# Patient Record
Sex: Male | Born: 1945 | Race: White | Hispanic: No | Marital: Married | Smoking: Former smoker
Health system: Southern US, Community
[De-identification: ages and names within clinical notes are randomized; demographics above are authoritative.]

## PROBLEM LIST (undated history)

## (undated) DIAGNOSIS — N183 Chronic kidney disease, stage 3 (moderate): Secondary | ICD-10-CM

## (undated) DIAGNOSIS — M069 Rheumatoid arthritis, unspecified: Secondary | ICD-10-CM

## (undated) DIAGNOSIS — I2510 Atherosclerotic heart disease of native coronary artery without angina pectoris: Secondary | ICD-10-CM

## (undated) DIAGNOSIS — Z8719 Personal history of other diseases of the digestive system: Secondary | ICD-10-CM

## (undated) DIAGNOSIS — R7303 Prediabetes: Secondary | ICD-10-CM

## (undated) DIAGNOSIS — I10 Essential (primary) hypertension: Secondary | ICD-10-CM

## (undated) DIAGNOSIS — Z972 Presence of dental prosthetic device (complete) (partial): Secondary | ICD-10-CM

## (undated) DIAGNOSIS — I440 Atrioventricular block, first degree: Secondary | ICD-10-CM

## (undated) DIAGNOSIS — E785 Hyperlipidemia, unspecified: Secondary | ICD-10-CM

## (undated) DIAGNOSIS — Z955 Presence of coronary angioplasty implant and graft: Secondary | ICD-10-CM

## (undated) DIAGNOSIS — I252 Old myocardial infarction: Secondary | ICD-10-CM

## (undated) DIAGNOSIS — I219 Acute myocardial infarction, unspecified: Secondary | ICD-10-CM

## (undated) DIAGNOSIS — R001 Bradycardia, unspecified: Secondary | ICD-10-CM

## (undated) DIAGNOSIS — N184 Chronic kidney disease, stage 4 (severe): Secondary | ICD-10-CM

## (undated) DIAGNOSIS — M1990 Unspecified osteoarthritis, unspecified site: Secondary | ICD-10-CM

## (undated) DIAGNOSIS — Z87442 Personal history of urinary calculi: Secondary | ICD-10-CM

## (undated) DIAGNOSIS — R17 Unspecified jaundice: Secondary | ICD-10-CM

## (undated) DIAGNOSIS — L4050 Arthropathic psoriasis, unspecified: Secondary | ICD-10-CM

## (undated) DIAGNOSIS — Z8711 Personal history of peptic ulcer disease: Secondary | ICD-10-CM

## (undated) DIAGNOSIS — C61 Malignant neoplasm of prostate: Secondary | ICD-10-CM

## (undated) HISTORY — PX: EYE SURGERY: SHX253

## (undated) HISTORY — PX: CARDIOVASCULAR STRESS TEST: SHX262

## (undated) HISTORY — DX: Chronic kidney disease, stage 3 (moderate): N18.3

## (undated) HISTORY — DX: Atherosclerotic heart disease of native coronary artery without angina pectoris: I25.10

## (undated) HISTORY — DX: Essential (primary) hypertension: I10

## (undated) HISTORY — DX: Hyperlipidemia, unspecified: E78.5

## (undated) HISTORY — DX: Bradycardia, unspecified: R00.1

## (undated) HISTORY — PX: COLONOSCOPY: SHX174

## (undated) SURGICAL SUPPLY — 2 items
WATCHMAN FLX PRO PROCEDURE (KITS) ×1 IMPLANT
WATCHMAN TRUSTEER PROCEDURE (KITS) ×1 IMPLANT

---

## 1997-12-16 HISTORY — PX: CORONARY ANGIOPLASTY WITH STENT PLACEMENT: SHX49

## 1998-11-14 ENCOUNTER — Encounter: Payer: Self-pay | Admitting: Cardiology

## 1998-11-15 ENCOUNTER — Inpatient Hospital Stay (HOSPITAL_COMMUNITY): Admission: AD | Admit: 1998-11-15 | Discharge: 1998-11-17 | Payer: Self-pay | Admitting: Cardiology

## 2002-06-04 ENCOUNTER — Encounter: Payer: Self-pay | Admitting: Family Medicine

## 2002-06-04 ENCOUNTER — Ambulatory Visit (HOSPITAL_COMMUNITY): Admission: RE | Admit: 2002-06-04 | Discharge: 2002-06-04 | Payer: Self-pay | Admitting: Family Medicine

## 2002-06-17 LAB — HM DIABETES EYE EXAM

## 2004-12-11 ENCOUNTER — Ambulatory Visit (HOSPITAL_COMMUNITY): Admission: RE | Admit: 2004-12-11 | Discharge: 2004-12-11 | Payer: Self-pay | Admitting: Family Medicine

## 2005-01-04 ENCOUNTER — Ambulatory Visit: Payer: Self-pay | Admitting: Cardiology

## 2005-12-20 ENCOUNTER — Ambulatory Visit: Payer: Self-pay | Admitting: Cardiology

## 2006-02-28 LAB — HM DIABETES EYE EXAM

## 2006-12-22 ENCOUNTER — Ambulatory Visit: Payer: Self-pay | Admitting: Cardiology

## 2007-12-25 ENCOUNTER — Ambulatory Visit: Payer: Self-pay | Admitting: Cardiology

## 2007-12-25 ENCOUNTER — Ambulatory Visit (HOSPITAL_COMMUNITY): Admission: RE | Admit: 2007-12-25 | Discharge: 2007-12-25 | Payer: Self-pay | Admitting: Cardiology

## 2009-01-05 ENCOUNTER — Ambulatory Visit: Payer: Self-pay | Admitting: Cardiology

## 2009-01-13 ENCOUNTER — Ambulatory Visit (HOSPITAL_COMMUNITY): Admission: RE | Admit: 2009-01-13 | Discharge: 2009-01-13 | Payer: Self-pay | Admitting: Cardiology

## 2009-04-14 ENCOUNTER — Encounter (INDEPENDENT_AMBULATORY_CARE_PROVIDER_SITE_OTHER): Payer: Self-pay | Admitting: *Deleted

## 2009-07-19 ENCOUNTER — Telehealth (INDEPENDENT_AMBULATORY_CARE_PROVIDER_SITE_OTHER): Payer: Self-pay

## 2009-09-08 ENCOUNTER — Ambulatory Visit (HOSPITAL_COMMUNITY): Admission: RE | Admit: 2009-09-08 | Discharge: 2009-09-08 | Payer: Self-pay | Admitting: General Surgery

## 2009-11-06 ENCOUNTER — Ambulatory Visit (HOSPITAL_COMMUNITY): Admission: RE | Admit: 2009-11-06 | Discharge: 2009-11-06 | Payer: Self-pay | Admitting: Nephrology

## 2009-11-22 ENCOUNTER — Ambulatory Visit (HOSPITAL_COMMUNITY): Admission: RE | Admit: 2009-11-22 | Discharge: 2009-11-22 | Payer: Self-pay | Admitting: Orthopedic Surgery

## 2009-11-28 ENCOUNTER — Encounter (INDEPENDENT_AMBULATORY_CARE_PROVIDER_SITE_OTHER): Payer: Self-pay | Admitting: *Deleted

## 2009-11-28 LAB — CONVERTED CEMR LAB
ALT: 59 units/L
AST: 15 units/L
Albumin: 3.5 g/dL
Alkaline Phosphatase: 103 units/L
BUN: 18 mg/dL
CO2: 26 meq/L
Calcium: 8.8 mg/dL
Chloride: 100 meq/L
Creatinine, Ser: 1.61 mg/dL
Glucose, Bld: 123 mg/dL
Potassium: 4.9 meq/L
Sodium: 140 meq/L
Total Protein: 6.9 g/dL

## 2009-12-07 ENCOUNTER — Encounter: Payer: Self-pay | Admitting: Cardiology

## 2010-01-02 ENCOUNTER — Encounter (INDEPENDENT_AMBULATORY_CARE_PROVIDER_SITE_OTHER): Payer: Self-pay | Admitting: *Deleted

## 2010-01-08 ENCOUNTER — Encounter: Payer: Self-pay | Admitting: Cardiology

## 2010-01-08 DIAGNOSIS — E785 Hyperlipidemia, unspecified: Secondary | ICD-10-CM | POA: Insufficient documentation

## 2010-01-08 DIAGNOSIS — E782 Mixed hyperlipidemia: Secondary | ICD-10-CM | POA: Insufficient documentation

## 2010-01-08 DIAGNOSIS — I10 Essential (primary) hypertension: Secondary | ICD-10-CM | POA: Insufficient documentation

## 2010-01-09 ENCOUNTER — Encounter (INDEPENDENT_AMBULATORY_CARE_PROVIDER_SITE_OTHER): Payer: Self-pay | Admitting: *Deleted

## 2010-01-12 ENCOUNTER — Ambulatory Visit: Payer: Self-pay | Admitting: Cardiology

## 2010-01-12 DIAGNOSIS — D649 Anemia, unspecified: Secondary | ICD-10-CM | POA: Insufficient documentation

## 2010-01-12 DIAGNOSIS — I2510 Atherosclerotic heart disease of native coronary artery without angina pectoris: Secondary | ICD-10-CM | POA: Insufficient documentation

## 2010-01-12 DIAGNOSIS — I495 Sick sinus syndrome: Secondary | ICD-10-CM | POA: Insufficient documentation

## 2010-01-25 ENCOUNTER — Encounter (INDEPENDENT_AMBULATORY_CARE_PROVIDER_SITE_OTHER): Payer: Self-pay | Admitting: *Deleted

## 2010-02-02 ENCOUNTER — Ambulatory Visit (HOSPITAL_COMMUNITY): Admission: RE | Admit: 2010-02-02 | Discharge: 2010-02-02 | Payer: Self-pay | Admitting: Rheumatology

## 2010-06-28 ENCOUNTER — Telehealth (INDEPENDENT_AMBULATORY_CARE_PROVIDER_SITE_OTHER): Payer: Self-pay | Admitting: *Deleted

## 2010-10-02 DIAGNOSIS — I25810 Atherosclerosis of coronary artery bypass graft(s) without angina pectoris: Secondary | ICD-10-CM | POA: Insufficient documentation

## 2010-12-14 ENCOUNTER — Encounter (INDEPENDENT_AMBULATORY_CARE_PROVIDER_SITE_OTHER): Payer: Self-pay | Admitting: *Deleted

## 2010-12-14 LAB — CONVERTED CEMR LAB
ALT: 30 units/L
AST: 20 units/L
Albumin: 3.6 g/dL
Basophils Absolute: 0 10*3/uL
Basophils Relative: 0 %
Creatinine, Ser: 1.64 mg/dL
Eosinophils Absolute: 0.5 10*3/uL
Eosinophils Relative: 7 %
HCT: 41.3 %
Hemoglobin: 14 g/dL
Lymphocytes Relative: 26 %
Lymphs Abs: 1.9 10*3/uL
MCHC: 33.9 g/dL
MCV: 93 fL
Monocytes Absolute: 1 10*3/uL
Monocytes Relative: 14 %
Platelets: 201 10*3/uL
RBC: 4.44 M/uL
RDW: 13 %
WBC: 7.2 10*3/uL

## 2010-12-31 ENCOUNTER — Encounter (INDEPENDENT_AMBULATORY_CARE_PROVIDER_SITE_OTHER): Payer: Self-pay | Admitting: *Deleted

## 2011-01-04 ENCOUNTER — Ambulatory Visit
Admission: RE | Admit: 2011-01-04 | Discharge: 2011-01-04 | Payer: Self-pay | Source: Home / Self Care | Attending: Cardiology | Admitting: Cardiology

## 2011-01-04 DIAGNOSIS — M069 Rheumatoid arthritis, unspecified: Secondary | ICD-10-CM | POA: Insufficient documentation

## 2011-01-05 ENCOUNTER — Encounter: Payer: Self-pay | Admitting: Cardiology

## 2011-01-05 LAB — CONVERTED CEMR LAB
ALT: 26 units/L (ref 0–53)
AST: 22 units/L (ref 0–37)
Albumin: 3.8 g/dL (ref 3.5–5.2)
Alkaline Phosphatase: 80 units/L (ref 39–117)
Bilirubin, Direct: 0.1 mg/dL (ref 0.0–0.3)
Cholesterol: 161 mg/dL (ref 0–200)
HDL: 36 mg/dL — ABNORMAL LOW (ref >39)
Indirect Bilirubin: 0.5 mg/dL (ref 0.0–0.9)
LDL Cholesterol: 81 mg/dL (ref 0–99)
Total Bilirubin: 0.6 mg/dL (ref 0.3–1.2)
Total CHOL/HDL Ratio: 4.5
Total Protein: 7.3 g/dL (ref 6.0–8.3)
Triglycerides: 219 mg/dL — ABNORMAL HIGH (ref <150)
VLDL: 44 mg/dL — ABNORMAL HIGH (ref 0–40)

## 2011-01-06 ENCOUNTER — Encounter: Payer: Self-pay | Admitting: Nephrology

## 2011-01-08 ENCOUNTER — Encounter (INDEPENDENT_AMBULATORY_CARE_PROVIDER_SITE_OTHER): Payer: Self-pay | Admitting: *Deleted

## 2011-01-15 NOTE — Assessment & Plan Note (Signed)
Summary: 1 YR F/UPER CHECKOUT ON 01/05/09/TG   Visit Type:  Follow-up Primary Provider:  Dr. Orson Ape   History of Present Illness: Jacob Farmer returns to the office as scheduled for continued assessment and treatment of coronary artery disease, now 20 years following initial presentation with myocardial infarction and 10 years after 2 vessel PCI with drug-eluting stents to the circumflex and RCA.  He continues to do very well with a fairly sedentary lifestyle, but no exercise related symptoms, no dyspnea, no chest discomfort, no syncope and no edema.  Unfortunately, he has had multiple medical issues in recent months including progression of chronic renal disease for which he now sees Dr. Lowanda Foster, a flare of arthritic discomfort when nonsteroidal agents were discontinued and now anemia that has developed over the past 2 months.  He was treated with prednisone for a period of time, but that was discontinued along with his aspirin.  He is also being followed by Dr. Estanislado Pandy for his rheumatologic problems.  Hemoglobin was normal in December, but 10.6 on 12/21/09.  MCV is normal.  Stool samples for Hemoccult testing had been obtained, but I am unaware of exactly how much evaluation has been performed and who is directing the assessment of this problem.  Has had a recent nonproductive cough, that has improved.  He denies melena, hematemesis or other apparent avenues for blood loss.  Current Medications (verified): 1)  Metoprolol Tartrate 50 Mg Tabs (Metoprolol Tartrate) .... Take 1 Tablet By Mouth Two Times A Day 2)  Simvastatin 40 Mg Tabs (Simvastatin) .... Take 1 Tab Daily 3)  Oxycodone-Acetaminophen 5-500 Mg Caps (Oxycodone-Acetaminophen) .... Take As Directed 4)  Ra Fish Oil 1000 Mg Caps (Omega-3 Fatty Acids) .... Take 1 Tab Two Times A Day 5)  Sulfasalazine 500 Mg Tabs (Sulfasalazine) .... Take 2 Tabs Two Times A Day  Allergies (verified): No Known Drug Allergies  Past History:  PMH, FH,  and Social History reviewed and updated.  Review of Systems       The patient complains of prolonged cough.  The patient denies fever, weight loss, weight gain, vision loss, decreased hearing, hoarseness, chest pain, syncope, dyspnea on exertion, peripheral edema, headaches, hemoptysis, abdominal pain, melena, and hematochezia.         See also history of present illness.  Vital Signs:  Patient profile:   65 year old male Height:      70 inches Weight:      182 pounds BMI:     26.21 Pulse rate:   75 / minute BP sitting:   142 / 81  (right arm)  Vitals Entered By: Doretha Sou, CNA (January 12, 2010 1:22 PM)  Physical Exam  General:   General-Well developed; no acute distress:   Neck-No JVD; no carotid bruits: Lungs-No tachypnea, no rales; no rhonchi; no wheezes: Cardiovascular-normal PMI; normal S1 and S2: Abdomen-BS normal; soft and non-tender without masses or organomegaly:  Musculoskeletal-No deformities, no cyanosis or clubbing; full range of motion in all joints without apparent effusions. 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  Ongoing treatment will focus on optimization of control of cardiovascular risk factors.  Problem # 2:  HYPERLIPIDEMIA (B2193296.4) We are collecting recent laboratory results from his primary care physician, nephrologist and rheumatologist.  Once the results of the lipid profile are  in hand, adjustment of his therapy will be undertaken as needed.  Problem # 3:  HYPERTENSION (ICD-401.9) Blood pressure control has been good; current medications will be continued.  Problem # 4:  CHRONIC KIDNEY DISEASE STAGE III (ICD-585.1) Renal function is stable and is being followed by Dr. Lowanda Foster.  Problem # 5:   ANEMIA (ICD-285.9) Onset is too sudden to be the result of his fairly mild chronic renal disease.  It appears that appropriate testing is being done-I will attempt to locate this and will otherwise not  become involved in this noncardiac issue.  I plan to reassess this nice gentleman in 6 months to be certain that aspirin has been resumed and that all other issues are stable.  Patient Instructions: 1)  Your physician recommends that you schedule a follow-up appointment in: 6 months Prescriptions: METOPROLOL TARTRATE 50 MG TABS (METOPROLOL TARTRATE) Take 1 tablet by mouth two times a day  #180 x 3   Entered by:   Tye Savoy RN   Authorized by:   Yehuda Savannah, MD, [facility]   Signed by:   Tye Savoy RN on 01/12/2010   Method used:   Electronically to        [facility] [address] (retail)       [address]       [place], [place]  [address]       Ph: [phone]       Fax: [phone]   RxID:   [id number]

## 2011-01-15 NOTE — Progress Notes (Signed)
Summary: NEEDS TO TALK WITH NURSE  Phone Note Call from Patient Call back at Home Phone [phone]   Caller: PT WIFE Reason for Call: Talk to Nurse Summary of Call: PER PT WIFE PT IS NOT GOING TO SEE DR ROTHBART EVERY SIX MONTH ONLY EVERY YEAR HE HAS OTHER DOCTORS VISITS IN BETWEEN WITH [facility] AND HAS LABS DONE. THEY ARE NEEDING HIS RX SIMVASTATIN CALLED IN TO [facility] FOR 3 MONTHS NOT FOR ONE MONTH.  AGAIN SHE STATES THAT HE WILL NOT COME FOR 6 MON ONLY FOR 1 YEAR F/U. Initial call taken by: Nevada Crane,  June 28, 2010 11:20 AM  Follow-up for Phone Call        Pt sees numerous specialists and will only come to see the cardiologist on an annual basis Follow-up by: Tye Savoy RN,  June 28, 2010 5:01 PM    Prescriptions: SIMVASTATIN 40 MG TABS (SIMVASTATIN) TAKE 1 TAB DAILY  #90 x 3   Entered by:   Tye Savoy RN   Authorized by:   Yehuda Savannah, MD, [facility]   Signed by:   Tye Savoy RN on 06/28/2010   Method used:   Electronically to        [facility] [phone]* (retail)       [address]       [place], [place]  [address]       Ph: [phone] or [phone]       Fax: [phone]   RxID:   [id number]

## 2011-01-15 NOTE — Miscellaneous (Signed)
Summary: LABS CMP,11/28/2009  Clinical Lists Changes  Observations: Added new observation of CALCIUM: 8.8 mg/dL (11/28/2009 9:15) Added new observation of ALBUMIN: 3.5 g/dL (11/28/2009 9:15) Added new observation of PROTEIN, TOT: 6.9 g/dL (11/28/2009 9:15) Added new observation of SGPT (ALT): 59 units/L (11/28/2009 9:15) Added new observation of SGOT (AST): 15 units/L (11/28/2009 9:15) Added new observation of ALK PHOS: 103 units/L (11/28/2009 9:15) Added new observation of CREATININE: 1.61 mg/dL (11/28/2009 9:15) Added new observation of BUN: 18 mg/dL (11/28/2009 9:15) Added new observation of BG RANDOM: 123 mg/dL (11/28/2009 9:15) Added new observation of CO2 PLSM/SER: 26 meq/L (11/28/2009 9:15) Added new observation of CL SERUM: 100 meq/L (11/28/2009 9:15) Added new observation of K SERUM: 4.9 meq/L (11/28/2009 9:15) Added new observation of NA: 140 meq/L (11/28/2009 9:15)

## 2011-01-15 NOTE — Letter (Signed)
Summary: Generic Letter, Intro to Referring  [facility]  [address]   [place], [place] [address]   Phone: [phone]  Fax: [phone]      January 25, 2010             RE: [name]   Aug 30, 1946                 [place], [place]  [address]  Dear, Appt Scheduler  You referred this patient to our office for a consult. We have tried to reach Mr Leverington by phone and mail. He has failed to contact us to schedule an appointment.    Sincerely,  [facility] Ph: [phone]   Fax: [phone]

## 2011-01-15 NOTE — Progress Notes (Signed)
**Note De-Identified Reyansh Kushnir Obfuscation** Summary: Labs  Phone Note Call from Patient Call back at Home Phone [phone]   Caller: Jacob Farmer Reason for Call: Talk to Nurse Summary of Call: PT WIFE CALLING WANT TO KNOW ABOUT LABS WORK NEEDING DONE. Initial call taken by: Nevada Crane,  July 19, 2009 10:01 AM  Follow-up for Phone Call        Patient's wife, Jacob Farmer, states that her husband recieved a lab order in the mail 2 days ago with Jacob Farmer name on it as the ordering provider. Mrs. Opheim states that she spoke with someone in our office and that from now on Jacob Farmer would like to only be seen by Jacob Farmer for his cardiac needs.  She also stated that her husband had labs drawn in January and April of this year. I explained to her that Jacob Farmer ordered these additional labs before he left this office because of Jacob Farmer lab results. Mrs. Farmer said that Jacob Farmer has a follow up here in January with Jacob Farmer and that if he wants these labs at that time the patient will have them drawn then.  Follow-up by: Jeani Hawking Shakeria Robinette LPN,  August  4, 624THL 4:41 PM

## 2011-01-15 NOTE — Letter (Signed)
Summary: Appointment Reminder  [facility]  [address]   [place], [place] [address]   Phone: [phone]  Fax: [phone]       January 02, 2010   Jacob Farmer [address] [place], [place]  [address] 09-03-1946    Dear Mr. VANWIE,  We have been unable to reach you by phone to schedule a follow up   appointment that was recommended for you by Dr. Oneida Alar. It is very   important that we reach you to schedule an appointment. We hope that you  allow [place] to participate in your health care needs. Please contact us at  [phone] at your earliest convenience to schedule your appointment.  Sincerely,    [facility] R. Garfield Cornea, M.D.    Caro Hight, M.D. Vickey Huger, FNP-BC    Neil Crouch, PA-C Phone: [phone]    Fax: [phone]

## 2011-01-15 NOTE — Miscellaneous (Signed)
Summary: refill  Clinical Lists Changes  Medications: Added new medication of LISINOPRIL 20 MG TABS (LISINOPRIL) Take one tablet by mouth daily - Signed Rx of LISINOPRIL 20 MG TABS (LISINOPRIL) Take one tablet by mouth daily;  #30 x 11;  Signed;  Entered by: Doretha Sou, CNA;  Authorized by: Yehuda Savannah, MD, [facility];  Method used: Electronically to [facility] [address], [place], [place], [place]  [address], Ph: [phone], Fax: [phone]    Prescriptions: LISINOPRIL 20 MG TABS (LISINOPRIL) Take one tablet by mouth daily  #30 x 11   Entered by:   Doretha Sou, CNA   Authorized by:   Yehuda Savannah, MD, [facility]   Signed by:   Doretha Sou, CNA on 04/14/2009   Method used:   Electronically to        [facility]  [address] (retail)       [address]       [place], [place]  [address]       Ph: [phone]       Fax: [phone]   RxID:   [id number]

## 2011-01-15 NOTE — Miscellaneous (Signed)
Summary: metoprolol refill  Clinical Lists Changes  Medications: Changed medication from METOPROLOL TARTRATE 50 MG TABS (METOPROLOL TARTRATE) to METOPROLOL TARTRATE 50 MG TABS (METOPROLOL TARTRATE) Take 1 tablet by mouth two times a day

## 2011-01-15 NOTE — Letter (Signed)
Summary: progress notes  progress notes   Imported By: Nevada Crane 01/26/2010 08:33:59  _____________________________________________________________________  External Attachment:    Type:   Image     Comment:   External Document

## 2011-01-17 NOTE — Letter (Signed)
Summary: [facility], general practice at [place]. [address]  [address]   Phone: [phone]  Fax: [phone]      January 08, 2011 MRN: [id number]   Jacob Farmer [address] [place],   [address]   Dear [name],  Your test ordered by Rande Lawman has been reviewed by your physician (or physician assistant) and was found to be normal or stable. Your physician (or physician assistant) felt no changes were needed at this time.  ____ Echocardiogram  ____ Cardiac Stress Test  _x___ Lab Work  ____ Peripheral vascular study of arms, legs or neck  ____ CT scan or X-ray  ____ Lung or Breathing test  ____ Other:  No change in medical treatment at this time, per Dr. Lattie Haw.  Enclosed is a copy of your labwork for your records.  Thank you, Aleshia Cartelli [facility] RN    Jacqulyn Ducking, MD, [facility] Gamer.C.Renella Cunas, MD, F.A.C.C Cristopher Peru, MD, F.A.C.C Rozann Lesches, MD, F.A.C.C Jenkins Rouge, MD, [facility] Gamer.C.C

## 2011-01-17 NOTE — Miscellaneous (Signed)
Summary: LABS CBCD 12/14/2010  Clinical Lists Changes  Observations: Added new observation of ALBUMIN: 3.6 g/dL (12/14/2010 15:08) Added new observation of SGPT (ALT): 30 units/L (12/14/2010 15:08) Added new observation of SGOT (AST): 20 units/L (12/14/2010 15:08) Added new observation of CREATININE: 1.64 mg/dL (12/14/2010 15:08) Added new observation of ABSOLUTE BAS: 0.0 K/uL (12/14/2010 15:08) Added new observation of BASOPHIL %: 0 % (12/14/2010 15:08) Added new observation of EOS ABSLT: 0.5 K/uL (12/14/2010 15:08) Added new observation of % EOS AUTO: 7 % (12/14/2010 15:08) Added new observation of ABSOLUTE MON: 1.0 K/uL (12/14/2010 15:08) Added new observation of MONOCYTE %: 14 % (12/14/2010 15:08) Added new observation of ABS LYMPHOCY: 1.9 K/uL (12/14/2010 15:08) Added new observation of LYMPHS %: 26 % (12/14/2010 15:08) Added new observation of PLATELETK/UL: 201 K/uL (12/14/2010 15:08) Added new observation of RDW: 13.0 % (12/14/2010 15:08) Added new observation of MCHC RBC: 33.9 g/dL (12/14/2010 15:08) Added new observation of MCV: 93.0 fL (12/14/2010 15:08) Added new observation of HCT: 41.3 % (12/14/2010 15:08) Added new observation of HGB: 14.0 g/dL (12/14/2010 15:08) Added new observation of RBC M/UL: 4.44 M/uL (12/14/2010 15:08) Added new observation of WBC COUNT: 7.2 10*3/microliter (12/14/2010 15:08)

## 2011-01-17 NOTE — Assessment & Plan Note (Addendum)
Summary: F1Y   Visit Type:  Follow-up Primary Provider:  Dr. Orson Ape   History of Present Illness: JacobJacob Farmer is a 65 y/o CM we are following for treatment and assessment of CAD with known history of MI in 1991, PCI using DES to Cx and RCA.  He has since been diagnosed with Rheumatoid arthritis and is being followed by Dr. Estanislado Pandy for this and started on Humira injections every two weeks. She follows labs for this monthly.  He was also diagnosed with renal insufficiency in the setting of antinflammatory medcations he was taking for arthritis and is being followed by Dr. Lowanda Foster.  There was a question of anemia in this setting but it has resolved. He had a colonoscopy and EDG 6 months ago and it was found to be normal without evidence of ulcerations or bleeding.  He is without cardiac complaint today.  He continues to work and be active.  He has gain 20lbs since being seen last.  He denies any symptoms of DOE or Chest discomfort.     Current Medications (verified): 1)  Metoprolol Tartrate 50 Mg Tabs (Metoprolol Tartrate) .... Take 1 Tablet By Mouth Two Times A Day 2)  Simvastatin 40 Mg Tabs (Simvastatin) .... Take 1 Tab Daily 3)  Ra Fish Oil 1000 Mg Caps (Omega-3 Fatty Acids) .... Take 1 Tab Two Times A Day 4)  Sulfasalazine 500 Mg Tabs (Sulfasalazine) .... Take 2 Tabs Two Times A Day 5)  Humira 40 Mg/0.30ml Kit (Adalimumab) .Marland Kitchen.. 1 Injection Every 2 Weeks Dr.deviswar 6)  Aspir-Low 81 Mg Tbec (Aspirin) .... Take 1 Tab Daily 7)  Norvasc 2.5 Mg Tabs (Amlodipine Besylate) .... Take 1 Tablet By Mouth Once Daily  Allergies (verified): No Known Drug Allergies  Comments:  Nurse/Medical Assistant: patient brought med list patient [facility] [place]  Past History:  Past medical, surgical, family and social histories (including risk factors) reviewed, and no changes noted (except as noted below).  Past Medical History: Reviewed history from 01/12/2010 and no changes  required. ASCVD: Acute myocardial infarction treated with TPA in 1990; 1999-stents to circumflex and RCA; residual total      occlusion of the left anterior descending; normal ejection fraction; stress nuclear in 2001-distal anteroseptal         ischemia; normal LV function Sinus bradycardia CHRONIC KIDNEY DISEASE STAGE III: Creatinine of 2.18 in 1/09 and 1.76 in 12/2008 HYPERLIPIDEMIA (ICD-272.4) HYPERTENSION (ICD-401.9) Tobacco abuse-discontinued in 1990  Past Surgical History: Reviewed history from 01/12/2010 and no changes required. None  Family History: Reviewed history from 01/12/2010 and no changes required. Mother died at age 73 due to myocardial infarction Father has history of coronary artery disease  Social History: Reviewed history from 01/12/2010 and no changes required. Retired from work as an Clinical biochemist Tobacco Use -remote Alcohol Use - 2-3 ounces per day Regular Exercise - no Drug Use - no Married with 2 children  Review of Systems       Kneee joint soreness  All other systems have been reviewed and are negative unless stated above.   Vital Signs:  Patient profile:   65 year old male Weight:      211 pounds BMI:     30.38 O2 Sat:      98 % on Room air Pulse rate:   56 / minute BP sitting:   152 / 73  (left arm)  Vitals Entered By: Doretha Sou, CNA (January 04, 2011 11:26 AM)  O2 Flow:  Room air  Physical  Exam  General:  Well developed, well nourished, in no acute distress. Head:  normocephalic and atraumatic Eyes:  PERRLA/EOM intact; conjunctiva and lids normal. Chest Wall:  no deformities or breast masses noted Lungs:  Clear bilaterally to auscultation and percussion. Heart:  Non-displaced PMI, chest non-tender; regular rate and rhythm, S1, S2 without murmurs, rubs or gallops. Carotid upstroke normal, no bruit. Normal abdominal aortic size, no bruits. Femorals normal pulses, no bruits. Pedals normal pulses. No edema, no varicosities. Abdomen:   Bowel sounds positive; abdomen soft and non-tender without masses, organomegaly, or hernias noted. No hepatosplenomegaly. Msk:  joint tenderness in knees bilaterally Pulses:  pulses normal in all 4 extremities Extremities:  No clubbing or cyanosis. Neurologic:  Alert and oriented x 3. Psych:  Normal affect.   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  No stress tests have been performed in about 5 years. He states that Dr. Lattie Haw determined that the scar tissue would be a factor in all future stress tests.  He is asymptomatic at this point.  He was also seen and examined by Dr. Lattie Haw on this clinic visit. We will refill his metoprolol.  Problem # 2:  HYPERTENSION (ICD-401.9) He is elevated on this visit for person with CAD.  Goal < 123456 systolic.  Review of prior BP demonstrated moderately controlled BP of142./81.  Will begin Norvasc 2.5 mg daily.  He is to take his BP at home and record it. He will see [place] in a month to report the results to the nurses. His updated medication list for this problem includes:    Metoprolol Tartrate 50 Mg Tabs (Metoprolol tartrate) .Marland Kitchen... Take 1 tablet by mouth two times a day    Aspir-low 81 Mg Tbec (Aspirin) .Marland Kitchen... Take 1 tab daily    Norvasc 2.5 Mg Tabs (Amlodipine besylate) .Marland Kitchen... Take 1 tablet by mouth once daily  Problem # 3:  HYPERLIPIDEMIA (ICD-272.4) Fasting lipids will be completed. His updated medication list for this problem includes:    Simvastatin 40 Mg Tabs (Simvastatin) .Marland Kitchen... Take 1 tab daily  Orders: T-Lipid Profile [id number]) T-Hepatic Function [id number])  Patient Instructions: 1)  Your physician recommends that you schedule a follow-up appointment in: 12 months 2)  Your physician recommends that you return for lab work in: next week 3)   Your physician has recommended you make the following change in your medication: start taking Norvasc 2.5mg  by mouth once daily  4)  Your physician has requested that you regularly monitor and record your blood pressure readings at home.  Please use the same machine at the same time of day to check your readings and record them to bring to your follow-up visit. 5)  ***Please drop your blood pressure diary off in the front office in  about 1 month *** Prescriptions: NORVASC 2.5 MG TABS (AMLODIPINE BESYLATE) take 1 tablet by mouth once daily  #90 x 3   Entered by:   Jeani Hawking Via LPN   Authorized by:   Yehuda Savannah, MD, [facility]   Signed by:   Jeani Hawking Via LPN on 075-GRM   Method used:   Print then Give to Patient   RxID:   [id number] METOPROLOL TARTRATE 50 MG TABS (METOPROLOL TARTRATE) Take 1 tablet by mouth two times a day  #180 x 3   Entered  by:   Jeani Hawking Via LPN   Authorized by:   Yehuda Savannah, MD, [facility]   Signed by:   Jeani Hawking Via LPN on 075-GRM   Method used:   Electronically to        [facility] (retail)       [address]       [place], [place]  [address]       Ph: [phone]       Fax: [phone]   RxID:   [id number]

## 2011-02-04 ENCOUNTER — Encounter: Payer: Self-pay | Admitting: Cardiology

## 2011-02-12 NOTE — Letter (Signed)
Summary: BP LOG  BP LOG   Imported By: Nevada Crane 02/04/2011 13:49:55  _____________________________________________________________________  External Attachment:    Type:   Image     Comment:   External Document

## 2011-03-22 LAB — BASIC METABOLIC PANEL
BUN: 28 mg/dL — ABNORMAL HIGH (ref 6–23)
CO2: 24 mEq/L (ref 19–32)
Calcium: 9 mg/dL (ref 8.4–10.5)
Chloride: 109 mEq/L (ref 96–112)
Creatinine, Ser: 2.12 mg/dL — ABNORMAL HIGH (ref 0.4–1.5)
GFR calc non Af Amer: 32 mL/min — ABNORMAL LOW (ref >60)
Glucose, Bld: 91 mg/dL (ref 70–99)
Potassium: 5.9 mEq/L — ABNORMAL HIGH (ref 3.5–5.1)
Sodium: 139 mEq/L (ref 135–145)

## 2011-03-22 LAB — POCT I-STAT 4, (NA,K, GLUC, HGB,HCT)
Glucose, Bld: 99 mg/dL (ref 70–99)
HCT: 38 % — ABNORMAL LOW (ref 39.0–52.0)
Hemoglobin: 12.9 g/dL — ABNORMAL LOW (ref 13.0–17.0)
Potassium: 5.4 mEq/L — ABNORMAL HIGH (ref 3.5–5.1)
Sodium: 139 mEq/L (ref 135–145)

## 2011-03-22 LAB — CBC
HCT: 34.2 % — ABNORMAL LOW (ref 39.0–52.0)
Hemoglobin: 11.7 g/dL — ABNORMAL LOW (ref 13.0–17.0)
MCHC: 34.3 g/dL (ref 30.0–36.0)
MCV: 91.4 fL (ref 78.0–100.0)
Platelets: 181 10*3/uL (ref 150–400)
RBC: 3.74 MIL/uL — ABNORMAL LOW (ref 4.22–5.81)
RDW: 13.1 % (ref 11.5–15.5)
WBC: 9.7 10*3/uL (ref 4.0–10.5)

## 2011-04-30 NOTE — Letter (Signed)
December 25, 2007    Leonides Grills, M.D.  [address] [place], [place] [address]   RE:  Jacob Farmer, Jacob Farmer  MRN:  [id number]  /  DOB:  1946-10-26   Dear Rush Landmark:   Jacob Farmer returns to the office as scheduled for continued assessment  and treatment of coronary disease and cardiovascular risk factors.  Since the last visit, he has done well.  He has not required any urgent  medical care.  He remains active around his house including cutting down  trees without cardiopulmonary symptoms.  He has had a chronic cough for  the past month that is nonproductive.  He has not noted fever nor  chills.  He has had mild rhinitis and no pharyngitis.  He notes some  wheezing at night when he retires, but no dyspnea, orthopnea nor PND.   CURRENT MEDICATIONS:  1. Aspirin 81 mg daily.  2. Metoprolol 50 mg b.i.d.  3. Fish oil 1000 mg b.i.d.  4. Lisinopril 20 mg daily.  5. Voltaren 75 mg daily.  6. Simvastatin 20 mg daily.  In the past, he appeared to be taking      more simvastatin.   PHYSICAL EXAMINATION:  GENERAL:  Pleasant gentleman in no acute  distress.  VITAL SIGNS:  The weight is 197, 6 pounds less than last year.  Blood  pressure 135/80, heart rate 66 and regular, respirations 16.  NECK:  No jugular venous distention; normal carotid upstrokes without  bruits.  LUNGS:  Minor end-expiratory wheezing.  CARDIAC:  Distant first and second heart sounds; fourth heart sound  present.  Normal PMI.  ABDOMEN:  Soft and nontender; no bruits; no organomegaly.  EXTREMITIES:  Distal pulses intact; no edema.   LABORATORY DATA:  Lipid profile from last year was somewhat suboptimal  with a total cholesterol of 173, HDL 45 and LDL of 97.  Chemistry  profile was normal.   IMPRESSION:  Jacob Farmer is doing generally well.  His chronic  respiratory symptoms are probably related to a bronchitis or URI that he  suffered a few weeks ago.  We will proceed with a chest x-ray.  A  chemistry profile will be  obtained.  He will increase simvastatin to 40  mg daily and obtain a lipid profile in 1 month.  I will see this nice  gentleman again in 1 year and manage any issues raised by his laboratory  studies via telephone.    Sincerely,      Cristopher Estimable. Lattie Haw, MD, [facility]  Electronically Signed    RMR/MedQ  DD: 12/25/2007  DT: 12/25/2007  Job #: [id number]

## 2011-04-30 NOTE — Assessment & Plan Note (Signed)
[facility] OFFICE NOTE   Jacob Farmer, Jacob Farmer                       MRN:          [id number]  DATE:01/05/2009                            DOB:          Dec 24, 1945    CARDIOLOGIST:  Cristopher Estimable. Lattie Haw, MD, [facility]   PRIMARY CARE PHYSICIAN:  Leonides Grills, MD   REASON FOR VISIT:  One-year followup.   HISTORY OF PRESENT ILLNESS:  Jacob Farmer is a 65 year old male patient  with a history of coronary artery disease, status post anterior wall  myocardial infarction, treated with TPA in 1990, who subsequently  underwent percutaneous coronary intervention with stenting of the  circumflex and RCA in 1999.  He has a known totally occluded LAD.  He  returns today for annual followup.  Overall, he is doing well without  any chest pain or short of breath.  Denies any orthopnea, PND, or pedal  edema.  Denies syncope, near-syncope, or palpitations.  He does note  some calf pain from time to time.  He has taken some over-the-counter  potassium supplement on occasion.  This has helped somewhat with his  symptoms.  His symptoms usually occur when he has been on his feet for  prolonged periods of time.   CURRENT MEDICATIONS:  1. Aspirin 81 mg daily.  2. Lopressor 50 mg b.i.d.  3. Fish oil 1 g b.i.d.  4. Lisinopril 20 mg daily.  5. Voltaren 75 mg b.i.d.  6. Simvastatin 40 mg nightly.  7. Nitroglycerin p.r.n. chest pain.   PHYSICAL EXAMINATION:  GENERAL:  He is a well-nourished and well-  developed male in no distress.  VITAL SIGNS:  Blood pressure is 129/65, pulse 44, and weight 194 pounds.  HEENT:  Normal.  NECK:  Without JVD.  CARDIAC:  Normal S1 and S2.  Regular rate and rhythm.  LUNGS:  Clear to auscultation bilaterally.  ABDOMEN:  Soft and nontender.  EXTREMITIES:  Without edema.  NEUROLOGIC:  He is alert and oriented x3.  Cranial nerves II through XII  are grossly intact.  VASCULAR:  No carotid bruits  noted bilaterally.  Distal pulses are  difficult to palpate.  Femoral artery pulses are difficult to palpate  without bruits bilaterally.   Electrocardiogram demonstrates sinus bradycardia with a heart rate of  45, normal axis, and no acute changes.   ASSESSMENT AND PLAN:  1. Coronary artery disease, status post anterior wall myocardial      infarction in 1990, treated with tPA and subsequent percutaneous      coronary intervention with stenting of the circumflex and right      coronary artery in 1999, and overall preserved left ventricular      function.  The patient is doing well without any anginal symptoms.      He is on a good medical regimen.  We will refill his nitroglycerin      for him today.  We will pursue annual followup.  2. Lower extremity pain.  The patient describes some symptoms that are      questionable for claudication.  He certainly is at risk for  peripheral vascular disease, given his coronary artery disease.  We      will proceed with ankle-brachial indices and arterial Dopplers to      rule-out significant peripheral arterial disease.  3. Dyslipidemia.  He continues on simvastatin 40 mg nightly.  We will      set him up for repeat fasting lipids and CMET.  4. Sinus bradycardia.  He is tolerating this well and is asymptomatic.      No medication changes will be made today.   DISPOSITION:  The patient will follow up with Dr. Lattie Haw in 1 year or  sooner p.r.n.  Of note, the patient was also interviewed and examined by  Dr. Lattie Haw today.      Richardson Dopp, PA-C  Electronically Signed      Cristopher Estimable. Lattie Haw, MD, [facility]  Electronically Signed   SW/MedQ  DD: 01/05/2009  DT: 01/06/2009  Job #: [id number]   cc:   Leonides Grills, M.D.

## 2011-05-03 NOTE — Letter (Signed)
December 22, 2006    Leonides Grills, M.D.  [address],  [place] [address]   RE:  THAD, DERICK  MRN:  [id number]  /  DOB:  18-Sep-1946   Dear Rush Landmark,   Mr. Kishbaugh returns to the office for continued assessment and treatment  of coronary disease and cardiovascular risk factors.  He has not had any  clinical symptomatology since the late 90s.  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  His blood pressure and  hyperlipidemia have been well-controlled.   CURRENT MEDICATIONS:  1. Aspirin 81 mg daily.  2. Metoprolol 50 mg b.i.d.  3. Fish oil 1000 mg b.i.d.  4. Lisinopril 20 mg daily.  5. Simvastatin 80 mg daily.  6. Voltaren 75 mg b.i.d.   PHYSICAL EXAMINATION:  GENERAL:  Pleasant gentleman in no acute  distress.  VITAL SIGNS:  The weight is 203 - stable.  Blood pressure 135/75, heart  rate 55 and regular, respirations 16.  NECK:  No jugular venous distension; normal carotid upstrokes without  bruits.  LUNGS:  Clear.  CARDIAC:  Distant first and second heart sounds.  ABDOMEN:  Soft and nontender; no bruit; no organomegaly; no masses;  aortic pulsation not palpable.  EXTREMITIES:  No edema; distal pulses intact.   IMPRESSION:  Mr. Throop is doing generally well.  He was mildly  bradycardic with metoprolol, but he is asymptomatic.  We will probably  need to reduce dosage at some point.  He has not routinely taken  pneumococcal vaccine nor influenza vaccine.  This can be deferred for a  few years.  We will recheck a chemistry profile and a lipid profile.  If  results are good, I will plan to see this nice gentleman again in one  year.    Sincerely,      Cristopher Estimable. Lattie Haw, MD, [facility]  Electronically Signed    RMR/MedQ  DD: 12/22/2006  DT: 12/22/2006  Job #: [id number]

## 2011-06-27 ENCOUNTER — Other Ambulatory Visit: Payer: Self-pay | Admitting: Cardiology

## 2011-06-28 ENCOUNTER — Other Ambulatory Visit: Payer: Self-pay

## 2011-06-28 MED ORDER — SIMVASTATIN 40 MG PO TABS: 40.0000 mg | ORAL_TABLET | Freq: Every day | ORAL | Status: DC

## 2011-12-16 ENCOUNTER — Encounter: Payer: Self-pay | Admitting: Cardiology

## 2011-12-27 ENCOUNTER — Encounter: Payer: Self-pay | Admitting: Cardiology

## 2011-12-27 ENCOUNTER — Ambulatory Visit (INDEPENDENT_AMBULATORY_CARE_PROVIDER_SITE_OTHER): Payer: BC Managed Care – PPO | Admitting: Cardiology

## 2011-12-27 DIAGNOSIS — I2510 Atherosclerotic heart disease of native coronary artery without angina pectoris: Secondary | ICD-10-CM

## 2011-12-27 DIAGNOSIS — E1100 Type 2 diabetes mellitus with hyperosmolarity without nonketotic hyperglycemic-hyperosmolar coma (NKHHC): Secondary | ICD-10-CM | POA: Insufficient documentation

## 2011-12-27 DIAGNOSIS — N183 Chronic kidney disease, stage 3 unspecified: Secondary | ICD-10-CM

## 2011-12-27 DIAGNOSIS — N1831 Chronic kidney disease, stage 3a: Secondary | ICD-10-CM | POA: Insufficient documentation

## 2011-12-27 DIAGNOSIS — E119 Type 2 diabetes mellitus without complications: Secondary | ICD-10-CM

## 2011-12-27 DIAGNOSIS — E1122 Type 2 diabetes mellitus with diabetic chronic kidney disease: Secondary | ICD-10-CM | POA: Insufficient documentation

## 2011-12-27 DIAGNOSIS — D649 Anemia, unspecified: Secondary | ICD-10-CM

## 2011-12-27 DIAGNOSIS — E785 Hyperlipidemia, unspecified: Secondary | ICD-10-CM

## 2011-12-27 DIAGNOSIS — I10 Essential (primary) hypertension: Secondary | ICD-10-CM

## 2011-12-27 DIAGNOSIS — N184 Chronic kidney disease, stage 4 (severe): Secondary | ICD-10-CM | POA: Insufficient documentation

## 2011-12-27 MED ORDER — LISINOPRIL-HYDROCHLOROTHIAZIDE 20-12.5 MG PO TABS: 1.0000 | ORAL_TABLET | Freq: Every day | ORAL | Status: DC

## 2011-12-27 MED ORDER — SIMVASTATIN 40 MG PO TABS: 40.0000 mg | ORAL_TABLET | Freq: Every day | ORAL | Status: DC

## 2011-12-27 MED ORDER — METOPROLOL TARTRATE 50 MG PO TABS: 50.0000 mg | ORAL_TABLET | Freq: Two times a day (BID) | ORAL | Status: DC

## 2011-12-27 NOTE — Progress Notes (Signed)
Patient ID: Jacob Farmer, male   DOB: 05/24/1946, 66 y.o.   MRN: [id number] HPI: Scheduled return visit for this very nice gentleman with coronary artery disease and multiple cardiovascular risk factors.  Since his last visit, he has continued to do extremely well with no cardiopulmonary symptoms.  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  He does not pursue regular physical activity, but denies exertional dyspnea, chest discomfort, lightheadedness, syncope, pedal edema, palpitations, claudication, orthopnea or PND.  He checks blood pressure occasionally in a pharmacy and has noted systolics above [id number].  Serum glucose and hemoglobin A1c have apparently been mildly elevated prompting initiation of pharmacologic therapy.  CBGs at home are typically less than 120, and hemoglobin A1c is reported to be less than 6.  Prior to Admission medications   Medication Sig Start Date End Date Taking? Authorizing Provider  adalimumab (HUMIRA) 40 MG/0.8ML injection Inject 40 mg into the skin every 14 (fourteen) days.     Yes Historical Provider, MD  amLODipine (NORVASC) 2.5 MG tablet Take 2.5 mg by mouth daily.     Yes Historical Provider, MD  aspirin 81 MG tablet Take 81 mg by mouth daily.     Yes Historical Provider, MD  fish oil-omega-3 fatty acids 1000 MG capsule Take 1 capsule by mouth 2 (two) times daily.     Yes Historical Provider, MD  glimepiride (AMARYL) 1 MG tablet Take 1 mg by mouth daily before breakfast.   Yes Historical Provider, MD  metoprolol (LOPRESSOR) 50 MG tablet Take 50 mg by mouth 2 (two) times daily.     Yes Historical Provider, MD  simvastatin (ZOCOR) 40 MG tablet Take 1 tablet (40 mg total) by mouth at bedtime. 06/28/11 06/27/12 Yes Cristopher Estimable. Lattie Haw, MD   No Known Allergies    Past medical history, social history, and family history reviewed and updated.  ROS: See history of present illness.  PHYSICAL  EXAM: BP 154/80  Pulse 52  Resp 16  Ht 5\' 10"  (1.778 m)  Wt 93.441 kg (206 lb)  BMI 29.56 kg/m2  General-Well developed; no acute distress Body habitus-proportionate weight and height Neck-No JVD; no carotid bruits Lungs-clear lung fields; resonant to percussion Cardiovascular-normal PMI; normal S1 and S2 Abdomen-normal bowel sounds; soft and non-tender without masses or organomegaly Musculoskeletal-No deformities, no cyanosis or clubbing Neurologic-Normal cranial nerves; symmetric strength and tone Skin-Warm, no significant lesions Extremities-1-2+ distal pulses; no edema  EKG: normal sinus rhythm, leftward axis, first-degree AV block, otherwise normal.  No previous tracings for comparison.  ASSESSMENT AND PLAN:  Jacqulyn Ducking, MD 12/27/2011 11:04 AM

## 2011-12-27 NOTE — Assessment & Plan Note (Addendum)
The patient has been asymptomatic with respect to coronary disease for at least the past 10 years.  We will continue to encourage him to be as active as possible and to control cardiovascular risk factors optimally.

## 2011-12-27 NOTE — Assessment & Plan Note (Signed)
Anemia appears to have resolved spontaneously.  Most recent CBC was normal in 11/2010.

## 2011-12-27 NOTE — Assessment & Plan Note (Signed)
With diabetes and renal insufficiency, treatment with ACE inhibitor or ARB may be useful.  Lisinopril will be added for better control blood pressure and preservation of renal function.

## 2011-12-27 NOTE — Assessment & Plan Note (Signed)
Control of lipids is excellent; current therapy will be continued.

## 2011-12-27 NOTE — Assessment & Plan Note (Addendum)
Control of hypertension is slightly suboptimal.  Lisinopril/HCT 20/12.5 mg will be added to his medical regime with monitoring of renal function and electrolytes.

## 2011-12-27 NOTE — Patient Instructions (Signed)
Your physician recommends that you schedule a follow-up appointment in: 1 year  Your physician has recommended you make the following change in your medication:  1 - STOP Amlodipine 2 - START Lisinopril/HCT 20/12.5 mg daily  Your physician recommends that you return for lab work in: 1 month and you will receive a letter in the mail

## 2011-12-31 ENCOUNTER — Encounter: Payer: Self-pay | Admitting: *Deleted

## 2011-12-31 ENCOUNTER — Other Ambulatory Visit: Payer: Self-pay | Admitting: *Deleted

## 2012-01-01 ENCOUNTER — Telehealth: Payer: Self-pay | Admitting: Cardiology

## 2012-01-01 NOTE — Telephone Encounter (Signed)
WIFE STOPPED IN TO LET us KNOW THAT THE KIDNEY DOCTOR TOOK PT OFF LISINOPRIL.  ALSO NEED TO KNOW IF HE CAN SWITCH FROM SIMVASTATIN TO A GEN FROM [facility] DUE TO COST([facility] SELECTION IS LOVASTATIN ANS PRAVASTATIN.)  CELL PHONE IS [phone]

## 2012-01-02 ENCOUNTER — Other Ambulatory Visit: Payer: Self-pay | Admitting: *Deleted

## 2012-01-02 ENCOUNTER — Encounter: Payer: Self-pay | Admitting: *Deleted

## 2012-01-02 DIAGNOSIS — E782 Mixed hyperlipidemia: Secondary | ICD-10-CM

## 2012-01-02 DIAGNOSIS — I10 Essential (primary) hypertension: Secondary | ICD-10-CM

## 2012-01-02 MED ORDER — PRAVASTATIN SODIUM 80 MG PO TABS: 80.0000 mg | ORAL_TABLET | Freq: Every day | ORAL | Status: DC

## 2012-01-02 NOTE — Telephone Encounter (Signed)
Monitor home blood pressures; call for significant elevations Change simvastatin to pravastatin 80 mg per day. BMet and fasting lipid profile in 2 months.

## 2012-01-02 NOTE — Telephone Encounter (Signed)
Wife notified of Dr Izell [place] recommendations.

## 2012-01-03 ENCOUNTER — Telehealth: Payer: Self-pay | Admitting: Cardiology

## 2012-01-03 NOTE — Telephone Encounter (Signed)
Jacob Farmer asking about change in medication from [facility] 40mg , and now has been given Pravachol 80mg .  There is a significant price different.  This comes in 10, 20 and 40 mg for a 90 days fpr $10.  Can this be changed?

## 2012-01-05 NOTE — Telephone Encounter (Signed)
Please determine from insurer which statins can be provided at a cost similar to simvastatin.  If pravastatin costs $10 for a 3 month supply of 40mg  tabs then 80mg  per day should cost $20 for 3 months.  That should be doable.

## 2012-01-06 ENCOUNTER — Other Ambulatory Visit: Payer: Self-pay | Admitting: *Deleted

## 2012-01-06 ENCOUNTER — Telehealth: Payer: Self-pay | Admitting: *Deleted

## 2012-01-06 MED ORDER — PRAVASTATIN SODIUM 40 MG PO TABS: 80.0000 mg | ORAL_TABLET | Freq: Every day | ORAL | Status: DC

## 2012-01-06 NOTE — Telephone Encounter (Signed)
Advised wife that a script was sent for 40 mg tablets 2 tablets at bed time.

## 2012-01-10 LAB — HM DIABETES EYE EXAM

## 2012-01-21 ENCOUNTER — Encounter: Payer: Self-pay | Admitting: Cardiology

## 2012-02-05 ENCOUNTER — Other Ambulatory Visit: Payer: Self-pay | Admitting: *Deleted

## 2012-02-05 DIAGNOSIS — I10 Essential (primary) hypertension: Secondary | ICD-10-CM

## 2012-02-05 DIAGNOSIS — E782 Mixed hyperlipidemia: Secondary | ICD-10-CM

## 2012-02-15 ENCOUNTER — Other Ambulatory Visit: Payer: Self-pay | Admitting: Cardiology

## 2012-02-15 LAB — BASIC METABOLIC PANEL
BUN: 21 mg/dL (ref 6–23)
CO2: 26 mEq/L (ref 19–32)
Calcium: 9.4 mg/dL (ref 8.4–10.5)
Chloride: 104 mEq/L (ref 96–112)
Creat: 1.57 mg/dL — ABNORMAL HIGH (ref 0.50–1.35)
Glucose, Bld: 97 mg/dL (ref 70–99)
Potassium: 4.7 mEq/L (ref 3.5–5.3)
Sodium: 140 mEq/L (ref 135–145)

## 2012-02-17 ENCOUNTER — Encounter: Payer: Self-pay | Admitting: *Deleted

## 2012-02-21 ENCOUNTER — Other Ambulatory Visit: Payer: Self-pay | Admitting: *Deleted

## 2012-02-21 DIAGNOSIS — E782 Mixed hyperlipidemia: Secondary | ICD-10-CM

## 2012-02-21 DIAGNOSIS — I10 Essential (primary) hypertension: Secondary | ICD-10-CM

## 2012-03-02 ENCOUNTER — Encounter: Payer: Self-pay | Admitting: *Deleted

## 2012-06-27 ENCOUNTER — Other Ambulatory Visit: Payer: Self-pay | Admitting: Cardiology

## 2012-12-25 ENCOUNTER — Ambulatory Visit: Payer: BC Managed Care – PPO | Admitting: Cardiology

## 2013-01-13 ENCOUNTER — Ambulatory Visit: Payer: BC Managed Care – PPO | Admitting: Cardiology

## 2013-01-22 ENCOUNTER — Ambulatory Visit: Payer: BC Managed Care – PPO | Admitting: Cardiology

## 2013-04-02 ENCOUNTER — Other Ambulatory Visit: Payer: Self-pay | Admitting: Cardiology

## 2013-04-02 ENCOUNTER — Encounter: Payer: Self-pay | Admitting: Cardiology

## 2013-04-02 ENCOUNTER — Ambulatory Visit (INDEPENDENT_AMBULATORY_CARE_PROVIDER_SITE_OTHER): Payer: BC Managed Care – PPO | Admitting: Cardiology

## 2013-04-02 VITALS — BP 150/76 | HR 52 | Ht 70.0 in | Wt 212.0 lb

## 2013-04-02 DIAGNOSIS — I7090 Unspecified atherosclerosis: Secondary | ICD-10-CM

## 2013-04-02 DIAGNOSIS — N183 Chronic kidney disease, stage 3 unspecified: Secondary | ICD-10-CM

## 2013-04-02 DIAGNOSIS — E119 Type 2 diabetes mellitus without complications: Secondary | ICD-10-CM

## 2013-04-02 DIAGNOSIS — I2510 Atherosclerotic heart disease of native coronary artery without angina pectoris: Secondary | ICD-10-CM

## 2013-04-02 DIAGNOSIS — E785 Hyperlipidemia, unspecified: Secondary | ICD-10-CM

## 2013-04-02 DIAGNOSIS — I495 Sick sinus syndrome: Secondary | ICD-10-CM

## 2013-04-02 DIAGNOSIS — M069 Rheumatoid arthritis, unspecified: Secondary | ICD-10-CM

## 2013-04-02 DIAGNOSIS — I10 Essential (primary) hypertension: Secondary | ICD-10-CM

## 2013-04-02 MED ORDER — AMLODIPINE BESYLATE 5 MG PO TABS: 5.0000 mg | ORAL_TABLET | Freq: Every day | ORAL | Status: DC

## 2013-04-02 MED ORDER — LISINOPRIL 5 MG PO TABS: 5.0000 mg | ORAL_TABLET | Freq: Every day | ORAL | Status: DC

## 2013-04-02 NOTE — Assessment & Plan Note (Signed)
A urinalysis and measurement of microalbumin will be performed. Assuming increased albumin excretion is present, lisinopril will be added to patient's medical regime at low dose.

## 2013-04-02 NOTE — Assessment & Plan Note (Signed)
Blood pressure control is suboptimal, and renal disease would likely benefit from addition of an Ace inhibitor or ARB. Dose of amlodipine will be increased to 5 mg per day. Lisinopril will be added at a dose of 5 mg per day and renal function and electrolytes monitored. A 24-hour ambulatory blood pressure monitor will be provided once therapy has stabilized.

## 2013-04-02 NOTE — Progress Notes (Deleted)
Name: Jacob Farmer    DOB: [birth year]  Age: 67 y.o.  MR#: [id number]       PCP:  Leonides Grills, MD      Insurance: Payor: BLUE [employer]UE [employer]IELD  Plan: BCBS  PPO  Product Type: *No Product type*    CC:   No chief complaint on file. LIST   VS Filed Vitals:   04/02/13 1114  BP: 150/76  Pulse: 52  Height: 5\' 10"  (1.778 m)  Weight: 212 lb (96.163 kg)  SpO2: 99%    Weights Current Weight  04/02/13 212 lb (96.163 kg)  12/27/11 206 lb (93.441 kg)  01/04/11 211 lb (95.709 kg)    Blood Pressure  BP Readings from Last 3 Encounters:  04/02/13 150/76  12/27/11 154/80  01/04/11 152/73     Admit date:  (Not on file) Last encounter with RMR:  01/22/2013   Allergy Review of patient's allergies indicates no known allergies.  Current Outpatient Prescriptions  Medication Sig Dispense Refill  . adalimumab (HUMIRA) 40 MG/0.8ML injection Inject 40 mg into the skin every 14 (fourteen) days.        Marland Kitchen amLODipine (NORVASC) 2.5 MG tablet Take 2.5 mg by mouth daily.       Marland Kitchen aspirin 81 MG tablet Take 81 mg by mouth every other day.       . fish oil-omega-3 fatty acids 1000 MG capsule Take 1 capsule by mouth 2 (two) times daily.        Marland Kitchen glimepiride (AMARYL) 1 MG tablet Take 1 mg by mouth daily before breakfast.      . metoprolol (LOPRESSOR) 50 MG tablet TAKE ONE TABLET BY MOUTH TWICE DAILY  60 tablet  6  . pravastatin (PRAVACHOL) 40 MG tablet Take 2 tablets (80 mg total) by mouth daily.  180 tablet  3   No current facility-administered medications for this visit.    Discontinued Meds:   There are no discontinued medications.  Patient Active Problem List  Diagnosis  . HYPERLIPIDEMIA  . ANEMIA  . Hypertension  . SINUS BRADYCARDIA  . Arteriosclerotic cardiovascular disease (ASCVD)  . RHEUMATOID ARTHRITIS  . Chronic kidney disease, stage 3, mod decreased GFR  . Diabetes mellitus    LABS    Component Value Date/Time   NA 140 02/15/2012 0945   NA 140 11/28/2009   NA 139 09/08/2009  0730   K 4.7 02/15/2012 0945   K 4.9 11/28/2009   K 5.4* 09/08/2009 0730   CL 104 02/15/2012 0945   CL 100 11/28/2009   CL 109 09/06/2009 0912   CO2 26 02/15/2012 0945   CO2 26 11/28/2009   CO2 24 09/06/2009 0912   GLUCOSE 97 02/15/2012 0945   GLUCOSE 123 11/28/2009   GLUCOSE 99 09/08/2009 0730   BUN 21 02/15/2012 0945   BUN 18 11/28/2009   BUN 28* 09/06/2009 0912   CREATININE 1.57* 02/15/2012 0945   CREATININE 1.64 12/14/2010   CREATININE 1.61 11/28/2009   CREATININE 2.12* 09/06/2009 0912   CALCIUM 9.4 02/15/2012 0945   CALCIUM 8.8 11/28/2009   CALCIUM 9.0 09/06/2009 0912   GFRNONAA 32* 09/06/2009 0912   GFRAA  Value: 38        The eGFR has been calculated using the MDRD equation. This calculation has not been validated in all clinical situations. eGFR's persistently <60 mL/min signify possible Chronic Kidney Disease.* 09/06/2009 0912   CMP     Component Value Date/Time   NA 140 02/15/2012 0945   K  4.7 02/15/2012 0945   CL 104 02/15/2012 0945   CO2 26 02/15/2012 0945   GLUCOSE 97 02/15/2012 0945   BUN 21 02/15/2012 0945   CREATININE 1.57* 02/15/2012 0945   CREATININE 1.64 12/14/2010   CALCIUM 9.4 02/15/2012 0945   PROT 7.3 01/05/2011 2022   ALBUMIN 3.8 01/05/2011 2022   AST 22 01/05/2011 2022   ALT 26 01/05/2011 2022   ALKPHOS 80 01/05/2011 2022   BILITOT 0.6 01/05/2011 2022   GFRNONAA 32* 09/06/2009 0912   GFRAA  Value: 38        The eGFR has been calculated using the MDRD equation. This calculation has not been validated in all clinical situations. eGFR's persistently <60 mL/min signify possible Chronic Kidney Disease.* 09/06/2009 0912       Component Value Date/Time   WBC 7.2 12/14/2010   WBC 9.7 09/06/2009 0912   HGB 14.0 12/14/2010   HGB 12.9* 09/08/2009 0730   HGB 11.7* 09/06/2009 0912   HCT 41.3 12/14/2010   HCT 38.0* 09/08/2009 0730   HCT 34.2* 09/06/2009 0912   MCV 93.0 12/14/2010   MCV 91.4 09/06/2009 0912    Lipid Panel     Component Value Date/Time   CHOL 161 01/05/2011 2022   TRIG 219*  01/05/2011 2022   HDL 36* 01/05/2011 2022   CHOLHDL 4.5 Ratio 01/05/2011 2022   VLDL 44* 01/05/2011 2022   LDLCALC 81 01/05/2011 2022    ABG No results found for this basename: phart, pco2, pco2art, po2, po2art, hco3, tco2, acidbasedef, o2sat     No results found for this basename: TSH   BNP (last 3 results) No results found for this basename: PROBNP,  in the last 8760 hours Cardiac Panel (last 3 results) No results found for this basename: CKTOTAL, CKMB, TROPONINI, RELINDX,  in the last 72 hours  Iron/TIBC/Ferritin No results found for this basename: iron, tibc, ferritin     EKG Orders placed in visit on 12/27/11  . EKG 12-LEAD     Prior Assessment and Plan Problem List as of 04/02/2013     ICD-9-CM   HYPERLIPIDEMIA   Last Assessment & Plan   12/27/2011 Office Visit Written 12/27/2011 11:44 AM by Yehuda Savannah, MD     Control of lipids is excellent; current therapy will be continued.    ANEMIA   Last Assessment & Plan   12/27/2011 Office Visit Written 12/27/2011 11:20 AM by Yehuda Savannah, MD     Anemia appears to have resolved spontaneously.  Most recent CBC was normal in 11/2010.    Hypertension   Last Assessment & Plan   12/27/2011 Office Visit Edited 01/01/2012 10:50 PM by Yehuda Savannah, MD     Control of hypertension is slightly suboptimal.  Lisinopril/HCT 20/12.5 mg will be added to his medical regime with monitoring of renal function and electrolytes.    SINUS BRADYCARDIA   Arteriosclerotic cardiovascular disease (ASCVD)   Last Assessment & Plan   12/27/2011 Office Visit Edited 01/01/2012 10:50 PM by Yehuda Savannah, MD     The patient has been asymptomatic with respect to coronary disease for at least the past 10 years.  We will continue to encourage him to be as active as possible and to control cardiovascular risk factors optimally.    RHEUMATOID ARTHRITIS   Chronic kidney disease, stage 3, mod decreased GFR   Last Assessment & Plan   12/27/2011 Office  Visit Written 12/27/2011 11:43 AM by Yehuda Savannah, MD  With diabetes and renal insufficiency, treatment with ACE inhibitor or ARB may be useful.  Lisinopril will be added for better control blood pressure and preservation of renal function.    Diabetes mellitus       Imaging: No results found.

## 2013-04-02 NOTE — Assessment & Plan Note (Signed)
Patient has done extremely well with coronary disease, suffering no acute exacerbations since presenting with myocardial infarction 15 years ago. We will continue to optimally manage cardiovascular risk factors.

## 2013-04-02 NOTE — Assessment & Plan Note (Signed)
Patient reports generally good control of diabetes with hemoglobin A1c less than 7.

## 2013-04-02 NOTE — Patient Instructions (Addendum)
Your physician recommends that you return for lab work in: 3 weeks for a CBC, & CMP                                                                                                 2 months for a BMP  Your physician has recommended that you wear a 24 hour blood pressure monitor.  You will also need a urine specimen. The container for this can be picked up at [facility] lab across the street from our office.  Your physician has recommended you make the following change in your medication:    Increase your Amlodipine to 5mg  daily                                                                                                                                             Start: Lisinopril 5 mg daily  Your physician recommends that you schedule a follow-up appointment in:  1 year.

## 2013-04-02 NOTE — Progress Notes (Signed)
Patient ID: Jacob Farmer, male   DOB: Jan 26, 1946, 67 y.o.   MRN: [id number]  HPI: Schedule return visit for this nice gentleman with a remote history of coronary artery disease, rheumatoid arthritis and multiple additional cardiovascular risk factors.  Since his last visit he has worked full time without any significant difficulties. He has not been hospitalized or required any urgent medical care. Treatment with adalimumab has been very effective for management of arthritis.  Current Outpatient Prescriptions  Medication Sig Dispense Refill  . adalimumab (HUMIRA) 40 MG/0.8ML injection Inject 40 mg into the skin every 14 (fourteen) days.        Marland Kitchen amLODipine (NORVASC) 5 MG tablet Take 1 tablet (5 mg total) by mouth daily.  90 tablet  3  . aspirin 81 MG tablet Take 81 mg by mouth every other day.       . fish oil-omega-3 fatty acids 1000 MG capsule Take 1 capsule by mouth 2 (two) times daily.        Marland Kitchen glimepiride (AMARYL) 1 MG tablet Take 1 mg by mouth daily before breakfast.      . metoprolol (LOPRESSOR) 50 MG tablet TAKE ONE TABLET BY MOUTH TWICE DAILY  60 tablet  6  . pravastatin (PRAVACHOL) 40 MG tablet Take 2 tablets (80 mg total) by mouth daily.  180 tablet  3  . lisinopril (PRINIVIL,ZESTRIL) 5 MG tablet Take 1 tablet (5 mg total) by mouth daily.  90 tablet  3   No current facility-administered medications for this visit.   No Known Allergies   Past medical history, social history, and family history reviewed and updated.  ROS: Denies chest pain, dyspnea, orthopnea, PND, lightheadedness or syncope. All other systems reviewed and are negative.  PHYSICAL EXAM: BP 150/76  Pulse 52  Ht 5\' 10"  (1.778 m)  Wt 96.163 kg (212 lb)  BMI 30.42 kg/m2  SpO2 99%;  Body mass index is 30.42 kg/(m^2). General-Well developed; no acute distress Body habitus-mildly overweight Neck-No JVD; no carotid bruits Lungs-clear lung fields; resonant to percussion Cardiovascular-normal PMI; normal S1 and  S2 Abdomen-normal bowel sounds; soft and non-tender without masses or organomegaly Musculoskeletal-No deformities, no cyanosis or clubbing Neurologic-Normal cranial nerves; symmetric strength and tone Skin-Warm, no significant lesions Extremities-distal pulses intact; no edema  Jacqulyn Ducking, MD 04/02/2013  1:04 PM  ASSESSMENT AND PLAN

## 2013-04-02 NOTE — Assessment & Plan Note (Signed)
No recent lipid profile available and none reflecting current lipid-lowering therapy. Repeat serum levels will be obtained.

## 2013-04-03 ENCOUNTER — Encounter: Payer: Self-pay | Admitting: Cardiology

## 2013-04-03 LAB — URINALYSIS
Bilirubin Urine: NEGATIVE
Glucose, UA: NEGATIVE mg/dL
Hgb urine dipstick: NEGATIVE
Ketones, ur: NEGATIVE mg/dL
Leukocytes, UA: NEGATIVE
Nitrite: NEGATIVE
Protein, ur: NEGATIVE mg/dL
Specific Gravity, Urine: 1.018 (ref 1.005–1.030)
Urobilinogen, UA: 0.2 mg/dL (ref 0.0–1.0)
pH: 5.5 (ref 5.0–8.0)

## 2013-04-03 LAB — MICROALBUMIN / CREATININE URINE RATIO
Creatinine, Urine: 167.2 mg/dL
Microalb Creat Ratio: 4.6 mg/g (ref 0.0–30.0)
Microalb, Ur: 0.77 mg/dL (ref 0.00–1.89)

## 2013-04-06 ENCOUNTER — Encounter: Payer: Self-pay | Admitting: *Deleted

## 2013-04-19 ENCOUNTER — Other Ambulatory Visit: Payer: Self-pay | Admitting: Cardiology

## 2013-04-19 ENCOUNTER — Encounter: Payer: Self-pay | Admitting: Cardiology

## 2013-04-19 LAB — COMPREHENSIVE METABOLIC PANEL
ALT: 16 U/L (ref 0–53)
AST: 12 U/L (ref 0–37)
Albumin: 4.2 g/dL (ref 3.5–5.2)
Alkaline Phosphatase: 79 U/L (ref 39–117)
BUN: 20 mg/dL (ref 6–23)
CO2: 25 mEq/L (ref 19–32)
Calcium: 9.7 mg/dL (ref 8.4–10.5)
Chloride: 101 mEq/L (ref 96–112)
Creat: 1.71 mg/dL — ABNORMAL HIGH (ref 0.50–1.35)
Glucose, Bld: 85 mg/dL (ref 70–99)
Potassium: 5.2 mEq/L (ref 3.5–5.3)
Sodium: 136 mEq/L (ref 135–145)
Total Bilirubin: 0.7 mg/dL (ref 0.3–1.2)
Total Protein: 7.6 g/dL (ref 6.0–8.3)

## 2013-04-19 LAB — CBC WITH DIFFERENTIAL/PLATELET
Basophils Absolute: 0 10*3/uL (ref 0.0–0.1)
Basophils Relative: 1 % (ref 0–1)
Eosinophils Absolute: 0.3 10*3/uL (ref 0.0–0.7)
Eosinophils Relative: 4 % (ref 0–5)
HCT: 44.8 % (ref 39.0–52.0)
Hemoglobin: 15.3 g/dL (ref 13.0–17.0)
Lymphocytes Relative: 23 % (ref 12–46)
Lymphs Abs: 2 10*3/uL (ref 0.7–4.0)
MCH: 29.7 pg (ref 26.0–34.0)
MCHC: 34.2 g/dL (ref 30.0–36.0)
MCV: 87 fL (ref 78.0–100.0)
Monocytes Absolute: 1.1 10*3/uL — ABNORMAL HIGH (ref 0.1–1.0)
Monocytes Relative: 13 % — ABNORMAL HIGH (ref 3–12)
Neutro Abs: 5.3 10*3/uL (ref 1.7–7.7)
Neutrophils Relative %: 59 % (ref 43–77)
Platelets: 215 10*3/uL (ref 150–400)
RBC: 5.15 MIL/uL (ref 4.22–5.81)
RDW: 13.8 % (ref 11.5–15.5)
WBC: 8.8 10*3/uL (ref 4.0–10.5)

## 2013-04-20 ENCOUNTER — Encounter: Payer: Self-pay | Admitting: *Deleted

## 2013-04-20 ENCOUNTER — Other Ambulatory Visit: Payer: Self-pay | Admitting: *Deleted

## 2013-04-20 DIAGNOSIS — I10 Essential (primary) hypertension: Secondary | ICD-10-CM

## 2013-04-29 ENCOUNTER — Inpatient Hospital Stay (HOSPITAL_COMMUNITY): Admission: RE | Admit: 2013-04-29 | Payer: BC Managed Care – PPO | Source: Ambulatory Visit

## 2013-05-03 ENCOUNTER — Ambulatory Visit (HOSPITAL_COMMUNITY)
Admission: RE | Admit: 2013-05-03 | Discharge: 2013-05-03 | Disposition: A | Discharge status: Home / Self Care | Payer: Medicare Other | Source: Ambulatory Visit | Attending: Cardiology | Admitting: Cardiology

## 2013-05-03 DIAGNOSIS — I2510 Atherosclerotic heart disease of native coronary artery without angina pectoris: Secondary | ICD-10-CM

## 2013-05-03 DIAGNOSIS — I10 Essential (primary) hypertension: Secondary | ICD-10-CM | POA: Insufficient documentation

## 2013-05-03 NOTE — Progress Notes (Signed)
Ambulatory Blood Pressure Monitor in progress 24 hrs.

## 2013-05-06 ENCOUNTER — Other Ambulatory Visit: Payer: Self-pay | Admitting: *Deleted

## 2013-05-06 DIAGNOSIS — I10 Essential (primary) hypertension: Secondary | ICD-10-CM

## 2013-06-23 LAB — HM DIABETES EYE EXAM

## 2013-08-23 ENCOUNTER — Encounter: Payer: Self-pay | Admitting: *Deleted

## 2013-08-23 ENCOUNTER — Other Ambulatory Visit: Payer: Self-pay | Admitting: *Deleted

## 2013-08-23 DIAGNOSIS — N183 Chronic kidney disease, stage 3 unspecified: Secondary | ICD-10-CM

## 2013-08-23 DIAGNOSIS — I10 Essential (primary) hypertension: Secondary | ICD-10-CM

## 2014-04-15 ENCOUNTER — Ambulatory Visit (INDEPENDENT_AMBULATORY_CARE_PROVIDER_SITE_OTHER): Payer: Commercial Managed Care - HMO | Admitting: Cardiology

## 2014-04-15 ENCOUNTER — Encounter: Payer: Self-pay | Admitting: Cardiology

## 2014-04-15 ENCOUNTER — Encounter (INDEPENDENT_AMBULATORY_CARE_PROVIDER_SITE_OTHER): Payer: Self-pay

## 2014-04-15 VITALS — BP 137/62 | HR 54 | Ht 70.0 in | Wt 214.0 lb

## 2014-04-15 DIAGNOSIS — I2510 Atherosclerotic heart disease of native coronary artery without angina pectoris: Secondary | ICD-10-CM

## 2014-04-15 DIAGNOSIS — I7090 Unspecified atherosclerosis: Secondary | ICD-10-CM

## 2014-04-15 DIAGNOSIS — E785 Hyperlipidemia, unspecified: Secondary | ICD-10-CM

## 2014-04-15 DIAGNOSIS — I10 Essential (primary) hypertension: Secondary | ICD-10-CM

## 2014-04-15 NOTE — Patient Instructions (Signed)
Your physician recommends that you schedule a follow-up appointment in: 1 year with Dr Harl Bowie

## 2014-04-15 NOTE — Progress Notes (Signed)
Clinical Summary [name] is a 68 y.o.male former patient of Dr Jacob Farmer, this is our first visit together. He is seen for the following medical problems.   1. CAD  - multiple interventions as described below - normal LV function per prior clinic notes, there is no documented study in our sytem - denies any recent chest pain. No SOB or DOE. Stays very active, walks on regular basis up to 1 hr 3 times a week. - no orthopnea, no LE edema - compliant with meds:   2. HTN - checks at home once a week. Typically 140s/70s, though he doesn't trust his monitor. Checks prior to taking meds.   3. Hyperlipidemia - compliant with pravastatin - followed by Dr Jacob Farmer, no recent panel in our system  4. CKD - followed by nephrology  5. Prediabetes - reports previously mildly increased HgbA1c that has gone back to normal, on glimepride.  Past Medical History  Diagnosis Date  . Arteriosclerotic cardiovascular disease (ASCVD)     Acute myocardial infarction treated with TPA in 1990; 1999-stents to circumflex and RCA; residual total occlusion of the left anterior descending; normal ejection fraction; stress nuclear in 2001-distal anteroseptal ischemia; normal LV function  . Hypertension   . Hyperlipidemia   . Sinus bradycardia   . Chronic kidney disease, stage 3, mod decreased GFR     Creatinine-2.18 and 12/2007, 1.76 and 12/2008  . Tobacco abuse, in remission     Discontinued in 1990     No Known Allergies   Current Outpatient Prescriptions  Medication Sig Dispense Refill  . adalimumab (HUMIRA) 40 MG/0.8ML injection Inject 40 mg into the skin every 14 (fourteen) days.        Marland Kitchen amLODipine (NORVASC) 5 MG tablet Take 1 tablet (5 mg total) by mouth daily.  90 tablet  3  . aspirin 81 MG tablet Take 81 mg by mouth every other day.       . fish oil-omega-3 fatty acids 1000 MG capsule Take 1 capsule by mouth 2 (two) times daily.        Marland Kitchen glimepiride (AMARYL) 1 MG tablet Take 1 mg by mouth  daily before breakfast.      . lisinopril (PRINIVIL,ZESTRIL) 5 MG tablet Take 1 tablet (5 mg total) by mouth daily.  90 tablet  3  . metoprolol (LOPRESSOR) 50 MG tablet TAKE ONE TABLET BY MOUTH TWICE DAILY  60 tablet  6  . pravastatin (PRAVACHOL) 40 MG tablet Take 2 tablets (80 mg total) by mouth daily.  180 tablet  3   No current facility-administered medications for this visit.     No past surgical history on file.   No Known Allergies    Family History  Problem Relation Age of Onset  . Heart attack Mother   . Coronary artery disease Father      Social History Jacob Farmer reports that he quit smoking about 24 years ago. He does not have any smokeless tobacco history on file. Jacob Farmer reports that he drinks about 10.5 ounces of alcohol per week.   Review of Systems CONSTITUTIONAL: No weight loss, fever, chills, weakness or fatigue.  HEENT: Eyes: No visual loss, blurred vision, double vision or yellow sclerae.No hearing loss, sneezing, congestion, runny nose or sore throat.  SKIN: No rash or itching.  CARDIOVASCULAR: per HPI RESPIRATORY: No shortness of breath, cough or sputum.  GASTROINTESTINAL: No anorexia, nausea, vomiting or diarrhea. No abdominal pain or blood.  GENITOURINARY: No burning on  urination, no polyuria NEUROLOGICAL: No headache, dizziness, syncope, paralysis, ataxia, numbness or tingling in the extremities. No change in bowel or bladder control.  MUSCULOSKELETAL: No muscle, back pain, joint pain or stiffness.  LYMPHATICS: No enlarged nodes. No history of splenectomy.  PSYCHIATRIC: No history of depression or anxiety.  ENDOCRINOLOGIC: No reports of sweating, cold or heat intolerance. No polyuria or polydipsia.  Marland Kitchen   Physical Examination p 54 bp 137/62 Wt 214 lbs BMI 31 Gen: resting comfortably, no acute distress HEENT: no scleral icterus, pupils equal round and reactive, no palptable cervical adenopathy,  CV: RRR, no m/r/g, no JVD Resp: Clear to  auscultation bilaterally GI: abdomen is soft, non-tender, non-distended, normal bowel sounds, no hepatosplenomegaly MSK: extremities are warm, no edema.  Skin: warm, no rash Neuro:  no focal deficits Psych: appropriate affect      Assessment and Plan   1. CAD - no current symptoms, continue risk facto modification and secondary prevention  2. HTN - at goal,continue current meds  3. Hyperlipidemia - request most recent panel from pcp, continue current statin       Jacob Farmer, M.D., F.A.C.C.

## 2014-10-24 LAB — HM DIABETES EYE EXAM

## 2015-03-01 DIAGNOSIS — D649 Anemia, unspecified: Secondary | ICD-10-CM | POA: Diagnosis not present

## 2015-03-01 DIAGNOSIS — E559 Vitamin D deficiency, unspecified: Secondary | ICD-10-CM | POA: Diagnosis not present

## 2015-03-01 DIAGNOSIS — Z79899 Other long term (current) drug therapy: Secondary | ICD-10-CM | POA: Diagnosis not present

## 2015-03-01 DIAGNOSIS — I10 Essential (primary) hypertension: Secondary | ICD-10-CM | POA: Diagnosis not present

## 2015-03-01 DIAGNOSIS — R809 Proteinuria, unspecified: Secondary | ICD-10-CM | POA: Diagnosis not present

## 2015-03-01 DIAGNOSIS — N183 Chronic kidney disease, stage 3 (moderate): Secondary | ICD-10-CM | POA: Diagnosis not present

## 2015-03-07 ENCOUNTER — Ambulatory Visit (INDEPENDENT_AMBULATORY_CARE_PROVIDER_SITE_OTHER): Payer: Commercial Managed Care - HMO | Admitting: Urology

## 2015-03-07 DIAGNOSIS — N529 Male erectile dysfunction, unspecified: Secondary | ICD-10-CM

## 2015-03-07 DIAGNOSIS — I10 Essential (primary) hypertension: Secondary | ICD-10-CM | POA: Diagnosis not present

## 2015-03-07 DIAGNOSIS — N2581 Secondary hyperparathyroidism of renal origin: Secondary | ICD-10-CM | POA: Diagnosis not present

## 2015-03-07 DIAGNOSIS — E559 Vitamin D deficiency, unspecified: Secondary | ICD-10-CM | POA: Diagnosis not present

## 2015-03-07 DIAGNOSIS — N183 Chronic kidney disease, stage 3 (moderate): Secondary | ICD-10-CM | POA: Diagnosis not present

## 2015-03-07 DIAGNOSIS — R972 Elevated prostate specific antigen [PSA]: Secondary | ICD-10-CM

## 2015-03-23 DIAGNOSIS — Z79899 Other long term (current) drug therapy: Secondary | ICD-10-CM | POA: Diagnosis not present

## 2015-03-23 DIAGNOSIS — Z111 Encounter for screening for respiratory tuberculosis: Secondary | ICD-10-CM | POA: Diagnosis not present

## 2015-04-17 DIAGNOSIS — E119 Type 2 diabetes mellitus without complications: Secondary | ICD-10-CM | POA: Diagnosis not present

## 2015-04-17 DIAGNOSIS — Z6829 Body mass index (BMI) 29.0-29.9, adult: Secondary | ICD-10-CM | POA: Diagnosis not present

## 2015-04-17 DIAGNOSIS — I10 Essential (primary) hypertension: Secondary | ICD-10-CM | POA: Diagnosis not present

## 2015-04-17 DIAGNOSIS — E782 Mixed hyperlipidemia: Secondary | ICD-10-CM | POA: Diagnosis not present

## 2015-04-20 ENCOUNTER — Ambulatory Visit (INDEPENDENT_AMBULATORY_CARE_PROVIDER_SITE_OTHER): Payer: Commercial Managed Care - HMO | Admitting: Cardiology

## 2015-04-20 ENCOUNTER — Encounter: Payer: Self-pay | Admitting: Cardiology

## 2015-04-20 VITALS — BP 142/76 | HR 67 | Ht 70.0 in | Wt 207.0 lb

## 2015-04-20 DIAGNOSIS — I2510 Atherosclerotic heart disease of native coronary artery without angina pectoris: Secondary | ICD-10-CM | POA: Diagnosis not present

## 2015-04-20 DIAGNOSIS — I10 Essential (primary) hypertension: Secondary | ICD-10-CM | POA: Diagnosis not present

## 2015-04-20 DIAGNOSIS — E785 Hyperlipidemia, unspecified: Secondary | ICD-10-CM

## 2015-04-20 MED ORDER — ATORVASTATIN CALCIUM 80 MG PO TABS: 80.0000 mg | ORAL_TABLET | Freq: Every day | ORAL | Status: DC

## 2015-04-20 NOTE — Progress Notes (Signed)
Clinical Summary Jacob Farmer is a 69 y.o.male seen today for follow up of the following medical problems.   1. CAD - multiple interventions as described below - normal LV function per prior clinic notes, there is no documented study in our sytem  - denies any chest pain. No SOB or DOE - compliant with meds  2. HTN - compliant with meds  3. Hyperlipidemia - compliant with pravastatin - followed by Dr Orson Ape, no recent panel in our system  4. CKD - followed by nephrology  5. Prediabetes - reports previously mildly increased HgbA1c that has gone back to normal, on glimepride.  Past Medical History  Diagnosis Date  . Arteriosclerotic cardiovascular disease (ASCVD)     Acute myocardial infarction treated with TPA in 1990; 1999-stents to circumflex and RCA; residual total occlusion of the left anterior descending; normal ejection fraction; stress nuclear in 2001-distal anteroseptal ischemia; normal LV function  . Hypertension   . Hyperlipidemia   . Sinus bradycardia   . Chronic kidney disease, stage 3, mod decreased GFR     Creatinine-2.18 and 12/2007, 1.76 and 12/2008  . Tobacco abuse, in remission     Discontinued in 1990     No Known Allergies   Current Outpatient Prescriptions  Medication Sig Dispense Refill  . adalimumab (HUMIRA) 40 MG/0.8ML injection Inject 40 mg into the skin every 14 (fourteen) days.      Marland Kitchen amLODipine (NORVASC) 5 MG tablet Take 1 tablet (5 mg total) by mouth daily. 90 tablet 3  . aspirin 81 MG tablet Take 81 mg by mouth every other day.     . fish oil-omega-3 fatty acids 1000 MG capsule Take 1 capsule by mouth 2 (two) times daily.      Marland Kitchen glimepiride (AMARYL) 1 MG tablet Take 1 mg by mouth daily before breakfast.    . metoprolol (LOPRESSOR) 50 MG tablet TAKE ONE TABLET BY MOUTH TWICE DAILY 60 tablet 6  . pravastatin (PRAVACHOL) 40 MG tablet Take 40 mg by mouth 2 (two) times daily.     No current facility-administered medications for this  visit.     No past surgical history on file.   No Known Allergies    Family History  Problem Relation Age of Onset  . Heart attack Mother   . Coronary artery disease Father      Social History Mr. Erlich reports that he quit smoking about 25 years ago. He does not have any smokeless tobacco history on file. Mr. Sensat reports that he drinks about 10.5 oz of alcohol per week.   Review of Systems CONSTITUTIONAL: No weight loss, fever, chills, weakness or fatigue.  HEENT: Eyes: No visual loss, blurred vision, double vision or yellow sclerae.No hearing loss, sneezing, congestion, runny nose or sore throat.  SKIN: No rash or itching.  CARDIOVASCULAR: per HPI RESPIRATORY: No shortness of breath, cough or sputum.  GASTROINTESTINAL: No anorexia, nausea, vomiting or diarrhea. No abdominal pain or blood.  GENITOURINARY: No burning on urination, no polyuria NEUROLOGICAL: No headache, dizziness, syncope, paralysis, ataxia, numbness or tingling in the extremities. No change in bowel or bladder control.  MUSCULOSKELETAL: No muscle, back pain, joint pain or stiffness.  LYMPHATICS: No enlarged nodes. No history of splenectomy.  PSYCHIATRIC: No history of depression or anxiety.  ENDOCRINOLOGIC: No reports of sweating, cold or heat intolerance. No polyuria or polydipsia.  Marland Kitchen   Physical Examination p 67 bp 140/80 Wt 207 lbs BMI 30 Gen: resting comfortably, no acute distress  HEENT: no scleral icterus, pupils equal round and reactive, no palptable cervical adenopathy,  CV: RRR, no m/r/g, no JVD, no carotid bruits Resp: Clear to auscultation bilaterally GI: abdomen is soft, non-tender, non-distended, normal bowel sounds, no hepatosplenomegaly MSK: extremities are warm, no edema.  Skin: warm, no rash Neuro:  no focal deficits Psych: appropriate affect   Diagnostic Studies     Assessment and Plan  1. CAD - no current symptoms, continue risk facto modification and secondary  prevention  2. HTN - elevated, goal bp given his CKD is <130/80 - he has home bp cuff but unsure if accurate. He will bring in for nursinga appt for bp check and compare his home cuff to our values.   3. Hyperlipidemia - request most recent panel from pcp - in setting of CAD change to high dose statin with 80mg  daily.    F/u 1 year   Arnoldo Lenis, M.D

## 2015-04-20 NOTE — Patient Instructions (Addendum)
Your physician wants you to follow-up in: 1 year with Dr Bryna Colander will receive a reminder letter in the mail two months in advance. If you don't receive a letter, please call our office to schedule the follow-up appointment.      STOP Pravastatin  START Lipitor 80 mg daily   Please bring your home bp monitor here to check with [place] at nurse apt,schedule at your convenience     Thank you for choosing [facility] !         Addendum : pt returned with home blood pressure machine, his machine BP 180/83, manual by [place] 152/78

## 2015-05-03 DIAGNOSIS — M069 Rheumatoid arthritis, unspecified: Secondary | ICD-10-CM | POA: Diagnosis not present

## 2015-05-03 DIAGNOSIS — M25571 Pain in right ankle and joints of right foot: Secondary | ICD-10-CM | POA: Diagnosis not present

## 2015-05-03 DIAGNOSIS — Z79899 Other long term (current) drug therapy: Secondary | ICD-10-CM | POA: Diagnosis not present

## 2015-05-03 DIAGNOSIS — M79641 Pain in right hand: Secondary | ICD-10-CM | POA: Diagnosis not present

## 2015-05-03 DIAGNOSIS — M79671 Pain in right foot: Secondary | ICD-10-CM | POA: Diagnosis not present

## 2015-05-03 DIAGNOSIS — M79672 Pain in left foot: Secondary | ICD-10-CM | POA: Diagnosis not present

## 2015-05-03 DIAGNOSIS — M79642 Pain in left hand: Secondary | ICD-10-CM | POA: Diagnosis not present

## 2015-06-08 DIAGNOSIS — E782 Mixed hyperlipidemia: Secondary | ICD-10-CM | POA: Diagnosis not present

## 2015-06-08 DIAGNOSIS — E1129 Type 2 diabetes mellitus with other diabetic kidney complication: Secondary | ICD-10-CM | POA: Diagnosis not present

## 2015-06-08 DIAGNOSIS — E663 Overweight: Secondary | ICD-10-CM | POA: Diagnosis not present

## 2015-06-08 DIAGNOSIS — Z1389 Encounter for screening for other disorder: Secondary | ICD-10-CM | POA: Diagnosis not present

## 2015-06-08 DIAGNOSIS — I10 Essential (primary) hypertension: Secondary | ICD-10-CM | POA: Diagnosis not present

## 2015-06-08 DIAGNOSIS — Z6829 Body mass index (BMI) 29.0-29.9, adult: Secondary | ICD-10-CM | POA: Diagnosis not present

## 2015-06-08 DIAGNOSIS — Z0000 Encounter for general adult medical examination without abnormal findings: Secondary | ICD-10-CM | POA: Diagnosis not present

## 2015-06-21 DIAGNOSIS — E785 Hyperlipidemia, unspecified: Secondary | ICD-10-CM | POA: Diagnosis not present

## 2015-06-21 DIAGNOSIS — Z79899 Other long term (current) drug therapy: Secondary | ICD-10-CM | POA: Diagnosis not present

## 2015-08-22 DIAGNOSIS — I10 Essential (primary) hypertension: Secondary | ICD-10-CM | POA: Diagnosis not present

## 2015-08-22 DIAGNOSIS — R809 Proteinuria, unspecified: Secondary | ICD-10-CM | POA: Diagnosis not present

## 2015-08-22 DIAGNOSIS — N183 Chronic kidney disease, stage 3 (moderate): Secondary | ICD-10-CM | POA: Diagnosis not present

## 2015-08-22 DIAGNOSIS — E559 Vitamin D deficiency, unspecified: Secondary | ICD-10-CM | POA: Diagnosis not present

## 2015-08-22 DIAGNOSIS — Z79899 Other long term (current) drug therapy: Secondary | ICD-10-CM | POA: Diagnosis not present

## 2015-08-22 DIAGNOSIS — R972 Elevated prostate specific antigen [PSA]: Secondary | ICD-10-CM | POA: Diagnosis not present

## 2015-08-22 DIAGNOSIS — D649 Anemia, unspecified: Secondary | ICD-10-CM | POA: Diagnosis not present

## 2015-08-29 ENCOUNTER — Other Ambulatory Visit: Payer: Self-pay | Admitting: Urology

## 2015-08-29 ENCOUNTER — Ambulatory Visit (INDEPENDENT_AMBULATORY_CARE_PROVIDER_SITE_OTHER): Payer: Commercial Managed Care - HMO | Admitting: Urology

## 2015-08-29 DIAGNOSIS — R972 Elevated prostate specific antigen [PSA]: Secondary | ICD-10-CM | POA: Diagnosis not present

## 2015-09-06 DIAGNOSIS — Z841 Family history of disorders of kidney and ureter: Secondary | ICD-10-CM | POA: Diagnosis not present

## 2015-09-06 DIAGNOSIS — M06041 Rheumatoid arthritis without rheumatoid factor, right hand: Secondary | ICD-10-CM | POA: Diagnosis not present

## 2015-09-06 DIAGNOSIS — M19041 Primary osteoarthritis, right hand: Secondary | ICD-10-CM | POA: Diagnosis not present

## 2015-09-06 DIAGNOSIS — Z79899 Other long term (current) drug therapy: Secondary | ICD-10-CM | POA: Diagnosis not present

## 2015-09-08 ENCOUNTER — Other Ambulatory Visit: Payer: Self-pay | Admitting: Urology

## 2015-09-11 ENCOUNTER — Other Ambulatory Visit: Payer: Self-pay | Admitting: Urology

## 2015-09-11 DIAGNOSIS — R972 Elevated prostate specific antigen [PSA]: Secondary | ICD-10-CM

## 2015-09-12 ENCOUNTER — Ambulatory Visit (HOSPITAL_COMMUNITY)
Admission: RE | Admit: 2015-09-12 | Discharge: 2015-09-12 | Disposition: A | Discharge status: Home / Self Care | Payer: Commercial Managed Care - HMO | Source: Ambulatory Visit | Attending: Urology | Admitting: Urology

## 2015-09-12 DIAGNOSIS — R972 Elevated prostate specific antigen [PSA]: Secondary | ICD-10-CM

## 2015-09-12 DIAGNOSIS — C61 Malignant neoplasm of prostate: Secondary | ICD-10-CM | POA: Diagnosis not present

## 2015-09-12 MED ORDER — LIDOCAINE HCL (PF) 2 % IJ SOLN
INTRAMUSCULAR | Status: AC
  Filled 2015-09-12: qty 10

## 2015-09-12 MED ORDER — GENTAMICIN SULFATE 40 MG/ML IJ SOLN: 160.0000 mg | Freq: Once | INTRAMUSCULAR | Status: DC

## 2015-09-12 MED ORDER — LIDOCAINE HCL (PF) 2 % IJ SOLN: 10.0000 mL | Freq: Once | INTRAMUSCULAR | Status: DC

## 2015-09-12 MED ORDER — GENTAMICIN SULFATE 40 MG/ML IJ SOLN
INTRAMUSCULAR | Status: AC
  Filled 2015-09-12: qty 2

## 2015-09-12 NOTE — Sedation Documentation (Signed)
Gentamicin 160mg  given IM right upper outer gluteus

## 2015-09-12 NOTE — Discharge Instructions (Signed)
Transrectal Ultrasound-Guided Biopsy °A transrectal ultrasound-guided biopsy is a procedure to remove samples of tissue from your prostate using ultrasound images to guide the procedure. The procedure is usually done to evaluate the prostate gland of men who have an elevated prostate-specific antigen (PSA). PSA is a blood test to screen for prostate cancer. The biopsy samples are taken to check for prostate cancer.  °LET YOUR HEALTH CARE PROVIDER KNOW ABOUT: °· Any allergies you have. °· All medicines you are taking, including vitamins, herbs, eye drops, creams, and over-the-counter medicines. °· Previous problems you or members of your family have had with the use of anesthetics. °· Any blood disorders you have. °· Previous surgeries you have had. °· Medical conditions you have. °RISKS AND COMPLICATIONS °Generally, this is a safe procedure. However, as with any procedure, problems can occur. Possible problems include: °· Infection of your prostate. °· Bleeding from your rectum or blood in your urine. °· Difficulty urinating. °· Nerve damage (this is usually temporary). °· Damage to surrounding structures such as blood vessels, organs, and muscles, which would require other procedures. °BEFORE THE PROCEDURE °· Do not eat or drink anything after midnight on the night before the procedure or as directed by your health care provider. °· Take medicines only as directed by your health care provider. °· Your health care provider may have you stop taking certain medicines 5-7 days before the procedure. °· You will be given an enema before the procedure. During an enema, a liquid is injected into your rectum to clear out waste. °· You may have lab tests the day of your procedure.   °· Plan to have someone take you home after the procedure. °PROCEDURE  °· You will be given medicine to help you relax (sedative) before the procedure. An IV tube will be inserted into one of your veins and used to give fluids and  medicine. °· You will be given antibiotic medicine to reduce the risk of an infection. °· You will be placed on your side for the procedure. °· A probe with lubricated gel will be placed into your rectum, and images will be taken of your prostate and surrounding structures. °· Numbing medicine will be injected into the prostate before the biopsy samples are taken. °· A biopsy needle will then be inserted and guided to your prostate with the use of the ultrasound images. °· Samples of prostate tissue will be taken, and the needle will then be removed. °· The biopsy samples will be sent to a lab to be analyzed. Results are usually back in 2-3 days. °AFTER THE PROCEDURE °· You will be taken to a recovery area where you will be monitored. °· You may have some discomfort in the rectal area. You will be given pain medicines to control this. °· You may be allowed to go home the same day, or you may need to stay in the hospital overnight. °Document Released: 04/18/2014 Document Reviewed: 07/21/2013 °[employer]® Patient Information ©2015 [employer]. This information is not intended to replace advice given to you by your health care provider. Make sure you discuss any questions you have with your health care provider. ° °

## 2015-09-12 NOTE — Sedation Documentation (Signed)
Lidocaine 2% 76ml given by MD

## 2015-09-13 DIAGNOSIS — I10 Essential (primary) hypertension: Secondary | ICD-10-CM | POA: Diagnosis not present

## 2015-09-13 DIAGNOSIS — R809 Proteinuria, unspecified: Secondary | ICD-10-CM | POA: Diagnosis not present

## 2015-09-13 DIAGNOSIS — N2581 Secondary hyperparathyroidism of renal origin: Secondary | ICD-10-CM | POA: Diagnosis not present

## 2015-09-13 DIAGNOSIS — E559 Vitamin D deficiency, unspecified: Secondary | ICD-10-CM | POA: Diagnosis not present

## 2015-09-13 DIAGNOSIS — N183 Chronic kidney disease, stage 3 (moderate): Secondary | ICD-10-CM | POA: Diagnosis not present

## 2015-09-13 DIAGNOSIS — E875 Hyperkalemia: Secondary | ICD-10-CM | POA: Diagnosis not present

## 2015-09-22 DIAGNOSIS — Z79899 Other long term (current) drug therapy: Secondary | ICD-10-CM | POA: Diagnosis not present

## 2015-10-02 DIAGNOSIS — C61 Malignant neoplasm of prostate: Secondary | ICD-10-CM | POA: Diagnosis not present

## 2015-10-09 DIAGNOSIS — Z6829 Body mass index (BMI) 29.0-29.9, adult: Secondary | ICD-10-CM | POA: Diagnosis not present

## 2015-10-09 DIAGNOSIS — N183 Chronic kidney disease, stage 3 (moderate): Secondary | ICD-10-CM | POA: Diagnosis not present

## 2015-10-09 DIAGNOSIS — E663 Overweight: Secondary | ICD-10-CM | POA: Diagnosis not present

## 2015-10-09 DIAGNOSIS — I10 Essential (primary) hypertension: Secondary | ICD-10-CM | POA: Diagnosis not present

## 2015-10-09 DIAGNOSIS — Z1389 Encounter for screening for other disorder: Secondary | ICD-10-CM | POA: Diagnosis not present

## 2015-10-09 DIAGNOSIS — C61 Malignant neoplasm of prostate: Secondary | ICD-10-CM | POA: Diagnosis not present

## 2015-10-09 DIAGNOSIS — B351 Tinea unguium: Secondary | ICD-10-CM | POA: Diagnosis not present

## 2015-10-09 DIAGNOSIS — E782 Mixed hyperlipidemia: Secondary | ICD-10-CM | POA: Diagnosis not present

## 2015-10-09 DIAGNOSIS — E1129 Type 2 diabetes mellitus with other diabetic kidney complication: Secondary | ICD-10-CM | POA: Diagnosis not present

## 2015-10-17 DIAGNOSIS — E119 Type 2 diabetes mellitus without complications: Secondary | ICD-10-CM | POA: Diagnosis not present

## 2015-10-17 LAB — HM DIABETES EYE EXAM

## 2015-10-20 DIAGNOSIS — E875 Hyperkalemia: Secondary | ICD-10-CM | POA: Diagnosis not present

## 2015-10-25 ENCOUNTER — Other Ambulatory Visit: Payer: Self-pay | Admitting: Urology

## 2015-10-25 ENCOUNTER — Ambulatory Visit: Payer: Commercial Managed Care - HMO | Admitting: Radiation Oncology

## 2015-10-25 ENCOUNTER — Telehealth: Payer: Self-pay | Admitting: *Deleted

## 2015-10-25 ENCOUNTER — Other Ambulatory Visit: Payer: Self-pay | Admitting: Radiation Oncology

## 2015-10-25 DIAGNOSIS — M1990 Unspecified osteoarthritis, unspecified site: Secondary | ICD-10-CM | POA: Diagnosis not present

## 2015-10-25 DIAGNOSIS — Z79899 Other long term (current) drug therapy: Secondary | ICD-10-CM | POA: Diagnosis not present

## 2015-10-25 DIAGNOSIS — I10 Essential (primary) hypertension: Secondary | ICD-10-CM | POA: Diagnosis not present

## 2015-10-25 DIAGNOSIS — I252 Old myocardial infarction: Secondary | ICD-10-CM | POA: Diagnosis not present

## 2015-10-25 DIAGNOSIS — Z87891 Personal history of nicotine dependence: Secondary | ICD-10-CM | POA: Diagnosis not present

## 2015-10-25 DIAGNOSIS — E119 Type 2 diabetes mellitus without complications: Secondary | ICD-10-CM | POA: Diagnosis not present

## 2015-10-25 DIAGNOSIS — C61 Malignant neoplasm of prostate: Secondary | ICD-10-CM | POA: Diagnosis not present

## 2015-10-25 DIAGNOSIS — E7800 Pure hypercholesterolemia, unspecified: Secondary | ICD-10-CM | POA: Diagnosis not present

## 2015-10-25 DIAGNOSIS — Z8042 Family history of malignant neoplasm of prostate: Secondary | ICD-10-CM | POA: Diagnosis not present

## 2015-10-25 NOTE — Telephone Encounter (Signed)
CALLED PATIENT TO INFORM OF PRE-SEED APPT. AND IMPLANT, NO ANSWER WILL CALL LATER.

## 2015-10-25 NOTE — Telephone Encounter (Signed)
Called patient to inform of pre-seed planning CT - for 11-17-15 and his implant on 01-11-16, spoke with patient's wife and she is aware of these appts.

## 2015-11-16 ENCOUNTER — Telehealth: Payer: Self-pay | Admitting: *Deleted

## 2015-11-16 NOTE — Telephone Encounter (Signed)
CALLED PATIENT TO REMIND OF PRE-SEED APPT. FOR 11-17-15, NO ANSWER WILL CALL LATER

## 2015-11-16 NOTE — Telephone Encounter (Signed)
Called patient to remind of appt. For 11-17-15, spoke with patient and he is aware of this appt.

## 2015-11-17 ENCOUNTER — Ambulatory Visit
Admission: RE | Admit: 2015-11-17 | Discharge: 2015-11-17 | Disposition: A | Discharge status: Home / Self Care | Payer: Commercial Managed Care - HMO | Source: Ambulatory Visit | Attending: Radiation Oncology | Admitting: Radiation Oncology

## 2015-11-17 ENCOUNTER — Ambulatory Visit (HOSPITAL_COMMUNITY)
Admission: RE | Admit: 2015-11-17 | Discharge: 2015-11-17 | Disposition: A | Discharge status: Home / Self Care | Payer: Commercial Managed Care - HMO | Source: Ambulatory Visit | Attending: Radiation Oncology | Admitting: Radiation Oncology

## 2015-11-17 ENCOUNTER — Ambulatory Visit (HOSPITAL_COMMUNITY)
Admission: RE | Admit: 2015-11-17 | Discharge: 2015-11-17 | Disposition: A | Discharge status: Home / Self Care | Payer: Commercial Managed Care - HMO | Source: Ambulatory Visit | Attending: Urology | Admitting: Urology

## 2015-11-17 DIAGNOSIS — C61 Malignant neoplasm of prostate: Secondary | ICD-10-CM | POA: Insufficient documentation

## 2015-11-17 DIAGNOSIS — Z510 Encounter for antineoplastic radiation therapy: Secondary | ICD-10-CM | POA: Insufficient documentation

## 2015-11-17 NOTE — Progress Notes (Signed)
  Radiation Oncology         [phone] ________________________________  Name: TAHEIM FITT MRN: [id number]  Date: 11/17/2015  DOB: 12-28-1945  SIMULATION AND TREATMENT PLANNING NOTE PUBIC ARCH STUDY  [id number] Jerilynn Mages, MD  Elsie Lincoln, MD  DIAGNOSIS: Prostate Cancer     ICD-9-CM ICD-10-CM   1. Malignant neoplasm of prostate ([place]) [place]:  The patient presented today for evaluation for possible prostate seed implant. He was brought to the radiation planning suite and placed supine on the CT couch. A 3-dimensional image study set was obtained in upload to the planning computer. There, on each axial slice, I contoured the prostate gland. Then, using three-dimensional radiation planning tools I reconstructed the prostate in view of the structures from the transperineal needle pathway to assess for possible pubic arch interference. In doing so, I did not appreciate any pubic arch interference. Also, the patient's prostate volume was estimated based on the drawn structure. The volume was 24 cc.  Given the pubic arch appearance and prostate volume, patient remains a good candidate to proceed with prostate seed implant. Today, he freely provided informed written consent to proceed.    PLAN: The patient will undergo prostate seed implant.   ________________________________  Sheral Apley. Tammi Klippel, M.D.    This document serves as a record of services personally performed by Tyler Pita, MD. It was created on his behalf by Arlyce Harman, a trained medical scribe. The creation of this record is based on the scribe's personal observations and the provider's statements to them. This document has been checked and approved by the attending provider.

## 2015-12-26 DIAGNOSIS — Z79899 Other long term (current) drug therapy: Secondary | ICD-10-CM | POA: Diagnosis not present

## 2016-01-03 ENCOUNTER — Telehealth: Payer: Self-pay | Admitting: *Deleted

## 2016-01-03 ENCOUNTER — Encounter (HOSPITAL_BASED_OUTPATIENT_CLINIC_OR_DEPARTMENT_OTHER): Payer: Self-pay | Admitting: *Deleted

## 2016-01-03 NOTE — Telephone Encounter (Signed)
CALLED PATIENT TO REMIND OF LABS FOR IMPLANT ON 01-04-16, SPOKE WITH PATIENT AND HE IS AWARE OF THIS LAB.

## 2016-01-04 ENCOUNTER — Encounter (HOSPITAL_BASED_OUTPATIENT_CLINIC_OR_DEPARTMENT_OTHER): Payer: Self-pay | Admitting: *Deleted

## 2016-01-04 DIAGNOSIS — N183 Chronic kidney disease, stage 3 (moderate): Secondary | ICD-10-CM | POA: Diagnosis not present

## 2016-01-04 DIAGNOSIS — E1122 Type 2 diabetes mellitus with diabetic chronic kidney disease: Secondary | ICD-10-CM | POA: Diagnosis not present

## 2016-01-04 DIAGNOSIS — I2510 Atherosclerotic heart disease of native coronary artery without angina pectoris: Secondary | ICD-10-CM | POA: Diagnosis not present

## 2016-01-04 DIAGNOSIS — Z955 Presence of coronary angioplasty implant and graft: Secondary | ICD-10-CM | POA: Diagnosis not present

## 2016-01-04 DIAGNOSIS — Z87891 Personal history of nicotine dependence: Secondary | ICD-10-CM | POA: Diagnosis not present

## 2016-01-04 DIAGNOSIS — C61 Malignant neoplasm of prostate: Secondary | ICD-10-CM | POA: Diagnosis not present

## 2016-01-04 DIAGNOSIS — M1990 Unspecified osteoarthritis, unspecified site: Secondary | ICD-10-CM | POA: Diagnosis not present

## 2016-01-04 DIAGNOSIS — E785 Hyperlipidemia, unspecified: Secondary | ICD-10-CM | POA: Diagnosis not present

## 2016-01-04 DIAGNOSIS — M069 Rheumatoid arthritis, unspecified: Secondary | ICD-10-CM | POA: Diagnosis not present

## 2016-01-04 DIAGNOSIS — I252 Old myocardial infarction: Secondary | ICD-10-CM | POA: Diagnosis not present

## 2016-01-04 DIAGNOSIS — I129 Hypertensive chronic kidney disease with stage 1 through stage 4 chronic kidney disease, or unspecified chronic kidney disease: Secondary | ICD-10-CM | POA: Diagnosis not present

## 2016-01-04 LAB — COMPREHENSIVE METABOLIC PANEL
ALT: 25 U/L (ref 17–63)
AST: 20 U/L (ref 15–41)
Albumin: 3.5 g/dL (ref 3.5–5.0)
Alkaline Phosphatase: 100 U/L (ref 38–126)
Anion gap: 10 (ref 5–15)
BUN: 16 mg/dL (ref 6–20)
CO2: 26 mmol/L (ref 22–32)
Calcium: 9 mg/dL (ref 8.9–10.3)
Chloride: 102 mmol/L (ref 101–111)
Creatinine, Ser: 1.55 mg/dL — ABNORMAL HIGH (ref 0.61–1.24)
GFR calc Af Amer: 50 mL/min — ABNORMAL LOW (ref >60)
GFR calc non Af Amer: 43 mL/min — ABNORMAL LOW (ref >60)
Glucose, Bld: 261 mg/dL — ABNORMAL HIGH (ref 65–99)
Potassium: 4.6 mmol/L (ref 3.5–5.1)
Sodium: 138 mmol/L (ref 135–145)
Total Bilirubin: 1.1 mg/dL (ref 0.3–1.2)
Total Protein: 7.4 g/dL (ref 6.5–8.1)

## 2016-01-04 LAB — CBC
HCT: 41.8 % (ref 39.0–52.0)
Hemoglobin: 14.1 g/dL (ref 13.0–17.0)
MCH: 30.5 pg (ref 26.0–34.0)
MCHC: 33.7 g/dL (ref 30.0–36.0)
MCV: 90.5 fL (ref 78.0–100.0)
Platelets: 180 10*3/uL (ref 150–400)
RBC: 4.62 MIL/uL (ref 4.22–5.81)
RDW: 13 % (ref 11.5–15.5)
WBC: 7.3 10*3/uL (ref 4.0–10.5)

## 2016-01-04 LAB — PROTIME-INR
INR: 1.04 (ref 0.00–1.49)
Prothrombin Time: 13.8 seconds (ref 11.6–15.2)

## 2016-01-04 LAB — APTT: aPTT: 27 seconds (ref 24–37)

## 2016-01-04 NOTE — Progress Notes (Signed)
NPO AFTER MN.  ARRIVE AT 0600.  CURRENT LAB RESULTS, EKG, AND CXR IN CHART AND EPIC.  WILL TAKE METAPROLOL AND NORVASC AM DOS W/ SIPS OF WATER.  WILL DO FLEET ENEMA AM DOS.

## 2016-01-08 DIAGNOSIS — C61 Malignant neoplasm of prostate: Secondary | ICD-10-CM | POA: Diagnosis not present

## 2016-01-10 ENCOUNTER — Telehealth: Payer: Self-pay | Admitting: *Deleted

## 2016-01-10 NOTE — H&P (Signed)
Urology History and Physical Exam  CC: Prostate  HPI: 70 year old male presents for brachytherapy for prostate cancer.  PMH: Past Medical History  Diagnosis Date  . Arteriosclerotic cardiovascular disease (ASCVD) cardiologist-  dr Roderic [facility]    Acute myocardial infarction treated with TPA in 1990; 1999-stents to circumflex and RCA; residual total occlusion of the left anterior descending; normal ejection fraction; stress nuclear in 2001-distal anteroseptal ischemia; normal LV function  . Hypertension   . Hyperlipidemia   . Sinus bradycardia   . First degree heart block   . History of MI (myocardial infarction)     1990-  treated w/ TPA  . S/P drug eluting coronary stent placement     1999--  DES x2 to CFX and RCA  . History of gastric ulcer   . Prostate cancer [facility]) urologist-- dr Ethen Bannan/  oncologist- dr Tammi Klippel    Stage T1c , Gleason 3+4,  PSA 4.7,  vol 24cc--  scheduled for radiative seed implants  . Prediabetes   . Psoriatic arthritis ([place])   . RA (rheumatoid arthritis) ([place])     rheumologist-  dr Toni Amend  . OA (osteoarthritis)   . Chronic kidney disease, stage 3, mod decreased GFR nephrologist-  at [facility] in [place]    Creatinine-2.18 and 12/2007, 1.76 and 12/2008  . Wears dentures     upper and lower partial    PSH: Past Surgical History  Procedure Laterality Date  . Cardiovascular stress test  2001  per Dr Lattie Haw clinic note    distal anteroseptal ischemia,  normal LVF  . Coronary angioplasty with stent placement  1999    DES x2  to CFX and RCA/  residual total occlusion LAD,  normal LVEF  . Colonoscopy  last one 2006 (approx)    Allergies: No Known Allergies  Medications: No prescriptions prior to admission     Social History: Social History   Social History  . Marital Status: Married    Spouse Name: N/A  . Number of Children: 2  . Years of Education: N/A   Occupational History  . Retired Clinical biochemist    Social History Main  Topics  . Smoking status: Former Smoker -- 1.00 packs/day for 30 years    Types: Cigarettes    Quit date: 05/21/1989  . Smokeless tobacco: Never Used  . Alcohol Use: 21.0 oz/week    14 Shots of liquor, 21 Standard drinks or equivalent per week     Comment: 2-3 oz per day  . Drug Use: No  . Sexual Activity: Not on file   Other Topics Concern  . Not on file   Social History Narrative   No regular exercise    Family History: Family History  Problem Relation Age of Onset  . Heart attack Mother   . Coronary artery disease Father     Review of Systems: Genitourinary, constitutional, skin, eye, otolaryngeal, hematologic/lymphatic, cardiovascular, pulmonary, endocrine, musculoskeletal, gastrointestinal, neurological and psychiatric system(s) were reviewed and pertinent findings if present are noted and are otherwise negative.  Genitourinary: erectile dysfunction.                Physical Exam: @VITALS2 @ General: No acute distress.  Awake. Head:  Normocephalic.  Atraumatic. ENT:  EOMI.  Mucous membranes moist Neck:  Supple.  No lymphadenopathy. CV:  S1 present. S2 present. Regular rate. Pulmonary: Equal effort bilaterally.  Clear to auscultation bilaterally. Abdomen: Soft.  Non tender to palpation. Skin:  Normal turgor.  No visible rash. Extremity: No  gross deformity of bilateral upper extremities.  No gross deformity of                             lower extremities. Neurologic: Alert. Appropriate mood.   Studies:  No results for input(s): HGB, WBC, PLT in the last 72 hours.  No results for input(s): NA, K, CL, CO2, BUN, CREATININE, CALCIUM, GFRNONAA, GFRAA in the last 72 hours.  Invalid input(s): MAGNESIUM   No results for input(s): INR, APTT in the last 72 hours.  Invalid input(s): PT   Invalid input(s): ABG    Assessment:  Pca  Plan: I-125 brachytherapy

## 2016-01-10 NOTE — Telephone Encounter (Signed)
CALLED PATIENT TO REMIND OF IMPLANT FOR 01-11-16, SPOKE WITH PATIENT'S WIFE, [name] AND THEY AWARE OF THIS IMPLANT

## 2016-01-11 ENCOUNTER — Encounter (HOSPITAL_BASED_OUTPATIENT_CLINIC_OR_DEPARTMENT_OTHER): Payer: Self-pay | Admitting: *Deleted

## 2016-01-11 ENCOUNTER — Encounter (HOSPITAL_BASED_OUTPATIENT_CLINIC_OR_DEPARTMENT_OTHER)
Admission: RE | Disposition: A | Discharge status: Home / Self Care | Payer: Self-pay | Source: Ambulatory Visit | Attending: Urology

## 2016-01-11 ENCOUNTER — Ambulatory Visit (HOSPITAL_BASED_OUTPATIENT_CLINIC_OR_DEPARTMENT_OTHER): Payer: Commercial Managed Care - HMO | Admitting: Anesthesiology

## 2016-01-11 ENCOUNTER — Ambulatory Visit (HOSPITAL_COMMUNITY): Payer: Commercial Managed Care - HMO

## 2016-01-11 ENCOUNTER — Ambulatory Visit (HOSPITAL_BASED_OUTPATIENT_CLINIC_OR_DEPARTMENT_OTHER)
Admission: RE | Admit: 2016-01-11 | Discharge: 2016-01-11 | Disposition: A | Discharge status: Home / Self Care | Payer: Commercial Managed Care - HMO | Source: Ambulatory Visit | Attending: Urology | Admitting: Urology

## 2016-01-11 DIAGNOSIS — I129 Hypertensive chronic kidney disease with stage 1 through stage 4 chronic kidney disease, or unspecified chronic kidney disease: Secondary | ICD-10-CM | POA: Diagnosis not present

## 2016-01-11 DIAGNOSIS — M069 Rheumatoid arthritis, unspecified: Secondary | ICD-10-CM | POA: Insufficient documentation

## 2016-01-11 DIAGNOSIS — N189 Chronic kidney disease, unspecified: Secondary | ICD-10-CM | POA: Diagnosis not present

## 2016-01-11 DIAGNOSIS — C61 Malignant neoplasm of prostate: Secondary | ICD-10-CM | POA: Diagnosis not present

## 2016-01-11 DIAGNOSIS — I252 Old myocardial infarction: Secondary | ICD-10-CM | POA: Diagnosis not present

## 2016-01-11 DIAGNOSIS — N183 Chronic kidney disease, stage 3 (moderate): Secondary | ICD-10-CM | POA: Diagnosis not present

## 2016-01-11 DIAGNOSIS — Z955 Presence of coronary angioplasty implant and graft: Secondary | ICD-10-CM | POA: Insufficient documentation

## 2016-01-11 DIAGNOSIS — I2510 Atherosclerotic heart disease of native coronary artery without angina pectoris: Secondary | ICD-10-CM | POA: Insufficient documentation

## 2016-01-11 DIAGNOSIS — M1990 Unspecified osteoarthritis, unspecified site: Secondary | ICD-10-CM | POA: Insufficient documentation

## 2016-01-11 DIAGNOSIS — E785 Hyperlipidemia, unspecified: Secondary | ICD-10-CM | POA: Diagnosis not present

## 2016-01-11 DIAGNOSIS — E1122 Type 2 diabetes mellitus with diabetic chronic kidney disease: Secondary | ICD-10-CM | POA: Insufficient documentation

## 2016-01-11 DIAGNOSIS — E119 Type 2 diabetes mellitus without complications: Secondary | ICD-10-CM | POA: Diagnosis not present

## 2016-01-11 DIAGNOSIS — Z87891 Personal history of nicotine dependence: Secondary | ICD-10-CM | POA: Insufficient documentation

## 2016-01-11 HISTORY — DX: Atrioventricular block, first degree: I44.0

## 2016-01-11 HISTORY — DX: Prediabetes: R73.03

## 2016-01-11 HISTORY — DX: Presence of dental prosthetic device (complete) (partial): Z97.2

## 2016-01-11 HISTORY — DX: Old myocardial infarction: I25.2

## 2016-01-11 HISTORY — DX: Personal history of peptic ulcer disease: Z87.11

## 2016-01-11 HISTORY — PX: RADIOACTIVE SEED IMPLANT: SHX5150

## 2016-01-11 HISTORY — DX: Arthropathic psoriasis, unspecified: L40.50

## 2016-01-11 HISTORY — DX: Personal history of other diseases of the digestive system: Z87.19

## 2016-01-11 HISTORY — DX: Presence of coronary angioplasty implant and graft: Z95.5

## 2016-01-11 HISTORY — DX: Rheumatoid arthritis, unspecified: M06.9

## 2016-01-11 HISTORY — DX: Unspecified osteoarthritis, unspecified site: M19.90

## 2016-01-11 HISTORY — DX: Malignant neoplasm of prostate: C61

## 2016-01-11 LAB — GLUCOSE, CAPILLARY: Glucose-Capillary: 150 mg/dL — ABNORMAL HIGH (ref 65–99)

## 2016-01-11 SURGERY — INSERTION, RADIATION SOURCE, PROSTATE
Anesthesia: General

## 2016-01-11 MED ORDER — HYDROMORPHONE HCL 1 MG/ML IJ SOLN
0.2500 mg | INTRAMUSCULAR | Status: DC | PRN
  Filled 2016-01-11: qty 1

## 2016-01-11 MED ORDER — MIDAZOLAM HCL 5 MG/5ML IJ SOLN
INTRAMUSCULAR | Status: DC | PRN
  Administered 2016-01-11: 2 mg via INTRAVENOUS

## 2016-01-11 MED ORDER — CEPHALEXIN 500 MG PO CAPS: 500.0000 mg | ORAL_CAPSULE | Freq: Two times a day (BID) | ORAL | Status: DC

## 2016-01-11 MED ORDER — DEXAMETHASONE SODIUM PHOSPHATE 10 MG/ML IJ SOLN
INTRAMUSCULAR | Status: AC
  Filled 2016-01-11: qty 1

## 2016-01-11 MED ORDER — LIDOCAINE HCL (CARDIAC) 20 MG/ML IV SOLN
INTRAVENOUS | Status: AC
  Filled 2016-01-11: qty 15

## 2016-01-11 MED ORDER — FENTANYL CITRATE (PF) 100 MCG/2ML IJ SOLN
INTRAMUSCULAR | Status: DC | PRN
  Administered 2016-01-11 (×3): 50 ug via INTRAVENOUS

## 2016-01-11 MED ORDER — CIPROFLOXACIN IN D5W 400 MG/200ML IV SOLN
400.0000 mg | INTRAVENOUS | Status: AC
  Administered 2016-01-11: 400 mg via INTRAVENOUS
  Filled 2016-01-11: qty 200

## 2016-01-11 MED ORDER — DEXAMETHASONE SODIUM PHOSPHATE 4 MG/ML IJ SOLN
INTRAMUSCULAR | Status: DC | PRN
  Administered 2016-01-11: 10 mg via INTRAVENOUS

## 2016-01-11 MED ORDER — CIPROFLOXACIN IN D5W 400 MG/200ML IV SOLN
INTRAVENOUS | Status: AC
  Filled 2016-01-11: qty 200

## 2016-01-11 MED ORDER — ONDANSETRON HCL 4 MG/2ML IJ SOLN
INTRAMUSCULAR | Status: AC
  Filled 2016-01-11: qty 2

## 2016-01-11 MED ORDER — SUCCINYLCHOLINE CHLORIDE 20 MG/ML IJ SOLN
INTRAMUSCULAR | Status: DC | PRN
  Administered 2016-01-11: 100 mg via INTRAVENOUS

## 2016-01-11 MED ORDER — ONDANSETRON HCL 4 MG/2ML IJ SOLN
INTRAMUSCULAR | Status: DC | PRN
  Administered 2016-01-11: 4 mg via INTRAVENOUS

## 2016-01-11 MED ORDER — MIDAZOLAM HCL 2 MG/2ML IJ SOLN
INTRAMUSCULAR | Status: AC
  Filled 2016-01-11: qty 2

## 2016-01-11 MED ORDER — PROMETHAZINE HCL 25 MG/ML IJ SOLN
6.2500 mg | INTRAMUSCULAR | Status: DC | PRN
  Filled 2016-01-11: qty 1

## 2016-01-11 MED ORDER — LIDOCAINE HCL (CARDIAC) 20 MG/ML IV SOLN
INTRAVENOUS | Status: DC | PRN
  Administered 2016-01-11: 80 mg via INTRAVENOUS

## 2016-01-11 MED ORDER — FENTANYL CITRATE (PF) 250 MCG/5ML IJ SOLN
INTRAMUSCULAR | Status: AC
  Filled 2016-01-11: qty 5

## 2016-01-11 MED ORDER — LACTATED RINGERS IV SOLN
INTRAVENOUS | Status: DC
  Administered 2016-01-11 (×3): via INTRAVENOUS
  Filled 2016-01-11: qty 1000

## 2016-01-11 MED ORDER — ARTIFICIAL TEARS OP OINT
TOPICAL_OINTMENT | OPHTHALMIC | Status: AC
  Filled 2016-01-11: qty 3.5

## 2016-01-11 MED ORDER — HYDROCODONE-ACETAMINOPHEN 5-325 MG PO TABS: 1.0000 | ORAL_TABLET | ORAL | Status: DC | PRN

## 2016-01-11 MED ORDER — IOHEXOL 350 MG/ML SOLN
INTRAVENOUS | Status: DC | PRN
  Administered 2016-01-11: 3 mL

## 2016-01-11 MED ORDER — FLEET ENEMA 7-19 GM/118ML RE ENEM
1.0000 | ENEMA | Freq: Once | RECTAL | Status: DC
  Filled 2016-01-11: qty 1

## 2016-01-11 MED ORDER — PROPOFOL 10 MG/ML IV BOLUS
INTRAVENOUS | Status: DC | PRN
  Administered 2016-01-11: 50 mg via INTRAVENOUS
  Administered 2016-01-11: 120 mg via INTRAVENOUS
  Administered 2016-01-11: 50 mg via INTRAVENOUS

## 2016-01-11 MED ORDER — HYDROCODONE-ACETAMINOPHEN 5-325 MG PO TABS
1.0000 | ORAL_TABLET | ORAL | Status: DC | PRN
Start: 2016-01-11 — End: 2016-02-01

## 2016-01-11 SURGICAL SUPPLY — 28 items
BAG URINE DRAINAGE (UROLOGICAL SUPPLIES) ×2 IMPLANT
BLADE CLIPPER SURG (BLADE) ×2 IMPLANT
CATH FOLEY 2WAY SLVR  5CC 16FR (CATHETERS) ×1
CATH FOLEY 2WAY SLVR 5CC 16FR (CATHETERS) ×1 IMPLANT
CATH ROBINSON RED A/P 20FR (CATHETERS) ×2 IMPLANT
CLOTH BEACON ORANGE TIMEOUT ST (SAFETY) ×2 IMPLANT
COVER BACK TABLE 60X90IN (DRAPES) ×2 IMPLANT
COVER MAYO STAND STRL (DRAPES) ×2 IMPLANT
DRSG TEGADERM 4X4.75 (GAUZE/BANDAGES/DRESSINGS) ×2 IMPLANT
DRSG TEGADERM 8X12 (GAUZE/BANDAGES/DRESSINGS) ×2 IMPLANT
GLOVE BIO SURGEON STRL SZ 6.5 (GLOVE) ×2 IMPLANT
GLOVE BIO SURGEON STRL SZ8 (GLOVE) ×4 IMPLANT
GLOVE BIOGEL PI IND STRL 6.5 (GLOVE) ×1 IMPLANT
GLOVE BIOGEL PI INDICATOR 6.5 (GLOVE) ×1
GLOVE ECLIPSE 8.0 STRL XLNG CF (GLOVE) ×6 IMPLANT
GOWN STRL REUS W/ TWL LRG LVL3 (GOWN DISPOSABLE) ×1 IMPLANT
GOWN STRL REUS W/ TWL XL LVL3 (GOWN DISPOSABLE) ×1 IMPLANT
GOWN STRL REUS W/TWL LRG LVL3 (GOWN DISPOSABLE) ×2
GOWN STRL REUS W/TWL XL LVL3 (GOWN DISPOSABLE) ×2
HOLDER FOLEY CATH W/STRAP (MISCELLANEOUS) IMPLANT
KIT ROOM TURNOVER WOR (KITS) ×2 IMPLANT
PACK CYSTO (CUSTOM PROCEDURE TRAY) ×2 IMPLANT
SELECTSEED ×2 IMPLANT
SPONGE GAUZE 4X4 12PLY STER LF (GAUZE/BANDAGES/DRESSINGS) ×2 IMPLANT
SYRINGE 10CC LL (SYRINGE) ×4 IMPLANT
UNDERPAD 30X30 INCONTINENT (UNDERPADS AND DIAPERS) ×4 IMPLANT
WATER STERILE IRR 3000ML UROMA (IV SOLUTION) ×2 IMPLANT
WATER STERILE IRR 500ML POUR (IV SOLUTION) ×2 IMPLANT

## 2016-01-11 NOTE — Op Note (Signed)
Preoperative diagnosis: Clinical stage TI C adenocarcinoma the prostate   Postoperative diagnosis: Same   Procedure: I-125 prostate seed implantation with Nucletron robotic implanter   Surgeon: Lillette Boxer. Kinzley Savell M.D.  Radiation Oncologist:[place]  Anesthesia: Gen.   Indications: Patient  was diagnosed with clinical stage TI C prostate cancer. We had extensive discussion with him about treatment options versus. He elected to proceed with seed implantation. He underwent consultation my office as well as with Dr. Tammi Klippel. He appeared to understand the advantages disadvantages potential risks of this treatment option. Full informed consent has been obtained.   Technique and findings: Patient was brought the operating room where he had successful induction of general anesthesia. He was placed in dorso-lithotomy position and prepped and draped in usual manner. Appropriate surgical timeout was performed. Radiation oncology department placed a transrectal ultrasound probe anchoring stand. Foley catheter with contrast in the balloon was inserted without difficulty. Anchoring needles were placed within the prostate. Rectal tube was placed. Real-time contouring of the urethra prostate and rectum were performed and the dosing parameters were established. Targeted dose was 145 gray.  I was then called  to the operating suite suite for placement of the needles. A second timeout was performed. All needle passage was done with real-time transrectal ultrasound guidance with the sagittal plane. A total of 24 needles were placed. The implantation itself was done with the robotic implanter. 73active seeds were implanted. The Foley catheter was removed and flexible cystoscopy failed to show any seeds outside the prostate.  The patient was brought to recovery room in stable condition, having tolerated the procedure well.Marland Kitchen

## 2016-01-11 NOTE — Transfer of Care (Signed)
Immediate Anesthesia Transfer of Care Note  Patient: Jacob Farmer  Procedure(s) Performed: Procedure(s) with comments: RADIOACTIVE SEED IMPLANT/BRACHYTHERAPY IMPLANT (N/A) -  71  seeds implanted no seeds founds in bladder  Patient Location: PACU  Anesthesia Type:General  Level of Consciousness: awake, alert , oriented and patient cooperative  Airway & Oxygen Therapy: Patient Spontanous Breathing and Patient connected to nasal cannula oxygen  Post-op Assessment: Report given to RN and Post -op Vital signs reviewed and stable  Post vital signs: Reviewed and stable  Last Vitals:  Filed Vitals:   01/11/16 0605  BP: 159/66  Pulse: 51  Temp: 37.2 C  Resp: 16    Complications: No apparent anesthesia complications

## 2016-01-11 NOTE — Discharge Instructions (Signed)
Radioactive Seed Implant Home Care Instructions   Activity:    Rest for the remainder of the day.  Do not drive or operate equipment today.  You may resume normal  activities in a few days as instructed by your physician, without risk of harmful radiation exposure to those around you, provided you follow the time and distance precautions on the Radiation Oncology Instruction Sheet.   Meals: Drink plenty of lipuids and eat light foods, such as gelatin or soup this evening .  You may return to normal meal plan tomorrow.  Return To Work: You may return to work as instructed by Naval architect.  Special Instruction:   If any seeds are found, use tweezers to pick up seeds and place in a glass container of any kind and bring to your physician's office.  Call your physician if any of these symptoms occur:   Persistent or heavy bleeding  Urine stream diminishes or stops completely after catheter is removed  Fever equal to or greater than 101 degrees F  Cloudy urine with a strong foul odor  Severe pain  You may feel some burning pain and/or hesitancy when you urinate after the catheter is removed.  These symptoms may increase over the next few weeks, but should diminish within forur to six weeks.  Applying moist heat to the lower abdomen or a hot tub bath may help relieve the pain.  If the discomfort becomes severe, please call your physician for additional medications.  Follow-up (Date of Return Visit to Physician): as scheduled      Post Anesthesia Home Care Instructions  Activity: Get plenty of rest for the remainder of the day. A responsible adult should stay with you for 24 hours following the procedure.  For the next 24 hours, DO NOT: -Drive a car -Paediatric nurse -Drink alcoholic beverages -Take any medication unless instructed by your physician -Make any legal decisions or sign important papers.  Meals: Start with liquid foods such as gelatin or soup. Progress to  regular foods as tolerated. Avoid greasy, spicy, heavy foods. If nausea and/or vomiting occur, drink only clear liquids until the nausea and/or vomiting subsides. Call your physician if vomiting continues.  Special Instructions/Symptoms: Your throat may feel dry or sore from the anesthesia or the breathing tube placed in your throat during surgery. If this causes discomfort, gargle with warm salt water. The discomfort should disappear within 24 hours.  If you had a scopolamine patch placed behind your ear for the management of post- operative nausea and/or vomiting:  1. The medication in the patch is effective for 72 hours, after which it should be removed.  Wrap patch in a tissue and discard in the trash. Wash hands thoroughly with soap and water. 2. You may remove the patch earlier than 72 hours if you experience unpleasant side effects which may include dry mouth, dizziness or visual disturbances. 3. Avoid touching the patch. Wash your hands with soap and water after contact with the patch.

## 2016-01-11 NOTE — Anesthesia Procedure Notes (Signed)
Procedure Name: Intubation Date/Time: 01/11/2016 7:45 AM Performed by: Wanita Chamberlain Pre-anesthesia Checklist: Patient being monitored, Suction available, Emergency Drugs available, Patient identified and Timeout performed Patient Re-evaluated:Patient Re-evaluated prior to inductionOxygen Delivery Method: Circle system utilized Preoxygenation: Pre-oxygenation with 100% oxygen Intubation Type: IV induction Ventilation: Mask ventilation without difficulty Laryngoscope Size: Mac and 3 Grade View: Grade II Tube type: Oral Tube size: 8.0 mm Number of attempts: 1 Airway Equipment and Method: Stylet Placement Confirmation: ETT inserted through vocal cords under direct vision and positive ETCO2 Secured at: 23 cm Tube secured with: Tape Dental Injury: Teeth and Oropharynx as per pre-operative assessment

## 2016-01-11 NOTE — Progress Notes (Signed)
  Radiation Oncology         [phone] ________________________________  Name: Jacob Farmer MRN: [id number]  Date: 01/11/2016  DOB: [birth year]       Prostate Seed Implant  [id number] J., MD  No ref. provider found  DIAGNOSIS:  70 yo man with stage T1c adenocarcinoma of the prostate with a Gleason's score of 3+4 and a PSA of 4.3     ICD-9-CM ICD-10-CM   1. Prostate cancer ([facility]) 185 C61 DG Chest 2 View     DG Chest 2 View     Discharge patient    PROCEDURE: Insertion of radioactive I-125 seeds into the prostate gland.  RADIATION DOSE: 145 Gy, definitive therapy.  TECHNIQUE: DONTREL SPRAKER was brought to the operating room with the urologist. He was placed in the dorsolithotomy position. He was catheterized and a rectal tube was inserted. The perineum was shaved, prepped and draped. The ultrasound probe was then introduced into the rectum to see the prostate gland.  TREATMENT DEVICE: A needle grid was attached to the ultrasound probe stand and anchor needles were placed.  3D PLANNING: The prostate was imaged in 3D using a sagittal sweep of the prostate probe. These images were transferred to the planning computer. There, the prostate, urethra and rectum were defined on each axial reconstructed image. Then, the software created an optimized 3D plan and a few seed positions were adjusted. The quality of the plan was reviewed using [facility] information for the target and the following two organs at risk:  Urethra and Rectum.  Then the accepted plan was uploaded to the seed Selectron afterloading unit.  PROSTATE VOLUME STUDY:  Using transrectal ultrasound the volume of the prostate was verified to be 29.6 cc.  SPECIAL TREATMENT PROCEDURE/SUPERVISION AND HANDLING: The Nucletron FIRST system was used to place the needles under sagittal guidance. A total of 24 needles were used to deposit 73 seeds in the prostate gland. The individual seed activity was 0.335 mCi.  COMPLEX SIMULATION: At  the end of the procedure, an anterior radiograph of the pelvis was obtained to document seed positioning and count. Cystoscopy was performed to check the urethra and bladder.  MICRODOSIMETRY: At the end of the procedure, the patient was emitting 0.1 mR/hr at 1 meter. Accordingly, he was considered safe for hospital discharge.  PLAN: The patient will return to the radiation oncology clinic for post implant CT dosimetry in three weeks.   ________________________________  Sheral Apley Tammi Klippel, M.D.

## 2016-01-11 NOTE — Anesthesia Preprocedure Evaluation (Addendum)
Anesthesia Evaluation  Patient identified by MRN, date of birth, ID band Patient awake    Reviewed: Allergy & Precautions, NPO status , Patient's Chart, lab work & pertinent test results  Airway Mallampati: II  TM Distance: >3 FB Neck ROM: Full    Dental no notable dental hx. (+) Upper Dentures, Partial Lower, Dental Advisory Given   Pulmonary neg pulmonary ROS, former smoker,    Pulmonary exam normal breath sounds clear to auscultation       Cardiovascular hypertension, Pt. on medications + CAD and + Cardiac Stents  Normal cardiovascular exam Rhythm:Regular Rate:Normal     Neuro/Psych negative neurological ROS  negative psych ROS   GI/Hepatic negative GI ROS, (+)     substance abuse  alcohol use,   Endo/Other  diabetes  Renal/GU Renal InsufficiencyRenal disease  negative genitourinary   Musculoskeletal  (+) Arthritis , Rheumatoid disorders,    Abdominal   Peds negative pediatric ROS (+)  Hematology negative hematology ROS (+)   Anesthesia Other Findings   Reproductive/Obstetrics negative OB ROS                            Anesthesia Physical Anesthesia Plan  ASA: III  Anesthesia Plan: General   Post-op Pain Management:    Induction: Intravenous  Airway Management Planned: LMA and Oral ETT  Additional Equipment:   Intra-op Plan:   Post-operative Plan: Extubation in OR  Informed Consent: I have reviewed the patients History and Physical, chart, labs and discussed the procedure including the risks, benefits and alternatives for the proposed anesthesia with the patient or authorized representative who has indicated his/her understanding and acceptance.   Dental advisory given  Plan Discussed with: CRNA and Surgeon  Anesthesia Plan Comments:         Anesthesia Quick Evaluation

## 2016-01-11 NOTE — Anesthesia Postprocedure Evaluation (Signed)
Anesthesia Post Note  Patient: Jacob Farmer  Procedure(s) Performed: Procedure(s) (LRB): RADIOACTIVE SEED IMPLANT/BRACHYTHERAPY IMPLANT (N/A)  Patient location during evaluation: PACU Anesthesia Type: General Level of consciousness: awake and alert Pain management: pain level controlled Vital Signs Assessment: post-procedure vital signs reviewed and stable Respiratory status: spontaneous breathing, nonlabored ventilation, respiratory function stable and patient connected to nasal cannula oxygen Cardiovascular status: blood pressure returned to baseline and stable Postop Assessment: no signs of nausea or vomiting Anesthetic complications: no    Last Vitals:  Filed Vitals:   01/11/16 1010 01/11/16 1015  BP:  133/66  Pulse: 53 55  Temp:    Resp: 13 15    Last Pain: There were no vitals filed for this visit.               Sherilyn Windhorst S

## 2016-01-12 ENCOUNTER — Encounter (HOSPITAL_BASED_OUTPATIENT_CLINIC_OR_DEPARTMENT_OTHER): Payer: Self-pay | Admitting: Urology

## 2016-01-17 DIAGNOSIS — E119 Type 2 diabetes mellitus without complications: Secondary | ICD-10-CM | POA: Diagnosis not present

## 2016-01-17 DIAGNOSIS — Z6830 Body mass index (BMI) 30.0-30.9, adult: Secondary | ICD-10-CM | POA: Diagnosis not present

## 2016-01-17 DIAGNOSIS — I10 Essential (primary) hypertension: Secondary | ICD-10-CM | POA: Diagnosis not present

## 2016-01-17 DIAGNOSIS — Z1389 Encounter for screening for other disorder: Secondary | ICD-10-CM | POA: Diagnosis not present

## 2016-01-17 DIAGNOSIS — C61 Malignant neoplasm of prostate: Secondary | ICD-10-CM | POA: Diagnosis not present

## 2016-01-31 ENCOUNTER — Telehealth: Payer: Self-pay | Admitting: *Deleted

## 2016-01-31 NOTE — Telephone Encounter (Signed)
CALLED PATIENT TO REMIND OF POST OP APPTS. FOR 02-01-16, SPOKE WITH PATIENT AND HE IS AWARE OF THESE APPTS.

## 2016-02-01 ENCOUNTER — Ambulatory Visit
Admit: 2016-02-01 | Discharge: 2016-02-01 | Disposition: A | Discharge status: Home / Self Care | Payer: Commercial Managed Care - HMO | Attending: Radiation Oncology | Admitting: Radiation Oncology

## 2016-02-01 ENCOUNTER — Encounter: Payer: Self-pay | Admitting: Radiation Oncology

## 2016-02-01 VITALS — BP 141/65 | HR 50 | Temp 97.8°F | Ht 70.0 in | Wt 216.7 lb

## 2016-02-01 DIAGNOSIS — Z510 Encounter for antineoplastic radiation therapy: Secondary | ICD-10-CM | POA: Diagnosis not present

## 2016-02-01 DIAGNOSIS — Z7982 Long term (current) use of aspirin: Secondary | ICD-10-CM | POA: Diagnosis not present

## 2016-02-01 DIAGNOSIS — C61 Malignant neoplasm of prostate: Secondary | ICD-10-CM | POA: Diagnosis not present

## 2016-02-01 NOTE — Progress Notes (Signed)
  Radiation Oncology         [phone] ________________________________  Name: Jacob Farmer MRN: [id number]  Date: 02/01/2016  DOB: 12-01-1946  COMPLEX SIMULATION NOTE  NARRATIVE:  The patient was brought to the [place] suite today following prostate seed implantation approximately one month ago.  Identity was confirmed.  All relevant records and images related to the planned course of therapy were reviewed.  Then, the patient was set-up supine.  CT images were obtained.  The CT images were loaded into the planning software.  Then the prostate and rectum were contoured.  Treatment planning then occurred.  The implanted iodine 125 seeds were identified by the physics staff for projection of radiation distribution  I have requested : 3D Simulation  I have requested a DVH of the following structures: Prostate and rectum.    ________________________________  Sheral Apley Tammi Klippel, M.D.  This document serves as a record of services personally performed by Tyler Pita, MD. It was created on his behalf by Darcus Austin, a trained medical scribe. The creation of this record is based on the scribe's personal observations and the provider's statements to them. This document has been checked and approved by the attending provider.

## 2016-02-01 NOTE — Progress Notes (Signed)
Radiation Oncology         [phone] ________________________________  Name: Jacob Farmer MRN: [id number]  Date: 02/01/2016  DOB: 1946-02-17  Follow-Up Visit Note  CC: Glo Herring., MD  Franchot Gallo, MD  Diagnosis: 70 y.o. man with stage T1c adenocarcinoma of the prostate with a Gleason's score of 3+4 and a PSA of 4.3    ICD-9-CM ICD-10-CM   1. Malignant neoplasm of prostate ([facility]) 185 C61     Interval Since Last Radiation:  3  weeks   01/11/16: Insertion of radioactive I-125 seeds into the prostate gland. 145 Gy, definitive therapy  Narrative:  The patient returns today for routine follow-up.  He is complaining of increased urinary frequency and urinary hesitation symptoms. He filled out a questionnaire regarding urinary function today providing and overall IPSS score of 9 characterizing his symptoms as moderate.  His pre-implant score was 2. He denies any bowel symptoms.  The patient presents with his wife.  ALLERGIES:  has No Known Allergies.  Meds: Current Outpatient Prescriptions  Medication Sig Dispense Refill  . adalimumab (HUMIRA) 40 MG/0.8ML injection Inject 40 mg into the skin every 14 (fourteen) days.      Marland Kitchen amLODipine (NORVASC) 5 MG tablet Take 1 tablet (5 mg total) by mouth daily. (Patient taking differently: Take 5 mg by mouth every morning. ) 90 tablet 3  . aspirin 81 MG tablet Take 81 mg by mouth every other day.     Marland Kitchen atorvastatin (LIPITOR) 80 MG tablet Take 1 tablet (80 mg total) by mouth daily. (Patient taking differently: Take 80 mg by mouth every evening. ) 90 tablet 3  . fish oil-omega-3 fatty acids 1000 MG capsule Take 1 capsule by mouth 2 (two) times daily.      Marland Kitchen glimepiride (AMARYL) 1 MG tablet Take 1 mg by mouth daily before breakfast.    . metoprolol (LOPRESSOR) 50 MG tablet TAKE ONE TABLET BY MOUTH TWICE DAILY 60 tablet 6   No current facility-administered medications for this encounter.    Physical Findings: The patient is in no  acute distress. Patient is alert and oriented.  height is 5\' 10"  (1.778 m) and weight is 216 lb 11.2 oz (98.294 kg). His temperature is 97.8 F (36.6 C). His blood pressure is 141/65 and his pulse is 50.   No significant changes.  Lab Findings: Lab Results  Component Value Date   WBC 7.3 01/04/2016   HGB 14.1 01/04/2016   HCT 41.8 01/04/2016   MCV 90.5 01/04/2016   PLT 180 01/04/2016    Radiographic Findings:  Patient underwent CT imaging in our clinic for post implant dosimetry. The CT appears to demonstrate an adequate distribution of radioactive seeds throughout the prostate gland. There no seeds in her near the rectum. I suspect the final radiation plan and dosimetry will show appropriate coverage of the prostate gland.   Impression: The patient is recovering from the effects of radiation. His urinary symptoms should gradually improve over the next 4-6 months. We talked about this today. He is encouraged by his improvement already and is otherwise please with his outcome.   Plan: Today, I spent time talking to the patient about his prostate seed implant and resolving urinary symptoms. We also talked about long-term follow-up for prostate cancer following seed implant. He understands that ongoing PSA determinations and digital rectal exams will help perform surveillance to rule out disease recurrence. He understands what to expect with his PSA measures. Patient was also educated today about  some of the long-term effects from radiation including a small risk for rectal bleeding and possibly erectile dysfunction. We talked about some of the general management approaches to these potential complications. However, I did encourage the patient to contact our office or return at any point if he has questions or concerns related to his previous radiation and prostate cancer.  _____________________________________  Sheral Apley. Tammi Klippel, M.D.  This document serves as a record of services personally  performed by Tyler Pita, MD. It was created on his behalf by Darcus Austin, a trained medical scribe. The creation of this record is based on the scribe's personal observations and the provider's statements to them. This document has been checked and approved by the attending provider.

## 2016-02-01 NOTE — Progress Notes (Signed)
[name] here fore reassessment S/P XRT to the pelvis for prostate cancer.  Strains to void in AM with weak stream, Nocturia x 2. Uncomfortable when voiding. Less fatigue

## 2016-02-02 ENCOUNTER — Encounter: Payer: Self-pay | Admitting: Radiation Oncology

## 2016-02-02 DIAGNOSIS — Z510 Encounter for antineoplastic radiation therapy: Secondary | ICD-10-CM | POA: Diagnosis not present

## 2016-02-02 DIAGNOSIS — C61 Malignant neoplasm of prostate: Secondary | ICD-10-CM | POA: Diagnosis not present

## 2016-02-02 DIAGNOSIS — Z0000 Encounter for general adult medical examination without abnormal findings: Secondary | ICD-10-CM | POA: Diagnosis not present

## 2016-02-04 DIAGNOSIS — C61 Malignant neoplasm of prostate: Secondary | ICD-10-CM | POA: Diagnosis not present

## 2016-02-04 NOTE — Progress Notes (Signed)
  Radiation Oncology         [phone] ________________________________  Name: Jacob Farmer MRN: [id number]  Date: 02/02/2016  DOB: [birth year]  3D Planning Note   Prostate Brachytherapy Post-Implant Dosimetry  Diagnosis: 70 y.o. man with stage T1c adenocarcinoma of the prostate with a Gleason's score of 3+4 and a PSA of 4.3  Narrative: On a previous date, GEMINI BLANCHETT returned following prostate seed implantation for post implant planning. He underwent CT scan complex simulation to delineate the three-dimensional structures of the pelvis and demonstrate the radiation distribution.  Since that time, the seed localization, and complex isodose planning with dose volume histograms have now been completed.  Results:   Prostate Coverage - The dose of radiation delivered to the 90% or more of the prostate gland (D90) was 119.7% of the prescription dose. This exceeds our goal of greater than 90%. Rectal Sparing - The volume of rectal tissue receiving the prescription dose or higher was 0.0 cc. This falls under our thresholds tolerance of 1.0 cc.  Impression: The prostate seed implant appears to show adequate target coverage and appropriate rectal sparing.  Plan:  The patient will continue to follow with urology for ongoing PSA determinations. I would anticipate a high likelihood for local tumor control with minimal risk for rectal morbidity.  ________________________________  Sheral Apley Tammi Klippel, M.D.

## 2016-02-06 DIAGNOSIS — M0579 Rheumatoid arthritis with rheumatoid factor of multiple sites without organ or systems involvement: Secondary | ICD-10-CM | POA: Diagnosis not present

## 2016-02-06 DIAGNOSIS — M19241 Secondary osteoarthritis, right hand: Secondary | ICD-10-CM | POA: Diagnosis not present

## 2016-02-06 DIAGNOSIS — Z09 Encounter for follow-up examination after completed treatment for conditions other than malignant neoplasm: Secondary | ICD-10-CM | POA: Diagnosis not present

## 2016-02-28 DIAGNOSIS — E559 Vitamin D deficiency, unspecified: Secondary | ICD-10-CM | POA: Diagnosis not present

## 2016-02-28 DIAGNOSIS — I10 Essential (primary) hypertension: Secondary | ICD-10-CM | POA: Diagnosis not present

## 2016-02-28 DIAGNOSIS — D509 Iron deficiency anemia, unspecified: Secondary | ICD-10-CM | POA: Diagnosis not present

## 2016-02-28 DIAGNOSIS — Z79899 Other long term (current) drug therapy: Secondary | ICD-10-CM | POA: Diagnosis not present

## 2016-02-28 DIAGNOSIS — N183 Chronic kidney disease, stage 3 (moderate): Secondary | ICD-10-CM | POA: Diagnosis not present

## 2016-02-28 DIAGNOSIS — R809 Proteinuria, unspecified: Secondary | ICD-10-CM | POA: Diagnosis not present

## 2016-03-13 DIAGNOSIS — E875 Hyperkalemia: Secondary | ICD-10-CM | POA: Diagnosis not present

## 2016-03-13 DIAGNOSIS — I10 Essential (primary) hypertension: Secondary | ICD-10-CM | POA: Diagnosis not present

## 2016-03-13 DIAGNOSIS — Z79899 Other long term (current) drug therapy: Secondary | ICD-10-CM | POA: Diagnosis not present

## 2016-03-13 DIAGNOSIS — R809 Proteinuria, unspecified: Secondary | ICD-10-CM | POA: Diagnosis not present

## 2016-03-13 DIAGNOSIS — C61 Malignant neoplasm of prostate: Secondary | ICD-10-CM | POA: Diagnosis not present

## 2016-03-13 DIAGNOSIS — N183 Chronic kidney disease, stage 3 (moderate): Secondary | ICD-10-CM | POA: Diagnosis not present

## 2016-03-19 ENCOUNTER — Ambulatory Visit (INDEPENDENT_AMBULATORY_CARE_PROVIDER_SITE_OTHER): Payer: Self-pay | Admitting: Urology

## 2016-03-19 DIAGNOSIS — C61 Malignant neoplasm of prostate: Secondary | ICD-10-CM

## 2016-03-19 DIAGNOSIS — N320 Bladder-neck obstruction: Secondary | ICD-10-CM

## 2016-03-19 DIAGNOSIS — R300 Dysuria: Secondary | ICD-10-CM

## 2016-03-19 DIAGNOSIS — R972 Elevated prostate specific antigen [PSA]: Secondary | ICD-10-CM | POA: Diagnosis not present

## 2016-04-08 DIAGNOSIS — Z79899 Other long term (current) drug therapy: Secondary | ICD-10-CM | POA: Diagnosis not present

## 2016-04-08 DIAGNOSIS — N183 Chronic kidney disease, stage 3 (moderate): Secondary | ICD-10-CM | POA: Diagnosis not present

## 2016-04-30 ENCOUNTER — Ambulatory Visit: Payer: Commercial Managed Care - HMO | Admitting: Cardiology

## 2016-05-14 DIAGNOSIS — M1000 Idiopathic gout, unspecified site: Secondary | ICD-10-CM | POA: Diagnosis not present

## 2016-05-14 DIAGNOSIS — M129 Arthropathy, unspecified: Secondary | ICD-10-CM | POA: Diagnosis not present

## 2016-05-22 DIAGNOSIS — Z79899 Other long term (current) drug therapy: Secondary | ICD-10-CM | POA: Diagnosis not present

## 2016-06-11 DIAGNOSIS — Z6829 Body mass index (BMI) 29.0-29.9, adult: Secondary | ICD-10-CM | POA: Diagnosis not present

## 2016-06-11 DIAGNOSIS — I10 Essential (primary) hypertension: Secondary | ICD-10-CM | POA: Diagnosis not present

## 2016-06-11 DIAGNOSIS — Z1389 Encounter for screening for other disorder: Secondary | ICD-10-CM | POA: Diagnosis not present

## 2016-06-11 DIAGNOSIS — Z0001 Encounter for general adult medical examination with abnormal findings: Secondary | ICD-10-CM | POA: Diagnosis not present

## 2016-06-11 DIAGNOSIS — M0689 Other specified rheumatoid arthritis, multiple sites: Secondary | ICD-10-CM | POA: Diagnosis not present

## 2016-06-11 DIAGNOSIS — N39498 Other specified urinary incontinence: Secondary | ICD-10-CM | POA: Diagnosis not present

## 2016-06-11 DIAGNOSIS — E1129 Type 2 diabetes mellitus with other diabetic kidney complication: Secondary | ICD-10-CM | POA: Diagnosis not present

## 2016-06-11 DIAGNOSIS — E663 Overweight: Secondary | ICD-10-CM | POA: Diagnosis not present

## 2016-06-11 DIAGNOSIS — C61 Malignant neoplasm of prostate: Secondary | ICD-10-CM | POA: Diagnosis not present

## 2016-06-13 DIAGNOSIS — D6959 Other secondary thrombocytopenia: Secondary | ICD-10-CM | POA: Diagnosis not present

## 2016-06-14 ENCOUNTER — Ambulatory Visit (INDEPENDENT_AMBULATORY_CARE_PROVIDER_SITE_OTHER): Payer: Commercial Managed Care - HMO | Admitting: Cardiology

## 2016-06-14 ENCOUNTER — Encounter: Payer: Self-pay | Admitting: Cardiology

## 2016-06-14 VITALS — BP 136/70 | HR 60 | Ht 70.0 in | Wt 200.0 lb

## 2016-06-14 DIAGNOSIS — I2510 Atherosclerotic heart disease of native coronary artery without angina pectoris: Secondary | ICD-10-CM | POA: Diagnosis not present

## 2016-06-14 DIAGNOSIS — E785 Hyperlipidemia, unspecified: Secondary | ICD-10-CM

## 2016-06-14 DIAGNOSIS — I10 Essential (primary) hypertension: Secondary | ICD-10-CM | POA: Diagnosis not present

## 2016-06-14 NOTE — Progress Notes (Signed)
Clinical Summary Mr. Heinle is a 70 y.o.male seen today for follow up of the following medical problems.    1. CAD - multiple interventions as described below - normal LV function per prior clinic notes, there is no documented study in our sytem  - denies any recent chest pain. No SOB or DOE - compliant with meds  2. HTN - compliant with meds - does not check bp at home  3. Hyperlipidemia - compliant with atrovastatin  4. CKD - followed by nephrology  5. Prediabetes - reports previously mildly increased HgbA1c that has gone back to normal, on glimepride.  6. Prostate CA - had radioactive seeds placed, followed by urology.    SH: Daughter lives in [place]. Has stepson that is a Mudlogger. He is former marine.    Past Medical History  Diagnosis Date  . Arteriosclerotic cardiovascular disease (ASCVD) cardiologist-  dr Roderic Palau Abdo Denault    Acute myocardial infarction treated with TPA in 1990; 1999-stents to circumflex and RCA; residual total occlusion of the left anterior descending; normal ejection fraction; stress nuclear in 2001-distal anteroseptal ischemia; normal LV function  . Hypertension   . Hyperlipidemia   . Sinus bradycardia   . First degree heart block   . History of MI (myocardial infarction)     1990-  treated w/ TPA  . S/P drug eluting coronary stent placement     1999--  DES x2 to CFX and RCA  . History of gastric ulcer   . Prostate cancer [facility]) urologist-- dr dahlstedt/  oncologist- dr Tammi Klippel    Stage T1c , Gleason 3+4,  PSA 4.7,  vol 24cc--  scheduled for radiative seed implants  . Prediabetes   . Psoriatic arthritis ([place])   . RA (rheumatoid arthritis) ([place])     rheumologist-  dr Toni Amend  . OA (osteoarthritis)   . Chronic kidney disease, stage 3, mod decreased GFR nephrologist-  at [place] in [place]    Creatinine-2.18 and 12/2007, 1.76 and 12/2008  . Wears dentures     upper and lower partial     No Known  Allergies   Current Outpatient Prescriptions  Medication Sig Dispense Refill  . adalimumab (HUMIRA) 40 MG/0.8ML injection Inject 40 mg into the skin every 14 (fourteen) days.      Marland Kitchen amLODipine (NORVASC) 5 MG tablet Take 1 tablet (5 mg total) by mouth daily. (Patient taking differently: Take 5 mg by mouth every morning. ) 90 tablet 3  . aspirin 81 MG tablet Take 81 mg by mouth every other day.     Marland Kitchen atorvastatin (LIPITOR) 80 MG tablet Take 1 tablet (80 mg total) by mouth daily. (Patient taking differently: Take 80 mg by mouth every evening. ) 90 tablet 3  . fish oil-omega-3 fatty acids 1000 MG capsule Take 1 capsule by mouth 2 (two) times daily.      Marland Kitchen glimepiride (AMARYL) 1 MG tablet Take 1 mg by mouth daily before breakfast.    . metoprolol (LOPRESSOR) 50 MG tablet TAKE ONE TABLET BY MOUTH TWICE DAILY 60 tablet 6   No current facility-administered medications for this visit.     Past Surgical History  Procedure Laterality Date  . Cardiovascular stress test  2001  per Dr Lattie Haw clinic note    distal anteroseptal ischemia,  normal LVF  . Coronary angioplasty with stent placement  1999    DES x2  to CFX and RCA/  residual total occlusion LAD,  normal LVEF  .  Colonoscopy  last one 2006 (approx)  . Radioactive seed implant N/A 01/11/2016    Procedure: RADIOACTIVE SEED IMPLANT/BRACHYTHERAPY IMPLANT;  Surgeon: Franchot Gallo, MD;  Location: [facility];  Service: Urology;  Laterality: N/A;   71  seeds implanted no seeds founds in bladder     No Known Allergies    Family History  Problem Relation Age of Onset  . Heart attack Mother   . Coronary artery disease Father      Social History Mr. Deronde reports that he quit smoking about 27 years ago. His smoking use included Cigarettes. He has a 30 pack-year smoking history. He has never used smokeless tobacco. Mr. Labarca reports that he drinks about 21.0 oz of alcohol per week.   Review of  Systems CONSTITUTIONAL: No weight loss, fever, chills, weakness or fatigue.  HEENT: Eyes: No visual loss, blurred vision, double vision or yellow sclerae.No hearing loss, sneezing, congestion, runny nose or sore throat.  SKIN: No rash or itching.  CARDIOVASCULAR: per HPI RESPIRATORY: No shortness of breath, cough or sputum.  GASTROINTESTINAL: No anorexia, nausea, vomiting or diarrhea. No abdominal pain or blood.  GENITOURINARY: No burning on urination, no polyuria NEUROLOGICAL: No headache, dizziness, syncope, paralysis, ataxia, numbness or tingling in the extremities. No change in bowel or bladder control.  MUSCULOSKELETAL: No muscle, back pain, joint pain or stiffness.  LYMPHATICS: No enlarged nodes. No history of splenectomy.  PSYCHIATRIC: No history of depression or anxiety.  ENDOCRINOLOGIC: No reports of sweating, cold or heat intolerance. No polyuria or polydipsia.  Marland Kitchen   Physical Examination Filed Vitals:   06/14/16 0953  BP: 136/70  Pulse: 60   Filed Vitals:   06/14/16 0953  Height: 5\' 10"  (1.778 m)  Weight: 200 lb (90.719 kg)    Gen: resting comfortably, no acute distress HEENT: no scleral icterus, pupils equal round and reactive, no palptable cervical adenopathy,  CV: RRR, no m/r/g, no jvd Resp: Clear to auscultation bilaterally GI: abdomen is soft, non-tender, non-distended, normal bowel sounds, no hepatosplenomegaly MSK: extremities are warm, no edema.  Skin: warm, no rash Neuro:  no focal deficits Psych: appropriate affect     Assessment and Plan   1. CAD - no current symptoms - we will continue current meds  2. HTN - at goal, continue current meds  3. Hyperlipidemia - continue high dose statin in setting of known CAD   F/u 1 year. Request labs from pcp.      Arnoldo Lenis, M.D.

## 2016-06-14 NOTE — Patient Instructions (Signed)
Medication Instructions:  Your physician recommends that you continue on your current medications as directed. Please refer to the Current Medication list given to you today.   Labwork: NONE  Testing/Procedures: NONE  Follow-Up: Your physician wants you to follow-up in: 1 YEAR .  You will receive a reminder letter in the mail two months in advance. If you don't receive a letter, please call our office to schedule the follow-up appointment.    Any Other Special Instructions Will Be Listed Below (If Applicable).  WILL REQUEST LABS FROM PCP.    If you need a refill on your cardiac medications before your next appointment, please call your pharmacy.

## 2016-06-17 ENCOUNTER — Other Ambulatory Visit: Payer: Self-pay

## 2016-06-20 DIAGNOSIS — Z79899 Other long term (current) drug therapy: Secondary | ICD-10-CM | POA: Diagnosis not present

## 2016-06-20 DIAGNOSIS — D509 Iron deficiency anemia, unspecified: Secondary | ICD-10-CM | POA: Diagnosis not present

## 2016-06-20 DIAGNOSIS — I10 Essential (primary) hypertension: Secondary | ICD-10-CM | POA: Diagnosis not present

## 2016-06-20 DIAGNOSIS — R809 Proteinuria, unspecified: Secondary | ICD-10-CM | POA: Diagnosis not present

## 2016-06-20 DIAGNOSIS — N183 Chronic kidney disease, stage 3 (moderate): Secondary | ICD-10-CM | POA: Diagnosis not present

## 2016-06-20 DIAGNOSIS — E559 Vitamin D deficiency, unspecified: Secondary | ICD-10-CM | POA: Diagnosis not present

## 2016-06-25 ENCOUNTER — Other Ambulatory Visit (HOSPITAL_COMMUNITY)
Admission: RE | Admit: 2016-06-25 | Discharge: 2016-06-25 | Disposition: A | Discharge status: Home / Self Care | Payer: Commercial Managed Care - HMO | Source: Other Acute Inpatient Hospital | Attending: Urology | Admitting: Urology

## 2016-06-25 ENCOUNTER — Ambulatory Visit (INDEPENDENT_AMBULATORY_CARE_PROVIDER_SITE_OTHER): Payer: Commercial Managed Care - HMO | Admitting: Urology

## 2016-06-25 DIAGNOSIS — N320 Bladder-neck obstruction: Secondary | ICD-10-CM

## 2016-06-25 DIAGNOSIS — R300 Dysuria: Secondary | ICD-10-CM | POA: Diagnosis not present

## 2016-06-25 DIAGNOSIS — C61 Malignant neoplasm of prostate: Secondary | ICD-10-CM | POA: Diagnosis not present

## 2016-06-25 LAB — URINALYSIS, ROUTINE W REFLEX MICROSCOPIC
Bilirubin Urine: NEGATIVE
Glucose, UA: NEGATIVE mg/dL
Hgb urine dipstick: NEGATIVE
Ketones, ur: NEGATIVE mg/dL
Leukocytes, UA: NEGATIVE
Nitrite: NEGATIVE
Protein, ur: 30 mg/dL — AB
Specific Gravity, Urine: 1.025 (ref 1.005–1.030)
pH: 6 (ref 5.0–8.0)

## 2016-06-25 LAB — URINE MICROSCOPIC-ADD ON: WBC, UA: NONE SEEN WBC/hpf (ref 0–5)

## 2016-06-27 DIAGNOSIS — I10 Essential (primary) hypertension: Secondary | ICD-10-CM | POA: Diagnosis not present

## 2016-06-27 DIAGNOSIS — E875 Hyperkalemia: Secondary | ICD-10-CM | POA: Diagnosis not present

## 2016-06-27 DIAGNOSIS — N183 Chronic kidney disease, stage 3 (moderate): Secondary | ICD-10-CM | POA: Diagnosis not present

## 2016-06-27 DIAGNOSIS — R809 Proteinuria, unspecified: Secondary | ICD-10-CM | POA: Diagnosis not present

## 2016-07-10 ENCOUNTER — Encounter: Payer: Self-pay | Admitting: Cardiology

## 2016-07-16 DIAGNOSIS — D696 Thrombocytopenia, unspecified: Secondary | ICD-10-CM | POA: Diagnosis not present

## 2016-08-01 ENCOUNTER — Other Ambulatory Visit (HOSPITAL_COMMUNITY): Payer: Self-pay | Admitting: Internal Medicine

## 2016-08-01 ENCOUNTER — Ambulatory Visit (HOSPITAL_COMMUNITY)
Admission: RE | Admit: 2016-08-01 | Discharge: 2016-08-01 | Disposition: A | Discharge status: Home / Self Care | Payer: Commercial Managed Care - HMO | Source: Ambulatory Visit | Attending: Internal Medicine | Admitting: Internal Medicine

## 2016-08-01 DIAGNOSIS — Z6829 Body mass index (BMI) 29.0-29.9, adult: Secondary | ICD-10-CM | POA: Diagnosis not present

## 2016-08-01 DIAGNOSIS — Z1389 Encounter for screening for other disorder: Secondary | ICD-10-CM | POA: Diagnosis not present

## 2016-08-01 DIAGNOSIS — I700 Atherosclerosis of aorta: Secondary | ICD-10-CM | POA: Diagnosis not present

## 2016-08-01 DIAGNOSIS — I495 Sick sinus syndrome: Secondary | ICD-10-CM | POA: Diagnosis not present

## 2016-08-01 DIAGNOSIS — M549 Dorsalgia, unspecified: Secondary | ICD-10-CM | POA: Insufficient documentation

## 2016-08-01 DIAGNOSIS — M5136 Other intervertebral disc degeneration, lumbar region: Secondary | ICD-10-CM | POA: Insufficient documentation

## 2016-08-01 DIAGNOSIS — C61 Malignant neoplasm of prostate: Secondary | ICD-10-CM | POA: Diagnosis not present

## 2016-08-01 DIAGNOSIS — M5417 Radiculopathy, lumbosacral region: Secondary | ICD-10-CM | POA: Diagnosis not present

## 2016-08-01 DIAGNOSIS — I10 Essential (primary) hypertension: Secondary | ICD-10-CM | POA: Diagnosis not present

## 2016-08-14 DIAGNOSIS — M1A00X0 Idiopathic chronic gout, unspecified site, without tophus (tophi): Secondary | ICD-10-CM | POA: Diagnosis not present

## 2016-08-14 DIAGNOSIS — Z09 Encounter for follow-up examination after completed treatment for conditions other than malignant neoplasm: Secondary | ICD-10-CM | POA: Diagnosis not present

## 2016-08-14 DIAGNOSIS — M0579 Rheumatoid arthritis with rheumatoid factor of multiple sites without organ or systems involvement: Secondary | ICD-10-CM | POA: Diagnosis not present

## 2016-08-14 DIAGNOSIS — M545 Low back pain: Secondary | ICD-10-CM | POA: Diagnosis not present

## 2016-08-14 DIAGNOSIS — Z79899 Other long term (current) drug therapy: Secondary | ICD-10-CM | POA: Diagnosis not present

## 2016-08-21 ENCOUNTER — Ambulatory Visit (HOSPITAL_COMMUNITY): Payer: Commercial Managed Care - HMO | Attending: Rheumatology | Admitting: Physical Therapy

## 2016-08-21 DIAGNOSIS — M6281 Muscle weakness (generalized): Secondary | ICD-10-CM

## 2016-08-21 DIAGNOSIS — M545 Low back pain: Secondary | ICD-10-CM | POA: Diagnosis not present

## 2016-08-21 DIAGNOSIS — R293 Abnormal posture: Secondary | ICD-10-CM

## 2016-08-21 DIAGNOSIS — R29898 Other symptoms and signs involving the musculoskeletal system: Secondary | ICD-10-CM

## 2016-08-21 NOTE — Patient Instructions (Signed)
    SINGLE KNEE TO CHEST STRETCH - [place]  While Lying on your back, hold your knee and gently pull it up towards your chest.  Hold for 10 seconds and relax; repeat 5 times each side, twice a day.    Lumbar Rotations   Lying on your back with your knees bent, slowly drop your legs to one side and hold the stretch. Come back to the middle and switch sides. You should feel the stretch in your back on the opposite side that your legs are leaning.   Hold for 10 seconds and relax; repeat 5 times each side, twice a day.

## 2016-08-21 NOTE — Therapy (Signed)
[place], [place], [address] Phone: [phone]   Fax:  [phone]  Physical Therapy Evaluation  Patient Details  Name: Jacob Farmer MRN: [id number] Date of Birth: 1946/01/07 Referring Provider: Bo Merino   Encounter Date: 08/21/2016      PT End of Session - 08/21/16 1625    Visit Number 1   Number of Visits 12   Date for PT Re-Evaluation 09/11/16   Authorization Type [employer]    Authorization Time Period 08/21/16 to 10/02/16   Authorization - Visit Number 1   Authorization - Number of Visits 10   PT Start Time 1302   PT Stop Time 1340   PT Time Calculation (min) 38 min   Activity Tolerance Patient tolerated treatment well   Behavior During Therapy [facility] for tasks assessed/performed      Past Medical History:  Diagnosis Date  . Arteriosclerotic cardiovascular disease (ASCVD) cardiologist-  dr Roderic [place] branch   Acute myocardial infarction treated with TPA in 1990; 1999-stents to circumflex and RCA; residual total occlusion of the left anterior descending; normal ejection fraction; stress nuclear in 2001-distal anteroseptal ischemia; normal LV function  . Chronic kidney disease, stage 3, mod decreased GFR nephrologist-  at [facility] in [place]   Creatinine-2.18 and 12/2007, 1.76 and 12/2008  . First degree heart block   . History of gastric ulcer   . History of MI (myocardial infarction)    1990-  treated w/ TPA  . Hyperlipidemia   . Hypertension   . OA (osteoarthritis)   . Prediabetes   . Prostate cancer [facility]) urologist-- dr dahlstedt/  oncologist- dr Tammi Klippel   Stage T1c , Gleason 3+4,  PSA 4.7,  vol 24cc--  scheduled for radiative seed implants  . Psoriatic arthritis ([place])   . RA (rheumatoid arthritis) ([place])    rheumologist-  dr Toni Amend  . S/P drug eluting coronary stent placement    1999--  DES x2 to CFX and RCA  . Sinus bradycardia   . Wears dentures    upper and  lower partial    Past Surgical History:  Procedure Laterality Date  . CARDIOVASCULAR STRESS TEST  2001  per Dr Lattie Haw clinic note   distal anteroseptal ischemia,  normal LVF  . COLONOSCOPY  last one 2006 (approx)  . CORONARY ANGIOPLASTY WITH STENT PLACEMENT  1999   DES x2  to CFX and RCA/  residual total occlusion LAD,  normal LVEF  . RADIOACTIVE SEED IMPLANT N/A 01/11/2016   Procedure: RADIOACTIVE SEED IMPLANT/BRACHYTHERAPY IMPLANT;  Surgeon: Franchot Gallo, MD;  Location: [facility];  Service: Urology;  Laterality: N/A;   73  seeds implanted no seeds founds in bladder    There were no vitals filed for this visit.       Subjective Assessment - 08/21/16 1307    Subjective Patient had radioactive seeds implanted in his prostate in January and he is wondering if his back pain is stemming from this; he saw some MDs, who told him that he does have some DDD but not too bad. His MD wants to see if PT helps his pain and if not he will get an MRI. His pain starts in his back right above his L cheek, and can run down the back of his leg to his knee, but other times can go up from his knee to his back. It feels like a cramp that just won't release and happens at night,  usually not during the day. His pain is keeping him awake at night, he sleeps for 2-3 hours before waking up due to urinary urgency and pain. No falls or close calls recently. All of this started around 7-8 weeks ago and started really bad but has gotten better; he states that his prostate numbers have been good and was 0.44 last time he had it checked in August.    Pertinent History on beta-blockers, HTN, sinus bradycardia, DM, RA, CKD, history of prostate cancer with placement of radioactive seeds in January 2017, hx of MI with cardiac stent placement    How long can you sit comfortably? sitting on barstool can make it feel better    How long can you stand comfortably? unlimited, tends to relieve pain    How long  can you walk comfortably? unlimited    Diagnostic tests see images done in August 2017   Patient Stated Goals find out what is causing pain, get rid of pain    Currently in Pain? Yes   Pain Score 1    Pain Location Buttocks   Pain Orientation Left   Pain Descriptors / Indicators Cramping;Tingling   Pain Type Chronic pain   Pain Radiating Towards running down L LE    Pain Onset More than a month ago   Pain Frequency Intermittent   Aggravating Factors  not sure    Pain Relieving Factors standing up and walking    Effect of Pain on Daily Activities affects sleeping patterns             [facility] PT Assessment - 08/21/16 0001      Assessment   Medical Diagnosis LBP/DDD   Referring Provider Bo Merino    Onset Date/Surgical Date --  couple of months ago    Next MD Visit Dr. Estanislado Pandy in February      [place]   Has the patient fallen in the past 6 months No   Has the patient had a decrease in activity level because of a fear of falling?  No   Is the patient reluctant to leave their home because of a fear of falling?  No     Prior Function   Level of Independence Independent;Independent with basic ADLs;Independent with gait;Independent with transfers   Vocation Retired   Leisure wood working      Observation/Other Assessments   Observations FABER/SLR/Scour (-) B    Focus on Therapeutic Outcomes (FOTO)  2% limited      Posture/Postural Control   Posture/Postural Control Postural limitations   Postural Limitations Rounded Shoulders;Forward head     AROM   Overall AROM Comments moderate stiffness noted in B hips    Lumbar Flexion fingertips to floor  no changes with repeated movements    Lumbar Extension WFL, some compensation with hip extension   no changes with repeated movements    Lumbar - Right Side Bend [facility]    Lumbar - Left Side Bend WFL    Lumbar - Right Rotation moderate limitation    Lumbar - Left Rotation moderate limitation      Strength   Right  Hip Flexion 4/5   Right Hip Extension 3+/5   Right Hip ABduction 4+/5   Left Hip Flexion 4/5   Left Hip Extension 3+/5   Left Hip ABduction 4/5   Right Knee Flexion 4+/5   Right Knee Extension 4+/5   Left Knee Flexion 4+/5   Left Knee Extension 4+/5   Right Ankle Dorsiflexion 5/5  Left Ankle Dorsiflexion 5/5     Flexibility   Hamstrings moderate stiffness B    Piriformis moderate stiffness B      Palpation   Palpation comment mild stiffness with PAs, some muscle knotting noted distal L lumbar paraspinals, tenderness L piriformis muscle belly      Ambulation/Gait   Gait Comments proximal muscle weakness, lumbar stiffness      6 minute walk test results    Aerobic Endurance Distance [id number]   Endurance additional comments 3MWT                            PT Education - 08/21/16 1625    Education provided Yes   Education Details prognosis, POC, HEP    Person(s) Educated Patient   Methods Explanation;Demonstration;Handout   Comprehension Verbalized understanding;Returned demonstration;Need further instruction          PT Short Term Goals - 08/21/16 1629      PT SHORT TERM GOAL #1   Title Patient to be able to maintain correct posture at least 75% of the time in order to improve mechanics and reduce pain    Time 3   Period Weeks   Status New     PT SHORT TERM GOAL #2   Title Patient to demonstrate only mild limitation in bilateral hips and lumbar spine in order to assist in reducing pain and improving mechanics    Time 3   Period Weeks   Status New     PT SHORT TERM GOAL #3   Title Patient to be able to sleep 4-5 hours before waking up due to pain in order to improve general QOL    Time 3   Period Weeks   Status New     PT SHORT TERM GOAL #4   Title Patient to perform appropriate HEP correctly and consistently, to be updated PRN    Time 3   Period Weeks   Status New           PT Long Term Goals - 08/21/16 1631      PT LONG TERM  GOAL #1   Title Patient to demonstrate strength 5/5 in all tested groups in order to improve general mechanics and functional task performance    Time 6   Period Weeks   Status New     PT LONG TERM GOAL #2   Title Patient to report he has been able to sleep through the night without being woken due to pain in order to improve general QOL    Time 6   Period Weeks   Status New     PT LONG TERM GOAL #3   Title Patient to demonstrate correct functional lifting mechanics to assist in prevent re-occurrence or exacerbation of pain    Time 6   Period Weeks   Status New     PT LONG TERM GOAL #4   Title Patient to be pariticipatory in regular aerobic exercise program, at least 20 minutes in duration and 4 days per week, in order to maintain functional gains and improve general health status    Time 6   Period Weeks   Status New               Plan - 08/21/16 1626    Clinical Impression Statement Patient arrives today with recent onset of L sided low back pain that started approximately 2 months ago; he states that he has  noticed the pain primarily at night and that it is eased by standing up and walking, also by massaging the L side of his low back, however he is concerned that it could be related to his prostate radioactive seeds. Upon examination, patient reveals postural limitations, functional weakness, moderate lumbar and bilateral hip stiffness, tenderness in low back and L hip musculature, and fluctuating back pain. No change in pain with repeated motions testing. While DPT was unable to reproduce his specific pain during evaluation, based on the pain patterns and pain relieving factors patient describes, it is likely that patient's complaints of pain are likely musculoskeletal and can be addressed by skilled PT services. At this point recommend skilled PT services in order to address functional limitations and to assist in reaching optimal level of function.    Rehab Potential Good    PT Frequency 2x / week   PT Duration 6 weeks   PT Treatment/Interventions ADLs/Self Care Home Management;Cryotherapy;Moist Heat;Biofeedback;Gait training;Stair training;Functional mobility training;Therapeutic activities;Therapeutic exercise;Balance training;Neuromuscular re-education;Patient/family education;Manual techniques;Energy conservation;Taping   PT Next Visit Plan review HEP and goals; may add 2-3 flexibilty/mobility exercises if compliance is good. Focus on flexibility/mobility of lumbar spine and hips, postural training.    PT Home Exercise Plan 9/6: SKTC, lumbar rotations    Consulted and Agree with Plan of Care Patient      Patient will benefit from skilled therapeutic intervention in order to improve the following deficits and impairments:  Abnormal gait, Improper body mechanics, Pain, Decreased mobility, Postural dysfunction, Hypomobility, Decreased strength, Impaired flexibility  Visit Diagnosis: Midline low back pain, with sciatica presence unspecified - Plan: PT plan of care cert/re-cert  Abnormal posture - Plan: PT plan of care cert/re-cert  Other symptoms and signs involving the musculoskeletal system - Plan: PT plan of care cert/re-cert  Muscle weakness (generalized) - Plan: PT plan of care cert/re-cert      G-Codes - [id number]    Functional Assessment Tool Used Based on skilled clinical asessment of strength, posture, gait, ROM, flexibility, pain patterns    Functional Limitation Mobility: Walking and moving around   Mobility: Walking and Moving Around Current Status [id number]) At least 20 percent but less than 40 percent impaired, limited or restricted   Mobility: Walking and Moving Around Goal Status [phone]) At least 1 percent but less than 20 percent impaired, limited or restricted       Problem List Patient Active Problem List   Diagnosis Date Noted  . Malignant neoplasm of prostate ([place]) 11/17/2015  . Chronic kidney disease, stage 3, mod decreased GFR  12/27/2011  . Diabetes mellitus, type II ([place]) 12/27/2011  . Rheumatoid arthritis(714.0) 01/04/2011  . SINUS BRADYCARDIA 01/12/2010  . 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 HYPERLIPIDEMIA 01/08/2010  . Hypertension 01/08/2010    Deniece Ree PT, DPT [phone]  [place] [address] [place], [place], [address] Phone: [phone]   Fax:  [phone]  Name: Jacob Farmer MRN: [id number] Date of Birth: March 18, 1946

## 2016-08-27 ENCOUNTER — Ambulatory Visit (HOSPITAL_COMMUNITY): Payer: Commercial Managed Care - HMO | Admitting: Physical Therapy

## 2016-08-27 DIAGNOSIS — R29898 Other symptoms and signs involving the musculoskeletal system: Secondary | ICD-10-CM

## 2016-08-27 DIAGNOSIS — R293 Abnormal posture: Secondary | ICD-10-CM | POA: Diagnosis not present

## 2016-08-27 DIAGNOSIS — M6281 Muscle weakness (generalized): Secondary | ICD-10-CM | POA: Diagnosis not present

## 2016-08-27 DIAGNOSIS — M545 Low back pain: Secondary | ICD-10-CM

## 2016-08-27 NOTE — Therapy (Signed)
[place], [place], [address] Phone: [phone]   Fax:  [phone]  Physical Therapy Treatment  Patient Details  Name: Jacob Farmer MRN: [id number] Date of Birth: 02/26/1946 Referring Provider: Bo Merino   Encounter Date: 08/27/2016      PT End of Session - 08/27/16 0941    Visit Number 2   Number of Visits 12   Date for PT Re-Evaluation 09/11/16   Authorization Type [employer]    Authorization Time Period 08/21/16 to 10/02/16   Authorization - Visit Number 2   Authorization - Number of Visits 10   PT Start Time 0900   PT Stop Time 0940   PT Time Calculation (min) 40 min   Activity Tolerance Patient tolerated treatment well   Behavior During Therapy [facility] for tasks assessed/performed      Past Medical History:  Diagnosis Date  . Arteriosclerotic cardiovascular disease (ASCVD) cardiologist-  dr Roderic [place] branch   Acute myocardial infarction treated with TPA in 1990; 1999-stents to circumflex and RCA; residual total occlusion of the left anterior descending; normal ejection fraction; stress nuclear in 2001-distal anteroseptal ischemia; normal LV function  . Chronic kidney disease, stage 3, mod decreased GFR nephrologist-  at [facility] in [place]   Creatinine-2.18 and 12/2007, 1.76 and 12/2008  . First degree heart block   . History of gastric ulcer   . History of MI (myocardial infarction)    1990-  treated w/ TPA  . Hyperlipidemia   . Hypertension   . OA (osteoarthritis)   . Prediabetes   . Prostate cancer [facility]) urologist-- dr dahlstedt/  oncologist- dr Tammi Klippel   Stage T1c , Gleason 3+4,  PSA 4.7,  vol 24cc--  scheduled for radiative seed implants  . Psoriatic arthritis ([place])   . RA (rheumatoid arthritis) ([place])    rheumologist-  dr Toni Amend  . S/P drug eluting coronary stent placement    1999--  DES x2 to CFX and RCA  . Sinus bradycardia   . Wears dentures    upper and  lower partial    Past Surgical History:  Procedure Laterality Date  . CARDIOVASCULAR STRESS TEST  2001  per Dr Lattie Haw clinic note   distal anteroseptal ischemia,  normal LVF  . COLONOSCOPY  last one 2006 (approx)  . CORONARY ANGIOPLASTY WITH STENT PLACEMENT  1999   DES x2  to CFX and RCA/  residual total occlusion LAD,  normal LVEF  . RADIOACTIVE SEED IMPLANT N/A 01/11/2016   Procedure: RADIOACTIVE SEED IMPLANT/BRACHYTHERAPY IMPLANT;  Surgeon: Franchot Gallo, MD;  Location: [facility];  Service: Urology;  Laterality: N/A;   73  seeds implanted no seeds founds in bladder    There were no vitals filed for this visit.      Subjective Assessment - 08/27/16 0902    Subjective Patient arrives today stating he is feeling much better, he has been keeping up with his HEP daily and also is taking prednisone, he is very impressed with his pain relief right now.    Pertinent History on beta-blockers, HTN, sinus bradycardia, DM, RA, CKD, history of prostate cancer with placement of radioactive seeds in January 2017, hx of MI with cardiac stent placement    Currently in Pain? Yes   Pain Score 1    Pain Location Buttocks   Pain Orientation Left   Pain Descriptors / Indicators Cramping   Pain Type Chronic pain   Pain Radiating Towards  still running down L LE but it is better    Pain Onset More than a month ago   Pain Frequency Intermittent   Aggravating Factors  not sure    Pain Relieving Factors standing up and walking, exercises    Effect of Pain on Daily Activities none                          [facility] Adult PT Treatment/Exercise - 08/27/16 0001      Lumbar Exercises: Stretches   Active Hamstring Stretch 2 reps;30 seconds   Active Hamstring Stretch Limitations 12 inch box    Single Knee to Chest Stretch 5 reps;10 seconds   Lower Trunk Rotation 5 reps;10 seconds   Pelvic Tilt Limitations 12-6, tactile cues x10    Piriformis Stretch 2 reps;30 seconds    Piriformis Stretch Limitations seated      Lumbar Exercises: Standing   Scapular Retraction Strengthening;10 reps;Theraband   Theraband Level (Scapular Retraction) Level 2 (Red)   Shoulder Extension Strengthening;10 reps;Theraband   Theraband Level (Shoulder Extension) Level 2 (Red)     Lumbar Exercises: Supine   Ab Set 10 reps   AB Set Limitations 2 second holds    Bridge 10 reps   Other Supine Lumbar Exercises supine hip ABD 1x10 each, red TB                   Manual Therapy   Manual Therapy Soft tissue mobilization   Manual therapy comments performed separately from all other skilled services    Soft tissue mobilization L lumbar paraspinals, L buttocks in prone                 PT Education - 08/27/16 0941    Education provided Yes   Education Details reviewed initial eval and goals, encouraged regular physical activity    Person(s) Educated Patient   Methods Explanation   Comprehension Verbalized understanding          PT Short Term Goals - 08/21/16 1629      PT SHORT TERM GOAL #1   Title Patient to be able to maintain correct posture at least 75% of the time in order to improve mechanics and reduce pain    Time 3   Period Weeks   Status New     PT SHORT TERM GOAL #2   Title Patient to demonstrate only mild limitation in bilateral hips and lumbar spine in order to assist in reducing pain and improving mechanics    Time 3   Period Weeks   Status New     PT SHORT TERM GOAL #3   Title Patient to be able to sleep 4-5 hours before waking up due to pain in order to improve general QOL    Time 3   Period Weeks   Status New     PT SHORT TERM GOAL #4   Title Patient to perform appropriate HEP correctly and consistently, to be updated PRN    Time 3   Period Weeks   Status New           PT Long Term Goals - 08/21/16 1631      PT LONG TERM GOAL #1   Title Patient to demonstrate strength 5/5 in all tested groups in order to improve general mechanics  and functional task performance    Time 6   Period Weeks   Status New     PT LONG TERM  GOAL #2   Title Patient to report he has been able to sleep through the night without being woken due to pain in order to improve general QOL    Time 6   Period Weeks   Status New     PT LONG TERM GOAL #3   Title Patient to demonstrate correct functional lifting mechanics to assist in prevent re-occurrence or exacerbation of pain    Time 6   Period Weeks   Status New     PT LONG TERM GOAL #4   Title Patient to be pariticipatory in regular aerobic exercise program, at least 20 minutes in duration and 4 days per week, in order to maintain functional gains and improve general health status    Time 6   Period Weeks   Status New               Plan - 08/27/16 0942    Clinical Impression Statement Patient arrives today already being very satisfied with his pain relief; he states that he started prednisone and has been compliant with his HEP every day, and is already able to sleep through the night. Performed functional stretching as well as proximal strengthening and postural training techniques today, with good performance by patient with verbal and tactile cues provided as needed. Reviewed initial evaluation and goals after completing quick disclosure form at end of session. Cues provided for proper breathing strategy during exercise today.    Rehab Potential Good   PT Frequency 2x / week   PT Duration 6 weeks   PT Treatment/Interventions ADLs/Self Care Home Management;Cryotherapy;Moist Heat;Biofeedback;Gait training;Stair training;Functional mobility training;Therapeutic activities;Therapeutic exercise;Balance training;Neuromuscular re-education;Patient/family education;Manual techniques;Energy conservation;Taping   PT Next Visit Plan add 3D hip excursions, piriformis stretch to HEP; continue work on flexibiilty/mobility of lumbar spine/hips, posture and core work    PT Home Exercise Plan 9/6:  SKTC, lumbar rotations    Consulted and Agree with Plan of Care Patient      Patient will benefit from skilled therapeutic intervention in order to improve the following deficits and impairments:  Abnormal gait, Improper body mechanics, Pain, Decreased mobility, Postural dysfunction, Hypomobility, Decreased strength, Impaired flexibility  Visit Diagnosis: Midline low back pain, with sciatica presence unspecified  Abnormal posture  Other symptoms and signs involving the musculoskeletal system  Muscle weakness (generalized)     Problem List Patient Active Problem List   Diagnosis Date Noted  . Malignant neoplasm of prostate ([place]) 11/17/2015  . Chronic kidney disease, stage 3, mod decreased GFR 12/27/2011  . Diabetes mellitus, type II ([place]) 12/27/2011  . Rheumatoid arthritis(714.0) 01/04/2011  . SINUS BRADYCARDIA 01/12/2010  . 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 HYPERLIPIDEMIA 01/08/2010  . Hypertension 01/08/2010    Deniece Ree PT, DPT [phone]  [facility] [address] [place], [place], [address] Phone: [phone]   Fax:  [phone]  Name: Jacob Farmer MRN: [id number] Date of Birth: 02-Jun-1946

## 2016-08-29 ENCOUNTER — Ambulatory Visit (HOSPITAL_COMMUNITY): Payer: Commercial Managed Care - HMO

## 2016-08-29 DIAGNOSIS — R29898 Other symptoms and signs involving the musculoskeletal system: Secondary | ICD-10-CM | POA: Diagnosis not present

## 2016-08-29 DIAGNOSIS — M6281 Muscle weakness (generalized): Secondary | ICD-10-CM | POA: Diagnosis not present

## 2016-08-29 DIAGNOSIS — R293 Abnormal posture: Secondary | ICD-10-CM | POA: Diagnosis not present

## 2016-08-29 DIAGNOSIS — M545 Low back pain: Secondary | ICD-10-CM | POA: Diagnosis not present

## 2016-08-29 NOTE — Patient Instructions (Signed)
Hip Stretch    Put right ankle over left knee. Let right knee fall downward, but keep ankle in place. Feel the stretch in hip. May push down gently with hand to feel stretch. Hold 30 seconds while counting out loud. Repeat with other leg. Repeat 3 times. Do 1-2 sessions per day.  [email or web address].exer.[email or web address]   Copyright  VHI. All rights reserved.

## 2016-08-29 NOTE — Therapy (Signed)
[place], [place], [address] Phone: [phone]   Fax:  [phone]  Physical Therapy Treatment  Patient Details  Name: Jacob Farmer MRN: [id number] Date of Birth: [birth year] Referring Provider: Bo Merino   Encounter Date: 08/29/2016      PT End of Session - 08/29/16 1127    Visit Number 3   Number of Visits 12   Date for PT Re-Evaluation 09/11/16   Authorization Type [employer]    Authorization Time Period 08/21/16 to 10/02/16   Authorization - Visit Number 3   Authorization - Number of Visits 10   PT Start Time 3419   PT Stop Time 1204   PT Time Calculation (min) 43 min   Activity Tolerance Patient tolerated treatment well   Behavior During Therapy [facility] for tasks assessed/performed      Past Medical History:  Diagnosis Date  . Arteriosclerotic cardiovascular disease (ASCVD) cardiologist-  dr Roderic [place]au branch   Acute myocardial infarction treated with TPA in 1990; 1999-stents to circumflex and RCA; residual total occlusion of the left anterior descending; normal ejection fraction; stress nuclear in 2001-distal anteroseptal ischemia; normal LV function  . Chronic kidney disease, stage 3, mod decreased GFR nephrologist-  at [facility] in [place]   Creatinine-2.18 and 12/2007, 1.76 and 12/2008  . First degree heart block   . History of gastric ulcer   . History of MI (myocardial infarction)    1990-  treated w/ TPA  . Hyperlipidemia   . Hypertension   . OA (osteoarthritis)   . Prediabetes   . Prostate cancer [facility]) urologist-- dr dahlstedt/  oncologist- dr Tammi Klippel   Stage T1c , Gleason 3+4,  PSA 4.7,  vol 24cc--  scheduled for radiative seed implants  . Psoriatic arthritis ([place])   . RA (rheumatoid arthritis) ([place])    rheumologist-  dr Toni Amend  . S/P drug eluting coronary stent placement    1999--  DES x2 to CFX and RCA  . Sinus bradycardia   . Wears dentures    upper and  lower partial    Past Surgical History:  Procedure Laterality Date  . CARDIOVASCULAR STRESS TEST  2001  per Dr Lattie Haw clinic note   distal anteroseptal ischemia,  normal LVF  . COLONOSCOPY  last one 2006 (approx)  . CORONARY ANGIOPLASTY WITH STENT PLACEMENT  1999   DES x2  to CFX and RCA/  residual total occlusion LAD,  normal LVEF  . RADIOACTIVE SEED IMPLANT N/A 01/11/2016   Procedure: RADIOACTIVE SEED IMPLANT/BRACHYTHERAPY IMPLANT;  Surgeon: Franchot Gallo, MD;  Location: [facility];  Service: Urology;  Laterality: N/A;   73  seeds implanted no seeds founds in bladder    There were no vitals filed for this visit.      Subjective Assessment - 08/29/16 1126    Subjective Pt stated he is feeling good today, reports compliance with HEP and feels better following    Pertinent History on beta-blockers, HTN, sinus bradycardia, DM, RA, CKD, history of prostate cancer with placement of radioactive seeds in January 2017, hx of MI with cardiac stent placement    Patient Stated Goals find out what is causing pain, get rid of pain    Currently in Pain? No/denies                         [facility] Adult PT Treatment/Exercise - 08/29/16 0001  Lumbar Exercises: Stretches   Active Hamstring Stretch 3 reps;30 seconds   Active Hamstring Stretch Limitations supine with rope   Single Knee to Chest Stretch 5 reps;10 seconds   Lower Trunk Rotation 5 reps;10 seconds   Piriformis Stretch 2 reps;30 seconds   Piriformis Stretch Limitations seated      Lumbar Exercises: Standing   Functional Squats 10 reps   Functional Squats Limitations 3D hip excursion   Scapular Retraction Limitations resume next tx     Lumbar Exercises: Supine   Ab Set 10 reps   AB Set Limitations 2 second holds    Bridge 15 reps     Lumbar Exercises: Sidelying   Hip Abduction 10 reps     Manual Therapy   Manual Therapy Soft tissue mobilization   Manual therapy comments performed  separately from all other skilled services    Soft tissue mobilization L lumbar paraspinals, L buttocks in prone                   PT Short Term Goals - 08/21/16 1629      PT SHORT TERM GOAL #1   Title Patient to be able to maintain correct posture at least 75% of the time in order to improve mechanics and reduce pain    Time 3   Period Weeks   Status New     PT SHORT TERM GOAL #2   Title Patient to demonstrate only mild limitation in bilateral hips and lumbar spine in order to assist in reducing pain and improving mechanics    Time 3   Period Weeks   Status New     PT SHORT TERM GOAL #3   Title Patient to be able to sleep 4-5 hours before waking up due to pain in order to improve general QOL    Time 3   Period Weeks   Status New     PT SHORT TERM GOAL #4   Title Patient to perform appropriate HEP correctly and consistently, to be updated PRN    Time 3   Period Weeks   Status New           PT Long Term Goals - 08/21/16 1631      PT LONG TERM GOAL #1   Title Patient to demonstrate strength 5/5 in all tested groups in order to improve general mechanics and functional task performance    Time 6   Period Weeks   Status New     PT LONG TERM GOAL #2   Title Patient to report he has been able to sleep through the night without being woken due to pain in order to improve general QOL    Time 6   Period Weeks   Status New     PT LONG TERM GOAL #3   Title Patient to demonstrate correct functional lifting mechanics to assist in prevent re-occurrence or exacerbation of pain    Time 6   Period Weeks   Status New     PT LONG TERM GOAL #4   Title Patient to be pariticipatory in regular aerobic exercise program, at least 20 minutes in duration and 4 days per week, in order to maintain functional gains and improve general health status    Time 6   Period Weeks   Status New               Plan - 08/29/16 1254    Clinical Impression Statement Session focus  on improving lumbar  and hip mobiltiy and improving proximal musculature strengthening.  Added 3D hip excursion to improve hip mobility.  Pt able to demonstrate appropriate form and technqiue with minimal cueing required.  Ended session with manual soft tissue mobilization technqiues to reduce overall tightness lumbar paraspinals and piriformis musculature.  Pt given additional HEP of piriformis stretch with ability to demonstrate and verbalize appropriate form and technique.  No reports of pain at EOS.   Rehab Potential Good   PT Frequency 2x / week   PT Duration 6 weeks   PT Treatment/Interventions ADLs/Self Care Home Management;Cryotherapy;Moist Heat;Biofeedback;Gait training;Stair training;Functional mobility training;Therapeutic activities;Therapeutic exercise;Balance training;Neuromuscular re-education;Patient/family education;Manual techniques;Energy conservation;Taping   PT Next Visit Plan Continue work on flexibiilty/mobility of lumbar spine/hips, posture and core work    PT Home Exercise Plan 9/6: [place], lumbar rotations; 08/29/2016: piriformis stretch and 3D hip excursion      Patient will benefit from skilled therapeutic intervention in order to improve the following deficits and impairments:  Abnormal gait, Improper body mechanics, Pain, Decreased mobility, Postural dysfunction, Hypomobility, Decreased strength, Impaired flexibility  Visit Diagnosis: Midline low back pain, with sciatica presence unspecified  Abnormal posture  Other symptoms and signs involving the musculoskeletal system  Muscle weakness (generalized)     Problem List Patient Active Problem List   Diagnosis Date Noted  . Malignant neoplasm of prostate ([place]) 11/17/2015  . Chronic kidney disease, stage 3, mod decreased GFR 12/27/2011  . Diabetes mellitus, type II ([place]) 12/27/2011  . Rheumatoid arthritis(714.0) 01/04/2011  . SINUS BRADYCARDIA 01/12/2010  . Arteriosclerotic cardiovascular disease (ASCVD)  01/12/2010  . HYPERLIPIDEMIA 01/08/2010  . Hypertension 01/08/2010    Aldona Lento 08/29/2016, 1:02 PM  [facility] [address] [place], [place], [address] Phone: [phone]   Fax:  [phone]  Name: Jacob Farmer MRN: [id number] Date of Birth: [birth year]

## 2016-09-03 ENCOUNTER — Ambulatory Visit (HOSPITAL_COMMUNITY): Payer: Commercial Managed Care - HMO | Admitting: Physical Therapy

## 2016-09-03 DIAGNOSIS — R29898 Other symptoms and signs involving the musculoskeletal system: Secondary | ICD-10-CM

## 2016-09-03 DIAGNOSIS — M545 Low back pain: Secondary | ICD-10-CM

## 2016-09-03 DIAGNOSIS — M6281 Muscle weakness (generalized): Secondary | ICD-10-CM | POA: Diagnosis not present

## 2016-09-03 DIAGNOSIS — R293 Abnormal posture: Secondary | ICD-10-CM

## 2016-09-03 NOTE — Therapy (Signed)
[place], [place], [address] Phone: [phone]   Fax:  [phone]  Physical Therapy Treatment  Patient Details  Name: Jacob Farmer MRN: [id number] Date of Birth: [birth year] Referring Provider: Bo Merino   Encounter Date: 09/03/2016      PT End of Session - 09/03/16 1224    Visit Number 4   Number of Visits 12   Date for PT Re-Evaluation 09/11/16   Authorization Type [employer]    Authorization Time Period 08/21/16 to 10/02/16   Authorization - Visit Number 4   Authorization - Number of Visits 10   PT Start Time 0867   PT Stop Time 1111   PT Time Calculation (min) 39 min   Activity Tolerance Patient tolerated treatment well   Behavior During Therapy [facility] for tasks assessed/performed      Past Medical History:  Diagnosis Date  . Arteriosclerotic cardiovascular disease (ASCVD) cardiologist-  dr Roderic Palau branch   Acute myocardial infarction treated with TPA in 1990; 1999-stents to circumflex and RCA; residual total occlusion of the left anterior descending; normal ejection fraction; stress nuclear in 2001-distal anteroseptal ischemia; normal LV function  . Chronic kidney disease, stage 3, mod decreased GFR nephrologist-  at [facility] in [place]   Creatinine-2.18 and 12/2007, 1.76 and 12/2008  . First degree heart block   . History of gastric ulcer   . History of MI (myocardial infarction)    1990-  treated w/ TPA  . Hyperlipidemia   . Hypertension   . OA (osteoarthritis)   . Prediabetes   . Prostate cancer [facility]) urologist-- dr dahlstedt/  oncologist- dr Tammi Klippel   Stage T1c , Gleason 3+4,  PSA 4.7,  vol 24cc--  scheduled for radiative seed implants  . Psoriatic arthritis ([place])   . RA (rheumatoid arthritis) ([place])    rheumologist-  dr Toni Amend  . S/P drug eluting coronary stent placement    1999--  DES x2 to CFX and RCA  . Sinus bradycardia   . Wears dentures    upper and  lower partial    Past Surgical History:  Procedure Laterality Date  . CARDIOVASCULAR STRESS TEST  2001  per Dr Lattie Haw clinic note   distal anteroseptal ischemia,  normal LVF  . COLONOSCOPY  last one 2006 (approx)  . CORONARY ANGIOPLASTY WITH STENT PLACEMENT  1999   DES x2  to CFX and RCA/  residual total occlusion LAD,  normal LVEF  . RADIOACTIVE SEED IMPLANT N/A 01/11/2016   Procedure: RADIOACTIVE SEED IMPLANT/BRACHYTHERAPY IMPLANT;  Surgeon: Franchot Gallo, MD;  Location: [facility];  Service: Urology;  Laterality: N/A;   73  seeds implanted no seeds founds in bladder    There were no vitals filed for this visit.      Subjective Assessment - 09/03/16 1034    Subjective Patient reports that he had a rough night last night, he was up a lot with prostate issues and his back just aching; his back still hurts but seems to be getting better in general. He has good days and bad days.    Pertinent History on beta-blockers, HTN, sinus bradycardia, DM, RA, CKD, history of prostate cancer with placement of radioactive seeds in January 2017, hx of MI with cardiac stent placement    Currently in Pain? Yes   Pain Score 1    Pain Location Buttocks   Pain Orientation Left   Pain Descriptors / Indicators Cramping  Pain Type Chronic pain   Pain Radiating Towards none, mostly in the butt    Pain Onset More than a month ago   Pain Frequency Intermittent   Aggravating Factors  not sure    Pain Relieving Factors standing up and walking, exercises    Effect of Pain on Daily Activities gets in the way of sleeping well, otherwise none                          [facility] Adult PT Treatment/Exercise - 09/03/16 0001      Lumbar Exercises: Stretches   Active Hamstring Stretch 3 reps;30 seconds   Active Hamstring Stretch Limitations 12 inch box    Single Knee to Chest Stretch 5 reps;10 seconds   Lower Trunk Rotation 5 reps;10 seconds   Pelvic Tilt Limitations --    Piriformis Stretch 2 reps;30 seconds   Piriformis Stretch Limitations seated      Lumbar Exercises: Standing   Forward Lunge 10 reps   Scapular Retraction Strengthening;15 reps   Theraband Level (Scapular Retraction) Level 2 (Red)   Shoulder Extension Strengthening;15 reps   Theraband Level (Shoulder Extension) Level 2 (Red)   Other Standing Lumbar Exercises forward step up 1x10, 4 inch box      Lumbar Exercises: Supine   Ab Set 15 reps   AB Set Limitations 2 second holds    Bridge 15 reps   Other Supine Lumbar Exercises supine hip ABD 1x10 each, red TB      Lumbar Exercises: Prone   Straight Leg Raise 10 reps                PT Education - 09/03/16 1224    Education provided Yes   Education Details updated HEP    Person(s) Educated Patient   Methods Explanation   Comprehension Verbalized understanding          PT Short Term Goals - 08/21/16 1629      PT SHORT TERM GOAL #1   Title Patient to be able to maintain correct posture at least 75% of the time in order to improve mechanics and reduce pain    Time 3   Period Weeks   Status New     PT SHORT TERM GOAL #2   Title Patient to demonstrate only mild limitation in bilateral hips and lumbar spine in order to assist in reducing pain and improving mechanics    Time 3   Period Weeks   Status New     PT SHORT TERM GOAL #3   Title Patient to be able to sleep 4-5 hours before waking up due to pain in order to improve general QOL    Time 3   Period Weeks   Status New     PT SHORT TERM GOAL #4   Title Patient to perform appropriate HEP correctly and consistently, to be updated PRN    Time 3   Period Weeks   Status New           PT Long Term Goals - 08/21/16 1631      PT LONG TERM GOAL #1   Title Patient to demonstrate strength 5/5 in all tested groups in order to improve general mechanics and functional task performance    Time 6   Period Weeks   Status New     PT LONG TERM GOAL #2   Title Patient  to report he has been able to sleep through the  night without being woken due to pain in order to improve general QOL    Time 6   Period Weeks   Status New     PT LONG TERM GOAL #3   Title Patient to demonstrate correct functional lifting mechanics to assist in prevent re-occurrence or exacerbation of pain    Time 6   Period Weeks   Status New     PT LONG TERM GOAL #4   Title Patient to be pariticipatory in regular aerobic exercise program, at least 20 minutes in duration and 4 days per week, in order to maintain functional gains and improve general health status    Time 6   Period Weeks   Status New               Plan - 09/03/16 1225    Clinical Impression Statement Continued with functional exercises today, introducing prone hip extensions and increased focus on CKC exercises for posture and core/proximal stability this session, with patient continuing to need tactile cues for appropriate activation of scapular retractor muscles. Patient appears to be making good progress with skilled PT services; updated HEP accordingly today.    Rehab Potential Good   PT Frequency 2x / week   PT Duration 6 weeks   PT Treatment/Interventions ADLs/Self Care Home Management;Cryotherapy;Moist Heat;Biofeedback;Gait training;Stair training;Functional mobility training;Therapeutic activities;Therapeutic exercise;Balance training;Neuromuscular re-education;Patient/family education;Manual techniques;Energy conservation;Taping   PT Next Visit Plan Continue work on flexibiilty/mobility of lumbar spine/hips, posture and core work. Continue to work on [employer] strength.    PT Home Exercise Plan 9/6: [place], lumbar rotations; 08/29/2016: piriformis stretch and 3D hip excursion   Consulted and Agree with Plan of Care Patient      Patient will benefit from skilled therapeutic intervention in order to improve the following deficits and impairments:  Abnormal gait, Improper body mechanics, Pain, Decreased mobility,  Postural dysfunction, Hypomobility, Decreased strength, Impaired flexibility  Visit Diagnosis: Midline low back pain, with sciatica presence unspecified  Abnormal posture  Other symptoms and signs involving the musculoskeletal system  Muscle weakness (generalized)     Problem List Patient Active Problem List   Diagnosis Date Noted  . Malignant neoplasm of prostate ([place]) 11/17/2015  . Chronic kidney disease, stage 3, mod decreased GFR 12/27/2011  . Diabetes mellitus, type II ([place]) 12/27/2011  . Rheumatoid arthritis(714.0) 01/04/2011  . SINUS BRADYCARDIA 01/12/2010  . 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 HYPERLIPIDEMIA 01/08/2010  . Hypertension 01/08/2010    Deniece Ree PT, DPT [phone]  [facility] [address] [place], [place], [address] Phone: [phone]   Fax:  [phone]  Name: OM LIZOTTE MRN: [id number] Date of Birth: [birth year]

## 2016-09-03 NOTE — Patient Instructions (Signed)
   BRIDGING  While lying on your back, tighten your lower abdominals, squeeze your buttocks and then raise your buttocks off the floor/bed as creating a "Bridge" with your body. Hold and then lower yourself.  Repeat 15 times, twice a day.     ELASTIC BAND SHOULDER EXTENSION  While holding an elastic band in front of you with your elbows straight, pull the band down and back towards your side.  Repeat 15 times, twice a day.    SHOULDER ROLLS  Move your shoulders in a circular pattern as shown so that your are moving in an up, back and down direction. Perform small cicles if needed for comfort.  Go backwards only for your posture.  Repeat 15 times, twice a day.

## 2016-09-06 ENCOUNTER — Ambulatory Visit (HOSPITAL_COMMUNITY): Payer: Commercial Managed Care - HMO

## 2016-09-06 DIAGNOSIS — R293 Abnormal posture: Secondary | ICD-10-CM | POA: Diagnosis not present

## 2016-09-06 DIAGNOSIS — R29898 Other symptoms and signs involving the musculoskeletal system: Secondary | ICD-10-CM | POA: Diagnosis not present

## 2016-09-06 DIAGNOSIS — M545 Low back pain: Secondary | ICD-10-CM

## 2016-09-06 DIAGNOSIS — M6281 Muscle weakness (generalized): Secondary | ICD-10-CM | POA: Diagnosis not present

## 2016-09-06 NOTE — Therapy (Signed)
[place], [place], [address] Phone: [phone]   Fax:  [phone]  Physical Therapy Treatment  Patient Details  Name: Jacob Farmer MRN: [id number] Date of Birth: August 23, 1946 Referring Provider: Bo Merino  Encounter Date: 09/06/2016      PT End of Session - 09/06/16 1124    Visit Number 5   Number of Visits 12   Date for PT Re-Evaluation 09/11/16   Authorization Type [employer]    Authorization Time Period 08/21/16 to 10/02/16   Authorization - Visit Number 5   Authorization - Number of Visits 10   PT Start Time 1120   PT Stop Time 1159   PT Time Calculation (min) 39 min   Activity Tolerance Patient tolerated treatment well   Behavior During Therapy [facility] for tasks assessed/performed      Past Medical History:  Diagnosis Date  . Arteriosclerotic cardiovascular disease (ASCVD) cardiologist-  dr Roderic Palau branch   Acute myocardial infarction treated with TPA in 1990; 1999-stents to circumflex and RCA; residual total occlusion of the left anterior descending; normal ejection fraction; stress nuclear in 2001-distal anteroseptal ischemia; normal LV function  . Chronic kidney disease, stage 3, mod decreased GFR nephrologist-  at [facility] in [place]   Creatinine-2.18 and 12/2007, 1.76 and 12/2008  . First degree heart block   . History of gastric ulcer   . History of MI (myocardial infarction)    1990-  treated w/ TPA  . Hyperlipidemia   . Hypertension   . OA (osteoarthritis)   . Prediabetes   . Prostate cancer [facility]) urologist-- dr dahlstedt/  oncologist- dr Tammi Klippel   Stage T1c , Gleason 3+4,  PSA 4.7,  vol 24cc--  scheduled for radiative seed implants  . Psoriatic arthritis ([place])   . RA (rheumatoid arthritis) ([place])    rheumologist-  dr Toni Amend  . S/P drug eluting coronary stent placement    1999--  DES x2 to CFX and RCA  . Sinus bradycardia   . Wears dentures    upper and  lower partial    Past Surgical History:  Procedure Laterality Date  . CARDIOVASCULAR STRESS TEST  2001  per Dr Lattie Haw clinic note   distal anteroseptal ischemia,  normal LVF  . COLONOSCOPY  last one 2006 (approx)  . CORONARY ANGIOPLASTY WITH STENT PLACEMENT  1999   DES x2  to CFX and RCA/  residual total occlusion LAD,  normal LVEF  . RADIOACTIVE SEED IMPLANT N/A 01/11/2016   Procedure: RADIOACTIVE SEED IMPLANT/BRACHYTHERAPY IMPLANT;  Surgeon: Franchot Gallo, MD;  Location: [facility];  Service: Urology;  Laterality: N/A;   73  seeds implanted no seeds founds in bladder    There were no vitals filed for this visit.      Subjective Assessment - 09/06/16 1123    Subjective Pt reports he is feeling good this morning, continues to have difficulty at night with prostate issues and has to get out of bed frequently.   Pertinent History on beta-blockers, HTN, sinus bradycardia, DM, RA, CKD, history of prostate cancer with placement of radioactive seeds in January 2017, hx of MI with cardiac stent placement    Patient Stated Goals find out what is causing pain, get rid of pain    Currently in Pain? No/denies            [facility] PT Assessment - 09/06/16 0001      Assessment   Medical Diagnosis LBP/DDD  Referring Provider Bo Merino   Onset Date/Surgical Date --  couple of months ago   Next MD Visit Dr. Estanislado Pandy in February                      [facility] Adult PT Treatment/Exercise - 09/06/16 0001      Lumbar Exercises: Stretches   Active Hamstring Stretch 3 reps;30 seconds   Active Hamstring Stretch Limitations supine with rope   Single Knee to Chest Stretch 5 reps;10 seconds   Single Knee to Chest Stretch Limitations HEP   Lower Trunk Rotation 5 reps;10 seconds   Lower Trunk Rotation Limitations HEP     Lumbar Exercises: Standing   Forward Lunge 10 reps   Forward Lunge Limitations 6in step without HHA for core activaiton/strengthening      Lumbar Exercises: Supine   Bridge 15 reps   Bridge Limitations HEP     Lumbar Exercises: Sidelying   Hip Abduction 10 reps     Lumbar Exercises: Prone   Straight Leg Raise 15 reps                  PT Short Term Goals - 08/21/16 1629      PT SHORT TERM GOAL #1   Title Patient to be able to maintain correct posture at least 75% of the time in order to improve mechanics and reduce pain    Time 3   Period Weeks   Status New     PT SHORT TERM GOAL #2   Title Patient to demonstrate only mild limitation in bilateral hips and lumbar spine in order to assist in reducing pain and improving mechanics    Time 3   Period Weeks   Status New     PT SHORT TERM GOAL #3   Title Patient to be able to sleep 4-5 hours before waking up due to pain in order to improve general QOL    Time 3   Period Weeks   Status New     PT SHORT TERM GOAL #4   Title Patient to perform appropriate HEP correctly and consistently, to be updated PRN    Time 3   Period Weeks   Status New           PT Long Term Goals - 08/21/16 1631      PT LONG TERM GOAL #1   Title Patient to demonstrate strength 5/5 in all tested groups in order to improve general mechanics and functional task performance    Time 6   Period Weeks   Status New     PT LONG TERM GOAL #2   Title Patient to report he has been able to sleep through the night without being woken due to pain in order to improve general QOL    Time 6   Period Weeks   Status New     PT LONG TERM GOAL #3   Title Patient to demonstrate correct functional lifting mechanics to assist in prevent re-occurrence or exacerbation of pain    Time 6   Period Weeks   Status New     PT LONG TERM GOAL #4   Title Patient to be pariticipatory in regular aerobic exercise program, at least 20 minutes in duration and 4 days per week, in order to maintain functional gains and improve general health status    Time 6   Period Weeks   Status New  Plan - 09/06/16 1151    Clinical Impression Statement Continued session focus on improving lumbar/ hip mobility and proximal musculature strengtheing in [place] and CKC exercises.  Pt able to demonstrate appropriate for with min cueing with therex for proper from and technique especially with scapular retraction and cueing for equal stance with squats and appropraite weight bearing.  Pt demonstrates improved awaremess with proper posture this session.  Reviewed bed mobiltiy as pt stated main problem currently is pain at night following strain to use restroom.  Pt able to demonstrate with min cueing for initial form/technique.  Pt does report overall back pian minimal does continue to have prostate problems, reports apt with MD scheduled in October.   Rehab Potential Good   PT Frequency 2x / week   PT Duration 6 weeks   PT Treatment/Interventions ADLs/Self Care Home Management;Cryotherapy;Moist Heat;Biofeedback;Gait training;Stair training;Functional mobility training;Therapeutic activities;Therapeutic exercise;Balance training;Neuromuscular re-education;Patient/family education;Manual techniques;Energy conservation;Taping   PT Next Visit Plan Continue work on flexibiilty/mobility of lumbar spine/hips, posture and core work. Continue to work on [employer] strength.    PT Home Exercise Plan 9/6: SKTC, lumbar rotations; 08/29/2016: piriformis stretch and 3D hip excursion      Patient will benefit from skilled therapeutic intervention in order to improve the following deficits and impairments:  Abnormal gait, Improper body mechanics, Pain, Decreased mobility, Postural dysfunction, Hypomobility, Decreased strength, Impaired flexibility  Visit Diagnosis: Midline low back pain, with sciatica presence unspecified  Abnormal posture  Other symptoms and signs involving the musculoskeletal system  Muscle weakness (generalized)     Problem List Patient Active Problem List   Diagnosis Date Noted  . Malignant  neoplasm of prostate ([place]) 11/17/2015  . Chronic kidney disease, stage 3, mod decreased GFR 12/27/2011  . Diabetes mellitus, type II ([place]) 12/27/2011  . Rheumatoid arthritis(714.0) 01/04/2011  . SINUS BRADYCARDIA 01/12/2010  . 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 HYPERLIPIDEMIA 01/08/2010  . Hypertension 01/08/2010   Ihor Austin, [place]; [place]  Aldona Lento 09/06/2016, 12:53 PM  [place] [address] [place], [place], [address] Phone: [phone]   Fax:  [phone]  Name: Jacob Farmer MRN: [id number] Date of Birth: 1946/11/29

## 2016-09-10 ENCOUNTER — Ambulatory Visit (HOSPITAL_COMMUNITY): Payer: Commercial Managed Care - HMO | Admitting: Physical Therapy

## 2016-09-10 DIAGNOSIS — M545 Low back pain: Secondary | ICD-10-CM

## 2016-09-10 DIAGNOSIS — R29898 Other symptoms and signs involving the musculoskeletal system: Secondary | ICD-10-CM | POA: Diagnosis not present

## 2016-09-10 DIAGNOSIS — M6281 Muscle weakness (generalized): Secondary | ICD-10-CM | POA: Diagnosis not present

## 2016-09-10 DIAGNOSIS — R293 Abnormal posture: Secondary | ICD-10-CM

## 2016-09-10 NOTE — Therapy (Signed)
[place], [place], [address] Phone: [phone]   Fax:  [phone]  Physical Therapy Treatment (Re-Assessment)  Patient Details  Name: Jacob Farmer MRN: [id number] Date of Birth: 27-Jun-1946 Referring Provider: Bo Merino  Encounter Date: 09/10/2016      PT End of Session - 09/10/16 1209    Visit Number 6   Number of Visits 12   Date for PT Re-Evaluation 10/01/16   Authorization Type [employer] Medicare (G-codes done 6th session)   Authorization Time Period 08/21/16 to 10/02/16   Authorization - Visit Number 6   Authorization - Number of Visits 16   PT Start Time 7408   PT Stop Time 1200   PT Time Calculation (min) 43 min   Activity Tolerance Patient tolerated treatment well   Behavior During Therapy [facility] for tasks assessed/performed      Past Medical History:  Diagnosis Date  . Arteriosclerotic cardiovascular disease (ASCVD) cardiologist-  dr Roderic Palau branch   Acute myocardial infarction treated with TPA in 1990; 1999-stents to circumflex and RCA; residual total occlusion of the left anterior descending; normal ejection fraction; stress nuclear in 2001-distal anteroseptal ischemia; normal LV function  . Chronic kidney disease, stage 3, mod decreased GFR nephrologist-  at [facility] in [place]   Creatinine-2.18 and 12/2007, 1.76 and 12/2008  . First degree heart block   . History of gastric ulcer   . History of MI (myocardial infarction)    1990-  treated w/ TPA  . Hyperlipidemia   . Hypertension   . OA (osteoarthritis)   . Prediabetes   . Prostate cancer [facility]) urologist-- dr dahlstedt/  oncologist- dr Tammi Klippel   Stage T1c , Gleason 3+4,  PSA 4.7,  vol 24cc--  scheduled for radiative seed implants  . Psoriatic arthritis ([place])   . RA (rheumatoid arthritis) ([place])    rheumologist-  dr Toni Amend  . S/P drug eluting coronary stent placement    1999--  DES x2 to CFX and RCA  . Sinus  bradycardia   . Wears dentures    upper and lower partial    Past Surgical History:  Procedure Laterality Date  . CARDIOVASCULAR STRESS TEST  2001  per Dr Lattie Haw clinic note   distal anteroseptal ischemia,  normal LVF  . COLONOSCOPY  last one 2006 (approx)  . CORONARY ANGIOPLASTY WITH STENT PLACEMENT  1999   DES x2  to CFX and RCA/  residual total occlusion LAD,  normal LVEF  . RADIOACTIVE SEED IMPLANT N/A 01/11/2016   Procedure: RADIOACTIVE SEED IMPLANT/BRACHYTHERAPY IMPLANT;  Surgeon: Franchot Gallo, MD;  Location: [facility];  Service: Urology;  Laterality: N/A;   73  seeds implanted no seeds founds in bladder    There were no vitals filed for this visit.      Subjective Assessment - 09/10/16 1119    Subjective Patient arrives stating he is feeling like PT is helping his muscles but his pain is still there at night; he reports his pain is like clockwork and comes when he is trying to sleep. He is going to see his cancer/prostate MD in mid-October, and he is going to try an [facility] patch on the area of pain on his hip. His pain just just enough to disturb his sleep. Sometimes he does get numbness or tingling for a couple of seconds on the lateral side of his shin, and his L LE does feel a little weaker than the R.  Pertinent History on beta-blockers, HTN, sinus bradycardia, DM, RA, CKD, history of prostate cancer with placement of radioactive seeds in January 2017, hx of MI with cardiac stent placement    How long can you sit comfortably? 9/26- sitting on a barstool can still make it feel better, otherwise he is not fairly unlimited    How long can you stand comfortably? 9/26- continues to relieve pain, unlimited    How long can you walk comfortably? 9/26- unlimited, continues to relieve pain    Diagnostic tests see images done in August 2017   Patient Stated Goals find out what is causing pain, get rid of pain    Currently in Pain? Yes   Pain Score 1    Pain  Location Buttocks   Pain Orientation Left   Pain Descriptors / Indicators Cramping   Pain Type Chronic pain   Pain Radiating Towards none, mostly in the butt    Pain Onset More than a month ago   Pain Frequency Intermittent   Aggravating Factors  mostly comes at night when he is trying to sleep (9pm-9am) but is not constant, does not keep him from sleeping    Pain Relieving Factors standing and walking, exercise    Effect of Pain on Daily Activities mostly just annoying, he can do everything he needs to do             [facility] PT Assessment - 09/10/16 0001      AROM   Lumbar Flexion fingertips to floor    Lumbar Extension [facility]    Lumbar - Right Side Bend [facility]    Lumbar - Left Side Bend [facility]      Strength   Right Hip Flexion 4+/5   Right Hip Extension 4/5   Right Hip ABduction 4+/5   Left Hip Flexion 4+/5   Left Hip Extension 4/5   Left Hip ABduction 4+/5   Right Knee Flexion 4+/5   Right Knee Extension 5/5   Left Knee Flexion 4+/5   Left Knee Extension 4/5   Right Ankle Dorsiflexion 5/5   Left Ankle Dorsiflexion 4+/5     Palpation   Palpation comment noted L PSIS appears more anterior than R      6 minute walk test results    Aerobic Endurance Distance Walked 706   Endurance additional comments 3MWT                              PT Education - 09/10/16 1208    Education provided Yes   Education Details progress with skilled PT services/POC, relation of lumbar imaging to true impairments/not necessarily identifying true cause of pain, possible SI inflammation that could be contributing to pain, SI belt/mechanics    Person(s) Educated Patient   Methods Explanation;Demonstration   Comprehension Verbalized understanding          PT Short Term Goals - 09/10/16 1154      PT SHORT TERM GOAL #1   Title Patient to be able to maintain correct posture at least 75% of the time in order to improve mechanics and reduce pain    Time 3   Period Weeks    Status Partially Met     PT SHORT TERM GOAL #2   Title Patient to demonstrate only mild limitation in bilateral hips and lumbar spine in order to assist in reducing pain and improving mechanics    Time 3   Period Weeks  Status Achieved     PT SHORT TERM GOAL #3   Title Patient to be able to sleep 4-5 hours before waking up due to pain in order to improve general QOL    Baseline 9/26- prostate limits sleep more than pain, he is not sure specifically    Time 3   Period Weeks   Status On-going     PT SHORT TERM GOAL #4   Title Patient to perform appropriate HEP correctly and consistently, to be updated PRN    Time 3   Period Weeks   Status Achieved           PT Long Term Goals - 09/10/16 1156      PT LONG TERM GOAL #1   Title Patient to demonstrate strength 5/5 in all tested groups in order to improve general mechanics and functional task performance    Time 6   Period Weeks   Status Partially Met     PT LONG TERM GOAL #2   Title Patient to report he has been able to sleep through the night without being woken due to pain in order to improve general QOL    Time 6   Period Weeks   Status On-going     PT LONG TERM GOAL #3   Title Patient to demonstrate correct functional lifting mechanics to assist in prevent re-occurrence or exacerbation of pain    Time 6   Period Weeks   Status On-going     PT LONG TERM GOAL #4   Title Patient to be pariticipatory in regular aerobic exercise program, at least 20 minutes in duration and 4 days per week, in order to maintain functional gains and improve general health status    Baseline 9/26- doing 3 days a week of regular walking    Time 6   Period Weeks   Status On-going               Plan - 09/10/16 1210    Clinical Impression Statement Re-assessment performed today. Patient arrive stating that while he feels somewhat better, he is frustrated because he is still having pain in his L hip area at night; walking and standing  continue to reduce his pain thus far. Upon examination, patient shows improvements in walking speed/tolerance/mechanics and functional strength; noted that L PSIS appears possibly rotated forwards today, and patient also seems to be describing some symptoms of possible SIJ pain. Trialed mock SI belt (gait belt and towel with max pressure on area) with some relief reported. Patient agreeable to trial of continuation with skilled PT services, states he is going to try an icy hot patch over his lateral hip/SI joint where the pain is bothering him (patient's pain so far seems mechanical and he reports his cancer/prostate was happy with his status at most recent checkup). Educated patient regarding possible SI pathology/offered SI belt order to MD, also not to put heat directly over area of prostate as a precaution. At this point recommend extension of skilled PT services to attempt to further address impairments and reach optimal level of function, further investigate possible SIJ involvement.    Rehab Potential Good   PT Frequency 2x / week   PT Duration 3 weeks   PT Treatment/Interventions ADLs/Self Care Home Management;Cryotherapy;Moist Heat;Biofeedback;Gait training;Stair training;Functional mobility training;Therapeutic activities;Therapeutic exercise;Balance training;Neuromuscular re-education;Patient/family education;Manual techniques;Energy conservation;Taping   PT Next Visit Plan further investigate SI area, check for LLD; functional strength and posture. Update HEP.    PT Home Exercise  Plan 9/6: [place], lumbar rotations; 08/29/2016: piriformis stretch and 3D hip excursion   Consulted and Agree with Plan of Care Patient      Patient will benefit from skilled therapeutic intervention in order to improve the following deficits and impairments:  Abnormal gait, Improper body mechanics, Pain, Decreased mobility, Postural dysfunction, Hypomobility, Decreased strength, Impaired flexibility  Visit  Diagnosis: Midline low back pain, with sciatica presence unspecified  Abnormal posture  Other symptoms and signs involving the musculoskeletal system  Muscle weakness (generalized)       G-Codes - 2016-09-12 1216    Functional Assessment Tool Used Based on skilled clinical asessment of strength, posture, gait, ROM, flexibility, pain patterns    Functional Limitation Mobility: Walking and moving around   Mobility: Walking and Moving Around Current Status [phone]) At least 1 percent but less than 20 percent impaired, limited or restricted   Mobility: Walking and Moving Around Goal Status [phone]) 0 percent impaired, limited or restricted      Problem List Patient Active Problem List   Diagnosis Date Noted  . Malignant neoplasm of prostate ([place]) 11/17/2015  . Chronic kidney disease, stage 3, mod decreased GFR 12/27/2011  . Diabetes mellitus, type II ([place]) 12/27/2011  . Rheumatoid arthritis(714.0) 01/04/2011  . SINUS BRADYCARDIA 01/12/2010  . 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 HYPERLIPIDEMIA 01/08/2010  . Hypertension 01/08/2010    Deniece Ree PT, DPT [phone]  [place] [address] [place], [place], [address] Phone: [phone]   Fax:  [phone]  Name: Jacob Farmer MRN: [id number] Date of Birth: 02-Aug-1946

## 2016-09-12 ENCOUNTER — Ambulatory Visit (HOSPITAL_COMMUNITY): Payer: Commercial Managed Care - HMO | Admitting: Physical Therapy

## 2016-09-12 DIAGNOSIS — R293 Abnormal posture: Secondary | ICD-10-CM

## 2016-09-12 DIAGNOSIS — M6281 Muscle weakness (generalized): Secondary | ICD-10-CM | POA: Diagnosis not present

## 2016-09-12 DIAGNOSIS — M545 Low back pain: Secondary | ICD-10-CM

## 2016-09-12 DIAGNOSIS — R29898 Other symptoms and signs involving the musculoskeletal system: Secondary | ICD-10-CM | POA: Diagnosis not present

## 2016-09-12 NOTE — Therapy (Signed)
[place], [place], [address] Phone: [phone]   Fax:  [phone]  Physical Therapy Treatment  Patient Details  Name: Jacob Farmer MRN: [id number] Date of Birth: 08-26-1946 Referring Provider: Bo Merino  Encounter Date: 09/12/2016      PT End of Session - 09/12/16 1433    Visit Number 7   Number of Visits 12   Date for PT Re-Evaluation 10/01/16   Authorization Type [employer] (G-codes done 6th session)   Authorization Time Period 08/21/16 to 10/02/16   Authorization - Visit Number 7   Authorization - Number of Visits 16   PT Start Time 1350   PT Stop Time 1430   PT Time Calculation (min) 40 min   Activity Tolerance Patient tolerated treatment well   Behavior During Therapy [facility] for tasks assessed/performed      Past Medical History:  Diagnosis Date  . Arteriosclerotic cardiovascular disease (ASCVD) cardiologist-  dr Roderic Palau branch   Acute myocardial infarction treated with TPA in 1990; 1999-stents to circumflex and RCA; residual total occlusion of the left anterior descending; normal ejection fraction; stress nuclear in 2001-distal anteroseptal ischemia; normal LV function  . Chronic kidney disease, stage 3, mod decreased GFR nephrologist-  at [facility] in [place]   Creatinine-2.18 and 12/2007, 1.76 and 12/2008  . First degree heart block   . History of gastric ulcer   . History of MI (myocardial infarction)    1990-  treated w/ TPA  . Hyperlipidemia   . Hypertension   . OA (osteoarthritis)   . Prediabetes   . Prostate cancer [facility]) urologist-- dr dahlstedt/  oncologist- dr Tammi Klippel   Stage T1c , Gleason 3+4,  PSA 4.7,  vol 24cc--  scheduled for radiative seed implants  . Psoriatic arthritis ([place])   . RA (rheumatoid arthritis) ([place])    rheumologist-  dr Toni Amend  . S/P drug eluting coronary stent placement    1999--  DES x2 to CFX and RCA  . Sinus bradycardia   . Wears  dentures    upper and lower partial    Past Surgical History:  Procedure Laterality Date  . CARDIOVASCULAR STRESS TEST  2001  per Dr Lattie Haw clinic note   distal anteroseptal ischemia,  normal LVF  . COLONOSCOPY  last one 2006 (approx)  . CORONARY ANGIOPLASTY WITH STENT PLACEMENT  1999   DES x2  to CFX and RCA/  residual total occlusion LAD,  normal LVEF  . RADIOACTIVE SEED IMPLANT N/A 01/11/2016   Procedure: RADIOACTIVE SEED IMPLANT/BRACHYTHERAPY IMPLANT;  Surgeon: Franchot Gallo, MD;  Location: [facility];  Service: Urology;  Laterality: N/A;   73  seeds implanted no seeds founds in bladder    There were no vitals filed for this visit.      Subjective Assessment - 09/12/16 1400    Subjective Pt states he has appt with his arthritis MD tomorrow morning.  States he is still having more discomfort at night.  currently without pain.   Currently in Pain? No/denies            [facility] PT Assessment - 09/12/16 0001      Special Tests    Special Tests --  LL measurement 35 inches bilaterally                      [facility] Adult PT Treatment/Exercise - 09/12/16 0001      Lumbar Exercises: Stretches  Active Hamstring Stretch 3 reps;30 seconds   Active Hamstring Stretch Limitations supine with rope   Piriformis Stretch 2 reps;30 seconds   Piriformis Stretch Limitations seated      Lumbar Exercises: Supine   Bridge 10 reps   Bridge Limitations before MET     Lumbar Exercises: Sidelying   Clam 10 reps   Hip Abduction 15 reps     Lumbar Exercises: Prone   Straight Leg Raise 15 reps     Manual Therapy   Manual Therapy Muscle Energy Technique   Manual therapy comments completed at beginning of session separate from other skilled activity   Muscle Energy Technique to correct Lt anterior rotation in supine                   PT Short Term Goals - 09/10/16 1154      PT SHORT TERM GOAL #1   Title Patient to be able to maintain correct  posture at least 75% of the time in order to improve mechanics and reduce pain    Time 3   Period Weeks   Status Partially Met     PT SHORT TERM GOAL #2   Title Patient to demonstrate only mild limitation in bilateral hips and lumbar spine in order to assist in reducing pain and improving mechanics    Time 3   Period Weeks   Status Achieved     PT SHORT TERM GOAL #3   Title Patient to be able to sleep 4-5 hours before waking up due to pain in order to improve general QOL    Baseline 9/26- prostate limits sleep more than pain, he is not sure specifically    Time 3   Period Weeks   Status On-going     PT SHORT TERM GOAL #4   Title Patient to perform appropriate HEP correctly and consistently, to be updated PRN    Time 3   Period Weeks   Status Achieved           PT Long Term Goals - 09/10/16 1156      PT LONG TERM GOAL #1   Title Patient to demonstrate strength 5/5 in all tested groups in order to improve general mechanics and functional task performance    Time 6   Period Weeks   Status Partially Met     PT LONG TERM GOAL #2   Title Patient to report he has been able to sleep through the night without being woken due to pain in order to improve general QOL    Time 6   Period Weeks   Status On-going     PT LONG TERM GOAL #3   Title Patient to demonstrate correct functional lifting mechanics to assist in prevent re-occurrence or exacerbation of pain    Time 6   Period Weeks   Status On-going     PT LONG TERM GOAL #4   Title Patient to be pariticipatory in regular aerobic exercise program, at least 20 minutes in duration and 4 days per week, in order to maintain functional gains and improve general health status    Baseline 9/26- doing 3 days a week of regular walking    Time 6   Period Weeks   Status On-going               Plan - 09/12/16 1434    Clinical Impression Statement SI checked for dysfunction with noted anterior rotation of Lt SI.  Completed  muscle  energy technique prior to therex with good results.  Also checked LL length with findings of equality at 35 inches.  continued with strengthening therex for LE with noted weakness of Lt LE.  Added sidelying clams with holds to further strengthen glute and hip abductors.  Pt reported overall improvement at end of session without return of pain.  Pt will determine further need of appointments after visiting arthritis MD tomorrow.     Rehab Potential Good   PT Frequency 2x / week   PT Duration 3 weeks   PT Treatment/Interventions ADLs/Self Care Home Management;Cryotherapy;Moist Heat;Biofeedback;Gait training;Stair training;Functional mobility training;Therapeutic activities;Therapeutic exercise;Balance training;Neuromuscular re-education;Patient/family education;Manual techniques;Energy conservation;Taping   PT Next Visit Plan continue with SI checks and muscle energy techniques when needed.  Progress functional strength and posture. Update HEP as needed.    PT Home Exercise Plan 9/6: SKTC, lumbar rotations; 08/29/2016: piriformis stretch and 3D hip excursion   Consulted and Agree with Plan of Care Patient      Patient will benefit from skilled therapeutic intervention in order to improve the following deficits and impairments:  Abnormal gait, Improper body mechanics, Pain, Decreased mobility, Postural dysfunction, Hypomobility, Decreased strength, Impaired flexibility  Visit Diagnosis: Midline low back pain, with sciatica presence unspecified  Abnormal posture  Other symptoms and signs involving the musculoskeletal system  Muscle weakness (generalized)     Problem List Patient Active Problem List   Diagnosis Date Noted  . Malignant neoplasm of prostate ([place]) 11/17/2015  . Chronic kidney disease, stage 3, mod decreased GFR 12/27/2011  . Diabetes mellitus, type II ([place]) 12/27/2011  . Rheumatoid arthritis(714.0) 01/04/2011  . SINUS BRADYCARDIA 01/12/2010  . Arteriosclerotic  cardiovascular disease (ASCVD) 01/12/2010  . HYPERLIPIDEMIA 01/08/2010  . Hypertension 01/08/2010    Teena Irani, PTA/CLT [phone]  09/12/2016, 2:37 PM  [place] [address] [place], [place], [address] Phone: [phone]   Fax:  [phone]  Name: JORDANY RUSSETT MRN: [id number] Date of Birth: Aug 11, 1946

## 2016-09-13 DIAGNOSIS — M545 Low back pain: Secondary | ICD-10-CM | POA: Diagnosis not present

## 2016-09-13 DIAGNOSIS — M25562 Pain in left knee: Secondary | ICD-10-CM | POA: Diagnosis not present

## 2016-09-13 DIAGNOSIS — M19041 Primary osteoarthritis, right hand: Secondary | ICD-10-CM | POA: Diagnosis not present

## 2016-09-13 DIAGNOSIS — M0579 Rheumatoid arthritis with rheumatoid factor of multiple sites without organ or systems involvement: Secondary | ICD-10-CM | POA: Diagnosis not present

## 2016-09-13 DIAGNOSIS — Z09 Encounter for follow-up examination after completed treatment for conditions other than malignant neoplasm: Secondary | ICD-10-CM | POA: Diagnosis not present

## 2016-09-13 DIAGNOSIS — M533 Sacrococcygeal disorders, not elsewhere classified: Secondary | ICD-10-CM | POA: Diagnosis not present

## 2016-09-17 ENCOUNTER — Ambulatory Visit (HOSPITAL_COMMUNITY): Payer: Commercial Managed Care - HMO | Attending: Rheumatology

## 2016-09-17 ENCOUNTER — Telehealth (HOSPITAL_COMMUNITY): Payer: Self-pay

## 2016-09-17 NOTE — Telephone Encounter (Signed)
No show, tried to call but no answer or answering machine to leave message.    [address] [place]; [facility] [phone]

## 2016-09-18 ENCOUNTER — Telehealth (HOSPITAL_COMMUNITY): Payer: Self-pay

## 2016-09-18 DIAGNOSIS — R809 Proteinuria, unspecified: Secondary | ICD-10-CM | POA: Diagnosis not present

## 2016-09-18 DIAGNOSIS — N183 Chronic kidney disease, stage 3 (moderate): Secondary | ICD-10-CM | POA: Diagnosis not present

## 2016-09-18 DIAGNOSIS — D509 Iron deficiency anemia, unspecified: Secondary | ICD-10-CM | POA: Diagnosis not present

## 2016-09-18 DIAGNOSIS — E559 Vitamin D deficiency, unspecified: Secondary | ICD-10-CM | POA: Diagnosis not present

## 2016-09-18 DIAGNOSIS — I10 Essential (primary) hypertension: Secondary | ICD-10-CM | POA: Diagnosis not present

## 2016-09-18 DIAGNOSIS — Z79899 Other long term (current) drug therapy: Secondary | ICD-10-CM | POA: Diagnosis not present

## 2016-09-18 NOTE — Telephone Encounter (Signed)
09/18/16 wife called to say that he told his therapist last week that he wouldn't be coming back to therapy

## 2016-09-19 ENCOUNTER — Ambulatory Visit (HOSPITAL_COMMUNITY): Payer: Commercial Managed Care - HMO | Admitting: Physical Therapy

## 2016-09-19 DIAGNOSIS — E1129 Type 2 diabetes mellitus with other diabetic kidney complication: Secondary | ICD-10-CM | POA: Diagnosis not present

## 2016-09-19 DIAGNOSIS — E782 Mixed hyperlipidemia: Secondary | ICD-10-CM | POA: Diagnosis not present

## 2016-09-19 DIAGNOSIS — I10 Essential (primary) hypertension: Secondary | ICD-10-CM | POA: Diagnosis not present

## 2016-09-19 DIAGNOSIS — Z23 Encounter for immunization: Secondary | ICD-10-CM | POA: Diagnosis not present

## 2016-09-19 DIAGNOSIS — Z6828 Body mass index (BMI) 28.0-28.9, adult: Secondary | ICD-10-CM | POA: Diagnosis not present

## 2016-09-24 ENCOUNTER — Encounter (HOSPITAL_COMMUNITY): Payer: Commercial Managed Care - HMO

## 2016-09-25 DIAGNOSIS — N2581 Secondary hyperparathyroidism of renal origin: Secondary | ICD-10-CM | POA: Diagnosis not present

## 2016-09-25 DIAGNOSIS — N183 Chronic kidney disease, stage 3 (moderate): Secondary | ICD-10-CM | POA: Diagnosis not present

## 2016-09-25 DIAGNOSIS — C61 Malignant neoplasm of prostate: Secondary | ICD-10-CM | POA: Diagnosis not present

## 2016-09-25 DIAGNOSIS — Z79899 Other long term (current) drug therapy: Secondary | ICD-10-CM | POA: Diagnosis not present

## 2016-09-25 DIAGNOSIS — I10 Essential (primary) hypertension: Secondary | ICD-10-CM | POA: Diagnosis not present

## 2016-09-26 ENCOUNTER — Encounter (HOSPITAL_COMMUNITY): Payer: Commercial Managed Care - HMO | Admitting: Physical Therapy

## 2016-10-01 ENCOUNTER — Encounter (HOSPITAL_COMMUNITY): Payer: Commercial Managed Care - HMO | Admitting: Physical Therapy

## 2016-10-01 ENCOUNTER — Ambulatory Visit (INDEPENDENT_AMBULATORY_CARE_PROVIDER_SITE_OTHER): Payer: Commercial Managed Care - HMO | Admitting: Urology

## 2016-10-01 DIAGNOSIS — R300 Dysuria: Secondary | ICD-10-CM | POA: Diagnosis not present

## 2016-10-01 DIAGNOSIS — N320 Bladder-neck obstruction: Secondary | ICD-10-CM | POA: Diagnosis not present

## 2016-10-01 DIAGNOSIS — N529 Male erectile dysfunction, unspecified: Secondary | ICD-10-CM | POA: Diagnosis not present

## 2016-10-01 DIAGNOSIS — Z8546 Personal history of malignant neoplasm of prostate: Secondary | ICD-10-CM

## 2016-10-03 ENCOUNTER — Ambulatory Visit (INDEPENDENT_AMBULATORY_CARE_PROVIDER_SITE_OTHER): Payer: Self-pay | Admitting: Specialist

## 2016-10-03 ENCOUNTER — Encounter (HOSPITAL_COMMUNITY): Payer: Commercial Managed Care - HMO | Admitting: Physical Therapy

## 2016-10-03 ENCOUNTER — Ambulatory Visit (INDEPENDENT_AMBULATORY_CARE_PROVIDER_SITE_OTHER): Payer: Commercial Managed Care - HMO | Admitting: Specialist

## 2016-10-03 DIAGNOSIS — M545 Low back pain: Secondary | ICD-10-CM

## 2016-10-09 ENCOUNTER — Other Ambulatory Visit (INDEPENDENT_AMBULATORY_CARE_PROVIDER_SITE_OTHER): Payer: Self-pay | Admitting: Surgery

## 2016-10-09 DIAGNOSIS — M545 Low back pain, unspecified: Secondary | ICD-10-CM

## 2016-10-17 ENCOUNTER — Ambulatory Visit (HOSPITAL_COMMUNITY)
Admission: RE | Admit: 2016-10-17 | Discharge: 2016-10-17 | Disposition: A | Discharge status: Home / Self Care | Payer: Commercial Managed Care - HMO | Source: Ambulatory Visit | Attending: Surgery | Admitting: Surgery

## 2016-10-17 ENCOUNTER — Ambulatory Visit (HOSPITAL_COMMUNITY): Payer: Commercial Managed Care - HMO

## 2016-10-17 DIAGNOSIS — H5200 Hypermetropia, unspecified eye: Secondary | ICD-10-CM | POA: Diagnosis not present

## 2016-10-17 DIAGNOSIS — M545 Low back pain, unspecified: Secondary | ICD-10-CM

## 2016-10-17 DIAGNOSIS — E119 Type 2 diabetes mellitus without complications: Secondary | ICD-10-CM | POA: Diagnosis not present

## 2016-10-17 LAB — HM DIABETES EYE EXAM

## 2016-10-31 ENCOUNTER — Encounter (INDEPENDENT_AMBULATORY_CARE_PROVIDER_SITE_OTHER): Payer: Self-pay | Admitting: Specialist

## 2016-10-31 ENCOUNTER — Ambulatory Visit (INDEPENDENT_AMBULATORY_CARE_PROVIDER_SITE_OTHER): Payer: Commercial Managed Care - HMO | Admitting: Specialist

## 2016-10-31 VITALS — BP 137/79 | HR 68 | Ht 70.0 in | Wt 198.0 lb

## 2016-10-31 DIAGNOSIS — M48062 Spinal stenosis, lumbar region with neurogenic claudication: Secondary | ICD-10-CM | POA: Diagnosis not present

## 2016-10-31 DIAGNOSIS — M5416 Radiculopathy, lumbar region: Secondary | ICD-10-CM

## 2016-10-31 MED ORDER — PREDNISONE 5 MG PO TABS: 5.0000 mg | ORAL_TABLET | Freq: Every day | ORAL | 0 refills | Status: DC

## 2016-10-31 MED ORDER — HYDROCODONE-ACETAMINOPHEN 5-325 MG PO TABS: 1.0000 | ORAL_TABLET | Freq: Four times a day (QID) | ORAL | 0 refills | Status: DC | PRN

## 2016-10-31 MED ORDER — GABAPENTIN 100 MG PO CAPS
100.0000 mg | ORAL_CAPSULE | Freq: Every day | ORAL | 3 refills | Status: DC
Start: 2016-10-31 — End: 2016-11-22

## 2016-10-31 NOTE — Patient Instructions (Signed)
Avoid bending, stooping and avoid lifting weights greater than 10 lbs. Avoid prolong standing and walking. Avoid frequent bending and stooping  No lifting greater than 10 lbs. May use ice or moist heat for pain. Weight loss is of benefit. Handicap license is approved. Dr. Romona Curls secretary/Assistant will call to arrange for EMGs

## 2016-10-31 NOTE — Progress Notes (Signed)
Office Visit Note   Patient: Jacob Farmer           Date of Birth: 27-Nov-1946           MRN: [id number] Visit Date: 10/31/2016              Requested by: Redmond School, MD [address] [place], [place] [address] PCP: Glo Herring., MD   Assessment & Plan: Visit Diagnoses:  1. Subacute left lumbar radiculopathy   2. Spinal stenosis of lumbar region with neurogenic claudication     Plan: Avoid bending, stooping and avoid lifting weights greater than 10 lbs. Avoid prolong standing and walking. Avoid frequent bending and stooping  No lifting greater than 10 lbs. May use ice or moist heat for pain. Weight loss is of benefit. Handicap license is approved. Dr. Romona Curls secretary/Assistant will call to arrange for EMGs Follow-Up Instructions: Return in about 3 weeks (around 11/21/2016) for left leg EMG findings..   Orders:  Orders Placed This Encounter  Procedures  . Ambulatory referral to Physical Medicine Rehab   Meds ordered this encounter  Medications  . gabapentin (NEURONTIN) 100 MG capsule    Sig: Take 1 capsule (100 mg total) by mouth at bedtime.    Dispense:  30 capsule    Refill:  3  . predniSONE (DELTASONE) 5 MG tablet    Sig: Take 1 tablet (5 mg total) by mouth daily with breakfast. One tablet per day for 2 weeks then 1/2 tablet per day for 2 weeks.    Dispense:  21 tablet    Refill:  0  . HYDROcodone-acetaminophen (NORCO/VICODIN) 5-325 MG tablet    Sig: Take 1 tablet by mouth every 6 (six) hours as needed for moderate pain.    Dispense:  30 tablet    Refill:  0      Procedures: No procedures performed   Clinical Data: Findings:  MRI shows mild changes in the left L4-5 subarticular stenosis, severe left L5 foramenal stenosis. No definite left L3 or L4 nerve compressionl    Subjective: Chief Complaint  Patient presents with  . Lower Back - Pain    Patient coming in today to review MRI lumbar. Patient states pain is becoming more  constant. Pain radiating into left leg. Left leg feels weak. Trouble sleeping.  Up till 3 AM this AM due to left knee pain and pain along the front of the left shin. Worse with standing and walking. No bowel or bladder difficulty. No strength in the left leg and  Knee. Pain improves with sitting. Lying in bed gets up and sitting to decrease Pain. Been present since 06/2016    Review of Systems  HENT: Positive for ear discharge.   Eyes: Positive for visual disturbance.  Respiratory: Negative for apnea, cough, choking, chest tightness, shortness of breath, wheezing and stridor.   Cardiovascular: Negative.   Gastrointestinal: Negative.   Endocrine: Negative.   Genitourinary: Positive for dysuria and frequency. Negative for decreased urine volume, difficulty urinating, enuresis, flank pain, hematuria and urgency.  Musculoskeletal: Positive for arthralgias, back pain and gait problem.  Skin: Negative.   Neurological: Positive for syncope, weakness and numbness.  Hematological: Negative.   Psychiatric/Behavioral: Negative.      Objective: Vital Signs: BP 137/79   Pulse 68   Ht 5\' 10"  (1.778 m)   Wt 198 lb (89.8 kg)   BMI 28.41 kg/m   Physical Exam  Constitutional: He is oriented to person, place, and time. He appears well-developed  and well-nourished.  HENT:  Head: Normocephalic and atraumatic.  Eyes: EOM are normal. Pupils are equal, round, and reactive to light.  Neck: Normal range of motion. Neck supple.  Pulmonary/Chest: Effort normal and breath sounds normal.  Abdominal: Soft. Bowel sounds are normal.  Musculoskeletal: Normal range of motion. He exhibits edema.  Neurological: He is alert and oriented to person, place, and time. He displays normal reflexes. No cranial nerve deficit. Coordination normal.  Skin: Skin is warm and dry.  Psychiatric: He has a normal mood and affect. His behavior is normal. Judgment and thought content normal.    Back Exam   Muscle Strength    Right Quadriceps:  5/5  Left Quadriceps:  2/5  Right Hamstrings:  5/5  Left Hamstrings:  5/5   Reflexes  Patellar: Hyporeflexic Achilles: Hyporeflexic  Other  Toe Walk: normal Heel Walk: normal Gait: antalgic  Erythema: no back redness Scars: absent      Specialty Comments:  No specialty comments available.  Imaging: No results found.   PMFS History: Patient Active Problem List   Diagnosis Date Noted  . Malignant neoplasm of prostate ([place]) 11/17/2015  . Chronic kidney disease, stage 3, mod decreased GFR 12/27/2011  . Diabetes mellitus, type II ([place]) 12/27/2011  . Rheumatoid arthritis(714.0) 01/04/2011  . SINUS BRADYCARDIA 01/12/2010  . 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 HYPERLIPIDEMIA 01/08/2010  . Hypertension 01/08/2010   Past Medical History:  Diagnosis Date  . Arteriosclerotic cardiovascular disease (ASCVD) cardiologist-  dr Roderic Palau branch   Acute myocardial infarction treated with TPA in 1990; 1999-stents to circumflex and RCA; residual total occlusion of the left anterior descending; normal ejection fraction; stress nuclear in 2001-distal anteroseptal ischemia; normal LV function  . Chronic kidney disease, stage 3, mod decreased GFR nephrologist-  at [facility] in [place]   Creatinine-2.18 and 12/2007, 1.76 and 12/2008  . First degree heart block   . History of gastric ulcer   . History of MI (myocardial infarction)    1990-  treated w/ TPA  . Hyperlipidemia   . Hypertension   . OA (osteoarthritis)   . Prediabetes   . Prostate cancer [facility]) urologist-- dr dahlstedt/  oncologist- dr Tammi Klippel   Stage T1c , Gleason 3+4,  PSA 4.7,  vol 24cc--  scheduled for radiative seed implants  . Psoriatic arthritis ([place])   . RA (rheumatoid arthritis) ([place])    rheumologist-  dr Toni Amend  . S/P drug eluting coronary stent placement    1999--  DES x2 to CFX and RCA  . Sinus bradycardia   . Wears dentures    upper and lower partial     Family History  Problem Relation Age of Onset  . Heart attack Mother   . Coronary artery disease Father     Past Surgical History:  Procedure Laterality Date  . CARDIOVASCULAR STRESS TEST  2001  per Dr Lattie Haw clinic note   distal anteroseptal ischemia,  normal LVF  . COLONOSCOPY  last one 2006 (approx)  . CORONARY ANGIOPLASTY WITH STENT PLACEMENT  1999   DES x2  to CFX and RCA/  residual total occlusion LAD,  normal LVEF  . RADIOACTIVE SEED IMPLANT N/A 01/11/2016   Procedure: RADIOACTIVE SEED IMPLANT/BRACHYTHERAPY IMPLANT;  Surgeon: Franchot Gallo, MD;  Location: [facility];  Service: Urology;  Laterality: N/A;   73  seeds implanted no seeds founds in bladder   Social History   Occupational History  . Retired Clinical biochemist    Social History Main  Topics  . Smoking status: Former Smoker    Packs/day: 1.00    Years: 30.00    Types: Cigarettes    Quit date: 05/21/1989  . Smokeless tobacco: Never Used  . Alcohol use 21.0 oz/week    14 Shots of liquor, 21 Standard drinks or equivalent per week     Comment: 2-3 oz per day  . Drug use: No  . Sexual activity: Not on file

## 2016-11-04 ENCOUNTER — Telehealth (INDEPENDENT_AMBULATORY_CARE_PROVIDER_SITE_OTHER): Payer: Self-pay | Admitting: Orthopedic Surgery

## 2016-11-04 ENCOUNTER — Telehealth (INDEPENDENT_AMBULATORY_CARE_PROVIDER_SITE_OTHER): Payer: Self-pay | Admitting: Specialist

## 2016-11-04 NOTE — Telephone Encounter (Signed)
Jacob Farmer states that they are waiting on an appt for Nerve Conduction Studies with Dr. Ernestina Patches. Just calling to check on the status of getting this scheduled.

## 2016-11-04 NOTE — Telephone Encounter (Signed)
I went to his chart and looked at referrals and looks like it came in on 11/17 and I answred back and then Amy B tried to call but did not get answer, I hope I routed back to both you and her.

## 2016-11-04 NOTE — Telephone Encounter (Signed)
Scheduled pt for 11/22/16 @ 10:30

## 2016-11-05 NOTE — Telephone Encounter (Signed)
Pt has already been scheduled for 11/22/16

## 2016-11-20 ENCOUNTER — Other Ambulatory Visit: Payer: Self-pay | Admitting: Rheumatology

## 2016-11-21 NOTE — Telephone Encounter (Signed)
ok 

## 2016-11-21 NOTE — Telephone Encounter (Signed)
Last Visit: 09/13/16 Next Visit: 02/05/17 Labs: 09/26/16 C/W previous labs Patient reminded he is due for labs this month.  Okay to refill Arava?

## 2016-11-22 ENCOUNTER — Ambulatory Visit (INDEPENDENT_AMBULATORY_CARE_PROVIDER_SITE_OTHER): Payer: Commercial Managed Care - HMO | Admitting: Physical Medicine and Rehabilitation

## 2016-11-22 ENCOUNTER — Encounter (INDEPENDENT_AMBULATORY_CARE_PROVIDER_SITE_OTHER): Payer: Self-pay | Admitting: Physical Medicine and Rehabilitation

## 2016-11-22 DIAGNOSIS — M48062 Spinal stenosis, lumbar region with neurogenic claudication: Secondary | ICD-10-CM | POA: Diagnosis not present

## 2016-11-22 DIAGNOSIS — M62838 Other muscle spasm: Secondary | ICD-10-CM

## 2016-11-22 DIAGNOSIS — R202 Paresthesia of skin: Secondary | ICD-10-CM | POA: Diagnosis not present

## 2016-11-22 MED ORDER — GABAPENTIN 300 MG PO CAPS: 300.0000 mg | ORAL_CAPSULE | Freq: Every day | ORAL | 2 refills | Status: DC

## 2016-11-22 MED ORDER — BACLOFEN 10 MG PO TABS: 10.0000 mg | ORAL_TABLET | Freq: Three times a day (TID) | ORAL | 0 refills | Status: DC | PRN

## 2016-11-22 NOTE — Progress Notes (Signed)
Jacob Farmer - 70 y.o. male MRN [id number]  Date of birth: [birth year]  Office Visit Note: Visit Date: 11/22/2016 PCP: Glo Herring., MD Referred by: Redmond School, MD  Subjective: Chief Complaint  Patient presents with  . Left Leg - Pain, Weakness   HPI: Jacob Farmer is a 70 year old gentleman accompanied by his wife who provided some of the history. He reported great deal of lower back pain back in August of this year and then began to have a lot of weakness in September .  He reports mainly left leg weakness. Has fallen a couple of times. Says it feels like muscle in his legs are tightening up and walking makes it better. Has to get up several times a night to walk around to try and get relief. Denies numbness. He does have tingling and burning occassionally. He has been followed closely by Dr. Louanne Skye. Lumbar spine MRI as detailed below and is really nonfocal. Prior to the weakness and pain he did not have any other symptoms of these aware of such as headache or viral prodrome. He denies right-sided complaints of pain or weakness. He reports the weakness is mainly in the thigh.    Review of Systems  Constitutional: Negative for chills, fever, malaise/fatigue and weight loss.  HENT: Negative for hearing loss and sinus pain.   Eyes: Negative for blurred vision, double vision and photophobia.  Respiratory: Negative for cough and shortness of breath.   Cardiovascular: Negative for chest pain, palpitations and leg swelling.  Gastrointestinal: Negative for abdominal pain, nausea and vomiting.  Genitourinary: Negative for flank pain.  Musculoskeletal: Positive for back pain, falls and joint pain. Negative for myalgias.  Skin: Negative for itching and rash.  Neurological: Positive for sensory change. Negative for tremors, focal weakness and weakness.  Endo/Heme/Allergies: Negative.   Psychiatric/Behavioral: Negative for depression.  All other systems reviewed and are negative.  Otherwise per HPI.  Assessment & Plan: Visit Diagnoses:  1. Paresthesia of skin   2. Spinal stenosis of lumbar region with neurogenic claudication   3. Spasm of muscle     Plan: Findings:  ABNORMAL electrodiagnostic study as described more in detail below. This study is very hard to interpret because he does have what could be age-related 70-related atrophy of the intrinsic foot musculature and EDB muscles bilaterally. Those muscles are utilized to effectively test the fibular nerves. The needle EMG test was most consistent with a severe chronic L4 radiculopathy. Interestingly, there is nothing on the lumbar spine MRI that would go along with this. He could have a case of a neurogenic amyotrophy as he has been told he is borderline diabetic. He does not have any characteristics of an amyotrophic lateral sclerosis. I would suggest further evaluation by a neurologist.    Meds & Orders:  Meds ordered this encounter  Medications  . gabapentin (NEURONTIN) 300 MG capsule    Sig: Take 1 capsule (300 mg total) by mouth at bedtime.    Dispense:  30 capsule    Refill:  2  . baclofen (LIORESAL) 10 MG tablet    Sig: Take 1 tablet (10 mg total) by mouth every 8 (eight) hours as needed for muscle spasms (Pain).    Dispense:  60 tablet    Refill:  0    Orders Placed This Encounter  Procedures  . NCV with EMG (electromyography)    Follow-up: Return for scheduled follow up with Dr. Louanne Skye.   Procedures: No procedures performed  EMG & NCV Findings: Evaluation  of the left fibular motor nerve showed reduced amplitude (0.1 mV), decreased conduction velocity (B Fib-Ankle, 32 m/s), and decreased conduction velocity (Poplt-B Fib, 25 m/s).  The left tibial motor nerve showed prolonged distal onset latency (11.5 ms) and reduced amplitude (0.2 mV).  The left superficial fibular sensory nerve showed no response (14 cm).  The left sural sensory nerve showed no response (Calf).  All remaining nerves (as indicated in the  following tables) were within normal limits.    Needle evaluation of the left anterior tibialis muscle showed increased insertional activity, moderately increased spontaneous activity, and diminished recruitment.  The left Fibularis Longus and the left vastus medialis muscles showed increased insertional activity and diminished recruitment.  All remaining muscles (as indicated in the following table) showed no evidence of electrical instability.    Impression: The above electrodiagnostic study is ABNORMAL and reveals evidence of severe chronic L4 radiculopathy on the left.  However, this test is very hard to interpret because of bilateral intrinsic foot muscle atrophy. This could be age-related or related to a peripheral polyneuropathy. There are difficulties finding sensory nerve action potentials and he may indeed have a polyneuropathy. He is been told in the past that he had borderline diabetes. This weakness on the left with knee flexion and quadriceps weakness could be a result of a neurogenic amyotrophy. This condition is more common in older males with diabetes. There is no electrodiagnostic evidence or clinical evidence of ALS or motor neuron disease.   Recommendations: 1.  Follow-up with referring physician. 2.  Continue current management of symptoms. Suggest consultation with neurology.     Nerve Conduction Studies Anti Sensory Summary Table   Stim Site NR Peak (ms) Norm Peak (ms) P-T Amp (V) Norm P-T Amp Site1 Site2 Delta-P (ms) Dist (cm) Vel (m/s) Norm Vel (m/s)  Left Saphenous Anti Sensory (Ant Med Mall)  30.5C  14cm    3.8 <4.4 6.3 >2 14cm Ant Med Mall 3.8 0.0  >32  Left Sup Fibular Anti Sensory (Ant Lat Mall)  29.8C  14 cm *NR  <4.4  >5.0 14 cm Ant Lat Mall  14.0  >32  Left Sural Anti Sensory (Lat Mall)  30.1C  Calf *NR  <4.0  >5.0 Calf Lat Mall  14.0  >35   Motor Summary Table   Stim Site NR Onset (ms) Norm Onset (ms) O-P Amp (mV) Norm O-P Amp Site1 Site2 Delta-0 (ms)  Dist (cm) Vel (m/s) Norm Vel (m/s)  Left Fibular Motor (Ext Dig Brev)  28.6C  Ankle    3.9 <6.1 *0.1 >2.5 B Fib Ankle 9.9 31.5 *32 >38  B Fib    13.8  0.1  Poplt B Fib 4.0 10.0 *25 >40  Poplt    17.8  0.2         Left Tibial Motor (Abd Hall Brev)  29.3C  Ankle    *11.5 <6.1 *0.2 >3.0 Knee Ankle 10.5 37.0 35 >35  Knee    22.0  0.1          EMG   Side Muscle Nerve Root Ins Act Fibs Psw Amp Dur Poly Recrt Int Fraser Din Comment  Left AntTibialis Dp Br Peron L4-5 *Incr *2+ *2+ Nml Nml 0 *Reduced Nml   Left Fibularis Longus  Sup Br Peron L5-S1 *Incr Nml Nml Nml Nml 0 *Reduced Nml   Left MedGastroc Tibial S1-2 Nml Nml Nml Nml Nml 0 Nml Nml   Left VastusMed Femoral L2-4 *Incr Nml Nml Nml Nml 0 *Reduced Nml  Left BicepsFemS Sciatic L5-S1 Nml Nml Nml Nml Nml 0 Nml Nml     Nerve Conduction Studies Anti Sensory Left/Right Comparison   Stim Site L Lat (ms) R Lat (ms) L-R Lat (ms) L Amp (V) R Amp (V) L-R Amp (%) Site1 Site2 L Vel (m/s) R Vel (m/s) L-R Vel (m/s)  Saphenous Anti Sensory (Ant Med Mall)  30.5C  14cm 3.8   6.3   14cm Ant Med Mall     Sup Fibular Anti Sensory (Ant Lat Mall)  29.8C  14 cm       14 cm Ant Lat Mall     Sural Anti Sensory (Lat Mall)  30.1C  Calf       Calf Lat Mall      Motor Left/Right Comparison   Stim Site L Lat (ms) R Lat (ms) L-R Lat (ms) L Amp (mV) R Amp (mV) L-R Amp (%) Site1 Site2 L Vel (m/s) R Vel (m/s) L-R Vel (m/s)  Fibular Motor (Ext Dig Brev)  28.6C  Ankle 3.9   *0.1   B Fib Ankle *32    B Fib 13.8   0.1   Poplt B Fib *25    Poplt 17.8   0.2         Tibial Motor (Abd Hall Brev)  29.3C  Ankle *11.5   *0.2   Knee Ankle 35    Knee 22.0   0.1            Clinical History: L-spine MRI dated 10/17/2016  L3-4: Annular bulging, small RIGHT subarticular to extra foraminal disc protrusion. Mild facet arthropathy and ligamentum flavum redundancy without canal stenosis. Mild RIGHT neural foraminal narrowing.  L4-5: Small broad-based disc bulge, LEFT  subarticular annular fissure. Moderate RIGHT mild LEFT facet arthropathy and ligamentum flavum redundancy without canal stenosis. Minimal RIGHT, mild LEFT neural foraminal narrowing.  L5-S1: Moderate broad-based disc bulge, LEFT central annular fissure. Mild facet arthropathy. No canal stenosis. Moderate to severe RIGHT, severe LEFT neural foraminal narrowing.  IMPRESSION: Degenerative change of the lumbar spine.  No canal stenosis.  Neural foraminal narrowing L3-4 through L5-S1: Severe on the LEFT at L5-S1.  He reports that he quit smoking about 27 years ago. His smoking use included Cigarettes. He has a 30.00 pack-year smoking history. He has never used smokeless tobacco. No results for input(s): HGBA1C, LABURIC in the last 8760 hours.  Objective:  VS:  HT:    WT:   BMI:     BP:   HR: bpm  TEMP: ( )  RESP:  Physical Exam  Constitutional: He is oriented to person, place, and time. He appears well-developed and well-nourished. No distress.  HENT:  Head: Normocephalic and atraumatic.  Eyes: Conjunctivae are normal. Pupils are equal, round, and reactive to light.  Cardiovascular: Regular rhythm and intact distal pulses.   Pulmonary/Chest: Effort normal and breath sounds normal.  Musculoskeletal:  Lumbar spine range of motion was fairly normal. He did have some concordant back pain with extension rotation. No pain over the greater trochanters. He ambulated with some balance difficulties.  Neurological: He is alert and oriented to person, place, and time. No sensory deficit. He exhibits normal muscle tone.  The patient was neurologically intact distally although there were some atrophy of the intrinsic foot muscles. He has good intact sensation in all dermatomal patterns. He has no clonus bilaterally. There were no observed fasciculations. He did have atrophy of the left quadriceps musculature and had weakness with left knee extension but  not knee flexion or dorsiflexion or plantar  flexion.  Skin: Skin is warm.  Psychiatric: He has a normal mood and affect.    Ortho Exam Imaging: No results found.  Past Medical/Family/Surgical/Social History: Medications & Allergies reviewed per EMR Patient Active Problem List   Diagnosis Date Noted  . Malignant neoplasm of prostate ([place]) 11/17/2015  . Chronic kidney disease, stage 3, mod decreased GFR 12/27/2011  . Diabetes mellitus, type II ([place]) 12/27/2011  . Rheumatoid arthritis(714.0) 01/04/2011  . SINUS BRADYCARDIA 01/12/2010  . 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 HYPERLIPIDEMIA 01/08/2010  . Hypertension 01/08/2010   Past Medical History:  Diagnosis Date  . Arteriosclerotic cardiovascular disease (ASCVD) cardiologist-  dr Roderic Palau branch   Acute myocardial infarction treated with TPA in 1990; 1999-stents to circumflex and RCA; residual total occlusion of the left anterior descending; normal ejection fraction; stress nuclear in 2001-distal anteroseptal ischemia; normal LV function  . Chronic kidney disease, stage 3, mod decreased GFR nephrologist-  at [facility] in [place]   Creatinine-2.18 and 12/2007, 1.76 and 12/2008  . First degree heart block   . History of gastric ulcer   . History of MI (myocardial infarction)    1990-  treated w/ TPA  . Hyperlipidemia   . Hypertension   . OA (osteoarthritis)   . Prediabetes   . Prostate cancer [facility]) urologist-- dr dahlstedt/  oncologist- dr Tammi Klippel   Stage T1c , Gleason 3+4,  PSA 4.7,  vol 24cc--  scheduled for radiative seed implants  . Psoriatic arthritis ([place])   . RA (rheumatoid arthritis) ([place])    rheumologist-  dr Toni Amend  . S/P drug eluting coronary stent placement    1999--  DES x2 to CFX and RCA  . Sinus bradycardia   . Wears dentures    upper and lower partial   Family History  Problem Relation Age of Onset  . Heart attack Mother   . Coronary artery disease Father    Past Surgical History:  Procedure Laterality Date  .  CARDIOVASCULAR STRESS TEST  2001  per Dr Lattie Haw clinic note   distal anteroseptal ischemia,  normal LVF  . COLONOSCOPY  last one 2006 (approx)  . CORONARY ANGIOPLASTY WITH STENT PLACEMENT  1999   DES x2  to CFX and RCA/  residual total occlusion LAD,  normal LVEF  . RADIOACTIVE SEED IMPLANT N/A 01/11/2016   Procedure: RADIOACTIVE SEED IMPLANT/BRACHYTHERAPY IMPLANT;  Surgeon: Franchot Gallo, MD;  [facility]: [facility];  Service: Urology;  Laterality: N/A;   71  seeds implanted no seeds founds in bladder   Social History   Occupational History  . Retired Clinical biochemist    Social History Main Topics  . Smoking status: Former Smoker    Packs/day: 1.00    Years: 30.00    Types: Cigarettes    Quit date: 05/21/1989  . Smokeless tobacco: Never Used  . Alcohol use 21.0 oz/week    14 Shots of liquor, 21 Standard drinks or equivalent per week     Comment: 2-3 oz per day  . Drug use: No  . Sexual activity: Not on file

## 2016-11-25 ENCOUNTER — Other Ambulatory Visit: Payer: Self-pay | Admitting: Rheumatology

## 2016-11-25 DIAGNOSIS — Z79899 Other long term (current) drug therapy: Secondary | ICD-10-CM | POA: Diagnosis not present

## 2016-11-25 NOTE — Procedures (Signed)
EMG & NCV Findings: Evaluation of the left fibular motor nerve showed reduced amplitude (0.1 mV), decreased conduction velocity (B Fib-Ankle, 32 m/s), and decreased conduction velocity (Poplt-B Fib, 25 m/s).  The left tibial motor nerve showed prolonged distal onset latency (11.5 ms) and reduced amplitude (0.2 mV).  The left superficial fibular sensory nerve showed no response (14 cm).  The left sural sensory nerve showed no response (Calf).  All remaining nerves (as indicated in the following tables) were within normal limits.    Needle evaluation of the left anterior tibialis muscle showed increased insertional activity, moderately increased spontaneous activity, and diminished recruitment.  The left Fibularis Longus and the left vastus medialis muscles showed increased insertional activity and diminished recruitment.  All remaining muscles (as indicated in the following table) showed no evidence of electrical instability.    Impression: The above electrodiagnostic study is ABNORMAL and reveals evidence of severe chronic L4 radiculopathy on the left.  However, this test is very hard to interpret because of bilateral intrinsic foot muscle atrophy. This could be age-related or related to a peripheral polyneuropathy. There are difficulties finding sensory nerve action potentials and he may indeed have a polyneuropathy. He is been told in the past that he had borderline diabetes. This weakness on the left with knee flexion and quadriceps weakness could be a result of a neurogenic amyotrophy. This condition is more common in older males with diabetes. There is no electrodiagnostic evidence or clinical evidence of ALS or motor neuron disease.   Recommendations: 1.  Follow-up with referring physician. 2.  Continue current management of symptoms. Suggest consultation with neurology.     Nerve Conduction Studies Anti Sensory Summary Table   Stim Site NR Peak (ms) Norm Peak (ms) P-T Amp (V) Norm P-T Amp  Site1 Site2 Delta-P (ms) Dist (cm) Vel (m/s) Norm Vel (m/s)  Left Saphenous Anti Sensory (Ant Med Mall)  30.5C  14cm    3.8 <4.4 6.3 >2 14cm Ant Med Mall 3.8 0.0  >32  Left Sup Fibular Anti Sensory (Ant Lat Mall)  29.8C  14 cm *NR  <4.4  >5.0 14 cm Ant Lat Mall  14.0  >32  Left Sural Anti Sensory (Lat Mall)  30.1C  Calf *NR  <4.0  >5.0 Calf Lat Mall  14.0  >35   Motor Summary Table   Stim Site NR Onset (ms) Norm Onset (ms) O-P Amp (mV) Norm O-P Amp Site1 Site2 Delta-0 (ms) Dist (cm) Vel (m/s) Norm Vel (m/s)  Left Fibular Motor (Ext Dig Brev)  28.6C  Ankle    3.9 <6.1 *0.1 >2.5 B Fib Ankle 9.9 31.5 *32 >38  B Fib    13.8  0.1  Poplt B Fib 4.0 10.0 *25 >40  Poplt    17.8  0.2         Left Tibial Motor (Abd Hall Brev)  29.3C  Ankle    *11.5 <6.1 *0.2 >3.0 Knee Ankle 10.5 37.0 35 >35  Knee    22.0  0.1          EMG   Side Muscle Nerve Root Ins Act Fibs Psw Amp Dur Poly Recrt Int Fraser Din Comment  Left AntTibialis Dp Br Peron L4-5 *Incr *2+ *2+ Nml Nml 0 *Reduced Nml   Left Fibularis Longus  Sup Br Peron L5-S1 *Incr Nml Nml Nml Nml 0 *Reduced Nml   Left MedGastroc Tibial S1-2 Nml Nml Nml Nml Nml 0 Nml Nml   Left VastusMed Femoral L2-4 *Incr Nml Nml Nml  Nml 0 *Reduced Nml   Left BicepsFemS Sciatic L5-S1 Nml Nml Nml Nml Nml 0 Nml Nml     Nerve Conduction Studies Anti Sensory Left/Right Comparison   Stim Site L Lat (ms) R Lat (ms) L-R Lat (ms) L Amp (V) R Amp (V) L-R Amp (%) Site1 Site2 L Vel (m/s) R Vel (m/s) L-R Vel (m/s)  Saphenous Anti Sensory (Ant Med Mall)  30.5C  14cm 3.8   6.3   14cm Ant Med Mall     Sup Fibular Anti Sensory (Ant Lat Mall)  29.8C  14 cm       14 cm Ant Lat Mall     Sural Anti Sensory (Lat Mall)  30.1C  Calf       Calf Lat Mall      Motor Left/Right Comparison   Stim Site L Lat (ms) R Lat (ms) L-R Lat (ms) L Amp (mV) R Amp (mV) L-R Amp (%) Site1 Site2 L Vel (m/s) R Vel (m/s) L-R Vel (m/s)  Fibular Motor (Ext Dig Brev)  28.6C  Ankle 3.9   *0.1   B Fib  Ankle *32    B Fib 13.8   0.1   Poplt B Fib *25    Poplt 17.8   0.2         Tibial Motor (Abd Hall Brev)  29.3C  Ankle *11.5   *0.2   Knee Ankle 35    Knee 22.0   0.1

## 2016-11-26 LAB — COMPLETE METABOLIC PANEL WITH GFR
ALT: 25 U/L (ref 9–46)
AST: 18 U/L (ref 10–35)
Albumin: 3.6 g/dL (ref 3.6–5.1)
Alkaline Phosphatase: 92 U/L (ref 40–115)
BUN: 20 mg/dL (ref 7–25)
CO2: 27 mmol/L (ref 20–31)
Calcium: 9.3 mg/dL (ref 8.6–10.3)
Chloride: 104 mmol/L (ref 98–110)
Creat: 1.19 mg/dL — ABNORMAL HIGH (ref 0.70–1.18)
GFR, Est African American: 70 mL/min (ref >=60)
GFR, Est Non African American: 61 mL/min (ref >=60)
Glucose, Bld: 133 mg/dL — ABNORMAL HIGH (ref 65–99)
Potassium: 5 mmol/L (ref 3.5–5.3)
Sodium: 141 mmol/L (ref 135–146)
Total Bilirubin: 0.5 mg/dL (ref 0.2–1.2)
Total Protein: 6.4 g/dL (ref 6.1–8.1)

## 2016-11-26 LAB — CBC WITH DIFFERENTIAL/PLATELET
Basophils Absolute: 0 cells/uL (ref 0–200)
Basophils Relative: 0 %
Eosinophils Absolute: 176 cells/uL (ref 15–500)
Eosinophils Relative: 2 %
HCT: 41.7 % (ref 38.5–50.0)
Hemoglobin: 13.8 g/dL (ref 13.2–17.1)
Lymphocytes Relative: 10 %
Lymphs Abs: 880 cells/uL (ref 850–3900)
MCH: 29.5 pg (ref 27.0–33.0)
MCHC: 33.1 g/dL (ref 32.0–36.0)
MCV: 89.1 fL (ref 80.0–100.0)
MPV: 11.6 fL (ref 7.5–12.5)
Monocytes Absolute: 1232 cells/uL — ABNORMAL HIGH (ref 200–950)
Monocytes Relative: 14 %
Neutro Abs: 6512 cells/uL (ref 1500–7800)
Neutrophils Relative %: 74 %
Platelets: 163 10*3/uL (ref 140–400)
RBC: 4.68 MIL/uL (ref 4.20–5.80)
RDW: 14 % (ref 11.0–15.0)
WBC: 8.8 10*3/uL (ref 3.8–10.8)

## 2016-11-26 NOTE — Progress Notes (Signed)
Labs are stable. We'll continue to monitor

## 2016-12-05 ENCOUNTER — Ambulatory Visit (INDEPENDENT_AMBULATORY_CARE_PROVIDER_SITE_OTHER): Payer: Commercial Managed Care - HMO | Admitting: Specialist

## 2016-12-05 ENCOUNTER — Encounter (INDEPENDENT_AMBULATORY_CARE_PROVIDER_SITE_OTHER): Payer: Self-pay

## 2016-12-05 ENCOUNTER — Encounter (INDEPENDENT_AMBULATORY_CARE_PROVIDER_SITE_OTHER): Payer: Self-pay | Admitting: Specialist

## 2016-12-05 VITALS — BP 133/78 | HR 59 | Ht 70.0 in | Wt 198.0 lb

## 2016-12-05 DIAGNOSIS — M62838 Other muscle spasm: Secondary | ICD-10-CM

## 2016-12-05 DIAGNOSIS — M5416 Radiculopathy, lumbar region: Secondary | ICD-10-CM | POA: Diagnosis not present

## 2016-12-05 DIAGNOSIS — M48062 Spinal stenosis, lumbar region with neurogenic claudication: Secondary | ICD-10-CM | POA: Diagnosis not present

## 2016-12-05 DIAGNOSIS — R29898 Other symptoms and signs involving the musculoskeletal system: Secondary | ICD-10-CM | POA: Diagnosis not present

## 2016-12-05 MED ORDER — GABAPENTIN 100 MG PO CAPS: ORAL_CAPSULE | ORAL | 4 refills | Status: DC

## 2016-12-05 MED ORDER — BACLOFEN 10 MG PO TABS: 10.0000 mg | ORAL_TABLET | Freq: Three times a day (TID) | ORAL | 3 refills | Status: DC | PRN

## 2016-12-05 NOTE — Patient Instructions (Signed)
Avoid bending, stooping and avoid lifting weights greater than 10 lbs. Avoid prolong standing and walking. Avoid frequent bending and stooping  No lifting greater than 10 lbs. May use ice or moist heat for pain. Will  Schedule an appointment to be seen by a neurologist, Dr. Merlene Laughter in [place]. Handicap license is approved.

## 2016-12-05 NOTE — Progress Notes (Signed)
Office Visit Note   Patient: Jacob Farmer           Date of Birth: 09-Jun-1946           MRN: [id number] Visit Date: 12/05/2016              Requested by: Redmond School, MD [address] [place], [place] [address] PCP: Glo Herring., MD   Assessment & Plan: Visit Diagnoses:  1. Chronic left lumbar radiculopathy   2. Left leg weakness   3. Spinal stenosis of lumbar region with neurogenic claudication   4. Spasm of muscle     Plan: Avoid bending, stooping and avoid lifting weights greater than 10 lbs. Avoid prolong standing and walking. Avoid frequent bending and stooping  No lifting greater than 10 lbs. May use ice or moist heat for pain. Will  Schedule an appointment to be seen by a neurologist, Dr. Merlene Laughter in [place]. Handicap license is approved.   Follow-Up Instructions: Return in about 6 weeks (around 01/16/2017) for cancel the scheduled appt for 1/4 and schedule for in 6 weks..   Orders:  Orders Placed This Encounter  Procedures  . For home use only DME 4 wheeled rolling walker with seat  . Ambulatory referral to Neurology   Meds ordered this encounter  Medications  . gabapentin (NEURONTIN) 100 MG capsule    Sig: Take 2 tablets po at HS.    Dispense:  180 capsule    Refill:  4  . baclofen (LIORESAL) 10 MG tablet    Sig: Take 1 tablet (10 mg total) by mouth every 8 (eight) hours as needed for muscle spasms (Pain).    Dispense:  270 tablet    Refill:  3      Procedures: No procedures performed   Clinical Data: No additional findings.   Subjective: Chief Complaint  Patient presents with  . Lower Back - Pain, Follow-up    Patient is returning today to review EMG/NCS. Still having some back pain but radiating into left leg. Has fallen twice since last seen. Has fallen since last visit, 11/04/2016. He is holding onto the shopping cart, but does not want to use a walker. Has been to PT for strengthening at [facility], it did not help. MRI  done not showing significant stenosis or nerve compression Left L3 or L4. EMGs are showing severe left L4 radiculopathy. History of diabetes.     Review of Systems  HENT: Negative.   Eyes: Negative.   Respiratory: Negative.   Cardiovascular: Negative.   Gastrointestinal: Negative.   Endocrine: Negative.   Genitourinary: Negative.   Musculoskeletal: Positive for back pain and gait problem.  Allergic/Immunologic: Negative.   Neurological: Positive for weakness and numbness.  Hematological: Negative.   Psychiatric/Behavioral: Negative.      Objective: Vital Signs: BP 133/78   Pulse (!) 59   Ht 5\' 10"  (1.778 m)   Wt 198 lb (89.8 kg)   BMI 28.41 kg/m   Physical Exam  Constitutional: He appears well-developed and well-nourished.  Eyes: EOM are normal. Pupils are equal, round, and reactive to light.  Neck: Normal range of motion. Neck supple.  Pulmonary/Chest: Effort normal and breath sounds normal.  Abdominal: Soft. Bowel sounds are normal.  Musculoskeletal: Normal range of motion.  Skin: Skin is warm and dry.    Back Exam   Tenderness  The patient is experiencing tenderness in the lumbar.  Range of Motion  Extension: normal  Flexion: normal  Lateral Bend Right: normal  Lateral Bend Left: normal  Rotation Right: normal  Rotation Left: normal   Muscle Strength  Right Quadriceps:  5/5  Left Quadriceps:  3/5  Right Hamstrings:  5/5  Left Hamstrings:  5/5   Tests  Straight leg raise right: negative Straight leg raise left: negative  Reflexes  Patellar: abnormal Achilles: abnormal Biceps: abnormal Babinski's sign: normal   Other  Toe Walk: normal Heel Walk: normal Sensation: normal Gait: abnormal  Erythema: no back redness Scars: absent      Specialty Comments:  No specialty comments available.  Imaging: No results found.   PMFS History: Patient Active Problem List   Diagnosis Date Noted  . Malignant neoplasm of prostate ([place]) 11/17/2015    . Chronic kidney disease, stage 3, mod decreased GFR 12/27/2011  . Diabetes mellitus, type II ([place]) 12/27/2011  . Rheumatoid arthritis(714.0) 01/04/2011  . SINUS BRADYCARDIA 01/12/2010  . 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 HYPERLIPIDEMIA 01/08/2010  . Hypertension 01/08/2010   Past Medical History:  Diagnosis Date  . Arteriosclerotic cardiovascular disease (ASCVD) cardiologist-  dr Roderic Palau branch   Acute myocardial infarction treated with TPA in 1990; 1999-stents to circumflex and RCA; residual total occlusion of the left anterior descending; normal ejection fraction; stress nuclear in 2001-distal anteroseptal ischemia; normal LV function  . Chronic kidney disease, stage 3, mod decreased GFR nephrologist-  at [facility] in [place]   Creatinine-2.18 and 12/2007, 1.76 and 12/2008  . First degree heart block   . History of gastric ulcer   . History of MI (myocardial infarction)    1990-  treated w/ TPA  . Hyperlipidemia   . Hypertension   . OA (osteoarthritis)   . Prediabetes   . Prostate cancer [facility]) urologist-- dr dahlstedt/  oncologist- dr Tammi Klippel   Stage T1c , Gleason 3+4,  PSA 4.7,  vol 24cc--  scheduled for radiative seed implants  . Psoriatic arthritis ([place])   . RA (rheumatoid arthritis) ([place])    rheumologist-  dr Toni Amend  . S/P drug eluting coronary stent placement    1999--  DES x2 to CFX and RCA  . Sinus bradycardia   . Wears dentures    upper and lower partial    Family History  Problem Relation Age of Onset  . Heart attack Mother   . Coronary artery disease Father     Past Surgical History:  Procedure Laterality Date  . CARDIOVASCULAR STRESS TEST  2001  per Dr Lattie Haw clinic note   distal anteroseptal ischemia,  normal LVF  . COLONOSCOPY  last one 2006 (approx)  . CORONARY ANGIOPLASTY WITH STENT PLACEMENT  1999   DES x2  to CFX and RCA/  residual total occlusion LAD,  normal LVEF  . RADIOACTIVE SEED IMPLANT N/A 01/11/2016    Procedure: RADIOACTIVE SEED IMPLANT/BRACHYTHERAPY IMPLANT;  Surgeon: Franchot Gallo, MD;  Location: [facility];  Service: Urology;  Laterality: N/A;   73  seeds implanted no seeds founds in bladder   Social History   Occupational History  . Retired Clinical biochemist    Social History Main Topics  . Smoking status: Former Smoker    Packs/day: 1.00    Years: 30.00    Types: Cigarettes    Quit date: 05/21/1989  . Smokeless tobacco: Never Used  . Alcohol use 21.0 oz/week    14 Shots of liquor, 21 Standard drinks or equivalent per week     Comment: 2-3 oz per day  . Drug use: No  . Sexual activity: Not  on file

## 2016-12-11 ENCOUNTER — Telehealth (INDEPENDENT_AMBULATORY_CARE_PROVIDER_SITE_OTHER): Payer: Self-pay | Admitting: Specialist

## 2016-12-11 NOTE — Telephone Encounter (Signed)
Pt needs wheelchair and wife said Jacob Farmer needs authorization for a wheelchair.  She also said pt needs refill of gabapentin.  carolyns number is [phone]

## 2016-12-12 ENCOUNTER — Other Ambulatory Visit (INDEPENDENT_AMBULATORY_CARE_PROVIDER_SITE_OTHER): Payer: Self-pay | Admitting: Specialist

## 2016-12-12 DIAGNOSIS — M5416 Radiculopathy, lumbar region: Secondary | ICD-10-CM

## 2016-12-12 DIAGNOSIS — M48062 Spinal stenosis, lumbar region with neurogenic claudication: Secondary | ICD-10-CM

## 2016-12-12 NOTE — Telephone Encounter (Signed)
Please advise 

## 2016-12-12 NOTE — Telephone Encounter (Signed)
Rx for wheelchair printed and signed, Was given a prescription for gabaentin 12/21, request is too early for refill.

## 2016-12-13 NOTE — Telephone Encounter (Signed)
Patient aware it would probably easier to go to medical supply place

## 2016-12-19 ENCOUNTER — Ambulatory Visit (INDEPENDENT_AMBULATORY_CARE_PROVIDER_SITE_OTHER): Payer: Commercial Managed Care - HMO | Admitting: Specialist

## 2016-12-20 ENCOUNTER — Telehealth (INDEPENDENT_AMBULATORY_CARE_PROVIDER_SITE_OTHER): Payer: Self-pay | Admitting: Specialist

## 2016-12-20 DIAGNOSIS — R29898 Other symptoms and signs involving the musculoskeletal system: Secondary | ICD-10-CM | POA: Diagnosis not present

## 2016-12-20 DIAGNOSIS — M5416 Radiculopathy, lumbar region: Secondary | ICD-10-CM | POA: Diagnosis not present

## 2016-12-20 DIAGNOSIS — M62838 Other muscle spasm: Secondary | ICD-10-CM | POA: Diagnosis not present

## 2016-12-20 DIAGNOSIS — M48062 Spinal stenosis, lumbar region with neurogenic claudication: Secondary | ICD-10-CM | POA: Diagnosis not present

## 2016-12-20 NOTE — Telephone Encounter (Signed)
[facility] called for diagnosis code for wheelchair CB:[phone]

## 2016-12-23 NOTE — Telephone Encounter (Signed)
Faxed requested info to [facility] M54.16 R29.898 M48.062 [phone]

## 2017-01-09 DIAGNOSIS — M069 Rheumatoid arthritis, unspecified: Secondary | ICD-10-CM | POA: Diagnosis not present

## 2017-01-09 DIAGNOSIS — N183 Chronic kidney disease, stage 3 (moderate): Secondary | ICD-10-CM | POA: Diagnosis not present

## 2017-01-09 DIAGNOSIS — I10 Essential (primary) hypertension: Secondary | ICD-10-CM | POA: Diagnosis not present

## 2017-01-09 DIAGNOSIS — C61 Malignant neoplasm of prostate: Secondary | ICD-10-CM | POA: Diagnosis not present

## 2017-01-09 DIAGNOSIS — E663 Overweight: Secondary | ICD-10-CM | POA: Diagnosis not present

## 2017-01-09 DIAGNOSIS — M5432 Sciatica, left side: Secondary | ICD-10-CM | POA: Diagnosis not present

## 2017-01-09 DIAGNOSIS — E782 Mixed hyperlipidemia: Secondary | ICD-10-CM | POA: Diagnosis not present

## 2017-01-09 DIAGNOSIS — Z6828 Body mass index (BMI) 28.0-28.9, adult: Secondary | ICD-10-CM | POA: Diagnosis not present

## 2017-01-09 DIAGNOSIS — E119 Type 2 diabetes mellitus without complications: Secondary | ICD-10-CM | POA: Diagnosis not present

## 2017-01-14 DIAGNOSIS — G4701 Insomnia due to medical condition: Secondary | ICD-10-CM | POA: Diagnosis not present

## 2017-01-14 DIAGNOSIS — E1144 Type 2 diabetes mellitus with diabetic amyotrophy: Secondary | ICD-10-CM | POA: Diagnosis not present

## 2017-01-14 DIAGNOSIS — M130 Polyarthritis, unspecified: Secondary | ICD-10-CM | POA: Diagnosis not present

## 2017-01-14 DIAGNOSIS — R252 Cramp and spasm: Secondary | ICD-10-CM | POA: Diagnosis not present

## 2017-01-14 DIAGNOSIS — R269 Unspecified abnormalities of gait and mobility: Secondary | ICD-10-CM | POA: Diagnosis not present

## 2017-01-17 ENCOUNTER — Encounter (INDEPENDENT_AMBULATORY_CARE_PROVIDER_SITE_OTHER): Payer: Self-pay | Admitting: Specialist

## 2017-01-17 ENCOUNTER — Ambulatory Visit (INDEPENDENT_AMBULATORY_CARE_PROVIDER_SITE_OTHER): Payer: Self-pay | Admitting: Specialist

## 2017-01-17 ENCOUNTER — Other Ambulatory Visit: Payer: Self-pay | Admitting: Rheumatology

## 2017-01-17 VITALS — BP 124/64 | HR 51 | Ht 70.0 in | Wt 198.0 lb

## 2017-01-17 DIAGNOSIS — M5416 Radiculopathy, lumbar region: Secondary | ICD-10-CM | POA: Diagnosis not present

## 2017-01-17 MED ORDER — PREGABALIN 50 MG PO CAPS: 50.0000 mg | ORAL_CAPSULE | Freq: Two times a day (BID) | ORAL | 3 refills | Status: DC

## 2017-01-17 NOTE — Patient Instructions (Signed)
Avoid bending, stooping and avoid lifting weights greater than 10 lbs. Avoid prolong standing and walking. Avoid frequent bending and stooping  No lifting greater than 10 lbs. May use ice or moist heat for pain. Weight loss is of benefit. Handicap license is approved. Dr. Newton's secretary/Assistant will call to arrange for epidural steroid injection  

## 2017-01-17 NOTE — Progress Notes (Signed)
Office Visit Note   Patient: Jacob Farmer           Date of Birth: 1946-09-13           MRN: [id number] Visit Date: 01/17/2017              Requested by: Redmond School, MD [address] [place], [place] [address] PCP: Glo Herring., MD   Assessment & Plan: Visit Diagnoses:  1. Radiculopathy, lumbar region     Plan: Avoid bending, stooping and avoid lifting weights greater than 10 lbs. Avoid prolong standing and walking. Avoid frequent bending and stooping  No lifting greater than 10 lbs. May use ice or moist heat for pain. Weight loss is of benefit. Handicap license is approved. Dr. Romona Curls secretary/Assistant will call to arrange for epidural steroid injection   Follow-Up Instructions: Return in about 4 weeks (around 02/14/2017).   Orders:  Orders Placed This Encounter  Procedures  . Ambulatory referral to Physical Medicine Rehab   Meds ordered this encounter  Medications  . pregabalin (LYRICA) 50 MG capsule    Sig: Take 1 capsule (50 mg total) by mouth 2 (two) times daily.    Dispense:  60 capsule    Refill:  3      Procedures: No procedures performed   Clinical Data: No additional findings.   Subjective: Chief Complaint  Patient presents with  . Lower Back - Follow-up    Jacob Farmer is here to follow up on his low back pain and left leg weakness.  He states that it is slowly getting better, leg is getting stronger.  Two nights ago he had a bad night, last night he had a good night.    Review of Systems  Constitutional: Negative.   HENT: Negative.   Eyes: Negative.   Respiratory: Negative.   Cardiovascular: Negative.   Gastrointestinal: Negative.   Endocrine: Negative.   Genitourinary: Negative.   Musculoskeletal: Negative.   Skin: Negative.   Allergic/Immunologic: Negative.   Neurological: Negative.   Hematological: Negative.   Psychiatric/Behavioral: Negative.      Objective: Vital Signs: BP 124/64 (BP Location: Left Arm,  Patient Position: Sitting)   Pulse (!) 51   Ht 5\' 10"  (1.778 m)   Wt 198 lb (89.8 kg)   BMI 28.41 kg/m   Physical Exam  Constitutional: He is oriented to person, place, and time. He appears well-developed and well-nourished.  HENT:  Head: Normocephalic and atraumatic.  Eyes: EOM are normal. Pupils are equal, round, and reactive to light.  Neck: Normal range of motion. Neck supple.  Pulmonary/Chest: Effort normal and breath sounds normal.  Abdominal: Soft. Bowel sounds are normal.  Musculoskeletal: Normal range of motion.  Neurological: He is alert and oriented to person, place, and time. He displays abnormal reflex. A sensory deficit is present.  Skin: Skin is warm and dry.  Psychiatric: He has a normal mood and affect. His behavior is normal. Judgment and thought content normal.    Back Exam   Tenderness  The patient is experiencing tenderness in the lumbar.  Range of Motion  Extension: normal  Flexion: normal  Lateral Bend Right: normal  Lateral Bend Left: normal  Rotation Right: normal  Rotation Left: normal   Muscle Strength  Right Quadriceps:  5/5  Left Quadriceps:  4/5  Right Hamstrings:  5/5  Left Hamstrings:  5/5   Tests  Straight leg raise right: negative Straight leg raise left: negative  Reflexes  Patellar:  1/4 abnormal Achilles:  2/4 Babinski's sign: normal   Other  Erythema: no back redness Scars: absent      Specialty Comments:  No specialty comments available.  Imaging: No results found.   PMFS History: Patient Active Problem List   Diagnosis Date Noted  . Malignant neoplasm of prostate ([place]) 11/17/2015  . Chronic kidney disease, stage 3, mod decreased GFR 12/27/2011  . Diabetes mellitus, type II ([place]) 12/27/2011  . Rheumatoid arthritis(714.0) 01/04/2011  . SINUS BRADYCARDIA 01/12/2010  . 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 HYPERLIPIDEMIA 01/08/2010  . Hypertension 01/08/2010   Past Medical History:    Diagnosis Date  . Arteriosclerotic cardiovascular disease (ASCVD) cardiologist-  dr Roderic Palau branch   Acute myocardial infarction treated with TPA in 1990; 1999-stents to circumflex and RCA; residual total occlusion of the left anterior descending; normal ejection fraction; stress nuclear in 2001-distal anteroseptal ischemia; normal LV function  . Chronic kidney disease, stage 3, mod decreased GFR nephrologist-  at [facility] in [place]   Creatinine-2.18 and 12/2007, 1.76 and 12/2008  . First degree heart block   . History of gastric ulcer   . History of MI (myocardial infarction)    1990-  treated w/ TPA  . Hyperlipidemia   . Hypertension   . OA (osteoarthritis)   . Prediabetes   . Prostate cancer [facility]) urologist-- dr dahlstedt/  oncologist- dr Tammi Klippel   Stage T1c , Gleason 3+4,  PSA 4.7,  vol 24cc--  scheduled for radiative seed implants  . Psoriatic arthritis ([place])   . RA (rheumatoid arthritis) ([place])    rheumologist-  dr Toni Amend  . S/P drug eluting coronary stent placement    1999--  DES x2 to CFX and RCA  . Sinus bradycardia   . Wears dentures    upper and lower partial    Family History  Problem Relation Age of Onset  . Heart attack Mother   . Coronary artery disease Father     Past Surgical History:  Procedure Laterality Date  . CARDIOVASCULAR STRESS TEST  2001  per Dr Lattie Haw clinic note   distal anteroseptal ischemia,  normal LVF  . COLONOSCOPY  last one 2006 (approx)  . CORONARY ANGIOPLASTY WITH STENT PLACEMENT  1999   DES x2  to CFX and RCA/  residual total occlusion LAD,  normal LVEF  . RADIOACTIVE SEED IMPLANT N/A 01/11/2016   Procedure: RADIOACTIVE SEED IMPLANT/BRACHYTHERAPY IMPLANT;  Surgeon: Franchot Gallo, MD;  Location: [facility];  Service: Urology;  Laterality: N/A;   71  seeds implanted no seeds founds in bladder   Social History   Occupational History  . Retired Clinical biochemist    Social History Main Topics  . Smoking status:  Former Smoker    Packs/day: 1.00    Years: 30.00    Types: Cigarettes    Quit date: 05/21/1989  . Smokeless tobacco: Never Used  . Alcohol use 21.0 oz/week    14 Shots of liquor, 21 Standard drinks or equivalent per week     Comment: 2-3 oz per day  . Drug use: No  . Sexual activity: Not on file

## 2017-01-17 NOTE — Telephone Encounter (Signed)
Last Visit: 09/13/16 Next Visit: 02/05/17 Labs: 11/25/16 Stable  Okay to refill Allopurinol?

## 2017-01-27 ENCOUNTER — Other Ambulatory Visit (INDEPENDENT_AMBULATORY_CARE_PROVIDER_SITE_OTHER): Payer: Self-pay | Admitting: Physical Medicine and Rehabilitation

## 2017-01-27 ENCOUNTER — Other Ambulatory Visit: Payer: Self-pay | Admitting: Rheumatology

## 2017-01-27 DIAGNOSIS — Z79899 Other long term (current) drug therapy: Secondary | ICD-10-CM | POA: Diagnosis not present

## 2017-01-28 DIAGNOSIS — Z8546 Personal history of malignant neoplasm of prostate: Secondary | ICD-10-CM | POA: Insufficient documentation

## 2017-01-28 DIAGNOSIS — Z87448 Personal history of other diseases of urinary system: Secondary | ICD-10-CM | POA: Insufficient documentation

## 2017-01-28 DIAGNOSIS — Z79899 Other long term (current) drug therapy: Secondary | ICD-10-CM | POA: Insufficient documentation

## 2017-01-28 LAB — COMPLETE METABOLIC PANEL WITH GFR
ALT: 24 U/L (ref 9–46)
AST: 19 U/L (ref 10–35)
Albumin: 3.8 g/dL (ref 3.6–5.1)
Alkaline Phosphatase: 109 U/L (ref 40–115)
BUN: 17 mg/dL (ref 7–25)
CO2: 30 mmol/L (ref 20–31)
Calcium: 9.4 mg/dL (ref 8.6–10.3)
Chloride: 107 mmol/L (ref 98–110)
Creat: 1.27 mg/dL — ABNORMAL HIGH (ref 0.70–1.18)
GFR, Est African American: 65 mL/min (ref >=60)
GFR, Est Non African American: 56 mL/min — ABNORMAL LOW (ref >=60)
Glucose, Bld: 84 mg/dL (ref 65–99)
Potassium: 5 mmol/L (ref 3.5–5.3)
Sodium: 143 mmol/L (ref 135–146)
Total Bilirubin: 0.4 mg/dL (ref 0.2–1.2)
Total Protein: 6.6 g/dL (ref 6.1–8.1)

## 2017-01-28 LAB — CBC WITH DIFFERENTIAL/PLATELET
Basophils Absolute: 0 cells/uL (ref 0–200)
Basophils Relative: 0 %
Eosinophils Absolute: 320 cells/uL (ref 15–500)
Eosinophils Relative: 4 %
HCT: 44.4 % (ref 38.5–50.0)
Hemoglobin: 14.5 g/dL (ref 13.2–17.1)
Lymphocytes Relative: 21 %
Lymphs Abs: 1680 cells/uL (ref 850–3900)
MCH: 28.9 pg (ref 27.0–33.0)
MCHC: 32.7 g/dL (ref 32.0–36.0)
MCV: 88.4 fL (ref 80.0–100.0)
MPV: 11.5 fL (ref 7.5–12.5)
Monocytes Absolute: 1120 cells/uL — ABNORMAL HIGH (ref 200–950)
Monocytes Relative: 14 %
Neutro Abs: 4880 cells/uL (ref 1500–7800)
Neutrophils Relative %: 61 %
Platelets: 188 10*3/uL (ref 140–400)
RBC: 5.02 MIL/uL (ref 4.20–5.80)
RDW: 13.7 % (ref 11.0–15.0)
WBC: 8 10*3/uL (ref 3.8–10.8)

## 2017-01-28 NOTE — Telephone Encounter (Signed)
Patient's kidney function is slightly elevated compared to December 2017 labs.We can monitor on the next labs.  We will continue leflunomide for now

## 2017-01-28 NOTE — Progress Notes (Deleted)
Office Visit Note  Patient: Jacob Farmer             Date of Birth: October 18, 1946           MRN: [id number]             PCP: Glo Herring., MD Referring: Redmond School, MD Visit Date: 02/05/2017 Occupation: '@GUAROCC'$ @    Subjective:  No chief complaint on file.   History of Present Illness: Jacob Farmer is a 71 y.o. male ***   Activities of Daily Living:  Patient reports morning stiffness for *** {minute/hour:19697}.   Patient {ACTIONS;DENIES/REPORTS:21021675::"Denies"} nocturnal pain.  Difficulty dressing/grooming: {ACTIONS;DENIES/REPORTS:21021675::"Denies"} Difficulty climbing stairs: {ACTIONS;DENIES/REPORTS:21021675::"Denies"} Difficulty getting out of chair: {ACTIONS;DENIES/REPORTS:21021675::"Denies"} Difficulty using hands for taps, buttons, cutlery, and/or writing: {ACTIONS;DENIES/REPORTS:21021675::"Denies"}   No Rheumatology ROS completed.   PMFS History:  Patient Active Problem List   Diagnosis Date Noted  . High risk medication use 01/28/2017  . History of prostate cancer 01/28/2017  . History of renal insufficiency syndrome 01/28/2017  . Malignant neoplasm of prostate ([place]) 11/17/2015  . Chronic kidney disease, stage 3, mod decreased GFR 12/27/2011  . Diabetes mellitus, type II ([place]) 12/27/2011  . Rheumatoid arthritis ([place]) 01/04/2011  . SINUS BRADYCARDIA 01/12/2010  . 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 HYPERLIPIDEMIA 01/08/2010  . Hypertension 01/08/2010    Past Medical History:  Diagnosis Date  . Arteriosclerotic cardiovascular disease (ASCVD) cardiologist-  dr Roderic Palau branch   Acute myocardial infarction treated with TPA in 1990; 1999-stents to circumflex and RCA; residual total occlusion of the left anterior descending; normal ejection fraction; stress nuclear in 2001-distal anteroseptal ischemia; normal LV function  . Chronic kidney disease, stage 3, mod decreased GFR nephrologist-  at [facility] in [place]   Creatinine-2.18 and 12/2007, 1.76 and 12/2008  . First degree heart block   . History of gastric ulcer   . History of MI (myocardial infarction)    1990-  treated w/ TPA  . Hyperlipidemia   . Hypertension   . OA (osteoarthritis)   . Prediabetes   . Prostate cancer [facility]) urologist-- dr dahlstedt/  oncologist- dr Tammi Klippel   Stage T1c , Gleason 3+4,  PSA 4.7,  vol 24cc--  scheduled for radiative seed implants  . Psoriatic arthritis ([place])   . RA (rheumatoid arthritis) ([place])    rheumologist-  dr Toni Amend  . S/P drug eluting coronary stent placement    1999--  DES x2 to CFX and RCA  . Sinus bradycardia   . Wears dentures    upper and lower partial    Family History  Problem Relation Age of Onset  . Heart attack Mother   . Coronary artery disease Father    Past Surgical History:  Procedure Laterality Date  . CARDIOVASCULAR STRESS TEST  2001  per Dr Lattie Haw clinic note   distal anteroseptal ischemia,  normal LVF  . COLONOSCOPY  last one 2006 (approx)  . CORONARY ANGIOPLASTY WITH STENT PLACEMENT  1999   DES x2  to CFX and RCA/  residual total occlusion LAD,  normal LVEF  . RADIOACTIVE SEED IMPLANT N/A 01/11/2016   Procedure: RADIOACTIVE SEED IMPLANT/BRACHYTHERAPY IMPLANT;  Surgeon: Franchot Gallo, MD;  Location: [facility];  Service: Urology;  Laterality: N/A;   71  seeds implanted no seeds founds in bladder   Social History   Social History Narrative   No regular exercise     Objective: Vital Signs: There were no vitals taken for this visit.   Physical Exam  Musculoskeletal Exam: ***  CDAI Exam: No CDAI exam completed.    Investigation: Findings:  On November 30, 2009, CBC was normal.  Sed rate was 89.  Comprehensive metabolic panel showed glucose of 123.  Creatinine was 1.61 and ALT was 59.  Uric acid, rheumatoid factor, anti-CCP, ANA, ACE level, and HLAB-27 were all within normal limits.    02/02/2010 chest x-ray  showed stable emphysema without  evidence of air space disease or lymphadenopathy.  On January 20, 2010, CBC was normal.  Sed rate was 85.  ALT, AST, and creatinine were normal.  Hepatitis panel was negative.    Orders Only on 01/27/2017  Component Date Value Ref Range Status  . Sodium 01/27/2017 143  135 - 146 mmol/L Final  . Potassium 01/27/2017 5.0  3.5 - 5.3 mmol/L Final  . Chloride 01/27/2017 107  98 - 110 mmol/L Final  . CO2 01/27/2017 30  20 - 31 mmol/L Final  . Glucose, Bld 01/27/2017 84  65 - 99 mg/dL Final  . BUN 01/27/2017 17  7 - 25 mg/dL Final  . Creat 01/27/2017 1.27* 0.70 - 1.18 mg/dL Final   Comment:   For patients > or = 73 years of age: The upper reference limit for Creatinine is approximately 13% higher for people identified as African-American.     . Total Bilirubin 01/27/2017 0.4  0.2 - 1.2 mg/dL Final  . Alkaline Phosphatase 01/27/2017 109  40 - 115 U/L Final  . AST 01/27/2017 19  10 - 35 U/L Final  . ALT 01/27/2017 24  9 - 46 U/L Final  . Total Protein 01/27/2017 6.6  6.1 - 8.1 g/dL Final  . Albumin 01/27/2017 3.8  3.6 - 5.1 g/dL Final  . Calcium 01/27/2017 9.4  8.6 - 10.3 mg/dL Final  . GFR, Est African American 01/27/2017 65  >=60 mL/min Final  . GFR, Est Non African American 01/27/2017 56* >=60 mL/min Final  . WBC 01/27/2017 8.0  3.8 - 10.8 K/uL Final  . RBC 01/27/2017 5.02  4.20 - 5.80 MIL/uL Final  . Hemoglobin 01/27/2017 14.5  13.2 - 17.1 g/dL Final  . HCT 01/27/2017 44.4  38.5 - 50.0 % Final  . MCV 01/27/2017 88.4  80.0 - 100.0 fL Final  . MCH 01/27/2017 28.9  27.0 - 33.0 pg Final  . MCHC 01/27/2017 32.7  32.0 - 36.0 g/dL Final  . RDW 01/27/2017 13.7  11.0 - 15.0 % Final  . Platelets 01/27/2017 188  140 - 400 K/uL Final  . MPV 01/27/2017 11.5  7.5 - 12.5 fL Final  . Neutro Abs 01/27/2017 4880  1,500 - 7,800 cells/uL Final  . Lymphs Abs 01/27/2017 1680  850 - 3,900 cells/uL Final  . Monocytes Absolute 01/27/2017 1120* 200 - 950 cells/uL Final  . Eosinophils Absolute 01/27/2017  320  15 - 500 cells/uL Final  . Basophils Absolute 01/27/2017 0  0 - 200 cells/uL Final  . Neutrophils Relative % 01/27/2017 61  % Final  . Lymphocytes Relative 01/27/2017 21  % Final  . Monocytes Relative 01/27/2017 14  % Final  . Eosinophils Relative 01/27/2017 4  % Final  . Basophils Relative 01/27/2017 0  % Final  . Smear Review 01/27/2017 Criteria for review not met   Final     Imaging: No results found.  Speciality Comments: No specialty comments available.    Procedures:  No procedures performed Allergies: Patient has no known allergies.   Assessment / Plan:     Visit Diagnoses:  Rheumatoid arthritis of multiple sites with negative rheumatoid factor (HCC)  High risk medication use - d/c Humira due to prostate CA / Arava   History of prostate cancer  History of renal insufficiency syndrome    Orders: No orders of the defined types were placed in this encounter.  No orders of the defined types were placed in this encounter.   Face-to-face time spent with patient was *** minutes. 50% of time was spent in counseling and coordination of care.  Follow-Up Instructions: No Follow-up on file.   Rees Matura, RT  Note - This record has been created using [employer].  Chart creation errors have been sought, but may not always  have been located. Such creation errors do not reflect on  the standard of medical care.

## 2017-01-28 NOTE — Telephone Encounter (Signed)
Last Visit: 09/13/16 Next Visit: 02/05/17 Labs: 01/27/17 Creat 1.27 GFR 56  Okay to refill Arava?

## 2017-01-30 ENCOUNTER — Ambulatory Visit (INDEPENDENT_AMBULATORY_CARE_PROVIDER_SITE_OTHER): Payer: Self-pay

## 2017-01-30 ENCOUNTER — Ambulatory Visit (INDEPENDENT_AMBULATORY_CARE_PROVIDER_SITE_OTHER): Payer: Medicare HMO | Admitting: Physical Medicine and Rehabilitation

## 2017-01-30 DIAGNOSIS — M5416 Radiculopathy, lumbar region: Secondary | ICD-10-CM | POA: Diagnosis not present

## 2017-01-30 MED ORDER — LIDOCAINE HCL (PF) 1 % IJ SOLN: 0.3300 mL | Freq: Once | INTRAMUSCULAR | Status: DC

## 2017-01-30 MED ORDER — METHYLPREDNISOLONE ACETATE 80 MG/ML IJ SUSP
80.0000 mg | Freq: Once | INTRAMUSCULAR | Status: AC
  Administered 2017-01-30: 80 mg

## 2017-01-30 NOTE — Telephone Encounter (Signed)
See rx request. 

## 2017-01-30 NOTE — Patient Instructions (Signed)

## 2017-01-30 NOTE — Progress Notes (Addendum)
Jacob Farmer - 71 y.o. male MRN [id number]  Date of birth: Apr 06, 1946  Office Visit Note: Visit Date: 01/30/2017 PCP: Glo Herring., MD Referred by: Redmond School, MD  Subjective: Chief Complaint  Patient presents with  . Lower Back - Pain   HPI: Jacob Farmer is a 71 year old gentleman with complaints of chronic worsening severe left side lower back pain. Feels tightness down left leg to knee. Denies numbness and tingling. Worse at night. Can't get comfortable. Wife stays he is up and down all night. Dr. Louanne Skye has been following her and request diagnostic and hopefully therapeutic left L4 transforaminal epidural steroid injection.      ROS Otherwise per HPI.  Assessment & Plan: Visit Diagnoses:  1. Lumbar radiculopathy     Plan: Findings:  Left L4 transforaminal epidural steroid injection. Patient also asking about physical therapy which I think would be a good idea to regroup with that. [place] and [facility] [place] [facility] a good choice there. There is also a [facility] there as well.    Meds & Orders:  Meds ordered this encounter  Medications  . lidocaine (PF) (XYLOCAINE) 1 % injection 0.3 mL  . methylPREDNISolone acetate (DEPO-MEDROL) injection 80 mg    Orders Placed This Encounter  Procedures  . XR C-ARM NO REPORT  . Epidural Steroid injection    Follow-up: Return for Scheduled follow-up with Dr. Louanne Skye.   Procedures: No procedures performed  Lumbosacral Transforaminal Epidural Steroid Injection - Sub-Pedicular Approach with Fluoroscopic Guidance  Patient: Jacob Farmer      Date of Birth: 1946/08/25 MRN: [id number] PCP: Glo Herring., MD      Visit Date: 01/30/2017   Universal Protocol:    Date/Time: 02/16/182:49 PM  Consent Given By: the patient  Position: prone  Additional Comments: Vital signs were monitored before and after the procedure. Patient was prepped and draped in the usual sterile fashion. The  correct patient, procedure, and site was verified.   Injection Procedure Details:  Procedure Site One Meds Administered:  Meds ordered this encounter  Medications  . lidocaine (PF) (XYLOCAINE) 1 % injection 0.3 mL  . methylPREDNISolone acetate (DEPO-MEDROL) injection 80 mg    Laterality: Left  Location/Site:  L4-L5  Needle size: 22 G  Needle type: Spinal  Needle Placement: Transforaminal  Findings:  -Contrast Used: 1 mL iohexol 180 mg iodine/mL   -Comments: Excellent flow of contrast along the nerve and into the epidural space.  Procedure Details: After squaring off the end-plates to get a true AP view, the C-arm was positioned so that an oblique view of the foramen as noted above was visualized. The target area is just inferior to the "nose of the scotty dog" or sub pedicular. The soft tissues overlying this structure were infiltrated with 2-3 ml. of 1% Lidocaine without Epinephrine.  The spinal needle was inserted toward the target using a "trajectory" view along the fluoroscope beam.  Under AP and lateral visualization, the needle was advanced so it did not puncture dura and was located close the 6 O'Clock position of the pedical in AP tracterory. Biplanar projections were used to confirm position. Aspiration was confirmed to be negative for CSF and/or blood. A 1-2 ml. volume of Isovue-250 was injected and flow of contrast was noted at each level. Radiographs were obtained for documentation purposes.   After attaining the desired flow of contrast documented above, a 0.5 to 1.0 ml test dose of 0.25% Marcaine was injected into each  respective transforaminal space.  The patient was observed for 90 seconds post injection.  After no sensory deficits were reported, and normal lower extremity motor function was noted,   the above injectate was administered so that equal amounts of the injectate were placed at each foramen (level) into the transforaminal epidural space.   Additional  Comments:  The patient tolerated the procedure well No complications occurred Dressing: Band-Aid    Post-procedure details: Patient was observed during the procedure. Post-procedure instructions were reviewed.  Patient left the clinic in stable condition.       Clinical History: L-spine MRI dated 10/17/2016  L3-4: Annular bulging, small RIGHT subarticular to extra foraminal disc protrusion. Mild facet arthropathy and ligamentum flavum redundancy without canal stenosis. Mild RIGHT neural foraminal narrowing.  L4-5: Small broad-based disc bulge, LEFT subarticular annular fissure. Moderate RIGHT mild LEFT facet arthropathy and ligamentum flavum redundancy without canal stenosis. Minimal RIGHT, mild LEFT neural foraminal narrowing.  L5-S1: Moderate broad-based disc bulge, LEFT central annular fissure. Mild facet arthropathy. No canal stenosis. Moderate to severe RIGHT, severe LEFT neural foraminal narrowing.  IMPRESSION: Degenerative change of the lumbar spine.  No canal stenosis.  Neural foraminal narrowing L3-4 through L5-S1: Severe on the LEFT at L5-S1.  He reports that he quit smoking about 27 years ago. His smoking use included Cigarettes. He has a 30.00 pack-year smoking history. He has never used smokeless tobacco. No results for input(s): HGBA1C, LABURIC in the last 8760 hours.  Objective:  VS:  HT:    WT:   BMI:     BP:   HR: bpm  TEMP: ( )  RESP:  Physical Exam  Musculoskeletal:  Patient ambulates with somewhat of an antalgic gait to the left. He has concordant pain with extension rotation to the left with a negative slump test and good strength.    Ortho Exam Imaging: Xr C-arm No Report  Result Date: 01/30/2017 Please see Notes or Procedures tab for imaging impression.   Past Medical/Family/Surgical/Social History: Medications & Allergies reviewed per EMR Patient Active Problem List   Diagnosis Date Noted  . High risk medication use 01/28/2017    . History of prostate cancer 01/28/2017  . History of renal insufficiency syndrome 01/28/2017  . Malignant neoplasm of prostate ([place]) 11/17/2015  . Chronic kidney disease, stage 3, mod decreased GFR 12/27/2011  . Diabetes mellitus, type II ([place]) 12/27/2011  . Rheumatoid arthritis ([place]) 01/04/2011  . SINUS BRADYCARDIA 01/12/2010  . 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 HYPERLIPIDEMIA 01/08/2010  . Hypertension 01/08/2010   Past Medical History:  Diagnosis Date  . Arteriosclerotic cardiovascular disease (ASCVD) cardiologist-  dr Roderic Palau branch   Acute myocardial infarction treated with TPA in 1990; 1999-stents to circumflex and RCA; residual total occlusion of the left anterior descending; normal ejection fraction; stress nuclear in 2001-distal anteroseptal ischemia; normal LV function  . Chronic kidney disease, stage 3, mod decreased GFR nephrologist-  at [facility] in [place]   Creatinine-2.18 and 12/2007, 1.76 and 12/2008  . First degree heart block   . History of gastric ulcer   . History of MI (myocardial infarction)    1990-  treated w/ TPA  . Hyperlipidemia   . Hypertension   . OA (osteoarthritis)   . Prediabetes   . Prostate cancer [facility]) urologist-- dr dahlstedt/  oncologist- dr Tammi Klippel   Stage T1c , Gleason 3+4,  PSA 4.7,  vol 24cc--  scheduled for radiative seed implants  . Psoriatic arthritis ([place])   . RA (  rheumatoid arthritis) [facility])    rheumologist-  dr Toni Amend  . S/P drug eluting coronary stent placement    1999--  DES x2 to CFX and RCA  . Sinus bradycardia   . Wears dentures    upper and lower partial   Family History  Problem Relation Age of Onset  . Heart attack Mother   . Coronary artery disease Father    Past Surgical History:  Procedure Laterality Date  . CARDIOVASCULAR STRESS TEST  2001  per Dr Lattie Haw clinic note   distal anteroseptal ischemia,  normal LVF  . COLONOSCOPY  last one 2006 (approx)  . CORONARY ANGIOPLASTY  WITH STENT PLACEMENT  1999   DES x2  to CFX and RCA/  residual total occlusion LAD,  normal LVEF  . RADIOACTIVE SEED IMPLANT N/A 01/11/2016   Procedure: RADIOACTIVE SEED IMPLANT/BRACHYTHERAPY IMPLANT;  Surgeon: Franchot Gallo, MD;  Location: [facility];  Service: Urology;  Laterality: N/A;   71  seeds implanted no seeds founds in bladder   Social History   Occupational History  . Retired Clinical biochemist    Social History Main Topics  . Smoking status: Former Smoker    Packs/day: 1.00    Years: 30.00    Types: Cigarettes    Quit date: 05/21/1989  . Smokeless tobacco: Never Used  . Alcohol use 21.0 oz/week    14 Shots of liquor, 21 Standard drinks or equivalent per week     Comment: 2-3 oz per day  . Drug use: No  . Sexual activity: Not on file

## 2017-01-31 NOTE — Procedures (Signed)
Lumbosacral Transforaminal Epidural Steroid Injection - Sub-Pedicular Approach with Fluoroscopic Guidance  Patient: Jacob Farmer      Date of Birth: 11/27/1946 MRN: [id number] PCP: Glo Herring., MD      Visit Date: 01/30/2017   Universal Protocol:    Date/Time: 02/16/182:49 PM  Consent Given By: the patient  Position: prone  Additional Comments: Vital signs were monitored before and after the procedure. Patient was prepped and draped in the usual sterile fashion. The correct patient, procedure, and site was verified.   Injection Procedure Details:  Procedure Site One Meds Administered:  Meds ordered this encounter  Medications  . lidocaine (PF) (XYLOCAINE) 1 % injection 0.3 mL  . methylPREDNISolone acetate (DEPO-MEDROL) injection 80 mg    Laterality: Left  Location/Site:  L4-L5  Needle size: 22 G  Needle type: Spinal  Needle Placement: Transforaminal  Findings:  -Contrast Used: 1 mL iohexol 180 mg iodine/mL   -Comments: Excellent flow of contrast along the nerve and into the epidural space.  Procedure Details: After squaring off the end-plates to get a true AP view, the C-arm was positioned so that an oblique view of the foramen as noted above was visualized. The target area is just inferior to the "nose of the scotty dog" or sub pedicular. The soft tissues overlying this structure were infiltrated with 2-3 ml. of 1% Lidocaine without Epinephrine.  The spinal needle was inserted toward the target using a "trajectory" view along the fluoroscope beam.  Under AP and lateral visualization, the needle was advanced so it did not puncture dura and was located close the 6 O'Clock position of the pedical in AP tracterory. Biplanar projections were used to confirm position. Aspiration was confirmed to be negative for CSF and/or blood. A 1-2 ml. volume of Isovue-250 was injected and flow of contrast was noted at each level. Radiographs were obtained for documentation  purposes.   After attaining the desired flow of contrast documented above, a 0.5 to 1.0 ml test dose of 0.25% Marcaine was injected into each respective transforaminal space.  The patient was observed for 90 seconds post injection.  After no sensory deficits were reported, and normal lower extremity motor function was noted,   the above injectate was administered so that equal amounts of the injectate were placed at each foramen (level) into the transforaminal epidural space.   Additional Comments:  The patient tolerated the procedure well No complications occurred Dressing: Band-Aid    Post-procedure details: Patient was observed during the procedure. Post-procedure instructions were reviewed.  Patient left the clinic in stable condition.

## 2017-02-04 DIAGNOSIS — R809 Proteinuria, unspecified: Secondary | ICD-10-CM | POA: Diagnosis not present

## 2017-02-04 DIAGNOSIS — M19041 Primary osteoarthritis, right hand: Secondary | ICD-10-CM | POA: Insufficient documentation

## 2017-02-04 DIAGNOSIS — M5136 Other intervertebral disc degeneration, lumbar region: Secondary | ICD-10-CM | POA: Insufficient documentation

## 2017-02-04 DIAGNOSIS — D509 Iron deficiency anemia, unspecified: Secondary | ICD-10-CM | POA: Diagnosis not present

## 2017-02-04 DIAGNOSIS — Z79899 Other long term (current) drug therapy: Secondary | ICD-10-CM | POA: Diagnosis not present

## 2017-02-04 DIAGNOSIS — N183 Chronic kidney disease, stage 3 (moderate): Secondary | ICD-10-CM | POA: Diagnosis not present

## 2017-02-04 DIAGNOSIS — M519 Unspecified thoracic, thoracolumbar and lumbosacral intervertebral disc disorder: Secondary | ICD-10-CM | POA: Insufficient documentation

## 2017-02-04 DIAGNOSIS — I10 Essential (primary) hypertension: Secondary | ICD-10-CM | POA: Diagnosis not present

## 2017-02-04 DIAGNOSIS — M19042 Primary osteoarthritis, left hand: Secondary | ICD-10-CM | POA: Insufficient documentation

## 2017-02-04 DIAGNOSIS — M25562 Pain in left knee: Secondary | ICD-10-CM | POA: Insufficient documentation

## 2017-02-04 DIAGNOSIS — Z8739 Personal history of other diseases of the musculoskeletal system and connective tissue: Secondary | ICD-10-CM | POA: Insufficient documentation

## 2017-02-04 DIAGNOSIS — E559 Vitamin D deficiency, unspecified: Secondary | ICD-10-CM | POA: Diagnosis not present

## 2017-02-04 NOTE — Progress Notes (Signed)
Office Visit Note  Patient: Jacob Farmer             Date of Birth: [birth year]           MRN: [id number]             PCP: Glo Herring., MD Referring: Redmond School, MD Visit Date: 02/05/2017 Occupation: _0 @    Subjective:  Follow-up RA, Arava, low back pain, gout  History of Present Illness: Jacob Farmer is a 71 y.o. male   last seen 09/13/2016. Patient is doing really well with RA, gout, low back pain, OA of the hands. He describes his pain as a 2 on a scale of 0-10.  His ARAVA is working really well for his RA.  Patient's gout is really well controlled with proper diet, hydration, allopurinol 100 mg daily. He needs a refill on both are for and allopurinol.  For his low back pain we referred him to Dr. Louanne Skye and Dr. Ernestina Patches and had excellent results with their treatment. Patient's back is feeling much better. He is also lost few pounds since his last visit. I've encouraged him to consider ongoing activities and proper diet to continue his weight loss success. Patient is agreeable. He will exercise and be active as tolerated.    Activities of Daily Living:  Patient reports morning stiffness for 15 minutes.   Patient Denies nocturnal pain.  Difficulty dressing/grooming: Denies Difficulty climbing stairs: Denies Difficulty getting out of chair: Denies Difficulty using hands for taps, buttons, cutlery, and/or writing: Denies   Review of Systems  Constitutional: Negative for fatigue.  HENT: Negative for mouth sores and mouth dryness.   Eyes: Negative for dryness.  Respiratory: Negative for shortness of breath.   Gastrointestinal: Negative for constipation and diarrhea.  Musculoskeletal: Negative for myalgias and myalgias.  Skin: Negative for sensitivity to sunlight.  Neurological: Negative for memory loss.  Psychiatric/Behavioral: Negative for sleep disturbance.    PMFS History:  Patient Active Problem List   Diagnosis Date Noted  . Lumbar disc  disease 02/04/2017  . Primary osteoarthritis of both hands 02/04/2017  . History of gout 02/04/2017  . DDD (degenerative disc disease), lumbar 02/04/2017  . Pain in joint of left knee 02/04/2017  . High risk medication use 01/28/2017  . History of prostate cancer 01/28/2017  . History of renal insufficiency syndrome 01/28/2017  . Malignant neoplasm of prostate ([place]) 11/17/2015  . Chronic kidney disease, stage 3, mod decreased GFR 12/27/2011  . Diabetes mellitus, type II ([place]) 12/27/2011  . Rheumatoid arthritis ([place]) 01/04/2011  . SINUS BRADYCARDIA 01/12/2010  . 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 HYPERLIPIDEMIA 01/08/2010  . Hypertension 01/08/2010    Past Medical History:  Diagnosis Date  . Arteriosclerotic cardiovascular disease (ASCVD) cardiologist-  dr Roderic Palau branch   Acute myocardial infarction treated with TPA in 1990; 1999-stents to circumflex and RCA; residual total occlusion of the left anterior descending; normal ejection fraction; stress nuclear in 2001-distal anteroseptal ischemia; normal LV function  . Chronic kidney disease, stage 3, mod decreased GFR nephrologist-  at [facility] in [place]   Creatinine-2.18 and 12/2007, 1.76 and 12/2008  . First degree heart block   . History of gastric ulcer   . History of MI (myocardial infarction)    1990-  treated w/ TPA  . Hyperlipidemia   . Hypertension   . OA (osteoarthritis)   . Prediabetes   . Prostate cancer [facility]) urologist-- dr dahlstedt/  oncologist- dr Tammi Klippel   Stage T1c ,  Gleason 3+4,  PSA 4.7,  vol 24cc--  scheduled for radiative seed implants  . Psoriatic arthritis ([place])   . RA (rheumatoid arthritis) ([place])    rheumologist-  dr Toni Amend  . S/P drug eluting coronary stent placement    1999--  DES x2 to CFX and RCA  . Sinus bradycardia   . Wears dentures    upper and lower partial    Family History  Problem Relation Age of Onset  . Heart attack Mother   . Coronary artery  disease Father    Past Surgical History:  Procedure Laterality Date  . CARDIOVASCULAR STRESS TEST  2001  per Dr Lattie Haw clinic note   distal anteroseptal ischemia,  normal LVF  . COLONOSCOPY  last one 2006 (approx)  . CORONARY ANGIOPLASTY WITH STENT PLACEMENT  1999   DES x2  to CFX and RCA/  residual total occlusion LAD,  normal LVEF  . RADIOACTIVE SEED IMPLANT N/A 01/11/2016   Procedure: RADIOACTIVE SEED IMPLANT/BRACHYTHERAPY IMPLANT;  Surgeon: Franchot Gallo, MD;  Location: [facility];  Service: Urology;  Laterality: N/A;   71  seeds implanted no seeds founds in bladder   Social History   Social History Narrative   No regular exercise     Objective: Vital Signs: BP 122/75   Pulse 60   Resp 12   Ht _0  (1.778 m)   Wt 192 lb (87.1 kg)   BMI 27.55 kg/m    Physical Exam  Constitutional: He is oriented to person, place, and time. He appears well-developed and well-nourished.  HENT:  Head: Normocephalic and atraumatic.  Eyes: Conjunctivae and EOM are normal. Pupils are equal, round, and reactive to light.  Neck: Normal range of motion. Neck supple.  Cardiovascular: Normal rate, regular rhythm and normal heart sounds.  Exam reveals no gallop and no friction rub.   No murmur heard. Pulmonary/Chest: Effort normal and breath sounds normal. No respiratory distress. He has no wheezes. He has no rales. He exhibits no tenderness.  Abdominal: Soft. He exhibits no distension and no mass. There is no tenderness. There is no guarding.  Musculoskeletal: Normal range of motion.  Lymphadenopathy:    He has no cervical adenopathy.  Neurological: He is alert and oriented to person, place, and time. He exhibits normal muscle tone. Coordination normal.  Skin: Skin is warm and dry. Capillary refill takes less than 2 seconds. No rash noted.  Psychiatric: He has a normal mood and affect. His behavior is normal. Judgment and thought content normal.  Nursing note and vitals  reviewed.    Musculoskeletal Exam:  Full range of motion of all joints Grip strength is equal and strong bilaterally Fibromyalgia tender points are absent  CDAI Exam: CDAI Homunculus Exam:   Joint Counts:  CDAI Tender Joint count: 0 CDAI Swollen Joint count: 0  Global Assessments:  Patient Global Assessment: 2 Provider Global Assessment: 2  CDAI Calculated Score: 4    Investigation: Findings:  August 2017:  CBC was normal.  Comprehensive metabolic panel showed glucose of 147, creatinine 1.25, GFR 57.  Uric acid 5.   09/13/2016,X-ray of left knee joint, 2 views today, showed normal joint space and mild patellofemoral narrowing consistent with mild chondromalacia patella.   03/23/2015-TB Gold Neg.  Orders Only on 01/27/2017  Component Date Value Ref Range Status  . Sodium 01/27/2017 143  135 - 146 mmol/L Final  . Potassium 01/27/2017 5.0  3.5 - 5.3 mmol/L Final  . Chloride 01/27/2017 107  98 -  110 mmol/L Final  . CO2 01/27/2017 30  20 - 31 mmol/L Final  . Glucose, Bld 01/27/2017 84  65 - 99 mg/dL Final  . BUN 01/27/2017 17  7 - 25 mg/dL Final  . Creat 01/27/2017 1.27* 0.70 - 1.18 mg/dL Final   Comment:   For patients > or = 73 years of age: The upper reference limit for Creatinine is approximately 13% higher for people identified as African-American.     . Total Bilirubin 01/27/2017 0.4  0.2 - 1.2 mg/dL Final  . Alkaline Phosphatase 01/27/2017 109  40 - 115 U/L Final  . AST 01/27/2017 19  10 - 35 U/L Final  . ALT 01/27/2017 24  9 - 46 U/L Final  . Total Protein 01/27/2017 6.6  6.1 - 8.1 g/dL Final  . Albumin 01/27/2017 3.8  3.6 - 5.1 g/dL Final  . Calcium 01/27/2017 9.4  8.6 - 10.3 mg/dL Final  . GFR, Est African American 01/27/2017 65  >=60 mL/min Final  . GFR, Est Non African American 01/27/2017 56* >=60 mL/min Final  . WBC 01/27/2017 8.0  3.8 - 10.8 K/uL Final  . RBC 01/27/2017 5.02  4.20 - 5.80 MIL/uL Final  . Hemoglobin 01/27/2017 14.5  13.2 - 17.1 g/dL Final    . HCT 01/27/2017 44.4  38.5 - 50.0 % Final  . MCV 01/27/2017 88.4  80.0 - 100.0 fL Final  . MCH 01/27/2017 28.9  27.0 - 33.0 pg Final  . MCHC 01/27/2017 32.7  32.0 - 36.0 g/dL Final  . RDW 01/27/2017 13.7  11.0 - 15.0 % Final  . Platelets 01/27/2017 188  140 - 400 K/uL Final  . MPV 01/27/2017 11.5  7.5 - 12.5 fL Final  . Neutro Abs 01/27/2017 4880  1,500 - 7,800 cells/uL Final  . Lymphs Abs 01/27/2017 1680  850 - 3,900 cells/uL Final  . Monocytes Absolute 01/27/2017 1120* 200 - 950 cells/uL Final  . Eosinophils Absolute 01/27/2017 320  15 - 500 cells/uL Final  . Basophils Absolute 01/27/2017 0  0 - 200 cells/uL Final  . Neutrophils Relative % 01/27/2017 61  % Final  . Lymphocytes Relative 01/27/2017 21  % Final  . Monocytes Relative 01/27/2017 14  % Final  . Eosinophils Relative 01/27/2017 4  % Final  . Basophils Relative 01/27/2017 0  % Final  . Smear Review 01/27/2017 Criteria for review not met   Final  Orders Only on 11/25/2016  Component Date Value Ref Range Status  . Sodium 11/25/2016 141  135 - 146 mmol/L Final  . Potassium 11/25/2016 5.0  3.5 - 5.3 mmol/L Final  . Chloride 11/25/2016 104  98 - 110 mmol/L Final  . CO2 11/25/2016 27  20 - 31 mmol/L Final  . Glucose, Bld 11/25/2016 133* 65 - 99 mg/dL Final  . BUN 11/25/2016 20  7 - 25 mg/dL Final  . Creat 11/25/2016 1.19* 0.70 - 1.18 mg/dL Final   Comment:   For patients > or = 73 years of age: The upper reference limit for Creatinine is approximately 13% higher for people identified as African-American.     . Total Bilirubin 11/25/2016 0.5  0.2 - 1.2 mg/dL Final  . Alkaline Phosphatase 11/25/2016 92  40 - 115 U/L Final  . AST 11/25/2016 18  10 - 35 U/L Final  . ALT 11/25/2016 25  9 - 46 U/L Final  . Total Protein 11/25/2016 6.4  6.1 - 8.1 g/dL Final  . Albumin 11/25/2016 3.6  3.6 - 5.1  g/dL Final  . Calcium 11/25/2016 9.3  8.6 - 10.3 mg/dL Final  . GFR, Est African American 11/25/2016 70  >=60 mL/min Final  . GFR, Est  Non African American 11/25/2016 61  >=60 mL/min Final  . WBC 11/25/2016 8.8  3.8 - 10.8 K/uL Final  . RBC 11/25/2016 4.68  4.20 - 5.80 MIL/uL Final  . Hemoglobin 11/25/2016 13.8  13.2 - 17.1 g/dL Final  . HCT 11/25/2016 41.7  38.5 - 50.0 % Final  . MCV 11/25/2016 89.1  80.0 - 100.0 fL Final  . MCH 11/25/2016 29.5  27.0 - 33.0 pg Final  . MCHC 11/25/2016 33.1  32.0 - 36.0 g/dL Final  . RDW 11/25/2016 14.0  11.0 - 15.0 % Final  . Platelets 11/25/2016 163  140 - 400 K/uL Final  . MPV 11/25/2016 11.6  7.5 - 12.5 fL Final  . Neutro Abs 11/25/2016 6512  1,500 - 7,800 cells/uL Final  . Lymphs Abs 11/25/2016 880  850 - 3,900 cells/uL Final  . Monocytes Absolute 11/25/2016 1232* 200 - 950 cells/uL Final  . Eosinophils Absolute 11/25/2016 176  15 - 500 cells/uL Final  . Basophils Absolute 11/25/2016 0  0 - 200 cells/uL Final  . Neutrophils Relative % 11/25/2016 74  % Final  . Lymphocytes Relative 11/25/2016 10  % Final  . Monocytes Relative 11/25/2016 14  % Final  . Eosinophils Relative 11/25/2016 2  % Final  . Basophils Relative 11/25/2016 0  % Final  . Smear Review 11/25/2016 Criteria for review not met   Final      Imaging: Xr C-arm No Report  Result Date: 01/30/2017 Please see Notes or Procedures tab for imaging impression.   Speciality Comments: No specialty comments available.    Procedures:  No procedures performed Allergies: Patient has no known allergies.   Assessment / Plan:     Visit Diagnoses: Rheumatoid arthritis of multiple sites with negative rheumatoid factor ([place])  High risk medication use - Arava - Plan: CBC with Differential/Platelet, COMPLETE METABOLIC PANEL WITH GFR, Uric acid  Primary osteoarthritis of both hands  DDD (degenerative disc disease), lumbar  History of gout  Pain in joint of left knee   Plan: #1: Rheumatoid arthritis. No synovitis on examination. Today No joint pain swelling and stiffness. Doing well with a rate of a 10 mg every  morning. No complaint.  #2: High risk prescription. Arava 10 mg daily. Ninety-day supply was given and no refills and patient will call us when he needs a refill  #3: Gout. No flare. Doing well. Needs a refill on allopurinol 100 mg every morning. I gave him a 90 day supply with 1 refill.  #4: History of chronic kidney disease. Sees a nephrologist. His labs are stable. His current creatinine is slightly elevated and a GFR slightly low. It's very consistent with the past creatinine and GFR.  #5: Uric acid was 5.0 back on 08/14/2016 labs. We do not have any updated uric acid at this time. Agent has not had any flare but I would like to recheck his uric acid at the next labs that he will have drawn in about 3 months. He will go to [place] and get the labs done then. I will order CBC with differential, CMP with GFR, uric acid at the next lab  #6: Patient is having low back pain. He saw Dr. Louanne Skye,, Dr. Ernestina Patches. He had excellent relief after their intervention. Patient is very pleased with his improved back. Currently he is  getting injections in his back and the first injection proved to be very effective. He states "Dr. Louanne Skye said that my back is not too bad on MRI and does not recommend surgery.". He was grateful that the surgeon wanted to hold off on surgery and try conservative methods first.    Orders: Orders Placed This Encounter  Procedures  . CBC with Differential/Platelet  . COMPLETE METABOLIC PANEL WITH GFR  . Uric acid   Meds ordered this encounter  Medications  . allopurinol (ZYLOPRIM) 100 MG tablet    Sig: Take 1 tablet (100 mg total) by mouth daily.    Dispense:  90 tablet    Refill:  1  . leflunomide (ARAVA) 10 MG tablet    Sig: Take 1 tablet (10 mg total) by mouth daily.    Dispense:  90 tablet    Refill:  0    Face-to-face time spent with patient was 30 minutes. 50% of time was spent in counseling and coordination of care.  Follow-Up  Instructions: Return in about 5 months (around 07/05/2017).   Eliezer Lofts, PA-C  Note - This record has been created using [employer].  Chart creation errors have been sought, but may not always  have been located. Such creation errors do not reflect on  the standard of medical care.

## 2017-02-05 ENCOUNTER — Ambulatory Visit (INDEPENDENT_AMBULATORY_CARE_PROVIDER_SITE_OTHER): Payer: Medicare HMO | Admitting: Rheumatology

## 2017-02-05 ENCOUNTER — Encounter: Payer: Self-pay | Admitting: Rheumatology

## 2017-02-05 ENCOUNTER — Ambulatory Visit: Payer: Commercial Managed Care - HMO | Admitting: Rheumatology

## 2017-02-05 VITALS — BP 122/75 | HR 60 | Resp 12 | Ht 70.0 in | Wt 192.0 lb

## 2017-02-05 DIAGNOSIS — M19042 Primary osteoarthritis, left hand: Secondary | ICD-10-CM | POA: Diagnosis not present

## 2017-02-05 DIAGNOSIS — M5136 Other intervertebral disc degeneration, lumbar region: Secondary | ICD-10-CM | POA: Diagnosis not present

## 2017-02-05 DIAGNOSIS — Z8739 Personal history of other diseases of the musculoskeletal system and connective tissue: Secondary | ICD-10-CM

## 2017-02-05 DIAGNOSIS — M19041 Primary osteoarthritis, right hand: Secondary | ICD-10-CM | POA: Diagnosis not present

## 2017-02-05 DIAGNOSIS — Z79899 Other long term (current) drug therapy: Secondary | ICD-10-CM | POA: Diagnosis not present

## 2017-02-05 DIAGNOSIS — M25562 Pain in left knee: Secondary | ICD-10-CM | POA: Diagnosis not present

## 2017-02-05 DIAGNOSIS — M0609 Rheumatoid arthritis without rheumatoid factor, multiple sites: Secondary | ICD-10-CM

## 2017-02-05 MED ORDER — ALLOPURINOL 100 MG PO TABS: 100.0000 mg | ORAL_TABLET | Freq: Every day | ORAL | 1 refills | Status: DC

## 2017-02-05 MED ORDER — LEFLUNOMIDE 10 MG PO TABS: 10.0000 mg | ORAL_TABLET | Freq: Every day | ORAL | 0 refills | Status: DC

## 2017-02-05 MED ORDER — COLCHICINE 0.6 MG PO CAPS: 0.6000 mg | ORAL_CAPSULE | ORAL | 0 refills | Status: DC

## 2017-02-12 DIAGNOSIS — N183 Chronic kidney disease, stage 3 (moderate): Secondary | ICD-10-CM | POA: Diagnosis not present

## 2017-02-12 DIAGNOSIS — E1129 Type 2 diabetes mellitus with other diabetic kidney complication: Secondary | ICD-10-CM | POA: Diagnosis not present

## 2017-02-12 DIAGNOSIS — E875 Hyperkalemia: Secondary | ICD-10-CM | POA: Diagnosis not present

## 2017-02-12 DIAGNOSIS — R809 Proteinuria, unspecified: Secondary | ICD-10-CM | POA: Diagnosis not present

## 2017-02-12 DIAGNOSIS — I10 Essential (primary) hypertension: Secondary | ICD-10-CM | POA: Diagnosis not present

## 2017-02-12 DIAGNOSIS — N250 Renal osteodystrophy: Secondary | ICD-10-CM | POA: Diagnosis not present

## 2017-02-27 ENCOUNTER — Ambulatory Visit (INDEPENDENT_AMBULATORY_CARE_PROVIDER_SITE_OTHER): Payer: Medicare HMO | Admitting: Specialist

## 2017-02-27 ENCOUNTER — Encounter (INDEPENDENT_AMBULATORY_CARE_PROVIDER_SITE_OTHER): Payer: Self-pay | Admitting: Specialist

## 2017-02-27 VITALS — BP 132/74 | HR 61 | Ht 70.0 in | Wt 198.0 lb

## 2017-02-27 DIAGNOSIS — E119 Type 2 diabetes mellitus without complications: Secondary | ICD-10-CM | POA: Diagnosis not present

## 2017-02-27 DIAGNOSIS — Z6828 Body mass index (BMI) 28.0-28.9, adult: Secondary | ICD-10-CM | POA: Diagnosis not present

## 2017-02-27 DIAGNOSIS — R1011 Right upper quadrant pain: Secondary | ICD-10-CM

## 2017-02-27 DIAGNOSIS — Z1389 Encounter for screening for other disorder: Secondary | ICD-10-CM | POA: Diagnosis not present

## 2017-02-27 DIAGNOSIS — R109 Unspecified abdominal pain: Secondary | ICD-10-CM | POA: Diagnosis not present

## 2017-02-27 DIAGNOSIS — N342 Other urethritis: Secondary | ICD-10-CM | POA: Diagnosis not present

## 2017-02-27 DIAGNOSIS — M069 Rheumatoid arthritis, unspecified: Secondary | ICD-10-CM | POA: Diagnosis not present

## 2017-02-27 NOTE — Progress Notes (Signed)
Office Visit Note   Patient: Jacob Farmer           Date of Birth: 1946/08/06           MRN: [id number] Visit Date: 02/27/2017              Requested by: Redmond School, MD [address] [place], [place] [address] PCP: Glo Herring., MD   Assessment & Plan: Visit Diagnoses:  1. Right upper quadrant pain   2. Flank pain     Plan: With patient's current complaints of right upper quadrant pain and right flank pain I'm concerned that this may be from his gallbladder or urinary tract issue. I got patient appointment to see Dr. Nolon Rod nurse practitioner this afternoon. Advised patient that he may end up getting labs to check liver function and UA.  Also may need an ultrasound. States that he has an appointment with his urologist Dr. Diona Fanti April 11 and he may need earlier appointment depending upon what the outcome of his primary care visit is today.  Follow-up with Dr.nitka in 3 weeks for recheck.  Follow-Up Instructions: Return in about 3 weeks (around 03/20/2017).   Orders:  No orders of the defined types were placed in this encounter.  No orders of the defined types were placed in this encounter.     Procedures: No procedures performed   Clinical Data: No additional findings.   Subjective: Chief Complaint  Patient presents with  . Lower Back - Follow-up    [name] is here to follow up after Left L4 TF injection with Dr. Ernestina Patches on 01/30/2017.  He states that he got about 75 % relief with it and he would like to get one more if possible.   Patient states that after his injection he has had excellent relief of his left leg pain. Currently he is complaining of new pain over the last couple of weeks. Pain more in the right flank and also in the right upper quadrant. States that he has pain throughout the day but this is worse at times at night and has a hard time getting comfortable when in bed. Patient has been having ongoing problems with urinary retention.   He is scheduled to see his urologist in a few weeks. No hematuria. History of prostate cancer. Denies right lower extremity radicular pain. No complaints of fever chills.   Review of Systems  Constitutional: Negative.   Gastrointestinal: Positive for abdominal pain (Right upper quadrant). Negative for vomiting.  Genitourinary: Positive for decreased urine volume, dysuria and flank pain (Right).  Neurological: Negative.   Psychiatric/Behavioral: Negative.      Objective: Vital Signs: BP 132/74 (BP Location: Left Arm, Patient Position: Sitting)   Pulse 61   Ht 5\' 10"  (1.778 m)   Wt 198 lb (89.8 kg)   BMI 28.41 kg/m   Physical Exam  Constitutional: He is oriented to person, place, and time. No distress.  HENT:  Head: Normocephalic and atraumatic.  Eyes: Pupils are equal, round, and reactive to light.  Neck: Normal range of motion.  Pulmonary/Chest: No respiratory distress.  Abdominal: He exhibits no distension. There is tenderness (Right upper quadrant with deep palpation.). There is no rebound and no guarding.  Musculoskeletal:  No CVA tenderness. Sciatic notch nontender. Negative logroll bilateral hips. Negative straight leg raise. No focal motor deficits.  Neurological: He is alert and oriented to person, place, and time.  Skin: Skin is warm and dry.    Ortho Exam  Specialty  Comments:  No specialty comments available.  Imaging: No results found.   PMFS History: Patient Active Problem List   Diagnosis Date Noted  . Lumbar disc disease 02/04/2017  . Primary osteoarthritis of both hands 02/04/2017  . History of gout 02/04/2017  . DDD (degenerative disc disease), lumbar 02/04/2017  . Pain in joint of left knee 02/04/2017  . High risk medication use 01/28/2017  . History of prostate cancer 01/28/2017  . History of renal insufficiency syndrome 01/28/2017  . Malignant neoplasm of prostate ([place]) 11/17/2015  . Chronic kidney disease, stage 3, mod decreased GFR 12/27/2011   . Diabetes mellitus, type II ([place]) 12/27/2011  . Rheumatoid arthritis ([place]) 01/04/2011  . SINUS BRADYCARDIA 01/12/2010  . 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 HYPERLIPIDEMIA 01/08/2010  . Hypertension 01/08/2010   Past Medical History:  Diagnosis Date  . Arteriosclerotic cardiovascular disease (ASCVD) cardiologist-  dr Roderic Palau branch   Acute myocardial infarction treated with TPA in 1990; 1999-stents to circumflex and RCA; residual total occlusion of the left anterior descending; normal ejection fraction; stress nuclear in 2001-distal anteroseptal ischemia; normal LV function  . Chronic kidney disease, stage 3, mod decreased GFR nephrologist-  at [facility] in [place]   Creatinine-2.18 and 12/2007, 1.76 and 12/2008  . First degree heart block   . History of gastric ulcer   . History of MI (myocardial infarction)    1990-  treated w/ TPA  . Hyperlipidemia   . Hypertension   . OA (osteoarthritis)   . Prediabetes   . Prostate cancer [facility]) urologist-- dr dahlstedt/  oncologist- dr Tammi Klippel   Stage T1c , Gleason 3+4,  PSA 4.7,  vol 24cc--  scheduled for radiative seed implants  . Psoriatic arthritis ([place])   . RA (rheumatoid arthritis) ([place])    rheumologist-  dr Toni Amend  . S/P drug eluting coronary stent placement    1999--  DES x2 to CFX and RCA  . Sinus bradycardia   . Wears dentures    upper and lower partial    Family History  Problem Relation Age of Onset  . Heart attack Mother   . Coronary artery disease Father     Past Surgical History:  Procedure Laterality Date  . CARDIOVASCULAR STRESS TEST  2001  per Dr Lattie Haw clinic note   distal anteroseptal ischemia,  normal LVF  . COLONOSCOPY  last one 2006 (approx)  . CORONARY ANGIOPLASTY WITH STENT PLACEMENT  1999   DES x2  to CFX and RCA/  residual total occlusion LAD,  normal LVEF  . RADIOACTIVE SEED IMPLANT N/A 01/11/2016   Procedure: RADIOACTIVE SEED IMPLANT/BRACHYTHERAPY IMPLANT;  Surgeon:  Franchot Gallo, MD;  Location: [facility];  Service: Urology;  Laterality: N/A;   71  seeds implanted no seeds founds in bladder   Social History   Occupational History  . Retired Clinical biochemist    Social History Main Topics  . Smoking status: Former Smoker    Packs/day: 1.00    Years: 30.00    Types: Cigarettes    Quit date: 05/21/1989  . Smokeless tobacco: Never Used  . Alcohol use 21.0 oz/week    14 Shots of liquor, 21 Standard drinks or equivalent per week     Comment: 2-3 oz per day  . Drug use: No  . Sexual activity: Not on file

## 2017-02-27 NOTE — Patient Instructions (Signed)
You have appointment scheduled with Dr. Purcell Nails Fusco's nurse practitioner this afternoon at 3:30.

## 2017-03-17 DIAGNOSIS — C61 Malignant neoplasm of prostate: Secondary | ICD-10-CM | POA: Diagnosis not present

## 2017-03-18 ENCOUNTER — Ambulatory Visit (HOSPITAL_COMMUNITY)
Admission: RE | Admit: 2017-03-18 | Discharge: 2017-03-18 | Disposition: A | Discharge status: Home / Self Care | Payer: Medicare HMO | Source: Ambulatory Visit | Attending: Registered Nurse | Admitting: Registered Nurse

## 2017-03-18 ENCOUNTER — Other Ambulatory Visit (HOSPITAL_COMMUNITY): Payer: Self-pay | Admitting: Registered Nurse

## 2017-03-18 DIAGNOSIS — N200 Calculus of kidney: Secondary | ICD-10-CM | POA: Diagnosis not present

## 2017-03-18 DIAGNOSIS — R1031 Right lower quadrant pain: Secondary | ICD-10-CM | POA: Diagnosis not present

## 2017-03-18 DIAGNOSIS — R1011 Right upper quadrant pain: Secondary | ICD-10-CM | POA: Diagnosis not present

## 2017-03-18 DIAGNOSIS — R1901 Right upper quadrant abdominal swelling, mass and lump: Secondary | ICD-10-CM | POA: Diagnosis not present

## 2017-03-18 DIAGNOSIS — Z6829 Body mass index (BMI) 29.0-29.9, adult: Secondary | ICD-10-CM | POA: Diagnosis not present

## 2017-03-18 DIAGNOSIS — E663 Overweight: Secondary | ICD-10-CM | POA: Diagnosis not present

## 2017-03-26 ENCOUNTER — Ambulatory Visit (INDEPENDENT_AMBULATORY_CARE_PROVIDER_SITE_OTHER): Payer: Medicare HMO | Admitting: Urology

## 2017-03-26 DIAGNOSIS — C61 Malignant neoplasm of prostate: Secondary | ICD-10-CM

## 2017-03-26 DIAGNOSIS — N200 Calculus of kidney: Secondary | ICD-10-CM

## 2017-03-26 DIAGNOSIS — R351 Nocturia: Secondary | ICD-10-CM

## 2017-04-10 ENCOUNTER — Ambulatory Visit (INDEPENDENT_AMBULATORY_CARE_PROVIDER_SITE_OTHER): Payer: Medicare HMO

## 2017-04-10 ENCOUNTER — Ambulatory Visit (INDEPENDENT_AMBULATORY_CARE_PROVIDER_SITE_OTHER): Payer: Medicare HMO | Admitting: Specialist

## 2017-04-10 VITALS — BP 131/73 | HR 52 | Ht 70.0 in | Wt 194.0 lb

## 2017-04-10 DIAGNOSIS — M5412 Radiculopathy, cervical region: Secondary | ICD-10-CM | POA: Diagnosis not present

## 2017-04-10 DIAGNOSIS — M25511 Pain in right shoulder: Secondary | ICD-10-CM | POA: Diagnosis not present

## 2017-04-10 NOTE — Progress Notes (Signed)
Office Visit Note   Patient: Jacob Farmer           Date of Birth: [birth year]           MRN: [id number] Visit Date: 04/10/2017              Requested by: Redmond School, [address], [place] [address] PCP: Redmond School, MD   Assessment & Plan: Visit Diagnoses:  1. Acute pain of right shoulder   2. Radiculopathy, cervical region     Plan: With the cervical radicular pain the patient is describing I will schedule MRI cervical spine to rule out HNP/stenosis. Follow-up the office after completion to discuss results and further treatment options. Patient will continue muscle relaxer.  Follow-Up Instructions: Return in about 3 weeks (around 05/01/2017).   Orders:  Orders Placed This Encounter  Procedures  . XR Shoulder Right  . XR Cervical Spine 2 or 3 views  . MR Cervical Spine w/o contrast   No orders of the defined types were placed in this encounter.     Procedures: No procedures performed   Clinical Data: No additional findings.   Subjective: No chief complaint on file.   HPI Patient returns. States that L4-5 transforaminal injection has given some improvement.  Complaining today of neck and right shoulder pain. States the pain radiates to the right shoulder and some down his arm. No some tingling in the right arm as well. No symptoms on the left side. Shoulder pain also aggravated with overhead activity. Currently taking a muscle relaxer. Review of Systems  HENT: Negative.   Respiratory: Negative.   Cardiovascular: Negative.   Musculoskeletal: Positive for neck pain and neck stiffness.  Neurological: Positive for numbness.  Psychiatric/Behavioral: Negative.     Objective: Vital Signs: BP 131/73 (BP Location: Left Arm, Patient Position: Sitting, Cuff Size: Normal)   Pulse (!) 52   Ht 5\' 10"  (1.778 m)   Wt 194 lb (88 kg)   BMI 27.84 kg/m   Physical Exam  Constitutional: No distress.  HENT:  Head: Normocephalic and atraumatic.    Eyes: Pupils are equal, round, and reactive to light. EOM are normal.  Neck:  Place of some decreased service on range motion. Positive right brachial plexus trapezius tenderness.  Pulmonary/Chest: No respiratory distress.  Musculoskeletal:  Right shoulder good range of motion but with some discomfort. Positive impingement test.  Neurological: He is alert.  Skin: Skin is warm and dry.    Ortho Exam  Specialty Comments:  No specialty comments available.  Imaging: No results found.   PMFS History: Patient Active Problem List   Diagnosis Date Noted  . Herniated cervical disc 05/26/2017    Class: Acute  . Herniation of cervical intervertebral disc with radiculopathy 05/26/2017  . Cervical spinal stenosis 05/26/2017  . Lumbar disc disease 02/04/2017  . Primary osteoarthritis of both hands 02/04/2017  . History of gout 02/04/2017  . DDD (degenerative disc disease), lumbar 02/04/2017  . Pain in joint of left knee 02/04/2017  . High risk medication use 01/28/2017  . History of prostate cancer 01/28/2017  . History of renal insufficiency syndrome 01/28/2017  . Malignant neoplasm of prostate ([place]) 11/17/2015  . Chronic kidney disease, stage 3, mod decreased GFR 12/27/2011  . Diabetes mellitus, type II ([place]) 12/27/2011  . Rheumatoid arthritis ([place]) 01/04/2011  . SINUS BRADYCARDIA 01/12/2010  . 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 HYPERLIPIDEMIA 01/08/2010  . Hypertension 01/08/2010   Past Medical History:  Diagnosis  Date  . Arteriosclerotic cardiovascular disease (ASCVD) cardiologist-  dr Roderic [place] [facility]   Acute myocardial infarction treated with TPA in 1990; 1999-stents to circumflex and RCA; residual total occlusion of the left anterior descending; normal ejection fraction; stress nuclear in 2001-distal anteroseptal ischemia; normal LV function  . Chronic kidney disease, stage 3, mod decreased GFR nephrologist-  at [facility] in [place]    Creatinine-2.18 and 12/2007, 1.76 and 12/2008  . First degree heart block   . History of gastric ulcer   . History of kidney stones    05/23/17- small = no pain- watching  . History of MI (myocardial infarction)    1990-  treated w/ TPA  . Hyperlipidemia   . Hypertension   . Myocardial infarction ([place]) 1990  . OA (osteoarthritis)   . Pre-diabetes   . Prediabetes   . Prostate cancer [facility]) urologist-- dr dahlstedt/  oncologist- dr Tammi Klippel   Stage T1c , Gleason 3+4,  PSA 4.7,  vol 24cc--  scheduled for radiative seed implants  . Psoriatic arthritis ([place])   . RA (rheumatoid arthritis) ([place])    rheumologist-  dr Toni Amend  . S/P drug eluting coronary stent placement    1999--  DES x2 to CFX and RCA  . Sinus bradycardia   . Wears dentures    upper and lower partial    Family History  Problem Relation Age of Onset  . Heart attack Mother   . Coronary artery disease Father     Past Surgical History:  Procedure Laterality Date  . CARDIOVASCULAR STRESS TEST  2001  per Dr Lattie Haw clinic note   distal anteroseptal ischemia,  normal LVF  . COLONOSCOPY  last one 2006 (approx)  . CORONARY ANGIOPLASTY WITH STENT PLACEMENT  1999   DES x2  to CFX and RCA/  residual total occlusion LAD,  normal LVEF  . EYE SURGERY Bilateral    cataract  . POSTERIOR CERVICAL FUSION/FORAMINOTOMY N/A 05/26/2017   Procedure: RIGHT C4-5 FORAMINOTOMY WITH EXCISION OF HERNIATED [place];  Surgeon: Jessy Oto, MD;  Location: [facility];  Service: Orthopedics;  Laterality: N/A;  . RADIOACTIVE SEED IMPLANT N/A 01/11/2016   Procedure: RADIOACTIVE SEED IMPLANT/BRACHYTHERAPY IMPLANT;  Surgeon: Franchot Gallo, MD;  Location: [facility];  Service: Urology;  Laterality: N/A;   73  seeds implanted no seeds founds in bladder   Social History   Occupational History  . Retired Clinical biochemist    Social History Main Topics  . Smoking status: Former Smoker    Packs/day: 1.00    Years: 30.00    Types: Cigarettes     Quit date: 05/21/1989  . Smokeless tobacco: Never Used  . Alcohol use 21.0 oz/week    14 Shots of liquor, 21 Standard drinks or equivalent per week     Comment: 2-3 oz per day  . Drug use: No  . Sexual activity: Not on file

## 2017-04-15 ENCOUNTER — Ambulatory Visit: Payer: Medicare HMO | Admitting: General Surgery

## 2017-04-15 ENCOUNTER — Other Ambulatory Visit: Payer: Self-pay | Admitting: Radiology

## 2017-04-15 ENCOUNTER — Telehealth: Payer: Self-pay | Admitting: Rheumatology

## 2017-04-15 DIAGNOSIS — Z79899 Other long term (current) drug therapy: Secondary | ICD-10-CM

## 2017-04-15 NOTE — Telephone Encounter (Signed)
Please release labs for [facility]. Patient is there now to have labs drawn

## 2017-04-16 ENCOUNTER — Ambulatory Visit (HOSPITAL_COMMUNITY)
Admission: RE | Admit: 2017-04-16 | Discharge: 2017-04-16 | Disposition: A | Discharge status: Home / Self Care | Payer: Medicare HMO | Source: Ambulatory Visit | Attending: Surgery | Admitting: Surgery

## 2017-04-16 ENCOUNTER — Encounter (HOSPITAL_COMMUNITY): Payer: Self-pay

## 2017-04-16 DIAGNOSIS — M5412 Radiculopathy, cervical region: Secondary | ICD-10-CM

## 2017-04-16 LAB — COMPLETE METABOLIC PANEL WITH GFR
ALT: 35 U/L (ref 9–46)
AST: 20 U/L (ref 10–35)
Albumin: 3.6 g/dL (ref 3.6–5.1)
Alkaline Phosphatase: 95 U/L (ref 40–115)
BUN: 21 mg/dL (ref 7–25)
CO2: 28 mmol/L (ref 20–31)
Calcium: 8.9 mg/dL (ref 8.6–10.3)
Chloride: 105 mmol/L (ref 98–110)
Creat: 1.31 mg/dL — ABNORMAL HIGH (ref 0.70–1.18)
GFR, Est African American: 62 mL/min (ref >=60)
GFR, Est Non African American: 54 mL/min — ABNORMAL LOW (ref >=60)
Glucose, Bld: 90 mg/dL (ref 65–99)
Potassium: 4.7 mmol/L (ref 3.5–5.3)
Sodium: 141 mmol/L (ref 135–146)
Total Bilirubin: 0.5 mg/dL (ref 0.2–1.2)
Total Protein: 6.5 g/dL (ref 6.1–8.1)

## 2017-04-16 LAB — CBC WITH DIFFERENTIAL/PLATELET
Basophils Absolute: 0 cells/uL (ref 0–200)
Basophils Relative: 0 %
Eosinophils Absolute: 260 cells/uL (ref 15–500)
Eosinophils Relative: 4 %
HCT: 42.5 % (ref 38.5–50.0)
Hemoglobin: 14.1 g/dL (ref 13.2–17.1)
Lymphocytes Relative: 22 %
Lymphs Abs: 1430 cells/uL (ref 850–3900)
MCH: 29.2 pg (ref 27.0–33.0)
MCHC: 33.2 g/dL (ref 32.0–36.0)
MCV: 88 fL (ref 80.0–100.0)
MPV: 11.2 fL (ref 7.5–12.5)
Monocytes Absolute: 910 cells/uL (ref 200–950)
Monocytes Relative: 14 %
Neutro Abs: 3900 cells/uL (ref 1500–7800)
Neutrophils Relative %: 60 %
Platelets: 161 10*3/uL (ref 140–400)
RBC: 4.83 MIL/uL (ref 4.20–5.80)
RDW: 14.6 % (ref 11.0–15.0)
WBC: 6.5 10*3/uL (ref 3.8–10.8)

## 2017-04-16 LAB — URIC ACID: Uric Acid, Serum: 4.9 mg/dL (ref 4.0–8.0)

## 2017-04-17 ENCOUNTER — Encounter: Payer: Self-pay | Admitting: General Surgery

## 2017-04-17 ENCOUNTER — Ambulatory Visit (INDEPENDENT_AMBULATORY_CARE_PROVIDER_SITE_OTHER): Payer: Medicare HMO | Admitting: General Surgery

## 2017-04-17 ENCOUNTER — Telehealth (INDEPENDENT_AMBULATORY_CARE_PROVIDER_SITE_OTHER): Payer: Self-pay | Admitting: Specialist

## 2017-04-17 VITALS — BP 126/72 | HR 60 | Temp 97.5°F | Resp 18 | Ht 70.0 in | Wt 194.0 lb

## 2017-04-17 DIAGNOSIS — R198 Other specified symptoms and signs involving the digestive system and abdomen: Secondary | ICD-10-CM | POA: Diagnosis not present

## 2017-04-17 NOTE — Telephone Encounter (Signed)
Pt wife called about pt MRI, stated he could not go through with MRI because of claustrophobia. Requested a call back to discuss other options please.  [phone]

## 2017-04-17 NOTE — Telephone Encounter (Signed)
Can we get him rescheduled for open unit here in town? And also he wants to know if he needs to get his copay back from there is [facility] and bring it to this MRI ---Im not sure how this will work.

## 2017-04-17 NOTE — Progress Notes (Signed)
Jacob Farmer; [id number]; 1946/11/30   HPI Patient has been referred by Dr. Gerarda Fraction for evaluation and treatment of swelling along the right flank of the abdomen. Patient states he has had the swelling there for many years. He does point to some point tenderness with a knot that was present in the right flank. He actually has multiple other complaints including urologic issues. He denies any nausea, vomiting, diarrhea, or constipation. He has 0 pain at the present time. Past Medical History:  Diagnosis Date  . Arteriosclerotic cardiovascular disease (ASCVD) cardiologist-  dr Roderic [place] [facility]   Acute myocardial infarction treated with TPA in 1990; 1999-stents to circumflex and RCA; residual total occlusion of the left anterior descending; normal ejection fraction; stress nuclear in 2001-distal anteroseptal ischemia; normal LV function  . Chronic kidney disease, stage 3, mod decreased GFR nephrologist-  at [facility] in [place]   Creatinine-2.18 and 12/2007, 1.76 and 12/2008  . First degree heart block   . History of gastric ulcer   . History of MI (myocardial infarction)    1990-  treated w/ TPA  . Hyperlipidemia   . Hypertension   . OA (osteoarthritis)   . Prediabetes   . Prostate cancer [facility]) urologist-- dr dahlstedt/  oncologist- dr Tammi Klippel   Stage T1c , Gleason 3+4,  PSA 4.7,  vol 24cc--  scheduled for radiative seed implants  . Psoriatic arthritis ([place])   . RA (rheumatoid arthritis) ([place])    rheumologist-  dr Toni Amend  . S/P drug eluting coronary stent placement    1999--  DES x2 to CFX and RCA  . Sinus bradycardia   . Wears dentures    upper and lower partial    Past Surgical History:  Procedure Laterality Date  . CARDIOVASCULAR STRESS TEST  2001  per Dr Lattie Haw clinic note   distal anteroseptal ischemia,  normal LVF  . COLONOSCOPY  last one 2006 (approx)  . CORONARY ANGIOPLASTY WITH STENT PLACEMENT  1999   DES x2  to CFX and RCA/  residual total occlusion LAD,  normal  LVEF  . RADIOACTIVE SEED IMPLANT N/A 01/11/2016   Procedure: RADIOACTIVE SEED IMPLANT/BRACHYTHERAPY IMPLANT;  Surgeon: Franchot Gallo, MD;  Location: [facility];  Service: Urology;  Laterality: N/A;   77  seeds implanted no seeds founds in bladder    Family History  Problem Relation Age of Onset  . Heart attack Mother   . Coronary artery disease Father     Current Outpatient Prescriptions on File Prior to Visit  Medication Sig Dispense Refill  . allopurinol (ZYLOPRIM) 100 MG tablet Take 1 tablet (100 mg total) by mouth daily. 90 tablet 1  . amLODipine (NORVASC) 5 MG tablet Take 1 tablet (5 mg total) by mouth daily. (Patient taking differently: Take 5 mg by mouth every morning. ) 90 tablet 3  . aspirin 81 MG tablet Take 81 mg by mouth every other day.     Marland Kitchen atorvastatin (LIPITOR) 80 MG tablet Take 1 tablet (80 mg total) by mouth daily. (Patient taking differently: Take 80 mg by mouth every evening. ) 90 tablet 3  . baclofen (LIORESAL) 10 MG tablet Take 1 tablet (10 mg total) by mouth every 8 (eight) hours as needed for muscle spasms (Pain). 270 tablet 3  . glimepiride (AMARYL) 1 MG tablet Take 1 mg by mouth daily before breakfast.    . leflunomide (ARAVA) 10 MG tablet Take 1 tablet (10 mg total) by mouth daily. 90 tablet 0  . metoprolol (  LOPRESSOR) 50 MG tablet TAKE ONE TABLET BY MOUTH TWICE DAILY 60 tablet 6  . Omega-3 Fatty Acids (FISH OIL) 1000 MG CAPS Take 1,000 mg by mouth daily.    . pregabalin (LYRICA) 50 MG capsule Take 1 capsule (50 mg total) by mouth 2 (two) times daily. 60 capsule 3  . tamsulosin (FLOMAX) 0.4 MG CAPS capsule Take 0.4 mg by mouth.     Current Facility-Administered Medications on File Prior to Visit  Medication Dose Route Frequency Provider Last Rate Last Dose  . lidocaine (PF) (XYLOCAINE) 1 % injection 0.3 mL  0.3 mL Other Once Magnus Sinning, MD        No Known Allergies  History  Alcohol Use  . 21.0 oz/week  . 14 Shots of liquor, 21  Standard drinks or equivalent per week    Comment: 2-3 oz per day    History  Smoking Status  . Former Smoker  . Packs/day: 1.00  . Years: 30.00  . Types: Cigarettes  . Quit date: 05/21/1989  Smokeless Tobacco  . Never Used    Review of Systems  Constitutional: Positive for malaise/fatigue.  HENT: Negative.   Eyes: Negative.   Respiratory: Negative.   Cardiovascular: Negative.   Gastrointestinal: Negative.   Genitourinary: Positive for dysuria, flank pain, frequency and urgency.  Musculoskeletal: Positive for joint pain and neck pain.  Skin: Negative.   Neurological: Negative.   Endo/Heme/Allergies: Negative.   Psychiatric/Behavioral: Negative.     Objective   Vitals:   04/17/17 1358  BP: 126/72  Pulse: 60  Resp: 18  Temp: 97.5 F (36.4 C)    Physical Exam  Constitutional: He is oriented to person, place, and time and well-developed, well-nourished, and in no distress.  HENT:  Head: Normocephalic and atraumatic.  Neck: Normal range of motion. Neck supple.  Cardiovascular: Normal rate, regular rhythm and normal heart sounds.   No murmur heard. Pulmonary/Chest: Effort normal. He has no wheezes. He has no rales.  Abdominal: Soft. Bowel sounds are normal. He exhibits no distension. There is no tenderness.  Patient has laxity of the abdominal wall along the right flank. No discrete lipoma or hernia were found. The patient did point to an area along the right flank which ended up being the end of the 11th rib.  Neurological: He is alert and oriented to person, place, and time.  Skin: Skin is warm and dry.  Vitals reviewed.  Dr. Nolon Rod notes reviewed. Assessment  Abdominal wall laxity. No specific lipoma or hernia identified. Plan   My findings were explained to the patient, who understood and agreed. Will follow up with me as needed.

## 2017-04-21 ENCOUNTER — Telehealth (INDEPENDENT_AMBULATORY_CARE_PROVIDER_SITE_OTHER): Payer: Self-pay | Admitting: Specialist

## 2017-04-21 ENCOUNTER — Telehealth: Payer: Self-pay | Admitting: Radiology

## 2017-04-21 ENCOUNTER — Other Ambulatory Visit (INDEPENDENT_AMBULATORY_CARE_PROVIDER_SITE_OTHER): Payer: Self-pay | Admitting: Surgery

## 2017-04-21 DIAGNOSIS — M5412 Radiculopathy, cervical region: Secondary | ICD-10-CM

## 2017-04-21 NOTE — Telephone Encounter (Signed)
Order has been changed to [facility] imaging, s/w Manus Gunning at [facility] imaging and needed order to be changed back to their dept fro accession number to be theirs and will call pt to schedule appt

## 2017-04-21 NOTE — Telephone Encounter (Signed)
Patient's wife called advised patient will need something prescribed for him before he gets the MRI. She advised he can not do the MRI without medication. Patient uses the [facility] in [place]

## 2017-04-21 NOTE — Telephone Encounter (Signed)
-----   Message from Eliezer Lofts, [place] sent at 04/17/2017 10:25 AM EDT ----- I'm sending labs to Dr. Gerarda Fraction And tell patient #1: Uric acid is normal and intracerebral range of 4.9. Continue current treatment for gout  #2: CBC with differential is normal  #3: CMP with GFR is within normal limits except for elevated creatinine at 1.31 red this is consistent with past labs) and GFR slightly low. (Consistent with past labs).

## 2017-04-21 NOTE — Telephone Encounter (Signed)
I have called patient to advise labs are c/w previous

## 2017-04-21 NOTE — Telephone Encounter (Signed)
Patient's wife called advised patient will need something prescribed for him before he gets the MRI. She advised he can not do the MRI without medication. Patient uses the [facility] in [place]. The number to contact is [phone].

## 2017-04-21 NOTE — Telephone Encounter (Signed)
Task [id number] created. Task notifications sent.   For change in facility. Pending auth for change

## 2017-04-25 ENCOUNTER — Telehealth (INDEPENDENT_AMBULATORY_CARE_PROVIDER_SITE_OTHER): Payer: Self-pay | Admitting: Radiology

## 2017-04-25 ENCOUNTER — Other Ambulatory Visit (INDEPENDENT_AMBULATORY_CARE_PROVIDER_SITE_OTHER): Payer: Self-pay | Admitting: Specialist

## 2017-04-25 MED ORDER — ALPRAZOLAM 0.5 MG PO TABS: ORAL_TABLET | ORAL | 0 refills | Status: DC

## 2017-04-25 NOTE — Telephone Encounter (Signed)
Xanax 0.5 mg to take at the MRI and may repeat x 1 if necessary. jen

## 2017-04-25 NOTE — Telephone Encounter (Signed)
I called rx to [facility], Tried to call patient but there was no answer

## 2017-04-25 NOTE — Telephone Encounter (Signed)
Patient's wife is calling requesting medication for patient to take prior to MRI. She states that she has called for several days and not received a call back. They will be going out of town, leaving tomorrow and not coming back until the following weekend. She needs to have this picked up prior to that so he will be ready for MRI. They use the [facility] in [place]. She requests a call back as soon as possible.

## 2017-04-25 NOTE — Telephone Encounter (Signed)
I tried to call the patient or his wife back but no one answer and no machine picked up--rx was called to [facility].

## 2017-04-25 NOTE — Progress Notes (Signed)
I called rx to [facility], Tried to call patient but there was no answer

## 2017-05-05 ENCOUNTER — Ambulatory Visit
Admission: RE | Admit: 2017-05-05 | Discharge: 2017-05-05 | Disposition: A | Discharge status: Home / Self Care | Payer: Medicare HMO | Source: Ambulatory Visit | Attending: Surgery | Admitting: Surgery

## 2017-05-05 DIAGNOSIS — M5412 Radiculopathy, cervical region: Secondary | ICD-10-CM

## 2017-05-05 DIAGNOSIS — M47812 Spondylosis without myelopathy or radiculopathy, cervical region: Secondary | ICD-10-CM | POA: Diagnosis not present

## 2017-05-09 ENCOUNTER — Encounter (INDEPENDENT_AMBULATORY_CARE_PROVIDER_SITE_OTHER): Payer: Self-pay | Admitting: Specialist

## 2017-05-09 ENCOUNTER — Ambulatory Visit (INDEPENDENT_AMBULATORY_CARE_PROVIDER_SITE_OTHER): Payer: Medicare HMO | Admitting: Specialist

## 2017-05-09 VITALS — BP 123/63 | HR 57 | Ht 70.0 in | Wt 194.0 lb

## 2017-05-09 DIAGNOSIS — M5416 Radiculopathy, lumbar region: Secondary | ICD-10-CM

## 2017-05-09 DIAGNOSIS — M62838 Other muscle spasm: Secondary | ICD-10-CM

## 2017-05-09 DIAGNOSIS — M5412 Radiculopathy, cervical region: Secondary | ICD-10-CM | POA: Diagnosis not present

## 2017-05-09 DIAGNOSIS — M5020 Other cervical disc displacement, unspecified cervical region: Secondary | ICD-10-CM | POA: Diagnosis not present

## 2017-05-09 DIAGNOSIS — M5136 Other intervertebral disc degeneration, lumbar region: Secondary | ICD-10-CM | POA: Diagnosis not present

## 2017-05-09 MED ORDER — BACLOFEN 10 MG PO TABS: 10.0000 mg | ORAL_TABLET | Freq: Three times a day (TID) | ORAL | 3 refills | Status: DC | PRN

## 2017-05-09 NOTE — Patient Instructions (Signed)
Avoid overhead lifting and overhead use of the arms. Do not lift greater than 5 lbs. Adjust head rest in vehicle to prevent hyperextension if rear ended. Take extra precautions to avoid falling, including use of a cane if you feel weak. Scheduling secretary Kandice Hams will call you to arrange for surgery for your cervical spine. If you wish a second opinion please let us know and we can arrange for you. If you have worsening arm or leg numbness or weakness please call or go to an ER. We will contact your cardiologist and primary care physicians to seek clearance for your surgery. Surgery will be a right foraminotomy C4-5, with excision of a herniated nucleus pulposus .  Risks of surgery include risks of infection 1 in 100, bleeding less than 1% of a tranfusion,  and risks to the spinal cord 1:1,000,000and   Surgery is indicated due to upper extremity radiculopathy. In the future surgery at adjacent levels may be necessary but these levels do not appear to be related to your current symptoms or signs.

## 2017-05-09 NOTE — Addendum Note (Signed)
Addended by: Basil Dess on: 05/09/2017 12:52 PM   Modules accepted: Orders

## 2017-05-09 NOTE — Progress Notes (Addendum)
Office Visit Note   Patient: Jacob Farmer           Date of Birth: 11/26/1946           MRN: [id number] Visit Date: 05/09/2017              Requested by: Redmond School, [place], [place] [id number] PCP: Redmond School, MD   Assessment & Plan: Visit Diagnoses:  1. Degenerative disc disease, lumbar   2. Herniated disc, cervical   3. Right cervical radiculopathy   4. Spasm of muscle   5. Chronic left lumbar radiculopathy   71 year old male right handed with 4 month history of right neck and right shoulder pain with right shoulder weakness. He done well with conservative management of  Lumbar spinal stenosis with predominantly left leg symptoms. Returns now with primary right neck, right scapula and right shoulder pain to about the level of the right elbow. He demonstrates weakness in the right shoulder abduction 4/5 and right biceps 5-/5 with pain pattern consistent with a right cervical radiculopathy in C5 distribution. Being 4 months Into this condition he is not likely to improve with exercise or therapy. MRI shows a focal disc herniation right C4-5 with right C5 nerve compression. I recommend he undergo a right sided  foramenotomy and excision of disc herniation to relieve his symptoms and improve his function.  Plan: Avoid overhead lifting and overhead use of the arms. Do not lift greater than 5 lbs. Adjust head rest in vehicle to prevent hyperextension if rear ended. Take extra precautions to avoid falling, including use of a cane if you feel weak. Scheduling secretary Kandice Hams will call you to arrange for surgery for your cervical spine. If you wish a second opinion please let us know and we can arrange for you. If you have worsening arm or leg numbness or weakness please call or go to an ER. We will contact your cardiologist and primary care physicians to seek clearance for your surgery. Surgery will be a right foraminotomy C4-5, with excision of  a herniated nucleus pulposus .  Risks of surgery include risks of infection 1 in 100, bleeding less than 1% of a tranfusion,  and risks to the spinal cord 1:1,000,000and   Surgery is indicated due to upper extremity radiculopathy. In the future surgery at adjacent levels may be necessary but these levels do not appear to be related to your current symptoms or signs.  Follow-Up Instructions: Return in about 4 weeks (around 06/06/2017) for post surgery eval.   Orders:  No orders of the defined types were placed in this encounter.  Meds ordered this encounter  Medications  . baclofen (LIORESAL) 10 MG tablet    Sig: Take 1 tablet (10 mg total) by mouth every 8 (eight) hours as needed for muscle spasms (Pain).    Dispense:  270 tablet    Refill:  3      Procedures: No procedures performed   Clinical Data: Findings:  MRI of the cervical spine with Right C4-5 herniated disc with right C5 nerve compression. Multilevel spondylosis changes C2-3,C3-4, C5-6 and C6-7.  Previous cervical spine radiographs with spondylosis changes with degenerative disc space narrowing that is mild to moderate C2-3, C3-4, C5-6 and C6-7. The C4-5 disc space is fairly well maintained height with less spur formation. Minimal  Bilateral uncovertebral changes through out the cervical spine.    Subjective: Chief Complaint  Patient presents with  . Neck - Follow-up  MRI Review CSP    71 year old right handed male with 4 month history of neck pain and radiation into the right shoulder and right upper lateral arm to the level of the right elbow. He has worse pain at night with numbness and tingling. Weakness in raising the right arm. We have treated him conservatively for lumbar spondylosis and spinal stenosis with left leg pain and weakness. He responded to [facility] and exercises. Now with increasing right neck and shoulder pain. No bowel or bladder difficulty. Walking  Better, is performing well with an exercise bike.       Review of Systems  Constitutional: Negative.   HENT: Negative.   Eyes: Negative.   Respiratory: Negative.   Cardiovascular: Negative.   Gastrointestinal: Negative.   Endocrine: Negative.   Genitourinary: Negative.   Musculoskeletal: Negative.   Skin: Negative.   Allergic/Immunologic: Negative.   Neurological: Negative.   Hematological: Negative.   Psychiatric/Behavioral: Negative.      Objective: Vital Signs: BP 123/63 (BP Location: Left Arm, Patient Position: Sitting)   Pulse (!) 57   Ht 5\' 10"  (1.778 m)   Wt 194 lb (88 kg)   BMI 27.84 kg/m   Physical Exam  Constitutional: He is oriented to person, place, and time. He appears well-developed and well-nourished.  HENT:  Head: Normocephalic and atraumatic.  Eyes: EOM are normal. Pupils are equal, round, and reactive to light.  Neck: Normal range of motion. Neck supple.  Pulmonary/Chest: Effort normal and breath sounds normal.  Abdominal: Soft. Bowel sounds are normal.  Neurological: He is alert and oriented to person, place, and time.  Skin: Skin is warm and dry.  Psychiatric: He has a normal mood and affect. His behavior is normal. Judgment and thought content normal.    Back Exam   Tenderness  The patient is experiencing tenderness in the cervical.  Range of Motion  Extension: abnormal  Flexion: normal  Lateral Bend Right: normal  Lateral Bend Left: normal  Rotation Right: normal   Tests  Straight leg raise right: negative Straight leg raise left: negative  Reflexes  Biceps:  0/4 abnormal Babinski's sign: abnormal   Other  Toe Walk: normal Heel Walk: normal Gait: normal   Comments:  Right shoulder abduction 4/5, right biceps 5-/5, shoulder ROM and impingement testing is normal and pain free      Specialty Comments:  No specialty comments available.  Imaging: No results found.   PMFS History: Patient Active Problem List   Diagnosis Date Noted  . Lumbar disc disease 02/04/2017    . Primary osteoarthritis of both hands 02/04/2017  . History of gout 02/04/2017  . DDD (degenerative disc disease), lumbar 02/04/2017  . Pain in joint of left knee 02/04/2017  . High risk medication use 01/28/2017  . History of prostate cancer 01/28/2017  . History of renal insufficiency syndrome 01/28/2017  . Malignant neoplasm of prostate ([place]) 11/17/2015  . Chronic kidney disease, stage 3, mod decreased GFR 12/27/2011  . Diabetes mellitus, type II ([place]) 12/27/2011  . Rheumatoid arthritis ([place]) 01/04/2011  . SINUS BRADYCARDIA 01/12/2010  . 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 HYPERLIPIDEMIA 01/08/2010  . Hypertension 01/08/2010   Past Medical History:  Diagnosis Date  . Arteriosclerotic cardiovascular disease (ASCVD) cardiologist-  dr Roderic Palau branch   Acute myocardial infarction treated with TPA in 1990; 1999-stents to circumflex and RCA; residual total occlusion of the left anterior descending; normal ejection fraction; stress nuclear in 2001-distal anteroseptal ischemia; normal LV function  .  Chronic kidney disease, stage 3, mod decreased GFR nephrologist-  at [employer] in [place]   Creatinine-2.18 and 12/2007, 1.76 and 12/2008  . First degree heart block   . History of gastric ulcer   . History of MI (myocardial infarction)    1990-  treated w/ TPA  . Hyperlipidemia   . Hypertension   . OA (osteoarthritis)   . Prediabetes   . Prostate cancer [facility]) urologist-- dr dahlstedt/  oncologist- dr Tammi Klippel   Stage T1c , Gleason 3+4,  PSA 4.7,  vol 24cc--  scheduled for radiative seed implants  . Psoriatic arthritis ([place])   . RA (rheumatoid arthritis) ([place])    rheumologist-  dr Toni Amend  . S/P drug eluting coronary stent placement    1999--  DES x2 to CFX and RCA  . Sinus bradycardia   . Wears dentures    upper and lower partial    Family History  Problem Relation Age of Onset  . Heart attack Mother   . Coronary artery disease Father     Past  Surgical History:  Procedure Laterality Date  . CARDIOVASCULAR STRESS TEST  2001  per Dr Lattie Haw clinic note   distal anteroseptal ischemia,  normal LVF  . COLONOSCOPY  last one 2006 (approx)  . CORONARY ANGIOPLASTY WITH STENT PLACEMENT  1999   DES x2  to CFX and RCA/  residual total occlusion LAD,  normal LVEF  . RADIOACTIVE SEED IMPLANT N/A 01/11/2016   Procedure: RADIOACTIVE SEED IMPLANT/BRACHYTHERAPY IMPLANT;  Surgeon: Franchot Gallo, MD;  [facility]: [facility];  Service: Urology;  Laterality: N/A;   71  seeds implanted no seeds founds in bladder   Social History   Occupational History  . Retired Clinical biochemist    Social History Main Topics  . Smoking status: Former Smoker    Packs/day: 1.00    Years: 30.00    Types: Cigarettes    Quit date: 05/21/1989  . Smokeless tobacco: Never Used  . Alcohol use 21.0 oz/week    14 Shots of liquor, 21 Standard drinks or equivalent per week     Comment: 2-3 oz per day  . Drug use: No  . Sexual activity: Not on file

## 2017-05-15 ENCOUNTER — Ambulatory Visit (INDEPENDENT_AMBULATORY_CARE_PROVIDER_SITE_OTHER): Payer: Medicare HMO | Admitting: Cardiology

## 2017-05-15 ENCOUNTER — Encounter: Payer: Self-pay | Admitting: *Deleted

## 2017-05-15 VITALS — BP 127/73 | HR 58 | Ht 70.0 in | Wt 192.0 lb

## 2017-05-15 DIAGNOSIS — I10 Essential (primary) hypertension: Secondary | ICD-10-CM | POA: Diagnosis not present

## 2017-05-15 DIAGNOSIS — I2510 Atherosclerotic heart disease of native coronary artery without angina pectoris: Secondary | ICD-10-CM | POA: Diagnosis not present

## 2017-05-15 DIAGNOSIS — Z01810 Encounter for preprocedural cardiovascular examination: Secondary | ICD-10-CM | POA: Diagnosis not present

## 2017-05-15 DIAGNOSIS — E782 Mixed hyperlipidemia: Secondary | ICD-10-CM

## 2017-05-15 NOTE — Progress Notes (Signed)
Clinical Summary [name] is a 71 y.o.male seen today for follow up of the following medical problems.    1. CAD - multiple interventions as described below - normal LV function per prior clinic notes, there is no documented study in our sytem  - no recent chest pain. No SOB or DOE - he remains compliant with meds.  - uses exercise bike daily, can go over a mile without troubles.    2. HTN - he is compliant with meds   3. Hyperlipidemia - compliant with atrovastatin  4. CKD - followed by nephrology  5. Prostate CA - had radioactive seeds placed, followed by urology.    6. Preoperative evluation -he is being considered for possible neck surgery.      SH: Daughter lives in [place]. Has stepson that is a Mudlogger. He is former marine.    Past Medical History:  Diagnosis Date  . Arteriosclerotic cardiovascular disease (ASCVD) cardiologist-  dr Jacob Farmer   Acute myocardial infarction treated with TPA in 1990; 1999-stents to circumflex and RCA; residual total occlusion of the left anterior descending; normal ejection fraction; stress nuclear in 2001-distal anteroseptal ischemia; normal LV function  . Chronic kidney disease, stage 3, mod decreased GFR nephrologist-  at [facility] in [place]   Creatinine-2.18 and 12/2007, 1.76 and 12/2008  . First degree heart block   . History of gastric ulcer   . History of MI (myocardial infarction)    1990-  treated w/ TPA  . Hyperlipidemia   . Hypertension   . OA (osteoarthritis)   . Prediabetes   . Prostate cancer [facility]) urologist-- dr Jacob Farmer/  oncologist- dr Jacob Farmer   Stage T1c , Gleason 3+4,  PSA 4.7,  vol 24cc--  scheduled for radiative seed implants  . Psoriatic arthritis ([place])   . RA (rheumatoid arthritis) ([place])    rheumologist-  dr Jacob Farmer  . S/P drug eluting coronary stent placement    1999--  DES x2 to CFX and RCA  . Sinus bradycardia   . Wears dentures    upper and  lower partial     No Known Allergies   Current Outpatient Prescriptions  Medication Sig Dispense Refill  . allopurinol (ZYLOPRIM) 100 MG tablet Take 1 tablet (100 mg total) by mouth daily. 90 tablet 1  . ALPRAZolam (XANAX) 0.5 MG tablet Take one tablet at the MRI for anxiety, may repeat x 1. 2 tablet 0  . amLODipine (NORVASC) 5 MG tablet Take 1 tablet (5 mg total) by mouth daily. (Patient taking differently: Take 5 mg by mouth every morning. ) 90 tablet 3  . aspirin 81 MG tablet Take 81 mg by mouth every other day.     Marland Kitchen atorvastatin (LIPITOR) 80 MG tablet Take 1 tablet (80 mg total) by mouth daily. (Patient taking differently: Take 80 mg by mouth every evening. ) 90 tablet 3  . baclofen (LIORESAL) 10 MG tablet Take 1 tablet (10 mg total) by mouth every 8 (eight) hours as needed for muscle spasms (Pain). 270 tablet 3  . glimepiride (AMARYL) 1 MG tablet Take 1 mg by mouth daily before breakfast.    . leflunomide (ARAVA) 10 MG tablet Take 1 tablet (10 mg total) by mouth daily. 90 tablet 0  . metoprolol (LOPRESSOR) 50 MG tablet TAKE ONE TABLET BY MOUTH TWICE DAILY 60 tablet 6  . Omega-3 Fatty Acids (FISH OIL) 1000 MG CAPS Take 1,000 mg by mouth daily.    . tamsulosin (  FLOMAX) 0.4 MG CAPS capsule Take 0.4 mg by mouth.     Current Facility-Administered Medications  Medication Dose Route Frequency Provider Last Rate Last Dose  . lidocaine (PF) (XYLOCAINE) 1 % injection 0.3 mL  0.3 mL Other Once Jacob Sinning, MD         Past Surgical History:  Procedure Laterality Date  . CARDIOVASCULAR STRESS TEST  2001  per Dr Jacob Farmer clinic note   distal anteroseptal ischemia,  normal LVF  . COLONOSCOPY  last one 2006 (approx)  . CORONARY ANGIOPLASTY WITH STENT PLACEMENT  1999   DES x2  to CFX and RCA/  residual total occlusion LAD,  normal LVEF  . RADIOACTIVE SEED IMPLANT N/A 01/11/2016   Procedure: RADIOACTIVE SEED IMPLANT/BRACHYTHERAPY IMPLANT;  Surgeon: Jacob Gallo, MD;  Location[facility];  Service: Urology;  Laterality: N/A;   71  seeds implanted no seeds founds in bladder     No Known Allergies    Family History  Problem Relation Age of Onset  . Heart attack Mother   . Coronary artery disease Father      Social History [name] reports that he quit smoking about 28 years ago. His smoking use included Cigarettes. He has a 30.00 pack-year smoking history. He has never used smokeless tobacco. [name] reports that he drinks about 21.0 oz of alcohol per week .   Review of Systems CONSTITUTIONAL: No weight loss, fever, chills, weakness or fatigue.  HEENT: Eyes: No visual loss, blurred vision, double vision or yellow sclerae.No hearing loss, sneezing, congestion, runny nose or sore throat.  SKIN: No rash or itching.  CARDIOVASCULAR: per hpi RESPIRATORY: No shortness of breath, cough or sputum.  GASTROINTESTINAL: No anorexia, nausea, vomiting or diarrhea. No abdominal pain or blood.  GENITOURINARY: No burning on urination, no polyuria NEUROLOGICAL: No headache, dizziness, syncope, paralysis, ataxia, numbness or tingling in the extremities. No change in bowel or bladder control.  MUSCULOSKELETAL: +neck pain LYMPHATICS: No enlarged nodes. No history of splenectomy.  PSYCHIATRIC: No history of depression or anxiety.  ENDOCRINOLOGIC: No reports of sweating, cold or heat intolerance. No polyuria or polydipsia.  Marland Kitchen   Physical Examination Vitals:   05/15/17 1336  BP: 127/73  Pulse: (!) 58   Vitals:   05/15/17 1336  Weight: 192 lb (87.1 kg)  Height: 5\' 10"  (1.778 m)    Gen: resting comfortably, no acute distress HEENT: no scleral icterus, pupils equal round and reactive, no palptable cervical adenopathy,  CV: RRR, no m/r/g, no jvd Resp: Clear to auscultation bilaterally GI: abdomen is soft, non-tender, non-distended, normal bowel sounds, no hepatosplenomegaly MSK: extremities are warm, no edema.  Skin: warm, no rash Neuro:  no focal  deficits Psych: appropriate affect    Assessment and Plan  1. CAD - no recent symptoms - continue current meds  2. HTN - his bp is at goal, continue current meds  3. Hyperlipidemia - continue high dose statin in setting of known CAD - request labs from pcp  4. Preoperative evaluation  - being considered for neck surgery - tolerates greater than 4 METs regulary without limitation - recommend proceeding with surgery as planned from cardiac standpoint  F/u 1 year    Arnoldo Lenis, M.D.

## 2017-05-15 NOTE — Patient Instructions (Signed)

## 2017-05-22 DIAGNOSIS — C61 Malignant neoplasm of prostate: Secondary | ICD-10-CM | POA: Diagnosis not present

## 2017-05-22 DIAGNOSIS — Z0000 Encounter for general adult medical examination without abnormal findings: Secondary | ICD-10-CM | POA: Diagnosis not present

## 2017-05-22 DIAGNOSIS — Z6828 Body mass index (BMI) 28.0-28.9, adult: Secondary | ICD-10-CM | POA: Diagnosis not present

## 2017-05-22 DIAGNOSIS — M1991 Primary osteoarthritis, unspecified site: Secondary | ICD-10-CM | POA: Diagnosis not present

## 2017-05-22 DIAGNOSIS — I10 Essential (primary) hypertension: Secondary | ICD-10-CM | POA: Diagnosis not present

## 2017-05-22 DIAGNOSIS — M5412 Radiculopathy, cervical region: Secondary | ICD-10-CM | POA: Diagnosis not present

## 2017-05-22 DIAGNOSIS — K219 Gastro-esophageal reflux disease without esophagitis: Secondary | ICD-10-CM | POA: Diagnosis not present

## 2017-05-23 ENCOUNTER — Encounter (HOSPITAL_COMMUNITY): Payer: Self-pay | Admitting: *Deleted

## 2017-05-25 NOTE — Anesthesia Preprocedure Evaluation (Addendum)
Anesthesia Evaluation  Patient identified by MRN, date of birth, ID band Patient awake    Reviewed: Allergy & Precautions, NPO status , Patient's Chart, lab work & pertinent test results  Airway Mallampati: II  TM Distance: >3 FB Neck ROM: Full    Dental no notable dental hx. (+) Upper Dentures, Partial Lower, Dental Advisory Given   Pulmonary neg pulmonary ROS, former smoker,    Pulmonary exam normal breath sounds clear to auscultation       Cardiovascular hypertension, Pt. on medications + CAD, + Past MI and + Cardiac Stents  Normal cardiovascular exam+ dysrhythmias  Rhythm:Regular Rate:Normal     Neuro/Psych negative neurological ROS  negative psych ROS   GI/Hepatic negative GI ROS, (+)     substance abuse  alcohol use,   Endo/Other  diabetes  Renal/GU Renal InsufficiencyRenal disease  negative genitourinary   Musculoskeletal  (+) Arthritis , Rheumatoid disorders,    Abdominal   Peds negative pediatric ROS (+)  Hematology negative hematology ROS (+)   Anesthesia Other Findings   Reproductive/Obstetrics negative OB ROS                             Anesthesia Physical  Anesthesia Plan  ASA: III  Anesthesia Plan: General   Post-op Pain Management:    Induction: Intravenous  PONV Risk Score and Plan: 2 and Ondansetron, Dexamethasone and Treatment may vary due to age or medical condition  Airway Management Planned: Oral ETT  Additional Equipment:   Intra-op Plan:   Post-operative Plan: Extubation in OR  Informed Consent: I have reviewed the patients History and Physical, chart, labs and discussed the procedure including the risks, benefits and alternatives for the proposed anesthesia with the patient or authorized representative who has indicated his/her understanding and acceptance.   Dental advisory given  Plan Discussed with: CRNA and Surgeon  Anesthesia Plan  Comments:         Anesthesia Quick Evaluation

## 2017-05-26 ENCOUNTER — Ambulatory Visit (HOSPITAL_COMMUNITY): Payer: Medicare HMO | Admitting: Anesthesiology

## 2017-05-26 ENCOUNTER — Ambulatory Visit (HOSPITAL_COMMUNITY): Payer: Medicare HMO

## 2017-05-26 ENCOUNTER — Ambulatory Visit: Payer: Medicare HMO | Admitting: Cardiology

## 2017-05-26 ENCOUNTER — Encounter (HOSPITAL_COMMUNITY): Payer: Self-pay | Admitting: Anesthesiology

## 2017-05-26 ENCOUNTER — Observation Stay (HOSPITAL_COMMUNITY)
Admission: RE | Admit: 2017-05-26 | Discharge: 2017-05-27 | Disposition: A | Discharge status: Home / Self Care | Payer: Medicare HMO | Source: Ambulatory Visit | Attending: Specialist | Admitting: Specialist

## 2017-05-26 ENCOUNTER — Ambulatory Visit (HOSPITAL_COMMUNITY)
Admission: RE | Disposition: A | Discharge status: Home / Self Care | Payer: Self-pay | Source: Ambulatory Visit | Attending: Specialist

## 2017-05-26 DIAGNOSIS — I129 Hypertensive chronic kidney disease with stage 1 through stage 4 chronic kidney disease, or unspecified chronic kidney disease: Secondary | ICD-10-CM | POA: Insufficient documentation

## 2017-05-26 DIAGNOSIS — Z87891 Personal history of nicotine dependence: Secondary | ICD-10-CM | POA: Diagnosis not present

## 2017-05-26 DIAGNOSIS — Z8546 Personal history of malignant neoplasm of prostate: Secondary | ICD-10-CM | POA: Diagnosis not present

## 2017-05-26 DIAGNOSIS — Z1810 Retained metal fragments, unspecified: Secondary | ICD-10-CM | POA: Diagnosis not present

## 2017-05-26 DIAGNOSIS — Z7982 Long term (current) use of aspirin: Secondary | ICD-10-CM | POA: Diagnosis not present

## 2017-05-26 DIAGNOSIS — M50221 Other cervical disc displacement at C4-C5 level: Principal | ICD-10-CM | POA: Insufficient documentation

## 2017-05-26 DIAGNOSIS — I259 Chronic ischemic heart disease, unspecified: Secondary | ICD-10-CM | POA: Diagnosis not present

## 2017-05-26 DIAGNOSIS — R7303 Prediabetes: Secondary | ICD-10-CM | POA: Insufficient documentation

## 2017-05-26 DIAGNOSIS — E785 Hyperlipidemia, unspecified: Secondary | ICD-10-CM | POA: Diagnosis not present

## 2017-05-26 DIAGNOSIS — M5020 Other cervical disc displacement, unspecified cervical region: Secondary | ICD-10-CM

## 2017-05-26 DIAGNOSIS — I252 Old myocardial infarction: Secondary | ICD-10-CM | POA: Insufficient documentation

## 2017-05-26 DIAGNOSIS — M069 Rheumatoid arthritis, unspecified: Secondary | ICD-10-CM | POA: Insufficient documentation

## 2017-05-26 DIAGNOSIS — Z79899 Other long term (current) drug therapy: Secondary | ICD-10-CM | POA: Diagnosis not present

## 2017-05-26 DIAGNOSIS — Z955 Presence of coronary angioplasty implant and graft: Secondary | ICD-10-CM | POA: Insufficient documentation

## 2017-05-26 DIAGNOSIS — R918 Other nonspecific abnormal finding of lung field: Secondary | ICD-10-CM | POA: Diagnosis not present

## 2017-05-26 DIAGNOSIS — N183 Chronic kidney disease, stage 3 (moderate): Secondary | ICD-10-CM | POA: Diagnosis not present

## 2017-05-26 DIAGNOSIS — Z01818 Encounter for other preprocedural examination: Secondary | ICD-10-CM

## 2017-05-26 DIAGNOSIS — Z7984 Long term (current) use of oral hypoglycemic drugs: Secondary | ICD-10-CM | POA: Insufficient documentation

## 2017-05-26 DIAGNOSIS — M4802 Spinal stenosis, cervical region: Secondary | ICD-10-CM | POA: Diagnosis not present

## 2017-05-26 DIAGNOSIS — M79601 Pain in right arm: Secondary | ICD-10-CM | POA: Diagnosis not present

## 2017-05-26 DIAGNOSIS — E1122 Type 2 diabetes mellitus with diabetic chronic kidney disease: Secondary | ICD-10-CM | POA: Diagnosis not present

## 2017-05-26 DIAGNOSIS — I2510 Atherosclerotic heart disease of native coronary artery without angina pectoris: Secondary | ICD-10-CM | POA: Diagnosis not present

## 2017-05-26 DIAGNOSIS — Z419 Encounter for procedure for purposes other than remedying health state, unspecified: Secondary | ICD-10-CM

## 2017-05-26 DIAGNOSIS — E1101 Type 2 diabetes mellitus with hyperosmolarity with coma: Secondary | ICD-10-CM

## 2017-05-26 DIAGNOSIS — M5010 Cervical disc disorder with radiculopathy, unspecified cervical region: Secondary | ICD-10-CM | POA: Diagnosis present

## 2017-05-26 HISTORY — DX: Acute myocardial infarction, unspecified: I21.9

## 2017-05-26 HISTORY — DX: Prediabetes: R73.03

## 2017-05-26 HISTORY — PX: POSTERIOR CERVICAL FUSION/FORAMINOTOMY: SHX5038

## 2017-05-26 HISTORY — DX: Personal history of urinary calculi: Z87.442

## 2017-05-26 LAB — GLUCOSE, CAPILLARY
Glucose-Capillary: 147 mg/dL — ABNORMAL HIGH (ref 65–99)
Glucose-Capillary: 146 mg/dL — ABNORMAL HIGH (ref 65–99)
Glucose-Capillary: 182 mg/dL — ABNORMAL HIGH (ref 65–99)
Glucose-Capillary: 224 mg/dL — ABNORMAL HIGH (ref 65–99)
Glucose-Capillary: 167 mg/dL — ABNORMAL HIGH (ref 65–99)

## 2017-05-26 LAB — COMPREHENSIVE METABOLIC PANEL
ALT: 20 U/L (ref 17–63)
AST: 18 U/L (ref 15–41)
Albumin: 3.1 g/dL — ABNORMAL LOW (ref 3.5–5.0)
Alkaline Phosphatase: 83 U/L (ref 38–126)
Anion gap: 7 (ref 5–15)
BUN: 15 mg/dL (ref 6–20)
CO2: 23 mmol/L (ref 22–32)
Calcium: 8.6 mg/dL — ABNORMAL LOW (ref 8.9–10.3)
Chloride: 109 mmol/L (ref 101–111)
Creatinine, Ser: 1.19 mg/dL (ref 0.61–1.24)
GFR calc Af Amer: >60 mL/min (ref >60)
GFR calc non Af Amer: 59 mL/min — ABNORMAL LOW (ref >60)
Glucose, Bld: 138 mg/dL — ABNORMAL HIGH (ref 65–99)
Potassium: 4 mmol/L (ref 3.5–5.1)
Sodium: 139 mmol/L (ref 135–145)
Total Bilirubin: 0.9 mg/dL (ref 0.3–1.2)
Total Protein: 6 g/dL — ABNORMAL LOW (ref 6.5–8.1)

## 2017-05-26 LAB — CBC
HCT: 39.1 % (ref 39.0–52.0)
Hemoglobin: 12.9 g/dL — ABNORMAL LOW (ref 13.0–17.0)
MCH: 30.1 pg (ref 26.0–34.0)
MCHC: 33 g/dL (ref 30.0–36.0)
MCV: 91.1 fL (ref 78.0–100.0)
Platelets: 142 10*3/uL — ABNORMAL LOW (ref 150–400)
RBC: 4.29 MIL/uL (ref 4.22–5.81)
RDW: 14.6 % (ref 11.5–15.5)
WBC: 7.9 10*3/uL (ref 4.0–10.5)

## 2017-05-26 LAB — SURGICAL PCR SCREEN
MRSA, PCR: NEGATIVE
Staphylococcus aureus: NEGATIVE

## 2017-05-26 LAB — PROTIME-INR
INR: 1.04
Prothrombin Time: 13.6 seconds (ref 11.4–15.2)

## 2017-05-26 LAB — APTT: aPTT: 25 seconds (ref 24–36)

## 2017-05-26 SURGERY — POSTERIOR CERVICAL FUSION/FORAMINOTOMY LEVEL 1
Anesthesia: General

## 2017-05-26 MED ORDER — HYDROMORPHONE HCL 1 MG/ML IJ SOLN
INTRAMUSCULAR | Status: AC
  Filled 2017-05-26: qty 0.5

## 2017-05-26 MED ORDER — FENTANYL CITRATE (PF) 100 MCG/2ML IJ SOLN
INTRAMUSCULAR | Status: DC | PRN
  Administered 2017-05-26 (×2): 50 ug via INTRAVENOUS
  Administered 2017-05-26: 100 ug via INTRAVENOUS
  Administered 2017-05-26: 50 ug via INTRAVENOUS

## 2017-05-26 MED ORDER — ROCURONIUM BROMIDE 10 MG/ML (PF) SYRINGE
PREFILLED_SYRINGE | INTRAVENOUS | Status: AC
  Filled 2017-05-26: qty 5

## 2017-05-26 MED ORDER — BACLOFEN 10 MG PO TABS: 10.0000 mg | ORAL_TABLET | Freq: Three times a day (TID) | ORAL | Status: DC | PRN

## 2017-05-26 MED ORDER — ATORVASTATIN CALCIUM 20 MG PO TABS
80.0000 mg | ORAL_TABLET | Freq: Every evening | ORAL | Status: DC
  Administered 2017-05-26: 80 mg via ORAL
  Filled 2017-05-26: qty 4

## 2017-05-26 MED ORDER — LIDOCAINE HCL (CARDIAC) 20 MG/ML IV SOLN
INTRAVENOUS | Status: DC | PRN
Start: 2017-05-26 — End: 2017-05-26
  Administered 2017-05-26: 100 mg via INTRAVENOUS

## 2017-05-26 MED ORDER — INSULIN ASPART 100 UNIT/ML ~~LOC~~ SOLN: 0.0000 [IU] | Freq: Every day | SUBCUTANEOUS | Status: DC

## 2017-05-26 MED ORDER — PHENOL 1.4 % MT LIQD: 1.0000 | OROMUCOSAL | Status: DC | PRN

## 2017-05-26 MED ORDER — HYDROMORPHONE HCL 1 MG/ML IJ SOLN
0.2500 mg | INTRAMUSCULAR | Status: DC | PRN
  Administered 2017-05-26: 0.5 mg via INTRAVENOUS

## 2017-05-26 MED ORDER — BISACODYL 5 MG PO TBEC: 5.0000 mg | DELAYED_RELEASE_TABLET | Freq: Every day | ORAL | Status: DC | PRN

## 2017-05-26 MED ORDER — LIDOCAINE 2% (20 MG/ML) 5 ML SYRINGE
INTRAMUSCULAR | Status: AC
  Filled 2017-05-26: qty 5

## 2017-05-26 MED ORDER — PROPOFOL 10 MG/ML IV BOLUS
INTRAVENOUS | Status: AC
  Filled 2017-05-26: qty 20

## 2017-05-26 MED ORDER — HYDROCODONE-ACETAMINOPHEN 5-325 MG PO TABS
1.0000 | ORAL_TABLET | ORAL | Status: DC | PRN
  Administered 2017-05-26 (×2): 2 via ORAL
  Filled 2017-05-26 (×2): qty 2

## 2017-05-26 MED ORDER — MIDAZOLAM HCL 2 MG/2ML IJ SOLN
INTRAMUSCULAR | Status: AC
  Filled 2017-05-26: qty 2

## 2017-05-26 MED ORDER — POLYETHYLENE GLYCOL 3350 17 G PO PACK: 17.0000 g | PACK | Freq: Every day | ORAL | Status: DC | PRN

## 2017-05-26 MED ORDER — FENTANYL CITRATE (PF) 250 MCG/5ML IJ SOLN
INTRAMUSCULAR | Status: AC
  Filled 2017-05-26: qty 5

## 2017-05-26 MED ORDER — ONDANSETRON HCL 4 MG/2ML IJ SOLN: 4.0000 mg | Freq: Four times a day (QID) | INTRAMUSCULAR | Status: DC | PRN

## 2017-05-26 MED ORDER — BUPIVACAINE LIPOSOME 1.3 % IJ SUSP
20.0000 mL | Freq: Once | INTRAMUSCULAR | Status: AC
  Administered 2017-05-26: 5 mL
  Filled 2017-05-26: qty 20

## 2017-05-26 MED ORDER — MORPHINE SULFATE (PF) 4 MG/ML IV SOLN
1.0000 mg | INTRAVENOUS | Status: DC | PRN
Start: 2017-05-26 — End: 2017-05-27

## 2017-05-26 MED ORDER — MIDAZOLAM HCL 5 MG/5ML IJ SOLN
INTRAMUSCULAR | Status: DC | PRN
Start: 2017-05-26 — End: 2017-05-26
  Administered 2017-05-26: 1 mg via INTRAVENOUS

## 2017-05-26 MED ORDER — SODIUM CHLORIDE 0.9 % IV SOLN
INTRAVENOUS | Status: DC | PRN
  Administered 2017-05-26: 25 ug/min via INTRAVENOUS

## 2017-05-26 MED ORDER — ASPIRIN 81 MG PO CHEW: 81.0000 mg | CHEWABLE_TABLET | ORAL | Status: DC

## 2017-05-26 MED ORDER — PROPOFOL 10 MG/ML IV BOLUS
INTRAVENOUS | Status: DC | PRN
  Administered 2017-05-26: 30 mg via INTRAVENOUS
  Administered 2017-05-26: 140 mg via INTRAVENOUS

## 2017-05-26 MED ORDER — SUGAMMADEX SODIUM 200 MG/2ML IV SOLN
INTRAVENOUS | Status: AC
  Filled 2017-05-26: qty 2

## 2017-05-26 MED ORDER — METHOCARBAMOL 500 MG PO TABS
500.0000 mg | ORAL_TABLET | Freq: Four times a day (QID) | ORAL | Status: DC | PRN
  Administered 2017-05-26 (×2): 500 mg via ORAL
  Filled 2017-05-26 (×2): qty 1

## 2017-05-26 MED ORDER — INSULIN ASPART 100 UNIT/ML ~~LOC~~ SOLN
0.0000 [IU] | Freq: Three times a day (TID) | SUBCUTANEOUS | Status: DC
  Administered 2017-05-26: 3 [IU] via SUBCUTANEOUS
  Administered 2017-05-26: 5 [IU] via SUBCUTANEOUS
  Administered 2017-05-27: 2 [IU] via SUBCUTANEOUS

## 2017-05-26 MED ORDER — CEFAZOLIN SODIUM-DEXTROSE 2-4 GM/100ML-% IV SOLN
2.0000 g | Freq: Three times a day (TID) | INTRAVENOUS | Status: AC
  Administered 2017-05-26 (×2): 2 g via INTRAVENOUS
  Filled 2017-05-26 (×2): qty 100

## 2017-05-26 MED ORDER — SODIUM CHLORIDE 0.9% FLUSH
3.0000 mL | Freq: Two times a day (BID) | INTRAVENOUS | Status: DC
  Administered 2017-05-26 (×2): 3 mL via INTRAVENOUS

## 2017-05-26 MED ORDER — 0.9 % SODIUM CHLORIDE (POUR BTL) OPTIME
TOPICAL | Status: DC | PRN
  Administered 2017-05-26: 1000 mL

## 2017-05-26 MED ORDER — GABAPENTIN 100 MG PO CAPS
100.0000 mg | ORAL_CAPSULE | Freq: Every day | ORAL | Status: DC
  Administered 2017-05-26: 100 mg via ORAL
  Filled 2017-05-26: qty 1

## 2017-05-26 MED ORDER — SODIUM CHLORIDE 0.9 % IV SOLN: 250.0000 mL | INTRAVENOUS | Status: DC

## 2017-05-26 MED ORDER — ONDANSETRON HCL 4 MG/2ML IJ SOLN
INTRAMUSCULAR | Status: DC | PRN
  Administered 2017-05-26: 4 mg via INTRAVENOUS

## 2017-05-26 MED ORDER — ONDANSETRON HCL 4 MG PO TABS: 4.0000 mg | ORAL_TABLET | Freq: Four times a day (QID) | ORAL | Status: DC | PRN

## 2017-05-26 MED ORDER — AMLODIPINE BESYLATE 5 MG PO TABS
5.0000 mg | ORAL_TABLET | Freq: Every morning | ORAL | Status: DC
  Filled 2017-05-26: qty 1

## 2017-05-26 MED ORDER — CELECOXIB 200 MG PO CAPS
200.0000 mg | ORAL_CAPSULE | Freq: Two times a day (BID) | ORAL | Status: DC
  Administered 2017-05-26: 200 mg via ORAL
  Filled 2017-05-26: qty 1

## 2017-05-26 MED ORDER — DEXAMETHASONE SODIUM PHOSPHATE 10 MG/ML IJ SOLN
INTRAMUSCULAR | Status: DC | PRN
  Administered 2017-05-26: 4 mg via INTRAVENOUS

## 2017-05-26 MED ORDER — FLEET ENEMA 7-19 GM/118ML RE ENEM
1.0000 | ENEMA | Freq: Once | RECTAL | Status: DC | PRN
Start: 2017-05-26 — End: 2017-05-27

## 2017-05-26 MED ORDER — MUPIROCIN 2 % EX OINT
1.0000 "application " | TOPICAL_OINTMENT | Freq: Once | CUTANEOUS | Status: AC
  Administered 2017-05-26: 1 via TOPICAL

## 2017-05-26 MED ORDER — ACETAMINOPHEN 650 MG RE SUPP: 650.0000 mg | RECTAL | Status: DC | PRN

## 2017-05-26 MED ORDER — THROMBIN 20000 UNITS EX KIT
PACK | CUTANEOUS | Status: DC | PRN
  Administered 2017-05-26: 20000 [IU] via TOPICAL

## 2017-05-26 MED ORDER — ONDANSETRON HCL 4 MG/2ML IJ SOLN
INTRAMUSCULAR | Status: AC
  Filled 2017-05-26: qty 2

## 2017-05-26 MED ORDER — INSULIN ASPART 100 UNIT/ML ~~LOC~~ SOLN
3.0000 [IU] | Freq: Three times a day (TID) | SUBCUTANEOUS | Status: DC
  Administered 2017-05-26 – 2017-05-27 (×3): 3 [IU] via SUBCUTANEOUS

## 2017-05-26 MED ORDER — BUPIVACAINE-EPINEPHRINE (PF) 0.5% -1:200000 IJ SOLN
INTRAMUSCULAR | Status: AC
  Filled 2017-05-26: qty 30

## 2017-05-26 MED ORDER — DEXAMETHASONE SODIUM PHOSPHATE 10 MG/ML IJ SOLN
INTRAMUSCULAR | Status: AC
  Filled 2017-05-26: qty 1

## 2017-05-26 MED ORDER — PANTOPRAZOLE SODIUM 40 MG IV SOLR
40.0000 mg | Freq: Every day | INTRAVENOUS | Status: DC
  Administered 2017-05-26: 40 mg via INTRAVENOUS
  Filled 2017-05-26: qty 40

## 2017-05-26 MED ORDER — ALLOPURINOL 100 MG PO TABS
100.0000 mg | ORAL_TABLET | Freq: Every day | ORAL | Status: DC
  Filled 2017-05-26: qty 1

## 2017-05-26 MED ORDER — MUPIROCIN 2 % EX OINT
TOPICAL_OINTMENT | CUTANEOUS | Status: AC
  Administered 2017-05-26: 1 via TOPICAL
  Filled 2017-05-26: qty 22

## 2017-05-26 MED ORDER — MENTHOL 3 MG MT LOZG: 1.0000 | LOZENGE | OROMUCOSAL | Status: DC | PRN

## 2017-05-26 MED ORDER — SODIUM CHLORIDE 0.9 % IV SOLN: INTRAVENOUS | Status: DC

## 2017-05-26 MED ORDER — CEFAZOLIN SODIUM-DEXTROSE 2-4 GM/100ML-% IV SOLN
2.0000 g | INTRAVENOUS | Status: AC
  Administered 2017-05-26: 2 g via INTRAVENOUS

## 2017-05-26 MED ORDER — SUCCINYLCHOLINE CHLORIDE 200 MG/10ML IV SOSY
PREFILLED_SYRINGE | INTRAVENOUS | Status: AC
  Filled 2017-05-26: qty 10

## 2017-05-26 MED ORDER — TAMSULOSIN HCL 0.4 MG PO CAPS: 0.4000 mg | ORAL_CAPSULE | ORAL | Status: DC

## 2017-05-26 MED ORDER — LACTATED RINGERS IV SOLN
INTRAVENOUS | Status: DC | PRN
  Administered 2017-05-26: 07:00:00 via INTRAVENOUS

## 2017-05-26 MED ORDER — METOPROLOL TARTRATE 25 MG PO TABS
50.0000 mg | ORAL_TABLET | Freq: Two times a day (BID) | ORAL | Status: DC
  Administered 2017-05-26: 50 mg via ORAL
  Filled 2017-05-26: qty 2

## 2017-05-26 MED ORDER — ALUM & MAG HYDROXIDE-SIMETH 200-200-20 MG/5ML PO SUSP: 30.0000 mL | Freq: Four times a day (QID) | ORAL | Status: DC | PRN

## 2017-05-26 MED ORDER — CHLORHEXIDINE GLUCONATE 4 % EX LIQD: 60.0000 mL | Freq: Once | CUTANEOUS | Status: DC

## 2017-05-26 MED ORDER — THROMBIN 20000 UNITS EX SOLR
CUTANEOUS | Status: AC
  Filled 2017-05-26: qty 20000

## 2017-05-26 MED ORDER — SODIUM CHLORIDE 0.9% FLUSH: 3.0000 mL | INTRAVENOUS | Status: DC | PRN

## 2017-05-26 MED ORDER — ROCURONIUM BROMIDE 100 MG/10ML IV SOLN
INTRAVENOUS | Status: DC | PRN
  Administered 2017-05-26: 60 mg via INTRAVENOUS

## 2017-05-26 MED ORDER — MEPERIDINE HCL 25 MG/ML IJ SOLN: 6.2500 mg | INTRAMUSCULAR | Status: DC | PRN

## 2017-05-26 MED ORDER — PROMETHAZINE HCL 25 MG/ML IJ SOLN: 6.2500 mg | INTRAMUSCULAR | Status: DC | PRN

## 2017-05-26 MED ORDER — DOCUSATE SODIUM 100 MG PO CAPS
100.0000 mg | ORAL_CAPSULE | Freq: Two times a day (BID) | ORAL | Status: DC
  Administered 2017-05-26: 100 mg via ORAL
  Filled 2017-05-26: qty 1

## 2017-05-26 MED ORDER — ACETAMINOPHEN 325 MG PO TABS: 650.0000 mg | ORAL_TABLET | ORAL | Status: DC | PRN

## 2017-05-26 MED ORDER — METHOCARBAMOL 1000 MG/10ML IJ SOLN: 500.0000 mg | Freq: Four times a day (QID) | INTRAMUSCULAR | Status: DC | PRN

## 2017-05-26 MED ORDER — BUPIVACAINE HCL 0.5 % IJ SOLN
INTRAMUSCULAR | Status: DC | PRN
  Administered 2017-05-26: 3 mL
  Administered 2017-05-26: 5 mL

## 2017-05-26 SURGICAL SUPPLY — 61 items
ADH SKN CLS APL DERMABOND .7 (GAUZE/BANDAGES/DRESSINGS)
APL SKNCLS STERI-STRIP NONHPOA (GAUZE/BANDAGES/DRESSINGS) ×1
BENZOIN TINCTURE PRP APPL 2/3 (GAUZE/BANDAGES/DRESSINGS) ×2 IMPLANT
BIT DRILL NEURO 2X3.1 SFT TUCH (MISCELLANEOUS) IMPLANT
BLADE CLIPPER SURG (BLADE) ×2 IMPLANT
BUR RND FLUTED 2.5 (BURR) ×2 IMPLANT
COLLAR CERV LO CONTOUR FIRM DE (SOFTGOODS) ×2 IMPLANT
COVER SURGICAL LIGHT HANDLE (MISCELLANEOUS) ×2 IMPLANT
DERMABOND ADVANCED (GAUZE/BANDAGES/DRESSINGS)
DERMABOND ADVANCED .7 DNX12 (GAUZE/BANDAGES/DRESSINGS) IMPLANT
DRAPE C-ARM 42X72 X-RAY (DRAPES) IMPLANT
DRAPE HALF SHEET 40X57 (DRAPES) ×6 IMPLANT
DRAPE LAPAROTOMY T 102X78X121 (DRAPES) IMPLANT
DRAPE MICROSCOPE LEICA (MISCELLANEOUS) ×2 IMPLANT
DRAPE SURG 17X23 STRL (DRAPES) ×8 IMPLANT
DRILL NEURO 2X3.1 SOFT TOUCH (MISCELLANEOUS)
DRSG MEPILEX BORDER 4X4 (GAUZE/BANDAGES/DRESSINGS) IMPLANT
DRSG MEPILEX BORDER 4X8 (GAUZE/BANDAGES/DRESSINGS) IMPLANT
DRSG PAD ABDOMINAL 8X10 ST (GAUZE/BANDAGES/DRESSINGS) ×4 IMPLANT
DURAPREP 6ML APPLICATOR 50/CS (WOUND CARE) ×2 IMPLANT
ELECT BLADE 4.0 EZ CLEAN MEGAD (MISCELLANEOUS) ×2
ELECT CAUTERY BLADE 6.4 (BLADE) ×2 IMPLANT
ELECT REM PT RETURN 9FT ADLT (ELECTROSURGICAL) ×2
ELECTRODE BLDE 4.0 EZ CLN MEGD (MISCELLANEOUS) ×1 IMPLANT
ELECTRODE REM PT RTRN 9FT ADLT (ELECTROSURGICAL) ×1 IMPLANT
EVACUATOR 1/8 PVC DRAIN (DRAIN) IMPLANT
GAUZE SPONGE 4X4 12PLY STRL (GAUZE/BANDAGES/DRESSINGS) ×2 IMPLANT
GLOVE BIOGEL PI IND STRL 7.0 (GLOVE) ×1 IMPLANT
GLOVE BIOGEL PI IND STRL 8 (GLOVE) ×1 IMPLANT
GLOVE BIOGEL PI INDICATOR 7.0 (GLOVE) ×1
GLOVE BIOGEL PI INDICATOR 8 (GLOVE) ×1
GLOVE ECLIPSE 9.0 STRL (GLOVE) ×2 IMPLANT
GLOVE ORTHO TXT STRL SZ7.5 (GLOVE) ×2 IMPLANT
GLOVE SURG 8.5 LATEX PF (GLOVE) ×4 IMPLANT
GLOVE SURG SS PI 6.5 STRL IVOR (GLOVE) ×4 IMPLANT
GOWN STRL REUS W/ TWL LRG LVL3 (GOWN DISPOSABLE) ×1 IMPLANT
GOWN STRL REUS W/TWL 2XL LVL3 (GOWN DISPOSABLE) ×4 IMPLANT
GOWN STRL REUS W/TWL LRG LVL3 (GOWN DISPOSABLE) ×2
KIT BASIN OR (CUSTOM PROCEDURE TRAY) ×2 IMPLANT
KIT ROOM TURNOVER OR (KITS) ×2 IMPLANT
NEEDLE SPNL 18GX3.5 QUINCKE PK (NEEDLE) ×2 IMPLANT
NS IRRIG 1000ML POUR BTL (IV SOLUTION) ×2 IMPLANT
PACK ORTHO CERVICAL (CUSTOM PROCEDURE TRAY) ×2 IMPLANT
PAD ARMBOARD 7.5X6 YLW CONV (MISCELLANEOUS) ×4 IMPLANT
PATTIES SURGICAL .25X.25 (GAUZE/BANDAGES/DRESSINGS) ×2 IMPLANT
PATTIES SURGICAL .75X.75 (GAUZE/BANDAGES/DRESSINGS) IMPLANT
SPONGE SURGIFOAM ABS GEL 100 (HEMOSTASIS) ×2 IMPLANT
STRIP CLOSURE SKIN 1/2X4 (GAUZE/BANDAGES/DRESSINGS) ×2 IMPLANT
SUT ETHIBOND CT1 BRD #0 30IN (SUTURE) IMPLANT
SUT VIC AB 0 CT1 27 (SUTURE)
SUT VIC AB 0 CT1 27XBRD ANBCTR (SUTURE) IMPLANT
SUT VIC AB 2-0 CT1 27 (SUTURE) ×2
SUT VIC AB 2-0 CT1 TAPERPNT 27 (SUTURE) ×1 IMPLANT
SUT VIC AB 2-0 UR6 27 (SUTURE) ×2 IMPLANT
SUT VIC AB 3-0 X1 27 (SUTURE) ×2 IMPLANT
SUT VICRYL 0 UR6 27IN ABS (SUTURE) ×2 IMPLANT
TAPE CLOTH 4X10 WHT NS (GAUZE/BANDAGES/DRESSINGS) ×2 IMPLANT
TOWEL OR 17X24 6PK STRL BLUE (TOWEL DISPOSABLE) ×2 IMPLANT
TOWEL OR 17X26 10 PK STRL BLUE (TOWEL DISPOSABLE) ×2 IMPLANT
TRAY FOLEY W/METER SILVER 16FR (SET/KITS/TRAYS/PACK) IMPLANT
WATER STERILE IRR 1000ML POUR (IV SOLUTION) ×2 IMPLANT

## 2017-05-26 NOTE — Op Note (Signed)
05/26/2017  9:51 AM  PATIENT:  Jacob Farmer  71 y.o. male  MRN: [id number]  OPERATIVE REPORT  PRE-OPERATIVE DIAGNOSIS:  Right C4-5 disc herniation  POST-OPERATIVE DIAGNOSIS:  Right C4-5 disc herniation  PROCEDURE:  Procedure(s): RIGHT C4-5 FORAMINOTOMY WITH EXCISION OF HERNIATED DISC    SURGEON:  Jessy Oto, MD     ASSISTANT:  Benjiman Core, PA-C  (Present throughout the entire procedure and necessary for completion of procedure in a timely manner)     ANESTHESIA:  General,    COMPLICATIONS:  None.   PROCEDURE:The patient was met in the holding area, and the appropriate right C4-5 cervical level identified and marked with "x" and my initials. All questions were answered and informed consent signed.   The patient was then transported to OR. The patient was then placed under general anesthesia without difficulty.No foley catheter was placed. The patient transferred to the operating room table prone position [place]field horseshoe with Oralia Manis. All pressure points well-padded PAS stockings.Shoulders taped down and skin over the posterior inferior aspect of the neck place in traction to decrease skin folds. The patient received appropriate preoperative antibiotic 2 grams ancef. prophylaxis.Time-out procedure was called and correct.   Sterile prep with DuraPrep and draped in the usual manner the shoulders were taped downwards and skin traction over the skin of the neck. Following DuraPrep draped in the usual manner. After timeout protocol incision was made approximately C3-5 in the midline. This following infiltration of skin and subcutaneous layers with marcaine 0.5% with exparel 1.3% 1:1 total of 10 cc. Incision carried through skin and subcutaneous layers using 10 blade scalpel and electrocautery down to the level ligamentum nuchae. Incision made along the right lateral aspect of the spinous process of C4 Clamp then placed at the spinous process of  C4. Intraoperative Cross table lateral  radiograph identified the clamp at the C4-5 interspinous process level. Then a marking pen was used to mark the spinous process of C4. Electrocautery then used to carefully incise the cervical muscles off the right lateral aspect of the spinous process of C4 and C5. Dividing the  spinous muscles off of the inferior aspect lamina at C4 exposing the C4-5 posterior aspect of the interlaminar space. The magnification headlamp were used during this portion procedure. Boss McCollough retractor was inserted. High-speed bur was used to remove a small portion of bone from the inferior aspect of lamina of C4 and the medial 20% of the inferior articular process of C4. Further thinning the superior aspect of the lamina right C5. A 1 mm Kerrison was then used to remove him from superior aspect of the lamina C5 and the medial aspect of the intra-articular process of C4 the 20%, exposing the superior articular process of C5. A 1 mm Kerrison was removed bone off the medial aspect of the superior articular process of C5 resecting 20% of the medial aspect of the superior articular process of C5. Ligamentum flavum then easily lifted superiorly  electrocautery unit cauterizing epidural veins deep to the ligamentum flavum  then resecting the ligamentum flavum. The operating room microscope was draped sterilely and brought into the field. Under the operating room microscope the epidural vein layer overlying the posterior aspect of the thecal sac and the C5 nerve root was then carefully lifted using a micro-titanium nerve hook then cauterized using bipolar electrocautery a # 15 blade scalpel then used to incise this overlying the C5 nerve root releasing the vascular leash a backward angle 3-0 microcurette then  used to remove a small portion of bone off the superior and medial aspect of the pedicle further mobilizing the C5 nerve root bipolar electrocautery to control all bleeding within the axillary area of the C5 nerve. Bone wax was  applied to bleeding cancellus bone surfaces are excellent hemostasis obtained  The disc explored using a [place] 4 found to be protruded. 11 blade scalpel used to incise the disc mild disc material found to be herniated and found to be prominent resulting in right C5 foramenal narrowing. Following this then hemostasis was obtained using thrombin-soaked Gelfoam and micro-pledgettes. When complete hemostasis was obtained all gel foam was removed the nerve hook could be easily passed out the neuroforamen without the lateral aspect of the C5 pedicle demonstrate the C5 neuroforamen completely decompressed. Irrigation was carried out no active bleeding was present. The incision was closed by approximating the ligamentum nuchae with #1 ethibond sutures. The subcutaneous layers approximated with interrupted 0 Vicryl suture more superficial layers with interrupted 2-0 Vicryl sutures and the skin closed with interrupted 4-0 Vicryl sutures. Dermabond was applied then MedPlex bandage. Soft cervical collar placed.  All instrument and sponge counts were correct. Patient was then returned to supine position on her stretcher. Returned to recovery room in satisfactory condition.   Physician assistant's responsibilities: Benjiman Core, PA-C perform the duties of assistant physician and surgeon during this case present from the beginning of the case to the end of the case. He assisted with careful retraction of neural structures suctioning about her elements including cervical cord and C5 nerve root. Performed closure of the incision on the ligamentum nuchae to the skin and application of dressing. He assisted in positioning the patient had removal the patient from the OR table to the stretcher.   Jessy Oto 05/26/2017, 9:51 AM

## 2017-05-26 NOTE — Evaluation (Signed)
Occupational Therapy Evaluation/Discharge Patient Details Name: Jacob Farmer MRN: [id number] DOB: 01/22/1946 Today's Date: 05/26/2017    History of Present Illness Pt is a 71 y/o male s/p R C4-5 foraminotomy with excision of herniated disk. PMH includes MI s/p stent placment, first degree heart block, CKD, HTN, pre-diabetes, and prostate cancer.    Clinical Impression   PTA, pt was independent with assistive devices for ADL and functional mobility. Pt currently requires overall supervision for safety with VC's for adhering to cervical precautions. Educated pt and wife concerning safe shower and toilet transfers with 3-in-1, compensatory strategies for dressing/bathing/toileting tasks, 2 cup method for oral care at sink, brace wear schedule, and cervical precautions during ADL with handout reviewed (previously provided by PT). They verbalize and demonstrate understanding. Pt's wife demonstrates ability to provide necessary assistance. No further acute OT needs identified and no OT follow-up recommended. Recommend initial 24 hour assistance from wife. OT will sign off.     Follow Up Recommendations  No OT follow up;Supervision/Assistance - 24 hour    Equipment Recommendations  3 in 1 bedside commode    Recommendations for Other Services       Precautions / Restrictions Precautions Precautions: Cervical Precaution Comments: Reviewed cervical precautions with pt and family. Handout previously provided by PT. Required Braces or Orthoses: Cervical Brace Cervical Brace: Soft collar Restrictions Weight Bearing Restrictions: No      Mobility Bed Mobility Overal bed mobility: Needs Assistance Bed Mobility: Rolling;Sidelying to Sit;Sit to Sidelying Rolling: Supervision Sidelying to sit: Supervision     Sit to sidelying: Min guard;Supervision General bed mobility comments: Supervision for safety. Verbal cues for use of log roll technique.   Transfers Overall transfer level: Needs  assistance Equipment used: Rolling walker (2 wheeled) Transfers: Sit to/from Stand Sit to Stand: Supervision         General transfer comment: VC's for safety with RW. Supervision for safety.     Balance Overall balance assessment: Needs assistance Sitting-balance support: No upper extremity supported;Feet supported Sitting balance-Leahy Scale: Good     Standing balance support: Bilateral upper extremity supported;During functional activity Standing balance-Leahy Scale: Fair Standing balance comment: Able to statically stand for ADL tasks without UE support.                            ADL either performed or assessed with clinical judgement   ADL Overall ADL's : Needs assistance/impaired Eating/Feeding: Set up;Sitting   Grooming: Supervision/safety;Standing   Upper Body Bathing: Supervision/ safety;Sitting   Lower Body Bathing: Supervison/ safety;Sit to/from stand   Upper Body Dressing : Supervision/safety;Sitting   Lower Body Dressing: Supervision/safety;Sit to/from stand   Toilet Transfer: Supervision/safety;Ambulation;RW   Toileting- Clothing Manipulation and Hygiene: Supervision/safety;Sit to/from stand   Tub/ Shower Transfer: Supervision/safety;Ambulation;3 in 1;Rolling walker;Grab bars   Functional mobility during ADLs: Supervision/safety;Rolling walker General ADL Comments: Pt educated on compensatory strategies for dressing/bathing/toileting/grooming tasks to adhere to back precautions. Educated pt and wife concerning safe shower transfers with 3-in-1.     Vision Patient Visual Report: No change from baseline Vision Assessment?: No apparent visual deficits     Perception     Praxis Praxis Praxis tested?: Within functional limits    Pertinent Vitals/Pain Pain Assessment: 0-10 Pain Score: 2  Pain Location: neck  Pain Descriptors / Indicators: Aching;Sore;Operative site guarding Pain Intervention(s): Limited activity within patient's  tolerance;Monitored during session;Repositioned     Hand Dominance     Extremity/Trunk Assessment Upper Extremity  Assessment Upper Extremity Assessment: Overall WFL for tasks assessed   Lower Extremity Assessment Lower Extremity Assessment: Generalized weakness   Cervical / Trunk Assessment Cervical / Trunk Assessment: Other exceptions Cervical / Trunk Exceptions: s/p neck surgery    Communication Communication Communication: No difficulties   Cognition Arousal/Alertness: Awake/alert Behavior During Therapy: WFL for tasks assessed/performed Overall Cognitive Status: Within Functional Limits for tasks assessed                                     General Comments  Reviewed generalized walking program with pt and family to perform at home     Exercises     Shoulder Instructions      Home Living Family/patient expects to be discharged to:: Private residence Living Arrangements: Spouse/significant other Available Help at Discharge: Family;Available 24 hours/day Type of Home: House Home Access: Stairs to enter [employer] of Steps: 2 Entrance Stairs-Rails: None (wall next to stairs that he uses) Home Layout: One level     Bathroom Shower/Tub: Occupational psychologist: Standard     Home Equipment: Environmental consultant - 4 wheels;Shower seat - built in          Prior Functioning/Environment Level of Independence: Independent with assistive device(s)        Comments: used rollator at baseline         OT Problem List: Impaired balance (sitting and/or standing);Decreased activity tolerance;Decreased safety awareness;Decreased knowledge of use of DME or AE;Decreased knowledge of precautions;Pain      OT Treatment/Interventions:      OT Goals(Current goals can be found in the care plan section) Acute Rehab OT Goals Patient Stated Goal: to go home  OT Goal Formulation: With patient/family Time For Goal Achievement: 06/09/17 Potential to  Achieve Goals: Good  OT Frequency:     Barriers to D/C:            Co-evaluation              AM-PAC PT "6 Clicks" Daily Activity     Outcome Measure Help from another person eating meals?: None Help from another person taking care of personal grooming?: A Little Help from another person toileting, which includes using toliet, bedpan, or urinal?: A Little Help from another person bathing (including washing, rinsing, drying)?: A Little Help from another person to put on and taking off regular upper body clothing?: A Little Help from another person to put on and taking off regular lower body clothing?: A Little 6 Click Score: 19   End of Session Equipment Utilized During Treatment: Gait belt;Rolling walker Nurse Communication: Mobility status;Other (comment) (Pt requesting pain meds)  Activity Tolerance: Patient tolerated treatment well Patient left: in bed;with call bell/phone within reach;with family/visitor present  OT Visit Diagnosis: Other abnormalities of gait and mobility (R26.89);Pain Pain - Right/Left:  (back) Pain - part of body:  (back)                Time: 9485-4627 OT Time Calculation (min): 21 min Charges:  OT General Charges $OT Visit: 1 Procedure OT Evaluation $OT Eval Moderate Complexity: 1 Procedure G-Codes:     Norman Herrlich, MS OTR/L  Pager: [place] A Dasha Kawabata 05/26/2017, 4:11 PM

## 2017-05-26 NOTE — H&P (Signed)
Jacob Farmer is an 71 y.o. male.   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  Progressively worsening symptoms.  Failed conservative treatment.    Past Medical History:  Diagnosis Date  . Arteriosclerotic cardiovascular disease (ASCVD) cardiologist-  dr Roderic Palau branch   Acute myocardial infarction treated with TPA in 1990; 1999-stents to circumflex and RCA; residual total occlusion of the left anterior descending; normal ejection fraction; stress nuclear in 2001-distal anteroseptal ischemia; normal LV function  . Chronic kidney disease, stage 3, mod decreased GFR nephrologist-  at [facility] in [place]   Creatinine-2.18 and 12/2007, 1.76 and 12/2008  . First degree heart block   . History of gastric ulcer   . History of kidney stones    05/23/17- small = no pain- watching  . History of MI (myocardial infarction)    1990-  treated w/ TPA  . Hyperlipidemia   . Hypertension   . Myocardial infarction ([place]) 1990  . OA (osteoarthritis)   . Pre-diabetes   . Prediabetes   . Prostate cancer [facility]) urologist-- dr dahlstedt/  oncologist- dr Tammi Klippel   Stage T1c , Gleason 3+4,  PSA 4.7,  vol 24cc--  scheduled for radiative seed implants  . Psoriatic arthritis ([place])   . RA (rheumatoid arthritis) ([place])    rheumologist-  dr Toni Amend  . S/P drug eluting coronary stent placement    1999--  DES x2 to CFX and RCA  . Sinus bradycardia   . Wears dentures    upper and lower partial    Past Surgical History:  Procedure Laterality Date  . CARDIOVASCULAR STRESS TEST  2001  per Dr Lattie Haw clinic note   distal anteroseptal ischemia,  normal LVF  . COLONOSCOPY  last one 2006 (approx)  . CORONARY ANGIOPLASTY WITH STENT PLACEMENT  1999   DES x2  to CFX and RCA/  residual total occlusion LAD,  normal LVEF  . EYE SURGERY Bilateral    cataract  . RADIOACTIVE SEED IMPLANT N/A 01/11/2016   Procedure: RADIOACTIVE  SEED IMPLANT/BRACHYTHERAPY IMPLANT;  Surgeon: Franchot Gallo, MD;  Location: [facility];  Service: Urology;  Laterality: N/A;   15  seeds implanted no seeds founds in bladder    Family History  Problem Relation Age of Onset  . Heart attack Mother   . Coronary artery disease Father    Social History:  reports that he quit smoking about 28 years ago. His smoking use included Cigarettes. He has a 30.00 pack-year smoking history. He has never used smokeless tobacco. He reports that he drinks about 21.0 oz of alcohol per week . He reports that he does not use drugs.  Allergies:  Allergies  Allergen Reactions  . No Known Allergies     Facility-Administered Medications Prior to Admission  Medication Dose Route Frequency Provider Last Rate Last Dose  . lidocaine (PF) (XYLOCAINE) 1 % injection 0.3 mL  0.3 mL Other Once Magnus Sinning, MD       Medications Prior to Admission  Medication Sig Dispense Refill  . allopurinol (ZYLOPRIM) 100 MG tablet Take 1 tablet (100 mg total) by mouth daily. 90 tablet 1  . amLODipine (NORVASC) 5 MG tablet Take 1 tablet (5 mg total) by mouth daily. (Patient taking differently: Take 5 mg by mouth every morning. ) 90 tablet 3  . aspirin 81 MG tablet Take 81 mg by mouth every other day.     Marland Kitchen  atorvastatin (LIPITOR) 80 MG tablet Take 1 tablet (80 mg total) by mouth daily. (Patient taking differently: Take 80 mg by mouth every evening. ) 90 tablet 3  . B Complex-C (SUPER B COMPLEX PO) Take 1 tablet by mouth every other day.    . baclofen (LIORESAL) 10 MG tablet Take 1 tablet (10 mg total) by mouth every 8 (eight) hours as needed for muscle spasms (Pain). 270 tablet 3  . glimepiride (AMARYL) 1 MG tablet Take 1 mg by mouth daily before breakfast.    . leflunomide (ARAVA) 10 MG tablet Take 1 tablet (10 mg total) by mouth daily. 90 tablet 0  . metoprolol (LOPRESSOR) 50 MG tablet TAKE ONE TABLET BY MOUTH TWICE DAILY 60 tablet 6  . Omega-3 Fatty Acids  (FISH OIL) 1000 MG CAPS Take 1,000 mg by mouth daily.    . tamsulosin (FLOMAX) 0.4 MG CAPS capsule Take 0.4 mg by mouth once a week.       Results for orders placed or performed during the hospital encounter of 05/26/17 (from the past 48 hour(s))  Glucose, capillary     Status: Abnormal   Collection Time: 05/26/17  6:20 AM  Result Value Ref Range   Glucose-Capillary 147 (H) 65 - 99 mg/dL   Comment 1 Notify RN    No results found.  Review of Systems  Constitutional: Negative.   HENT: Negative.   Cardiovascular: Negative.   Gastrointestinal: Negative.   Genitourinary: Negative.   Musculoskeletal: Positive for neck pain.  Skin: Negative.   Neurological: Positive for tingling.  Psychiatric/Behavioral: Negative.     Blood pressure 136/72, pulse (!) 51, temperature 98.4 F (36.9 C), resp. rate 20, SpO2 99 %. Physical Exam  Constitutional: He is oriented to person, place, and time. No distress.  HENT:  Head: Normocephalic and atraumatic.  Eyes: EOM are normal. Pupils are equal, round, and reactive to light.  Neck:  Decreased ROM.  Right brachial plexus tenderness.   Respiratory: No respiratory distress.  GI: He exhibits no distension.  Musculoskeletal:  Back Exam   Tenderness  The patient is experiencing tenderness in the cervical.  Range of Motion  Extension: abnormal  Flexion: normal  Lateral Bend Right: normal  Lateral Bend Left: normal  Rotation Right: normal   Tests  Straight leg raise right: negative Straight leg raise left: negative  Reflexes  Biceps:  0/4 abnormal Babinski's sign: abnormal   Other  Toe Walk: normal Heel Walk: normal Gait: normal   Comments:  Right shoulder abduction 4/5, right biceps 5-/5, shoulder ROM and impingement testing is normal and pain free   Neurological: He is alert and oriented to person, place, and time.  Skin: Skin is warm and dry.  Psychiatric: He has a normal mood and affect.     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  Surgical procedure along with possible risks and complications discussed.  All questions answered.       Benjiman Core, PA-C 05/26/2017, 6:52 AM

## 2017-05-26 NOTE — Transfer of Care (Signed)
Immediate Anesthesia Transfer of Care Note  Patient: Jacob Farmer  Procedure(s) Performed: Procedure(s): RIGHT C4-5 FORAMINOTOMY WITH EXCISION OF HERNIATED DISC (N/A)  Patient Location: PACU  Anesthesia Type:General  Level of Consciousness: awake, alert  and oriented  Airway & Oxygen Therapy: Patient Spontanous Breathing and Patient connected to nasal cannula oxygen  Post-op Assessment: Report given to RN, Post -op Vital signs reviewed and stable, Patient moving all extremities and Patient moving all extremities X 4  Post vital signs: Reviewed and stable  Last Vitals:  Vitals:   05/26/17 0623  BP: 136/72  Pulse: (!) 51  Resp: 20  Temp: 36.9 C    Last Pain:  Vitals:   05/26/17 0703  PainSc: 5       Patients Stated Pain Goal: 3 (69/50/72 2575)  Complications: No apparent anesthesia complications

## 2017-05-26 NOTE — Anesthesia Procedure Notes (Signed)
Procedure Name: Intubation Date/Time: 05/26/2017 7:41 AM Performed by: Neldon Newport Pre-anesthesia Checklist: Patient identified, Emergency Drugs available, Suction available and Patient being monitored Patient Re-evaluated:Patient Re-evaluated prior to inductionOxygen Delivery Method: Circle System Utilized Preoxygenation: Pre-oxygenation with 100% oxygen Intubation Type: IV induction Ventilation: Mask ventilation without difficulty Laryngoscope Size: Mac and 4 Grade View: Grade I Tube type: Oral Tube size: 7.5 mm Number of attempts: 1 Airway Equipment and Method: Stylet and Oral airway Placement Confirmation: ETT inserted through vocal cords under direct vision,  positive ETCO2 and breath sounds checked- equal and bilateral Secured at: 22 (lip) cm Tube secured with: Tape Dental Injury: Teeth and Oropharynx as per pre-operative assessment

## 2017-05-26 NOTE — Anesthesia Postprocedure Evaluation (Signed)
Anesthesia Post Note  Patient: Jacob Farmer  Procedure(s) Performed: Procedure(s) (LRB): RIGHT C4-5 FORAMINOTOMY WITH EXCISION OF HERNIATED DISC (N/A)     Patient location during evaluation: PACU Anesthesia Type: General Level of consciousness: sedated and patient cooperative Pain management: pain level controlled Vital Signs Assessment: post-procedure vital signs reviewed and stable Respiratory status: spontaneous breathing Cardiovascular status: stable Anesthetic complications: no    Last Vitals:  Vitals:   05/26/17 1115 05/26/17 1143  BP:  134/69  Pulse: 69 70  Resp: 11 16  Temp: 36.6 C 36.7 C    Last Pain:  Vitals:   05/26/17 1445  PainSc: [place]

## 2017-05-26 NOTE — Interval H&P Note (Signed)
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 After consideration of risks, benefits and other options for treatment, the patient has consented to  Procedure(s): RIGHT C4-5 FORAMINOTOMY WITH EXCISION OF HERNIATED DISC (N/A) as a surgical intervention .  The patient's history has been reviewed, patient examined, no change in status, stable for surgery.  I have reviewed the patient's chart and labs.  Questions were answered to the patient's satisfaction.     Jessy Oto

## 2017-05-26 NOTE — Brief Op Note (Signed)
05/26/2017  9:47 AM  PATIENT:  Jacob Farmer  71 y.o. 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  COUNTS:  YES  TOURNIQUET:  * No tourniquets in log *  DICTATION: .Dragon Dictation  PLAN OF CARE: Admit for overnight observation  PATIENT DISPOSITION:  PACU - hemodynamically stable.   Delay start of Pharmacological VTE agent (>24hrs) due to surgical blood loss or risk of bleeding: yes

## 2017-05-26 NOTE — Evaluation (Signed)
Physical Therapy Evaluation Patient Details Name: Jacob Farmer MRN: [id number] DOB: 1946/06/26 Today's Date: 05/26/2017   History of Present Illness  Pt is a 71 y/o male s/p R C4-5 foraminotomy with excision of herniated disk. PMH includes MI s/p stent placment, first degree heart block, CKD, HTN, pre-diabetes, and prostate cancer.   Clinical Impression  Pt is s/p surgery above with deficits below. PTA, pt was using Rollator for ambulation. Upon evaluation, pt with decreased steadiness and strength and required use of RW for mobility. Pt requiring min guard for mobility this session. Pt's wife reporting pt has increased steadiness with use of RW and would like to have one for home to increase safety. Recommending RW at d/c, and no follow up PT recommended. Will continue to follow acutely to maximize functional mobility independence.      Follow Up Recommendations No PT follow up;Supervision/Assistance - 24 hour    Equipment Recommendations  Rolling walker with 5" wheels    Recommendations for Other Services       Precautions / Restrictions Precautions Precautions: Cervical Precaution Comments: Reviewed cervical precautions with pt and family.  Required Braces or Orthoses: Cervical Brace Cervical Brace: Soft collar Restrictions Weight Bearing Restrictions: No      Mobility  Bed Mobility Overal bed mobility: Needs Assistance Bed Mobility: Rolling;Sidelying to Sit;Sit to Sidelying Rolling: Supervision Sidelying to sit: Min guard     Sit to sidelying: Min guard General bed mobility comments: Min guard for safety. Verbal cues for use of log roll technique.   Transfers Overall transfer level: Needs assistance Equipment used: Rolling walker (2 wheeled) Transfers: Sit to/from Stand Sit to Stand: Min guard         General transfer comment: Verbal cues for safe hand placement. Min guard for safety.   Ambulation/Gait Ambulation/Gait assistance: Min guard Ambulation  Distance (Feet): 200 Feet Assistive device: Rolling walker (2 wheeled) Gait Pattern/deviations: Step-through pattern;Decreased stride length;Drifts right/left;Trunk flexed Gait velocity: Decreased Gait velocity interpretation: Below normal speed for age/gender General Gait Details: Slow, guarded gait. Mild unsteadiness; no LOB noted. Verbal cues for upright posture during gait.   Stairs            Wheelchair Mobility    Modified Rankin (Stroke Patients Only)       Balance Overall balance assessment: Needs assistance Sitting-balance support: No upper extremity supported;Feet supported Sitting balance-Leahy Scale: Good     Standing balance support: Bilateral upper extremity supported;During functional activity Standing balance-Leahy Scale: Poor Standing balance comment: Reliant on RW for stability                              Pertinent Vitals/Pain Pain Assessment: 0-10 Pain Score: 2  Pain Location: neck  Pain Descriptors / Indicators: Aching;Sore;Operative site guarding Pain Intervention(s): Limited activity within patient's tolerance;Monitored during session;Repositioned    Home Living Family/patient expects to be discharged to:: Private residence Living Arrangements: Spouse/significant other Available Help at Discharge: Family;Available 24 hours/day Type of Home: House Home Access: Stairs to enter Entrance Stairs-Rails: None (wall next to stairs that he uses ) Entrance Stairs-Number of Steps: 2 Home Layout: One level Home Equipment: Walker - 4 wheels;Shower seat - built in      Prior Function Level of Independence: Independent with assistive device(s)         Comments: used rollator at baseline      Hand Dominance        Extremity/Trunk Assessment  Upper Extremity Assessment Upper Extremity Assessment: Defer to OT evaluation    Lower Extremity Assessment Lower Extremity Assessment: Generalized weakness (Grossly 3+/5 throughout )     Cervical / Trunk Assessment Cervical / Trunk Assessment: Other exceptions Cervical / Trunk Exceptions: s/p neck surgery   Communication   Communication: No difficulties  Cognition Arousal/Alertness: Awake/alert Behavior During Therapy: WFL for tasks assessed/performed Overall Cognitive Status: Within Functional Limits for tasks assessed                                        General Comments General comments (skin integrity, edema, etc.): Reviewed generalized walking program with pt and family to perform at home     Exercises     Assessment/Plan    PT Assessment Patient needs continued PT services  PT Problem List Decreased strength;Decreased balance;Decreased mobility;Decreased knowledge of use of DME;Decreased knowledge of precautions       PT Treatment Interventions DME instruction;Stair training;Gait training;Functional mobility training;Therapeutic activities;Therapeutic exercise;Balance training;Neuromuscular re-education;Patient/family education    PT Goals (Current goals can be found in the Care Plan section)  Acute Rehab PT Goals Patient Stated Goal: to go home  PT Goal Formulation: With patient/family Time For Goal Achievement: 06/02/17 Potential to Achieve Goals: Good    Frequency Min 5X/week   Barriers to discharge        Co-evaluation               AM-PAC PT "6 Clicks" Daily Activity  Outcome Measure Difficulty turning over in bed (including adjusting bedclothes, sheets and blankets)?: A Little Difficulty moving from lying on back to sitting on the side of the bed? : Total Difficulty sitting down on and standing up from a chair with arms (e.g., wheelchair, bedside commode, etc,.)?: Total Help needed moving to and from a bed to chair (including a wheelchair)?: A Little Help needed walking in hospital room?: A Little Help needed climbing 3-5 steps with a railing? : A Little 6 Click Score: 14    End of Session Equipment Utilized  During Treatment: Gait belt;Cervical collar Activity Tolerance: Patient tolerated treatment well Patient left: in bed;with call bell/phone within reach;with family/visitor present Nurse Communication: Mobility status PT Visit Diagnosis: Unsteadiness on feet (R26.81);Other symptoms and signs involving the nervous system (R29.898)    Time: 7622-6333 PT Time Calculation (min) (ACUTE ONLY): 19 min   Charges:   PT Evaluation $PT Eval Low Complexity: 1 Procedure PT Treatments $Gait Training: 8-22 mins   PT G Codes:        Nicky Pugh, PT, DPT  Acute Rehabilitation Services  Pager: [phone]   Army Melia 05/26/2017, 3:11 PM

## 2017-05-27 DIAGNOSIS — N183 Chronic kidney disease, stage 3 (moderate): Secondary | ICD-10-CM | POA: Diagnosis not present

## 2017-05-27 DIAGNOSIS — M4802 Spinal stenosis, cervical region: Secondary | ICD-10-CM | POA: Diagnosis not present

## 2017-05-27 DIAGNOSIS — E785 Hyperlipidemia, unspecified: Secondary | ICD-10-CM | POA: Diagnosis not present

## 2017-05-27 DIAGNOSIS — I2510 Atherosclerotic heart disease of native coronary artery without angina pectoris: Secondary | ICD-10-CM | POA: Diagnosis not present

## 2017-05-27 DIAGNOSIS — M069 Rheumatoid arthritis, unspecified: Secondary | ICD-10-CM | POA: Diagnosis not present

## 2017-05-27 DIAGNOSIS — M50221 Other cervical disc displacement at C4-C5 level: Secondary | ICD-10-CM | POA: Diagnosis not present

## 2017-05-27 DIAGNOSIS — M79601 Pain in right arm: Secondary | ICD-10-CM | POA: Diagnosis not present

## 2017-05-27 DIAGNOSIS — R7303 Prediabetes: Secondary | ICD-10-CM | POA: Diagnosis not present

## 2017-05-27 DIAGNOSIS — I129 Hypertensive chronic kidney disease with stage 1 through stage 4 chronic kidney disease, or unspecified chronic kidney disease: Secondary | ICD-10-CM | POA: Diagnosis not present

## 2017-05-27 DIAGNOSIS — I252 Old myocardial infarction: Secondary | ICD-10-CM | POA: Diagnosis not present

## 2017-05-27 LAB — GLUCOSE, CAPILLARY
Glucose-Capillary: 137 mg/dL — ABNORMAL HIGH (ref 65–99)
Glucose-Capillary: 95 mg/dL (ref 65–99)

## 2017-05-27 LAB — HEMOGLOBIN A1C
Hgb A1c MFr Bld: 6.1 % — ABNORMAL HIGH (ref 4.8–5.6)
Mean Plasma Glucose: 128 mg/dL

## 2017-05-27 MED ORDER — PANTOPRAZOLE SODIUM 40 MG PO TBEC: 40.0000 mg | DELAYED_RELEASE_TABLET | Freq: Every day | ORAL | Status: DC

## 2017-05-27 MED ORDER — BACLOFEN 10 MG PO TABS: 10.0000 mg | ORAL_TABLET | Freq: Three times a day (TID) | ORAL | 1 refills | Status: DC | PRN

## 2017-05-27 MED ORDER — HYDROCODONE-ACETAMINOPHEN 5-325 MG PO TABS: 1.0000 | ORAL_TABLET | ORAL | 0 refills | Status: DC | PRN

## 2017-05-27 NOTE — Progress Notes (Signed)
Physical Therapy Treatment Patient Details Name: Jacob Farmer MRN: [id number] DOB: 10/24/1946 Today's Date: 05/27/2017    History of Present Illness Pt is a 71 y/o male s/p R C4-5 foraminotomy with excision of herniated disk. PMH includes MI s/p stent placment, first degree heart block, CKD, HTN, pre-diabetes, and prostate cancer.     PT Comments    Pt progressing well with mobility. Has met PT goals this session and is d/c from acute PT services. Pt reports readiness to return home today. Will have adequate assistance available upon d/c. Will sign off at this time. If needs change, please reconsult.    Follow Up Recommendations  No PT follow up;Supervision/Assistance - 24 hour     Equipment Recommendations  Rolling walker with 5" wheels;3in1 (PT)    Recommendations for Other Services       Precautions / Restrictions Precautions Precautions: Cervical Precaution Comments: Pt was cued for precautions during functional mobility.  Required Braces or Orthoses: Cervical Brace Cervical Brace: Soft collar;For comfort Restrictions Weight Bearing Restrictions: No    Mobility  Bed Mobility               General bed mobility comments: Pt received standing in room with wife dressing  Transfers Overall transfer level: Modified independent Equipment used: Rolling walker (2 wheeled) Transfers: Sit to/from Stand           General transfer comment: No unsteadiness noted, no assist required.   Ambulation/Gait Ambulation/Gait assistance: Modified independent (Device/Increase time) Ambulation Distance (Feet): 300 Feet Assistive device: Rolling walker (2 wheeled) Gait Pattern/deviations: Step-through pattern;Trunk flexed Gait velocity: Decreased Gait velocity interpretation: Below normal speed for age/gender General Gait Details: Slow but steady. No assist required.    Stairs Stairs: Yes   Stair Management: One rail Right;Step to pattern;Forwards Number of Stairs:  4 General stair comments: VC's for sequencing and general safety.   Wheelchair Mobility    Modified Rankin (Stroke Patients Only)       Balance Overall balance assessment: Needs assistance Sitting-balance support: No upper extremity supported;Feet supported Sitting balance-Leahy Scale: Good     Standing balance support: Bilateral upper extremity supported;During functional activity Standing balance-Leahy Scale: Fair Standing balance comment: Able to statically stand for ADL tasks without UE support.                             Cognition Arousal/Alertness: Awake/alert Behavior During Therapy: WFL for tasks assessed/performed Overall Cognitive Status: Within Functional Limits for tasks assessed                                        Exercises      General Comments        Pertinent Vitals/Pain Pain Assessment: 0-10 Pain Score: 0-No pain Pain Intervention(s): Monitored during session    Home Living                      Prior Function            PT Goals (current goals can now be found in the care plan section) Acute Rehab PT Goals Patient Stated Goal: to go home  PT Goal Formulation: With patient/family Time For Goal Achievement: 06/02/17 Potential to Achieve Goals: Good Progress towards PT goals: Progressing toward goals    Frequency    Min 5X/week  PT Plan Current plan remains appropriate    Co-evaluation              AM-PAC PT "6 Clicks" Daily Activity  Outcome Measure  Difficulty turning over in bed (including adjusting bedclothes, sheets and blankets)?: None Difficulty moving from lying on back to sitting on the side of the bed? : None Difficulty sitting down on and standing up from a chair with arms (e.g., wheelchair, bedside commode, etc,.)?: None Help needed moving to and from a bed to chair (including a wheelchair)?: None Help needed walking in hospital room?: None Help needed climbing 3-5  steps with a railing? : A Little 6 Click Score: 23    End of Session Equipment Utilized During Treatment: Gait belt;Cervical collar Activity Tolerance: Patient tolerated treatment well Patient left: in chair;with call bell/phone within reach;with family/visitor present Nurse Communication: Mobility status PT Visit Diagnosis: Unsteadiness on feet (R26.81);Other symptoms and signs involving the nervous system (S04.471)     Time: 5806-3868 PT Time Calculation (min) (ACUTE ONLY): 16 min  Charges:  $Gait Training: 8-22 mins                    G Codes:       Rolinda Roan, PT, DPT Acute Rehabilitation Services Pager: [phone]    Thelma Comp 05/27/2017, 8:28 AM

## 2017-05-27 NOTE — Progress Notes (Signed)
Patient alert and oriented, mae's well, voiding adequate amount of urine, swallowing without difficulty, no c/o pain at time of discharge. Patient discharged home with family. Script and discharged instructions given to patient. Patient and family stated understanding of instructions given. Patient has an appointment with Dr. Flavia Shipper

## 2017-05-27 NOTE — Care Management Obs Status (Signed)
[facility] NOTIFICATION   Patient Details  Name: Jacob Farmer MRN: [id number] Date of Birth: 1946/01/04   Medicare Observation Status Notification Given:  Yes    Ninfa Meeker, RN 05/27/2017, 8:52 AM

## 2017-05-27 NOTE — Care Management CC44 (Signed)
Condition Code 44 Documentation Completed  Patient Details  Name: Jacob Farmer MRN: [id number] Date of Birth: 1946-01-13   Condition Code 44 given:  Yes Patient signature on Condition Code 44 notice:  Yes Documentation of 2 MD's agreement:  Yes Code 44 added to claim:  Yes   Dr. Louanne Skye intended for patient to be listed as outpatient, inpatient was entered in error. CM explained reason for Code 44 to patient and his wife.     Ninfa Meeker, RN 05/27/2017, 8:52 AM

## 2017-05-27 NOTE — Progress Notes (Signed)
     Subjective: 1 Day Post-Op Procedure(s) (LRB): RIGHT C4-5 FORAMINOTOMY WITH EXCISION OF HERNIATED DISC (N/A) Awake, alert and oriented x 4. Pain in improved, no right arm pain. Ready to go home. Patient reports pain as mild.    Objective:   VITALS:  Temp:  [97.6 F (36.4 C)-98.1 F (36.7 C)] 97.8 F (36.6 C) (06/12 0429) Pulse Rate:  [61-99] 61 (06/12 0429) Resp:  [10-19] 18 (06/12 0429) BP: (117-139)/(65-76) 117/65 (06/12 0429) SpO2:  [91 %-100 %] 97 % (06/12 0429)  Neurologically intact ABD soft Neurovascular intact Sensation intact distally Intact pulses distally Dorsiflexion/Plantar flexion intact Incision: no drainage   LABS  Recent Labs  05/26/17 0644  HGB 12.9*  WBC 7.9  PLT 142*    Recent Labs  05/26/17 0644  NA 139  K 4.0  CL 109  CO2 23  BUN 15  CREATININE 1.19  GLUCOSE 138*    Recent Labs  05/26/17 0644  INR 1.04     Assessment/Plan: 1 Day Post-Op Procedure(s) (LRB): RIGHT C4-5 FORAMINOTOMY WITH EXCISION OF HERNIATED DISC (N/A)  Advance diet Up with therapy Discharge home with home health  Jessy Oto 05/27/2017, 7:32 AM Patient ID: Jacob Farmer, male   DOB: 24-Feb-1946, 71 y.o.   MRN: [id number]

## 2017-05-27 NOTE — Discharge Summary (Signed)
Physician Discharge Summary      Patient ID: Jacob Farmer MRN: [id number] DOB/AGE: January 12, 1946 71 y.o.  Admit date: 05/26/2017 Discharge date: 05/27/2017  Admission Diagnoses:  Principal Problem:   Herniated cervical disc Active Problems:   Herniation of cervical intervertebral disc with radiculopathy   Cervical spinal stenosis   Discharge Diagnoses:  Same  Past Medical History:  Diagnosis Date  . Arteriosclerotic cardiovascular disease (ASCVD) cardiologist-  dr Roderic [facility] [facility]   Acute myocardial infarction treated with TPA in 1990; 1999-stents to circumflex and RCA; residual total occlusion of the left anterior descending; normal ejection fraction; stress nuclear in 2001-distal anteroseptal ischemia; normal LV function  . Chronic kidney disease, stage 3, mod decreased GFR nephrologist-  at [facility] in [place]   Creatinine-2.18 and 12/2007, 1.76 and 12/2008  . First degree heart block   . History of gastric ulcer   . History of kidney stones    05/23/17- small = no pain- watching  . History of MI (myocardial infarction)    1990-  treated w/ TPA  . Hyperlipidemia   . Hypertension   . Myocardial infarction ([place]) 1990  . OA (osteoarthritis)   . Pre-diabetes   . Prediabetes   . Prostate cancer [facility]) urologist-- dr dahlstedt/  oncologist- dr Tammi Klippel   Stage T1c , Gleason 3+4,  PSA 4.7,  vol 24cc--  scheduled for radiative seed implants  . Psoriatic arthritis ([place])   . RA (rheumatoid arthritis) ([place])    rheumologist-  dr Toni Amend  . S/P drug eluting coronary stent placement    1999--  DES x2 to CFX and RCA  . Sinus bradycardia   . Wears dentures    upper and lower partial    Surgeries: Procedure(s): RIGHT C4-5 FORAMINOTOMY WITH EXCISION OF HERNIATED DISC on 05/26/2017   Consultants:   Discharged Condition: Improved  Hospital Course: Jacob Farmer is an 71 y.o. male who was admitted 05/26/2017 with a chief complaint of No chief complaint on file. , and found to  have a diagnosis of Herniated cervical disc.  He was brought to the operating room on 05/26/2017 and underwent the above named procedures.    He was given perioperative antibiotics:  Anti-infectives    Start     Dose/Rate Route Frequency Ordered Stop   05/26/17 1600  ceFAZolin (ANCEF) IVPB 2g/100 mL premix     2 g 200 mL/hr over 30 Minutes Intravenous Every 8 hours 05/26/17 1132 05/26/17 2358   05/26/17 0635  ceFAZolin (ANCEF) IVPB 2g/100 mL premix     2 g 200 mL/hr over 30 Minutes Intravenous On call to O.R. 05/26/17 6962 05/26/17 0738    He recovered in the PACU unevenfully and was transferred to Med-Surg floor 3 C bed #4. His pain levels post operatively Remained low 2 of 10 and he did not require a great deal of narcotic medications. His vital signs remained stable and he was able to void without  Difficulty. On morning of POD#1 he was alert and oriented x 4. His dressing dry and changed to a mepilex bandaid. All questions answered. Soft  Collar is as needed for discomfort. UE motor was improved to 5-/5  In right shoulder abduction, biceps remained 5/5. Sensation was normal and pain  Due to radiculopathy was absent. He was standing and walking with stand by independently. He was discharged home on POD#1. Due to preop  Listing of this patient as an in patient procedure a form CODE44 was required and completed with this  patient. He was awake,alert and oriented and stable. Satisfactory for discharge.   He was given sequential compression devices, early ambulation, and chemoprophylaxis of aspirin for DVT prophylaxis.  He benefited maximally from their hospital stay and there were no complications.    Recent vital signs:  Vitals:   05/27/17 0429 05/27/17 0807  BP: 117/65 128/68  Pulse: 61 (!) 58  Resp: 18 16  Temp: 97.8 F (36.6 C) 97.8 F (36.6 C)    Recent laboratory studies:  Results for orders placed or performed during the hospital encounter of 05/26/17  Surgical pcr screen    Result Value Ref Range   MRSA, PCR NEGATIVE NEGATIVE   Staphylococcus aureus NEGATIVE NEGATIVE  Glucose, capillary  Result Value Ref Range   Glucose-Capillary 147 (H) 65 - 99 mg/dL   Comment 1 Notify RN   CBC  Result Value Ref Range   WBC 7.9 4.0 - 10.5 K/uL   RBC 4.29 4.22 - 5.81 MIL/uL   Hemoglobin 12.9 (L) 13.0 - 17.0 g/dL   HCT 39.1 39.0 - 52.0 %   MCV 91.1 78.0 - 100.0 fL   MCH 30.1 26.0 - 34.0 pg   MCHC 33.0 30.0 - 36.0 g/dL   RDW 14.6 11.5 - 15.5 %   Platelets 142 (L) 150 - 400 K/uL  Comprehensive metabolic panel  Result Value Ref Range   Sodium 139 135 - 145 mmol/L   Potassium 4.0 3.5 - 5.1 mmol/L   Chloride 109 101 - 111 mmol/L   CO2 23 22 - 32 mmol/L   Glucose, Bld 138 (H) 65 - 99 mg/dL   BUN 15 6 - 20 mg/dL   Creatinine, Ser 1.19 0.61 - 1.24 mg/dL   Calcium 8.6 (L) 8.9 - 10.3 mg/dL   Total Protein 6.0 (L) 6.5 - 8.1 g/dL   Albumin 3.1 (L) 3.5 - 5.0 g/dL   AST 18 15 - 41 U/L   ALT 20 17 - 63 U/L   Alkaline Phosphatase 83 38 - 126 U/L   Total Bilirubin 0.9 0.3 - 1.2 mg/dL   GFR calc non Af Amer 59 (L) >60 mL/min   GFR calc Af Amer >60 >60 mL/min   Anion gap 7 5 - 15  Protime-INR  Result Value Ref Range   Prothrombin Time 13.6 11.4 - 15.2 seconds   INR 1.04   APTT  Result Value Ref Range   aPTT 25 24 - 36 seconds  Glucose, capillary  Result Value Ref Range   Glucose-Capillary 146 (H) 65 - 99 mg/dL   Comment 1 Notify RN    Comment 2 Document in Chart   Hemoglobin A1c  Result Value Ref Range   Hgb A1c MFr Bld 6.1 (H) 4.8 - 5.6 %   Mean Plasma Glucose 128 mg/dL  Glucose, capillary  Result Value Ref Range   Glucose-Capillary 182 (H) 65 - 99 mg/dL   Comment 1 Notify RN    Comment 2 Document in Chart   Glucose, capillary  Result Value Ref Range   Glucose-Capillary 224 (H) 65 - 99 mg/dL   Comment 1 Notify RN    Comment 2 Document in Chart   Glucose, capillary  Result Value Ref Range   Glucose-Capillary 167 (H) 65 - 99 mg/dL   Comment 1 Notify RN     Comment 2 Document in Chart   Glucose, capillary  Result Value Ref Range   Glucose-Capillary 137 (H) 65 - 99 mg/dL   Comment 1 Notify RN  Comment 2 Document in Chart   Glucose, capillary  Result Value Ref Range   Glucose-Capillary 95 65 - 99 mg/dL    Discharge Medications:   Allergies as of 05/27/2017      Reactions   No Known Allergies       Medication List    TAKE these medications   allopurinol 100 MG tablet Commonly known as:  ZYLOPRIM Take 1 tablet (100 mg total) by mouth daily.   amLODipine 5 MG tablet Commonly known as:  NORVASC Take 1 tablet (5 mg total) by mouth daily. What changed:  when to take this   aspirin 81 MG tablet Take 81 mg by mouth every other day.   atorvastatin 80 MG tablet Commonly known as:  LIPITOR Take 1 tablet (80 mg total) by mouth daily. What changed:  when to take this   baclofen 10 MG tablet Commonly known as:  LIORESAL Take 1 tablet (10 mg total) by mouth every 8 (eight) hours as needed for muscle spasms (Pain). What changed:  Another medication with the same name was added. Make sure you understand how and when to take each.   baclofen 10 MG tablet Commonly known as:  LIORESAL Take 1 tablet (10 mg total) by mouth every 8 (eight) hours as needed for muscle spasms (Pain). What changed:  You were already taking a medication with the same name, and this prescription was added. Make sure you understand how and when to take each.   Fish Oil 1000 MG Caps Take 1,000 mg by mouth daily.   glimepiride 1 MG tablet Commonly known as:  AMARYL Take 1 mg by mouth daily before breakfast.   HYDROcodone-acetaminophen 5-325 MG tablet Commonly known as:  NORCO/VICODIN Take 1-2 tablets by mouth every 4 (four) hours as needed (breakthrough pain).   leflunomide 10 MG tablet Commonly known as:  ARAVA Take 1 tablet (10 mg total) by mouth daily.   metoprolol tartrate 50 MG tablet Commonly known as:  LOPRESSOR TAKE ONE TABLET BY MOUTH TWICE  DAILY   SUPER B COMPLEX PO Take 1 tablet by mouth every other day.   tamsulosin 0.4 MG Caps capsule Commonly known as:  FLOMAX Take 0.4 mg by mouth once a week.            Durable Medical Equipment        Start     Ordered   05/27/17 0840  For home use only DME 3 n 1  Once     05/27/17 0841   05/27/17 0840  For home use only DME Walker rolling  Once    Question:  Patient needs a walker to treat with the following condition  Answer:  HNP (herniated nucleus pulposus), cervical   05/27/17 0841      Diagnostic Studies: Dg Chest 2 View  Result Date: 05/26/2017 CLINICAL DATA:  Preoperative exam for cervical spine surgery. EXAM: CHEST  2 VIEW COMPARISON:  11/17/2015 FINDINGS: Heart size is normal. Mediastinal shadows are normal. The lungs are clear. No bronchial thickening. No infiltrate, mass, effusion or collapse. Pulmonary vascularity is normal. No bony abnormality. IMPRESSION: Normal chest Electronically Signed   By: Nelson Chimes M.D.   On: 05/26/2017 07:02   Dg Cervical Spine 1 View  Result Date: 05/26/2017 CLINICAL DATA:  Right C4-5 foraminotomy EXAM: CERVICAL SPINE 1 VIEW COMPARISON:  None. FINDINGS: Single cross-table lateral x-ray of the cervical spine is provided. Posterior metallic instrument projecting over the C4 spinous process. IMPRESSION: Intraoperative localization as described  above. Electronically Signed   By: Kathreen Devoid   On: 05/26/2017 12:17   Mr Cervical Spine W/o Contrast  Result Date: 05/05/2017 CLINICAL DATA:  71 year old male with cervical neck pain radiating to the right arm with numbness for 3 months. No known injury. EXAM: MRI CERVICAL SPINE WITHOUT CONTRAST TECHNIQUE: Multiplanar, multisequence MR imaging of the cervical spine was performed. No intravenous contrast was administered. COMPARISON:  Cervical spine radiographs 04/10/2017. FINDINGS: Alignment: Stable with mild straightening and reversal of cervical lordosis. Vertebrae: No marrow edema or  evidence of acute osseous abnormality. Cord: Spinal cord signal is within normal limits at all visualized levels. Posterior Fossa, vertebral arteries, paraspinal tissues: Negative visualized posterior fossa. Preserved major vascular flow voids in the neck, the left vertebral artery is dominant. Negative neck soft tissues. Disc levels: C2-C3: Trace anterolisthesis. Moderate to severe right facet hypertrophy. Mild endplate spurring. No spinal stenosis. Mild right C3 foraminal stenosis. C3-C4: Disc space loss. Mostly anterior disc osteophyte complex. Mild to moderate facet hypertrophy. Left greater than right foraminal disc and/or endplate spurring. No spinal stenosis. Severe left and moderate to severe right C4 foraminal stenosis. C4-C5: Broad-based right foraminal disc extrusion (series 5, image 5 and series 7, image 12). Superimposed mild disc bulging and endplate spurring. Broad-based posterior component of disc. Borderline to mild spinal stenosis, no spinal cord mass effect. Mild facet hypertrophy. Severe right C5 foraminal stenosis. Mild left foraminal stenosis. C5-C6: Circumferential disc bulge and endplate spurring with mildly lobulated left paracentral broad-based posterior component (series 7, image 17). Right greater than left foraminal endplate spurring. Borderline to mild spinal stenosis, no spinal cord mass effect. Mild left and moderate right C6 foraminal stenosis. C6-C7: Anterior and left eccentric disc osteophyte complex. No spinal stenosis. Moderate to severe left and mild right C7 foraminal stenosis related to disc and/or endplate spurring. C7-T1: Mild to moderate facet hypertrophy greater on the left. Mild endplate spurring. Borderline to mild left C8 foraminal stenosis. No upper thoracic spinal stenosis. IMPRESSION: 1. Symptomatic level favored to be C4-C5 where right foraminal disc herniation contributes to severe right neural foraminal stenosis and mild spinal stenosis. Query right C4  radiculitis. 2. Mild spinal stenosis and moderate right neural foraminal stenosis at C5-C6 related to disc and endplate degeneration. 3. C6-C7 moderate to severe left foraminal stenosis related to disc and endplate degeneration. 4. Moderate to severe right side facet degeneration at C2-C3 with mild right foraminal stenosis. Electronically Signed   By: Genevie Ann M.D.   On: 05/05/2017 13:52    Disposition: 01-Home or Self Care  Discharge Instructions    Call MD / Call 911    Complete by:  As directed    If you experience chest pain or shortness of breath, CALL 911 and be transported to the hospital emergency room.  If you develope a fever above 101 F, pus (white drainage) or increased drainage or redness at the wound, or calf pain, call your surgeon's office.   Constipation Prevention    Complete by:  As directed    Drink plenty of fluids.  Prune juice may be helpful.  You may use a stool softener, such as Colace (over the counter) 100 mg twice a day.  Use MiraLax (over the counter) for constipation as needed.   Diet Carb Modified    Complete by:  As directed    Discharge instructions    Complete by:  As directed    No lifting greater than 10 lbs. Avoid bending, stooping and twisting. Walking  in house for first week then may start to get out slowly increasing activity using arms. Keep incision dry for 3 days, may use tegaderm or similar water impervious dressing. Avoid overhead use of arms and overhead lifting. Wear collar for comfort. Use ice as needed for comfort.   Driving restrictions    Complete by:  As directed    No driving for 3 weeks   Increase activity slowly as tolerated    Complete by:  As directed    Lifting restrictions    Complete by:  As directed    No lifting for 6 weeks      Follow-up Information    Jessy Oto, MD Follow up in 2 week(s).   Specialty:  Orthopedic Surgery Why:  For wound re-check Contact information: [place]  [address] [phone]            Signed: Jessy Oto 05/27/2017, 4:03 PM

## 2017-05-28 ENCOUNTER — Encounter (HOSPITAL_COMMUNITY): Payer: Self-pay | Admitting: Specialist

## 2017-05-28 MED FILL — Thrombin For Soln 20000 Unit: CUTANEOUS | Qty: 1 | Status: AC

## 2017-05-28 NOTE — Progress Notes (Signed)
   05/26/17 1502  PT G-Codes **NOT FOR INPATIENT CLASS**  Functional Assessment Tool Used AM-PAC 6 Clicks Basic Mobility;Clinical judgement  Functional Limitation Mobility: Walking and moving around  Mobility: Walking and Moving Around Current Status (Z9688) CK  Mobility: Walking and Moving Around Goal Status (A4847) CI   G-Code Insertion  Nicky Pugh, PT, DPT  Acute Rehabilitation Services  Pager: [phone]

## 2017-05-29 DIAGNOSIS — R269 Unspecified abnormalities of gait and mobility: Secondary | ICD-10-CM | POA: Diagnosis not present

## 2017-06-03 ENCOUNTER — Other Ambulatory Visit: Payer: Self-pay | Admitting: Rheumatology

## 2017-06-03 NOTE — Telephone Encounter (Signed)
Last Visit: 02/05/17 Next Visit: 07/07/17 Labs: 04/15/17 CBC WNL CMP- Creat 1.31 GFR 54  Okay to refill Arava?

## 2017-06-06 NOTE — Progress Notes (Addendum)
Late entry for missed G-code for OT evaluation Jun 03, 2017.   2017-06-03 1600  OT G-codes **NOT FOR INPATIENT CLASS**  Functional Assessment Tool Used Clinical judgement  Functional Limitation Self care  Self Care Current Status (O6002) CI  Self Care Goal Status (B8473) CI  Self Care Discharge Status (G8569) CI   Thank you! Norman Herrlich, MS OTR/L  Pager: [phone]

## 2017-06-09 ENCOUNTER — Ambulatory Visit (INDEPENDENT_AMBULATORY_CARE_PROVIDER_SITE_OTHER): Payer: Medicare HMO | Admitting: Specialist

## 2017-06-09 ENCOUNTER — Encounter (INDEPENDENT_AMBULATORY_CARE_PROVIDER_SITE_OTHER): Payer: Self-pay | Admitting: Specialist

## 2017-06-09 VITALS — BP 142/73 | HR 57 | Ht 70.0 in | Wt 194.0 lb

## 2017-06-09 DIAGNOSIS — M961 Postlaminectomy syndrome, not elsewhere classified: Secondary | ICD-10-CM

## 2017-06-09 DIAGNOSIS — M5412 Radiculopathy, cervical region: Secondary | ICD-10-CM

## 2017-06-09 NOTE — Patient Instructions (Signed)
Plan: Avoid overhead lifting and overhead use of the arms. Do not lift greater than 15 lbs. Adjust head rest in vehicle to prevent hyperextension if rear ended. Take extra precautions to avoid falling, including use of a cane if you feel weak. Use a theraband for strengthening of the right shoulder. See the shoulder manual for exercises.

## 2017-06-09 NOTE — Progress Notes (Signed)
Post-Op Visit Note   Patient: Jacob Farmer           Date of Birth: 1946-08-28           MRN: [id number] Visit Date: 06/09/2017 PCP: Redmond School, MD   Assessment & Plan: 2 weeks post Right C4-5 foramenotomy   Chief Complaint:  Chief Complaint  Patient presents with  . Neck - Routine Post Op    Had Right C4-5 Foraminotomy with excision of Herniated disc on 05/26/17  Clinical exam: Incision is healed, minimal erythrema. Motor with persistent right shoulder abduction weakness 4/5. Visit Diagnoses:  1. Cervical post-laminectomy syndrome   2. Cervical radiculopathy at C5     Plan: Avoid overhead lifting and overhead use of the arms. Do not lift greater than 15 lbs. Adjust head rest in vehicle to prevent hyperextension if rear ended. Take extra precautions to avoid falling, including use of a cane if you feel weak. Use a theraband for strengthening of the right shoulder. See the shoulder manual for exercises.    Follow-Up Instructions: No Follow-up on file.   Orders:  No orders of the defined types were placed in this encounter.  No orders of the defined types were placed in this encounter.   Imaging: No results found.  PMFS History: Patient Active Problem List   Diagnosis Date Noted  . Herniated cervical disc 05/26/2017    Priority: High    Class: Acute  . Herniation of cervical intervertebral disc with radiculopathy 05/26/2017  . Cervical spinal stenosis 05/26/2017  . Lumbar disc disease 02/04/2017  . Primary osteoarthritis of both hands 02/04/2017  . History of gout 02/04/2017  . DDD (degenerative disc disease), lumbar 02/04/2017  . Pain in joint of left knee 02/04/2017  . High risk medication use 01/28/2017  . History of prostate cancer 01/28/2017  . History of renal insufficiency syndrome 01/28/2017  . Malignant neoplasm of prostate ([place]) 11/17/2015  . Chronic kidney disease, stage 3, mod decreased GFR 12/27/2011  . Diabetes mellitus, type II ([place])  12/27/2011  . Rheumatoid arthritis ([place]) 01/04/2011  . SINUS BRADYCARDIA 01/12/2010  . 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 HYPERLIPIDEMIA 01/08/2010  . Hypertension 01/08/2010   Past Medical History:  Diagnosis Date  . Arteriosclerotic cardiovascular disease (ASCVD) cardiologist-  dr Roderic Palau branch   Acute myocardial infarction treated with TPA in 1990; 1999-stents to circumflex and RCA; residual total occlusion of the left anterior descending; normal ejection fraction; stress nuclear in 2001-distal anteroseptal ischemia; normal LV function  . Chronic kidney disease, stage 3, mod decreased GFR nephrologist-  at [facility] in [place]   Creatinine-2.18 and 12/2007, 1.76 and 12/2008  . First degree heart block   . History of gastric ulcer   . History of kidney stones    05/23/17- small = no pain- watching  . History of MI (myocardial infarction)    1990-  treated w/ TPA  . Hyperlipidemia   . Hypertension   . Myocardial infarction ([place]) 1990  . OA (osteoarthritis)   . Pre-diabetes   . Prediabetes   . Prostate cancer [facility]) urologist-- dr dahlstedt/  oncologist- dr Tammi Klippel   Stage T1c , Gleason 3+4,  PSA 4.7,  vol 24cc--  scheduled for radiative seed implants  . Psoriatic arthritis ([place])   . RA (rheumatoid arthritis) ([place])    rheumologist-  dr Toni Amend  . S/P drug eluting coronary stent placement    1999--  DES x2 to CFX and RCA  . Sinus bradycardia   .  Wears dentures    upper and lower partial    Family History  Problem Relation Age of Onset  . Heart attack Mother   . Coronary artery disease Father     Past Surgical History:  Procedure Laterality Date  . CARDIOVASCULAR STRESS TEST  2001  per Dr Lattie Haw clinic note   distal anteroseptal ischemia,  normal LVF  . COLONOSCOPY  last one 2006 (approx)  . CORONARY ANGIOPLASTY WITH STENT PLACEMENT  1999   DES x2  to CFX and RCA/  residual total occlusion LAD,  normal LVEF  . EYE SURGERY Bilateral     cataract  . POSTERIOR CERVICAL FUSION/FORAMINOTOMY N/A 05/26/2017   Procedure: RIGHT C4-5 FORAMINOTOMY WITH EXCISION OF HERNIATED [place];  Surgeon: Jessy Oto, MD;  Location: [place];  Service: Orthopedics;  Laterality: N/A;  . RADIOACTIVE SEED IMPLANT N/A 01/11/2016   Procedure: RADIOACTIVE SEED IMPLANT/BRACHYTHERAPY IMPLANT;  Surgeon: Franchot Gallo, MD;  Location: [facility];  Service: Urology;  Laterality: N/A;   71  seeds implanted no seeds founds in bladder   Social History   Occupational History  . Retired Clinical biochemist    Social History Main Topics  . Smoking status: Former Smoker    Packs/day: 1.00    Years: 30.00    Types: Cigarettes    Quit date: 05/21/1989  . Smokeless tobacco: Never Used  . Alcohol use 21.0 oz/week    14 Shots of liquor, 21 Standard drinks or equivalent per week     Comment: 2-3 oz per day  . Drug use: No  . Sexual activity: Not on file

## 2017-06-19 DIAGNOSIS — Z79899 Other long term (current) drug therapy: Secondary | ICD-10-CM | POA: Diagnosis not present

## 2017-06-19 DIAGNOSIS — E559 Vitamin D deficiency, unspecified: Secondary | ICD-10-CM | POA: Diagnosis not present

## 2017-06-19 DIAGNOSIS — D509 Iron deficiency anemia, unspecified: Secondary | ICD-10-CM | POA: Diagnosis not present

## 2017-06-19 DIAGNOSIS — I10 Essential (primary) hypertension: Secondary | ICD-10-CM | POA: Diagnosis not present

## 2017-06-19 DIAGNOSIS — R809 Proteinuria, unspecified: Secondary | ICD-10-CM | POA: Diagnosis not present

## 2017-06-19 DIAGNOSIS — N183 Chronic kidney disease, stage 3 (moderate): Secondary | ICD-10-CM | POA: Diagnosis not present

## 2017-06-24 DIAGNOSIS — C61 Malignant neoplasm of prostate: Secondary | ICD-10-CM | POA: Diagnosis not present

## 2017-06-24 NOTE — Progress Notes (Signed)
Office Visit Note  Patient: Jacob Farmer             Date of Birth: 09/09/1946           MRN: [id number]             PCP: Redmond School, MD Referring: Redmond School, MD Visit Date: 06/25/2017 Occupation: '@GUAROCC'$ @    Subjective:  Medication Management   History of Present Illness: Jacob Farmer is a 71 y.o. male  Was last seen in our office on 02/05/2017 for rheumatoid arthritis, gout, low back pain, OA of the hands and high risk prescription.  On that visit, patient reported that he was doing very well with his [place]. He also reported that allopurinol 100 mg daily,, proper diet and hydration is controlling his gout very well. His low back pain was treated by Dr. Louanne Skye new and patient is seen excellently results.  Today, patient reports that approximately 2 months ago since his Jacob Farmer is doing very well, he decided to use allopurinol 100 mg, half a tablet every day. Despite using this decreased dose of allopurinol, he has not had any flares of his gout. In addition, his RA is well controlled as well. He is on Arava 10 mg and doing well with his joints. No swelling stiffness or pain.  Patient reports that he saw his kidney doctor today and he was told that everything is going well.  Patient also states that for his neck pain, he had surgery through Dr. Otho Ket office and everything went very well. He also had back pain which Dr. Louanne Skye treated with an epidural at L4. That is also doing well. Note that patient has a history of prostate cancer. His urologist is Dr. Diona Fanti and his oncologist is Dr. Tammi Klippel.   Activities of Daily Living:  Patient reports morning stiffness for 15 minutes.   Patient Denies nocturnal pain.  Difficulty dressing/grooming: Denies Difficulty climbing stairs: Denies Difficulty getting out of chair: Denies Difficulty using hands for taps, buttons, cutlery, and/or writing: Denies   No Rheumatology ROS completed.   PMFS History:  Patient Active  Problem List   Diagnosis Date Noted  . Herniated cervical disc 05/26/2017    Class: Acute  . Herniation of cervical intervertebral disc with radiculopathy 05/26/2017  . Cervical spinal stenosis 05/26/2017  . Lumbar disc disease 02/04/2017  . Primary osteoarthritis of both hands 02/04/2017  . History of gout 02/04/2017  . DDD (degenerative disc disease), lumbar 02/04/2017  . Pain in joint of left knee 02/04/2017  . High risk medication use 01/28/2017  . History of prostate cancer 01/28/2017  . History of renal insufficiency syndrome 01/28/2017  . Malignant neoplasm of prostate ([place]) 11/17/2015  . Chronic kidney disease, stage 3, mod decreased GFR 12/27/2011  . Diabetes mellitus, type II ([place]) 12/27/2011  . Rheumatoid arthritis ([place]) 01/04/2011  . SINUS BRADYCARDIA 01/12/2010  . 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 HYPERLIPIDEMIA 01/08/2010  . Hypertension 01/08/2010    Past Medical History:  Diagnosis Date  . Arteriosclerotic cardiovascular disease (ASCVD) cardiologist-  dr Roderic Palau branch   Acute myocardial infarction treated with TPA in 1990; 1999-stents to circumflex and RCA; residual total occlusion of the left anterior descending; normal ejection fraction; stress nuclear in 2001-distal anteroseptal ischemia; normal LV function  . Chronic kidney disease, stage 3, mod decreased GFR nephrologist-  at [facility] in [place]   Creatinine-2.18 and 12/2007, 1.76 and 12/2008  . First degree heart block   . History of  gastric ulcer   . History of kidney stones    05/23/17- small = no pain- watching  . History of MI (myocardial infarction)    1990-  treated w/ TPA  . Hyperlipidemia   . Hypertension   . Myocardial infarction ([place]) 1990  . OA (osteoarthritis)   . Pre-diabetes   . Prediabetes   . Prostate cancer [facility]) urologist-- dr dahlstedt/  oncologist- dr Tammi Klippel   Stage T1c , Gleason 3+4,  PSA 4.7,  vol 24cc--  scheduled for radiative seed implants  .  Psoriatic arthritis ([place])   . RA (rheumatoid arthritis) ([place])    rheumologist-  dr Toni Amend  . S/P drug eluting coronary stent placement    1999--  DES x2 to CFX and RCA  . Sinus bradycardia   . Wears dentures    upper and lower partial    Family History  Problem Relation Age of Onset  . Heart attack Mother   . Coronary artery disease Father    Past Surgical History:  Procedure Laterality Date  . CARDIOVASCULAR STRESS TEST  2001  per Dr Lattie Haw clinic note   distal anteroseptal ischemia,  normal LVF  . COLONOSCOPY  last one 2006 (approx)  . CORONARY ANGIOPLASTY WITH STENT PLACEMENT  1999   DES x2  to CFX and RCA/  residual total occlusion LAD,  normal LVEF  . EYE SURGERY Bilateral    cataract  . POSTERIOR CERVICAL FUSION/FORAMINOTOMY N/A 05/26/2017   Procedure: RIGHT C4-5 FORAMINOTOMY WITH EXCISION OF HERNIATED [place];  Surgeon: Jessy Oto, MD;  Location: [place];  Service: Orthopedics;  Laterality: N/A;  . RADIOACTIVE SEED IMPLANT N/A 01/11/2016   Procedure: RADIOACTIVE SEED IMPLANT/BRACHYTHERAPY IMPLANT;  Surgeon: Franchot Gallo, MD;  Location: [facility];  Service: Urology;  Laterality: N/A;   71  seeds implanted no seeds founds in bladder   Social History   Social History Narrative   No regular exercise     Objective: Vital Signs: BP 120/72   Pulse 78   Resp 16   Ht _0  (1.778 m)   Wt 196 lb (88.9 kg)   BMI 28.12 kg/m    Physical Exam   Musculoskeletal Exam:  Full range of motion of all joints Grip strength is equal and strong bilaterally Fibroma also tender points are all absent  CDAI Exam: No CDAI exam completed.  No synovitis on examination  Investigation: No additional findings. Patient gets labs [facility] at [facility] [place] Admission on 05/26/2017, Discharged on 05/27/2017  Component Date Value Ref Range Status  . Glucose-Capillary 05/26/2017 147* 65 - 99 mg/dL Final  . Comment 1 05/26/2017 Notify RN   Final  . WBC  05/26/2017 7.9  4.0 - 10.5 K/uL Final  . RBC 05/26/2017 4.29  4.22 - 5.81 MIL/uL Final  . Hemoglobin 05/26/2017 12.9* 13.0 - 17.0 g/dL Final  . HCT 05/26/2017 39.1  39.0 - 52.0 % Final  . MCV 05/26/2017 91.1  78.0 - 100.0 fL Final  . MCH 05/26/2017 30.1  26.0 - 34.0 pg Final  . MCHC 05/26/2017 33.0  30.0 - 36.0 g/dL Final  . RDW 05/26/2017 14.6  11.5 - 15.5 % Final  . Platelets 05/26/2017 142* 150 - 400 K/uL Final  . Sodium 05/26/2017 139  135 - 145 mmol/L Final  . Potassium 05/26/2017 4.0  3.5 - 5.1 mmol/L Final  . Chloride 05/26/2017 109  101 - 111 mmol/L Final  . CO2 05/26/2017 23  22 - 32 mmol/L Final  .  Glucose, Bld 05/26/2017 138* 65 - 99 mg/dL Final  . BUN 05/26/2017 15  6 - 20 mg/dL Final  . Creatinine, Ser 05/26/2017 1.19  0.61 - 1.24 mg/dL Final  . Calcium 05/26/2017 8.6* 8.9 - 10.3 mg/dL Final  . Total Protein 05/26/2017 6.0* 6.5 - 8.1 g/dL Final  . Albumin 05/26/2017 3.1* 3.5 - 5.0 g/dL Final  . AST 05/26/2017 18  15 - 41 U/L Final  . ALT 05/26/2017 20  17 - 63 U/L Final  . Alkaline Phosphatase 05/26/2017 83  38 - 126 U/L Final  . Total Bilirubin 05/26/2017 0.9  0.3 - 1.2 mg/dL Final  . GFR calc non Af Amer 05/26/2017 59* >60 mL/min Final  . GFR calc Af Amer 05/26/2017 >60  >60 mL/min Final   Comment: (NOTE) The eGFR has been calculated using the CKD EPI equation. This calculation has not been validated in all clinical situations. eGFR's persistently <60 mL/min signify possible Chronic Kidney Disease.   . Anion gap 05/26/2017 7  5 - 15 Final  . Prothrombin Time 05/26/2017 13.6  11.4 - 15.2 seconds Final  . INR 05/26/2017 1.04   Final  . aPTT 05/26/2017 25  24 - 36 seconds Final  . MRSA, PCR 05/26/2017 NEGATIVE  NEGATIVE Final  . Staphylococcus aureus 05/26/2017 NEGATIVE  NEGATIVE Final   Comment:        The Xpert SA Assay (FDA approved for NASAL specimens in patients over 91 years of age), is one component of a comprehensive surveillance program.  Test  performance has been validated by [facility] for patients greater than or equal to 67 year old. It is not intended to diagnose infection nor to guide or monitor treatment.   . Glucose-Capillary 05/26/2017 146* 65 - 99 mg/dL Final  . Comment 1 05/26/2017 Notify RN   Final  . Comment 2 05/26/2017 Document in Chart   Final  . Hgb A1c MFr Bld 05/26/2017 6.1* 4.8 - 5.6 % Final   Comment: (NOTE)         Pre-diabetes: 5.7 - 6.4         Diabetes: >6.4         Glycemic control for adults with diabetes: <7.0   . Mean Plasma Glucose 05/26/2017 128  mg/dL Final   Comment: (NOTE) Performed At: [facility] [place], [place] [id number] Lindon Romp MD [id number]   . Glucose-Capillary 05/26/2017 182* 65 - 99 mg/dL Final  . Comment 1 05/26/2017 Notify RN   Final  . Comment 2 05/26/2017 Document in Chart   Final  . Glucose-Capillary 05/26/2017 224* 65 - 99 mg/dL Final  . Comment 1 05/26/2017 Notify RN   Final  . Comment 2 05/26/2017 Document in Chart   Final  . Glucose-Capillary 05/26/2017 167* 65 - 99 mg/dL Final  . Comment 1 05/26/2017 Notify RN   Final  . Comment 2 05/26/2017 Document in Chart   Final  . Glucose-Capillary 05/27/2017 137* 65 - 99 mg/dL Final  . Comment 1 05/27/2017 Notify RN   Final  . Comment 2 05/27/2017 Document in Chart   Final  . Glucose-Capillary 05/27/2017 95  65 - 99 mg/dL Final  Orders Only on 04/15/2017  Component Date Value Ref Range Status  . WBC 04/15/2017 6.5  3.8 - 10.8 K/uL Final  . RBC 04/15/2017 4.83  4.20 - 5.80 MIL/uL Final  . Hemoglobin 04/15/2017 14.1  13.2 - 17.1 g/dL Final  . HCT 04/15/2017 42.5  38.5 -  50.0 % Final  . MCV 04/15/2017 88.0  80.0 - 100.0 fL Final  . MCH 04/15/2017 29.2  27.0 - 33.0 pg Final  . MCHC 04/15/2017 33.2  32.0 - 36.0 g/dL Final  . RDW 04/15/2017 14.6  11.0 - 15.0 % Final  . Platelets 04/15/2017 161  140 - 400 K/uL Final  . MPV 04/15/2017 11.2  7.5 - 12.5 fL Final  . Neutro Abs 04/15/2017  3900  1,500 - 7,800 cells/uL Final  . Lymphs Abs 04/15/2017 1430  850 - 3,900 cells/uL Final  . Monocytes Absolute 04/15/2017 910  200 - 950 cells/uL Final  . Eosinophils Absolute 04/15/2017 260  15 - 500 cells/uL Final  . Basophils Absolute 04/15/2017 0  0 - 200 cells/uL Final  . Neutrophils Relative % 04/15/2017 60  % Final  . Lymphocytes Relative 04/15/2017 22  % Final  . Monocytes Relative 04/15/2017 14  % Final  . Eosinophils Relative 04/15/2017 4  % Final  . Basophils Relative 04/15/2017 0  % Final  . Smear Review 04/15/2017 Criteria for review not met   Final  . Sodium 04/15/2017 141  135 - 146 mmol/L Final  . Potassium 04/15/2017 4.7  3.5 - 5.3 mmol/L Final  . Chloride 04/15/2017 105  98 - 110 mmol/L Final  . CO2 04/15/2017 28  20 - 31 mmol/L Final  . Glucose, Bld 04/15/2017 90  65 - 99 mg/dL Final  . BUN 04/15/2017 21  7 - 25 mg/dL Final  . Creat 04/15/2017 1.31* 0.70 - 1.18 mg/dL Final   Comment:   For patients > or = 73 years of age: The upper reference limit for Creatinine is approximately 13% higher for people identified as African-American.     . Total Bilirubin 04/15/2017 0.5  0.2 - 1.2 mg/dL Final  . Alkaline Phosphatase 04/15/2017 95  40 - 115 U/L Final  . AST 04/15/2017 20  10 - 35 U/L Final  . ALT 04/15/2017 35  9 - 46 U/L Final  . Total Protein 04/15/2017 6.5  6.1 - 8.1 g/dL Final  . Albumin 04/15/2017 3.6  3.6 - 5.1 g/dL Final  . Calcium 04/15/2017 8.9  8.6 - 10.3 mg/dL Final  . GFR, Est African American 04/15/2017 62  >=60 mL/min Final  . GFR, Est Non African American 04/15/2017 54* >=60 mL/min Final  . Uric Acid, Serum 04/15/2017 4.9  4.0 - 8.0 mg/dL Final  Orders Only on 01/27/2017  Component Date Value Ref Range Status  . Sodium 01/27/2017 143  135 - 146 mmol/L Final  . Potassium 01/27/2017 5.0  3.5 - 5.3 mmol/L Final  . Chloride 01/27/2017 107  98 - 110 mmol/L Final  . CO2 01/27/2017 30  20 - 31 mmol/L Final  . Glucose, Bld 01/27/2017 84  65 - 99  mg/dL Final  . BUN 01/27/2017 17  7 - 25 mg/dL Final  . Creat 01/27/2017 1.27* 0.70 - 1.18 mg/dL Final   Comment:   For patients > or = 73 years of age: The upper reference limit for Creatinine is approximately 13% higher for people identified as African-American.     . Total Bilirubin 01/27/2017 0.4  0.2 - 1.2 mg/dL Final  . Alkaline Phosphatase 01/27/2017 109  40 - 115 U/L Final  . AST 01/27/2017 19  10 - 35 U/L Final  . ALT 01/27/2017 24  9 - 46 U/L Final  . Total Protein 01/27/2017 6.6  6.1 - 8.1 g/dL Final  . Albumin 01/27/2017 3.8  3.6 - 5.1 g/dL Final  . Calcium 01/27/2017 9.4  8.6 - 10.3 mg/dL Final  . GFR, Est African American 01/27/2017 65  >=60 mL/min Final  . GFR, Est Non African American 01/27/2017 56* >=60 mL/min Final  . WBC 01/27/2017 8.0  3.8 - 10.8 K/uL Final  . RBC 01/27/2017 5.02  4.20 - 5.80 MIL/uL Final  . Hemoglobin 01/27/2017 14.5  13.2 - 17.1 g/dL Final  . HCT 01/27/2017 44.4  38.5 - 50.0 % Final  . MCV 01/27/2017 88.4  80.0 - 100.0 fL Final  . MCH 01/27/2017 28.9  27.0 - 33.0 pg Final  . MCHC 01/27/2017 32.7  32.0 - 36.0 g/dL Final  . RDW 01/27/2017 13.7  11.0 - 15.0 % Final  . Platelets 01/27/2017 188  140 - 400 K/uL Final  . MPV 01/27/2017 11.5  7.5 - 12.5 fL Final  . Neutro Abs 01/27/2017 4880  1,500 - 7,800 cells/uL Final  . Lymphs Abs 01/27/2017 1680  850 - 3,900 cells/uL Final  . Monocytes Absolute 01/27/2017 1120* 200 - 950 cells/uL Final  . Eosinophils Absolute 01/27/2017 320  15 - 500 cells/uL Final  . Basophils Absolute 01/27/2017 0  0 - 200 cells/uL Final  . Neutrophils Relative % 01/27/2017 61  % Final  . Lymphocytes Relative 01/27/2017 21  % Final  . Monocytes Relative 01/27/2017 14  % Final  . Eosinophils Relative 01/27/2017 4  % Final  . Basophils Relative 01/27/2017 0  % Final  . Smear Review 01/27/2017 Criteria for review not met   Final     Imaging: No results found.  Speciality Comments: No specialty comments  available.    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 Doing well. No joint pain, swelling, stiffness.  #2: High risk prescription. Taking Arava 10 mg daily with good results Allopurinol 100 mg, half tablet daily with adequate response for his gout; patient is using half tablet for the last 2 months and has not had any flare of gout. I will recheck his uric acid levels to be sure that he is in the good range. If he is not in desirable range with his uric acid, I will asked the patient to return back to using 100 mg of allopurinol.  #3: Gout. All control. Using 50 mg of allopurinol daily for the last 2 months and has not had a flare. See above (#2) for full details.  #4: Order for CBC with differential, CMP with GFR, uric acid to be printed so he can take it to [facility]. Since her computers went down today, we will mail the order to his home so he can get the labs done in August.  #5: Patient reports increased skin sensitivity to the right lower abdomen and left upper abdomen. He has asked multiple doctors what they think might be going on and they were unable to make a diagnosis. I too am at a loss for this hypersensitivity to those areas. I advised him to see a dermatologist but they declined. They reported that their PCP did a CT scan of the abdomen which was normal except for small kidney stone on the right side.  #6: Return to clinic in 5 months  #7: Patient had surgery to his neck and his back done by  Dr. Louanne Skye. Both the surgeries have gone well and patient reports that his pain is significantly improved.  #8: OA of bilateral hands. DIP PIP prominence bilaterally   Orders: No orders of the defined types were placed in this encounter.  No orders of the  defined types were placed in this encounter.   Face-to-face time spent with patient was 30 minutes. 50% of time was spent in counseling and coordination of care.  Follow-Up Instructions: No Follow-up on file.   Eliezer Lofts, PA-C   I examined and evaluated the patient with Eliezer Lofts PA. The plan of care was discussed as noted above.  Bo Merino, MD  Note - This record has been created using Editor, commissioning.  Chart creation errors have been sought, but may not always  have been located. Such creation errors do not reflect on  the standard of medical care.

## 2017-06-25 ENCOUNTER — Ambulatory Visit (INDEPENDENT_AMBULATORY_CARE_PROVIDER_SITE_OTHER): Payer: Medicare HMO | Admitting: Rheumatology

## 2017-06-25 ENCOUNTER — Encounter: Payer: Self-pay | Admitting: Rheumatology

## 2017-06-25 VITALS — BP 120/72 | HR 78 | Resp 16 | Ht 70.0 in | Wt 196.0 lb

## 2017-06-25 DIAGNOSIS — Z8739 Personal history of other diseases of the musculoskeletal system and connective tissue: Secondary | ICD-10-CM

## 2017-06-25 DIAGNOSIS — M19041 Primary osteoarthritis, right hand: Secondary | ICD-10-CM | POA: Diagnosis not present

## 2017-06-25 DIAGNOSIS — M19042 Primary osteoarthritis, left hand: Secondary | ICD-10-CM

## 2017-06-25 DIAGNOSIS — N183 Chronic kidney disease, stage 3 (moderate): Secondary | ICD-10-CM | POA: Diagnosis not present

## 2017-06-25 DIAGNOSIS — M5136 Other intervertebral disc degeneration, lumbar region: Secondary | ICD-10-CM | POA: Diagnosis not present

## 2017-06-25 DIAGNOSIS — I10 Essential (primary) hypertension: Secondary | ICD-10-CM | POA: Diagnosis not present

## 2017-06-25 DIAGNOSIS — Z79899 Other long term (current) drug therapy: Secondary | ICD-10-CM

## 2017-06-25 DIAGNOSIS — M0609 Rheumatoid arthritis without rheumatoid factor, multiple sites: Secondary | ICD-10-CM | POA: Diagnosis not present

## 2017-06-25 DIAGNOSIS — N250 Renal osteodystrophy: Secondary | ICD-10-CM | POA: Diagnosis not present

## 2017-06-25 DIAGNOSIS — M519 Unspecified thoracic, thoracolumbar and lumbosacral intervertebral disc disorder: Secondary | ICD-10-CM | POA: Diagnosis not present

## 2017-06-25 DIAGNOSIS — M5010 Cervical disc disorder with radiculopathy, unspecified cervical region: Secondary | ICD-10-CM

## 2017-06-25 DIAGNOSIS — E875 Hyperkalemia: Secondary | ICD-10-CM | POA: Diagnosis not present

## 2017-06-25 DIAGNOSIS — R809 Proteinuria, unspecified: Secondary | ICD-10-CM | POA: Diagnosis not present

## 2017-06-25 MED ORDER — ALLOPURINOL 100 MG PO TABS: 100.0000 mg | ORAL_TABLET | Freq: Every day | ORAL | 1 refills | Status: DC

## 2017-06-25 MED ORDER — LEFLUNOMIDE 10 MG PO TABS: 10.0000 mg | ORAL_TABLET | Freq: Every day | ORAL | 0 refills | Status: DC

## 2017-06-26 NOTE — Addendum Note (Signed)
Addended byEliezer Lofts on: 06/26/2017 01:59 PM   Modules accepted: Orders

## 2017-07-01 ENCOUNTER — Ambulatory Visit (INDEPENDENT_AMBULATORY_CARE_PROVIDER_SITE_OTHER): Payer: Medicare HMO | Admitting: Urology

## 2017-07-01 DIAGNOSIS — R300 Dysuria: Secondary | ICD-10-CM | POA: Diagnosis not present

## 2017-07-01 DIAGNOSIS — N200 Calculus of kidney: Secondary | ICD-10-CM

## 2017-07-01 DIAGNOSIS — C61 Malignant neoplasm of prostate: Secondary | ICD-10-CM

## 2017-07-01 DIAGNOSIS — N320 Bladder-neck obstruction: Secondary | ICD-10-CM

## 2017-07-07 ENCOUNTER — Ambulatory Visit: Payer: Medicare HMO | Admitting: Rheumatology

## 2017-07-15 ENCOUNTER — Telehealth: Payer: Self-pay

## 2017-07-15 ENCOUNTER — Telehealth: Payer: Self-pay | Admitting: Rheumatology

## 2017-07-15 NOTE — Telephone Encounter (Signed)
Opened in error

## 2017-07-15 NOTE — Telephone Encounter (Signed)
Patinets wife called back to let us know [employer] now says they have both rx's. No need to resend. Disregard message.

## 2017-07-15 NOTE — Telephone Encounter (Signed)
Patient wife called stating that [facility] stated that they had not received a Rx for [facility] or Allopurinol, but is shows under medications that Rx's were order on 06/25/17.  Please advise. Thank You.

## 2017-07-16 DIAGNOSIS — K219 Gastro-esophageal reflux disease without esophagitis: Secondary | ICD-10-CM | POA: Diagnosis not present

## 2017-07-16 DIAGNOSIS — Z1389 Encounter for screening for other disorder: Secondary | ICD-10-CM | POA: Diagnosis not present

## 2017-07-16 DIAGNOSIS — B351 Tinea unguium: Secondary | ICD-10-CM | POA: Diagnosis not present

## 2017-07-16 DIAGNOSIS — E663 Overweight: Secondary | ICD-10-CM | POA: Diagnosis not present

## 2017-07-16 DIAGNOSIS — Z6828 Body mass index (BMI) 28.0-28.9, adult: Secondary | ICD-10-CM | POA: Diagnosis not present

## 2017-07-16 DIAGNOSIS — E119 Type 2 diabetes mellitus without complications: Secondary | ICD-10-CM | POA: Diagnosis not present

## 2017-07-16 DIAGNOSIS — M1991 Primary osteoarthritis, unspecified site: Secondary | ICD-10-CM | POA: Diagnosis not present

## 2017-07-16 DIAGNOSIS — Z0000 Encounter for general adult medical examination without abnormal findings: Secondary | ICD-10-CM | POA: Diagnosis not present

## 2017-07-21 ENCOUNTER — Encounter (INDEPENDENT_AMBULATORY_CARE_PROVIDER_SITE_OTHER): Payer: Self-pay | Admitting: Specialist

## 2017-08-07 ENCOUNTER — Encounter (INDEPENDENT_AMBULATORY_CARE_PROVIDER_SITE_OTHER): Payer: Self-pay | Admitting: Specialist

## 2017-08-07 ENCOUNTER — Ambulatory Visit (INDEPENDENT_AMBULATORY_CARE_PROVIDER_SITE_OTHER): Payer: Medicare HMO | Admitting: Specialist

## 2017-08-07 VITALS — BP 127/65 | HR 51

## 2017-08-07 DIAGNOSIS — M48062 Spinal stenosis, lumbar region with neurogenic claudication: Secondary | ICD-10-CM

## 2017-08-07 DIAGNOSIS — M961 Postlaminectomy syndrome, not elsewhere classified: Secondary | ICD-10-CM

## 2017-08-07 MED ORDER — BACLOFEN 10 MG PO TABS: 10.0000 mg | ORAL_TABLET | Freq: Three times a day (TID) | ORAL | 1 refills | Status: DC | PRN

## 2017-08-07 NOTE — Patient Instructions (Signed)
Adjust head rest in vehicle to prevent hyperextension if rear ended. Methacarbamol, renewed for muscle spasm. Avoid bending, stooping and avoid lifting weights greater than 10 lbs. Avoid prolong standing and walking. Avoid frequent bending and stooping   May use ice or moist heat for pain. Weight loss is of benefit. Handicap license is approved. Dr. Romona Curls secretary/Assistant will call to arrange for epidural steroid injection

## 2017-08-07 NOTE — Progress Notes (Addendum)
Post-Op Visit Note   Patient: Jacob Farmer           Date of Birth: [birth year]           MRN: [id number] Visit Date: 08/07/2017 PCP: Redmond School, MD   Assessment & Plan: 2 1/2 months post right C4-5 foraminotomy for stenosis and right C5 radiculopathy  Chief Complaint:  Chief Complaint  Patient presents with  . Neck - Follow-up   Visit Diagnoses:  1. Cervical post-laminectomy syndrome   2. Spinal stenosis of lumbar region with neurogenic claudication   Pain in the right shoulder and right arm is much improved, he notices improvement in the strength of the right shoulder post right C5 nerve decompression. Experiencing increasing back and left buttock pain as before that responded to a left L4 transforamenal ESI in 01/2017.  Clinically his cervical foramenotomy incision is well healed motor in the right deltoid and shoulder abductors is improving to 4+/5, right biceps is 5-/5. Lumbar exam with SLR negative, stooping with standing and walking. Motor in both legs is neurologically normal.    Plan: Avoid overhead lifting and overhead use of the arms. Do not lift greater than 5 lbs. Adjust head rest in vehicle to prevent hyperextension if rear ended. Methacarbamol, renewed for muscle spasm. Avoid bending, stooping and avoid lifting weights greater than 10 lbs. Avoid prolong standing and walking. Avoid frequent bending and stooping   May use ice or moist heat for pain. Weight loss is of benefit. Handicap license is approved. Dr. Romona Curls secretary/Assistant will call to arrange for epidural steroid injection Left L4-5.  Follow-Up Instructions: No Follow-up on file.   Orders:  No orders of the defined types were placed in this encounter.  No orders of the defined types were placed in this encounter.   Imaging: No results found.  PMFS History: Patient Active Problem List   Diagnosis Date Noted  . Herniated cervical disc 05/26/2017    Priority: High    Class: Acute    . Herniation of cervical intervertebral disc with radiculopathy 05/26/2017  . Cervical spinal stenosis 05/26/2017  . Lumbar disc disease 02/04/2017  . Primary osteoarthritis of both hands 02/04/2017  . History of gout 02/04/2017  . DDD (degenerative disc disease), lumbar 02/04/2017  . Pain in joint of left knee 02/04/2017  . High risk medication use 01/28/2017  . History of prostate cancer 01/28/2017  . History of renal insufficiency syndrome 01/28/2017  . Malignant neoplasm of prostate ([place]) 11/17/2015  . Chronic kidney disease, stage 3, mod decreased GFR 12/27/2011  . Diabetes mellitus, type II ([place]) 12/27/2011  . Rheumatoid arthritis ([place]) 01/04/2011  . SINUS BRADYCARDIA 01/12/2010  . 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 HYPERLIPIDEMIA 01/08/2010  . Hypertension 01/08/2010   Past Medical History:  Diagnosis Date  . Arteriosclerotic cardiovascular disease (ASCVD) cardiologist-  dr Roderic Palau branch   Acute myocardial infarction treated with TPA in 1990; 1999-stents to circumflex and RCA; residual total occlusion of the left anterior descending; normal ejection fraction; stress nuclear in 2001-distal anteroseptal ischemia; normal LV function  . Chronic kidney disease, stage 3, mod decreased GFR nephrologist-  at [facility] in [place]   Creatinine-2.18 and 12/2007, 1.76 and 12/2008  . First degree heart block   . History of gastric ulcer   . History of kidney stones    05/23/17- small = no pain- watching  . History of MI (myocardial infarction)    1990-  treated w/ TPA  . Hyperlipidemia   .  Hypertension   . Myocardial infarction ([place]) 1990  . OA (osteoarthritis)   . Pre-diabetes   . Prediabetes   . Prostate cancer [facility]) urologist-- dr dahlstedt/  oncologist- dr Tammi Klippel   Stage T1c , Gleason 3+4,  PSA 4.7,  vol 24cc--  scheduled for radiative seed implants  . Psoriatic arthritis ([place])   . RA (rheumatoid arthritis) ([place])    rheumologist-  dr Toni Amend   . S/P drug eluting coronary stent placement    1999--  DES x2 to CFX and RCA  . Sinus bradycardia   . Wears dentures    upper and lower partial    Family History  Problem Relation Age of Onset  . Heart attack Mother   . Coronary artery disease Father     Past Surgical History:  Procedure Laterality Date  . CARDIOVASCULAR STRESS TEST  2001  per Dr Lattie Haw clinic note   distal anteroseptal ischemia,  normal LVF  . COLONOSCOPY  last one 2006 (approx)  . CORONARY ANGIOPLASTY WITH STENT PLACEMENT  1999   DES x2  to CFX and RCA/  residual total occlusion LAD,  normal LVEF  . EYE SURGERY Bilateral    cataract  . POSTERIOR CERVICAL FUSION/FORAMINOTOMY N/A 05/26/2017   Procedure: RIGHT C4-5 FORAMINOTOMY WITH EXCISION OF HERNIATED [place];  Surgeon: Jessy Oto, MD;  Location: [place];  Service: Orthopedics;  Laterality: N/A;  . RADIOACTIVE SEED IMPLANT N/A 01/11/2016   Procedure: RADIOACTIVE SEED IMPLANT/BRACHYTHERAPY IMPLANT;  Surgeon: Franchot Gallo, MD;  Location: [facility];  Service: Urology;  Laterality: N/A;   71  seeds implanted no seeds founds in bladder   Social History   Occupational History  . Retired Clinical biochemist    Social History Main Topics  . Smoking status: Former Smoker    Packs/day: 1.00    Years: 30.00    Types: Cigarettes    Quit date: 05/21/1989  . Smokeless tobacco: Never Used  . Alcohol use 21.0 oz/week    14 Shots of liquor, 21 Standard drinks or equivalent per week     Comment: 2-3 oz per day  . Drug use: No  . Sexual activity: Not on file

## 2017-08-20 ENCOUNTER — Ambulatory Visit (INDEPENDENT_AMBULATORY_CARE_PROVIDER_SITE_OTHER): Payer: Medicare HMO | Admitting: Physical Medicine and Rehabilitation

## 2017-08-20 ENCOUNTER — Encounter (INDEPENDENT_AMBULATORY_CARE_PROVIDER_SITE_OTHER): Payer: Self-pay | Admitting: Physical Medicine and Rehabilitation

## 2017-08-20 ENCOUNTER — Ambulatory Visit (INDEPENDENT_AMBULATORY_CARE_PROVIDER_SITE_OTHER): Payer: Medicare HMO

## 2017-08-20 VITALS — BP 142/75 | HR 62

## 2017-08-20 DIAGNOSIS — M48062 Spinal stenosis, lumbar region with neurogenic claudication: Secondary | ICD-10-CM

## 2017-08-20 DIAGNOSIS — M5416 Radiculopathy, lumbar region: Secondary | ICD-10-CM | POA: Diagnosis not present

## 2017-08-20 MED ORDER — LIDOCAINE HCL (PF) 1 % IJ SOLN
2.0000 mL | Freq: Once | INTRAMUSCULAR | Status: AC
  Administered 2017-08-20: 2 mL

## 2017-08-20 MED ORDER — BETAMETHASONE SOD PHOS & ACET 6 (3-3) MG/ML IJ SUSP
12.0000 mg | Freq: Once | INTRAMUSCULAR | Status: AC
  Administered 2017-08-20: 12 mg

## 2017-08-20 NOTE — Patient Instructions (Signed)

## 2017-08-20 NOTE — Progress Notes (Deleted)
Increased low back pain for the past several weeks. States when he walks he feels tightness in both buttocks. Wakes with pain. Pain seems to be worse at night with laying down.

## 2017-08-25 NOTE — Procedures (Signed)
[name] is a 71 year old gentleman with prior lumbar surgery and followed by Dr. Louanne Skye. He is having bilateral symptoms into both buttocks as he walks and more of a neurogenic claudication type symptom. We are going to repeat bilateral L4 transforaminal injection. L4 transforaminal injection seemed to help in the past quite significantly. He'll follow up with Dr. Louanne Skye.  Lumbosacral Transforaminal Epidural Steroid Injection - Sub-Pedicular Approach with Fluoroscopic Guidance  Patient: Jacob Farmer      Date of Birth: 10-27-1946 MRN: [id number] PCP: Redmond School, MD      Visit Date: 08/20/2017   Universal Protocol:    Date/Time: 08/20/2017  Consent Given By: the patient  Position: PRONE  Additional Comments: Vital signs were monitored before and after the procedure. Patient was prepped and draped in the usual sterile fashion. The correct patient, procedure, and site was verified.   Injection Procedure Details:  Procedure Site One Meds Administered:  Meds ordered this encounter  Medications  . lidocaine (PF) (XYLOCAINE) 1 % injection 2 mL  . betamethasone acetate-betamethasone sodium phosphate (CELESTONE) injection 12 mg    Laterality: Bilateral  Location/Site:  L4-L5  Needle size: 22 G  Needle type: Spinal  Needle Placement: Transforaminal  Findings:  -Contrast Used: 1 mL iohexol 180 mg iodine/mL   -Comments: Excellent flow of contrast along the nerve and into the epidural space.  Procedure Details: After squaring off the end-plates to get a true AP view, the C-arm was positioned so that an oblique view of the foramen as noted above was visualized. The target area is just inferior to the "nose of the scotty dog" or sub pedicular. The soft tissues overlying this structure were infiltrated with 2-3 ml. of 1% Lidocaine without Epinephrine.  The spinal needle was inserted toward the target using a "trajectory" view along the fluoroscope beam.  Under AP and lateral  visualization, the needle was advanced so it did not puncture dura and was located close the 6 O'Clock position of the pedical in AP tracterory. Biplanar projections were used to confirm position. Aspiration was confirmed to be negative for CSF and/or blood. A 1-2 ml. volume of Isovue-250 was injected and flow of contrast was noted at each level. Radiographs were obtained for documentation purposes.   After attaining the desired flow of contrast documented above, a 0.5 to 1.0 ml test dose of 0.25% Marcaine was injected into each respective transforaminal space.  The patient was observed for 90 seconds post injection.  After no sensory deficits were reported, and normal lower extremity motor function was noted,   the above injectate was administered so that equal amounts of the injectate were placed at each foramen (level) into the transforaminal epidural space.   Additional Comments:  The patient tolerated the procedure well Dressing: Band-Aid    Post-procedure details: Patient was observed during the procedure. Post-procedure instructions were reviewed.  Patient left the clinic in stable condition.

## 2017-09-02 ENCOUNTER — Ambulatory Visit (INDEPENDENT_AMBULATORY_CARE_PROVIDER_SITE_OTHER): Payer: Medicare HMO | Admitting: Urology

## 2017-09-02 DIAGNOSIS — R351 Nocturia: Secondary | ICD-10-CM | POA: Diagnosis not present

## 2017-09-02 DIAGNOSIS — C61 Malignant neoplasm of prostate: Secondary | ICD-10-CM | POA: Diagnosis not present

## 2017-09-02 DIAGNOSIS — N401 Enlarged prostate with lower urinary tract symptoms: Secondary | ICD-10-CM | POA: Diagnosis not present

## 2017-09-02 DIAGNOSIS — R300 Dysuria: Secondary | ICD-10-CM

## 2017-09-04 ENCOUNTER — Other Ambulatory Visit: Payer: Self-pay | Admitting: Urology

## 2017-09-04 DIAGNOSIS — N200 Calculus of kidney: Secondary | ICD-10-CM

## 2017-09-08 IMAGING — MR MR LUMBAR SPINE W/O CM
4 of 5 series · 15 of 48 positions shown · non-contrast
Comparison: Lumbar spine radiographs August 01, 2016

CLINICAL DATA: Low back pain radiating to LEFT lower extremity for
4 months. No known injury. History of prostate cancer. Assess
stenosis/herniated nucleus pulposis.

EXAM:
MRI LUMBAR SPINE WITHOUT CONTRAST
TECHNIQUE: Multiplanar, multisequence MR imaging of the lumbar spine was
performed. No intravenous contrast was administered.

[Series 3: T2 · sagittal · 4.0mm · 0.75mm/px · 6 of 15 slices shown (1 of 2)]
[im 1/15]
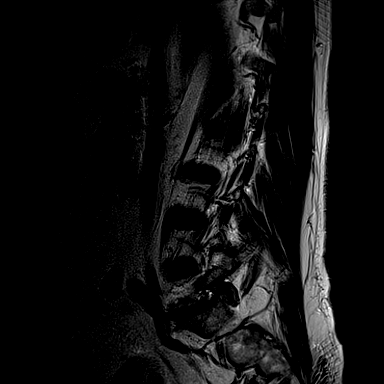
[im 3/15]
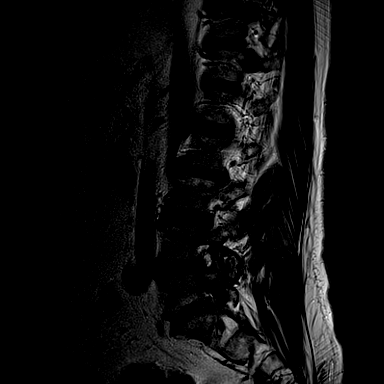
[im 6/15]
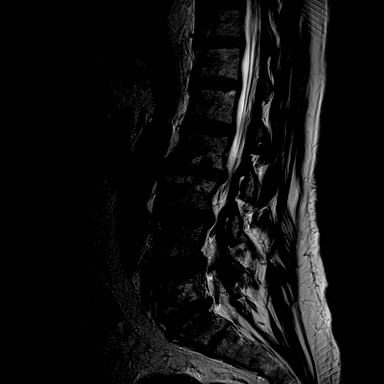
[im 9/15]
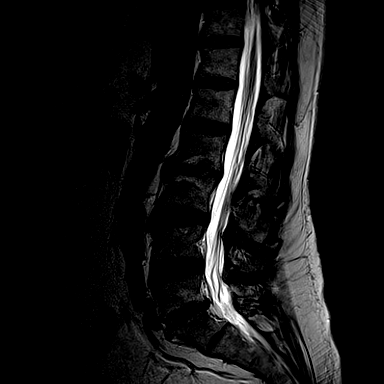
[im 12/15]
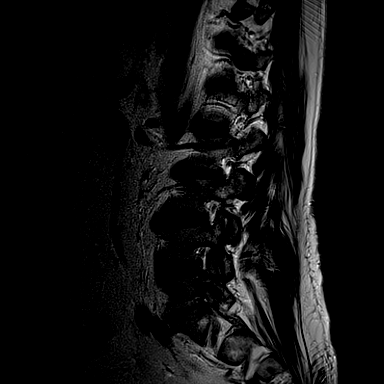
[im 15/15]
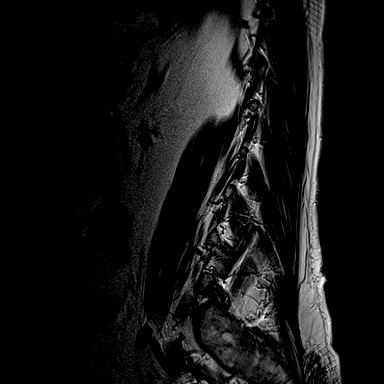

[Series 4: T1 · sagittal · 4.0mm · 0.41mm/px · 3 of 15 slices shown (1 of 2)]
[im 3/15]
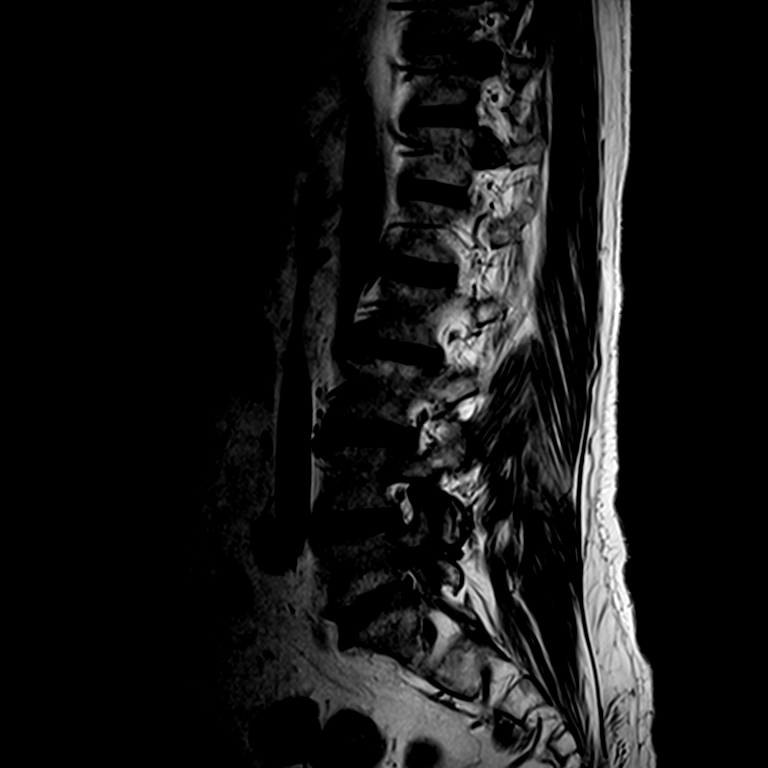
[im 9/15]
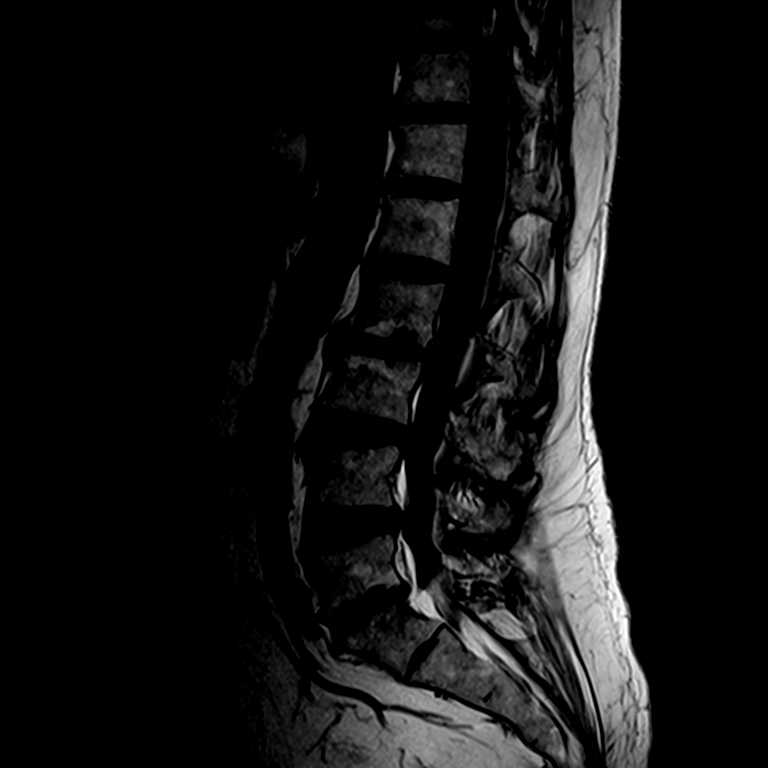
[im 15/15]
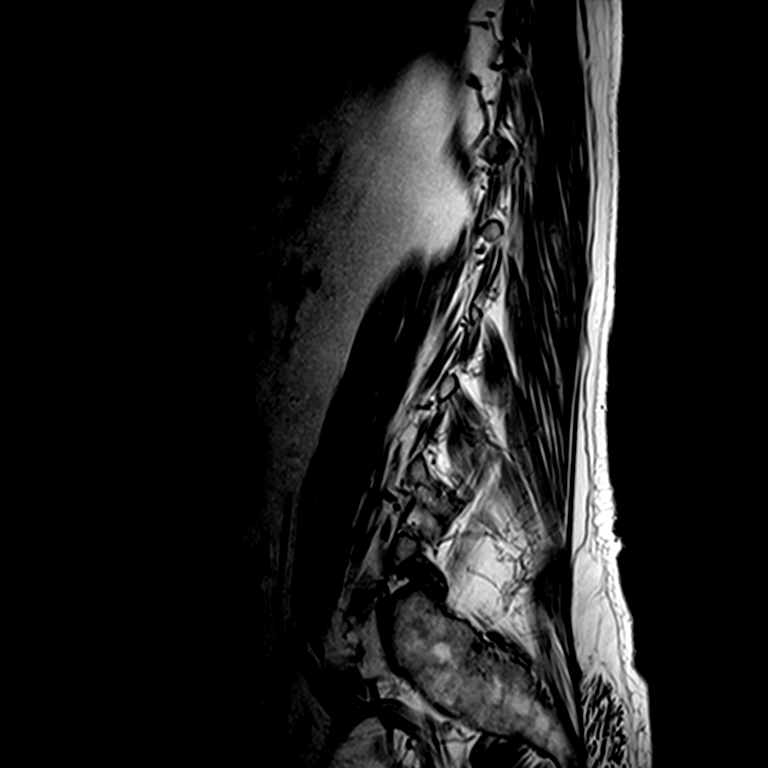

[Series 6: T2 · axial · 4.0mm · 0.21mm/px · z∈[-119,+13]mm · 3 of 38 slices shown (2 of 2)]
[im 6/38]
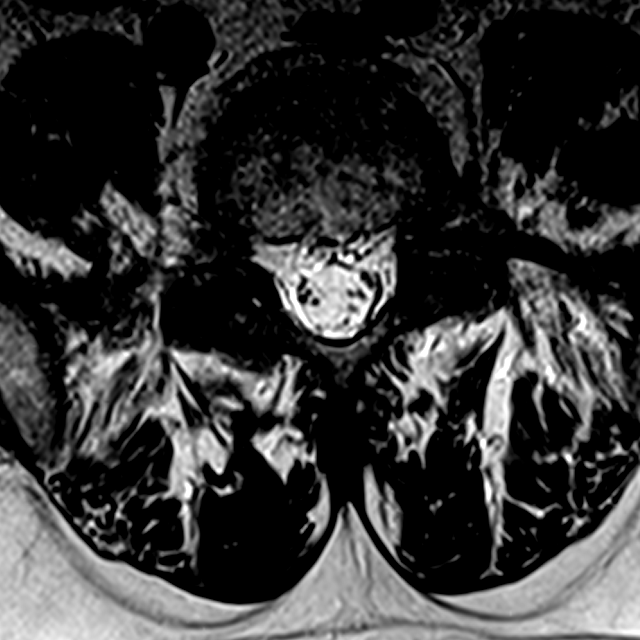
[im 19/38]
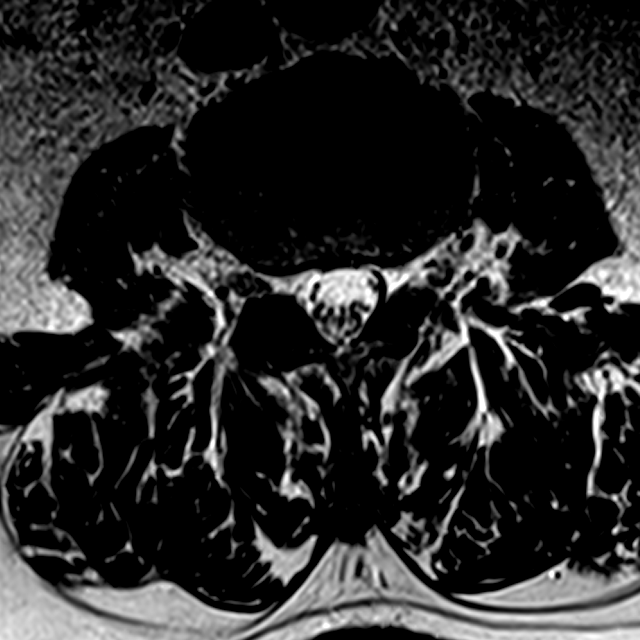
[im 32/38]
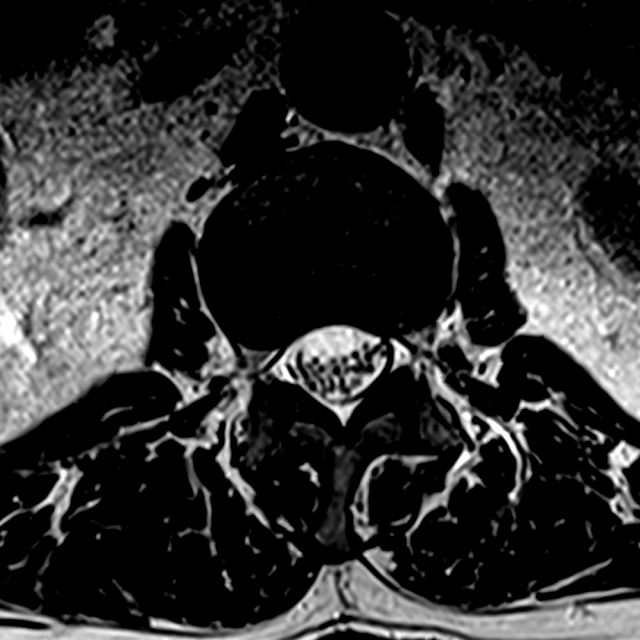

[Series 7: T1 · axial · 4.0mm · 0.21mm/px · z∈[-119,+13]mm · 3 of 38 slices shown (2 of 2)]
[im 6/38]
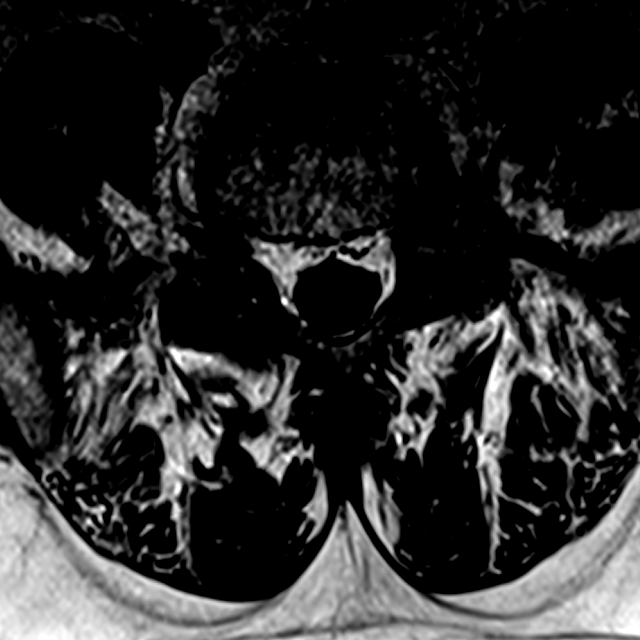
[im 19/38]
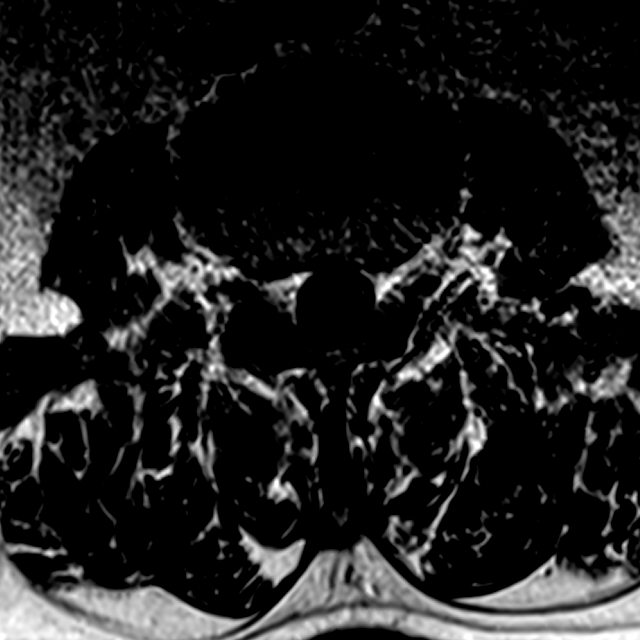
[im 32/38]
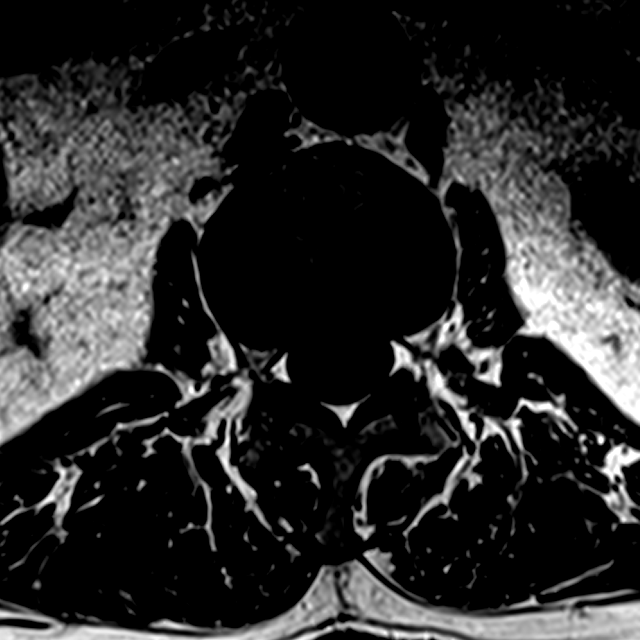

[15 of 48 positions shown; findings below may reference images not displayed]

FINDINGS: SEGMENTATION: For the purposes of this report, the last well-formed
intervertebral disc will be described as L5-S1.

ALIGNMENT: Maintenance of the lumbar lordosis. No malalignment.

VERTEBRAE:Vertebral bodies are intact. Moderate L2-3 and L5-S1 disc
height loss with decreased T2 signal within all disc compatible with
mild desiccation, superimposed focal edema L2-3 disc associated with
acute Schmorl's nodes. Moderate subacute on chronic discogenic
endplate changes L2-3, mild at L5-S1. No suspicious bone marrow
signal. Scattered old Schmorl's nodes.

CONUS MEDULLARIS: Conus medullaris terminates at T12-L1 and
demonstrates normal morphology and signal characteristics. Cauda
equina is normal.

PARASPINAL AND SOFT TISSUES: Included prevertebral and paraspinal
soft tissues are nonacute. Ectatic infrarenal aorta incompletely
characterized.

DISC LEVELS:

L1-2: No disc bulge, canal stenosis nor neural foraminal narrowing.

L2-3: Small broad-based disc bulge, mild facet arthropathy and
ligamentum flavum redundancy without canal stenosis. No neural
foraminal narrowing.

L3-4: Annular bulging, small RIGHT subarticular to extra foraminal
disc protrusion. Mild facet arthropathy and ligamentum flavum
redundancy without canal stenosis. Mild RIGHT neural foraminal
narrowing.

L4-5: Small broad-based disc bulge, LEFT subarticular annular
fissure. Moderate RIGHT mild LEFT facet arthropathy and ligamentum
flavum redundancy without canal stenosis. Minimal RIGHT, mild LEFT
neural foraminal narrowing.

L5-S1: Moderate broad-based disc bulge, LEFT central annular
fissure. Mild facet arthropathy. No canal stenosis. Moderate to
severe RIGHT, severe LEFT neural foraminal narrowing.
IMPRESSION: Degenerative change of the lumbar spine.  No canal stenosis.

Neural foraminal narrowing L3-4 through L5-S1: Severe on the LEFT at
L5-S1.

Multiple annular fissures.

## 2017-09-24 DIAGNOSIS — C61 Malignant neoplasm of prostate: Secondary | ICD-10-CM | POA: Diagnosis not present

## 2017-09-25 ENCOUNTER — Ambulatory Visit (INDEPENDENT_AMBULATORY_CARE_PROVIDER_SITE_OTHER): Payer: Medicare HMO | Admitting: Specialist

## 2017-09-25 ENCOUNTER — Encounter (INDEPENDENT_AMBULATORY_CARE_PROVIDER_SITE_OTHER): Payer: Self-pay | Admitting: Specialist

## 2017-09-25 VITALS — BP 124/59 | HR 61 | Ht 70.0 in | Wt 194.0 lb

## 2017-09-25 DIAGNOSIS — M5412 Radiculopathy, cervical region: Secondary | ICD-10-CM

## 2017-09-25 DIAGNOSIS — M48062 Spinal stenosis, lumbar region with neurogenic claudication: Secondary | ICD-10-CM | POA: Diagnosis not present

## 2017-09-25 NOTE — Progress Notes (Signed)
Office Visit Note   Patient: Jacob Farmer           Date of Birth: 09-12-1946           MRN: [id number] Visit Date: 09/25/2017              Requested by: Redmond School, [place], [place] [id number] PCP: Redmond School, MD   Assessment & Plan: Visit Diagnoses:  1. Cervical radiculopathy at C5   2. Spinal stenosis of lumbar region with neurogenic claudication     Plan: Avoid overhead lifting and overhead use of the arms. Do not lift greater than 5-10 lbs. Avoid bending, stooping and avoid lifting weights greater than 10 lbs. Avoid prolong standing and walking. Avoid frequent bending and stooping  No lifting greater than 10 lbs. May use ice or moist heat for pain. Weight loss is of benefit. Handicap license is approved. Call us if the lumbar pain and buttock tightness recur and we would reorder ESIs up to every 6 months, Dr. Romona Curls secretary/Assistant would then call to arrange for epidural steroid injection. Adjust head rest in vehicle to prevent hyperextension if rear ended. Take extra precautions to avoid falling, including use of a cane if you feel weak.  Follow-Up Instructions: Return in about 3 months (around 12/26/2017).   Orders:  No orders of the defined types were placed in this encounter.  No orders of the defined types were placed in this encounter.     Procedures: No procedures performed   Clinical Data: No additional findings.   Subjective: Chief Complaint  Patient presents with  . Neck - Follow-up    4 months post op    71 year old male post 2 sets of ESIs bilateral L4-5 TF for lumbar spinal stenosis.His pain is improved, prior to the injections he had significant buttock claudication prior To the ESIs by Dr. Ernestina Patches. No  Bowel or bladder difficulties. No leg numbness or weakness. Still symptoms with prolong standing and walking.     Review of Systems  Constitutional: Negative.   HENT: Negative.   Eyes: Negative.     Respiratory: Negative.   Cardiovascular: Negative.   Gastrointestinal: Negative.   Endocrine: Negative.   Genitourinary: Negative.   Musculoskeletal: Negative.   Skin: Negative.   Allergic/Immunologic: Negative.   Neurological: Negative.   Hematological: Negative.   Psychiatric/Behavioral: Negative.      Objective: Vital Signs: BP (!) 124/59 (BP Location: Left Arm, Patient Position: Sitting)   Pulse 61   Ht 5\' 10"  (1.778 m)   Wt 194 lb (88 kg)   BMI 27.84 kg/m   Physical Exam  Constitutional: He is oriented to person, place, and time. He appears well-developed and well-nourished.  HENT:  Head: Normocephalic and atraumatic.  Eyes: Pupils are equal, round, and reactive to light. EOM are normal.  Neck: Normal range of motion. Neck supple.  Pulmonary/Chest: Effort normal and breath sounds normal.  Abdominal: Soft. Bowel sounds are normal.  Neurological: He is alert and oriented to person, place, and time.  Skin: Skin is warm and dry.  Psychiatric: He has a normal mood and affect. His behavior is normal. Judgment and thought content normal.    Back Exam   Tenderness  The patient is experiencing tenderness in the cervical and lumbar.  Range of Motion  Extension: abnormal  Flexion: normal  Lateral Bend Right: abnormal  Lateral Bend Left: abnormal  Rotation Right: abnormal  Rotation Left: abnormal   Muscle Strength  Right Quadriceps:  5/5  Left Quadriceps:  5/5  Right Hamstrings:  5/5  Left Hamstrings:  5/5   Reflexes  Patellar: normal Achilles: normal Biceps: normal Babinski's sign: normal   Other  Toe Walk: normal Heel Walk: normal Sensation: normal Gait: normal  Erythema: no back redness Scars: absent  Comments:  Cervical ROM is improved, weakness right shoulder abduction 2/5      Specialty Comments:  No specialty comments available.  Imaging: No results found.   PMFS History: Patient Active Problem List   Diagnosis Date Noted  .  Herniated cervical disc 05/26/2017    Priority: High    Class: Acute  . Herniation of cervical intervertebral disc with radiculopathy 05/26/2017  . Cervical spinal stenosis 05/26/2017  . Lumbar disc disease 02/04/2017  . Primary osteoarthritis of both hands 02/04/2017  . History of gout 02/04/2017  . DDD (degenerative disc disease), lumbar 02/04/2017  . Pain in joint of left knee 02/04/2017  . High risk medication use 01/28/2017  . History of prostate cancer 01/28/2017  . History of renal insufficiency syndrome 01/28/2017  . Malignant neoplasm of prostate ([place]) 11/17/2015  . Chronic kidney disease, stage 3, mod decreased GFR ([facility]) 12/27/2011  . Diabetes mellitus, type II ([place]) 12/27/2011  . Rheumatoid arthritis ([place]) 01/04/2011  . SINUS BRADYCARDIA 01/12/2010  . 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 HYPERLIPIDEMIA 01/08/2010  . Hypertension 01/08/2010   Past Medical History:  Diagnosis Date  . Arteriosclerotic cardiovascular disease (ASCVD) cardiologist-  dr Roderic Palau branch   Acute myocardial infarction treated with TPA in 1990; 1999-stents to circumflex and RCA; residual total occlusion of the left anterior descending; normal ejection fraction; stress nuclear in 2001-distal anteroseptal ischemia; normal LV function  . Chronic kidney disease, stage 3, mod decreased GFR ([place]) nephrologist-  at [facility] in [place]   Creatinine-2.18 and 12/2007, 1.76 and 12/2008  . First degree heart block   . History of gastric ulcer   . History of kidney stones    05/23/17- small = no pain- watching  . History of MI (myocardial infarction)    1990-  treated w/ TPA  . Hyperlipidemia   . Hypertension   . Myocardial infarction ([place]) 1990  . OA (osteoarthritis)   . Pre-diabetes   . Prediabetes   . Prostate cancer [facility]) urologist-- dr dahlstedt/  oncologist- dr Tammi Klippel   Stage T1c , Gleason 3+4,  PSA 4.7,  vol 24cc--  scheduled for radiative seed implants  . Psoriatic  arthritis ([place])   . RA (rheumatoid arthritis) ([place])    rheumologist-  dr Toni Amend  . S/P drug eluting coronary stent placement    1999--  DES x2 to CFX and RCA  . Sinus bradycardia   . Wears dentures    upper and lower partial    Family History  Problem Relation Age of Onset  . Heart attack Mother   . Coronary artery disease Father     Past Surgical History:  Procedure Laterality Date  . CARDIOVASCULAR STRESS TEST  2001  per Dr Lattie Haw clinic note   distal anteroseptal ischemia,  normal LVF  . COLONOSCOPY  last one 2006 (approx)  . CORONARY ANGIOPLASTY WITH STENT PLACEMENT  1999   DES x2  to CFX and RCA/  residual total occlusion LAD,  normal LVEF  . EYE SURGERY Bilateral    cataract  . POSTERIOR CERVICAL FUSION/FORAMINOTOMY N/A 05/26/2017   Procedure: RIGHT C4-5 FORAMINOTOMY WITH EXCISION OF HERNIATED [place];  Surgeon: Jessy Oto, MD;  Location: [place];  Service: Orthopedics;  Laterality: N/A;  . RADIOACTIVE SEED IMPLANT N/A 01/11/2016   Procedure: RADIOACTIVE SEED IMPLANT/BRACHYTHERAPY IMPLANT;  Surgeon: Franchot Gallo, MD;  Location: [facility];  Service: Urology;  Laterality: N/A;   71  seeds implanted no seeds founds in bladder   Social History   Occupational History  . Retired Clinical biochemist    Social History Main Topics  . Smoking status: Former Smoker    Packs/day: 1.00    Years: 30.00    Types: Cigarettes    Quit date: 05/21/1989  . Smokeless tobacco: Never Used  . Alcohol use 21.0 oz/week    14 Shots of liquor, 21 Standard drinks or equivalent per week     Comment: 2-3 oz per day  . Drug use: No  . Sexual activity: Not on file

## 2017-09-25 NOTE — Patient Instructions (Signed)
Avoid overhead lifting and overhead use of the arms. Do not lift greater than 5-10 lbs. Avoid bending, stooping and avoid lifting weights greater than 10 lbs. Avoid prolong standing and walking. Avoid frequent bending and stooping  No lifting greater than 10 lbs. May use ice or moist heat for pain. Weight loss is of benefit. Handicap license is approved. Call us if the lumbar pain and buttock tightness recur and we would reorder ESIs up to every 6 months, Dr. Romona Curls secretary/Assistant would then call to arrange for epidural steroid injection. Adjust head rest in vehicle to prevent hyperextension if rear ended. Take extra precautions to avoid falling, including use of a cane if you feel weak.

## 2017-09-26 ENCOUNTER — Ambulatory Visit (HOSPITAL_COMMUNITY)
Admission: RE | Admit: 2017-09-26 | Discharge: 2017-09-26 | Disposition: A | Discharge status: Home / Self Care | Payer: Medicare HMO | Source: Ambulatory Visit | Attending: Urology | Admitting: Urology

## 2017-09-26 DIAGNOSIS — N200 Calculus of kidney: Secondary | ICD-10-CM | POA: Insufficient documentation

## 2017-09-26 DIAGNOSIS — N183 Chronic kidney disease, stage 3 (moderate): Secondary | ICD-10-CM | POA: Diagnosis not present

## 2017-09-30 ENCOUNTER — Ambulatory Visit (INDEPENDENT_AMBULATORY_CARE_PROVIDER_SITE_OTHER): Payer: Medicare HMO | Admitting: Urology

## 2017-09-30 DIAGNOSIS — C61 Malignant neoplasm of prostate: Secondary | ICD-10-CM

## 2017-09-30 DIAGNOSIS — R351 Nocturia: Secondary | ICD-10-CM | POA: Diagnosis not present

## 2017-09-30 DIAGNOSIS — N401 Enlarged prostate with lower urinary tract symptoms: Secondary | ICD-10-CM | POA: Diagnosis not present

## 2017-09-30 DIAGNOSIS — R300 Dysuria: Secondary | ICD-10-CM | POA: Diagnosis not present

## 2017-10-14 ENCOUNTER — Other Ambulatory Visit (INDEPENDENT_AMBULATORY_CARE_PROVIDER_SITE_OTHER): Payer: Self-pay | Admitting: Specialist

## 2017-10-14 ENCOUNTER — Telehealth (INDEPENDENT_AMBULATORY_CARE_PROVIDER_SITE_OTHER): Payer: Self-pay | Admitting: Specialist

## 2017-10-14 MED ORDER — GABAPENTIN 300 MG PO CAPS: ORAL_CAPSULE | ORAL | 0 refills | Status: DC

## 2017-10-14 NOTE — Telephone Encounter (Signed)
Patient needs 90 day supply of Gabapentin, [facility] in [place], [place]. Needs this Rx instead of Lyrica due to expense.

## 2017-10-14 NOTE — Telephone Encounter (Signed)
Patient needs 90 day supply of Gabapentin, [facility] in [place], [place]. Needs this Rx instead of Lyrica due to expense.  Please call patient back once Rx is sent in [phone], if patient doesn't answer please keep trying, no answering machine available.

## 2017-10-16 ENCOUNTER — Other Ambulatory Visit (INDEPENDENT_AMBULATORY_CARE_PROVIDER_SITE_OTHER): Payer: Self-pay | Admitting: Specialist

## 2017-10-16 NOTE — Telephone Encounter (Signed)
Rx prescribed 10/14/2017

## 2017-10-30 ENCOUNTER — Other Ambulatory Visit: Payer: Self-pay | Admitting: Rheumatology

## 2017-10-31 ENCOUNTER — Other Ambulatory Visit: Payer: Self-pay | Admitting: *Deleted

## 2017-10-31 DIAGNOSIS — Z79899 Other long term (current) drug therapy: Secondary | ICD-10-CM

## 2017-10-31 DIAGNOSIS — M2550 Pain in unspecified joint: Secondary | ICD-10-CM

## 2017-10-31 LAB — COMPLETE METABOLIC PANEL WITH GFR
AG Ratio: 1.2 (calc) (ref 1.0–2.5)
ALT: 14 U/L (ref 9–46)
AST: 14 U/L (ref 10–35)
Albumin: 3.5 g/dL — ABNORMAL LOW (ref 3.6–5.1)
Alkaline phosphatase (APISO): 124 U/L — ABNORMAL HIGH (ref 40–115)
BUN/Creatinine Ratio: 11 (calc) (ref 6–22)
BUN: 16 mg/dL (ref 7–25)
CO2: 30 mmol/L (ref 20–32)
Calcium: 8.9 mg/dL (ref 8.6–10.3)
Chloride: 105 mmol/L (ref 98–110)
Creat: 1.43 mg/dL — ABNORMAL HIGH (ref 0.70–1.18)
GFR, Est African American: 56 mL/min/{1.73_m2} — ABNORMAL LOW (ref >=60)
GFR, Est Non African American: 48 mL/min/{1.73_m2} — ABNORMAL LOW (ref >=60)
Globulin: 2.9 g/dL (calc) (ref 1.9–3.7)
Glucose, Bld: 182 mg/dL — ABNORMAL HIGH (ref 65–99)
Potassium: 5.2 mmol/L (ref 3.5–5.3)
Sodium: 141 mmol/L (ref 135–146)
Total Bilirubin: 0.6 mg/dL (ref 0.2–1.2)
Total Protein: 6.4 g/dL (ref 6.1–8.1)

## 2017-10-31 LAB — CBC WITH DIFFERENTIAL/PLATELET
Basophils Absolute: 52 cells/uL (ref 0–200)
Basophils Relative: 0.7 %
Eosinophils Absolute: 511 cells/uL — ABNORMAL HIGH (ref 15–500)
Eosinophils Relative: 6.9 %
HCT: 39.3 % (ref 38.5–50.0)
Hemoglobin: 12.9 g/dL — ABNORMAL LOW (ref 13.2–17.1)
Lymphs Abs: 1399 cells/uL (ref 850–3900)
MCH: 27.6 pg (ref 27.0–33.0)
MCHC: 32.8 g/dL (ref 32.0–36.0)
MCV: 84 fL (ref 80.0–100.0)
MPV: 11.3 fL (ref 7.5–12.5)
Monocytes Relative: 11.1 %
Neutro Abs: 4618 cells/uL (ref 1500–7800)
Neutrophils Relative %: 62.4 %
Platelets: 224 10*3/uL (ref 140–400)
RBC: 4.68 10*6/uL (ref 4.20–5.80)
RDW: 13 % (ref 11.0–15.0)
Total Lymphocyte: 18.9 %
WBC mixed population: 821 cells/uL (ref 200–950)
WBC: 7.4 10*3/uL (ref 3.8–10.8)

## 2017-10-31 LAB — URIC ACID: Uric Acid, Serum: 6.2 mg/dL (ref 4.0–8.0)

## 2017-10-31 NOTE — Telephone Encounter (Signed)
Last Visit: 06/25/17 Next Visit: 11/27/17 Labs: 05/26/17 stable  Patient advised due to update labs. Patient will update labs today.   Okay to refill 30 day supply per D.r Estanislado Pandy

## 2017-11-18 DIAGNOSIS — I10 Essential (primary) hypertension: Secondary | ICD-10-CM | POA: Diagnosis not present

## 2017-11-18 DIAGNOSIS — N183 Chronic kidney disease, stage 3 (moderate): Secondary | ICD-10-CM | POA: Diagnosis not present

## 2017-11-18 DIAGNOSIS — E559 Vitamin D deficiency, unspecified: Secondary | ICD-10-CM | POA: Diagnosis not present

## 2017-11-18 DIAGNOSIS — D509 Iron deficiency anemia, unspecified: Secondary | ICD-10-CM | POA: Diagnosis not present

## 2017-11-18 DIAGNOSIS — R809 Proteinuria, unspecified: Secondary | ICD-10-CM | POA: Diagnosis not present

## 2017-11-18 DIAGNOSIS — Z79899 Other long term (current) drug therapy: Secondary | ICD-10-CM | POA: Diagnosis not present

## 2017-11-26 ENCOUNTER — Ambulatory Visit: Payer: Medicare HMO | Admitting: Rheumatology

## 2017-11-26 DIAGNOSIS — R809 Proteinuria, unspecified: Secondary | ICD-10-CM | POA: Diagnosis not present

## 2017-11-26 DIAGNOSIS — I10 Essential (primary) hypertension: Secondary | ICD-10-CM | POA: Diagnosis not present

## 2017-11-26 DIAGNOSIS — N183 Chronic kidney disease, stage 3 (moderate): Secondary | ICD-10-CM | POA: Diagnosis not present

## 2017-11-26 DIAGNOSIS — E1129 Type 2 diabetes mellitus with other diabetic kidney complication: Secondary | ICD-10-CM | POA: Diagnosis not present

## 2017-11-27 ENCOUNTER — Ambulatory Visit: Payer: Medicare HMO | Admitting: Rheumatology

## 2017-11-27 ENCOUNTER — Encounter: Payer: Self-pay | Admitting: Rheumatology

## 2017-11-27 VITALS — BP 133/70 | HR 63 | Resp 19 | Ht 70.0 in | Wt 195.0 lb

## 2017-11-27 DIAGNOSIS — Z79899 Other long term (current) drug therapy: Secondary | ICD-10-CM | POA: Diagnosis not present

## 2017-11-27 DIAGNOSIS — M5136 Other intervertebral disc degeneration, lumbar region: Secondary | ICD-10-CM

## 2017-11-27 DIAGNOSIS — Z8679 Personal history of other diseases of the circulatory system: Secondary | ICD-10-CM

## 2017-11-27 DIAGNOSIS — Z8546 Personal history of malignant neoplasm of prostate: Secondary | ICD-10-CM | POA: Diagnosis not present

## 2017-11-27 DIAGNOSIS — M1A09X0 Idiopathic chronic gout, multiple sites, without tophus (tophi): Secondary | ICD-10-CM | POA: Diagnosis not present

## 2017-11-27 DIAGNOSIS — Z8719 Personal history of other diseases of the digestive system: Secondary | ICD-10-CM | POA: Diagnosis not present

## 2017-11-27 DIAGNOSIS — M19041 Primary osteoarthritis, right hand: Secondary | ICD-10-CM | POA: Diagnosis not present

## 2017-11-27 DIAGNOSIS — Z87448 Personal history of other diseases of urinary system: Secondary | ICD-10-CM | POA: Diagnosis not present

## 2017-11-27 DIAGNOSIS — M19042 Primary osteoarthritis, left hand: Secondary | ICD-10-CM

## 2017-11-27 DIAGNOSIS — Z8639 Personal history of other endocrine, nutritional and metabolic disease: Secondary | ICD-10-CM | POA: Diagnosis not present

## 2017-11-27 DIAGNOSIS — M0600 Rheumatoid arthritis without rheumatoid factor, unspecified site: Secondary | ICD-10-CM | POA: Diagnosis not present

## 2017-11-27 MED ORDER — LEFLUNOMIDE 10 MG PO TABS: 10.0000 mg | ORAL_TABLET | Freq: Every day | ORAL | 0 refills | Status: DC

## 2017-11-27 MED ORDER — ALLOPURINOL 100 MG PO TABS: 100.0000 mg | ORAL_TABLET | Freq: Every day | ORAL | 1 refills | Status: DC

## 2017-11-27 NOTE — Patient Instructions (Signed)
Standing Labs We placed an order today for your standing lab work.    Please come back and get your standing labs in March and every 3 months  We have open lab Monday through Friday from 8:30-11:30 AM and 1:30-4 PM at the office of Dr. Atiyah Bauer.   The office is located at [address], [address], [place], [place] [address] No appointment is necessary.   Labs are drawn by [facility].  You may receive a bill from [facility] for your lab work. If you have any questions regarding directions or hours of operation,  please call [phone].    

## 2017-11-27 NOTE — Progress Notes (Signed)
Office Visit Note  Patient: Jacob Farmer             Date of Birth: [birth year]           MRN: [id number]             PCP: Redmond School, MD Referring: Redmond School, MD Visit Date: 11/27/2017 Occupation: @GUAROCC @    Subjective:  Medication management   History of Present Illness: Jacob Farmer is a 71 y.o. male with history of rheumatoid arthritis and gout. He states he has not had any gout flare since the last visit. His rheumatoid arthritis seems to be quite well-controlled on leflunomide 10 mg by mouth daily. He reduced his allopurinol to 50 mg every day and has been tolerating it well. He does have some lower back pain. He had last epidural injections in September 2018.  Activities of Daily Living:  Patient reports morning stiffness for 0 minute.   Patient Denies nocturnal pain.  Difficulty dressing/grooming: Denies Difficulty climbing stairs: Reports due to left lower extremity weakness Difficulty getting out of chair: Denies Difficulty using hands for taps, buttons, cutlery, and/or writing: Denies   Review of Systems  Constitutional: Negative for fatigue, night sweats and weakness ( ).  HENT: Negative for mouth sores, mouth dryness and nose dryness.   Eyes: Negative for redness and dryness.  Respiratory: Negative for shortness of breath and difficulty breathing.   Cardiovascular: Negative for chest pain, palpitations, hypertension, irregular heartbeat and swelling in legs/feet.  Gastrointestinal: Negative for constipation and diarrhea.  Endocrine: Negative for increased urination.  Musculoskeletal: Positive for arthralgias and joint pain. Negative for joint swelling, myalgias, muscle weakness, morning stiffness, muscle tenderness and myalgias.  Skin: Negative for color change, rash, hair loss, nodules/bumps, skin tightness, ulcers and sensitivity to sunlight.  Allergic/Immunologic: Negative for susceptible to infections.  Neurological: Negative for dizziness,  fainting, memory loss and night sweats.  Hematological: Negative for swollen glands.  Psychiatric/Behavioral: Negative for depressed mood and sleep disturbance. The patient is not nervous/anxious.     PMFS History:  Patient Active Problem List   Diagnosis Date Noted  . Herniated cervical disc 05/26/2017    Class: Acute  . Herniation of cervical intervertebral disc with radiculopathy 05/26/2017  . Cervical spinal stenosis 05/26/2017  . Lumbar disc disease 02/04/2017  . Primary osteoarthritis of both hands 02/04/2017  . History of gout 02/04/2017  . DDD (degenerative disc disease), lumbar 02/04/2017  . Pain in joint of left knee 02/04/2017  . High risk medication use 01/28/2017  . History of prostate cancer 01/28/2017  . History of renal insufficiency syndrome 01/28/2017  . Malignant neoplasm of prostate ([place]) 11/17/2015  . Chronic kidney disease, stage 3, mod decreased GFR (HCC) 12/27/2011  . Diabetes mellitus, type II ([place]) 12/27/2011  . Rheumatoid arthritis ([place]) 01/04/2011  . SINUS BRADYCARDIA 01/12/2010  . 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 HYPERLIPIDEMIA 01/08/2010  . Hypertension 01/08/2010    Past Medical History:  Diagnosis Date  . Arteriosclerotic cardiovascular disease (ASCVD) cardiologist-  dr Roderic Palau branch   Acute myocardial infarction treated with TPA in 1990; 1999-stents to circumflex and RCA; residual total occlusion of the left anterior descending; normal ejection fraction; stress nuclear in 2001-distal anteroseptal ischemia; normal LV function  . Chronic kidney disease, stage 3, mod decreased GFR ([place]) nephrologist-  at [facility] in [place]   Creatinine-2.18 and 12/2007, 1.76 and 12/2008  . First degree heart block   . History of gastric ulcer   .  History of kidney stones    05/23/17- small = no pain- watching  . History of MI (myocardial infarction)    1990-  treated w/ TPA  . Hyperlipidemia   . Hypertension   . Myocardial  infarction ([place]) 1990  . OA (osteoarthritis)   . Pre-diabetes   . Prediabetes   . Prostate cancer [facility]) urologist-- dr dahlstedt/  oncologist- dr Tammi Klippel   Stage T1c , Gleason 3+4,  PSA 4.7,  vol 24cc--  scheduled for radiative seed implants  . Psoriatic arthritis ([place])   . RA (rheumatoid arthritis) ([place])    rheumologist-  dr Toni Amend  . S/P drug eluting coronary stent placement    1999--  DES x2 to CFX and RCA  . Sinus bradycardia   . Wears dentures    upper and lower partial    Family History  Problem Relation Age of Onset  . Heart attack Mother   . Coronary artery disease Father    Past Surgical History:  Procedure Laterality Date  . CARDIOVASCULAR STRESS TEST  2001  per Dr Lattie Haw clinic note   distal anteroseptal ischemia,  normal LVF  . COLONOSCOPY  last one 2006 (approx)  . CORONARY ANGIOPLASTY WITH STENT PLACEMENT  1999   DES x2  to CFX and RCA/  residual total occlusion LAD,  normal LVEF  . EYE SURGERY Bilateral    cataract  . POSTERIOR CERVICAL FUSION/FORAMINOTOMY N/A 05/26/2017   Procedure: RIGHT C4-5 FORAMINOTOMY WITH EXCISION OF HERNIATED [place];  Surgeon: Jessy Oto, MD;  Location: [facility];  Service: Orthopedics;  Laterality: N/A;  . RADIOACTIVE SEED IMPLANT N/A 01/11/2016   Procedure: RADIOACTIVE SEED IMPLANT/BRACHYTHERAPY IMPLANT;  Surgeon: Franchot Gallo, MD;  Location: [facility];  Service: Urology;  Laterality: N/A;   71  seeds implanted no seeds founds in bladder   Social History   Social History Narrative   No regular exercise     Objective: Vital Signs: BP 133/70 (BP Location: Left Arm, Patient Position: Sitting, Cuff Size: Normal)   Pulse 63   Resp 19   Ht 5\' 10"  (1.778 m)   Wt 195 lb (88.5 kg)   BMI 27.98 kg/m    Physical Exam  Constitutional: He is oriented to person, place, and time. He appears well-developed and well-nourished.  HENT:  Head: Normocephalic and atraumatic.  Eyes: Conjunctivae and EOM are normal. Pupils  are equal, round, and reactive to light.  Neck: Normal range of motion. Neck supple.  Cardiovascular: Normal rate, regular rhythm and normal heart sounds.  Pulmonary/Chest: Effort normal and breath sounds normal.  Abdominal: Soft. Bowel sounds are normal.  Neurological: He is alert and oriented to person, place, and time.  Skin: Skin is warm and dry. Capillary refill takes less than 2 seconds.  Psychiatric: He has a normal mood and affect. His behavior is normal.  Nursing note and vitals reviewed.    Musculoskeletal Exam: C-spine and thoracic good range of motion. He has limited range of motion of his lumbar spine with some discomfort. Shoulder joints elbow joints wrist joints are good range of motion. He has some synovial thickening over her MCP joints and PIP joints with no synovitis. Hip joints knee joints ankles MTPs PIPs DIPs are good range of motion with no synovitis.  CDAI Exam: CDAI Homunculus Exam:   Joint Counts:  CDAI Tender Joint count: 0 CDAI Swollen Joint count: 0  Global Assessments:  Patient Global Assessment: 1 Provider Global Assessment: 1  CDAI Calculated Score: 2  Investigation: No additional findings. Uric acid: 10/31/2017 6.2 CBC Latest Ref Rng & Units 10/31/2017 05/26/2017 04/15/2017  WBC 3.8 - 10.8 Thousand/uL 7.4 7.9 6.5  Hemoglobin 13.2 - 17.1 g/dL 12.9(L) 12.9(L) 14.1  Hematocrit 38.5 - 50.0 % 39.3 39.1 42.5  Platelets 140 - 400 Thousand/uL 224 142(L) 161   CMP Latest Ref Rng & Units 10/31/2017 05/26/2017 04/15/2017  Glucose 65 - 99 mg/dL 182(H) 138(H) 90  BUN 7 - 25 mg/dL 16 15 21   Creatinine 0.70 - 1.18 mg/dL 1.43(H) 1.19 1.31(H)  Sodium 135 - 146 mmol/L 141 139 141  Potassium 3.5 - 5.3 mmol/L 5.2 4.0 4.7  Chloride 98 - 110 mmol/L 105 109 105  CO2 20 - 32 mmol/L 30 23 28   Calcium 8.6 - 10.3 mg/dL 8.9 8.6(L) 8.9  Total Protein 6.1 - 8.1 g/dL 6.4 6.0(L) 6.5  Total Bilirubin 0.2 - 1.2 mg/dL 0.6 0.9 0.5  Alkaline Phos 38 - 126 U/L - 83 95  AST 10 -  35 U/L 14 18 20   ALT 9 - 46 U/L 14 20 35    Imaging: No results found.  Speciality Comments: No specialty comments available.    Procedures:  No procedures performed Allergies: No known allergies   Assessment / Plan:     Visit Diagnoses: Seronegative rheumatoid arthritis [facility]): Patient had no synovitis on examination. His been tolerating her medications well.  High risk medication use - Arava 10 mg by mouth daily. His labs have been stable. He states his labs done at his nephrologist yesterday showed GFR of 56. I do not have those results. We will request those results.  Primary osteoarthritis of both hands: Joint protection and muscle strengthening discussed.  Idiopathic chronic gout of multiple sites without tophus - allopurinol 50 mg by mouth dailyUric acid: 10/31/2017 6.2. He has not had any gout flare and he is tolerating allopurinol well.  DDD (degenerative disc disease), lumbar: Chronic pain he's been getting epidural injections by Dr. Ernestina Patches.  History of renal insufficiency syndrome: He is been followed by nephrologist.  Other medical problems are listed as follows:  History of gastroesophageal reflux (GERD)  History of coronary artery disease  History of hypertension  History of hypercholesterolemia  History of prostate cancer    Orders: No orders of the defined types were placed in this encounter.  Meds ordered this encounter  Medications  . allopurinol (ZYLOPRIM) 100 MG tablet    Sig: Take 1 tablet (100 mg total) by mouth daily.    Dispense:  90 tablet    Refill:  1  . leflunomide (ARAVA) 10 MG tablet    Sig: Take 1 tablet (10 mg total) by mouth daily.    Dispense:  90 tablet    Refill:  0      Follow-Up Instructions: Return in about 5 months (around 04/27/2018) for Rheumatoid arthritis, Osteoarthritis, Gout.   Bo Merino, MD  Note - This record has been created using Editor, commissioning.  Chart creation errors have been sought, but may not  always  have been located. Such creation errors do not reflect on  the standard of medical care.

## 2017-12-12 DIAGNOSIS — N400 Enlarged prostate without lower urinary tract symptoms: Secondary | ICD-10-CM | POA: Diagnosis not present

## 2017-12-12 DIAGNOSIS — B351 Tinea unguium: Secondary | ICD-10-CM | POA: Diagnosis not present

## 2017-12-12 DIAGNOSIS — K219 Gastro-esophageal reflux disease without esophagitis: Secondary | ICD-10-CM | POA: Diagnosis not present

## 2017-12-12 DIAGNOSIS — I10 Essential (primary) hypertension: Secondary | ICD-10-CM | POA: Diagnosis not present

## 2017-12-12 DIAGNOSIS — M069 Rheumatoid arthritis, unspecified: Secondary | ICD-10-CM | POA: Diagnosis not present

## 2017-12-12 DIAGNOSIS — E1140 Type 2 diabetes mellitus with diabetic neuropathy, unspecified: Secondary | ICD-10-CM | POA: Diagnosis not present

## 2017-12-12 DIAGNOSIS — Z1389 Encounter for screening for other disorder: Secondary | ICD-10-CM | POA: Diagnosis not present

## 2017-12-12 DIAGNOSIS — Z6828 Body mass index (BMI) 28.0-28.9, adult: Secondary | ICD-10-CM | POA: Diagnosis not present

## 2017-12-19 ENCOUNTER — Ambulatory Visit: Payer: Medicare HMO | Admitting: Rheumatology

## 2017-12-30 ENCOUNTER — Ambulatory Visit (INDEPENDENT_AMBULATORY_CARE_PROVIDER_SITE_OTHER): Payer: Medicare HMO | Admitting: Specialist

## 2017-12-30 ENCOUNTER — Encounter (INDEPENDENT_AMBULATORY_CARE_PROVIDER_SITE_OTHER): Payer: Self-pay | Admitting: Specialist

## 2017-12-30 VITALS — BP 122/63 | HR 55 | Ht 70.0 in | Wt 194.0 lb

## 2017-12-30 DIAGNOSIS — M5412 Radiculopathy, cervical region: Secondary | ICD-10-CM | POA: Diagnosis not present

## 2017-12-30 MED ORDER — GABAPENTIN 300 MG PO CAPS: ORAL_CAPSULE | ORAL | 3 refills | Status: DC

## 2017-12-30 NOTE — Progress Notes (Signed)
Office Visit Note   Patient: Jacob Farmer           Date of Birth: Apr 26, 1946           MRN: [id number] Visit Date: 12/30/2017              Requested by: Redmond School, [address] [place], [place] [address] PCP: Redmond School, MD   Assessment & Plan: Visit Diagnoses:  1. Right cervical radiculopathy     Plan: Avoid overhead lifting and overhead use of the arms. Do not lift greater than 5 lbs. Adjust head rest in vehicle to prevent hyperextension if rear ended. Take extra precautions to avoid falling. Continue with right biceps and right shoulder exercises.  Follow-Up Instructions: Return in about 4 weeks (around 01/27/2018).   Orders:  No orders of the defined types were placed in this encounter.  No orders of the defined types were placed in this encounter.     Procedures: No procedures performed   Clinical Data: No additional findings.   Subjective: Chief Complaint  Patient presents with  . Neck - Follow-up    72 year old male post right cervical foramenotomy.05/26/2018.    Review of Systems  Constitutional: Negative.   HENT: Negative.   Eyes: Negative.   Respiratory: Negative.   Cardiovascular: Negative.   Gastrointestinal: Negative.   Endocrine: Negative.   Genitourinary: Negative.   Musculoskeletal: Negative.   Skin: Negative.   Allergic/Immunologic: Negative.   Neurological: Negative.   Hematological: Negative.   Psychiatric/Behavioral: Negative.      Objective: Vital Signs: BP 122/63 (BP Location: Left Arm, Patient Position: Sitting)   Pulse (!) 55   Ht 5\' 10"  (1.778 m)   Wt 194 lb (88 kg)   BMI 27.84 kg/m   Physical Exam  Constitutional: He is oriented to person, place, and time. He appears well-developed and well-nourished.  HENT:  Head: Normocephalic and atraumatic.  Eyes: EOM are normal. Pupils are equal, round, and reactive to light.  Neck: Normal range of motion. Neck supple.  Pulmonary/Chest: Effort normal  and breath sounds normal.  Abdominal: Soft. Bowel sounds are normal.  Musculoskeletal: Normal range of motion.  Neurological: He is alert and oriented to person, place, and time.  Skin: Skin is warm and dry.  Psychiatric: He has a normal mood and affect. His behavior is normal. Judgment and thought content normal.    Back Exam   Tenderness  The patient is experiencing tenderness in the cervical.  Muscle Strength  Right Quadriceps:  5/5  Left Quadriceps:  5/5  Right Hamstrings:  5/5  Left Hamstrings:  5/5   Reflexes  Patellar: normal Achilles: normal Biceps:  Hyporeflexic abnormal  Comments:  Right biceps strength is better as is right shoulder abduction improving 4/5/      Specialty Comments:  No specialty comments available.  Imaging: No results found.   PMFS History: Patient Active Problem List   Diagnosis Date Noted  . Herniated cervical disc 05/26/2017    Priority: High    Class: Acute  . Herniation of cervical intervertebral disc with radiculopathy 05/26/2017  . Cervical spinal stenosis 05/26/2017  . Lumbar disc disease 02/04/2017  . Primary osteoarthritis of both hands 02/04/2017  . History of gout 02/04/2017  . DDD (degenerative disc disease), lumbar 02/04/2017  . Pain in joint of left knee 02/04/2017  . High risk medication use 01/28/2017  . History of prostate cancer 01/28/2017  . History of renal insufficiency syndrome 01/28/2017  .  Malignant neoplasm of prostate ([place]) 11/17/2015  . Chronic kidney disease, stage 3, mod decreased GFR (HCC) 12/27/2011  . Diabetes mellitus, type II ([place]) 12/27/2011  . Rheumatoid arthritis ([place]) 01/04/2011  . SINUS BRADYCARDIA 01/12/2010  . 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 HYPERLIPIDEMIA 01/08/2010  . Hypertension 01/08/2010   Past Medical History:  Diagnosis Date  . Arteriosclerotic cardiovascular disease (ASCVD) cardiologist-  dr Roderic Palau branch   Acute myocardial infarction treated  with TPA in 1990; 1999-stents to circumflex and RCA; residual total occlusion of the left anterior descending; normal ejection fraction; stress nuclear in 2001-distal anteroseptal ischemia; normal LV function  . Chronic kidney disease, stage 3, mod decreased GFR ([place]) nephrologist-  at [facility] in [place]   Creatinine-2.18 and 12/2007, 1.76 and 12/2008  . First degree heart block   . History of gastric ulcer   . History of kidney stones    05/23/17- small = no pain- watching  . History of MI (myocardial infarction)    1990-  treated w/ TPA  . Hyperlipidemia   . Hypertension   . Myocardial infarction ([place]) 1990  . OA (osteoarthritis)   . Pre-diabetes   . Prediabetes   . Prostate cancer [facility]) urologist-- dr dahlstedt/  oncologist- dr Tammi Klippel   Stage T1c , Gleason 3+4,  PSA 4.7,  vol 24cc--  scheduled for radiative seed implants  . Psoriatic arthritis ([place])   . RA (rheumatoid arthritis) ([place])    rheumologist-  dr Toni Amend  . S/P drug eluting coronary stent placement    1999--  DES x2 to CFX and RCA  . Sinus bradycardia   . Wears dentures    upper and lower partial    Family History  Problem Relation Age of Onset  . Heart attack Mother   . Coronary artery disease Father     Past Surgical History:  Procedure Laterality Date  . CARDIOVASCULAR STRESS TEST  2001  per Dr Lattie Haw clinic note   distal anteroseptal ischemia,  normal LVF  . COLONOSCOPY  last one 2006 (approx)  . CORONARY ANGIOPLASTY WITH STENT PLACEMENT  1999   DES x2  to CFX and RCA/  residual total occlusion LAD,  normal LVEF  . EYE SURGERY Bilateral    cataract  . POSTERIOR CERVICAL FUSION/FORAMINOTOMY N/A 05/26/2017   Procedure: RIGHT C4-5 FORAMINOTOMY WITH EXCISION OF HERNIATED [place];  Surgeon: Jessy Oto, MD;  Location: [facility];  Service: Orthopedics;  Laterality: N/A;  . RADIOACTIVE SEED IMPLANT N/A 01/11/2016   Procedure: RADIOACTIVE SEED IMPLANT/BRACHYTHERAPY IMPLANT;  Surgeon: Franchot Gallo, MD;   Location: [facility];  Service: Urology;  Laterality: N/A;   71  seeds implanted no seeds founds in bladder   Social History   Occupational History  . Occupation: Retired Clinical biochemist  Tobacco Use  . Smoking status: Former Smoker    Packs/day: 1.00    Years: 30.00    Pack years: 30.00    Types: Cigarettes    Last attempt to quit: 05/21/1989    Years since quitting: 28.6  . Smokeless tobacco: Never Used  Substance and Sexual Activity  . Alcohol use: Yes    Alcohol/week: 21.0 oz    Types: 14 Shots of liquor, 21 Standard drinks or equivalent per week    Comment: 2-3 oz per day  . Drug use: No  . Sexual activity: Not on file

## 2017-12-30 NOTE — Addendum Note (Signed)
Addended by: Basil Dess on: 12/30/2017 11:19 AM   Modules accepted: Orders

## 2017-12-30 NOTE — Patient Instructions (Signed)
Avoid overhead lifting and overhead use of the arms. Do not lift greater than 5 lbs. Adjust head rest in vehicle to prevent hyperextension if rear ended. Take extra precautions to avoid falling. Continue with right biceps and right shoulder exercises.

## 2017-12-30 NOTE — Addendum Note (Signed)
Addended by: Minda Ditto, Alyse Low N on: 12/30/2017 11:30 AM   Modules accepted: Orders

## 2018-01-01 ENCOUNTER — Ambulatory Visit (INDEPENDENT_AMBULATORY_CARE_PROVIDER_SITE_OTHER): Payer: Medicare HMO | Admitting: Specialist

## 2018-01-20 DIAGNOSIS — C61 Malignant neoplasm of prostate: Secondary | ICD-10-CM | POA: Diagnosis not present

## 2018-01-27 ENCOUNTER — Ambulatory Visit: Payer: Medicare HMO | Admitting: Urology

## 2018-01-27 DIAGNOSIS — N401 Enlarged prostate with lower urinary tract symptoms: Secondary | ICD-10-CM

## 2018-01-27 DIAGNOSIS — R351 Nocturia: Secondary | ICD-10-CM

## 2018-01-27 DIAGNOSIS — R300 Dysuria: Secondary | ICD-10-CM | POA: Diagnosis not present

## 2018-01-27 DIAGNOSIS — C61 Malignant neoplasm of prostate: Secondary | ICD-10-CM

## 2018-01-28 DIAGNOSIS — H26491 Other secondary cataract, right eye: Secondary | ICD-10-CM | POA: Diagnosis not present

## 2018-01-28 DIAGNOSIS — E1140 Type 2 diabetes mellitus with diabetic neuropathy, unspecified: Secondary | ICD-10-CM | POA: Diagnosis not present

## 2018-01-28 DIAGNOSIS — Z9842 Cataract extraction status, left eye: Secondary | ICD-10-CM | POA: Diagnosis not present

## 2018-01-28 DIAGNOSIS — Z961 Presence of intraocular lens: Secondary | ICD-10-CM | POA: Diagnosis not present

## 2018-01-28 DIAGNOSIS — E119 Type 2 diabetes mellitus without complications: Secondary | ICD-10-CM | POA: Diagnosis not present

## 2018-01-28 LAB — HM DIABETES EYE EXAM

## 2018-02-09 DIAGNOSIS — E1165 Type 2 diabetes mellitus with hyperglycemia: Secondary | ICD-10-CM | POA: Diagnosis not present

## 2018-02-13 ENCOUNTER — Telehealth: Payer: Self-pay | Admitting: Rheumatology

## 2018-02-13 DIAGNOSIS — Z79899 Other long term (current) drug therapy: Secondary | ICD-10-CM

## 2018-02-13 NOTE — Telephone Encounter (Signed)
Orders have been released  

## 2018-02-13 NOTE — Telephone Encounter (Signed)
Patient's wife called stating that Jacob Farmer will be getting bloodwork done at [facility] in [place] next week (02/16/18) and would like the orders sent.

## 2018-02-18 DIAGNOSIS — Z79899 Other long term (current) drug therapy: Secondary | ICD-10-CM | POA: Diagnosis not present

## 2018-02-18 LAB — COMPLETE METABOLIC PANEL WITH GFR
AG Ratio: 1.3 (calc) (ref 1.0–2.5)
ALT: 17 U/L (ref 9–46)
AST: 16 U/L (ref 10–35)
Albumin: 3.9 g/dL (ref 3.6–5.1)
Alkaline phosphatase (APISO): 116 U/L — ABNORMAL HIGH (ref 40–115)
BUN/Creatinine Ratio: 13 (calc) (ref 6–22)
BUN: 18 mg/dL (ref 7–25)
CO2: 30 mmol/L (ref 20–32)
Calcium: 9.4 mg/dL (ref 8.6–10.3)
Chloride: 105 mmol/L (ref 98–110)
Creat: 1.34 mg/dL — ABNORMAL HIGH (ref 0.70–1.18)
GFR, Est African American: 60 mL/min/{1.73_m2} (ref >=60)
GFR, Est Non African American: 52 mL/min/{1.73_m2} — ABNORMAL LOW (ref >=60)
Globulin: 3 g/dL (calc) (ref 1.9–3.7)
Glucose, Bld: 91 mg/dL (ref 65–139)
Potassium: 4.9 mmol/L (ref 3.5–5.3)
Sodium: 142 mmol/L (ref 135–146)
Total Bilirubin: 0.6 mg/dL (ref 0.2–1.2)
Total Protein: 6.9 g/dL (ref 6.1–8.1)

## 2018-02-18 LAB — CBC WITH DIFFERENTIAL/PLATELET
Basophils Absolute: 42 cells/uL (ref 0–200)
Basophils Relative: 0.6 %
Eosinophils Absolute: 546 cells/uL — ABNORMAL HIGH (ref 15–500)
Eosinophils Relative: 7.8 %
HCT: 43.3 % (ref 38.5–50.0)
Hemoglobin: 14.2 g/dL (ref 13.2–17.1)
Lymphs Abs: 1386 cells/uL (ref 850–3900)
MCH: 27.5 pg (ref 27.0–33.0)
MCHC: 32.8 g/dL (ref 32.0–36.0)
MCV: 83.9 fL (ref 80.0–100.0)
MPV: 11.7 fL (ref 7.5–12.5)
Monocytes Relative: 12.8 %
Neutro Abs: 4130 cells/uL (ref 1500–7800)
Neutrophils Relative %: 59 %
Platelets: 196 10*3/uL (ref 140–400)
RBC: 5.16 10*6/uL (ref 4.20–5.80)
RDW: 13.9 % (ref 11.0–15.0)
Total Lymphocyte: 19.8 %
WBC mixed population: 896 cells/uL (ref 200–950)
WBC: 7 10*3/uL (ref 3.8–10.8)

## 2018-04-16 NOTE — Progress Notes (Signed)
Office Visit Note  Patient: Jacob Farmer             Date of Birth: [birth year]           MRN: [id number]             PCP: Redmond School, MD Referring: Redmond School, MD Visit Date: 04/29/2018 Occupation: @GUAROCC @    Subjective:  Medication monitoring    History of Present Illness: JERET GOYER is a 72 y.o. male with history of seronegative rheumatoid arthritis, osteoarthritis, and gout.  He has had 1 gout flare since his last visit about 2 months.  He had to take 1 tablet of Colchicine and it resolved in 2 days.  He continues to take Allopurinol 100 mg daily.  He denies any recent rheumatoid arthritis flares.  He denies any joint pain or joint swelling at this time.  He denies any joint stiffness. She continues to take Arava 10 mg daily.  He has been exercising on a stationary bike 5 times a week.   Activities of Daily Living:  Patient reports morning stiffness for 0 minutes.   Patient Denies nocturnal pain.  Difficulty dressing/grooming: Denies Difficulty climbing stairs: Denies Difficulty getting out of chair: Denies Difficulty using hands for taps, buttons, cutlery, and/or writing: Denies   Review of Systems  Constitutional: Negative for fatigue and night sweats.  HENT: Positive for mouth dryness. Negative for mouth sores, trouble swallowing, trouble swallowing and nose dryness.   Eyes: Negative for redness, visual disturbance and dryness.  Respiratory: Negative for cough, hemoptysis, shortness of breath and difficulty breathing.   Cardiovascular: Negative for chest pain, palpitations, hypertension, irregular heartbeat and swelling in legs/feet.  Gastrointestinal: Negative for blood in stool, constipation and diarrhea.  Endocrine: Negative for increased urination.  Genitourinary: Negative for painful urination.  Musculoskeletal: Negative for arthralgias, joint pain, joint swelling, myalgias, muscle weakness, morning stiffness, muscle tenderness and myalgias.  Skin:  Negative for color change, rash, hair loss, nodules/bumps, skin tightness, ulcers and sensitivity to sunlight.  Allergic/Immunologic: Negative for susceptible to infections.  Neurological: Negative for dizziness, fainting, memory loss, night sweats and weakness.  Hematological: Negative for swollen glands.  Psychiatric/Behavioral: Negative for depressed mood and sleep disturbance. The patient is not nervous/anxious.     PMFS History:  Patient Active Problem List   Diagnosis Date Noted  . Herniated cervical disc 05/26/2017    Class: Acute  . Herniation of cervical intervertebral disc with radiculopathy 05/26/2017  . Cervical spinal stenosis 05/26/2017  . Lumbar disc disease 02/04/2017  . Primary osteoarthritis of both hands 02/04/2017  . History of gout 02/04/2017  . DDD (degenerative disc disease), lumbar 02/04/2017  . Pain in joint of left knee 02/04/2017  . High risk medication use 01/28/2017  . History of prostate cancer 01/28/2017  . History of renal insufficiency syndrome 01/28/2017  . Malignant neoplasm of prostate ([place]) 11/17/2015  . Chronic kidney disease, stage 3, mod decreased GFR (HCC) 12/27/2011  . Diabetes mellitus, type II ([place]) 12/27/2011  . Rheumatoid arthritis ([place]) 01/04/2011  . SINUS BRADYCARDIA 01/12/2010  . 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 HYPERLIPIDEMIA 01/08/2010  . Hypertension 01/08/2010    Past Medical History:  Diagnosis Date  . Arteriosclerotic cardiovascular disease (ASCVD) cardiologist-  dr Roderic [place] [facility]   Acute myocardial infarction treated with TPA in 1990; 1999-stents to circumflex and RCA; residual total occlusion of the left anterior descending; normal ejection fraction; stress nuclear in 2001-distal anteroseptal ischemia; normal LV function  . Chronic  kidney disease, stage 3, mod decreased GFR ([place]) nephrologist-  at [facility] in [place]   Creatinine-2.18 and 12/2007, 1.76 and 12/2008  . First degree heart  block   . History of gastric ulcer   . History of kidney stones    05/23/17- small = no pain- watching  . History of MI (myocardial infarction)    1990-  treated w/ TPA  . Hyperlipidemia   . Hypertension   . Myocardial infarction ([place]) 1990  . OA (osteoarthritis)   . Pre-diabetes   . Prediabetes   . Prostate cancer [facility]) urologist-- dr dahlstedt/  oncologist- dr Tammi Klippel   Stage T1c , Gleason 3+4,  PSA 4.7,  vol 24cc--  scheduled for radiative seed implants  . Psoriatic arthritis ([place])   . RA (rheumatoid arthritis) ([place])    rheumologist-  dr Toni Amend  . S/P drug eluting coronary stent placement    1999--  DES x2 to CFX and RCA  . Sinus bradycardia   . Wears dentures    upper and lower partial    Family History  Problem Relation Age of Onset  . Heart attack Mother   . Coronary artery disease Father    Past Surgical History:  Procedure Laterality Date  . CARDIOVASCULAR STRESS TEST  2001  per Dr Lattie Haw clinic note   distal anteroseptal ischemia,  normal LVF  . COLONOSCOPY  last one 2006 (approx)  . CORONARY ANGIOPLASTY WITH STENT PLACEMENT  1999   DES x2  to CFX and RCA/  residual total occlusion LAD,  normal LVEF  . EYE SURGERY Bilateral    cataract  . POSTERIOR CERVICAL FUSION/FORAMINOTOMY N/A 05/26/2017   Procedure: RIGHT C4-5 FORAMINOTOMY WITH EXCISION OF HERNIATED [place];  Surgeon: Jessy Oto, MD;  Location: [place];  Service: Orthopedics;  Laterality: N/A;  . RADIOACTIVE SEED IMPLANT N/A 01/11/2016   Procedure: RADIOACTIVE SEED IMPLANT/BRACHYTHERAPY IMPLANT;  Surgeon: Franchot Gallo, MD;  Location: [facility];  Service: Urology;  Laterality: N/A;   71  seeds implanted no seeds founds in bladder   Social History   Social History Narrative   No regular exercise     Objective: Vital Signs: BP (!) 145/72 (BP Location: Left Arm, Patient Position: Sitting, Cuff Size: Large)   Pulse (!) 51   Resp 14   Ht 5\' 10"  (1.778 m)   Wt 198 lb (89.8 kg)   BMI  28.41 kg/m    Physical Exam  Constitutional: He is oriented to person, place, and time. He appears well-developed and well-nourished.  HENT:  Head: Normocephalic and atraumatic.  Eyes: Pupils are equal, round, and reactive to light. Conjunctivae and EOM are normal.  Neck: Normal range of motion. Neck supple.  Cardiovascular: Normal rate, regular rhythm and normal heart sounds.  Pulmonary/Chest: Effort normal and breath sounds normal.  Abdominal: Soft. Bowel sounds are normal.  Lymphadenopathy:    He has no cervical adenopathy.  Neurological: He is alert and oriented to person, place, and time.  Skin: Skin is warm and dry. Capillary refill takes less than 2 seconds.  Psychiatric: He has a normal mood and affect. His behavior is normal.  Nursing note and vitals reviewed.    Musculoskeletal Exam: C-spine, thoracic spine, lumbar spine good ROM.  Shoulder joints, elbow joints, wrist joints, MCPs, PIPs, and DIPs good ROM with no synovitis. PIP and DIP synovial thickening and CMC joint synovial thickening consistent with osteoarthritis. Hip joints, knee joints, ankle joints, MTPs, PIPs, and DIPs good ROM with  no synovitis.  No warmth or effusion of knee joints.     CDAI Exam: CDAI Homunculus Exam:   Joint Counts:  CDAI Tender Joint count: 0 CDAI Swollen Joint count: 0  Global Assessments:  Patient Global Assessment: 1 Provider Global Assessment: 1  CDAI Calculated Score: 2    Investigation: No additional findings.uric acid: 10/31/2017 6.2 CBC Latest Ref Rng & Units 02/18/2018 10/31/2017 05/26/2017  WBC 3.8 - 10.8 Thousand/uL 7.0 7.4 7.9  Hemoglobin 13.2 - 17.1 g/dL 14.2 12.9(L) 12.9(L)  Hematocrit 38.5 - 50.0 % 43.3 39.3 39.1  Platelets 140 - 400 Thousand/uL 196 224 142(L)   CMP Latest Ref Rng & Units 02/18/2018 10/31/2017 05/26/2017  Glucose 65 - 139 mg/dL 91 182(H) 138(H)  BUN 7 - 25 mg/dL 18 16 15   Creatinine 0.70 - 1.18 mg/dL 1.34(H) 1.43(H) 1.19  Sodium 135 - 146 mmol/L 142  141 139  Potassium 3.5 - 5.3 mmol/L 4.9 5.2 4.0  Chloride 98 - 110 mmol/L 105 105 109  CO2 20 - 32 mmol/L 30 30 23   Calcium 8.6 - 10.3 mg/dL 9.4 8.9 8.6(L)  Total Protein 6.1 - 8.1 g/dL 6.9 6.4 6.0(L)  Total Bilirubin 0.2 - 1.2 mg/dL 0.6 0.6 0.9  Alkaline Phos 38 - 126 U/L - - 83  AST 10 - 35 U/L 16 14 18   ALT 9 - 46 U/L 17 14 20     Imaging: Xr Foot 2 Views Left  Result Date: 04/29/2018 First MTP, all PIP and DIP narrowing was noted.  Juxta articular osteopenia is noted.  No erosive changes were noted.  No intertarsal joint space narrowing was noted.  Possible subtalar joint space narrowing was noted.  A small calcaneal spur was noted. Impression: These findings are consistent with osteoarthritis and rheumatoid arthritis overlap.  Xr Foot 2 Views Right  Result Date: 04/29/2018 First MTP, all PIP and DIP narrowing was noted.  Juxta articular osteopenia is noted.  No erosive changes were noted.  No intertarsal joint space narrowing was noted.  Possible subtalar joint space narrowing was noted.  A small calcaneal spur was noted. Impression: These findings are consistent with osteoarthritis and rheumatoid arthritis overlap.  Xr Hand 2 View Left  Result Date: 04/29/2018 Severe CMC narrowing was noted.  First MCP and second MCP narrowing was noted.    PIP and DIP narrowing was noted.  Juxta-articular osteopenia was noted.  No erosive changes were noted.  No intercarpal joint space narrowing was noted.  No radiocarpal joint space narrowing was noted. Impression: These findings are consistent with rheumatoid arthritis and osteoarthritis overlap.  Xr Hand 2 View Right  Result Date: 04/29/2018 Severe CMC narrowing was noted.  First MCP and second MCP narrowing was noted.    PIP and DIP narrowing was noted.  Juxta-articular osteopenia was noted.  No erosive changes were noted.  No intercarpal joint space narrowing was noted.  No radiocarpal joint space narrowing was noted. Impression: These findings  are consistent with rheumatoid arthritis and osteoarthritis overlap.   Speciality Comments: No specialty comments available.    Procedures:  No procedures performed Allergies: No known allergies   Assessment / Plan:     Visit Diagnoses: Seronegative rheumatoid arthritis ([place]) - He has no active synovitis on exam.  He has not had any joint pain or joint swelling.  He has no morning stiffness. He has not had any recent rheumatoid arthritis flares.  He is clinically doing well on Arava 10 mg by mouth daily.  A refill of  [name] was sent to the pharmacy.  X-rays of his hands and feet were obtained today to assess for further damage or erosions.  Plan: XR Hand 2 View Left, XR Hand 2 View Right, XR Foot 2 Views Left, XR Foot 2 Views Right.  The x-rays of the bilateral hands and feet did not show any erosive changes.  The findings are consistent with rheumatoid arthritis and osteoarthritis overlap.  High risk medication use - Arava 10 mg by mouth daily.  CBC and CMP were drawn today to monitor for drug toxicity.  He will return in August and every 3 months for lab work.- Plan: CBC with Differential/Platelet, COMPLETE METABOLIC PANEL WITH GFR  Primary osteoarthritis of both hands: He has PIP, DIP, and CMC joint synovial thickening consistent with osteoarthritis of bilateral hands.  Joint protection muscle strengthening were discussed.  Idiopathic chronic gout of multiple sites without tophus -he developed some pain in his right ankle about 2 months ago and took 1 dose of colchicine and his symptoms resolved within 2 days.  He continues to take allopurinol 100 mg by mouth daily.  A refill of allopurinol was sent to the pharmacy.  We will contact him when we have samples of Medicare.  His most recent uric acid: 10/31/2017 6.2.  We will recheck uric acid level today.- Plan: Uric acid  DDD (degenerative disc disease), lumbar: No midline spinal tenderness.  No discomfort at this time.   Other medical  conditions are listed as follows:   History of renal insufficiency syndrome - He is been followed by nephrologist.  History of prostate cancer  History of gastroesophageal reflux (GERD)  History of hypercholesterolemia  History of coronary artery disease  History of hypertension    Orders: Orders Placed This Encounter  Procedures  . XR Hand 2 View Left  . XR Hand 2 View Right  . XR Foot 2 Views Left  . XR Foot 2 Views Right  . CBC with Differential/Platelet  . COMPLETE METABOLIC PANEL WITH GFR  . Uric acid   Meds ordered this encounter  Medications  . leflunomide (ARAVA) 10 MG tablet    Sig: Take 1 tablet (10 mg total) by mouth daily.    Dispense:  90 tablet    Refill:  0  . allopurinol (ZYLOPRIM) 100 MG tablet    Sig: Take 1 tablet (100 mg total) by mouth daily.    Dispense:  90 tablet    Refill:  1    Face-to-face time spent with patient was 30 minutes.> 50% of time was spent in counseling and coordination of care.  Follow-Up Instructions: Return in about 5 months (around 09/29/2018) for Rheumatoid arthritis, Osteoarthritis, Gout.   Bo Merino, MD  Note - This record has been created using Editor, commissioning.  Chart creation errors have been sought, but may not always  have been located. Such creation errors do not reflect on  the standard of medical care.

## 2018-04-29 ENCOUNTER — Ambulatory Visit (INDEPENDENT_AMBULATORY_CARE_PROVIDER_SITE_OTHER): Payer: Self-pay

## 2018-04-29 ENCOUNTER — Ambulatory Visit: Payer: Medicare HMO | Admitting: Rheumatology

## 2018-04-29 ENCOUNTER — Encounter: Payer: Self-pay | Admitting: Rheumatology

## 2018-04-29 ENCOUNTER — Ambulatory Visit (INDEPENDENT_AMBULATORY_CARE_PROVIDER_SITE_OTHER): Payer: Medicare HMO

## 2018-04-29 VITALS — BP 145/72 | HR 51 | Resp 14 | Ht 70.0 in | Wt 198.0 lb

## 2018-04-29 DIAGNOSIS — M19042 Primary osteoarthritis, left hand: Secondary | ICD-10-CM

## 2018-04-29 DIAGNOSIS — M0600 Rheumatoid arthritis without rheumatoid factor, unspecified site: Secondary | ICD-10-CM | POA: Diagnosis not present

## 2018-04-29 DIAGNOSIS — M19041 Primary osteoarthritis, right hand: Secondary | ICD-10-CM | POA: Diagnosis not present

## 2018-04-29 DIAGNOSIS — Z79899 Other long term (current) drug therapy: Secondary | ICD-10-CM

## 2018-04-29 DIAGNOSIS — Z8546 Personal history of malignant neoplasm of prostate: Secondary | ICD-10-CM | POA: Diagnosis not present

## 2018-04-29 DIAGNOSIS — Z8679 Personal history of other diseases of the circulatory system: Secondary | ICD-10-CM | POA: Diagnosis not present

## 2018-04-29 DIAGNOSIS — Z8639 Personal history of other endocrine, nutritional and metabolic disease: Secondary | ICD-10-CM

## 2018-04-29 DIAGNOSIS — Z8719 Personal history of other diseases of the digestive system: Secondary | ICD-10-CM

## 2018-04-29 DIAGNOSIS — M1A09X0 Idiopathic chronic gout, multiple sites, without tophus (tophi): Secondary | ICD-10-CM

## 2018-04-29 DIAGNOSIS — M5136 Other intervertebral disc degeneration, lumbar region: Secondary | ICD-10-CM | POA: Diagnosis not present

## 2018-04-29 DIAGNOSIS — Z87448 Personal history of other diseases of urinary system: Secondary | ICD-10-CM | POA: Diagnosis not present

## 2018-04-29 LAB — COMPLETE METABOLIC PANEL WITH GFR
AG Ratio: 1.3 (calc) (ref 1.0–2.5)
ALT: 20 U/L (ref 9–46)
AST: 16 U/L (ref 10–35)
Albumin: 3.9 g/dL (ref 3.6–5.1)
Alkaline phosphatase (APISO): 118 U/L — ABNORMAL HIGH (ref 40–115)
BUN/Creatinine Ratio: 11 (calc) (ref 6–22)
BUN: 14 mg/dL (ref 7–25)
CO2: 31 mmol/L (ref 20–32)
Calcium: 9.5 mg/dL (ref 8.6–10.3)
Chloride: 105 mmol/L (ref 98–110)
Creat: 1.32 mg/dL — ABNORMAL HIGH (ref 0.70–1.18)
GFR, Est African American: 62 mL/min/{1.73_m2} (ref >=60)
GFR, Est Non African American: 53 mL/min/{1.73_m2} — ABNORMAL LOW (ref >=60)
Globulin: 3.1 g/dL (calc) (ref 1.9–3.7)
Glucose, Bld: 103 mg/dL — ABNORMAL HIGH (ref 65–99)
Potassium: 4.9 mmol/L (ref 3.5–5.3)
Sodium: 142 mmol/L (ref 135–146)
Total Bilirubin: 0.6 mg/dL (ref 0.2–1.2)
Total Protein: 7 g/dL (ref 6.1–8.1)

## 2018-04-29 LAB — CBC WITH DIFFERENTIAL/PLATELET
Basophils Absolute: 38 cells/uL (ref 0–200)
Basophils Relative: 0.5 %
Eosinophils Absolute: 698 cells/uL — ABNORMAL HIGH (ref 15–500)
Eosinophils Relative: 9.3 %
HCT: 42.4 % (ref 38.5–50.0)
Hemoglobin: 14.3 g/dL (ref 13.2–17.1)
Lymphs Abs: 1658 cells/uL (ref 850–3900)
MCH: 28.7 pg (ref 27.0–33.0)
MCHC: 33.7 g/dL (ref 32.0–36.0)
MCV: 85.1 fL (ref 80.0–100.0)
MPV: 12 fL (ref 7.5–12.5)
Monocytes Relative: 13.1 %
Neutro Abs: 4125 cells/uL (ref 1500–7800)
Neutrophils Relative %: 55 %
Platelets: 181 10*3/uL (ref 140–400)
RBC: 4.98 10*6/uL (ref 4.20–5.80)
RDW: 13.6 % (ref 11.0–15.0)
Total Lymphocyte: 22.1 %
WBC mixed population: 983 cells/uL — ABNORMAL HIGH (ref 200–950)
WBC: 7.5 10*3/uL (ref 3.8–10.8)

## 2018-04-29 LAB — URIC ACID: Uric Acid, Serum: 6 mg/dL (ref 4.0–8.0)

## 2018-04-29 MED ORDER — LEFLUNOMIDE 10 MG PO TABS: 10.0000 mg | ORAL_TABLET | Freq: Every day | ORAL | 0 refills | Status: DC

## 2018-04-29 MED ORDER — ALLOPURINOL 100 MG PO TABS: 100.0000 mg | ORAL_TABLET | Freq: Every day | ORAL | 1 refills | Status: DC

## 2018-04-30 NOTE — Progress Notes (Signed)
Labs are stable.  We will continue to monitor.  Uric acid is 6 and within desirable range.

## 2018-05-01 ENCOUNTER — Telehealth: Payer: Self-pay | Admitting: Rheumatology

## 2018-05-01 NOTE — Telephone Encounter (Signed)
Patient's wife called stating she was returning your call regarding Jacob Farmer's labwork.

## 2018-05-04 NOTE — Telephone Encounter (Signed)
Advised patient's wife of lab results.

## 2018-05-05 ENCOUNTER — Telehealth: Payer: Self-pay | Admitting: Rheumatology

## 2018-05-05 DIAGNOSIS — Z79899 Other long term (current) drug therapy: Secondary | ICD-10-CM | POA: Diagnosis not present

## 2018-05-05 DIAGNOSIS — R809 Proteinuria, unspecified: Secondary | ICD-10-CM | POA: Diagnosis not present

## 2018-05-05 DIAGNOSIS — E559 Vitamin D deficiency, unspecified: Secondary | ICD-10-CM | POA: Diagnosis not present

## 2018-05-05 NOTE — Telephone Encounter (Signed)
Patient's wife left a voicemail stating she was returning Andrea's call regarding "getting free samples for his gout."

## 2018-05-06 NOTE — Telephone Encounter (Signed)
Attempted to call patient and no answer nor voicemail available.

## 2018-05-06 NOTE — Telephone Encounter (Signed)
Office note from 04/29/2018 states "he developed some pain in his right ankle about 2 months ago and took 1 dose of colchicine and his symptoms resolved within 2 days.  He continues to take allopurinol 100 mg by mouth daily.  A refill of allopurinol was sent to the pharmacy.  We will contact him when we have samples of Medicare." Uric acid on 04/29/2018 6.0.   Okay to provide samples of mitigare?

## 2018-05-06 NOTE — Telephone Encounter (Signed)
O to give samples of Mitigare. If he is having frequent flares then I would increase Allopurinol to 200 mg po qd. Please, ask Pt.

## 2018-05-12 DIAGNOSIS — E1129 Type 2 diabetes mellitus with other diabetic kidney complication: Secondary | ICD-10-CM | POA: Diagnosis not present

## 2018-05-12 DIAGNOSIS — N183 Chronic kidney disease, stage 3 (moderate): Secondary | ICD-10-CM | POA: Diagnosis not present

## 2018-05-12 DIAGNOSIS — R809 Proteinuria, unspecified: Secondary | ICD-10-CM | POA: Diagnosis not present

## 2018-05-12 DIAGNOSIS — I10 Essential (primary) hypertension: Secondary | ICD-10-CM | POA: Diagnosis not present

## 2018-05-22 ENCOUNTER — Telehealth: Payer: Self-pay | Admitting: Rheumatology

## 2018-05-22 NOTE — Telephone Encounter (Signed)
Patient's wife asked if we had any mitigare samples. Advised we do not but will call when we get some in.

## 2018-05-22 NOTE — Telephone Encounter (Signed)
Patient's wife called stating she was returning a call to the office.

## 2018-06-15 DIAGNOSIS — D485 Neoplasm of uncertain behavior of skin: Secondary | ICD-10-CM | POA: Diagnosis not present

## 2018-06-15 DIAGNOSIS — R201 Hypoesthesia of skin: Secondary | ICD-10-CM | POA: Diagnosis not present

## 2018-06-15 DIAGNOSIS — E119 Type 2 diabetes mellitus without complications: Secondary | ICD-10-CM | POA: Diagnosis not present

## 2018-06-15 DIAGNOSIS — L309 Dermatitis, unspecified: Secondary | ICD-10-CM | POA: Diagnosis not present

## 2018-06-15 DIAGNOSIS — I10 Essential (primary) hypertension: Secondary | ICD-10-CM | POA: Diagnosis not present

## 2018-06-15 DIAGNOSIS — N400 Enlarged prostate without lower urinary tract symptoms: Secondary | ICD-10-CM | POA: Diagnosis not present

## 2018-06-15 DIAGNOSIS — C61 Malignant neoplasm of prostate: Secondary | ICD-10-CM | POA: Diagnosis not present

## 2018-06-15 DIAGNOSIS — Z6829 Body mass index (BMI) 29.0-29.9, adult: Secondary | ICD-10-CM | POA: Diagnosis not present

## 2018-06-15 DIAGNOSIS — E1129 Type 2 diabetes mellitus with other diabetic kidney complication: Secondary | ICD-10-CM | POA: Diagnosis not present

## 2018-06-15 DIAGNOSIS — Z1389 Encounter for screening for other disorder: Secondary | ICD-10-CM | POA: Diagnosis not present

## 2018-06-24 ENCOUNTER — Ambulatory Visit (INDEPENDENT_AMBULATORY_CARE_PROVIDER_SITE_OTHER): Payer: Medicare HMO | Admitting: Cardiology

## 2018-06-24 ENCOUNTER — Encounter: Payer: Self-pay | Admitting: Cardiology

## 2018-06-24 VITALS — BP 148/48 | HR 58 | Ht 70.0 in | Wt 198.0 lb

## 2018-06-24 DIAGNOSIS — I2510 Atherosclerotic heart disease of native coronary artery without angina pectoris: Secondary | ICD-10-CM | POA: Diagnosis not present

## 2018-06-24 DIAGNOSIS — I10 Essential (primary) hypertension: Secondary | ICD-10-CM | POA: Diagnosis not present

## 2018-06-24 DIAGNOSIS — E782 Mixed hyperlipidemia: Secondary | ICD-10-CM

## 2018-06-24 MED ORDER — AMLODIPINE BESYLATE 10 MG PO TABS: 10.0000 mg | ORAL_TABLET | Freq: Every day | ORAL | 3 refills | Status: DC

## 2018-06-24 NOTE — Progress Notes (Signed)
Clinical Summary Jacob Farmer is a 72 y.o.male seen today for follow up of the following medical problems.    1. CAD - multiple interventions as described below - normal LV function per prior clinic notes, there is no documented study in our sytem   - no recent symptoms - compliant with meds    2. HTN - have deferred consideration of ACE to nephrology, has not been on due to CKD  - compliant with meds  3. Hyperlipidemia - remains compliant with statin   4. CKD III - last GFR 53 - followed by renal  5. Prostate CA - had radioactive seeds placed, followed by urology.         SH: Daughter lives in [place]. Has stepson that is a [employer]ud[employer]. He is former marine.     Past Medical History:  Diagnosis Date  . Arteriosclerotic cardiovascular disease (ASCVD) cardiologist-  dr Roderic Palau [place] A[place]   Acute myocardial infarction treated with TPA in 1990; 1999-stents to circumflex and RCA; residual total occlusion of the left anterior descending; normal ejection fraction; stress nuclear in 2001-distal anteroseptal ischemia; normal LV function  . Chronic kidney disease, stage 3, mod decreased GFR ([place]) nephrologist-  at [facility] in [place]   Creatinine-2.18 and 12/2007, 1.76 and 12/2008  . First degree heart block   . History of gastric ulcer   . History of kidney stones    05/23/17- small = no pain- watching  . History of MI (myocardial infarction)    1990-  treated w/ TPA  . Hyperlipidemia   . Hypertension   . Myocardial infarction ([place]) 1990  . OA (osteoarthritis)   . Pre-diabetes   . Prediabetes   . Prostate cancer [facility]) urologist-- dr dahlstedt/  oncologist- dr Tammi Klippel   Stage T1c , Gleason 3+4,  PSA 4.7,  vol 24cc--  scheduled for radiative seed implants  . Psoriatic arthritis ([place])   . RA (rheumatoid arthritis) ([place])    rheumologist-  dr Toni Amend  . S/P drug eluting coronary stent placement    1999--  DES x2 to CFX and RCA  .  Sinus bradycardia   . Wears dentures    upper and lower partial     Allergies  Allergen Reactions  . No Known Allergies      Current Outpatient Medications  Medication Sig Dispense Refill  . allopurinol (ZYLOPRIM) 100 MG tablet Take 1 tablet (100 mg total) by mouth daily. 90 tablet 1  . amLODipine (NORVASC) 5 MG tablet Take 1 tablet (5 mg total) by mouth daily. (Patient taking differently: Take 5 mg by mouth every morning. ) 90 tablet 3  . aspirin 81 MG tablet Take 81 mg by mouth every other day.     Marland Kitchen atorvastatin (LIPITOR) 80 MG tablet Take 1 tablet (80 mg total) by mouth daily. (Patient taking differently: Take 80 mg by mouth every evening. ) 90 tablet 3  . gabapentin (NEURONTIN) 300 MG capsule Take one tablet po qhs for 4 days then one tablet po BID for 4 days then One tablet po TID. If side effects, dizziness, fatigue, sedation or forgetfulness then decrease by one dose. (Patient taking differently: Take 300 mg by mouth at bedtime. Take one tablet po qhs for 4 days then one tablet po BID for 4 days then One tablet po TID. If side effects, dizziness, fatigue, sedation or forgetfulness then decrease by one dose.) 270 capsule 3  . glimepiride (AMARYL) 1 MG tablet Take 1 mg  by mouth daily before breakfast.    . leflunomide (ARAVA) 10 MG tablet Take 1 tablet (10 mg total) by mouth daily. 90 tablet 0  . metoprolol (LOPRESSOR) 50 MG tablet TAKE ONE TABLET BY MOUTH TWICE DAILY 60 tablet 6  . Omega-3 Fatty Acids (FISH OIL) 1000 MG CAPS Take 1,000 mg by mouth daily.     No current facility-administered medications for this visit.      Past Surgical History:  Procedure Laterality Date  . CARDIOVASCULAR STRESS TEST  2001  per Dr Lattie Haw clinic note   distal anteroseptal ischemia,  normal LVF  . COLONOSCOPY  last one 2006 (approx)  . CORONARY ANGIOPLASTY WITH STENT PLACEMENT  1999   DES x2  to CFX and RCA/  residual total occlusion LAD,  normal LVEF  . EYE SURGERY Bilateral    cataract   . POSTERIOR CERVICAL FUSION/FORAMINOTOMY N/A 05/26/2017   Procedure: RIGHT C4-5 FORAMINOTOMY WITH EXCISION OF HERNIATED [place];  Surgeon: Jessy Oto, MD;  Location: [facility];  Service: Orthopedics;  Laterality: N/A;  . RADIOACTIVE SEED IMPLANT N/A 01/11/2016   Procedure: RADIOACTIVE SEED IMPLANT/BRACHYTHERAPY IMPLANT;  Surgeon: Franchot Gallo, MD;  Location: [facility];  Service: Urology;  Laterality: N/A;   71  seeds implanted no seeds founds in bladder     Allergies  Allergen Reactions  . No Known Allergies       Family History  Problem Relation Age of Onset  . Heart attack Mother   . Coronary artery disease Father      Social History Jacob Farmer reports that he quit smoking about 29 years ago. His smoking use included cigarettes. He has a 30.00 pack-year smoking history. He has never used smokeless tobacco. Jacob Farmer reports that he drinks about 16.8 oz of alcohol per week.   Review of Systems CONSTITUTIONAL: No weight loss, fever, chills, weakness or fatigue.  HEENT: Eyes: No visual loss, blurred vision, double vision or yellow sclerae.No hearing loss, sneezing, congestion, runny nose or sore throat.  SKIN: No rash or itching.  CARDIOVASCULAR: per hpi RESPIRATORY: No shortness of breath, cough or sputum.  GASTROINTESTINAL: No anorexia, nausea, vomiting or diarrhea. No abdominal pain or blood.  GENITOURINARY: No burning on urination, no polyuria NEUROLOGICAL: No headache, dizziness, syncope, paralysis, ataxia, numbness or tingling in the extremities. No change in bowel or bladder control.  MUSCULOSKELETAL: No muscle, back pain, joint pain or stiffness.  LYMPHATICS: No enlarged nodes. No history of splenectomy.  PSYCHIATRIC: No history of depression or anxiety.  ENDOCRINOLOGIC: No reports of sweating, cold or heat intolerance. No polyuria or polydipsia.  Marland Kitchen   Physical Examination Vitals:   06/24/18 0809  BP: (!) 148/48  Pulse: (!) 58  SpO2: 96%    Filed Weights   06/24/18 0809  Weight: 198 lb (89.8 kg)    Gen: resting comfortably, no acute distress HEENT: no scleral icterus, pupils equal round and reactive, no palptable cervical adenopathy,  CV: RRR, no m/r/g, no jvd Resp: Clear to auscultation bilaterally GI: abdomen is soft, non-tender, non-distended, normal bowel sounds, no hepatosplenomegaly MSK: extremities are warm, no edema.  Skin: warm, no rash Neuro:  no focal deficits Psych: appropriate affect   Diagnostic Studies     Assessment and Plan   1. CAD - no symptoms, continue current meds - EKG today show SR, RBBB, LPFB. No acute ischemic changes  2. HTN - manual bp 160/75, above goal - increase norvasc to 10mg  daily - we have deferred consideration  for ACE-I to nephrology  3. Hyperlipidemia - continue statin, request pcp labs.     F/u 1 year      Arnoldo Lenis, M.D.

## 2018-06-24 NOTE — Patient Instructions (Signed)
Medication Instructions:  INCREASE NORVASC TO 10 MG DAILY   Labwork: I WILL REQUEST LABS FROM PCP  Testing/Procedures: NONE  Follow-Up: Your physician wants you to follow-up in: 1 YEAR  You will receive a reminder letter in the mail two months in advance. If you don't receive a letter, please call our office to schedule the follow-up appointment.   Any Other Special Instructions Will Be Listed Below (If Applicable).     If you need a refill on your cardiac medications before your next appointment, please call your pharmacy.

## 2018-07-17 DIAGNOSIS — Z6829 Body mass index (BMI) 29.0-29.9, adult: Secondary | ICD-10-CM | POA: Diagnosis not present

## 2018-07-17 DIAGNOSIS — E663 Overweight: Secondary | ICD-10-CM | POA: Diagnosis not present

## 2018-07-17 DIAGNOSIS — I10 Essential (primary) hypertension: Secondary | ICD-10-CM | POA: Diagnosis not present

## 2018-07-17 DIAGNOSIS — N400 Enlarged prostate without lower urinary tract symptoms: Secondary | ICD-10-CM | POA: Diagnosis not present

## 2018-07-17 DIAGNOSIS — L309 Dermatitis, unspecified: Secondary | ICD-10-CM | POA: Diagnosis not present

## 2018-07-17 DIAGNOSIS — E1129 Type 2 diabetes mellitus with other diabetic kidney complication: Secondary | ICD-10-CM | POA: Diagnosis not present

## 2018-07-17 DIAGNOSIS — E782 Mixed hyperlipidemia: Secondary | ICD-10-CM | POA: Diagnosis not present

## 2018-07-17 DIAGNOSIS — Z1389 Encounter for screening for other disorder: Secondary | ICD-10-CM | POA: Diagnosis not present

## 2018-07-17 DIAGNOSIS — Z0001 Encounter for general adult medical examination with abnormal findings: Secondary | ICD-10-CM | POA: Diagnosis not present

## 2018-07-20 ENCOUNTER — Telehealth: Payer: Self-pay | Admitting: Rheumatology

## 2018-07-20 DIAGNOSIS — Z79899 Other long term (current) drug therapy: Secondary | ICD-10-CM

## 2018-07-20 NOTE — Telephone Encounter (Signed)
Patient called requesting his labwork orders be sent to [facility] on [address] in [place].  Patient states he is going tomorrow 07/20/18.

## 2018-07-20 NOTE — Telephone Encounter (Signed)
Advised patient that lab orders have been released. Patient verbalized understanding.

## 2018-07-21 DIAGNOSIS — C61 Malignant neoplasm of prostate: Secondary | ICD-10-CM | POA: Diagnosis not present

## 2018-07-21 DIAGNOSIS — Z79899 Other long term (current) drug therapy: Secondary | ICD-10-CM | POA: Diagnosis not present

## 2018-07-21 LAB — CBC WITH DIFFERENTIAL/PLATELET
Basophils Absolute: 42 cells/uL (ref 0–200)
Basophils Relative: 0.5 %
Eosinophils Absolute: 361 cells/uL (ref 15–500)
Eosinophils Relative: 4.3 %
HCT: 43.4 % (ref 38.5–50.0)
Hemoglobin: 14.7 g/dL (ref 13.2–17.1)
Lymphs Abs: 1218 cells/uL (ref 850–3900)
MCH: 29.3 pg (ref 27.0–33.0)
MCHC: 33.9 g/dL (ref 32.0–36.0)
MCV: 86.5 fL (ref 80.0–100.0)
MPV: 11.8 fL (ref 7.5–12.5)
Monocytes Relative: 12.2 %
Neutro Abs: 5754 cells/uL (ref 1500–7800)
Neutrophils Relative %: 68.5 %
Platelets: 174 10*3/uL (ref 140–400)
RBC: 5.02 10*6/uL (ref 4.20–5.80)
RDW: 13.5 % (ref 11.0–15.0)
Total Lymphocyte: 14.5 %
WBC mixed population: 1025 cells/uL — ABNORMAL HIGH (ref 200–950)
WBC: 8.4 10*3/uL (ref 3.8–10.8)

## 2018-07-21 LAB — COMPLETE METABOLIC PANEL WITH GFR
AG Ratio: 1.6 (calc) (ref 1.0–2.5)
ALT: 27 U/L (ref 9–46)
AST: 18 U/L (ref 10–35)
Albumin: 4.2 g/dL (ref 3.6–5.1)
Alkaline phosphatase (APISO): 109 U/L (ref 40–115)
BUN/Creatinine Ratio: 15 (calc) (ref 6–22)
BUN: 19 mg/dL (ref 7–25)
CO2: 28 mmol/L (ref 20–32)
Calcium: 9.7 mg/dL (ref 8.6–10.3)
Chloride: 104 mmol/L (ref 98–110)
Creat: 1.23 mg/dL — ABNORMAL HIGH (ref 0.70–1.18)
GFR, Est African American: 67 mL/min/{1.73_m2} (ref >=60)
GFR, Est Non African American: 58 mL/min/{1.73_m2} — ABNORMAL LOW (ref >=60)
Globulin: 2.7 g/dL (calc) (ref 1.9–3.7)
Glucose, Bld: 103 mg/dL (ref 65–139)
Potassium: 5 mmol/L (ref 3.5–5.3)
Sodium: 141 mmol/L (ref 135–146)
Total Bilirubin: 0.6 mg/dL (ref 0.2–1.2)
Total Protein: 6.9 g/dL (ref 6.1–8.1)

## 2018-07-22 NOTE — Telephone Encounter (Signed)
Labs are stable.

## 2018-07-26 DIAGNOSIS — Z1211 Encounter for screening for malignant neoplasm of colon: Secondary | ICD-10-CM | POA: Diagnosis not present

## 2018-07-28 ENCOUNTER — Ambulatory Visit: Payer: Medicare HMO | Admitting: Urology

## 2018-07-28 DIAGNOSIS — N529 Male erectile dysfunction, unspecified: Secondary | ICD-10-CM

## 2018-07-28 DIAGNOSIS — N401 Enlarged prostate with lower urinary tract symptoms: Secondary | ICD-10-CM | POA: Diagnosis not present

## 2018-07-28 DIAGNOSIS — C61 Malignant neoplasm of prostate: Secondary | ICD-10-CM | POA: Diagnosis not present

## 2018-07-28 DIAGNOSIS — R300 Dysuria: Secondary | ICD-10-CM

## 2018-08-05 DIAGNOSIS — I872 Venous insufficiency (chronic) (peripheral): Secondary | ICD-10-CM | POA: Diagnosis not present

## 2018-08-05 DIAGNOSIS — L308 Other specified dermatitis: Secondary | ICD-10-CM | POA: Diagnosis not present

## 2018-08-19 ENCOUNTER — Telehealth (INDEPENDENT_AMBULATORY_CARE_PROVIDER_SITE_OTHER): Payer: Self-pay | Admitting: Specialist

## 2018-08-19 NOTE — Telephone Encounter (Signed)
Patient wife called requesting an order for an injection with Dr. Ernestina Patches be put in, she states her husbands back is really hurting him---last ov was 12/2017

## 2018-08-19 NOTE — Telephone Encounter (Signed)
Patient wife called requesting an order for an injection with Dr. Ernestina Patches be put in, she states her husbands back is really hurting him. CB # [phone]

## 2018-08-26 ENCOUNTER — Telehealth (INDEPENDENT_AMBULATORY_CARE_PROVIDER_SITE_OTHER): Payer: Self-pay | Admitting: Specialist

## 2018-08-26 ENCOUNTER — Other Ambulatory Visit (INDEPENDENT_AMBULATORY_CARE_PROVIDER_SITE_OTHER): Payer: Self-pay | Admitting: Specialist

## 2018-08-26 DIAGNOSIS — M48062 Spinal stenosis, lumbar region with neurogenic claudication: Secondary | ICD-10-CM
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
## 2018-09-08 ENCOUNTER — Ambulatory Visit (INDEPENDENT_AMBULATORY_CARE_PROVIDER_SITE_OTHER): Payer: Self-pay

## 2018-09-08 ENCOUNTER — Ambulatory Visit (INDEPENDENT_AMBULATORY_CARE_PROVIDER_SITE_OTHER): Payer: Medicare HMO | Admitting: Physical Medicine and Rehabilitation

## 2018-09-08 ENCOUNTER — Encounter (INDEPENDENT_AMBULATORY_CARE_PROVIDER_SITE_OTHER): Payer: Self-pay | Admitting: Physical Medicine and Rehabilitation

## 2018-09-08 VITALS — BP 134/78 | HR 58 | Temp 98.2°F

## 2018-09-08 DIAGNOSIS — M5416 Radiculopathy, lumbar region: Secondary | ICD-10-CM

## 2018-09-08 DIAGNOSIS — D696 Thrombocytopenia, unspecified: Secondary | ICD-10-CM | POA: Diagnosis not present

## 2018-09-08 MED ORDER — BETAMETHASONE SOD PHOS & ACET 6 (3-3) MG/ML IJ SUSP
12.0000 mg | Freq: Once | INTRAMUSCULAR | Status: AC
  Administered 2018-09-08: 12 mg

## 2018-09-08 NOTE — Patient Instructions (Signed)

## 2018-09-08 NOTE — Progress Notes (Signed)
 .  Numeric Pain Rating Scale and Functional Assessment Average Pain 6   In the last MONTH (on 0-10 scale) has pain interfered with the following?  1. General activity like being  able to carry out your everyday physical activities such as walking, climbing stairs, carrying groceries, or moving a chair?  Rating(3)   +Driver, -BT, -Dye Allergies.  

## 2018-09-15 NOTE — Procedures (Signed)
Lumbosacral Transforaminal Epidural Steroid Injection - Sub-Pedicular Approach with Fluoroscopic Guidance  Patient: Jacob Farmer      Date of Birth: Oct 25, 1946 MRN: [id number] PCP: Redmond School, MD      Visit Date: 09/08/2018   Universal Protocol:    Date/Time: 09/08/2018  Consent Given By: the patient  Position: PRONE  Additional Comments: Vital signs were monitored before and after the procedure. Patient was prepped and draped in the usual sterile fashion. The correct patient, procedure, and site was verified.   Injection Procedure Details:  Procedure Site One Meds Administered:  Meds ordered this encounter  Medications  . betamethasone acetate-betamethasone sodium phosphate (CELESTONE) injection 12 mg    Laterality: Left  Location/Site:  L4-L5  Needle size: 22 G  Needle type: Spinal  Needle Placement: Transforaminal  Findings:    -Comments: Excellent flow of contrast along the nerve and into the epidural space.  Procedure Details: After squaring off the end-plates to get a true AP view, the C-arm was positioned so that an oblique view of the foramen as noted above was visualized. The target area is just inferior to the "nose of the scotty dog" or sub pedicular. The soft tissues overlying this structure were infiltrated with 2-3 ml. of 1% Lidocaine without Epinephrine.  The spinal needle was inserted toward the target using a "trajectory" view along the fluoroscope beam.  Under AP and lateral visualization, the needle was advanced so it did not puncture dura and was located close the 6 O'Clock position of the pedical in AP tracterory. Biplanar projections were used to confirm position. Aspiration was confirmed to be negative for CSF and/or blood. A 1-2 ml. volume of Isovue-250 was injected and flow of contrast was noted at each level. Radiographs were obtained for documentation purposes.   After attaining the desired flow of contrast documented above, a 0.5 to  1.0 ml test dose of 0.25% Marcaine was injected into each respective transforaminal space.  The patient was observed for 90 seconds post injection.  After no sensory deficits were reported, and normal lower extremity motor function was noted,   the above injectate was administered so that equal amounts of the injectate were placed at each foramen (level) into the transforaminal epidural space.   Additional Comments:  The patient tolerated the procedure well Dressing: Band-Aid    Post-procedure details: Patient was observed during the procedure. Post-procedure instructions were reviewed.  Patient left the clinic in stable condition.

## 2018-09-15 NOTE — Progress Notes (Signed)
Office Visit Note  Patient: Jacob Farmer             Date of Birth: August 29, 1946           MRN: [id number]             PCP: Redmond School, MD Referring: Redmond School, MD Visit Date: 09/29/2018 Occupation: @GUAROCC @  Subjective:  Medication monitoring   History of Present Illness: Jacob Farmer is a 72 y.o. male with history of seronegative rheumatoid arthritis, gout, osteoarthritis.  Patient is on Arava 10 mg by mouth daily.  He denies any recent rheumatoid arthritis flares.  He denies any joint pain or joint swelling at this time.  He denies any morning stiffness.  He reports that he has been taking allopurinol 100 mg half tablet daily.  He denies any recent gout flares.  He states he has colchicine 0.6 mg tablets at home which she takes as needed if he experiences signs of a flare.  He states he has not had to take any colchicine since his last visit.  He needs refills of allopurinol and Arava today.  He reports that he has had his yearly influenza vaccine.   Activities of Daily Living:  Patient reports morning stiffness for 0  minutes.   Patient Denies nocturnal pain.  Difficulty dressing/grooming: Denies Difficulty climbing stairs: Denies Difficulty getting out of chair: Denies Difficulty using hands for taps, buttons, cutlery, and/or writing: Denies  Review of Systems  Constitutional: Positive for fatigue. Negative for night sweats.  HENT: Negative for mouth sores, trouble swallowing, trouble swallowing, mouth dryness and nose dryness.   Eyes: Negative for redness, visual disturbance and dryness.  Respiratory: Negative for cough, hemoptysis, shortness of breath and difficulty breathing.   Cardiovascular: Negative for chest pain, palpitations, hypertension, irregular heartbeat and swelling in legs/feet.  Gastrointestinal: Negative for blood in stool, constipation and diarrhea.  Endocrine: Negative for increased urination.  Genitourinary: Positive for painful urination.    Musculoskeletal: Negative for arthralgias, joint pain, joint swelling, myalgias, muscle weakness, morning stiffness, muscle tenderness and myalgias.  Skin: Negative for color change, rash, hair loss, nodules/bumps, skin tightness, ulcers and sensitivity to sunlight.  Allergic/Immunologic: Negative for susceptible to infections.  Neurological: Negative for dizziness, fainting, memory loss, night sweats and weakness.  Hematological: Negative for swollen glands.  Psychiatric/Behavioral: Negative for depressed mood and sleep disturbance. The patient is not nervous/anxious.     PMFS History:  Patient Active Problem List   Diagnosis Date Noted  . Herniated cervical disc 05/26/2017    Class: Acute  . Herniation of cervical intervertebral disc with radiculopathy 05/26/2017  . Cervical spinal stenosis 05/26/2017  . Lumbar disc disease 02/04/2017  . Primary osteoarthritis of both hands 02/04/2017  . History of gout 02/04/2017  . DDD (degenerative disc disease), lumbar 02/04/2017  . Pain in joint of left knee 02/04/2017  . High risk medication use 01/28/2017  . History of prostate cancer 01/28/2017  . History of renal insufficiency syndrome 01/28/2017  . Malignant neoplasm of prostate ([place]) 11/17/2015  . Chronic kidney disease, stage 3, mod decreased GFR (HCC) 12/27/2011  . Diabetes mellitus, type II ([place]) 12/27/2011  . Rheumatoid arthritis ([place]) 01/04/2011  . SINUS BRADYCARDIA 01/12/2010  . 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 HYPERLIPIDEMIA 01/08/2010  . Hypertension 01/08/2010    Past Medical History:  Diagnosis Date  . Arteriosclerotic cardiovascular disease (ASCVD) cardiologist-  dr Roderic [facility]   Acute myocardial infarction treated with TPA in 1990; 1999-stents to  circumflex and RCA; residual total occlusion of the left anterior descending; normal ejection fraction; stress nuclear in 2001-distal anteroseptal ischemia; normal LV function  . Chronic kidney  disease, stage 3, mod decreased GFR ([place]) nephrologist-  at [facility] in [place]   Creatinine-2.18 and 12/2007, 1.76 and 12/2008  . First degree heart block   . History of gastric ulcer   . History of kidney stones    05/23/17- small = no pain- watching  . History of MI (myocardial infarction)    1990-  treated w/ TPA  . Hyperlipidemia   . Hypertension   . Myocardial infarction ([place]) 1990  . OA (osteoarthritis)   . Pre-diabetes   . Prediabetes   . Prostate cancer [facility]) urologist-- dr dahlstedt/  oncologist- dr Tammi Klippel   Stage T1c , Gleason 3+4,  PSA 4.7,  vol 24cc--  scheduled for radiative seed implants  . Psoriatic arthritis ([place])   . RA (rheumatoid arthritis) ([place])    rheumologist-  dr Toni Amend  . S/P drug eluting coronary stent placement    1999--  DES x2 to CFX and RCA  . Sinus bradycardia   . Wears dentures    upper and lower partial    Family History  Problem Relation Age of Onset  . Heart attack Mother   . Coronary artery disease Father    Past Surgical History:  Procedure Laterality Date  . CARDIOVASCULAR STRESS TEST  2001  per Dr Lattie Haw clinic note   distal anteroseptal ischemia,  normal LVF  . COLONOSCOPY  last one 2006 (approx)  . CORONARY ANGIOPLASTY WITH STENT PLACEMENT  1999   DES x2  to CFX and RCA/  residual total occlusion LAD,  normal LVEF  . EYE SURGERY Bilateral    cataract  . POSTERIOR CERVICAL FUSION/FORAMINOTOMY N/A 05/26/2017   Procedure: RIGHT C4-5 FORAMINOTOMY WITH EXCISION OF HERNIATED [place];  Surgeon: Jessy Oto, MD;  Location: [facility];  Service: Orthopedics;  Laterality: N/A;  . RADIOACTIVE SEED IMPLANT N/A 01/11/2016   Procedure: RADIOACTIVE SEED IMPLANT/BRACHYTHERAPY IMPLANT;  Surgeon: Franchot Gallo, MD;  Location: [facility];  Service: Urology;  Laterality: N/A;   71  seeds implanted no seeds founds in bladder   Social History   Social History Narrative   No regular exercise    Objective: Vital Signs: BP (!)  142/78 (BP Location: Left Arm, Patient Position: Sitting, Cuff Size: Normal)   Pulse (!) 56   Resp 15   Ht 5\' 10"  (1.778 m)   Wt 197 lb 3.2 oz (89.4 kg)   BMI 28.30 kg/m    Physical Exam  Constitutional: He is oriented to person, place, and time. He appears well-developed and well-nourished.  HENT:  Head: Normocephalic and atraumatic.  Eyes: Pupils are equal, round, and reactive to light. Conjunctivae and EOM are normal.  Neck: Normal range of motion. Neck supple.  Cardiovascular: Normal rate, regular rhythm and normal heart sounds.  Pulmonary/Chest: Effort normal and breath sounds normal.  Abdominal: Soft. Bowel sounds are normal.  Lymphadenopathy:    He has no cervical adenopathy.  Neurological: He is alert and oriented to person, place, and time.  Skin: Skin is warm and dry. Capillary refill takes less than 2 seconds.  Psychiatric: He has a normal mood and affect. His behavior is normal.  Nursing note and vitals reviewed.    Musculoskeletal Exam: C-spine good range of motion with no discomfort.  Slightly limited range of motion of lumbar spine.  No midline spinal tenderness.  No SI joint tenderness.  Shoulder joints, elbow joints, wrist joints, MCPs, PIPs, DIPs good range of motion no synovitis.  He has PIP and DIP synovial thickening consistent with osteoarthritis of bilateral hands.  He has complete fist formation bilaterally.  Hip joints, knee joints, ankle joints good range of motion with no discomfort or synovitis.  No warmth or effusion of bilateral knee joints.  No tenderness or swelling of ankle joints.  No tenderness of trochanter bursa bilaterally.  CDAI Exam: CDAI Score: 0.2  Patient Global Assessment: 1 (mm); Provider Global Assessment: 1 (mm) Swollen: 0 ; Tender: 0  Joint Exam   Not documented   There is currently no information documented on the homunculus. Go to the Rheumatology activity and complete the homunculus joint exam.  Investigation: No additional  findings.  Imaging: Xr C-arm No Report  Result Date: 09/08/2018 Please see Notes tab for imaging impression.   Recent Labs: Lab Results  Component Value Date   WBC 8.4 07/21/2018   HGB 14.7 07/21/2018   PLT 174 07/21/2018   NA 141 07/21/2018   K 5.0 07/21/2018   CL 104 07/21/2018   CO2 28 07/21/2018   GLUCOSE 103 07/21/2018   BUN 19 07/21/2018   CREATININE 1.23 (H) 07/21/2018   BILITOT 0.6 07/21/2018   ALKPHOS 83 05/26/2017   AST 18 07/21/2018   ALT 27 07/21/2018   PROT 6.9 07/21/2018   ALBUMIN 3.1 (L) 05/26/2017   CALCIUM 9.7 07/21/2018   GFRAA 67 07/21/2018    Speciality Comments: No specialty comments available.  Procedures:  No procedures performed Allergies: No known allergies   Assessment / Plan:     Visit Diagnoses: Seronegative rheumatoid arthritis ([place]): He has no synovitis on exam.  He is clinically doing well on Arava 10 mg by mouth daily.  He has not had any rheumatoid arthritis flares recently.  He has no joint pain, joint swelling, morning stiffness.  He has had his yearly influenza vaccine.  A refill of [place] was sent to the pharmacy today.  He was advised to notify us if he develops increased joint pain or joint swelling.  He will follow-up in the office in 5 months  High risk medication use -  Arava 10 mg by mouth daily.  CBC and CMP were drawn on 07/21/2018.  He will have lab work performed in [place] in November and every 3 months to monitor for drug toxicity.  Standing orders are in place.  Primary osteoarthritis of both hands: He has PIP and DIP synovial thickening consistent with osteoarthritis of bilateral hands.  He has complete fist formation bilaterally.  He has no synovitis on exam.  Joint protection and muscle strengthening were discussed.  Idiopathic chronic gout of multiple sites without tophus -he has not had any recent gout flares.  He has been taking allopurinol 100 mg half tablet by mouth daily.  He has not flared since lowering his dose  from 100 mg to 50 mg daily.  A refill of allopurinol sent to the pharmacy today.  He has colchicine 0.6 mg tablets at home if he develops signs or symptoms of a flare.  Uric acid will be checked with his next lab work in November.   - Plan: Uric acid  DDD (degenerative disc disease), lumbar: Chronic pain.  He sees a spine specialist.  He has had a limited range of motion of his lumbar spine.  No midline spinal tenderness.  Other medical conditions are listed as follows:  History of  renal insufficiency syndrome  History of prostate cancer  History of gastroesophageal reflux (GERD)  History of hypercholesterolemia  History of coronary artery disease  History of hypertension   Orders: Orders Placed This Encounter  Procedures  . Uric acid   Meds ordered this encounter  Medications  . allopurinol (ZYLOPRIM) 100 MG tablet    Sig: Take 0.5 tablets (50 mg total) by mouth daily.    Dispense:  45 tablet    Refill:  0  . leflunomide (ARAVA) 10 MG tablet    Sig: Take 1 tablet (10 mg total) by mouth daily.    Dispense:  90 tablet    Refill:  0     Follow-Up Instructions: Return in about 5 months (around 02/28/2019) for Rheumatoid arthritis, Osteoarthritis, Gout.   Ofilia Neas, PA-C  Note - This record has been created using Dragon software.  Chart creation errors have been sought, but may not always  have been located. Such creation errors do not reflect on  the standard of medical care.

## 2018-09-15 NOTE — Progress Notes (Signed)
Jacob Farmer - 72 y.o. male MRN [id number]  Date of birth: 1946/09/21  Office Visit Note: Visit Date: 09/08/2018 PCP: Redmond School, MD Referred by: Redmond School, MD  Subjective: Chief Complaint  Patient presents with  . Lower Back - Pain   HPI: Jacob Farmer is a 72 year old gentleman with left hip and leg pain chronic and worsening with history of disc related issues and annular tear and foraminal narrowing of the lumbar spine.  Prior injections at L4 gave him good relief a few years ago.  Recently saw Dr. Louanne Skye for reevaluation and knee pain.  Dr. Louanne Skye request diagnostic and hopefully therapeutic L4 transforaminal epidural steroid injection.  Depending on patient's relief would look at L5 level.  He will return to see Dr. Louanne Skye for follow-up.   ROS Otherwise per HPI.  Assessment & Plan: Visit Diagnoses:  1. Lumbar radiculopathy     Plan: No additional findings.   Meds & Orders:  Meds ordered this encounter  Medications  . betamethasone acetate-betamethasone sodium phosphate (CELESTONE) injection 12 mg    Orders Placed This Encounter  Procedures  . XR C-ARM NO REPORT  . Epidural Steroid injection    Follow-up: Return if symptoms worsen or fail to improve.   Procedures: No procedures performed  Lumbosacral Transforaminal Epidural Steroid Injection - Sub-Pedicular Approach with Fluoroscopic Guidance  Patient: Jacob Farmer      Date of Birth: 1946/11/28 MRN: [id number] PCP: Redmond School, MD      Visit Date: 09/08/2018   Universal Protocol:    Date/Time: 09/08/2018  Consent Given By: the patient  Position: PRONE  Additional Comments: Vital signs were monitored before and after the procedure. Patient was prepped and draped in the usual sterile fashion. The correct patient, procedure, and site was verified.   Injection Procedure Details:  Procedure Site One Meds Administered:  Meds ordered this encounter  Medications  . betamethasone  acetate-betamethasone sodium phosphate (CELESTONE) injection 12 mg    Laterality: Left  Location/Site:  L4-L5  Needle size: 22 G  Needle type: Spinal  Needle Placement: Transforaminal  Findings:    -Comments: Excellent flow of contrast along the nerve and into the epidural space.  Procedure Details: After squaring off the end-plates to get a true AP view, the C-arm was positioned so that an oblique view of the foramen as noted above was visualized. The target area is just inferior to the "nose of the scotty dog" or sub pedicular. The soft tissues overlying this structure were infiltrated with 2-3 ml. of 1% Lidocaine without Epinephrine.  The spinal needle was inserted toward the target using a "trajectory" view along the fluoroscope beam.  Under AP and lateral visualization, the needle was advanced so it did not puncture dura and was located close the 6 O'Clock position of the pedical in AP tracterory. Biplanar projections were used to confirm position. Aspiration was confirmed to be negative for CSF and/or blood. A 1-2 ml. volume of Isovue-250 was injected and flow of contrast was noted at each level. Radiographs were obtained for documentation purposes.   After attaining the desired flow of contrast documented above, a 0.5 to 1.0 ml test dose of 0.25% Marcaine was injected into each respective transforaminal space.  The patient was observed for 90 seconds post injection.  After no sensory deficits were reported, and normal lower extremity motor function was noted,   the above injectate was administered so that equal amounts of the injectate were placed at each foramen (level) into  the transforaminal epidural space.   Additional Comments:  The patient tolerated the procedure well Dressing: Band-Aid    Post-procedure details: Patient was observed during the procedure. Post-procedure instructions were reviewed.  Patient left the clinic in stable condition.    Clinical  History: L-spine MRI dated 10/17/2016  L3-4: Annular bulging, small RIGHT subarticular to extra foraminal disc protrusion. Mild facet arthropathy and ligamentum flavum redundancy without canal stenosis. Mild RIGHT neural foraminal narrowing.  L4-5: Small broad-based disc bulge, LEFT subarticular annular fissure. Moderate RIGHT mild LEFT facet arthropathy and ligamentum flavum redundancy without canal stenosis. Minimal RIGHT, mild LEFT neural foraminal narrowing.  L5-S1: Moderate broad-based disc bulge, LEFT central annular fissure. Mild facet arthropathy. No canal stenosis. Moderate to severe RIGHT, severe LEFT neural foraminal narrowing.  IMPRESSION: Degenerative change of the lumbar spine.  No canal stenosis.  Neural foraminal narrowing L3-4 through L5-S1: Severe on the LEFT at L5-S1.     Objective:  VS:  HT:    WT:   BMI:     BP:134/78  HR:(!) 58bpm  TEMP:98.2 F (36.8 C)(Oral)  RESP:  Physical Exam  Ortho Exam Imaging: No results found.

## 2018-09-22 ENCOUNTER — Ambulatory Visit: Payer: Medicare HMO | Admitting: Urology

## 2018-09-22 DIAGNOSIS — N5201 Erectile dysfunction due to arterial insufficiency: Secondary | ICD-10-CM

## 2018-09-29 ENCOUNTER — Ambulatory Visit: Payer: Medicare HMO | Admitting: Physician Assistant

## 2018-09-29 ENCOUNTER — Encounter: Payer: Self-pay | Admitting: Physician Assistant

## 2018-09-29 ENCOUNTER — Ambulatory Visit: Payer: Medicare HMO | Admitting: Rheumatology

## 2018-09-29 VITALS — BP 142/78 | HR 56 | Resp 15 | Ht 70.0 in | Wt 197.2 lb

## 2018-09-29 DIAGNOSIS — Z87448 Personal history of other diseases of urinary system: Secondary | ICD-10-CM | POA: Diagnosis not present

## 2018-09-29 DIAGNOSIS — M5136 Other intervertebral disc degeneration, lumbar region: Secondary | ICD-10-CM

## 2018-09-29 DIAGNOSIS — Z8719 Personal history of other diseases of the digestive system: Secondary | ICD-10-CM | POA: Diagnosis not present

## 2018-09-29 DIAGNOSIS — M19041 Primary osteoarthritis, right hand: Secondary | ICD-10-CM | POA: Diagnosis not present

## 2018-09-29 DIAGNOSIS — M0600 Rheumatoid arthritis without rheumatoid factor, unspecified site: Secondary | ICD-10-CM | POA: Diagnosis not present

## 2018-09-29 DIAGNOSIS — M1A09X0 Idiopathic chronic gout, multiple sites, without tophus (tophi): Secondary | ICD-10-CM

## 2018-09-29 DIAGNOSIS — Z79899 Other long term (current) drug therapy: Secondary | ICD-10-CM

## 2018-09-29 DIAGNOSIS — Z8679 Personal history of other diseases of the circulatory system: Secondary | ICD-10-CM

## 2018-09-29 DIAGNOSIS — M51369 Other intervertebral disc degeneration, lumbar region without mention of lumbar back pain or lower extremity pain: Secondary | ICD-10-CM

## 2018-09-29 DIAGNOSIS — Z8639 Personal history of other endocrine, nutritional and metabolic disease: Secondary | ICD-10-CM | POA: Diagnosis not present

## 2018-09-29 DIAGNOSIS — M19042 Primary osteoarthritis, left hand: Secondary | ICD-10-CM

## 2018-09-29 DIAGNOSIS — Z8546 Personal history of malignant neoplasm of prostate: Secondary | ICD-10-CM | POA: Diagnosis not present

## 2018-09-29 MED ORDER — ALLOPURINOL 100 MG PO TABS: 50.0000 mg | ORAL_TABLET | Freq: Every day | ORAL | 0 refills | Status: DC

## 2018-09-29 MED ORDER — LEFLUNOMIDE 10 MG PO TABS: 10.0000 mg | ORAL_TABLET | Freq: Every day | ORAL | 0 refills | Status: DC

## 2018-09-29 NOTE — Patient Instructions (Addendum)
Standing Labs We placed an order today for your standing lab work.    Please come back and get your standing labs in November and every 3 months   Uric acid, CBC, and CMP   We have open lab Monday through Friday from 8:30-11:30 AM and 1:30-4:00 PM  at the office of Dr. Bo Merino.   You may experience shorter wait times on Monday and Friday afternoons. The office is located at [address] [place], [place], [place] [address] No appointment is necessary.   Labs are drawn by [facility].  You may receive a bill from [facility] for your lab work. If you have any questions regarding directions or hours of operation,  please call [phone].   Just as a reminder please drink plenty of water prior to coming for your lab work. Thanks!

## 2018-10-22 DIAGNOSIS — L308 Other specified dermatitis: Secondary | ICD-10-CM | POA: Diagnosis not present

## 2018-10-26 DIAGNOSIS — L258 Unspecified contact dermatitis due to other agents: Secondary | ICD-10-CM | POA: Diagnosis not present

## 2018-10-27 ENCOUNTER — Telehealth: Payer: Self-pay | Admitting: Rheumatology

## 2018-10-27 DIAGNOSIS — M1A09X0 Idiopathic chronic gout, multiple sites, without tophus (tophi): Secondary | ICD-10-CM | POA: Diagnosis not present

## 2018-10-27 DIAGNOSIS — Z79899 Other long term (current) drug therapy: Secondary | ICD-10-CM | POA: Diagnosis not present

## 2018-10-27 LAB — CBC WITH DIFFERENTIAL/PLATELET
Basophils Absolute: 40 cells/uL (ref 0–200)
Basophils Relative: 0.6 %
Eosinophils Absolute: 469 cells/uL (ref 15–500)
Eosinophils Relative: 7.1 %
HCT: 41.5 % (ref 38.5–50.0)
Hemoglobin: 13.8 g/dL (ref 13.2–17.1)
Lymphs Abs: 1148 cells/uL (ref 850–3900)
MCH: 29.1 pg (ref 27.0–33.0)
MCHC: 33.3 g/dL (ref 32.0–36.0)
MCV: 87.4 fL (ref 80.0–100.0)
MPV: 12 fL (ref 7.5–12.5)
Monocytes Relative: 12.3 %
Neutro Abs: 4132 cells/uL (ref 1500–7800)
Neutrophils Relative %: 62.6 %
Platelets: 205 10*3/uL (ref 140–400)
RBC: 4.75 10*6/uL (ref 4.20–5.80)
RDW: 12.9 % (ref 11.0–15.0)
Total Lymphocyte: 17.4 %
WBC mixed population: 812 cells/uL (ref 200–950)
WBC: 6.6 10*3/uL (ref 3.8–10.8)

## 2018-10-27 LAB — COMPLETE METABOLIC PANEL WITH GFR
AG Ratio: 1.4 (calc) (ref 1.0–2.5)
ALT: 21 U/L (ref 9–46)
AST: 16 U/L (ref 10–35)
Albumin: 3.8 g/dL (ref 3.6–5.1)
Alkaline phosphatase (APISO): 124 U/L — ABNORMAL HIGH (ref 40–115)
BUN: 13 mg/dL (ref 7–25)
CO2: 29 mmol/L (ref 20–32)
Calcium: 9.6 mg/dL (ref 8.6–10.3)
Chloride: 104 mmol/L (ref 98–110)
Creat: 1.16 mg/dL (ref 0.70–1.18)
GFR, Est African American: 71 mL/min/{1.73_m2} (ref >=60)
GFR, Est Non African American: 62 mL/min/{1.73_m2} (ref >=60)
Globulin: 2.8 g/dL (calc) (ref 1.9–3.7)
Glucose, Bld: 186 mg/dL — ABNORMAL HIGH (ref 65–139)
Potassium: 4.8 mmol/L (ref 3.5–5.3)
Sodium: 140 mmol/L (ref 135–146)
Total Bilirubin: 0.5 mg/dL (ref 0.2–1.2)
Total Protein: 6.6 g/dL (ref 6.1–8.1)

## 2018-10-27 LAB — URIC ACID: Uric Acid, Serum: 5.5 mg/dL (ref 4.0–8.0)

## 2018-10-27 NOTE — Telephone Encounter (Signed)
Lab orders released.  

## 2018-10-27 NOTE — Telephone Encounter (Signed)
Patient called requesting his labwork orders be sent to [facility] in [place].  Patient states he will be going tomorrow morning.

## 2018-10-28 NOTE — Telephone Encounter (Signed)
Glucose is elevated.  Rest of the labs are stable.  We will continue to monitor.

## 2018-11-04 DIAGNOSIS — E875 Hyperkalemia: Secondary | ICD-10-CM | POA: Diagnosis not present

## 2018-11-04 DIAGNOSIS — N183 Chronic kidney disease, stage 3 (moderate): Secondary | ICD-10-CM | POA: Diagnosis not present

## 2018-11-04 DIAGNOSIS — E559 Vitamin D deficiency, unspecified: Secondary | ICD-10-CM | POA: Diagnosis not present

## 2018-11-04 DIAGNOSIS — D649 Anemia, unspecified: Secondary | ICD-10-CM | POA: Diagnosis not present

## 2018-11-04 DIAGNOSIS — Z79899 Other long term (current) drug therapy: Secondary | ICD-10-CM | POA: Diagnosis not present

## 2018-11-04 DIAGNOSIS — I10 Essential (primary) hypertension: Secondary | ICD-10-CM | POA: Diagnosis not present

## 2018-11-04 DIAGNOSIS — R809 Proteinuria, unspecified: Secondary | ICD-10-CM | POA: Diagnosis not present

## 2018-11-05 DIAGNOSIS — N183 Chronic kidney disease, stage 3 (moderate): Secondary | ICD-10-CM | POA: Diagnosis not present

## 2018-11-05 DIAGNOSIS — N2581 Secondary hyperparathyroidism of renal origin: Secondary | ICD-10-CM | POA: Diagnosis not present

## 2018-11-05 DIAGNOSIS — R809 Proteinuria, unspecified: Secondary | ICD-10-CM | POA: Diagnosis not present

## 2018-11-05 DIAGNOSIS — E559 Vitamin D deficiency, unspecified: Secondary | ICD-10-CM | POA: Diagnosis not present

## 2018-12-21 DIAGNOSIS — K219 Gastro-esophageal reflux disease without esophagitis: Secondary | ICD-10-CM | POA: Diagnosis not present

## 2018-12-21 DIAGNOSIS — E1142 Type 2 diabetes mellitus with diabetic polyneuropathy: Secondary | ICD-10-CM | POA: Diagnosis not present

## 2018-12-21 DIAGNOSIS — Z1389 Encounter for screening for other disorder: Secondary | ICD-10-CM | POA: Diagnosis not present

## 2018-12-21 DIAGNOSIS — M1991 Primary osteoarthritis, unspecified site: Secondary | ICD-10-CM | POA: Diagnosis not present

## 2018-12-21 DIAGNOSIS — E663 Overweight: Secondary | ICD-10-CM | POA: Diagnosis not present

## 2018-12-21 DIAGNOSIS — B351 Tinea unguium: Secondary | ICD-10-CM | POA: Diagnosis not present

## 2018-12-21 DIAGNOSIS — I10 Essential (primary) hypertension: Secondary | ICD-10-CM | POA: Diagnosis not present

## 2018-12-21 DIAGNOSIS — Z6829 Body mass index (BMI) 29.0-29.9, adult: Secondary | ICD-10-CM | POA: Diagnosis not present

## 2018-12-30 ENCOUNTER — Encounter (INDEPENDENT_AMBULATORY_CARE_PROVIDER_SITE_OTHER): Payer: Self-pay | Admitting: Specialist

## 2018-12-30 ENCOUNTER — Ambulatory Visit (INDEPENDENT_AMBULATORY_CARE_PROVIDER_SITE_OTHER): Payer: Medicare HMO | Admitting: Specialist

## 2018-12-30 VITALS — BP 123/65 | HR 56 | Ht 70.0 in | Wt 198.0 lb

## 2018-12-30 DIAGNOSIS — M47812 Spondylosis without myelopathy or radiculopathy, cervical region: Secondary | ICD-10-CM | POA: Diagnosis not present

## 2018-12-30 DIAGNOSIS — M48062 Spinal stenosis, lumbar region with neurogenic claudication: Secondary | ICD-10-CM | POA: Diagnosis not present

## 2018-12-30 MED ORDER — GABAPENTIN 300 MG PO CAPS: 300.0000 mg | ORAL_CAPSULE | Freq: Every day | ORAL | 4 refills | Status: DC

## 2018-12-30 NOTE — Patient Instructions (Addendum)
Plan: Avoid overhead lifting and overhead use of the arms. Do not lift greater than 5-10 lbs. Avoid bending, stooping and avoid lifting weights greater than 10 lbs. Avoid prolong standing and walking. Avoid frequent bending and stooping  No lifting greater than 10 lbs. May use ice or moist heat for pain. Weight loss is of benefit. Handicap license is approved.  Adjust head rest in vehicle to prevent hyperextension if rear ended. Take extra precautions to avoid falling, including use of a cane if you feel weak.

## 2018-12-30 NOTE — Progress Notes (Signed)
Office Visit Note   Patient: Jacob Farmer           Date of Birth: 01/15/1946           MRN: [id number] Visit Date: 12/30/2018              Requested by: Redmond [address], [place] [place], [place] [address] PCP: Redmond School, MD   Assessment & Plan: Visit Diagnoses:  1. Spondylosis without myelopathy or radiculopathy, cervical region   2. Spinal stenosis of lumbar region with neurogenic claudication     Plan: Avoid bending, stooping and avoid lifting weights greater than 10 lbs. Avoid prolong standing and walking. Avoid frequent bending and stooping  No lifting greater than 10 lbs. May use ice or moist heat for pain. Weight loss is of benefit. Handicap license is approved. Follow-Up Instructions: Return if symptoms worsen or fail to improve.   Orders:  No orders of the defined types were placed in this encounter.  No orders of the defined types were placed in this encounter.     Procedures: No procedures performed   Clinical Data: No additional findings.   Subjective: Chief Complaint  Patient presents with  . Neck - Follow-up  . Lower Back - Follow-up    73 year old male right handed with history of lumbar spondylosis and spinal stenosis. He is post cervical decommpression for foramenal stenosis with excellent relief of pain. He had a ESI for the lumbar spine with good relief.    Review of Systems  Constitutional: Negative.   HENT: Negative.   Eyes: Negative.   Respiratory: Negative.   Cardiovascular: Negative.   Gastrointestinal: Negative.   Endocrine: Negative.   Genitourinary: Negative.   Musculoskeletal: Negative.   Skin: Negative.   Allergic/Immunologic: Negative.   Neurological: Negative.   Hematological: Negative.   Psychiatric/Behavioral: Negative.      Objective: Vital Signs: BP 123/65 (BP Location: Left Arm, Patient Position: Sitting)   Pulse (!) 56   Ht 5\' 10"  (1.778 m)   Wt 198 lb (89.8 kg)   BMI 28.41 kg/m    Physical Exam Constitutional:      Appearance: He is well-developed.  HENT:     Head: Normocephalic and atraumatic.  Eyes:     Pupils: Pupils are equal, round, and reactive to light.  Neck:     Musculoskeletal: Normal range of motion and neck supple.  Pulmonary:     Effort: Pulmonary effort is normal.     Breath sounds: Normal breath sounds.  Abdominal:     General: Bowel sounds are normal.     Palpations: Abdomen is soft.  Musculoskeletal: Normal range of motion.  Skin:    General: Skin is warm and dry.  Neurological:     Mental Status: He is alert and oriented to person, place, and time.  Psychiatric:        Behavior: Behavior normal.        Thought Content: Thought content normal.        Judgment: Judgment normal.     Back Exam   Tenderness  The patient is experiencing tenderness in the cervical and lumbar.  Range of Motion  Extension: normal  Flexion: normal  Lateral bend right: normal  Lateral bend left: normal  Rotation right: normal  Rotation left: normal   Muscle Strength  Right Quadriceps:  5/5  Left Quadriceps:  5/5  Right Hamstrings:  5/5  Left Hamstrings:  5/5   Tests  Straight leg raise  right: negative Straight leg raise left: negative  Reflexes  Patellar: 0/4      Specialty Comments:  No specialty comments available.  Imaging: No results found.   PMFS History: Patient Active Problem List   Diagnosis Date Noted  . Herniated cervical disc 05/26/2017    Priority: High    Class: Acute  . Herniation of cervical intervertebral disc with radiculopathy 05/26/2017  . Cervical spinal stenosis 05/26/2017  . Lumbar disc disease 02/04/2017  . Primary osteoarthritis of both hands 02/04/2017  . History of gout 02/04/2017  . DDD (degenerative disc disease), lumbar 02/04/2017  . Pain in joint of left knee 02/04/2017  . High risk medication use 01/28/2017  . History of prostate cancer 01/28/2017  . History of renal insufficiency syndrome  01/28/2017  . Malignant neoplasm of prostate ([place]) 11/17/2015  . Chronic kidney disease, stage 3, mod decreased GFR (HCC) 12/27/2011  . Diabetes mellitus, type II ([place]) 12/27/2011  . Rheumatoid arthritis ([place]) 01/04/2011  . SINUS BRADYCARDIA 01/12/2010  . 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 HYPERLIPIDEMIA 01/08/2010  . Hypertension 01/08/2010   Past Medical History:  Diagnosis Date  . Arteriosclerotic cardiovascular disease (ASCVD) cardiologist-  dr Roderic Palau branch   Acute myocardial infarction treated with TPA in 1990; 1999-stents to circumflex and RCA; residual total occlusion of the left anterior descending; normal ejection fraction; stress nuclear in 2001-distal anteroseptal ischemia; normal LV function  . Chronic kidney disease, stage 3, mod decreased GFR ([place]) nephrologist-  at [facility] in [place]   Creatinine-2.18 and 12/2007, 1.76 and 12/2008  . First degree heart block   . History of gastric ulcer   . History of kidney stones    05/23/17- small = no pain- watching  . History of MI (myocardial infarction)    1990-  treated w/ TPA  . Hyperlipidemia   . Hypertension   . Myocardial infarction ([place]) 1990  . OA (osteoarthritis)   . Pre-diabetes   . Prediabetes   . Prostate cancer [facility]) urologist-- dr dahlstedt/  oncologist- dr Tammi Klippel   Stage T1c , Gleason 3+4,  PSA 4.7,  vol 24cc--  scheduled for radiative seed implants  . Psoriatic arthritis ([place])   . RA (rheumatoid arthritis) ([place])    rheumologist-  dr Toni Amend  . S/P drug eluting coronary stent placement    1999--  DES x2 to CFX and RCA  . Sinus bradycardia   . Wears dentures    upper and lower partial    Family History  Problem Relation Age of Onset  . Heart attack Mother   . Coronary artery disease Father     Past Surgical History:  Procedure Laterality Date  . CARDIOVASCULAR STRESS TEST  2001  per Dr Lattie Haw clinic note   distal anteroseptal ischemia,  normal LVF  . COLONOSCOPY   last one 2006 (approx)  . CORONARY ANGIOPLASTY WITH STENT PLACEMENT  1999   DES x2  to CFX and RCA/  residual total occlusion LAD,  normal LVEF  . EYE SURGERY Bilateral    cataract  . POSTERIOR CERVICAL FUSION/FORAMINOTOMY N/A 05/26/2017   Procedure: RIGHT C4-5 FORAMINOTOMY WITH EXCISION OF HERNIATED [place];  Surgeon: Jessy Oto, MD;  Location: [facility];  Service: Orthopedics;  Laterality: N/A;  . RADIOACTIVE SEED IMPLANT N/A 01/11/2016   Procedure: RADIOACTIVE SEED IMPLANT/BRACHYTHERAPY IMPLANT;  Surgeon: Franchot Gallo, MD;  Location: [facility];  Service: Urology;  Laterality: N/A;   71  seeds implanted no seeds founds in bladder  Social History   Occupational History  . Occupation: Retired Clinical biochemist  Tobacco Use  . Smoking status: Former Smoker    Packs/day: 1.00    Years: 30.00    Pack years: 30.00    Types: Cigarettes    Last attempt to quit: 05/21/1989    Years since quitting: 29.6  . Smokeless tobacco: Never Used  Substance and Sexual Activity  . Alcohol use: Yes    Alcohol/week: 28.0 standard drinks    Types: 14 Shots of liquor, 14 Standard drinks or equivalent per week    Comment: 2-3 oz per day  . Drug use: No  . Sexual activity: Not on file

## 2018-12-31 ENCOUNTER — Other Ambulatory Visit: Payer: Self-pay | Admitting: Physician Assistant

## 2018-12-31 ENCOUNTER — Other Ambulatory Visit (INDEPENDENT_AMBULATORY_CARE_PROVIDER_SITE_OTHER): Payer: Self-pay | Admitting: Specialist

## 2019-01-01 NOTE — Telephone Encounter (Signed)
Last Visit: 09/29/18 Next Visit: 03/03/19 Labs: 10/27/18 Glucose is elevated. Rest of the labs are stable.

## 2019-01-01 NOTE — Telephone Encounter (Signed)
Baclofen refill request 

## 2019-01-01 NOTE — Telephone Encounter (Signed)
Ok to refill 

## 2019-01-20 ENCOUNTER — Telehealth: Payer: Self-pay | Admitting: Rheumatology

## 2019-01-20 ENCOUNTER — Other Ambulatory Visit: Payer: Self-pay | Admitting: *Deleted

## 2019-01-20 DIAGNOSIS — Z79899 Other long term (current) drug therapy: Secondary | ICD-10-CM

## 2019-01-20 NOTE — Telephone Encounter (Signed)
Lab orders released.  

## 2019-01-20 NOTE — Telephone Encounter (Signed)
Patient called requesting labwork orders be sent to [facility] in [place].  Patient states he will be going tomorrow 01/21/19.

## 2019-01-26 DIAGNOSIS — Z79899 Other long term (current) drug therapy: Secondary | ICD-10-CM | POA: Diagnosis not present

## 2019-01-27 LAB — COMPLETE METABOLIC PANEL WITH GFR
AG Ratio: 1.3 (calc) (ref 1.0–2.5)
ALT: 23 U/L (ref 9–46)
AST: 19 U/L (ref 10–35)
Albumin: 3.9 g/dL (ref 3.6–5.1)
Alkaline phosphatase (APISO): 129 U/L (ref 35–144)
BUN/Creatinine Ratio: 11 (calc) (ref 6–22)
BUN: 14 mg/dL (ref 7–25)
CO2: 27 mmol/L (ref 20–32)
Calcium: 9.5 mg/dL (ref 8.6–10.3)
Chloride: 104 mmol/L (ref 98–110)
Creat: 1.32 mg/dL — ABNORMAL HIGH (ref 0.70–1.18)
GFR, Est African American: 61 mL/min/{1.73_m2} (ref >=60)
GFR, Est Non African American: 53 mL/min/{1.73_m2} — ABNORMAL LOW (ref >=60)
Globulin: 3 g/dL (calc) (ref 1.9–3.7)
Glucose, Bld: 76 mg/dL (ref 65–139)
Potassium: 4.8 mmol/L (ref 3.5–5.3)
Sodium: 141 mmol/L (ref 135–146)
Total Bilirubin: 0.5 mg/dL (ref 0.2–1.2)
Total Protein: 6.9 g/dL (ref 6.1–8.1)

## 2019-01-27 LAB — CBC WITH DIFFERENTIAL/PLATELET
Absolute Monocytes: 812 cells/uL (ref 200–950)
Basophils Absolute: 33 cells/uL (ref 0–200)
Basophils Relative: 0.5 %
Eosinophils Absolute: 422 cells/uL (ref 15–500)
Eosinophils Relative: 6.4 %
HCT: 45.2 % (ref 38.5–50.0)
Hemoglobin: 15 g/dL (ref 13.2–17.1)
Lymphs Abs: 1327 cells/uL (ref 850–3900)
MCH: 28.3 pg (ref 27.0–33.0)
MCHC: 33.2 g/dL (ref 32.0–36.0)
MCV: 85.3 fL (ref 80.0–100.0)
MPV: 11.4 fL (ref 7.5–12.5)
Monocytes Relative: 12.3 %
Neutro Abs: 4006 cells/uL (ref 1500–7800)
Neutrophils Relative %: 60.7 %
Platelets: 218 10*3/uL (ref 140–400)
RBC: 5.3 10*6/uL (ref 4.20–5.80)
RDW: 13 % (ref 11.0–15.0)
Total Lymphocyte: 20.1 %
WBC: 6.6 10*3/uL (ref 3.8–10.8)

## 2019-01-27 NOTE — Progress Notes (Signed)
Creatinine is elevated.  Please encourage increased fluid intake.  Repeat BMP in 1 month.

## 2019-01-28 ENCOUNTER — Telehealth: Payer: Self-pay | Admitting: *Deleted

## 2019-01-28 DIAGNOSIS — Z79899 Other long term (current) drug therapy: Secondary | ICD-10-CM

## 2019-01-28 NOTE — Telephone Encounter (Signed)
-----   Message from Bo Merino, MD sent at 01/27/2019 12:26 PM EST ----- Creatinine is elevated.  Please encourage increased fluid intake.  Repeat BMP in 1 month.

## 2019-02-17 NOTE — Progress Notes (Signed)
Office Visit Note  Patient: Jacob Farmer             Date of Birth: [birth year]           MRN: [id number]             PCP: Redmond School, MD Referring: Redmond School, MD Visit Date: 03/03/2019 Occupation: @GUAROCC @  Subjective:  Medication management.   History of Present Illness: Jacob Farmer is a 73 y.o. male with history of seronegative rheumatoid arthritis, osteoarthritis, gout, and DDD.  He is taking Arava 10 mg po daily. He is taking Allopurinol 100 mg 1/2 tablet po daily for management of gout.  He states he discontinued gabapentin because this causes fluid retention.  He is doing better once he stopped the gabapentin.  He also experienced a rash while he was taking gabapentin.  He denies any joint swelling currently.  Not had a gout flare in a long time.  Activities of Daily Living:  Patient reports morning stiffness for 0 minute.   Patient Denies nocturnal pain.  Difficulty dressing/grooming: Denies Difficulty climbing stairs: Denies Difficulty getting out of chair: Denies Difficulty using hands for taps, buttons, cutlery, and/or writing: Denies  Review of Systems  Constitutional: Negative for fatigue and night sweats.  HENT: Positive for mouth dryness. Negative for mouth sores and nose dryness.   Eyes: Negative for redness, visual disturbance and dryness.  Respiratory: Negative for cough, hemoptysis, shortness of breath and difficulty breathing.   Cardiovascular: Negative for chest pain, palpitations, hypertension, irregular heartbeat and swelling in legs/feet.  Gastrointestinal: Negative for blood in stool, constipation and diarrhea.  Endocrine: Negative for increased urination.  Genitourinary: Negative for painful urination.  Musculoskeletal: Negative for arthralgias, joint pain, joint swelling, myalgias, muscle weakness, morning stiffness, muscle tenderness and myalgias.  Skin: Negative for color change, rash, hair loss, nodules/bumps, skin tightness, ulcers  and sensitivity to sunlight.  Allergic/Immunologic: Negative for susceptible to infections.  Neurological: Negative for dizziness, fainting, memory loss, night sweats and weakness.  Hematological: Negative for swollen glands.  Psychiatric/Behavioral: Negative for depressed mood and sleep disturbance. The patient is not nervous/anxious.     PMFS History:  Patient Active Problem List   Diagnosis Date Noted  . Herniated cervical disc 05/26/2017    Class: Acute  . Herniation of cervical intervertebral disc with radiculopathy 05/26/2017  . Cervical spinal stenosis 05/26/2017  . Lumbar disc disease 02/04/2017  . Primary osteoarthritis of both hands 02/04/2017  . History of gout 02/04/2017  . DDD (degenerative disc disease), lumbar 02/04/2017  . Pain in joint of left knee 02/04/2017  . High risk medication use 01/28/2017  . History of prostate cancer 01/28/2017  . History of renal insufficiency syndrome 01/28/2017  . Malignant neoplasm of prostate ([place]) 11/17/2015  . Chronic kidney disease, stage 3, mod decreased GFR (HCC) 12/27/2011  . Diabetes mellitus, type II ([place]) 12/27/2011  . Rheumatoid arthritis ([place]) 01/04/2011  . SINUS BRADYCARDIA 01/12/2010  . 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 HYPERLIPIDEMIA 01/08/2010  . Hypertension 01/08/2010    Past Medical History:  Diagnosis Date  . Arteriosclerotic cardiovascular disease (ASCVD) cardiologist-  dr Roderic Palau branch   Acute myocardial infarction treated with TPA in 1990; 1999-stents to circumflex and RCA; residual total occlusion of the left anterior descending; normal ejection fraction; stress nuclear in 2001-distal anteroseptal ischemia; normal LV function  . Chronic kidney disease, stage 3, mod decreased GFR ([place]) nephrologist-  at [facility] in [place]   Creatinine-2.18 and 12/2007, 1.76  and 12/2008  . First degree heart block   . History of gastric ulcer   . History of kidney stones    05/23/17- small  = no pain- watching  . History of MI (myocardial infarction)    1990-  treated w/ TPA  . Hyperlipidemia   . Hypertension   . Myocardial infarction ([place]) 1990  . OA (osteoarthritis)   . Pre-diabetes   . Prediabetes   . Prostate cancer [facility]) urologist-- dr dahlstedt/  oncologist- dr Tammi Klippel   Stage T1c , Gleason 3+4,  PSA 4.7,  vol 24cc--  scheduled for radiative seed implants  . Psoriatic arthritis ([place])   . RA (rheumatoid arthritis) ([place])    rheumologist-  dr Toni Amend  . S/P drug eluting coronary stent placement    1999--  DES x2 to CFX and RCA  . Sinus bradycardia   . Wears dentures    upper and lower partial    Family History  Problem Relation Age of Onset  . Heart attack Mother   . Coronary artery disease Father    Past Surgical History:  Procedure Laterality Date  . CARDIOVASCULAR STRESS TEST  2001  per Dr Lattie Haw clinic note   distal anteroseptal ischemia,  normal LVF  . COLONOSCOPY  last one 2006 (approx)  . CORONARY ANGIOPLASTY WITH STENT PLACEMENT  1999   DES x2  to CFX and RCA/  residual total occlusion LAD,  normal LVEF  . EYE SURGERY Bilateral    cataract  . POSTERIOR CERVICAL FUSION/FORAMINOTOMY N/A 05/26/2017   Procedure: RIGHT C4-5 FORAMINOTOMY WITH EXCISION OF HERNIATED [place];  Surgeon: Jessy Oto, MD;  Location: [facility];  Service: Orthopedics;  Laterality: N/A;  . RADIOACTIVE SEED IMPLANT N/A 01/11/2016   Procedure: RADIOACTIVE SEED IMPLANT/BRACHYTHERAPY IMPLANT;  Surgeon: Franchot Gallo, MD;  Location[facility];  Service: Urology;  Laterality: N/A;   71  seeds implanted no seeds founds in bladder   Social History   Social History Narrative   No regular exercise   Immunization History  Administered Date(s) Administered  . Influenza, High Dose Seasonal PF 09/12/2017     Objective: Vital Signs: BP 123/69 (BP Location: Left Arm, Patient Position: Sitting, Cuff Size: Small)   Pulse (!) 59   Resp 12   Ht 5\' 10"  (1.778 m)   Wt 199  lb 6.4 oz (90.4 kg)   BMI 28.61 kg/m    Physical Exam Vitals signs and nursing note reviewed.  Constitutional:      Appearance: He is well-developed.  HENT:     Head: Normocephalic and atraumatic.  Eyes:     Conjunctiva/sclera: Conjunctivae normal.     Pupils: Pupils are equal, round, and reactive to light.  Neck:     Musculoskeletal: Normal range of motion and neck supple.  Cardiovascular:     Rate and Rhythm: Normal rate and regular rhythm.     Heart sounds: Normal heart sounds.  Pulmonary:     Effort: Pulmonary effort is normal.     Breath sounds: Normal breath sounds.  Abdominal:     General: Bowel sounds are normal.     Palpations: Abdomen is soft.  Lymphadenopathy:     Cervical: No cervical adenopathy.  Skin:    General: Skin is warm and dry.     Capillary Refill: Capillary refill takes less than 2 seconds.  Neurological:     Mental Status: He is alert and oriented to person, place, and time.  Psychiatric:  Behavior: Behavior normal.      Musculoskeletal Exam: C-spine good range of motion.  He has limited range of motion of the lumbar spine.  Shoulder joints, elbow joints, wrist joints were in good range of motion.  He has DIP PIP and CMC thickening bilaterally but no synovitis.  Hip joints knee joints ankles MTPs with good range of motion.  CDAI Exam: CDAI Score: Not documented Patient Global Assessment: Not documented; Provider Global Assessment: Not documented Swollen: Not documented; Tender: Not documented Joint Exam   Not documented   There is currently no information documented on the homunculus. Go to the Rheumatology activity and complete the homunculus joint exam.  Investigation: No additional findings.  Imaging: No results found.  Recent Labs: Lab Results  Component Value Date   WBC 6.6 01/26/2019   HGB 15.0 01/26/2019   PLT 218 01/26/2019   NA 138 02/23/2019   K 4.8 02/23/2019   CL 103 02/23/2019   CO2 28 02/23/2019   GLUCOSE 95  02/23/2019   BUN 17 02/23/2019   CREATININE 1.34 (H) 02/23/2019   BILITOT 0.5 01/26/2019   ALKPHOS 83 05/26/2017   AST 19 01/26/2019   ALT 23 01/26/2019   PROT 6.9 01/26/2019   ALBUMIN 3.1 (L) 05/26/2017   CALCIUM 9.4 02/23/2019   GFRAA 60 02/23/2019    Speciality Comments: No specialty comments available.  Procedures:  No procedures performed Allergies: No known allergies   Assessment / Plan:     Visit Diagnoses: Seronegative rheumatoid arthritis ([place]) -patient had no synovitis on examination.  He has been doing quite well on low-dose Arava.  A prescription refill for a was given.  Plan: leflunomide (ARAVA) 10 MG tablet  High risk medication use - Arava 10 mg by mouth daily.  Labs have been stable except for elevated creatinine which is been stable.  We will continue to monitor it.  Idiopathic chronic gout of multiple sites without tophus -patient has not had a gout flare in a long time.  Plan: Uric acid with next labs.  Primary osteoarthritis of both hands-he has some osteoarthritis in his hands which causes stiffness.  DDD (degenerative disc disease), lumbar-lower back pain is tolerable currently.  History of renal insufficiency syndrome-his GFR is a stable.  Other medical problems listed as follows:  History of prostate cancer  History of gastroesophageal reflux (GERD)  History of hypercholesterolemia  History of coronary artery disease  History of hypertension   Orders: Orders Placed This Encounter  Procedures  . Uric acid   Meds ordered this encounter  Medications  . leflunomide (ARAVA) 10 MG tablet    Sig: Take 1 tablet (10 mg total) by mouth daily.    Dispense:  90 tablet    Refill:  0     Follow-Up Instructions: Return in about 5 months (around 08/03/2019) for Rheumatoid arthritis, Osteoarthritis.   Bo Merino, MD  Note - This record has been created using Editor, commissioning.  Chart creation errors have been sought, but may not always  have  been located. Such creation errors do not reflect on  the standard of medical care.

## 2019-02-19 ENCOUNTER — Telehealth: Payer: Self-pay | Admitting: Rheumatology

## 2019-02-19 DIAGNOSIS — Z79899 Other long term (current) drug therapy: Secondary | ICD-10-CM

## 2019-02-19 NOTE — Telephone Encounter (Signed)
Lab orders released.  

## 2019-02-19 NOTE — Telephone Encounter (Signed)
Patient called requesting labwork orders to be sent to [facility] in [place].  Patient states he will be going on Monday 02/22/19.

## 2019-02-23 DIAGNOSIS — Z79899 Other long term (current) drug therapy: Secondary | ICD-10-CM | POA: Diagnosis not present

## 2019-02-23 LAB — BASIC METABOLIC PANEL WITH GFR
BUN/Creatinine Ratio: 13 (calc) (ref 6–22)
BUN: 17 mg/dL (ref 7–25)
CO2: 28 mmol/L (ref 20–32)
Calcium: 9.4 mg/dL (ref 8.6–10.3)
Chloride: 103 mmol/L (ref 98–110)
Creat: 1.34 mg/dL — ABNORMAL HIGH (ref 0.70–1.18)
GFR, Est African American: 60 mL/min/{1.73_m2} (ref >=60)
GFR, Est Non African American: 52 mL/min/{1.73_m2} — ABNORMAL LOW (ref >=60)
Glucose, Bld: 95 mg/dL (ref 65–139)
Potassium: 4.8 mmol/L (ref 3.5–5.3)
Sodium: 138 mmol/L (ref 135–146)

## 2019-02-24 NOTE — Telephone Encounter (Signed)
Labs are stable.

## 2019-03-03 ENCOUNTER — Encounter: Payer: Self-pay | Admitting: Rheumatology

## 2019-03-03 ENCOUNTER — Ambulatory Visit: Payer: Medicare HMO | Admitting: Rheumatology

## 2019-03-03 ENCOUNTER — Other Ambulatory Visit: Payer: Self-pay

## 2019-03-03 VITALS — BP 123/69 | HR 59 | Resp 12 | Ht 70.0 in | Wt 199.4 lb

## 2019-03-03 DIAGNOSIS — Z8639 Personal history of other endocrine, nutritional and metabolic disease: Secondary | ICD-10-CM | POA: Diagnosis not present

## 2019-03-03 DIAGNOSIS — M5136 Other intervertebral disc degeneration, lumbar region: Secondary | ICD-10-CM | POA: Diagnosis not present

## 2019-03-03 DIAGNOSIS — Z87448 Personal history of other diseases of urinary system: Secondary | ICD-10-CM

## 2019-03-03 DIAGNOSIS — M1A09X0 Idiopathic chronic gout, multiple sites, without tophus (tophi): Secondary | ICD-10-CM | POA: Diagnosis not present

## 2019-03-03 DIAGNOSIS — M19041 Primary osteoarthritis, right hand: Secondary | ICD-10-CM | POA: Diagnosis not present

## 2019-03-03 DIAGNOSIS — Z8719 Personal history of other diseases of the digestive system: Secondary | ICD-10-CM

## 2019-03-03 DIAGNOSIS — M19042 Primary osteoarthritis, left hand: Secondary | ICD-10-CM

## 2019-03-03 DIAGNOSIS — Z8679 Personal history of other diseases of the circulatory system: Secondary | ICD-10-CM

## 2019-03-03 DIAGNOSIS — M0600 Rheumatoid arthritis without rheumatoid factor, unspecified site: Secondary | ICD-10-CM

## 2019-03-03 DIAGNOSIS — Z79899 Other long term (current) drug therapy: Secondary | ICD-10-CM

## 2019-03-03 DIAGNOSIS — Z8546 Personal history of malignant neoplasm of prostate: Secondary | ICD-10-CM | POA: Diagnosis not present

## 2019-03-03 MED ORDER — LEFLUNOMIDE 10 MG PO TABS: 10.0000 mg | ORAL_TABLET | Freq: Every day | ORAL | 0 refills | Status: DC

## 2019-03-03 NOTE — Progress Notes (Signed)
Medication Samples have been provided to the patient.  Drug name: Mitigare     Strength: 0.6mg       Qty: 3 LOT: Jace.Ards Exp.Date: 03/2020  Dosing instructions: Take 1 capsule by mouth daily as needed.   The patient has been instructed regarding the correct time, dose, and frequency of taking this medication, including desired effects and most common side effects.   Achol Azpeitia C Deberah Adolf 1:31 PM 03/03/2019

## 2019-03-03 NOTE — Patient Instructions (Signed)
Standing Labs We placed an order today for your standing lab work.   Please come back and get your standing labs in June and every 3 months.  Uric acid with your next lab.  We have open lab Monday through Friday from 8:30-11:30 AM and 1:30-4:00 PM  at the office of Dr. Bo Merino.   You may experience shorter wait times on Monday and Friday afternoons. The office is located at [address] [place], [place], [place] [address] No appointment is necessary.   Labs are drawn by [facility].  You may receive a bill from [facility] for your lab work.  If you wish to have your labs drawn at another location, please call the office 24 hours in advance to send orders.  If you have any questions regarding directions or hours of operation,  please call [phone].   Just as a reminder please drink plenty of water prior to coming for your lab work. Thanks!

## 2019-03-08 ENCOUNTER — Telehealth: Payer: Self-pay | Admitting: Rheumatology

## 2019-03-08 MED ORDER — ALLOPURINOL 100 MG PO TABS: 50.0000 mg | ORAL_TABLET | Freq: Every day | ORAL | 0 refills | Status: DC

## 2019-03-08 NOTE — Telephone Encounter (Signed)
Patients wife stating refill for Allopurinol for 90 day supply requested at last appt was not received by [facility] . Please check to make sure Leflunomide went thru also, per wife.

## 2019-03-08 NOTE — Telephone Encounter (Signed)
Last Visit: 03/03/2019 Next Visit: 08/04/2019 Labs: 01/26/2019  Okay to refill per Dr. Estanislado Pandy.

## 2019-03-23 DIAGNOSIS — Z8546 Personal history of malignant neoplasm of prostate: Secondary | ICD-10-CM | POA: Diagnosis not present

## 2019-04-14 ENCOUNTER — Telehealth: Payer: Self-pay | Admitting: Cardiology

## 2019-04-14 MED ORDER — AMLODIPINE BESYLATE 5 MG PO TABS: 5.0000 mg | ORAL_TABLET | Freq: Every day | ORAL | 3 refills | Status: DC

## 2019-04-14 NOTE — Telephone Encounter (Signed)
Spoke with pt who states that shortly after increasing Norvascto 10 mg in May 2019 he started to itch. Pt reports that he at first thought if was poison ivy. But that has since cleared up. Pt denies any new meds, foods or clothing detergent. He reports that he did see a dermatologist and no cause of the generalized  itching was found. Please advise.

## 2019-04-14 NOTE — Telephone Encounter (Signed)
Can lower dose back to 5mg  daily of norvasc and upate [place] in 2 weeks   J Frimy Uffelman MD

## 2019-04-14 NOTE — Telephone Encounter (Signed)
Pt thinks that the amLODipine (NORVASC) 10 MG tablet [[id number]] ENDED  Is causing him to itch since Dr. Harl Bowie increased the dose last year.

## 2019-04-14 NOTE — Telephone Encounter (Signed)
Returned call to pt. No answer. No voicemail. Will try later.

## 2019-04-14 NOTE — Telephone Encounter (Signed)
Pt notified and voiced understanding 

## 2019-04-26 DIAGNOSIS — N183 Chronic kidney disease, stage 3 (moderate): Secondary | ICD-10-CM | POA: Diagnosis not present

## 2019-04-26 DIAGNOSIS — Z79899 Other long term (current) drug therapy: Secondary | ICD-10-CM | POA: Diagnosis not present

## 2019-04-26 DIAGNOSIS — E559 Vitamin D deficiency, unspecified: Secondary | ICD-10-CM | POA: Diagnosis not present

## 2019-04-26 DIAGNOSIS — R809 Proteinuria, unspecified: Secondary | ICD-10-CM | POA: Diagnosis not present

## 2019-04-26 DIAGNOSIS — D509 Iron deficiency anemia, unspecified: Secondary | ICD-10-CM | POA: Diagnosis not present

## 2019-04-26 DIAGNOSIS — I10 Essential (primary) hypertension: Secondary | ICD-10-CM | POA: Diagnosis not present

## 2019-04-26 NOTE — Progress Notes (Signed)
Office Visit Note  Patient: Jacob Farmer             Date of Birth: Feb 26, 1946           MRN: [id number]             PCP: Redmond School, MD Referring: Redmond School, MD Visit Date: 04/27/2019 Occupation: @GUAROCC @  Subjective:  Rash  History of Present Illness: Jacob Farmer is a 73 y.o. male with history of seronegative rheumatoid arthritis, gout, and DDD.  He is taking Arava 10 mg po daily for management of RA.  He takes allopurinol 50 mg po daily for management of gout.  He denies any recent flares.  He denies any joint pain or joint swelling at this time.  He denies any morning stiffness.   He has noticed a rash develops on bilateral lower extremities that started 1 year ago.  He states the rash does no itch but has generalized itching at night that keeps him awake.  He has been applying a topical anti-itch cream PRN at night.  He has seen a dermatologist, and according to the patient the rash is due to history of smoking cigarettes.      Activities of Daily Living:  Patient reports morning stiffness for 0 minutes.   Patient Denies nocturnal pain.  Difficulty dressing/grooming: Denies Difficulty climbing stairs: Denies Difficulty getting out of chair: Denies Difficulty using hands for taps, buttons, cutlery, and/or writing: Denies  Review of Systems  Constitutional: Positive for fatigue. Negative for night sweats.  HENT: Negative for mouth sores, mouth dryness and nose dryness.   Eyes: Negative for redness and dryness.  Respiratory: Negative for cough, shortness of breath and difficulty breathing.   Cardiovascular: Negative for chest pain, palpitations, hypertension, irregular heartbeat and swelling in legs/feet.  Gastrointestinal: Negative for blood in stool, constipation and diarrhea.  Endocrine: Negative for increased urination.  Genitourinary: Negative for painful urination.  Musculoskeletal: Negative for arthralgias, joint pain, joint swelling, myalgias, muscle  weakness, morning stiffness, muscle tenderness and myalgias.  Skin: Positive for rash. Negative for color change, hair loss, nodules/bumps, skin tightness, ulcers and sensitivity to sunlight.  Allergic/Immunologic: Negative for susceptible to infections.  Neurological: Negative for dizziness, fainting, memory loss, night sweats and weakness.  Hematological: Negative for swollen glands.  Psychiatric/Behavioral: Positive for sleep disturbance. Negative for depressed mood. The patient is not nervous/anxious.     PMFS History:  Patient Active Problem List   Diagnosis Date Noted   Herniated cervical disc 05/26/2017    Class: Acute   Herniation of cervical intervertebral disc with radiculopathy 05/26/2017   Cervical spinal stenosis 05/26/2017   Lumbar disc disease 02/04/2017   Primary osteoarthritis of both hands 02/04/2017   History of gout 02/04/2017   DDD (degenerative disc disease), lumbar 02/04/2017   Pain in joint of left knee 02/04/2017   High risk medication use 01/28/2017   History of prostate cancer 01/28/2017   History of renal insufficiency syndrome 01/28/2017   Malignant neoplasm of prostate ([place]) 11/17/2015   Chronic kidney disease, stage 3, mod decreased GFR ([place]) 12/27/2011   Diabetes mellitus, type II ([place]) 12/27/2011   Rheumatoid arthritis ([place]) 01/04/2011   SINUS BRADYCARDIA 01/12/2010   Arteriosclerotic cardiovascular disease (ASCVD) 01/12/2010   HYPERLIPIDEMIA 01/08/2010   Hypertension 01/08/2010    Past Medical History:  Diagnosis Date   Arteriosclerotic cardiovascular disease (ASCVD) cardiologist-  dr Roderic [facility]   Acute myocardial infarction treated with TPA in 1990; 1999-stents to circumflex and RCA;  residual total occlusion of the left anterior descending; normal ejection fraction; stress nuclear in 2001-distal anteroseptal ischemia; normal LV function   Chronic kidney disease, stage 3, mod decreased GFR ([place]) nephrologist-  at [facility] in [place]   Creatinine-2.18 and 12/2007, 1.76 and 12/2008   First degree heart block    History of gastric ulcer    History of kidney stones    05/23/17- small = no pain- watching   History of MI (myocardial infarction)    1990-  treated w/ TPA   Hyperlipidemia    Hypertension    Myocardial infarction ([place]) 1990   OA (osteoarthritis)    Pre-diabetes    Prediabetes    Prostate cancer [facility]) urologist-- dr dahlstedt/  oncologist- dr Tammi Klippel   Stage T1c , Gleason 3+4,  PSA 4.7,  vol 24cc--  scheduled for radiative seed implants   Psoriatic arthritis ([place])    RA (rheumatoid arthritis) ([place])    rheumologist-  dr Toni Amend   S/P drug eluting coronary stent placement    1999--  DES x2 to CFX and RCA   Sinus bradycardia    Wears dentures    upper and lower partial    Family History  Problem Relation Age of Onset   Heart attack Mother    Coronary artery disease Father    Past Surgical History:  Procedure Laterality Date   CARDIOVASCULAR STRESS TEST  2001  per Dr Lattie Haw clinic note   distal anteroseptal ischemia,  normal LVF   COLONOSCOPY  last one 2006 (approx)   CORONARY ANGIOPLASTY WITH STENT PLACEMENT  1999   DES x2  to CFX and RCA/  residual total occlusion LAD,  normal LVEF   EYE SURGERY Bilateral    cataract   POSTERIOR CERVICAL FUSION/FORAMINOTOMY N/A 05/26/2017   Procedure: RIGHT C4-5 FORAMINOTOMY WITH EXCISION OF HERNIATED [place];  Surgeon: Jessy Oto, MD;  Location: [place];  Service: Orthopedics;  Laterality: N/A;   RADIOACTIVE SEED IMPLANT N/A 01/11/2016   Procedure: RADIOACTIVE SEED IMPLANT/BRACHYTHERAPY IMPLANT;  Surgeon: Franchot Gallo, MD;  Location[facility];  Service: Urology;  Laterality: N/A;   71  seeds implanted no seeds founds in bladder   Social History   Social History Narrative   No regular exercise   Immunization History  Administered Date(s) Administered   Influenza, High Dose Seasonal PF  09/12/2017     Objective: Vital Signs: BP (!) 155/72 (BP Location: Left Arm, Patient Position: Sitting, Cuff Size: Normal)    Pulse (!) 52    Resp 14    Ht 5\' 10"  (1.778 m)    Wt 196 lb 12.8 oz (89.3 kg)    BMI 28.24 kg/m    Physical Exam Vitals signs and nursing note reviewed.  Constitutional:      Appearance: He is well-developed.  HENT:     Head: Normocephalic and atraumatic.  Eyes:     Conjunctiva/sclera: Conjunctivae normal.     Pupils: Pupils are equal, round, and reactive to light.  Neck:     Musculoskeletal: Normal range of motion and neck supple.  Cardiovascular:     Rate and Rhythm: Normal rate and regular rhythm.     Heart sounds: Normal heart sounds.  Pulmonary:     Effort: Pulmonary effort is normal.     Breath sounds: Normal breath sounds.  Abdominal:     General: Bowel sounds are normal.     Palpations: Abdomen is soft.  Skin:    General: Skin is  warm and dry.     Capillary Refill: Capillary refill takes less than 2 seconds.     Comments: Macular lesions on bilateral lower extremities.   Neurological:     Mental Status: He is alert and oriented to person, place, and time.  Psychiatric:        Behavior: Behavior normal.      Musculoskeletal Exam: C-spine, thoracic spine, and lumbar spine good ROM.  No midline spinal tenderness.  No SI joint tenderness.  Shoulder joints, elbow joints, wrist joints, MCPs, PIPs, and DIPs good ROM with no synovitis.  DIP synovial thickening.  Hip joints, knee joints, ankle joints, MTPs, PIPs ,and DIPs good ROM with no synovitis.  No warmth or effusion of knee joints.  No tenderness or swelling of ankle joints.  No tenderness over trochanteric bursa bilaterally.    CDAI Exam: CDAI Score: Not documented Patient Global Assessment: Not documented; Provider Global Assessment: Not documented Swollen: Not documented; Tender: Not documented Joint Exam   Not documented   There is currently no information documented on the homunculus.  Go to the Rheumatology activity and complete the homunculus joint exam.  Investigation: No additional findings.  Imaging: No results found.  Recent Labs: Lab Results  Component Value Date   WBC 6.6 01/26/2019   HGB 15.0 01/26/2019   PLT 218 01/26/2019   NA 138 02/23/2019   K 4.8 02/23/2019   CL 103 02/23/2019   CO2 28 02/23/2019   GLUCOSE 95 02/23/2019   BUN 17 02/23/2019   CREATININE 1.34 (H) 02/23/2019   BILITOT 0.5 01/26/2019   ALKPHOS 83 05/26/2017   AST 19 01/26/2019   ALT 23 01/26/2019   PROT 6.9 01/26/2019   ALBUMIN 3.1 (L) 05/26/2017   CALCIUM 9.4 02/23/2019   GFRAA 60 02/23/2019    Speciality Comments: No specialty comments available.  Procedures:  No procedures performed Allergies: No known allergies   Assessment / Plan:     Visit Diagnoses: Seronegative rheumatoid arthritis ([place]): He has not had any recent rheumatoid arthritis flares.  He has no joint pain or joint swelling at this time.  He has no morning stiffness.  He is clinically doing well on Arava 10 mg po daily.  He has had a macular rash on bilateral lower extremities for 1 year.  He experiences generalized skin itching at night that keeps him awake at night and is interfering with his daily life.  He is concerned that the rash is a reaction to [place].  He was advised to hold [place] for 1 week to see if the rash and itching resolve.  If the rashes persists and he has increased joint pain, he was advised to resume taking Arava 10 mg po daily.  He was advised to notify us if he develops increased joint pain or joint swelling.  He will follow up on 08/04/19.    High risk medication use - Arava 10 mg 1 tablet by mouth daily.  CBC and CMP were drawn on 01/26/19.  Creatinine was 1.34 and GFR was 52 on 02/23/19.  We will continue to monitor closely.  He is due to update lab work in June and every 3 months.    Idiopathic chronic gout of multiple sites without tophus - He has not had any recent gout flares.  He takes  allopurinol 50 mg by mouth daily for management of gout.   He was advised to avoid shellfish, red meat, and beer.  Uric acid: 10/27/2018 5.5.  Future order for  uric acid is in place.   Primary osteoarthritis of both hands: He has DIP synovial thickening.  He has no synovitis or tenderness.  He has complete fist formation bilaterally.  He has no joint pain or joint stiffness at this time.  Joint protection and muscle strengthening were discussed.    DDD (degenerative disc disease), lumbar: He has no lower back pain or stiffness at this time.   Rash and other nonspecific skin eruption: Macular rash noted on bilateral lower extremities.  Excoriations noted.  He noticed this rash start 1 year ago.  He states the rash does not itch.  He has been evaluated by his dermatologist, Dr. [place] Crane, and according to the patient the rash is due to his history of smoking cigarettes.  He has been applying a topical agent, but he has not noticed any improvement.  He is concerned the rash is a drug reaction to [place] or allopurinol.  He was advised to hold [place] for 1 week.  If the rash does not resolve he will resume Arava and then try holding allopurinol for 1 week.  He was advised to notify us if the rash persists.  We will refer him to an allergist for further evaluation if the rash persists.   Other medical conditions are listed as follows:   History of renal insufficiency syndrome  History of gastroesophageal reflux (GERD)  History of coronary artery disease  History of hypertension  History of hypercholesterolemia  History of prostate cancer   Orders: Orders Placed This Encounter  Procedures   CBC with Differential/Platelet   COMPLETE METABOLIC PANEL WITH GFR   Uric acid   No orders of the defined types were placed in this encounter.   Face-to-face time spent with patient was 30 minutes. Greater than 50% of time was spent in counseling and coordination of care.  Follow-Up Instructions: Return for  Rheumatoid arthritis, Gout.   Hazel Sams PA-C  I examined and evaluated the patient with Hazel Sams PA.  Patient complains of generalized pruritus and he had some rash on his lower extremities.  He has seen dermatologist and has some topical agent.  He is concerned if any of the medications are causing pruritus.  His arthritis is very well controlled.  Have advised him to discontinue Arava for 1 week and see if he has improvement in his rash.  If the rash does not improve then he can stop allopurinol for 1 week and see the response.  If he has persistent symptoms will refer him to an allergist.  The plan of care was discussed as noted above.  Bo Merino, MD  Note - This record has been created using Editor, commissioning.  Chart creation errors have been sought, but may not always  have been located. Such creation errors do not reflect on  the standard of medical care.

## 2019-04-27 ENCOUNTER — Other Ambulatory Visit: Payer: Self-pay

## 2019-04-27 ENCOUNTER — Ambulatory Visit: Payer: Medicare HMO | Admitting: Rheumatology

## 2019-04-27 ENCOUNTER — Encounter: Payer: Self-pay | Admitting: Rheumatology

## 2019-04-27 VITALS — BP 155/72 | HR 52 | Resp 14 | Ht 70.0 in | Wt 196.8 lb

## 2019-04-27 DIAGNOSIS — Z8719 Personal history of other diseases of the digestive system: Secondary | ICD-10-CM | POA: Diagnosis not present

## 2019-04-27 DIAGNOSIS — M5136 Other intervertebral disc degeneration, lumbar region: Secondary | ICD-10-CM

## 2019-04-27 DIAGNOSIS — R21 Rash and other nonspecific skin eruption: Secondary | ICD-10-CM | POA: Diagnosis not present

## 2019-04-27 DIAGNOSIS — Z8679 Personal history of other diseases of the circulatory system: Secondary | ICD-10-CM

## 2019-04-27 DIAGNOSIS — M19041 Primary osteoarthritis, right hand: Secondary | ICD-10-CM | POA: Diagnosis not present

## 2019-04-27 DIAGNOSIS — Z79899 Other long term (current) drug therapy: Secondary | ICD-10-CM | POA: Diagnosis not present

## 2019-04-27 DIAGNOSIS — Z8639 Personal history of other endocrine, nutritional and metabolic disease: Secondary | ICD-10-CM

## 2019-04-27 DIAGNOSIS — Z8546 Personal history of malignant neoplasm of prostate: Secondary | ICD-10-CM

## 2019-04-27 DIAGNOSIS — M0600 Rheumatoid arthritis without rheumatoid factor, unspecified site: Secondary | ICD-10-CM

## 2019-04-27 DIAGNOSIS — M19042 Primary osteoarthritis, left hand: Secondary | ICD-10-CM

## 2019-04-27 DIAGNOSIS — Z87448 Personal history of other diseases of urinary system: Secondary | ICD-10-CM

## 2019-04-27 DIAGNOSIS — M1A09X0 Idiopathic chronic gout, multiple sites, without tophus (tophi): Secondary | ICD-10-CM | POA: Diagnosis not present

## 2019-04-27 NOTE — Patient Instructions (Signed)
Standing Labs We placed an order today for your standing lab work.    Please come back and get your standing labs in June and every 3 months   We have open lab Monday through Friday from 8:30-11:30 AM and 1:30-4:00 PM  at the office of Dr. Shaili Deveshwar.   You may experience shorter wait times on Monday and Friday afternoons. The office is located at [address], [address], [place], [place] [address] No appointment is necessary.   Labs are drawn by [facility].  You may receive a bill from [facility] for your lab work.  If you wish to have your labs drawn at another location, please call the office 24 hours in advance to send orders.  If you have any questions regarding directions or hours of operation,  please call [phone].   Just as a reminder please drink plenty of water prior to coming for your lab work. Thanks!   

## 2019-04-28 ENCOUNTER — Telehealth: Payer: Self-pay | Admitting: Cardiology

## 2019-04-28 NOTE — Telephone Encounter (Signed)
Spoke with pt who reports that after reducing Norvasc to 5 mg his itching only stopped for a day or two. Pt reports that he was seen by Rheumatology on yesterday and his BP was 155/74. Pt discussed itching with Rheumatology and they decided to hold [place] for 1 week to see it the rash and itching get better. Pt would like to know if he should resume 10 mg of Norvasc or continue current dose at this time. Please advise.

## 2019-04-28 NOTE — Telephone Encounter (Signed)
Pt notified and voiced understanding 

## 2019-04-28 NOTE — Telephone Encounter (Signed)
I would not change more than 1 medication at a time as it can make things confusing when trying to sort out drug side effects. That bp won't harm him in the short term. Have him call us in 1 week after the trial is done to see if the arava has caused the rash   J Caria Transue MD

## 2019-04-28 NOTE — Telephone Encounter (Signed)
Pt's wife is calling back for 2 wk check in w/ nurse

## 2019-05-03 ENCOUNTER — Other Ambulatory Visit: Payer: Self-pay | Admitting: Rheumatology

## 2019-05-03 NOTE — Telephone Encounter (Signed)
Last Visit: 04/27/19 Next visit: 05/25/19 Labs: 01/26/19 Creatinine is elevated.   Okay to refill per Dr. Estanislado Pandy

## 2019-05-06 NOTE — Telephone Encounter (Signed)
Ok thanks, have him keep [place] posted. It can be a long tricky process trying to sort out medication side effects. We will see what his pcp is able to determine   Zandra Abts MD

## 2019-05-06 NOTE — Telephone Encounter (Signed)
Please give pt a call concerning BP and medications f/u

## 2019-05-06 NOTE — Telephone Encounter (Signed)
Pt wanted to let Dr. Harl Bowie know that he has stopped allopurinol for a week per his pcp- to see if the rash he has clears up, as it has not as of yet. He is still taking his norvasc 5 mg daily. His blood pressure today is 138/78 HR 58. He wanted to inform Dr. Harl Bowie.

## 2019-05-06 NOTE — Telephone Encounter (Signed)
Called pt. No answer, unable to leave voicemail.  

## 2019-05-11 DIAGNOSIS — E1121 Type 2 diabetes mellitus with diabetic nephropathy: Secondary | ICD-10-CM | POA: Diagnosis not present

## 2019-05-11 DIAGNOSIS — N183 Chronic kidney disease, stage 3 (moderate): Secondary | ICD-10-CM | POA: Diagnosis not present

## 2019-05-11 DIAGNOSIS — I10 Essential (primary) hypertension: Secondary | ICD-10-CM | POA: Diagnosis not present

## 2019-05-11 DIAGNOSIS — M0515 Rheumatoid lung disease with rheumatoid arthritis of hip: Secondary | ICD-10-CM | POA: Diagnosis not present

## 2019-05-12 NOTE — Progress Notes (Signed)
Office Visit Note  Patient: Jacob Farmer             Date of Birth: April 05, 1946           MRN: [id number]             PCP: Redmond School, MD Referring: Redmond School, MD Visit Date: 05/25/2019 Occupation: @GUAROCC @  Subjective:  Generalized itching    History of Present Illness: Jacob Farmer is a 73 y.o. male with history of seronegative rheumatoid arthritis, gout, and osteoarthritis.  He continues to have generalized itching on his skin.  He denies any visible rash at this time.  He states he itches keeps him up at night.  He states he tried stopping Arava and Allopurinol, which did not resolve his itching.  He tried taking some leftover prednisone, which improved his itching.  He has followed up with Dr. Nevada Crane.  He has had allergy testing, which revealed he was allergic to latex.  He states he is not exposed to latex.   He denies any recent rheumatoid arthritis flares or gout flares.  He did not have any increased joint pain or joint swelling while off of Arava and allopurinol.  He denies any joint pain or joint swelling at this time.   Activities of Daily Living:  Patient reports morning stiffness for 0 minute.   Patient Denies nocturnal pain.  Difficulty dressing/grooming: Denies Difficulty climbing stairs: Denies Difficulty getting out of chair: Denies Difficulty using hands for taps, buttons, cutlery, and/or writing: Denies  Review of Systems  Constitutional: Negative for fatigue and night sweats.  HENT: Positive for mouth dryness. Negative for mouth sores and nose dryness.   Eyes: Negative for pain, redness and dryness.  Respiratory: Negative for cough, hemoptysis, shortness of breath, wheezing and difficulty breathing.   Cardiovascular: Negative for chest pain, palpitations, hypertension, irregular heartbeat and swelling in legs/feet.  Gastrointestinal: Negative for abdominal pain, blood in stool, constipation and diarrhea.  Endocrine: Negative for excessive thirst and  increased urination.  Genitourinary: Negative for painful urination.  Musculoskeletal: Negative for arthralgias, joint pain, joint swelling, myalgias, muscle weakness, morning stiffness, muscle tenderness and myalgias.  Skin: Negative for color change, rash, hair loss, nodules/bumps, redness, skin tightness, ulcers and sensitivity to sunlight.  Allergic/Immunologic: Negative for susceptible to infections.  Neurological: Negative for dizziness, fainting, headaches, memory loss, night sweats and weakness.  Hematological: Negative for bruising/bleeding tendency and swollen glands.  Psychiatric/Behavioral: Positive for sleep disturbance. Negative for depressed mood. The patient is not nervous/anxious.     PMFS History:  Patient Active Problem List   Diagnosis Date Noted  . Herniated cervical disc 05/26/2017    Class: Acute  . Herniation of cervical intervertebral disc with radiculopathy 05/26/2017  . Cervical spinal stenosis 05/26/2017  . Lumbar disc disease 02/04/2017  . Primary osteoarthritis of both hands 02/04/2017  . History of gout 02/04/2017  . DDD (degenerative disc disease), lumbar 02/04/2017  . Pain in joint of left knee 02/04/2017  . High risk medication use 01/28/2017  . History of prostate cancer 01/28/2017  . History of renal insufficiency syndrome 01/28/2017  . Malignant neoplasm of prostate ([place]) 11/17/2015  . Chronic kidney disease, stage 3, mod decreased GFR (HCC) 12/27/2011  . Diabetes mellitus, type II ([place]) 12/27/2011  . Rheumatoid arthritis ([place]) 01/04/2011  . SINUS BRADYCARDIA 01/12/2010  . 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 HYPERLIPIDEMIA 01/08/2010  . Hypertension 01/08/2010    Past Medical History:  Diagnosis Date  . Arteriosclerotic  cardiovascular disease (ASCVD) cardiologist-  dr Roderic [facility]   Acute myocardial infarction treated with TPA in 1990; 1999-stents to circumflex and RCA; residual total occlusion of the left anterior  descending; normal ejection fraction; stress nuclear in 2001-distal anteroseptal ischemia; normal LV function  . Chronic kidney disease, stage 3, mod decreased GFR ([place]) nephrologist-  at [facility] in [place]   Creatinine-2.18 and 12/2007, 1.76 and 12/2008  . First degree heart block   . History of gastric ulcer   . History of kidney stones    05/23/17- small = no pain- watching  . History of MI (myocardial infarction)    1990-  treated w/ TPA  . Hyperlipidemia   . Hypertension   . Myocardial infarction ([place]) 1990  . OA (osteoarthritis)   . Pre-diabetes   . Prediabetes   . Prostate cancer [facility]) urologist-- dr dahlstedt/  oncologist- dr Tammi Klippel   Stage T1c , Gleason 3+4,  PSA 4.7,  vol 24cc--  scheduled for radiative seed implants  . Psoriatic arthritis ([place])   . RA (rheumatoid arthritis) ([place])    rheumologist-  dr Toni Amend  . S/P drug eluting coronary stent placement    1999--  DES x2 to CFX and RCA  . Sinus bradycardia   . Wears dentures    upper and lower partial    Family History  Problem Relation Age of Onset  . Heart attack Mother   . Coronary artery disease Father    Past Surgical History:  Procedure Laterality Date  . CARDIOVASCULAR STRESS TEST  2001  per Dr Lattie Haw clinic note   distal anteroseptal ischemia,  normal LVF  . COLONOSCOPY  last one 2006 (approx)  . CORONARY ANGIOPLASTY WITH STENT PLACEMENT  1999   DES x2  to CFX and RCA/  residual total occlusion LAD,  normal LVEF  . EYE SURGERY Bilateral    cataract  . POSTERIOR CERVICAL FUSION/FORAMINOTOMY N/A 05/26/2017   Procedure: RIGHT C4-5 FORAMINOTOMY WITH EXCISION OF HERNIATED [place];  Surgeon: Jessy Oto, MD;  Location: [facility];  Service: Orthopedics;  Laterality: N/A;  . RADIOACTIVE SEED IMPLANT N/A 01/11/2016   Procedure: RADIOACTIVE SEED IMPLANT/BRACHYTHERAPY IMPLANT;  Surgeon: Franchot Gallo, MD;  Location[facility];  Service: Urology;  Laterality: N/A;   71  seeds implanted no  seeds founds in bladder   Social History   Social History Narrative   No regular exercise   Immunization History  Administered Date(s) Administered  . Influenza, High Dose Seasonal PF 09/12/2017     Objective: Vital Signs: BP 138/79 (BP Location: Left Arm, Patient Position: Sitting, Cuff Size: Normal)   Pulse 67   Resp 12   Ht 5\' 10"  (1.778 m)   Wt 196 lb (88.9 kg)   BMI 28.12 kg/m    Physical Exam Vitals signs and nursing note reviewed.  Constitutional:      Appearance: He is well-developed.  HENT:     Head: Normocephalic and atraumatic.  Eyes:     Conjunctiva/sclera: Conjunctivae normal.     Pupils: Pupils are equal, round, and reactive to light.  Neck:     Musculoskeletal: Normal range of motion and neck supple.  Cardiovascular:     Rate and Rhythm: Normal rate and regular rhythm.     Heart sounds: Normal heart sounds.  Pulmonary:     Effort: Pulmonary effort is normal.     Breath sounds: Normal breath sounds.  Abdominal:     General: Bowel sounds are normal.  Palpations: Abdomen is soft.  Lymphadenopathy:     Cervical: No cervical adenopathy.  Skin:    General: Skin is warm and dry.     Capillary Refill: Capillary refill takes less than 2 seconds.     Comments: Macular lesions on bilateral lower extremities   Neurological:     Mental Status: He is alert and oriented to person, place, and time.  Psychiatric:        Behavior: Behavior normal.      Musculoskeletal Exam: C-spine, thoracic spine, lumbar spine good range of motion.  No midline spinal tenderness.  No SI joint tenderness.  Shoulder joints, elbow joints, signs, MCPs, PIPs, DIPs good range of motion no synovitis.  He has DIP synovial thickening.  Hip joints, knee joints and ankle joints, MTPs, PIPs, DIPs good range of motion no synovitis.  No warmth or effusion of knee joints. No tenderness or swelling of ankle joints.    CDAI Exam: CDAI Score: Not documented Patient Global Assessment: Not  documented; Provider Global Assessment: Not documented Swollen: Not documented; Tender: Not documented Joint Exam   Not documented   There is currently no information documented on the homunculus. Go to the Rheumatology activity and complete the homunculus joint exam.  Investigation: No additional findings.  Imaging: No results found.  Recent Labs: Lab Results  Component Value Date   WBC 11.4 (H) 05/18/2019   HGB 13.7 05/18/2019   PLT 243 05/18/2019   NA 139 05/18/2019   K 4.6 05/18/2019   CL 105 05/18/2019   CO2 28 05/18/2019   GLUCOSE 63 (L) 05/18/2019   BUN 31 (H) 05/18/2019   CREATININE 1.99 (H) 05/18/2019   BILITOT 0.5 05/18/2019   ALKPHOS 83 05/26/2017   AST 22 05/18/2019   ALT 33 05/18/2019   PROT 6.7 05/18/2019   ALBUMIN 3.1 (L) 05/26/2017   CALCIUM 9.2 05/18/2019   GFRAA 37 (L) 05/18/2019    Speciality Comments: No specialty comments available.  Procedures:  No procedures performed Allergies: No known allergies   Assessment / Plan:     Visit Diagnoses: Seronegative rheumatoid arthritis ([place]): He has no synovitis on exam.  He has not had any recent rheumatoid arthritis flares.  He is clinically doing well on Arava 10 mg by mouth daily.  He held his dose of Arava for 2 weeks to see if it was contributing to the generalized itching he has been experiencing.  He did not notice any improvement in his symptoms and resumed Arava 10 mg by mouth daily.  He has not experienced any signs or symptoms of a flare while off of [place].  He has no joint pain or joint swelling at this time.  He has no morning stiffness.  He will continue taking Arava as prescribed.  He does not need any refills at this time.  He was advised to notify us if he develops increased joint pain or joint swelling.  He will follow-up in the office in August.    High risk medication use - He is on Arava 10 mg daily.  Most recent CBC within normal limits except for elevated WBC and CMP within normal limits  except for elevated creatinine and decreased GFR on 05/18/2019.  Will monitor every 3 months and standing orders are in place.    Idiopathic chronic gout of multiple sites without tophus -He has not had any recent gout flares. He held his dose of Allopurinol for 2 weeks to see if it was contributing to the generalized  itching he has been experiencing.  His itching does not seem to be related to a drug reaction since he had no improvement while off of allopurinol, so he resumed taking allopurinol 50 mg by mouth daily. Uric acid: 6.1 on 05/18/19.  He was advised to notify us if he develops increased joint pain or joint swelling.   Primary osteoarthritis of both hands: He has PIP and DIP synovial thickening consistent with osteoarthritis.  He has complete fist formation bilaterally.  No synovitis on exam.  Joint protection and muscle strengthening were discussed.   DDD (degenerative disc disease), lumbar: No midline spinal tenderness.  He has no discomfort at this time.   Rash and other nonspecific skin eruption - Macular rash noted on bilateral lower extremities.  Excoriations noted.  He noticed this rash start 1 year ago.  He continues to have generalized itching.  The itching keeps him up at night.  He was evaluated by Dr. Nevada Crane and had allergy testing performed.  According to the patient he is allergic to latex but has not been exposed to latex recently.  He tried holding Arava and allopurinol for 2 weeks to see if they were contributing to his itching.  He had no improvement while off of these medications and resume taking them as prescribed.  We discussed trying an oatmeal bath as well as introducing Zyrtec over-the-counter and Pepcid over-the-counter for symptomatic relief.  He was advised to follow up with Dr. Nevada Crane if his symptoms persist or worsening.   Other medical conditions are listed as follows:   History of hypercholesterolemia  History of coronary artery disease  History of gastroesophageal  reflux (GERD)  History of hypertension  History of renal insufficiency syndrome  History of prostate cancer   Orders: No orders of the defined types were placed in this encounter.  No orders of the defined types were placed in this encounter.     Follow-Up Instructions: Return for Rheumatoid arthritis, Gout.   Ofilia Neas, PA-C  Note - This record has been created using Dragon software.  Chart creation errors have been sought, but may not always  have been located. Such creation errors do not reflect on  the standard of medical care.

## 2019-05-18 ENCOUNTER — Ambulatory Visit (INDEPENDENT_AMBULATORY_CARE_PROVIDER_SITE_OTHER): Payer: Medicare HMO | Admitting: Urology

## 2019-05-18 ENCOUNTER — Other Ambulatory Visit: Payer: Self-pay | Admitting: *Deleted

## 2019-05-18 DIAGNOSIS — C61 Malignant neoplasm of prostate: Secondary | ICD-10-CM

## 2019-05-18 DIAGNOSIS — Z79899 Other long term (current) drug therapy: Secondary | ICD-10-CM

## 2019-05-18 DIAGNOSIS — M1A09X0 Idiopathic chronic gout, multiple sites, without tophus (tophi): Secondary | ICD-10-CM

## 2019-05-18 DIAGNOSIS — N5201 Erectile dysfunction due to arterial insufficiency: Secondary | ICD-10-CM

## 2019-05-19 LAB — CBC WITH DIFFERENTIAL/PLATELET
Absolute Monocytes: 1254 cells/uL — ABNORMAL HIGH (ref 200–950)
Basophils Absolute: 46 cells/uL (ref 0–200)
Basophils Relative: 0.4 %
Eosinophils Absolute: 661 cells/uL — ABNORMAL HIGH (ref 15–500)
Eosinophils Relative: 5.8 %
HCT: 41.6 % (ref 38.5–50.0)
Hemoglobin: 13.7 g/dL (ref 13.2–17.1)
Lymphs Abs: 1493 cells/uL (ref 850–3900)
MCH: 28.2 pg (ref 27.0–33.0)
MCHC: 32.9 g/dL (ref 32.0–36.0)
MCV: 85.6 fL (ref 80.0–100.0)
MPV: 11.3 fL (ref 7.5–12.5)
Monocytes Relative: 11 %
Neutro Abs: 7946 cells/uL — ABNORMAL HIGH (ref 1500–7800)
Neutrophils Relative %: 69.7 %
Platelets: 243 10*3/uL (ref 140–400)
RBC: 4.86 10*6/uL (ref 4.20–5.80)
RDW: 13.2 % (ref 11.0–15.0)
Total Lymphocyte: 13.1 %
WBC: 11.4 10*3/uL — ABNORMAL HIGH (ref 3.8–10.8)

## 2019-05-19 LAB — COMPLETE METABOLIC PANEL WITH GFR
AG Ratio: 1.5 (calc) (ref 1.0–2.5)
ALT: 33 U/L (ref 9–46)
AST: 22 U/L (ref 10–35)
Albumin: 4 g/dL (ref 3.6–5.1)
Alkaline phosphatase (APISO): 97 U/L (ref 35–144)
BUN/Creatinine Ratio: 16 (calc) (ref 6–22)
BUN: 31 mg/dL — ABNORMAL HIGH (ref 7–25)
CO2: 28 mmol/L (ref 20–32)
Calcium: 9.2 mg/dL (ref 8.6–10.3)
Chloride: 105 mmol/L (ref 98–110)
Creat: 1.99 mg/dL — ABNORMAL HIGH (ref 0.70–1.18)
GFR, Est African American: 37 mL/min/{1.73_m2} — ABNORMAL LOW (ref >=60)
GFR, Est Non African American: 32 mL/min/{1.73_m2} — ABNORMAL LOW (ref >=60)
Globulin: 2.7 g/dL (calc) (ref 1.9–3.7)
Glucose, Bld: 63 mg/dL — ABNORMAL LOW (ref 65–139)
Potassium: 4.6 mmol/L (ref 3.5–5.3)
Sodium: 139 mmol/L (ref 135–146)
Total Bilirubin: 0.5 mg/dL (ref 0.2–1.2)
Total Protein: 6.7 g/dL (ref 6.1–8.1)

## 2019-05-19 LAB — URIC ACID: Uric Acid, Serum: 6.1 mg/dL (ref 4.0–8.0)

## 2019-05-19 NOTE — Progress Notes (Signed)
GFR dropped. Please ask him to avoid all NSAIDS. He is only on low dose Allopurinol. Fax results to his PCP. Refer him to nephrology.

## 2019-05-25 ENCOUNTER — Encounter: Payer: Self-pay | Admitting: Physician Assistant

## 2019-05-25 ENCOUNTER — Ambulatory Visit (INDEPENDENT_AMBULATORY_CARE_PROVIDER_SITE_OTHER): Payer: Medicare HMO | Admitting: Physician Assistant

## 2019-05-25 ENCOUNTER — Other Ambulatory Visit: Payer: Self-pay

## 2019-05-25 VITALS — BP 138/79 | HR 67 | Resp 12 | Ht 70.0 in | Wt 196.0 lb

## 2019-05-25 DIAGNOSIS — Z87448 Personal history of other diseases of urinary system: Secondary | ICD-10-CM

## 2019-05-25 DIAGNOSIS — M0600 Rheumatoid arthritis without rheumatoid factor, unspecified site: Secondary | ICD-10-CM

## 2019-05-25 DIAGNOSIS — Z8639 Personal history of other endocrine, nutritional and metabolic disease: Secondary | ICD-10-CM

## 2019-05-25 DIAGNOSIS — Z79899 Other long term (current) drug therapy: Secondary | ICD-10-CM | POA: Diagnosis not present

## 2019-05-25 DIAGNOSIS — R21 Rash and other nonspecific skin eruption: Secondary | ICD-10-CM | POA: Diagnosis not present

## 2019-05-25 DIAGNOSIS — M5136 Other intervertebral disc degeneration, lumbar region: Secondary | ICD-10-CM | POA: Diagnosis not present

## 2019-05-25 DIAGNOSIS — M19042 Primary osteoarthritis, left hand: Secondary | ICD-10-CM

## 2019-05-25 DIAGNOSIS — M1A09X0 Idiopathic chronic gout, multiple sites, without tophus (tophi): Secondary | ICD-10-CM | POA: Diagnosis not present

## 2019-05-25 DIAGNOSIS — Z8679 Personal history of other diseases of the circulatory system: Secondary | ICD-10-CM

## 2019-05-25 DIAGNOSIS — Z8719 Personal history of other diseases of the digestive system: Secondary | ICD-10-CM

## 2019-05-25 DIAGNOSIS — M19041 Primary osteoarthritis, right hand: Secondary | ICD-10-CM | POA: Diagnosis not present

## 2019-05-25 DIAGNOSIS — Z8546 Personal history of malignant neoplasm of prostate: Secondary | ICD-10-CM

## 2019-05-25 NOTE — Patient Instructions (Addendum)
1. You can try soaking in an oatmeal bath 2. Try taking Zyrtec daily (over the counter) 3. Pepcid daily (over the counter)  Both medications block histamine, which should help with the itching.

## 2019-06-04 ENCOUNTER — Telehealth: Payer: Self-pay | Admitting: Specialist

## 2019-06-04 NOTE — Telephone Encounter (Signed)
Pt called in requesting to be set up with dr.newton for an epidural injection  [phone]

## 2019-06-07 NOTE — Telephone Encounter (Signed)
Pt called in requesting to be set up with dr.newton for an epidural injection ----Please advsie

## 2019-06-16 ENCOUNTER — Telehealth: Payer: Self-pay | Admitting: Physical Medicine and Rehabilitation

## 2019-06-16 DIAGNOSIS — N400 Enlarged prostate without lower urinary tract symptoms: Secondary | ICD-10-CM | POA: Diagnosis not present

## 2019-06-16 DIAGNOSIS — I10 Essential (primary) hypertension: Secondary | ICD-10-CM | POA: Diagnosis not present

## 2019-06-16 DIAGNOSIS — Z0001 Encounter for general adult medical examination with abnormal findings: Secondary | ICD-10-CM | POA: Diagnosis not present

## 2019-06-16 DIAGNOSIS — E119 Type 2 diabetes mellitus without complications: Secondary | ICD-10-CM | POA: Diagnosis not present

## 2019-06-16 DIAGNOSIS — R5383 Other fatigue: Secondary | ICD-10-CM | POA: Diagnosis not present

## 2019-06-16 DIAGNOSIS — E1129 Type 2 diabetes mellitus with other diabetic kidney complication: Secondary | ICD-10-CM | POA: Diagnosis not present

## 2019-06-16 DIAGNOSIS — R358 Other polyuria: Secondary | ICD-10-CM | POA: Diagnosis not present

## 2019-06-16 DIAGNOSIS — M069 Rheumatoid arthritis, unspecified: Secondary | ICD-10-CM | POA: Diagnosis not present

## 2019-06-16 DIAGNOSIS — I2510 Atherosclerotic heart disease of native coronary artery without angina pectoris: Secondary | ICD-10-CM | POA: Diagnosis not present

## 2019-06-16 DIAGNOSIS — Z1389 Encounter for screening for other disorder: Secondary | ICD-10-CM | POA: Diagnosis not present

## 2019-06-16 DIAGNOSIS — Z6827 Body mass index (BMI) 27.0-27.9, adult: Secondary | ICD-10-CM | POA: Diagnosis not present

## 2019-06-16 NOTE — Telephone Encounter (Signed)
ok 

## 2019-06-16 NOTE — Telephone Encounter (Signed)
Auth# [id number] Status Auto-Approved Decision Approved Effective Date 06/16/2019 Expiration Date 09/14/2019

## 2019-06-16 NOTE — Telephone Encounter (Signed)
Patient states pain is on both sides now. He had bilateral L4 TF in 2018. Scheduled for 7/15. Needs auth for bilateral [phone].

## 2019-06-17 DIAGNOSIS — E559 Vitamin D deficiency, unspecified: Secondary | ICD-10-CM | POA: Diagnosis not present

## 2019-06-17 DIAGNOSIS — Z0001 Encounter for general adult medical examination with abnormal findings: Secondary | ICD-10-CM | POA: Diagnosis not present

## 2019-06-17 DIAGNOSIS — R5383 Other fatigue: Secondary | ICD-10-CM | POA: Diagnosis not present

## 2019-06-17 DIAGNOSIS — Z1389 Encounter for screening for other disorder: Secondary | ICD-10-CM | POA: Diagnosis not present

## 2019-06-17 DIAGNOSIS — R634 Abnormal weight loss: Secondary | ICD-10-CM | POA: Diagnosis not present

## 2019-06-17 DIAGNOSIS — Z125 Encounter for screening for malignant neoplasm of prostate: Secondary | ICD-10-CM | POA: Diagnosis not present

## 2019-06-17 DIAGNOSIS — Z79899 Other long term (current) drug therapy: Secondary | ICD-10-CM | POA: Diagnosis not present

## 2019-06-23 DIAGNOSIS — E119 Type 2 diabetes mellitus without complications: Secondary | ICD-10-CM | POA: Diagnosis not present

## 2019-06-23 LAB — HM DIABETES EYE EXAM

## 2019-06-28 ENCOUNTER — Other Ambulatory Visit: Payer: Self-pay | Admitting: Internal Medicine

## 2019-06-28 ENCOUNTER — Other Ambulatory Visit (HOSPITAL_COMMUNITY): Payer: Self-pay | Admitting: Internal Medicine

## 2019-06-28 DIAGNOSIS — R6881 Early satiety: Secondary | ICD-10-CM

## 2019-06-28 DIAGNOSIS — R634 Abnormal weight loss: Secondary | ICD-10-CM

## 2019-06-29 ENCOUNTER — Telehealth: Payer: Self-pay | Admitting: Cardiology

## 2019-06-29 DIAGNOSIS — R634 Abnormal weight loss: Secondary | ICD-10-CM | POA: Diagnosis not present

## 2019-06-29 NOTE — Telephone Encounter (Signed)
Virtual Visit Pre-Appointment Phone Call  "(Name), I am calling you today to discuss your upcoming appointment. We are currently trying to limit exposure to the virus that causes COVID-19 by seeing patients at home rather than in the office."  1. "What is the BEST phone number to call the day of the visit?" - [phone] 2.   3. Do you have or have access to (through a family member/friend) a smartphone with video capability that we can use for your visit?" a. If yes - list this number in appt notes as cell (if different from BEST phone #) and list the appointment type as a VIDEO visit in appointment notes b. If no - list the appointment type as a PHONE visit in appointment notes  4. Confirm consent - "In the setting of the current Covid19 crisis, you are scheduled for a (phone or video) visit with your provider on (date) at (time).  Just as we do with many in-office visits, in order for you to participate in this visit, we must obtain consent.  If you'd like, I can send this to your mychart (if signed up) or email for you to review.  Otherwise, I can obtain your verbal consent now.  All virtual visits are billed to your insurance company just like a normal visit would be.  By agreeing to a virtual visit, we'd like you to understand that the technology does not allow for your provider to perform an examination, and thus may limit your provider's ability to fully assess your condition. If your provider identifies any concerns that need to be evaluated in person, we will make arrangements to do so.  Finally, though the technology is pretty good, we cannot assure that it will always work on either your or our end, and in the setting of a video visit, we may have to convert it to a phone-only visit.  In either situation, we cannot ensure that we have a secure connection.  Are you willing to proceed?" STAFF: Did the patient verbally acknowledge consent to telehealth visit? Document YES/NO here:  YES 5.   6. Advise patient to be prepared - "Two hours prior to your appointment, go ahead and check your blood pressure, pulse, oxygen saturation, and your weight (if you have the equipment to check those) and write them all down. When your visit starts, your provider will ask you for this information. If you have an Apple Watch or Kardia device, please plan to have heart rate information ready on the day of your appointment. Please have a pen and paper handy nearby the day of the visit as well."  7. Give patient instructions for MyChart download to smartphone OR [email or web address] as below if video visit (depending on what platform provider is using)  8. Inform patient they will receive a phone call 15 minutes prior to their appointment time (may be from unknown caller ID) so they should be prepared to answer    TELEPHONE CALL NOTE  Jacob Farmer has been deemed a candidate for a follow-up tele-health visit to limit community exposure during the Covid-19 pandemic. I spoke with the patient via phone to ensure availability of phone/video source, confirm preferred email & phone number, and discuss instructions and expectations.  I reminded Jacob Farmer to be prepared with any vital sign and/or heart rhythm information that could potentially be obtained via home monitoring, at the time of his visit. I reminded Jacob Farmer to expect a phone call prior to  his visit.  Chanda Busing 06/29/2019 11:43 AM   INSTRUCTIONS FOR DOWNLOADING THE MYCHART APP TO SMARTPHONE  - The patient must first make sure to have activated MyChart and know their login information - If Apple, go to [employer] and type in MyChart in the search bar and download the app. If Android, ask patient to go to [employer] and type in [place] in the search bar and download the app. The app is free but as with any other app downloads, their phone may require them to verify saved payment information or Apple/Android password.   - The patient will need to then log into the app with their MyChart username and password, and select [place] as their healthcare provider to link the account. When it is time for your visit, go to the MyChart app, find appointments, and click Begin Video Visit. Be sure to Select Allow for your device to access the Microphone and Camera for your visit. You will then be connected, and your provider will be with you shortly.  **If they have any issues connecting, or need assistance please contact MyChart service desk [phone])**  **If using a computer, in order to ensure the best quality for their visit they will need to use either of the following Internet Browsers: [employer], or Google Chrome**  IF USING DOXIMITY or DOXY.ME - The patient will receive a link just prior to their visit by text.     FULL LENGTH CONSENT FOR TELE-HEALTH VISIT   I hereby voluntarily request, consent and authorize [facility] [facility] and its employed or contracted physicians, physician assistants, nurse practitioners or other licensed health care professionals (the Practitioner), to provide me with telemedicine health care services (the Services") as deemed necessary by the treating Practitioner. I acknowledge and consent to receive the Services by the Practitioner via telemedicine. I understand that the telemedicine visit will involve communicating with the Practitioner through live audiovisual communication technology and the disclosure of certain medical information by electronic transmission. I acknowledge that I have been given the opportunity to request an in-person assessment or other available alternative prior to the telemedicine visit and am voluntarily participating in the telemedicine visit.  I understand that I have the right to withhold or withdraw my consent to the use of telemedicine in the course of my care at any time, without affecting my right to future care or treatment, and that  the Practitioner or I may terminate the telemedicine visit at any time. I understand that I have the right to inspect all information obtained and/or recorded in the course of the telemedicine visit and may receive copies of available information for a reasonable fee.  I understand that some of the potential risks of receiving the Services via telemedicine include:   Delay or interruption in medical evaluation due to technological equipment failure or disruption;  Information transmitted may not be sufficient (e.g. poor resolution of images) to allow for appropriate medical decision making by the Practitioner; and/or   In rare instances, security protocols could fail, causing a breach of personal health information.  Furthermore, I acknowledge that it is my responsibility to provide information about my medical history, conditions and care that is complete and accurate to the best of my ability. I acknowledge that Practitioner's advice, recommendations, and/or decision may be based on factors not within their control, such as incomplete or inaccurate data provided by me or distortions of diagnostic images or specimens that may result from electronic transmissions. I  understand that the practice of medicine is not an exact science and that Practitioner makes no warranties or guarantees regarding treatment outcomes. I acknowledge that I will receive a copy of this consent concurrently upon execution via email to the email address I last provided but may also request a printed copy by calling the office of [facility].    I understand that my insurance will be billed for this visit.   I have read or had this consent read to me.  I understand the contents of this consent, which adequately explains the benefits and risks of the Services being provided via telemedicine.   I have been provided ample opportunity to ask questions regarding this consent and the Services and have had my questions answered to  my satisfaction.  I give my informed consent for the services to be provided through the use of telemedicine in my medical care  By participating in this telemedicine visit I agree to the above.

## 2019-06-30 ENCOUNTER — Other Ambulatory Visit: Payer: Self-pay

## 2019-06-30 ENCOUNTER — Ambulatory Visit (INDEPENDENT_AMBULATORY_CARE_PROVIDER_SITE_OTHER): Payer: Medicare HMO | Admitting: Physical Medicine and Rehabilitation

## 2019-06-30 ENCOUNTER — Encounter: Payer: Self-pay | Admitting: Physical Medicine and Rehabilitation

## 2019-06-30 ENCOUNTER — Ambulatory Visit: Payer: Self-pay

## 2019-06-30 VITALS — BP 136/80 | HR 64

## 2019-06-30 DIAGNOSIS — M48062 Spinal stenosis, lumbar region with neurogenic claudication: Secondary | ICD-10-CM | POA: Diagnosis not present

## 2019-06-30 DIAGNOSIS — M5416 Radiculopathy, lumbar region: Secondary | ICD-10-CM

## 2019-06-30 MED ORDER — BETAMETHASONE SOD PHOS & ACET 6 (3-3) MG/ML IJ SUSP
12.0000 mg | Freq: Once | INTRAMUSCULAR | Status: AC
  Administered 2019-06-30: 12 mg

## 2019-06-30 NOTE — Progress Notes (Signed)
.  Numeric Pain Rating Scale and Functional Assessment Average Pain 6   In the last MONTH (on 0-10 scale) has pain interfered with the following?  1. General activity like being  able to carry out your everyday physical activities such as walking, climbing stairs, carrying groceries, or moving a chair?  Rating(5)   +Driver, -BT, -Dye Allergies.  

## 2019-07-01 NOTE — Procedures (Signed)
Lumbosacral Transforaminal Epidural Steroid Injection - Sub-Pedicular Approach with Fluoroscopic Guidance  Patient: Jacob Farmer      Date of Birth: 01/30/1946 MRN: [id number] PCP: Redmond School, MD      Visit Date: 06/30/2019   Universal Protocol:    Date/Time: 06/30/2019  Consent Given By: the patient  Position: PRONE  Additional Comments: Vital signs were monitored before and after the procedure. Patient was prepped and draped in the usual sterile fashion. The correct patient, procedure, and site was verified.   Injection Procedure Details:  Procedure Site One Meds Administered:  Meds ordered this encounter  Medications  . betamethasone acetate-betamethasone sodium phosphate (CELESTONE) injection 12 mg    Laterality: Bilateral  Location/Site:  L4-L5  Needle size: 22 G  Needle type: Spinal  Needle Placement: Transforaminal  Findings:    -Comments: Excellent flow of contrast along the nerve and into the epidural space.  Procedure Details: After squaring off the end-plates to get a true AP view, the C-arm was positioned so that an oblique view of the foramen as noted above was visualized. The target area is just inferior to the "nose of the scotty dog" or sub pedicular. The soft tissues overlying this structure were infiltrated with 2-3 ml. of 1% Lidocaine without Epinephrine.  The spinal needle was inserted toward the target using a "trajectory" view along the fluoroscope beam.  Under AP and lateral visualization, the needle was advanced so it did not puncture dura and was located close the 6 O'Clock position of the pedical in AP tracterory. Biplanar projections were used to confirm position. Aspiration was confirmed to be negative for CSF and/or blood. A 1-2 ml. volume of Isovue-250 was injected and flow of contrast was noted at each level. Radiographs were obtained for documentation purposes.   After attaining the desired flow of contrast documented above, a 0.5  to 1.0 ml test dose of 0.25% Marcaine was injected into each respective transforaminal space.  The patient was observed for 90 seconds post injection.  After no sensory deficits were reported, and normal lower extremity motor function was noted,   the above injectate was administered so that equal amounts of the injectate were placed at each foramen (level) into the transforaminal epidural space.   Additional Comments:  The patient tolerated the procedure well Dressing: 2 x 2 sterile gauze and Band-Aid    Post-procedure details: Patient was observed during the procedure. Post-procedure instructions were reviewed.  Patient left the clinic in stable condition.

## 2019-07-01 NOTE — Progress Notes (Signed)
ENZIO BUCHLER - 73 y.o. male MRN [id number]  Date of birth: Jun 30, 1946  Office Visit Note: Visit Date: 06/30/2019 PCP: Redmond School, MD Referred by: Redmond School, MD  Subjective: Chief Complaint  Patient presents with  . Lower Back - Pain   HPI: Jacob Farmer is a 73 y.o. male who comes in today For planned repeat bilateral neural L4 transforaminal epidural steroid injection as requested by Dr. Basil Dess.  Patient has MRI evidence of disc protrusion and degenerative change at L4-5 with more significant foraminal narrowing at L5 bilaterally.  He reports the last injection which was an L4 transforaminal injection in September gave him quite a bit of relief.  He has multiple medical issues as well with rheumatoid arthritis cardiovascular disease and kidney disease and diabetes.  He has not had any trauma or red flag complaints and will go ahead and repeat the injection today if he is not getting symptomatic relief from that would consider L5 transforaminal injections.  Briefly he did ask about the fact that the last injection seem to help with the chronic state of itching that he has.  I told him that basically the medication was a cortisone or a steroid and that sometimes when this is injected you do absorb some of this intravascularly although it is a small amount and that may have just been a stronger dose of steroid medication that can help with certain conditions. He is seeing a dermatologist.  ROS Otherwise per HPI.  Assessment & Plan: Visit Diagnoses:  1. Lumbar radiculopathy   2. Spinal stenosis of lumbar region with neurogenic claudication     Plan: No additional findings.   Meds & Orders:  Meds ordered this encounter  Medications  . betamethasone acetate-betamethasone sodium phosphate (CELESTONE) injection 12 mg    Orders Placed This Encounter  Procedures  . XR C-ARM NO REPORT  . Epidural Steroid injection    Follow-up: No follow-ups on file.   Procedures: No  procedures performed  Lumbosacral Transforaminal Epidural Steroid Injection - Sub-Pedicular Approach with Fluoroscopic Guidance  Patient: Jacob Farmer      Date of Birth: Apr 10, 1946 MRN: [id number] PCP: Redmond School, MD      Visit Date: 06/30/2019   Universal Protocol:    Date/Time: 06/30/2019  Consent Given By: the patient  Position: PRONE  Additional Comments: Vital signs were monitored before and after the procedure. Patient was prepped and draped in the usual sterile fashion. The correct patient, procedure, and site was verified.   Injection Procedure Details:  Procedure Site One Meds Administered:  Meds ordered this encounter  Medications  . betamethasone acetate-betamethasone sodium phosphate (CELESTONE) injection 12 mg    Laterality: Bilateral  Location/Site:  L4-L5  Needle size: 22 G  Needle type: Spinal  Needle Placement: Transforaminal  Findings:    -Comments: Excellent flow of contrast along the nerve and into the epidural space.  Procedure Details: After squaring off the end-plates to get a true AP view, the C-arm was positioned so that an oblique view of the foramen as noted above was visualized. The target area is just inferior to the "nose of the scotty dog" or sub pedicular. The soft tissues overlying this structure were infiltrated with 2-3 ml. of 1% Lidocaine without Epinephrine.  The spinal needle was inserted toward the target using a "trajectory" view along the fluoroscope beam.  Under AP and lateral visualization, the needle was advanced so it did not puncture dura and was located close the 6  O'Clock position of the pedical in AP tracterory. Biplanar projections were used to confirm position. Aspiration was confirmed to be negative for CSF and/or blood. A 1-2 ml. volume of Isovue-250 was injected and flow of contrast was noted at each level. Radiographs were obtained for documentation purposes.   After attaining the desired flow of contrast  documented above, a 0.5 to 1.0 ml test dose of 0.25% Marcaine was injected into each respective transforaminal space.  The patient was observed for 90 seconds post injection.  After no sensory deficits were reported, and normal lower extremity motor function was noted,   the above injectate was administered so that equal amounts of the injectate were placed at each foramen (level) into the transforaminal epidural space.   Additional Comments:  The patient tolerated the procedure well Dressing: 2 x 2 sterile gauze and Band-Aid    Post-procedure details: Patient was observed during the procedure. Post-procedure instructions were reviewed.  Patient left the clinic in stable condition.    Clinical History: L-spine MRI dated 10/17/2016  L3-4: Annular bulging, small RIGHT subarticular to extra foraminal disc protrusion. Mild facet arthropathy and ligamentum flavum redundancy without canal stenosis. Mild RIGHT neural foraminal narrowing.  L4-5: Small broad-based disc bulge, LEFT subarticular annular fissure. Moderate RIGHT mild LEFT facet arthropathy and ligamentum flavum redundancy without canal stenosis. Minimal RIGHT, mild LEFT neural foraminal narrowing.  L5-S1: Moderate broad-based disc bulge, LEFT central annular fissure. Mild facet arthropathy. No canal stenosis. Moderate to severe RIGHT, severe LEFT neural foraminal narrowing.  IMPRESSION: Degenerative change of the lumbar spine.  No canal stenosis.  Neural foraminal narrowing L3-4 through L5-S1: Severe on the LEFT at L5-S1.   He reports that he quit smoking about 30 years ago. His smoking use included cigarettes. He has a 30.00 pack-year smoking history. He has never used smokeless tobacco.  Recent Labs    10/27/18 1345 05/18/19 1359  LABURIC 5.5 6.1    Objective:  VS:  HT:    WT:   BMI:     BP:136/80  HR:64bpm  TEMP: ( )  RESP:  Physical Exam  Ortho Exam Imaging: Xr C-arm No Report  Result Date:  06/30/2019 Please see Notes tab for imaging impression.   Past Medical/Family/Surgical/Social History: Medications & Allergies reviewed per EMR, new medications updated. Patient Active Problem List   Diagnosis Date Noted  . Herniated cervical disc 05/26/2017    Class: Acute  . Herniation of cervical intervertebral disc with radiculopathy 05/26/2017  . Cervical spinal stenosis 05/26/2017  . Lumbar disc disease 02/04/2017  . Primary osteoarthritis of both hands 02/04/2017  . History of gout 02/04/2017  . DDD (degenerative disc disease), lumbar 02/04/2017  . Pain in joint of left knee 02/04/2017  . High risk medication use 01/28/2017  . History of prostate cancer 01/28/2017  . History of renal insufficiency syndrome 01/28/2017  . Malignant neoplasm of prostate ([place]) 11/17/2015  . Chronic kidney disease, stage 3, mod decreased GFR (HCC) 12/27/2011  . Diabetes mellitus, type II ([place]) 12/27/2011  . Rheumatoid arthritis ([place]) 01/04/2011  . SINUS BRADYCARDIA 01/12/2010  . 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 HYPERLIPIDEMIA 01/08/2010  . Hypertension 01/08/2010   Past Medical History:  Diagnosis Date  . Arteriosclerotic cardiovascular disease (ASCVD) cardiologist-  dr Roderic [facility]   Acute myocardial infarction treated with TPA in 1990; 1999-stents to circumflex and RCA; residual total occlusion of the left anterior descending; normal ejection fraction; stress nuclear in 2001-distal anteroseptal ischemia; normal LV function  . Chronic  kidney disease, stage 3, mod decreased GFR ([place]) nephrologist-  at [facility] in [place]   Creatinine-2.18 and 12/2007, 1.76 and 12/2008  . First degree heart block   . History of gastric ulcer   . History of kidney stones    05/23/17- small = no pain- watching  . History of MI (myocardial infarction)    1990-  treated w/ TPA  . Hyperlipidemia   . Hypertension   . Myocardial infarction ([place]) 1990  . OA (osteoarthritis)   .  Pre-diabetes   . Prediabetes   . Prostate cancer [facility]) urologist-- dr dahlstedt/  oncologist- dr Tammi Klippel   Stage T1c , Gleason 3+4,  PSA 4.7,  vol 24cc--  scheduled for radiative seed implants  . Psoriatic arthritis ([place])   . RA (rheumatoid arthritis) ([place])    rheumologist-  dr Toni Amend  . S/P drug eluting coronary stent placement    1999--  DES x2 to CFX and RCA  . Sinus bradycardia   . Wears dentures    upper and lower partial   Family History  Problem Relation Age of Onset  . Heart attack Mother   . Coronary artery disease Father    Past Surgical History:  Procedure Laterality Date  . CARDIOVASCULAR STRESS TEST  2001  per Dr Lattie Haw clinic note   distal anteroseptal ischemia,  normal LVF  . COLONOSCOPY  last one 2006 (approx)  . CORONARY ANGIOPLASTY WITH STENT PLACEMENT  1999   DES x2  to CFX and RCA/  residual total occlusion LAD,  normal LVEF  . EYE SURGERY Bilateral    cataract  . POSTERIOR CERVICAL FUSION/FORAMINOTOMY N/A 05/26/2017   Procedure: RIGHT C4-5 FORAMINOTOMY WITH EXCISION OF HERNIATED [place];  Surgeon: Jessy Oto, MD;  Location: [facility];  Service: Orthopedics;  Laterality: N/A;  . RADIOACTIVE SEED IMPLANT N/A 01/11/2016   Procedure: RADIOACTIVE SEED IMPLANT/BRACHYTHERAPY IMPLANT;  Surgeon: Franchot Gallo, MD;  Location: [facility];  Service: Urology;  Laterality: N/A;   71  seeds implanted no seeds founds in bladder   Social History   Occupational History  . Occupation: Retired Clinical biochemist  Tobacco Use  . Smoking status: Former Smoker    Packs/day: 1.00    Years: 30.00    Pack years: 30.00    Types: Cigarettes    Quit date: 05/21/1989    Years since quitting: 30.1  . Smokeless tobacco: Never Used  Substance and Sexual Activity  . Alcohol use: Yes    Alcohol/week: 28.0 standard drinks    Types: 14 Shots of liquor, 14 Standard drinks or equivalent per week    Comment: 2-3 oz per day  . Drug use: No  . Sexual activity: Not on file

## 2019-07-15 ENCOUNTER — Ambulatory Visit (HOSPITAL_COMMUNITY)
Admission: RE | Admit: 2019-07-15 | Discharge: 2019-07-15 | Disposition: A | Discharge status: Home / Self Care | Payer: Medicare HMO | Source: Ambulatory Visit | Attending: Internal Medicine | Admitting: Internal Medicine

## 2019-07-15 ENCOUNTER — Other Ambulatory Visit: Payer: Self-pay

## 2019-07-15 DIAGNOSIS — R634 Abnormal weight loss: Secondary | ICD-10-CM

## 2019-07-15 DIAGNOSIS — N200 Calculus of kidney: Secondary | ICD-10-CM | POA: Diagnosis not present

## 2019-07-15 DIAGNOSIS — R6881 Early satiety: Secondary | ICD-10-CM | POA: Insufficient documentation

## 2019-07-15 DIAGNOSIS — N3289 Other specified disorders of bladder: Secondary | ICD-10-CM | POA: Diagnosis not present

## 2019-07-15 MED ORDER — IOHEXOL 300 MG/ML  SOLN
75.0000 mL | Freq: Once | INTRAMUSCULAR | Status: AC | PRN
  Administered 2019-07-15: 75 mL via INTRAVENOUS

## 2019-07-21 ENCOUNTER — Encounter: Payer: Self-pay | Admitting: Cardiology

## 2019-07-21 ENCOUNTER — Telehealth (INDEPENDENT_AMBULATORY_CARE_PROVIDER_SITE_OTHER): Payer: Medicare HMO | Admitting: Cardiology

## 2019-07-21 VITALS — BP 124/71 | HR 67 | Ht 70.0 in | Wt 185.0 lb

## 2019-07-21 DIAGNOSIS — I10 Essential (primary) hypertension: Secondary | ICD-10-CM

## 2019-07-21 DIAGNOSIS — I2510 Atherosclerotic heart disease of native coronary artery without angina pectoris: Secondary | ICD-10-CM | POA: Diagnosis not present

## 2019-07-21 DIAGNOSIS — E782 Mixed hyperlipidemia: Secondary | ICD-10-CM

## 2019-07-21 NOTE — Patient Instructions (Signed)

## 2019-07-21 NOTE — Progress Notes (Signed)
Office Visit Note  Patient: Jacob Farmer             Date of Birth: 1946-08-20           MRN: [id number]             PCP: Maryruth Hancock, MD Referring: Redmond School, MD Visit Date: 08/04/2019 Occupation: @GUAROCC @  Subjective:  Medication monitoring   History of Present Illness: Jacob Farmer is a 73 y.o. male with history of seronegative rheumatoid arthritis and gout. He continues to take Arava 10 mg po daily.  He has not had any recent rheumatoid arthritis flares.  He denies any joint pain or joint swelling at this time.  He denies any morning stiffness. He is taking allopurinol 50 mg po daily for management of gout.  He denies any recent gout flares.  He continues to follow up with his nephrologist regularly.    Activities of Daily Living:  Patient reports morning stiffness for 0 none.   Patient Denies nocturnal pain.  Difficulty dressing/grooming: Denies Difficulty climbing stairs: Denies Difficulty getting out of chair: Denies Difficulty using hands for taps, buttons, cutlery, and/or writing: Denies  Review of Systems  Constitutional: Negative for fatigue and night sweats.  HENT: Negative for mouth sores, mouth dryness and nose dryness.   Eyes: Negative for redness, visual disturbance and dryness.  Respiratory: Negative for cough, hemoptysis, shortness of breath and difficulty breathing.   Cardiovascular: Negative for chest pain, palpitations, hypertension, irregular heartbeat and swelling in legs/feet.  Gastrointestinal: Negative for blood in stool, constipation and diarrhea.  Endocrine: Negative for increased urination.  Genitourinary: Negative for painful urination.  Musculoskeletal: Negative for arthralgias, joint pain, joint swelling, myalgias, muscle weakness, morning stiffness, muscle tenderness and myalgias.  Skin: Positive for rash. Negative for color change, hair loss, nodules/bumps, skin tightness, ulcers and sensitivity to sunlight.  Allergic/Immunologic:  Negative for susceptible to infections.  Neurological: Negative for dizziness, fainting, memory loss, night sweats and weakness.  Hematological: Negative for bruising/bleeding tendency and swollen glands.  Psychiatric/Behavioral: Positive for sleep disturbance. Negative for depressed mood. The patient is not nervous/anxious.     PMFS History:  Patient Active Problem List   Diagnosis Date Noted   Abnormal weight loss 07/31/2019   Abnormal findings on diagnostic imaging of other abdominal regions, including retroperitoneum 07/29/2019   Fatigue 07/29/2019   Herniated cervical disc 05/26/2017    Class: Acute   Herniation of cervical intervertebral disc with radiculopathy 05/26/2017   Cervical spinal stenosis 05/26/2017   Lumbar disc disease 02/04/2017   Primary osteoarthritis of both hands 02/04/2017   History of gout 02/04/2017   DDD (degenerative disc disease), lumbar 02/04/2017   Pain in joint of left knee 02/04/2017   High risk medication use 01/28/2017   History of prostate cancer 01/28/2017   History of renal insufficiency syndrome 01/28/2017   Malignant neoplasm of prostate ([place]) 11/17/2015   Chronic kidney disease, stage 3, mod decreased GFR ([place]) 12/27/2011   Diabetes mellitus, type II ([place]) 12/27/2011   Rheumatoid arthritis ([place]) 01/04/2011   SINUS BRADYCARDIA 01/12/2010   Arteriosclerotic cardiovascular disease (ASCVD) 01/12/2010   HYPERLIPIDEMIA 01/08/2010   Hypertension 01/08/2010    Past Medical History:  Diagnosis Date   Arteriosclerotic cardiovascular disease (ASCVD) cardiologist-  dr Roderic [facility] [facility]   Acute myocardial infarction treated with TPA in 1990; 1999-stents to circumflex and RCA; residual total occlusion of the left anterior descending; normal ejection fraction; stress nuclear in 2001-distal anteroseptal ischemia; normal LV function  Chronic kidney disease, stage 3, mod decreased GFR (HCC) nephrologist-  at [facility] in  [place]   Creatinine-2.18 and 12/2007, 1.76 and 12/2008   First degree heart block    History of gastric ulcer    History of kidney stones    05/23/17- small = no pain- watching   History of MI (myocardial infarction)    1990-  treated w/ TPA   Hyperlipidemia    Hypertension    Myocardial infarction ([place]) 1990   OA (osteoarthritis)    Pre-diabetes    Prediabetes    Prostate cancer [facility]) urologist-- dr dahlstedt/  oncologist- dr Tammi Klippel   Stage T1c , Gleason 3+4,  PSA 4.7,  vol 24cc--  scheduled for radiative seed implants   Psoriatic arthritis ([place])    RA (rheumatoid arthritis) ([place])    rheumologist-  dr Toni Amend   S/P drug eluting coronary stent placement    1999--  DES x2 to CFX and RCA   Sinus bradycardia    Wears dentures    upper and lower partial    Family History  Problem Relation Age of Onset   Heart attack Mother    Coronary artery disease Father    Ulcerative colitis Father    Ulcers Father    Breast cancer Sister    COPD Brother    Pancreatic cancer Brother    Healthy Sister    Diabetes Brother    Cirrhosis Son    Seizures Son    Past Surgical History:  Procedure Laterality Date   CARDIOVASCULAR STRESS TEST  2001  per Dr Lattie Haw clinic note   distal anteroseptal ischemia,  normal LVF   COLONOSCOPY  last one 2006 (approx)   CORONARY ANGIOPLASTY WITH STENT PLACEMENT  1999   DES x2  to CFX and RCA/  residual total occlusion LAD,  normal LVEF   EYE SURGERY Bilateral    cataract   POSTERIOR CERVICAL FUSION/FORAMINOTOMY N/A 05/26/2017   Procedure: RIGHT C4-5 FORAMINOTOMY WITH EXCISION OF HERNIATED [place];  Surgeon: Jessy Oto, MD;  Location: [facility];  Service: Orthopedics;  Laterality: N/A;   RADIOACTIVE SEED IMPLANT N/A 01/11/2016   Procedure: RADIOACTIVE SEED IMPLANT/BRACHYTHERAPY IMPLANT;  Surgeon: Franchot Gallo, MD;  Location: [facility];  Service: Urology;  Laterality: N/A;   71  seeds implanted no seeds  founds in bladder   Social History   Social History Narrative   No regular exercise   Immunization History  Administered Date(s) Administered   Influenza, High Dose Seasonal PF 09/12/2017     Objective: Vital Signs: BP (!) 141/89 (BP Location: Left Arm, Patient Position: Sitting, Cuff Size: Normal)    Pulse 100    Resp 18    Ht 5\' 10"  (1.778 m)    Wt 187 lb 14.4 oz (85.2 kg)    BMI 26.96 kg/m    Physical Exam Vitals signs and nursing note reviewed.  Constitutional:      Appearance: He is well-developed.  HENT:     Head: Normocephalic and atraumatic.  Eyes:     Conjunctiva/sclera: Conjunctivae normal.     Pupils: Pupils are equal, round, and reactive to light.  Neck:     Musculoskeletal: Normal range of motion and neck supple.  Cardiovascular:     Rate and Rhythm: Normal rate and regular rhythm.     Heart sounds: Normal heart sounds.  Pulmonary:     Effort: Pulmonary effort is normal.     Breath sounds: Normal breath sounds.  Abdominal:  General: Bowel sounds are normal.     Palpations: Abdomen is soft.  Skin:    General: Skin is warm and dry.     Capillary Refill: Capillary refill takes less than 2 seconds.  Neurological:     Mental Status: He is alert and oriented to person, place, and time.  Psychiatric:        Behavior: Behavior normal.      Musculoskeletal Exam: C-spine, thoracic spine, and lumbar spine good ROM.  No midline spinal tenderness.  No SI joint tenderness.  Shoulder joints, elbow joints, wrist joints, MCPs, PIPs, and DIPs good ROM with no synovitis.  PIP and DIP synovial thickening.  CMC joint synovial thickening.  Hip joints, knee joints, ankle joints good ROM with no synovitis. No warmth or effusion of knee joints.  No tenderness or swelling of ankle joints.   CDAI Exam: CDAI Score: 0.4  Patient Global: 2 mm; Provider Global: 2 mm Swollen: 0 ; Tender: 0  Joint Exam   No joint exam has been documented for this visit   There is currently no  information documented on the homunculus. Go to the Rheumatology activity and complete the homunculus joint exam.  Investigation: No additional findings.  Imaging: Ct Abdomen Pelvis W Contrast  Result Date: 07/16/2019 CLINICAL DATA:  Weight loss, history of prostate cancer EXAM: CT ABDOMEN AND PELVIS WITH CONTRAST TECHNIQUE: Multidetector CT imaging of the abdomen and pelvis was performed using the standard protocol following bolus administration of intravenous contrast. CONTRAST:  37mL OMNIPAQUE IOHEXOL 300 MG/ML  SOLN COMPARISON:  CT 03/18/2017 FINDINGS: Lower chest: The visualized lung bases are clear. Hepatobiliary: No focal liver abnormality is seen. No gallstones, gallbladder wall thickening, or biliary dilatation. Pancreas: Unremarkable. No pancreatic ductal dilatation or surrounding inflammatory changes. Spleen: Normal in size without focal abnormality. Adrenals/Urinary Tract: Normal adrenal glands. Kidneys enhance symmetrically. 3 mm non-obstructing stone within the mid right kidney. No hydronephrosis. Ureters are nondilated. Mild diffuse urinary bladder wall thickening. Stomach/Bowel: Stomach is within normal limits. Appendix appears normal. There is mild asymmetric wall thickening of the descending portion of the duodenum (series 2, image 30). No surrounding inflammatory changes. No evidence of colonic wall thickening, distention, or inflammatory changes. Vascular/Lymphatic: Extensive calcified and noncalcified atherosclerotic plaques throughout the visualized descending thoracic aorta and throughout the abdominal aorta. No abdominal aortic aneurysm. No abdominal or pelvic lymphadenopathy. Reproductive: Multiple brachytherapy seeds within the prostate bed. Other: No abdominal wall hernia or abnormality. No abdominopelvic ascites. Musculoskeletal: No suspicious bone lesions. Advanced degenerative disc disease at L2-3 and L5-S1, similar to prior. IMPRESSION: 1. No acute abdominopelvic findings. 2.  Mild asymmetric wall thickening of the descending portion of the duodenum, which may reflect changes related to peristalsis versus enteritis. Correlation with endoscopy, if not recently performed, is recommended to exclude the presence of an underlying mass. 3. Advanced aortoiliac atherosclerosis with a heavy noncalcified plaque burden resulting in approximately 50% luminal narrowing of the abdominal aorta just proximal to the bifurcation. 4. Mild diffuse urinary bladder wall thickening, similar appearance to prior CT. Electronically Signed   By: Davina Poke M.D.   On: 07/16/2019 08:20    Recent Labs: Lab Results  Component Value Date   WBC 11.4 (H) 05/18/2019   HGB 13.7 05/18/2019   PLT 243 05/18/2019   NA 139 05/18/2019   K 4.6 05/18/2019   CL 105 05/18/2019   CO2 28 05/18/2019   GLUCOSE 63 (L) 05/18/2019   BUN 31 (H) 05/18/2019   CREATININE 1.99 (H)  05/18/2019   BILITOT 0.5 05/18/2019   ALKPHOS 83 05/26/2017   AST 22 05/18/2019   ALT 33 05/18/2019   PROT 6.7 05/18/2019   ALBUMIN 3.1 (L) 05/26/2017   CALCIUM 9.2 05/18/2019   GFRAA 37 (L) 05/18/2019    Speciality Comments: No specialty comments available.  Procedures:  No procedures performed Allergies: No known allergies   Assessment / Plan:     Visit Diagnoses: Seronegative rheumatoid arthritis ([place]) -He has no synovitis on exam.  He has not had any recent rheumatoid arthritis flares.  He is clinically doing well on Arava 10 mg 1 tablet by mouth daily.  He has no joint pain or joint swelling at this time.  He has no morning stiffness.  He will continue taking Arava 10 mg by mouth daily.  He does not need a refill at this time.  He was advised to notify us if he develops increased joint pain or joint swelling.  He will follow-up in the office in 5 months.  High risk medication use - Arava 10 mg daily.  CBC and CMP were drawn on 06/29/2019.  Creatinine was 1.58.  He continues to follow up closely with his nephrologist.    Idiopathic chronic gout of multiple sites without tophus - He has not had any recent gout flares.  He continues to take allopurinol on 100 mg half tablet by mouth daily.  He has not missed any doses of allopurinol recently.  Uric acid: 05/18/2019 6.1.  He was advised to notify us if he develops signs or symptoms of a gout flare.  He will continue on the current treatment regimen.  Primary osteoarthritis of both hands - He has PIP and DIP synovial thickening consistent with osteoarthritis of bilateral hands.  He has bilateral CMC joint synovial thickening.  He has complete fist formation bilaterally.  He has no tenderness or synovitis.  Joint protection and muscle strengthening were discussed.  DDD (degenerative disc disease), lumbar -He has no lower back pain at this time.   Other medical conditions are listed as follows:   Rash and other nonspecific skin eruption - Macular rash noted on bilateral lower extremities.  Excoriations noted.  He noticed this rash start 1 year ago.   History of prostate cancer   History of gastroesophageal reflux (GERD)   History of coronary artery disease   History of renal insufficiency syndrome - He is followed by a nephrologist.  Patient states that the repeat creatinine was better. He will continue to follow-up with the nephrologist.  He states the drop in creatinine was due to dehydration.  History of hypertension   History of hypercholesterolemia   Orders: No orders of the defined types were placed in this encounter.  No orders of the defined types were placed in this encounter.     Follow-Up Instructions: Return in about 5 months (around 01/04/2020) for Rheumatoid arthritis, Gout.   Ofilia Neas, PA-C   I examined and evaluated the patient with Hazel Sams PA.  Patient had no synovitis on my examination today.  His rheumatoid arthritis is very well controlled on Arava.  He has not had any gout flares.  The plan of care was discussed as noted  above.  Bo Merino, MD Note - This record has been created using Editor, commissioning.  Chart creation errors have been sought, but may not always  have been located. Such creation errors do not reflect on  the standard of medical care.

## 2019-07-21 NOTE — Progress Notes (Signed)
Virtual Visit via Telephone Note   This visit type was conducted due to national recommendations for restrictions regarding the COVID-19 Pandemic (e.g. social distancing) in an effort to limit this patient's exposure and mitigate transmission in our community.  Due to his co-morbid illnesses, this patient is at least at moderate risk for complications without adequate follow up.  This format is felt to be most appropriate for this patient at this time.  The patient did not have access to video technology/had technical difficulties with video requiring transitioning to audio format only (telephone).  All issues noted in this document were discussed and addressed.  No physical exam could be performed with this format.  Please refer to the patient's chart for his  consent to telehealth for [facility].   Date:  07/21/2019   ID:  Jacob Farmer, DOB 1946-09-04, MRN [id number]  Patient Location: Home Provider Location: Home  PCP:  Redmond School, MD  Cardiologist:  Dr Carlyle Dolly MD Electrophysiologist:  None   Evaluation Performed:  Follow-Up Visit  Chief Complaint:  Follow up visit  History of Present Illness:    Jacob Farmer is a 73 y.o. male seen today for follow up of the following medical problems.   1. CAD - multiple interventions as described below - normal LV function per prior clinic notes, there is no documented study in our sytem   - no recent chest pain. No SOB/DOE - compliant with meds    2. HTN - have deferred consideration of ACE to nephrology, has not been on due to CKD  - compliant with meds  - itching after we increased norvasc to 10mg , back down to 5mg  daily.   - bp at home 124/71 by home check.   3. Hyperlipidemia - compliant with statin, labs followed by pcp   4. CKD III - followed by renal Dr Hinda Lenis  5. Prostate CA - had radioactive seeds placed, followed by urology.   6. Rash - unclear etiology. pcp had him hold his  allopurinol and also arava. Around the time of the rash we had increased his norvasc to 10mg  daily, lowered back to prior dose of 5mg  daily - followed by dermatology    The patient does not have symptoms concerning for COVID-19 infection (fever, chills, cough, or new shortness of breath).    Past Medical History:  Diagnosis Date   Arteriosclerotic cardiovascular disease (ASCVD) cardiologist-  dr Roderic Palau [place]   Acute myocardial infarction treated with TPA in 1990; 1999-stents to circumflex and RCA; residual total occlusion of the left anterior descending; normal ejection fraction; stress nuclear in 2001-distal anteroseptal ischemia; normal LV function   Chronic kidney disease, stage 3, mod decreased GFR ([place]) nephrologist-  at [facility] in [place]   Creatinine-2.18 and 12/2007, 1.76 and 12/2008   First degree heart block    History of gastric ulcer    History of kidney stones    05/23/17- small = no pain- watching   History of MI (myocardial infarction)    1990-  treated w/ TPA   Hyperlipidemia    Hypertension    Myocardial infarction ([place]) 1990   OA (osteoarthritis)    Pre-diabetes    Prediabetes    Prostate cancer [facility]) urologist-- dr dahlstedt/  oncologist- dr Tammi Klippel   Stage T1c , Gleason 3+4,  PSA 4.7,  vol 24cc--  scheduled for radiative seed implants   Psoriatic arthritis ([place])    RA (rheumatoid arthritis) ([place])  rheumologist-  dr Toni Amend   S/P drug eluting coronary stent placement    1999--  DES x2 to CFX and RCA   Sinus bradycardia    Wears dentures    upper and lower partial   Past Surgical History:  Procedure Laterality Date   CARDIOVASCULAR STRESS TEST  2001  per Dr Lattie Haw clinic note   distal anteroseptal ischemia,  normal LVF   COLONOSCOPY  last one 2006 (approx)   [place]   DES x2  to CFX and RCA/  residual total occlusion LAD,  normal LVEF   EYE SURGERY Bilateral    cataract    POSTERIOR CERVICAL FUSION/FORAMINOTOMY N/A 05/26/2017   Procedure: RIGHT C4-5 FORAMINOTOMY WITH EXCISION OF HERNIATED [place];  Surgeon: Jessy Oto, MD;  Location: [facility];  Service: Orthopedics;  Laterality: N/A;   RADIOACTIVE SEED IMPLANT N/A 01/11/2016   Procedure: RADIOACTIVE SEED IMPLANT/BRACHYTHERAPY IMPLANT;  Surgeon: Franchot Gallo, MD;  Location: [facility];  Service: Urology;  Laterality: N/A;   71  seeds implanted no seeds founds in bladder     Current Meds  Medication Sig   allopurinol (ZYLOPRIM) 100 MG tablet TAKE 1/2 TABLET (50 MG TOTAL) BY MOUTH DAILY.   amLODipine (NORVASC) 5 MG tablet Take 1 tablet (5 mg total) by mouth daily.   aspirin 81 MG tablet Take 81 mg by mouth every other day.    atorvastatin (LIPITOR) 80 MG tablet Take 1 tablet (80 mg total) by mouth daily. (Patient taking differently: Take 80 mg by mouth every evening. )   folic acid (FOLVITE) 262 MCG tablet Take 400 mcg by mouth daily.   glimepiride (AMARYL) 1 MG tablet Take 1 mg by mouth daily before breakfast.   leflunomide (ARAVA) 10 MG tablet Take 1 tablet (10 mg total) by mouth daily.   metoprolol (LOPRESSOR) 50 MG tablet TAKE ONE TABLET BY MOUTH TWICE DAILY     Allergies:   No known allergies   Social History   Tobacco Use   Smoking status: Former Smoker    Packs/day: 1.00    Years: 30.00    Pack years: 30.00    Types: Cigarettes    Quit date: 05/21/1989    Years since quitting: 30.1   Smokeless tobacco: Never Used  Substance Use Topics   Alcohol use: Yes    Alcohol/week: 28.0 standard drinks    Types: 14 Shots of liquor, 14 Standard drinks or equivalent per week    Comment: 2-3 oz per day   Drug use: No     Family Hx: The patient's family history includes Coronary artery disease in his father; Heart attack in his mother.  ROS:   Please see the history of present illness.     All other systems reviewed and are negative.   Prior CV studies:   The following  studies were reviewed today:    Labs/Other Tests and Data Reviewed:    EKG:  No ECG reviewed.  Recent Labs: 05/18/2019: ALT 33; BUN 31; Creat 1.99; Hemoglobin 13.7; Platelets 243; Potassium 4.6; Sodium 139   Recent Lipid Panel Lab Results  Component Value Date/Time   CHOL 161 01/05/2011 08:22 PM   TRIG 219 (H) 01/05/2011 08:22 PM   HDL 36 (L) 01/05/2011 08:22 PM   CHOLHDL 4.5 Ratio 01/05/2011 08:22 PM   LDLCALC 81 01/05/2011 08:22 PM    Wt Readings from Last 3 Encounters:  07/21/19 185 lb (83.9 kg)  05/25/19 196 lb (88.9  kg)  04/27/19 196 lb 12.8 oz (89.3 kg)     Objective:    Vital Signs:  BP 124/71    Pulse 67    Ht 5\' 10"  (1.778 m)    Wt 185 lb (83.9 kg)    BMI 26.54 kg/m    Today's Vitals   07/21/19 0801  BP: 124/71  Pulse: 67  Weight: 185 lb (83.9 kg)  Height: 5\' 10"  (1.778 m)   Body mass index is 26.54 kg/m.   ASSESSMENT & PLAN:    1. CAD - no recnet symptoms, continue current meds  2. HTN - at goal, continue current meds  3. Hyperlipidemia - request pcp labs, continue statin    COVID-19 Education: The signs and symptoms of COVID-19 were discussed with the patient and how to seek care for testing (follow up with PCP or arrange E-visit).  The importance of social distancing was discussed today.  Time:   Today, I have spent 12 minutes with the patient with telehealth technology discussing the above problems.     Medication Adjustments/Labs and Tests Ordered: Current medicines are reviewed at length with the patient today.  Concerns regarding medicines are outlined above.   Tests Ordered: No orders of the defined types were placed in this encounter.   Medication Changes: No orders of the defined types were placed in this encounter.   Follow Up:  In Person in 6 month(s)  Signed, Carlyle Dolly, MD  07/21/2019 8:29 AM    [place]

## 2019-07-26 ENCOUNTER — Telehealth: Payer: Self-pay | Admitting: Rheumatology

## 2019-07-26 NOTE — Telephone Encounter (Signed)
Patient called requesting labwork orders be sent to [facility] in [place].  Patient will be going tomorrow 07/27/19.

## 2019-07-26 NOTE — Telephone Encounter (Signed)
Advised patient's wife that patient is not quite due for labs yet. Advised her that patient will be due in September.

## 2019-07-27 ENCOUNTER — Encounter: Payer: Self-pay | Admitting: Family Medicine

## 2019-07-27 ENCOUNTER — Other Ambulatory Visit: Payer: Self-pay

## 2019-07-27 ENCOUNTER — Ambulatory Visit (INDEPENDENT_AMBULATORY_CARE_PROVIDER_SITE_OTHER): Payer: Medicare HMO | Admitting: Family Medicine

## 2019-07-27 VITALS — BP 145/78 | HR 62 | Temp 97.5°F | Ht 70.0 in | Wt 187.2 lb

## 2019-07-27 DIAGNOSIS — M069 Rheumatoid arthritis, unspecified: Secondary | ICD-10-CM | POA: Diagnosis not present

## 2019-07-27 DIAGNOSIS — C61 Malignant neoplasm of prostate: Secondary | ICD-10-CM | POA: Diagnosis not present

## 2019-07-27 DIAGNOSIS — I10 Essential (primary) hypertension: Secondary | ICD-10-CM | POA: Diagnosis not present

## 2019-07-27 DIAGNOSIS — E1100 Type 2 diabetes mellitus with hyperosmolarity without nonketotic hyperglycemic-hyperosmolar coma (NKHHC): Secondary | ICD-10-CM

## 2019-07-27 DIAGNOSIS — N183 Chronic kidney disease, stage 3 unspecified: Secondary | ICD-10-CM

## 2019-07-27 DIAGNOSIS — R5383 Other fatigue: Secondary | ICD-10-CM

## 2019-07-27 DIAGNOSIS — M5020 Other cervical disc displacement, unspecified cervical region: Secondary | ICD-10-CM | POA: Diagnosis not present

## 2019-07-27 DIAGNOSIS — M519 Unspecified thoracic, thoracolumbar and lumbosacral intervertebral disc disorder: Secondary | ICD-10-CM

## 2019-07-27 DIAGNOSIS — Z8546 Personal history of malignant neoplasm of prostate: Secondary | ICD-10-CM | POA: Diagnosis not present

## 2019-07-27 DIAGNOSIS — M4802 Spinal stenosis, cervical region: Secondary | ICD-10-CM

## 2019-07-27 DIAGNOSIS — R634 Abnormal weight loss: Secondary | ICD-10-CM

## 2019-07-27 DIAGNOSIS — R935 Abnormal findings on diagnostic imaging of other abdominal regions, including retroperitoneum: Secondary | ICD-10-CM

## 2019-07-27 DIAGNOSIS — Z87448 Personal history of other diseases of urinary system: Secondary | ICD-10-CM

## 2019-07-27 NOTE — Patient Instructions (Addendum)
Call nephrology for appt asap to address abnormal kidney functions noted on prior labwork Take copy of labwork to appt Keep gastro appt on Thursday Take blood pressure at home Keep appointment with dermatology for ongoing rash causing itching

## 2019-07-27 NOTE — Progress Notes (Signed)
New Patient Office Visit  Subjective:  Patient ID: Jacob Farmer, male    DOB: [birth year]  Age: 73 y.o. MRN: [id number]  CC:  Chief Complaint  Patient presents with  . New Patient (Initial Visit)  . Diabetes    HPI Jacob Farmer presents for Pt with weight loss for one month-11lbs in 3 weeks, decreased appetite, decrease energy. Pt states she has felt better since epidural given last week for lumbar disc disease. Pt has appt to discuss CT scan completed 2 weeks ago-pt scheduled to see the gastroenterologist about results.  Abnormal findings  On CT abdomin.  Tob use 25 years. No recent acute gout symptoms. Burning sensation with urination-chronic-radiation possible cause. Nocturia. Pt with improved flow-side effects with medication. 06/16/19-labwork-wellness, blood test follow up requested for follow up   Nephrology-follow up in Sept-cr 1.99 with GFR 32-significant change from previous  Past Medical History:  Diagnosis Date  . Arteriosclerotic cardiovascular disease (ASCVD) cardiologist-  dr Roderic [facility]   Acute myocardial infarction treated with TPA in 1990; 1999-stents to circumflex and RCA; residual total occlusion of the left anterior descending; normal ejection fraction; stress nuclear in 2001-distal anteroseptal ischemia; normal LV function  . Chronic kidney disease, stage 3, mod decreased GFR ([place]) nephrologist-  at [facility] in [place]   Creatinine-2.18 and 12/2007, 1.76 and 12/2008  . First degree heart block   . History of gastric ulcer   . History of kidney stones    05/23/17- small = no pain- watching  . History of MI (myocardial infarction)    1990-  treated w/ TPA  . Hyperlipidemia   . Hypertension   . Myocardial infarction ([place]) 1990  . OA (osteoarthritis)   . Pre-diabetes   . Prediabetes   . Prostate cancer [facility]) urologist-- dr dahlstedt/  oncologist- dr Tammi Klippel   Stage T1c , Gleason 3+4,  PSA 4.7,  vol 24cc--  scheduled for radiative seed implants  .  Psoriatic arthritis ([place])   . RA (rheumatoid arthritis) ([place])    rheumologist-  dr Toni Amend  . S/P drug eluting coronary stent placement    1999--  DES x2 to CFX and RCA  . Sinus bradycardia   . Wears dentures    upper and lower partial    Past Surgical History:  Procedure Laterality Date  . CARDIOVASCULAR STRESS TEST  2001  per Dr Lattie Haw clinic note   distal anteroseptal ischemia,  normal LVF  . COLONOSCOPY  last one 2006 (approx)  . CORONARY ANGIOPLASTY WITH STENT PLACEMENT  1999   DES x2  to CFX and RCA/  residual total occlusion LAD,  normal LVEF  . EYE SURGERY Bilateral    cataract  . POSTERIOR CERVICAL FUSION/FORAMINOTOMY N/A 05/26/2017   Procedure: RIGHT C4-5 FORAMINOTOMY WITH EXCISION OF HERNIATED [place];  Surgeon: Jessy Oto, MD;  Location: [facility];  Service: Orthopedics;  Laterality: N/A;  . RADIOACTIVE SEED IMPLANT N/A 01/11/2016   Procedure: RADIOACTIVE SEED IMPLANT/BRACHYTHERAPY IMPLANT;  Surgeon: Franchot Gallo, MD;  Location: [facility];  Service: Urology;  Laterality: N/A;   65  seeds implanted no seeds founds in bladder    Family History  Problem Relation Age of Onset  . Heart attack Mother   . Coronary artery disease Father     Social History   Socioeconomic History  . Marital status: Married    Spouse name: Not on file  . Number of children: 2  . Years of education: Not on file  .  Highest education level: Not on file  Occupational History  . Occupation: Retired Arboriculturist  . Financial resource strain: Not on file  . Food insecurity    Worry: Not on file    Inability: Not on file  . Transportation needs    Medical: Not on file    Non-medical: Not on file  Tobacco Use  . Smoking status: Former Smoker    Packs/day: 1.00    Years: 30.00    Pack years: 30.00    Types: Cigarettes    Quit date: 05/21/1989    Years since quitting: 30.2  . Smokeless tobacco: Never Used  Substance and Sexual Activity  . Alcohol use:  Yes    Alcohol/week: 20.0 standard drinks    Types: 10 Shots of liquor, 10 Standard drinks or equivalent per week    Comment: 2-3 oz per day  . Drug use: No  . Sexual activity: Not on file  Lifestyle  . Physical activity    Days per week: Not on file    Minutes per session: Not on file  . Stress: Not on file  Relationships  . Social Herbalist on phone: Not on file    Gets together: Not on file    Attends religious service: Not on file    Active member of club or organization: Not on file    Attends meetings of clubs or organizations: Not on file    Relationship status: Not on file  . Intimate partner violence    Fear of current or ex partner: Not on file    Emotionally abused: Not on file    Physically abused: Not on file    Forced sexual activity: Not on file  Other Topics Concern  . Not on file  Social History Narrative   No regular exercise    ROS Review of Systems  Constitutional: Positive for fatigue and unexpected weight change.  HENT: Positive for hearing loss. Negative for ear pain.   Eyes: Negative for visual disturbance.  Respiratory: Negative for cough and shortness of breath.   Cardiovascular: Negative for chest pain and palpitations.  Gastrointestinal: Negative for abdominal distention and abdominal pain.  Endocrine: Negative for polyuria.  Genitourinary: Positive for difficulty urinating. Negative for dysuria.  Musculoskeletal: Positive for back pain and neck pain.  Neurological: Negative for dizziness and headaches.  Psychiatric/Behavioral: The patient is not nervous/anxious.   stents placed 1990 Cataract surgery-bilat-reading glasses only Hearing aid Last colonoscopy 10 years ago-stool testing at home last year-normal Bleeding gastric ulcer DM-amaryl 1mg -no insulin-A1c-6.0% No recent gout attack Renal function abnormal recently-no recent labwork Objective:   Today's Vitals: BP (!) 155/86 (BP Location: Left Arm, Patient Position: Sitting,  Cuff Size: Normal)   Pulse 62   Temp (!) 97.5 F (36.4 C) (Oral)   Ht 5\' 10"  (1.778 m)   Wt 187 lb 3.2 oz (84.9 kg)   SpO2 96%   BMI 26.86 kg/m   Physical Exam Constitutional:      General: He is not in acute distress.    Appearance: He is ill-appearing.  HENT:     Head: Normocephalic and atraumatic.  Neck:     Musculoskeletal: Normal range of motion and neck supple.  Cardiovascular:     Rate and Rhythm: Normal rate and regular rhythm.     Pulses: Normal pulses.     Heart sounds: Normal heart sounds.  Pulmonary:     Effort: Pulmonary effort is normal.  Breath sounds: Normal breath sounds.  Abdominal:     General: Bowel sounds are normal.     Palpations: Abdomen is soft.  Neurological:     Mental Status: He is alert.  Psychiatric:        Mood and Affect: Mood normal.     Assessment & Plan:    Outpatient Encounter Medications as of 07/27/2019  Medication Sig  . allopurinol (ZYLOPRIM) 100 MG tablet TAKE 1/2 TABLET (50 MG TOTAL) BY MOUTH DAILY.  Marland Kitchen amLODipine (NORVASC) 5 MG tablet Take 1 tablet (5 mg total) by mouth daily.  Marland Kitchen aspirin 81 MG tablet Take 81 mg by mouth every other day.   Marland Kitchen atorvastatin (LIPITOR) 80 MG tablet Take 1 tablet (80 mg total) by mouth daily. (Patient taking differently: Take 80 mg by mouth every evening. )  . folic acid (FOLVITE) 320 MCG tablet Take 400 mcg by mouth daily.  Marland Kitchen glimepiride (AMARYL) 1 MG tablet Take 1 mg by mouth daily before breakfast.  . leflunomide (ARAVA) 10 MG tablet Take 1 tablet (10 mg total) by mouth daily.  . metoprolol (LOPRESSOR) 50 MG tablet TAKE ONE TABLET BY MOUTH TWICE DAILY  . [DISCONTINUED] Omega-3 Fatty Acids (FISH OIL) 1000 MG CAPS Take 1,000 mg by mouth daily.   No facility-administered encounter medications on file as of 07/27/2019.   1. Herniated cervical disc Improved with surgery 2. Lumbar disc disease Epidural helping back pain  3. History of prostate cancer 3 months ago seen for follow up-no blood in  urine, continue burning with urination thought to be related to radiation   4. Chronic kidney disease, stage 3, mod decreased GFR (HCC) Change in kidney function-pt to contact for appt to evaluate prior to Sept regular [place] appt-have blood work drawn  5. Essential hypertension Slightly elevated-metoprolol and amlodipine daily Blood pressure monitor Concern for CR elevation and GFR decline-change nephro appt  Cardiology appt in 6 months  6. Type 2 diabetes mellitus with hyperosmolarity without coma, without long-term current use of insulin (HCC) A1c stable, amaryl daily  7. Malignant neoplasm of prostate [facility]) Urology following  8. Rheumatoid arthritis, involving unspecified site, unspecified rheumatoid factor presence ([place]) arava-rheumatology following-stable  9. History of renal insufficiency syndrome Pt to contact nephrology for follow up sooner than Sept-Cr 1.9 , GFR 32  10. Cervical spinal stenosis Stable post operative  11. Abnormal findings on diagnostic imaging of other abdominal regions, including retroperitoneum D/w pt-will see gastro to discuss results-concern for weight loss , fatigue and abnormal findings  12. Fatigue, unspecified type Cbc 11.4  13. Abnormal weight loss Abnormal CT, abnormal cbc Follow-up: 1 month  Bluford Sedler Hannah Beat, MD

## 2019-07-29 ENCOUNTER — Ambulatory Visit (INDEPENDENT_AMBULATORY_CARE_PROVIDER_SITE_OTHER): Payer: Medicare HMO | Admitting: Nurse Practitioner

## 2019-07-29 ENCOUNTER — Other Ambulatory Visit: Payer: Self-pay

## 2019-07-29 ENCOUNTER — Encounter (INDEPENDENT_AMBULATORY_CARE_PROVIDER_SITE_OTHER): Payer: Self-pay | Admitting: Nurse Practitioner

## 2019-07-29 VITALS — BP 164/80 | HR 58 | Temp 98.3°F | Resp 18 | Ht 69.0 in | Wt 187.9 lb

## 2019-07-29 DIAGNOSIS — D649 Anemia, unspecified: Secondary | ICD-10-CM | POA: Diagnosis not present

## 2019-07-29 DIAGNOSIS — Z79899 Other long term (current) drug therapy: Secondary | ICD-10-CM | POA: Diagnosis not present

## 2019-07-29 DIAGNOSIS — B353 Tinea pedis: Secondary | ICD-10-CM | POA: Diagnosis not present

## 2019-07-29 DIAGNOSIS — Z6827 Body mass index (BMI) 27.0-27.9, adult: Secondary | ICD-10-CM | POA: Diagnosis not present

## 2019-07-29 DIAGNOSIS — R5383 Other fatigue: Secondary | ICD-10-CM | POA: Insufficient documentation

## 2019-07-29 DIAGNOSIS — I10 Essential (primary) hypertension: Secondary | ICD-10-CM | POA: Diagnosis not present

## 2019-07-29 DIAGNOSIS — R935 Abnormal findings on diagnostic imaging of other abdominal regions, including retroperitoneum: Secondary | ICD-10-CM

## 2019-07-29 DIAGNOSIS — N400 Enlarged prostate without lower urinary tract symptoms: Secondary | ICD-10-CM | POA: Diagnosis not present

## 2019-07-29 DIAGNOSIS — N183 Chronic kidney disease, stage 3 (moderate): Secondary | ICD-10-CM | POA: Diagnosis not present

## 2019-07-29 DIAGNOSIS — M1991 Primary osteoarthritis, unspecified site: Secondary | ICD-10-CM | POA: Diagnosis not present

## 2019-07-29 DIAGNOSIS — E559 Vitamin D deficiency, unspecified: Secondary | ICD-10-CM | POA: Diagnosis not present

## 2019-07-29 DIAGNOSIS — R809 Proteinuria, unspecified: Secondary | ICD-10-CM | POA: Diagnosis not present

## 2019-07-29 NOTE — Progress Notes (Signed)
Subjective:    Patient ID: Jacob Farmer, male    DOB: 1946/10/31, 73 y.o.   MRN: [id number]  HPI  Patient, prefers to be called "Jacob Farmer" with a significant past medical history of CAD, MI 1990, coronary stent 1999  x 2, HTN, CKD stage III, DM II, prostate cancer with radioactive seed implantation 2017, psoriatic arthritis and a bleeding gastric ulcer 10+ years ago. He presents today for further evaluation after having an abdominal/pelvic CT 07/15/2019 which showed a 55m non-obstructing right kidney stone, mild diffuse urinary bladder wall thickening, mild asymmetric wall thickening of the descending portion of the duodenum. The CT was done due to complaints of fatigue, decreased appetite, early satiety with associated 11 lb weight loss over a 3 week period of time. No nausea, vomiting or abdominal pain. No fever, sweats or chills. He is passing normal formed brown stools daily. No rectal bleeding or melena. He reports having 3 to 4 colonoscopies in his lifetime, never had colon polyps. His last colonoscopy was in 2006. He reported completing a Cologuard Test 1 year ago which was negative. No family history of colon cancer. His father had ulcerative colitis and his brother had pancreatic cancer. He takes ASA 829mevery other day as he bruised easily on daily use. No other NSAIDS. No alcohol use. Quit smoking 30 years ago.  He received a steroid epidural injection for back pain a few week ago and since then his appetite has improved, he has gained 3 or 4 lbs and he no longer feels fatigued.   Labs 06/29/2019: WBC 9.8. Hg 13.5. HCT 40.7. PLT 354. Na 142. K 4.9. BUN 19. Cr. 1.58. AST 26. ALT 38. Alk phos 128. Albumin 3.3.TSH 3.41. HgA1C 6.2%.  Past Medical History:  Diagnosis Date  . Arteriosclerotic cardiovascular disease (ASCVD) cardiologist-  dr joRoderic Palauranch   Acute myocardial infarction treated with TPA in 1990; 1999-stents to circumflex and RCA; residual total occlusion of the left anterior  descending; normal ejection fraction; stress nuclear in 2001-distal anteroseptal ischemia; normal LV function  . Chronic kidney disease, stage 3, mod decreased GFR [place]  [facility] [place] [place] Creatinine-2.18 and 12/2007, 1.76 and 12/2008  . First degree heart block   . History of gastric ulcer   . History of kidney stones    05/23/17- small = no pain- watching  . History of MI (myocardial infarction)    1990-  treated w/ TPA  . Hyperlipidemia   . Hypertension   . Myocardial [place]  . OA (osteoarthritis)   . Pre-diabetes   . Prediabetes   . [facility] dr d[place]t/  oncologist- dr maTammi Klippel Stage T1c , Gleason 3+4,  PSA 4.7,  vol 24cc--  scheduled for radiative seed implants  . Psoriatic arthritis ([place]  . RA (rheumatoid arthritis) ([place]   rheumologist-  dr deToni Amend. S/P drug eluting coronary stent placement    1999--  DES x2 to CFX and RCA  . Sinus bradycardia   . Wears dentures    upper and lower partial   Past Surgical History:  Procedure Laterality Date  . CARDIOVASCULAR STRESS TEST  2001  per Dr RoLattie Hawlinic note   distal anteroseptal ischemia,  normal LVF  . COLONOSCOPY  last one 2006 (approx)  . CORONARY ANGIOPLASTY WITH STENT PLACEMENT  1999   DES x2  to CFX and RCA/  residual total occlusion LAD,  normal LVEF  . EYE  SURGERY Bilateral    cataract  . POSTERIOR CERVICAL FUSION/FORAMINOTOMY N/A 05/26/2017   Procedure: RIGHT C4-5 FORAMINOTOMY WITH EXCISION OF HERNIATED [place];  Surgeon: Jessy Oto, MD;  Location: [facility];  Service: Orthopedics;  Laterality: N/A;  . RADIOACTIVE SEED IMPLANT N/A 01/11/2016   Procedure: RADIOACTIVE SEED IMPLANT/BRACHYTHERAPY IMPLANT;  Surgeon: Franchot Gallo, MD;  [facility];  Service: Urology;  Laterality: N/A;   73  seeds implanted no seeds founds in bladder   Current Outpatient Medications on File Prior to Visit  Medication Sig Dispense Refill   . allopurinol (ZYLOPRIM) 100 MG tablet TAKE 1/2 TABLET (50 MG TOTAL) BY MOUTH DAILY. 45 tablet 0  . amLODipine (NORVASC) 5 MG tablet Take 1 tablet (5 mg total) by mouth daily. 90 tablet 3  . aspirin 81 MG tablet Take 81 mg by mouth every other day.     Marland Kitchen atorvastatin (LIPITOR) 80 MG tablet Take 1 tablet (80 mg total) by mouth daily. (Patient taking differently: Take 80 mg by mouth every evening. ) 90 tablet 3  . folic acid (FOLVITE) 244 MCG tablet Take 400 mcg by mouth daily.    Marland Kitchen glimepiride (AMARYL) 1 MG tablet Take 1 mg by mouth daily before breakfast.    . leflunomide (ARAVA) 10 MG tablet Take 1 tablet (10 mg total) by mouth daily. 90 tablet 0  . metoprolol (LOPRESSOR) 50 MG tablet TAKE ONE TABLET BY MOUTH TWICE DAILY 60 tablet 6  . triamcinolone cream (KENALOG) 0.1 % APPLY TO AFFECTED AREA TWICE DAILY AS NEEDED DO NOT APPLY TO FACE GROIN OR UNDERARMS     No current facility-administered medications on file prior to visit.    Allergies  Allergen Reactions  . No Known Allergies    Social History   Socioeconomic History  . Marital status: Married    Spouse name: Not on file  . Number of children: 2  . Years of education: Not on file  . Highest education level: Not on file  Occupational History  . Occupation: Retired Arboriculturist  . Financial resource strain: Not on file  . Food insecurity    Worry: Not on file    Inability: Not on file  . Transportation needs    Medical: Not on file    Non-medical: Not on file  Tobacco Use  . Smoking status: Former Smoker    Packs/day: 1.00    Years: 30.00    Pack years: 30.00    Types: Cigarettes    Quit date: 05/21/1989    Years since quitting: 30.2  . Smokeless tobacco: Never Used  Substance and Sexual Activity  . Alcohol use: Yes    Alcohol/week: 20.0 standard drinks    Types: 10 Shots of liquor, 10 Standard drinks or equivalent per week    Comment: 2-3 oz per day  . Drug use: No  . Sexual activity: Not on file   Lifestyle  . Physical activity    Days per week: Not on file    Minutes per session: Not on file  . Stress: Not on file  Relationships  . Social Herbalist on phone: Not on file    Gets together: Not on file    Attends religious service: Not on file    Active member of club or organization: Not on file    Attends meetings of clubs or organizations: Not on file    Relationship status: Not on file  . Intimate partner violence  Fear of current or ex partner: Not on file    Emotionally abused: Not on file    Physically abused: Not on file    Forced sexual activity: Not on file  Other Topics Concern  . Not on file  Social History Narrative   No regular exercise   Family History  Problem Relation Age of Onset  . Heart attack Mother   . Coronary artery disease Father   . Ulcerative colitis Father   . Ulcers Father   . Breast cancer Sister   . COPD Brother   . Pancreatic cancer Brother   . Healthy Sister   . Diabetes Brother   . Cirrhosis Son   . Seizures Son    Review of Systems See HPI all other systems reviewed and are negative      Objective:   Physical Exam Blood pressure (!) 164/80, pulse (!) 58, temperature 98.3 F (36.8 C), temperature source Oral, resp. rate 18, height _0  (1.753 m), weight 187 lb 14.4 oz (85.2 kg).  General: well developed male in NAD Eyes: sclera nonicteric, conjunctiva pink Mouth: upper and lower dentures Neck: supple, dime size SQ cystic lesion left cervical area below the left auricle, no lymphadenopathy or thyromegaly Heart: RRR, distant S1,S2, no murmur Lungs: clear throughout  Abdomen: soft, nontender, nondistended, + BS x 4 quads, no HSM Extremities: no edema Rectal: deferred Neuro: alert and oriented x 4, no focal deficits    Assessment & Plan:   86. 73 year old male with unexplained onset of fatigue, decreased appetite and weight loss found to have asymmetric duodenal wall thickening per CT Imaging. His appetite and  energy level has improved after he received a steroid epidural injection for back pain a few weeks ago. -EGD benefits and risks discussed including risk of sedation complications, bleeding and perforation -further follow up to be determined after EGD completed   2. Remote hx of UGI bleed secondary to a gastric ulcer   3. Stable CAD, 2 stents on ASA, followed by cardiologist, Dr. Harl Bowie  4. Urinary bladder wall thickening per CT imaging -recommend urology follow up/cystoscopy   5. CKD  6. DM II  7. Hx of Prostate cancer, radioactive seed implantation

## 2019-07-29 NOTE — Patient Instructions (Signed)
1. Schedule an upper endoscopy   2. Further follow up to be determined after upper endoscopy completed  3. We discussed your colon cancer screening, you reported a negative Cologuard 1 year ago. Please discuss further colon cancer screening recommendations with Dr. Laural Golden

## 2019-07-31 DIAGNOSIS — R634 Abnormal weight loss: Secondary | ICD-10-CM | POA: Insufficient documentation

## 2019-08-02 ENCOUNTER — Encounter (INDEPENDENT_AMBULATORY_CARE_PROVIDER_SITE_OTHER): Payer: Self-pay | Admitting: *Deleted

## 2019-08-04 ENCOUNTER — Encounter: Payer: Self-pay | Admitting: Rheumatology

## 2019-08-04 ENCOUNTER — Ambulatory Visit (INDEPENDENT_AMBULATORY_CARE_PROVIDER_SITE_OTHER): Payer: Medicare HMO | Admitting: Rheumatology

## 2019-08-04 ENCOUNTER — Other Ambulatory Visit: Payer: Self-pay

## 2019-08-04 VITALS — BP 141/89 | HR 100 | Resp 18 | Ht 70.0 in | Wt 187.9 lb

## 2019-08-04 DIAGNOSIS — M0600 Rheumatoid arthritis without rheumatoid factor, unspecified site: Secondary | ICD-10-CM | POA: Diagnosis not present

## 2019-08-04 DIAGNOSIS — Z8639 Personal history of other endocrine, nutritional and metabolic disease: Secondary | ICD-10-CM

## 2019-08-04 DIAGNOSIS — M1A09X0 Idiopathic chronic gout, multiple sites, without tophus (tophi): Secondary | ICD-10-CM

## 2019-08-04 DIAGNOSIS — R21 Rash and other nonspecific skin eruption: Secondary | ICD-10-CM | POA: Diagnosis not present

## 2019-08-04 DIAGNOSIS — Z8679 Personal history of other diseases of the circulatory system: Secondary | ICD-10-CM

## 2019-08-04 DIAGNOSIS — M19041 Primary osteoarthritis, right hand: Secondary | ICD-10-CM | POA: Diagnosis not present

## 2019-08-04 DIAGNOSIS — Z8546 Personal history of malignant neoplasm of prostate: Secondary | ICD-10-CM

## 2019-08-04 DIAGNOSIS — Z79899 Other long term (current) drug therapy: Secondary | ICD-10-CM

## 2019-08-04 DIAGNOSIS — Z8719 Personal history of other diseases of the digestive system: Secondary | ICD-10-CM | POA: Diagnosis not present

## 2019-08-04 DIAGNOSIS — M5136 Other intervertebral disc degeneration, lumbar region: Secondary | ICD-10-CM

## 2019-08-04 DIAGNOSIS — M19042 Primary osteoarthritis, left hand: Secondary | ICD-10-CM

## 2019-08-04 DIAGNOSIS — Z87448 Personal history of other diseases of urinary system: Secondary | ICD-10-CM

## 2019-08-16 DIAGNOSIS — E782 Mixed hyperlipidemia: Secondary | ICD-10-CM | POA: Diagnosis not present

## 2019-08-16 DIAGNOSIS — C61 Malignant neoplasm of prostate: Secondary | ICD-10-CM | POA: Diagnosis not present

## 2019-08-16 DIAGNOSIS — I10 Essential (primary) hypertension: Secondary | ICD-10-CM | POA: Diagnosis not present

## 2019-08-16 DIAGNOSIS — E1129 Type 2 diabetes mellitus with other diabetic kidney complication: Secondary | ICD-10-CM | POA: Diagnosis not present

## 2019-08-17 NOTE — Patient Instructions (Signed)
63    Your procedure is scheduled on: 08/27/2019  Report to [facility] at   8:45  AM.  Call this number if you have problems the morning of surgery: [phone]   Remember:   Do not drink or eat food:After Midnight.      Take these medicines the morning of surgery with A SIP OF WATER: Amlodipine and metoprolol   Do not wear jewelry, make-up or nail polish.  Do not wear lotions, powders, or perfumes. You may wear deodorant.                Do not bring valuables to the hospital.  Contacts, dentures or bridgework may not be worn into surgery.  Leave suitcase in the car. After surgery it may be brought to your room.  For patients admitted to the hospital, checkout time is 11:00 AM the day of discharge.   Patients discharged the day of surgery will not be allowed to drive home.                                                                                                                                    Endoscopy Care After Please read the instructions outlined below and refer to this sheet in the next few weeks. These discharge instructions provide you with general information on caring for yourself after you leave the hospital. Your doctor may also give you specific instructions. While your treatment has been planned according to the most current medical practices available, unavoidable complications occasionally occur. If you have any problems or questions after discharge, please call your doctor. HOME CARE INSTRUCTIONS Activity  You may resume your regular activity but move at a slower pace for the next 24 hours.   Take frequent rest periods for the next 24 hours.   Walking will help expel (get rid of) the air and reduce the bloated feeling in your abdomen.   No driving for 24 hours (because of the anesthesia (medicine) used during the test).   You may shower.   Do not sign any important legal documents or operate any machinery for 24 hours (because of the anesthesia used during  the test).  Nutrition  Drink plenty of fluids.   You may resume your normal diet.   Begin with a light meal and progress to your normal diet.   Avoid alcoholic beverages for 24 hours or as instructed by your caregiver.  Medications You may resume your normal medications unless your caregiver tells you otherwise. What you can expect today  You may experience abdominal discomfort such as a feeling of fullness or "gas" pains.   You may experience a sore throat for 2 to 3 days. This is normal. Gargling with salt water may help this.  Follow-up Your doctor will discuss the results of your test with you. SEEK IMMEDIATE MEDICAL CARE IF:  You have excessive nausea (feeling sick to your stomach) and/or vomiting.  You have severe abdominal pain and distention (swelling).   You have trouble swallowing.   You have a temperature over 100 F (37.8 C).   You have rectal bleeding or vomiting of blood.  Document Released: 07/16/2004 Document Revised: 11/21/2011 Document Reviewed: 01/27/2008

## 2019-08-24 ENCOUNTER — Encounter (HOSPITAL_COMMUNITY)
Admission: RE | Admit: 2019-08-24 | Discharge: 2019-08-24 | Disposition: A | Discharge status: Home / Self Care | Payer: Medicare HMO | Source: Ambulatory Visit | Attending: Internal Medicine | Admitting: Internal Medicine

## 2019-08-24 ENCOUNTER — Other Ambulatory Visit (HOSPITAL_COMMUNITY)
Admission: RE | Admit: 2019-08-24 | Discharge: 2019-08-24 | Disposition: A | Discharge status: Home / Self Care | Payer: Medicare HMO | Source: Ambulatory Visit | Attending: Internal Medicine | Admitting: Internal Medicine

## 2019-08-24 ENCOUNTER — Other Ambulatory Visit: Payer: Self-pay

## 2019-08-24 ENCOUNTER — Encounter (HOSPITAL_COMMUNITY): Payer: Self-pay

## 2019-08-24 DIAGNOSIS — Z7982 Long term (current) use of aspirin: Secondary | ICD-10-CM | POA: Diagnosis not present

## 2019-08-24 DIAGNOSIS — I252 Old myocardial infarction: Secondary | ICD-10-CM | POA: Diagnosis not present

## 2019-08-24 DIAGNOSIS — Z7984 Long term (current) use of oral hypoglycemic drugs: Secondary | ICD-10-CM | POA: Diagnosis not present

## 2019-08-24 DIAGNOSIS — Z8711 Personal history of peptic ulcer disease: Secondary | ICD-10-CM | POA: Diagnosis not present

## 2019-08-24 DIAGNOSIS — R634 Abnormal weight loss: Secondary | ICD-10-CM | POA: Insufficient documentation

## 2019-08-24 DIAGNOSIS — N183 Chronic kidney disease, stage 3 (moderate): Secondary | ICD-10-CM | POA: Diagnosis not present

## 2019-08-24 DIAGNOSIS — K2970 Gastritis, unspecified, without bleeding: Secondary | ICD-10-CM | POA: Insufficient documentation

## 2019-08-24 DIAGNOSIS — Z01812 Encounter for preprocedural laboratory examination: Secondary | ICD-10-CM | POA: Insufficient documentation

## 2019-08-24 DIAGNOSIS — I2510 Atherosclerotic heart disease of native coronary artery without angina pectoris: Secondary | ICD-10-CM | POA: Diagnosis not present

## 2019-08-24 DIAGNOSIS — Z87891 Personal history of nicotine dependence: Secondary | ICD-10-CM | POA: Diagnosis not present

## 2019-08-24 DIAGNOSIS — Z955 Presence of coronary angioplasty implant and graft: Secondary | ICD-10-CM | POA: Diagnosis not present

## 2019-08-24 DIAGNOSIS — B9681 Helicobacter pylori [H. pylori] as the cause of diseases classified elsewhere: Secondary | ICD-10-CM | POA: Diagnosis not present

## 2019-08-24 DIAGNOSIS — L4050 Arthropathic psoriasis, unspecified: Secondary | ICD-10-CM | POA: Diagnosis not present

## 2019-08-24 DIAGNOSIS — R5383 Other fatigue: Secondary | ICD-10-CM | POA: Insufficient documentation

## 2019-08-24 DIAGNOSIS — Z20828 Contact with and (suspected) exposure to other viral communicable diseases: Secondary | ICD-10-CM | POA: Insufficient documentation

## 2019-08-24 DIAGNOSIS — R933 Abnormal findings on diagnostic imaging of other parts of digestive tract: Secondary | ICD-10-CM | POA: Diagnosis not present

## 2019-08-24 DIAGNOSIS — I129 Hypertensive chronic kidney disease with stage 1 through stage 4 chronic kidney disease, or unspecified chronic kidney disease: Secondary | ICD-10-CM | POA: Diagnosis not present

## 2019-08-24 DIAGNOSIS — M069 Rheumatoid arthritis, unspecified: Secondary | ICD-10-CM | POA: Diagnosis not present

## 2019-08-24 DIAGNOSIS — Z8546 Personal history of malignant neoplasm of prostate: Secondary | ICD-10-CM | POA: Diagnosis not present

## 2019-08-24 DIAGNOSIS — E785 Hyperlipidemia, unspecified: Secondary | ICD-10-CM | POA: Diagnosis not present

## 2019-08-24 DIAGNOSIS — Z79899 Other long term (current) drug therapy: Secondary | ICD-10-CM | POA: Diagnosis not present

## 2019-08-24 LAB — BASIC METABOLIC PANEL
Anion gap: 6 (ref 5–15)
BUN: 20 mg/dL (ref 8–23)
CO2: 25 mmol/L (ref 22–32)
Calcium: 9.3 mg/dL (ref 8.9–10.3)
Chloride: 109 mmol/L (ref 98–111)
Creatinine, Ser: 1.67 mg/dL — ABNORMAL HIGH (ref 0.61–1.24)
GFR calc Af Amer: 46 mL/min — ABNORMAL LOW (ref >60)
GFR calc non Af Amer: 39 mL/min — ABNORMAL LOW (ref >60)
Glucose, Bld: 165 mg/dL — ABNORMAL HIGH (ref 70–99)
Potassium: 5.1 mmol/L (ref 3.5–5.1)
Sodium: 140 mmol/L (ref 135–145)

## 2019-08-25 LAB — SARS CORONAVIRUS 2 (TAT 6-24 HRS): SARS Coronavirus 2: NEGATIVE

## 2019-08-27 ENCOUNTER — Encounter (HOSPITAL_COMMUNITY): Payer: Self-pay | Admitting: *Deleted

## 2019-08-27 ENCOUNTER — Ambulatory Visit (HOSPITAL_COMMUNITY): Payer: Medicare HMO | Admitting: Anesthesiology

## 2019-08-27 ENCOUNTER — Ambulatory Visit (HOSPITAL_COMMUNITY)
Admission: RE | Admit: 2019-08-27 | Discharge: 2019-08-27 | Disposition: A | Discharge status: Home / Self Care | Payer: Medicare HMO | Attending: Internal Medicine | Admitting: Internal Medicine

## 2019-08-27 ENCOUNTER — Encounter (HOSPITAL_COMMUNITY)
Admission: RE | Disposition: A | Discharge status: Home / Self Care | Payer: Self-pay | Source: Home / Self Care | Attending: Internal Medicine

## 2019-08-27 ENCOUNTER — Other Ambulatory Visit: Payer: Self-pay

## 2019-08-27 DIAGNOSIS — K259 Gastric ulcer, unspecified as acute or chronic, without hemorrhage or perforation: Secondary | ICD-10-CM | POA: Diagnosis not present

## 2019-08-27 DIAGNOSIS — B9681 Helicobacter pylori [H. pylori] as the cause of diseases classified elsewhere: Secondary | ICD-10-CM | POA: Insufficient documentation

## 2019-08-27 DIAGNOSIS — Z7982 Long term (current) use of aspirin: Secondary | ICD-10-CM | POA: Insufficient documentation

## 2019-08-27 DIAGNOSIS — R935 Abnormal findings on diagnostic imaging of other abdominal regions, including retroperitoneum: Secondary | ICD-10-CM

## 2019-08-27 DIAGNOSIS — Z20828 Contact with and (suspected) exposure to other viral communicable diseases: Secondary | ICD-10-CM | POA: Insufficient documentation

## 2019-08-27 DIAGNOSIS — I129 Hypertensive chronic kidney disease with stage 1 through stage 4 chronic kidney disease, or unspecified chronic kidney disease: Secondary | ICD-10-CM | POA: Insufficient documentation

## 2019-08-27 DIAGNOSIS — E785 Hyperlipidemia, unspecified: Secondary | ICD-10-CM | POA: Insufficient documentation

## 2019-08-27 DIAGNOSIS — Z8711 Personal history of peptic ulcer disease: Secondary | ICD-10-CM | POA: Insufficient documentation

## 2019-08-27 DIAGNOSIS — K2970 Gastritis, unspecified, without bleeding: Secondary | ICD-10-CM | POA: Insufficient documentation

## 2019-08-27 DIAGNOSIS — Z955 Presence of coronary angioplasty implant and graft: Secondary | ICD-10-CM | POA: Insufficient documentation

## 2019-08-27 DIAGNOSIS — R933 Abnormal findings on diagnostic imaging of other parts of digestive tract: Secondary | ICD-10-CM | POA: Insufficient documentation

## 2019-08-27 DIAGNOSIS — Z7984 Long term (current) use of oral hypoglycemic drugs: Secondary | ICD-10-CM | POA: Insufficient documentation

## 2019-08-27 DIAGNOSIS — Z8546 Personal history of malignant neoplasm of prostate: Secondary | ICD-10-CM | POA: Insufficient documentation

## 2019-08-27 DIAGNOSIS — K228 Other specified diseases of esophagus: Secondary | ICD-10-CM | POA: Diagnosis not present

## 2019-08-27 DIAGNOSIS — I2510 Atherosclerotic heart disease of native coronary artery without angina pectoris: Secondary | ICD-10-CM | POA: Diagnosis not present

## 2019-08-27 DIAGNOSIS — N183 Chronic kidney disease, stage 3 (moderate): Secondary | ICD-10-CM | POA: Diagnosis not present

## 2019-08-27 DIAGNOSIS — M069 Rheumatoid arthritis, unspecified: Secondary | ICD-10-CM | POA: Insufficient documentation

## 2019-08-27 DIAGNOSIS — I252 Old myocardial infarction: Secondary | ICD-10-CM | POA: Insufficient documentation

## 2019-08-27 DIAGNOSIS — L4050 Arthropathic psoriasis, unspecified: Secondary | ICD-10-CM | POA: Insufficient documentation

## 2019-08-27 DIAGNOSIS — Z87891 Personal history of nicotine dependence: Secondary | ICD-10-CM | POA: Insufficient documentation

## 2019-08-27 DIAGNOSIS — Z79899 Other long term (current) drug therapy: Secondary | ICD-10-CM | POA: Insufficient documentation

## 2019-08-27 DIAGNOSIS — K2950 Unspecified chronic gastritis without bleeding: Secondary | ICD-10-CM | POA: Diagnosis not present

## 2019-08-27 HISTORY — PX: BIOPSY: SHX5522

## 2019-08-27 HISTORY — PX: ESOPHAGOGASTRODUODENOSCOPY (EGD) WITH PROPOFOL: SHX5813

## 2019-08-27 LAB — GLUCOSE, CAPILLARY
Glucose-Capillary: 95 mg/dL (ref 70–99)
Glucose-Capillary: 98 mg/dL (ref 70–99)

## 2019-08-27 SURGERY — ESOPHAGOGASTRODUODENOSCOPY (EGD) WITH PROPOFOL
Anesthesia: General

## 2019-08-27 MED ORDER — LACTATED RINGERS IV SOLN
INTRAVENOUS | Status: DC
  Administered 2019-08-27: 09:00:00 via INTRAVENOUS

## 2019-08-27 MED ORDER — HYDROCODONE-ACETAMINOPHEN 7.5-325 MG PO TABS: 1.0000 | ORAL_TABLET | Freq: Once | ORAL | Status: DC | PRN

## 2019-08-27 MED ORDER — MIDAZOLAM HCL 2 MG/2ML IJ SOLN: 0.5000 mg | Freq: Once | INTRAMUSCULAR | Status: DC | PRN

## 2019-08-27 MED ORDER — HYDROMORPHONE HCL 1 MG/ML IJ SOLN: 0.2500 mg | INTRAMUSCULAR | Status: DC | PRN

## 2019-08-27 MED ORDER — PROPOFOL 500 MG/50ML IV EMUL
INTRAVENOUS | Status: DC | PRN
  Administered 2019-08-27: 150 ug/kg/min via INTRAVENOUS

## 2019-08-27 MED ORDER — CHLORHEXIDINE GLUCONATE CLOTH 2 % EX PADS: 6.0000 | MEDICATED_PAD | Freq: Once | CUTANEOUS | Status: DC

## 2019-08-27 MED ORDER — PROMETHAZINE HCL 25 MG/ML IJ SOLN: 6.2500 mg | INTRAMUSCULAR | Status: DC | PRN

## 2019-08-27 MED ORDER — KETAMINE HCL 10 MG/ML IJ SOLN
INTRAMUSCULAR | Status: DC | PRN
  Administered 2019-08-27: 10 mg via INTRAVENOUS

## 2019-08-27 MED ORDER — PROPOFOL 10 MG/ML IV BOLUS
INTRAVENOUS | Status: DC | PRN
  Administered 2019-08-27: 20 mg via INTRAVENOUS

## 2019-08-27 MED ORDER — PANTOPRAZOLE SODIUM 40 MG PO TBEC: 40.0000 mg | DELAYED_RELEASE_TABLET | Freq: Two times a day (BID) | ORAL | 2 refills | Status: DC

## 2019-08-27 MED ORDER — KETAMINE HCL 50 MG/5ML IJ SOSY
PREFILLED_SYRINGE | INTRAMUSCULAR | Status: AC
  Filled 2019-08-27: qty 5

## 2019-08-27 NOTE — Transfer of Care (Signed)
Immediate Anesthesia Transfer of Care Note  Patient: Jacob Farmer  Procedure(s) Performed: ESOPHAGOGASTRODUODENOSCOPY (EGD) WITH PROPOFOL (N/A ) BIOPSY  Patient Location: PACU  Anesthesia Type:General  Level of Consciousness: awake, oriented and patient cooperative  Airway & Oxygen Therapy: Patient Spontanous Breathing  Post-op Assessment: Report given to RN and Post -op Vital signs reviewed and stable  Post vital signs: Reviewed and stable  Last Vitals:  Vitals Value Taken Time  BP    Temp    Pulse 54 08/27/19 1000  Resp 20 08/27/19 1000  SpO2 96 % 08/27/19 1000  Vitals shown include unvalidated device data.  Last Pain:  Vitals:   08/27/19 0939  TempSrc:   PainSc: 0-No pain      Patients Stated Pain Goal: 5 (76/19/50 9326)  Complications: No apparent anesthesia complications

## 2019-08-27 NOTE — Op Note (Signed)
[facility] Patient Name: Jacob Farmer Procedure Date: 08/27/2019 9:27 AM MRN: [id number] Date of Birth: [birth year] Attending MD: Hildred Laser , MD CSN: [id number] Age: 73 Admit Type: Outpatient Procedure:                Upper GI endoscopy Indications:              Abnormal CT of the GI tract Providers:                Hildred Laser, MD, Otis Peak B. Sharon Seller, RN, Raphael Gibney, Technician Referring MD:             Rex Kras. Corum, MD Medicines:                Propofol per Anesthesia Complications:            No immediate complications. Estimated Blood Loss:     Estimated blood loss was minimal. Procedure:                Pre-Anesthesia Assessment:                           - Prior to the procedure, a History and Physical                            was performed, and patient medications and                            allergies were reviewed. The patient's tolerance of                            previous anesthesia was also reviewed. The risks                            and benefits of the procedure and the sedation                            options and risks were discussed with the patient.                            All questions were answered, and informed consent                            was obtained. Prior Anticoagulants: The patient has                            taken no previous anticoagulant or antiplatelet                            agents except for aspirin. ASA Grade Assessment:                            III - A patient with severe systemic disease. After  reviewing the risks and benefits, the patient was                            deemed in satisfactory condition to undergo the                            procedure.                           After obtaining informed consent, the endoscope was                            passed under direct vision. Throughout the                            procedure, the patient's blood  pressure, pulse, and                            oxygen saturations were monitored continuously. The                            [id number]-H190 ([id number]) scope was introduced through the                            mouth, and advanced to the second part of duodenum.                            The upper GI endoscopy was accomplished without                            difficulty. The patient tolerated the procedure                            well. Scope In: 9:42:51 AM Scope Out: 9:52:15 AM Total Procedure Duration: 0 hours 9 minutes 24 seconds  Findings:      The examined esophagus was normal.      The Z-line was irregular and was found 40 cm from the incisors.      Patchy mild inflammation characterized by congestion (edema), erosions       and erythema was found in the gastric antrum.      One non-bleeding superficial gastric ulcer was found in the prepyloric       region of the stomach. The lesion was 4 mm in largest dimension.       Biopsies were taken with a cold forceps for histology.      The exam of the stomach was otherwise normal.      The duodenal bulb and second portion of the duodenum were normal. Impression:               - Normal esophagus.                           - Z-line irregular, 40 cm from the incisors.                           - Gastritis.                           -  Non-bleeding gastric ulcer. Biopsied.                           - Normal duodenal bulb and second portion of the                            duodenum. Moderate Sedation:      Per Anesthesia Care Recommendation:           - Resume previous diet today.                           - Continue present medications.                           - Resume aspirin at prior dose tomorrow.                           - Await pathology results.                           - Use Protonix (pantoprazole) 40 mg PO BID. Procedure Code(s):        --- Professional ---                           [phone], Esophagogastroduodenoscopy,  flexible,                            transoral; with biopsy, single or multiple Diagnosis Code(s):        --- Professional ---                           K22.8, Other specified diseases of esophagus                           K29.70, Gastritis, unspecified, without bleeding                           K25.9, Gastric ulcer, unspecified as acute or                            chronic, without hemorrhage or perforation                           R93.3, Abnormal findings on diagnostic imaging of                            other parts of digestive tract CPT copyright 2019 American Medical Association. All rights reserved. The codes documented in this report are preliminary and upon coder review may  be revised to meet current compliance requirements. Hildred Laser, MD Hildred Laser, MD 08/27/2019 10:01:05 AM This report has been signed electronically. Number of Addenda: 0

## 2019-08-27 NOTE — Discharge Instructions (Signed)
Resume aspirin on 08/28/2019.  Always take with food or snack. Resume other medications as before. Pantoprazole 40 mg by mouth 30 minutes before breakfast and evening meal daily. Resume usual diet. No driving for 24 hours. Physician will call with biopsy results.

## 2019-08-27 NOTE — Anesthesia Postprocedure Evaluation (Signed)
Anesthesia Post Note  Patient: Jacob Farmer  Procedure(s) Performed: ESOPHAGOGASTRODUODENOSCOPY (EGD) WITH PROPOFOL (N/A ) BIOPSY  Patient location during evaluation: PACU Anesthesia Type: General Level of consciousness: awake, oriented and patient cooperative Pain management: pain level controlled Vital Signs Assessment: post-procedure vital signs reviewed and stable Respiratory status: spontaneous breathing Cardiovascular status: stable Postop Assessment: no apparent nausea or vomiting Anesthetic complications: no     Last Vitals:  Vitals:   08/27/19 0840 08/27/19 1000  BP: (!) 164/80   Pulse: 85 69  Resp: 20 20  Temp: 36.7 C (P) 36.7 C  SpO2: 98%     Last Pain:  Vitals:   08/27/19 0939  TempSrc:   PainSc: 0-No pain                 Austina Constantin A

## 2019-08-27 NOTE — H&P (Signed)
Jacob Farmer is an 73 y.o. male.   Chief Complaint: Patient is here for esophagogastroduodenoscopy. HPI: Patient is 73 year old Caucasian male with multiple medical problems who developed early satiety anorexia and 11 pound weight loss over 2 months ago.  He did not experience nausea vomiting abdominal pain melena or rectal bleeding.  He underwent abdominal pelvic CT which revealed duodenal wall thickening but no other abnormalities to explain his symptoms.  Patient states he had steroid shot to his back and by the next day his symptoms resolved.  He has gained few pounds back.  He does have remote history of gastric ulcer.  He does take low-dose aspirin.  Past Medical History:  Diagnosis Date  . Arteriosclerotic cardiovascular disease (ASCVD) cardiologist-  dr Roderic [facility] [facility]   Acute myocardial infarction treated with TPA in 1990; 1999-stents to circumflex and RCA; residual total occlusion of the left anterior descending; normal ejection fraction; stress nuclear in 2001-distal anteroseptal ischemia; normal LV function  . Chronic kidney disease, stage 3, mod decreased GFR ([place]) nephrologist-  at [facility] in [place]   Creatinine-2.18 and 12/2007, 1.76 and 12/2008  . First degree heart block   . History of gastric ulcer   . History of kidney stones    05/23/17- small = no pain- watching  . History of MI (myocardial infarction)    1990-  treated w/ TPA  . Hyperlipidemia   . Hypertension   . Myocardial infarction ([place]) 1990  . OA (osteoarthritis)   . Pre-diabetes   . Prediabetes   . Prostate cancer [facility]) urologist-- dr dahlstedt/  oncologist- dr Tammi Klippel   Stage T1c , Gleason 3+4,  PSA 4.7,  vol 24cc--  scheduled for radiative seed implants  . Psoriatic arthritis ([place])   . RA (rheumatoid arthritis) ([place])    rheumologist-  dr Toni Amend  . S/P drug eluting coronary stent placement    1999--  DES x2 to CFX and RCA  . Sinus bradycardia   . Wears dentures    upper and lower partial     Past Surgical History:  Procedure Laterality Date  . CARDIOVASCULAR STRESS TEST  2001  per Dr Lattie Haw clinic note   distal anteroseptal ischemia,  normal LVF  . COLONOSCOPY  last one 2006 (approx)  . CORONARY ANGIOPLASTY WITH STENT PLACEMENT  1999   DES x2  to CFX and RCA/  residual total occlusion LAD,  normal LVEF  . EYE SURGERY Bilateral    cataract  . POSTERIOR CERVICAL FUSION/FORAMINOTOMY N/A 05/26/2017   Procedure: RIGHT C4-5 FORAMINOTOMY WITH EXCISION OF HERNIATED Loiza;  Surgeon: Jessy Oto, MD;  Location: [place];  Service: Orthopedics;  Laterality: N/A;  . RADIOACTIVE SEED IMPLANT N/A 01/11/2016   Procedure: RADIOACTIVE SEED IMPLANT/BRACHYTHERAPY IMPLANT;  Surgeon: Franchot Gallo, MD;  Location: [facility];  Service: Urology;  Laterality: N/A;   23  seeds implanted no seeds founds in bladder    Family History  Problem Relation Age of Onset  . Heart attack Mother   . Coronary artery disease Father   . Ulcerative colitis Father   . Ulcers Father   . Breast cancer Sister   . COPD Brother   . Pancreatic cancer Brother   . Healthy Sister   . Diabetes Brother   . Cirrhosis Son   . Seizures Son    Social History:  reports that he quit smoking about 30 years ago. His smoking use included cigarettes. He has a 30.00 pack-year smoking history. He has never  used smokeless tobacco. He reports current alcohol use of about 20.0 standard drinks of alcohol per week. He reports that he does not use drugs.  Allergies: No Known Allergies  Medications Prior to Admission  Medication Sig Dispense Refill  . allopurinol (ZYLOPRIM) 100 MG tablet TAKE 1/2 TABLET (50 MG TOTAL) BY MOUTH DAILY. (Patient taking differently: Take 50 mg by mouth daily. ) 45 tablet 0  . amLODipine (NORVASC) 5 MG tablet Take 1 tablet (5 mg total) by mouth daily. 90 tablet 3  . amLODipine (NORVASC) 5 MG tablet Take 5 mg by mouth daily.    Marland Kitchen aspirin 81 MG tablet Take 81 mg by mouth 3 (three)  times a week.     Marland Kitchen atorvastatin (LIPITOR) 80 MG tablet Take 1 tablet (80 mg total) by mouth daily. (Patient taking differently: Take 80 mg by mouth every evening. ) 90 tablet 3  . folic acid (FOLVITE) 562 MCG tablet Take 400 mcg by mouth daily.    Marland Kitchen glimepiride (AMARYL) 1 MG tablet Take 1 mg by mouth daily before breakfast.    . leflunomide (ARAVA) 10 MG tablet Take 1 tablet (10 mg total) by mouth daily. 90 tablet 0  . metoprolol (LOPRESSOR) 50 MG tablet TAKE ONE TABLET BY MOUTH TWICE DAILY (Patient taking differently: Take 50 mg by mouth 2 (two) times daily. ) 60 tablet 6  . triamcinolone cream (KENALOG) 0.1 % Apply 1 application topically 3 (three) times daily as needed (itching).     . nitroGLYCERIN (NITROSTAT) 0.4 MG SL tablet Place 0.4 mg under the tongue every 5 (five) minutes as needed for chest pain.       Results for orders placed or performed during the hospital encounter of 08/27/19 (from the past 48 hour(s))  Glucose, capillary     Status: None   Collection Time: 08/27/19  8:55 AM  Result Value Ref Range   Glucose-Capillary 95 70 - 99 mg/dL   No results found.  ROS  Blood pressure (!) 164/80, pulse 85, temperature 98 F (36.7 C), temperature source Oral, resp. rate 20, SpO2 98 %. Physical Exam  Constitutional: He appears well-developed and well-nourished.  HENT:  Mouth/Throat: Oropharynx is clear and moist.  He has few remaining teeth and lower jaw.  He has partial lower and complete upper dental plate that he is not wearing at the present time  Eyes: Conjunctivae are normal. No scleral icterus.  Cardiovascular: Normal rate, regular rhythm and normal heart sounds.  No murmur heard. Respiratory: Effort normal and breath sounds normal.  GI: Soft. He exhibits no distension and no mass. There is no abdominal tenderness.  Musculoskeletal:        General: No edema.  Neurological: He is alert.  Skin: Skin is warm and dry.     Assessment/Plan Duodenal wall thickening on  CT. History of anorexia early satiety and weight loss which has reversed. Diagnostic EGD.  Hildred Laser, MD 08/27/2019, 9:35 AM

## 2019-08-27 NOTE — Anesthesia Preprocedure Evaluation (Signed)
Anesthesia Evaluation  Patient identified by MRN, date of birth, ID band Patient awake  General Assessment Comment:Reports sister delirious after anesthesia   Reviewed: Allergy & Precautions, NPO status , Patient's Chart, lab work & pertinent test results, reviewed documented beta blocker date and time   History of Anesthesia Complications (+) Family history of anesthesia reaction and history of anesthetic complications  Airway Mallampati: II  TM Distance: >3 FB Neck ROM: Full    Dental no notable dental hx. (+) Edentulous Upper, Missing, Poor Dentition   Pulmonary neg pulmonary ROS, former smoker,    Pulmonary exam normal breath sounds clear to auscultation       Cardiovascular Exercise Tolerance: Good hypertension, Pt. on medications and Pt. on home beta blockers + Past MI and + Cardiac Stents  Normal cardiovascular exam+ dysrhythmias I Rhythm:Regular Rate:Normal  H/o SB, appear tachy brady when interviewed  On B blocker -reports MI 1990 , stents 1999 Denies interventions since  Uses stationary bike -[employer] -states no limitations   Neuro/Psych negative neurological ROS  negative psych ROS   GI/Hepatic negative GI ROS, Neg liver ROS,   Endo/Other  diabetes, Type 2, Oral Hypoglycemic Agents  Renal/GU Renal InsufficiencyRenal disease  negative genitourinary   Musculoskeletal  (+) Arthritis , Rheumatoid disorders,    Abdominal   Peds negative pediatric ROS (+)  Hematology negative hematology ROS (+)   Anesthesia Other Findings H/o prostate Ca treated with Radiation ~2017  Reproductive/Obstetrics negative OB ROS                             Anesthesia Physical Anesthesia Plan  ASA: III  Anesthesia Plan: General   Post-op Pain Management:    Induction: Intravenous  PONV Risk Score and Plan: 2 and Propofol infusion, TIVA and Treatment may vary due to age or medical  condition  Airway Management Planned: Nasal Cannula and Simple Face Mask  Additional Equipment:   Intra-op Plan:   Post-operative Plan:   Informed Consent: I have reviewed the patients History and Physical, chart, labs and discussed the procedure including the risks, benefits and alternatives for the proposed anesthesia with the patient or authorized representative who has indicated his/her understanding and acceptance.     Dental advisory given  Plan Discussed with: CRNA  Anesthesia Plan Comments: (Plan Full PPE use  Plan GA with GETA as needed d/w pt -WTP with same after Q&A)        Anesthesia Quick Evaluation

## 2019-08-27 NOTE — Anesthesia Procedure Notes (Signed)
Procedure Name: General with mask airway Date/Time: 08/27/2019 9:38 AM Performed by: Andree Elk, Amy A, CRNA Pre-anesthesia Checklist: Timeout performed, Patient being monitored, Suction available, Emergency Drugs available and Patient identified Patient Re-evaluated:Patient Re-evaluated prior to induction Oxygen Delivery Method: Non-rebreather mask

## 2019-08-30 ENCOUNTER — Telehealth: Payer: Self-pay | Admitting: Rheumatology

## 2019-08-30 ENCOUNTER — Other Ambulatory Visit: Payer: Self-pay | Admitting: *Deleted

## 2019-08-30 ENCOUNTER — Ambulatory Visit: Payer: Medicare HMO | Admitting: Family Medicine

## 2019-08-30 DIAGNOSIS — Z79899 Other long term (current) drug therapy: Secondary | ICD-10-CM

## 2019-08-30 DIAGNOSIS — M1A09X0 Idiopathic chronic gout, multiple sites, without tophus (tophi): Secondary | ICD-10-CM

## 2019-08-30 NOTE — Telephone Encounter (Signed)
Lab orders released.  

## 2019-08-30 NOTE — Telephone Encounter (Signed)
Patient called requesting labwork orders be sent to [facility] on [address] in [place].  Patient states he will be going this afternoon.

## 2019-08-31 ENCOUNTER — Encounter (HOSPITAL_COMMUNITY): Payer: Self-pay | Admitting: Internal Medicine

## 2019-09-01 ENCOUNTER — Ambulatory Visit (INDEPENDENT_AMBULATORY_CARE_PROVIDER_SITE_OTHER): Payer: Medicare HMO | Admitting: Family Medicine

## 2019-09-01 ENCOUNTER — Telehealth: Payer: Self-pay

## 2019-09-01 ENCOUNTER — Other Ambulatory Visit: Payer: Self-pay

## 2019-09-01 ENCOUNTER — Encounter: Payer: Self-pay | Admitting: Family Medicine

## 2019-09-01 ENCOUNTER — Telehealth: Payer: Self-pay | Admitting: Family Medicine

## 2019-09-01 VITALS — BP 170/76 | HR 57 | Temp 98.2°F | Ht 70.0 in | Wt 192.6 lb

## 2019-09-01 DIAGNOSIS — I10 Essential (primary) hypertension: Secondary | ICD-10-CM | POA: Diagnosis not present

## 2019-09-01 DIAGNOSIS — N183 Chronic kidney disease, stage 3 unspecified: Secondary | ICD-10-CM

## 2019-09-01 DIAGNOSIS — R634 Abnormal weight loss: Secondary | ICD-10-CM | POA: Diagnosis not present

## 2019-09-01 DIAGNOSIS — E1100 Type 2 diabetes mellitus with hyperosmolarity without nonketotic hyperglycemic-hyperosmolar coma (NKHHC): Secondary | ICD-10-CM | POA: Diagnosis not present

## 2019-09-01 DIAGNOSIS — L299 Pruritus, unspecified: Secondary | ICD-10-CM | POA: Diagnosis not present

## 2019-09-01 LAB — POCT GLYCOSYLATED HEMOGLOBIN (HGB A1C): Hemoglobin A1C: 5.5 % (ref 4.0–5.6)

## 2019-09-01 MED ORDER — AMLODIPINE BESYLATE 10 MG PO TABS: 10.0000 mg | ORAL_TABLET | Freq: Every day | ORAL | 1 refills | Status: DC

## 2019-09-01 MED ORDER — HYDROXYZINE HCL 10 MG PO TABS: ORAL_TABLET | ORAL | 1 refills | Status: DC

## 2019-09-01 NOTE — Progress Notes (Signed)
Established Patient Office Visit  Subjective:  Patient ID: Jacob Farmer, male    DOB: 1946/11/26  Age: 73 y.o. MRN: [id number]  HW:TUUEKC loss and DM HPI [name] presents for recent weight loss-improved with start of  protonix after EGD 9/11.  Pt notes appetite better. Pt with abnormal CT scan with findings/ recommendation for evaluation : Results reviewed: Mild asymmetric wall thickening of the descending portion of the duodenum, which may reflect changes related to peristalsis versus enteritis. Correlation with endoscopy, if not recently performed, is recommended to exclude the presence of an underlying mass. 3. Advanced aortoiliac atherosclerosis with a heavy noncalcified plaque burden resulting in approximately 50% luminal narrowing of the abdominal aorta just proximal to the bifurcation. 4. Mild diffuse urinary bladder wall thickening, similar appearance to prior CT.  Pt with seronegative rheumatoid arthritis and gout- takes Arava 10 mg po daily.  No recent rheumatoid arthritis flares.  No joint pain or joint swelling at this time. Allopurinol 50 mg po daily for management of gout   Chronic kidney disease-Sept f/u nephro  EGD-ulcer disease-protonix daily-poor appetite has improved  Prostate CA-urology-regular f/u  DM-weight loss noted -lost appetite over the last few months-11 lbs-improved after protonix started  Itching-no current lesions Past Medical History:  Diagnosis Date  . Arteriosclerotic cardiovascular disease (ASCVD) cardiologist-  dr Roderic Palau branch   Acute myocardial infarction treated with TPA in 1990; 1999-stents to circumflex and RCA; residual total occlusion of the left anterior descending; normal ejection fraction; stress nuclear in 2001-distal anteroseptal ischemia; normal LV function  . Chronic kidney disease, stage 3, mod decreased GFR ([place]) nephrologist-  at [facility] in [place]   Creatinine-2.18 and 12/2007, 1.76 and 12/2008  . First  degree heart block   . History of gastric ulcer   . History of kidney stones    05/23/17- small = no pain- watching  . History of MI (myocardial infarction)    1990-  treated w/ TPA  . Hyperlipidemia   . Hypertension   . Myocardial infarction ([place]) 1990  . OA (osteoarthritis)   . Pre-diabetes   . Prediabetes   . Prostate cancer [facility]) urologist-- dr dahlstedt/  oncologist- dr Tammi Klippel   Stage T1c , Gleason 3+4,  PSA 4.7,  vol 24cc--  scheduled for radiative seed implants  . Psoriatic arthritis ([place])   . RA (rheumatoid arthritis) ([place])    rheumologist-  dr Toni Amend  . S/P drug eluting coronary stent placement    1999--  DES x2 to CFX and RCA  . Sinus bradycardia   . Wears dentures    upper and lower partial    Past Surgical History:  Procedure Laterality Date  . BIOPSY  08/27/2019   Procedure: BIOPSY;  Surgeon: Rogene Houston, MD;  [facility];  Service: Endoscopy;;  gastric  . CARDIOVASCULAR STRESS TEST  2001  per Dr Lattie Haw clinic note   distal anteroseptal ischemia,  normal LVF  . COLONOSCOPY  last one 2006 (approx)  . CORONARY ANGIOPLASTY WITH STENT PLACEMENT  1999   DES x2  to CFX and RCA/  residual total occlusion LAD,  normal LVEF  . ESOPHAGOGASTRODUODENOSCOPY (EGD) WITH PROPOFOL N/A 08/27/2019   Procedure: ESOPHAGOGASTRODUODENOSCOPY (EGD) WITH PROPOFOL;  Surgeon: Rogene Houston, MD;  [facility];  Service: Endoscopy;  Laterality: N/A;  10:10  . EYE SURGERY Bilateral    cataract  . POSTERIOR CERVICAL FUSION/FORAMINOTOMY N/A 05/26/2017   Procedure: RIGHT C4-5 FORAMINOTOMY WITH EXCISION OF HERNIATED  [facility];  Surgeon: Jessy Oto, MD;  Location: [employer];  Service: Orthopedics;  Laterality: N/A;  . RADIOACTIVE SEED IMPLANT N/A 01/11/2016   Procedure: RADIOACTIVE SEED IMPLANT/BRACHYTHERAPY IMPLANT;  Surgeon: Franchot Gallo, MD;  Location: [facility];  Service: Urology;  Laterality: N/A;   47  seeds implanted no seeds founds in bladder     Family History  Problem Relation Age of Onset  . Heart attack Mother   . Coronary artery disease Father   . Ulcerative colitis Father   . Ulcers Father   . Breast cancer Sister   . COPD Brother   . Pancreatic cancer Brother   . Healthy Sister   . Diabetes Brother   . Cirrhosis Son   . Seizures Son     Social History   Socioeconomic History  . Marital status: Married    Spouse name: Not on file  . Number of children: 2  . Years of education: Not on file  . Highest education level: Not on file  Occupational History  . Occupation: Retired Arboriculturist  . Financial resource strain: Not on file  . Food insecurity    Worry: Not on file    Inability: Not on file  . Transportation needs    Medical: Not on file    Non-medical: Not on file  Tobacco Use  . Smoking status: Former Smoker    Packs/day: 1.00    Years: 30.00    Pack years: 30.00    Types: Cigarettes    Quit date: 05/21/1989    Years since quitting: 30.3  . Smokeless tobacco: Never Used  Substance and Sexual Activity  . Alcohol use: Yes    Alcohol/week: 20.0 standard drinks    Types: 10 Shots of liquor, 10 Standard drinks or equivalent per week    Comment: 2-3 oz per day  . Drug use: No  . Sexual activity: Not on file  Lifestyle  . Physical activity    Days per week: Not on file    Minutes per session: Not on file  . Stress: Not on file  Relationships  . Social Herbalist on phone: Not on file    Gets together: Not on file    Attends religious service: Not on file    Active member of club or organization: Not on file    Attends meetings of clubs or organizations: Not on file    Relationship status: Not on file  . Intimate partner violence    Fear of current or ex partner: Not on file    Emotionally abused: Not on file    Physically abused: Not on file    Forced sexual activity: Not on file  Other Topics Concern  . Not on file  Social History Narrative   No regular  exercise    No Known Allergies  ROS Review of Systems  Constitutional: Positive for unexpected weight change. Negative for fever.       Improving on PPI  HENT: Negative for sore throat and trouble swallowing.   Eyes: Negative for visual disturbance.  Respiratory: Negative.   Cardiovascular: Negative.   Gastrointestinal: Negative for constipation and diarrhea.       Ulcer on EGD-protonix BID for 90 days Previous ulcer      Objective:    Physical Exam  Constitutional: He appears well-developed and well-nourished. No distress.  HENT:  Head: Atraumatic.  Eyes: Conjunctivae are normal.  Cardiovascular: Normal rate and regular rhythm.  Pulmonary/Chest: Effort normal and breath sounds normal.    BP (!) 170/76 (BP Location: Left Arm, Patient Position: Sitting, Cuff Size: Normal)   Pulse (!) 57   Temp 98.2 F (36.8 C) (Oral)   Ht 5\' 10"  (1.778 m)   Wt 192 lb 9.6 oz (87.4 kg)   SpO2 98%   BMI 27.64 kg/m  Wt Readings from Last 3 Encounters:  09/01/19 192 lb 9.6 oz (87.4 kg)  08/24/19 187 lb (84.8 kg)  08/04/19 187 lb 14.4 oz (85.2 kg)     Health Maintenance Due  Topic Date Due  . Hepatitis C Screening  December 03, 1946  . FOOT EXAM  08/13/1954  . TETANUS/TDAP  08/14/1963  . COLONOSCOPY  08/13/1994  . PNA vac Low Risk Adult (1 of 2 - PCV13) 08/13/2009  . URINE MICROALBUMIN  04/02/2014  . HEMOGLOBIN A1C  11/25/2017  . OPHTHALMOLOGY EXAM  01/28/2019  . INFLUENZA VACCINE  07/17/2019     No results found for: TSH Lab Results  Component Value Date   WBC 11.4 (H) 05/18/2019   HGB 13.7 05/18/2019   HCT 41.6 05/18/2019   MCV 85.6 05/18/2019   PLT 243 05/18/2019   Lab Results  Component Value Date   NA 140 08/24/2019   K 5.1 08/24/2019   CO2 25 08/24/2019   GLUCOSE 165 (H) 08/24/2019   BUN 20 08/24/2019   CREATININE 1.67 (H) 08/24/2019   BILITOT 0.5 05/18/2019   ALKPHOS 83 05/26/2017   AST 22 05/18/2019   ALT 33 05/18/2019   PROT 6.7 05/18/2019   ALBUMIN 3.1 (L)  05/26/2017   CALCIUM 9.3 08/24/2019   ANIONGAP 6 08/24/2019   Lab Results  Component Value Date   CHOL 161 01/05/2011   Lab Results  Component Value Date   HDL 36 (L) 01/05/2011   Lab Results  Component Value Date   LDLCALC 81 01/05/2011   Lab Results  Component Value Date   TRIG 219 (H) 01/05/2011   Lab Results  Component Value Date   CHOLHDL 4.5 Ratio 01/05/2011   Lab Results  Component Value Date   HGBA1C 6.1 (H) 05/26/2017      Assessment & Plan:  1. Type 2 diabetes mellitus with hyperosmolarity without coma, without long-term current use of insulin (HCC) 5.5%-previously 6.2%-pt with no appetite-improved since starting protonix 5 lb weight gain since starting medication 2. Abnormal weight loss GI feel ulcer may have caused weight loss due to lack of appetite Seen on EGD.  No colonoscopy completed. CT abnormal with thickening descending portion of the duodenum.   Protonix started. Pt feeling better. Biopsy completed-pt does not know the result. Pt repeating blood work today for CBC, CMP 3. Essential hypertension Increase amlodipine to 10mg  daily Concern for elevation of blood pressure Diet modifcation 4. Chronic kidney disease, stage 3, mod decreased GFR (HCC) Keep follow up with nephrology 5. Itching Atarax-rx Pt to keep follow up at derm in [facility] for second opinion F/u -3 weeks-recheck blood pressure Kayler Buckholtz Hannah Beat, MD

## 2019-09-01 NOTE — Telephone Encounter (Signed)
Patient was seen in office today and is requesting Amlodipine to be sent to [facility] mail order and the Hydroxyzine can stay at [facility].

## 2019-09-01 NOTE — Telephone Encounter (Signed)
Jacob Farmer, CMA  

## 2019-09-01 NOTE — Patient Instructions (Addendum)
Increase amlodipine to 10mg  daily-follow up in 3 weeks for a recheck  Check blood pressure at home-first thing in the morning  Start Atarax at night for itching  Take blood pressure at home -first thing in the morning  Keep appointment with kidney doctor   Complete blood work

## 2019-09-01 NOTE — Telephone Encounter (Signed)
Done today.

## 2019-09-02 DIAGNOSIS — Z79899 Other long term (current) drug therapy: Secondary | ICD-10-CM | POA: Diagnosis not present

## 2019-09-02 DIAGNOSIS — M1A09X0 Idiopathic chronic gout, multiple sites, without tophus (tophi): Secondary | ICD-10-CM | POA: Diagnosis not present

## 2019-09-03 LAB — COMPLETE METABOLIC PANEL WITH GFR
AG Ratio: 1.6 (calc) (ref 1.0–2.5)
ALT: 16 U/L (ref 9–46)
AST: 15 U/L (ref 10–35)
Albumin: 3.8 g/dL (ref 3.6–5.1)
Alkaline phosphatase (APISO): 114 U/L (ref 35–144)
BUN/Creatinine Ratio: 11 (calc) (ref 6–22)
BUN: 20 mg/dL (ref 7–25)
CO2: 28 mmol/L (ref 20–32)
Calcium: 9 mg/dL (ref 8.6–10.3)
Chloride: 106 mmol/L (ref 98–110)
Creat: 1.88 mg/dL — ABNORMAL HIGH (ref 0.70–1.18)
GFR, Est African American: 40 mL/min/{1.73_m2} — ABNORMAL LOW (ref >=60)
GFR, Est Non African American: 34 mL/min/{1.73_m2} — ABNORMAL LOW (ref >=60)
Globulin: 2.4 g/dL (calc) (ref 1.9–3.7)
Glucose, Bld: 152 mg/dL — ABNORMAL HIGH (ref 65–99)
Potassium: 4.5 mmol/L (ref 3.5–5.3)
Sodium: 141 mmol/L (ref 135–146)
Total Bilirubin: 0.5 mg/dL (ref 0.2–1.2)
Total Protein: 6.2 g/dL (ref 6.1–8.1)

## 2019-09-03 LAB — CBC WITH DIFFERENTIAL/PLATELET
Absolute Monocytes: 689 cells/uL (ref 200–950)
Basophils Absolute: 57 cells/uL (ref 0–200)
Basophils Relative: 0.8 %
Eosinophils Absolute: 1328 cells/uL — ABNORMAL HIGH (ref 15–500)
Eosinophils Relative: 18.7 %
HCT: 38.4 % — ABNORMAL LOW (ref 38.5–50.0)
Hemoglobin: 12.6 g/dL — ABNORMAL LOW (ref 13.2–17.1)
Lymphs Abs: 1022 cells/uL (ref 850–3900)
MCH: 28.8 pg (ref 27.0–33.0)
MCHC: 32.8 g/dL (ref 32.0–36.0)
MCV: 87.7 fL (ref 80.0–100.0)
MPV: 12.4 fL (ref 7.5–12.5)
Monocytes Relative: 9.7 %
Neutro Abs: 4004 cells/uL (ref 1500–7800)
Neutrophils Relative %: 56.4 %
Platelets: 177 10*3/uL (ref 140–400)
RBC: 4.38 10*6/uL (ref 4.20–5.80)
RDW: 14.8 % (ref 11.0–15.0)
Total Lymphocyte: 14.4 %
WBC: 7.1 10*3/uL (ref 3.8–10.8)

## 2019-09-03 LAB — URIC ACID: Uric Acid, Serum: 6 mg/dL (ref 4.0–8.0)

## 2019-09-03 NOTE — Progress Notes (Signed)
Creatinine elevated-1.88 and GFR is low-34.  Please forward to nephrologist. Glucose is elevated-152.  Hgb and Hct are low.  We will continue to monitor.

## 2019-09-03 NOTE — Progress Notes (Signed)
Uric acid is 6.0.  Please advise patient to continue taking allopurinol as prescribed.

## 2019-09-04 ENCOUNTER — Other Ambulatory Visit (INDEPENDENT_AMBULATORY_CARE_PROVIDER_SITE_OTHER): Payer: Self-pay | Admitting: Internal Medicine

## 2019-09-04 MED ORDER — PYLERA 140-125-125 MG PO CAPS: 3.0000 | ORAL_CAPSULE | Freq: Three times a day (TID) | ORAL | 0 refills | Status: DC

## 2019-09-08 DIAGNOSIS — R809 Proteinuria, unspecified: Secondary | ICD-10-CM | POA: Diagnosis not present

## 2019-09-08 DIAGNOSIS — D649 Anemia, unspecified: Secondary | ICD-10-CM | POA: Diagnosis not present

## 2019-09-08 DIAGNOSIS — E559 Vitamin D deficiency, unspecified: Secondary | ICD-10-CM | POA: Diagnosis not present

## 2019-09-08 DIAGNOSIS — Z79899 Other long term (current) drug therapy: Secondary | ICD-10-CM | POA: Diagnosis not present

## 2019-09-08 DIAGNOSIS — N183 Chronic kidney disease, stage 3 (moderate): Secondary | ICD-10-CM | POA: Diagnosis not present

## 2019-09-15 DIAGNOSIS — C61 Malignant neoplasm of prostate: Secondary | ICD-10-CM | POA: Diagnosis not present

## 2019-09-15 DIAGNOSIS — I2510 Atherosclerotic heart disease of native coronary artery without angina pectoris: Secondary | ICD-10-CM | POA: Diagnosis not present

## 2019-09-15 DIAGNOSIS — I10 Essential (primary) hypertension: Secondary | ICD-10-CM | POA: Diagnosis not present

## 2019-09-16 DIAGNOSIS — N1831 Chronic kidney disease, stage 3a: Secondary | ICD-10-CM | POA: Diagnosis not present

## 2019-09-16 DIAGNOSIS — N189 Chronic kidney disease, unspecified: Secondary | ICD-10-CM | POA: Diagnosis not present

## 2019-09-16 DIAGNOSIS — R808 Other proteinuria: Secondary | ICD-10-CM | POA: Diagnosis not present

## 2019-09-16 DIAGNOSIS — D631 Anemia in chronic kidney disease: Secondary | ICD-10-CM | POA: Diagnosis not present

## 2019-09-16 DIAGNOSIS — E211 Secondary hyperparathyroidism, not elsewhere classified: Secondary | ICD-10-CM | POA: Diagnosis not present

## 2019-09-22 DIAGNOSIS — B354 Tinea corporis: Secondary | ICD-10-CM | POA: Diagnosis not present

## 2019-09-22 DIAGNOSIS — L089 Local infection of the skin and subcutaneous tissue, unspecified: Secondary | ICD-10-CM | POA: Diagnosis not present

## 2019-09-22 DIAGNOSIS — L302 Cutaneous autosensitization: Secondary | ICD-10-CM | POA: Diagnosis not present

## 2019-09-22 DIAGNOSIS — L080 Pyoderma: Secondary | ICD-10-CM | POA: Diagnosis not present

## 2019-09-28 ENCOUNTER — Encounter: Payer: Self-pay | Admitting: Family Medicine

## 2019-09-28 ENCOUNTER — Other Ambulatory Visit: Payer: Self-pay

## 2019-09-28 ENCOUNTER — Ambulatory Visit (INDEPENDENT_AMBULATORY_CARE_PROVIDER_SITE_OTHER): Payer: Medicare HMO | Admitting: Family Medicine

## 2019-09-28 VITALS — BP 142/72 | HR 60 | Temp 97.9°F | Ht 70.0 in | Wt 192.0 lb

## 2019-09-28 DIAGNOSIS — I10 Essential (primary) hypertension: Secondary | ICD-10-CM

## 2019-09-28 DIAGNOSIS — N1831 Chronic kidney disease, stage 3a: Secondary | ICD-10-CM | POA: Diagnosis not present

## 2019-09-28 DIAGNOSIS — R634 Abnormal weight loss: Secondary | ICD-10-CM

## 2019-09-28 DIAGNOSIS — E1100 Type 2 diabetes mellitus with hyperosmolarity without nonketotic hyperglycemic-hyperosmolar coma (NKHHC): Secondary | ICD-10-CM | POA: Diagnosis not present

## 2019-09-28 NOTE — Patient Instructions (Signed)

## 2019-09-28 NOTE — Progress Notes (Signed)
Established Patient Office Visit  Subjective:  Patient ID: Jacob Farmer, male    DOB: 1946/07/20  Age: 73 y.o. MRN: [id number]  CC: DM/Nephro-Cr 1.88/GFR 34/H/H-12.6/38.4/HTN-taking amlodipine 10mg  and metoprolol 50mg  BID  HPI [name] presents for  CAD -Acute myocardial infarction treated with TPA in 1990; 1999-stents to circumflex and RCA; residual total occlusion of the left anterior descending; normal ejection fraction; stress nuclear in 2001-distal anteroseptal ischemia; normal LV function -Dr Harl Bowie . Chronic kidney disease, stage 3a, anemia of chronic renal failure, hyperparathyroidism- 12.6/38.4-Dr. Befekadu/Bhutani     taking folic acid daily . First degree heart block  . History of kidney stones  05/23/17- small = no pain- watching  . Hyperlipidemia -atorvastatin daily . Hypertension -pt now taking amlodipine 10mg , and metoprolol 50mg  BID-previously took 5mg  amlodipine Pt taking bp at home with systolic 244/01 . OA (osteoarthritis) -Dr. Bo Merino following, spondylosis cervical and lumbar spinal stenosis-[facility]-Dr. Louanne Skye . Diabetes -A1c 5.5%(09/01/19) glimepiride . Prostate cancer [facility]) urologist-- dr dahlstedt/ oncologist- dr Tammi Klippel  Stage T1c , Gleason 3+4, PSA 4.7, vol 24cc-- radiative seed implants  . Psoriatic arthritis ([place]) - Dr. Bo Merino following . RA (rheumatoid arthritis) (HCC) -Dr. Bo Merino following . S/P drug eluting coronary stent placement  1999-- DES x2 to CFX and RCA  . Sinus bradycardia  Prepyloric ulcer with gastritis, +H pylori- noted on EGD, no h/o colonoscopy, stool cards neg 2019, GERD-protonix started by GI after treatment for H pylori Tinea diagnosis of source of itch-biopsy from the back-pt started on Terbinafine 250mg  for 3 weeks-pt no longer taking atarax as less itching noted with treatment.  -Seen by Derm, f/u in 3 weeks Past Medical History:  Diagnosis Date  . Arteriosclerotic cardiovascular disease (ASCVD)  cardiologist-  dr Roderic Palau branch   Acute myocardial infarction treated with TPA in 1990; 1999-stents to circumflex and RCA; residual total occlusion of the left anterior descending; normal ejection fraction; stress nuclear in 2001-distal anteroseptal ischemia; normal LV function  . Chronic kidney disease, stage 3, mod decreased GFR ([place]) nephrologist-  at [facility] in [place]   Creatinine-2.18 and 12/2007, 1.76 and 12/2008  . First degree heart block   . History of gastric ulcer   . History of kidney stones    05/23/17- small = no pain- watching  . History of MI (myocardial infarction)    1990-  treated w/ TPA  . Hyperlipidemia   . Hypertension   . Myocardial infarction ([place]) 1990  . OA (osteoarthritis)   . Pre-diabetes   . Prediabetes   . Prostate cancer [facility]) urologist-- dr dahlstedt/  oncologist- dr Tammi Klippel   Stage T1c , Gleason 3+4,  PSA 4.7,  vol 24cc--  scheduled for radiative seed implants  . Psoriatic arthritis ([place])   . RA (rheumatoid arthritis) ([place])    rheumologist-  dr Toni Amend  . S/P drug eluting coronary stent placement    1999--  DES x2 to CFX and RCA  . Sinus bradycardia   . Wears dentures    upper and lower partial    Past Surgical History:  Procedure Laterality Date  . BIOPSY  08/27/2019   Procedure: BIOPSY;  Surgeon: Rogene Houston, MD;  [facility];  Service: Endoscopy;;  gastric  . CARDIOVASCULAR STRESS TEST  2001  per Dr Lattie Haw clinic note   distal anteroseptal ischemia,  normal LVF  . COLONOSCOPY  last one 2006 (approx)  . [place]   DES x2  to CFX and RCA/  residual total occlusion LAD,  normal LVEF  . ESOPHAGOGASTRODUODENOSCOPY (EGD) WITH PROPOFOL N/A 08/27/2019   Procedure: ESOPHAGOGASTRODUODENOSCOPY (EGD) WITH PROPOFOL;  Surgeon: Rogene Houston, MD;  [facility];  Service: Endoscopy;  Laterality: N/A;  10:10  . EYE SURGERY Bilateral    cataract  . POSTERIOR CERVICAL  FUSION/FORAMINOTOMY N/A 05/26/2017   Procedure: RIGHT C4-5 FORAMINOTOMY WITH EXCISION OF HERNIATED [place];  Surgeon: Jessy Oto, MD;  Location: [place];  Service: Orthopedics;  Laterality: N/A;  . RADIOACTIVE SEED IMPLANT N/A 01/11/2016   Procedure: RADIOACTIVE SEED IMPLANT/BRACHYTHERAPY IMPLANT;  Surgeon: Franchot Gallo, MD;  Location: [facility];  Service: Urology;  Laterality: N/A;   39  seeds implanted no seeds founds in bladder    Family History  Problem Relation Age of Onset  . Heart attack Mother   . Coronary artery disease Father   . Ulcerative colitis Father   . Ulcers Father   . Breast cancer Sister   . COPD Brother   . Pancreatic cancer Brother   . Healthy Sister   . Diabetes Brother   . Cirrhosis Son   . Seizures Son     Social History   Socioeconomic History  . Marital status: Married    Spouse name: Not on file  . Number of children: 2  . Years of education: Not on file  . Highest education level: Not on file  Occupational History  . Occupation: Retired Arboriculturist  . Financial resource strain: Not on file  . Food insecurity    Worry: Not on file    Inability: Not on file  . Transportation needs    Medical: Not on file    Non-medical: Not on file  Tobacco Use  . Smoking status: Former Smoker    Packs/day: 1.00    Years: 30.00    Pack years: 30.00    Types: Cigarettes    Quit date: 05/21/1989    Years since quitting: 30.3  . Smokeless tobacco: Never Used  Substance and Sexual Activity  . Alcohol use: Yes    Alcohol/week: 20.0 standard drinks    Types: 10 Shots of liquor, 10 Standard drinks or equivalent per week    Comment: 2-3 oz per day  . Drug use: No  . Sexual activity: Not on file  Lifestyle  . Physical activity    Days per week: Not on file    Minutes per session: Not on file  . Stress: Not on file  Relationships  . Social Herbalist on phone: Not on file    Gets together: Not on file    Attends  religious service: Not on file    Active member of club or organization: Not on file    Attends meetings of clubs or organizations: Not on file    Relationship status: Not on file  . Intimate partner violence    Fear of current or ex partner: Not on file    Emotionally abused: Not on file    Physically abused: Not on file    Forced sexual activity: Not on file  Other Topics Concern  . Not on file  Social History Narrative   No regular exercise    No Known Allergies  ROS Review of Systems  Constitutional:       Weight loss-improving with treatment of gerd/ulcers and pain management  HENT: Negative.   Respiratory: Negative.   Cardiovascular:  CAD-MI/stents Primary degree heart block  Gastrointestinal:       Ulcer disease GERD  Endocrine:       Hyperparathryoidism  Genitourinary:       Chronic kidney disease  Skin: Positive for rash.  Allergic/Immunologic: Negative.   Neurological: Negative.   Hematological:       Anemia  Psychiatric/Behavioral: Negative.       Objective:    Physical Exam  Constitutional: He is oriented to person, place, and time. He appears well-developed and well-nourished.  HENT:  Head: Normocephalic and atraumatic.  Eyes: Conjunctivae are normal.  Cardiovascular: Normal rate and regular rhythm.  Pulmonary/Chest: Effort normal and breath sounds normal.  Neurological: He is oriented to person, place, and time.  Psychiatric: He has a normal mood and affect.   Vitals:   09/28/19 1014 09/28/19 1222  BP: (!) 165/78 (!) 142/72  Pulse: 60   Temp: 97.9 F (36.6 C)   Height: 5\' 10"  (1.778 m)   Weight: 192 lb (87.1 kg)   SpO2: 98%   TempSrc: Oral   BMI (Calculated): 27.55   recheck completed 10:30 manual left arm Body mass index is 27.55 kg/m.  Wt Readings from Last 3 Encounters:  09/01/19 192 lb 9.6 oz (87.4 kg)  08/24/19 187 lb (84.8 kg)  08/04/19 187 lb 14.4 oz (85.2 kg)    Health Maintenance Due  Topic Date Due  . Hepatitis  C Screening  1946-06-18  . TETANUS/TDAP  08/14/1963  . COLONOSCOPY  08/13/1994  . PNA vac Low Risk Adult (1 of 2 - PCV13) 08/13/2009  . URINE MICROALBUMIN  04/02/2014  . OPHTHALMOLOGY EXAM  01/28/2019  . INFLUENZA VACCINE  07/17/2019    Lab Results  Component Value Date   WBC 7.1 09/02/2019   HGB 12.6 (L) 09/02/2019   HCT 38.4 (L) 09/02/2019   MCV 87.7 09/02/2019   PLT 177 09/02/2019   Lab Results  Component Value Date   NA 141 09/02/2019   K 4.5 09/02/2019   CO2 28 09/02/2019   GLUCOSE 152 (H) 09/02/2019   BUN 20 09/02/2019   CREATININE 1.88 (H) 09/02/2019   BILITOT 0.5 09/02/2019   ALKPHOS 83 05/26/2017   AST 15 09/02/2019   ALT 16 09/02/2019   PROT 6.2 09/02/2019   ALBUMIN 3.1 (L) 05/26/2017   CALCIUM 9.0 09/02/2019   ANIONGAP 6 08/24/2019   Lab Results  Component Value Date   CHOL 161 01/05/2011   Lab Results  Component Value Date   HDL 36 (L) 01/05/2011   Lab Results  Component Value Date   LDLCALC 81 01/05/2011   Lab Results  Component Value Date   TRIG 219 (H) 01/05/2011   Lab Results  Component Value Date   CHOLHDL 4.5 Ratio 01/05/2011   Lab Results  Component Value Date   HGBA1C 5.5 09/01/2019      Assessment & Plan:  1. Type 2 diabetes mellitus with hyperosmolarity without coma, without long-term current use of  Continue amaryl, recheck A1c 5.5%-f/u 6 months 2. Stage 3a chronic kidney disease No NSAIDs 3. Essential hypertension Amlodipine/metoprolol-pt to continue taking bp at home  4. Abnormal weight loss Improving weight with GERD and ulcer treatment Follow-up: 6 months  LISA Hannah Beat, MD

## 2019-09-29 ENCOUNTER — Telehealth: Payer: Self-pay

## 2019-09-29 DIAGNOSIS — Z1211 Encounter for screening for malignant neoplasm of colon: Secondary | ICD-10-CM

## 2019-09-29 NOTE — Telephone Encounter (Signed)
LeighAnn Kymorah Korf, CMA  

## 2019-10-28 ENCOUNTER — Other Ambulatory Visit: Payer: Self-pay | Admitting: Family Medicine

## 2019-10-28 ENCOUNTER — Telehealth: Payer: Self-pay | Admitting: Family Medicine

## 2019-10-28 DIAGNOSIS — L299 Pruritus, unspecified: Secondary | ICD-10-CM

## 2019-10-28 DIAGNOSIS — I10 Essential (primary) hypertension: Secondary | ICD-10-CM

## 2019-10-28 DIAGNOSIS — M519 Unspecified thoracic, thoracolumbar and lumbosacral intervertebral disc disorder: Secondary | ICD-10-CM

## 2019-10-28 DIAGNOSIS — M19041 Primary osteoarthritis, right hand: Secondary | ICD-10-CM

## 2019-10-28 DIAGNOSIS — M19042 Primary osteoarthritis, left hand: Secondary | ICD-10-CM

## 2019-10-28 DIAGNOSIS — I2510 Atherosclerotic heart disease of native coronary artery without angina pectoris: Secondary | ICD-10-CM

## 2019-10-28 DIAGNOSIS — M5136 Other intervertebral disc degeneration, lumbar region: Secondary | ICD-10-CM

## 2019-10-28 DIAGNOSIS — R634 Abnormal weight loss: Secondary | ICD-10-CM

## 2019-10-28 DIAGNOSIS — M25562 Pain in left knee: Secondary | ICD-10-CM

## 2019-10-28 DIAGNOSIS — N1831 Chronic kidney disease, stage 3a: Secondary | ICD-10-CM

## 2019-10-28 DIAGNOSIS — Z8546 Personal history of malignant neoplasm of prostate: Secondary | ICD-10-CM

## 2019-10-28 NOTE — Telephone Encounter (Signed)
Routing to [employer] Dr Holly Bodily

## 2019-10-28 NOTE — Telephone Encounter (Signed)
Routing to Dr. Holly Bodily & Rosaria Ferries

## 2019-10-28 NOTE — Telephone Encounter (Signed)
Patient came into the office today because he received a bill from his insurance company because he went to [facility] [employer] and they received a bill because they did not have a referral. Patient states he has been going here for years.   Patient also sees [facility], [place], [facility] [place], [facility], [place].   Patient is requesting referrals placed for these offices so they do not continue to get bills from insurance.

## 2019-10-28 NOTE — Telephone Encounter (Signed)
Patient has an appointment with [place] [facility] on 11/03/19 and would like that one placed before then.

## 2019-10-28 NOTE — Telephone Encounter (Signed)
Referrals made to all specialists requested

## 2019-11-02 ENCOUNTER — Encounter: Payer: Self-pay | Admitting: Family Medicine

## 2019-11-03 DIAGNOSIS — L739 Follicular disorder, unspecified: Secondary | ICD-10-CM | POA: Diagnosis not present

## 2019-11-03 DIAGNOSIS — B354 Tinea corporis: Secondary | ICD-10-CM | POA: Diagnosis not present

## 2019-11-03 DIAGNOSIS — B359 Dermatophytosis, unspecified: Secondary | ICD-10-CM | POA: Diagnosis not present

## 2019-11-09 ENCOUNTER — Telehealth: Payer: Self-pay | Admitting: Rheumatology

## 2019-11-09 NOTE — Telephone Encounter (Signed)
Patient left a voicemail requesting a return call to let him know when he is due for labwork.

## 2019-11-09 NOTE — Telephone Encounter (Signed)
Advised patient he is due for labs 12/02/2019 and patient states he will go to [facility] in [place] to have labs drawn. Advised patient to call prior to going so orders can released, patient verbalized understanding.

## 2019-11-26 ENCOUNTER — Telehealth: Payer: Self-pay | Admitting: Rheumatology

## 2019-11-26 NOTE — Telephone Encounter (Signed)
Opened in error

## 2019-11-30 ENCOUNTER — Telehealth: Payer: Self-pay | Admitting: Rheumatology

## 2019-11-30 ENCOUNTER — Telehealth: Payer: Self-pay

## 2019-11-30 DIAGNOSIS — Z79899 Other long term (current) drug therapy: Secondary | ICD-10-CM

## 2019-11-30 DIAGNOSIS — M1A09X0 Idiopathic chronic gout, multiple sites, without tophus (tophi): Secondary | ICD-10-CM

## 2019-11-30 NOTE — Telephone Encounter (Signed)
Patient stated that he has had a cologuard within the last 2 years and his insurance will not pay until after 3 years

## 2019-11-30 NOTE — Telephone Encounter (Signed)
Lab orders released for [facility].  

## 2019-11-30 NOTE — Telephone Encounter (Signed)
Patient called requesting his labwork orders be sent to [facility] in [place].  Patient will be going on Thursday, 12/02/19 in the morning.

## 2019-12-02 DIAGNOSIS — Z79899 Other long term (current) drug therapy: Secondary | ICD-10-CM | POA: Diagnosis not present

## 2019-12-02 DIAGNOSIS — M1A09X0 Idiopathic chronic gout, multiple sites, without tophus (tophi): Secondary | ICD-10-CM | POA: Diagnosis not present

## 2019-12-03 LAB — CBC WITH DIFFERENTIAL/PLATELET
Absolute Monocytes: 903 cells/uL (ref 200–950)
Basophils Absolute: 37 cells/uL (ref 0–200)
Basophils Relative: 0.5 %
Eosinophils Absolute: 710 cells/uL — ABNORMAL HIGH (ref 15–500)
Eosinophils Relative: 9.6 %
HCT: 40.9 % (ref 38.5–50.0)
Hemoglobin: 13.6 g/dL (ref 13.2–17.1)
Lymphs Abs: 992 cells/uL (ref 850–3900)
MCH: 28.4 pg (ref 27.0–33.0)
MCHC: 33.3 g/dL (ref 32.0–36.0)
MCV: 85.4 fL (ref 80.0–100.0)
MPV: 11.4 fL (ref 7.5–12.5)
Monocytes Relative: 12.2 %
Neutro Abs: 4758 cells/uL (ref 1500–7800)
Neutrophils Relative %: 64.3 %
Platelets: 176 10*3/uL (ref 140–400)
RBC: 4.79 10*6/uL (ref 4.20–5.80)
RDW: 12.8 % (ref 11.0–15.0)
Total Lymphocyte: 13.4 %
WBC: 7.4 10*3/uL (ref 3.8–10.8)

## 2019-12-03 LAB — COMPLETE METABOLIC PANEL WITH GFR
AG Ratio: 1.3 (calc) (ref 1.0–2.5)
ALT: 13 U/L (ref 9–46)
AST: 14 U/L (ref 10–35)
Albumin: 3.7 g/dL (ref 3.6–5.1)
Alkaline phosphatase (APISO): 116 U/L (ref 35–144)
BUN/Creatinine Ratio: 12 (calc) (ref 6–22)
BUN: 27 mg/dL — ABNORMAL HIGH (ref 7–25)
CO2: 27 mmol/L (ref 20–32)
Calcium: 9 mg/dL (ref 8.6–10.3)
Chloride: 104 mmol/L (ref 98–110)
Creat: 2.32 mg/dL — ABNORMAL HIGH (ref 0.70–1.18)
GFR, Est African American: 31 mL/min/{1.73_m2} — ABNORMAL LOW (ref >=60)
GFR, Est Non African American: 26 mL/min/{1.73_m2} — ABNORMAL LOW (ref >=60)
Globulin: 2.8 g/dL (calc) (ref 1.9–3.7)
Glucose, Bld: 107 mg/dL (ref 65–139)
Potassium: 4.3 mmol/L (ref 3.5–5.3)
Sodium: 139 mmol/L (ref 135–146)
Total Bilirubin: 0.6 mg/dL (ref 0.2–1.2)
Total Protein: 6.5 g/dL (ref 6.1–8.1)

## 2019-12-03 LAB — URIC ACID: Uric Acid, Serum: 5.9 mg/dL (ref 4.0–8.0)

## 2019-12-03 NOTE — Telephone Encounter (Signed)
Uric acid is in desired range.  Elevation in creatinine noted.  All other labs are stable.  Please forward labs to his nephrologist and notify patient.

## 2019-12-14 ENCOUNTER — Other Ambulatory Visit (INDEPENDENT_AMBULATORY_CARE_PROVIDER_SITE_OTHER): Payer: Self-pay | Admitting: *Deleted

## 2019-12-14 ENCOUNTER — Telehealth: Payer: Self-pay | Admitting: Family Medicine

## 2019-12-14 ENCOUNTER — Encounter (INDEPENDENT_AMBULATORY_CARE_PROVIDER_SITE_OTHER): Payer: Self-pay | Admitting: Internal Medicine

## 2019-12-14 ENCOUNTER — Other Ambulatory Visit: Payer: Self-pay

## 2019-12-14 ENCOUNTER — Ambulatory Visit (INDEPENDENT_AMBULATORY_CARE_PROVIDER_SITE_OTHER): Payer: Medicare HMO | Admitting: Internal Medicine

## 2019-12-14 DIAGNOSIS — R634 Abnormal weight loss: Secondary | ICD-10-CM

## 2019-12-14 DIAGNOSIS — Z8719 Personal history of other diseases of the digestive system: Secondary | ICD-10-CM

## 2019-12-14 DIAGNOSIS — Z8711 Personal history of peptic ulcer disease: Secondary | ICD-10-CM

## 2019-12-14 MED ORDER — PANTOPRAZOLE SODIUM 40 MG PO TBEC: 40.0000 mg | DELAYED_RELEASE_TABLET | Freq: Every day | ORAL | 1 refills | Status: DC

## 2019-12-14 NOTE — Progress Notes (Signed)
Virtual Visit via Telephone Note  Patient had scheduled face-to-face visit today.  Visit was changed to virtual/telephone visit because of ongoing Covid-19 pandemic and we both agreed. I connected with Jacob Farmer on 12/14/19 at  1:30 PM EST by telephone and verified that I am speaking with the correct person using two identifiers.  Location: Patient: home Provider: office   I discussed the limitations, risks, security and privacy concerns of performing an evaluation and management service by telephone and the availability of in person appointments. I also discussed with the patient that there may be a patient responsible charge related to this service. The patient expressed understanding and agreed to proceed.   History of Present Illness:  Patient is 73 year old male who was seen in the office on 07/29/2019 for early satiety anorexia 11 pound weight loss occurring appeared of 2 months.  He did not have nausea vomiting or abdominal pain.  CT revealed duodenal wall thickening but no pancreatic mass or other abnormalities.  He underwent esophagogastroduodenoscopy on 08/27/2019.  EGD revealed gastritis and 4 mm superficial antral ulcer which appeared to be healing.  Biopsy from the sulfas revealed benign etiology and H. pylori stains were positive.  Patient was maintained on pantoprazole 40 mg twice daily and he was given Pylera for 10 days.  Patient states he is feeling much better.  His appetite is back.  He states his weight has jumped to 200 pounds.  Which means he has gained 11 pounds since his last visit.  He was able to finish Pylera without any side effects.  He has good appetite.  He denies early satiety nausea or vomiting or abdominal pain.  Bowels move daily.  He denies melena or rectal bleeding.  He is not having any side effects with pantoprazole. He does complain of itching for which she is planning to see dermatologist tomorrow.  He states he has had itching for over a year and a  half.    Observations/Objective:  Patient reported his weight to be 200 pounds. He weighed 187 pounds on 07/29/2019 visit.  Assessment and Plan:  #1.  Gastric ulcer.  Gastric ulcer was diagnosed on EGD of 08/27/2019.  He was also found to have H. pylori infection for which she took Pylera.  Since he has a remote history of peptic ulcer disease should document eradication of infection otherwise also would be back.  Given there was a small superficial ulcer and biopsy was negative I do not feel that he necessarily needs to have a follow-up exam unless symptoms relapse on PPI dose reduction.  No contraindication to use of low-dose PPI but he should refrain from taking other OTC NSAIDs.  #2.  Weight loss.  Weight loss has reversed since therapy for peptic ulcer disease.  #3.  Elevated serum creatinine of 2.32 on 12/02/2019.  He has history of chronic kidney disease but his creatinine has always been below 2.  There is remote possibility of kidney injury due to PPI.  Follow Up Instructions:  Decrease pantoprazole to 40 mg by mouth 30 minutes before breakfast daily. Patient will go to the lab for stool H. pylori antigen and metabolic 7. Patient advised to call office if symptoms recur on PPI dose reduction.  Office visit on as-needed basis. I discussed the assessment and treatment plan with the patient. The patient was provided an opportunity to ask questions and all were answered. The patient agreed with the plan and demonstrated an understanding of the instructions.   The patient  was advised to call back or seek an in-person evaluation if the symptoms worsen or if the condition fails to improve as anticipated.  I provided 11 minutes of non-face-to-face time during this encounter.   Hildred Laser, MD

## 2019-12-14 NOTE — Patient Instructions (Signed)
Patient have metabolic 7 and stool H. pylori antigen in 2 to 3 weeks.

## 2019-12-14 NOTE — Telephone Encounter (Signed)
NOTED

## 2019-12-14 NOTE — Telephone Encounter (Signed)
[employer] is calling in regards to the patient cologuard order and states it is missing the provider signature and the insurance ID number. States the order is needing to be replaced.

## 2019-12-15 DIAGNOSIS — L299 Pruritus, unspecified: Secondary | ICD-10-CM | POA: Diagnosis not present

## 2019-12-15 DIAGNOSIS — L309 Dermatitis, unspecified: Secondary | ICD-10-CM | POA: Diagnosis not present

## 2019-12-28 DIAGNOSIS — R634 Abnormal weight loss: Secondary | ICD-10-CM | POA: Diagnosis not present

## 2019-12-28 DIAGNOSIS — Z8719 Personal history of other diseases of the digestive system: Secondary | ICD-10-CM | POA: Diagnosis not present

## 2019-12-29 LAB — HELICOBACTER PYLORI  SPECIAL ANTIGEN
MICRO NUMBER:: 10035502
SPECIMEN QUALITY: ADEQUATE

## 2019-12-29 LAB — BASIC METABOLIC PANEL
BUN/Creatinine Ratio: 11 (calc) (ref 6–22)
BUN: 28 mg/dL — ABNORMAL HIGH (ref 7–25)
CO2: 29 mmol/L (ref 20–32)
Calcium: 8.8 mg/dL (ref 8.6–10.3)
Chloride: 102 mmol/L (ref 98–110)
Creat: 2.56 mg/dL — ABNORMAL HIGH (ref 0.70–1.18)
Glucose, Bld: 196 mg/dL — ABNORMAL HIGH (ref 65–139)
Potassium: 4.5 mmol/L (ref 3.5–5.3)
Sodium: 139 mmol/L (ref 135–146)

## 2019-12-30 NOTE — Progress Notes (Signed)
Virtual Visit via Telephone Note  I connected with [name] on 01/03/20 at  9:45 AM EST by telephone and verified that I am speaking with the correct person using two identifiers.  Location: Patient: Home  Provider: Clinic  This service was conducted via virtual visit.  Both audio and visual tools were used.  The patient was located at home. I was located in my office.  Consent was obtained prior to the virtual visit and is aware of possible charges through their insurance for this visit.  The patient is an established patient.  Dr. Estanislado Pandy, MD conducted the virtual visit and Hazel Sams, PA-C acted as scribe during the service.  Office staff helped with scheduling follow up visits after the service was conducted.     I discussed the limitations, risks, security and privacy concerns of performing an evaluation and management service by telephone and the availability of in person appointments. I also discussed with the patient that there may be a patient responsible charge related to this service. The patient expressed understanding and agreed to proceed.  CC: Medication monitoring  History of Present Illness: Patient is a 74 year old male with a past medical history of rheumatoid arthritis and gout.  He is taking Arava 10 mg 1 tablet by mouth daily for the management of rheumatoid arthritis. He denies any recent RA flares.  He denies any joint pain or inflammation at this time.  He denies any gout flares. He takes allopurinol 100 mg 1/2 tablet by mouth daily for management of gout. He is followed by his nephrologist, Dr. Theador Hawthorne.   Review of Systems  Constitutional: Negative for fever and malaise/fatigue.  HENT: Negative for congestion.   Eyes: Negative for photophobia, pain, discharge and redness.  Respiratory: Negative for cough, shortness of breath and wheezing.   Cardiovascular: Negative for chest pain, palpitations and leg swelling.  Gastrointestinal: Negative for blood in stool,  constipation and diarrhea.  Genitourinary: Negative for dysuria and frequency.  Musculoskeletal: Negative for back pain, joint pain, myalgias and neck pain.  Skin: Positive for itching. Negative for rash.  Neurological: Negative for dizziness, weakness and headaches.  Endo/Heme/Allergies: Does not bruise/bleed easily.  Psychiatric/Behavioral: Negative for depression and memory loss. The patient is not nervous/anxious and does not have insomnia.       Observations/Objective: Physical Exam  Constitutional: He is oriented to person, place, and time.  Neurological: He is alert and oriented to person, place, and time.  Psychiatric: Mood, memory, affect and judgment normal.   Patient reports morning stiffness for 0 NONE.   Patient denies nocturnal pain.  Difficulty dressing/grooming: Denies Difficulty climbing stairs: Denies Difficulty getting out of chair: Denies Difficulty using hands for taps, buttons, cutlery, and/or writing: Denies   Assessment and Plan: Visit Diagnoses: Seronegative rheumatoid arthritis ([place]) -He has not had any recent rheumatoid arthritis flares.  He is not having any joint pain or inflammation at this time.  He is clinically doing well on Arava 10 mg 1 tablet daily.  He has not been experiencing any morning stiffness or nocturnal pain.  He will continue taking arava as prescribed.  A refill will be sent to the pharmacy.  He was advised to notify us if develops increased joint pain or joint swelling.  He will follow up in 3-4 months.   High risk medication use - Arava 10 mg 1 tablet by mouth daily.  CBC and CMP were drawn on 12/02/19.  He had a recent BMP on 12/28/19.  He was advised to follow up with his nephrologist to discuss recent lab work.  GFR continues to trend down.  Patient was advised to call Dr. Theador Hawthorne today for the appointment.  Idiopathic chronic gout of multiple sites without tophus - He has not had any recent gout flares.  He continues to take  allopurinol on 100 mg half tablet by mouth daily.  Uric acid was 5.9 on 12/02/19.  He was advised to notify us if he develops signs or symptoms of a gout flare.  Primary osteoarthritis of both hands - He is not having any discomfort or inflammation in his hands at this time.  DDD (degenerative disc disease), lumbar -He has no lower back pain at this time. No symptoms of radiculopathy.   Other medical conditions are listed as follows:   Rash and other nonspecific skin eruption - Macular rash noted on bilateral lower extremities. He noticed this rash start 1 year ago.   History of prostate cancer   History of gastroesophageal reflux (GERD)   History of coronary artery disease   History of renal insufficiency syndrome - He was advised to schedule an appointment with Dr. Hurley Cisco.   History of hypertension   History of hypercholesterolemia   Follow Up Instructions: He will follow up in 3-4 months.    I discussed the assessment and treatment plan with the patient. The patient was provided an opportunity to ask questions and all were answered. The patient agreed with the plan and demonstrated an understanding of the instructions.   The patient was advised to call back or seek an in-person evaluation if the symptoms worsen or if the condition fails to improve as anticipated.  I provided 25 minutes of non-face-to-face time during this encounter.   Bo Merino, MD    Scribed by-  Hazel Sams, PA-C

## 2020-01-03 ENCOUNTER — Telehealth (INDEPENDENT_AMBULATORY_CARE_PROVIDER_SITE_OTHER): Payer: Medicare HMO | Admitting: Rheumatology

## 2020-01-03 ENCOUNTER — Encounter: Payer: Self-pay | Admitting: Rheumatology

## 2020-01-03 ENCOUNTER — Other Ambulatory Visit (HOSPITAL_COMMUNITY): Payer: Self-pay | Admitting: Nephrology

## 2020-01-03 ENCOUNTER — Other Ambulatory Visit: Payer: Self-pay | Admitting: Nephrology

## 2020-01-03 ENCOUNTER — Other Ambulatory Visit: Payer: Self-pay

## 2020-01-03 ENCOUNTER — Other Ambulatory Visit: Payer: Self-pay | Admitting: *Deleted

## 2020-01-03 DIAGNOSIS — M0600 Rheumatoid arthritis without rheumatoid factor, unspecified site: Secondary | ICD-10-CM

## 2020-01-03 DIAGNOSIS — Z8679 Personal history of other diseases of the circulatory system: Secondary | ICD-10-CM | POA: Diagnosis not present

## 2020-01-03 DIAGNOSIS — N179 Acute kidney failure, unspecified: Secondary | ICD-10-CM

## 2020-01-03 DIAGNOSIS — M1A09X0 Idiopathic chronic gout, multiple sites, without tophus (tophi): Secondary | ICD-10-CM | POA: Diagnosis not present

## 2020-01-03 DIAGNOSIS — Z8639 Personal history of other endocrine, nutritional and metabolic disease: Secondary | ICD-10-CM

## 2020-01-03 DIAGNOSIS — M19041 Primary osteoarthritis, right hand: Secondary | ICD-10-CM | POA: Diagnosis not present

## 2020-01-03 DIAGNOSIS — E211 Secondary hyperparathyroidism, not elsewhere classified: Secondary | ICD-10-CM | POA: Diagnosis not present

## 2020-01-03 DIAGNOSIS — N184 Chronic kidney disease, stage 4 (severe): Secondary | ICD-10-CM | POA: Diagnosis not present

## 2020-01-03 DIAGNOSIS — Z8719 Personal history of other diseases of the digestive system: Secondary | ICD-10-CM

## 2020-01-03 DIAGNOSIS — R809 Proteinuria, unspecified: Secondary | ICD-10-CM | POA: Diagnosis not present

## 2020-01-03 DIAGNOSIS — R21 Rash and other nonspecific skin eruption: Secondary | ICD-10-CM

## 2020-01-03 DIAGNOSIS — Z79899 Other long term (current) drug therapy: Secondary | ICD-10-CM

## 2020-01-03 DIAGNOSIS — D631 Anemia in chronic kidney disease: Secondary | ICD-10-CM | POA: Diagnosis not present

## 2020-01-03 DIAGNOSIS — N170 Acute kidney failure with tubular necrosis: Secondary | ICD-10-CM | POA: Diagnosis not present

## 2020-01-03 DIAGNOSIS — N189 Chronic kidney disease, unspecified: Secondary | ICD-10-CM | POA: Diagnosis not present

## 2020-01-03 DIAGNOSIS — M5136 Other intervertebral disc degeneration, lumbar region: Secondary | ICD-10-CM

## 2020-01-03 DIAGNOSIS — Z8546 Personal history of malignant neoplasm of prostate: Secondary | ICD-10-CM

## 2020-01-03 DIAGNOSIS — M19042 Primary osteoarthritis, left hand: Secondary | ICD-10-CM

## 2020-01-03 DIAGNOSIS — Z87448 Personal history of other diseases of urinary system: Secondary | ICD-10-CM

## 2020-01-03 MED ORDER — LEFLUNOMIDE 10 MG PO TABS: 10.0000 mg | ORAL_TABLET | Freq: Every day | ORAL | 0 refills | Status: DC

## 2020-01-03 MED ORDER — ALLOPURINOL 100 MG PO TABS: ORAL_TABLET | ORAL | 0 refills | Status: DC

## 2020-01-05 ENCOUNTER — Ambulatory Visit: Payer: Medicare HMO | Admitting: Rheumatology

## 2020-01-05 DIAGNOSIS — R809 Proteinuria, unspecified: Secondary | ICD-10-CM | POA: Diagnosis not present

## 2020-01-05 DIAGNOSIS — N184 Chronic kidney disease, stage 4 (severe): Secondary | ICD-10-CM | POA: Diagnosis not present

## 2020-01-05 DIAGNOSIS — D631 Anemia in chronic kidney disease: Secondary | ICD-10-CM | POA: Diagnosis not present

## 2020-01-05 DIAGNOSIS — N189 Chronic kidney disease, unspecified: Secondary | ICD-10-CM | POA: Diagnosis not present

## 2020-01-05 DIAGNOSIS — Z79899 Other long term (current) drug therapy: Secondary | ICD-10-CM | POA: Diagnosis not present

## 2020-01-05 DIAGNOSIS — N170 Acute kidney failure with tubular necrosis: Secondary | ICD-10-CM | POA: Diagnosis not present

## 2020-01-05 DIAGNOSIS — E211 Secondary hyperparathyroidism, not elsewhere classified: Secondary | ICD-10-CM | POA: Diagnosis not present

## 2020-01-07 ENCOUNTER — Ambulatory Visit (HOSPITAL_COMMUNITY)
Admission: RE | Admit: 2020-01-07 | Discharge: 2020-01-07 | Disposition: A | Discharge status: Home / Self Care | Payer: Medicare HMO | Source: Ambulatory Visit | Attending: Nephrology | Admitting: Nephrology

## 2020-01-07 ENCOUNTER — Other Ambulatory Visit: Payer: Self-pay

## 2020-01-07 DIAGNOSIS — N179 Acute kidney failure, unspecified: Secondary | ICD-10-CM | POA: Diagnosis not present

## 2020-01-11 ENCOUNTER — Other Ambulatory Visit: Payer: Self-pay | Admitting: Family Medicine

## 2020-01-17 ENCOUNTER — Telehealth: Payer: Self-pay | Admitting: Cardiology

## 2020-01-17 ENCOUNTER — Encounter: Payer: Self-pay | Admitting: Cardiology

## 2020-01-17 ENCOUNTER — Other Ambulatory Visit: Payer: Self-pay

## 2020-01-17 ENCOUNTER — Ambulatory Visit: Payer: Medicare HMO | Admitting: Cardiology

## 2020-01-17 VITALS — BP 177/81 | HR 73 | Temp 97.3°F | Ht 70.0 in | Wt 195.0 lb

## 2020-01-17 DIAGNOSIS — I2510 Atherosclerotic heart disease of native coronary artery without angina pectoris: Secondary | ICD-10-CM | POA: Diagnosis not present

## 2020-01-17 DIAGNOSIS — E782 Mixed hyperlipidemia: Secondary | ICD-10-CM

## 2020-01-17 DIAGNOSIS — I10 Essential (primary) hypertension: Secondary | ICD-10-CM

## 2020-01-17 NOTE — Telephone Encounter (Signed)
Dr.Branch is going to put patient on call list for [place] [facility]

## 2020-01-17 NOTE — Patient Instructions (Signed)

## 2020-01-17 NOTE — Progress Notes (Signed)
Clinical Summary Jacob Farmer is a 74 y.o.male seen today for follow up of the following medical problems.   1. CAD - multiple interventions as described below - normal LV function per prior clinic notes, there is no documented study in our sytem  - no recent chest pain - no SOB/DOe - compliant with meds   2. HTN  - started on hydralazine by nephorlogy - followed by Dr Theador Hawthorne, has follow this week - home bp's typically around 140s/70s.   3. Hyperlipidemia - compliant with statin, labs followed by pcp  - 06/2019 TC 129 TG 140 HDL 31 LDL 76   4. CKDIII - followed by renal Dr Theador Hawthorne  5. Prostate CA - had radioactive seeds placed, followed by urology.   6. Rash - unclear etiology. pcp had him hold his allopurinol and also arava. Around the time of the rash we had increased his norvasc to 10mg  daily, lowered back to prior dose of 5mg  daily - followed by dermatology  7. Rheumatoid arthritis - followed by rheum    Past Medical History:  Diagnosis Date  . Arteriosclerotic cardiovascular disease (ASCVD) cardiologist-  dr Roderic Palau Nikky Duba   Acute myocardial infarction treated with TPA in 1990; 1999-stents to circumflex and RCA; residual total occlusion of the left anterior descending; normal ejection fraction; stress nuclear in 2001-distal anteroseptal ischemia; normal LV function  . Chronic kidney disease, stage 3, mod decreased GFR nephrologist-  at [facility] in [place]   Creatinine-2.18 and 12/2007, 1.76 and 12/2008  . First degree heart block   . History of gastric ulcer   . History of kidney stones    05/23/17- small = no pain- watching  . History of MI (myocardial infarction)    1990-  treated w/ TPA  . Hyperlipidemia   . Hypertension   . Myocardial infarction ([place]) 1990  . OA (osteoarthritis)   . Pre-diabetes   . Prediabetes   . Prostate cancer [facility]) urologist-- dr dahlstedt/  oncologist- dr Tammi Klippel   Stage T1c , Gleason 3+4,  PSA 4.7,   vol 24cc--  scheduled for radiative seed implants  . Psoriatic arthritis ([place])   . RA (rheumatoid arthritis) ([place])    rheumologist-  dr Toni Amend  . S/P drug eluting coronary stent placement    1999--  DES x2 to CFX and RCA  . Sinus bradycardia   . Wears dentures    upper and lower partial     No Known Allergies   Current Outpatient Medications  Medication Sig Dispense Refill  . allopurinol (ZYLOPRIM) 100 MG tablet TAKE 1/2 TABLET (50 MG TOTAL) BY MOUTH DAILY. 45 tablet 0  . amLODipine (NORVASC) 10 MG tablet TAKE 1 TABLET EVERY DAY 90 tablet 1  . aspirin 81 MG tablet Take 1 tablet (81 mg total) by mouth 3 (three) times a week. 30 tablet   . atorvastatin (LIPITOR) 80 MG tablet Take 1 tablet (80 mg total) by mouth daily. (Patient taking differently: Take 80 mg by mouth every evening. ) 90 tablet 3  . folic acid (FOLVITE) 062 MCG tablet Take 400 mcg by mouth daily.    Marland Kitchen glimepiride (AMARYL) 1 MG tablet Take 1 mg by mouth daily before breakfast.    . hydrALAZINE (APRESOLINE) 25 MG tablet Take by mouth.    . leflunomide (ARAVA) 10 MG tablet Take 1 tablet (10 mg total) by mouth daily. 90 tablet 0  . metoprolol (LOPRESSOR) 50 MG tablet TAKE ONE TABLET BY MOUTH TWICE DAILY (Patient  taking differently: Take 50 mg by mouth 2 (two) times daily. ) 60 tablet 6  . nitroGLYCERIN (NITROSTAT) 0.4 MG SL tablet Place 0.4 mg under the tongue every 5 (five) minutes as needed for chest pain.     . pantoprazole (PROTONIX) 40 MG tablet Take 1 tablet (40 mg total) by mouth daily with breakfast. 90 tablet 1  . tacrolimus (PROTOPIC) 0.1 % ointment     . triamcinolone cream (KENALOG) 0.1 % Apply 1 application topically 3 (three) times daily as needed (itching).      No current facility-administered medications for this visit.     Past Surgical History:  Procedure Laterality Date  . BIOPSY  08/27/2019   Procedure: BIOPSY;  Surgeon: Rogene Houston, MD;  [facility];  Service: Endoscopy;;   gastric  . CARDIOVASCULAR STRESS TEST  2001  per Dr Lattie Haw clinic note   distal anteroseptal ischemia,  normal LVF  . COLONOSCOPY  last one 2006 (approx)  . CORONARY ANGIOPLASTY WITH STENT PLACEMENT  1999   DES x2  to CFX and RCA/  residual total occlusion LAD,  normal LVEF  . ESOPHAGOGASTRODUODENOSCOPY (EGD) WITH PROPOFOL N/A 08/27/2019   Procedure: ESOPHAGOGASTRODUODENOSCOPY (EGD) WITH PROPOFOL;  Surgeon: Rogene Houston, MD;  [facility];  Service: Endoscopy;  Laterality: N/A;  10:10  . EYE SURGERY Bilateral    cataract  . POSTERIOR CERVICAL FUSION/FORAMINOTOMY N/A 05/26/2017   Procedure: RIGHT C4-5 FORAMINOTOMY WITH EXCISION OF HERNIATED [place];  Surgeon: Jessy Oto, MD;  Location: [place];  Service: Orthopedics;  Laterality: N/A;  . RADIOACTIVE SEED IMPLANT N/A 01/11/2016   Procedure: RADIOACTIVE SEED IMPLANT/BRACHYTHERAPY IMPLANT;  Surgeon: Franchot Gallo, MD;  Location: [facility];  Service: Urology;  Laterality: N/A;   71  seeds implanted no seeds founds in bladder     No Known Allergies    Family History  Problem Relation Age of Onset  . Heart attack Mother   . Coronary artery disease Father   . Ulcerative colitis Father   . Ulcers Father   . Breast cancer Sister   . COPD Brother   . Pancreatic cancer Brother   . Healthy Sister   . Diabetes Brother   . Cirrhosis Son   . Seizures Son      Social History Jacob Farmer reports that he quit smoking about 30 years ago. His smoking use included cigarettes. He has a 30.00 pack-year smoking history. He has never used smokeless tobacco. Jacob Farmer reports current alcohol use of about 20.0 standard drinks of alcohol per week.   Review of Systems CONSTITUTIONAL: No weight loss, fever, chills, weakness or fatigue.  HEENT: Eyes: No visual loss, blurred vision, double vision or yellow sclerae.No hearing loss, sneezing, congestion, runny nose or sore throat.  SKIN: No rash or itching.   CARDIOVASCULAR: pe rhpi RESPIRATORY: No shortness of breath, cough or sputum.  GASTROINTESTINAL: No anorexia, nausea, vomiting or diarrhea. No abdominal pain or blood.  GENITOURINARY: No burning on urination, no polyuria NEUROLOGICAL: No headache, dizziness, syncope, paralysis, ataxia, numbness or tingling in the extremities. No change in bowel or bladder control.  MUSCULOSKELETAL: No muscle, back pain, joint pain or stiffness.  LYMPHATICS: No enlarged nodes. No history of splenectomy.  PSYCHIATRIC: No history of depression or anxiety.  ENDOCRINOLOGIC: No reports of sweating, cold or heat intolerance. No polyuria or polydipsia.  Marland Kitchen   Physical Examination Vitals:   01/17/20 0807  BP: (!) 177/81  Pulse: 73  Temp: Marland Kitchen)  97.3 F (36.3 C)  SpO2: 99%   There were no vitals filed for this visit.  Gen: resting comfortably, no acute distress HEENT: no scleral icterus, pupils equal round and reactive, no palptable cervical adenopathy,  CV: RRR, no m/r/g, no jvd Resp: Clear to auscultation bilaterally GI: abdomen is soft, non-tender, non-distended, normal bowel sounds, no hepatosplenomegaly MSK: extremities are warm, no edema.  Skin: warm, no rash Neuro:  no focal deficits Psych: appropriate affect   Diagnostic Studies     Assessment and Plan   1. CAD - no recent symptoms, he will continue current meds  2. HTN - home bp's tend to run better than clinic numbers - recently started on hydralazine by nephrology with f/u later this week, defer further changes to nephrology so as not to complicate management.   3. Hyperlipidemia - essentially at goal, conitnue statin   F/u 6 months     Arnoldo Lenis, M.D.

## 2020-01-17 NOTE — Telephone Encounter (Signed)
Pt left voice mail requesting someone please give him a call @ [phone]

## 2020-01-20 DIAGNOSIS — N179 Acute kidney failure, unspecified: Secondary | ICD-10-CM | POA: Diagnosis not present

## 2020-01-21 DIAGNOSIS — R809 Proteinuria, unspecified: Secondary | ICD-10-CM | POA: Diagnosis not present

## 2020-01-21 DIAGNOSIS — N170 Acute kidney failure with tubular necrosis: Secondary | ICD-10-CM | POA: Diagnosis not present

## 2020-01-21 DIAGNOSIS — E211 Secondary hyperparathyroidism, not elsewhere classified: Secondary | ICD-10-CM | POA: Diagnosis not present

## 2020-01-21 DIAGNOSIS — N184 Chronic kidney disease, stage 4 (severe): Secondary | ICD-10-CM | POA: Diagnosis not present

## 2020-01-21 DIAGNOSIS — E559 Vitamin D deficiency, unspecified: Secondary | ICD-10-CM | POA: Diagnosis not present

## 2020-02-02 DIAGNOSIS — N184 Chronic kidney disease, stage 4 (severe): Secondary | ICD-10-CM | POA: Diagnosis not present

## 2020-02-02 DIAGNOSIS — N170 Acute kidney failure with tubular necrosis: Secondary | ICD-10-CM | POA: Diagnosis not present

## 2020-02-02 DIAGNOSIS — E559 Vitamin D deficiency, unspecified: Secondary | ICD-10-CM | POA: Diagnosis not present

## 2020-02-02 DIAGNOSIS — R809 Proteinuria, unspecified: Secondary | ICD-10-CM | POA: Diagnosis not present

## 2020-02-02 DIAGNOSIS — E211 Secondary hyperparathyroidism, not elsewhere classified: Secondary | ICD-10-CM | POA: Diagnosis not present

## 2020-02-04 DIAGNOSIS — R809 Proteinuria, unspecified: Secondary | ICD-10-CM | POA: Diagnosis not present

## 2020-02-04 DIAGNOSIS — E211 Secondary hyperparathyroidism, not elsewhere classified: Secondary | ICD-10-CM | POA: Diagnosis not present

## 2020-02-04 DIAGNOSIS — N189 Chronic kidney disease, unspecified: Secondary | ICD-10-CM | POA: Diagnosis not present

## 2020-02-04 DIAGNOSIS — N170 Acute kidney failure with tubular necrosis: Secondary | ICD-10-CM | POA: Diagnosis not present

## 2020-02-04 DIAGNOSIS — E559 Vitamin D deficiency, unspecified: Secondary | ICD-10-CM | POA: Diagnosis not present

## 2020-02-04 DIAGNOSIS — Z79899 Other long term (current) drug therapy: Secondary | ICD-10-CM | POA: Diagnosis not present

## 2020-02-04 DIAGNOSIS — N184 Chronic kidney disease, stage 4 (severe): Secondary | ICD-10-CM | POA: Diagnosis not present

## 2020-02-04 DIAGNOSIS — D631 Anemia in chronic kidney disease: Secondary | ICD-10-CM | POA: Diagnosis not present

## 2020-02-28 ENCOUNTER — Telehealth: Payer: Self-pay | Admitting: Rheumatology

## 2020-02-28 DIAGNOSIS — Z79899 Other long term (current) drug therapy: Secondary | ICD-10-CM

## 2020-02-28 NOTE — Telephone Encounter (Signed)
Lab Orders released.  

## 2020-02-28 NOTE — Telephone Encounter (Signed)
[name] called requesting labwork orders for Jacob Farmer be sent to [facility] in [place].  [name] states Jacob Farmer will be going on Wednesday, 03/02/19.

## 2020-03-01 DIAGNOSIS — Z79899 Other long term (current) drug therapy: Secondary | ICD-10-CM | POA: Diagnosis not present

## 2020-03-02 LAB — COMPLETE METABOLIC PANEL WITH GFR
AG Ratio: 1.4 (calc) (ref 1.0–2.5)
ALT: 18 U/L (ref 9–46)
AST: 20 U/L (ref 10–35)
Albumin: 3.8 g/dL (ref 3.6–5.1)
Alkaline phosphatase (APISO): 122 U/L (ref 35–144)
BUN/Creatinine Ratio: 9 (calc) (ref 6–22)
BUN: 19 mg/dL (ref 7–25)
CO2: 26 mmol/L (ref 20–32)
Calcium: 8.9 mg/dL (ref 8.6–10.3)
Chloride: 105 mmol/L (ref 98–110)
Creat: 2.09 mg/dL — ABNORMAL HIGH (ref 0.70–1.18)
GFR, Est African American: 35 mL/min/{1.73_m2} — ABNORMAL LOW (ref >=60)
GFR, Est Non African American: 30 mL/min/{1.73_m2} — ABNORMAL LOW (ref >=60)
Globulin: 2.8 g/dL (calc) (ref 1.9–3.7)
Glucose, Bld: 68 mg/dL (ref 65–99)
Potassium: 4.8 mmol/L (ref 3.5–5.3)
Sodium: 140 mmol/L (ref 135–146)
Total Bilirubin: 0.4 mg/dL (ref 0.2–1.2)
Total Protein: 6.6 g/dL (ref 6.1–8.1)

## 2020-03-02 LAB — CBC WITH DIFFERENTIAL/PLATELET
Absolute Monocytes: 943 cells/uL (ref 200–950)
Basophils Absolute: 58 cells/uL (ref 0–200)
Basophils Relative: 0.8 %
Eosinophils Absolute: 497 cells/uL (ref 15–500)
Eosinophils Relative: 6.9 %
HCT: 40.8 % (ref 38.5–50.0)
Hemoglobin: 13 g/dL — ABNORMAL LOW (ref 13.2–17.1)
Lymphs Abs: 1202 cells/uL (ref 850–3900)
MCH: 27.5 pg (ref 27.0–33.0)
MCHC: 31.9 g/dL — ABNORMAL LOW (ref 32.0–36.0)
MCV: 86.3 fL (ref 80.0–100.0)
MPV: 11.2 fL (ref 7.5–12.5)
Monocytes Relative: 13.1 %
Neutro Abs: 4500 cells/uL (ref 1500–7800)
Neutrophils Relative %: 62.5 %
Platelets: 249 10*3/uL (ref 140–400)
RBC: 4.73 10*6/uL (ref 4.20–5.80)
RDW: 13 % (ref 11.0–15.0)
Total Lymphocyte: 16.7 %
WBC: 7.2 10*3/uL (ref 3.8–10.8)

## 2020-03-02 NOTE — Telephone Encounter (Signed)
CBC stable.  GFR is low and is stable

## 2020-03-06 DIAGNOSIS — N184 Chronic kidney disease, stage 4 (severe): Secondary | ICD-10-CM | POA: Diagnosis not present

## 2020-03-06 DIAGNOSIS — Z79899 Other long term (current) drug therapy: Secondary | ICD-10-CM | POA: Diagnosis not present

## 2020-03-06 DIAGNOSIS — R809 Proteinuria, unspecified: Secondary | ICD-10-CM | POA: Diagnosis not present

## 2020-03-06 DIAGNOSIS — D631 Anemia in chronic kidney disease: Secondary | ICD-10-CM | POA: Diagnosis not present

## 2020-03-06 DIAGNOSIS — E559 Vitamin D deficiency, unspecified: Secondary | ICD-10-CM | POA: Diagnosis not present

## 2020-03-06 DIAGNOSIS — N170 Acute kidney failure with tubular necrosis: Secondary | ICD-10-CM | POA: Diagnosis not present

## 2020-03-06 DIAGNOSIS — N189 Chronic kidney disease, unspecified: Secondary | ICD-10-CM | POA: Diagnosis not present

## 2020-03-06 DIAGNOSIS — E211 Secondary hyperparathyroidism, not elsewhere classified: Secondary | ICD-10-CM | POA: Diagnosis not present

## 2020-03-09 ENCOUNTER — Telehealth: Payer: Self-pay | Admitting: *Deleted

## 2020-03-09 DIAGNOSIS — N170 Acute kidney failure with tubular necrosis: Secondary | ICD-10-CM | POA: Diagnosis not present

## 2020-03-09 DIAGNOSIS — I129 Hypertensive chronic kidney disease with stage 1 through stage 4 chronic kidney disease, or unspecified chronic kidney disease: Secondary | ICD-10-CM | POA: Diagnosis not present

## 2020-03-09 DIAGNOSIS — E559 Vitamin D deficiency, unspecified: Secondary | ICD-10-CM | POA: Diagnosis not present

## 2020-03-09 DIAGNOSIS — R809 Proteinuria, unspecified: Secondary | ICD-10-CM | POA: Diagnosis not present

## 2020-03-09 DIAGNOSIS — N189 Chronic kidney disease, unspecified: Secondary | ICD-10-CM | POA: Diagnosis not present

## 2020-03-09 DIAGNOSIS — E211 Secondary hyperparathyroidism, not elsewhere classified: Secondary | ICD-10-CM | POA: Diagnosis not present

## 2020-03-09 DIAGNOSIS — D631 Anemia in chronic kidney disease: Secondary | ICD-10-CM | POA: Diagnosis not present

## 2020-03-09 DIAGNOSIS — N184 Chronic kidney disease, stage 4 (severe): Secondary | ICD-10-CM | POA: Diagnosis not present

## 2020-03-09 NOTE — Telephone Encounter (Signed)
Called to inform patient about Rx Dupixent financial assistance program and give patient other options that dr. Denna Haggard suggested.

## 2020-03-16 NOTE — Telephone Encounter (Signed)
This encounter was created in error - please disregard.

## 2020-03-28 ENCOUNTER — Ambulatory Visit (INDEPENDENT_AMBULATORY_CARE_PROVIDER_SITE_OTHER): Payer: Medicare HMO | Admitting: Family Medicine

## 2020-03-28 ENCOUNTER — Encounter: Payer: Self-pay | Admitting: Family Medicine

## 2020-03-28 ENCOUNTER — Other Ambulatory Visit: Payer: Self-pay

## 2020-03-28 VITALS — BP 142/74 | HR 59 | Temp 97.9°F | Ht 70.0 in | Wt 186.6 lb

## 2020-03-28 DIAGNOSIS — L299 Pruritus, unspecified: Secondary | ICD-10-CM | POA: Diagnosis not present

## 2020-03-28 DIAGNOSIS — E1100 Type 2 diabetes mellitus with hyperosmolarity without nonketotic hyperglycemic-hyperosmolar coma (NKHHC): Secondary | ICD-10-CM | POA: Diagnosis not present

## 2020-03-28 DIAGNOSIS — I10 Essential (primary) hypertension: Secondary | ICD-10-CM | POA: Diagnosis not present

## 2020-03-28 DIAGNOSIS — E7849 Other hyperlipidemia: Secondary | ICD-10-CM | POA: Diagnosis not present

## 2020-03-28 LAB — POCT GLYCOSYLATED HEMOGLOBIN (HGB A1C): Hemoglobin A1C: 5.6 % (ref 4.0–5.6)

## 2020-03-28 MED ORDER — HYDROXYZINE HCL 10 MG PO TABS: 10.0000 mg | ORAL_TABLET | Freq: Three times a day (TID) | ORAL | 0 refills | Status: DC | PRN

## 2020-03-28 MED ORDER — ATORVASTATIN CALCIUM 80 MG PO TABS: 80.0000 mg | ORAL_TABLET | Freq: Every evening | ORAL | 1 refills | Status: DC

## 2020-03-28 MED ORDER — GLIMEPIRIDE 1 MG PO TABS: 1.0000 mg | ORAL_TABLET | Freq: Every day | ORAL | 1 refills | Status: DC

## 2020-03-28 MED ORDER — AMLODIPINE BESYLATE 10 MG PO TABS: 10.0000 mg | ORAL_TABLET | Freq: Every day | ORAL | 1 refills | Status: DC

## 2020-03-28 MED ORDER — METOPROLOL TARTRATE 50 MG PO TABS: 50.0000 mg | ORAL_TABLET | Freq: Two times a day (BID) | ORAL | 1 refills | Status: DC

## 2020-03-28 NOTE — Progress Notes (Signed)
Established Patient Office Visit  Subjective:  Patient ID: Jacob Farmer, male    DOB: October 04, 1946  Age: 74 y.o. MRN: [id number]  CC:  Chief Complaint  Patient presents with  . Follow-up    6 mo f/u on diabetes    HPI Jacob Farmer presents for DM-A1c 5.6%-amaryl today, Bp 121/71 at home today Hyperlipidemia-Lipitor 80mg  daily HTN-Norvasc/Lopressor/Hydralazine, nephrology following RA-[place]-labwork - dr Toni Amend Gout flare-allopurinol daily CAD-asa, nitro-no recent use GERD-protonix  Past Medical History:  Diagnosis Date  . Arteriosclerotic cardiovascular disease (ASCVD) cardiologist-  dr Roderic Palau branch   Acute myocardial infarction treated with TPA in 1990; 1999-stents to circumflex and RCA; residual total occlusion of the left anterior descending; normal ejection fraction; stress nuclear in 2001-distal anteroseptal ischemia; normal LV function  . Chronic kidney disease, stage 3, mod decreased GFR nephrologist-  at [facility] in [place]   Creatinine-2.18 and 12/2007, 1.76 and 12/2008  . First degree heart block   . History of gastric ulcer   . History of kidney stones    05/23/17- small = no pain- watching  . History of MI (myocardial infarction)    1990-  treated w/ TPA  . Hyperlipidemia   . Hypertension   . Myocardial infarction ([place]) 1990  . OA (osteoarthritis)   . Pre-diabetes   . Prediabetes   . Prostate cancer [facility]) urologist-- dr dahlstedt/  oncologist- dr Tammi Klippel   Stage T1c , Gleason 3+4,  PSA 4.7,  vol 24cc--  scheduled for radiative seed implants  . Psoriatic arthritis ([place])   . RA (rheumatoid arthritis) ([place])    rheumologist-  dr Toni Amend  . S/P drug eluting coronary stent placement    1999--  DES x2 to CFX and RCA  . Sinus bradycardia   . Wears dentures    upper and lower partial    Past Surgical History:  Procedure Laterality Date  . BIOPSY  08/27/2019   Procedure: BIOPSY;  Surgeon: Rogene Houston, MD;  [facility];  Service:  Endoscopy;;  gastric  . CARDIOVASCULAR STRESS TEST  2001  per Dr Lattie Haw clinic note   distal anteroseptal ischemia,  normal LVF  . COLONOSCOPY  last one 2006 (approx)  . CORONARY ANGIOPLASTY WITH STENT PLACEMENT  1999   DES x2  to CFX and RCA/  residual total occlusion LAD,  normal LVEF  . ESOPHAGOGASTRODUODENOSCOPY (EGD) WITH PROPOFOL N/A 08/27/2019   Procedure: ESOPHAGOGASTRODUODENOSCOPY (EGD) WITH PROPOFOL;  Surgeon: Rogene Houston, MD;  [facility];  Service: Endoscopy;  Laterality: N/A;  10:10  . EYE SURGERY Bilateral    cataract  . POSTERIOR CERVICAL FUSION/FORAMINOTOMY N/A 05/26/2017   Procedure: RIGHT C4-5 FORAMINOTOMY WITH EXCISION OF HERNIATED [place];  Surgeon: Jessy Oto, MD;  Location: [facility];  Service: Orthopedics;  Laterality: N/A;  . RADIOACTIVE SEED IMPLANT N/A 01/11/2016   Procedure: RADIOACTIVE SEED IMPLANT/BRACHYTHERAPY IMPLANT;  Surgeon: Franchot Gallo, MD;  Location: [facility];  Service: Urology;  Laterality: N/A;   51  seeds implanted no seeds founds in bladder    Family History  Problem Relation Age of Onset  . Heart attack Mother   . Coronary artery disease Father   . Ulcerative colitis Father   . Ulcers Father   . Breast cancer Sister   . COPD Brother   . Pancreatic cancer Brother   . Healthy Sister   . Diabetes Brother   . Cirrhosis Son   . Seizures Son     Social  History   Socioeconomic History  . Marital status: Married    Spouse name: Not on file  . Number of children: 2  . Years of education: Not on file  . Highest education level: Not on file  Occupational History  . Occupation: Retired Clinical biochemist  Tobacco Use  . Smoking status: Former Smoker    Packs/day: 1.00    Years: 30.00    Pack years: 30.00    Types: Cigarettes    Quit date: 05/21/1989    Years since quitting: 30.8  . Smokeless tobacco: Never Used  Substance and Sexual Activity  . Alcohol use: Yes    Alcohol/week: 20.0 standard drinks     Types: 10 Shots of liquor, 10 Standard drinks or equivalent per week    Comment: 2-3 oz per day  . Drug use: No  . Sexual activity: Not on file  Other Topics Concern  . Not on file  Social History Narrative   No regular exercise   Social Determinants of Health   Financial Resource Strain:   . Difficulty of Paying Living Expenses:   Food Insecurity:   . Worried About Charity fundraiser in the Last Year:   . Arboriculturist in the Last Year:   Transportation Needs:   . Film/video editor (Medical):   Marland Kitchen Lack of Transportation (Non-Medical):   Physical Activity:   . Days of Exercise per Week:   . Minutes of Exercise per Session:   Stress:   . Feeling of Stress :   Social Connections:   . Frequency of Communication with Friends and Family:   . Frequency of Social Gatherings with Friends and Family:   . Attends Religious Services:   . Active Member of Clubs or Organizations:   . Attends Archivist Meetings:   Marland Kitchen Marital Status:   Intimate Partner Violence:   . Fear of Current or Ex-Partner:   . Emotionally Abused:   Marland Kitchen Physically Abused:   . Sexually Abused:     Outpatient Medications Prior to Visit  Medication Sig Dispense Refill  . allopurinol (ZYLOPRIM) 100 MG tablet TAKE 1/2 TABLET (50 MG TOTAL) BY MOUTH DAILY. 45 tablet 0  . amLODipine (NORVASC) 10 MG tablet TAKE 1 TABLET EVERY DAY 90 tablet 1  . aspirin 81 MG tablet Take 1 tablet (81 mg total) by mouth 3 (three) times a week. 30 tablet   . atorvastatin (LIPITOR) 80 MG tablet Take 1 tablet (80 mg total) by mouth daily. (Patient taking differently: Take 80 mg by mouth every evening. ) 90 tablet 3  . folic acid (FOLVITE) 433 MCG tablet Take 400 mcg by mouth daily.    Marland Kitchen glimepiride (AMARYL) 1 MG tablet Take 1 mg by mouth daily before breakfast.    . hydrALAZINE (APRESOLINE) 25 MG tablet Take by mouth.    . leflunomide (ARAVA) 10 MG tablet Take 1 tablet (10 mg total) by mouth daily. 90 tablet 0  . metoprolol  (LOPRESSOR) 50 MG tablet TAKE ONE TABLET BY MOUTH TWICE DAILY (Patient taking differently: Take 50 mg by mouth 2 (two) times daily. ) 60 tablet 6  . nitroGLYCERIN (NITROSTAT) 0.4 MG SL tablet Place 0.4 mg under the tongue every 5 (five) minutes as needed for chest pain.     . pantoprazole (PROTONIX) 40 MG tablet Take 1 tablet (40 mg total) by mouth daily with breakfast. 90 tablet 1  . tacrolimus (PROTOPIC) 0.1 % ointment     . triamcinolone cream (  KENALOG) 0.1 % Apply 1 application topically 3 (three) times daily as needed (itching).      No facility-administered medications prior to visit.    No Known Allergies  ROS Review of Systems  Constitutional: Negative.   HENT: Negative.   Eyes:       Glasses-reading  Respiratory: Negative.   Cardiovascular: Negative.   Gastrointestinal: Negative.        GERD  Endocrine:       Endo  Neurological: Negative.        No peripheral neuropathy       Objective:    Physical Exam  Constitutional: He is oriented to person, place, and time. He appears well-developed and well-nourished.  Eyes: Conjunctivae are normal.  Cardiovascular: Regular rhythm.  Pulmonary/Chest: Effort normal and breath sounds normal.  Neurological: He is alert and oriented to person, place, and time.  Skin: Skin is warm.  Psychiatric: He has a normal mood and affect. His behavior is normal.    BP (!) 142/74 (BP Location: Right Arm, Patient Position: Sitting, Cuff Size: Normal)   Pulse (!) 59   Temp 97.9 F (36.6 C) (Temporal)   Ht 5\' 10"  (1.778 m)   Wt 186 lb 9.6 oz (84.6 kg)   SpO2 97%   BMI 26.77 kg/m  Wt Readings from Last 3 Encounters:  03/28/20 186 lb 9.6 oz (84.6 kg)  01/17/20 195 lb (88.5 kg)  09/28/19 192 lb (87.1 kg)     Health Maintenance Due  Topic Date Due  . Hepatitis C Screening  Never done  . TETANUS/TDAP  Never done  . COLONOSCOPY  Never done  . PNA vac Low Risk Adult (1 of 2 - PCV13) Never done    Lab Results  Component Value Date    WBC 7.2 03/01/2020   HGB 13.0 (L) 03/01/2020   HCT 40.8 03/01/2020   MCV 86.3 03/01/2020   PLT 249 03/01/2020   Lab Results  Component Value Date   NA 140 03/01/2020   K 4.8 03/01/2020   CO2 26 03/01/2020   GLUCOSE 68 03/01/2020   BUN 19 03/01/2020   CREATININE 2.09 (H) 03/01/2020   BILITOT 0.4 03/01/2020   ALKPHOS 83 05/26/2017   AST 20 03/01/2020   ALT 18 03/01/2020   PROT 6.6 03/01/2020   ALBUMIN 3.1 (L) 05/26/2017   CALCIUM 8.9 03/01/2020   ANIONGAP 6 08/24/2019   Lab Results  Component Value Date   CHOL 161 01/05/2011   Lab Results  Component Value Date   HDL 36 (L) 01/05/2011   Lab Results  Component Value Date   LDLCALC 81 01/05/2011   Lab Results  Component Value Date   TRIG 219 (H) 01/05/2011   Lab Results  Component Value Date   CHOLHDL 4.5 Ratio 01/05/2011   Lab Results  Component Value Date   HGBA1C 5.6 03/28/2020      Assessment & Plan:   Problem List Items Addressed This Visit      Endocrine   Type 2 diabetes mellitus with hyperosmolarity without coma, without long-term current use of insulin (HCC) - Primary   Relevant Orders   POCT glycosylated hemoglobin (Hb A1C) (Completed)     1. Type 2 diabetes mellitus with hyperosmolarity without coma, without long-term current use of insulin (HCC) - POCT glycosylated hemoglobin (Hb A1C)-5.6% amaryl-pt would like to continue on med daily 2. Essential hypertension Lopressor/norvasc-renal function reviewed-normal bp at home  3. Other hyperlipidemia lipitor-rx, needs lipid panel -last 7/20  4. Itching-hydroxyzine-rx  Follow-up: keep follow up appt    Lenix Benoist Hannah Beat, MD

## 2020-03-28 NOTE — Patient Instructions (Addendum)
  Have lipid panel drawn with other labs-FASTING    If you have lab work done today you will be contacted with your lab results within the next 2 weeks.  If you have not heard from [place] then please contact us. The fastest way to get your results is to register for My Chart.   IF you received an x-ray today, you will receive an invoice from [facility]. Please contact [facility] at [phone] with questions or concerns regarding your invoice.   IF you received labwork today, you will receive an invoice from [facility]. Please contact [facility] at [phone] with questions or concerns regarding your invoice.   Our billing staff will not be able to assist you with questions regarding bills from these companies.  You will be contacted with the lab results as soon as they are available. The fastest way to get your results is to activate your My Chart account. Instructions are located on the last page of this paperwork. If you have not heard from [place] regarding the results in 2 weeks, please contact this office.

## 2020-04-04 NOTE — Progress Notes (Signed)
Office Visit Note  Patient: Jacob Farmer             Date of Birth: [birth year]           MRN: [id number]             PCP: Doree Albee, MD Referring: Maryruth Hancock, MD Visit Date: 04/14/2020 Occupation: @GUAROCC @  Subjective:  Discuss medications   History of Present Illness: Jacob Farmer is a 74 y.o. male with history of seronegative rheumatoid arthritis, gout, and osteoarthritis.  He is taking arava 10 mg 1 tablet by mouth daily.  He denies missing any doses of Arava recently.  He states that his insurance is no longer covering [place] like it used to and it is costing him over $100 for a 90-day supply.  According to the patient his insurance prefers hydroxychloroquine but he is apprehensive of potential side effects as well as efficacy.  He denies any recent rheumatoid arthritis flares.  He is not having any joint pain or joint swelling at this time.  He denies any morning stiffness.  He states that about 2 weeks ago he developed a gout flare in his right knee joint.  He states that the flare lasted about 4 days.  He states that he took colchicine 0.6 mg 1 tablet daily for 4 days which resolved his symptoms.  He states that he did not miss any doses of allopurinol.  He continues take allopurinol 100 mg half tablet by mouth daily.  He denies drinking any alcohol or eating shellfish or red meat prior to the flare.  He denies any joint pain or swelling in the right knee joint at this time.  He states that he needs a another sample of colchicine.   Activities of Daily Living:  Patient reports morning stiffness for 0 none.   Patient Reports nocturnal pain.  Difficulty dressing/grooming: Denies Difficulty climbing stairs: Denies Difficulty getting out of chair: Denies Difficulty using hands for taps, buttons, cutlery, and/or writing: Denies  Review of Systems  Constitutional: Negative for fatigue and night sweats.  HENT: Negative for mouth sores, mouth dryness and nose dryness.     Eyes: Negative for redness and dryness.  Respiratory: Negative for shortness of breath and difficulty breathing.   Cardiovascular: Positive for swelling in legs/feet. Negative for chest pain, palpitations, hypertension and irregular heartbeat.  Gastrointestinal: Negative for constipation and diarrhea.  Genitourinary: Negative for difficulty urinating and painful urination.  Musculoskeletal: Negative for arthralgias, joint pain, joint swelling, myalgias, muscle weakness, morning stiffness, muscle tenderness and myalgias.  Skin: Negative for color change, rash, hair loss, nodules/bumps, skin tightness, ulcers and sensitivity to sunlight.  Allergic/Immunologic: Negative for susceptible to infections.  Neurological: Negative for dizziness, fainting, numbness, memory loss, night sweats and weakness.  Hematological: Negative for bruising/bleeding tendency and swollen glands.  Psychiatric/Behavioral: Positive for sleep disturbance. Negative for depressed mood. The patient is not nervous/anxious.     PMFS History:  Patient Active Problem List   Diagnosis Date Noted  . History of gastric ulcer   . Itching 09/01/2019  . Abnormal weight loss 07/31/2019  . Abnormal findings on diagnostic imaging of other abdominal regions, including retroperitoneum 07/29/2019  . Fatigue 07/29/2019  . Herniated cervical disc 05/26/2017    Class: Acute  . Herniation of cervical intervertebral disc with radiculopathy 05/26/2017  . Cervical spinal stenosis 05/26/2017  . Lumbar disc disease 02/04/2017  . Primary osteoarthritis of both hands 02/04/2017  . History of gout 02/04/2017  .  DDD (degenerative disc disease), lumbar 02/04/2017  . Pain in joint of left knee 02/04/2017  . High risk medication use 01/28/2017  . History of prostate cancer 01/28/2017  . History of renal insufficiency syndrome 01/28/2017  . Malignant neoplasm of prostate ([place]) 11/17/2015  . Stage 3a chronic kidney disease 12/27/2011  . Type 2  diabetes mellitus with hyperosmolarity without coma, without long-term current use of insulin ([place]) 12/27/2011  . Rheumatoid arthritis ([place]) 01/04/2011  . SINUS BRADYCARDIA 01/12/2010  . 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 Hyperlipidemia 01/08/2010  . Essential hypertension 01/08/2010    Past Medical History:  Diagnosis Date  . Arteriosclerotic cardiovascular disease (ASCVD) cardiologist-  dr Roderic [facility]   Acute myocardial infarction treated with TPA in 1990; 1999-stents to circumflex and RCA; residual total occlusion of the left anterior descending; normal ejection fraction; stress nuclear in 2001-distal anteroseptal ischemia; normal LV function  . Chronic kidney disease, stage 3, mod decreased GFR nephrologist-  at [facility] in [place]   Creatinine-2.18 and 12/2007, 1.76 and 12/2008  . First degree heart block   . History of gastric ulcer   . History of kidney stones    05/23/17- small = no pain- watching  . History of MI (myocardial infarction)    1990-  treated w/ TPA  . Hyperlipidemia   . Hypertension   . Myocardial infarction ([place]) 1990  . OA (osteoarthritis)   . Pre-diabetes   . Prediabetes   . Prostate cancer [facility]) urologist-- dr dahlstedt/  oncologist- dr Tammi Klippel   Stage T1c , Gleason 3+4,  PSA 4.7,  vol 24cc--  scheduled for radiative seed implants  . Psoriatic arthritis ([place])   . RA (rheumatoid arthritis) ([place])    rheumologist-  dr Toni Amend  . S/P drug eluting coronary stent placement    1999--  DES x2 to CFX and RCA  . Sinus bradycardia   . Wears dentures    upper and lower partial    Family History  Problem Relation Age of Onset  . Heart attack Mother   . Coronary artery disease Father   . Ulcerative colitis Father   . Ulcers Father   . Breast cancer Sister   . COPD Brother   . Pancreatic cancer Brother   . Healthy Sister   . Diabetes Brother   . Cirrhosis Son   . Seizures Son    Past Surgical History:  Procedure  Laterality Date  . BIOPSY  08/27/2019   Procedure: BIOPSY;  Surgeon: Rogene Houston, MD;  [facility];  Service: Endoscopy;;  gastric  . CARDIOVASCULAR STRESS TEST  2001  per Dr Lattie Haw clinic note   distal anteroseptal ischemia,  normal LVF  . COLONOSCOPY  last one 2006 (approx)  . CORONARY ANGIOPLASTY WITH STENT PLACEMENT  1999   DES x2  to CFX and RCA/  residual total occlusion LAD,  normal LVEF  . ESOPHAGOGASTRODUODENOSCOPY (EGD) WITH PROPOFOL N/A 08/27/2019   Procedure: ESOPHAGOGASTRODUODENOSCOPY (EGD) WITH PROPOFOL;  Surgeon: Rogene Houston, MD;  [facility];  Service: Endoscopy;  Laterality: N/A;  10:10  . EYE SURGERY Bilateral    cataract  . POSTERIOR CERVICAL FUSION/FORAMINOTOMY N/A 05/26/2017   Procedure: RIGHT C4-5 FORAMINOTOMY WITH EXCISION OF HERNIATED [place];  Surgeon: Jessy Oto, MD;  Location: [facility];  Service: Orthopedics;  Laterality: N/A;  . 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   Immunization History  Administered Date(s) Administered  . Influenza, High Dose Seasonal PF 09/12/2017, 08/28/2019  . PFIZER SARS-COV-2 Vaccination 01/22/2020, 02/12/2020  . Zoster Recombinat (Shingrix) 07/29/2019     Objective: Vital Signs: BP (!) 164/85 (BP Location: Left Arm, Patient Position: Sitting, Cuff Size: Normal)   Pulse 60   Ht 5\' 10"  (1.778 m)   Wt 187 lb (84.8 kg)   BMI 26.83 kg/m    Physical Exam Vitals and nursing note reviewed.  Constitutional:      Appearance: He is well-developed.  HENT:     Head: Normocephalic and atraumatic.  Eyes:     Conjunctiva/sclera: Conjunctivae normal.     Pupils: Pupils are equal, round, and reactive  to light.  Pulmonary:     Effort: Pulmonary effort is normal.  Abdominal:     General: Bowel sounds are normal.     Palpations: Abdomen is soft.  Musculoskeletal:     Cervical back: Normal range of motion and neck supple.  Skin:    General: Skin is warm and dry.     Capillary Refill: Capillary refill takes less than 2 seconds.  Neurological:     Mental Status: He is alert and oriented to person, place, and time.  Psychiatric:        Behavior: Behavior normal.      Musculoskeletal Exam: C-spine limited range of motion with lateral rotation.  Thoracic and lumbar spine have good range of motion.  No midline spinal tenderness.  No SI joint tenderness.  Shoulder joints, elbow joints, wrist joints, MCPs, PIPs, DIPs good range of motion with no synovitis.  He has PIP and DIP thickening consistent with osteoarthritis of both hands.  He has complete fist formation bilaterally.  Hip joints have good range of motion with no discomfort.  Knee joints have good range of motion with no warmth or effusion.  Ankle joints have good range of motion with no tenderness or inflammation.  No tenderness of MTP joints.  CDAI Exam: CDAI Score: 0.2  Patient Global: 1 mm; Provider Global: 1 mm Swollen: 0 ; Tender: 0  Joint Exam 04/14/2020   No joint exam has been documented for this visit   There is currently no information documented on the homunculus. Go to the Rheumatology activity and complete the homunculus joint exam.  Investigation: No additional findings.  Imaging: No results found.  Recent Labs: Lab Results  Component Value Date   WBC 7.2 03/01/2020   HGB 13.0 (L) 03/01/2020   PLT 249 03/01/2020   NA 140 03/01/2020   K 4.8 03/01/2020   CL 105 03/01/2020   CO2 26 03/01/2020   GLUCOSE 68 03/01/2020   BUN 19 03/01/2020   CREATININE 2.09 (H) 03/01/2020   BILITOT 0.4 03/01/2020   ALKPHOS 83 05/26/2017   AST 20 03/01/2020   ALT 18 03/01/2020   PROT 6.6 03/01/2020   ALBUMIN 3.1 (L)  05/26/2017   CALCIUM 8.9 03/01/2020   GFRAA 35 (L) 03/01/2020    Speciality Comments: No specialty comments available.  Procedures:  No procedures performed Allergies: Patient has no known allergies.   Assessment / Plan:     Visit Diagnoses: Seronegative rheumatoid arthritis ([place]) -He has no synovitis on exam.  Has not had any recent rheumatoid arthritis flares.  He has no joint  pain or inflammation at this time.  No morning stiffness.  He has not had any difficulties with ADLs recently.  He is clinically been doing well on Arava 10 mg 1 tablet by mouth daily.  He has not missed any doses of Arava recently.  According to the patient his insurance is no longer covering [place] like it used to and it is costing over $100 for 90-day supply.  He was given a printed prescription refill for Arava 10 mg 1 tablet by mouth daily 90-day supply to take with him to the pharmacy.  He was also given a coupon for good Rx for discounted pricing.  His arthritis has been very well controlled on Arava so we do not want to make any changes at this time.  He was advised to notify us if he develops increased joint pain or joint swelling.  To follow-up in the office in 5 months.  Plan: leflunomide (ARAVA) 10 MG tablet  High risk medication use - Arava 10 mg 1 tablet by mouth daily.  CBC and CMP were drawn on 03/01/2020.  He is due to update lab work in June and every 3 months.  According to the patient he has an upcoming appointment with his nephrologist in 2 to 3 weeks and will be having updated lab work at that time.  Idiopathic chronic gout of multiple sites without tophus -He had a flare of gout in his right knee joint 2 weeks ago.  At that time he was experiencing pain and swelling which resolved after taking colchicine 0.6 mg 1 tablet by mouth daily for 4 days.  He denies any triggers for his flare.  He has not had any red meat, shellfish, or alcohol recently.  He continues to take allopurinol 100 mg half tablet by  mouth daily.  He has not missed any doses of allopurinol recently.  He was provided 2 samples for colchicine to take as needed.  His uric acid level was 5.9 on 12/02/2019.  We discussed that ideally his uric acid level should be below 6.  We discussed rechecking his uric acid level today but he would like to have it drawn with his upcoming lab work at his nephrologist office.  He was given an order to have uric acid drawn at that time.  He will continue taking allopurinol as prescribed.  A refill of allopurinol was sent to the pharmacy today.  He was advised to notify us if he continues to have recurrent gout flares.  Plan: Uric acid  Primary osteoarthritis of both hands: He has PIP and DIP thickening consistent with osteoarthritis of both hands.  He has no tenderness or synovitis on exam.  He has complete fist formation bilaterally.  Joint protection and muscle strengthening were discussed.  DDD (degenerative disc disease), lumbar: He is not experiencing any lower back pain at this time.  He has been experiencing some stiffness and discomfort in the thoracic spine and states that he recently purchased a new mattress.  He was advised to notify us if the discomfort persists or worsens.  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 Uric acid   Meds ordered this encounter  Medications  .  leflunomide (ARAVA) 10 MG tablet    Sig: Take 1 tablet (10 mg total) by mouth daily.    Dispense:  90 tablet    Refill:  0  . allopurinol (ZYLOPRIM) 100 MG tablet    Sig: TAKE 1/2 TABLET (50 MG TOTAL) BY MOUTH DAILY.    Dispense:  45 tablet    Refill:  0     Face-to-face time spent with patient was 30 minutes. Greater than 50% of time was spent in counseling and coordination of care.  Follow-Up Instructions: Return in about 5 months (around 09/14/2020) for Rheumatoid arthritis, Osteoarthritis, Gout.   Ofilia Neas, PA-C  Note - This record has been created using Dragon software.  Chart creation errors have been sought, but may not always  have been located. Such creation errors do not reflect on  the standard of medical care.

## 2020-04-12 ENCOUNTER — Other Ambulatory Visit: Payer: Self-pay

## 2020-04-12 ENCOUNTER — Encounter (INDEPENDENT_AMBULATORY_CARE_PROVIDER_SITE_OTHER): Payer: Self-pay | Admitting: Internal Medicine

## 2020-04-12 ENCOUNTER — Ambulatory Visit (INDEPENDENT_AMBULATORY_CARE_PROVIDER_SITE_OTHER): Payer: Medicare HMO | Admitting: Internal Medicine

## 2020-04-12 VITALS — BP 140/85 | HR 78 | Temp 97.3°F | Resp 18 | Ht 70.0 in | Wt 187.0 lb

## 2020-04-12 DIAGNOSIS — E1122 Type 2 diabetes mellitus with diabetic chronic kidney disease: Secondary | ICD-10-CM | POA: Diagnosis not present

## 2020-04-12 DIAGNOSIS — E782 Mixed hyperlipidemia: Secondary | ICD-10-CM

## 2020-04-12 DIAGNOSIS — E1100 Type 2 diabetes mellitus with hyperosmolarity without nonketotic hyperglycemic-hyperosmolar coma (NKHHC): Secondary | ICD-10-CM

## 2020-04-12 DIAGNOSIS — N1831 Chronic kidney disease, stage 3a: Secondary | ICD-10-CM | POA: Diagnosis not present

## 2020-04-12 DIAGNOSIS — M069 Rheumatoid arthritis, unspecified: Secondary | ICD-10-CM | POA: Diagnosis not present

## 2020-04-12 DIAGNOSIS — I10 Essential (primary) hypertension: Secondary | ICD-10-CM | POA: Diagnosis not present

## 2020-04-12 NOTE — Progress Notes (Signed)
Metrics: Intervention Frequency ACO  Documented Smoking Status Yearly  Screened one or more times in 24 months  Cessation Counseling or  Active cessation medication Past 24 months  Past 24 months   Guideline developer: UpToDate (See UpToDate for funding source) Date Released: 2014       Wellness Office Visit  Subjective:  Patient ID: Jacob Farmer, male    DOB: 11-18-1946  Age: 74 y.o. MRN: [id number]  CC: This 74 year old man comes to our practice to establish care. HPI  He is diabetic with chronic kidney disease.  Thankfully, his hemoglobin A1c a couple of months ago was normal and he has now been taken off all medications. He sees a nephrologist for his chronic kidney disease, Dr. Theador Hawthorne. He also has a history of prostate cancer and was treated with seeds. He has a history of hyperlipidemia and continues on statin therapy. He has a history of coronary artery disease and is on medications for this and follows with cardiology. He also has rheumatoid arthritis and follows with rheumatology. Past Medical History:  Diagnosis Date  . Arteriosclerotic cardiovascular disease (ASCVD) cardiologist-  dr Roderic [facility]au [facility]   Acute myocardial infarction treated with TPA in 1990; 1999-stents to circumflex and RCA; residual total occlusion of the left anterior descending; normal ejection fraction; stress nuclear in 2001-distal anteroseptal ischemia; normal LV function  . Chronic kidney disease, stage 3, mod decreased GFR nephrologist-  at [facility] in [place]   Creatinine-2.18 and 12/2007, 1.76 and 12/2008  . First degree heart block   . History of gastric ulcer   . History of kidney stones    05/23/17- small = no pain- watching  . History of MI (myocardial infarction)    1990-  treated w/ TPA  . Hyperlipidemia   . Hypertension   . Myocardial infarction ([place]) 1990  . OA (osteoarthritis)   . Pre-diabetes   . Prediabetes   . Prostate cancer [facility]) urologist-- dr dahlstedt/  oncologist- dr  Tammi Klippel   Stage T1c , Gleason 3+4,  PSA 4.7,  vol 24cc--  scheduled for radiative seed implants  . Psoriatic arthritis ([place])   . RA (rheumatoid arthritis) ([place])    rheumologist-  dr Toni Amend  . S/P drug eluting coronary stent placement    1999--  DES x2 to CFX and RCA  . Sinus bradycardia   . Wears dentures    upper and lower partial      Family History  Problem Relation Age of Onset  . Heart attack Mother   . Coronary artery disease Father   . Ulcerative colitis Father   . Ulcers Father   . Breast cancer Sister   . COPD Brother   . Pancreatic cancer Brother   . Healthy Sister   . Diabetes Brother   . Cirrhosis Son   . 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   Social History   Tobacco Use  . Smoking status: Former Smoker    Packs/day: 1.00    Years: 30.00    Pack years: 30.00    Types: Cigarettes    Quit date: 05/21/1989    Years since quitting: 30.9  . Smokeless tobacco: Never Used  Substance Use Topics  . Alcohol use: Yes    Alcohol/week: 20.0 standard drinks    Types: 10 Shots of liquor, 10 Standard drinks or equivalent per week    Comment: 2-3 oz per day  Current Meds  Medication Sig  . allopurinol (ZYLOPRIM) 100 MG tablet TAKE 1/2 TABLET (50 MG TOTAL) BY MOUTH DAILY.  Marland Kitchen amLODipine (NORVASC) 10 MG tablet Take 1 tablet (10 mg total) by mouth daily.  Marland Kitchen aspirin 81 MG tablet Take 1 tablet (81 mg total) by mouth 3 (three) times a week.  Marland Kitchen atorvastatin (LIPITOR) 80 MG tablet Take 1 tablet (80 mg total) by mouth every evening.  . folic acid (FOLVITE) 989 MCG tablet Take 400 mcg by mouth daily.  . hydrALAZINE (APRESOLINE) 25 MG tablet Take by mouth.  . leflunomide (ARAVA) 10 MG tablet Take 1 tablet (10 mg total) by mouth daily.  . metoprolol tartrate (LOPRESSOR) 50 MG tablet Take 1 tablet (50 mg total) by mouth 2 (two) times daily.  . nitroGLYCERIN (NITROSTAT) 0.4 MG SL tablet Place 0.4 mg under  the tongue every 5 (five) minutes as needed for chest pain.   . pantoprazole (PROTONIX) 40 MG tablet Take 1 tablet (40 mg total) by mouth daily with breakfast.  . tacrolimus (PROTOPIC) 0.1 % ointment   . triamcinolone cream (KENALOG) 0.1 % Apply 1 application topically 3 (three) times daily as needed (itching).        Objective:   Today's Vitals: BP 140/85 (BP Location: Right Arm, Patient Position: Sitting, Cuff Size: Normal)   Pulse 78   Temp (!) 97.3 F (36.3 C) (Temporal)   Resp 18   Ht 5\' 10"  (1.778 m)   Wt 187 lb (84.8 kg)   SpO2 98%   BMI 26.83 kg/m  Vitals with BMI 04/12/2020 03/28/2020 03/28/2020  Height 5\' 10"  - 5\' 10"   Weight 187 lbs - 186 lbs 10 oz  BMI 21.19 - 41.74  Systolic 081 448 185  Diastolic 85 74 74  Pulse 78 - 59     Physical Exam   He looks systemically well.  Blood pressure reasonable for his age.  He is alert and orientated without any obvious focal neurological signs.    Assessment   1. Type 2 diabetes mellitus with hyperosmolarity without coma, without long-term current use of insulin ([place])   2. Essential hypertension   3. Mixed hyperlipidemia   4. Rheumatoid arthritis, involving unspecified site, unspecified whether rheumatoid factor present ([place])   5. Stage 3a chronic kidney disease       Tests ordered No orders of the defined types were placed in this encounter.    Plan: 1. He will continue with all medications for his chronic conditions above. 2. I will see him in 3 months time and we will repeat blood work then. 3. Today I spent 30 minutes with this patient reviewing his medical records and discussing his conditions with him in the philosophy of the practice also.   No orders of the defined types were placed in this encounter.   Doree Albee, MD

## 2020-04-13 ENCOUNTER — Telehealth (INDEPENDENT_AMBULATORY_CARE_PROVIDER_SITE_OTHER): Payer: Self-pay | Admitting: Internal Medicine

## 2020-04-14 ENCOUNTER — Ambulatory Visit (INDEPENDENT_AMBULATORY_CARE_PROVIDER_SITE_OTHER): Payer: Medicare HMO | Admitting: Physician Assistant

## 2020-04-14 ENCOUNTER — Encounter: Payer: Self-pay | Admitting: Physician Assistant

## 2020-04-14 ENCOUNTER — Other Ambulatory Visit: Payer: Self-pay

## 2020-04-14 VITALS — BP 164/85 | HR 60 | Ht 70.0 in | Wt 187.0 lb

## 2020-04-14 DIAGNOSIS — Z8679 Personal history of other diseases of the circulatory system: Secondary | ICD-10-CM | POA: Diagnosis not present

## 2020-04-14 DIAGNOSIS — Z87448 Personal history of other diseases of urinary system: Secondary | ICD-10-CM

## 2020-04-14 DIAGNOSIS — M5136 Other intervertebral disc degeneration, lumbar region: Secondary | ICD-10-CM

## 2020-04-14 DIAGNOSIS — M1A09X0 Idiopathic chronic gout, multiple sites, without tophus (tophi): Secondary | ICD-10-CM | POA: Diagnosis not present

## 2020-04-14 DIAGNOSIS — M19041 Primary osteoarthritis, right hand: Secondary | ICD-10-CM

## 2020-04-14 DIAGNOSIS — Z79899 Other long term (current) drug therapy: Secondary | ICD-10-CM

## 2020-04-14 DIAGNOSIS — Z8546 Personal history of malignant neoplasm of prostate: Secondary | ICD-10-CM | POA: Diagnosis not present

## 2020-04-14 DIAGNOSIS — M0600 Rheumatoid arthritis without rheumatoid factor, unspecified site: Secondary | ICD-10-CM | POA: Diagnosis not present

## 2020-04-14 DIAGNOSIS — R21 Rash and other nonspecific skin eruption: Secondary | ICD-10-CM | POA: Diagnosis not present

## 2020-04-14 DIAGNOSIS — Z8719 Personal history of other diseases of the digestive system: Secondary | ICD-10-CM

## 2020-04-14 DIAGNOSIS — Z8639 Personal history of other endocrine, nutritional and metabolic disease: Secondary | ICD-10-CM

## 2020-04-14 DIAGNOSIS — M19042 Primary osteoarthritis, left hand: Secondary | ICD-10-CM

## 2020-04-14 MED ORDER — ALLOPURINOL 100 MG PO TABS: ORAL_TABLET | ORAL | 0 refills | Status: DC

## 2020-04-14 MED ORDER — LEFLUNOMIDE 10 MG PO TABS: 10.0000 mg | ORAL_TABLET | Freq: Every day | ORAL | 0 refills | Status: DC

## 2020-04-14 NOTE — Patient Instructions (Signed)
Standing Labs We placed an order today for your standing lab work.    Please come back and get your standing labs in June and every 3 months   We have open lab daily Monday through Thursday from 8:30-12:30 PM and 1:30-4:30 PM and Friday from 8:30-12:30 PM and 1:30-4:00 PM at the office of Dr. Bo Merino.   You may experience shorter wait times on Monday and Friday afternoons. The office is located at [address], [place], [place], [place] [address] No appointment is necessary.   Labs are drawn by [facility].  You may receive a bill from [facility] for your lab work.  If you wish to have your labs drawn at another location, please call the office 24 hours in advance to send orders.  If you have any questions regarding directions or hours of operation,  please call [phone].   Just as a reminder please drink plenty of water prior to coming for your lab work. Thanks!

## 2020-04-14 NOTE — Progress Notes (Signed)
Medication Samples have been provided to the patient.  Drug name: mitigare Strength: 0.6mg       Qty: 2 LOT: [id number] Exp.Date: 07/2020  Dosing instructions: Take 1 capsule by mouth as needed.   The patient has been instructed regarding the correct time, dose, and frequency of taking this medication, including desired effects and most common side effects.   Williemae Area Ewald Beg 11:16 AM 04/14/2020

## 2020-04-18 NOTE — Telephone Encounter (Signed)
Done

## 2020-04-21 ENCOUNTER — Encounter: Payer: Self-pay | Admitting: Family Medicine

## 2020-04-21 ENCOUNTER — Other Ambulatory Visit: Payer: Self-pay | Admitting: Family Medicine

## 2020-04-21 LAB — COLOGUARD

## 2020-04-23 LAB — COLOGUARD

## 2020-05-02 ENCOUNTER — Telehealth: Payer: Self-pay

## 2020-05-02 NOTE — Telephone Encounter (Signed)
Office received a fax from [employer] that the patient had sent back empty stool sample containers, Per conversation with pt. He declines to do this testing at this time. Has a new PCP as well.

## 2020-05-08 ENCOUNTER — Other Ambulatory Visit: Payer: Self-pay

## 2020-05-08 ENCOUNTER — Encounter (INDEPENDENT_AMBULATORY_CARE_PROVIDER_SITE_OTHER): Payer: Self-pay | Admitting: Internal Medicine

## 2020-05-08 ENCOUNTER — Ambulatory Visit (INDEPENDENT_AMBULATORY_CARE_PROVIDER_SITE_OTHER): Payer: Medicare HMO | Admitting: Internal Medicine

## 2020-05-08 VITALS — BP 160/80 | HR 80 | Temp 97.9°F | Resp 18 | Ht 70.0 in | Wt 185.0 lb

## 2020-05-08 DIAGNOSIS — R079 Chest pain, unspecified: Secondary | ICD-10-CM | POA: Diagnosis not present

## 2020-05-08 MED ORDER — TRAMADOL HCL 50 MG PO TABS: 50.0000 mg | ORAL_TABLET | Freq: Three times a day (TID) | ORAL | 0 refills | Status: DC | PRN

## 2020-05-08 NOTE — Progress Notes (Signed)
Metrics: Intervention Frequency ACO  Documented Smoking Status Yearly  Screened one or more times in 24 months  Cessation Counseling or  Active cessation medication Past 24 months  Past 24 months   Guideline developer: UpToDate (See UpToDate for funding source) Date Released: 2014       Wellness Office Visit  Subjective:  Patient ID: Jacob Farmer, male    DOB: 19-Mar-1946  Age: 74 y.o. MRN: [id number]  CC: This is an acute visit with chief complaint of back and chest pain. HPI  He has had the symptoms for the last 6 weeks.  It seemed to come on acutely but now is constant.  He describes it as a deep pain inside his chest/lungs.  He denies any cough or increasing dyspnea.  There is no hemoptysis.  He cannot seem to get comfortable.  The pain is more of a sharp pain but he is not more descriptive than this. He has not noticed any kind of rash on his skin on the chest. Past Medical History:  Diagnosis Date  . Arteriosclerotic cardiovascular disease (ASCVD) cardiologist-  dr Roderic [facility]au branch   Acute myocardial infarction treated with TPA in 1990; 1999-stents to circumflex and RCA; residual total occlusion of the left anterior descending; normal ejection fraction; stress nuclear in 2001-distal anteroseptal ischemia; normal LV function  . Chronic kidney disease, stage 3, mod decreased GFR nephrologist-  at [facility] in [place]   Creatinine-2.18 and 12/2007, 1.76 and 12/2008  . First degree heart block   . History of gastric ulcer   . History of kidney stones    05/23/17- small = no pain- watching  . History of MI (myocardial infarction)    1990-  treated w/ TPA  . Hyperlipidemia   . Hypertension   . Myocardial infarction ([place]) 1990  . OA (osteoarthritis)   . Pre-diabetes   . Prediabetes   . Prostate cancer [facility]) urologist-- dr dahlstedt/  oncologist- dr Tammi Klippel   Stage T1c , Gleason 3+4,  PSA 4.7,  vol 24cc--  scheduled for radiative seed implants  . Psoriatic arthritis ([place])   .  RA (rheumatoid arthritis) ([place])    rheumologist-  dr Toni Amend  . S/P drug eluting coronary stent placement    1999--  DES x2 to CFX and RCA  . Sinus bradycardia   . Wears dentures    upper and lower partial      Family History  Problem Relation Age of Onset  . Heart attack Mother   . Coronary artery disease Father   . Ulcerative colitis Father   . Ulcers Father   . Breast cancer Sister   . COPD Brother   . Pancreatic cancer Brother   . Healthy Sister   . Diabetes Brother   . Cirrhosis Son   . 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   Social History   Tobacco Use  . Smoking status: Former Smoker    Packs/day: 1.00    Years: 30.00    Pack years: 30.00    Types: Cigarettes    Quit date: 05/21/1989    Years since quitting: 30.9  . Smokeless tobacco: Never Used  Substance Use Topics  . Alcohol use: Yes    Alcohol/week: 20.0 standard drinks    Types: 10 Shots of liquor, 10 Standard drinks or equivalent per week    Comment: 2-3 oz per day    Current Meds  Medication  Sig  . allopurinol (ZYLOPRIM) 100 MG tablet TAKE 1/2 TABLET (50 MG TOTAL) BY MOUTH DAILY.  Marland Kitchen amLODipine (NORVASC) 10 MG tablet Take 1 tablet (10 mg total) by mouth daily.  Marland Kitchen aspirin 81 MG tablet Take 1 tablet (81 mg total) by mouth 3 (three) times a week.  Marland Kitchen atorvastatin (LIPITOR) 80 MG tablet Take 1 tablet (80 mg total) by mouth every evening.  Marland Kitchen augmented betamethasone dipropionate (DIPROLENE-AF) 0.05 % cream   . cholecalciferol (VITAMIN D3) 25 MCG (1000 UNIT) tablet Take 1,000 Units by mouth daily.  . folic acid (FOLVITE) 630 MCG tablet Take 400 mcg by mouth daily.  . hydrALAZINE (APRESOLINE) 25 MG tablet Take by mouth.  . leflunomide (ARAVA) 10 MG tablet Take 1 tablet (10 mg total) by mouth daily.  . metoprolol tartrate (LOPRESSOR) 50 MG tablet Take 1 tablet (50 mg total) by mouth 2 (two) times daily.  . nitroGLYCERIN (NITROSTAT) 0.4 MG  SL tablet Place 0.4 mg under the tongue every 5 (five) minutes as needed for chest pain.   . pantoprazole (PROTONIX) 40 MG tablet Take 1 tablet (40 mg total) by mouth daily with breakfast.  . tacrolimus (PROTOPIC) 0.1 % ointment   . triamcinolone cream (KENALOG) 0.1 % Apply 1 application topically 3 (three) times daily as needed (itching).        Depression screen [facility] 2/9 03/28/2020 09/28/2019 09/01/2019 07/27/2019  Decreased Interest 0 0 0 0  Down, Depressed, Hopeless 0 0 0 -  PHQ - 2 Score 0 0 0 0     Objective:   Today's Vitals: BP (!) 160/80 (BP Location: Right Arm, Patient Position: Sitting, Cuff Size: Normal)   Pulse 80   Temp 97.9 F (36.6 C) (Temporal)   Resp 18   Ht 5\' 10"  (1.778 m)   Wt 185 lb (83.9 kg)   SpO2 97%   BMI 26.54 kg/m  Vitals with BMI 05/08/2020 04/14/2020 04/12/2020  Height 5\' 10"  5\' 10"  5\' 10"   Weight 185 lbs 187 lbs 187 lbs  BMI 26.54 16.01 09.32  Systolic 355 732 202  Diastolic 80 85 85  Pulse 80 60 78     Physical Exam  Looks systemically well and does not appear to be in any distress.  There is some sort of rash on the back of his chest but I am not sure that this is a shingles rash.  Lung fields are clear.  There is no pleural or pericardial rub.  There is no neck or supraclavicular lymphadenopathy.     Assessment   1. Chest pain, unspecified type       Tests ordered Orders Placed This Encounter  Procedures  . CT Chest Wo Contrast     Plan: 1. The objective findings are minimal based on his symptoms.  However I am concerned about his history and I would rather get a CT scan of the chest to make sure there is nothing sinister going on inside his lungs.  I will try to organize this as soon as possible. 2. In the meantime, I am going to send a prescription for tramadol.  Apparently cannot tolerate gabapentin or Lyrica which of the recommendations I made earlier and we will simply try tramadol now. 3. Follow-up in 1 month.   No  orders of the defined types were placed in this encounter.   Doree Albee, MD

## 2020-05-09 ENCOUNTER — Telehealth: Payer: Self-pay

## 2020-05-09 ENCOUNTER — Ambulatory Visit (INDEPENDENT_AMBULATORY_CARE_PROVIDER_SITE_OTHER): Payer: Medicare HMO | Admitting: Internal Medicine

## 2020-05-09 NOTE — Telephone Encounter (Signed)
Patient's information from [place] faxed over to [place] [employer].

## 2020-05-09 NOTE — Telephone Encounter (Signed)
Patient walked in to give [place] his information from Social Security to be sent to [place] [employer] to see if patient can get assistance with the cost of the [place].

## 2020-05-12 DIAGNOSIS — R809 Proteinuria, unspecified: Secondary | ICD-10-CM | POA: Diagnosis not present

## 2020-05-12 DIAGNOSIS — E559 Vitamin D deficiency, unspecified: Secondary | ICD-10-CM | POA: Diagnosis not present

## 2020-05-12 DIAGNOSIS — D631 Anemia in chronic kidney disease: Secondary | ICD-10-CM | POA: Diagnosis not present

## 2020-05-12 DIAGNOSIS — N170 Acute kidney failure with tubular necrosis: Secondary | ICD-10-CM | POA: Diagnosis not present

## 2020-05-12 DIAGNOSIS — E7849 Other hyperlipidemia: Secondary | ICD-10-CM | POA: Diagnosis not present

## 2020-05-12 DIAGNOSIS — N189 Chronic kidney disease, unspecified: Secondary | ICD-10-CM | POA: Diagnosis not present

## 2020-05-12 DIAGNOSIS — I129 Hypertensive chronic kidney disease with stage 1 through stage 4 chronic kidney disease, or unspecified chronic kidney disease: Secondary | ICD-10-CM | POA: Diagnosis not present

## 2020-05-12 DIAGNOSIS — N184 Chronic kidney disease, stage 4 (severe): Secondary | ICD-10-CM | POA: Diagnosis not present

## 2020-05-12 DIAGNOSIS — E211 Secondary hyperparathyroidism, not elsewhere classified: Secondary | ICD-10-CM | POA: Diagnosis not present

## 2020-05-13 LAB — LIPID PANEL
Cholesterol: 141 mg/dL (ref <200)
HDL: 35 mg/dL — ABNORMAL LOW (ref >=40)
LDL Cholesterol (Calc): 83 mg/dL (calc)
Non-HDL Cholesterol (Calc): 106 mg/dL (calc) (ref <130)
Total CHOL/HDL Ratio: 4 (calc) (ref <5.0)
Triglycerides: 136 mg/dL (ref <150)

## 2020-05-16 ENCOUNTER — Telehealth: Payer: Self-pay | Admitting: Rheumatology

## 2020-05-16 MED ORDER — ALLOPURINOL 100 MG PO TABS: ORAL_TABLET | ORAL | 0 refills | Status: DC

## 2020-05-16 NOTE — Telephone Encounter (Signed)
Last Visit: 04/14/2020 Next Visit: 09/14/2020 Labs: 03/01/2020 CBC stable. GFR is low and is stable  Current Dose per office note 04/14/2020: allopurinol 100 mg half tablet by mouth daily  Okay to refill per Dr. Estanislado Pandy

## 2020-05-16 NOTE — Telephone Encounter (Signed)
Lauren from [facility] called requesting prescription refill for the patient.  Lauren states a fax was sent to Dr. Estanislado Pandy on 05/08/20 for a refill request, but didn't receive a response.  Fax [phone]  Phone [phone]

## 2020-05-19 DIAGNOSIS — D631 Anemia in chronic kidney disease: Secondary | ICD-10-CM | POA: Diagnosis not present

## 2020-05-19 DIAGNOSIS — E211 Secondary hyperparathyroidism, not elsewhere classified: Secondary | ICD-10-CM | POA: Diagnosis not present

## 2020-05-19 DIAGNOSIS — N184 Chronic kidney disease, stage 4 (severe): Secondary | ICD-10-CM | POA: Diagnosis not present

## 2020-05-19 DIAGNOSIS — N189 Chronic kidney disease, unspecified: Secondary | ICD-10-CM | POA: Diagnosis not present

## 2020-05-19 DIAGNOSIS — R809 Proteinuria, unspecified: Secondary | ICD-10-CM | POA: Diagnosis not present

## 2020-05-19 DIAGNOSIS — I129 Hypertensive chronic kidney disease with stage 1 through stage 4 chronic kidney disease, or unspecified chronic kidney disease: Secondary | ICD-10-CM | POA: Diagnosis not present

## 2020-05-25 ENCOUNTER — Telehealth (INDEPENDENT_AMBULATORY_CARE_PROVIDER_SITE_OTHER): Payer: Self-pay

## 2020-05-29 ENCOUNTER — Telehealth (INDEPENDENT_AMBULATORY_CARE_PROVIDER_SITE_OTHER): Payer: Self-pay

## 2020-05-29 ENCOUNTER — Other Ambulatory Visit: Payer: Self-pay | Admitting: *Deleted

## 2020-05-29 ENCOUNTER — Telehealth: Payer: Self-pay | Admitting: Rheumatology

## 2020-05-29 DIAGNOSIS — Z79899 Other long term (current) drug therapy: Secondary | ICD-10-CM

## 2020-05-29 DIAGNOSIS — M1A09X0 Idiopathic chronic gout, multiple sites, without tophus (tophi): Secondary | ICD-10-CM

## 2020-05-29 NOTE — Telephone Encounter (Signed)
Lab Orders released.  

## 2020-05-29 NOTE — Telephone Encounter (Signed)
[employer] Auth for CT Scan : [id number]

## 2020-05-29 NOTE — Telephone Encounter (Signed)
Patient called requesting labwork orders be sent to [facility] in [place].  Patient will be going "sometime this week."

## 2020-06-01 ENCOUNTER — Ambulatory Visit (HOSPITAL_COMMUNITY)
Admission: RE | Admit: 2020-06-01 | Discharge: 2020-06-01 | Disposition: A | Discharge status: Home / Self Care | Payer: Medicare HMO | Source: Ambulatory Visit | Attending: Internal Medicine | Admitting: Internal Medicine

## 2020-06-01 ENCOUNTER — Other Ambulatory Visit: Payer: Self-pay

## 2020-06-01 DIAGNOSIS — R079 Chest pain, unspecified: Secondary | ICD-10-CM | POA: Insufficient documentation

## 2020-06-01 DIAGNOSIS — M1A09X0 Idiopathic chronic gout, multiple sites, without tophus (tophi): Secondary | ICD-10-CM | POA: Diagnosis not present

## 2020-06-01 DIAGNOSIS — Z79899 Other long term (current) drug therapy: Secondary | ICD-10-CM | POA: Diagnosis not present

## 2020-06-02 LAB — CBC WITH DIFFERENTIAL/PLATELET
Absolute Monocytes: 876 cells/uL (ref 200–950)
Basophils Absolute: 38 cells/uL (ref 0–200)
Basophils Relative: 0.6 %
Eosinophils Absolute: 416 cells/uL (ref 15–500)
Eosinophils Relative: 6.6 %
HCT: 39 % (ref 38.5–50.0)
Hemoglobin: 12.6 g/dL — ABNORMAL LOW (ref 13.2–17.1)
Lymphs Abs: 914 cells/uL (ref 850–3900)
MCH: 27.3 pg (ref 27.0–33.0)
MCHC: 32.3 g/dL (ref 32.0–36.0)
MCV: 84.6 fL (ref 80.0–100.0)
MPV: 12.4 fL (ref 7.5–12.5)
Monocytes Relative: 13.9 %
Neutro Abs: 4057 cells/uL (ref 1500–7800)
Neutrophils Relative %: 64.4 %
Platelets: 181 10*3/uL (ref 140–400)
RBC: 4.61 10*6/uL (ref 4.20–5.80)
RDW: 13.6 % (ref 11.0–15.0)
Total Lymphocyte: 14.5 %
WBC: 6.3 10*3/uL (ref 3.8–10.8)

## 2020-06-02 LAB — COMPLETE METABOLIC PANEL WITH GFR
AG Ratio: 1.2 (calc) (ref 1.0–2.5)
ALT: 18 U/L (ref 9–46)
AST: 16 U/L (ref 10–35)
Albumin: 3.6 g/dL (ref 3.6–5.1)
Alkaline phosphatase (APISO): 124 U/L (ref 35–144)
BUN/Creatinine Ratio: 12 (calc) (ref 6–22)
BUN: 25 mg/dL (ref 7–25)
CO2: 30 mmol/L (ref 20–32)
Calcium: 8.9 mg/dL (ref 8.6–10.3)
Chloride: 102 mmol/L (ref 98–110)
Creat: 2.06 mg/dL — ABNORMAL HIGH (ref 0.70–1.18)
GFR, Est African American: 35 mL/min/{1.73_m2} — ABNORMAL LOW (ref >=60)
GFR, Est Non African American: 31 mL/min/{1.73_m2} — ABNORMAL LOW (ref >=60)
Globulin: 2.9 g/dL (calc) (ref 1.9–3.7)
Glucose, Bld: 130 mg/dL (ref 65–139)
Potassium: 4.1 mmol/L (ref 3.5–5.3)
Sodium: 138 mmol/L (ref 135–146)
Total Bilirubin: 0.4 mg/dL (ref 0.2–1.2)
Total Protein: 6.5 g/dL (ref 6.1–8.1)

## 2020-06-02 LAB — URIC ACID: Uric Acid, Serum: 5.7 mg/dL (ref 4.0–8.0)

## 2020-06-02 NOTE — Progress Notes (Signed)
Uric acid is normal.

## 2020-06-02 NOTE — Progress Notes (Signed)
CBC and CMP are stable.

## 2020-06-06 ENCOUNTER — Encounter (INDEPENDENT_AMBULATORY_CARE_PROVIDER_SITE_OTHER): Payer: Self-pay | Admitting: Internal Medicine

## 2020-06-06 ENCOUNTER — Other Ambulatory Visit: Payer: Self-pay

## 2020-06-06 ENCOUNTER — Ambulatory Visit (INDEPENDENT_AMBULATORY_CARE_PROVIDER_SITE_OTHER): Payer: Medicare HMO | Admitting: Internal Medicine

## 2020-06-06 VITALS — BP 132/80 | HR 85 | Temp 97.9°F | Resp 18 | Ht 70.0 in | Wt 184.6 lb

## 2020-06-06 DIAGNOSIS — R079 Chest pain, unspecified: Secondary | ICD-10-CM

## 2020-06-06 DIAGNOSIS — I10 Essential (primary) hypertension: Secondary | ICD-10-CM | POA: Diagnosis not present

## 2020-06-06 DIAGNOSIS — E559 Vitamin D deficiency, unspecified: Secondary | ICD-10-CM

## 2020-06-06 NOTE — Progress Notes (Signed)
Metrics: Intervention Frequency ACO  Documented Smoking Status Yearly  Screened one or more times in 24 months  Cessation Counseling or  Active cessation medication Past 24 months  Past 24 months   Guideline developer: UpToDate (See UpToDate for funding source) Date Released: 2014       Wellness Office Visit  Subjective:  Patient ID: Jacob Farmer, male    DOB: [birth year]  Age: 74 y.o. MRN: [id number]  CC: This man comes in for follow-up of chest pain. HPI CT scan was done which does not show any major findings.  He has calcification of coronary arteries which is not surprising based on his history of coronary artery disease but his lung fields appear to be unremarkable with some scar tissue in the left upper lobe. He continues to have the sharp stabbing pain which is admittedly better than it was when it started about 2 months ago.  He has never had a rash typical of shingles.  Tramadol does seem to be helping him. He also has seronegative rheumatoid arthritis and he has an appointment with a rheumatologist not till the end of September.  He also has chronic kidney disease. He also has a history of vitamin D deficiency.  He is not sure of the dose of vitamin the supplementation that he is actually taking.  Past Medical History:  Diagnosis Date  . Arteriosclerotic cardiovascular disease (ASCVD) cardiologist-  dr Roderic Palau branch   Acute myocardial infarction treated with TPA in 1990; 1999-stents to circumflex and RCA; residual total occlusion of the left anterior descending; normal ejection fraction; stress nuclear in 2001-distal anteroseptal ischemia; normal LV function  . Chronic kidney disease, stage 3, mod decreased GFR nephrologist-  at [facility] in [place]   Creatinine-2.18 and 12/2007, 1.76 and 12/2008  . First degree heart block   . History of gastric ulcer   . History of kidney stones    05/23/17- small = no pain- watching  . History of MI (myocardial infarction)    1990-   treated w/ TPA  . Hyperlipidemia   . Hypertension   . Myocardial infarction ([place]) 1990  . OA (osteoarthritis)   . Pre-diabetes   . Prediabetes   . Prostate cancer [facility]) urologist-- dr dahlstedt/  oncologist- dr Tammi Klippel   Stage T1c , Gleason 3+4,  PSA 4.7,  vol 24cc--  scheduled for radiative seed implants  . Psoriatic arthritis ([place])   . RA (rheumatoid arthritis) ([place])    rheumologist-  dr Toni Amend  . S/P drug eluting coronary stent placement    1999--  DES x2 to CFX and RCA  . Sinus bradycardia   . Wears dentures    upper and lower partial   Past Surgical History:  Procedure Laterality Date  . BIOPSY  08/27/2019   Procedure: BIOPSY;  Surgeon: Rogene Houston, MD;  [facility];  Service: Endoscopy;;  gastric  . CARDIOVASCULAR STRESS TEST  2001  per Dr Lattie Haw clinic note   distal anteroseptal ischemia,  normal LVF  . COLONOSCOPY  last one 2006 (approx)  . CORONARY ANGIOPLASTY WITH STENT PLACEMENT  1999   DES x2  to CFX and RCA/  residual total occlusion LAD,  normal LVEF  . ESOPHAGOGASTRODUODENOSCOPY (EGD) WITH PROPOFOL N/A 08/27/2019   Procedure: ESOPHAGOGASTRODUODENOSCOPY (EGD) WITH PROPOFOL;  Surgeon: Rogene Houston, MD;  [facility];  Service: Endoscopy;  Laterality: N/A;  10:10  . EYE SURGERY Bilateral    cataract  . POSTERIOR CERVICAL FUSION/FORAMINOTOMY N/A  05/26/2017   Procedure: RIGHT C4-5 FORAMINOTOMY WITH EXCISION OF HERNIATED [place];  Surgeon: Jessy Oto, MD;  Location: [facility];  Service: Orthopedics;  Laterality: N/A;  . RADIOACTIVE SEED IMPLANT N/A 01/11/2016   Procedure: RADIOACTIVE SEED IMPLANT/BRACHYTHERAPY IMPLANT;  Surgeon: Franchot Gallo, MD;  Location: [facility];  Service: Urology;  Laterality: N/A;   77  seeds implanted no seeds founds in bladder     Family History  Problem Relation Age of Onset  . Heart attack Mother   . Coronary artery disease Father   . Ulcerative colitis Father   . Ulcers Father   .  Breast cancer Sister   . COPD Brother   . Pancreatic cancer Brother   . Healthy Sister   . Diabetes Brother   . Cirrhosis Son   . 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   Social History   Tobacco Use  . Smoking status: Former Smoker    Packs/day: 1.00    Years: 30.00    Pack years: 30.00    Types: Cigarettes    Quit date: 05/21/1989    Years since quitting: 31.0  . Smokeless tobacco: Never Used  Substance Use Topics  . Alcohol use: Yes    Alcohol/week: 20.0 standard drinks    Types: 10 Shots of liquor, 10 Standard drinks or equivalent per week    Comment: 2-3 oz per day    Current Meds  Medication Sig  . allopurinol (ZYLOPRIM) 100 MG tablet TAKE 1/2 TABLET (50 MG TOTAL) BY MOUTH DAILY.  Marland Kitchen amLODipine (NORVASC) 10 MG tablet Take 1 tablet (10 mg total) by mouth daily.  Marland Kitchen aspirin 81 MG tablet Take 1 tablet (81 mg total) by mouth 3 (three) times a week.  Marland Kitchen atorvastatin (LIPITOR) 80 MG tablet Take 1 tablet (80 mg total) by mouth every evening.  Marland Kitchen augmented betamethasone dipropionate (DIPROLENE-AF) 0.05 % cream   . cholecalciferol (VITAMIN D3) 25 MCG (1000 UNIT) tablet Take 1,000 Units by mouth daily.  . folic acid (FOLVITE) 294 MCG tablet Take 400 mcg by mouth daily.  . hydrALAZINE (APRESOLINE) 25 MG tablet Take by mouth.  . leflunomide (ARAVA) 10 MG tablet Take 1 tablet (10 mg total) by mouth daily.  . metoprolol tartrate (LOPRESSOR) 50 MG tablet Take 1 tablet (50 mg total) by mouth 2 (two) times daily.  . nitroGLYCERIN (NITROSTAT) 0.4 MG SL tablet Place 0.4 mg under the tongue every 5 (five) minutes as needed for chest pain.   . pantoprazole (PROTONIX) 40 MG tablet Take 1 tablet (40 mg total) by mouth daily with breakfast.  . tacrolimus (PROTOPIC) 0.1 % ointment   . traMADol (ULTRAM) 50 MG tablet Take 1 tablet (50 mg total) by mouth every 8 (eight) hours as needed.  . triamcinolone cream (KENALOG) 0.1 % Apply 1  application topically 3 (three) times daily as needed (itching).        Depression screen [facility] 2/9 03/28/2020 09/28/2019 09/01/2019 07/27/2019  Decreased Interest 0 0 0 0  Down, Depressed, Hopeless 0 0 0 -  PHQ - 2 Score 0 0 0 0     Objective:   Today's Vitals: BP 132/80 (BP Location: Right Arm, Patient Position: Sitting, Cuff Size: Normal)   Pulse 85   Temp 97.9 F (36.6 C) (Temporal)   Resp 18   Ht 5\' 10"  (1.778 m)   Wt 184 lb 9.6 oz (83.7 kg)  SpO2 98%   BMI 26.49 kg/m  Vitals with BMI 06/06/2020 05/08/2020 04/14/2020  Height 5\' 10"  5\' 10"  5\' 10"   Weight 184 lbs 10 oz 185 lbs 187 lbs  BMI 26.49 67.01 10.03  Systolic 496 116 435  Diastolic 80 80 85  Pulse 85 80 60     Physical Exam  He looks systemically well and does not appear to be in any pain.  Blood pressure has improved since the last visit.  No other new physical findings.     Assessment   1. Vitamin D deficiency disease   2. Chest pain, unspecified type   3. Essential hypertension       Tests ordered Orders Placed This Encounter  Procedures  . COMPLETE METABOLIC PANEL WITH GFR  . VITAMIN D 25 Hydroxy (Vit-D Deficiency, Fractures)     Plan: 1. I wonder what his vitamin D levels are and if we optimize vitamin D levels, it is possible he may feel better. 2. He is due to see cardiology regarding follow-up and I have asked him to make sure a mentions his chest pain symptoms. 3. He will also seek a rheumatology appointment earlier than the end of September for further evaluation. 4. Further recommendations will depend on blood results and I will see him in early August when he was previously scheduled to see me.   No orders of the defined types were placed in this encounter.   Doree Albee, MD

## 2020-06-07 LAB — COMPLETE METABOLIC PANEL WITH GFR
AG Ratio: 1.4 (calc) (ref 1.0–2.5)
ALT: 16 U/L (ref 9–46)
AST: 17 U/L (ref 10–35)
Albumin: 3.8 g/dL (ref 3.6–5.1)
Alkaline phosphatase (APISO): 118 U/L (ref 35–144)
BUN/Creatinine Ratio: 10 (calc) (ref 6–22)
BUN: 26 mg/dL — ABNORMAL HIGH (ref 7–25)
CO2: 24 mmol/L (ref 20–32)
Calcium: 9.1 mg/dL (ref 8.6–10.3)
Chloride: 103 mmol/L (ref 98–110)
Creat: 2.66 mg/dL — ABNORMAL HIGH (ref 0.70–1.18)
GFR, Est African American: 26 mL/min/{1.73_m2} — ABNORMAL LOW (ref >=60)
GFR, Est Non African American: 22 mL/min/{1.73_m2} — ABNORMAL LOW (ref >=60)
Globulin: 2.8 g/dL (calc) (ref 1.9–3.7)
Glucose, Bld: 111 mg/dL — ABNORMAL HIGH (ref 65–99)
Potassium: 4.3 mmol/L (ref 3.5–5.3)
Sodium: 140 mmol/L (ref 135–146)
Total Bilirubin: 0.5 mg/dL (ref 0.2–1.2)
Total Protein: 6.6 g/dL (ref 6.1–8.1)

## 2020-06-07 LAB — VITAMIN D 25 HYDROXY (VIT D DEFICIENCY, FRACTURES): Vit D, 25-Hydroxy: 43 ng/mL (ref 30–100)

## 2020-06-07 NOTE — Progress Notes (Signed)
Please call this patient.  Tell him that the vitamin D levels are still not optimal.  He should be taking vitamin D3 10,000 units daily.  Verify the dose that he is taking and then advise him accordingly.His kidney function is slightly worse so he should drink more water every day.  I think this will make him feel better also.Follow-up as scheduled.

## 2020-06-07 NOTE — Progress Notes (Signed)
Patient called.  Pt lab results was given to the wife, she will get the Vitamin D3 to start taking.

## 2020-06-08 NOTE — Telephone Encounter (Signed)
Pt wife given report and results on 06/07/2020.

## 2020-06-23 DIAGNOSIS — H524 Presbyopia: Secondary | ICD-10-CM | POA: Diagnosis not present

## 2020-06-23 DIAGNOSIS — Z0100 Encounter for examination of eyes and vision without abnormal findings: Secondary | ICD-10-CM | POA: Diagnosis not present

## 2020-06-28 ENCOUNTER — Other Ambulatory Visit (INDEPENDENT_AMBULATORY_CARE_PROVIDER_SITE_OTHER): Payer: Self-pay

## 2020-06-28 MED ORDER — TRAMADOL HCL 50 MG PO TABS: 50.0000 mg | ORAL_TABLET | Freq: Three times a day (TID) | ORAL | 0 refills | Status: DC | PRN

## 2020-06-29 ENCOUNTER — Telehealth (INDEPENDENT_AMBULATORY_CARE_PROVIDER_SITE_OTHER): Payer: Self-pay

## 2020-06-29 ENCOUNTER — Other Ambulatory Visit (INDEPENDENT_AMBULATORY_CARE_PROVIDER_SITE_OTHER): Payer: Self-pay | Admitting: Internal Medicine

## 2020-06-29 MED ORDER — TRAMADOL HCL 50 MG PO TABS: 50.0000 mg | ORAL_TABLET | Freq: Three times a day (TID) | ORAL | 2 refills | Status: DC | PRN

## 2020-06-29 NOTE — Telephone Encounter (Signed)
What medication

## 2020-06-29 NOTE — Telephone Encounter (Signed)
Tramadol to [facility]

## 2020-07-03 ENCOUNTER — Telehealth: Payer: Self-pay | Admitting: Dermatology

## 2020-07-03 ENCOUNTER — Telehealth: Payer: Self-pay

## 2020-07-03 NOTE — Telephone Encounter (Signed)
Fax received from [place] patient assistance program stating that the patient has been approved for the [place] [employer] program through 12/15/2020.

## 2020-07-03 NOTE — Telephone Encounter (Signed)
Patient mad visit for Friday for dupixent training 7/23

## 2020-07-03 NOTE — Telephone Encounter (Signed)
Patient received notification from pharmacy that dupixent was shipping out to him. He was told to call and schedule nurse visit for first injection. When does this patient need to schedule?

## 2020-07-07 ENCOUNTER — Ambulatory Visit (INDEPENDENT_AMBULATORY_CARE_PROVIDER_SITE_OTHER): Payer: Medicare HMO | Admitting: *Deleted

## 2020-07-07 ENCOUNTER — Other Ambulatory Visit: Payer: Self-pay

## 2020-07-07 DIAGNOSIS — L209 Atopic dermatitis, unspecified: Secondary | ICD-10-CM

## 2020-07-07 MED ORDER — DUPILUMAB 300 MG/2ML ~~LOC~~ SOSY
600.0000 mg | PREFILLED_SYRINGE | Freq: Once | SUBCUTANEOUS | Status: AC
  Administered 2020-07-07: 600 mg via SUBCUTANEOUS

## 2020-07-07 NOTE — Progress Notes (Signed)
Here for [place] training. Gave patient his initial dose.   Right abdomen-300 mg  Left abdomen-300 mg  Patient tolerated well. No problems or concerns.   Lot #-[id number] Exp- 2022-01-15 [id number]  Patient will follow up with Dr Denna Haggard in 3-4 months to check progress.

## 2020-07-17 ENCOUNTER — Other Ambulatory Visit: Payer: Self-pay | Admitting: Family Medicine

## 2020-07-18 ENCOUNTER — Ambulatory Visit: Payer: Medicare HMO | Admitting: Cardiology

## 2020-07-18 ENCOUNTER — Encounter: Payer: Self-pay | Admitting: Cardiology

## 2020-07-18 VITALS — BP 160/72 | HR 48 | Ht 70.0 in | Wt 188.0 lb

## 2020-07-18 DIAGNOSIS — R001 Bradycardia, unspecified: Secondary | ICD-10-CM

## 2020-07-18 DIAGNOSIS — I2510 Atherosclerotic heart disease of native coronary artery without angina pectoris: Secondary | ICD-10-CM | POA: Diagnosis not present

## 2020-07-18 DIAGNOSIS — I10 Essential (primary) hypertension: Secondary | ICD-10-CM | POA: Diagnosis not present

## 2020-07-18 DIAGNOSIS — E782 Mixed hyperlipidemia: Secondary | ICD-10-CM | POA: Diagnosis not present

## 2020-07-18 MED ORDER — TORSEMIDE 20 MG PO TABS
20.0000 mg | ORAL_TABLET | ORAL | 1 refills | Status: DC | PRN
Start: 2020-07-18 — End: 2020-07-19

## 2020-07-18 MED ORDER — ROSUVASTATIN CALCIUM 40 MG PO TABS: 40.0000 mg | ORAL_TABLET | Freq: Every day | ORAL | 1 refills | Status: DC

## 2020-07-18 MED ORDER — METOPROLOL TARTRATE 25 MG PO TABS
25.0000 mg | ORAL_TABLET | Freq: Two times a day (BID) | ORAL | 1 refills | Status: DC
Start: 2020-07-18 — End: 2020-09-01

## 2020-07-18 NOTE — Patient Instructions (Signed)
Your physician recommends that you schedule a follow-up appointment in: 2 [place] has recommended you make the following change in your medication:   STOP ATORVASTATIN   DECREASE METOPROLOL 25 MG TWICE DAILY   START CRESTOR 40 MG DAILY   Your physician recommends that you return for lab work in: [place] BMP/MG/TSH  Your physician has requested that you have an echocardiogram. Echocardiography is a painless test that uses sound waves to create images of your heart. It provides your doctor with information about the size and shape of your heart and how well your hearts chambers and valves are working. This procedure takes approximately one hour. There are no restrictions for this procedure.  Thank you for choosing [facility]!!

## 2020-07-18 NOTE — Progress Notes (Signed)
Clinical Summary Mr. Grainger is a 74 y.o.male seen today for follow up of the following medical problems.  1. CAD - multiple interventions as described below - normal LV function per prior clinic notes, there is no documented study in our sytem    - describes aching pain bewteen shoulder blades, right axilla. Tends to occur at night. Better with position.No exertional chest pains.  - compliatn with meds    2. HTN  - started on hydralazine by nephorlogy - followed by Dr Theador Hawthorne, has follow this week  - home bp's usually 120s-140s/70s   3. Hyperlipidemia  - 06/2019 TC 129 TG 140 HDL 31 LDL 76 04/2020 TC 141 HDL 35 TG 136 LDL 83 - compliant with statin   4. CKDIII - followed by renalDr Theador Hawthorne  5. Prostate CA - had radioactive seeds placed, followed by urology.    6. Rheumatoid arthritis - followed by rheum   7. Sinus bradycardia  rare dizzienss     Past Medical History:  Diagnosis Date  . Arteriosclerotic cardiovascular disease (ASCVD) cardiologist-  dr Roderic Palau Jasira Robinson   Acute myocardial infarction treated with TPA in 1990; 1999-stents to circumflex and RCA; residual total occlusion of the left anterior descending; normal ejection fraction; stress nuclear in 2001-distal anteroseptal ischemia; normal LV function  . Chronic kidney disease, stage 3, mod decreased GFR nephrologist-  at [facility] in [place]   Creatinine-2.18 and 12/2007, 1.76 and 12/2008  . First degree heart block   . History of gastric ulcer   . History of kidney stones    05/23/17- small = no pain- watching  . History of MI (myocardial infarction)    1990-  treated w/ TPA  . Hyperlipidemia   . Hypertension   . Myocardial infarction ([place]) 1990  . OA (osteoarthritis)   . Pre-diabetes   . Prediabetes   . Prostate cancer [facility]) urologist-- dr dahlstedt/  oncologist- dr Tammi Klippel   Stage T1c , Gleason 3+4,  PSA 4.7,  vol 24cc--  scheduled for radiative seed implants  .  Psoriatic arthritis ([place])   . RA (rheumatoid arthritis) ([place])    rheumologist-  dr Toni Amend  . S/P drug eluting coronary stent placement    1999--  DES x2 to CFX and RCA  . Sinus bradycardia   . Wears dentures    upper and lower partial     No Known Allergies   Current Outpatient Medications  Medication Sig Dispense Refill  . allopurinol (ZYLOPRIM) 100 MG tablet TAKE 1/2 TABLET (50 MG TOTAL) BY MOUTH DAILY. 45 tablet 0  . amLODipine (NORVASC) 10 MG tablet Take 1 tablet (10 mg total) by mouth daily. 90 tablet 1  . aspirin 81 MG tablet Take 1 tablet (81 mg total) by mouth 3 (three) times a week. 30 tablet   . atorvastatin (LIPITOR) 80 MG tablet Take 1 tablet (80 mg total) by mouth every evening. 90 tablet 1  . augmented betamethasone dipropionate (DIPROLENE-AF) 0.05 % cream     . cholecalciferol (VITAMIN D3) 25 MCG (1000 UNIT) tablet Take 1,000 Units by mouth daily.    . folic acid (FOLVITE) 740 MCG tablet Take 400 mcg by mouth daily.    . hydrALAZINE (APRESOLINE) 25 MG tablet Take by mouth.    . leflunomide (ARAVA) 10 MG tablet Take 1 tablet (10 mg total) by mouth daily. 90 tablet 0  . metoprolol tartrate (LOPRESSOR) 50 MG tablet Take 1 tablet (50 mg total) by mouth 2 (two) times daily.  90 tablet 1  . nitroGLYCERIN (NITROSTAT) 0.4 MG SL tablet Place 0.4 mg under the tongue every 5 (five) minutes as needed for chest pain.     . pantoprazole (PROTONIX) 40 MG tablet Take 1 tablet (40 mg total) by mouth daily with breakfast. 90 tablet 1  . tacrolimus (PROTOPIC) 0.1 % ointment     . traMADol (ULTRAM) 50 MG tablet Take 1 tablet (50 mg total) by mouth every 8 (eight) hours as needed. 30 tablet 2  . triamcinolone cream (KENALOG) 0.1 % Apply 1 application topically 3 (three) times daily as needed (itching).      No current facility-administered medications for this visit.     Past Surgical History:  Procedure Laterality Date  . BIOPSY  08/27/2019   Procedure: BIOPSY;  Surgeon: Rogene Houston, MD;  [facility];  Service: Endoscopy;;  gastric  . CARDIOVASCULAR STRESS TEST  2001  per Dr Lattie Haw clinic note   distal anteroseptal ischemia,  normal LVF  . COLONOSCOPY  last one 2006 (approx)  . CORONARY ANGIOPLASTY WITH STENT PLACEMENT  1999   DES x2  to CFX and RCA/  residual total occlusion LAD,  normal LVEF  . ESOPHAGOGASTRODUODENOSCOPY (EGD) WITH PROPOFOL N/A 08/27/2019   Procedure: ESOPHAGOGASTRODUODENOSCOPY (EGD) WITH PROPOFOL;  Surgeon: Rogene Houston, MD;  [facility];  Service: Endoscopy;  Laterality: N/A;  10:10  . EYE SURGERY Bilateral    cataract  . POSTERIOR CERVICAL FUSION/FORAMINOTOMY N/A 05/26/2017   Procedure: RIGHT C4-5 FORAMINOTOMY WITH EXCISION OF HERNIATED [place];  Surgeon: Jessy Oto, MD;  Location: [facility];  Service: Orthopedics;  Laterality: N/A;  . RADIOACTIVE SEED IMPLANT N/A 01/11/2016   Procedure: RADIOACTIVE SEED IMPLANT/BRACHYTHERAPY IMPLANT;  Surgeon: Franchot Gallo, MD;  [facility];  Service: Urology;  Laterality: N/A;   73  seeds implanted no seeds founds in bladder     No Known Allergies    Family History  Problem Relation Age of Onset  . Heart attack Mother   . Coronary artery disease Father   . Ulcerative colitis Father   . Ulcers Father   . Breast cancer Sister   . COPD Brother   . Pancreatic cancer Brother   . Healthy Sister   . Diabetes Brother   . Cirrhosis Son   . Seizures Son      Social History Mr. Donley reports that he quit smoking about 31 years ago. His smoking use included cigarettes. He has a 30.00 pack-year smoking history. He has never used smokeless tobacco. Mr. Hudgins reports current alcohol use of about 20.0 standard drinks of alcohol per week.   Review of Systems CONSTITUTIONAL: No weight loss, fever, chills, weakness or fatigue.  HEENT: Eyes: No visual loss, blurred vision, double vision or yellow sclerae.No hearing loss, sneezing, congestion,  runny nose or sore throat.  SKIN: No rash or itching.  CARDIOVASCULAR: per hpi RESPIRATORY: per hpi GASTROINTESTINAL: No anorexia, nausea, vomiting or diarrhea. No abdominal pain or blood.  GENITOURINARY: No burning on urination, no polyuria NEUROLOGICAL: No headache, dizziness, syncope, paralysis, ataxia, numbness or tingling in the extremities. No change in bowel or bladder control.  MUSCULOSKELETAL: No muscle, back pain, joint pain or stiffness.  LYMPHATICS: No enlarged nodes. No history of splenectomy.  PSYCHIATRIC: No history of depression or anxiety.  ENDOCRINOLOGIC: No reports of sweating, cold or heat intolerance. No polyuria or polydipsia.  Marland Kitchen   Physical Examination Today's Vitals   07/18/20 1049  BP: Marland Kitchen)  160/72  Pulse: (!) 48  SpO2: 98%  Weight: 188 lb (85.3 kg)  Height: 5\' 10"  (1.778 m)   Body mass index is 26.98 kg/m.  Gen: resting comfortably, no acute distress HEENT: no scleral icterus, pupils equal round and reactive, no palptable cervical adenopathy,  CV: RRR< no m/r/g, no jvd Resp: Clear to auscultation bilaterally GI: abdomen is soft, non-tender, non-distended, normal bowel sounds, no hepatosplenomegaly MSK: extremities are warm, no edema.  Skin: warm, no rash Neuro:  no focal deficits Psych: appropriate affect    Assessment and Plan    1. CAD - no symptoms, contine current meds  2. HTN - home bp's tend to run better than clinic numbers and have been at goal, - continue current meds  3. Hyperlipidemia - LDL not at goal <70, we will change to crestor 40mg  daily  4. Bradycardia - lower metoprolol to 25mg  bid     Arnoldo Lenis, M.D.

## 2020-07-19 ENCOUNTER — Ambulatory Visit: Payer: Medicare HMO | Admitting: Cardiology

## 2020-07-19 ENCOUNTER — Other Ambulatory Visit: Payer: Self-pay | Admitting: *Deleted

## 2020-07-19 MED ORDER — TORSEMIDE 20 MG PO TABS: 20.0000 mg | ORAL_TABLET | Freq: Every day | ORAL | 1 refills | Status: DC | PRN

## 2020-07-20 ENCOUNTER — Other Ambulatory Visit: Payer: Self-pay

## 2020-07-20 ENCOUNTER — Ambulatory Visit (HOSPITAL_COMMUNITY)
Admission: RE | Admit: 2020-07-20 | Discharge: 2020-07-20 | Disposition: A | Discharge status: Home / Self Care | Payer: Medicare HMO | Source: Ambulatory Visit | Attending: Cardiology | Admitting: Cardiology

## 2020-07-20 DIAGNOSIS — R809 Proteinuria, unspecified: Secondary | ICD-10-CM | POA: Diagnosis not present

## 2020-07-20 DIAGNOSIS — Z79899 Other long term (current) drug therapy: Secondary | ICD-10-CM | POA: Diagnosis not present

## 2020-07-20 DIAGNOSIS — I2510 Atherosclerotic heart disease of native coronary artery without angina pectoris: Secondary | ICD-10-CM | POA: Diagnosis not present

## 2020-07-20 DIAGNOSIS — I129 Hypertensive chronic kidney disease with stage 1 through stage 4 chronic kidney disease, or unspecified chronic kidney disease: Secondary | ICD-10-CM | POA: Diagnosis not present

## 2020-07-20 DIAGNOSIS — N184 Chronic kidney disease, stage 4 (severe): Secondary | ICD-10-CM | POA: Diagnosis not present

## 2020-07-20 DIAGNOSIS — E211 Secondary hyperparathyroidism, not elsewhere classified: Secondary | ICD-10-CM | POA: Diagnosis not present

## 2020-07-20 DIAGNOSIS — N189 Chronic kidney disease, unspecified: Secondary | ICD-10-CM | POA: Diagnosis not present

## 2020-07-20 DIAGNOSIS — D631 Anemia in chronic kidney disease: Secondary | ICD-10-CM | POA: Diagnosis not present

## 2020-07-20 LAB — ECHOCARDIOGRAM COMPLETE
AR max vel: 3.27 cm2
AV Area VTI: 3.23 cm2
AV Area mean vel: 2.98 cm2
AV Mean grad: 3.2 mmHg
AV Peak grad: 6 mmHg
Ao pk vel: 1.22 m/s
Area-P 1/2: 4.49 cm2
S' Lateral: 3.35 cm

## 2020-07-20 NOTE — Progress Notes (Signed)
*  PRELIMINARY RESULTS* Echocardiogram 2D Echocardiogram has been performed.  Samuel Germany 07/20/2020, 2:26 PM

## 2020-07-24 ENCOUNTER — Other Ambulatory Visit: Payer: Self-pay

## 2020-07-24 ENCOUNTER — Encounter (INDEPENDENT_AMBULATORY_CARE_PROVIDER_SITE_OTHER): Payer: Self-pay | Admitting: Internal Medicine

## 2020-07-24 ENCOUNTER — Ambulatory Visit (INDEPENDENT_AMBULATORY_CARE_PROVIDER_SITE_OTHER): Payer: Medicare HMO | Admitting: Internal Medicine

## 2020-07-24 VITALS — BP 160/80 | HR 83 | Temp 97.7°F | Ht 70.0 in | Wt 185.2 lb

## 2020-07-24 DIAGNOSIS — I10 Essential (primary) hypertension: Secondary | ICD-10-CM

## 2020-07-24 DIAGNOSIS — E1100 Type 2 diabetes mellitus with hyperosmolarity without nonketotic hyperglycemic-hyperosmolar coma (NKHHC): Secondary | ICD-10-CM

## 2020-07-24 DIAGNOSIS — N1831 Chronic kidney disease, stage 3a: Secondary | ICD-10-CM

## 2020-07-24 DIAGNOSIS — E782 Mixed hyperlipidemia: Secondary | ICD-10-CM | POA: Diagnosis not present

## 2020-07-24 NOTE — Progress Notes (Signed)
Metrics: Intervention Frequency ACO  Documented Smoking Status Yearly  Screened one or more times in 24 months  Cessation Counseling or  Active cessation medication Past 24 months  Past 24 months   Guideline developer: UpToDate (See UpToDate for funding source) Date Released: 2014       Wellness Office Visit  Subjective:  Patient ID: Jacob Farmer, male    DOB: 09/13/1946  Age: 74 y.o. MRN: [id number]  CC: This man comes in for follow-up of hypertension, diabetes, hyperlipidemia and chronic kidney disease. HPI  He is due to follow-up with nephrology in the next few days. As far as his diabetes is concerned, he is on no medication and is diet controlled.  His last hemoglobin A1c was less than 5.7%. He continues with amlodipine, hydralazine, metoprolol for his hypertension and coronary artery protection. Past Medical History:  Diagnosis Date  . Arteriosclerotic cardiovascular disease (ASCVD) cardiologist-  dr Roderic [facility] [facility]   Acute myocardial infarction treated with TPA in 1990; 1999-stents to circumflex and RCA; residual total occlusion of the left anterior descending; normal ejection fraction; stress nuclear in 2001-distal anteroseptal ischemia; normal LV function  . Chronic kidney disease, stage 3, mod decreased GFR nephrologist-  at [facility] in [place]   Creatinine-2.18 and 12/2007, 1.76 and 12/2008  . First degree heart block   . History of gastric ulcer   . History of kidney stones    05/23/17- small = no pain- watching  . History of MI (myocardial infarction)    1990-  treated w/ TPA  . Hyperlipidemia   . Hypertension   . Myocardial infarction ([place]) 1990  . OA (osteoarthritis)   . Pre-diabetes   . Prediabetes   . Prostate cancer [facility]) urologist-- dr dahlstedt/  oncologist- dr Tammi Klippel   Stage T1c , Gleason 3+4,  PSA 4.7,  vol 24cc--  scheduled for radiative seed implants  . Psoriatic arthritis ([place])   . RA (rheumatoid arthritis) ([place])    rheumologist-  dr Toni Amend   . S/P drug eluting coronary stent placement    1999--  DES x2 to CFX and RCA  . Sinus bradycardia   . Wears dentures    upper and lower partial   Past Surgical History:  Procedure Laterality Date  . BIOPSY  08/27/2019   Procedure: BIOPSY;  Surgeon: Rogene Houston, MD;  [facility];  Service: Endoscopy;;  gastric  . CARDIOVASCULAR STRESS TEST  2001  per Dr Lattie Haw clinic note   distal anteroseptal ischemia,  normal LVF  . COLONOSCOPY  last one 2006 (approx)  . CORONARY ANGIOPLASTY WITH STENT PLACEMENT  1999   DES x2  to CFX and RCA/  residual total occlusion LAD,  normal LVEF  . ESOPHAGOGASTRODUODENOSCOPY (EGD) WITH PROPOFOL N/A 08/27/2019   Procedure: ESOPHAGOGASTRODUODENOSCOPY (EGD) WITH PROPOFOL;  Surgeon: Rogene Houston, MD;  [facility];  Service: Endoscopy;  Laterality: N/A;  10:10  . EYE SURGERY Bilateral    cataract  . POSTERIOR CERVICAL FUSION/FORAMINOTOMY N/A 05/26/2017   Procedure: RIGHT C4-5 FORAMINOTOMY WITH EXCISION OF HERNIATED [place];  Surgeon: Jessy Oto, MD;  Location: [facility];  Service: Orthopedics;  Laterality: N/A;  . RADIOACTIVE SEED IMPLANT N/A 01/11/2016   Procedure: RADIOACTIVE SEED IMPLANT/BRACHYTHERAPY IMPLANT;  Surgeon: Franchot Gallo, MD;  Location: [facility];  Service: Urology;  Laterality: N/A;   31  seeds implanted no seeds founds in bladder     Family History  Problem Relation Age of Onset  . Heart  attack Mother   . Coronary artery disease Father   . Ulcerative colitis Father   . Ulcers Father   . Breast cancer Sister   . COPD Brother   . Pancreatic cancer Brother   . Healthy Sister   . Diabetes Brother   . Cirrhosis Son   . 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   Social History   Tobacco Use  . Smoking status: Former Smoker    Packs/day: 1.00    Years: 30.00    Pack years: 30.00    Types: Cigarettes    Quit date:  05/21/1989    Years since quitting: 31.1  . Smokeless tobacco: Never Used  Substance Use Topics  . Alcohol use: Yes    Alcohol/week: 20.0 standard drinks    Types: 10 Shots of liquor, 10 Standard drinks or equivalent per week    Comment: 2-3 oz per day    Current Meds  Medication Sig  . allopurinol (ZYLOPRIM) 100 MG tablet TAKE 1/2 TABLET (50 MG TOTAL) BY MOUTH DAILY.  Marland Kitchen amLODipine (NORVASC) 10 MG tablet Take 1 tablet (10 mg total) by mouth daily.  Marland Kitchen aspirin 81 MG tablet Take 1 tablet (81 mg total) by mouth 3 (three) times a week.  Marland Kitchen augmented betamethasone dipropionate (DIPROLENE-AF) 0.05 % cream   . cholecalciferol (VITAMIN D3) 25 MCG (1000 UNIT) tablet Take 1,000 Units by mouth daily.  . folic acid (FOLVITE) 366 MCG tablet Take 400 mcg by mouth daily.  . hydrALAZINE (APRESOLINE) 25 MG tablet Take by mouth as needed.   . leflunomide (ARAVA) 10 MG tablet Take 1 tablet (10 mg total) by mouth daily.  . metoprolol tartrate (LOPRESSOR) 25 MG tablet Take 1 tablet (25 mg total) by mouth 2 (two) times daily.  . nitroGLYCERIN (NITROSTAT) 0.4 MG SL tablet Place 0.4 mg under the tongue every 5 (five) minutes as needed for chest pain.   . pantoprazole (PROTONIX) 40 MG tablet Take 1 tablet (40 mg total) by mouth daily with breakfast.  . rosuvastatin (CRESTOR) 40 MG tablet Take 1 tablet (40 mg total) by mouth daily.  . tacrolimus (PROTOPIC) 0.1 % ointment   . torsemide (DEMADEX) 20 MG tablet Take 1 tablet (20 mg total) by mouth daily as needed.  . traMADol (ULTRAM) 50 MG tablet Take 1 tablet (50 mg total) by mouth every 8 (eight) hours as needed.  . triamcinolone cream (KENALOG) 0.1 % Apply 1 application topically 3 (three) times daily as needed (itching).       Depression screen [facility] 2/9 03/28/2020 09/28/2019 09/01/2019 07/27/2019  Decreased Interest 0 0 0 0  Down, Depressed, Hopeless 0 0 0 -  PHQ - 2 Score 0 0 0 0     Objective:   Today's Vitals: BP (!) 160/80 (BP Location: Left Arm, Patient  Position: Sitting, Cuff Size: Normal)   Pulse 83   Temp 97.7 F (36.5 C) (Temporal)   Ht 5\' 10"  (1.778 m)   Wt 185 lb 3.2 oz (84 kg)   SpO2 97%   BMI 26.57 kg/m  Vitals with BMI 07/24/2020 07/18/2020 06/06/2020  Height 5\' 10"  5\' 10"  5\' 10"   Weight 185 lbs 3 oz 188 lbs 184 lbs 10 oz  BMI 26.57 44.03 47.42  Systolic 595 638 756  Diastolic 80 72 80  Pulse 83 48 85     Physical Exam   He looks systemically well.  Blood pressure  elevated.  He has lost about 3 pounds in weight.   Assessment   1. Essential hypertension   2. Type 2 diabetes mellitus with hyperosmolarity without coma, without long-term current use of insulin ([place])   3. Mixed hyperlipidemia   4. Stage 3a chronic kidney disease       Tests ordered Orders Placed This Encounter  Procedures  . Hemoglobin A1c     Plan: 1. He will continue with current antihypertensive therapy. 2. I will check a hemoglobin A1c for his diabetes and see if we need to make any adjustments, I doubt that this will be the case. 3. Today, I discussed a plant-based diet. 4. He will follow-up with Judson Roch in about a month's time for an annual Medicare wellness visit.   No orders of the defined types were placed in this encounter.   Doree Albee, MD

## 2020-07-25 ENCOUNTER — Telehealth (INDEPENDENT_AMBULATORY_CARE_PROVIDER_SITE_OTHER): Payer: Self-pay

## 2020-07-25 LAB — HEMOGLOBIN A1C
Hgb A1c MFr Bld: 6.3 % of total Hgb — ABNORMAL HIGH (ref <5.7)
Mean Plasma Glucose: 134 (calc)
eAG (mmol/L): 7.4 (calc)

## 2020-07-25 NOTE — Progress Notes (Signed)
Please call this patient back.  Tell him that his diabetes is a little worse but still does not need any medications.  I would recommend that he watch his diet carefully.  Avoid sugars, sodas, white flour.  Reduce meat intake.  Fish is okay.  Eat more vegetables and beans.Follow-up as scheduled.

## 2020-07-25 NOTE — Progress Notes (Signed)
Pt has has cereal in AM , bacon, egg. Then sandwich for lunch . Eating italian bread; which is with bread. So repeated the things to STOP eating & stay away from the following :NO sugars, white flour, preprocess  meats, sodas, cookies,cakes snacks.

## 2020-07-25 NOTE — Telephone Encounter (Signed)
-----   Message from Doree Albee, MD sent at 07/25/2020  9:42 AM EDT ----- Please call this patient back.  Tell him that his diabetes is a little worse but still does not need any medications.  I would recommend that he watch his diet carefully.  Avoid sugars, sodas, white flour.  Reduce meat intake.  Fish is okay.  Eat more vegetables and beans.Follow-up as scheduled.

## 2020-07-25 NOTE — Progress Notes (Signed)
No answer;no way to leave message will try later today. IF not able to reach I will send letter of labs and instruction to patient via mail.

## 2020-07-26 NOTE — Telephone Encounter (Signed)
Pt was given lab results  on 07/25/20.

## 2020-07-28 DIAGNOSIS — N184 Chronic kidney disease, stage 4 (severe): Secondary | ICD-10-CM | POA: Diagnosis not present

## 2020-07-28 DIAGNOSIS — E211 Secondary hyperparathyroidism, not elsewhere classified: Secondary | ICD-10-CM | POA: Diagnosis not present

## 2020-07-28 DIAGNOSIS — N189 Chronic kidney disease, unspecified: Secondary | ICD-10-CM | POA: Diagnosis not present

## 2020-07-28 DIAGNOSIS — R809 Proteinuria, unspecified: Secondary | ICD-10-CM | POA: Diagnosis not present

## 2020-07-28 DIAGNOSIS — D631 Anemia in chronic kidney disease: Secondary | ICD-10-CM | POA: Diagnosis not present

## 2020-07-28 DIAGNOSIS — I129 Hypertensive chronic kidney disease with stage 1 through stage 4 chronic kidney disease, or unspecified chronic kidney disease: Secondary | ICD-10-CM | POA: Diagnosis not present

## 2020-07-28 DIAGNOSIS — I5032 Chronic diastolic (congestive) heart failure: Secondary | ICD-10-CM | POA: Diagnosis not present

## 2020-08-01 ENCOUNTER — Telehealth: Payer: Self-pay | Admitting: *Deleted

## 2020-08-01 NOTE — Telephone Encounter (Signed)
Pt voiced understanding - routed to pcp  

## 2020-08-01 NOTE — Telephone Encounter (Signed)
-----   Message from Arnoldo Lenis, MD sent at 07/31/2020  7:54 PM EDT ----- Echo overall looks fine, just some mild age related stiffness  Zandra Abts MD

## 2020-08-01 NOTE — Progress Notes (Signed)
Office Visit Note  Patient: Jacob Farmer             Date of Birth: 06-Aug-1946           MRN: [id number]             PCP: Doree Albee, MD Referring: Doree Albee, MD Visit Date: 08/14/2020 Occupation: @GUAROCC @  Subjective:  Other (right side pain )   History of Present Illness: Jacob Farmer is a 74 y.o. male with history of rheumatoid arthritis, osteoarthritis and degenerative disc disease.  He states for the last 6 months he has been having some pain in the thoracic region and his rib cage.  He states is more prominent on the right than the left side.  He was evaluated by his PCP and had CT scan of his chest which was negative.  He states he also has nocturnal pain on the right side of his rib cage.  His rheumatoid arthritis is well controlled.  He has been on leflunomide 10 mg p.o. daily.  Activities of Daily Living:  Patient reports morning stiffness for 0 minutes.   Patient Reports nocturnal pain.  Difficulty dressing/grooming: Denies Difficulty climbing stairs: Denies Difficulty getting out of chair: Denies Difficulty using hands for taps, buttons, cutlery, and/or writing: Denies  Review of Systems  Constitutional: Negative for fatigue.  HENT: Negative for mouth sores, mouth dryness and nose dryness.   Eyes: Negative for itching and dryness.  Respiratory: Negative for shortness of breath and difficulty breathing.   Cardiovascular: Positive for swelling in legs/feet. Negative for chest pain and palpitations.  Gastrointestinal: Positive for constipation. Negative for blood in stool and diarrhea.  Endocrine: Positive for increased urination.  Genitourinary: Positive for urgency. Negative for difficulty urinating and hematuria.  Musculoskeletal: Negative for arthralgias, joint pain, joint swelling, myalgias, morning stiffness, muscle tenderness and myalgias.  Skin: Negative for color change, rash and redness.  Allergic/Immunologic: Negative for susceptible to  infections.  Neurological: Negative for dizziness, numbness, headaches, memory loss and weakness.  Hematological: Negative for bruising/bleeding tendency.  Psychiatric/Behavioral: Negative for confusion.    PMFS History:  Patient Active Problem List   Diagnosis Date Noted  . History of gastric ulcer   . Itching 09/01/2019  . Abnormal weight loss 07/31/2019  . Abnormal findings on diagnostic imaging of other abdominal regions, including retroperitoneum 07/29/2019  . Fatigue 07/29/2019  . Herniated cervical disc 05/26/2017    Class: Acute  . Herniation of cervical intervertebral disc with radiculopathy 05/26/2017  . Cervical spinal stenosis 05/26/2017  . Lumbar disc disease 02/04/2017  . Primary osteoarthritis of both hands 02/04/2017  . History of gout 02/04/2017  . DDD (degenerative disc disease), lumbar 02/04/2017  . Pain in joint of left knee 02/04/2017  . High risk medication use 01/28/2017  . History of prostate cancer 01/28/2017  . History of renal insufficiency syndrome 01/28/2017  . Malignant neoplasm of prostate ([place]) 11/17/2015  . Stage 3a chronic kidney disease 12/27/2011  . Type 2 diabetes mellitus with hyperosmolarity without coma, without long-term current use of insulin ([place]) 12/27/2011  . Rheumatoid arthritis ([place]) 01/04/2011  . SINUS BRADYCARDIA 01/12/2010  . 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 Hyperlipidemia 01/08/2010  . Essential hypertension 01/08/2010    Past Medical History:  Diagnosis Date  . Arteriosclerotic cardiovascular disease (ASCVD) cardiologist-  dr Roderic Palau branch   Acute myocardial infarction treated with TPA in 1990; 1999-stents to circumflex and RCA; residual total occlusion of the left anterior descending;  normal ejection fraction; stress nuclear in 2001-distal anteroseptal ischemia; normal LV function  . Chronic kidney disease, stage 3, mod decreased GFR nephrologist-  at [facility] in [place]    Creatinine-2.18 and 12/2007, 1.76 and 12/2008  . First degree heart block   . History of gastric ulcer   . History of kidney stones    05/23/17- small = no pain- watching  . History of MI (myocardial infarction)    1990-  treated w/ TPA  . Hyperlipidemia   . Hypertension   . Myocardial infarction ([place]) 1990  . OA (osteoarthritis)   . Pre-diabetes   . Prediabetes   . Prostate cancer [facility]) urologist-- dr dahlstedt/  oncologist- dr Tammi Klippel   Stage T1c , Gleason 3+4,  PSA 4.7,  vol 24cc--  scheduled for radiative seed implants  . Psoriatic arthritis ([place])   . RA (rheumatoid arthritis) ([place])    rheumologist-  dr Toni Amend  . S/P drug eluting coronary stent placement    1999--  DES x2 to CFX and RCA  . Sinus bradycardia   . Wears dentures    upper and lower partial    Family History  Problem Relation Age of Onset  . Heart attack Mother   . Coronary artery disease Father   . Ulcerative colitis Father   . Ulcers Father   . Breast cancer Sister   . COPD Brother   . Pancreatic cancer Brother   . Healthy Sister   . Diabetes Brother   . Cirrhosis Son   . Seizures Son    Past Surgical History:  Procedure Laterality Date  . BIOPSY  08/27/2019   Procedure: BIOPSY;  Surgeon: Rogene Houston, MD;  [facility];  Service: Endoscopy;;  gastric  . CARDIOVASCULAR STRESS TEST  2001  per Dr Lattie Haw clinic note   distal anteroseptal ischemia,  normal LVF  . COLONOSCOPY  last one 2006 (approx)  . CORONARY ANGIOPLASTY WITH STENT PLACEMENT  1999   DES x2  to CFX and RCA/  residual total occlusion LAD,  normal LVEF  . ESOPHAGOGASTRODUODENOSCOPY (EGD) WITH PROPOFOL N/A 08/27/2019   Procedure: ESOPHAGOGASTRODUODENOSCOPY (EGD) WITH PROPOFOL;  Surgeon: Rogene Houston, MD;  [facility];  Service: Endoscopy;  Laterality: N/A;  10:10  . EYE SURGERY Bilateral    cataract  . POSTERIOR CERVICAL FUSION/FORAMINOTOMY N/A 05/26/2017   Procedure: RIGHT C4-5 FORAMINOTOMY WITH EXCISION OF  HERNIATED [place];  Surgeon: Jessy Oto, MD;  Location: [facility];  Service: Orthopedics;  Laterality: N/A;  . RADIOACTIVE SEED IMPLANT N/A 01/11/2016   Procedure: RADIOACTIVE SEED IMPLANT/BRACHYTHERAPY IMPLANT;  Surgeon: Franchot Gallo, MD;  Location: [facility];  Service: Urology;  Laterality: N/A;   71  seeds implanted no seeds founds in bladder   Social History   Social History Narrative   Married for 24 years.Retired Clinical biochemist.   Immunization History  Administered Date(s) Administered  . Influenza, High Dose Seasonal PF 09/12/2017, 08/28/2019  . PFIZER SARS-COV-2 Vaccination 01/22/2020, 02/12/2020  . Zoster Recombinat (Shingrix) 07/29/2019     Objective: Vital Signs: BP (!) 162/83 (BP Location: Left Arm, Patient Position: Sitting, Cuff Size: Normal)   Pulse (!) 54   Resp 14   Ht 5\' 10"  (1.778 m)   Wt 179 lb (81.2 kg)   BMI 25.68 kg/m    Physical Exam Vitals and nursing note reviewed.  Constitutional:      Appearance: He is well-developed.  HENT:     Head: Normocephalic and atraumatic.  Eyes:     Conjunctiva/sclera: Conjunctivae normal.     Pupils: Pupils are equal, round, and reactive to light.  Cardiovascular:     Rate and Rhythm: Normal rate and regular rhythm.     Heart sounds: Normal heart sounds.  Pulmonary:     Effort: Pulmonary effort is normal.     Breath sounds: Normal breath sounds.  Abdominal:     General: Bowel sounds are normal.     Palpations: Abdomen is soft.  Musculoskeletal:     Cervical back: Normal range of motion and neck supple.  Skin:    General: Skin is warm and dry.     Capillary Refill: Capillary refill takes less than 2 seconds.  Neurological:     Mental Status: He is alert and oriented to person, place, and time.  Psychiatric:        Behavior: Behavior normal.      Musculoskeletal Exam: He had good range of motion of cervical spine.  He had no tenderness over thoracic spine.  He has some discomfort on palpation  of his right rib cage.  He had discomfort range of motion of the lumbar spine.  Shoulder joints, elbow joints, wrist joints with good range of motion.  He has PIP and DIP thickening but no synovitis was noted.  Hip joints, knee joints, ankles with good range of motion.  He had no tenderness on palpation over MTPs.  CDAI Exam: CDAI Score: 0.2  Patient Global: 1 mm; Provider Global: 1 mm Swollen: 0 ; Tender: 0  Joint Exam 08/14/2020   No joint exam has been documented for this visit   There is currently no information documented on the homunculus. Go to the Rheumatology activity and complete the homunculus joint exam.  Investigation: No additional findings.  Imaging: ECHOCARDIOGRAM COMPLETE  Result Date: 07/20/2020    ECHOCARDIOGRAM REPORT   Patient Name:   TOREZ BEAUREGARD Date of Exam: 07/20/2020 Medical Rec #:  [id number]      Height:       70.0 in Accession #:    [id number]     Weight:       188.0 lb Date of Birth:  [birth year]      BSA:          2.033 m Patient Age:    40 years       BP:           177/79 mmHg Patient Gender: M              HR:           58 bpm. Exam Location:  [facility] Procedure: 2D Echo, Cardiac Doppler and Color Doppler Indications:    CAD Native Vessel 414.01 / I25.10  History:        Patient has no prior history of Echocardiogram examinations.                 Previous Myocardial Infarction; Risk Factors:Hypertension,                 Diabetes and Dyslipidemia. History of prostate cancer, Stage 3a                 chronic kidney disease, S/P drug eluting coronary stent                 placement (From Hx).  Sonographer:    Alvino Chapel RCS Referring Phys: [id number] [place]  1. Left ventricular ejection fraction, by estimation, is  55 to 60%. The left ventricle has normal function. The left ventricle has no regional wall motion abnormalities. There is severe left ventricular hypertrophy. Left ventricular diastolic parameters  are consistent with Grade II diastolic  dysfunction (pseudonormalization). Elevated left atrial pressure.  2. Right ventricular systolic function is normal. The right ventricular size is normal. There is mildly elevated pulmonary artery systolic pressure.  3. The mitral valve is normal in structure. Mild mitral valve regurgitation. No evidence of mitral stenosis.  4. The aortic valve is tricuspid. Aortic valve regurgitation is mild. No aortic stenosis is present.  5. The inferior vena cava is normal in size with greater than 50% respiratory variability, suggesting right atrial pressure of 3 mmHg. FINDINGS  Left Ventricle: Left ventricular ejection fraction, by estimation, is 55 to 60%. The left ventricle has normal function. The left ventricle has no regional wall motion abnormalities. The left ventricular internal cavity size was normal in size. There is  severe left ventricular hypertrophy. Left ventricular diastolic parameters are consistent with Grade II diastolic dysfunction (pseudonormalization). Elevated left atrial pressure. Right Ventricle: The right ventricular size is normal. No increase in right ventricular wall thickness. Right ventricular systolic function is normal. There is mildly elevated pulmonary artery systolic pressure. The tricuspid regurgitant velocity is 2.81  m/s, and with an assumed right atrial pressure of 8 mmHg, the estimated right ventricular systolic pressure is 61.6 mmHg. Left Atrium: Left atrial size was normal in size. Right Atrium: Right atrial size was normal in size. Pericardium: There is no evidence of pericardial effusion. Mitral Valve: The mitral valve is normal in structure. Mild mitral valve regurgitation. No evidence of mitral valve stenosis. Tricuspid Valve: The tricuspid valve is normal in structure. Tricuspid valve regurgitation is mild . No evidence of tricuspid stenosis. Aortic Valve: The aortic valve is tricuspid. Aortic valve regurgitation is mild. No aortic stenosis is present. Aortic valve mean gradient  measures 3.2 mmHg. Aortic valve peak gradient measures 6.0 mmHg. Aortic valve area, by VTI measures 3.23 cm. Pulmonic Valve: The pulmonic valve was not well visualized. Pulmonic valve regurgitation is not visualized. No evidence of pulmonic stenosis. Aorta: The aortic root is normal in size and structure. Pulmonary Artery: Indeterminant PASP, inadequate TR jet. Venous: The inferior vena cava is normal in size with greater than 50% respiratory variability, suggesting right atrial pressure of 3 mmHg. IAS/Shunts: No atrial level shunt detected by color flow Doppler.  LEFT VENTRICLE PLAX 2D LVIDd:         4.96 cm  Diastology LVIDs:         3.35 cm  LV e' lateral:   7.51 cm/s LV PW:         1.44 cm  LV E/e' lateral: 14.2 LV IVS:        1.60 cm  LV e' medial:    5.55 cm/s LVOT diam:     2.20 cm  LV E/e' medial:  19.3 LV SV:         100 LV SV Index:   49 LVOT Area:     3.80 cm  RIGHT VENTRICLE RV S prime:     11.20 cm/s TAPSE (M-mode): 2.3 cm LEFT ATRIUM             Index       RIGHT ATRIUM           Index LA diam:        3.80 cm 1.87 cm/m  RA Area:     21.80 cm LA  Vol [facility]):   52.7 ml 25.92 ml/m RA Volume:   60.00 ml  29.51 ml/m LA Vol (A4C):   57.2 ml 28.13 ml/m LA Biplane Vol: 60.2 ml 29.61 ml/m  AORTIC VALVE AV Area (Vmax):    3.27 cm AV Area (Vmean):   2.98 cm AV Area (VTI):     3.23 cm AV Vmax:           122.09 cm/s AV Vmean:          85.116 cm/s AV VTI:            0.309 m AV Peak Grad:      6.0 mmHg AV Mean Grad:      3.2 mmHg LVOT Vmax:         105.00 cm/s LVOT Vmean:        66.800 cm/s LVOT VTI:          0.262 m LVOT/AV VTI ratio: 0.85  AORTA Ao Root diam: 3.80 cm MITRAL VALVE                TRICUSPID VALVE MV Area (PHT): 4.49 cm     TR Peak grad:   31.6 mmHg MV Decel Time: 169 msec     TR Vmax:        281.00 cm/s MV E velocity: 107.00 cm/s MV A velocity: 58.70 cm/s   SHUNTS MV E/A ratio:  1.82         Systemic VTI:  0.26 m                             Systemic Diam: 2.20 cm Carlyle Dolly MD  Electronically signed by Carlyle Dolly MD Signature Date/Time: 07/20/2020/3:42:54 PM    Final     Recent Labs: Lab Results  Component Value Date   WBC 6.3 06/01/2020   HGB 12.6 (L) 06/01/2020   PLT 181 06/01/2020   NA 139 08/08/2020   K 4.0 08/08/2020   CL 101 08/08/2020   CO2 30 08/08/2020   GLUCOSE 84 08/08/2020   BUN 32 (H) 08/08/2020   CREATININE 2.94 (H) 08/08/2020   BILITOT 0.5 06/06/2020   ALKPHOS 83 05/26/2017   AST 17 06/06/2020   ALT 16 06/06/2020   PROT 6.6 06/06/2020   ALBUMIN 3.1 (L) 05/26/2017   CALCIUM 9.2 08/08/2020   GFRAA 23 (L) 08/08/2020   July 20, 2020 labs from Dr. Toya Smothers office brought by patient showed CMP GFR 27, creatinine 2.29, folate 17.2, phosphorus 3.4, CBC hemoglobin 12.4, platelets 160, protein creatinine ratio 2.529   Speciality Comments: No specialty comments available.  Procedures:  No procedures performed Allergies: Patient has no known allergies.   Assessment / Plan:     Visit Diagnoses: Seronegative rheumatoid arthritis ([place]) -he had no synovitis on examination.  He states his rheumatoid arthritis is very well controlled on Arava.  He has been tolerating the medication well.  We will obtain x-rays to monitor her disease process.  Plan: XR Hand 2 View Right, XR Hand 2 View Left, XR Foot 2 Views Right, XR Foot 2 Views Left, x-rays were consistent with rheumatoid arthritis and osteoarthritis overlap.  No radiographic progression was noted when compared to the x-rays of 2019.  Patient was notified.  Leflunomide (ARAVA) 10 MG tablet  High risk medication use - Arava 10 mg 1 tablet by mouth daily.  Labs done recently at Dr. Toya Smothers office were reviewed which were stable.  Idiopathic chronic gout  of multiple sites without tophus - allopurinol 100 mg half tablet by mouth daily, colchicine. uric acid: 06/01/2020 5.7.  We will check uric acid level with his next labs.  Chronic bilateral thoracic back pain-he has been experiencing some thoracic  pain.  He states she CT scan of the chest was normal.  He had some tenderness over right rib cage.  I have given him several stretching exercises to do which were demonstrated in the office.  I have also advised him to schedule an appointment with Dr. Louanne Skye.  Primary osteoarthritis of both hands-joint protection muscle strengthening was discussed.  DDD (degenerative disc disease), lumbar-followed by Dr. Louanne Skye.  History of gastroesophageal reflux (GERD)  History of renal insufficiency syndrome - Followed by Dr. Theador Hawthorne.  His GFR is a stable.  History of coronary artery disease  History of prostate cancer  History of hypertension-systolic blood pressure was elevated today.  Have advised him to monitor his blood pressure closely.  History of hypercholesterolemia  Osteoporosis screening-he has not had a bone density in a long time.  He is over 69.  We will schedule a DEXA.  He had both COVID-19 vaccinations.  He is eligible to get a booster.  A note was given.  Use of mask, social distancing and hand hygiene was emphasized.  Orders: Orders Placed This Encounter  Procedures  . XR Hand 2 View Right  . XR Hand 2 View Left  . XR Foot 2 Views Right  . XR Foot 2 Views Left   Meds ordered this encounter  Medications  . allopurinol (ZYLOPRIM) 100 MG tablet    Sig: TAKE 1/2 TABLET (50 MG TOTAL) BY MOUTH DAILY.    Dispense:  45 tablet    Refill:  0  . DISCONTD: leflunomide (ARAVA) 10 MG tablet    Sig: Take 1 tablet (10 mg total) by mouth daily.    Dispense:  90 tablet    Refill:  0  . leflunomide (ARAVA) 10 MG tablet    Sig: Take 1 tablet (10 mg total) by mouth daily.    Dispense:  90 tablet    Refill:  0      Follow-Up Instructions: Return in about 5 months (around 01/14/2021) for Rheumatoid arthritis, Osteoarthritis.   Bo Merino, MD  Note - This record has been created using Editor, commissioning.  Chart creation errors have been sought, but may not always  have been  located. Such creation errors do not reflect on  the standard of medical care.

## 2020-08-08 ENCOUNTER — Other Ambulatory Visit: Payer: Self-pay | Admitting: Cardiology

## 2020-08-08 DIAGNOSIS — I10 Essential (primary) hypertension: Secondary | ICD-10-CM | POA: Diagnosis not present

## 2020-08-09 LAB — BASIC METABOLIC PANEL WITH GFR
BUN/Creatinine Ratio: 11 (calc) (ref 6–22)
BUN: 32 mg/dL — ABNORMAL HIGH (ref 7–25)
CO2: 30 mmol/L (ref 20–32)
Calcium: 9.2 mg/dL (ref 8.6–10.3)
Chloride: 101 mmol/L (ref 98–110)
Creat: 2.94 mg/dL — ABNORMAL HIGH (ref 0.70–1.18)
GFR, Est African American: 23 mL/min/{1.73_m2} — ABNORMAL LOW (ref >=60)
GFR, Est Non African American: 20 mL/min/{1.73_m2} — ABNORMAL LOW (ref >=60)
Glucose, Bld: 84 mg/dL (ref 65–139)
Potassium: 4 mmol/L (ref 3.5–5.3)
Sodium: 139 mmol/L (ref 135–146)

## 2020-08-09 LAB — TSH: TSH: 2.52 mIU/L (ref 0.40–4.50)

## 2020-08-09 LAB — MAGNESIUM: Magnesium: 2.1 mg/dL (ref 1.5–2.5)

## 2020-08-11 NOTE — Telephone Encounter (Signed)
ERROR

## 2020-08-14 ENCOUNTER — Ambulatory Visit: Payer: Self-pay

## 2020-08-14 ENCOUNTER — Telehealth: Payer: Self-pay | Admitting: *Deleted

## 2020-08-14 ENCOUNTER — Other Ambulatory Visit: Payer: Self-pay

## 2020-08-14 ENCOUNTER — Ambulatory Visit: Payer: Medicare HMO | Admitting: Rheumatology

## 2020-08-14 ENCOUNTER — Encounter: Payer: Self-pay | Admitting: *Deleted

## 2020-08-14 ENCOUNTER — Encounter: Payer: Self-pay | Admitting: Rheumatology

## 2020-08-14 VITALS — BP 162/83 | HR 54 | Resp 14 | Ht 70.0 in | Wt 179.0 lb

## 2020-08-14 DIAGNOSIS — M51369 Other intervertebral disc degeneration, lumbar region without mention of lumbar back pain or lower extremity pain: Secondary | ICD-10-CM

## 2020-08-14 DIAGNOSIS — Z8719 Personal history of other diseases of the digestive system: Secondary | ICD-10-CM

## 2020-08-14 DIAGNOSIS — Z13820 Encounter for screening for osteoporosis: Secondary | ICD-10-CM

## 2020-08-14 DIAGNOSIS — M0600 Rheumatoid arthritis without rheumatoid factor, unspecified site: Secondary | ICD-10-CM | POA: Diagnosis not present

## 2020-08-14 DIAGNOSIS — Z8679 Personal history of other diseases of the circulatory system: Secondary | ICD-10-CM | POA: Diagnosis not present

## 2020-08-14 DIAGNOSIS — Z87448 Personal history of other diseases of urinary system: Secondary | ICD-10-CM | POA: Diagnosis not present

## 2020-08-14 DIAGNOSIS — M1A09X0 Idiopathic chronic gout, multiple sites, without tophus (tophi): Secondary | ICD-10-CM

## 2020-08-14 DIAGNOSIS — Z79899 Other long term (current) drug therapy: Secondary | ICD-10-CM | POA: Diagnosis not present

## 2020-08-14 DIAGNOSIS — Z8639 Personal history of other endocrine, nutritional and metabolic disease: Secondary | ICD-10-CM

## 2020-08-14 DIAGNOSIS — M19041 Primary osteoarthritis, right hand: Secondary | ICD-10-CM | POA: Diagnosis not present

## 2020-08-14 DIAGNOSIS — R21 Rash and other nonspecific skin eruption: Secondary | ICD-10-CM

## 2020-08-14 DIAGNOSIS — M5136 Other intervertebral disc degeneration, lumbar region: Secondary | ICD-10-CM

## 2020-08-14 DIAGNOSIS — M19042 Primary osteoarthritis, left hand: Secondary | ICD-10-CM

## 2020-08-14 DIAGNOSIS — M546 Pain in thoracic spine: Secondary | ICD-10-CM | POA: Diagnosis not present

## 2020-08-14 DIAGNOSIS — Z8546 Personal history of malignant neoplasm of prostate: Secondary | ICD-10-CM

## 2020-08-14 DIAGNOSIS — G8929 Other chronic pain: Secondary | ICD-10-CM

## 2020-08-14 MED ORDER — LEFLUNOMIDE 10 MG PO TABS: 10.0000 mg | ORAL_TABLET | Freq: Every day | ORAL | 0 refills | Status: DC

## 2020-08-14 MED ORDER — ALLOPURINOL 100 MG PO TABS: ORAL_TABLET | ORAL | 0 refills | Status: DC

## 2020-08-14 NOTE — Telephone Encounter (Signed)
Per Dr. Estanislado Pandy x-rays showed OA and RA. No change since last x-rays in 2019.   Attempted to contact patient and unable to leave a message. NO answer and no voicemail.

## 2020-08-14 NOTE — Telephone Encounter (Signed)
Patient's wife advised Per Dr. Estanislado Pandy x-rays showed OA and RA. No change since last x-rays in 2019.

## 2020-08-14 NOTE — Patient Instructions (Signed)
Standing Labs We placed an order today for your standing lab work.   Please have your standing labs drawn in November and every 3 months please get uric acid with your next blood work.  If possible, please have your labs drawn 2 weeks prior to your appointment so that the provider can discuss your results at your appointment.  We have open lab daily Monday through Thursday from 8:30-12:30 PM and 1:30-4:30 PM and Friday from 8:30-12:30 PM and 1:30-4:00 PM at the office of Dr. Bo Merino, [facility].   Please be advised, patients with office appointments requiring lab work will take precedents over walk-in lab work.  If possible, please come for your lab work on Monday and Friday afternoons, as you may experience shorter wait times. The office is located at [address] [place], [place], [place] [address] No appointment is necessary.   Labs are drawn by [facility]. Please bring your co-pay at the time of your lab draw.  You may receive a bill from [facility] for your lab work.  If you wish to have your labs drawn at another location, please call the office 24 hours in advance to send orders.  If you have any questions regarding directions or hours of operation,  please call [phone].   As a reminder, please drink plenty of water prior to coming for your lab work. Thanks!   COVID-19 vaccine recommendations:   COVID-19 vaccine is recommended for everyone (unless you are allergic to a vaccine component), even if you are on a medication that suppresses your immune system.   If you are on Methotrexate, Cellcept (mycophenolate), Rinvoq, Morrie Sheldon, and Olumiant- hold the medication for 1 week after each vaccine. Hold Methotrexate for 2 weeks after the single dose COVID-19 vaccine.   If you are on Orencia subcutaneous injection - hold medication one week prior to and one week after the first COVID-19 vaccine dose (only).   If you are on Orencia IV infusions- time vaccination  administration so that the first COVID-19 vaccination will occur four weeks after the infusion and postpone the subsequent infusion by one week.   If you are on Cyclophosphamide or Rituxan infusions please contact your doctor prior to receiving the COVID-19 vaccine.   Do not take Tylenol or any anti-inflammatory medications (NSAIDs) 24 hours prior to the COVID-19 vaccination.   There is no direct evidence about the efficacy of the COVID-19 vaccine in individuals who are on medications that suppress the immune system.   Even if you are fully vaccinated, and you are on any medications that suppress your immune system, please continue to wear a mask, maintain at least six feet social distance and practice hand hygiene.   If you develop a COVID-19 infection, please contact your PCP or our office to determine if you need antibody infusion.  The booster vaccine is now available for immunocompromised patients. It is advised that if you had Pfizer vaccine you should get [employer] booster.  If you had a Moderna vaccine then you should get a Moderna booster. Johnson and Wynetta Emery does not have a booster vaccine at this time.  Please see the following web sites for updated information.   [email or web address]://[email or web address]/[email or web address]/[email or web address]-19-Vaccination-Patient-Resources.[email or web address]  [email or web address]://[email or web address]/About-Us/Newsroom/Press-Releases/ID/1159

## 2020-08-16 ENCOUNTER — Ambulatory Visit (INDEPENDENT_AMBULATORY_CARE_PROVIDER_SITE_OTHER): Payer: Medicare HMO | Admitting: Nurse Practitioner

## 2020-08-16 ENCOUNTER — Other Ambulatory Visit: Payer: Self-pay

## 2020-08-16 ENCOUNTER — Telehealth: Payer: Self-pay

## 2020-08-16 ENCOUNTER — Encounter (INDEPENDENT_AMBULATORY_CARE_PROVIDER_SITE_OTHER): Payer: Self-pay | Admitting: Nurse Practitioner

## 2020-08-16 VITALS — BP 131/72 | HR 66 | Temp 97.3°F | Resp 18 | Ht 70.0 in | Wt 178.2 lb

## 2020-08-16 DIAGNOSIS — Z0000 Encounter for general adult medical examination without abnormal findings: Secondary | ICD-10-CM

## 2020-08-16 NOTE — Telephone Encounter (Signed)
-----   Message from Arnoldo Lenis, MD sent at 08/16/2020 11:26 AM EDT ----- Some worsening in renal function, can we clarify how often and what dose of the torsemide he is taking   Zandra Abts MD

## 2020-08-16 NOTE — Telephone Encounter (Signed)
Wife confirmed that patient takes Torsemide 20 mg every other day.They have apt with nephrologist on Friday.

## 2020-08-16 NOTE — Progress Notes (Signed)
Subjective:   Jacob Farmer is a 74 y.o. male who presents for Medicare Annual/Subsequent preventive examination.        Objective:    Today's Vitals   08/16/20 0956 08/16/20 1019  BP: 131/72   Pulse: 66   Resp: 18   Temp: (!) 97.3 F (36.3 C)   TempSrc: Temporal   SpO2: 98%   Weight: 178 lb 3.2 oz (80.8 kg)   Height: 5\' 10"  (1.778 m)   PainSc:  1    Body mass index is 25.57 kg/m.  Advanced Directives 08/27/2019 08/24/2019 05/26/2017 09/06/2016 08/29/2016 08/21/2016 01/11/2016  Does Patient Have a Medical Advance Directive? Yes Yes Yes Yes Yes Yes Yes  Type of Advance Directive Living will Living will [place];Living will Living will Living will Living will Living will  Does patient want to make changes to medical advance directive? - No - Patient declined - No - Patient declined No - Patient declined No - Patient declined -  Copy of [place] in Chart? - - Yes No - copy requested No - copy requested No - copy requested -    Current Medications (verified) Outpatient Encounter Medications as of 08/16/2020  Medication Sig  . allopurinol (ZYLOPRIM) 100 MG tablet TAKE 1/2 TABLET (50 MG TOTAL) BY MOUTH DAILY.  Marland Kitchen amLODipine (NORVASC) 10 MG tablet Take 1 tablet (10 mg total) by mouth daily.  Marland Kitchen aspirin 81 MG tablet Take 1 tablet (81 mg total) by mouth 3 (three) times a week.  Marland Kitchen augmented betamethasone dipropionate (DIPROLENE-AF) 0.05 % cream   . cholecalciferol (VITAMIN D3) 25 MCG (1000 UNIT) tablet Take 1,000 Units by mouth daily.  . Dupilumab (DUPIXENT Placer) Inject into the skin every 14 (fourteen) days.  . folic acid (FOLVITE) 828 MCG tablet Take 400 mcg by mouth daily.  . hydrALAZINE (APRESOLINE) 10 MG tablet Take 10 mg by mouth as needed.  . hydrALAZINE (APRESOLINE) 25 MG tablet Take 50 mg by mouth 3 (three) times daily.   Marland Kitchen leflunomide (ARAVA) 10 MG tablet Take 1 tablet (10 mg total) by mouth daily.  . metoprolol tartrate (LOPRESSOR) 25 MG  tablet Take 1 tablet (25 mg total) by mouth 2 (two) times daily.  . nitroGLYCERIN (NITROSTAT) 0.4 MG SL tablet Place 0.4 mg under the tongue every 5 (five) minutes as needed for chest pain.   . pantoprazole (PROTONIX) 40 MG tablet Take 1 tablet (40 mg total) by mouth daily with breakfast.  . rosuvastatin (CRESTOR) 40 MG tablet Take 1 tablet (40 mg total) by mouth daily.  . tacrolimus (PROTOPIC) 0.1 % ointment   . torsemide (DEMADEX) 20 MG tablet Take 1 tablet (20 mg total) by mouth daily as needed.  . traMADol (ULTRAM) 50 MG tablet Take 1 tablet (50 mg total) by mouth every 8 (eight) hours as needed.  . triamcinolone cream (KENALOG) 0.1 % Apply 1 application topically 3 (three) times daily as needed (itching).    No facility-administered encounter medications on file as of 08/16/2020.    Allergies (verified) Patient has no known allergies.   History: Past Medical History:  Diagnosis Date  . Arteriosclerotic cardiovascular disease (ASCVD) cardiologist-  dr Roderic Palau branch   Acute myocardial infarction treated with TPA in 1990; 1999-stents to circumflex and RCA; residual total occlusion of the left anterior descending; normal ejection fraction; stress nuclear in 2001-distal anteroseptal ischemia; normal LV function  . Chronic kidney disease, stage 3, mod decreased GFR nephrologist-  at [facility] in  [place]   Creatinine-2.18 and 12/2007, 1.76 and 12/2008  . First degree heart block   . History of gastric ulcer   . History of kidney stones    05/23/17- small = no pain- watching  . History of MI (myocardial infarction)    1990-  treated w/ TPA  . Hyperlipidemia   . Hypertension   . Myocardial infarction ([place]) 1990  . OA (osteoarthritis)   . Pre-diabetes   . Prediabetes   . Prostate cancer [facility]) urologist-- dr dahlstedt/  oncologist- dr Tammi Klippel   Stage T1c , Gleason 3+4,  PSA 4.7,  vol 24cc--  scheduled for radiative seed implants  . Psoriatic arthritis ([place])   . RA (rheumatoid  arthritis) ([place])    rheumologist-  dr Toni Amend  . S/P drug eluting coronary stent placement    1999--  DES x2 to CFX and RCA  . Sinus bradycardia   . Wears dentures    upper and lower partial   Past Surgical History:  Procedure Laterality Date  . BIOPSY  08/27/2019   Procedure: BIOPSY;  Surgeon: Rogene Houston, MD;  [facility];  Service: Endoscopy;;  gastric  . CARDIOVASCULAR STRESS TEST  2001  per Dr Lattie Haw clinic note   distal anteroseptal ischemia,  normal LVF  . COLONOSCOPY  last one 2006 (approx)  . CORONARY ANGIOPLASTY WITH STENT PLACEMENT  1999   DES x2  to CFX and RCA/  residual total occlusion LAD,  normal LVEF  . ESOPHAGOGASTRODUODENOSCOPY (EGD) WITH PROPOFOL N/A 08/27/2019   Procedure: ESOPHAGOGASTRODUODENOSCOPY (EGD) WITH PROPOFOL;  Surgeon: Rogene Houston, MD;  [facility];  Service: Endoscopy;  Laterality: N/A;  10:10  . EYE SURGERY Bilateral    cataract  . POSTERIOR CERVICAL FUSION/FORAMINOTOMY N/A 05/26/2017   Procedure: RIGHT C4-5 FORAMINOTOMY WITH EXCISION OF HERNIATED [place];  Surgeon: Jessy Oto, MD;  Location: [place];  Service: Orthopedics;  Laterality: N/A;  . RADIOACTIVE SEED IMPLANT N/A 01/11/2016   Procedure: RADIOACTIVE SEED IMPLANT/BRACHYTHERAPY IMPLANT;  Surgeon: Franchot Gallo, MD;  Location: [facility];  Service: Urology;  Laterality: N/A;   24  seeds implanted no seeds founds in bladder   Family History  Problem Relation Age of Onset  . Heart attack Mother   . Coronary artery disease Father   . Ulcerative colitis Father   . Ulcers Father   . Breast cancer Sister   . COPD Brother   . Pancreatic cancer Brother   . Healthy Sister   . Diabetes Brother   . Cirrhosis Son   . Seizures Son    Social History   Socioeconomic History  . Marital status: Married    Spouse name: Not on file  . Number of children: 2  . Years of education: Not on file  . Highest education level: Not on file  Occupational  History  . Occupation: Retired Clinical biochemist  Tobacco Use  . Smoking status: Former Smoker    Packs/day: 1.00    Years: 30.00    Pack years: 30.00    Types: Cigarettes    Quit date: 05/21/1989    Years since quitting: 31.2  . Smokeless tobacco: Never Used  Vaping Use  . Vaping Use: Never used  Substance and Sexual Activity  . Alcohol use: Yes    Alcohol/week: 20.0 standard drinks    Types: 10 Shots of liquor, 10 Standard drinks or equivalent per week    Comment: 2-3 oz per day  . Drug use: No  .  Sexual activity: Not on file  Other Topics Concern  . Not on file  Social History Narrative   Married for 24 years.Retired Clinical biochemist.   Social Determinants of Health   Financial Resource Strain:   . Difficulty of Paying Living Expenses: Not on file  Food Insecurity:   . Worried About Charity fundraiser in the Last Year: Not on file  . Ran Out of Food in the Last Year: Not on file  Transportation Needs:   . Lack of Transportation (Medical): Not on file  . Lack of Transportation (Non-Medical): Not on file  Physical Activity:   . Days of Exercise per Week: Not on file  . Minutes of Exercise per Session: Not on file  Stress:   . Feeling of Stress : Not on file  Social Connections:   . Frequency of Communication with Friends and Family: Not on file  . Frequency of Social Gatherings with Friends and Family: Not on file  . Attends Religious Services: Not on file  . Active Member of Clubs or Organizations: Not on file  . Attends Archivist Meetings: Not on file  . Marital Status: Not on file    Tobacco Counseling Counseling given: Not Answered   Clinical Intake:  Pre-visit preparation completed: Yes  Pain : 0-10 Pain Score: 1  Pain Type: Chronic pain Pain Location: Chest Pain Orientation: Right Pain Radiating Towards: From back to right lateral chest Pain Descriptors / Indicators: Burning Pain Onset: More than a month ago Pain Frequency: Occasional     BMI  - recorded: 25.57 Nutritional Status: BMI 25 -29 Overweight Nutritional Risks: None Diabetes: Yes CBG done?: No Did pt. bring in CBG monitor from home?: No  How often do you need to have someone help you when you read instructions, pamphlets, or other written materials from your doctor or pharmacy?: 2 - Rarely What is the last grade level you completed in school?: 12th grade  Diabetic? Yes  Interpreter Needed?: No  Information entered by :: Jeralyn Ruths, NP-C   Activities of Daily Living In your present state of health, do you have any difficulty performing the following activities: 08/24/2019  Hearing? N  Vision? N  Difficulty concentrating or making decisions? N  Walking or climbing stairs? N  Dressing or bathing? N  Doing errands, shopping? N  Some recent data might be hidden    Patient Care Team: Doree Albee, MD as PCP - General (Internal Medicine) Harl Bowie Alphonse Guild, MD as PCP - Cardiology (Cardiology) Harl Bowie Alphonse Guild, MD as Consulting Physician (Cardiology) Lavonna Monarch, MD as Consulting Physician (Dermatology)  Indicate any recent Medical Services you may have received from other than [facility] providers in the past year (date may be approximate).     Assessment:   This is a routine wellness examination for Kaleem.  Hearing/Vision screen No exam data present  Dietary issues and exercise activities discussed:    Goals   None    Depression Screen PHQ 2/9 Scores 08/16/2020 08/16/2020 03/28/2020 09/28/2019 09/01/2019 07/27/2019  PHQ - 2 Score 0 0 0 0 0 0  Exception Documentation - Medical reason - - - -    Fall Risk Fall Risk  08/16/2020 08/16/2020 03/28/2020 09/28/2019 09/01/2019  Falls in the past year? 0 0 0 0 0  Number falls in past yr: 0 0 0 0 0  Injury with Fall? 0 0 0 0 0  Risk for fall due to : - No Fall Risks - - -  Follow up Falls evaluation completed;Education provided;Falls prevention discussed Falls evaluation completed Falls evaluation completed - -     Any stairs in or around the home? No  If so, are there any without handrails? Yes  Home free of loose throw rugs in walkways, pet beds, electrical cords, etc? Yes  Adequate lighting in your home to reduce risk of falls? Yes   ASSISTIVE DEVICES UTILIZED TO PREVENT FALLS:  Life alert? No  Use of a cane, walker or w/c? Yes  - has cane and walker to use as needed Grab bars in the bathroom? Yes  Shower chair or bench in shower? Yes  Elevated toilet seat or a handicapped toilet? Yes   TIMED UP AND GO:  Was the test performed? Yes .  Length of time to ambulate 10 feet: 10 sec.   Gait steady and fast without use of assistive device  Cognitive Function:     6CIT Screen 08/16/2020  What Year? 0 points  What month? 0 points  What time? 0 points  Count back from 20 0 points  Months in reverse 0 points  Repeat phrase 2 points  Total Score 2    Immunizations Immunization History  Administered Date(s) Administered  . Influenza, High Dose Seasonal PF 09/12/2017, 08/28/2019, 08/28/2019  . PFIZER SARS-COV-2 Vaccination 01/22/2020, 02/12/2020, 08/15/2020  . Zoster Recombinat (Shingrix) 07/29/2019    TDAP status: Due, Education has been provided regarding the importance of this vaccine. Advised may receive this vaccine at local pharmacy or Health Dept. Aware to provide a copy of the vaccination record if obtained from local pharmacy or Health Dept. Verbalized acceptance and understanding. Flu Vaccine status: Up to date Pneumococcal vaccine status: Up to date Covid-19 vaccine status: Completed vaccines  Qualifies for Shingles Vaccine? No   Zostavax completed No   Shingrix Completed?: Yes  Screening Tests Health Maintenance  Topic Date Due  . Hepatitis C Screening  Never done  . TETANUS/TDAP  Never done  . PNA vac Low Risk Adult (1 of 2 - PCV13) Never done  . OPHTHALMOLOGY EXAM  06/22/2020  . INFLUENZA VACCINE  07/16/2020  . FOOT EXAM  08/31/2020  . HEMOGLOBIN A1C  01/24/2021   . COVID-19 Vaccine  Completed    Health Maintenance  Health Maintenance Due  Topic Date Due  . Hepatitis C Screening  Never done  . TETANUS/TDAP  Never done  . PNA vac Low Risk Adult (1 of 2 - PCV13) Never done  . OPHTHALMOLOGY EXAM  06/22/2020  . INFLUENZA VACCINE  07/16/2020    Colorectal cancer screening: No longer required.   Lung Cancer Screening: (Low Dose CT Chest recommended if Age 33-80 years, 30 pack-year currently smoking OR have quit w/in 15years.) does not qualify.   Lung Cancer Screening Referral: N/A  Additional Screening:  Hepatitis C Screening: does qualify; patient would prefer to wait until next appointmen  Vision Screening: Recommended annual ophthalmology exams for early detection of glaucoma and other disorders of the eye. Is the patient up to date with their annual eye exam?  Yes  Who is the provider or what is the name of the office in which the patient attends annual eye exams? Dr. Eulas Post If pt is not established with a provider, would they like to be referred to a provider to establish care? No .   Dental Screening: Recommended annual dental exams for proper oral hygiene  Community Resource Referral / Chronic Care Management: CRR required this visit?  No   CCM required  this visit?  No      Plan:   He is not sure when his last tetanus vaccine was administered.  Encouraged him to discuss with his pharmacist next time he goes to the pharmacy.  Tells me he will consider this.  As far as other health maintenance screenings, he is up-to-date with all recommended screenings.  I have personally reviewed and noted the following in the patient's chart:   . Medical and social history . Use of alcohol, tobacco or illicit drugs  . Current medications and supplements . Functional ability and status . Nutritional status . Physical activity . Advanced directives . List of other physicians . Hospitalizations, surgeries, and ER visits in previous 12  months . Vitals . Screenings to include cognitive, depression, and falls . Referrals and appointments  In addition, I have reviewed and discussed with patient certain preventive protocols, quality metrics, and best practice recommendations. A written personalized care plan for preventive services as well as general preventive health recommendations were provided to patient.    He will follow-up in 3 months for office visit with Dr. Anastasio Champion.  Ailene Ards, NP   08/16/2020

## 2020-08-16 NOTE — Patient Instructions (Addendum)
Ask pharmacist about receiving the tetanus shot.  Ask about Hepatitis C screening when you see Dr. Anastasio Champion in 3 months.   Jacob Farmer , Thank you for taking time to come for your Medicare Wellness Visit. I appreciate your ongoing commitment to your health goals. Please review the following plan we discussed and let me know if I can assist you in the future.   These are the goals we discussed: Goals    . Prevent falls       This is a list of the screening recommended for you and due dates:  Health Maintenance  Topic Date Due  .  Hepatitis C: One time screening is recommended by Center for Disease Control  (CDC) for  adults born from [birth year] through 1967.   Never done  . Eye exam for diabetics  06/22/2020  . Tetanus Vaccine  08/16/2021*  . Pneumonia vaccines (2 of 2 - PCV13) 08/16/2021*  . Complete foot exam   08/31/2020  . Hemoglobin A1C  01/24/2021  . Flu Shot  Completed  . COVID-19 Vaccine  Completed  *Topic was postponed. The date shown is not the original due date.

## 2020-08-17 ENCOUNTER — Ambulatory Visit: Payer: Medicare HMO | Admitting: Surgery

## 2020-08-17 ENCOUNTER — Encounter: Payer: Self-pay | Admitting: Surgery

## 2020-08-17 ENCOUNTER — Ambulatory Visit: Payer: Self-pay

## 2020-08-17 VITALS — BP 139/72 | HR 56 | Temp 98.6°F

## 2020-08-17 DIAGNOSIS — M549 Dorsalgia, unspecified: Secondary | ICD-10-CM | POA: Diagnosis not present

## 2020-08-17 DIAGNOSIS — M47812 Spondylosis without myelopathy or radiculopathy, cervical region: Secondary | ICD-10-CM | POA: Diagnosis not present

## 2020-08-17 NOTE — Progress Notes (Signed)
Office Visit Note   Patient: Jacob Farmer           Date of Birth: [birth year]           MRN: [id number] Visit Date: 08/17/2020              Requested by: Doree Albee, MD [address] [place],  [place] [address] PCP: Doree Albee, MD   Assessment & Plan: Visit Diagnoses:  1. Spondylosis without myelopathy or radiculopathy, cervical region   2. Mid back pain     Plan: with Patient's ongoing symptoms I  will get MRI cervical spine to rule out HNP/stenosis.  He will follow-up with Dr. Louanne Skye in 3 weeks for recheck to discuss results and further treatment options.  Patient recently had CT scan chest and he did not have any suspicious lesions there could be contributing to his current symptoms.  Follow-Up Instructions: Return in about 3 weeks (around 09/07/2020) for with dr Louanne Skye to review cervical mri .   Orders:  Orders Placed This Encounter  Procedures  . XR Cervical Spine 2 or 3 views  . XR Thoracic Spine 2 View   No orders of the defined types were placed in this encounter.     Procedures: No procedures performed   Clinical Data: No additional findings.   Subjective: Chief Complaint  Patient presents with  . Middle Back - Pain    HPI 74 year old male comes in today with complaints of neck pain, scapular pain and right axillary pain.  States that this is been going on about 6 months.  No upper extremity numbness and tingling.  No pain radiating further down his arms.  Patient is status post right C4-5 foraminotomy with excision of herniated disc by Dr. Louanne Skye May 26, 2017.  Patient has discussed this current problem with his primary care physician who ordered a CT chest at study from June 01, 2020 showed:   Narrative & Impression  CLINICAL DATA:  Nonspecific chest pain in the shoulder blades for the past several weeks  EXAM: CT CHEST WITHOUT CONTRAST  TECHNIQUE: Multidetector CT imaging of the chest was performed following the standard protocol  without IV contrast.  COMPARISON:  Prior CT scan of the abdomen and pelvis 07/15/2019  FINDINGS: Cardiovascular: Limited evaluation in the absence of intravenous contrast. 2 vessel aortic arch. The right brachiocephalic and left common carotid artery share a common origin. Atherosclerotic calcifications are present along the thoracic aorta. The aorta is normal in caliber. No evidence of aneurysm. The main pulmonary artery is normal in caliber. The heart is normal in size. Atherosclerotic calcifications are noted along the coronary arteries. No pericardial effusion.  Mediastinum/Nodes: Unremarkable CT appearance of the thyroid gland. No suspicious mediastinal or hilar adenopathy. No soft tissue mediastinal mass. The thoracic esophagus is unremarkable.  Lungs/Pleura: The lungs are clear. No focal airspace consolidation, pulmonary edema, pleural effusion or pneumothorax. Faint linear scarring present along the anterior aspect of the left upper lobe. No suspicious pulmonary mass or nodule.  Upper Abdomen: No acute abnormality within the visualized upper abdomen.  Musculoskeletal: No acute fracture or aggressive appearing lytic or blastic osseous lesion.  IMPRESSION: 1. No acute cardiopulmonary process. 2. Coronary artery calcifications. 3. Mild linear scarring along the anterior aspect of the left upper lobe.  Aortic Atherosclerosis (ICD10-I70.0).   Electronically Signed   By: Jacqulynn Cadet M.D.   On: 06/02/2020 10:12    Patient has also been seen by his cardiologist.  Review of Systems No current cardiac pulmonary GI GU issues  Objective: Vital Signs: BP 139/72   Pulse (!) 56   Temp 98.6 F (37 C)   Physical Exam HENT:     Head: Normocephalic and atraumatic.  Eyes:     Extraocular Movements: Extraocular movements intact.     Pupils: Pupils are equal, round, and reactive to light.  Pulmonary:     Effort: No respiratory distress.    Musculoskeletal:     Comments: Patient has bilateral brachial plexus tenderness.  Bilateral medial scapular border tenderness.  Bilateral shoulders good range of motion.  Negative impingement test.  Negative drop arm.  No focal motor deficits.  Neurological:     General: No focal deficit present.     Mental Status: He is oriented to person, place, and time.     Ortho Exam  Specialty Comments:  No specialty comments available.  Imaging: No results found.   PMFS History: Patient Active Problem List   Diagnosis Date Noted  . History of gastric ulcer   . Itching 09/01/2019  . Abnormal weight loss 07/31/2019  . Abnormal findings on diagnostic imaging of other abdominal regions, including retroperitoneum 07/29/2019  . Fatigue 07/29/2019  . Herniated cervical disc 05/26/2017    Class: Acute  . Herniation of cervical intervertebral disc with radiculopathy 05/26/2017  . Cervical spinal stenosis 05/26/2017  . Lumbar disc disease 02/04/2017  . Primary osteoarthritis of both hands 02/04/2017  . History of gout 02/04/2017  . DDD (degenerative disc disease), lumbar 02/04/2017  . Pain in joint of left knee 02/04/2017  . High risk medication use 01/28/2017  . History of prostate cancer 01/28/2017  . History of renal insufficiency syndrome 01/28/2017  . Malignant neoplasm of prostate ([place]) 11/17/2015  . Stage 3a chronic kidney disease 12/27/2011  . Type 2 diabetes mellitus with hyperosmolarity without coma, without long-term current use of insulin ([place]) 12/27/2011  . Rheumatoid arthritis ([place]) 01/04/2011  . SINUS BRADYCARDIA 01/12/2010  . 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 Hyperlipidemia 01/08/2010  . Essential hypertension 01/08/2010   Past Medical History:  Diagnosis Date  . Arteriosclerotic cardiovascular disease (ASCVD) cardiologist-  dr Roderic Palau branch   Acute myocardial infarction treated with TPA in 1990; 1999-stents to circumflex and RCA; residual  total occlusion of the left anterior descending; normal ejection fraction; stress nuclear in 2001-distal anteroseptal ischemia; normal LV function  . Chronic kidney disease, stage 3, mod decreased GFR nephrologist-  at [facility] in [address]   Creatinine-2.18 and 12/2007, 1.76 and 12/2008  . First degree heart block   . History of gastric ulcer   . History of kidney stones    05/23/17- small = no pain- watching  . History of MI (myocardial infarction)    1990-  treated w/ TPA  . Hyperlipidemia   . Hypertension   . Myocardial infarction ([place]) 1990  . OA (osteoarthritis)   . Pre-diabetes   . Prediabetes   . Prostate cancer [facility]) urologist-- dr dahlstedt/  oncologist- dr Tammi Klippel   Stage T1c , Gleason 3+4,  PSA 4.7,  vol 24cc--  scheduled for radiative seed implants  . Psoriatic arthritis ([place])   . RA (rheumatoid arthritis) ([place])    rheumologist-  dr Toni Amend  . S/P drug eluting coronary stent placement    1999--  DES x2 to CFX and RCA  . Sinus bradycardia   . Wears dentures    upper and lower partial    Family History  Problem Relation Age  of Onset  . Heart attack Mother   . Coronary artery disease Father   . Ulcerative colitis Father   . Ulcers Father   . Breast cancer Sister   . COPD Brother   . Pancreatic cancer Brother   . Healthy Sister   . Diabetes Brother   . Cirrhosis Son   . Seizures Son     Past Surgical History:  Procedure Laterality Date  . BIOPSY  08/27/2019   Procedure: BIOPSY;  Surgeon: Rogene Houston, MD;  [facility];  Service: Endoscopy;;  gastric  . CARDIOVASCULAR STRESS TEST  2001  per Dr Lattie Haw clinic note   distal anteroseptal ischemia,  normal LVF  . COLONOSCOPY  last one 2006 (approx)  . CORONARY ANGIOPLASTY WITH STENT PLACEMENT  1999   DES x2  to CFX and RCA/  residual total occlusion LAD,  normal LVEF  . ESOPHAGOGASTRODUODENOSCOPY (EGD) WITH PROPOFOL N/A 08/27/2019   Procedure: ESOPHAGOGASTRODUODENOSCOPY (EGD) WITH PROPOFOL;   Surgeon: Rogene Houston, MD;  [facility];  Service: Endoscopy;  Laterality: N/A;  10:10  . EYE SURGERY Bilateral    cataract  . POSTERIOR CERVICAL FUSION/FORAMINOTOMY N/A 05/26/2017   Procedure: RIGHT C4-5 FORAMINOTOMY WITH EXCISION OF HERNIATED [place];  Surgeon: Jessy Oto, MD;  Location: [place];  Service: Orthopedics;  Laterality: N/A;  . RADIOACTIVE SEED IMPLANT N/A 01/11/2016   Procedure: RADIOACTIVE SEED IMPLANT/BRACHYTHERAPY IMPLANT;  Surgeon: Franchot Gallo, MD;  Location: [facility];  Service: Urology;  Laterality: N/A;   73  seeds implanted no seeds founds in bladder   Social History   Occupational History  . Occupation: Retired Clinical biochemist  Tobacco Use  . Smoking status: Former Smoker    Packs/day: 1.00    Years: 30.00    Pack years: 30.00    Types: Cigarettes    Quit date: 05/21/1989    Years since quitting: 31.2  . Smokeless tobacco: Never Used  Vaping Use  . Vaping Use: Never used  Substance and Sexual Activity  . Alcohol use: Yes    Alcohol/week: 20.0 standard drinks    Types: 10 Shots of liquor, 10 Standard drinks or equivalent per week    Comment: 2-3 oz per day  . Drug use: No  . Sexual activity: Not on file

## 2020-08-17 NOTE — Telephone Encounter (Signed)
Ok I will defer any changes to nephrology  Jacob Abts MD

## 2020-08-18 ENCOUNTER — Telehealth: Payer: Self-pay

## 2020-08-18 DIAGNOSIS — N184 Chronic kidney disease, stage 4 (severe): Secondary | ICD-10-CM | POA: Diagnosis not present

## 2020-08-18 DIAGNOSIS — D631 Anemia in chronic kidney disease: Secondary | ICD-10-CM | POA: Diagnosis not present

## 2020-08-18 DIAGNOSIS — E211 Secondary hyperparathyroidism, not elsewhere classified: Secondary | ICD-10-CM | POA: Diagnosis not present

## 2020-08-18 DIAGNOSIS — I5032 Chronic diastolic (congestive) heart failure: Secondary | ICD-10-CM | POA: Diagnosis not present

## 2020-08-18 DIAGNOSIS — R809 Proteinuria, unspecified: Secondary | ICD-10-CM | POA: Diagnosis not present

## 2020-08-18 DIAGNOSIS — N189 Chronic kidney disease, unspecified: Secondary | ICD-10-CM | POA: Diagnosis not present

## 2020-08-18 NOTE — Telephone Encounter (Signed)
Patient does not have a prescription of Xanax from this office I could call in Robaxin a muscle relaxant or prednisone for the spine symptoms.

## 2020-08-18 NOTE — Telephone Encounter (Signed)
Can you see if Dr. Sharol Given can advise on message below concerning Rx refill for Xanax?  Thank you

## 2020-08-18 NOTE — Telephone Encounter (Signed)
Patient wife called regarding xanax prescription to be refilled by Jeneen Rinks. Call back:[phone]

## 2020-08-18 NOTE — Telephone Encounter (Signed)
Can you please see below and advise?  

## 2020-08-18 NOTE — Telephone Encounter (Signed)
Recording said unable to complete call at this time after ringing several times and to try call again alter. Will hold.

## 2020-08-22 NOTE — Telephone Encounter (Signed)
Was not able to reach this pt.

## 2020-08-22 NOTE — Telephone Encounter (Signed)
Noted.   Can you advise on message below?

## 2020-08-23 ENCOUNTER — Other Ambulatory Visit: Payer: Self-pay | Admitting: Surgery

## 2020-08-23 MED ORDER — ALPRAZOLAM 0.25 MG PO TABS: ORAL_TABLET | ORAL | 0 refills | Status: DC

## 2020-08-23 NOTE — Telephone Encounter (Signed)
Tried calling patient to advise him of message below, but no answer and no VM set up to leave a message.

## 2020-08-23 NOTE — Telephone Encounter (Signed)
Sent 2 tablets into [facility].  Take as instructed

## 2020-08-25 ENCOUNTER — Ambulatory Visit (INDEPENDENT_AMBULATORY_CARE_PROVIDER_SITE_OTHER): Payer: Medicare HMO

## 2020-08-25 ENCOUNTER — Other Ambulatory Visit: Payer: Self-pay

## 2020-08-25 ENCOUNTER — Emergency Department (HOSPITAL_COMMUNITY)
Admission: EM | Admit: 2020-08-25 | Discharge: 2020-08-25 | Disposition: A | Discharge status: Home / Self Care | Payer: Medicare HMO | Attending: Emergency Medicine | Admitting: Emergency Medicine

## 2020-08-25 ENCOUNTER — Emergency Department (HOSPITAL_COMMUNITY): Payer: Medicare HMO

## 2020-08-25 ENCOUNTER — Encounter (HOSPITAL_COMMUNITY): Payer: Self-pay

## 2020-08-25 ENCOUNTER — Telehealth: Payer: Self-pay

## 2020-08-25 DIAGNOSIS — Z79899 Other long term (current) drug therapy: Secondary | ICD-10-CM | POA: Diagnosis not present

## 2020-08-25 DIAGNOSIS — I5032 Chronic diastolic (congestive) heart failure: Secondary | ICD-10-CM | POA: Diagnosis not present

## 2020-08-25 DIAGNOSIS — Z7982 Long term (current) use of aspirin: Secondary | ICD-10-CM | POA: Insufficient documentation

## 2020-08-25 DIAGNOSIS — Z8546 Personal history of malignant neoplasm of prostate: Secondary | ICD-10-CM | POA: Insufficient documentation

## 2020-08-25 DIAGNOSIS — R0602 Shortness of breath: Secondary | ICD-10-CM | POA: Diagnosis not present

## 2020-08-25 DIAGNOSIS — E119 Type 2 diabetes mellitus without complications: Secondary | ICD-10-CM | POA: Insufficient documentation

## 2020-08-25 DIAGNOSIS — Z955 Presence of coronary angioplasty implant and graft: Secondary | ICD-10-CM | POA: Insufficient documentation

## 2020-08-25 DIAGNOSIS — I495 Sick sinus syndrome: Secondary | ICD-10-CM

## 2020-08-25 DIAGNOSIS — D631 Anemia in chronic kidney disease: Secondary | ICD-10-CM | POA: Diagnosis not present

## 2020-08-25 DIAGNOSIS — Z20822 Contact with and (suspected) exposure to covid-19: Secondary | ICD-10-CM | POA: Insufficient documentation

## 2020-08-25 DIAGNOSIS — R001 Bradycardia, unspecified: Secondary | ICD-10-CM

## 2020-08-25 DIAGNOSIS — E211 Secondary hyperparathyroidism, not elsewhere classified: Secondary | ICD-10-CM | POA: Diagnosis not present

## 2020-08-25 DIAGNOSIS — Z87891 Personal history of nicotine dependence: Secondary | ICD-10-CM | POA: Insufficient documentation

## 2020-08-25 DIAGNOSIS — I129 Hypertensive chronic kidney disease with stage 1 through stage 4 chronic kidney disease, or unspecified chronic kidney disease: Secondary | ICD-10-CM | POA: Insufficient documentation

## 2020-08-25 DIAGNOSIS — N184 Chronic kidney disease, stage 4 (severe): Secondary | ICD-10-CM

## 2020-08-25 DIAGNOSIS — R531 Weakness: Secondary | ICD-10-CM | POA: Diagnosis present

## 2020-08-25 DIAGNOSIS — N1831 Chronic kidney disease, stage 3a: Secondary | ICD-10-CM | POA: Insufficient documentation

## 2020-08-25 DIAGNOSIS — I10 Essential (primary) hypertension: Secondary | ICD-10-CM | POA: Diagnosis not present

## 2020-08-25 DIAGNOSIS — Z7984 Long term (current) use of oral hypoglycemic drugs: Secondary | ICD-10-CM | POA: Insufficient documentation

## 2020-08-25 DIAGNOSIS — I9589 Other hypotension: Secondary | ICD-10-CM | POA: Diagnosis not present

## 2020-08-25 DIAGNOSIS — N189 Chronic kidney disease, unspecified: Secondary | ICD-10-CM | POA: Diagnosis not present

## 2020-08-25 HISTORY — DX: Atherosclerotic heart disease of native coronary artery without angina pectoris: I25.10

## 2020-08-25 HISTORY — DX: Chronic kidney disease, stage 4 (severe): N18.4

## 2020-08-25 LAB — MAGNESIUM: Magnesium: 2.4 mg/dL (ref 1.7–2.4)

## 2020-08-25 LAB — CBC WITH DIFFERENTIAL/PLATELET
Abs Immature Granulocytes: 0.03 10*3/uL (ref 0.00–0.07)
Basophils Absolute: 0.1 10*3/uL (ref 0.0–0.1)
Basophils Relative: 1 %
Eosinophils Absolute: 0.4 10*3/uL (ref 0.0–0.5)
Eosinophils Relative: 5 %
HCT: 43.7 % (ref 39.0–52.0)
Hemoglobin: 14.1 g/dL (ref 13.0–17.0)
Immature Granulocytes: 0 %
Lymphocytes Relative: 15 %
Lymphs Abs: 1.2 10*3/uL (ref 0.7–4.0)
MCH: 28.4 pg (ref 26.0–34.0)
MCHC: 32.3 g/dL (ref 30.0–36.0)
MCV: 88.1 fL (ref 80.0–100.0)
Monocytes Absolute: 1 10*3/uL (ref 0.1–1.0)
Monocytes Relative: 13 %
Neutro Abs: 5.5 10*3/uL (ref 1.7–7.7)
Neutrophils Relative %: 66 %
Platelets: 227 10*3/uL (ref 150–400)
RBC: 4.96 MIL/uL (ref 4.22–5.81)
RDW: 13.9 % (ref 11.5–15.5)
WBC: 8.2 10*3/uL (ref 4.0–10.5)
nRBC: 0 % (ref 0.0–0.2)

## 2020-08-25 LAB — COMPREHENSIVE METABOLIC PANEL
ALT: 35 U/L (ref 0–44)
AST: 32 U/L (ref 15–41)
Albumin: 3.6 g/dL (ref 3.5–5.0)
Alkaline Phosphatase: 107 U/L (ref 38–126)
Anion gap: 11 (ref 5–15)
BUN: 35 mg/dL — ABNORMAL HIGH (ref 8–23)
CO2: 27 mmol/L (ref 22–32)
Calcium: 9.3 mg/dL (ref 8.9–10.3)
Chloride: 100 mmol/L (ref 98–111)
Creatinine, Ser: 3.05 mg/dL — ABNORMAL HIGH (ref 0.61–1.24)
GFR calc Af Amer: 22 mL/min — ABNORMAL LOW (ref >60)
GFR calc non Af Amer: 19 mL/min — ABNORMAL LOW (ref >60)
Glucose, Bld: 114 mg/dL — ABNORMAL HIGH (ref 70–99)
Potassium: 3.3 mmol/L — ABNORMAL LOW (ref 3.5–5.1)
Sodium: 138 mmol/L (ref 135–145)
Total Bilirubin: 0.6 mg/dL (ref 0.3–1.2)
Total Protein: 8 g/dL (ref 6.5–8.1)

## 2020-08-25 LAB — TROPONIN I (HIGH SENSITIVITY)
Troponin I (High Sensitivity): 20 ng/L — ABNORMAL HIGH (ref <18)
Troponin I (High Sensitivity): 19 ng/L — ABNORMAL HIGH (ref <18)

## 2020-08-25 LAB — SARS CORONAVIRUS 2 BY RT PCR (HOSPITAL ORDER, PERFORMED IN ~~LOC~~ HOSPITAL LAB): SARS Coronavirus 2: NEGATIVE

## 2020-08-25 MED ORDER — POTASSIUM CHLORIDE CRYS ER 20 MEQ PO TBCR
40.0000 meq | EXTENDED_RELEASE_TABLET | Freq: Once | ORAL | Status: AC
  Administered 2020-08-25: 40 meq via ORAL
  Filled 2020-08-25: qty 2

## 2020-08-25 NOTE — Telephone Encounter (Signed)
7 day Zio event monitor for bradycardia to be placed on patient, ED room 1, [id number], per Dr.McDowell   Follow up Friday, Sept 17 at 8:30 am with Ayesha Rumpf

## 2020-08-25 NOTE — ED Provider Notes (Signed)
[facility] Provider Note   CSN: [id number] Arrival date & time: 08/25/20  1159     History No chief complaint on file.   Jacob Farmer is a 74 y.o. male.  Patient sent to the emergency department for bradycardia by his nephrologist  The history is provided by the patient and medical records. No language interpreter was used.  Weakness Severity:  Mild Onset quality:  Gradual Timing:  Constant Progression:  Unchanged Chronicity:  New Context: not alcohol use   Relieved by:  Nothing Worsened by:  Nothing Ineffective treatments:  None tried Associated symptoms: no abdominal pain, no chest pain, no cough, no diarrhea, no frequency, no headaches and no seizures        Past Medical History:  Diagnosis Date  . Arteriosclerotic cardiovascular disease (ASCVD) cardiologist-  dr Roderic Palau branch   Acute myocardial infarction treated with TPA in 1990; 1999-stents to circumflex and RCA; residual total occlusion of the left anterior descending; normal ejection fraction; stress nuclear in 2001-distal anteroseptal ischemia; normal LV function  . Chronic kidney disease, stage 3, mod decreased GFR nephrologist-  at [facility] in [place]   Creatinine-2.18 and 12/2007, 1.76 and 12/2008  . First degree heart block   . History of gastric ulcer   . History of kidney stones    05/23/17- small = no pain- watching  . History of MI (myocardial infarction)    1990-  treated w/ TPA  . Hyperlipidemia   . Hypertension   . Myocardial infarction ([place]) 1990  . OA (osteoarthritis)   . Pre-diabetes   . Prediabetes   . Prostate cancer [facility]) urologist-- dr dahlstedt/  oncologist- dr Tammi Klippel   Stage T1c , Gleason 3+4,  PSA 4.7,  vol 24cc--  scheduled for radiative seed implants  . Psoriatic arthritis ([place])   . RA (rheumatoid arthritis) ([place])    rheumologist-  dr Toni Amend  . S/P drug eluting coronary stent placement    1999--  DES x2 to CFX and RCA  . Sinus bradycardia   .  Wears dentures    upper and lower partial    Patient Active Problem List   Diagnosis Date Noted  . History of gastric ulcer   . Itching 09/01/2019  . Abnormal weight loss 07/31/2019  . Abnormal findings on diagnostic imaging of other abdominal regions, including retroperitoneum 07/29/2019  . Fatigue 07/29/2019  . Herniated cervical disc 05/26/2017    Class: Acute  . Herniation of cervical intervertebral disc with radiculopathy 05/26/2017  . Cervical spinal stenosis 05/26/2017  . Lumbar disc disease 02/04/2017  . Primary osteoarthritis of both hands 02/04/2017  . History of gout 02/04/2017  . DDD (degenerative disc disease), lumbar 02/04/2017  . Pain in joint of left knee 02/04/2017  . High risk medication use 01/28/2017  . History of prostate cancer 01/28/2017  . History of renal insufficiency syndrome 01/28/2017  . Malignant neoplasm of prostate ([place]) 11/17/2015  . Stage 3a chronic kidney disease 12/27/2011  . Type 2 diabetes mellitus with hyperosmolarity without coma, without long-term current use of insulin ([place]) 12/27/2011  . Rheumatoid arthritis ([place]) 01/04/2011  . SINUS BRADYCARDIA 01/12/2010  . 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 Hyperlipidemia 01/08/2010  . Essential hypertension 01/08/2010    Past Surgical History:  Procedure Laterality Date  . BIOPSY  08/27/2019   Procedure: BIOPSY;  Surgeon: Rogene Houston, MD;  [facility];  Service: Endoscopy;;  gastric  . CARDIOVASCULAR STRESS TEST  2001  per  Dr Lattie Haw clinic note   distal anteroseptal ischemia,  normal LVF  . COLONOSCOPY  last one 2006 (approx)  . CORONARY ANGIOPLASTY WITH STENT PLACEMENT  1999   DES x2  to CFX and RCA/  residual total occlusion LAD,  normal LVEF  . ESOPHAGOGASTRODUODENOSCOPY (EGD) WITH PROPOFOL N/A 08/27/2019   Procedure: ESOPHAGOGASTRODUODENOSCOPY (EGD) WITH PROPOFOL;  Surgeon: Rogene Houston, MD;  Location[facility];  Service: Endoscopy;   Laterality: N/A;  10:10  . EYE SURGERY Bilateral    cataract  . POSTERIOR CERVICAL FUSION/FORAMINOTOMY N/A 05/26/2017   Procedure: RIGHT C4-5 FORAMINOTOMY WITH EXCISION OF HERNIATED [place];  Surgeon: Jessy Oto, MD;  Location: [facility];  Service: Orthopedics;  Laterality: N/A;  . RADIOACTIVE SEED IMPLANT N/A 01/11/2016   Procedure: RADIOACTIVE SEED IMPLANT/BRACHYTHERAPY IMPLANT;  Surgeon: Franchot Gallo, MD;  Location: [facility];  Service: Urology;  Laterality: N/A;   16  seeds implanted no seeds founds in bladder       Family History  Problem Relation Age of Onset  . Heart attack Mother   . Coronary artery disease Father   . Ulcerative colitis Father   . Ulcers Father   . Breast cancer Sister   . COPD Brother   . Pancreatic cancer Brother   . Healthy Sister   . Diabetes Brother   . Cirrhosis Son   . Seizures Son     Social History   Tobacco Use  . Smoking status: Former Smoker    Packs/day: 1.00    Years: 30.00    Pack years: 30.00    Types: Cigarettes    Quit date: 05/21/1989    Years since quitting: 31.2  . Smokeless tobacco: Never Used  Vaping Use  . Vaping Use: Never used  Substance Use Topics  . Alcohol use: Yes    Alcohol/week: 20.0 standard drinks    Types: 10 Shots of liquor, 10 Standard drinks or equivalent per week    Comment: 2-3 oz per day  . Drug use: No    Home Medications Prior to Admission medications   Medication Sig Start Date End Date Taking? Authorizing Provider  aspirin 81 MG tablet Take 1 tablet (81 mg total) by mouth 3 (three) times a week. 08/28/19  Yes Rehman, Mechele Dawley, MD  nitroGLYCERIN (NITROSTAT) 0.4 MG SL tablet Place 0.4 mg under the tongue every 5 (five) minutes as needed for chest pain.  08/03/19  Yes [provider]  allopurinol (ZYLOPRIM) 100 MG tablet TAKE 1/2 TABLET (50 MG TOTAL) BY MOUTH DAILY. 08/14/20   Bo Merino, MD  ALPRAZolam Duanne Moron) 0.25 MG tablet Take 1 tablet 1 hour before MRI scan and  another immediately before if needed 08/23/20   Lanae Crumbly, PA-C  amLODipine (NORVASC) 10 MG tablet Take 1 tablet (10 mg total) by mouth daily. Patient taking differently: Take 5 mg by mouth daily.  03/28/20   Maryruth Hancock, MD  augmented betamethasone dipropionate (DIPROLENE-AF) 0.05 % cream  11/04/19   [provider]  cholecalciferol (VITAMIN D3) 25 MCG (1000 UNIT) tablet Take 1,000 Units by mouth daily.    [provider]  Dupilumab (DUPIXENT [place]) Inject into the skin every 14 (fourteen) days.    [provider]  folic acid (FOLVITE) 269 MCG tablet Take 400 mcg by mouth daily.    [provider]  glimepiride (AMARYL) 1 MG tablet Take 1 mg by mouth daily. 04/24/20   [provider]  hydrALAZINE (APRESOLINE) 10 MG  tablet Take 10 mg by mouth as needed.    [provider]  hydrALAZINE (APRESOLINE) 25 MG tablet Take 50 mg by mouth 3 (three) times daily.  01/03/20 01/02/21  [provider]  leflunomide (ARAVA) 10 MG tablet Take 1 tablet (10 mg total) by mouth daily. 08/14/20   Bo Merino, MD  losartan (COZAAR) 25 MG tablet Take 1 tablet by mouth daily. 08/18/20   [provider]  metoprolol tartrate (LOPRESSOR) 25 MG tablet Take 1 tablet (25 mg total) by mouth 2 (two) times daily. 07/18/20 10/16/20  Arnoldo Lenis, MD  pantoprazole (PROTONIX) 40 MG tablet Take 1 tablet (40 mg total) by mouth daily with breakfast. 12/14/19   Rehman, Mechele Dawley, MD  rosuvastatin (CRESTOR) 40 MG tablet Take 1 tablet (40 mg total) by mouth daily. 07/18/20 10/16/20  Arnoldo Lenis, MD  tacrolimus (PROTOPIC) 0.1 % ointment  12/16/19   [provider]  torsemide (DEMADEX) 20 MG tablet Take 1 tablet (20 mg total) by mouth daily as needed. 07/19/20 10/17/20  Arnoldo Lenis, MD  traMADol (ULTRAM) 50 MG tablet Take 1 tablet (50 mg total) by mouth every 8 (eight) hours as needed. 06/29/20   Doree Albee, MD  triamcinolone cream (KENALOG) 0.1 %  Apply 1 application topically 3 (three) times daily as needed (itching).  06/22/19   [provider]    Allergies    Patient has no known allergies.  Review of Systems   Review of Systems  Constitutional: Negative for appetite change and fatigue.  HENT: Negative for congestion, ear discharge and sinus pressure.   Eyes: Negative for discharge.  Respiratory: Negative for cough.   Cardiovascular: Negative for chest pain.  Gastrointestinal: Negative for abdominal pain and diarrhea.  Genitourinary: Negative for frequency and hematuria.  Musculoskeletal: Negative for back pain.  Skin: Negative for rash.  Neurological: Positive for weakness. Negative for seizures and headaches.  Psychiatric/Behavioral: Negative for hallucinations.    Physical Exam Updated Vital Signs BP (!) 172/151   Pulse (!) 36   Temp 98.4 F (36.9 C) (Oral)   Resp 15   Ht 5\' 10"  (1.778 m)   Wt 80.7 kg   SpO2 99%   BMI 25.54 kg/m   Physical Exam Vitals reviewed.  Constitutional:      Appearance: He is well-developed.  HENT:     Head: Normocephalic.     Nose: Nose normal.  Eyes:     General: No scleral icterus.    Conjunctiva/sclera: Conjunctivae normal.  Neck:     Thyroid: No thyromegaly.  Cardiovascular:     Rate and Rhythm: Rhythm irregular.     Heart sounds: No murmur heard.  No friction rub. No gallop.      Comments: Bradycardia Pulmonary:     Breath sounds: No stridor. No wheezing or rales.  Chest:     Chest wall: No tenderness.  Abdominal:     General: There is no distension.     Tenderness: There is no abdominal tenderness. There is no rebound.  Musculoskeletal:        General: Normal range of motion.     Cervical back: Neck supple.  Lymphadenopathy:     Cervical: No cervical adenopathy.  Skin:    Findings: No erythema or rash.  Neurological:     Mental Status: He is alert and oriented to person, place, and time.     Motor: No abnormal muscle tone.     Coordination:  Coordination normal.  Psychiatric:  Behavior: Behavior normal.     ED Results / Procedures / Treatments   Labs (all labs ordered are listed, but only abnormal results are displayed) Labs Reviewed  COMPREHENSIVE METABOLIC PANEL - Abnormal; Notable for the following components:      Result Value   Potassium 3.3 (*)    Glucose, Bld 114 (*)    BUN 35 (*)    Creatinine, Ser 3.05 (*)    GFR calc non Af Amer 19 (*)    GFR calc Af Amer 22 (*)    All other components within normal limits  TROPONIN I (HIGH SENSITIVITY) - Abnormal; Notable for the following components:   Troponin I (High Sensitivity) 20 (*)    All other components within normal limits  SARS CORONAVIRUS 2 BY RT PCR (HOSPITAL ORDER, [facility])  CBC WITH DIFFERENTIAL/PLATELET  MAGNESIUM  TROPONIN I (HIGH SENSITIVITY)    EKG None  Radiology DG Chest Port 1 View  Result Date: 08/25/2020 CLINICAL DATA:  Shortness of breath EXAM: PORTABLE CHEST 1 VIEW COMPARISON:  Chest radiograph May 26, 2017; chest CT June 01, 2020 FINDINGS: There is no edema or airspace opacity. Heart size and pulmonary vascularity are normal. No adenopathy. No bone lesions. IMPRESSION: Lungs clear. Cardiac silhouette within normal limits. No adenopathy. Electronically Signed   By: Lowella Grip III M.D.   On: 08/25/2020 13:47    Procedures Procedures (including critical care time)  Medications Ordered in ED Medications  potassium chloride SA (KLOR-CON) CR tablet 40 mEq (has no administration in time range)    ED Course  I have reviewed the triage vital signs and the nursing notes.  Pertinent labs & imaging results that were available during my care of the patient were reviewed by me and considered in my medical decision making (see chart for details). CRITICAL CARE Performed by: Milton Ferguson Total critical care time: 40 minutes Critical care time was exclusive of separately billable procedures and treating  other patients. Critical care was necessary to treat or prevent imminent or life-threatening deterioration. Critical care was time spent personally by me on the following activities: development of treatment plan with patient and/or surrogate as well as nursing, discussions with consultants, evaluation of patient's response to treatment, examination of patient, obtaining history from patient or surrogate, ordering and performing treatments and interventions, ordering and review of laboratory studies, ordering and review of radiographic studies, pulse oximetry and re-evaluation of patient's condition.    MDM Rules/Calculators/A&P                         Patient with bradycardia.  he was seen by cardiology in the emergency department.  Labs unremarkable except for mild elevated troponin.  He will be sent home and stop taking his metoprolol.  A monitor was placed on him to be worn at home and he will follow up with cardiology       This patient presents to the ED for concern of bradycardia, this involves an extensive number of treatment options, and is a complaint that carries with it a high risk of complications and morbidity.  The differential diagnosis includes heart block MI   Lab Tests:   I Ordered, reviewed, and interpreted labs, which included CBC and chemistries which showed mild low potassium and renal insufficiency that is stable  Medicines ordered:     Imaging Studies ordered:   I ordered imaging studies which included chest x-ray  I independently visualized  and interpreted imaging which showed unremarkable  Additional history obtained:   Additional history obtained from nephrology  Previous records obtained and reviewed.  Consultations Obtained:   I consulted cardiology and discussed lab and imaging findings  Reevaluation:  After the interventions stated above, I reevaluated the patient and found improved  Critical Interventions:  .   Final Clinical  Impression(s) / ED Diagnoses Final diagnoses:  Bradycardia    Rx / DC Orders ED Discharge Orders    None       Milton Ferguson, MD 08/26/20 1524

## 2020-08-25 NOTE — Consult Note (Signed)
Cardiology Consultation:   Patient ID: Jacob Farmer; [id number]; [birth year]   Admit date: 08/25/2020 Date of Consult: 08/25/2020  Primary Care Provider: Doree Albee, MD Primary Cardiologist: Carlyle Dolly, MD Primary Electrophysiologist: None   Patient Profile:   Jacob Farmer is a 74 y.o. male with a history of CAD as discussed below, CKD stage IV, bradycardia, hypertension, hyperlipidemia, rheumatoid arthritis, and prediabetes who is being seen today for the evaluation of bradycardia at the request of Dr. Roderic Palau.  History of Present Illness:   [name] was referred to the [facility] ER today by his nephrologist Dr. Theador Hawthorne after presenting for routine evaluation and being found to be bradycardic with mild relative hypotension.  Heart rate recorded at office visit was 36.  Situation was discussed with his primary cardiologist Dr. Harl Bowie.  I reviewed the chart.  He was last seen by Dr. Harl Bowie in early August at which point heart rate was recorded at 48.  ECG from that day showed sinus bradycardia at 45 bpm with prolonged PR interval, also apparent dropped sinus beats with no A-V dissociation.  Lopressor was decreased from 50 mg twice daily to 25 mg twice daily at that time.  In talking with the patient in the ER, he does not describe any sudden dizziness or syncope, states that he has been checking his blood pressure and heart rate at home with various findings.  He is not consistently bradycardic, blood pressure has also not been low consistently.  While being observed in the ER his heart rate is in the 50s to 60s on telemetry and his blood pressure has been consistently elevated with systolics in the 782N to 562Z.  He denies any palpitations or chest pain recently.  I personally reviewed his tracings from today which shows sinus rhythm with prolonged PR interval, right bundle branch block, occasional PACs and PVCs, also intermittent dropped sinus beats which tend to result  in his most bradycardic episodes, none of which are sustained.  He does not have A-V dissociation.  Past Medical History:  Diagnosis Date  . CAD (coronary artery disease)    Acute myocardial infarction treated with TPA in 1990; 1999-stents to circumflex and RCA; residual total occlusion of the left anterior descending; normal ejection fraction; stress nuclear in 2001-distal anteroseptal ischemia; normal LV function  . CKD (chronic kidney disease) stage 4, GFR 15-29 ml/min (HCC)   . First degree heart block   . History of gastric ulcer   . History of kidney stones   . History of MI (myocardial infarction)    1990-  treated w/ TPA  . Hyperlipidemia   . Hypertension   . OA (osteoarthritis)   . Prediabetes   . Prostate cancer ([place])    Stage T1c , Gleason 3+4,  PSA 4.7,  vol 24cc--  scheduled for radiative seed implants  . Psoriatic arthritis ([place])   . RA (rheumatoid arthritis) (HCC)    Dr. Toni Amend  . Sinus bradycardia   . Wears dentures     Past Surgical History:  Procedure Laterality Date  . BIOPSY  08/27/2019   Procedure: BIOPSY;  Surgeon: Rogene Houston, MD;  [facility];  Service: Endoscopy;;  gastric  . CARDIOVASCULAR STRESS TEST  2001  per Dr Lattie Haw clinic note   distal anteroseptal ischemia,  normal LVF  . COLONOSCOPY  last one 2006 (approx)  . CORONARY ANGIOPLASTY WITH STENT PLACEMENT  1999   DES x2  to CFX and RCA/  residual  total occlusion LAD,  normal LVEF  . ESOPHAGOGASTRODUODENOSCOPY (EGD) WITH PROPOFOL N/A 08/27/2019   Procedure: ESOPHAGOGASTRODUODENOSCOPY (EGD) WITH PROPOFOL;  Surgeon: Rogene Houston, MD;  [facility];  Service: Endoscopy;  Laterality: N/A;  10:10  . EYE SURGERY Bilateral    cataract  . POSTERIOR CERVICAL FUSION/FORAMINOTOMY N/A 05/26/2017   Procedure: RIGHT C4-5 FORAMINOTOMY WITH EXCISION OF HERNIATED [place];  Surgeon: Jessy Oto, MD;  Location: [place];  Service: Orthopedics;  Laterality: N/A;  . RADIOACTIVE SEED  IMPLANT N/A 01/11/2016   Procedure: RADIOACTIVE SEED IMPLANT/BRACHYTHERAPY IMPLANT;  Surgeon: Franchot Gallo, MD;  Location: [facility];  Service: Urology;  Laterality: N/A;   73  seeds implanted no seeds founds in bladder     Outpatient Medications: No current facility-administered medications on file prior to encounter.   Current Outpatient Medications on File Prior to Encounter  Medication Sig Dispense Refill  . aspirin 81 MG tablet Take 1 tablet (81 mg total) by mouth 3 (three) times a week. 30 tablet   . nitroGLYCERIN (NITROSTAT) 0.4 MG SL tablet Place 0.4 mg under the tongue every 5 (five) minutes as needed for chest pain.     Marland Kitchen allopurinol (ZYLOPRIM) 100 MG tablet TAKE 1/2 TABLET (50 MG TOTAL) BY MOUTH DAILY. 45 tablet 0  . ALPRAZolam (XANAX) 0.25 MG tablet Take 1 tablet 1 hour before MRI scan and another immediately before if needed 2 tablet 0  . amLODipine (NORVASC) 10 MG tablet Take 1 tablet (10 mg total) by mouth daily. (Patient taking differently: Take 5 mg by mouth daily. ) 90 tablet 1  . augmented betamethasone dipropionate (DIPROLENE-AF) 0.05 % cream     . cholecalciferol (VITAMIN D3) 25 MCG (1000 UNIT) tablet Take 1,000 Units by mouth daily.    . Dupilumab (DUPIXENT [place]) Inject into the skin every 14 (fourteen) days.    . folic acid (FOLVITE) 324 MCG tablet Take 400 mcg by mouth daily.    Marland Kitchen glimepiride (AMARYL) 1 MG tablet Take 1 mg by mouth daily.    . hydrALAZINE (APRESOLINE) 10 MG tablet Take 10 mg by mouth as needed.    . hydrALAZINE (APRESOLINE) 25 MG tablet Take 50 mg by mouth 3 (three) times daily.     Marland Kitchen leflunomide (ARAVA) 10 MG tablet Take 1 tablet (10 mg total) by mouth daily. 90 tablet 0  . losartan (COZAAR) 25 MG tablet Take 1 tablet by mouth daily.    . metoprolol tartrate (LOPRESSOR) 25 MG tablet Take 1 tablet (25 mg total) by mouth 2 (two) times daily. 180 tablet 1  . pantoprazole (PROTONIX) 40 MG tablet Take 1 tablet (40 mg total) by mouth  daily with breakfast. 90 tablet 1  . rosuvastatin (CRESTOR) 40 MG tablet Take 1 tablet (40 mg total) by mouth daily. 90 tablet 1  . tacrolimus (PROTOPIC) 0.1 % ointment     . torsemide (DEMADEX) 20 MG tablet Take 1 tablet (20 mg total) by mouth daily as needed. 90 tablet 1  . traMADol (ULTRAM) 50 MG tablet Take 1 tablet (50 mg total) by mouth every 8 (eight) hours as needed. 30 tablet 2  . triamcinolone cream (KENALOG) 0.1 % Apply 1 application topically 3 (three) times daily as needed (itching).       Allergies:   No Known Allergies  Social History:   Social History   Tobacco Use  . Smoking status: Former Smoker    Packs/day: 1.00    Years: 30.00  Pack years: 30.00    Types: Cigarettes    Quit date: 05/21/1989    Years since quitting: 31.2  . Smokeless tobacco: Never Used  Substance Use Topics  . Alcohol use: Yes    Alcohol/week: 20.0 standard drinks    Types: 10 Shots of liquor, 10 Standard drinks or equivalent per week    Comment: 2-3 oz per day    Family History:   The patient's family history includes Breast cancer in his sister; COPD in his brother; Cirrhosis in his son; Coronary artery disease in his father; Diabetes in his brother; Healthy in his sister; Heart attack in his mother; Pancreatic cancer in his brother; Seizures in his son; Ulcerative colitis in his father; Ulcers in his father.  ROS:  No syncope or reproducible exertional chest pain.  Physical Exam/Data:   Vitals:   08/25/20 1500 08/25/20 1515 08/25/20 1530 08/25/20 1545  BP: (!) 186/87  (!) 198/88   Pulse: (!) 32 (!) 48 (!) 55 (!) 55  Resp:      Temp:      TempSrc:      SpO2: 100% 99% 100% 100%  Weight:      Height:       No intake or output data in the 24 hours ending 08/25/20 1614 Filed Weights   08/25/20 1256  Weight: 80.7 kg   Body mass index is 25.54 kg/m.   Gen: Patient appears comfortable at rest. HEENT: Conjunctiva and lids normal, wearing a mask. Neck: Supple, no elevated JVP or  carotid bruits, no thyromegaly. Lungs: Clear to auscultation, nonlabored breathing at rest. Cardiac: Regular rate and rhythm with occasional ectopy, no S3, 2/6 systolic murmur, no pericardial rub. Abdomen: Soft, nontender, bowel sounds present. Extremities: No pitting edema, distal pulses 2+. Skin: Warm and dry. Musculoskeletal: No kyphosis. Neuropsychiatric: Alert and oriented x3, affect grossly appropriate.  Telemetry:  I personally reviewed telemetry which shows sinus rhythm with occasional dropped sinus beats.  Relevant CV Studies:  Echocardiogram 07/20/2020: 1. Left ventricular ejection fraction, by estimation, is 55 to 60%. The  left ventricle has normal function. The left ventricle has no regional  wall motion abnormalities. There is severe left ventricular hypertrophy.  Left ventricular diastolic parameters  are consistent with Grade II diastolic dysfunction (pseudonormalization).  Elevated left atrial pressure.  2. Right ventricular systolic function is normal. The right ventricular  size is normal. There is mildly elevated pulmonary artery systolic  pressure.  3. The mitral valve is normal in structure. Mild mitral valve  regurgitation. No evidence of mitral stenosis.  4. The aortic valve is tricuspid. Aortic valve regurgitation is mild. No  aortic stenosis is present.  5. The inferior vena cava is normal in size with greater than 50%  respiratory variability, suggesting right atrial pressure of 3 mmHg.   Laboratory Data:  Chemistry Recent Labs  Lab 08/25/20 1318  NA 138  K 3.3*  CL 100  CO2 27  GLUCOSE 114*  BUN 35*  CREATININE 3.05*  CALCIUM 9.3  GFRNONAA 19*  GFRAA 22*  ANIONGAP 11    Recent Labs  Lab 08/25/20 1318  PROT 8.0  ALBUMIN 3.6  AST 32  ALT 35  ALKPHOS 107  BILITOT 0.6   Hematology Recent Labs  Lab 08/25/20 1318  WBC 8.2  RBC 4.96  HGB 14.1  HCT 43.7  MCV 88.1  MCH 28.4  MCHC 32.3  RDW 13.9  PLT 227   Cardiac  Enzymes Recent Labs  Lab 08/25/20 1318 08/25/20  [place]    Radiology/Studies:  DG Chest Port 1 View  Result Date: 08/25/2020 CLINICAL DATA:  Shortness of breath EXAM: PORTABLE CHEST 1 VIEW COMPARISON:  Chest radiograph May 26, 2017; chest CT June 01, 2020 FINDINGS: There is no edema or airspace opacity. Heart size and pulmonary vascularity are normal. No adenopathy. No bone lesions. IMPRESSION: Lungs clear. Cardiac silhouette within normal limits. No adenopathy. Electronically Signed   By: Lowella Grip III M.D.   On: 08/25/2020 13:47    Assessment and Plan:   1.  Bradycardia, not substantially symptomatic per discussion with patient today.  No associated chest pain or clear evidence of ACS by cardiac enzymes.  He has evidence of conduction system disease, prolonged PR interval at baseline and intermittent dropped sinus beats, possibly SA node dysfunction.  No A-V dissociation however.  He has been on Lopressor 25 mg twice daily most recently, already cut back 1 month ago.  No recent syncope and otherwise hemodynamically stable, in fact hypertensive under observation in the ER.  2.  CKD stage IV, creatinine 3.05 with potassium 3.3.  He follows with Dr. Theador Hawthorne.  3.  Essential hypertension, also on Norvasc and hydralazine at home.  4.  CAD status post previous PCI as reviewed above.  No reported angina symptoms.  High-sensitivity troponin I levels are not consistent with ACS.  I discussed the situation with the patient and his sister present in the ER, also Dr. Roderic Palau.  He would like to go home, I have recommended placement of a Zio patch at discharge, completely discontinue Lopressor at this time.  Continue Norvasc and hydralazine (neither should affect heart rate), check blood pressure at home as before in case further adjustments are needed.  We will arrange a close outpatient follow-up visit for next week for clinical reevaluation.  I have recommended that he return  for reevaluation in the ER if he becomes symptomatic in terms of sudden dizziness or syncope in the interim.  Signed, Rozann Lesches, MD  08/25/2020 4:14 PM

## 2020-08-25 NOTE — Discharge Instructions (Addendum)
Follow up with your heart md next week.  Return if problems,  stop taking your metoprolol

## 2020-08-25 NOTE — ED Triage Notes (Signed)
Pt states he was sent by his kidney doctor and cardiologist, unsure why. States his heart rate will drop into the 30's

## 2020-08-31 DIAGNOSIS — N184 Chronic kidney disease, stage 4 (severe): Secondary | ICD-10-CM | POA: Diagnosis not present

## 2020-08-31 DIAGNOSIS — N189 Chronic kidney disease, unspecified: Secondary | ICD-10-CM | POA: Diagnosis not present

## 2020-08-31 DIAGNOSIS — D631 Anemia in chronic kidney disease: Secondary | ICD-10-CM | POA: Diagnosis not present

## 2020-08-31 DIAGNOSIS — E211 Secondary hyperparathyroidism, not elsewhere classified: Secondary | ICD-10-CM | POA: Diagnosis not present

## 2020-08-31 DIAGNOSIS — R809 Proteinuria, unspecified: Secondary | ICD-10-CM | POA: Diagnosis not present

## 2020-08-31 DIAGNOSIS — I5032 Chronic diastolic (congestive) heart failure: Secondary | ICD-10-CM | POA: Diagnosis not present

## 2020-08-31 NOTE — Progress Notes (Signed)
Cardiology Office Note  Date: 09/01/2020   ID: Jacob Farmer, DOB Oct 09, 1946, MRN [id number]  PCP:  Doree Albee, MD  Cardiologist:  Carlyle Dolly, MD Electrophysiologist:  None   Chief Complaint: Coronary artery disease  History of Present Illness: Jacob Farmer is a 74 y.o. male with a history of coronary artery disease, HTN, HLD, prostate CA, CKD stage III, rheumatoid arthritis, sinus bradycardia.  Last encounter with Dr. Harl Bowie 07/18/2020.  Had no symptoms related to CAD and was continuing current medications.  Home blood pressures tended to run better than clinic numbers and have been at goal.  He was continued on current medications.  His LDL was not at goal.  He was changed to Crestor 40 mg daily.  Secondary to bradycardia his metoprolol was lowered to 25 mg p.o. twice daily.  An echocardiogram was ordered along with BMP, magnesium and TSH in 2 weeks.  Patient had recently been to his nephrologist office Dr. Theador Hawthorne and found to be bradycardic with mild relative hypotension.  His heart rate was 36.  He had last seen Dr. Harl Bowie in early August at which point his heart rate was recorded at 28.  EKG at that time showed sinus bradycardia with a rate of 45.  Patient did not describe any syncope.  His EKG showed prolonged PR interval, RBBB, occasional PACs and PVCs, intermittent drop sinus beats.  He did not have A-V dissociation.   He is here for follow-up today.  He denies any lightheadedness, dizziness, presyncopal or syncopal episode, or other orthostatic symptoms.  He has a log of blood pressures ranging from 419Q to 222L systolic.  States he has been taking the blood pressures at all times during the day.  Sometimes taking the blood pressures before he reduces his antihypertensive medications.  Heart rate today is 44.  His beta-blocker was discontinued due to slow heart rate.  He is on 14-day ZIO monitor which ends today.  Denies any CVA or TIA-like symptoms, palpitations or  arrhythmias, PND, orthopnea, claudication, DVT or PE-like symptoms, lower extremity edema.  He takes torsemide as needed.  He still has stage IV renal disease and sees Dr. Theador Hawthorne.  States his appetite has been poor and has been losing weight recently.  Past Medical History:  Diagnosis Date  . CAD (coronary artery disease)    Acute myocardial infarction treated with TPA in 1990; 1999-stents to circumflex and RCA; residual total occlusion of the left anterior descending; normal ejection fraction; stress nuclear in 2001-distal anteroseptal ischemia; normal LV function  . CKD (chronic kidney disease) stage 4, GFR 15-29 ml/min (HCC)   . First degree heart block   . History of gastric ulcer   . History of kidney stones   . History of MI (myocardial infarction)    1990-  treated w/ TPA  . Hyperlipidemia   . Hypertension   . OA (osteoarthritis)   . Prediabetes   . Prostate cancer ([place])    Stage T1c , Gleason 3+4,  PSA 4.7,  vol 24cc--  scheduled for radiative seed implants  . Psoriatic arthritis ([place])   . RA (rheumatoid arthritis) (HCC)    Dr. Toni Amend  . Sinus bradycardia   . Wears dentures     Past Surgical History:  Procedure Laterality Date  . BIOPSY  08/27/2019   Procedure: BIOPSY;  Surgeon: Rogene Houston, MD;  [facility];  Service: Endoscopy;;  gastric  . CARDIOVASCULAR STRESS TEST  2001  per Dr  Kelford clinic note   distal anteroseptal ischemia,  normal LVF  . COLONOSCOPY  last one 2006 (approx)  . CORONARY ANGIOPLASTY WITH STENT PLACEMENT  1999   DES x2  to CFX and RCA/  residual total occlusion LAD,  normal LVEF  . ESOPHAGOGASTRODUODENOSCOPY (EGD) WITH PROPOFOL N/A 08/27/2019   Procedure: ESOPHAGOGASTRODUODENOSCOPY (EGD) WITH PROPOFOL;  Surgeon: Rogene Houston, MD;  [facility];  Service: Endoscopy;  Laterality: N/A;  10:10  . EYE SURGERY Bilateral    cataract  . POSTERIOR CERVICAL FUSION/FORAMINOTOMY N/A 05/26/2017   Procedure: RIGHT C4-5  FORAMINOTOMY WITH EXCISION OF HERNIATED [place];  Surgeon: Jessy Oto, MD;  Location: [place];  Service: Orthopedics;  Laterality: N/A;  . RADIOACTIVE SEED IMPLANT N/A 01/11/2016   Procedure: RADIOACTIVE SEED IMPLANT/BRACHYTHERAPY IMPLANT;  Surgeon: Franchot Gallo, MD;  Location: [facility];  Service: Urology;  Laterality: N/A;   73  seeds implanted no seeds founds in bladder    Current Outpatient Medications  Medication Sig Dispense Refill  . allopurinol (ZYLOPRIM) 100 MG tablet TAKE 1/2 TABLET (50 MG TOTAL) BY MOUTH DAILY. 45 tablet 0  . amLODipine (NORVASC) 5 MG tablet Take 5 mg by mouth daily.    Marland Kitchen aspirin 81 MG tablet Take 1 tablet (81 mg total) by mouth 3 (three) times a week. 30 tablet   . cholecalciferol (VITAMIN D3) 25 MCG (1000 UNIT) tablet Take 1,000 Units by mouth daily.    . Dupilumab (DUPIXENT [place]) Inject into the skin every 14 (fourteen) days.    . folic acid (FOLVITE) 270 MCG tablet Take 400 mcg by mouth daily.    . hydrALAZINE (APRESOLINE) 25 MG tablet Take 25 mg by mouth 3 (three) times daily.     Marland Kitchen leflunomide (ARAVA) 10 MG tablet Take 1 tablet (10 mg total) by mouth daily. 90 tablet 0  . nitroGLYCERIN (NITROSTAT) 0.4 MG SL tablet Place 0.4 mg under the tongue every 5 (five) minutes as needed for chest pain.     . rosuvastatin (CRESTOR) 40 MG tablet Take 1 tablet (40 mg total) by mouth daily. 90 tablet 1  . torsemide (DEMADEX) 20 MG tablet Take 1 tablet (20 mg total) by mouth daily as needed. 90 tablet 1  . triamcinolone cream (KENALOG) 0.1 % Apply 1 application topically 3 (three) times daily as needed (itching).     . triamcinolone cream (KENALOG) 0.1 % Apply 1 application topically as needed.     No current facility-administered medications for this visit.   Allergies:  Patient has no known allergies.   Social History: The patient  reports that he quit smoking about 31 years ago. His smoking use included cigarettes. He has a 30.00 pack-year smoking  history. He has never used smokeless tobacco. He reports current alcohol use of about 20.0 standard drinks of alcohol per week. He reports that he does not use drugs.   Family History: The patient's family history includes Breast cancer in his sister; COPD in his brother; Cirrhosis in his son; Coronary artery disease in his father; Diabetes in his brother; Healthy in his sister; Heart attack in his mother; Pancreatic cancer in his brother; Seizures in his son; Ulcerative colitis in his father; Ulcers in his father.   ROS:  Please see the history of present illness. Otherwise, complete review of systems is positive for .  All other systems are reviewed and negative.   Physical Exam: VS:  BP (!) 160/78   Pulse (!) 44   Ht  5\' 10"  (1.778 m)   Wt 173 lb 9.6 oz (78.7 kg)   SpO2 97%   BMI 24.91 kg/m , BMI Body mass index is 24.91 kg/m.  Wt Readings from Last 3 Encounters:  09/01/20 173 lb 9.6 oz (78.7 kg)  08/25/20 178 lb (80.7 kg)  08/16/20 178 lb 3.2 oz (80.8 kg)    General: Patient appears comfortable at rest. Neck: Supple, no elevated JVP or carotid bruits, no thyromegaly. Lungs: Clear to auscultation, nonlabored breathing at rest. Cardiac: Regular rate and rhythm, no S3 or significant systolic murmur, no pericardial rub. Extremities: No pitting edema, distal pulses 2+. Skin: Warm and dry. Musculoskeletal: No kyphosis. Neuropsychiatric: Alert and oriented x3, affect grossly appropriate.  ECG:  Recent EKG on 08/25/2020 at [facility] emergency room showed sinus rhythm at 63, supraventricular bigeminy, sinus pause, prolonged PR, RBBB and left posterior fascicular block  Recent Labwork: 08/08/2020: TSH 2.52 08/25/2020: ALT 35; AST 32; BUN 35; Creatinine, Ser 3.05; Hemoglobin 14.1; Magnesium 2.4; Platelets 227; Potassium 3.3; Sodium 138     Component Value Date/Time   CHOL 141 05/12/2020 0804   TRIG 136 05/12/2020 0804   HDL 35 (L) 05/12/2020 0804   CHOLHDL 4.0 05/12/2020 0804   VLDL 44  (H) 01/05/2011 2022   [place] 83 05/12/2020 0804    Other Studies Reviewed Today:   Echocardiogram 07/20/2020 1. Left ventricular ejection fraction, by estimation, is 55 to 60%. The left ventricle has normal function. The left ventricle has no regional wall motion abnormalities. There is severe left ventricular hypertrophy. Left ventricular diastolic parameters are consistent with Grade II diastolic dysfunction (pseudonormalization). Elevated left atrial pressure. 2. Right ventricular systolic function is normal. The right ventricular size is normal. There is mildly elevated pulmonary artery systolic pressure. 3. The mitral valve is normal in structure. Mild mitral valve regurgitation. No evidence of mitral stenosis. 4. The aortic valve is tricuspid. Aortic valve regurgitation is mild. No aortic stenosis is present. 5. The inferior vena cava is normal in size with greater than 50% respiratory variability, suggesting right atrial pressure of 3 mmHg  . Assessment and Plan:  1. SINUS BRADYCARDIA   2. CAD in native artery   3. Essential hypertension   4. Mixed hyperlipidemia    1. SINUS BRADYCARDIA Recent history of nonsymptomatic bradycardia with his nephrologist sending him to [facility] emergency room for bradycardia during office visit.  He has a ZIO monitor which ends today to check for bradycardia arrhythmias.  He is on no AV nodal blockers.  2. CAD in native artery History of MI in the past.  Denies any anginal or exertional symptoms.  Continue aspirin 81 mg.  Continue nitroglycerin sublingual as needed  3. Essential hypertension Is with him a log of blood pressures ranging from 270J to 500 systolic.  He has been taking his blood pressures randomly.  States sometimes he takes the blood pressure measurements prior to taking his antihypertensive medications.  States his nephrologist recently reduced his amlodipine to 5 mg.  His beta-blocker was stopped due to bradycardia.  Continue  amlodipine 5 mg daily.  Continue hydralazine 25 mg p.o. 3 times daily.  Continue torsemide 20 mg p.o. as needed  4. Mixed hyperlipidemia Continue rosuvastatin 40 mg p.o. daily. 04/2020 TC 141 HDL 35 TG 136 LDL 83   Medication Adjustments/Labs and Tests Ordered: Current medicines are reviewed at length with the patient today.  Concerns regarding medicines are outlined above.   Disposition: Follow-up with Dr. Harl Bowie or APP 1  month  Signed, Levell July, NP 09/01/2020 9:27 AM    [facility] [place], [place], [place] [address] Phone: [phone]; Fax: [phone]

## 2020-09-01 ENCOUNTER — Other Ambulatory Visit: Payer: Self-pay

## 2020-09-01 ENCOUNTER — Encounter: Payer: Self-pay | Admitting: Family Medicine

## 2020-09-01 ENCOUNTER — Ambulatory Visit: Payer: Medicare HMO | Admitting: Family Medicine

## 2020-09-01 VITALS — BP 160/78 | HR 44 | Ht 70.0 in | Wt 173.6 lb

## 2020-09-01 DIAGNOSIS — I10 Essential (primary) hypertension: Secondary | ICD-10-CM | POA: Diagnosis not present

## 2020-09-01 DIAGNOSIS — I2510 Atherosclerotic heart disease of native coronary artery without angina pectoris: Secondary | ICD-10-CM | POA: Diagnosis not present

## 2020-09-01 DIAGNOSIS — I495 Sick sinus syndrome: Secondary | ICD-10-CM

## 2020-09-01 DIAGNOSIS — E782 Mixed hyperlipidemia: Secondary | ICD-10-CM | POA: Diagnosis not present

## 2020-09-01 MED ORDER — AMLODIPINE BESYLATE 5 MG PO TABS: 5.0000 mg | ORAL_TABLET | Freq: Every day | ORAL | 3 refills | Status: DC

## 2020-09-01 MED ORDER — ROSUVASTATIN CALCIUM 40 MG PO TABS: 40.0000 mg | ORAL_TABLET | Freq: Every day | ORAL | 3 refills | Status: DC

## 2020-09-01 NOTE — Patient Instructions (Signed)
Medication Instructions:  Continue all current medications.  Labwork: none  Testing/Procedures: none  Follow-Up: 4 weeks   Any Other Special Instructions Will Be Listed Below (If Applicable).  If you need a refill on your cardiac medications before your next appointment, please call your pharmacy.

## 2020-09-02 ENCOUNTER — Other Ambulatory Visit: Payer: Self-pay | Admitting: Rheumatology

## 2020-09-04 NOTE — Telephone Encounter (Signed)
Last Visit: 08/14/2020 Next Visit: 01/15/2021 Labs: 08/25/2020 Potassium 3.3, Glucose 114, BUN 35, Creat. 3.05, GFR 19  Current Dose per office note on 08/14/2020: allopurinol 100 mg half tablet by mouth daily Dx: Idiopathic chronic gout of multiple sites without tophus   Okay to refill Allopurinol?

## 2020-09-07 ENCOUNTER — Telehealth: Payer: Self-pay

## 2020-09-07 NOTE — Telephone Encounter (Signed)
Patient called stating he will come to the office tomorrow 09/08/20 to pick up his sample.  Patient is requesting 2 if possible.

## 2020-09-07 NOTE — Telephone Encounter (Signed)
Advised patient I would reserve two mitigare samples for him to pick up tomorrow.

## 2020-09-07 NOTE — Telephone Encounter (Signed)
Last Visit: 08/14/2020 Next Visit: 01/15/2021 Labs: 08/25/2020 CBC WNL, CMP: potassium 3.3, glucose 114, BUN 35, creat 3.05, GFR 19   Medication Samples have been provided to the patient.  Drug name: mitigare      Strength:  0.6mg      Qty: 2 LOT: [id number]  Exp.Date: 01/2021  Dosing instructions: Take 1 capsule by mouth daily as needed.   Jacob Farmer 1:59 PM 09/07/2020   Advised patient to take strictly as needed. Patient verbalized understanding.

## 2020-09-07 NOTE — Telephone Encounter (Signed)
Attempted to contact patient and no answer/voicemail available.   We have received mitigare samples and I was calling the patient to inform so he could come by the office and pick one up.

## 2020-09-08 ENCOUNTER — Other Ambulatory Visit: Payer: Self-pay

## 2020-09-08 DIAGNOSIS — R001 Bradycardia, unspecified: Secondary | ICD-10-CM | POA: Diagnosis not present

## 2020-09-10 ENCOUNTER — Ambulatory Visit
Admission: RE | Admit: 2020-09-10 | Discharge: 2020-09-10 | Disposition: A | Discharge status: Home / Self Care | Payer: Medicare HMO | Source: Ambulatory Visit | Attending: Surgery | Admitting: Surgery

## 2020-09-10 DIAGNOSIS — M4802 Spinal stenosis, cervical region: Secondary | ICD-10-CM | POA: Diagnosis not present

## 2020-09-10 DIAGNOSIS — M47812 Spondylosis without myelopathy or radiculopathy, cervical region: Secondary | ICD-10-CM

## 2020-09-14 ENCOUNTER — Ambulatory Visit: Payer: Medicare HMO | Admitting: Physician Assistant

## 2020-09-20 ENCOUNTER — Ambulatory Visit: Payer: Medicare HMO | Admitting: Specialist

## 2020-09-26 ENCOUNTER — Ambulatory Visit: Payer: Medicare HMO | Admitting: Dermatology

## 2020-09-27 ENCOUNTER — Encounter: Payer: Self-pay | Admitting: Cardiology

## 2020-09-27 ENCOUNTER — Ambulatory Visit: Payer: Medicare HMO | Admitting: Cardiology

## 2020-09-27 VITALS — BP 138/92 | HR 82 | Ht 69.0 in | Wt 171.0 lb

## 2020-09-27 DIAGNOSIS — I951 Orthostatic hypotension: Secondary | ICD-10-CM

## 2020-09-27 DIAGNOSIS — I495 Sick sinus syndrome: Secondary | ICD-10-CM | POA: Diagnosis not present

## 2020-09-27 MED ORDER — HYDRALAZINE HCL 25 MG PO TABS: 25.0000 mg | ORAL_TABLET | Freq: Two times a day (BID) | ORAL | 1 refills | Status: DC

## 2020-09-27 NOTE — Patient Instructions (Addendum)
Your physician recommends that you schedule a follow-up appointment in: [place], NP  Your physician has recommended you make the following change in your medication:   CHANGE HYDRALAZINE 25 MG TWICE DAILY   Thank you for choosing [facility]!!

## 2020-09-27 NOTE — Progress Notes (Signed)
Clinical Summary Jacob Farmer is a 74 y.o.male seen today for follow up of the following medical problems.This is a focused visit on recent history of bradycardia, for more complete history please refer to prior clinic notes.     1. Bradycardia - ER visit9/2021 with bradycardia. HRs mid 30s in nephrology clinic, sent to ER - has had prior issues with bradycardia, we had been weaning his home beta blocker. At ER visit beta blocker was stopped - no significant symptoms at that time, was discharged from ER with outpatient monitor.  - zio patch with frequent supraventricular ectopy, runs of atach. Rare episodes of sinus brady to 30s in AM hours, unclear if sleeping. Of note he reports no significant dizziness episodes while wearing monitor.  - he denies any palpitations.   - has had some recent dizziness episodes.  - episode at daughter in laws of presyncope 1 week ago.  - episode 2 weeks ago of near syncope while in the kitchen.  - poor apptetite with recent weight loss awaiting pcp workup, drinks water regularly.   - drinks glasses of water x 4, juice x 1 bottle, 2 cups of coffee, 1 beer a day, 1 mixed drink.  - has not taken torsemide recent, has not needed.   SH: has had covid vaccine x3  Past Medical History:  Diagnosis Date  . CAD (coronary artery disease)    Acute myocardial infarction treated with TPA in 1990; 1999-stents to circumflex and RCA; residual total occlusion of the left anterior descending; normal ejection fraction; stress nuclear in 2001-distal anteroseptal ischemia; normal LV function  . CKD (chronic kidney disease) stage 4, GFR 15-29 ml/min (HCC)   . First degree heart block   . History of gastric ulcer   . History of kidney stones   . History of MI (myocardial infarction)    1990-  treated w/ TPA  . Hyperlipidemia   . Hypertension   . OA (osteoarthritis)   . Prediabetes   . Prostate cancer ([place])    Stage T1c , Gleason 3+4,  PSA 4.7,  vol 24cc--   scheduled for radiative seed implants  . Psoriatic arthritis ([place])   . RA (rheumatoid arthritis) (HCC)    Dr. Toni Amend  . Sinus bradycardia   . Wears dentures      No Known Allergies   Current Outpatient Medications  Medication Sig Dispense Refill  . allopurinol (ZYLOPRIM) 100 MG tablet TAKE 1/2 TABLET EVERY DAY 45 tablet 0  . amLODipine (NORVASC) 5 MG tablet Take 1 tablet (5 mg total) by mouth daily. 90 tablet 3  . aspirin 81 MG tablet Take 1 tablet (81 mg total) by mouth 3 (three) times a week. 30 tablet   . cholecalciferol (VITAMIN D3) 25 MCG (1000 UNIT) tablet Take 1,000 Units by mouth daily.    . Dupilumab (DUPIXENT [place]) Inject into the skin every 14 (fourteen) days.    . folic acid (FOLVITE) 003 MCG tablet Take 400 mcg by mouth daily.    . hydrALAZINE (APRESOLINE) 25 MG tablet Take 25 mg by mouth 3 (three) times daily.     Marland Kitchen leflunomide (ARAVA) 10 MG tablet Take 1 tablet (10 mg total) by mouth daily. 90 tablet 0  . nitroGLYCERIN (NITROSTAT) 0.4 MG SL tablet Place 0.4 mg under the tongue every 5 (five) minutes as needed for chest pain.     . rosuvastatin (CRESTOR) 40 MG tablet Take 1 tablet (40 mg total) by mouth daily. 90 tablet 3  .  torsemide (DEMADEX) 20 MG tablet Take 1 tablet (20 mg total) by mouth daily as needed. 90 tablet 1  . triamcinolone cream (KENALOG) 0.1 % Apply 1 application topically 3 (three) times daily as needed (itching).     . triamcinolone cream (KENALOG) 0.1 % Apply 1 application topically as needed.     No current facility-administered medications for this visit.     Past Surgical History:  Procedure Laterality Date  . BIOPSY  08/27/2019   Procedure: BIOPSY;  Surgeon: Rogene Houston, MD;  [facility];  Service: Endoscopy;;  gastric  . CARDIOVASCULAR STRESS TEST  2001  per Dr Lattie Haw clinic note   distal anteroseptal ischemia,  normal LVF  . COLONOSCOPY  last one 2006 (approx)  . CORONARY ANGIOPLASTY WITH STENT PLACEMENT  1999   DES x2   to CFX and RCA/  residual total occlusion LAD,  normal LVEF  . ESOPHAGOGASTRODUODENOSCOPY (EGD) WITH PROPOFOL N/A 08/27/2019   Procedure: ESOPHAGOGASTRODUODENOSCOPY (EGD) WITH PROPOFOL;  Surgeon: Rogene Houston, MD;  [facility];  Service: Endoscopy;  Laterality: N/A;  10:10  . EYE SURGERY Bilateral    cataract  . POSTERIOR CERVICAL FUSION/FORAMINOTOMY N/A 05/26/2017   Procedure: RIGHT C4-5 FORAMINOTOMY WITH EXCISION OF HERNIATED [place];  Surgeon: Jessy Oto, MD;  Location: [place];  Service: Orthopedics;  Laterality: N/A;  . RADIOACTIVE SEED IMPLANT N/A 01/11/2016   Procedure: RADIOACTIVE SEED IMPLANT/BRACHYTHERAPY IMPLANT;  Surgeon: Franchot Gallo, MD;  Location: [facility];  Service: Urology;  Laterality: N/A;   73  seeds implanted no seeds founds in bladder     No Known Allergies    Family History  Problem Relation Age of Onset  . Heart attack Mother   . Coronary artery disease Father   . Ulcerative colitis Father   . Ulcers Father   . Breast cancer Sister   . COPD Brother   . Pancreatic cancer Brother   . Healthy Sister   . Diabetes Brother   . Cirrhosis Son   . Seizures Son      Social History Jacob Farmer reports that he quit smoking about 31 years ago. His smoking use included cigarettes. He has a 30.00 pack-year smoking history. He has never used smokeless tobacco. Jacob Farmer reports current alcohol use of about 20.0 standard drinks of alcohol per week.   Review of Systems CONSTITUTIONAL: No weight loss, fever, chills, weakness or fatigue.  HEENT: Eyes: No visual loss, blurred vision, double vision or yellow sclerae.No hearing loss, sneezing, congestion, runny nose or sore throat.  SKIN: No rash or itching.  CARDIOVASCULAR: per hpi RESPIRATORY: No shortness of breath, cough or sputum.  GASTROINTESTINAL: No anorexia, nausea, vomiting or diarrhea. No abdominal pain or blood.  GENITOURINARY: No burning on urination, no  polyuria NEUROLOGICAL: No headache, dizziness, syncope, paralysis, ataxia, numbness or tingling in the extremities. No change in bowel or bladder control.  MUSCULOSKELETAL: No muscle, back pain, joint pain or stiffness.  LYMPHATICS: No enlarged nodes. No history of splenectomy.  PSYCHIATRIC: No history of depression or anxiety.  ENDOCRINOLOGIC: No reports of sweating, cold or heat intolerance. No polyuria or polydipsia.  Marland Kitchen   Physical Examination Today's Vitals   09/27/20 1008  BP: (!) 138/92  Pulse: 82  SpO2: 98%  Weight: 171 lb (77.6 kg)  Height: 5\' 9"  (1.753 m)   Body mass index is 25.25 kg/m.  Gen: resting comfortably, no acute distress HEENT: no scleral icterus, pupils equal round and reactive, no  palptable cervical adenopathy,  CV: RRR, no m/r/g, no jvd Resp: Clear to auscultation bilaterally GI: abdomen is soft, non-tender, non-distended, normal bowel sounds, no hepatosplenomegaly MSK: extremities are warm, no edema.  Skin: warm, no rash Neuro:  no focal deficits Psych: appropriate affect   Diagnostic Studies     Assessment and Plan   1. Tachycardia/bradycardia - off lopressor. Recent monitor with episodes of SVT, rare episodes of sinus brady to 30s. He wore monitor 6 days, reports he did not have a significant episode of dizziness or presyncope with monitor but has had 2 significant episodes of presyncope over the last month - I think symptoms may be brady related and just did not capture on relatively short duration of monitor - he has clear conduction disease by EKG and episodes of sinus brady on the data we do have, will refer to EP to see if warrants pacemaker at this time vs perhaps more extended monitor.   2. Orthostatic hypotension - SBP drops 36 points with standing, DBP 26 points.    Arnoldo Lenis, M.D.

## 2020-10-02 ENCOUNTER — Other Ambulatory Visit: Payer: Self-pay

## 2020-10-02 ENCOUNTER — Encounter: Payer: Self-pay | Admitting: Specialist

## 2020-10-02 ENCOUNTER — Ambulatory Visit: Payer: Medicare HMO | Admitting: Specialist

## 2020-10-02 VITALS — BP 186/93 | HR 98 | Ht 69.0 in | Wt 171.0 lb

## 2020-10-02 DIAGNOSIS — M48062 Spinal stenosis, lumbar region with neurogenic claudication: Secondary | ICD-10-CM | POA: Diagnosis not present

## 2020-10-02 DIAGNOSIS — M549 Dorsalgia, unspecified: Secondary | ICD-10-CM

## 2020-10-02 DIAGNOSIS — M47812 Spondylosis without myelopathy or radiculopathy, cervical region: Secondary | ICD-10-CM

## 2020-10-02 DIAGNOSIS — M542 Cervicalgia: Secondary | ICD-10-CM

## 2020-10-02 NOTE — Progress Notes (Signed)
Office Visit Note   Patient: Jacob Farmer           Date of Birth: [birth year]           MRN: [id number] Visit Date: 10/02/2020              Requested by: Jacob Albee, MD [address] [place],  [name] [address] PCP: Jacob Albee, MD   Assessment & Plan: Visit Diagnoses:  1. Spondylosis without myelopathy or radiculopathy, cervical region   2. Spinal stenosis of lumbar region with neurogenic claudication   3. Mid back pain   4. Cervicalgia     Plan:  No lifting greater than 10 lbs. May use ice or moist heat for pain. Avoid overhead lifting and overhead use of the arms. Do not lift greater than 5 lbs. Adjust head rest in vehicle to prevent hyperextension if rear ended. Take extra precautions to avoid falling.  Handicap license is approved. Dr. Romona Farmer secretary/Assistant will call to arrange for epidural steroid injection   Follow-Up Instructions: No follow-ups on file.   Orders:  No orders of the defined types were placed in this encounter.  No orders of the defined types were placed in this encounter.     Procedures: No procedures performed   Clinical Data: No additional findings.   Subjective: Chief Complaint  Patient presents with  . Neck - Follow-up  . Chest - Follow-up    X-ray F/U    HPI  Review of Systems   Objective: Vital Signs: BP (!) 186/93   Pulse 98   Ht 5\' 9"  (1.753 m)   Wt 171 lb (77.6 kg)   BMI 25.25 kg/m   Physical Exam  Ortho Exam  Specialty Comments:  No specialty comments available.  Imaging: No results found.   PMFS History: Patient Active Problem List   Diagnosis Date Noted  . Herniated cervical disc 05/26/2017    Priority: High    Class: Acute  . History of gastric ulcer   . Itching 09/01/2019  . Abnormal weight loss 07/31/2019  . Abnormal findings on diagnostic imaging of other abdominal regions, including retroperitoneum 07/29/2019  . Fatigue 07/29/2019  . Herniation of cervical  intervertebral disc with radiculopathy 05/26/2017  . Cervical spinal stenosis 05/26/2017  . Lumbar disc disease 02/04/2017  . Primary osteoarthritis of both hands 02/04/2017  . History of gout 02/04/2017  . DDD (degenerative disc disease), lumbar 02/04/2017  . Pain in joint of left knee 02/04/2017  . High risk medication use 01/28/2017  . History of prostate cancer 01/28/2017  . History of renal insufficiency syndrome 01/28/2017  . Malignant neoplasm of prostate ([place]) 11/17/2015  . Stage 3a chronic kidney disease ([place]) 12/27/2011  . Type 2 diabetes mellitus with hyperosmolarity without coma, without long-term current use of insulin ([place]) 12/27/2011  . Rheumatoid arthritis ([place]) 01/04/2011  . SINUS BRADYCARDIA 01/12/2010  . 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 Hyperlipidemia 01/08/2010  . Essential hypertension 01/08/2010   Past Medical History:  Diagnosis Date  . CAD (coronary artery disease)    Acute myocardial infarction treated with TPA in 1990; 1999-stents to circumflex and RCA; residual total occlusion of the left anterior descending; normal ejection fraction; stress nuclear in 2001-distal anteroseptal ischemia; normal LV function  . CKD (chronic kidney disease) stage 4, GFR 15-29 ml/min (HCC)   . First degree heart block   . History of gastric ulcer   . History of kidney stones   . History of  MI (myocardial infarction)    1990-  treated w/ TPA  . Hyperlipidemia   . Hypertension   . OA (osteoarthritis)   . Prediabetes   . Prostate cancer ([place])    Stage T1c , Gleason 3+4,  PSA 4.7,  vol 24cc--  scheduled for radiative seed implants  . Psoriatic arthritis ([place])   . RA (rheumatoid arthritis) (HCC)    Dr. Toni Amend  . Sinus bradycardia   . Wears dentures     Family History  Problem Relation Age of Onset  . Heart attack Mother   . Coronary artery disease Father   . Ulcerative colitis Father   . Ulcers Father   . Breast cancer Sister   . COPD  Brother   . Pancreatic cancer Brother   . Healthy Sister   . Diabetes Brother   . Cirrhosis Son   . Seizures Son     Past Surgical History:  Procedure Laterality Date  . BIOPSY  08/27/2019   Procedure: BIOPSY;  Surgeon: Rogene Houston, MD;  [facility];  Service: Endoscopy;;  gastric  . CARDIOVASCULAR STRESS TEST  2001  per Dr Lattie Haw clinic note   distal anteroseptal ischemia,  normal LVF  . COLONOSCOPY  last one 2006 (approx)  . CORONARY ANGIOPLASTY WITH STENT PLACEMENT  1999   DES x2  to CFX and RCA/  residual total occlusion LAD,  normal LVEF  . ESOPHAGOGASTRODUODENOSCOPY (EGD) WITH PROPOFOL N/A 08/27/2019   Procedure: ESOPHAGOGASTRODUODENOSCOPY (EGD) WITH PROPOFOL;  Surgeon: Rogene Houston, MD;  [facility];  Service: Endoscopy;  Laterality: N/A;  10:10  . EYE SURGERY Bilateral    cataract  . POSTERIOR CERVICAL FUSION/FORAMINOTOMY N/A 05/26/2017   Procedure: RIGHT C4-5 FORAMINOTOMY WITH EXCISION OF HERNIATED [place];  Surgeon: Jessy Oto, MD;  Location: [place];  Service: Orthopedics;  Laterality: N/A;  . RADIOACTIVE SEED IMPLANT N/A 01/11/2016   Procedure: RADIOACTIVE SEED IMPLANT/BRACHYTHERAPY IMPLANT;  Surgeon: Franchot Gallo, MD;  Location: [facility];  Service: Urology;  Laterality: N/A;   73  seeds implanted no seeds founds in bladder   Social History   Occupational History  . Occupation: Retired Clinical biochemist  Tobacco Use  . Smoking status: Former Smoker    Packs/day: 1.00    Years: 30.00    Pack years: 30.00    Types: Cigarettes    Quit date: 05/21/1989    Years since quitting: 31.3  . Smokeless tobacco: Never Used  Vaping Use  . Vaping Use: Never used  Substance and Sexual Activity  . Alcohol use: Yes    Alcohol/week: 20.0 standard drinks    Types: 10 Shots of liquor, 10 Standard drinks or equivalent per week    Comment: 2-3 oz per day  . Drug use: No  . Sexual activity: Not on file

## 2020-10-02 NOTE — Patient Instructions (Signed)
Plan: No lifting greater than 10 lbs. May use ice or moist heat for pain. Avoid overhead lifting and overhead use of the arms. Do not lift greater than 5 lbs. Adjust head rest in vehicle to prevent hyperextension if rear ended. Take extra precautions to avoid falling.  Handicap license is approved. Dr. Romona Curls secretary/Assistant will call to arrange for epidural steroid injection

## 2020-10-09 ENCOUNTER — Telehealth: Payer: Self-pay | Admitting: *Deleted

## 2020-10-09 NOTE — Telephone Encounter (Signed)
-----   Message from Satira Sark, MD sent at 09/27/2020  9:02 AM EDT ----- Patient of Dr. Harl Bowie seen recently by Mr. Leonides Sake NP.  Will forward results to Dr. Harl Bowie for review.

## 2020-10-09 NOTE — Telephone Encounter (Signed)
Pt aware at 10/13 office appt

## 2020-10-12 DIAGNOSIS — E211 Secondary hyperparathyroidism, not elsewhere classified: Secondary | ICD-10-CM | POA: Diagnosis not present

## 2020-10-12 DIAGNOSIS — D631 Anemia in chronic kidney disease: Secondary | ICD-10-CM | POA: Diagnosis not present

## 2020-10-12 DIAGNOSIS — N184 Chronic kidney disease, stage 4 (severe): Secondary | ICD-10-CM | POA: Diagnosis not present

## 2020-10-12 DIAGNOSIS — I9589 Other hypotension: Secondary | ICD-10-CM | POA: Diagnosis not present

## 2020-10-12 DIAGNOSIS — N189 Chronic kidney disease, unspecified: Secondary | ICD-10-CM | POA: Diagnosis not present

## 2020-10-12 DIAGNOSIS — I5032 Chronic diastolic (congestive) heart failure: Secondary | ICD-10-CM | POA: Diagnosis not present

## 2020-10-12 DIAGNOSIS — R001 Bradycardia, unspecified: Secondary | ICD-10-CM | POA: Diagnosis not present

## 2020-10-16 ENCOUNTER — Ambulatory Visit: Payer: Medicare HMO | Admitting: Dermatology

## 2020-10-16 ENCOUNTER — Other Ambulatory Visit: Payer: Self-pay

## 2020-10-16 DIAGNOSIS — L209 Atopic dermatitis, unspecified: Secondary | ICD-10-CM

## 2020-10-16 DIAGNOSIS — I872 Venous insufficiency (chronic) (peripheral): Secondary | ICD-10-CM

## 2020-10-19 DIAGNOSIS — R809 Proteinuria, unspecified: Secondary | ICD-10-CM | POA: Diagnosis not present

## 2020-10-19 DIAGNOSIS — I129 Hypertensive chronic kidney disease with stage 1 through stage 4 chronic kidney disease, or unspecified chronic kidney disease: Secondary | ICD-10-CM | POA: Diagnosis not present

## 2020-10-19 DIAGNOSIS — I5032 Chronic diastolic (congestive) heart failure: Secondary | ICD-10-CM | POA: Diagnosis not present

## 2020-10-19 DIAGNOSIS — N184 Chronic kidney disease, stage 4 (severe): Secondary | ICD-10-CM | POA: Diagnosis not present

## 2020-10-19 DIAGNOSIS — N189 Chronic kidney disease, unspecified: Secondary | ICD-10-CM | POA: Diagnosis not present

## 2020-10-19 DIAGNOSIS — D631 Anemia in chronic kidney disease: Secondary | ICD-10-CM | POA: Diagnosis not present

## 2020-10-22 ENCOUNTER — Encounter: Payer: Self-pay | Admitting: Dermatology

## 2020-10-22 NOTE — Progress Notes (Signed)
   Follow-Up Visit   Subjective  Jacob Farmer is a 74 y.o. male who presents for the following: Follow-up (atopic dermatitis. on dupixent trying to get patient assistant.).  Eczema Location:  Duration:  Quality: Much improved  associated Signs/Symptoms: Modifying Factors: Dupixent Severity:  Timing: Context:   Objective  Well appearing patient in no apparent distress; mood and affect are within normal limits.  All skin waist up examined.  Plus legs   Assessment & Plan    Venous stasis dermatitis of right lower extremity (2) Right Lower Leg - Anterior  Will continue using his triamcinolone cream as needed.   Venous stasis dermatitis of left lower extremity Left Lower Leg - Anterior  Will continue using triamcinolone cream as needed.   Atopic dermatitis, unspecified type (4) Left Forearm - Posterior; Right Forearm - Posterior; Right Abdomen (side) - Upper; Mid Back  Patient is currently using Dupixent. He is better. No problems or concerns.       I, Lavonna Monarch, MD, have reviewed all documentation for this visit.  The documentation on 10/22/20 for the exam, diagnosis, procedures, and orders are all accurate and complete.

## 2020-10-23 ENCOUNTER — Encounter: Payer: Self-pay | Admitting: Physical Medicine and Rehabilitation

## 2020-10-23 ENCOUNTER — Ambulatory Visit: Payer: Medicare HMO | Admitting: Physical Medicine and Rehabilitation

## 2020-10-23 ENCOUNTER — Ambulatory Visit: Payer: Self-pay

## 2020-10-23 ENCOUNTER — Other Ambulatory Visit: Payer: Self-pay

## 2020-10-23 VITALS — BP 150/91 | HR 85

## 2020-10-23 DIAGNOSIS — M5412 Radiculopathy, cervical region: Secondary | ICD-10-CM | POA: Diagnosis not present

## 2020-10-23 DIAGNOSIS — M961 Postlaminectomy syndrome, not elsewhere classified: Secondary | ICD-10-CM

## 2020-10-23 MED ORDER — BETAMETHASONE SOD PHOS & ACET 6 (3-3) MG/ML IJ SUSP
12.0000 mg | Freq: Once | INTRAMUSCULAR | Status: AC
  Administered 2020-10-23: 12 mg

## 2020-10-23 NOTE — Procedures (Signed)
Cervical Epidural Steroid Injection - Interlaminar Approach with Fluoroscopic Guidance  Patient: Jacob Farmer      Date of Birth: Dec 21, 1945 MRN: [id number] PCP: Doree Albee, MD      Visit Date: 10/23/2020   Universal Protocol:    Date/Time: 11/08/211:09 PM  Consent Given By: the patient  Position: PRONE  Additional Comments: Vital signs were monitored before and after the procedure. Patient was prepped and draped in the usual sterile fashion. The correct patient, procedure, and site was verified.   Injection Procedure Details:   Procedure diagnoses:  1. Cervical radiculopathy      Meds Administered:  Meds ordered this encounter  Medications  . betamethasone acetate-betamethasone sodium phosphate (CELESTONE) injection 12 mg     Laterality: Right  Location/Site: C7-T1  Needle: 3.5 in., 20 ga. Tuohy  Needle Placement: Paramedian epidural space  Findings:  -Comments: Excellent flow of contrast into the epidural space.  Procedure Details: Using a paramedian approach from the side mentioned above, the region overlying the inferior lamina was localized under fluoroscopic visualization and the soft tissues overlying this structure were infiltrated with 4 ml. of 1% Lidocaine without Epinephrine. A # 20 gauge, Tuohy needle was inserted into the epidural space using a paramedian approach.  The epidural space was localized using loss of resistance along with contralateral oblique bi-planar fluoroscopic views.  After negative aspirate for air, blood, and CSF, a 2 ml. volume of Isovue-250 was injected into the epidural space and the flow of contrast was observed. Radiographs were obtained for documentation purposes.   The injectate was administered into the level noted above.  Additional Comments:  The patient tolerated the procedure well Dressing: 2 x 2 sterile gauze and Band-Aid    Post-procedure details: Patient was observed during the procedure. Post-procedure  instructions were reviewed.  Patient left the clinic in stable condition.

## 2020-10-23 NOTE — Progress Notes (Signed)
Jacob Farmer - 74 y.o. male MRN [id number]  Date of birth: 1946-10-05  Office Visit Note: Visit Date: 10/23/2020 PCP: Doree Albee, MD Referred by: Doree Albee, MD  Subjective: Chief Complaint  Patient presents with  . Right Shoulder - Pain  . Left Shoulder - Pain   HPI:  Jacob Farmer is a 74 y.o. male who comes in today at the request of Dr. Basil Dess for planned Right C7-T1 Cervical epidural steroid injection with fluoroscopic guidance.  The patient has failed conservative care including home exercise, medications, time and activity modification.  This injection will be diagnostic and hopefully therapeutic.  Please see requesting physician notes for further details and justification.  MRI reviewed with images and spine model.  MRI reviewed in the note below.  Patient with prior history of posterior laminotomy and foraminotomy.  Patient is having right radicular pain into the shoulder blade mostly at night.   ROS Otherwise per HPI.  Assessment & Plan: Visit Diagnoses:  1. Cervical radiculopathy   2. Post laminectomy syndrome     Plan: No additional findings.   Meds & Orders:  Meds ordered this encounter  Medications  . betamethasone acetate-betamethasone sodium phosphate (CELESTONE) injection 12 mg    Orders Placed This Encounter  Procedures  . XR C-ARM NO REPORT  . Epidural Steroid injection    Follow-up: Return for visit to requesting physician as needed.   Procedures: No procedures performed  Cervical Epidural Steroid Injection - Interlaminar Approach with Fluoroscopic Guidance  Patient: Jacob Farmer      Date of Birth: 1946/12/04 MRN: [id number] PCP: Doree Albee, MD      Visit Date: 10/23/2020   Universal Protocol:    Date/Time: 11/08/211:09 PM  Consent Given By: the patient  Position: PRONE  Additional Comments: Vital signs were monitored before and after the procedure. Patient was prepped and draped in the usual sterile  fashion. The correct patient, procedure, and site was verified.   Injection Procedure Details:   Procedure diagnoses:  1. Cervical radiculopathy      Meds Administered:  Meds ordered this encounter  Medications  . betamethasone acetate-betamethasone sodium phosphate (CELESTONE) injection 12 mg     Laterality: Right  Location/Site: C7-T1  Needle: 3.5 in., 20 ga. Tuohy  Needle Placement: Paramedian epidural space  Findings:  -Comments: Excellent flow of contrast into the epidural space.  Procedure Details: Using a paramedian approach from the side mentioned above, the region overlying the inferior lamina was localized under fluoroscopic visualization and the soft tissues overlying this structure were infiltrated with 4 ml. of 1% Lidocaine without Epinephrine. A # 20 gauge, Tuohy needle was inserted into the epidural space using a paramedian approach.  The epidural space was localized using loss of resistance along with contralateral oblique bi-planar fluoroscopic views.  After negative aspirate for air, blood, and CSF, a 2 ml. volume of Isovue-250 was injected into the epidural space and the flow of contrast was observed. Radiographs were obtained for documentation purposes.   The injectate was administered into the level noted above.  Additional Comments:  The patient tolerated the procedure well Dressing: 2 x 2 sterile gauze and Band-Aid    Post-procedure details: Patient was observed during the procedure. Post-procedure instructions were reviewed.  Patient left the clinic in stable condition.     Clinical History: MRI CERVICAL SPINE WITHOUT CONTRAST  TECHNIQUE: Multiplanar, multisequence MR imaging of the cervical spine was performed. No intravenous contrast was administered.  COMPARISON:  Prior study from 05/05/2017.  FINDINGS: Alignment: Straightening with reversal of the normal cervical lordosis. 2 mm anterolisthesis of C2 on C3.  Vertebrae:  Vertebral body height maintained without acute or chronic fracture. Bone marrow signal intensity within normal limits. Few scattered subcentimeter benign hemangiomata noted. No worrisome osseous lesions. No abnormal marrow edema.  Cord: Normal signal and morphology.  Posterior Fossa, vertebral arteries, paraspinal tissues: Visualized brain and posterior fossa within normal limits. Craniocervical junction normal. Postoperative scarring noted within the posterior paraspinous soft tissues. Normal flow voids seen within the vertebral arteries bilaterally.  Disc levels:  C2-C3: Trace anterolisthesis. Right eccentric disc bulge with right-sided uncovertebral and facet hypertrophy. Flattening of the ventral thecal sac without significant spinal stenosis. Moderate right C3 foraminal narrowing, progressed from previous. Left neural foramen remains patent.  C3-C4: Diffuse disc osteophyte complex with intervertebral disc space narrowing. Disc osteophyte eccentric to the right. Superimposed mild left greater than right facet hypertrophy. Flattening of the ventral thecal sac without significant spinal stenosis. Severe left with moderate right C4 foraminal narrowing.  C4-C5: Postoperative changes from prior right laminectomy and foraminotomy. Persistent right foraminal disc osteophyte complex (series 7, image 12). Additional broad posterior component flattens the ventral thecal sac with secondary mild cord flattening. Superimposed mild facet and ligament flavum hypertrophy. Mild spinal stenosis. Severe right with mild left C5 foraminal narrowing.  C5-C6: Circumferential disc bulge with uncovertebral spurring. Broad posterior disc osteophyte flattens and effaces the ventral thecal sac resultant mild spinal stenosis. Moderate right with mild left C6 foraminal narrowing.  C6-C7: Circumferential disc bulge with left greater than right uncovertebral spurring. Flattening of the ventral  thecal sac without significant spinal stenosis. Moderate left with mild right C7 foraminal narrowing.  C7-T1: Mild to moderate left greater than right facet hypertrophy. Minimal annular disc bulge. No spinal stenosis. Foramina remain patent.  Visualized upper thoracic spine demonstrates mild noncompressive disc bulging at T1-2 and T2-3.  IMPRESSION: 1. Postoperative changes from prior right laminectomy and foraminotomy at C4-5. Residual and/or recurrent right foraminal disc osteophyte complex at this level with persistent severe right C5 foraminal stenosis. 2. Right eccentric disc osteophyte complex at C3-4 with resultant severe left and moderate right C4 foraminal stenosis. 3. Advanced right-sided uncovertebral and facet degeneration at C2-3 with resultant moderate right C3 foraminal stenosis. 4. 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   Electronically Signed   By: Jeannine Boga M.D.   On: 09/11/2020 05:32     Objective:  VS:  HT:    WT:   BMI:     BP:(!) 150/91  HR:85bpm  TEMP: ( )  RESP:  Physical Exam Vitals and nursing note reviewed.  Constitutional:      General: He is not in acute distress.    Appearance: Normal appearance. He is not ill-appearing.  HENT:     Head: Normocephalic and atraumatic.     Right Ear: External ear normal.     Left Ear: External ear normal.  Eyes:     Extraocular Movements: Extraocular movements intact.  Cardiovascular:     Rate and Rhythm: Normal rate.     Pulses: Normal pulses.  Abdominal:     General: There is no distension.     Palpations: Abdomen is soft.  Musculoskeletal:        General: No signs of injury.     Cervical back: Neck supple. Tenderness present. No rigidity.     Right lower  leg: No edema.     Left lower leg: No edema.     Comments: Patient has good strength in the upper extremities with 5 out of 5 strength in wrist extension  long finger flexion APB.  No intrinsic hand muscle atrophy.  Negative Hoffmann's test.  Lymphadenopathy:     Cervical: No cervical adenopathy.  Skin:    Findings: No erythema or rash.  Neurological:     General: No focal deficit present.     Mental Status: He is alert and oriented to person, place, and time.     Sensory: No sensory deficit.     Motor: No weakness or abnormal muscle tone.     Coordination: Coordination normal.  Psychiatric:        Mood and Affect: Mood normal.        Behavior: Behavior normal.      Imaging: XR C-ARM NO REPORT  Result Date: 10/23/2020 Please see Notes tab for imaging impression.

## 2020-10-23 NOTE — Progress Notes (Signed)
Pt state between both shoulder pain that travels on his right side. Pt state the pain comes at night and keeps him up. Pt state he takes pain meds to help ease his pain.  Numeric Pain Rating Scale and Functional Assessment Average Pain 2   In the last MONTH (on 0-10 scale) has pain interfered with the following?  1. General activity like being  able to carry out your everyday physical activities such as walking, climbing stairs, carrying groceries, or moving a chair?  Rating(4)   +Driver, -BT, -Dye Allergies.

## 2020-10-26 ENCOUNTER — Ambulatory Visit: Payer: Medicare HMO | Admitting: Specialist

## 2020-10-30 ENCOUNTER — Telehealth: Payer: Self-pay | Admitting: Rheumatology

## 2020-10-30 ENCOUNTER — Other Ambulatory Visit: Payer: Self-pay | Admitting: *Deleted

## 2020-10-30 DIAGNOSIS — Z79899 Other long term (current) drug therapy: Secondary | ICD-10-CM

## 2020-10-30 DIAGNOSIS — M1A09X0 Idiopathic chronic gout, multiple sites, without tophus (tophi): Secondary | ICD-10-CM

## 2020-10-30 NOTE — Telephone Encounter (Signed)
Lab Orders released.  

## 2020-10-30 NOTE — Telephone Encounter (Signed)
Patient's spouse calling to request lab orders to be sent to [facility] in [place] for lab draw.

## 2020-11-01 DIAGNOSIS — I5032 Chronic diastolic (congestive) heart failure: Secondary | ICD-10-CM | POA: Diagnosis not present

## 2020-11-01 DIAGNOSIS — I129 Hypertensive chronic kidney disease with stage 1 through stage 4 chronic kidney disease, or unspecified chronic kidney disease: Secondary | ICD-10-CM | POA: Diagnosis not present

## 2020-11-01 DIAGNOSIS — Z79899 Other long term (current) drug therapy: Secondary | ICD-10-CM | POA: Diagnosis not present

## 2020-11-01 DIAGNOSIS — R809 Proteinuria, unspecified: Secondary | ICD-10-CM | POA: Diagnosis not present

## 2020-11-01 DIAGNOSIS — M1A09X0 Idiopathic chronic gout, multiple sites, without tophus (tophi): Secondary | ICD-10-CM | POA: Diagnosis not present

## 2020-11-01 DIAGNOSIS — N184 Chronic kidney disease, stage 4 (severe): Secondary | ICD-10-CM | POA: Diagnosis not present

## 2020-11-01 DIAGNOSIS — D631 Anemia in chronic kidney disease: Secondary | ICD-10-CM | POA: Diagnosis not present

## 2020-11-01 DIAGNOSIS — N189 Chronic kidney disease, unspecified: Secondary | ICD-10-CM | POA: Diagnosis not present

## 2020-11-02 ENCOUNTER — Encounter (HOSPITAL_COMMUNITY): Payer: Self-pay | Admitting: *Deleted

## 2020-11-02 ENCOUNTER — Emergency Department (HOSPITAL_COMMUNITY): Payer: Medicare HMO

## 2020-11-02 ENCOUNTER — Ambulatory Visit: Payer: Medicare HMO | Admitting: Specialist

## 2020-11-02 ENCOUNTER — Emergency Department (HOSPITAL_COMMUNITY)
Admission: EM | Admit: 2020-11-02 | Discharge: 2020-11-02 | Disposition: A | Discharge status: Home / Self Care | Payer: Medicare HMO | Attending: Emergency Medicine | Admitting: Emergency Medicine

## 2020-11-02 ENCOUNTER — Other Ambulatory Visit: Payer: Self-pay

## 2020-11-02 ENCOUNTER — Telehealth: Payer: Self-pay | Admitting: *Deleted

## 2020-11-02 DIAGNOSIS — R7989 Other specified abnormal findings of blood chemistry: Secondary | ICD-10-CM

## 2020-11-02 DIAGNOSIS — I129 Hypertensive chronic kidney disease with stage 1 through stage 4 chronic kidney disease, or unspecified chronic kidney disease: Secondary | ICD-10-CM | POA: Insufficient documentation

## 2020-11-02 DIAGNOSIS — R748 Abnormal levels of other serum enzymes: Secondary | ICD-10-CM | POA: Diagnosis not present

## 2020-11-02 DIAGNOSIS — Z9861 Coronary angioplasty status: Secondary | ICD-10-CM | POA: Diagnosis not present

## 2020-11-02 DIAGNOSIS — E1122 Type 2 diabetes mellitus with diabetic chronic kidney disease: Secondary | ICD-10-CM | POA: Diagnosis not present

## 2020-11-02 DIAGNOSIS — N270 Small kidney, unilateral: Secondary | ICD-10-CM | POA: Diagnosis not present

## 2020-11-02 DIAGNOSIS — Z7982 Long term (current) use of aspirin: Secondary | ICD-10-CM | POA: Insufficient documentation

## 2020-11-02 DIAGNOSIS — Z87891 Personal history of nicotine dependence: Secondary | ICD-10-CM | POA: Insufficient documentation

## 2020-11-02 DIAGNOSIS — N200 Calculus of kidney: Secondary | ICD-10-CM | POA: Diagnosis not present

## 2020-11-02 DIAGNOSIS — Z7984 Long term (current) use of oral hypoglycemic drugs: Secondary | ICD-10-CM | POA: Diagnosis not present

## 2020-11-02 DIAGNOSIS — N184 Chronic kidney disease, stage 4 (severe): Secondary | ICD-10-CM | POA: Insufficient documentation

## 2020-11-02 DIAGNOSIS — Z79899 Other long term (current) drug therapy: Secondary | ICD-10-CM | POA: Insufficient documentation

## 2020-11-02 DIAGNOSIS — R945 Abnormal results of liver function studies: Secondary | ICD-10-CM | POA: Diagnosis not present

## 2020-11-02 DIAGNOSIS — I700 Atherosclerosis of aorta: Secondary | ICD-10-CM | POA: Diagnosis not present

## 2020-11-02 DIAGNOSIS — I2510 Atherosclerotic heart disease of native coronary artery without angina pectoris: Secondary | ICD-10-CM | POA: Diagnosis not present

## 2020-11-02 HISTORY — DX: Unspecified jaundice: R17

## 2020-11-02 LAB — COMPREHENSIVE METABOLIC PANEL
ALT: 801 U/L — ABNORMAL HIGH (ref 0–44)
AST: 528 U/L — ABNORMAL HIGH (ref 15–41)
Albumin: 2.6 g/dL — ABNORMAL LOW (ref 3.5–5.0)
Alkaline Phosphatase: 353 U/L — ABNORMAL HIGH (ref 38–126)
Anion gap: 8 (ref 5–15)
BUN: 28 mg/dL — ABNORMAL HIGH (ref 8–23)
CO2: 25 mmol/L (ref 22–32)
Calcium: 8.4 mg/dL — ABNORMAL LOW (ref 8.9–10.3)
Chloride: 102 mmol/L (ref 98–111)
Creatinine, Ser: 2.17 mg/dL — ABNORMAL HIGH (ref 0.61–1.24)
GFR, Estimated: 31 mL/min — ABNORMAL LOW (ref >60)
Glucose, Bld: 137 mg/dL — ABNORMAL HIGH (ref 70–99)
Potassium: 4 mmol/L (ref 3.5–5.1)
Sodium: 135 mmol/L (ref 135–145)
Total Bilirubin: 0.7 mg/dL (ref 0.3–1.2)
Total Protein: 6.4 g/dL — ABNORMAL LOW (ref 6.5–8.1)

## 2020-11-02 LAB — CBC WITH DIFFERENTIAL/PLATELET
Abs Immature Granulocytes: 0.05 10*3/uL (ref 0.00–0.07)
Absolute Monocytes: 864 cells/uL (ref 200–950)
Basophils Absolute: 38 cells/uL (ref 0–200)
Basophils Absolute: 0 10*3/uL (ref 0.0–0.1)
Basophils Relative: 0.6 %
Basophils Relative: 1 %
Eosinophils Absolute: 294 cells/uL (ref 15–500)
Eosinophils Absolute: 0.3 10*3/uL (ref 0.0–0.5)
Eosinophils Relative: 4.6 %
Eosinophils Relative: 4 %
HCT: 35 % — ABNORMAL LOW (ref 38.5–50.0)
HCT: 33.9 % — ABNORMAL LOW (ref 39.0–52.0)
Hemoglobin: 11.1 g/dL — ABNORMAL LOW (ref 13.2–17.1)
Hemoglobin: 10.5 g/dL — ABNORMAL LOW (ref 13.0–17.0)
Immature Granulocytes: 1 %
Lymphocytes Relative: 8 %
Lymphs Abs: 698 cells/uL — ABNORMAL LOW (ref 850–3900)
Lymphs Abs: 0.5 10*3/uL — ABNORMAL LOW (ref 0.7–4.0)
MCH: 27.1 pg (ref 27.0–33.0)
MCH: 27.8 pg (ref 26.0–34.0)
MCHC: 31.7 g/dL — ABNORMAL LOW (ref 32.0–36.0)
MCHC: 31 g/dL (ref 30.0–36.0)
MCV: 85.6 fL (ref 80.0–100.0)
MCV: 89.7 fL (ref 80.0–100.0)
MPV: 11.4 fL (ref 7.5–12.5)
Monocytes Absolute: 0.7 10*3/uL (ref 0.1–1.0)
Monocytes Relative: 13.5 %
Monocytes Relative: 12 %
Neutro Abs: 4506 cells/uL (ref 1500–7800)
Neutro Abs: 4.8 10*3/uL (ref 1.7–7.7)
Neutrophils Relative %: 70.4 %
Neutrophils Relative %: 74 %
Platelets: 227 10*3/uL (ref 140–400)
Platelets: 208 10*3/uL (ref 150–400)
RBC: 4.09 10*6/uL — ABNORMAL LOW (ref 4.20–5.80)
RBC: 3.78 MIL/uL — ABNORMAL LOW (ref 4.22–5.81)
RDW: 13.3 % (ref 11.0–15.0)
RDW: 14.6 % (ref 11.5–15.5)
Total Lymphocyte: 10.9 %
WBC: 6.4 10*3/uL (ref 3.8–10.8)
WBC: 6.4 10*3/uL (ref 4.0–10.5)
nRBC: 0 % (ref 0.0–0.2)

## 2020-11-02 LAB — URINALYSIS, ROUTINE W REFLEX MICROSCOPIC
Bacteria, UA: NONE SEEN
Bilirubin Urine: NEGATIVE
Glucose, UA: NEGATIVE mg/dL
Hgb urine dipstick: NEGATIVE
Ketones, ur: NEGATIVE mg/dL
Leukocytes,Ua: NEGATIVE
Nitrite: NEGATIVE
Protein, ur: 100 mg/dL — AB
Specific Gravity, Urine: 1.017 (ref 1.005–1.030)
pH: 6 (ref 5.0–8.0)

## 2020-11-02 LAB — COMPLETE METABOLIC PANEL WITH GFR
AG Ratio: 1.1 (calc) (ref 1.0–2.5)
ALT: 1509 U/L — ABNORMAL HIGH (ref 9–46)
AST: 2058 U/L — ABNORMAL HIGH (ref 10–35)
Albumin: 3.1 g/dL — ABNORMAL LOW (ref 3.6–5.1)
Alkaline phosphatase (APISO): 430 U/L — ABNORMAL HIGH (ref 35–144)
BUN/Creatinine Ratio: 13 (calc) (ref 6–22)
BUN: 27 mg/dL — ABNORMAL HIGH (ref 7–25)
CO2: 25 mmol/L (ref 20–32)
Calcium: 8.9 mg/dL (ref 8.6–10.3)
Chloride: 103 mmol/L (ref 98–110)
Creat: 2.04 mg/dL — ABNORMAL HIGH (ref 0.70–1.18)
GFR, Est African American: 36 mL/min/{1.73_m2} — ABNORMAL LOW (ref >=60)
GFR, Est Non African American: 31 mL/min/{1.73_m2} — ABNORMAL LOW (ref >=60)
Globulin: 2.7 g/dL (calc) (ref 1.9–3.7)
Glucose, Bld: 56 mg/dL — ABNORMAL LOW (ref 65–99)
Potassium: 4.6 mmol/L (ref 3.5–5.3)
Sodium: 139 mmol/L (ref 135–146)
Total Bilirubin: 0.7 mg/dL (ref 0.2–1.2)
Total Protein: 5.8 g/dL — ABNORMAL LOW (ref 6.1–8.1)

## 2020-11-02 LAB — HEPATITIS PANEL, ACUTE
HCV Ab: NONREACTIVE
Hep A IgM: NONREACTIVE
Hep B C IgM: NONREACTIVE
Hepatitis B Surface Ag: NONREACTIVE

## 2020-11-02 LAB — URIC ACID: Uric Acid, Serum: 4.6 mg/dL (ref 4.0–8.0)

## 2020-11-02 NOTE — ED Notes (Signed)
Pt educated on the need for a urine sample and is in agreement at this time.

## 2020-11-02 NOTE — ED Notes (Signed)
Entered room and introduced self to patient. At the time of assuming care, bed is locked in the lowest position, side rails x1 at patient request, call bell within reach. This RN notes no signs of acute distress, respirations are even and unlabored with equal chest rise and fall. All questions and concerns voiced addressed at this time. Educated on call light and hourly rounding and is in agreement at this time.

## 2020-11-02 NOTE — ED Notes (Signed)
Pt provided with discharge education and all questions and concerns voiced addressed at this time.

## 2020-11-02 NOTE — Discharge Instructions (Addendum)
Stop taking your allopurinol and your Crestor and follow-up with Dr. Melony Overly next week.   Return to the lab department here at any [facility] on Monday to get your liver studies drawn

## 2020-11-02 NOTE — Progress Notes (Signed)
LFTs are very high.  I tried calling patient but there was no response.  There was no answer machine available.  Please try to call patient again.  We will refer him to gastroenterology.

## 2020-11-02 NOTE — ED Provider Notes (Signed)
[facility] EMERGENCY DEPARTMENT Provider Note   CSN: [id number] Arrival date & time: 11/02/20  1126     History Chief Complaint  Patient presents with  . Abnormal Lab    Jacob Farmer is a 74 y.o. male.  Patient sent here for elevated liver enzymes.  Patient was seen by rheumatologist who got liver enzymes that were elevated and then sent him to the emergency department patient has no complaints  The history is provided by the patient and medical records. No language interpreter was used.  Weakness Severity:  Mild Onset quality:  Unable to specify Timing:  Intermittent Progression:  Resolved Chronicity:  New Context: not alcohol use   Relieved by:  Nothing Worsened by:  Nothing Ineffective treatments:  None tried Associated symptoms: no abdominal pain, no chest pain, no cough, no diarrhea, no frequency, no headaches and no seizures        Past Medical History:  Diagnosis Date  . CAD (coronary artery disease)    Acute myocardial infarction treated with TPA in 1990; 1999-stents to circumflex and RCA; residual total occlusion of the left anterior descending; normal ejection fraction; stress nuclear in 2001-distal anteroseptal ischemia; normal LV function  . CKD (chronic kidney disease) stage 4, GFR 15-29 ml/min (HCC)   . First degree heart block   . History of gastric ulcer   . History of kidney stones   . History of MI (myocardial infarction)    1990-  treated w/ TPA  . Hyperlipidemia   . Hypertension   . Jaundice   . OA (osteoarthritis)   . Prediabetes   . Prostate cancer ([place])    Stage T1c , Gleason 3+4,  PSA 4.7,  vol 24cc--  scheduled for radiative seed implants  . Psoriatic arthritis ([place])   . RA (rheumatoid arthritis) (HCC)    Dr. Toni Amend  . Sinus bradycardia   . Wears dentures     Patient Active Problem List   Diagnosis Date Noted  . History of gastric ulcer   . Itching 09/01/2019  . Abnormal weight loss 07/31/2019  . Abnormal findings on  diagnostic imaging of other abdominal regions, including retroperitoneum 07/29/2019  . Fatigue 07/29/2019  . Herniated cervical disc 05/26/2017    Class: Acute  . Herniation of cervical intervertebral disc with radiculopathy 05/26/2017  . Cervical spinal stenosis 05/26/2017  . Lumbar disc disease 02/04/2017  . Primary osteoarthritis of both hands 02/04/2017  . History of gout 02/04/2017  . DDD (degenerative disc disease), lumbar 02/04/2017  . Pain in joint of left knee 02/04/2017  . High risk medication use 01/28/2017  . History of prostate cancer 01/28/2017  . History of renal insufficiency syndrome 01/28/2017  . Malignant neoplasm of prostate ([place]) 11/17/2015  . Stage 3a chronic kidney disease ([place]) 12/27/2011  . Type 2 diabetes mellitus with hyperosmolarity without coma, without long-term current use of insulin ([place]) 12/27/2011  . Rheumatoid arthritis ([place]) 01/04/2011  . SINUS BRADYCARDIA 01/12/2010  . 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 Hyperlipidemia 01/08/2010  . Essential hypertension 01/08/2010    Past Surgical History:  Procedure Laterality Date  . BIOPSY  08/27/2019   Procedure: BIOPSY;  Surgeon: Rogene Houston, MD;  Location: [facility];  Service: Endoscopy;;  gastric  . CARDIOVASCULAR STRESS TEST  2001  per Dr Lattie Haw clinic note   distal anteroseptal ischemia,  normal LVF  . COLONOSCOPY  last one 2006 (approx)  . [place]th[place]   DES x2  to CFX and RCA/  residual total occlusion LAD,  normal LVEF  . ESOPHAGOGASTRODUODENOSCOPY (EGD) WITH PROPOFOL N/A 08/27/2019   Procedure: ESOPHAGOGASTRODUODENOSCOPY (EGD) WITH PROPOFOL;  Surgeon: Rogene Houston, MD;  Location: [facility];  Service: Endoscopy;  Laterality: N/A;  10:10  . EYE SURGERY Bilateral    cataract  . POSTERIOR CERVICAL FUSION/FORAMINOTOMY N/A 05/26/2017   Procedure: RIGHT C4-5 FORAMINOTOMY WITH EXCISION OF HERNIATED [place];  Surgeon: Jessy Oto, MD;  Location: [place];  Service: Orthopedics;  Laterality: N/A;  . RADIOACTIVE SEED IMPLANT N/A 01/11/2016   Procedure: RADIOACTIVE SEED IMPLANT/BRACHYTHERAPY IMPLANT;  Surgeon: Franchot Gallo, MD;  Location: [facility];  Service: Urology;  Laterality: N/A;   50  seeds implanted no seeds founds in bladder       Family History  Problem Relation Age of Onset  . Heart attack Mother   . Coronary artery disease Father   . Ulcerative colitis Father   . Ulcers Father   . Breast cancer Sister   . COPD Brother   . Pancreatic cancer Brother   . Healthy Sister   . Diabetes Brother   . Cirrhosis Son   . Seizures Son     Social History   Tobacco Use  . Smoking status: Former Smoker    Packs/day: 1.00    Years: 30.00    Pack years: 30.00    Types: Cigarettes    Quit date: 05/21/1989    Years since quitting: 31.4  . Smokeless tobacco: Never Used  Vaping Use  . Vaping Use: Never used  Substance Use Topics  . Alcohol use: Yes    Alcohol/week: 20.0 standard drinks    Types: 10 Shots of liquor, 10 Standard drinks or equivalent per week    Comment: 2-3 oz per day  . Drug use: No    Home Medications Prior to Admission medications   Medication Sig Start Date End Date Taking? Authorizing Provider  allopurinol (ZYLOPRIM) 100 MG tablet TAKE 1/2 TABLET EVERY DAY 09/04/20  Yes Ofilia Neas, PA-C  amLODipine (NORVASC) 10 MG tablet Take 5 mg by mouth daily.    Yes [provider]  aspirin 81 MG tablet Take 1 tablet (81 mg total) by mouth 3 (three) times a week. 08/28/19  Yes Rehman, Mechele Dawley, MD  cholecalciferol (VITAMIN D3) 25 MCG (1000 UNIT) tablet Take 1,000 Units by mouth daily.   Yes [provider]  Dupilumab (DUPIXENT [place]) Inject into the skin every 14 (fourteen) days.   Yes [provider]  folic acid (FOLVITE) 751 MCG tablet Take 400 mcg by mouth daily.   Yes [provider]  glimepiride (AMARYL) 1 MG tablet Take 1 mg by mouth  daily with breakfast.   Yes [provider]  hydrALAZINE (APRESOLINE) 25 MG tablet Take 1 tablet (25 mg total) by mouth in the morning and at bedtime. 09/27/20 09/27/21 Yes Branch, Alphonse Guild, MD  leflunomide (ARAVA) 10 MG tablet Take 1 tablet (10 mg total) by mouth daily. 08/14/20  Yes Deveshwar, Abel Presto, MD  nitroGLYCERIN (NITROSTAT) 0.4 MG SL tablet Place 0.4 mg under the tongue every 5 (five) minutes as needed for chest pain.  08/03/19  Yes [provider]  rosuvastatin (CRESTOR) 40 MG tablet Take 1 tablet (40 mg total) by mouth daily. 09/01/20 11/30/20 Yes Verta Ellen., NP  torsemide (DEMADEX) 20 MG tablet Take 1 tablet (20 mg total) by mouth daily as needed. 07/19/20 10/17/20  Arnoldo Lenis, MD  triamcinolone cream (KENALOG) 0.1 % Apply 1 application topically 3 (three) times daily as needed (itching).  Patient not taking: Reported on 11/02/2020 06/22/19   [provider]    Allergies    Patient has no known allergies.  Review of Systems   Review of Systems  Constitutional: Negative for appetite change and fatigue.  HENT: Negative for congestion, ear discharge and sinus pressure.   Eyes: Negative for discharge.  Respiratory: Negative for cough.   Cardiovascular: Negative for chest pain.  Gastrointestinal: Negative for abdominal pain and diarrhea.  Genitourinary: Negative for frequency and hematuria.  Musculoskeletal: Negative for back pain.  Skin: Negative for rash.  Neurological: Positive for weakness. Negative for seizures and headaches.  Psychiatric/Behavioral: Negative for hallucinations.    Physical Exam Updated Vital Signs BP (!) 155/82 (BP Location: Right Arm)   Pulse 79   Temp 98.5 F (36.9 C) (Oral)   Resp 20   Ht 5\' 10"  (1.778 m)   Wt 79.4 kg   SpO2 97%   BMI 25.11 kg/m   Physical Exam Vitals reviewed.  Constitutional:      Appearance: He is well-developed.  HENT:     Head: Normocephalic.     Nose: Nose normal.  Eyes:      General: No scleral icterus.    Conjunctiva/sclera: Conjunctivae normal.  Neck:     Thyroid: No thyromegaly.  Cardiovascular:     Rate and Rhythm: Normal rate and regular rhythm.     Heart sounds: No murmur heard.  No friction rub. No gallop.   Pulmonary:     Breath sounds: No stridor. No wheezing or rales.  Chest:     Chest wall: No tenderness.  Abdominal:     General: There is no distension.     Tenderness: There is no abdominal tenderness. There is no rebound.  Musculoskeletal:        General: Normal range of motion.     Cervical back: Neck supple.  Lymphadenopathy:     Cervical: No cervical adenopathy.  Skin:    Findings: No erythema or rash.  Neurological:     Mental Status: He is alert and oriented to person, place, and time.     Motor: No abnormal muscle tone.     Coordination: Coordination normal.  Psychiatric:        Behavior: Behavior normal.     ED Results / Procedures / Treatments   Labs (all labs ordered are listed, but only abnormal results are displayed) Labs Reviewed  COMPREHENSIVE METABOLIC PANEL - Abnormal; Notable for the following components:      Result Value   Glucose, Bld 137 (*)    BUN 28 (*)    Creatinine, Ser 2.17 (*)    Calcium 8.4 (*)    Total Protein 6.4 (*)    Albumin 2.6 (*)    AST 528 (*)    ALT 801 (*)    Alkaline Phosphatase 353 (*)    GFR, Estimated 31 (*)    All other components within normal limits  CBC WITH DIFFERENTIAL/PLATELET - Abnormal; Notable for the following components:   RBC 3.78 (*)    Hemoglobin 10.5 (*)    HCT 33.9 (*)    Lymphs Abs 0.5 (*)    All other components within normal limits  URINALYSIS, ROUTINE W REFLEX MICROSCOPIC - Abnormal; Notable for the following components:   Protein, ur 100 (*)    All other components within normal limits  HEPATITIS PANEL, ACUTE  EKG None  Radiology No results found.  Procedures Procedures (including critical care time)  Medications Ordered in ED Medications -  No data to display  ED Course  I have reviewed the triage vital signs and the nursing notes.  Pertinent labs & imaging results that were available during my care of the patient were reviewed by me and considered in my medical decision making (see chart for details).    MDM Rules/Calculators/A&P                          Patient with elevated liver enzymes that have improved.  I spoke with Dr. Gala Romney and we will stop his Crestor and his allopurinol and he will follow-up with Dr. Laural Golden Final Clinical Impression(s) / ED Diagnoses Final diagnoses:  Elevated liver enzymes    Rx / DC Orders ED Discharge Orders    None       Milton Ferguson, MD 11/06/20 [phone]

## 2020-11-02 NOTE — ED Notes (Signed)
Ultrasound at bedside at this time.

## 2020-11-02 NOTE — Telephone Encounter (Signed)
-----   Message from Bo Merino, MD sent at 11/02/2020  9:06 AM EST ----- LFTs are very high.  I tried calling patient but there was no response.  There was no answer machine available.  Please try to call patient again.  We will refer him to gastroenterology.

## 2020-11-02 NOTE — ED Triage Notes (Signed)
Pt instructed to come to ED for abnormal liver results.  Pt with hx of jaundice as child.  Pt admits to one drink a day.

## 2020-11-02 NOTE — ED Notes (Signed)
Pt up and ambulatory to restroom with a stand by assistance from staff.

## 2020-11-03 NOTE — Progress Notes (Signed)
I called to check on patient.  He states he went to the emergency room yesterday and the repeat LFTs were better but not normal.  He was advised to discontinue Crestor and allopurinol.  He states he started Crestor a few months back.  He has been on leflunomide and allopurinol for a long time.  I advised him to take allopurinol every other day.  He is trying to schedule an appointment with his previous gastroenterologist.  He is hoping to see him next week.

## 2020-11-06 ENCOUNTER — Other Ambulatory Visit (HOSPITAL_COMMUNITY)
Admission: RE | Admit: 2020-11-06 | Discharge: 2020-11-06 | Disposition: A | Discharge status: Home / Self Care | Payer: Medicare HMO | Source: Other Acute Inpatient Hospital | Attending: Physician Assistant | Admitting: Physician Assistant

## 2020-11-06 ENCOUNTER — Other Ambulatory Visit: Payer: Self-pay

## 2020-11-06 ENCOUNTER — Telehealth (HOSPITAL_COMMUNITY): Payer: Self-pay | Admitting: Physician Assistant

## 2020-11-06 DIAGNOSIS — R748 Abnormal levels of other serum enzymes: Secondary | ICD-10-CM | POA: Insufficient documentation

## 2020-11-06 LAB — HEPATIC FUNCTION PANEL
ALT: 217 U/L — ABNORMAL HIGH (ref 0–44)
AST: 57 U/L — ABNORMAL HIGH (ref 15–41)
Albumin: 2.9 g/dL — ABNORMAL LOW (ref 3.5–5.0)
Alkaline Phosphatase: 254 U/L — ABNORMAL HIGH (ref 38–126)
Bilirubin, Direct: 0.1 mg/dL (ref 0.0–0.2)
Indirect Bilirubin: 0.4 mg/dL (ref 0.3–0.9)
Total Bilirubin: 0.5 mg/dL (ref 0.3–1.2)
Total Protein: 6.8 g/dL (ref 6.5–8.1)

## 2020-11-06 NOTE — Progress Notes (Signed)
Cardiology Office Note  Date: 11/07/2020   ID: Jacob Farmer, DOB 24-Dec-1945, MRN [id number]  PCP:  Doree Albee, MD  Cardiologist:  Carlyle Dolly, MD Electrophysiologist:  None   Chief Complaint: Follow-up sinus bradycardia, orthostatic hypotension  History of Present Illness: Jacob Farmer is a 74 y.o. male with a history of sinus bradycardia, orthostatic hypotension.  Last encounter with Dr. Harl Bowie 09/27/2020 for sinus bradycardia and orthostatic hypotension.  Been experiencing some heart rates in his mid 57s in nephrology clinic.  He was sent to the emergency room.  Had prior issues of bradycardia.  His beta-blocker was in the process of being weaned.  While in the ER during the September visit his beta-blocker was stopped.  He was discharged from the ER with outpatient monitor with frequent supraventricular ectopy and runs of atrial tachycardia.  Rare episodes of sinus bradycardia.  He reported no significant dizziness during the time of wearing monitor he denied any palpitations.  Had experienced 2 recent presyncopal/near syncopal episodes a few weeks prior to visit.  He noted decreasing appetite and weight loss.  He was pending PCP work-up.  He was referred to EP to see if his symptoms warranted a pacemaker.  Recent visit to the [facility] emergency room for elevated liver enzymes.  He was seen by his rheumatologist who ordered the liver function tests and sent him to the emergency department.  He denied any complaints.  His AST was 528, ALT was 801, alkaline phosphatase was 353.  Creatinine 2.17, GFR 31, hemoglobin 10.5, hematocrit 33.9.  Case was discussed with Dr. Gala Romney who recommended stopping Crestor and allopurinol.  He was to follow-up with Dr. Laural Golden.   He presents today with no particular complaints.  He denies any orthostatic hypotension or bradycardic episodes.  Denies any dizziness, lightheadedness, presyncopal or syncopal episodes.  He states he believes to  receiving the Covid booster vaccine may have been the cause of some of his symptoms at the time he was experiencing them.  He denies any further episodes.  His Crestor and allopurinol was stopped due to significantly elevated LFTs.  Had repeat LFTs yesterday showing improvement of AST to 57 from 528.  ALT was 217 from previous 801.  He has an upcoming follow-up with Dr. Laural Golden.  He states rheumatologist believes this was due to taking Crestor.  He denies any anginal or exertional symptoms, palpitations or arrhythmias, CVA or TIA-like symptoms, PND, orthopnea, claudication-like symptoms, DVT or PE-like symptoms, or lower extremity edema.   Past Medical History:  Diagnosis Date  . CAD (coronary artery disease)    Acute myocardial infarction treated with TPA in 1990; 1999-stents to circumflex and RCA; residual total occlusion of the left anterior descending; normal ejection fraction; stress nuclear in 2001-distal anteroseptal ischemia; normal LV function  . CKD (chronic kidney disease) stage 4, GFR 15-29 ml/min (HCC)   . First degree heart block   . History of gastric ulcer   . History of kidney stones   . History of MI (myocardial infarction)    1990-  treated w/ TPA  . Hyperlipidemia   . Hypertension   . Jaundice   . OA (osteoarthritis)   . Prediabetes   . Prostate cancer ([place])    Stage T1c , Gleason 3+4,  PSA 4.7,  vol 24cc--  scheduled for radiative seed implants  . Psoriatic arthritis ([place])   . RA (rheumatoid arthritis) (HCC)    Dr. Toni Amend  . Sinus bradycardia   .  Wears dentures     Past Surgical History:  Procedure Laterality Date  . BIOPSY  08/27/2019   Procedure: BIOPSY;  Surgeon: Rogene Houston, MD;  [facility];  Service: Endoscopy;;  gastric  . CARDIOVASCULAR STRESS TEST  2001  per Dr Lattie Haw clinic note   distal anteroseptal ischemia,  normal LVF  . COLONOSCOPY  last one 2006 (approx)  . CORONARY ANGIOPLASTY WITH STENT PLACEMENT  1999   DES x2  to CFX and  RCA/  residual total occlusion LAD,  normal LVEF  . ESOPHAGOGASTRODUODENOSCOPY (EGD) WITH PROPOFOL N/A 08/27/2019   Procedure: ESOPHAGOGASTRODUODENOSCOPY (EGD) WITH PROPOFOL;  Surgeon: Rogene Houston, MD;  [facility];  Service: Endoscopy;  Laterality: N/A;  10:10  . EYE SURGERY Bilateral    cataract  . POSTERIOR CERVICAL FUSION/FORAMINOTOMY N/A 05/26/2017   Procedure: RIGHT C4-5 FORAMINOTOMY WITH EXCISION OF HERNIATED [place];  Surgeon: Jessy Oto, MD;  Location: [facility];  Service: Orthopedics;  Laterality: N/A;  . RADIOACTIVE SEED IMPLANT N/A 01/11/2016   Procedure: RADIOACTIVE SEED IMPLANT/BRACHYTHERAPY IMPLANT;  Surgeon: Franchot Gallo, MD;  Location: [facility];  Service: Urology;  Laterality: N/A;   73  seeds implanted no seeds founds in bladder    Current Outpatient Medications  Medication Sig Dispense Refill  . allopurinol (ZYLOPRIM) 100 MG tablet Take 50 mg by mouth every other day.    Marland Kitchen amLODipine (NORVASC) 10 MG tablet Take 5 mg by mouth daily.     Marland Kitchen aspirin 81 MG tablet Take 1 tablet (81 mg total) by mouth 3 (three) times a week. 30 tablet   . cholecalciferol (VITAMIN D3) 25 MCG (1000 UNIT) tablet Take 1,000 Units by mouth daily.    . Dupilumab (DUPIXENT [place]) Inject into the skin every 14 (fourteen) days.    . folic acid (FOLVITE) 149 MCG tablet Take 400 mcg by mouth daily.    Marland Kitchen glimepiride (AMARYL) 1 MG tablet Take 1 mg by mouth daily with breakfast.    . hydrALAZINE (APRESOLINE) 25 MG tablet Take 1 tablet (25 mg total) by mouth in the morning and at bedtime. 180 tablet 1  . leflunomide (ARAVA) 10 MG tablet Take 1 tablet (10 mg total) by mouth daily. 90 tablet 0  . losartan (COZAAR) 25 MG tablet Take 12.5 mg by mouth daily.    . nitroGLYCERIN (NITROSTAT) 0.4 MG SL tablet Place 0.4 mg under the tongue every 5 (five) minutes as needed for chest pain.     Marland Kitchen triamcinolone (KENALOG) 0.1 % Apply 1 application topically. Three times a week.     No current  facility-administered medications for this visit.   Allergies:  Patient has no known allergies.   Social History: The patient  reports that he quit smoking about 31 years ago. His smoking use included cigarettes. He has a 30.00 pack-year smoking history. He has never used smokeless tobacco. He reports current alcohol use of about 20.0 standard drinks of alcohol per week. He reports that he does not use drugs.   Family History: The patient's family history includes Breast cancer in his sister; COPD in his brother; Cirrhosis in his son; Coronary artery disease in his father; Diabetes in his brother; Healthy in his sister; Heart attack in his mother; Pancreatic cancer in his brother; Seizures in his son; Ulcerative colitis in his father; Ulcers in his father.   ROS:  Please see the history of present illness. Otherwise, complete review of systems is positive for none.  All other systems are reviewed and negative.   Physical Exam: VS:  BP 140/88   Pulse 82   Ht 5\' 10"  (1.778 m)   Wt 170 lb (77.1 kg)   SpO2 98%   BMI 24.39 kg/m , BMI Body mass index is 24.39 kg/m.  Wt Readings from Last 3 Encounters:  11/07/20 170 lb (77.1 kg)  11/02/20 175 lb (79.4 kg)  10/02/20 171 lb (77.6 kg)    General: Patient appears comfortable at rest. Neck: Supple, no elevated JVP or carotid bruits, no thyromegaly. Lungs: Clear to auscultation, nonlabored breathing at rest. Cardiac: Regular rate and rhythm, no S3 or significant systolic murmur, no pericardial rub. Extremities: No pitting edema, distal pulses 2+. Skin: Warm and dry. Musculoskeletal: No kyphosis. Neuropsychiatric: Alert and oriented x3, affect grossly appropriate.  ECG:   EKG 08/25/2020 [facility] emergency department sinus rhythm rate of 63, supraventricular bigeminy, sinus pause, prolonged PR greater than 0.22, RBBB, LPF B  Recent Labwork: 08/08/2020: TSH 2.52 08/25/2020: Magnesium 2.4 11/02/2020: BUN 28; Creatinine, Ser 2.17; Hemoglobin  10.5; Platelets 208; Potassium 4.0; Sodium 135 11/06/2020: ALT 217; AST 57     Component Value Date/Time   CHOL 141 05/12/2020 0804   TRIG 136 05/12/2020 0804   HDL 35 (L) 05/12/2020 0804   CHOLHDL 4.0 05/12/2020 0804   VLDL 44 (H) 01/05/2011 2022   [place] 83 05/12/2020 0804    Other Studies Reviewed Today:  7-day Zio patch 09/08/2020 Study Highlights   7 day zio patch  Min HR 32, Max HR 136, Avg HR 69  Frequent supraventricular ectopy in the form of isolated PACs (14%), couplets (2.6%), triplets (1.8%).Short runs of atrial tachycardia  Rare ventricular ectopy in the form of isolated PVCs and couplets  Episode of severe sinus bradycardia to 32 at 7AM, unclear if sleeping.  No symptoms reported    Echocardiogram 07/20/2020 1. Left ventricular ejection fraction, by estimation, is 55 to 60%. The left ventricle has normal function. The left ventricle has no regional wall motion abnormalities. There is severe left ventricular hypertrophy. Left ventricular diastolic parameters are consistent with Grade II diastolic dysfunction (pseudonormalization). Elevated left atrial pressure. 2. Right ventricular systolic function is normal. The right ventricular size is normal. There is mildly elevated pulmonary artery systolic pressure. 3. The mitral valve is normal in structure. Mild mitral valve regurgitation. No evidence of mitral stenosis. 4. The aortic valve is tricuspid. Aortic valve regurgitation is mild. No aortic stenosis is present. 5. The inferior vena cava is normal in size with greater than 50% respiratory variability, suggesting right atrial pressure of 3 mmHg.  Assessment and Plan:  1. Tachycardia-bradycardia syndrome ([place])   2. Orthostatic hypotension    1. Tachycardia-bradycardia syndrome ([place]) Recent cardiac monitorDemonstrated a minimum heart rate of 32, maximal heart rate 136, average heart rate 69.  Frequent supraventricular ectopy in the form of PACs of 14%,  couplets 2.6%, triplet 1.8%.  Short runs of atrial tachycardia rare ventricular ectopy and form of isolated PVCs and couplets.  Had one episode of severe sinus bradycardia at 32.    2. Orthostatic hypotension No more episodes of orthostatic hypotension.  Blood pressure ranging in the 983J to 825K systolic per patient.  Heart rate today is 82.  Beta-blocker was discontinued previously.  Patient states he believes some of the dizziness was attributable to his Covid booster vaccine.   3.  Elevated liver function tests Patient had previously been switched to Crestor.  Subsequently had elevated LFTs which have  been coming down since discontinuation of Crestor and allopurinol . His Crestor and allopurinol was stopped due to significantly elevated LFTs.  Had repeat LFTs yesterday showing improvement of AST to 57 from 528.  ALT was 217 from previous 801.  He has an upcoming follow-up with Dr. Laural Golden.  He states rheumatologist believes this was due to taking Crestor.   Medication Adjustments/Labs and Tests Ordered: Current medicines are reviewed at length with the patient today.  Concerns regarding medicines are outlined above.   Disposition: Follow-up with Dr. Harl Bowie 6 months in [place] office  Signed, Levell July, NP 11/07/2020 11:49 AM    [facility] [place], [place], [place] [address] Phone: [phone]; Fax: [phone]

## 2020-11-06 NOTE — Telephone Encounter (Signed)
Pt request lft order

## 2020-11-07 ENCOUNTER — Other Ambulatory Visit: Payer: Self-pay

## 2020-11-07 ENCOUNTER — Ambulatory Visit: Payer: Medicare HMO | Admitting: Family Medicine

## 2020-11-07 ENCOUNTER — Encounter: Payer: Self-pay | Admitting: Family Medicine

## 2020-11-07 ENCOUNTER — Encounter (INDEPENDENT_AMBULATORY_CARE_PROVIDER_SITE_OTHER): Payer: Self-pay | Admitting: *Deleted

## 2020-11-07 VITALS — BP 140/88 | HR 82 | Ht 70.0 in | Wt 170.0 lb

## 2020-11-07 DIAGNOSIS — I495 Sick sinus syndrome: Secondary | ICD-10-CM

## 2020-11-07 DIAGNOSIS — I951 Orthostatic hypotension: Secondary | ICD-10-CM

## 2020-11-07 MED ORDER — LOSARTAN POTASSIUM 25 MG PO TABS
12.5000 mg | ORAL_TABLET | Freq: Every day | ORAL | 3 refills | Status: DC
Start: 2020-11-07 — End: 2021-11-19

## 2020-11-07 NOTE — Patient Instructions (Addendum)
Medication Instructions:  Continue all current medications.   Labwork: none  Testing/Procedures: none  Follow-Up: 6 months   Any Other Special Instructions Will Be Listed Below (If Applicable).   If you need a refill on your cardiac medications before your next appointment, please call your pharmacy.  

## 2020-11-08 ENCOUNTER — Ambulatory Visit (INDEPENDENT_AMBULATORY_CARE_PROVIDER_SITE_OTHER): Payer: Medicare HMO | Admitting: Gastroenterology

## 2020-11-08 ENCOUNTER — Encounter (INDEPENDENT_AMBULATORY_CARE_PROVIDER_SITE_OTHER): Payer: Self-pay | Admitting: Gastroenterology

## 2020-11-08 VITALS — BP 139/75 | HR 79 | Temp 97.9°F | Ht 70.0 in | Wt 171.0 lb

## 2020-11-08 DIAGNOSIS — R7989 Other specified abnormal findings of blood chemistry: Secondary | ICD-10-CM

## 2020-11-08 NOTE — Patient Instructions (Signed)
Perform blood workup  

## 2020-11-08 NOTE — Progress Notes (Signed)
[name], M.D. Gastroenterology & Hepatology [facility] [address] [place], [place] [address]  Primary Care Physician: Wilson Singer, MD [address] [place] [address]  I will communicate my assessment and recommendations to the referring MD via EMR. "Note: Occasional unusual wording and randomly placed punctuation marks may result from the use of speech recognition technology to transcribe this document"  Problems: 1. Elevated liver function tests.  History of Present Illness: Jacob Farmer is a 74 y.o. male with MH MI and CAD s/p stents x2, prostate cancer s/p radiation therapy, RA, history of kidney stones, hyperlipidemia, hypertension, prediabetes, who presents for evaluation of elevated liver function tests.  Patient reported he underwent blood testing as part of the routine testing by his rheumatologist on 11/01/2020 and he was told to go to the ER to be seen emergently.  The patient states that he was feeling fine and denied having any symptomatology or any complaints, although he noted that his urine was darker and he was feeling a little bit more tired a few days before this test was performed.  Was found to have elevated aminotransferases with AST 2058 and ALT 1509, ALK PHOS was 430 , TB 0.7, normal electrolytes, Cr 2.04.  The patient went to the ER the next day and had repeat blood testing that showed improvement of his aminotransferases with AST of 528, ALT 801, at risk of at least 353, total bilirubin 0.7, had negative acute otitis panel.  A liver ultrasound was performed which was negative for any acute hepatobiliary alterations.  Notably, the patient had repeat blood testing on 11/06/2020 which showed improvement of his liver enzymes with albumin 2.9, AST 57, ALT 217, there bilirubin 0.4 and alkaline phosphatase 254.  Regarding his previous medications, the patient reports that he  took Tylenol 1 tablet every  couple of weeks for some abdominal pain but not more often than that.  He denies taking any herbal supplements or over-the-counter medications in the last few months.  He reports that he was started on Crestor 2 months ago but was advised to stop his medication by his cardiologist as they considered this could be reason why he had elevation of his aminotransferases.  Patient reports that he received his COVID vaccine booster couple weeks before all these events happened.  The patient denies having any nausea, vomiting, fever, chills, hematochezia, melena, hematemesis, abdominal distention, abdominal pain, diarrhea, jaundice, pruritus or weight loss.  He denies any sick contacts or recent travel.  Past Medical History: Past Medical History:  Diagnosis Date  . CAD (coronary artery disease)    Acute myocardial infarction treated with TPA in 1990; 1999-stents to circumflex and RCA; residual total occlusion of the left anterior descending; normal ejection fraction; stress nuclear in 2001-distal anteroseptal ischemia; normal LV function  . CKD (chronic kidney disease) stage 4, GFR 15-29 ml/min (HCC)   . First degree heart block   . History of gastric ulcer   . History of kidney stones   . History of MI (myocardial infarction)    1990-  treated w/ TPA  . Hyperlipidemia   . Hypertension   . Jaundice   . OA (osteoarthritis)   . Prediabetes   . Prostate cancer (HCC)    Stage T1c , Gleason 3+4,  PSA 4.7,  vol 24cc--  scheduled for radiative seed implants  . Psoriatic arthritis (HCC)   . RA (rheumatoid arthritis) (HCC)    Dr. Beatrix Fetters  .  Sinus bradycardia   . Wears dentures     Past Surgical History: Past Surgical History:  Procedure Laterality Date  . BIOPSY  08/27/2019   Procedure: BIOPSY;  Surgeon: Rogene Houston, MD;  [facility];  Service: Endoscopy;;  gastric  . CARDIOVASCULAR STRESS TEST  2001  per Dr Lattie Haw clinic note   distal anteroseptal ischemia,  normal LVF  .  COLONOSCOPY  last one 2006 (approx)  . CORONARY ANGIOPLASTY WITH STENT PLACEMENT  1999   DES x2  to CFX and RCA/  residual total occlusion LAD,  normal LVEF  . ESOPHAGOGASTRODUODENOSCOPY (EGD) WITH PROPOFOL N/A 08/27/2019   Procedure: ESOPHAGOGASTRODUODENOSCOPY (EGD) WITH PROPOFOL;  Surgeon: Rogene Houston, MD;  [facility];  Service: Endoscopy;  Laterality: N/A;  10:10  . EYE SURGERY Bilateral    cataract  . POSTERIOR CERVICAL FUSION/FORAMINOTOMY N/A 05/26/2017   Procedure: RIGHT C4-5 FORAMINOTOMY WITH EXCISION OF HERNIATED [place];  Surgeon: Jessy Oto, MD;  Location: [facility];  Service: Orthopedics;  Laterality: N/A;  . RADIOACTIVE SEED IMPLANT N/A 01/11/2016   Procedure: RADIOACTIVE SEED IMPLANT/BRACHYTHERAPY IMPLANT;  Surgeon: Franchot Gallo, MD;  Location: [facility];  Service: Urology;  Laterality: N/A;   32  seeds implanted no seeds founds in bladder    Family History: Family History  Problem Relation Age of Onset  . Heart attack Mother   . Coronary artery disease Father   . Ulcerative colitis Father   . Ulcers Father   . Breast cancer Sister   . COPD Brother   . Pancreatic cancer Brother   . Healthy Sister   . Diabetes Brother   . Cirrhosis Son   . Seizures Son     Social History: Social History   Tobacco Use  Smoking Status Former Smoker  . Packs/day: 1.00  . Years: 30.00  . Pack years: 30.00  . Types: Cigarettes  . Quit date: 05/21/1989  . Years since quitting: 31.4  Smokeless Tobacco Never Used   Social History   Substance and Sexual Activity  Alcohol Use Yes  . Alcohol/week: 20.0 standard drinks  . Types: 10 Shots of liquor, 10 Standard drinks or equivalent per week   Comment: 1 beer per day   Social History   Substance and Sexual Activity  Drug Use No    Allergies: No Known Allergies  Medications: Current Outpatient Medications  Medication Sig Dispense Refill  . allopurinol (ZYLOPRIM) 100 MG tablet Take 50 mg by  mouth every other day.    Marland Kitchen amLODipine (NORVASC) 10 MG tablet Take 5 mg by mouth daily.     Marland Kitchen aspirin 81 MG tablet Take 1 tablet (81 mg total) by mouth 3 (three) times a week. 30 tablet   . cholecalciferol (VITAMIN D3) 25 MCG (1000 UNIT) tablet Take 1,000 Units by mouth daily.    . Dupilumab (DUPIXENT [place]) Inject into the skin every 14 (fourteen) days.    . folic acid (FOLVITE) 388 MCG tablet Take 400 mcg by mouth daily.    Marland Kitchen glimepiride (AMARYL) 1 MG tablet Take 1 mg by mouth daily with breakfast.    . hydrALAZINE (APRESOLINE) 25 MG tablet Take 1 tablet (25 mg total) by mouth in the morning and at bedtime. 180 tablet 1  . leflunomide (ARAVA) 10 MG tablet Take 1 tablet (10 mg total) by mouth daily. 90 tablet 0  . losartan (COZAAR) 25 MG tablet Take 0.5 tablets (12.5 mg total) by mouth daily. 45 tablet 3  .  nitroGLYCERIN (NITROSTAT) 0.4 MG SL tablet Place 0.4 mg under the tongue every 5 (five) minutes as needed for chest pain.     Marland Kitchen triamcinolone (KENALOG) 0.1 % Apply 1 application topically. Three times a week.     No current facility-administered medications for this visit.    Review of Systems: GENERAL: negative for malaise, night sweats HEENT: No changes in hearing or vision, no nose bleeds or other nasal problems. NECK: Negative for lumps, goiter, pain and significant neck swelling RESPIRATORY: Negative for cough, wheezing CARDIOVASCULAR: Negative for chest pain, leg swelling, palpitations, orthopnea GI: SEE HPI MUSCULOSKELETAL: Negative for joint pain or swelling, back pain, and muscle pain. SKIN: Negative for lesions, rash PSYCH: Negative for sleep disturbance, mood disorder and recent psychosocial stressors. HEMATOLOGY Negative for prolonged bleeding, bruising easily, and swollen nodes. ENDOCRINE: Negative for cold or heat intolerance, polyuria, polydipsia and goiter. NEURO: negative for tremor, gait imbalance, syncope and seizures. The remainder of the review of systems is  noncontributory.   Physical Exam: BP 139/75 (BP Location: Right Arm, Patient Position: Sitting, Cuff Size: Large)   Pulse 79   Temp 97.9 F (36.6 C) (Oral)   Ht $R'5\' 10"'hN$  (1.778 m)   Wt 171 lb (77.6 kg)   BMI 24.54 kg/m  GENERAL: The patient is AO x3, in no acute distress. HEENT: Head is normocephalic and atraumatic. EOMI are intact. Mouth is well hydrated and without lesions. NECK: Supple. No masses LUNGS: Clear to auscultation. No presence of rhonchi/wheezing/rales. Adequate chest expansion HEART: RRR, normal s1 and s2. ABDOMEN: Soft, nontender, no guarding, no peritoneal signs, and nondistended. BS +. No masses. EXTREMITIES: Without any cyanosis, clubbing, rash, lesions or edema. NEUROLOGIC: AOx3, no focal motor deficit. SKIN: no jaundice, no rashes  Imaging/Labs: as above  I personally reviewed and interpreted the available labs, imaging and endoscopic files.  Impression and Plan: Jacob Farmer is a 74 y.o. male with MH MI and CAD s/p stents x2, prostate cancer s/p radiation therapy, RA, history of kidney stones, hyperlipidemia, hypertension, prediabetes, who presents for evaluation of elevated liver function tests.  The patient presented severe elevation of his liver enzymes, presenting hepatocellular pattern predominantly.  However, he has been asymptomatic and fortunately his liver enzymes have improved during the last few days.  I do not consider that his alterations were related to the intake of statin as these medications usually cause a transient mild increase in his aminotransferases but never reached the thousands.  Other medications the patient is currently taking are not associated with an increased risk of significant elevation of liver enzymes.  He has not presented any hypotensive events or syncopal events, I think shock liver is less likely.  It is possible that he presented an acute episode of viral infection that led to an increase in his aminotransferases but this has  improved (this could include CMV or EBV).  Among differential, autoimmune diseases remains as an option but it is uncommon for it to resolve on its own.  Nevertheless, we will obtain serologies to rule out metabolic and autoimmune etiologies, will also check for possible rhabdomyolysis as a cause of the elevated liver enzymes.  Patient understood and agreed.  - Check CMP, iron panel, ANA, AMA, ASMA, IgG in one week  All questions were answered.      Harvel Quale, MD Gastroenterology and Hepatology [facility]

## 2020-11-13 ENCOUNTER — Ambulatory Visit (INDEPENDENT_AMBULATORY_CARE_PROVIDER_SITE_OTHER): Payer: Medicare HMO | Admitting: Internal Medicine

## 2020-11-13 ENCOUNTER — Encounter (INDEPENDENT_AMBULATORY_CARE_PROVIDER_SITE_OTHER): Payer: Self-pay | Admitting: Internal Medicine

## 2020-11-13 ENCOUNTER — Other Ambulatory Visit: Payer: Self-pay | Admitting: Family Medicine

## 2020-11-13 ENCOUNTER — Other Ambulatory Visit: Payer: Self-pay

## 2020-11-13 VITALS — BP 144/70 | HR 68 | Temp 97.3°F | Resp 18 | Ht 70.0 in | Wt 170.8 lb

## 2020-11-13 DIAGNOSIS — D649 Anemia, unspecified: Secondary | ICD-10-CM | POA: Diagnosis not present

## 2020-11-13 DIAGNOSIS — R945 Abnormal results of liver function studies: Secondary | ICD-10-CM

## 2020-11-13 DIAGNOSIS — I2510 Atherosclerotic heart disease of native coronary artery without angina pectoris: Secondary | ICD-10-CM | POA: Diagnosis not present

## 2020-11-13 DIAGNOSIS — I10 Essential (primary) hypertension: Secondary | ICD-10-CM | POA: Diagnosis not present

## 2020-11-13 DIAGNOSIS — E1100 Type 2 diabetes mellitus with hyperosmolarity without nonketotic hyperglycemic-hyperosmolar coma (NKHHC): Secondary | ICD-10-CM

## 2020-11-13 DIAGNOSIS — R7989 Other specified abnormal findings of blood chemistry: Secondary | ICD-10-CM | POA: Diagnosis not present

## 2020-11-13 DIAGNOSIS — D6481 Anemia due to antineoplastic chemotherapy: Secondary | ICD-10-CM | POA: Diagnosis not present

## 2020-11-13 NOTE — Progress Notes (Signed)
Metrics: Intervention Frequency ACO  Documented Smoking Status Yearly  Screened one or more times in 24 months  Cessation Counseling or  Active cessation medication Past 24 months  Past 24 months   Guideline developer: UpToDate (See UpToDate for funding source) Date Released: 2014       Wellness Office Visit  Subjective:  Patient ID: Jacob Farmer, male    DOB: 11/23/1946  Age: 74 y.o. MRN: [id number]  CC: This man comes in for follow-up of hypertension, diabetes and more recently significantly abnormal liver enzymes. HPI  The cause of the elevation in liver enzymes is not entirely clear.  Thankfully, the liver enzymes are significantly improving.  It is possible he had some sort of viral illness that is now improving.  He tells me that a lot of his symptoms including the elevation in liver enzymes started after COVID-19 booster dose.  I told him that I am not aware that this is a side effect of the COVID-19 vaccine.  It is unlikely that a statin is causing this because the elevation was so high. He feels well.  He has begun to take Amaryl again because he noticed that his blood glucose levels were elevated.  However in the face of chronic kidney disease, I have told him that I would not recommend Amaryl.  His last hemoglobin A1c was 6.3% which is not too bad of a control for his age. Past Medical History:  Diagnosis Date  . CAD (coronary artery disease)    Acute myocardial infarction treated with TPA in 1990; 1999-stents to circumflex and RCA; residual total occlusion of the left anterior descending; normal ejection fraction; stress nuclear in 2001-distal anteroseptal ischemia; normal LV function  . CKD (chronic kidney disease) stage 4, GFR 15-29 ml/min (HCC)   . First degree heart block   . History of gastric ulcer   . History of kidney stones   . History of MI (myocardial infarction)    1990-  treated w/ TPA  . Hyperlipidemia   . Hypertension   . Jaundice   . OA (osteoarthritis)     . Prediabetes   . Prostate cancer ([employer])    Stage T1c , Gleason 3+4,  PSA 4.7,  vol 24cc--  scheduled for radiative seed implants  . Psoriatic arthritis ([place])   . RA (rheumatoid arthritis) (HCC)    Dr. Toni Amend  . Sinus bradycardia   . Wears dentures    Past Surgical History:  Procedure Laterality Date  . BIOPSY  08/27/2019   Procedure: BIOPSY;  Surgeon: Rogene Houston, MD;  [facility];  Service: Endoscopy;;  gastric  . CARDIOVASCULAR STRESS TEST  2001  per Dr Lattie Haw clinic note   distal anteroseptal ischemia,  normal LVF  . COLONOSCOPY  last one 2006 (approx)  . CORONARY ANGIOPLASTY WITH STENT PLACEMENT  1999   DES x2  to CFX and RCA/  residual total occlusion LAD,  normal LVEF  . ESOPHAGOGASTRODUODENOSCOPY (EGD) WITH PROPOFOL N/A 08/27/2019   Procedure: ESOPHAGOGASTRODUODENOSCOPY (EGD) WITH PROPOFOL;  Surgeon: Rogene Houston, MD;  [facility];  Service: Endoscopy;  Laterality: N/A;  10:10  . EYE SURGERY Bilateral    cataract  . POSTERIOR CERVICAL FUSION/FORAMINOTOMY N/A 05/26/2017   Procedure: RIGHT C4-5 FORAMINOTOMY WITH EXCISION OF HERNIATED [place];  Surgeon: Jessy Oto, MD;  Location: [place];  Service: Orthopedics;  Laterality: N/A;  . RADIOACTIVE SEED IMPLANT N/A 01/11/2016   Procedure: RADIOACTIVE SEED IMPLANT/BRACHYTHERAPY IMPLANT;  Surgeon: Franchot Gallo,  MD;  Location: [place];  Service: Urology;  Laterality: N/A;   7  seeds implanted no seeds founds in bladder     Family History  Problem Relation Age of Onset  . Heart attack Mother   . Coronary artery disease Father   . Ulcerative colitis Father   . Ulcers Father   . Breast cancer Sister   . COPD Brother   . Pancreatic cancer Brother   . Healthy Sister   . Diabetes Brother   . Cirrhosis Son   . 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   Social History   Tobacco Use  . Smoking status:  Former Smoker    Packs/day: 1.00    Years: 30.00    Pack years: 30.00    Types: Cigarettes    Quit date: 05/21/1989    Years since quitting: 31.5  . Smokeless tobacco: Never Used  Substance Use Topics  . Alcohol use: Yes    Alcohol/week: 20.0 standard drinks    Types: 10 Shots of liquor, 10 Standard drinks or equivalent per week    Comment: 1 beer per day    Current Meds  Medication Sig  . allopurinol (ZYLOPRIM) 100 MG tablet Take 50 mg by mouth every other day.  Marland Kitchen amLODipine (NORVASC) 10 MG tablet Take 5 mg by mouth daily.   Marland Kitchen aspirin 81 MG tablet Take 1 tablet (81 mg total) by mouth 3 (three) times a week.  . cholecalciferol (VITAMIN D3) 25 MCG (1000 UNIT) tablet Take 1,000 Units by mouth daily.  . Dupilumab (DUPIXENT [place]) Inject into the skin every 14 (fourteen) days.  . folic acid (FOLVITE) 751 MCG tablet Take 400 mcg by mouth daily.  Marland Kitchen glimepiride (AMARYL) 1 MG tablet Take 1 mg by mouth daily with breakfast.  . hydrALAZINE (APRESOLINE) 25 MG tablet Take 1 tablet (25 mg total) by mouth in the morning and at bedtime.  Marland Kitchen leflunomide (ARAVA) 10 MG tablet Take 1 tablet (10 mg total) by mouth daily.  Marland Kitchen losartan (COZAAR) 25 MG tablet Take 0.5 tablets (12.5 mg total) by mouth daily.  . nitroGLYCERIN (NITROSTAT) 0.4 MG SL tablet Place 0.4 mg under the tongue every 5 (five) minutes as needed for chest pain.   Marland Kitchen triamcinolone (KENALOG) 0.1 % Apply 1 application topically. Three times a week.      Depression screen [facility] 2/9 08/16/2020 08/16/2020 03/28/2020 09/28/2019 09/01/2019  Decreased Interest 0 0 0 0 0  Down, Depressed, Hopeless 0 0 0 0 0  PHQ - 2 Score 0 0 0 0 0  Some recent data might be hidden     Objective:   Today's Vitals: BP (!) 144/70 (BP Location: Left Arm, Patient Position: Sitting, Cuff Size: Normal)   Pulse 68   Temp (!) 97.3 F (36.3 C) (Temporal)   Resp 18   Ht 5\' 10"  (1.778 m)   Wt 170 lb 12.8 oz (77.5 kg)   SpO2 98%   BMI 24.51 kg/m  Vitals with BMI 11/13/2020  11/08/2020 11/07/2020  Height 5\' 10"  5\' 10"  5\' 10"   Weight 170 lbs 13 oz 171 lbs 170 lbs  BMI 24.51 70.01 74.94  Systolic 496 759 163  Diastolic 70 75 88  Pulse 68 79 82     Physical Exam  He looks systemically well.  Weight is stable.  Blood pressure acceptable.     Assessment   1. Essential hypertension  2. Type 2 diabetes mellitus with hyperosmolarity without coma, without long-term current use of insulin ([place])   3. Abnormal liver function tests       Tests ordered No orders of the defined types were placed in this encounter.    Plan: 1. He will continue with antihypertensive therapy as before. 2. I recommended he discontinue Amaryl and we discussed intermittent fasting combined more with the plant-based diet, ensuring that he is well-hydrated at least 80 to 90 ounces of water per day. 3. He had blood work done to recheck his liver enzymes by his gastroenterologist. 4. Follow-up with Judson Roch in about 3 to 4 months time.   No orders of the defined types were placed in this encounter.   Doree Albee, MD

## 2020-11-13 NOTE — Progress Notes (Signed)
Patient was to see Dr. Laural Golden, his gastroenterologist.  Please advise patient to have the notes forwarded to [place] from his gastroenterologist.

## 2020-11-15 ENCOUNTER — Ambulatory Visit (INDEPENDENT_AMBULATORY_CARE_PROVIDER_SITE_OTHER): Payer: Medicare HMO | Admitting: Internal Medicine

## 2020-11-15 LAB — COMPREHENSIVE METABOLIC PANEL
AG Ratio: 1.1 (calc) (ref 1.0–2.5)
ALT: 54 U/L — ABNORMAL HIGH (ref 9–46)
AST: 27 U/L (ref 10–35)
Albumin: 3.5 g/dL — ABNORMAL LOW (ref 3.6–5.1)
Alkaline phosphatase (APISO): 195 U/L — ABNORMAL HIGH (ref 35–144)
BUN/Creatinine Ratio: 13 (calc) (ref 6–22)
BUN: 28 mg/dL — ABNORMAL HIGH (ref 7–25)
CO2: 26 mmol/L (ref 20–32)
Calcium: 9.1 mg/dL (ref 8.6–10.3)
Chloride: 104 mmol/L (ref 98–110)
Creat: 2.15 mg/dL — ABNORMAL HIGH (ref 0.70–1.18)
Globulin: 3.1 g/dL (calc) (ref 1.9–3.7)
Glucose, Bld: 81 mg/dL (ref 65–139)
Potassium: 4.9 mmol/L (ref 3.5–5.3)
Sodium: 140 mmol/L (ref 135–146)
Total Bilirubin: 0.3 mg/dL (ref 0.2–1.2)
Total Protein: 6.6 g/dL (ref 6.1–8.1)

## 2020-11-15 LAB — IRON, TOTAL/TOTAL IRON BINDING CAP
%SAT: 13 % (calc) — ABNORMAL LOW (ref 20–48)
Iron: 43 ug/dL — ABNORMAL LOW (ref 50–180)
TIBC: 329 mcg/dL (calc) (ref 250–425)

## 2020-11-15 LAB — PROTIME-INR
INR: 1
Prothrombin Time: 10.3 s (ref 9.0–11.5)

## 2020-11-15 LAB — ANTI-NUCLEAR AB-TITER (ANA TITER)
ANA TITER: 1:80 {titer} — ABNORMAL HIGH
ANA Titer 1: 1:40 {titer} — ABNORMAL HIGH

## 2020-11-15 LAB — ANA: Anti Nuclear Antibody (ANA): POSITIVE — AB

## 2020-11-15 LAB — FERRITIN: Ferritin: 386 ng/mL — ABNORMAL HIGH (ref 24–380)

## 2020-11-15 LAB — CBC
HCT: 34.7 % — ABNORMAL LOW (ref 38.5–50.0)
Hemoglobin: 11.3 g/dL — ABNORMAL LOW (ref 13.2–17.1)
MCH: 27.6 pg (ref 27.0–33.0)
MCHC: 32.6 g/dL (ref 32.0–36.0)
MCV: 84.6 fL (ref 80.0–100.0)
MPV: 10.7 fL (ref 7.5–12.5)
Platelets: 268 10*3/uL (ref 140–400)
RBC: 4.1 10*6/uL — ABNORMAL LOW (ref 4.20–5.80)
RDW: 13.1 % (ref 11.0–15.0)
WBC: 5.8 10*3/uL (ref 3.8–10.8)

## 2020-11-15 LAB — IGG: IgG (Immunoglobin G), Serum: 949 mg/dL (ref 600–1540)

## 2020-11-15 LAB — ANTI-SMOOTH MUSCLE ANTIBODY, IGG: Actin (Smooth Muscle) Antibody (IGG): 21 U — ABNORMAL HIGH (ref <20)

## 2020-11-15 LAB — CK: Total CK: 43 U/L — ABNORMAL LOW (ref 44–196)

## 2020-11-21 ENCOUNTER — Telehealth (INDEPENDENT_AMBULATORY_CARE_PROVIDER_SITE_OTHER): Payer: Self-pay

## 2020-11-21 NOTE — Telephone Encounter (Signed)
Received a fax from [facility] in [place] requesting the following medication:  Contour Next Test Strips Quantity 100, Directions: Test BID They need a diagnosis code  I do not see these on his medication list? And I see he normally uses [facility].

## 2020-11-22 ENCOUNTER — Other Ambulatory Visit (INDEPENDENT_AMBULATORY_CARE_PROVIDER_SITE_OTHER): Payer: Self-pay | Admitting: Internal Medicine

## 2020-11-22 ENCOUNTER — Other Ambulatory Visit (INDEPENDENT_AMBULATORY_CARE_PROVIDER_SITE_OTHER): Payer: Self-pay | Admitting: Gastroenterology

## 2020-11-22 MED ORDER — BLOOD GLUCOSE TEST VI STRP: 100.0000 [IU] | ORAL_STRIP | Freq: Two times a day (BID) | 3 refills | Status: DC

## 2020-11-22 NOTE — Telephone Encounter (Signed)
Called patient on only number provided and could not get the number to work.  Faxed request to [facility] at [phone] and received a confirmation that it went through.

## 2020-11-22 NOTE — Progress Notes (Signed)
egd colonoscopy

## 2020-11-22 NOTE — Telephone Encounter (Signed)
I have filled out a written prescription and put it on your desk.  Please fax it to [facility].  Thanks!

## 2020-11-23 ENCOUNTER — Other Ambulatory Visit (INDEPENDENT_AMBULATORY_CARE_PROVIDER_SITE_OTHER): Payer: Self-pay

## 2020-11-23 ENCOUNTER — Other Ambulatory Visit (INDEPENDENT_AMBULATORY_CARE_PROVIDER_SITE_OTHER): Payer: Self-pay | Admitting: Internal Medicine

## 2020-11-23 ENCOUNTER — Telehealth (INDEPENDENT_AMBULATORY_CARE_PROVIDER_SITE_OTHER): Payer: Self-pay

## 2020-11-23 ENCOUNTER — Encounter (INDEPENDENT_AMBULATORY_CARE_PROVIDER_SITE_OTHER): Payer: Self-pay

## 2020-11-23 ENCOUNTER — Telehealth (INDEPENDENT_AMBULATORY_CARE_PROVIDER_SITE_OTHER): Payer: Self-pay | Admitting: Gastroenterology

## 2020-11-23 DIAGNOSIS — Z1211 Encounter for screening for malignant neoplasm of colon: Secondary | ICD-10-CM

## 2020-11-23 MED ORDER — ACCU-CHEK GUIDE VI STRP: ORAL_STRIP | 12 refills | Status: DC

## 2020-11-23 MED ORDER — NA SULFATE-K SULFATE-MG SULF 17.5-3.13-1.6 GM/177ML PO SOLN: 354.0000 mL | Freq: Once | ORAL | 0 refills | Status: AC

## 2020-11-23 MED ORDER — ACCU-CHEK GUIDE W/DEVICE KIT: 1.0000 [IU] | PACK | Freq: Two times a day (BID) | 3 refills | Status: DC

## 2020-11-23 MED ORDER — ACCU-CHEK SOFT TOUCH LANCETS MISC: 12 refills | Status: DC

## 2020-11-23 NOTE — Telephone Encounter (Signed)
Please let the patient know this is a medication prescribed by his cardiologist, he should reach their office to have it refilled.  Thanks

## 2020-11-23 NOTE — Telephone Encounter (Signed)
Received a fax from [facility] that the patients insurance does not cover Contour Next test strips.   Message states that they cover this brand and patient will need a new prescription for the following:  Accu-Chek meter Accu-Chek strips and lancets  Please send to [facility] in [place].

## 2020-11-23 NOTE — Telephone Encounter (Signed)
Patient is aware of all. 

## 2020-11-23 NOTE — Telephone Encounter (Signed)
Patients wife called stated patient spoke with Dr Jenetta Downer last night - he wanted patient to go back on rosuvastatin 40 mg - would like a 90 day supply sent to [facility] - please advise - ph# [phone]

## 2020-11-23 NOTE — Telephone Encounter (Signed)
Jacob Farmer, CMA  

## 2020-11-28 ENCOUNTER — Encounter (INDEPENDENT_AMBULATORY_CARE_PROVIDER_SITE_OTHER): Payer: Self-pay

## 2020-11-28 IMAGING — US US RENAL
1 series · 14 of 25 positions shown · non-contrast
Comparison: None.

CLINICAL DATA: 73-year-old male with a history of acute renal
failure

EXAM:
RENAL / URINARY TRACT ULTRASOUND COMPLETE

[Series 1: us renal · 14 of 38 slices shown]
[im 1/38]
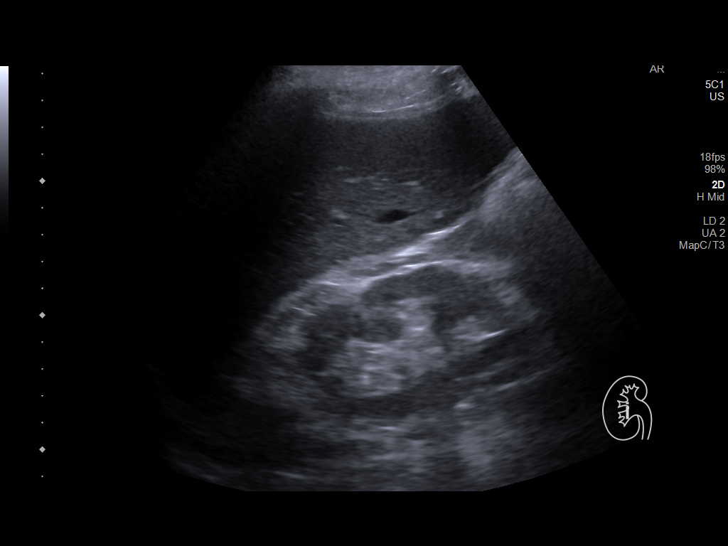
[im 4/38]
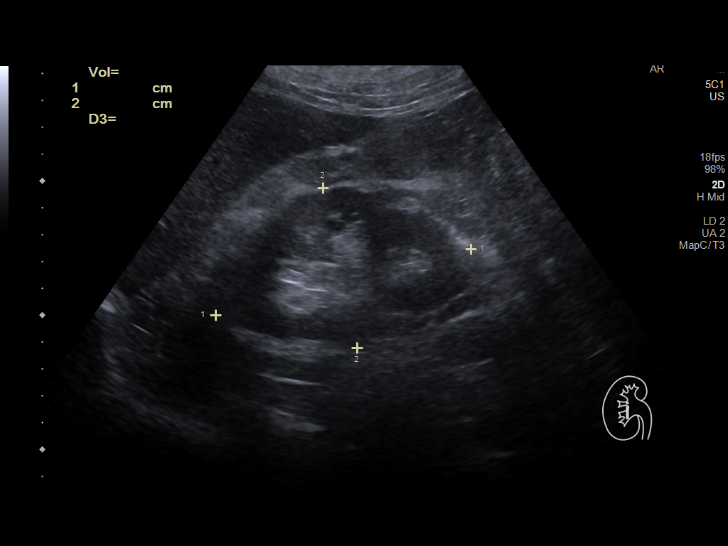
[im 7/38]
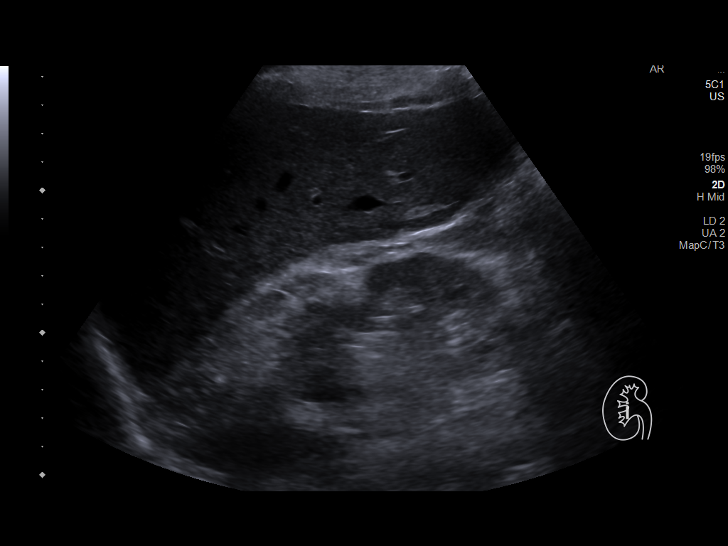
[im 10/38]
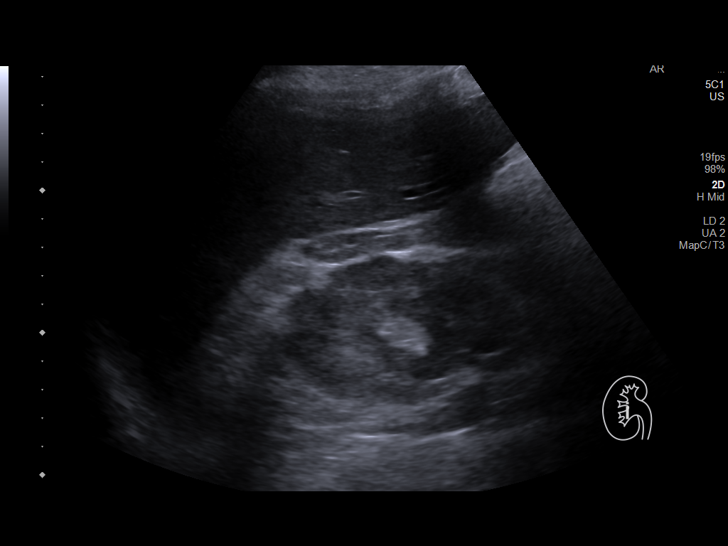
[im 13/38]
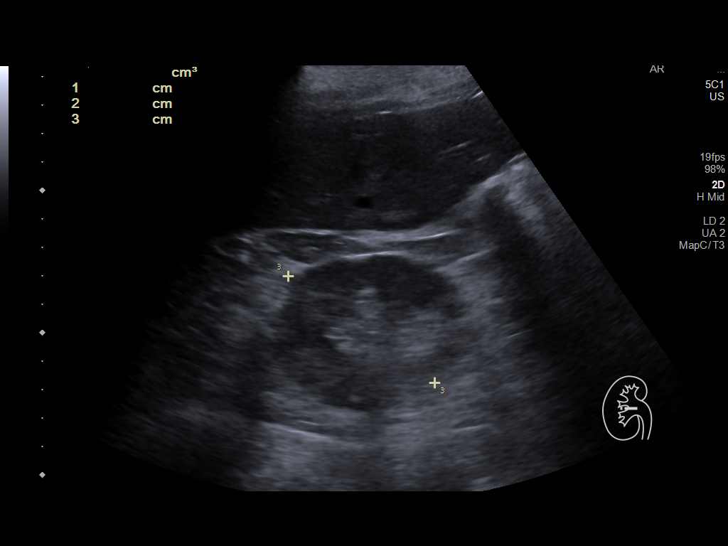
[im 14/38]
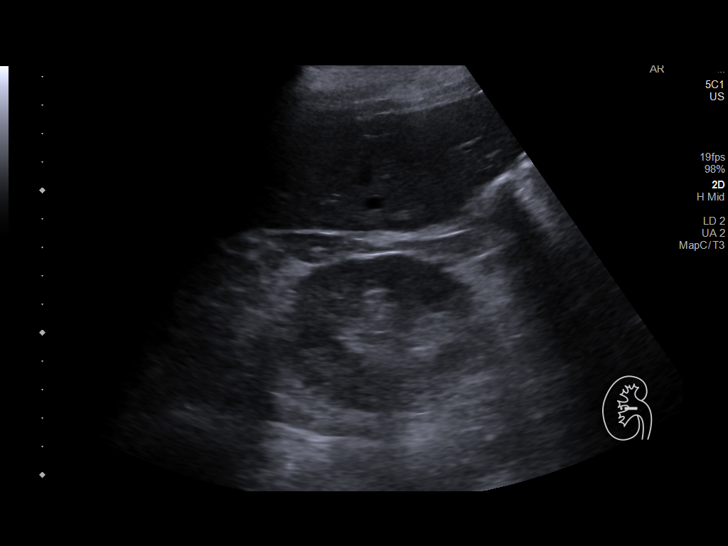
[im 17/38]
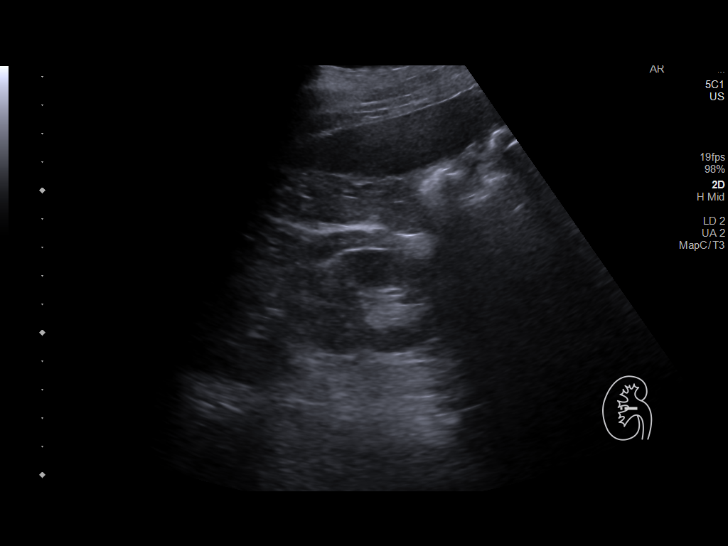
[im 21/38]
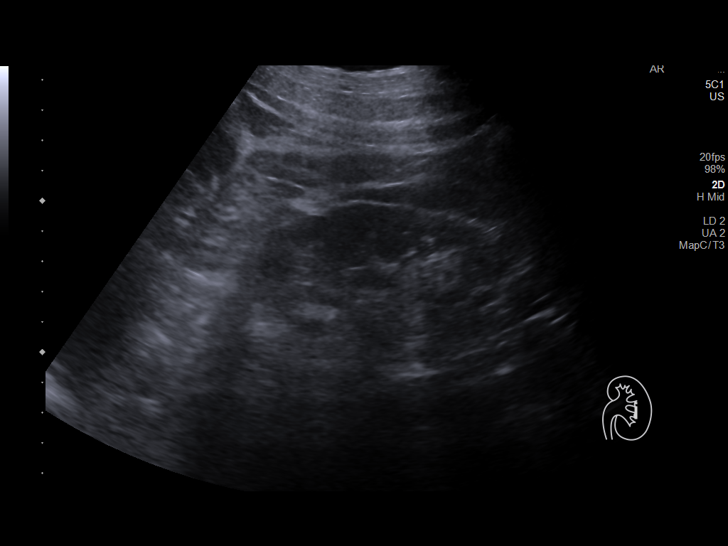
[im 24/38]
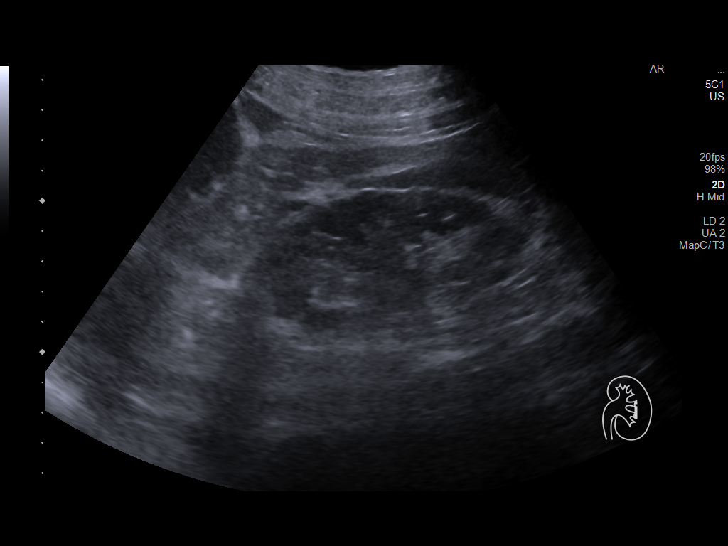
[im 25/38]
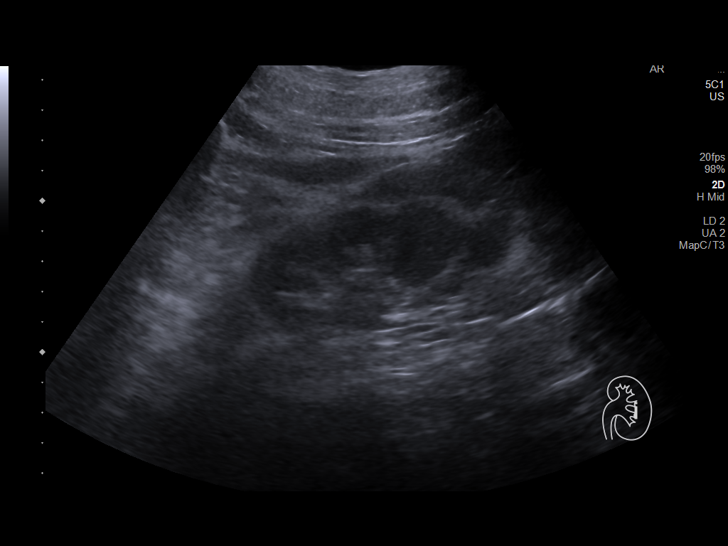
[im 28/38]
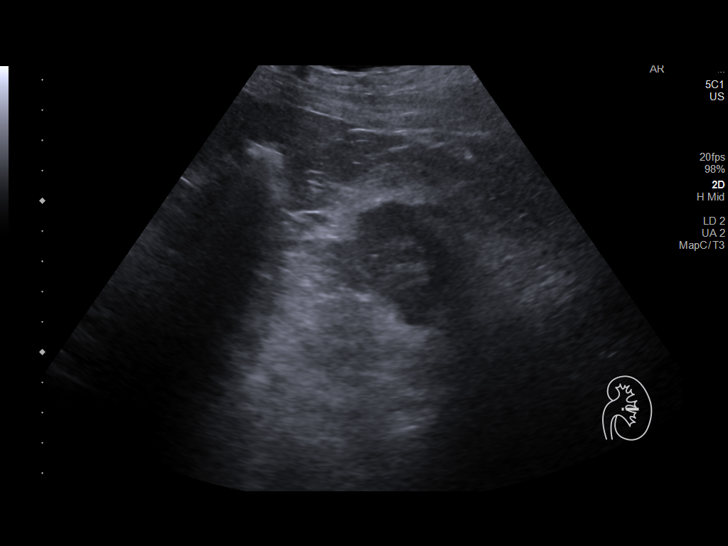
[im 31/38]
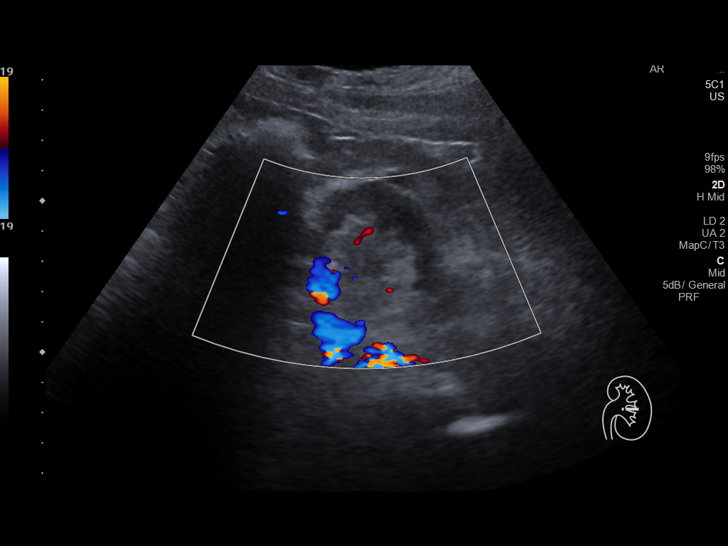
[im 34/38]
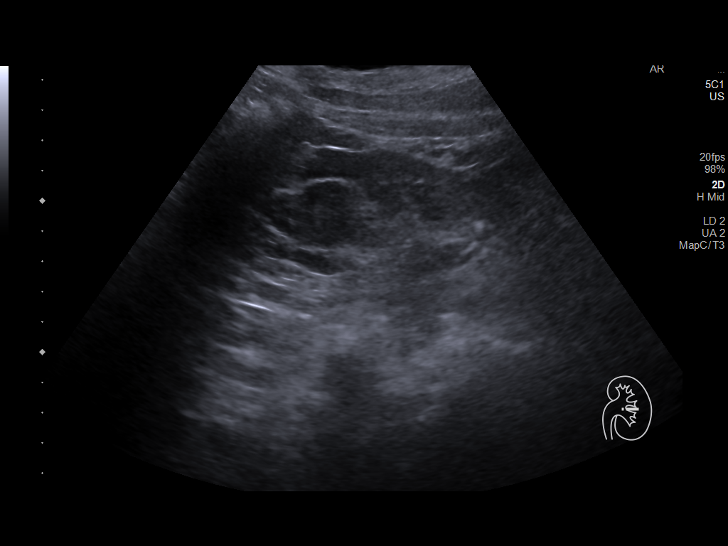
[im 38/38]
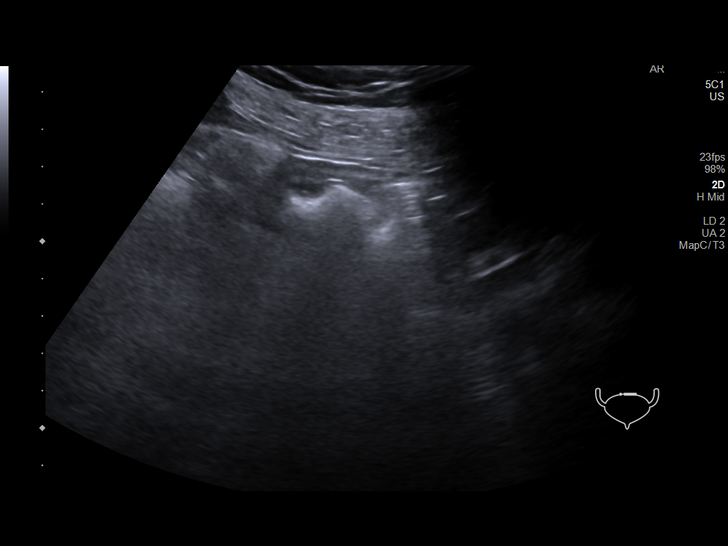

[14 of 25 positions shown; findings below may reference images not displayed]

FINDINGS: Right Kidney:

Length: 9.8 cm x 6.1 cm x 6.4 cm, 200 cc. No significant renal
cortical thinning. No hydronephrosis. No relatively increased
echogenicity. Flow confirmed in the hilum

Left Kidney:

Length: 8.6 cm x 5.1 cm x 4.3 cm, 100 cc. No significant renal
cortical thinning. No hydronephrosis. No significant increased
echogenicity flow confirmed

Bladder:

Appears normal for degree of bladder distention.
IMPRESSION: Negative for hydronephrosis.

## 2020-11-28 MED ORDER — NA SULFATE-K SULFATE-MG SULF 17.5-3.13-1.6 GM/177ML PO SOLN: 354.0000 mL | Freq: Once | ORAL | 0 refills | Status: AC

## 2020-12-07 ENCOUNTER — Ambulatory Visit: Payer: Medicare HMO | Admitting: Specialist

## 2020-12-07 ENCOUNTER — Encounter: Payer: Self-pay | Admitting: Specialist

## 2020-12-07 ENCOUNTER — Other Ambulatory Visit: Payer: Self-pay

## 2020-12-07 VITALS — BP 133/68 | HR 90 | Ht 70.0 in | Wt 175.0 lb

## 2020-12-07 DIAGNOSIS — M48062 Spinal stenosis, lumbar region with neurogenic claudication: Secondary | ICD-10-CM

## 2020-12-07 DIAGNOSIS — M549 Dorsalgia, unspecified: Secondary | ICD-10-CM

## 2020-12-07 DIAGNOSIS — M542 Cervicalgia: Secondary | ICD-10-CM

## 2020-12-07 DIAGNOSIS — M47812 Spondylosis without myelopathy or radiculopathy, cervical region: Secondary | ICD-10-CM | POA: Diagnosis not present

## 2020-12-07 NOTE — Patient Instructions (Signed)
  Plan: Avoid overhead lifting and overhead use of the arms. Do not lift greater than 5 lbs. Adjust head rest in vehicle to prevent hyperextension if rear ended. Take extra precautions to avoid falling. Avoid bending, stooping and avoid lifting weights greater than 10 lbs. Avoid prolong standing and walking. Avoid frequent bending and stooping  No lifting greater than 10 lbs. May use ice or moist heat for pain. Weight loss is of benefit. Handicap license is approved. Call us if pain recurrs and we will have Dr. Romona Curls secretary/Assistant will call to arrange for further evaluation for RFA or facet blocks.

## 2020-12-07 NOTE — Progress Notes (Signed)
Office Visit Note   Patient: Jacob Farmer           Date of Birth: 02/01/1946           MRN: [id number] Visit Date: 12/07/2020              Requested by: Doree Albee, MD [address] [place],  [place] [address] PCP: Doree Albee, MD   Assessment & Plan: Visit Diagnoses:  1. Spondylosis without myelopathy or radiculopathy, cervical region   2. Spinal stenosis of lumbar region with neurogenic claudication   3. Mid back pain   4. Cervicalgia     Plan: Avoid overhead lifting and overhead use of the arms. Do not lift greater than 5 lbs. Adjust head rest in vehicle to prevent hyperextension if rear ended. Take extra precautions to avoid falling. Avoid bending, stooping and avoid lifting weights greater than 10 lbs. Avoid prolong standing and walking. Avoid frequent bending and stooping  No lifting greater than 10 lbs. May use ice or moist heat for pain. Weight loss is of benefit. Handicap license is approved. Dr. Romona Curls secretary/Assistant will call to arrange for further evaluation for RFA or facet blocks.   Follow-Up Instructions: No follow-ups on file.   Orders:  No orders of the defined types were placed in this encounter.  No orders of the defined types were placed in this encounter.     Procedures: No procedures performed   Clinical Data: No additional findings.   Subjective: Chief Complaint  Patient presents with  . Neck - Pain    74 year old right handed male with spondylosis had injection of right C7-T1 facet with good relief. He is still able to get along  Well. Walking well. Had some LFT elevation due to NSAIDs and had loss of blood and Iron. He is to undergo a colonoscopy. He was diagnosed with an ulcer.   Review of Systems  Constitutional: Negative.   HENT: Negative.   Eyes: Negative.   Respiratory: Negative.   Gastrointestinal: Positive for anal bleeding and blood in stool.  Endocrine: Negative.   Genitourinary: Negative.    Musculoskeletal: Positive for neck pain and neck stiffness.  Skin: Negative.   Allergic/Immunologic: Negative.   Neurological: Negative.   Hematological: Negative.      Objective: Vital Signs: BP 133/68   Pulse 90   Ht 5\' 10"  (1.778 m)   Wt 175 lb (79.4 kg)   BMI 25.11 kg/m   Physical Exam Constitutional:      Appearance: He is well-developed and well-nourished.  HENT:     Head: Normocephalic and atraumatic.  Eyes:     Extraocular Movements: EOM normal.     Pupils: Pupils are equal, round, and reactive to light.  Pulmonary:     Effort: Pulmonary effort is normal.     Breath sounds: Normal breath sounds.  Abdominal:     General: Bowel sounds are normal.     Palpations: Abdomen is soft.  Musculoskeletal:     Cervical back: Normal range of motion and neck supple.  Skin:    General: Skin is warm and dry.  Neurological:     Mental Status: He is alert and oriented to person, place, and time.  Psychiatric:        Mood and Affect: Mood and affect normal.        Behavior: Behavior normal.        Thought Content: Thought content normal.  Judgment: Judgment normal.     Back Exam   Tenderness  The patient is experiencing tenderness in the cervical.  Range of Motion  Extension: abnormal  Flexion: normal  Lateral bend right: abnormal  Lateral bend left: abnormal  Rotation right: abnormal  Rotation left: abnormal   Muscle Strength  Right Quadriceps:  5/5  Left Quadriceps:  5/5  Right Hamstrings:  5/5  Left Hamstrings:  5/5   Reflexes  Patellar: 2/4 Achilles: 2/4      Specialty Comments:  No specialty comments available.  Imaging: No results found.   PMFS History: Patient Active Problem List   Diagnosis Date Noted  . Herniated cervical disc 05/26/2017    Priority: High    Class: Acute  . Elevated LFTs 11/08/2020  . History of gastric ulcer   . Itching 09/01/2019  . Abnormal weight loss 07/31/2019  . Abnormal findings on diagnostic  imaging of other abdominal regions, including retroperitoneum 07/29/2019  . Fatigue 07/29/2019  . Herniation of cervical intervertebral disc with radiculopathy 05/26/2017  . Cervical spinal stenosis 05/26/2017  . Lumbar disc disease 02/04/2017  . Primary osteoarthritis of both hands 02/04/2017  . History of gout 02/04/2017  . DDD (degenerative disc disease), lumbar 02/04/2017  . Pain in joint of left knee 02/04/2017  . High risk medication use 01/28/2017  . History of prostate cancer 01/28/2017  . History of renal insufficiency syndrome 01/28/2017  . Malignant neoplasm of prostate ([place]) 11/17/2015  . Stage 3a chronic kidney disease ([place]) 12/27/2011  . Type 2 diabetes mellitus with hyperosmolarity without coma, without long-term current use of insulin ([place]) 12/27/2011  . Rheumatoid arthritis ([place]) 01/04/2011  . SINUS BRADYCARDIA 01/12/2010  . 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 Hyperlipidemia 01/08/2010  . Essential hypertension 01/08/2010   Past Medical History:  Diagnosis Date  . CAD (coronary artery disease)    Acute myocardial infarction treated with TPA in 1990; 1999-stents to circumflex and RCA; residual total occlusion of the left anterior descending; normal ejection fraction; stress nuclear in 2001-distal anteroseptal ischemia; normal LV function  . CKD (chronic kidney disease) stage 4, GFR 15-29 ml/min (HCC)   . First degree heart block   . History of gastric ulcer   . History of kidney stones   . History of MI (myocardial infarction)    1990-  treated w/ TPA  . Hyperlipidemia   . Hypertension   . Jaundice   . OA (osteoarthritis)   . Prediabetes   . Prostate cancer ([place])    Stage T1c , Gleason 3+4,  PSA 4.7,  vol 24cc--  scheduled for radiative seed implants  . Psoriatic arthritis ([place])   . RA (rheumatoid arthritis) (HCC)    Dr. Toni Amend  . Sinus bradycardia   . Wears dentures     Family History  Problem Relation Age of Onset  . Heart  attack Mother   . Coronary artery disease Father   . Ulcerative colitis Father   . Ulcers Father   . Breast cancer Sister   . COPD Brother   . Pancreatic cancer Brother   . Healthy Sister   . Diabetes Brother   . Cirrhosis Son   . Seizures Son     Past Surgical History:  Procedure Laterality Date  . BIOPSY  08/27/2019   Procedure: BIOPSY;  Surgeon: Rogene Houston, MD;  [facility];  Service: Endoscopy;;  gastric  . CARDIOVASCULAR STRESS TEST  2001  per Dr Lattie Haw clinic note  distal anteroseptal ischemia,  normal LVF  . COLONOSCOPY  last one 2006 (approx)  . CORONARY ANGIOPLASTY WITH STENT PLACEMENT  1999   DES x2  to CFX and RCA/  residual total occlusion LAD,  normal LVEF  . ESOPHAGOGASTRODUODENOSCOPY (EGD) WITH PROPOFOL N/A 08/27/2019   Procedure: ESOPHAGOGASTRODUODENOSCOPY (EGD) WITH PROPOFOL;  Surgeon: Rogene Houston, MD;  [facility];  Service: Endoscopy;  Laterality: N/A;  10:10  . EYE SURGERY Bilateral    cataract  . POSTERIOR CERVICAL FUSION/FORAMINOTOMY N/A 05/26/2017   Procedure: RIGHT C4-5 FORAMINOTOMY WITH EXCISION OF HERNIATED [place];  Surgeon: Jessy Oto, MD;  Location: [place];  Service: Orthopedics;  Laterality: N/A;  . RADIOACTIVE SEED IMPLANT N/A 01/11/2016   Procedure: RADIOACTIVE SEED IMPLANT/BRACHYTHERAPY IMPLANT;  Surgeon: Franchot Gallo, MD;  Location: [facility];  Service: Urology;  Laterality: N/A;   73  seeds implanted no seeds founds in bladder   Social History   Occupational History  . Occupation: Retired Clinical biochemist  Tobacco Use  . Smoking status: Former Smoker    Packs/day: 1.00    Years: 30.00    Pack years: 30.00    Types: Cigarettes    Quit date: 05/21/1989    Years since quitting: 31.5  . Smokeless tobacco: Never Used  Vaping Use  . Vaping Use: Never used  Substance and Sexual Activity  . Alcohol use: Yes    Alcohol/week: 20.0 standard drinks    Types: 10 Shots of liquor, 10 Standard drinks  or equivalent per week    Comment: 1 beer per day  . Drug use: No  . Sexual activity: Not on file

## 2020-12-12 ENCOUNTER — Other Ambulatory Visit: Payer: Self-pay | Admitting: *Deleted

## 2020-12-12 MED ORDER — DUPIXENT 300 MG/2ML ~~LOC~~ SOAJ: 1.0000 "application " | SUBCUTANEOUS | 2 refills | Status: DC

## 2020-12-13 ENCOUNTER — Telehealth: Payer: Self-pay | Admitting: *Deleted

## 2020-12-13 NOTE — Telephone Encounter (Signed)
Phone call to patient to check on his dupixent. He is still getting his medication and Dupixent my way said they should hear something on 12/21/20 about his re application for the new year. Patient will call with any problems or concerns.

## 2020-12-20 ENCOUNTER — Ambulatory Visit (INDEPENDENT_AMBULATORY_CARE_PROVIDER_SITE_OTHER): Payer: Medicare HMO | Admitting: Gastroenterology

## 2020-12-21 DIAGNOSIS — R809 Proteinuria, unspecified: Secondary | ICD-10-CM | POA: Diagnosis not present

## 2020-12-21 DIAGNOSIS — I5032 Chronic diastolic (congestive) heart failure: Secondary | ICD-10-CM | POA: Diagnosis not present

## 2020-12-21 DIAGNOSIS — E211 Secondary hyperparathyroidism, not elsewhere classified: Secondary | ICD-10-CM | POA: Diagnosis not present

## 2020-12-21 DIAGNOSIS — D631 Anemia in chronic kidney disease: Secondary | ICD-10-CM | POA: Diagnosis not present

## 2020-12-21 DIAGNOSIS — N184 Chronic kidney disease, stage 4 (severe): Secondary | ICD-10-CM | POA: Diagnosis not present

## 2020-12-21 DIAGNOSIS — N189 Chronic kidney disease, unspecified: Secondary | ICD-10-CM | POA: Diagnosis not present

## 2020-12-25 ENCOUNTER — Telehealth: Payer: Self-pay | Admitting: *Deleted

## 2020-12-25 NOTE — Telephone Encounter (Signed)
Prior Authorization done via cover my meds for patients dupixent. Waiting on determination.

## 2020-12-27 ENCOUNTER — Telehealth: Payer: Self-pay

## 2020-12-27 DIAGNOSIS — I129 Hypertensive chronic kidney disease with stage 1 through stage 4 chronic kidney disease, or unspecified chronic kidney disease: Secondary | ICD-10-CM | POA: Diagnosis not present

## 2020-12-27 DIAGNOSIS — I5032 Chronic diastolic (congestive) heart failure: Secondary | ICD-10-CM | POA: Diagnosis not present

## 2020-12-27 DIAGNOSIS — N184 Chronic kidney disease, stage 4 (severe): Secondary | ICD-10-CM | POA: Diagnosis not present

## 2020-12-27 DIAGNOSIS — E211 Secondary hyperparathyroidism, not elsewhere classified: Secondary | ICD-10-CM | POA: Diagnosis not present

## 2020-12-27 DIAGNOSIS — D631 Anemia in chronic kidney disease: Secondary | ICD-10-CM | POA: Diagnosis not present

## 2020-12-27 DIAGNOSIS — N189 Chronic kidney disease, unspecified: Secondary | ICD-10-CM | POA: Diagnosis not present

## 2020-12-27 NOTE — Telephone Encounter (Signed)
Fax received from [facility] stating the patient's Dupixent is approved until 12/15/2021.

## 2020-12-27 NOTE — Telephone Encounter (Signed)
Fax received from [facility] stating that the patient will receive Dupixent through the patient assistance program at no cost through 12/15/2021.

## 2021-01-02 NOTE — Patient Instructions (Signed)
Jacob Farmer  01/02/2021     @PREFPERIOPPHARMACY @   Your procedure is scheduled on  01/05/2021.  Report to [facility] at  [phone]  A.M.  Call this number if you have problems the morning of surgery:  [phone]   Remember:  Follow the diet and prep instructions given to you by the office.                       Take these medicines the morning of surgery with A SIP OF WATER  Allopurinol, amlodipine, arava.    Do not wear jewelry, make-up or nail polish.  Do not wear lotions, powders, or perfumes, or deodorant. Please brush your teeth.  Do not shave 48 hours prior to surgery.  Men may shave face and neck.  Do not bring valuables to the hospital.  [facility] is not responsible for any belongings or valuables.  Contacts, dentures or bridgework may not be worn into surgery.  Leave your suitcase in the car.  After surgery it may be brought to your room.  For patients admitted to the hospital, discharge time will be determined by your treatment team.  Patients discharged the day of surgery will not be allowed to drive home.   Name and phone number of your driver:   Family   Special instructions:   DO NOT smoke the morning of your procedure.  Please read over the following fact sheets that you were given. Anesthesia Post-op Instructions and Care and Recovery After Surgery       Upper Endoscopy, Adult, Care After This sheet gives you information about how to care for yourself after your procedure. Your health care provider may also give you more specific instructions. If you have problems or questions, contact your health care provider. What can I expect after the procedure? After the procedure, it is common to have:  A sore throat.  Mild stomach pain or discomfort.  Bloating.  Nausea. Follow these instructions at home:  Follow instructions from your health care provider about what to eat or drink after your procedure.  Return to your normal activities as  told by your health care provider. Ask your health care provider what activities are safe for you.  Take over-the-counter and prescription medicines only as told by your health care provider.  If you were given a sedative during the procedure, it can affect you for several hours. Do not drive or operate machinery until your health care provider says that it is safe.  Keep all follow-up visits as told by your health care provider. This is important.   Contact a health care provider if you have:  A sore throat that lasts longer than one day.  Trouble swallowing. Get help right away if:  You vomit blood or your vomit looks like coffee grounds.  You have: ? A fever. ? Bloody, black, or tarry stools. ? A severe sore throat or you cannot swallow. ? Difficulty breathing. ? Severe pain in your chest or abdomen. Summary  After the procedure, it is common to have a sore throat, mild stomach discomfort, bloating, and nausea.  If you were given a sedative during the procedure, it can affect you for several hours. Do not drive or operate machinery until your health care provider says that it is safe.  Follow instructions from your health care provider about what to eat or drink after your procedure.  Return to your normal activities  as told by your health care provider. This information is not intended to replace advice given to you by your health care provider. Make sure you discuss any questions you have with your health care provider. Document Revised: 11/30/2019 Document Reviewed: 05/04/2018 Elsevier Patient Education  2021 [place].  Colonoscopy, Adult, Care After This sheet gives you information about how to care for yourself after your procedure. Your health care provider may also give you more specific instructions. If you have problems or questions, contact your health care provider. What can I expect after the procedure? After the procedure, it is common to have:  A small  amount of blood in your stool for 24 hours after the procedure.  Some gas.  Mild cramping or bloating of your abdomen. Follow these instructions at home: Eating and drinking  Drink enough fluid to keep your urine pale yellow.  Follow instructions from your health care provider about eating or drinking restrictions.  Resume your normal diet as instructed by your health care provider. Avoid heavy or fried foods that are hard to digest.   Activity  Rest as told by your health care provider.  Avoid sitting for a long time without moving. Get up to take short walks every 1-2 hours. This is important to improve blood flow and breathing. Ask for help if you feel weak or unsteady.  Return to your normal activities as told by your health care provider. Ask your health care provider what activities are safe for you. Managing cramping and bloating  Try walking around when you have cramps or feel bloated.  Apply heat to your abdomen as told by your health care provider. Use the heat source that your health care provider recommends, such as a moist heat pack or a heating pad. ? Place a towel between your skin and the heat source. ? Leave the heat on for 20-30 minutes. ? Remove the heat if your skin turns bright red. This is especially important if you are unable to feel pain, heat, or cold. You may have a greater risk of getting burned.   General instructions  If you were given a sedative during the procedure, it can affect you for several hours. Do not drive or operate machinery until your health care provider says that it is safe.  For the first 24 hours after the procedure: ? Do not sign important documents. ? Do not drink alcohol. ? Do your regular daily activities at a slower pace than normal. ? Eat soft foods that are easy to digest.  Take over-the-counter and prescription medicines only as told by your health care provider.  Keep all follow-up visits as told by your health care  provider. This is important. Contact a health care provider if:  You have blood in your stool 2-3 days after the procedure. Get help right away if you have:  More than a small spotting of blood in your stool.  Large blood clots in your stool.  Swelling of your abdomen.  Nausea or vomiting.  A fever.  Increasing pain in your abdomen that is not relieved with medicine. Summary  After the procedure, it is common to have a small amount of blood in your stool. You may also have mild cramping and bloating of your abdomen.  If you were given a sedative during the procedure, it can affect you for several hours. Do not drive or operate machinery until your health care provider says that it is safe.  Get help right away if  you have a lot of blood in your stool, nausea or vomiting, a fever, or increased pain in your abdomen. This information is not intended to replace advice given to you by your health care provider. Make sure you discuss any questions you have with your health care provider. Document Revised: 11/26/2019 Document Reviewed: 06/28/2019 Elsevier Patient Education  2021 [place] After This sheet gives you information about how to care for yourself after your procedure. Your health care provider may also give you more specific instructions. If you have problems or questions, contact your health care provider. What can I expect after the procedure? After the procedure, it is common to have:  Tiredness.  Forgetfulness about what happened after the procedure.  Impaired judgment for important decisions.  Nausea or vomiting.  Some difficulty with balance. Follow these instructions at home: For the time period you were told by your health care provider:  Rest as needed.  Do not participate in activities where you could fall or become injured.  Do not drive or use machinery.  Do not drink alcohol.  Do not take sleeping pills or  medicines that cause drowsiness.  Do not make important decisions or sign legal documents.  Do not take care of children on your own.      Eating and drinking  Follow the diet that is recommended by your health care provider.  Drink enough fluid to keep your urine pale yellow.  If you vomit: ? Drink water, juice, or soup when you can drink without vomiting. ? Make sure you have little or no nausea before eating solid foods. General instructions  Have a responsible adult stay with you for the time you are told. It is important to have someone help care for you until you are awake and alert.  Take over-the-counter and prescription medicines only as told by your health care provider.  If you have sleep apnea, surgery and certain medicines can increase your risk for breathing problems. Follow instructions from your health care provider about wearing your sleep device: ? Anytime you are sleeping, including during daytime naps. ? While taking prescription pain medicines, sleeping medicines, or medicines that make you drowsy.  Avoid smoking.  Keep all follow-up visits as told by your health care provider. This is important. Contact a health care provider if:  You keep feeling nauseous or you keep vomiting.  You feel light-headed.  You are still sleepy or having trouble with balance after 24 hours.  You develop a rash.  You have a fever.  You have redness or swelling around the IV site. Get help right away if:  You have trouble breathing.  You have new-onset confusion at home. Summary  For several hours after your procedure, you may feel tired. You may also be forgetful and have poor judgment.  Have a responsible adult stay with you for the time you are told. It is important to have someone help care for you until you are awake and alert.  Rest as told. Do not drive or operate machinery. Do not drink alcohol or take sleeping pills.  Get help right away if you have  trouble breathing, or if you suddenly become confused. This information is not intended to replace advice given to you by your health care provider. Make sure you discuss any questions you have with your health care provider. Document Revised: 08/17/2020 Document Reviewed: 11/04/2019 Elsevier Patient Education  2021 [employer].

## 2021-01-02 NOTE — Progress Notes (Signed)
Office Visit Note  Patient: Jacob Farmer             Date of Birth: Jul 04, 1946           MRN: [id number]             PCP: Doree Albee, MD Referring: Doree Albee, MD Visit Date: 01/15/2021 Occupation: @GUAROCC @  Subjective:  Medication management.   History of Present Illness: Jacob Farmer is a 75 y.o. male with history of rheumatoid arthritis, osteoarthritis, degenerative disc disease and gout.  He denies having any flares of rheumatoid arthritis or gout.  He has been taking his medications on a regular basis.  He was evaluated by his gastroenterologist for elevated LFTs.  Who felt that it was due to viral illness.  His LFTs normalized quickly.  The last LFTs we have from November 01, 2025 shows ALT of 54.  He has also been followed by nephrologist and the proteinuria has improved.  His GFR declined some at the last visit with the nephrologist.  Activities of Daily Living:  Patient reports morning stiffness for 0  minutes.   Patient Denies nocturnal pain.  Difficulty dressing/grooming: Denies Difficulty climbing stairs: Denies Difficulty getting out of chair: Denies Difficulty using hands for taps, buttons, cutlery, and/or writing: Denies  Review of Systems  Constitutional: Negative for fatigue.  HENT: Negative for mouth sores, mouth dryness and nose dryness.   Eyes: Negative for pain, itching and dryness.  Respiratory: Negative for shortness of breath and difficulty breathing.   Cardiovascular: Negative for chest pain and palpitations.  Gastrointestinal: Negative for blood in stool, constipation and diarrhea.  Endocrine: Negative for increased urination.  Genitourinary: Negative for difficulty urinating.  Musculoskeletal: Negative for arthralgias, joint pain, joint swelling, myalgias, morning stiffness, muscle tenderness and myalgias.  Skin: Negative for color change, rash and redness.  Allergic/Immunologic: Negative for susceptible to infections.  Neurological:  Negative for dizziness, numbness, headaches, memory loss and weakness.  Hematological: Negative for bruising/bleeding tendency.  Psychiatric/Behavioral: Negative for confusion.    PMFS History:  Patient Active Problem List   Diagnosis Date Noted  . Elevated LFTs 11/08/2020  . History of gastric ulcer   . Itching 09/01/2019  . Abnormal weight loss 07/31/2019  . Abnormal findings on diagnostic imaging of other abdominal regions, including retroperitoneum 07/29/2019  . Fatigue 07/29/2019  . Herniated cervical disc 05/26/2017    Class: Acute  . Herniation of cervical intervertebral disc with radiculopathy 05/26/2017  . Cervical spinal stenosis 05/26/2017  . Lumbar disc disease 02/04/2017  . Primary osteoarthritis of both hands 02/04/2017  . History of gout 02/04/2017  . DDD (degenerative disc disease), lumbar 02/04/2017  . Pain in joint of left knee 02/04/2017  . High risk medication use 01/28/2017  . History of prostate cancer 01/28/2017  . History of renal insufficiency syndrome 01/28/2017  . Malignant neoplasm of prostate ([place]) 11/17/2015  . Stage 3a chronic kidney disease ([place]) 12/27/2011  . Type 2 diabetes mellitus with hyperosmolarity without coma, without long-term current use of insulin ([place]) 12/27/2011  . Rheumatoid arthritis ([place]) 01/04/2011  . SINUS BRADYCARDIA 01/12/2010  . 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 Hyperlipidemia 01/08/2010  . Essential hypertension 01/08/2010    Past Medical History:  Diagnosis Date  . CAD (coronary artery disease)    Acute myocardial infarction treated with TPA in 1990; 1999-stents to circumflex and RCA; residual total occlusion of the left anterior descending; normal ejection fraction; stress nuclear in 2001-distal anteroseptal ischemia;  normal LV function  . CKD (chronic kidney disease) stage 4, GFR 15-29 ml/min (HCC)   . First degree heart block   . History of gastric ulcer   . History of kidney stones   .  History of MI (myocardial infarction)    1990-  treated w/ TPA  . Hyperlipidemia   . Hypertension   . Jaundice   . OA (osteoarthritis)   . Prediabetes   . Prostate cancer ([place])    Stage T1c , Gleason 3+4,  PSA 4.7,  vol 24cc--  scheduled for radiative seed implants  . Psoriatic arthritis ([place])   . RA (rheumatoid arthritis) (HCC)    Dr. Toni Amend  . Sinus bradycardia   . Wears dentures     Family History  Problem Relation Age of Onset  . Heart attack Mother   . Coronary artery disease Father   . Ulcerative colitis Father   . Ulcers Father   . Breast cancer Sister   . COPD Brother   . Pancreatic cancer Brother   . Healthy Sister   . Diabetes Brother   . Cirrhosis Son   . Seizures Son    Past Surgical History:  Procedure Laterality Date  . BIOPSY  08/27/2019   Procedure: BIOPSY;  Surgeon: Rogene Houston, MD;  [facility];  Service: Endoscopy;;  gastric  . CARDIOVASCULAR STRESS TEST  2001  per Dr Lattie Haw clinic note   distal anteroseptal ischemia,  normal LVF  . COLONOSCOPY  last one 2006 (approx)  . CORONARY ANGIOPLASTY WITH STENT PLACEMENT  1999   DES x2  to CFX and RCA/  residual total occlusion LAD,  normal LVEF  . ESOPHAGOGASTRODUODENOSCOPY (EGD) WITH PROPOFOL N/A 08/27/2019   Procedure: ESOPHAGOGASTRODUODENOSCOPY (EGD) WITH PROPOFOL;  Surgeon: Rogene Houston, MD;  [facility];  Service: Endoscopy;  Laterality: N/A;  10:10  . EYE SURGERY Bilateral    cataract  . POSTERIOR CERVICAL FUSION/FORAMINOTOMY N/A 05/26/2017   Procedure: RIGHT C4-5 FORAMINOTOMY WITH EXCISION OF HERNIATED [place];  Surgeon: Jessy Oto, MD;  Location: [facility];  Service: Orthopedics;  Laterality: N/A;  . RADIOACTIVE SEED IMPLANT N/A 01/11/2016   Procedure: RADIOACTIVE SEED IMPLANT/BRACHYTHERAPY IMPLANT;  Surgeon: Franchot Gallo, MD;  Location: [facility];  Service: Urology;  Laterality: N/A;   73  seeds implanted no seeds founds in bladder   Social  History   Social History Narrative   Married for 24 years.Retired Clinical biochemist.   Immunization History  Administered Date(s) Administered  . Influenza, High Dose Seasonal PF 09/12/2017, 08/28/2019, 08/28/2019  . Influenza-Unspecified 08/08/2020  . PFIZER(Purple Top)SARS-COV-2 Vaccination 01/22/2020, 02/12/2020, 08/15/2020  . Pneumococcal Polysaccharide-23 08/16/2009  . Zoster Recombinat (Shingrix) 07/29/2019     Objective: Vital Signs: BP (!) 168/89 (BP Location: Left Arm, Patient Position: Sitting, Cuff Size: Normal)   Pulse 71   Resp 17   Ht 5\' 10"  (1.778 m)   Wt 174 lb 9.6 oz (79.2 kg)   BMI 25.05 kg/m    Physical Exam Vitals and nursing note reviewed.  Constitutional:      Appearance: He is well-developed and well-nourished.  HENT:     Head: Normocephalic and atraumatic.  Eyes:     Extraocular Movements: EOM normal.     Conjunctiva/sclera: Conjunctivae normal.     Pupils: Pupils are equal, round, and reactive to light.  Cardiovascular:     Rate and Rhythm: Normal rate and regular rhythm.     Heart sounds: Normal heart sounds.  Pulmonary:     Effort: Pulmonary effort is normal.     Breath sounds: Normal breath sounds.  Abdominal:     General: Bowel sounds are normal.     Palpations: Abdomen is soft.  Musculoskeletal:     Cervical back: Normal range of motion and neck supple.  Skin:    General: Skin is warm and dry.     Capillary Refill: Capillary refill takes less than 2 seconds.  Neurological:     Mental Status: He is alert and oriented to person, place, and time.  Psychiatric:        Mood and Affect: Mood and affect normal.        Behavior: Behavior normal.      Musculoskeletal Exam: C-spine was in good range of motion.  Shoulder joints, elbow joints, wrist joints, MCPs PIPs and DIPs with good range of motion with no synovitis.  Hip joints, knee joints, ankles, MTPs and PIPs with good range of motion with no synovitis.  CDAI Exam: CDAI Score: 0  Patient  Global: 0 mm; Provider Global: 0 mm Swollen: 0 ; Tender: 0  Joint Exam 01/15/2021   No joint exam has been documented for this visit   There is currently no information documented on the homunculus. Go to the Rheumatology activity and complete the homunculus joint exam.  Investigation: No additional findings.  Imaging: No results found.  Recent Labs: Lab Results  Component Value Date   WBC 5.8 11/13/2020   HGB 11.3 (L) 11/13/2020   PLT 268 11/13/2020   NA 140 11/13/2020   K 4.9 11/13/2020   CL 104 11/13/2020   CO2 26 11/13/2020   GLUCOSE 81 11/13/2020   BUN 28 (H) 11/13/2020   CREATININE 2.15 (H) 11/13/2020   BILITOT 0.3 11/13/2020   ALKPHOS 254 (H) 11/06/2020   AST 27 11/13/2020   ALT 54 (H) 11/13/2020   PROT 6.6 11/13/2020   ALBUMIN 2.9 (L) 11/06/2020   CALCIUM 9.1 11/13/2020   GFRAA 36 (L) 11/01/2020   December 21, 2020 CBC WBC 5.3, hemoglobin 12.1, platelets 190, BMP with GFR showed creatinine 2.42 GFR 25 protein creatinine ratio elevated at 1.275  Speciality Comments: No specialty comments available.  Procedures:  No procedures performed Allergies: Patient has no known allergies.   Assessment / Plan:     Visit Diagnoses: Seronegative rheumatoid arthritis ([place]) -he has no synovitis on my examination today.  He has been tolerating the medication well.  He is on Arava 10 mg p.o. daily.  A prescription refill was given today plan: leflunomide (ARAVA) 10 MG tablet  High risk medication use - Arava 10 mg 1 tablet by mouth daily.  He had elevated LFTs in November which normalized by itself.  He was evaluated by his GI who felt that the elevation of LFTs were related to a viral illness.  He had recent labs with his nephrologist.  Which included CBC with differential and BMP with GFR.  I will check LFTs today.  His next labs will be in April and then every 3 months.- Plan: AST, ALT  Idiopathic chronic gout of multiple sites without tophus-he denies having any gout  flares.  Primary osteoarthritis of both hands-his DIP and PIP thickening.  Joint protection was discussed.  DDD (degenerative disc disease), lumbar - followed by Dr. Louanne Skye.Marland Kitchen  He states that the epidural injection helped his lower back pain.  History of gastroesophageal reflux (GERD)  History of coronary artery disease  History of renal insufficiency syndrome -  Followed by Dr. Theador Hawthorne.  I reviewed his labs done at the nephrologist office.  His creatinine has increased and proteinuria has improved.  History of prostate cancer  History of hypercholesterolemia  History of hypertension-his blood pressure is elevated today.  Have advised him to monitor blood pressure closely and follow-up with his PCP.  Osteoporosis screening -he is over age 60.  We will schedule DEXA scan.  Plan: DG BONE DENSITY (DXA)  Educate about COVID-19 virus infection-patient is fully vaccinated and had the third dose of the vaccine.  I have advised him to get a booster now.  Use of mask, social distancing and hand hygiene was emphasized.  Orders: Orders Placed This Encounter  Procedures  . DG BONE DENSITY (DXA)  . AST  . ALT   Meds ordered this encounter  Medications  . allopurinol (ZYLOPRIM) 100 MG tablet    Sig: Take 0.5 tablets (50 mg total) by mouth every other day.    Dispense:  23 tablet    Refill:  0  . leflunomide (ARAVA) 10 MG tablet    Sig: Take 1 tablet (10 mg total) by mouth daily.    Dispense:  90 tablet    Refill:  0     Follow-Up Instructions: Return in about 5 months (around 06/14/2021) for Rheumatoid arthritis.   Bo Merino, MD  Note - This record has been created using Editor, commissioning.  Chart creation errors have been sought, but may not always  have been located. Such creation errors do not reflect on  the standard of medical care.

## 2021-01-03 ENCOUNTER — Encounter (HOSPITAL_COMMUNITY): Payer: Self-pay

## 2021-01-03 ENCOUNTER — Other Ambulatory Visit: Payer: Self-pay

## 2021-01-03 ENCOUNTER — Other Ambulatory Visit (HOSPITAL_COMMUNITY)
Admission: RE | Admit: 2021-01-03 | Discharge: 2021-01-03 | Disposition: A | Discharge status: Home / Self Care | Payer: Medicare HMO | Source: Ambulatory Visit | Attending: Gastroenterology | Admitting: Gastroenterology

## 2021-01-03 ENCOUNTER — Encounter (HOSPITAL_COMMUNITY)
Admission: RE | Admit: 2021-01-03 | Discharge: 2021-01-03 | Disposition: A | Discharge status: Home / Self Care | Payer: Medicare HMO | Source: Ambulatory Visit | Attending: Gastroenterology | Admitting: Gastroenterology

## 2021-01-03 DIAGNOSIS — Z20822 Contact with and (suspected) exposure to covid-19: Secondary | ICD-10-CM | POA: Insufficient documentation

## 2021-01-03 DIAGNOSIS — Z01812 Encounter for preprocedural laboratory examination: Secondary | ICD-10-CM | POA: Diagnosis not present

## 2021-01-03 LAB — SARS CORONAVIRUS 2 (TAT 6-24 HRS): SARS Coronavirus 2: NEGATIVE

## 2021-01-04 ENCOUNTER — Encounter (HOSPITAL_COMMUNITY): Payer: Self-pay | Admitting: Anesthesiology

## 2021-01-05 ENCOUNTER — Encounter (HOSPITAL_COMMUNITY): Admission: RE | Payer: Self-pay | Source: Home / Self Care

## 2021-01-05 ENCOUNTER — Ambulatory Visit (HOSPITAL_COMMUNITY): Admission: RE | Admit: 2021-01-05 | Payer: Medicare HMO | Source: Home / Self Care | Admitting: Gastroenterology

## 2021-01-05 SURGERY — COLONOSCOPY WITH PROPOFOL
Anesthesia: Monitor Anesthesia Care

## 2021-01-11 ENCOUNTER — Other Ambulatory Visit (INDEPENDENT_AMBULATORY_CARE_PROVIDER_SITE_OTHER): Payer: Self-pay | Admitting: Internal Medicine

## 2021-01-11 ENCOUNTER — Telehealth (INDEPENDENT_AMBULATORY_CARE_PROVIDER_SITE_OTHER): Payer: Self-pay

## 2021-01-11 ENCOUNTER — Other Ambulatory Visit: Payer: Self-pay | Admitting: Physician Assistant

## 2021-01-11 MED ORDER — ACCU-CHEK GUIDE VI STRP: ORAL_STRIP | 12 refills | Status: DC

## 2021-01-11 NOTE — Telephone Encounter (Signed)
Last Visit: 08/14/2020 Next Visit: 01/15/2021 Labs: 11/02/2020, RBC 3.78, Hemoglobin 10.5, HCT 33.9, Lymphs Abs 0.5, Protein 100, Glucose 137, BUN 28, Creatinine .2.17, Calcium 8.4, Total Protein 6.4, Albumin 2.6, AST 528, ALT 801, Alkaline Phosphatase 353, GFR 31  Current Dose per office note 08/14/2020, allopurinol 100 mg half tablet by mouth daily,  DX: Seronegative rheumatoid arthritis   Okay to refill Allopurinol?

## 2021-01-11 NOTE — Telephone Encounter (Signed)
Received a fax from [facility] requesting a 90 day refill for the following:  glucose blood (ACCU-CHEK GUIDE) test strip  Last filled 11/23/2020, # 100 with 12 refills I do not see [place] on the pharmacy list anymore  Last OV 11/13/2020

## 2021-01-15 ENCOUNTER — Encounter: Payer: Self-pay | Admitting: Rheumatology

## 2021-01-15 ENCOUNTER — Ambulatory Visit: Payer: Medicare HMO | Admitting: Rheumatology

## 2021-01-15 ENCOUNTER — Other Ambulatory Visit: Payer: Self-pay

## 2021-01-15 VITALS — BP 168/89 | HR 71 | Resp 17 | Ht 70.0 in | Wt 174.6 lb

## 2021-01-15 DIAGNOSIS — Z7189 Other specified counseling: Secondary | ICD-10-CM

## 2021-01-15 DIAGNOSIS — G8929 Other chronic pain: Secondary | ICD-10-CM

## 2021-01-15 DIAGNOSIS — Z8679 Personal history of other diseases of the circulatory system: Secondary | ICD-10-CM | POA: Diagnosis not present

## 2021-01-15 DIAGNOSIS — M5136 Other intervertebral disc degeneration, lumbar region: Secondary | ICD-10-CM | POA: Diagnosis not present

## 2021-01-15 DIAGNOSIS — Z8546 Personal history of malignant neoplasm of prostate: Secondary | ICD-10-CM | POA: Diagnosis not present

## 2021-01-15 DIAGNOSIS — M0600 Rheumatoid arthritis without rheumatoid factor, unspecified site: Secondary | ICD-10-CM

## 2021-01-15 DIAGNOSIS — Z8719 Personal history of other diseases of the digestive system: Secondary | ICD-10-CM | POA: Diagnosis not present

## 2021-01-15 DIAGNOSIS — Z8639 Personal history of other endocrine, nutritional and metabolic disease: Secondary | ICD-10-CM

## 2021-01-15 DIAGNOSIS — M1A09X0 Idiopathic chronic gout, multiple sites, without tophus (tophi): Secondary | ICD-10-CM

## 2021-01-15 DIAGNOSIS — M19042 Primary osteoarthritis, left hand: Secondary | ICD-10-CM

## 2021-01-15 DIAGNOSIS — Z87448 Personal history of other diseases of urinary system: Secondary | ICD-10-CM | POA: Diagnosis not present

## 2021-01-15 DIAGNOSIS — Z13820 Encounter for screening for osteoporosis: Secondary | ICD-10-CM

## 2021-01-15 DIAGNOSIS — M19041 Primary osteoarthritis, right hand: Secondary | ICD-10-CM | POA: Diagnosis not present

## 2021-01-15 DIAGNOSIS — Z79899 Other long term (current) drug therapy: Secondary | ICD-10-CM

## 2021-01-15 MED ORDER — LEFLUNOMIDE 10 MG PO TABS: 10.0000 mg | ORAL_TABLET | Freq: Every day | ORAL | 0 refills | Status: DC

## 2021-01-15 MED ORDER — ALLOPURINOL 100 MG PO TABS: 50.0000 mg | ORAL_TABLET | ORAL | 0 refills | Status: DC

## 2021-01-15 NOTE — Patient Instructions (Addendum)
Standing Labs We placed an order today for your standing lab work.   Please have your standing labs drawn in April and every 3 months (CBC with differential and CMP with GFR)  If possible, please have your labs drawn 2 weeks prior to your appointment so that the provider can discuss your results at your appointment.  We have open lab daily Monday through Thursday from 8:30-12:30 PM and 1:30-4:30 PM and Friday from 8:30-12:30 PM and 1:30-4:00 PM at the office of Dr. Bo Merino, [facility].   Please be advised, all patients with office appointments requiring lab work will take precedents over walk-in lab work.  If possible, please come for your lab work on Monday and Friday afternoons, as you may experience shorter wait times. The office is located at [address], [place], [place], [place] [address] No appointment is necessary.   Labs are drawn by [facility]. Please bring your co-pay at the time of your lab draw.  You may receive a bill from [facility] for your lab work.  If you wish to have your labs drawn at another location, please call the office 24 hours in advance to send orders.  If you have any questions regarding directions or hours of operation,  please call [phone].   As a reminder, please drink plenty of water prior to coming for your lab work. Thanks!   COVID-19 vaccine recommendations:   COVID-19 vaccine is recommended for everyone (unless you are allergic to a vaccine component), even if you are on a medication that suppresses your immune system.   Do not take Tylenol or any anti-inflammatory medications (NSAIDs) 24 hours prior to the COVID-19 vaccination.   There is no direct evidence about the efficacy of the COVID-19 vaccine in individuals who are on medications that suppress the immune system.   The current recommendations are for the immunocompromised person to have first, second and third dose of Covid vaccine 1 month apart and then a booster  (  fourth dose) 5 to 6 months later.  Even if you are fully vaccinated, and you are on any medications that suppress your immune system, please continue to wear a mask, maintain at least six feet social distance and practice hand hygiene.   If you develop a COVID-19 infection, please contact your PCP or our office to determine if you need monoclonal antibody infusion.  The booster vaccine is now available for immunocompromised patients.   Please see the following web sites for updated information.   [email or web address]

## 2021-01-16 LAB — ALT: ALT: 26 U/L (ref 9–46)

## 2021-01-16 LAB — AST: AST: 24 U/L (ref 10–35)

## 2021-01-25 ENCOUNTER — Ambulatory Visit (INDEPENDENT_AMBULATORY_CARE_PROVIDER_SITE_OTHER): Payer: Medicare HMO | Admitting: Gastroenterology

## 2021-02-08 ENCOUNTER — Ambulatory Visit (HOSPITAL_COMMUNITY)
Admission: RE | Admit: 2021-02-08 | Discharge: 2021-02-08 | Disposition: A | Discharge status: Home / Self Care | Payer: Medicare HMO | Source: Ambulatory Visit | Attending: Rheumatology | Admitting: Rheumatology

## 2021-02-08 ENCOUNTER — Other Ambulatory Visit: Payer: Self-pay

## 2021-02-08 DIAGNOSIS — Z8546 Personal history of malignant neoplasm of prostate: Secondary | ICD-10-CM | POA: Insufficient documentation

## 2021-02-08 DIAGNOSIS — C61 Malignant neoplasm of prostate: Secondary | ICD-10-CM | POA: Diagnosis not present

## 2021-02-08 DIAGNOSIS — M069 Rheumatoid arthritis, unspecified: Secondary | ICD-10-CM | POA: Diagnosis not present

## 2021-02-08 DIAGNOSIS — E58 Dietary calcium deficiency: Secondary | ICD-10-CM | POA: Insufficient documentation

## 2021-02-08 DIAGNOSIS — Z13820 Encounter for screening for osteoporosis: Secondary | ICD-10-CM | POA: Diagnosis not present

## 2021-02-08 NOTE — Progress Notes (Signed)
DXA scan is normal.  Please notify patient.

## 2021-02-12 ENCOUNTER — Other Ambulatory Visit: Payer: Self-pay

## 2021-02-12 DIAGNOSIS — L209 Atopic dermatitis, unspecified: Secondary | ICD-10-CM

## 2021-02-12 MED ORDER — DUPIXENT 300 MG/2ML ~~LOC~~ SOAJ: 1.0000 "application " | SUBCUTANEOUS | 8 refills | Status: DC

## 2021-02-26 DIAGNOSIS — E211 Secondary hyperparathyroidism, not elsewhere classified: Secondary | ICD-10-CM | POA: Diagnosis not present

## 2021-02-26 DIAGNOSIS — N184 Chronic kidney disease, stage 4 (severe): Secondary | ICD-10-CM | POA: Diagnosis not present

## 2021-02-26 DIAGNOSIS — I5032 Chronic diastolic (congestive) heart failure: Secondary | ICD-10-CM | POA: Diagnosis not present

## 2021-02-26 DIAGNOSIS — D631 Anemia in chronic kidney disease: Secondary | ICD-10-CM | POA: Diagnosis not present

## 2021-02-26 DIAGNOSIS — I129 Hypertensive chronic kidney disease with stage 1 through stage 4 chronic kidney disease, or unspecified chronic kidney disease: Secondary | ICD-10-CM | POA: Diagnosis not present

## 2021-02-26 DIAGNOSIS — N189 Chronic kidney disease, unspecified: Secondary | ICD-10-CM | POA: Diagnosis not present

## 2021-03-02 DIAGNOSIS — N189 Chronic kidney disease, unspecified: Secondary | ICD-10-CM | POA: Diagnosis not present

## 2021-03-02 DIAGNOSIS — I129 Hypertensive chronic kidney disease with stage 1 through stage 4 chronic kidney disease, or unspecified chronic kidney disease: Secondary | ICD-10-CM | POA: Diagnosis not present

## 2021-03-02 DIAGNOSIS — E211 Secondary hyperparathyroidism, not elsewhere classified: Secondary | ICD-10-CM | POA: Diagnosis not present

## 2021-03-02 DIAGNOSIS — R809 Proteinuria, unspecified: Secondary | ICD-10-CM | POA: Diagnosis not present

## 2021-03-02 DIAGNOSIS — D631 Anemia in chronic kidney disease: Secondary | ICD-10-CM | POA: Diagnosis not present

## 2021-03-02 DIAGNOSIS — I5032 Chronic diastolic (congestive) heart failure: Secondary | ICD-10-CM | POA: Diagnosis not present

## 2021-03-02 DIAGNOSIS — N184 Chronic kidney disease, stage 4 (severe): Secondary | ICD-10-CM | POA: Diagnosis not present

## 2021-03-02 DIAGNOSIS — R898 Other abnormal findings in specimens from other organs, systems and tissues: Secondary | ICD-10-CM | POA: Diagnosis not present

## 2021-03-08 ENCOUNTER — Ambulatory Visit: Payer: Medicare HMO | Admitting: Specialist

## 2021-03-14 ENCOUNTER — Ambulatory Visit (INDEPENDENT_AMBULATORY_CARE_PROVIDER_SITE_OTHER): Payer: Medicare HMO | Admitting: Nurse Practitioner

## 2021-03-14 ENCOUNTER — Other Ambulatory Visit: Payer: Self-pay

## 2021-03-14 ENCOUNTER — Encounter (INDEPENDENT_AMBULATORY_CARE_PROVIDER_SITE_OTHER): Payer: Self-pay | Admitting: Nurse Practitioner

## 2021-03-14 VITALS — BP 138/76 | HR 77 | Temp 97.7°F | Ht 70.0 in | Wt 179.0 lb

## 2021-03-14 DIAGNOSIS — N1832 Chronic kidney disease, stage 3b: Secondary | ICD-10-CM | POA: Diagnosis not present

## 2021-03-14 DIAGNOSIS — I10 Essential (primary) hypertension: Secondary | ICD-10-CM

## 2021-03-14 DIAGNOSIS — E119 Type 2 diabetes mellitus without complications: Secondary | ICD-10-CM

## 2021-03-14 DIAGNOSIS — E1100 Type 2 diabetes mellitus with hyperosmolarity without nonketotic hyperglycemic-hyperosmolar coma (NKHHC): Secondary | ICD-10-CM | POA: Diagnosis not present

## 2021-03-14 DIAGNOSIS — E1122 Type 2 diabetes mellitus with diabetic chronic kidney disease: Secondary | ICD-10-CM | POA: Diagnosis not present

## 2021-03-14 DIAGNOSIS — E782 Mixed hyperlipidemia: Secondary | ICD-10-CM

## 2021-03-14 MED ORDER — HYDRALAZINE HCL 25 MG PO TABS: 50.0000 mg | ORAL_TABLET | Freq: Two times a day (BID) | ORAL | 1 refills | Status: DC

## 2021-03-14 NOTE — Patient Instructions (Signed)
Call Dr. Theador Hawthorne before your next lab appointment and ask if he will check your vitamin D level when he does blood work.

## 2021-03-14 NOTE — Progress Notes (Signed)
Subjective:  Patient ID: Jacob Farmer, male    DOB: 1946/10/30  Age: 75 y.o. MRN: [id number]  CC:  Chief Complaint  Patient presents with  . Follow-up    Doing okay, no concerns  . Hypertension  . Hyperlipidemia  . Diabetes  . Chronic Kidney Disease      HPI  This patient arrives today for the above.  Hypertension: He continues on amlodipine, hydralazine, and losartan.  Tolerating medications well.  He tells me before he was taking hydralazine 25 mg twice a day as opposed to 50 mg twice a day.  He has made the adjustment to his medications and was not taking 50 mg twice a day.  Hyperlipidemia: He continues on rosuvastatin as well as low-dose aspirin.  Last lipid panel was collected in May 2021 and showed LDL 83.  Diabetes: He has a history of type 2 diabetes.  He is no longer on any medications to control this and last A1c was 6.3.  Chronic kidney disease: He does see nephrology and is scheduled to see his nephrologist again in approximately 1 month.  At his last appointment blood work was checked and the nephrologist recommended the patient consider stopping his vitamin D3 supplement.  The patient would like to discuss possibly stopping it today.  Past Medical History:  Diagnosis Date  . CAD (coronary artery disease)    Acute myocardial infarction treated with TPA in 1990; 1999-stents to circumflex and RCA; residual total occlusion of the left anterior descending; normal ejection fraction; stress nuclear in 2001-distal anteroseptal ischemia; normal LV function  . CKD (chronic kidney disease) stage 4, GFR 15-29 ml/min (HCC)   . First degree heart block   . History of gastric ulcer   . History of kidney stones   . History of MI (myocardial infarction)    1990-  treated w/ TPA  . Hyperlipidemia   . Hypertension   . Jaundice   . OA (osteoarthritis)   . Prediabetes   . Prostate cancer ([place])    Stage T1c , Gleason 3+4,  PSA 4.7,  vol 24cc--  scheduled for radiative seed  implants  . Psoriatic arthritis ([place])   . RA (rheumatoid arthritis) (HCC)    Dr. Toni Amend  . Sinus bradycardia   . Wears dentures       Family History  Problem Relation Age of Onset  . Heart attack Mother   . Coronary artery disease Father   . Ulcerative colitis Father   . Ulcers Father   . Breast cancer Sister   . COPD Brother   . Pancreatic cancer Brother   . Healthy Sister   . Diabetes Brother   . Cirrhosis Son   . 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   Social History   Tobacco Use  . Smoking status: Former Smoker    Packs/day: 1.00    Years: 30.00    Pack years: 30.00    Types: Cigarettes    Quit date: 05/21/1989    Years since quitting: 31.8  . Smokeless tobacco: Never Used  Substance Use Topics  . Alcohol use: Yes    Alcohol/week: 20.0 standard drinks    Types: 10 Shots of liquor, 10 Standard drinks or equivalent per week    Comment: 1 beer per day     Current Meds  Medication Sig  . Alcohol Swabs (B-D SINGLE USE SWABS REGULAR) PADS   .  allopurinol (ZYLOPRIM) 100 MG tablet Take 0.5 tablets (50 mg total) by mouth every other day.  Marland Kitchen amLODipine (NORVASC) 5 MG tablet Take 5 mg by mouth daily.  Marland Kitchen aspirin 81 MG tablet Take 1 tablet (81 mg total) by mouth 3 (three) times a week.  . Blood Glucose Monitoring Suppl (ACCU-CHEK GUIDE) w/Device KIT 1 Units by Does not apply route 2 (two) times daily.  . Dupilumab (DUPIXENT) 300 MG/2ML SOPN Inject 1 application into the skin every 14 (fourteen) days.  . folic acid (FOLVITE) 322 MCG tablet Take 400 mcg by mouth daily.  Marland Kitchen glucose blood (ACCU-CHEK GUIDE) test strip Use as instructed  . Glucose Blood (BLOOD GLUCOSE TEST STRIPS) STRP Inject 100 Units into the skin 2 (two) times daily. Please provide the brand name Contour test strips Diagnosis: E 11.9.  . Lancets (ACCU-CHEK SOFT TOUCH) lancets Use as instructed  . leflunomide (ARAVA) 10 MG tablet Take 1  tablet (10 mg total) by mouth daily.  Marland Kitchen losartan (COZAAR) 25 MG tablet Take 0.5 tablets (12.5 mg total) by mouth daily.  . nitroGLYCERIN (NITROSTAT) 0.4 MG SL tablet Place 0.4 mg under the tongue every 5 (five) minutes as needed for chest pain.   . rosuvastatin (CRESTOR) 40 MG tablet Take 40 mg by mouth daily.  Marland Kitchen triamcinolone (KENALOG) 0.1 % Apply 1 application topically daily as needed (rash).  . [DISCONTINUED] Cholecalciferol (DIALYVITE VITAMIN D 5000) 125 MCG (5000 UT) capsule Take 5,000 Units by mouth every other day.  . [DISCONTINUED] hydrALAZINE (APRESOLINE) 25 MG tablet Take 1 tablet (25 mg total) by mouth in the morning and at bedtime.    ROS:  Review of Systems  Constitutional: Negative for fever, malaise/fatigue and weight loss.  Respiratory: Negative for shortness of breath.   Cardiovascular: Negative for chest pain.  Neurological: Negative for dizziness and headaches.     Objective:   Today's Vitals: BP 138/76   Pulse 77   Temp 97.7 F (36.5 C) (Temporal)   Ht $R'5\' 10"'gS$  (1.778 m)   Wt 179 lb (81.2 kg)   SpO2 97%   BMI 25.68 kg/m  Vitals with BMI 03/14/2021 01/15/2021 12/07/2020  Height $Remov'5\' 10"'bsijWc$  $RemoveB'5\' 10"'WKZnmkMx$  $RemoveBef'5\' 10"'RuNCjqVLkV$   Weight 179 lbs 174 lbs 10 oz 175 lbs  BMI 25.68 02.54 27.06  Systolic 237 628 315  Diastolic 76 89 68  Pulse 77 71 90     Physical Exam Vitals reviewed.  Constitutional:      Appearance: Normal appearance.  HENT:     Head: Normocephalic and atraumatic.  Cardiovascular:     Rate and Rhythm: Normal rate and regular rhythm.  Pulmonary:     Effort: Pulmonary effort is normal.     Breath sounds: Normal breath sounds.  Musculoskeletal:     Cervical back: Neck supple.  Skin:    General: Skin is warm and dry.  Neurological:     Mental Status: He is alert and oriented to person, place, and time.  Psychiatric:        Mood and Affect: Mood normal.        Behavior: Behavior normal.        Thought Content: Thought content normal.        Judgment: Judgment  normal.          Assessment and Plan   1. Essential hypertension   2. Mixed hyperlipidemia   3. Stage 3b chronic kidney disease ([place])   4. Type 2 diabetes mellitus with hyperosmolarity without coma, without long-term current  use of insulin ([place])      Plan: 1.  Blood pressure is much improved compared to last time it was checked in the office.  For now he will continue on his current medications as prescribed. 2.  He will continue on his current medications as prescribed for his hyperlipidemia.  We will plan on checking lipid panel at next office visit. 3.  He will follow up with his nephrologist as scheduled.  Per shared decision making the patient will stop his vitamin D3 supplement and will have his nephrologist recheck his vitamin D level when he sees him about 1 month.  Consideration to restart vitamin D3 supplement will be made based upon those results. 4.  Seems to be currently well controlled lifestyle would not make any recommendations to make adjustments to medications at this time.  Will consider checking A1c at next office visit.   Tests ordered No orders of the defined types were placed in this encounter.     Meds ordered this encounter  Medications  . hydrALAZINE (APRESOLINE) 25 MG tablet    Sig: Take 2 tablets (50 mg total) by mouth in the morning and at bedtime.    Dispense:  180 tablet    Refill:  1    Order Specific Question:   Supervising Provider    Answer:   Doree Albee [[id number]]    Patient to follow-up in 3 months or sooner as needed.  Ailene Ards, NP

## 2021-03-20 ENCOUNTER — Telehealth: Payer: Self-pay

## 2021-03-20 ENCOUNTER — Other Ambulatory Visit: Payer: Self-pay | Admitting: *Deleted

## 2021-03-20 DIAGNOSIS — Z79899 Other long term (current) drug therapy: Secondary | ICD-10-CM

## 2021-03-20 NOTE — Telephone Encounter (Signed)
Patient's wife [name] called requesting nicolaus andel be sent to [facility] in [place] on [address].  He will be going tomorrow morning 03/21/21.

## 2021-03-20 NOTE — Telephone Encounter (Signed)
released

## 2021-03-21 DIAGNOSIS — Z79899 Other long term (current) drug therapy: Secondary | ICD-10-CM | POA: Diagnosis not present

## 2021-03-22 LAB — COMPLETE METABOLIC PANEL WITH GFR
AG Ratio: 1.5 (calc) (ref 1.0–2.5)
ALT: 26 U/L (ref 9–46)
AST: 23 U/L (ref 10–35)
Albumin: 4 g/dL (ref 3.6–5.1)
Alkaline phosphatase (APISO): 95 U/L (ref 35–144)
BUN/Creatinine Ratio: 10 (calc) (ref 6–22)
BUN: 25 mg/dL (ref 7–25)
CO2: 25 mmol/L (ref 20–32)
Calcium: 9 mg/dL (ref 8.6–10.3)
Chloride: 106 mmol/L (ref 98–110)
Creat: 2.39 mg/dL — ABNORMAL HIGH (ref 0.70–1.18)
GFR, Est African American: 29 mL/min/{1.73_m2} — ABNORMAL LOW (ref >=60)
GFR, Est Non African American: 25 mL/min/{1.73_m2} — ABNORMAL LOW (ref >=60)
Globulin: 2.7 g/dL (calc) (ref 1.9–3.7)
Glucose, Bld: 80 mg/dL (ref 65–139)
Potassium: 4.4 mmol/L (ref 3.5–5.3)
Sodium: 140 mmol/L (ref 135–146)
Total Bilirubin: 0.4 mg/dL (ref 0.2–1.2)
Total Protein: 6.7 g/dL (ref 6.1–8.1)

## 2021-03-22 LAB — CBC WITH DIFFERENTIAL/PLATELET
Absolute Monocytes: 821 cells/uL (ref 200–950)
Basophils Absolute: 29 cells/uL (ref 0–200)
Basophils Relative: 0.4 %
Eosinophils Absolute: 281 cells/uL (ref 15–500)
Eosinophils Relative: 3.9 %
HCT: 40.1 % (ref 38.5–50.0)
Hemoglobin: 12.9 g/dL — ABNORMAL LOW (ref 13.2–17.1)
Lymphs Abs: 1202 cells/uL (ref 850–3900)
MCH: 27.8 pg (ref 27.0–33.0)
MCHC: 32.2 g/dL (ref 32.0–36.0)
MCV: 86.4 fL (ref 80.0–100.0)
MPV: 11.5 fL (ref 7.5–12.5)
Monocytes Relative: 11.4 %
Neutro Abs: 4867 cells/uL (ref 1500–7800)
Neutrophils Relative %: 67.6 %
Platelets: 173 10*3/uL (ref 140–400)
RBC: 4.64 10*6/uL (ref 4.20–5.80)
RDW: 13.7 % (ref 11.0–15.0)
Total Lymphocyte: 16.7 %
WBC: 7.2 10*3/uL (ref 3.8–10.8)

## 2021-03-23 ENCOUNTER — Other Ambulatory Visit: Payer: Self-pay

## 2021-03-23 DIAGNOSIS — M0600 Rheumatoid arthritis without rheumatoid factor, unspecified site: Secondary | ICD-10-CM

## 2021-03-23 MED ORDER — LEFLUNOMIDE 10 MG PO TABS: 10.0000 mg | ORAL_TABLET | Freq: Every day | ORAL | 0 refills | Status: DC

## 2021-03-23 MED ORDER — ALLOPURINOL 100 MG PO TABS: 50.0000 mg | ORAL_TABLET | ORAL | 0 refills | Status: DC

## 2021-03-23 NOTE — Telephone Encounter (Signed)
  Next Visit: 06/11/2021  Last Visit: 01/15/2021  Last Fill: 01/15/2021   DX: Seronegative rheumatoid arthritis   Current Dose per office note 01/15/2021,  allopurinol (ZYLOPRIM) 100 MG tablet     Sig: Take 0.5 tablets (50 mg total) by mouth every other day.    Dispense:  23 tablet    Refill:  0  . leflunomide (ARAVA) 10 MG tablet    Sig: Take 1 tablet (10 mg total) by mouth daily.    Dispense:  90 tablet    Refill:  0     Labs: 03/21/2021, Hemoglobin 12.9, Creat 2.39, GRF 25,   Okay to refill Leflunomide and Allopurinol?

## 2021-03-23 NOTE — Telephone Encounter (Signed)
Patient's wife [name] called requesting prescription refills of Leflunomide and Allopurinol 90 day supply to [facility].  [name] also requested a return call to discuss the results of Jacob Farmer's labwork specifically his Creatinine.

## 2021-03-27 ENCOUNTER — Other Ambulatory Visit: Payer: Self-pay

## 2021-03-27 MED ORDER — TRIAMCINOLONE ACETONIDE 0.1 % EX CREA: 1.0000 "application " | TOPICAL_CREAM | Freq: Every day | CUTANEOUS | 3 refills | Status: DC | PRN

## 2021-04-03 ENCOUNTER — Other Ambulatory Visit: Payer: Self-pay | Admitting: Family Medicine

## 2021-04-03 ENCOUNTER — Telehealth: Payer: Self-pay | Admitting: *Deleted

## 2021-04-03 MED ORDER — TRIAMCINOLONE ACETONIDE 0.1 % EX CREA: 1.0000 "application " | TOPICAL_CREAM | Freq: Every day | CUTANEOUS | 3 refills | Status: DC | PRN

## 2021-04-03 NOTE — Telephone Encounter (Signed)
Refill request from [facility] for Triamcinolone cream- refill sent to pharmacy.

## 2021-04-30 DIAGNOSIS — N184 Chronic kidney disease, stage 4 (severe): Secondary | ICD-10-CM | POA: Diagnosis not present

## 2021-04-30 DIAGNOSIS — Z79899 Other long term (current) drug therapy: Secondary | ICD-10-CM | POA: Diagnosis not present

## 2021-04-30 DIAGNOSIS — R809 Proteinuria, unspecified: Secondary | ICD-10-CM | POA: Diagnosis not present

## 2021-04-30 DIAGNOSIS — E559 Vitamin D deficiency, unspecified: Secondary | ICD-10-CM | POA: Diagnosis not present

## 2021-04-30 DIAGNOSIS — I5032 Chronic diastolic (congestive) heart failure: Secondary | ICD-10-CM | POA: Diagnosis not present

## 2021-04-30 DIAGNOSIS — N189 Chronic kidney disease, unspecified: Secondary | ICD-10-CM | POA: Diagnosis not present

## 2021-04-30 DIAGNOSIS — D631 Anemia in chronic kidney disease: Secondary | ICD-10-CM | POA: Diagnosis not present

## 2021-04-30 DIAGNOSIS — I129 Hypertensive chronic kidney disease with stage 1 through stage 4 chronic kidney disease, or unspecified chronic kidney disease: Secondary | ICD-10-CM | POA: Diagnosis not present

## 2021-05-04 DIAGNOSIS — I129 Hypertensive chronic kidney disease with stage 1 through stage 4 chronic kidney disease, or unspecified chronic kidney disease: Secondary | ICD-10-CM | POA: Diagnosis not present

## 2021-05-04 DIAGNOSIS — E211 Secondary hyperparathyroidism, not elsewhere classified: Secondary | ICD-10-CM | POA: Diagnosis not present

## 2021-05-04 DIAGNOSIS — N184 Chronic kidney disease, stage 4 (severe): Secondary | ICD-10-CM | POA: Diagnosis not present

## 2021-05-04 DIAGNOSIS — N189 Chronic kidney disease, unspecified: Secondary | ICD-10-CM | POA: Diagnosis not present

## 2021-05-04 DIAGNOSIS — R809 Proteinuria, unspecified: Secondary | ICD-10-CM | POA: Diagnosis not present

## 2021-05-04 DIAGNOSIS — I5032 Chronic diastolic (congestive) heart failure: Secondary | ICD-10-CM | POA: Diagnosis not present

## 2021-05-04 DIAGNOSIS — D631 Anemia in chronic kidney disease: Secondary | ICD-10-CM | POA: Diagnosis not present

## 2021-05-17 ENCOUNTER — Telehealth: Payer: Self-pay

## 2021-05-17 NOTE — Telephone Encounter (Signed)
Patient's wife [name] called stating Mcarthur Rossetti will be faxing forms to be completed for Jonah's prescription of Leflunomide.  The reference number [id number]  [name] requested a return call to let her know the fax was received.

## 2021-05-17 NOTE — Telephone Encounter (Signed)
I called patient, PA form received

## 2021-05-21 NOTE — Telephone Encounter (Signed)
Patient advised the tier exception has been denied. Patient states he has an appointment to discuss treatment options at the end of this month. Patient wants a medication that he has a zero dollar copay.

## 2021-05-28 NOTE — Progress Notes (Addendum)
Office Visit Note  Patient: Jacob Farmer             Date of Birth: 09-18-1946           MRN: [id number]             PCP: Doree Albee, MD Referring: Doree Albee, MD Visit Date: 06/11/2021 Occupation: @GUAROCC @  Subjective:  Medication management.   History of Present Illness: Jacob Farmer is a 75 y.o. male history of seronegative rheumatoid arthritis and gouty arthropathy.  He states he has been doing well on leflunomide 10 mg p.o. daily.  He had a mild flare of gout recently which responded to [place].  He has been doing well.  He states he is currently in no discomfort.  He denies any history of joint swelling.  He does not have any lower back pain.  Activities of Daily Living:  Patient reports morning stiffness for 0 minutes.   Patient Reports nocturnal pain.  Difficulty dressing/grooming: Denies Difficulty climbing stairs: Denies Difficulty getting out of chair: Denies Difficulty using hands for taps, buttons, cutlery, and/or writing: Denies  Review of Systems  Constitutional:  Negative for fatigue.  HENT:  Negative for mouth sores, mouth dryness and nose dryness.   Eyes:  Negative for pain, itching and dryness.  Respiratory:  Negative for shortness of breath and difficulty breathing.   Cardiovascular:  Negative for chest pain and palpitations.  Gastrointestinal:  Negative for blood in stool, constipation and diarrhea.  Endocrine: Negative for increased urination.  Genitourinary:  Positive for urgency. Negative for difficulty urinating.  Musculoskeletal:  Negative for joint pain, joint pain, joint swelling, myalgias, morning stiffness, muscle tenderness and myalgias.  Skin:  Negative for color change, rash and redness.  Allergic/Immunologic: Negative for susceptible to infections.  Neurological:  Negative for dizziness, numbness, headaches, memory loss and weakness.  Hematological:  Negative for bruising/bleeding tendency.  Psychiatric/Behavioral:   Negative for confusion.    PMFS History:  Patient Active Problem List   Diagnosis Date Noted   Elevated LFTs 11/08/2020   History of gastric ulcer    Itching 09/01/2019   Abnormal weight loss 07/31/2019   Abnormal findings on diagnostic imaging of other abdominal regions, including retroperitoneum 07/29/2019   Fatigue 07/29/2019   Herniated cervical disc 05/26/2017    Class: Acute   Herniation of cervical intervertebral disc with radiculopathy 05/26/2017   Cervical spinal stenosis 05/26/2017   Lumbar disc disease 02/04/2017   Primary osteoarthritis of both hands 02/04/2017   History of gout 02/04/2017   DDD (degenerative disc disease), lumbar 02/04/2017   Pain in joint of left knee 02/04/2017   High risk medication use 01/28/2017   History of prostate cancer 01/28/2017   History of renal insufficiency syndrome 01/28/2017   Malignant neoplasm of prostate ([place]) 11/17/2015   Stage 3a chronic kidney disease ([place]) 12/27/2011   Type 2 diabetes mellitus with hyperosmolarity without coma, without long-term current use of insulin ([place]) 12/27/2011   Rheumatoid arthritis ([place]) 01/04/2011   SINUS BRADYCARDIA 01/12/2010   Arteriosclerotic cardiovascular disease (ASCVD) 01/12/2010   Hyperlipidemia 01/08/2010   Essential hypertension 01/08/2010    Past Medical History:  Diagnosis Date   CAD (coronary artery disease)    Acute myocardial infarction treated with TPA in 1990; 1999-stents to circumflex and RCA; residual total occlusion of the left anterior descending; normal ejection fraction; stress nuclear in 2001-distal anteroseptal ischemia; normal LV function   CKD (chronic kidney disease) stage 4, GFR 15-29 ml/min ([place])  First degree heart block    History of gastric ulcer    History of kidney stones    History of MI (myocardial infarction)    1990-  treated w/ TPA   Hyperlipidemia    Hypertension    Jaundice    OA (osteoarthritis)    Prediabetes    Prostate cancer (HCC)    Stage  T1c , Gleason 3+4,  PSA 4.7,  vol 24cc--  scheduled for radiative seed implants   Psoriatic arthritis ([place])    RA (rheumatoid arthritis) ([place])    Dr. Toni Amend   Sinus bradycardia    Wears dentures     Family History  Problem Relation Age of Onset   Heart attack Mother    Coronary artery disease Father    Ulcerative colitis Father    Ulcers Father    Breast cancer Sister    COPD Brother    Pancreatic cancer Brother    Healthy Sister    Diabetes Brother    Cirrhosis Son    Seizures Son    Past Surgical History:  Procedure Laterality Date   BIOPSY  08/27/2019   Procedure: BIOPSY;  Surgeon: Rogene Houston, MD;  [facility];  Service: Endoscopy;;  gastric   CARDIOVASCULAR STRESS TEST  2001  per Dr Lattie Haw clinic note   distal anteroseptal ischemia,  normal LVF   COLONOSCOPY  last one 2006 (approx)   CORONARY ANGIOPLASTY WITH STENT PLACEMENT  1999   DES x2  to CFX and RCA/  residual total occlusion LAD,  normal LVEF   ESOPHAGOGASTRODUODENOSCOPY (EGD) WITH PROPOFOL N/A 08/27/2019   Procedure: ESOPHAGOGASTRODUODENOSCOPY (EGD) WITH PROPOFOL;  Surgeon: Rogene Houston, MD;  Location[facility];  Service: Endoscopy;  Laterality: N/A;  10:10   EYE SURGERY Bilateral    cataract   POSTERIOR CERVICAL FUSION/FORAMINOTOMY N/A 05/26/2017   Procedure: RIGHT C4-5 FORAMINOTOMY WITH EXCISION OF HERNIATED [place];  Surgeon: Jessy Oto, MD;  Location: [facility];  Service: Orthopedics;  Laterality: N/A;   RADIOACTIVE SEED IMPLANT N/A 01/11/2016   Procedure: RADIOACTIVE SEED IMPLANT/BRACHYTHERAPY IMPLANT;  Surgeon: Franchot Gallo, MD;  Location[facility];  Service: Urology;  Laterality: N/A;   73  seeds implanted no seeds founds in bladder   Social History   Social History Narrative   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   Immunization History  Administered Date(s) Administered   Influenza, High Dose Seasonal PF 09/12/2017, 08/28/2019, 08/28/2019    Influenza-Unspecified 08/08/2020   PFIZER(Purple Top)SARS-COV-2 Vaccination 01/22/2020, 02/12/2020, 08/15/2020, 01/19/2021   Pneumococcal Polysaccharide-23 08/16/2009   Zoster Recombinat (Shingrix) 07/29/2019     Objective: Vital Signs: BP (!) 160/80 (BP Location: Left Arm, Patient Position: Sitting, Cuff Size: Normal)   Pulse 69   Resp 17   Ht 5\' 10"  (1.778 m)   Wt 177 lb 9.6 oz (80.6 kg)   BMI 25.48 kg/m    Physical Exam Vitals and nursing note reviewed.  Constitutional:      Appearance: He is well-developed.  HENT:     Head: Normocephalic and atraumatic.  Eyes:     Conjunctiva/sclera: Conjunctivae normal.     Pupils: Pupils are equal, round, and reactive to light.  Cardiovascular:     Rate and Rhythm: Normal rate and regular rhythm.     Heart sounds: Normal heart sounds.  Pulmonary:     Effort: Pulmonary effort is normal.     Breath sounds: Normal breath sounds.  Abdominal:     General: Bowel  sounds are normal.     Palpations: Abdomen is soft.  Musculoskeletal:     Cervical back: Normal range of motion and neck supple.  Skin:    General: Skin is warm and dry.     Capillary Refill: Capillary refill takes less than 2 seconds.  Neurological:     Mental Status: He is alert and oriented to person, place, and time.  Psychiatric:        Behavior: Behavior normal.     Musculoskeletal Exam: C-spine was in good range of motion.  Shoulder joints and elbow joints in good range of motion.  he had bilateral PIP and DIP thickening with no synovitis.  Hip joints and knee joints in good range of motion.  He had no tenderness over ankles or MTPs.  CDAI Exam: CDAI Score: 0.4  Patient Global: 2 mm; Provider Global: 2 mm Swollen: 0 ; Tender: 0  Joint Exam 06/11/2021   No joint exam has been documented for this visit   There is currently no information documented on the homunculus. Go to the Rheumatology activity and complete the homunculus joint exam.  Investigation: No  additional findings.  Imaging: No results found.  Recent Labs: Lab Results  Component Value Date   WBC 7.2 03/21/2021   HGB 12.9 (L) 03/21/2021   PLT 173 03/21/2021   NA 140 03/21/2021   K 4.4 03/21/2021   CL 106 03/21/2021   CO2 25 03/21/2021   GLUCOSE 80 03/21/2021   BUN 25 03/21/2021   CREATININE 2.39 (H) 03/21/2021   BILITOT 0.4 03/21/2021   ALKPHOS 254 (H) 11/06/2020   AST 23 03/21/2021   ALT 26 03/21/2021   PROT 6.7 03/21/2021   ALBUMIN 2.9 (L) 11/06/2020   CALCIUM 9.0 03/21/2021   GFRAA 29 (L) 03/21/2021   May 02, 2021 labs from [facility], protein creatinine ratio 0.870 CBC WBC 4.8, hemoglobin 12.1, platelets 183, BMP creatinine 2.61, GFR 23, PTH 89 November 01, 2020 uric acid 4.6   Speciality Comments: No specialty comments available.  Procedures:  No procedures performed Allergies: Patient has no known allergies.   Assessment / Plan:     Visit Diagnoses: Seronegative rheumatoid arthritis (HCC)-patient had no synovitis on examination.  He has been tolerating leflunomide well.  High risk medication use - Arava 10 mg 1 tablet by mouth daily. -I reviewed his labs from his nephrologist office.  CBC and BMP are stable.  He has been followed by nephrology closely.  Plan: ALT, AST.  Updated information regarding immunization was provided and instructions were placed in the AVS.  He has been advised to stop leflunomide in case he develops an infection and resume medication once the infection resolves.  Idiopathic chronic gout of multiple sites without tophus - allopurinol 50mg  by mouth every other day.  Patient states that he had mild flare of gout which responded to [place].  Dietary modifications were discussed.- Plan: Uric acid  Primary osteoarthritis of both hands-he has osteoarthritis in hands.  Joint protection was discussed.  DDD (degenerative disc disease), lumbar - followed by Dr. Louanne Skye  History of hypertension-his blood pressure is  elevated.  Other medical problems are listed as follows:  History of coronary artery disease-increased risk of heart disease with rheumatoid arthritis was also discussed.  Dietary modifications and need for regular exercise was emphasized.  Instructions were placed in the AVS.  History of hypercholesterolemia  History of renal insufficiency syndrome - Followed by Dr. Theador Hawthorne.  History of gastroesophageal reflux (GERD)  History  of prostate cancer  Osteoporosis screening-DEXA scan from February 08, 2021 was reviewed which was normal.  Orders: Orders Placed This Encounter  Procedures   Uric acid   ALT   AST    Meds ordered this encounter  Medications   allopurinol (ZYLOPRIM) 100 MG tablet    Sig: Take 0.5 tablets (50 mg total) by mouth every other day.    Dispense:  45 tablet    Refill:  0     Follow-Up Instructions: Return in about 5 months (around 11/11/2021) for Rheumatoid arthritis.   Bo Merino, MD  Note - This record has been created using Editor, commissioning.  Chart creation errors have been sought, but may not always  have been located. Such creation errors do not reflect on  the standard of medical care.

## 2021-05-30 ENCOUNTER — Other Ambulatory Visit: Payer: Self-pay

## 2021-05-30 ENCOUNTER — Telehealth (INDEPENDENT_AMBULATORY_CARE_PROVIDER_SITE_OTHER): Payer: Medicare HMO | Admitting: Cardiology

## 2021-05-30 ENCOUNTER — Encounter: Payer: Self-pay | Admitting: Cardiology

## 2021-05-30 VITALS — BP 137/71 | HR 72 | Ht 70.0 in | Wt 182.0 lb

## 2021-05-30 DIAGNOSIS — I495 Sick sinus syndrome: Secondary | ICD-10-CM

## 2021-05-30 DIAGNOSIS — I10 Essential (primary) hypertension: Secondary | ICD-10-CM | POA: Diagnosis not present

## 2021-05-30 DIAGNOSIS — I951 Orthostatic hypotension: Secondary | ICD-10-CM | POA: Diagnosis not present

## 2021-05-30 DIAGNOSIS — I2510 Atherosclerotic heart disease of native coronary artery without angina pectoris: Secondary | ICD-10-CM

## 2021-05-30 DIAGNOSIS — E782 Mixed hyperlipidemia: Secondary | ICD-10-CM | POA: Diagnosis not present

## 2021-05-30 MED ORDER — AMLODIPINE BESYLATE 5 MG PO TABS: 1.0000 | ORAL_TABLET | Freq: Every day | ORAL | 3 refills | Status: DC

## 2021-05-30 MED ORDER — ROSUVASTATIN CALCIUM 40 MG PO TABS: 40.0000 mg | ORAL_TABLET | Freq: Every day | ORAL | 3 refills | Status: DC

## 2021-05-30 NOTE — Progress Notes (Signed)
Virtual Visit via Telephone Note   This visit type was conducted due to national recommendations for restrictions regarding the COVID-19 Pandemic (e.g. social distancing) in an effort to limit this patient's exposure and mitigate transmission in our community.  Due to his co-morbid illnesses, this patient is at least at moderate risk for complications without adequate follow up.  This format is felt to be most appropriate for this patient at this time.  The patient did not have access to video technology/had technical difficulties with video requiring transitioning to audio format only (telephone).  All issues noted in this document were discussed and addressed.  No physical exam could be performed with this format.  Please refer to the patient's chart for his  consent to telehealth for [facility].    Date:  05/30/2021   ID:  Jacob Farmer, DOB [birth year], MRN [id number] The patient was identified using 2 identifiers.  Patient Location: Home Provider Location: Office/Clinic   PCP:  Doree Albee, MD   [facility] Providers Cardiologist:  Carlyle Dolly, MD {   Evaluation Performed:  Follow-Up Visit  Chief Complaint:  Follow up  History of Present Illness:    Jacob Farmer is a 75 y.o. male seen today for follow up of the following medical problems.   1. Tachy/Bradycardia - ER visit9/2021 with bradycardia. HRs mid 30s in nephrology clinic, sent to ER - has had prior issues with bradycardia, we had been weaning his home beta blocker. At ER visit beta blocker was stopped - no significant symptoms at that time, was discharged from ER with outpatient monitor.  - zio patch with frequent supraventricular ectopy, runs of atach. Rare episodes of sinus brady to 30s in AM hours, unclear if sleeping. Of note he reports no significant dizziness episodes while wearing monitor.     08/2020 event monitor: Min HR 32, Max HR 136, Avg 69. Frequent PACs, short runs of atach. Episode  of severe sinus brady at 32 at 7am, unclear if sleeping  - no recent symptoms   2. Orthostatic hypotension -at prior clinic visit SBP drops 36 points with standing, DBP 26 points.   - no recent symptoms - working to stay well hydrated  3. CAD  - multiple interventions as described below    - no recent chest pains. No SOB or DOE       4. HTN  - home bp's usually 130s/70s     5 Hyperlipidemia   - 06/2019 TC 129 TG 140 HDL 31 LDL 76 04/2020 TC 141 HDL 35 TG 136 LDL 83 - was off statin due to LFT elevation workup. He is back on statin, LFTs have normalized.      4. CKD III - followed by renal Dr Theador Hawthorne   5. Prostate CA - had radioactive seeds placed, followed by urology.      6. Rheumatoid arthritis - followed by rheum      The patient does not have symptoms concerning for COVID-19 infection (fever, chills, cough, or new shortness of breath).    Past Medical History:  Diagnosis Date   CAD (coronary artery disease)    Acute myocardial infarction treated with TPA in 1990; 1999-stents to circumflex and RCA; residual total occlusion of the left anterior descending; normal ejection fraction; stress nuclear in 2001-distal anteroseptal ischemia; normal LV function   CKD (chronic kidney disease) stage 4, GFR 15-29 ml/min (HCC)    First degree heart block    History of gastric ulcer  History of kidney stones    History of MI (myocardial infarction)    1990-  treated w/ TPA   Hyperlipidemia    Hypertension    Jaundice    OA (osteoarthritis)    Prediabetes    Prostate cancer (HCC)    Stage T1c , Gleason 3+4,  PSA 4.7,  vol 24cc--  scheduled for radiative seed implants   Psoriatic arthritis ([place])    RA (rheumatoid arthritis) ([place])    Dr. Toni Amend   Sinus bradycardia    Wears dentures    Past Surgical History:  Procedure Laterality Date   BIOPSY  08/27/2019   Procedure: BIOPSY;  Surgeon: Rogene Houston, MD;  [facility];  Service: Endoscopy;;   gastric   CARDIOVASCULAR STRESS TEST  2001  per Dr Lattie Haw clinic note   distal anteroseptal ischemia,  normal LVF   COLONOSCOPY  last one 2006 (approx)   [place]   DES x2  to CFX and RCA/  residual total occlusion LAD,  normal LVEF   ESOPHAGOGASTRODUODENOSCOPY (EGD) WITH PROPOFOL N/A 08/27/2019   Procedure: ESOPHAGOGASTRODUODENOSCOPY (EGD) WITH PROPOFOL;  Surgeon: Rogene Houston, MD;  Location: [facility];  Service: Endoscopy;  Laterality: N/A;  10:10   EYE SURGERY Bilateral    cataract   POSTERIOR CERVICAL FUSION/FORAMINOTOMY N/A 05/26/2017   Procedure: RIGHT C4-5 FORAMINOTOMY WITH EXCISION OF HERNIATED [place];  Surgeon: Jessy Oto, MD;  Location: [facility];  Service: Orthopedics;  Laterality: N/A;   RADIOACTIVE SEED IMPLANT N/A 01/11/2016   Procedure: RADIOACTIVE SEED IMPLANT/BRACHYTHERAPY IMPLANT;  Surgeon: Franchot Gallo, MD;  Location: [facility];  Service: Urology;  Laterality: N/A;   96  seeds implanted no seeds founds in bladder     No outpatient medications have been marked as taking for the 05/30/21 encounter (Appointment) with Arnoldo Lenis, MD.     Allergies:   Patient has no known allergies.   Social History   Tobacco Use   Smoking status: Former    Packs/day: 1.00    Years: 30.00    Pack years: 30.00    Types: Cigarettes    Quit date: 05/21/1989    Years since quitting: 32.0   Smokeless tobacco: Never  Vaping Use   Vaping Use: Never used  Substance Use Topics   Alcohol use: Yes    Alcohol/week: 20.0 standard drinks    Types: 10 Shots of liquor, 10 Standard drinks or equivalent per week    Comment: 1 beer per day   Drug use: No     Family Hx: The patient's family history includes Breast cancer in his sister; COPD in his brother; Cirrhosis in his son; Coronary artery disease in his father; Diabetes in his brother; Healthy in his sister; Heart attack in his mother; Pancreatic cancer in his brother;  Seizures in his son; Ulcerative colitis in his father; Ulcers in his father.  ROS:   Please see the history of present illness.     All other systems reviewed and are negative.   Prior CV studies:   The following studies were reviewed today:  08/2020 event monitor 7 day zio patch Min HR 32, Max HR 136, Avg HR 69 Frequent supraventricular ectopy in the form of isolated PACs (14%), couplets (2.6%), triplets (1.8%).Short runs of atrial tachycardia Rare ventricular ectopy in the form of isolated PVCs and couplets Episode of severe sinus bradycardia to 32 at 7AM, unclear if sleeping. No symptoms reported  Labs/Other Tests and Data Reviewed:    EKG:  No ECG reviewed.  Recent Labs: 08/08/2020: TSH 2.52 08/25/2020: Magnesium 2.4 03/21/2021: ALT 26; BUN 25; Creat 2.39; Hemoglobin 12.9; Platelets 173; Potassium 4.4; Sodium 140   Recent Lipid Panel Lab Results  Component Value Date/Time   CHOL 141 05/12/2020 08:04 AM   TRIG 136 05/12/2020 08:04 AM   HDL 35 (L) 05/12/2020 08:04 AM   CHOLHDL 4.0 05/12/2020 08:04 AM   LDLCALC 83 05/12/2020 08:04 AM    Wt Readings from Last 3 Encounters:  03/14/21 179 lb (81.2 kg)  01/15/21 174 lb 9.6 oz (79.2 kg)  12/07/20 175 lb (79.4 kg)     Risk Assessment/Calculations:      Objective:    Vital Signs:   Today's Vitals   05/30/21 1113  BP: 137/71  Pulse: 72  Weight: 182 lb (82.6 kg)  Height: 5\' 10"  (1.778 m)   Body mass index is 26.11 kg/m. Normal affect. Normal speech pattern and tone. Comfortable, no apparent distress. No audible signs of sob or wheezing.   ASSESSMENT & PLAN:   1. Tachycardia/bradycardia - no recent symptoms, he is off all av nodal agents - contineu to monitor     2. Orthostatic hypotension - no recent symptoms, continue aggressive hydration   3. CAD - no symptoms, contine current meds   4. HTN - reasonable control at home, continue current meds   5. Hyperlipidemia - temporarily off statin due to prior  LFT elevation, it appears from workup alternative etiology was found and his statin was restarted        COVID-19 Education: The signs and symptoms of COVID-19 were discussed with the patient and how to seek care for testing (follow up with PCP or arrange E-visit).  The importance of social distancing was discussed today.  Time:   Today, I have spent 20 minutes with the patient with telehealth technology discussing the above problems.     Medication Adjustments/Labs and Tests Ordered: Current medicines are reviewed at length with the patient today.  Concerns regarding medicines are outlined above.   Tests Ordered: No orders of the defined types were placed in this encounter.   Medication Changes: No orders of the defined types were placed in this encounter.   Follow Up:  In Person  11months  Signed, Carlyle Dolly, MD  05/30/2021 8:19 AM    [place]

## 2021-05-30 NOTE — Patient Instructions (Signed)
Medication Instructions:    Your physician recommends that you continue on your current medications as directed. Please refer to the Current Medication list given to you today. *If you need a refill on your cardiac medications before your next appointment, please call your pharmacy*   Lab Work:  NONE ORDERED  TODAY   If you have labs (blood work) drawn today and your tests are completely normal, you will receive your results only by: MyChart Message (if you have MyChart) OR A paper copy in the mail If you have any lab test that is abnormal or we need to change your treatment, we will call you to review the results.   Testing/Procedures: .NONE ORDERED  TODAY     Follow-Up: At [facility], you and your health needs are our priority.  As part of our continuing mission to provide you with exceptional heart care, we have created designated Provider Care Teams.  These Care Teams include your primary Cardiologist (physician) and Advanced Practice Providers (APPs -  Physician Assistants and Nurse Practitioners) who all work together to provide you with the care you need, when you need it.  We recommend signing up for the patient portal called "MyChart".  Sign up information is provided on this After Visit Summary.  MyChart is used to connect with patients for Virtual Visits (Telemedicine).  Patients are able to view lab/test results, encounter notes, upcoming appointments, etc.  Non-urgent messages can be sent to your provider as well.   To learn more about what you can do with MyChart, go to [email or web address].    Your next appointment:   6 month(s)  The format for your next appointment:   In Person  Provider:   Jonathan Branch, MD   Other Instructions   

## 2021-05-31 ENCOUNTER — Telehealth: Payer: Self-pay

## 2021-05-31 NOTE — Telephone Encounter (Signed)
Spoke with [name] and advised Jacob Farmer is not due for labs at this time. Patient's last labs were 03/21/2021.

## 2021-05-31 NOTE — Telephone Encounter (Signed)
Jacob Farmer requested a return call to let her know if [place] is due for labwork.

## 2021-06-11 ENCOUNTER — Ambulatory Visit (INDEPENDENT_AMBULATORY_CARE_PROVIDER_SITE_OTHER): Payer: Medicare HMO | Admitting: Rheumatology

## 2021-06-11 ENCOUNTER — Encounter: Payer: Self-pay | Admitting: Rheumatology

## 2021-06-11 ENCOUNTER — Other Ambulatory Visit: Payer: Self-pay

## 2021-06-11 VITALS — BP 160/80 | HR 69 | Resp 17 | Ht 70.0 in | Wt 177.6 lb

## 2021-06-11 DIAGNOSIS — M0600 Rheumatoid arthritis without rheumatoid factor, unspecified site: Secondary | ICD-10-CM

## 2021-06-11 DIAGNOSIS — Z8719 Personal history of other diseases of the digestive system: Secondary | ICD-10-CM | POA: Diagnosis not present

## 2021-06-11 DIAGNOSIS — M19041 Primary osteoarthritis, right hand: Secondary | ICD-10-CM | POA: Diagnosis not present

## 2021-06-11 DIAGNOSIS — Z87448 Personal history of other diseases of urinary system: Secondary | ICD-10-CM | POA: Diagnosis not present

## 2021-06-11 DIAGNOSIS — Z8639 Personal history of other endocrine, nutritional and metabolic disease: Secondary | ICD-10-CM | POA: Diagnosis not present

## 2021-06-11 DIAGNOSIS — Z8679 Personal history of other diseases of the circulatory system: Secondary | ICD-10-CM

## 2021-06-11 DIAGNOSIS — Z8546 Personal history of malignant neoplasm of prostate: Secondary | ICD-10-CM

## 2021-06-11 DIAGNOSIS — Z79899 Other long term (current) drug therapy: Secondary | ICD-10-CM

## 2021-06-11 DIAGNOSIS — M1A09X0 Idiopathic chronic gout, multiple sites, without tophus (tophi): Secondary | ICD-10-CM | POA: Diagnosis not present

## 2021-06-11 DIAGNOSIS — M5136 Other intervertebral disc degeneration, lumbar region: Secondary | ICD-10-CM | POA: Diagnosis not present

## 2021-06-11 DIAGNOSIS — Z13820 Encounter for screening for osteoporosis: Secondary | ICD-10-CM

## 2021-06-11 DIAGNOSIS — M19042 Primary osteoarthritis, left hand: Secondary | ICD-10-CM

## 2021-06-11 MED ORDER — ALLOPURINOL 100 MG PO TABS
50.0000 mg | ORAL_TABLET | ORAL | 0 refills | Status: DC
Start: 2021-06-11 — End: 2021-09-06

## 2021-06-11 NOTE — Progress Notes (Signed)
Medication Samples have been provided to the patient.  Drug name: mitigare       Strength: 0.6mg         Qty: 2 bottles  LOT: [id number]  Exp.Date: 10/15/2022   Dosing instructions: Take 1 capsule by mouth as needed.

## 2021-06-11 NOTE — Patient Instructions (Addendum)
Standing Labs We placed an order today for your standing lab work.   Please have your standing labs (CBC with differential and CMP with GFR) drawn in August and every 3 months  If possible, please have your labs drawn 2 weeks prior to your appointment so that the provider can discuss your results at your appointment.  Please note that you may see your imaging and lab results in [place] before we have reviewed them. We may be awaiting multiple results to interpret others before contacting you. Please allow our office up to 72 hours to thoroughly review all of the results before contacting the office for clarification of your results.  We have open lab daily: Monday through Thursday from 1:30-4:30 PM and Friday from 1:30-4:00 PM at the office of Dr. Bo Merino, [facility].   Please be advised, all patients with office appointments requiring lab work will take precedent over walk-in lab work.  If possible, please come for your lab work on Monday and Friday afternoons, as you may experience shorter wait times. The office is located at [address], [place], [place], [place] [address] No appointment is necessary.   Labs are drawn by [facility]. Please bring your co-pay at the time of your lab draw.  You may receive a bill from [facility] for your lab work.  If you wish to have your labs drawn at another location, please call the office 24 hours in advance to send orders.  If you have any questions regarding directions or hours of operation,  please call [phone].   As a reminder, please drink plenty of water prior to coming for your lab work. Thanks!   If you test POSITIVE for COVID19 and have MILD to MODERATE symptoms: First, call your PCP if you would like to receive COVID19 treatment AND Hold your medications during the infection and for at least 1 week after your symptoms have resolved: Injectable medication (Benlysta, Cimzia, Cosentyx, Enbrel, Humira, Orencia, Remicade,  Simponi, Stelara, Taltz, Tremfya) Methotrexate Leflunomide (Arava) Mycophenolate (Cellcept) Jacob Farmer, Olumiant, or Rinvoq If you take Actemra or Kevzara, you DO NOT need to hold these for COVID19 infection.  If you test POSITIVE for COVID19 and have NO symptoms: First, call your PCP if you would like to receive COVID19 treatment AND Hold your medications for at least 10 days after the day that you tested positive Injectable medication (Benlysta, Cimzia, Cosentyx, Enbrel, Humira, Orencia, Remicade, Simponi, Stelara, Taltz, Tremfya) Methotrexate Leflunomide (Arava) Mycophenolate (Cellcept) Jacob Farmer, Olumiant, or Rinvoq If you take Actemra or Kevzara, you DO NOT need to hold these for COVID19 infection.  If you have signs or symptoms of an infection or start antibiotics: First, call your PCP for workup of your infection. Hold your medication through the infection, until you complete your antibiotics, and until symptoms resolve if you take the following: Injectable medication (Actemra, Benlysta, Cimzia, Cosentyx, Enbrel, Humira, Kevzara, Orencia, Remicade, Simponi, Stelara, Taltz, Tremfya) Methotrexate Leflunomide (Arava) Mycophenolate (Cellcept) Jacob Farmer, Olumiant, or Rinvoq  Heart Disease Prevention   Your inflammatory disease increases your risk of heart disease which includes heart attack, stroke, atrial fibrillation (irregular heartbeats), high blood pressure, heart failure and atherosclerosis (plaque in the arteries).  It is important to reduce your risk by:   Keep blood pressure, cholesterol, and blood sugar at healthy levels   Smoking Cessation   Maintain a healthy weight  BMI 20-25   Eat a healthy diet  Plenty of fresh fruit, vegetables, and whole grains  Limit saturated fats, foods  high in sodium, and added sugars  DASH and Mediterranean diet   Increase physical activity  Recommend moderate physically activity for 150 minutes per week/ 30 minutes a day for five days a week  These can be broken up into three separate ten-minute sessions during the day.   Reduce Stress  Meditation, slow breathing exercises, yoga, coloring books  Dental visits twice a year

## 2021-06-12 LAB — ALT: ALT: 16 U/L (ref 9–46)

## 2021-06-12 LAB — URIC ACID: Uric Acid, Serum: 5.6 mg/dL (ref 4.0–8.0)

## 2021-06-12 LAB — AST: AST: 18 U/L (ref 10–35)

## 2021-06-12 NOTE — Progress Notes (Signed)
AST and ALT are normal.  Uric acid is in desirable range.

## 2021-06-14 ENCOUNTER — Other Ambulatory Visit: Payer: Self-pay

## 2021-06-14 ENCOUNTER — Encounter (INDEPENDENT_AMBULATORY_CARE_PROVIDER_SITE_OTHER): Payer: Self-pay | Admitting: Nurse Practitioner

## 2021-06-14 ENCOUNTER — Ambulatory Visit (INDEPENDENT_AMBULATORY_CARE_PROVIDER_SITE_OTHER): Payer: Medicare HMO | Admitting: Nurse Practitioner

## 2021-06-14 VITALS — BP 144/74 | HR 75 | Temp 97.5°F | Ht 70.0 in | Wt 179.0 lb

## 2021-06-14 DIAGNOSIS — N1831 Chronic kidney disease, stage 3a: Secondary | ICD-10-CM | POA: Diagnosis not present

## 2021-06-14 DIAGNOSIS — E782 Mixed hyperlipidemia: Secondary | ICD-10-CM

## 2021-06-14 DIAGNOSIS — I10 Essential (primary) hypertension: Secondary | ICD-10-CM

## 2021-06-14 DIAGNOSIS — E119 Type 2 diabetes mellitus without complications: Secondary | ICD-10-CM | POA: Diagnosis not present

## 2021-06-14 DIAGNOSIS — E559 Vitamin D deficiency, unspecified: Secondary | ICD-10-CM | POA: Diagnosis not present

## 2021-06-14 NOTE — Progress Notes (Signed)
Subjective:  Patient ID: Jacob Farmer, male    DOB: [birth year]  Age: 75 y.o. MRN: [id number]  CC:  Chief Complaint  Patient presents with   Follow-up    Doing good, no concerns   Hypertension   Diabetes   Chronic Kidney Disease   Hyperlipidemia   Other    Vitamin D deficiency      HPI  This patient arrives today for the above.  Hypertension: He continues on his antihypertensives and is tolerating these well.  He denies any recent chest pain.  Diabetes: Last A1c was 6.3 he will be due for this to be checked again today.  He is on ARB and statin.  He is not on any other medications for treatment of his diabetes currently.  Chronic kidney disease: He follows with nephrology on a regular basis.  Hyperlipidemia: Continues on rosuvastatin and aspirin.  Last LDL was 83 this was collected about a year ago.  Vitamin D deficiency: He is recently stopped his vitamin D3 supplement to see how his serum level may change.  He is due to have this checked today, to determine if he needs to restart his vitamin D3 supplement.  Past Medical History:  Diagnosis Date   CAD (coronary artery disease)    Acute myocardial infarction treated with TPA in 1990; 1999-stents to circumflex and RCA; residual total occlusion of the left anterior descending; normal ejection fraction; stress nuclear in 2001-distal anteroseptal ischemia; normal LV function   CKD (chronic kidney disease) stage 4, GFR 15-29 ml/min (HCC)    First degree heart block    History of gastric ulcer    History of kidney stones    History of MI (myocardial infarction)    1990-  treated w/ TPA   Hyperlipidemia    Hypertension    Jaundice    OA (osteoarthritis)    Prediabetes    Prostate cancer (HCC)    Stage T1c , Gleason 3+4,  PSA 4.7,  vol 24cc--  scheduled for radiative seed implants   Psoriatic arthritis ([place])    RA (rheumatoid arthritis) ([place])    Dr. Toni Amend   Sinus bradycardia    Wears dentures       Family  History  Problem Relation Age of Onset   Heart attack Mother    Coronary artery disease Father    Ulcerative colitis Father    Ulcers Father    Breast cancer Sister    COPD Brother    Pancreatic cancer Brother    Healthy Sister    Diabetes Brother    Cirrhosis Son    Seizures Son     Social History   Social History Narrative   Married for 24 years.Retired Clinical biochemist.   Social History   Tobacco Use   Smoking status: Former    Packs/day: 1.00    Years: 30.00    Pack years: 30.00    Types: Cigarettes    Quit date: 05/21/1989    Years since quitting: 32.0   Smokeless tobacco: Never  Substance Use Topics   Alcohol use: Yes    Alcohol/week: 20.0 standard drinks    Types: 10 Shots of liquor, 10 Standard drinks or equivalent per week    Comment: 1 beer per day     Current Meds  Medication Sig   Alcohol Swabs (B-D SINGLE USE SWABS REGULAR) PADS    allopurinol (ZYLOPRIM) 100 MG tablet Take 0.5 tablets (50 mg total) by mouth every other day.  amLODipine (NORVASC) 5 MG tablet Take 1 tablet (5 mg total) by mouth daily.   aspirin 81 MG tablet Take 1 tablet (81 mg total) by mouth 3 (three) times a week.   Blood Glucose Monitoring Suppl (ACCU-CHEK GUIDE) w/Device KIT 1 Units by Does not apply route 2 (two) times daily.   Colchicine 0.6 MG CAPS Take 0.6 mg by mouth as needed.   Dupilumab (DUPIXENT) 300 MG/2ML SOPN Inject 1 application into the skin every 14 (fourteen) days.   folic acid (FOLVITE) 101 MCG tablet Take 400 mcg by mouth daily.   glucose blood (ACCU-CHEK GUIDE) test strip Use as instructed   Glucose Blood (BLOOD GLUCOSE TEST STRIPS) STRP Inject 100 Units into the skin 2 (two) times daily. Please provide the brand name Contour test strips Diagnosis: E 11.9.   hydrALAZINE (APRESOLINE) 25 MG tablet Take 50 mg by mouth 2 (two) times daily.   Lancets (ACCU-CHEK SOFT TOUCH) lancets Use as instructed   leflunomide (ARAVA) 10 MG tablet Take 1 tablet (10 mg total) by mouth  daily.   losartan (COZAAR) 25 MG tablet Take 0.5 tablets (12.5 mg total) by mouth daily.   nitroGLYCERIN (NITROSTAT) 0.4 MG SL tablet Place 0.4 mg under the tongue every 5 (five) minutes as needed for chest pain.    rosuvastatin (CRESTOR) 40 MG tablet Take 1 tablet (40 mg total) by mouth daily.   triamcinolone cream (KENALOG) 0.1 % Apply 1 application topically daily as needed (rash).    ROS:  Review of Systems  Constitutional:  Negative for malaise/fatigue.  Respiratory:  Negative for shortness of breath.   Cardiovascular:  Negative for chest pain.  Gastrointestinal:  Negative for abdominal pain.  Neurological:  Negative for dizziness, loss of consciousness and headaches.    Objective:   Today's Vitals: BP (!) 144/74   Pulse 75   Temp (!) 97.5 F (36.4 C) (Temporal)   Ht $R'5\' 10"'tK$  (1.778 m)   Wt 179 lb (81.2 kg)   SpO2 97%   BMI 25.68 kg/m  Vitals with BMI 06/14/2021 06/11/2021 05/30/2021  Height $Remov'5\' 10"'iXRziY$  $RemoveB'5\' 10"'mZfFnoDi$  $RemoveBef'5\' 10"'wlxtBhDJtW$   Weight 179 lbs 177 lbs 10 oz 182 lbs  BMI 25.68 75.10 25.85  Systolic 277 824 235  Diastolic 74 80 71  Pulse 75 69 72     Physical Exam Vitals reviewed.  Constitutional:      Appearance: Normal appearance.  HENT:     Head: Normocephalic and atraumatic.  Cardiovascular:     Rate and Rhythm: Normal rate and regular rhythm.  Pulmonary:     Effort: Pulmonary effort is normal.     Breath sounds: Normal breath sounds.  Musculoskeletal:     Cervical back: Neck supple.  Skin:    General: Skin is warm and dry.  Neurological:     Mental Status: He is alert and oriented to person, place, and time.  Psychiatric:        Mood and Affect: Mood normal.        Behavior: Behavior normal.        Thought Content: Thought content normal.        Judgment: Judgment normal.         Assessment and Plan   1. Vitamin D deficiency disease   2. Type 2 diabetes mellitus without complication, without long-term current use of insulin ([place])   3. Mixed hyperlipidemia   4.  Essential hypertension   5. Stage 3a chronic kidney disease ([place])      Plan:  1.  We will check serum vitamin D level today. 2.  We will check A1c today.  He will continue on his ARB and statin. 3.  We will check lipid panel today. 4.  He will continue on his antihypertensives as prescribed. 5.  He will follow-up with nephrology as scheduled.   Tests ordered Orders Placed This Encounter  Procedures   Vitamin D, 25-hydroxy   CMP with eGFR([facility])   Hemoglobin A1c   Lipid Panel      No orders of the defined types were placed in this encounter.   Patient to follow-up in 3 months or sooner as needed for annual wellness visit as well as a follow-up.  Ailene Ards, NP

## 2021-06-15 LAB — COMPLETE METABOLIC PANEL WITH GFR
AG Ratio: 1.5 (calc) (ref 1.0–2.5)
ALT: 13 U/L (ref 9–46)
AST: 16 U/L (ref 10–35)
Albumin: 3.7 g/dL (ref 3.6–5.1)
Alkaline phosphatase (APISO): 82 U/L (ref 35–144)
BUN/Creatinine Ratio: 11 (calc) (ref 6–22)
BUN: 28 mg/dL — ABNORMAL HIGH (ref 7–25)
CO2: 25 mmol/L (ref 20–32)
Calcium: 8.8 mg/dL (ref 8.6–10.3)
Chloride: 106 mmol/L (ref 98–110)
Creat: 2.58 mg/dL — ABNORMAL HIGH (ref 0.70–1.18)
GFR, Est African American: 27 mL/min/{1.73_m2} — ABNORMAL LOW (ref >=60)
GFR, Est Non African American: 23 mL/min/{1.73_m2} — ABNORMAL LOW (ref >=60)
Globulin: 2.5 g/dL (calc) (ref 1.9–3.7)
Glucose, Bld: 111 mg/dL (ref 65–139)
Potassium: 4.5 mmol/L (ref 3.5–5.3)
Sodium: 139 mmol/L (ref 135–146)
Total Bilirubin: 0.5 mg/dL (ref 0.2–1.2)
Total Protein: 6.2 g/dL (ref 6.1–8.1)

## 2021-06-15 LAB — LIPID PANEL
Cholesterol: 113 mg/dL (ref <200)
HDL: 49 mg/dL (ref >=40)
LDL Cholesterol (Calc): 48 mg/dL (calc)
Non-HDL Cholesterol (Calc): 64 mg/dL (calc) (ref <130)
Total CHOL/HDL Ratio: 2.3 (calc) (ref <5.0)
Triglycerides: 82 mg/dL (ref <150)

## 2021-06-15 LAB — HEMOGLOBIN A1C
Hgb A1c MFr Bld: 5.4 % of total Hgb (ref <5.7)
Mean Plasma Glucose: 108 mg/dL
eAG (mmol/L): 6 mmol/L

## 2021-06-15 LAB — VITAMIN D 25 HYDROXY (VIT D DEFICIENCY, FRACTURES): Vit D, 25-Hydroxy: 64 ng/mL (ref 30–100)

## 2021-06-21 ENCOUNTER — Other Ambulatory Visit: Payer: Self-pay | Admitting: Physician Assistant

## 2021-06-25 DIAGNOSIS — Z0100 Encounter for examination of eyes and vision without abnormal findings: Secondary | ICD-10-CM | POA: Diagnosis not present

## 2021-06-25 DIAGNOSIS — E109 Type 1 diabetes mellitus without complications: Secondary | ICD-10-CM | POA: Diagnosis not present

## 2021-06-25 LAB — HM DIABETES EYE EXAM

## 2021-07-19 ENCOUNTER — Encounter (INDEPENDENT_AMBULATORY_CARE_PROVIDER_SITE_OTHER): Payer: Self-pay

## 2021-07-30 ENCOUNTER — Telehealth: Payer: Self-pay

## 2021-07-30 DIAGNOSIS — Z79899 Other long term (current) drug therapy: Secondary | ICD-10-CM

## 2021-07-30 NOTE — Telephone Encounter (Signed)
Lab orders released.  

## 2021-07-30 NOTE — Telephone Encounter (Signed)
Patient called requesting his labwork orders be sent to [facility] in [place].  Patient will be going some day this week.

## 2021-08-02 DIAGNOSIS — Z79899 Other long term (current) drug therapy: Secondary | ICD-10-CM | POA: Diagnosis not present

## 2021-08-02 DIAGNOSIS — N184 Chronic kidney disease, stage 4 (severe): Secondary | ICD-10-CM | POA: Diagnosis not present

## 2021-08-02 DIAGNOSIS — D631 Anemia in chronic kidney disease: Secondary | ICD-10-CM | POA: Diagnosis not present

## 2021-08-02 DIAGNOSIS — I129 Hypertensive chronic kidney disease with stage 1 through stage 4 chronic kidney disease, or unspecified chronic kidney disease: Secondary | ICD-10-CM | POA: Diagnosis not present

## 2021-08-02 DIAGNOSIS — I5032 Chronic diastolic (congestive) heart failure: Secondary | ICD-10-CM | POA: Diagnosis not present

## 2021-08-02 DIAGNOSIS — R809 Proteinuria, unspecified: Secondary | ICD-10-CM | POA: Diagnosis not present

## 2021-08-02 DIAGNOSIS — N189 Chronic kidney disease, unspecified: Secondary | ICD-10-CM | POA: Diagnosis not present

## 2021-08-02 DIAGNOSIS — E211 Secondary hyperparathyroidism, not elsewhere classified: Secondary | ICD-10-CM | POA: Diagnosis not present

## 2021-08-02 LAB — COMPLETE METABOLIC PANEL WITH GFR
AG Ratio: 1.5 (calc) (ref 1.0–2.5)
ALT: 36 U/L (ref 9–46)
AST: 35 U/L (ref 10–35)
Albumin: 3.9 g/dL (ref 3.6–5.1)
Alkaline phosphatase (APISO): 90 U/L (ref 35–144)
BUN/Creatinine Ratio: 12 (calc) (ref 6–22)
BUN: 35 mg/dL — ABNORMAL HIGH (ref 7–25)
CO2: 24 mmol/L (ref 20–32)
Calcium: 9 mg/dL (ref 8.6–10.3)
Chloride: 105 mmol/L (ref 98–110)
Creat: 2.88 mg/dL — ABNORMAL HIGH (ref 0.70–1.28)
Globulin: 2.6 g/dL (calc) (ref 1.9–3.7)
Glucose, Bld: 93 mg/dL (ref 65–139)
Potassium: 5.1 mmol/L (ref 3.5–5.3)
Sodium: 139 mmol/L (ref 135–146)
Total Bilirubin: 0.4 mg/dL (ref 0.2–1.2)
Total Protein: 6.5 g/dL (ref 6.1–8.1)
eGFR: 22 mL/min/{1.73_m2} — ABNORMAL LOW (ref >=60)

## 2021-08-02 LAB — CBC WITH DIFFERENTIAL/PLATELET
Absolute Monocytes: 666 cells/uL (ref 200–950)
Basophils Absolute: 42 cells/uL (ref 0–200)
Basophils Relative: 0.7 %
Eosinophils Absolute: 288 cells/uL (ref 15–500)
Eosinophils Relative: 4.8 %
HCT: 40.8 % (ref 38.5–50.0)
Hemoglobin: 12.7 g/dL — ABNORMAL LOW (ref 13.2–17.1)
Lymphs Abs: 894 cells/uL (ref 850–3900)
MCH: 27.5 pg (ref 27.0–33.0)
MCHC: 31.1 g/dL — ABNORMAL LOW (ref 32.0–36.0)
MCV: 88.5 fL (ref 80.0–100.0)
MPV: 11.8 fL (ref 7.5–12.5)
Monocytes Relative: 11.1 %
Neutro Abs: 4110 cells/uL (ref 1500–7800)
Neutrophils Relative %: 68.5 %
Platelets: 179 10*3/uL (ref 140–400)
RBC: 4.61 10*6/uL (ref 4.20–5.80)
RDW: 13.1 % (ref 11.0–15.0)
Total Lymphocyte: 14.9 %
WBC: 6 10*3/uL (ref 3.8–10.8)

## 2021-08-03 NOTE — Progress Notes (Signed)
Hemoglobin is low and stable.  Creatinine is elevated with GFR 22.  Please forward labs to his nephrologist.

## 2021-08-08 DIAGNOSIS — N189 Chronic kidney disease, unspecified: Secondary | ICD-10-CM | POA: Diagnosis not present

## 2021-08-08 DIAGNOSIS — D631 Anemia in chronic kidney disease: Secondary | ICD-10-CM | POA: Diagnosis not present

## 2021-08-08 DIAGNOSIS — I5032 Chronic diastolic (congestive) heart failure: Secondary | ICD-10-CM | POA: Diagnosis not present

## 2021-08-08 DIAGNOSIS — R809 Proteinuria, unspecified: Secondary | ICD-10-CM | POA: Diagnosis not present

## 2021-08-08 DIAGNOSIS — I129 Hypertensive chronic kidney disease with stage 1 through stage 4 chronic kidney disease, or unspecified chronic kidney disease: Secondary | ICD-10-CM | POA: Diagnosis not present

## 2021-08-08 DIAGNOSIS — N184 Chronic kidney disease, stage 4 (severe): Secondary | ICD-10-CM | POA: Diagnosis not present

## 2021-08-27 ENCOUNTER — Ambulatory Visit (INDEPENDENT_AMBULATORY_CARE_PROVIDER_SITE_OTHER): Payer: Medicare HMO | Admitting: Nurse Practitioner

## 2021-08-29 ENCOUNTER — Encounter (INDEPENDENT_AMBULATORY_CARE_PROVIDER_SITE_OTHER): Payer: Medicare HMO | Admitting: Nurse Practitioner

## 2021-09-04 ENCOUNTER — Other Ambulatory Visit (INDEPENDENT_AMBULATORY_CARE_PROVIDER_SITE_OTHER): Payer: Self-pay | Admitting: Nurse Practitioner

## 2021-09-05 ENCOUNTER — Other Ambulatory Visit: Payer: Self-pay | Admitting: Family Medicine

## 2021-09-06 ENCOUNTER — Other Ambulatory Visit: Payer: Self-pay | Admitting: Physician Assistant

## 2021-09-06 NOTE — Telephone Encounter (Signed)
Next Visit: 11/13/2021  Last Visit: 06/11/2021  Last Fill: 06/11/2021  DX: Idiopathic chronic gout of multiple sites without tophus  Current Dose per office note 06/11/2021: allopurinol 50mg  by mouth every other day.  Labs: 08/02/2021 Hemoglobin is low and stable.  Creatinine is elevated with GFR 22 Uric Acid 5.6   Okay to refill Allopurinol?

## 2021-09-12 ENCOUNTER — Other Ambulatory Visit: Payer: Self-pay

## 2021-09-12 ENCOUNTER — Encounter: Payer: Self-pay | Admitting: Internal Medicine

## 2021-09-12 ENCOUNTER — Ambulatory Visit (INDEPENDENT_AMBULATORY_CARE_PROVIDER_SITE_OTHER): Payer: Medicare HMO | Admitting: Internal Medicine

## 2021-09-12 ENCOUNTER — Encounter (INDEPENDENT_AMBULATORY_CARE_PROVIDER_SITE_OTHER): Payer: Self-pay

## 2021-09-12 VITALS — BP 131/61 | HR 75 | Temp 97.8°F | Resp 18 | Ht 70.0 in | Wt 183.1 lb

## 2021-09-12 DIAGNOSIS — Z7689 Persons encountering health services in other specified circumstances: Secondary | ICD-10-CM

## 2021-09-12 DIAGNOSIS — I2510 Atherosclerotic heart disease of native coronary artery without angina pectoris: Secondary | ICD-10-CM | POA: Diagnosis not present

## 2021-09-12 DIAGNOSIS — Z8546 Personal history of malignant neoplasm of prostate: Secondary | ICD-10-CM | POA: Diagnosis not present

## 2021-09-12 DIAGNOSIS — Z8739 Personal history of other diseases of the musculoskeletal system and connective tissue: Secondary | ICD-10-CM | POA: Diagnosis not present

## 2021-09-12 DIAGNOSIS — I10 Essential (primary) hypertension: Secondary | ICD-10-CM | POA: Diagnosis not present

## 2021-09-12 DIAGNOSIS — L239 Allergic contact dermatitis, unspecified cause: Secondary | ICD-10-CM | POA: Diagnosis not present

## 2021-09-12 DIAGNOSIS — N184 Chronic kidney disease, stage 4 (severe): Secondary | ICD-10-CM | POA: Diagnosis not present

## 2021-09-12 DIAGNOSIS — E782 Mixed hyperlipidemia: Secondary | ICD-10-CM

## 2021-09-12 DIAGNOSIS — M0600 Rheumatoid arthritis without rheumatoid factor, unspecified site: Secondary | ICD-10-CM | POA: Diagnosis not present

## 2021-09-12 NOTE — Progress Notes (Signed)
New Patient Office Visit  Subjective:  Patient ID: Jacob Farmer, male    DOB: 12-Nov-1946  Age: 75 y.o. MRN: [id number]  CC:  Chief Complaint  Patient presents with   New Patient (Initial Visit)    New patient was seeing dr Anastasio Champion    HPI Jacob Farmer is a 75 year old male with PMH of CAD status post stent placement, HTN, HLD, RA, CKD stage IV, prostate ca. s/p radiotherapy, gout, cervical spinal stenosis and GERD who presents for establishing care.  CAD s/p stent placement and HTN: BP is well-controlled. Takes medications regularly. Patient denies headache, dizziness, chest pain, dyspnea or palpitations.  He follows up with cardiology.  He is on aspirin and statin.  RA: He is on [place] for it. He goes to rheumatology clinic.  He also takes allopurinol for history of gout, denies any recent flareups.  CKD D stage IV: He is on losartan.  He follows up with nephrology for it.  Denies any dysuria or hematuria currently.  He has a history of allergic dermatitis, for which he takes [place] and sees Dr. Denna Haggard.  He is up to date with COVID-vaccine.    Past Medical History:  Diagnosis Date   CAD (coronary artery disease)    Acute myocardial infarction treated with TPA in 1990; 1999-stents to circumflex and RCA; residual total occlusion of the left anterior descending; normal ejection fraction; stress nuclear in 2001-distal anteroseptal ischemia; normal LV function   CKD (chronic kidney disease) stage 4, GFR 15-29 ml/min (HCC)    First degree heart block    History of gastric ulcer    History of kidney stones    History of MI (myocardial infarction)    1990-  treated w/ TPA   Hyperlipidemia    Hypertension    Jaundice    OA (osteoarthritis)    Prediabetes    Prostate cancer (HCC)    Stage T1c , Gleason 3+4,  PSA 4.7,  vol 24cc--  scheduled for radiative seed implants   Psoriatic arthritis ([place])    RA (rheumatoid arthritis) ([place])    Dr. Toni Amend   Sinus bradycardia     Wears dentures     Past Surgical History:  Procedure Laterality Date   BIOPSY  08/27/2019   Procedure: BIOPSY;  Surgeon: Rogene Houston, MD;  [facility];  Service: Endoscopy;;  gastric   CARDIOVASCULAR STRESS TEST  2001  per Dr Lattie Haw clinic note   distal anteroseptal ischemia,  normal LVF   COLONOSCOPY  last one 2006 (approx)   CORONARY ANGIOPLASTY WITH STENT PLACEMENT  1999   DES x2  to CFX and RCA/  residual total occlusion LAD,  normal LVEF   ESOPHAGOGASTRODUODENOSCOPY (EGD) WITH PROPOFOL N/A 08/27/2019   Procedure: ESOPHAGOGASTRODUODENOSCOPY (EGD) WITH PROPOFOL;  Surgeon: Rogene Houston, MD;  Location: [facility];  Service: Endoscopy;  Laterality: N/A;  10:10   EYE SURGERY Bilateral    cataract   POSTERIOR CERVICAL FUSION/FORAMINOTOMY N/A 05/26/2017   Procedure: RIGHT C4-5 FORAMINOTOMY WITH EXCISION OF HERNIATED [place];  Surgeon: Jessy Oto, MD;  Location: [place];  Service: Orthopedics;  Laterality: N/A;   RADIOACTIVE SEED IMPLANT N/A 01/11/2016   Procedure: RADIOACTIVE SEED IMPLANT/BRACHYTHERAPY IMPLANT;  Surgeon: Franchot Gallo, MD;  [facility]: [facility];  Service: Urology;  Laterality: N/A;   25  seeds implanted no seeds founds in bladder    Family History  Problem Relation Age of Onset   Heart attack Mother  Coronary artery disease Father    Ulcerative colitis Father    Ulcers Father    Breast cancer Sister    COPD Brother    Pancreatic cancer Brother    Healthy Sister    Diabetes Brother    Cirrhosis Son    Seizures Son     Social History   Socioeconomic History   Marital status: Married    Spouse name: Not on file   Number of children: 2   Years of education: Not on file   Highest education level: Not on file  Occupational History   Occupation: Retired Clinical biochemist  Tobacco Use   Smoking status: Former    Packs/day: 1.00    Years: 30.00    Pack years: 30.00    Types: Cigarettes    Quit date: 05/21/1989    Years  since quitting: 32.3   Smokeless tobacco: Never  Vaping Use   Vaping Use: Never used  Substance and Sexual Activity   Alcohol use: Yes    Alcohol/week: 20.0 standard drinks    Types: 10 Shots of liquor, 10 Standard drinks or equivalent per week    Comment: 1 beer per day   Drug use: No   Sexual activity: Not on file  Other Topics Concern   Not on file  Social History Narrative   Married for 24 years.Retired Clinical biochemist.   Social Determinants of Health   Financial Resource Strain: Not on file  Food Insecurity: Not on file  Transportation Needs: Not on file  Physical Activity: Not on file  Stress: Not on file  Social Connections: Not on file  Intimate Partner Violence: Not on file    ROS Review of Systems  Constitutional:  Negative for chills and fever.  HENT:  Negative for congestion and sore throat.   Eyes:  Negative for pain and discharge.  Respiratory:  Negative for cough and shortness of breath.   Cardiovascular:  Negative for chest pain and palpitations.  Gastrointestinal:  Negative for constipation, diarrhea, nausea and vomiting.  Endocrine: Negative for polydipsia and polyuria.  Genitourinary:  Negative for dysuria and hematuria.  Musculoskeletal:  Positive for arthralgias and back pain. Negative for neck pain and neck stiffness.  Skin:  Positive for rash.  Neurological:  Negative for dizziness, weakness, numbness and headaches.  Psychiatric/Behavioral:  Negative for agitation and behavioral problems.    Objective:   Today's Vitals: BP 131/61 (BP Location: Left Arm, Patient Position: Sitting, Cuff Size: Normal)   Pulse 75   Temp 97.8 F (36.6 C) (Oral)   Resp 18   Ht 5' 10"  (1.778 m)   Wt 183 lb 1.9 oz (83.1 kg)   SpO2 96%   BMI 26.27 kg/m   Physical Exam Vitals reviewed.  Constitutional:      General: He is not in acute distress.    Appearance: He is not diaphoretic.  HENT:     Head: Normocephalic and atraumatic.     Nose: Nose normal.      Mouth/Throat:     Mouth: Mucous membranes are moist.  Eyes:     General: No scleral icterus.    Extraocular Movements: Extraocular movements intact.  Cardiovascular:     Rate and Rhythm: Normal rate and regular rhythm.     Pulses: Normal pulses.     Heart sounds: Normal heart sounds. No murmur heard. Pulmonary:     Breath sounds: Normal breath sounds. No wheezing or rales.  Abdominal:     Palpations: Abdomen is soft.  Tenderness: There is no abdominal tenderness.  Musculoskeletal:     Cervical back: Neck supple. No tenderness.     Right lower leg: No edema.     Left lower leg: No edema.  Skin:    General: Skin is warm.     Findings: Rash (Diffuse eczematous and dry, scaly patches over LE) present.  Neurological:     General: No focal deficit present.     Mental Status: He is alert and oriented to person, place, and time.     Sensory: No sensory deficit.     Motor: No weakness.  Psychiatric:        Mood and Affect: Mood normal.        Behavior: Behavior normal.    Assessment & Plan:   Problem List Items Addressed This Visit       Encounter to establish care - Primary Care established Previous chart reviewed History and medications reviewed with the patient   Cardiovascular and Mediastinum   Essential hypertension    BP Readings from Last 1 Encounters:  09/12/21 131/61  Well-controlled with Amlodipine, Losartan and hydralazine Counseled for compliance with the medications Advised DASH diet and moderate exercise/walking as tolerated      Coronary artery disease involving native coronary artery of native heart without angina pectoris    S/p stent placement in 1999 On aspirin and statin Followed by Cardiology        Musculoskeletal and Integument   Rheumatoid arthritis ([place])    On Arava Followed by Rheumatology      Allergic dermatitis    On Dupixent and as needed Kenalog cream Followed by Dr. Denna Haggard        Genitourinary   Chronic kidney disease,  stage 4 (severe) ([place])    On losartan Avoid nephrotoxic agents including NSAIDs Followed by nephrology        Other   Hyperlipidemia    On Crestor Check lipid profile      History of prostate cancer    S/p radiotherapy Check PSA      Relevant Orders   PSA   History of gout    On allopurinol No recent flares          Outpatient Encounter Medications as of 09/12/2021  Medication Sig   Alcohol Swabs (DROPSAFE ALCOHOL PREP) 70 % PADS USE AS NEEDED TO CLEAN SKIN PRIOR TO FINGERPRICK AND INJECTION   allopurinol (ZYLOPRIM) 100 MG tablet TAKE 1/2 TABLET EVERY OTHER DAY   amLODipine (NORVASC) 5 MG tablet Take 1 tablet (5 mg total) by mouth daily.   aspirin 81 MG tablet Take 1 tablet (81 mg total) by mouth 3 (three) times a week.   Blood Glucose Monitoring Suppl (ACCU-CHEK GUIDE) w/Device KIT 1 Units by Does not apply route 2 (two) times daily.   Cholecalciferol (VITAMIN D) 125 MCG (5000 UT) CAPS Take 5,000 Units by mouth daily.   Dupilumab (DUPIXENT) 300 MG/2ML SOPN Inject 1 application into the skin every 14 (fourteen) days.   folic acid (FOLVITE) 161 MCG tablet Take 400 mcg by mouth daily.   glucose blood (ACCU-CHEK GUIDE) test strip Use as instructed   Glucose Blood (BLOOD GLUCOSE TEST STRIPS) STRP Inject 100 Units into the skin 2 (two) times daily. Please provide the brand name Contour test strips Diagnosis: E 11.9.   hydrALAZINE (APRESOLINE) 25 MG tablet Take 50 mg by mouth 2 (two) times daily.   Lancets (ACCU-CHEK SOFT TOUCH) lancets Use as instructed   leflunomide (ARAVA) 10 MG  tablet Take 1 tablet (10 mg total) by mouth daily.   losartan (COZAAR) 25 MG tablet Take 0.5 tablets (12.5 mg total) by mouth daily.   nitroGLYCERIN (NITROSTAT) 0.4 MG SL tablet Place 0.4 mg under the tongue every 5 (five) minutes as needed for chest pain.    rosuvastatin (CRESTOR) 40 MG tablet TAKE 1 TABLET EVERY DAY . STOP ATORVASTATIN   triamcinolone cream (KENALOG) 0.1 % Apply 1 application  topically daily as needed (rash).   [DISCONTINUED] Colchicine 0.6 MG CAPS Take 0.6 mg by mouth as needed. (Patient not taking: Reported on 09/12/2021)   No facility-administered encounter medications on file as of 09/12/2021.    Follow-up: Return in about 4 months (around 01/12/2022) for Annual physical.   Lindell Spar, MD

## 2021-09-12 NOTE — Assessment & Plan Note (Addendum)
On allopurinol.  No recent flares.  ?

## 2021-09-12 NOTE — Assessment & Plan Note (Signed)
S/p radiotherapy Check PSA 

## 2021-09-12 NOTE — Assessment & Plan Note (Signed)
S/p stent placement in 1999 On aspirin and statin Followed by Cardiology 

## 2021-09-12 NOTE — Assessment & Plan Note (Signed)
On losartan °Avoid nephrotoxic agents including NSAIDs °Followed by nephrology °

## 2021-09-12 NOTE — Patient Instructions (Addendum)
Please continue to take medications as prescribed.  Please continue to follow low salt diet and ambulate as tolerated.  Please get blood test done at next blood draw for Nephrologist.

## 2021-09-12 NOTE — Assessment & Plan Note (Signed)
BP Readings from Last 1 Encounters:  09/12/21 131/61   Well-controlled with Amlodipine, Losartan and hydralazine Counseled for compliance with the medications Advised DASH diet and moderate exercise/walking as tolerated

## 2021-09-12 NOTE — Assessment & Plan Note (Signed)
On Arava Followed by Rheumatology 

## 2021-09-12 NOTE — Assessment & Plan Note (Signed)
On Crestor Check lipid profile 

## 2021-09-12 NOTE — Assessment & Plan Note (Signed)
On Dupixent and as needed Kenalog cream Followed by Dr. Denna Haggard

## 2021-09-14 ENCOUNTER — Other Ambulatory Visit: Payer: Self-pay

## 2021-09-14 ENCOUNTER — Ambulatory Visit (INDEPENDENT_AMBULATORY_CARE_PROVIDER_SITE_OTHER): Payer: Medicare HMO | Admitting: *Deleted

## 2021-09-14 DIAGNOSIS — Z0000 Encounter for general adult medical examination without abnormal findings: Secondary | ICD-10-CM

## 2021-09-14 NOTE — Patient Instructions (Signed)
Jacob Farmer , Thank you for taking time to come for your Medicare Wellness Visit. I appreciate your ongoing commitment to your health goals. Please review the following plan we discussed and let me know if I can assist you in the future.   Screening recommendations/referrals:  Recommended yearly ophthalmology/optometry visit for glaucoma screening and checkup Recommended yearly dental visit for hygiene and checkup  Vaccinations: Influenza vaccine: completed Pneumococcal vaccine: completed Tdap vaccine: due now  Shingles vaccine: due now     Advanced directives: information provided  Conditions/risks identified: hypertension  Next appointment: 1 year   Preventive Care 77 Years and Older, Male Preventive care refers to lifestyle choices and visits with your health care provider that can promote health and wellness. What does preventive care include? A yearly physical exam. This is also called an annual well check. Dental exams once or twice a year. Routine eye exams. Ask your health care provider how often you should have your eyes checked. Personal lifestyle choices, including: Daily care of your teeth and gums. Regular physical activity. Eating a healthy diet. Avoiding tobacco and drug use. Limiting alcohol use. Practicing safe sex. Taking low doses of aspirin every day. Taking vitamin and mineral supplements as recommended by your health care provider. What happens during an annual well check? The services and screenings done by your health care provider during your annual well check will depend on your age, overall health, lifestyle risk factors, and family history of disease. Counseling  Your health care provider may ask you questions about your: Alcohol use. Tobacco use. Drug use. Emotional well-being. Home and relationship well-being. Sexual activity. Eating habits. History of falls. Memory and ability to understand (cognition). Work and work  Statistician. Screening  You may have the following tests or measurements: Height, weight, and BMI. Blood pressure. Lipid and cholesterol levels. These may be checked every 5 years, or more frequently if you are over 77 years old. Skin check. Lung cancer screening. You may have this screening every year starting at age 77 if you have a 30-pack-year history of smoking and currently smoke or have quit within the past 15 years. Fecal occult blood test (FOBT) of the stool. You may have this test every year starting at age 77. Flexible sigmoidoscopy or colonoscopy. You may have a sigmoidoscopy every 5 years or a colonoscopy every 10 years starting at age 77. Prostate cancer screening. Recommendations will vary depending on your family history and other risks. Hepatitis C blood test. Hepatitis B blood test. Sexually transmitted disease (STD) testing. Diabetes screening. This is done by checking your blood sugar (glucose) after you have not eaten for a while (fasting). You may have this done every 1-3 years. Abdominal aortic aneurysm (AAA) screening. You may need this if you are a current or former smoker. Osteoporosis. You may be screened starting at age 77 if you are at high risk. Talk with your health care provider about your test results, treatment options, and if necessary, the need for more tests. Vaccines  Your health care provider may recommend certain vaccines, such as: Influenza vaccine. This is recommended every year. Tetanus, diphtheria, and acellular pertussis (Tdap, Td) vaccine. You may need a Td booster every 10 years. Zoster vaccine. You may need this after age 6. Pneumococcal 13-valent conjugate (PCV13) vaccine. One dose is recommended after age 77. Pneumococcal polysaccharide (PPSV23) vaccine. One dose is recommended after age 77. Talk to your health care provider about which screenings and vaccines you need and how often you need  them. This information is not intended to replace  advice given to you by your health care provider. Make sure you discuss any questions you have with your health care provider. Document Released: 12/29/2015 Document Revised: 08/21/2016 Document Reviewed: 10/03/2015 Elsevier Interactive Patient Education  2017 [place] Prevention in the Home Falls can cause injuries. They can happen to people of all ages. There are many things you can do to make your home safe and to help prevent falls. What can I do on the outside of my home? Regularly fix the edges of walkways and driveways and fix any cracks. Remove anything that might make you trip as you walk through a door, such as a raised step or threshold. Trim any bushes or trees on the path to your home. Use bright outdoor lighting. Clear any walking paths of anything that might make someone trip, such as rocks or tools. Regularly check to see if handrails are loose or broken. Make sure that both sides of any steps have handrails. Any raised decks and porches should have guardrails on the edges. Have any leaves, snow, or ice cleared regularly. Use sand or salt on walking paths during winter. Clean up any spills in your garage right away. This includes oil or grease spills. What can I do in the bathroom? Use night lights. Install grab bars by the toilet and in the tub and shower. Do not use towel bars as grab bars. Use non-skid mats or decals in the tub or shower. If you need to sit down in the shower, use a plastic, non-slip stool. Keep the floor dry. Clean up any water that spills on the floor as soon as it happens. Remove soap buildup in the tub or shower regularly. Attach bath mats securely with double-sided non-slip rug tape. Do not have throw rugs and other things on the floor that can make you trip. What can I do in the bedroom? Use night lights. Make sure that you have a light by your bed that is easy to reach. Do not use any sheets or blankets that are too big for your bed.  They should not hang down onto the floor. Have a firm chair that has side arms. You can use this for support while you get dressed. Do not have throw rugs and other things on the floor that can make you trip. What can I do in the kitchen? Clean up any spills right away. Avoid walking on wet floors. Keep items that you use a lot in easy-to-reach places. If you need to reach something above you, use a strong step stool that has a grab bar. Keep electrical cords out of the way. Do not use floor polish or wax that makes floors slippery. If you must use wax, use non-skid floor wax. Do not have throw rugs and other things on the floor that can make you trip. What can I do with my stairs? Do not leave any items on the stairs. Make sure that there are handrails on both sides of the stairs and use them. Fix handrails that are broken or loose. Make sure that handrails are as long as the stairways. Check any carpeting to make sure that it is firmly attached to the stairs. Fix any carpet that is loose or worn. Avoid having throw rugs at the top or bottom of the stairs. If you do have throw rugs, attach them to the floor with carpet tape. Make sure that you have a light switch at  the top of the stairs and the bottom of the stairs. If you do not have them, ask someone to add them for you. What else can I do to help prevent falls? Wear shoes that: Do not have high heels. Have rubber bottoms. Are comfortable and fit you well. Are closed at the toe. Do not wear sandals. If you use a stepladder: Make sure that it is fully opened. Do not climb a closed stepladder. Make sure that both sides of the stepladder are locked into place. Ask someone to hold it for you, if possible. Clearly mark and make sure that you can see: Any grab bars or handrails. First and last steps. Where the edge of each step is. Use tools that help you move around (mobility aids) if they are needed. These  include: Canes. Walkers. Scooters. Crutches. Turn on the lights when you go into a dark area. Replace any light bulbs as soon as they burn out. Set up your furniture so you have a clear path. Avoid moving your furniture around. If any of your floors are uneven, fix them. If there are any pets around you, be aware of where they are. Review your medicines with your doctor. Some medicines can make you feel dizzy. This can increase your chance of falling. Ask your doctor what other things that you can do to help prevent falls. This information is not intended to replace advice given to you by your health care provider. Make sure you discuss any questions you have with your health care provider. Document Released: 09/28/2009 Document Revised: 05/09/2016 Document Reviewed: 01/06/2015 Elsevier Interactive Patient Education  2017 [employer].

## 2021-09-14 NOTE — Progress Notes (Signed)
Subjective:   Jacob Farmer is a 75 y.o. male who presents for Medicare Annual/Subsequent preventive examination.  I connected with  [name] on 09/14/21 by an audio enabled telemedicine application and verified that I am speaking with the correct person using two identifiers.   I discussed the limitations, risks, security and privacy concerns of performing an evaluation and management service by telephone and the availability of in person appointments. I also discussed with the patient that there may be a patient responsible charge related to this service. The patient expressed understanding and verbally consented to this telephonic visit.   Review of Systems           Objective:    There were no vitals filed for this visit. There is no height or weight on file to calculate BMI.  Advanced Directives 01/03/2021 08/25/2020 08/27/2019 08/24/2019 05/26/2017 09/06/2016 08/29/2016  Does Patient Have a Medical Advance Directive? Yes No Yes Yes Yes Yes Yes  Type of Estate agent of [place];Living will - Living will Living will Healthcare Power of [place];Living will Living will Living will  Does patient want to make changes to medical advance directive? - - - No - Patient declined - No - Patient declined No - Patient declined  Copy of Healthcare Power of Attorney in Chart? No - copy requested - - - Yes No - copy requested No - copy requested    Current Medications (verified) Outpatient Encounter Medications as of 09/14/2021  Medication Sig   Alcohol Swabs (DROPSAFE ALCOHOL PREP) 70 % PADS USE AS NEEDED TO CLEAN SKIN PRIOR TO FINGERPRICK AND INJECTION   allopurinol (ZYLOPRIM) 100 MG tablet TAKE 1/2 TABLET EVERY OTHER DAY   amLODipine (NORVASC) 5 MG tablet Take 1 tablet (5 mg total) by mouth daily.   aspirin 81 MG tablet Take 1 tablet (81 mg total) by mouth 3 (three) times a week.   Blood Glucose Monitoring Suppl (ACCU-CHEK GUIDE) w/Device KIT 1 Units by Does not apply  route 2 (two) times daily.   Cholecalciferol (VITAMIN D) 125 MCG (5000 UT) CAPS Take 5,000 Units by mouth daily.   Dupilumab (DUPIXENT) 300 MG/2ML SOPN Inject 1 application into the skin every 14 (fourteen) days.   folic acid (FOLVITE) 400 MCG tablet Take 400 mcg by mouth daily.   glucose blood (ACCU-CHEK GUIDE) test strip Use as instructed   Glucose Blood (BLOOD GLUCOSE TEST STRIPS) STRP Inject 100 Units into the skin 2 (two) times daily. Please provide the brand name Contour test strips Diagnosis: E 11.9.   hydrALAZINE (APRESOLINE) 25 MG tablet Take 50 mg by mouth 2 (two) times daily.   Lancets (ACCU-CHEK SOFT TOUCH) lancets Use as instructed   leflunomide (ARAVA) 10 MG tablet Take 1 tablet (10 mg total) by mouth daily.   losartan (COZAAR) 25 MG tablet Take 0.5 tablets (12.5 mg total) by mouth daily.   nitroGLYCERIN (NITROSTAT) 0.4 MG SL tablet Place 0.4 mg under the tongue every 5 (five) minutes as needed for chest pain.    rosuvastatin (CRESTOR) 40 MG tablet TAKE 1 TABLET EVERY DAY . STOP ATORVASTATIN   triamcinolone cream (KENALOG) 0.1 % Apply 1 application topically daily as needed (rash).   No facility-administered encounter medications on file as of 09/14/2021.    Allergies (verified) Patient has no known allergies.   History: Past Medical History:  Diagnosis Date   CAD (coronary artery disease)    Acute myocardial infarction treated with TPA in 1990; 1999-stents to circumflex and  RCA; residual total occlusion of the left anterior descending; normal ejection fraction; stress nuclear in 2001-distal anteroseptal ischemia; normal LV function   CKD (chronic kidney disease) stage 4, GFR 15-29 ml/min (HCC)    First degree heart block    History of gastric ulcer    History of kidney stones    History of MI (myocardial infarction)    1990-  treated w/ TPA   Hyperlipidemia    Hypertension    Jaundice    OA (osteoarthritis)    Prediabetes    Prostate cancer (HCC)    Stage T1c ,  Gleason 3+4,  PSA 4.7,  vol 24cc--  scheduled for radiative seed implants   Psoriatic arthritis ([place])    RA (rheumatoid arthritis) ([place])    Dr. Toni Amend   Sinus bradycardia    Wears dentures    Past Surgical History:  Procedure Laterality Date   BIOPSY  08/27/2019   Procedure: BIOPSY;  Surgeon: Rogene Houston, MD;  [facility];  Service: Endoscopy;;  gastric   CARDIOVASCULAR STRESS TEST  2001  per Dr Lattie Haw clinic note   distal anteroseptal ischemia,  normal LVF   COLONOSCOPY  last one 2006 (approx)   CORONARY ANGIOPLASTY WITH STENT PLACEMENT  1999   DES x2  to CFX and RCA/  residual total occlusion LAD,  normal LVEF   ESOPHAGOGASTRODUODENOSCOPY (EGD) WITH PROPOFOL N/A 08/27/2019   Procedure: ESOPHAGOGASTRODUODENOSCOPY (EGD) WITH PROPOFOL;  Surgeon: Rogene Houston, MD;  [facility];  Service: Endoscopy;  Laterality: N/A;  10:10   EYE SURGERY Bilateral    cataract   POSTERIOR CERVICAL FUSION/FORAMINOTOMY N/A 05/26/2017   Procedure: RIGHT C4-5 FORAMINOTOMY WITH EXCISION OF HERNIATED [place];  Surgeon: Jessy Oto, MD;  Location: [facility];  Service: Orthopedics;  Laterality: N/A;   RADIOACTIVE SEED IMPLANT N/A 01/11/2016   Procedure: RADIOACTIVE SEED IMPLANT/BRACHYTHERAPY IMPLANT;  Surgeon: Franchot Gallo, MD;  Location: [facility];  Service: Urology;  Laterality: N/A;   31  seeds implanted no seeds founds in bladder   Family History  Problem Relation Age of Onset   Heart attack Mother    Coronary artery disease Father    Ulcerative colitis Father    Ulcers Father    Breast cancer Sister    COPD Brother    Pancreatic cancer Brother    Healthy Sister    Diabetes Brother    Cirrhosis Son    Seizures Son    Social History   Socioeconomic History   Marital status: Married    Spouse name: Not on file   Number of children: 2   Years of education: Not on file   Highest education level: Not on file  Occupational History   Occupation:  Retired Clinical biochemist  Tobacco Use   Smoking status: Former    Packs/day: 1.00    Years: 30.00    Pack years: 30.00    Types: Cigarettes    Quit date: 05/21/1989    Years since quitting: 32.3   Smokeless tobacco: Never  Vaping Use   Vaping Use: Never used  Substance and Sexual Activity   Alcohol use: Yes    Alcohol/week: 20.0 standard drinks    Types: 10 Shots of liquor, 10 Standard drinks or equivalent per week    Comment: 1 beer per day   Drug use: No   Sexual activity: Not on file  Other Topics Concern   Not on file  Social History Narrative   Married for  24 years.Retired Clinical biochemist.   Social Determinants of Health   Financial Resource Strain: Not on file  Food Insecurity: Not on file  Transportation Needs: Not on file  Physical Activity: Not on file  Stress: Not on file  Social Connections: Not on file    Tobacco Counseling Counseling given: Not Answered   Clinical Intake:                 Diabetic?Nutrition Risk Assessment:  Has the patient had any N/V/D within the last 2 months?  No  Does the patient have any non-healing wounds?  No  Has the patient had any unintentional weight loss or weight gain?  No   Diabetes:  Is the patient diabetic?  Yes  If diabetic, was a CBG obtained today?  No  Did the patient bring in their glucometer from home?  No  How often do you monitor your CBG's? Doesn't check.   Financial Strains and Diabetes Management:  Are you having any financial strains with the device, your supplies or your medication? No .  Does the patient want to be seen by Chronic Care Management for management of their diabetes?  No  Would the patient like to be referred to a Nutritionist or for Diabetic Management?  No   Diabetic Exams:  Diabetic Eye Exam: Overdue for diabetic eye exam. Pt has been advised about the importance in completing this exam. A referral has been placed today. Message sent to referral coordinator for scheduling purposes.  Advised pt to expect a call from office referred to regarding appt.  Diabetic Foot Exam: Pt has been advised about the importance in completing this exam. Pt is scheduled for diabetic foot exam on next appointment.           Activities of Daily Living In your present state of health, do you have any difficulty performing the following activities: 01/03/2021  Hearing? Y  Vision? N  Difficulty concentrating or making decisions? N  Walking or climbing stairs? N  Dressing or bathing? N  Doing errands, shopping? N  Some recent data might be hidden    Patient Care Team: Lindell Spar, MD as PCP - General (Internal Medicine) Harl Bowie Alphonse Guild, MD as PCP - Cardiology (Cardiology) Harl Bowie Alphonse Guild, MD as Consulting Physician (Cardiology) Lavonna Monarch, MD as Consulting Physician (Dermatology)  Indicate any recent Medical Services you may have received from other than [facility] providers in the past year (date may be approximate).     Assessment:   This is a routine wellness examination for Jar[place].  Hearing/Vision screen No results found.  Dietary issues and exercise activities discussed:     Goals Addressed   None   Depression Screen PHQ 2/9 Scores 09/12/2021 06/14/2021 03/14/2021 08/16/2020 08/16/2020 03/28/2020 09/28/2019  PHQ - 2 Score 0 0 0 0 0 0 0  PHQ- 9 Score - 0 0 - - - -  Exception Documentation - - - - Medical reason - -    Fall Risk Fall Risk  09/12/2021 06/14/2021 03/14/2021 08/16/2020 08/16/2020  Falls in the past year? 0 0 0 0 0  Number falls in past yr: 0 - - 0 0  Injury with Fall? 0 - - 0 0  Risk for fall due to : No Fall Risks - - - No Fall Risks  Follow up Falls evaluation completed - - Falls evaluation completed;Education provided;Falls prevention discussed Falls evaluation completed    FALL RISK PREVENTION PERTAINING TO THE HOME:  Any stairs in or  around the home? No  If so, are there any without handrails? No  Home free of loose throw rugs in walkways, pet  beds, electrical cords, etc? Yes  Adequate lighting in your home to reduce risk of falls? Yes   ASSISTIVE DEVICES UTILIZED TO PREVENT FALLS:  Life alert? No  Use of a cane, walker or w/c? No  Grab bars in the bathroom? Yes  Shower chair or bench in shower? Yes  Elevated toilet seat or a handicapped toilet? Yes   TIMED UP AND GO:  Was the test performed? No .  Length of time to ambulate 10 feet: NA sec.     Cognitive Function:     6CIT Screen 08/16/2020  What Year? 0 points  What month? 0 points  What time? 0 points  Count back from 20 0 points  Months in reverse 0 points  Repeat phrase 2 points  Total Score 2    Immunizations Immunization History  Administered Date(s) Administered   Influenza, High Dose Seasonal PF 09/12/2017, 08/28/2019, 08/28/2019   Influenza-Unspecified 08/08/2020, 08/24/2021   PFIZER(Purple Top)SARS-COV-2 Vaccination 01/22/2020, 02/12/2020, 08/15/2020, 01/19/2021   Pneumococcal Polysaccharide-23 08/16/2009   Zoster Recombinat (Shingrix) 07/29/2019    TDAP status: Due, Education has been provided regarding the importance of this vaccine. Advised may receive this vaccine at local pharmacy or Health Dept. Aware to provide a copy of the vaccination record if obtained from local pharmacy or Health Dept. Verbalized acceptance and understanding.  Flu Vaccine status: Up to date  Pneumococcal vaccine status: Up to date  Covid-19 vaccine status: Completed vaccines  Qualifies for Shingles Vaccine? Yes   Zostavax completed Yes   Shingrix Completed?: No.    Education has been provided regarding the importance of this vaccine. Patient has been advised to call insurance company to determine out of pocket expense if they have not yet received this vaccine. Advised may also receive vaccine at local pharmacy or Health Dept. Verbalized acceptance and understanding.  Screening Tests Health Maintenance  Topic Date Due   TETANUS/TDAP  Never done   Zoster  Vaccines- Shingrix (2 of 2) 09/23/2019   OPHTHALMOLOGY EXAM  06/22/2020   FOOT EXAM  08/31/2020   COVID-19 Vaccine (5 - Booster for Pfizer series) 05/19/2021   HEMOGLOBIN A1C  12/14/2021   INFLUENZA VACCINE  Completed   Hepatitis C Screening  Completed   HPV VACCINES  Aged Out    Health Maintenance  Health Maintenance Due  Topic Date Due   TETANUS/TDAP  Never done   Zoster Vaccines- Shingrix (2 of 2) 09/23/2019   OPHTHALMOLOGY EXAM  06/22/2020   FOOT EXAM  08/31/2020   COVID-19 Vaccine (5 - Booster for Pfizer series) 05/19/2021    Colorectal cancer screening: No longer required.   Lung Cancer Screening: (Low Dose CT Chest recommended if Age 52-80 years, 30 pack-year currently smoking OR have quit w/in 15years.) does not qualify.   Lung Cancer Screening Referral: NA  Additional Screening:  Hepatitis C Screening: does qualify; Completed 10-23-20  Vision Screening: Recommended annual ophthalmology exams for early detection of glaucoma and other disorders of the eye. Is the patient up to date with their annual eye exam?  No  Who is the provider or what is the name of the office in which the patient attends annual eye exams? Dr Jorja Loa  If pt is not established with a provider, would they like to be referred to a provider to establish care? No .   Dental Screening: Recommended annual dental  exams for proper oral hygiene  Community Resource Referral / Chronic Care Management: CRR required this visit?  No   CCM required this visit?  No      Plan:     I have personally reviewed and noted the following in the patient's chart:   Medical and social history Use of alcohol, tobacco or illicit drugs  Current medications and supplements including opioid prescriptions. Patient is not currently taking opioid prescriptions. Functional ability and status Nutritional status Physical activity Advanced directives List of other physicians Hospitalizations, surgeries, and ER visits in  previous 12 months Vitals Screenings to include cognitive, depression, and falls Referrals and appointments  In addition, I have reviewed and discussed with patient certain preventive protocols, quality metrics, and best practice recommendations. A written personalized care plan for preventive services as well as general preventive health recommendations were provided to patient.     Shelda Altes, CMA   09/14/2021   Nurse Notes: This was a telehealth visit. The patient was at home. The provider was in the office and was Ihor Dow, MD.

## 2021-09-27 ENCOUNTER — Encounter: Payer: Self-pay | Admitting: *Deleted

## 2021-10-05 ENCOUNTER — Other Ambulatory Visit: Payer: Self-pay | Admitting: Internal Medicine

## 2021-10-05 DIAGNOSIS — N189 Chronic kidney disease, unspecified: Secondary | ICD-10-CM | POA: Diagnosis not present

## 2021-10-05 DIAGNOSIS — R809 Proteinuria, unspecified: Secondary | ICD-10-CM | POA: Diagnosis not present

## 2021-10-05 DIAGNOSIS — I129 Hypertensive chronic kidney disease with stage 1 through stage 4 chronic kidney disease, or unspecified chronic kidney disease: Secondary | ICD-10-CM | POA: Diagnosis not present

## 2021-10-05 DIAGNOSIS — D631 Anemia in chronic kidney disease: Secondary | ICD-10-CM | POA: Diagnosis not present

## 2021-10-05 DIAGNOSIS — N184 Chronic kidney disease, stage 4 (severe): Secondary | ICD-10-CM | POA: Diagnosis not present

## 2021-10-05 DIAGNOSIS — Z8546 Personal history of malignant neoplasm of prostate: Secondary | ICD-10-CM | POA: Diagnosis not present

## 2021-10-05 DIAGNOSIS — I5032 Chronic diastolic (congestive) heart failure: Secondary | ICD-10-CM | POA: Diagnosis not present

## 2021-10-05 LAB — PSA: PSA: 0.69 ng/mL (ref <=4.00)

## 2021-10-10 DIAGNOSIS — I5032 Chronic diastolic (congestive) heart failure: Secondary | ICD-10-CM | POA: Diagnosis not present

## 2021-10-10 DIAGNOSIS — N184 Chronic kidney disease, stage 4 (severe): Secondary | ICD-10-CM | POA: Diagnosis not present

## 2021-10-10 DIAGNOSIS — R809 Proteinuria, unspecified: Secondary | ICD-10-CM | POA: Diagnosis not present

## 2021-10-10 DIAGNOSIS — D631 Anemia in chronic kidney disease: Secondary | ICD-10-CM | POA: Diagnosis not present

## 2021-10-10 DIAGNOSIS — N189 Chronic kidney disease, unspecified: Secondary | ICD-10-CM | POA: Diagnosis not present

## 2021-10-10 DIAGNOSIS — I129 Hypertensive chronic kidney disease with stage 1 through stage 4 chronic kidney disease, or unspecified chronic kidney disease: Secondary | ICD-10-CM | POA: Diagnosis not present

## 2021-10-10 DIAGNOSIS — E211 Secondary hyperparathyroidism, not elsewhere classified: Secondary | ICD-10-CM | POA: Diagnosis not present

## 2021-10-16 ENCOUNTER — Ambulatory Visit: Payer: Medicare HMO | Admitting: Dermatology

## 2021-10-16 ENCOUNTER — Other Ambulatory Visit: Payer: Self-pay

## 2021-10-16 ENCOUNTER — Encounter: Payer: Self-pay | Admitting: Dermatology

## 2021-10-16 DIAGNOSIS — L209 Atopic dermatitis, unspecified: Secondary | ICD-10-CM | POA: Diagnosis not present

## 2021-10-16 DIAGNOSIS — Z1283 Encounter for screening for malignant neoplasm of skin: Secondary | ICD-10-CM | POA: Diagnosis not present

## 2021-10-16 DIAGNOSIS — I872 Venous insufficiency (chronic) (peripheral): Secondary | ICD-10-CM | POA: Diagnosis not present

## 2021-10-29 ENCOUNTER — Telehealth: Payer: Self-pay

## 2021-10-29 DIAGNOSIS — Z79899 Other long term (current) drug therapy: Secondary | ICD-10-CM

## 2021-10-29 DIAGNOSIS — M1A09X0 Idiopathic chronic gout, multiple sites, without tophus (tophi): Secondary | ICD-10-CM

## 2021-10-29 NOTE — Telephone Encounter (Signed)
Lab Orders released.  

## 2021-10-29 NOTE — Telephone Encounter (Signed)
Patient called requesting his labwork orders be sent to [facility] in [place].  Patient plans to go on Thursday, 11/01/21.

## 2021-10-30 NOTE — Progress Notes (Signed)
Office Visit Note  Patient: Jacob Farmer             Date of Birth: 10-Feb-1946           MRN: [id number]             PCP: Lindell Spar, MD Referring: Doree Albee, MD Visit Date: 11/13/2021 Occupation: @GUAROCC @  Subjective:  Medication monitoring   History of Present Illness: Jacob Farmer is a 75 y.o. male with history of seronegative rheumatoid arthritis, osteoarthritis, gout, and DDD.  He is taking Arava 10 mg 1 tablet by mouth daily.  He is tolerating Arava without any side effects.  He denies missing any doses of Arava recently.  He denies any signs or symptoms of a rheumatoid arthritis flare.  He is not having any joint pain or joint swelling at this time.  He denies any signs or symptoms of a gout flare.  He remains on allopurinol 50 mg every other day.  He has not needed to take colchicine recently.  He would like a sample of colchicine to have on hand in case he has a flare.  He continues to follow-up closely with his nephrologist.  He has been avoiding use of NSAIDs.  He denies any lower back pain at this time. He denies any recent infections.       Activities of Daily Living:  Patient reports morning stiffness for 0 minutes.   Patient Denies nocturnal pain.  Difficulty dressing/grooming: Denies Difficulty climbing stairs: Denies Difficulty getting out of chair: Denies Difficulty using hands for taps, buttons, cutlery, and/or writing: Denies  Review of Systems  Constitutional:  Negative for fatigue.  HENT:  Negative for mouth sores, mouth dryness and nose dryness.   Eyes:  Negative for pain, itching and dryness.  Respiratory:  Negative for shortness of breath and difficulty breathing.   Cardiovascular:  Negative for chest pain and palpitations.  Gastrointestinal:  Negative for blood in stool, constipation and diarrhea.  Endocrine: Negative for increased urination.  Genitourinary:  Negative for difficulty urinating.  Musculoskeletal:  Negative for joint  pain, joint pain, joint swelling, myalgias, morning stiffness, muscle tenderness and myalgias.  Skin:  Negative for color change, rash and redness.  Allergic/Immunologic: Negative for susceptible to infections.  Neurological:  Negative for dizziness, numbness, headaches, memory loss and weakness.  Hematological:  Positive for bruising/bleeding tendency.  Psychiatric/Behavioral:  Negative for confusion.    PMFS History:  Patient Active Problem List   Diagnosis Date Noted   Allergic dermatitis 09/12/2021   Encounter to establish care 09/12/2021   History of gastric ulcer    Herniated cervical disc 05/26/2017    Class: Acute   Cervical spinal stenosis 05/26/2017   Primary osteoarthritis of both hands 02/04/2017   History of gout 02/04/2017   DDD (degenerative disc disease), lumbar 02/04/2017   High risk medication use 01/28/2017   History of prostate cancer 01/28/2017   Chronic kidney disease, stage 4 (severe) ([place]) 12/27/2011   Type 2 diabetes mellitus with hyperosmolarity without coma, without long-term current use of insulin ([place]) 12/27/2011   Rheumatoid arthritis ([place]) 01/04/2011   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 10/02/2010   SINUS BRADYCARDIA 01/12/2010   Coronary artery disease involving native coronary artery of native heart without angina pectoris 01/12/2010   Hyperlipidemia 01/08/2010   Essential hypertension 01/08/2010    Past Medical History:  Diagnosis Date   CAD (coronary artery disease)    Acute myocardial infarction treated  with TPA in [place]; 1999-stents to circumflex and RCA; residual total occlusion of the left anterior descending; normal ejection fraction; stress nuclear in 2001-distal anteroseptal ischemia; normal LV function   CKD (chronic kidney disease) stage 4, GFR 15-29 ml/min (HCC)    First degree heart block    History of gastric ulcer    History of kidney stones    History of MI (myocardial infarction)    1990-   treated w/ TPA   Hyperlipidemia    Hypertension    Jaundice    OA (osteoarthritis)    Prediabetes    Prostate cancer (HCC)    Stage T1c , Gleason 3+4,  PSA 4.7,  vol 24cc--  scheduled for radiative seed implants   Psoriatic arthritis ([place])    RA (rheumatoid arthritis) ([place])    Dr. Toni Amend   Sinus bradycardia    Wears dentures     Family History  Problem Relation Age of Onset   Heart attack Mother    Coronary artery disease Father    Ulcerative colitis Father    Ulcers Father    Breast cancer Sister    COPD Brother    Pancreatic cancer Brother    Healthy Sister    Diabetes Brother    Cirrhosis Son    Seizures Son    Past Surgical History:  Procedure Laterality Date   BIOPSY  08/27/2019   Procedure: BIOPSY;  Surgeon: Rogene Houston, MD;  [facility];  Service: Endoscopy;;  gastric   CARDIOVASCULAR STRESS TEST  2001  per Dr Lattie Haw clinic note   distal anteroseptal ischemia,  normal LVF   COLONOSCOPY  last one 2006 (approx)   CORONARY ANGIOPLASTY WITH STENT PLACEMENT  1999   DES x2  to CFX and RCA/  residual total occlusion LAD,  normal LVEF   ESOPHAGOGASTRODUODENOSCOPY (EGD) WITH PROPOFOL N/A 08/27/2019   Procedure: ESOPHAGOGASTRODUODENOSCOPY (EGD) WITH PROPOFOL;  Surgeon: Rogene Houston, MD;  [facility];  Service: Endoscopy;  Laterality: N/A;  10:10   EYE SURGERY Bilateral    cataract   POSTERIOR CERVICAL FUSION/FORAMINOTOMY N/A 05/26/2017   Procedure: RIGHT C4-5 FORAMINOTOMY WITH EXCISION OF HERNIATED [place];  Surgeon: Jessy Oto, MD;  Location: [facility];  Service: Orthopedics;  Laterality: N/A;   RADIOACTIVE SEED IMPLANT N/A 01/11/2016   Procedure: RADIOACTIVE SEED IMPLANT/BRACHYTHERAPY IMPLANT;  Surgeon: Franchot Gallo, MD;  Location: [facility];  Service: Urology;  Laterality: N/A;   73  seeds implanted no seeds founds in bladder   Social History   Social History Narrative   Married for 24 years.Retired Clinical biochemist.    Immunization History  Administered Date(s) Administered   Influenza, High Dose Seasonal PF 09/12/2017, 08/28/2019, 08/28/2019   Influenza-Unspecified 08/08/2020, 08/24/2021   PFIZER(Purple Top)SARS-COV-2 Vaccination 01/22/2020, 02/12/2020, 08/15/2020, 01/19/2021   Pneumococcal Polysaccharide-23 08/16/2009   Zoster Recombinat (Shingrix) 07/29/2019     Objective: Vital Signs: BP (!) 166/84 (BP Location: Left Arm, Patient Position: Sitting, Cuff Size: Normal)   Pulse 66   Ht 5\' 10"  (1.778 m)   Wt 186 lb (84.4 kg)   BMI 26.69 kg/m    Physical Exam Vitals and nursing note reviewed.  Constitutional:      Appearance: He is well-developed.  HENT:     Head: Normocephalic and atraumatic.  Eyes:     Conjunctiva/sclera: Conjunctivae normal.     Pupils: Pupils are equal, round, and reactive to light.  Pulmonary:     Effort: Pulmonary effort is normal.  Abdominal:     Palpations: Abdomen is soft.  Musculoskeletal:     Cervical back: Normal range of motion and neck supple.  Skin:    General: Skin is warm and dry.     Capillary Refill: Capillary refill takes less than 2 seconds.  Neurological:     Mental Status: He is alert and oriented to person, place, and time.  Psychiatric:        Behavior: Behavior normal.     Musculoskeletal Exam: C-spine, thoracic, and lumbar spine good ROM.  Shoulder joints, elbow joints, wrist joints, MCPs, PIPs, and DIPs good ROM with no synovitis.  Complete fist formation.  PIP and DIP thickening consistent with osteoarthritis of both hands.  Hip joints have good ROM with no groin pain.  Knee joints have good ROM with no warmth or effusion.  Ankle joints have good ROM with no tenderness or joint swelling.    CDAI Exam: CDAI Score: 0.2  Patient Global: 1 mm; Provider Global: 1 mm Swollen: 0 ; Tender: 0  Joint Exam 11/13/2021   No joint exam has been documented for this visit   There is currently no information documented on the homunculus. Go to the  Rheumatology activity and complete the homunculus joint exam.  Investigation: No additional findings.  Imaging: No results found.  Recent Labs: Lab Results  Component Value Date   WBC 5.0 11/01/2021   HGB 13.0 (L) 11/01/2021   PLT 166 11/01/2021   NA 140 11/01/2021   K 4.8 11/01/2021   CL 106 11/01/2021   CO2 26 11/01/2021   GLUCOSE 139 (H) 11/01/2021   BUN 29 (H) 11/01/2021   CREATININE 2.36 (H) 11/01/2021   BILITOT 0.4 11/01/2021   ALKPHOS 254 (H) 11/06/2020   AST 39 (H) 11/01/2021   ALT 36 11/01/2021   PROT 6.6 11/01/2021   ALBUMIN 2.9 (L) 11/06/2020   CALCIUM 9.2 11/01/2021   GFRAA 27 (L) 06/14/2021    Speciality Comments: No specialty comments available.  Procedures:  No procedures performed Allergies: Patient has no known allergies.        Assessment / Plan:     Visit Diagnoses: Seronegative rheumatoid arthritis ([place]): He has no joint tenderness or synovitis on examination.  He has not had any signs or symptoms of a rheumatoid arthritis flare.  He has clinically been doing well on Arava 10 mg 1 tablet by mouth daily.  He is tolerating Arava without any side effects.  He continues to follow-up closely with his nephrologist.  He has been avoiding the use of NSAIDs.  He is not experiencing any joint pain or inflammation at this time.  He has no morning stiffness, nocturnal pain, or difficulty with ADLs.  He will remain on low-dose Arava as prescribed.  He was advised to notify us if he develops increased joint pain or joint swelling.  He will follow-up in the office in 5 months.  High risk medication use - Arava 10 mg 1 tablet by mouth daily.  Followed by nephology. CBC and CMP were drawn on 11/01/2021.  His next lab work will be due in February and every 3 months to monitor for drug toxicity.  Standing orders for CBC and CMP remain in place. He has not had any recent infections.  Discussed the importance of holding [place] if he develops signs or symptoms of an  infection and to resume once the infection has completely cleared.  Idiopathic chronic gout of multiple sites without tophus - He has not had  any signs or symptoms of a gout flare.  For management of gout he remains on allopurinol 50 mg every other day.  He has not needed to take colchicine recently.  Sample of colchicine to have on hand was provided to the patient today.  Uric acid was within the desirable range: 5.5 on 11/01/2021.  He will remain on the current dose of allopurinol.  He was advised to notify us if he develops signs or symptoms of a gout flare.  Primary osteoarthritis of both hands: He has PIP and DIP thickening consistent with osteoarthritis of both hands.  Discussed the importance of joint protection and muscle strengthening.  DDD (degenerative disc disease), lumbar - Followed by Dr. Louanne Skye.  He has not having any increased discomfort in his lower back at this time.  He has no midline spinal tenderness or SI joint tenderness.  No symptoms of radiculopathy.  Other medical conditions are listed as follows:  History of hypertension: Discussed the importance of close blood pressure monitoring.  History of coronary artery disease  History of renal insufficiency syndrome - Followed by Dr. Theador Hawthorne.  His lab work has been monitored closely.  Creatinine was 2.36 and GFR is 28 on 11/01/21.  Discussed the importance of avoiding NSAID use.   History of hypercholesterolemia  History of prostate cancer  History of gastroesophageal reflux (GERD)  Osteoporosis screening - DEXA scan from February 08, 2021 was normal.  Orders: No orders of the defined types were placed in this encounter.  Meds ordered this encounter  Medications   allopurinol (ZYLOPRIM) 100 MG tablet    Sig: TAKE 1/2 TABLET EVERY OTHER DAY    Dispense:  45 tablet    Refill:  0      Follow-Up Instructions: Return in about 5 months (around 04/13/2022) for Rheumatoid arthritis, Osteoarthritis, Gout, DDD.   Ofilia Neas, PA-C  Note - This record has been created using Dragon software.  Chart creation errors have been sought, but may not always  have been located. Such creation errors do not reflect on  the standard of medical care.

## 2021-11-01 DIAGNOSIS — Z79899 Other long term (current) drug therapy: Secondary | ICD-10-CM | POA: Diagnosis not present

## 2021-11-01 DIAGNOSIS — M1A09X0 Idiopathic chronic gout, multiple sites, without tophus (tophi): Secondary | ICD-10-CM | POA: Diagnosis not present

## 2021-11-02 LAB — CBC WITH DIFFERENTIAL/PLATELET
Absolute Monocytes: 555 cells/uL (ref 200–950)
Basophils Absolute: 20 cells/uL (ref 0–200)
Basophils Relative: 0.4 %
Eosinophils Absolute: 250 cells/uL (ref 15–500)
Eosinophils Relative: 5 %
HCT: 41 % (ref 38.5–50.0)
Hemoglobin: 13 g/dL — ABNORMAL LOW (ref 13.2–17.1)
Lymphs Abs: 960 cells/uL (ref 850–3900)
MCH: 28.4 pg (ref 27.0–33.0)
MCHC: 31.7 g/dL — ABNORMAL LOW (ref 32.0–36.0)
MCV: 89.5 fL (ref 80.0–100.0)
MPV: 11.6 fL (ref 7.5–12.5)
Monocytes Relative: 11.1 %
Neutro Abs: 3215 cells/uL (ref 1500–7800)
Neutrophils Relative %: 64.3 %
Platelets: 166 10*3/uL (ref 140–400)
RBC: 4.58 10*6/uL (ref 4.20–5.80)
RDW: 12.6 % (ref 11.0–15.0)
Total Lymphocyte: 19.2 %
WBC: 5 10*3/uL (ref 3.8–10.8)

## 2021-11-02 LAB — COMPLETE METABOLIC PANEL WITH GFR
AG Ratio: 1.4 (calc) (ref 1.0–2.5)
ALT: 36 U/L (ref 9–46)
AST: 39 U/L — ABNORMAL HIGH (ref 10–35)
Albumin: 3.9 g/dL (ref 3.6–5.1)
Alkaline phosphatase (APISO): 91 U/L (ref 35–144)
BUN/Creatinine Ratio: 12 (calc) (ref 6–22)
BUN: 29 mg/dL — ABNORMAL HIGH (ref 7–25)
CO2: 26 mmol/L (ref 20–32)
Calcium: 9.2 mg/dL (ref 8.6–10.3)
Chloride: 106 mmol/L (ref 98–110)
Creat: 2.36 mg/dL — ABNORMAL HIGH (ref 0.70–1.28)
Globulin: 2.7 g/dL (calc) (ref 1.9–3.7)
Glucose, Bld: 139 mg/dL — ABNORMAL HIGH (ref 65–99)
Potassium: 4.8 mmol/L (ref 3.5–5.3)
Sodium: 140 mmol/L (ref 135–146)
Total Bilirubin: 0.4 mg/dL (ref 0.2–1.2)
Total Protein: 6.6 g/dL (ref 6.1–8.1)
eGFR: 28 mL/min/{1.73_m2} — ABNORMAL LOW (ref >=60)

## 2021-11-02 LAB — URIC ACID: Uric Acid, Serum: 5.5 mg/dL (ref 4.0–8.0)

## 2021-11-02 NOTE — Progress Notes (Signed)
Mild anemia noted which is a stable.  Glucose is elevated.  GFR is low and stable.  Uric acid is in desirable range.

## 2021-11-04 ENCOUNTER — Encounter: Payer: Self-pay | Admitting: Dermatology

## 2021-11-04 NOTE — Progress Notes (Signed)
   Follow-Up Visit   Subjective  Jacob Farmer is a 75 y.o. male who presents for the following: Annual Exam (Patient here today for yearly skin check. No new concerns. ).  Follow-up on eczema successfully treated with Dupixent, skin check. Location:  Duration:  Quality:  Associated Signs/Symptoms: Modifying Factors:  Severity:  Timing: Context:   Objective  Well appearing patient in no apparent distress; mood and affect are within normal limits. Left Forearm - Posterior, Left Upper Arm - Posterior, Right Forearm - Posterior, Torso - Posterior (Back) Patient remains bringing out and educating this 90% improved.  Able to sleep reclined.  No injection site reactions.  No ocular symptoms.  Left Lower Leg - Anterior Edema, hemosiderosis, minimal active inflammation.  No symptoms.    All skin waist up examined.   Assessment & Plan    Atopic dermatitis, unspecified type Left Upper Arm - Posterior; Left Forearm - Posterior; Right Forearm - Posterior; Torso - Posterior (Back)  I did discuss other systemic options including Adbry and JAK inhibitors but there is no indication to change therapy.  He knows he can contact me if there are any issues even if it is related to this and out-of-pocket cost.  Related Medications Dupilumab (DUPIXENT) 300 MG/2ML SOPN Inject 1 application into the skin every 14 (fourteen) days.  Venous stasis dermatitis of left lower extremity Left Lower Leg - Anterior  Discussed ways to minimize venous pressure.  Contact me if breaking it worsens.      I, Lavonna Monarch, MD, have reviewed all documentation for this visit.  The documentation on 11/04/21 for the exam, diagnosis, procedures, and orders are all accurate and complete.

## 2021-11-13 ENCOUNTER — Other Ambulatory Visit: Payer: Self-pay

## 2021-11-13 ENCOUNTER — Ambulatory Visit: Payer: Medicare HMO | Admitting: Physician Assistant

## 2021-11-13 ENCOUNTER — Encounter: Payer: Self-pay | Admitting: Physician Assistant

## 2021-11-13 VITALS — BP 166/84 | HR 66 | Ht 70.0 in | Wt 186.0 lb

## 2021-11-13 DIAGNOSIS — Z8679 Personal history of other diseases of the circulatory system: Secondary | ICD-10-CM

## 2021-11-13 DIAGNOSIS — Z87448 Personal history of other diseases of urinary system: Secondary | ICD-10-CM

## 2021-11-13 DIAGNOSIS — Z8546 Personal history of malignant neoplasm of prostate: Secondary | ICD-10-CM | POA: Diagnosis not present

## 2021-11-13 DIAGNOSIS — M19042 Primary osteoarthritis, left hand: Secondary | ICD-10-CM

## 2021-11-13 DIAGNOSIS — M1A09X0 Idiopathic chronic gout, multiple sites, without tophus (tophi): Secondary | ICD-10-CM | POA: Diagnosis not present

## 2021-11-13 DIAGNOSIS — M19041 Primary osteoarthritis, right hand: Secondary | ICD-10-CM

## 2021-11-13 DIAGNOSIS — M5136 Other intervertebral disc degeneration, lumbar region: Secondary | ICD-10-CM | POA: Diagnosis not present

## 2021-11-13 DIAGNOSIS — Z8719 Personal history of other diseases of the digestive system: Secondary | ICD-10-CM

## 2021-11-13 DIAGNOSIS — Z8639 Personal history of other endocrine, nutritional and metabolic disease: Secondary | ICD-10-CM

## 2021-11-13 DIAGNOSIS — Z79899 Other long term (current) drug therapy: Secondary | ICD-10-CM | POA: Diagnosis not present

## 2021-11-13 DIAGNOSIS — M0600 Rheumatoid arthritis without rheumatoid factor, unspecified site: Secondary | ICD-10-CM | POA: Diagnosis not present

## 2021-11-13 DIAGNOSIS — Z13820 Encounter for screening for osteoporosis: Secondary | ICD-10-CM

## 2021-11-13 MED ORDER — ALLOPURINOL 100 MG PO TABS: ORAL_TABLET | ORAL | 0 refills | Status: DC

## 2021-11-13 NOTE — Progress Notes (Signed)
Medication Samples have been provided to the patient.  Drug name: mitigare       Strength: 0.6mg         Qty: 1  LOT: [id number]  Exp.Date: 10/15/2022  Dosing instructions: Take 1 capsule by mouth as needed.

## 2021-11-13 NOTE — Patient Instructions (Signed)
Standing Labs We placed an order today for your standing lab work.   Please have your standing labs drawn in February and every 3 months   If possible, please have your labs drawn 2 weeks prior to your appointment so that the provider can discuss your results at your appointment.  Please note that you may see your imaging and lab results in MyChart before we have reviewed them. We may be awaiting multiple results to interpret others before contacting you. Please allow our office up to 72 hours to thoroughly review all of the results before contacting the office for clarification of your results.  We have open lab daily: Monday through Thursday from 1:30-4:30 PM and Friday from 1:30-4:00 PM at the office of Dr. Shaili Deveshwar, [facility].   Please be advised, all patients with office appointments requiring lab work will take precedent over walk-in lab work.  If possible, please come for your lab work on Monday and Friday afternoons, as you may experience shorter wait times. The office is located at [address], [address], [place], [place] [address] No appointment is necessary.   Labs are drawn by [facility]. Please bring your co-pay at the time of your lab draw.  You may receive a bill from [facility] for your lab work.  If you wish to have your labs drawn at another location, please call the office 24 hours in advance to send orders.  If you have any questions regarding directions or hours of operation,  please call [phone].   As a reminder, please drink plenty of water prior to coming for your lab work. Thanks!  

## 2021-11-19 ENCOUNTER — Ambulatory Visit: Payer: Medicare HMO | Admitting: Cardiology

## 2021-11-19 ENCOUNTER — Encounter: Payer: Self-pay | Admitting: Cardiology

## 2021-11-19 ENCOUNTER — Other Ambulatory Visit: Payer: Self-pay

## 2021-11-19 VITALS — BP 140/68 | HR 102 | Ht 70.0 in | Wt 187.4 lb

## 2021-11-19 DIAGNOSIS — I951 Orthostatic hypotension: Secondary | ICD-10-CM | POA: Diagnosis not present

## 2021-11-19 DIAGNOSIS — I10 Essential (primary) hypertension: Secondary | ICD-10-CM

## 2021-11-19 DIAGNOSIS — I2510 Atherosclerotic heart disease of native coronary artery without angina pectoris: Secondary | ICD-10-CM

## 2021-11-19 DIAGNOSIS — I4891 Unspecified atrial fibrillation: Secondary | ICD-10-CM

## 2021-11-19 DIAGNOSIS — I495 Sick sinus syndrome: Secondary | ICD-10-CM

## 2021-11-19 MED ORDER — APIXABAN 5 MG PO TABS: 5.0000 mg | ORAL_TABLET | Freq: Two times a day (BID) | ORAL | 3 refills | Status: DC

## 2021-11-19 MED ORDER — ROSUVASTATIN CALCIUM 40 MG PO TABS: ORAL_TABLET | ORAL | 3 refills | Status: DC

## 2021-11-19 MED ORDER — AMLODIPINE BESYLATE 5 MG PO TABS: 5.0000 mg | ORAL_TABLET | Freq: Every day | ORAL | 3 refills | Status: DC

## 2021-11-19 NOTE — Progress Notes (Signed)
Clinical Summary Mr. Horton is a 75 y.o.male seen today for follow up of the following medical problems.    1. Tachy/Bradycardia/Afib - ER visit9/2021 with bradycardia. HRs mid 30s in nephrology clinic, sent to ER - has had prior issues with bradycardia, we had been weaning his home beta blocker. At ER visit beta blocker was stopped - no significant symptoms at that time, was discharged from ER with outpatient monitor.  - zio patch with frequent supraventricular ectopy, runs of atach. Rare episodes of sinus brady to 30s in AM hours, unclear if sleeping. Of note he reports no significant dizziness episodes while wearing monitor.        08/2020 event monitor: Min HR 32, Max HR 136, Avg 69. Frequent PACs, short runs of atach. Episode of severe sinus brady at 32 at 7am, unclear if sleeping    - EKG today in clinic shows afib, new diagnosis for patient.  - no recent palpitations.  - reports easy bruising      2. Orthostatic hypotension -at prior clinic visit SBP drops 36 points with standing, DBP 26 points.    -no recent dizziness - working to stay hydrated.    3. CAD  - multiple interventions as described below    - no chest pain, no SOB/DOE       4. HTN  - home bp's usually 120s-140s/80s     5 Hyperlipidemia   - was off statin due to LFT elevation workup. He is back on statin, LFTs have normalized.    05/2021 TC 113 HDL 49 TG 82 LDL 48   4. CKD III - followed by renal Dr Theador Hawthorne   5. Prostate CA - had radioactive seeds placed, followed by urology.      6. Rheumatoid arthritis - followed by rheum       Past Medical History:  Diagnosis Date   CAD (coronary artery disease)    Acute myocardial infarction treated with TPA in 1990; 1999-stents to circumflex and RCA; residual total occlusion of the left anterior descending; normal ejection fraction; stress nuclear in 2001-distal anteroseptal ischemia; normal LV function   CKD (chronic kidney disease) stage 4,  GFR 15-29 ml/min (HCC)    First degree heart block    History of gastric ulcer    History of kidney stones    History of MI (myocardial infarction)    1990-  treated w/ TPA   Hyperlipidemia    Hypertension    Jaundice    OA (osteoarthritis)    Prediabetes    Prostate cancer (HCC)    Stage T1c , Gleason 3+4,  PSA 4.7,  vol 24cc--  scheduled for radiative seed implants   Psoriatic arthritis (HCC)    RA (rheumatoid arthritis) ([place])    Dr. Toni Amend   Sinus bradycardia    Wears dentures      No Known Allergies   Current Outpatient Medications  Medication Sig Dispense Refill   Alcohol Swabs (DROPSAFE ALCOHOL PREP) 70 % PADS USE AS NEEDED TO CLEAN SKIN PRIOR TO FINGERPRICK AND INJECTION 100 each 5   allopurinol (ZYLOPRIM) 100 MG tablet TAKE 1/2 TABLET EVERY OTHER DAY 45 tablet 0   amLODipine (NORVASC) 5 MG tablet Take 1 tablet (5 mg total) by mouth daily. 90 tablet 3   aspirin 81 MG tablet Take 1 tablet (81 mg total) by mouth 3 (three) times a week. 30 tablet    Blood Glucose Monitoring Suppl (ACCU-CHEK GUIDE) w/Device KIT 1 Units by Does  not apply route 2 (two) times daily. 1 kit 3   Cholecalciferol (VITAMIN D) 125 MCG (5000 UT) CAPS Take 5,000 Units by mouth daily.     Colchicine (MITIGARE) 0.6 MG CAPS Take 0.6 mg by mouth as needed.     Dupilumab (DUPIXENT) 300 MG/2ML SOPN Inject 1 application into the skin every 14 (fourteen) days. 3.92 mL 8   FLUZONE HIGH-DOSE QUADRIVALENT 0.7 ML SUSY  (Patient not taking: Reported on 03/50/0938)     folic acid (FOLVITE) 182 MCG tablet Take 400 mcg by mouth daily.     glucose blood (ACCU-CHEK GUIDE) test strip Use as instructed 100 each 12   Glucose Blood (BLOOD GLUCOSE TEST STRIPS) STRP Inject 100 Units into the skin 2 (two) times daily. Please provide the brand name Contour test strips Diagnosis: E 11.9. 100 strip 3   hydrALAZINE (APRESOLINE) 25 MG tablet Take 50 mg by mouth 2 (two) times daily.     Lancets (ACCU-CHEK SOFT TOUCH) lancets Use  as instructed 100 each 12   leflunomide (ARAVA) 10 MG tablet Take 1 tablet (10 mg total) by mouth daily. 90 tablet 0   losartan (COZAAR) 25 MG tablet Take 0.5 tablets (12.5 mg total) by mouth daily. (Patient taking differently: Take 25 mg by mouth daily.) 45 tablet 3   MODERNA COVID-19 BIVAL BOOSTER 50 MCG/0.5ML injection  (Patient not taking: Reported on 11/13/2021)     nitroGLYCERIN (NITROSTAT) 0.4 MG SL tablet Place 0.4 mg under the tongue every 5 (five) minutes as needed for chest pain.      rosuvastatin (CRESTOR) 40 MG tablet TAKE 1 TABLET EVERY DAY . STOP ATORVASTATIN 90 tablet 3   triamcinolone cream (KENALOG) 0.1 % Apply 1 application topically daily as needed (rash). 454 g 3   No current facility-administered medications for this visit.     Past Surgical History:  Procedure Laterality Date   BIOPSY  08/27/2019   Procedure: BIOPSY;  Surgeon: Rogene Houston, MD;  Location: [facility];  Service: Endoscopy;;  gastric   CARDIOVASCULAR STRESS TEST  2001  per Dr Lattie Haw clinic note   distal anteroseptal ischemia,  normal LVF   COLONOSCOPY  last one 2006 (approx)   [place]   DES x2  to CFX and RCA/  residual total occlusion LAD,  normal LVEF   ESOPHAGOGASTRODUODENOSCOPY (EGD) WITH PROPOFOL N/A 08/27/2019   Procedure: ESOPHAGOGASTRODUODENOSCOPY (EGD) WITH PROPOFOL;  Surgeon: Rogene Houston, MD;  Location: [facility];  Service: Endoscopy;  Laterality: N/A;  10:10   EYE SURGERY Bilateral    cataract   POSTERIOR CERVICAL FUSION/FORAMINOTOMY N/A 05/26/2017   Procedure: RIGHT C4-5 FORAMINOTOMY WITH EXCISION OF HERNIATED [place];  Surgeon: Jessy Oto, MD;  Location: [place];  Service: Orthopedics;  Laterality: N/A;   RADIOACTIVE SEED IMPLANT N/A 01/11/2016   Procedure: RADIOACTIVE SEED IMPLANT/BRACHYTHERAPY IMPLANT;  Surgeon: Franchot Gallo, MD;  Location: [facility];  Service: Urology;  Laterality: N/A;   50  seeds implanted no  seeds founds in bladder     No Known Allergies    Family History  Problem Relation Age of Onset   Heart attack Mother    Coronary artery disease Father    Ulcerative colitis Father    Ulcers Father    Breast cancer Sister    COPD Brother    Pancreatic cancer Brother    Healthy Sister    Diabetes Brother    Cirrhosis Son    Seizures  Son      Social History Mr. Reetz reports that he quit smoking about 32 years ago. His smoking use included cigarettes. He has a 30.00 pack-year smoking history. He has never used smokeless tobacco. Mr. Cozby reports that he does not currently use alcohol.   Review of Systems CONSTITUTIONAL: No weight loss, fever, chills, weakness or fatigue.  HEENT: Eyes: No visual loss, blurred vision, double vision or yellow sclerae.No hearing loss, sneezing, congestion, runny nose or sore throat.  SKIN: No rash or itching.  CARDIOVASCULAR: per hpi RESPIRATORY: No shortness of breath, cough or sputum.  GASTROINTESTINAL: No anorexia, nausea, vomiting or diarrhea. No abdominal pain or blood.  GENITOURINARY: No burning on urination, no polyuria NEUROLOGICAL: No headache, dizziness, syncope, paralysis, ataxia, numbness or tingling in the extremities. No change in bowel or bladder control.  MUSCULOSKELETAL: No muscle, back pain, joint pain or stiffness.  LYMPHATICS: No enlarged nodes. No history of splenectomy.  PSYCHIATRIC: No history of depression or anxiety.  ENDOCRINOLOGIC: No reports of sweating, cold or heat intolerance. No polyuria or polydipsia.  Marland Kitchen   Physical Examination Today's Vitals   11/19/21 0830  BP: 140/68  Pulse: (!) 102  SpO2: 96%  Weight: 187 lb 6.4 oz (85 kg)  Height: _0  (1.778 m)   Body mass index is 26.89 kg/m.  Gen: resting comfortably, no acute distress HEENT: no scleral icterus, pupils equal round and reactive, no palptable cervical adenopathy,  CV: irreg, no m/r/g no jvd Resp: Clear to auscultation bilaterally GI:  abdomen is soft, non-tender, non-distended, normal bowel sounds, no hepatosplenomegaly MSK: extremities are warm, no edema.  Skin: warm, no rash Neuro:  no focal deficits Psych: appropriate affect   Diagnostic Studies 08/2020 event monitor 7 day zio patch Min HR 32, Max HR 136, Avg HR 69 Frequent supraventricular ectopy in the form of isolated PACs (14%), couplets (2.6%), triplets (1.8%).Short runs of atrial tachycardia Rare ventricular ectopy in the form of isolated PVCs and couplets Episode of severe sinus bradycardia to 32 at 7AM, unclear if sleeping. No symptoms reported    Assessment and Plan  1. Tachycardia/bradycardia/Afib -new diagnosis of afib made in clinic today - no symptoms - rates right at 100, with prior issues with bradycardya on av nodal agents would not add at this time - CHADS2Vasc score is 4 (HTN, age x 2, CAD), will start eliquis 9m bid - if persistent afib over time could consider trial of DCCV. He is completely asymptomatic now with self controlled rates, do not see strong indication for early DCCV     2. Orthostatic hypotension - no symptoms, continue aggressive hydration     3. CAD - no symptoms, stopping asa and startin eliquis 535mbid   4. HTN -home bp's are overall at goal, with prior orthostatic hypotension would be careful with med changes in the future.    5. Hyperlipidemia - at goal, continue current meds      JoArnoldo LenisM.D.

## 2021-11-19 NOTE — Patient Instructions (Signed)
Medication Instructions:  Your physician has recommended you make the following change in your medication:  STOP Aspirin 81 mg tablets START Eliquis 5 mg tablets twice daily  *If you need a refill on your cardiac medications before your next appointment, please call your pharmacy*   Lab Work: None If you have labs (blood work) drawn today and your tests are completely normal, you will receive your results only by: [place] (if you have MyChart) OR A paper copy in the mail If you have any lab test that is abnormal or we need to change your treatment, we will call you to review the results.   Testing/Procedures: None   Follow-Up: At [facility], you and your health needs are our priority.  As part of our continuing mission to provide you with exceptional heart care, we have created designated Provider Care Teams.  These Care Teams include your primary Cardiologist (physician) and Advanced Practice Providers (APPs -  Physician Assistants and Nurse Practitioners) who all work together to provide you with the care you need, when you need it.  We recommend signing up for the patient portal called "MyChart".  Sign up information is provided on this After Visit Summary.  MyChart is used to connect with patients for Virtual Visits (Telemedicine).  Patients are able to view lab/test results, encounter notes, upcoming appointments, etc.  Non-urgent messages can be sent to your provider as well.   To learn more about what you can do with MyChart, go to [email or web address].    Your next appointment:   6 month(s)  The format for your next appointment:   In Person  Provider:   Carlyle Dolly, MD    Other Instructions

## 2021-11-20 ENCOUNTER — Telehealth: Payer: Self-pay | Admitting: Physical Medicine and Rehabilitation

## 2021-11-20 ENCOUNTER — Telehealth: Payer: Self-pay | Admitting: Cardiology

## 2021-11-20 MED ORDER — APIXABAN 5 MG PO TABS: 5.0000 mg | ORAL_TABLET | Freq: Two times a day (BID) | ORAL | 3 refills | Status: DC

## 2021-11-20 NOTE — Telephone Encounter (Signed)
Patient received a Eliquis discount card yesterday but the rx was called in to the mail order pharmarcy instead of [facility] in [place]. Please call when this is done so he can pick up.

## 2021-11-20 NOTE — Telephone Encounter (Signed)
Pt's wife [name] called requesting a call back an left leg pains. She states her husband seen both doctors in 2021. Unsure if he can see Dr. Ernestina Patches by choice. Please call pt back about this matter at [phone].

## 2021-11-20 NOTE — Telephone Encounter (Signed)
Can you see patient for this or do you want me to have him see Louanne Skye or Jeneen Rinks?

## 2021-11-20 NOTE — Telephone Encounter (Signed)
Eliquis sent to correct pharmacy. Pt notified and verbalized understanding.

## 2021-11-21 NOTE — Telephone Encounter (Signed)
Scheduled for 11/22/21

## 2021-11-22 ENCOUNTER — Ambulatory Visit: Payer: Medicare HMO | Admitting: Physical Medicine and Rehabilitation

## 2021-11-22 ENCOUNTER — Ambulatory Visit: Payer: Medicare HMO | Admitting: Cardiology

## 2021-11-22 ENCOUNTER — Other Ambulatory Visit: Payer: Self-pay

## 2021-11-22 ENCOUNTER — Encounter: Payer: Self-pay | Admitting: Physical Medicine and Rehabilitation

## 2021-11-22 VITALS — BP 136/78 | HR 92

## 2021-11-22 DIAGNOSIS — M48061 Spinal stenosis, lumbar region without neurogenic claudication: Secondary | ICD-10-CM | POA: Diagnosis not present

## 2021-11-22 DIAGNOSIS — M4726 Other spondylosis with radiculopathy, lumbar region: Secondary | ICD-10-CM | POA: Diagnosis not present

## 2021-11-22 DIAGNOSIS — M47816 Spondylosis without myelopathy or radiculopathy, lumbar region: Secondary | ICD-10-CM | POA: Diagnosis not present

## 2021-11-22 DIAGNOSIS — M5416 Radiculopathy, lumbar region: Secondary | ICD-10-CM

## 2021-11-22 NOTE — Progress Notes (Signed)
Pt state lower back pain that travels to his right knee. Pt state during the middle of the night the pain would start with his knee. Pt state he doesn't take anything for the pain but he uses a back brase.   Numeric Pain Rating Scale and Functional Assessment Average Pain 7 Pain Right Now 3 My pain is intermittent, dull, and aching Pain is worse with: some activites and laying down Pain improves with: rest   In the last MONTH (on 0-10 scale) has pain interfered with the following?  1. General activity like being  able to carry out your everyday physical activities such as walking, climbing stairs, carrying groceries, or moving a chair?  Rating(7)  2. Relation with others like being able to carry out your usual social activities and roles such as  activities at home, at work and in your community. Rating(8)  3. Enjoyment of life such that you have  been bothered by emotional problems such as feeling anxious, depressed or irritable?  Rating(9)

## 2021-11-22 NOTE — Progress Notes (Signed)
Jacob Farmer - 75 y.o. male MRN [id number]  Date of birth: 1946-07-09  Office Visit Note: Visit Date: 11/22/2021 PCP: Lindell Spar, MD Referred by: Lindell Spar, MD  Subjective: Chief Complaint  Patient presents with   Lower Back - Pain   Right Knee - Pain   HPI: Jacob Farmer is a 75 y.o. male who comes in today for evaluation of chronic, worsening and severe right sided lower back pain radiating down right lateral leg to knee. Patient reports pain has been ongoing for several years. We did see patient in 2020 and performed bilateral L4 transforaminal epidural steroid injections which he reports gave him significant pain relief. Patient reports pain is exacerbated by prolonged walking and standing, describes as a sore and throbbing sensation, currently rates as 8 out of 10. Patient states he is unable to walk for long periods/distances without stopping to rest his legs. Patient reports some relief of pain with home exercise regimen, rest, sitting and use of back brace. Patient's lumbar MRI from 2017 exhibits foraminal narrowing at the level of L3-L4 through L5-S1: Severe on the left at L5-S1. No high grade spinal canal stenosis noted. Patient states his pain feels similar to previous years, however is pain is now localized to right side. Patient states he is a very active person and does work outside in his yard and garden several times a week. Patient does have rheumatoid arthritis and is currently being managed by Dr. Bo Merino at [facility].  Patient denies focal weakness, numbness and tingling.  Patient denies recent trauma or falls.  Patient's course is complicated by rheumatoid arthritis, coronary artery disease, chronic kidney disease and diabetes mellitus.   Review of Systems  Musculoskeletal:  Positive for back pain.  Neurological:  Negative for tingling, sensory change, focal weakness and weakness.  All other systems reviewed and are negative. Otherwise  per HPI.  Assessment & Plan: Visit Diagnoses:    ICD-10-CM   1. Lumbar radiculopathy  M54.16 Ambulatory referral to Physical Medicine Rehab    2. Other spondylosis with radiculopathy, lumbar region  M47.26     3. Foraminal stenosis of lumbar region  M48.061     4. Facet hypertrophy of lumbar region  M47.816        Plan: Findings:  Chronic, worsening and severe right-sided lower back pain radiating down right lateral leg to knee.  Patient continues to have severe pain despite good conservative therapy such as home exercise regimen, rest and use of back brace.  Patient's clinical presentation and exam are consistent with L4 nerve pattern.  Patient had good pain relief with transforaminal epidural steroid injection in 2020.  We believe the next step is to perform a diagnostic and hopefully therapeutic right L4 transforaminal epidural steroid injection under fluoroscopic guidance.  The patient gets good pain relief with epidural injection we will continue to monitor, however if his pain persists we would consider performing epidural injection at the L5 level, think about new MRI and we would also look at regrouping with formal physical therapy.  No red flag symptoms noted upon exam today.   Meds & Orders: No orders of the defined types were placed in this encounter.   Orders Placed This Encounter  Procedures   Ambulatory referral to Physical Medicine Rehab    Follow-up: Return for Right L4 transforarminal epidural steroid injection.   Procedures: No procedures performed      Clinical History: MRI CERVICAL SPINE WITHOUT CONTRAST  TECHNIQUE: Multiplanar, multisequence MR imaging of the cervical spine was performed. No intravenous contrast was administered.   COMPARISON:  Prior study from 05/05/2017.   FINDINGS: Alignment: Straightening with reversal of the normal cervical lordosis. 2 mm anterolisthesis of C2 on C3.   Vertebrae: Vertebral body height maintained without acute or  chronic fracture. Bone marrow signal intensity within normal limits. Few scattered subcentimeter benign hemangiomata noted. No worrisome osseous lesions. No abnormal marrow edema.   Cord: Normal signal and morphology.   Posterior Fossa, vertebral arteries, paraspinal tissues: Visualized brain and posterior fossa within normal limits. Craniocervical junction normal. Postoperative scarring noted within the posterior paraspinous soft tissues. Normal flow voids seen within the vertebral arteries bilaterally.   Disc levels:   C2-C3: Trace anterolisthesis. Right eccentric disc bulge with right-sided uncovertebral and facet hypertrophy. Flattening of the ventral thecal sac without significant spinal stenosis. Moderate right C3 foraminal narrowing, progressed from previous. Left neural foramen remains patent.   C3-C4: Diffuse disc osteophyte complex with intervertebral disc space narrowing. Disc osteophyte eccentric to the right. Superimposed mild left greater than right facet hypertrophy. Flattening of the ventral thecal sac without significant spinal stenosis. Severe left with moderate right C4 foraminal narrowing.   C4-C5: Postoperative changes from prior right laminectomy and foraminotomy. Persistent right foraminal disc osteophyte complex (series 7, image 12). Additional broad posterior component flattens the ventral thecal sac with secondary mild cord flattening. Superimposed mild facet and ligament flavum hypertrophy. Mild spinal stenosis. Severe right with mild left C5 foraminal narrowing.   C5-C6: Circumferential disc bulge with uncovertebral spurring. Broad posterior disc osteophyte flattens and effaces the ventral thecal sac resultant mild spinal stenosis. Moderate right with mild left C6 foraminal narrowing.   C6-C7: Circumferential disc bulge with left greater than right uncovertebral spurring. Flattening of the ventral thecal sac without significant spinal stenosis.  Moderate left with mild right C7 foraminal narrowing.   C7-T1: Mild to moderate left greater than right facet hypertrophy. Minimal annular disc bulge. No spinal stenosis. Foramina remain patent.   Visualized upper thoracic spine demonstrates mild noncompressive disc bulging at T1-2 and T2-3.   IMPRESSION: 1. Postoperative changes from prior right laminectomy and foraminotomy at C4-5. Residual and/or recurrent right foraminal disc osteophyte complex at this level with persistent severe right C5 foraminal stenosis. 2. Right eccentric disc osteophyte complex at C3-4 with resultant severe left and moderate right C4 foraminal stenosis. 3. Advanced right-sided uncovertebral and facet degeneration at C2-3 with resultant moderate right C3 foraminal stenosis. 4. 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     Electronically Signed   By: Jeannine Boga M.D.   On: 09/11/2020 05:32   He reports that he quit smoking about 32 years ago. His smoking use included cigarettes. He has a 30.00 pack-year smoking history. He has never used smokeless tobacco.  Recent Labs    06/11/21 1142 06/14/21 1322 11/01/21 1009  HGBA1C  --  5.4  --   LABURIC 5.6  --  5.5    Objective:  VS:  HT:    WT:   BMI:     BP:136/78  HR:92bpm  TEMP: ( )  RESP:  Physical Exam Vitals and nursing note reviewed.  HENT:     Head: Normocephalic and atraumatic.     Right Ear: External ear normal.     Left Ear: External ear normal.     Nose: Nose normal.     Mouth/Throat:  Mouth: Mucous membranes are moist.  Eyes:     Extraocular Movements: Extraocular movements intact.  Cardiovascular:     Rate and Rhythm: Normal rate.     Pulses: Normal pulses.  Pulmonary:     Effort: Pulmonary effort is normal.  Abdominal:     General: Abdomen is flat. There is no distension.  Musculoskeletal:        General: Tenderness present.     Cervical  back: Normal range of motion.     Comments: Pt is slow to rise from seated position to standing. Good lumbar range of motion. Strong distal strength without clonus, no pain upon palpation of greater trochanters. Sensation intact bilaterally. Dysesthesias noted to right L4 dermatome. Walks independently, gait slow. Positive slump test.    Skin:    General: Skin is warm and dry.     Capillary Refill: Capillary refill takes less than 2 seconds.  Neurological:     General: No focal deficit present.     Mental Status: He is alert and oriented to person, place, and time.  Psychiatric:        Mood and Affect: Mood normal.    Ortho Exam  Imaging: No results found.  Past Medical/Family/Surgical/Social History: Medications & Allergies reviewed per EMR, new medications updated. 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 coma, without long-term current use of insulin ([place]) 12/27/2011   Rheumatoid arthritis ([place]) 01/04/2011   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 10/02/2010   SINUS BRADYCARDIA 01/12/2010   Coronary artery disease involving native coronary artery of native heart without angina pectoris 01/12/2010   Hyperlipidemia 01/08/2010   Essential hypertension 01/08/2010   Past Medical History:  Diagnosis Date   CAD (coronary artery disease)    Acute myocardial infarction treated with TPA in 1990; 1999-stents to circumflex and RCA;  residual total occlusion of the left anterior descending; normal ejection fraction; stress nuclear in 2001-distal anteroseptal ischemia; normal LV function   CKD (chronic kidney disease) stage 4, GFR 15-29 ml/min (HCC)    First degree heart block    History of gastric ulcer    History of kidney stones    History of MI (myocardial infarction)    1990-  treated w/ TPA   Hyperlipidemia    Hypertension    Jaundice    OA (osteoarthritis)    Prediabetes    Prostate cancer (HCC)    Stage T1c , Gleason 3+4,  PSA 4.7,  vol 24cc--  scheduled for radiative seed implants   Psoriatic arthritis ([place])    RA (rheumatoid arthritis) ([place])    Dr. Toni Amend   Sinus bradycardia    Wears dentures    Family History  Problem Relation Age of Onset   Heart attack Mother    Coronary artery disease Father    Ulcerative colitis Father    Ulcers Father    Breast cancer Sister    COPD Brother    Pancreatic cancer Brother  Healthy Sister    Diabetes Brother    Cirrhosis Son    Seizures Son    Past Surgical History:  Procedure Laterality Date   BIOPSY  08/27/2019   Procedure: BIOPSY;  Surgeon: Rogene Houston, MD;  Location: [facility];  Service: Endoscopy;;  gastric   CARDIOVASCULAR STRESS TEST  2001  per Dr Lattie Haw clinic note   distal anteroseptal ischemia,  normal LVF   COLONOSCOPY  last one 2006 (approx)   [place]   DES x2  to CFX and RCA/  residual total occlusion LAD,  normal LVEF   ESOPHAGOGASTRODUODENOSCOPY (EGD) WITH PROPOFOL N/A 08/27/2019   Procedure: ESOPHAGOGASTRODUODENOSCOPY (EGD) WITH PROPOFOL;  Surgeon: Rogene Houston, MD;  Location: [facility];  Service: Endoscopy;  Laterality: N/A;  10:10   EYE SURGERY Bilateral    cataract   POSTERIOR CERVICAL FUSION/FORAMINOTOMY N/A 05/26/2017   Procedure: RIGHT C4-5 FORAMINOTOMY WITH EXCISION OF HERNIATED [place];  Surgeon: Jessy Oto, MD;  Location: [place];  Service: Orthopedics;  Laterality: N/A;    RADIOACTIVE SEED IMPLANT N/A 01/11/2016   Procedure: RADIOACTIVE SEED IMPLANT/BRACHYTHERAPY IMPLANT;  Surgeon: Franchot Gallo, MD;  Location[facility];  Service: Urology;  Laterality: N/A;   70  seeds implanted no seeds founds in bladder   Social History   Occupational History   Occupation: Retired Clinical biochemist  Tobacco Use   Smoking status: Former    Packs/day: 1.00    Years: 30.00    Pack years: 30.00    Types: Cigarettes    Quit date: 05/21/1989    Years since quitting: 32.5   Smokeless tobacco: Never  Vaping Use   Vaping Use: Never used  Substance and Sexual Activity   Alcohol use: Not Currently    Comment: 1 beer per day   Drug use: No   Sexual activity: Not on file

## 2021-11-26 ENCOUNTER — Telehealth: Payer: Self-pay | Admitting: *Deleted

## 2021-11-26 DIAGNOSIS — L209 Atopic dermatitis, unspecified: Secondary | ICD-10-CM

## 2021-11-26 NOTE — Telephone Encounter (Signed)
Fax from [place] [facility] my way- stating patient enrolled through patient assistance program and will received dupixent at no cost through 12/15/2022.   Reference number: [id number]

## 2021-12-18 ENCOUNTER — Ambulatory Visit: Payer: Self-pay

## 2021-12-18 ENCOUNTER — Ambulatory Visit: Payer: Medicare HMO | Admitting: Physical Medicine and Rehabilitation

## 2021-12-18 ENCOUNTER — Other Ambulatory Visit: Payer: Self-pay

## 2021-12-18 ENCOUNTER — Encounter: Payer: Self-pay | Admitting: Physical Medicine and Rehabilitation

## 2021-12-18 VITALS — BP 163/79 | HR 101

## 2021-12-18 DIAGNOSIS — M5416 Radiculopathy, lumbar region: Secondary | ICD-10-CM | POA: Diagnosis not present

## 2021-12-18 MED ORDER — METHYLPREDNISOLONE ACETATE 80 MG/ML IJ SUSP
80.0000 mg | Freq: Once | INTRAMUSCULAR | Status: AC
  Administered 2021-12-18: 80 mg

## 2021-12-18 NOTE — Patient Instructions (Signed)

## 2021-12-18 NOTE — Progress Notes (Signed)
Pt state lower back pain that travels to his both knee. Pt state during the middle of the night the pain would start with his knee. Pt state he doesn't take anything for the pain but he uses a back brase.  Numeric Pain Rating Scale and Functional Assessment Average Pain 2   In the last MONTH (on 0-10 scale) has pain interfered with the following?  1. General activity like being  able to carry out your everyday physical activities such as walking, climbing stairs, carrying groceries, or moving a chair?  Rating(9)   +Driver, -BT, -Dye Allergies.

## 2021-12-20 ENCOUNTER — Other Ambulatory Visit: Payer: Self-pay

## 2021-12-20 DIAGNOSIS — M0600 Rheumatoid arthritis without rheumatoid factor, unspecified site: Secondary | ICD-10-CM

## 2021-12-20 MED ORDER — LEFLUNOMIDE 10 MG PO TABS: 10.0000 mg | ORAL_TABLET | Freq: Every day | ORAL | 0 refills | Status: DC

## 2021-12-20 NOTE — Telephone Encounter (Signed)
Next Visit: 04/09/2022  Last Visit: 11/13/2021  Last Fill: 03/23/2021  DX: Seronegative rheumatoid arthritis   Current Dose per office note 11/13/2021: Arava 10 mg 1 tablet by mouth daily  Labs: 11/01/2021 Mild anemia noted which is a stable.  Glucose is elevated.  GFR is low and stable.  Uric acid is in desirable range.  Okay to refill Arava?

## 2021-12-20 NOTE — Telephone Encounter (Signed)
Patient's wife [name] called requesting prescription refill of Leflunomide 10 mg 90 day supply to be sent to [place].  [name] requested a return call to let her know the prescription has been sent.

## 2021-12-28 ENCOUNTER — Telehealth: Payer: Self-pay | Admitting: Physical Medicine and Rehabilitation

## 2021-12-28 NOTE — Telephone Encounter (Signed)
Pt had inj on 12/18/21 which helped on right side and now feeling pain on left side and wanted to see if he can get an inj on that side to help as well. The best call back number is [phone].

## 2022-01-02 DIAGNOSIS — N184 Chronic kidney disease, stage 4 (severe): Secondary | ICD-10-CM | POA: Diagnosis not present

## 2022-01-02 DIAGNOSIS — I5032 Chronic diastolic (congestive) heart failure: Secondary | ICD-10-CM | POA: Diagnosis not present

## 2022-01-02 DIAGNOSIS — Z79899 Other long term (current) drug therapy: Secondary | ICD-10-CM | POA: Diagnosis not present

## 2022-01-02 DIAGNOSIS — N189 Chronic kidney disease, unspecified: Secondary | ICD-10-CM | POA: Diagnosis not present

## 2022-01-02 DIAGNOSIS — E211 Secondary hyperparathyroidism, not elsewhere classified: Secondary | ICD-10-CM | POA: Diagnosis not present

## 2022-01-02 DIAGNOSIS — R809 Proteinuria, unspecified: Secondary | ICD-10-CM | POA: Diagnosis not present

## 2022-01-02 DIAGNOSIS — D631 Anemia in chronic kidney disease: Secondary | ICD-10-CM | POA: Diagnosis not present

## 2022-01-02 DIAGNOSIS — I129 Hypertensive chronic kidney disease with stage 1 through stage 4 chronic kidney disease, or unspecified chronic kidney disease: Secondary | ICD-10-CM | POA: Diagnosis not present

## 2022-01-08 DIAGNOSIS — E211 Secondary hyperparathyroidism, not elsewhere classified: Secondary | ICD-10-CM | POA: Diagnosis not present

## 2022-01-08 DIAGNOSIS — D508 Other iron deficiency anemias: Secondary | ICD-10-CM | POA: Diagnosis not present

## 2022-01-08 DIAGNOSIS — I5032 Chronic diastolic (congestive) heart failure: Secondary | ICD-10-CM | POA: Diagnosis not present

## 2022-01-08 DIAGNOSIS — I129 Hypertensive chronic kidney disease with stage 1 through stage 4 chronic kidney disease, or unspecified chronic kidney disease: Secondary | ICD-10-CM | POA: Diagnosis not present

## 2022-01-08 DIAGNOSIS — D631 Anemia in chronic kidney disease: Secondary | ICD-10-CM | POA: Diagnosis not present

## 2022-01-08 DIAGNOSIS — R809 Proteinuria, unspecified: Secondary | ICD-10-CM | POA: Diagnosis not present

## 2022-01-08 DIAGNOSIS — N184 Chronic kidney disease, stage 4 (severe): Secondary | ICD-10-CM | POA: Diagnosis not present

## 2022-01-08 DIAGNOSIS — N189 Chronic kidney disease, unspecified: Secondary | ICD-10-CM | POA: Diagnosis not present

## 2022-01-10 ENCOUNTER — Encounter (INDEPENDENT_AMBULATORY_CARE_PROVIDER_SITE_OTHER): Payer: Self-pay | Admitting: *Deleted

## 2022-01-13 NOTE — Procedures (Signed)
Lumbosacral Transforaminal Epidural Steroid Injection - Sub-Pedicular Approach with Fluoroscopic Guidance  Patient: Jacob Farmer      Date of Birth: 08-01-1946 MRN: [id number] PCP: Lindell Spar, MD      Visit Date: 12/18/2021   Universal Protocol:    Date/Time: 12/18/2021  Consent Given By: the patient  Position: PRONE  Additional Comments: Vital signs were monitored before and after the procedure. Patient was prepped and draped in the usual sterile fashion. The correct patient, procedure, and site was verified.   Injection Procedure Details:   Procedure diagnoses: Lumbar radiculopathy [M54.16]    Meds Administered:  Meds ordered this encounter  Medications   methylPREDNISolone acetate (DEPO-MEDROL) injection 80 mg    Laterality: Right  Location/Site: L4  Needle:5.0 in., 22 ga.  Short bevel or Quincke spinal needle  Needle Placement: Transforaminal  Findings:    -Comments: Excellent flow of contrast along the nerve, nerve root and into the epidural space.  Procedure Details: After squaring off the end-plates to get a true AP view, the C-arm was positioned so that an oblique view of the foramen as noted above was visualized. The target area is just inferior to the "nose of the scotty dog" or sub pedicular. The soft tissues overlying this structure were infiltrated with 2-3 ml. of 1% Lidocaine without Epinephrine.  The spinal needle was inserted toward the target using a "trajectory" view along the fluoroscope beam.  Under AP and lateral visualization, the needle was advanced so it did not puncture dura and was located close the 6 O'Clock position of the pedical in AP tracterory. Biplanar projections were used to confirm position. Aspiration was confirmed to be negative for CSF and/or blood. A 1-2 ml. volume of Isovue-250 was injected and flow of contrast was noted at each level. Radiographs were obtained for documentation purposes.   After attaining the desired flow  of contrast documented above, a 0.5 to 1.0 ml test dose of 0.25% Marcaine was injected into each respective transforaminal space.  The patient was observed for 90 seconds post injection.  After no sensory deficits were reported, and normal lower extremity motor function was noted,   the above injectate was administered so that equal amounts of the injectate were placed at each foramen (level) into the transforaminal epidural space.   Additional Comments:  The patient tolerated the procedure well Dressing: 2 x 2 sterile gauze and Band-Aid    Post-procedure details: Patient was observed during the procedure. Post-procedure instructions were reviewed.  Patient left the clinic in stable condition.

## 2022-01-13 NOTE — Progress Notes (Signed)
Jacob Farmer - 76 y.o. male MRN [id number]  Date of birth: [birth year]  Office Visit Note: Visit Date: 12/18/2021 PCP: Lindell Spar, MD Referred by: Lindell Spar, MD  Subjective: Chief Complaint  Patient presents with   Lower Back - Pain   Right Knee - Pain   Left Knee - Pain   HPI:  Jacob Farmer is a 76 y.o. male who comes in today at the request of Barnet Pall, FNP for planned Right L4-5 Lumbar Transforaminal epidural steroid injection with fluoroscopic guidance.  The patient has failed conservative care including home exercise, medications, time and activity modification.  This injection will be diagnostic and hopefully therapeutic.  Please see requesting physician notes for further details and justification.  ROS Otherwise per HPI.  Assessment & Plan: Visit Diagnoses:    ICD-10-CM   1. Lumbar radiculopathy  M54.16 XR C-ARM NO REPORT    Epidural Steroid injection    methylPREDNISolone acetate (DEPO-MEDROL) injection 80 mg      Plan: No additional findings.   Meds & Orders:  Meds ordered this encounter  Medications   methylPREDNISolone acetate (DEPO-MEDROL) injection 80 mg    Orders Placed This Encounter  Procedures   XR C-ARM NO REPORT   Epidural Steroid injection    Follow-up: Return if symptoms worsen or fail to improve.   Procedures: No procedures performed  Lumbosacral Transforaminal Epidural Steroid Injection - Sub-Pedicular Approach with Fluoroscopic Guidance  Patient: Jacob Farmer      Date of Birth: 09-28-1946 MRN: [id number] PCP: Lindell Spar, MD      Visit Date: 12/18/2021   Universal Protocol:    Date/Time: 12/18/2021  Consent Given By: the patient  Position: PRONE  Additional Comments: Vital signs were monitored before and after the procedure. Patient was prepped and draped in the usual sterile fashion. The correct patient, procedure, and site was verified.   Injection Procedure Details:   Procedure diagnoses: Lumbar  radiculopathy [M54.16]    Meds Administered:  Meds ordered this encounter  Medications   methylPREDNISolone acetate (DEPO-MEDROL) injection 80 mg    Laterality: Right  Location/Site: L4  Needle:5.0 in., 22 ga.  Short bevel or Quincke spinal needle  Needle Placement: Transforaminal  Findings:    -Comments: Excellent flow of contrast along the nerve, nerve root and into the epidural space.  Procedure Details: After squaring off the end-plates to get a true AP view, the C-arm was positioned so that an oblique view of the foramen as noted above was visualized. The target area is just inferior to the "nose of the scotty dog" or sub pedicular. The soft tissues overlying this structure were infiltrated with 2-3 ml. of 1% Lidocaine without Epinephrine.  The spinal needle was inserted toward the target using a "trajectory" view along the fluoroscope beam.  Under AP and lateral visualization, the needle was advanced so it did not puncture dura and was located close the 6 O'Clock position of the pedical in AP tracterory. Biplanar projections were used to confirm position. Aspiration was confirmed to be negative for CSF and/or blood. A 1-2 ml. volume of Isovue-250 was injected and flow of contrast was noted at each level. Radiographs were obtained for documentation purposes.   After attaining the desired flow of contrast documented above, a 0.5 to 1.0 ml test dose of 0.25% Marcaine was injected into each respective transforaminal space.  The patient was observed for 90 seconds post injection.  After no sensory deficits were reported, and normal  lower extremity motor function was noted,   the above injectate was administered so that equal amounts of the injectate were placed at each foramen (level) into the transforaminal epidural space.   Additional Comments:  The patient tolerated the procedure well Dressing: 2 x 2 sterile gauze and Band-Aid    Post-procedure details: Patient was observed  during the procedure. Post-procedure instructions were reviewed.  Patient left the clinic in stable condition.    Clinical History: MRI CERVICAL SPINE WITHOUT CONTRAST   TECHNIQUE: Multiplanar, multisequence MR imaging of the cervical spine was performed. No intravenous contrast was administered.   COMPARISON:  Prior study from 05/05/2017.   FINDINGS: Alignment: Straightening with reversal of the normal cervical lordosis. 2 mm anterolisthesis of C2 on C3.   Vertebrae: Vertebral body height maintained without acute or chronic fracture. Bone marrow signal intensity within normal limits. Few scattered subcentimeter benign hemangiomata noted. No worrisome osseous lesions. No abnormal marrow edema.   Cord: Normal signal and morphology.   Posterior Fossa, vertebral arteries, paraspinal tissues: Visualized brain and posterior fossa within normal limits. Craniocervical junction normal. Postoperative scarring noted within the posterior paraspinous soft tissues. Normal flow voids seen within the vertebral arteries bilaterally.   Disc levels:   C2-C3: Trace anterolisthesis. Right eccentric disc bulge with right-sided uncovertebral and facet hypertrophy. Flattening of the ventral thecal sac without significant spinal stenosis. Moderate right C3 foraminal narrowing, progressed from previous. Left neural foramen remains patent.   C3-C4: Diffuse disc osteophyte complex with intervertebral disc space narrowing. Disc osteophyte eccentric to the right. Superimposed mild left greater than right facet hypertrophy. Flattening of the ventral thecal sac without significant spinal stenosis. Severe left with moderate right C4 foraminal narrowing.   C4-C5: Postoperative changes from prior right laminectomy and foraminotomy. Persistent right foraminal disc osteophyte complex (series 7, image 12). Additional broad posterior component flattens the ventral thecal sac with secondary mild cord  flattening. Superimposed mild facet and ligament flavum hypertrophy. Mild spinal stenosis. Severe right with mild left C5 foraminal narrowing.   C5-C6: Circumferential disc bulge with uncovertebral spurring. Broad posterior disc osteophyte flattens and effaces the ventral thecal sac resultant mild spinal stenosis. Moderate right with mild left C6 foraminal narrowing.   C6-C7: Circumferential disc bulge with left greater than right uncovertebral spurring. Flattening of the ventral thecal sac without significant spinal stenosis. Moderate left with mild right C7 foraminal narrowing.   C7-T1: Mild to moderate left greater than right facet hypertrophy. Minimal annular disc bulge. No spinal stenosis. Foramina remain patent.   Visualized upper thoracic spine demonstrates mild noncompressive disc bulging at T1-2 and T2-3.   IMPRESSION: 1. Postoperative changes from prior right laminectomy and foraminotomy at C4-5. Residual and/or recurrent right foraminal disc osteophyte complex at this level with persistent severe right C5 foraminal stenosis. 2. Right eccentric disc osteophyte complex at C3-4 with resultant severe left and moderate right C4 foraminal stenosis. 3. Advanced right-sided uncovertebral and facet degeneration at C2-3 with resultant moderate right C3 foraminal stenosis. 4. 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     Electronically Signed   By: Jeannine Boga M.D.   On: 09/11/2020 05:32     Objective:  VS:  HT:     WT:    BMI:      BP:(!) 163/79   HR:(!) 101bpm   TEMP: ( )   RESP:  Physical Exam Vitals and nursing note reviewed.  Constitutional:  General: He is not in acute distress.    Appearance: Normal appearance. He is not ill-appearing.  HENT:     Head: Normocephalic and atraumatic.     Right Ear: External ear normal.     Left Ear: External ear normal.     Nose: No congestion.   Eyes:     Extraocular Movements: Extraocular movements intact.  Cardiovascular:     Rate and Rhythm: Normal rate.     Pulses: Normal pulses.  Pulmonary:     Effort: Pulmonary effort is normal. No respiratory distress.  Abdominal:     General: There is no distension.     Palpations: Abdomen is soft.  Musculoskeletal:        General: No tenderness or signs of injury.     Cervical back: Neck supple.     Right lower leg: No edema.     Left lower leg: No edema.     Comments: Patient has good distal strength without clonus.  Skin:    Findings: No erythema or rash.  Neurological:     General: No focal deficit present.     Mental Status: He is alert and oriented to person, place, and time.     Sensory: No sensory deficit.     Motor: No weakness or abnormal muscle tone.     Coordination: Coordination normal.  Psychiatric:        Mood and Affect: Mood normal.        Behavior: Behavior normal.     Imaging: No results found.

## 2022-01-14 ENCOUNTER — Encounter: Payer: Self-pay | Admitting: Physical Medicine and Rehabilitation

## 2022-01-14 ENCOUNTER — Ambulatory Visit: Payer: Medicare HMO | Admitting: Physical Medicine and Rehabilitation

## 2022-01-14 ENCOUNTER — Other Ambulatory Visit: Payer: Self-pay

## 2022-01-14 ENCOUNTER — Encounter: Payer: Medicare HMO | Admitting: Internal Medicine

## 2022-01-14 ENCOUNTER — Ambulatory Visit: Payer: Self-pay

## 2022-01-14 VITALS — BP 118/72 | HR 84

## 2022-01-14 DIAGNOSIS — M48061 Spinal stenosis, lumbar region without neurogenic claudication: Secondary | ICD-10-CM

## 2022-01-14 DIAGNOSIS — M5416 Radiculopathy, lumbar region: Secondary | ICD-10-CM

## 2022-01-14 MED ORDER — METHYLPREDNISOLONE ACETATE 80 MG/ML IJ SUSP
80.0000 mg | Freq: Once | INTRAMUSCULAR | Status: AC
  Administered 2022-01-14: 80 mg

## 2022-01-14 NOTE — Progress Notes (Signed)
[name] - 76 y.o. male MRN [id number]  Date of birth: 25-Dec-1945  Office Visit Note: Visit Date: 01/14/2022 PCP: Lindell Spar, MD Referred by: Lindell Spar, MD  Subjective: Chief Complaint  Patient presents with   Lower Back - Pain   HPI:  Jacob Farmer is a 76 y.o. male who comes in today for follow-up evaluation management of chronic worsening severe right hip and leg pain.  He actually had come in with thoughts that we are going to complete a left-sided injection as he had a phone call to stating that the prior injection performed which was a right L4 transforaminal epidural steroid injection gave him a lot of relief on the right but his left side was bothering him.  When he comes in today however it still right-sided complaints down to the knee laterally.  No real groin pain.  No pain on rotation today.  He is a former patient and her continues to be a patient of Dr. Louanne Skye but he just has not seen them recently.  He is also followed in rheumatology.  His case is complicated by coronary artery disease as well as type 2 diabetes.  He reports some left-sided complaints but nothing like the right at this point.  We had discussed at our last office visit that if the right-sided injection did not seem to help very much or very long that we would get an MRI of the lumbar spine.  His last MRI was from 2017.  Today he still reports significant pain but states that the injection did seem to help quite a bit for about a week and a half.  At this point I think we should repeat the injection except try an L5 approach.  I also think that we should get an MRI of the lumbar spine and this is ordered today.  Patient is somewhat claustrophobic and we will use an open MRI and I will provide him with some Valium pretty imaging.  ROS Otherwise per HPI.  Assessment & Plan: Visit Diagnoses:    ICD-10-CM   1. 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  Patient's clinical course is complicated by type 2 diabetes and coronary artery disease on long-term Eliquis as well as chronic stage IV kidney disease.  He has had cervical herniated disc as well as chronic lumbar pain and symptoms.  Completed L5 transforaminal injection today and will order MRI of the lumbar spine.   Meds & Orders:  Meds ordered this encounter  Medications   methylPREDNISolone acetate (DEPO-MEDROL) injection 80 mg    Orders Placed This Encounter  Procedures   XR C-ARM NO REPORT   MR LUMBAR SPINE WO CONTRAST   Epidural Steroid injection    Follow-up: Return for MRI review after completion.   Procedures: No procedures performed  Lumbosacral Transforaminal Epidural Steroid Injection - Sub-Pedicular Approach with Fluoroscopic Guidance  Patient: Jacob Farmer      Date of Birth: 04-Nov-1946 MRN: [id number] PCP: Lindell Spar, MD      Visit Date: 01/14/2022   Universal Protocol:    Date/Time: 01/14/2022  Consent Given By: the patient  Position: PRONE  Additional Comments:  Vital signs were monitored before and after the procedure. Patient was prepped and draped in the usual sterile fashion. The correct patient, procedure, and site was verified.   Injection Procedure Details:   Procedure diagnoses: Lumbar radiculopathy [M54.16]    Meds Administered:  Meds ordered this encounter  Medications   methylPREDNISolone acetate (DEPO-MEDROL) injection 80 mg    Laterality: Right  Location/Site: L5  Needle:5.0 in., 22 ga.  Short bevel or Quincke spinal needle  Needle Placement: Transforaminal  Findings:    -Comments: Excellent flow of contrast along the nerve, nerve root and into the epidural space.  Procedure Details: After squaring off the  end-plates to get a true AP view, the C-arm was positioned so that an oblique view of the foramen as noted above was visualized. The target area is just inferior to the "nose of the scotty dog" or sub pedicular. The soft tissues overlying this structure were infiltrated with 2-3 ml. of 1% Lidocaine without Epinephrine.  The spinal needle was inserted toward the target using a "trajectory" view along the fluoroscope beam.  Under AP and lateral visualization, the needle was advanced so it did not puncture dura and was located close the 6 O'Clock position of the pedical in AP tracterory. Biplanar projections were used to confirm position. Aspiration was confirmed to be negative for CSF and/or blood. A 1-2 ml. volume of Isovue-250 was injected and flow of contrast was noted at each level. Radiographs were obtained for documentation purposes.   After attaining the desired flow of contrast documented above, a 0.5 to 1.0 ml test dose of 0.25% Marcaine was injected into each respective transforaminal space.  The patient was observed for 90 seconds post injection.  After no sensory deficits were reported, and normal lower extremity motor function was noted,   the above injectate was administered so that equal amounts of the injectate were placed at each foramen (level) into the transforaminal epidural space.   Additional Comments:  The patient tolerated the procedure well Dressing: 2 x 2 sterile gauze and Band-Aid    Post-procedure details: Patient was observed during the procedure. Post-procedure instructions were reviewed.  Patient left the clinic in stable condition.     Clinical History: MRI LUMBAR SPINE WITHOUT CONTRAST   TECHNIQUE: Multiplanar, multisequence MR imaging of the lumbar spine was performed. No intravenous contrast was administered.   COMPARISON:  Lumbar spine radiographs August 01, 2016   FINDINGS: SEGMENTATION: For the purposes of this report, the last  well-formed intervertebral disc will be described as L5-S1.   ALIGNMENT: Maintenance of the lumbar lordosis. No malalignment.   VERTEBRAE:Vertebral bodies are intact. Moderate L2-3 and L5-S1 disc height loss with decreased T2 signal within all disc compatible with mild desiccation, superimposed focal edema L2-3 disc associated with acute Schmorl's nodes. Moderate subacute on chronic discogenic endplate changes U7-6, mild at L5-S1. No suspicious bone marrow signal. Scattered old Schmorl's nodes.   CONUS MEDULLARIS: Conus medullaris terminates at T12-L1 and demonstrates normal morphology and signal characteristics. Cauda equina is normal.   PARASPINAL AND SOFT TISSUES: Included prevertebral and paraspinal soft tissues are nonacute. Ectatic infrarenal aorta incompletely characterized.   DISC LEVELS:   L1-2: No disc bulge, canal stenosis nor neural foraminal narrowing.   L2-3: Small broad-based disc bulge, mild facet arthropathy and ligamentum flavum redundancy without canal stenosis. No neural foraminal narrowing.   L3-4: Annular bulging, small RIGHT subarticular to extra foraminal disc protrusion. Mild facet arthropathy and ligamentum flavum redundancy without canal stenosis. Mild RIGHT neural  foraminal narrowing.   L4-5: Small broad-based disc bulge, LEFT subarticular annular fissure. Moderate RIGHT mild LEFT facet arthropathy and ligamentum flavum redundancy without canal stenosis. Minimal RIGHT, mild LEFT neural foraminal narrowing.   L5-S1: Moderate broad-based disc bulge, LEFT central annular fissure. Mild facet arthropathy. No canal stenosis. Moderate to severe RIGHT, severe LEFT neural foraminal narrowing.   IMPRESSION: Degenerative change of the lumbar spine.  No canal stenosis.   Neural foraminal narrowing L3-4 through L5-S1: Severe on the LEFT at L5-S1.   Multiple annular fissures.     Electronically Signed   By: Elon Alas M.D.   On: 10/17/2016  13:55     Objective:  VS:  HT:     WT:    BMI:      BP:118/72   HR:84bpm   TEMP: ( )   RESP:  Physical Exam Vitals and nursing note reviewed.  Constitutional:      General: He is not in acute distress.    Appearance: Normal appearance. He is not ill-appearing.  HENT:     Head: Normocephalic and atraumatic.     Right Ear: External ear normal.     Left Ear: External ear normal.     Nose: No congestion.  Eyes:     Extraocular Movements: Extraocular movements intact.  Cardiovascular:     Rate and Rhythm: Normal rate.     Pulses: Normal pulses.  Pulmonary:     Effort: Pulmonary effort is normal. No respiratory distress.  Abdominal:     General: There is no distension.     Palpations: Abdomen is soft.  Musculoskeletal:        General: No tenderness or signs of injury.     Cervical back: Neck supple.     Right lower leg: No edema.     Left lower leg: No edema.     Comments: Patient has good distal strength without clonus.  Skin:    Findings: No erythema or rash.  Neurological:     General: No focal deficit present.     Mental Status: He is alert and oriented to person, place, and time.     Sensory: No sensory deficit.     Motor: No weakness or abnormal muscle tone.     Coordination: Coordination normal.  Psychiatric:        Mood and Affect: Mood normal.        Behavior: Behavior normal.     Imaging: XR C-ARM NO REPORT  Result Date: 01/14/2022 Please see Notes tab for imaging impression.

## 2022-01-14 NOTE — Progress Notes (Signed)
Pt state lower pain mostly on his right side. Pt state during the middle of the night . Pt state he doesn't take anything for the pain.  Numeric Pain Rating Scale and Functional Assessment Average Pain 2   In the last MONTH (on 0-10 scale) has pain interfered with the following?  1. General activity like being  able to carry out your everyday physical activities such as walking, climbing stairs, carrying groceries, or moving a chair?  Rating(9)    +Driver, -BT, -Dye Allergies.

## 2022-01-14 NOTE — Patient Instructions (Signed)

## 2022-01-15 NOTE — Procedures (Signed)
Lumbosacral Transforaminal Epidural Steroid Injection - Sub-Pedicular Approach with Fluoroscopic Guidance  Patient: Jacob Farmer      Date of Birth: 1946/04/05 MRN: [id number] PCP: Lindell Spar, MD      Visit Date: 01/14/2022   Universal Protocol:    Date/Time: 01/14/2022  Consent Given By: the patient  Position: PRONE  Additional Comments: Vital signs were monitored before and after the procedure. Patient was prepped and draped in the usual sterile fashion. The correct patient, procedure, and site was verified.   Injection Procedure Details:   Procedure diagnoses: Lumbar radiculopathy [M54.16]    Meds Administered:  Meds ordered this encounter  Medications   methylPREDNISolone acetate (DEPO-MEDROL) injection 80 mg    Laterality: Right  Location/Site: L5  Needle:5.0 in., 22 ga.  Short bevel or Quincke spinal needle  Needle Placement: Transforaminal  Findings:    -Comments: Excellent flow of contrast along the nerve, nerve root and into the epidural space.  Procedure Details: After squaring off the end-plates to get a true AP view, the C-arm was positioned so that an oblique view of the foramen as noted above was visualized. The target area is just inferior to the "nose of the scotty dog" or sub pedicular. The soft tissues overlying this structure were infiltrated with 2-3 ml. of 1% Lidocaine without Epinephrine.  The spinal needle was inserted toward the target using a "trajectory" view along the fluoroscope beam.  Under AP and lateral visualization, the needle was advanced so it did not puncture dura and was located close the 6 O'Clock position of the pedical in AP tracterory. Biplanar projections were used to confirm position. Aspiration was confirmed to be negative for CSF and/or blood. A 1-2 ml. volume of Isovue-250 was injected and flow of contrast was noted at each level. Radiographs were obtained for documentation purposes.   After attaining the desired flow  of contrast documented above, a 0.5 to 1.0 ml test dose of 0.25% Marcaine was injected into each respective transforaminal space.  The patient was observed for 90 seconds post injection.  After no sensory deficits were reported, and normal lower extremity motor function was noted,   the above injectate was administered so that equal amounts of the injectate were placed at each foramen (level) into the transforaminal epidural space.   Additional Comments:  The patient tolerated the procedure well Dressing: 2 x 2 sterile gauze and Band-Aid    Post-procedure details: Patient was observed during the procedure. Post-procedure instructions were reviewed.  Patient left the clinic in stable condition.

## 2022-01-16 ENCOUNTER — Other Ambulatory Visit: Payer: Self-pay | Admitting: Physical Medicine and Rehabilitation

## 2022-01-16 ENCOUNTER — Telehealth: Payer: Self-pay | Admitting: Physical Medicine and Rehabilitation

## 2022-01-16 MED ORDER — DIAZEPAM 5 MG PO TABS
ORAL_TABLET | ORAL | 0 refills | Status: DC
Start: 2022-01-16 — End: 2022-03-28

## 2022-01-16 NOTE — Telephone Encounter (Signed)
Patient's wife Jacob Farmer called asked if some medication can be sent into the pharmacy prior to patient having his MRI. She advised patient need something to help him relax during the MRI.   [facility] in [place] The number to contact Jacob Farmer is [phone]

## 2022-01-22 ENCOUNTER — Encounter: Payer: Self-pay | Admitting: Internal Medicine

## 2022-01-22 ENCOUNTER — Encounter (INDEPENDENT_AMBULATORY_CARE_PROVIDER_SITE_OTHER): Payer: Self-pay

## 2022-01-22 ENCOUNTER — Other Ambulatory Visit: Payer: Self-pay

## 2022-01-22 ENCOUNTER — Ambulatory Visit (INDEPENDENT_AMBULATORY_CARE_PROVIDER_SITE_OTHER): Payer: Medicare HMO | Admitting: Internal Medicine

## 2022-01-22 VITALS — BP 128/72 | HR 92 | Resp 18 | Ht 70.0 in | Wt 187.0 lb

## 2022-01-22 DIAGNOSIS — E1100 Type 2 diabetes mellitus with hyperosmolarity without nonketotic hyperglycemic-hyperosmolar coma (NKHHC): Secondary | ICD-10-CM | POA: Diagnosis not present

## 2022-01-22 DIAGNOSIS — Z0001 Encounter for general adult medical examination with abnormal findings: Secondary | ICD-10-CM

## 2022-01-22 DIAGNOSIS — N184 Chronic kidney disease, stage 4 (severe): Secondary | ICD-10-CM | POA: Diagnosis not present

## 2022-01-22 DIAGNOSIS — I2510 Atherosclerotic heart disease of native coronary artery without angina pectoris: Secondary | ICD-10-CM

## 2022-01-22 DIAGNOSIS — B351 Tinea unguium: Secondary | ICD-10-CM | POA: Diagnosis not present

## 2022-01-22 DIAGNOSIS — I480 Paroxysmal atrial fibrillation: Secondary | ICD-10-CM | POA: Diagnosis not present

## 2022-01-22 DIAGNOSIS — I10 Essential (primary) hypertension: Secondary | ICD-10-CM

## 2022-01-22 LAB — POCT GLYCOSYLATED HEMOGLOBIN (HGB A1C)
HbA1c, POC (controlled diabetic range): 6.1 % (ref 0.0–7.0)
HbA1c, POC (prediabetic range): 6.1 % (ref 5.7–6.4)

## 2022-01-22 MED ORDER — TERBINAFINE HCL 250 MG PO TABS: 125.0000 mg | ORAL_TABLET | Freq: Every day | ORAL | 0 refills | Status: DC

## 2022-01-22 NOTE — Patient Instructions (Addendum)
Please start taking Lamisil as prescribed.  Please continue to take other medications as prescribed.  Please continue to follow low salt diet and ambulate as tolerated.  Please contact us about dates of Shingrix vaccine.

## 2022-01-24 DIAGNOSIS — M4726 Other spondylosis with radiculopathy, lumbar region: Secondary | ICD-10-CM | POA: Diagnosis not present

## 2022-01-24 DIAGNOSIS — M4727 Other spondylosis with radiculopathy, lumbosacral region: Secondary | ICD-10-CM | POA: Diagnosis not present

## 2022-01-25 DIAGNOSIS — Z0001 Encounter for general adult medical examination with abnormal findings: Secondary | ICD-10-CM | POA: Insufficient documentation

## 2022-01-25 NOTE — Assessment & Plan Note (Signed)
Lab Results  Component Value Date   HGBA1C 6.1 01/22/2022   HGBA1C 6.1 01/22/2022   Diet controlled Advised to follow diabetic diet On statin and ARB F/u CMP and lipid panel Diabetic foot exam: Today Diabetic eye exam: Advised to follow up with Ophthalmology for diabetic eye exam

## 2022-01-25 NOTE — Progress Notes (Signed)
Established Patient Office Visit  Subjective:  Patient ID: Jacob Farmer, male    DOB: 1946-09-09  Age: 76 y.o. MRN: [id number]  CC:  Chief Complaint  Patient presents with   Annual Exam    Annual exam pt has been having lower back pain at night does have mri scheduled for this week and got epidural shot last week     HPI Jacob Farmer is a 76 y.o. male with past medical history of  CAD status post stent placement, HTN, HLD, RA, CKD stage IV, prostate ca. s/p radiotherapy, gout, cervical spinal stenosis and GERD who presents for annual physical.  He complains of chronic low back pain, for which he sees Dr. Ernestina Patches.  He recently had epidural injection.  Denies any recent worsening of back pain.  CAD s/p stent placement and HTN: BP is well-controlled. Takes medications regularly. Patient denies headache, dizziness, chest pain, dyspnea or palpitations.  He follows up with cardiology.  He is on aspirin and statin.  He was recently diagnosed with A Fib, and has been placed on Eliquis now.  Denies any signs of bleeding currently.   RA: He is on [place] for it. He goes to rheumatology clinic.  He also takes allopurinol for history of gout, denies any recent flareups.   CKD D stage IV: He is on losartan.  He follows up with nephrology for it.  Denies any dysuria or hematuria currently.   He has a history of allergic dermatitis, for which he takes [place] and sees Dr. Denna Haggard.  Past Medical History:  Diagnosis Date   CAD (coronary artery disease)    Acute myocardial infarction treated with TPA in 1990; 1999-stents to circumflex and RCA; residual total occlusion of the left anterior descending; normal ejection fraction; stress nuclear in 2001-distal anteroseptal ischemia; normal LV function   CKD (chronic kidney disease) stage 4, GFR 15-29 ml/min (HCC)    First degree heart block    History of gastric ulcer    History of kidney stones    History of MI (myocardial infarction)    1990-   treated w/ TPA   Hyperlipidemia    Hypertension    Jaundice    OA (osteoarthritis)    Prediabetes    Prostate cancer (HCC)    Stage T1c , Gleason 3+4,  PSA 4.7,  vol 24cc--  scheduled for radiative seed implants   Psoriatic arthritis ([place])    RA (rheumatoid arthritis) ([place])    Dr. Toni Amend   Sinus bradycardia    Wears dentures     Past Surgical History:  Procedure Laterality Date   BIOPSY  08/27/2019   Procedure: BIOPSY;  Surgeon: Rogene Houston, MD;  [facility];  Service: Endoscopy;;  gastric   CARDIOVASCULAR STRESS TEST  2001  per Dr Lattie Haw clinic note   distal anteroseptal ischemia,  normal LVF   COLONOSCOPY  last one 2006 (approx)   CORONARY ANGIOPLASTY WITH STENT PLACEMENT  1999   DES x2  to CFX and RCA/  residual total occlusion LAD,  normal LVEF   ESOPHAGOGASTRODUODENOSCOPY (EGD) WITH PROPOFOL N/A 08/27/2019   Procedure: ESOPHAGOGASTRODUODENOSCOPY (EGD) WITH PROPOFOL;  Surgeon: Rogene Houston, MD;  [facility];  Service: Endoscopy;  Laterality: N/A;  10:10   EYE SURGERY Bilateral    cataract   POSTERIOR CERVICAL FUSION/FORAMINOTOMY N/A 05/26/2017   Procedure: RIGHT C4-5 FORAMINOTOMY WITH EXCISION OF HERNIATED [place];  Surgeon: Jessy Oto, MD;  Location: [place];  Service:  Orthopedics;  Laterality: N/A;   RADIOACTIVE SEED IMPLANT N/A 01/11/2016   Procedure: RADIOACTIVE SEED IMPLANT/BRACHYTHERAPY IMPLANT;  Surgeon: Franchot Gallo, MD;  Location[facility];  Service: Urology;  Laterality: N/A;   20  seeds implanted no seeds founds in bladder    Family History  Problem Relation Age of Onset   Heart attack Mother    Coronary artery disease Father    Ulcerative colitis Father    Ulcers Father    Breast cancer Sister    COPD Brother    Pancreatic cancer Brother    Healthy Sister    Diabetes Brother    Cirrhosis Son    Seizures Son     Social History   Socioeconomic History   Marital status: Married    Spouse name:  Not on file   Number of children: 2   Years of education: Not on file   Highest education level: Not on file  Occupational History   Occupation: Retired Clinical biochemist  Tobacco Use   Smoking status: Former    Packs/day: 1.00    Years: 30.00    Pack years: 30.00    Types: Cigarettes    Quit date: 05/21/1989    Years since quitting: 32.7   Smokeless tobacco: Never  Vaping Use   Vaping Use: Never used  Substance and Sexual Activity   Alcohol use: Not Currently    Comment: 1 beer per day   Drug use: No   Sexual activity: Not on file  Other Topics Concern   Not on file  Social History Narrative   Married for 24 years.Retired Clinical biochemist.   Social Determinants of Health   Financial Resource Strain: Low Risk    Difficulty of Paying Living Expenses: Not hard at all  Food Insecurity: No Food Insecurity   Worried About Charity fundraiser in the Last Year: Never true   [place] in the Last Year: Never true  Transportation Needs: No Transportation Needs   Lack of Transportation (Medical): No   Lack of Transportation (Non-Medical): No  Physical Activity: Sufficiently Active   Days of Exercise per Week: 7 days   Minutes of Exercise per Session: 60 min  Stress: No Stress Concern Present   Feeling of Stress : Not at all  Social Connections: Moderately Integrated   Frequency of Communication with Friends and Family: More than three times a week   Frequency of Social Gatherings with Friends and Family: More than three times a week   Attends Religious Services: More than 4 times per year   Active Member of [employer] or Organizations: No   Attends Archivist Meetings: Never   Marital Status: Married  Human resources officer Violence: Not At Risk   Fear of Current or Ex-Partner: No   Emotionally Abused: No   Physically Abused: No   Sexually Abused: No    Outpatient Medications Prior to Visit  Medication Sig Dispense Refill   Alcohol Swabs (DROPSAFE ALCOHOL PREP) 70 % PADS USE  AS NEEDED TO CLEAN SKIN PRIOR TO FINGERPRICK AND INJECTION 100 each 5   allopurinol (ZYLOPRIM) 100 MG tablet TAKE 1/2 TABLET EVERY OTHER DAY 45 tablet 0   amLODipine (NORVASC) 5 MG tablet Take 1 tablet (5 mg total) by mouth daily. 90 tablet 3   apixaban (ELIQUIS) 5 MG TABS tablet Take 1 tablet (5 mg total) by mouth 2 (two) times daily. 180 tablet 3   Blood Glucose Monitoring Suppl (ACCU-CHEK GUIDE) w/Device KIT 1 Units  by Does not apply route 2 (two) times daily. 1 kit 3   Cholecalciferol (VITAMIN D) 125 MCG (5000 UT) CAPS Take 5,000 Units by mouth daily.     Colchicine 0.6 MG CAPS Take 0.6 mg by mouth as needed.     Dupilumab (DUPIXENT) 300 MG/2ML SOPN Inject 1 application into the skin every 14 (fourteen) days. 3.92 mL 8   ferrous sulfate 325 (65 FE) MG EC tablet Take 1 tablet by mouth daily.     folic acid (FOLVITE) 709 MCG tablet Take 400 mcg by mouth daily.     glucose blood (ACCU-CHEK GUIDE) test strip Use as instructed 100 each 12   hydrALAZINE (APRESOLINE) 25 MG tablet Take 50 mg by mouth 2 (two) times daily.     Lancets (ACCU-CHEK SOFT TOUCH) lancets Use as instructed 100 each 12   leflunomide (ARAVA) 10 MG tablet Take 1 tablet (10 mg total) by mouth daily. 90 tablet 0   losartan (COZAAR) 25 MG tablet Take 25 mg by mouth daily.     nitroGLYCERIN (NITROSTAT) 0.4 MG SL tablet Place 0.4 mg under the tongue every 5 (five) minutes as needed for chest pain.      rosuvastatin (CRESTOR) 40 MG tablet TAKE 1 TABLET EVERY DAY . STOP ATORVASTATIN 90 tablet 3   triamcinolone cream (KENALOG) 0.1 % Apply 1 application topically daily as needed (rash). 454 g 3   diazepam (VALIUM) 5 MG tablet Take one tablet by mouth with food one hour prior to procedure. May repeat 30 minutes prior if needed. (Patient not taking: Reported on 01/22/2022) 2 tablet 0   Glucose Blood (BLOOD GLUCOSE TEST STRIPS) STRP Inject 100 Units into the skin 2 (two) times daily. Please provide the brand name Contour test  strips Diagnosis: E 11.9. (Patient not taking: Reported on 01/22/2022) 100 strip 3   No facility-administered medications prior to visit.    No Known Allergies  ROS Review of Systems  Constitutional:  Negative for chills and fever.  HENT:  Negative for congestion and sore throat.   Eyes:  Negative for pain and discharge.  Respiratory:  Negative for cough and shortness of breath.   Cardiovascular:  Negative for chest pain and palpitations.  Gastrointestinal:  Negative for constipation, diarrhea, nausea and vomiting.  Endocrine: Negative for polydipsia and polyuria.  Genitourinary:  Negative for dysuria and hematuria.  Musculoskeletal:  Positive for arthralgias and back pain. Negative for neck pain and neck stiffness.  Skin:  Positive for rash.  Neurological:  Negative for dizziness, weakness, numbness and headaches.  Psychiatric/Behavioral:  Negative for agitation and behavioral problems.      Objective:    Physical Exam Vitals reviewed.  Constitutional:      General: He is not in acute distress.    Appearance: He is not diaphoretic.  HENT:     Head: Normocephalic and atraumatic.     Nose: Nose normal.     Mouth/Throat:     Mouth: Mucous membranes are moist.  Eyes:     General: No scleral icterus.    Extraocular Movements: Extraocular movements intact.  Cardiovascular:     Rate and Rhythm: Normal rate and regular rhythm.     Pulses: Normal pulses.     Heart sounds: Normal heart sounds. No murmur heard. Pulmonary:     Breath sounds: Normal breath sounds. No wheezing or rales.  Abdominal:     Palpations: Abdomen is soft.     Tenderness: There is no abdominal tenderness.  Musculoskeletal:  Cervical back: Neck supple. No tenderness.     Right lower leg: No edema.     Left lower leg: No edema.  Feet:     Right foot:     Toenail Condition: Right toenails are abnormally thick. Fungal disease present.    Left foot:     Toenail Condition: Left toenails are abnormally  thick. Fungal disease present. Skin:    General: Skin is warm.     Findings: Rash (Diffuse eczematous and dry, scaly patches over LE) present.  Neurological:     General: No focal deficit present.     Mental Status: He is alert and oriented to person, place, and time.     Cranial Nerves: No cranial nerve deficit.     Sensory: No sensory deficit.     Motor: No weakness.  Psychiatric:        Mood and Affect: Mood normal.        Behavior: Behavior normal.    BP 128/72 (BP Location: Left Arm, Patient Position: Sitting, Cuff Size: Normal)    Pulse 92    Resp 18    Ht 5' 10"  (1.778 m)    Wt 187 lb (84.8 kg)    SpO2 99%    BMI 26.83 kg/m  Wt Readings from Last 3 Encounters:  01/22/22 187 lb (84.8 kg)  11/19/21 187 lb 6.4 oz (85 kg)  11/13/21 186 lb (84.4 kg)    Lab Results  Component Value Date   TSH 2.52 08/08/2020   Lab Results  Component Value Date   WBC 5.0 11/01/2021   HGB 13.0 (L) 11/01/2021   HCT 41.0 11/01/2021   MCV 89.5 11/01/2021   PLT 166 11/01/2021   Lab Results  Component Value Date   NA 140 11/01/2021   K 4.8 11/01/2021   CO2 26 11/01/2021   GLUCOSE 139 (H) 11/01/2021   BUN 29 (H) 11/01/2021   CREATININE 2.36 (H) 11/01/2021   BILITOT 0.4 11/01/2021   ALKPHOS 254 (H) 11/06/2020   AST 39 (H) 11/01/2021   ALT 36 11/01/2021   PROT 6.6 11/01/2021   ALBUMIN 2.9 (L) 11/06/2020   CALCIUM 9.2 11/01/2021   ANIONGAP 8 11/02/2020   EGFR 28 (L) 11/01/2021   Lab Results  Component Value Date   CHOL 113 06/14/2021   Lab Results  Component Value Date   HDL 49 06/14/2021   Lab Results  Component Value Date   LDLCALC 48 06/14/2021   Lab Results  Component Value Date   TRIG 82 06/14/2021   Lab Results  Component Value Date   CHOLHDL 2.3 06/14/2021   Lab Results  Component Value Date   HGBA1C 6.1 01/22/2022   HGBA1C 6.1 01/22/2022      Assessment & Plan:   Problem List Items Addressed This Visit       Cardiovascular and Mediastinum   Essential  hypertension    BP Readings from Last 1 Encounters:  01/22/22 128/72  Well-controlled with Amlodipine, Losartan and hydralazine Counseled for compliance with the medications Advised DASH diet and moderate exercise/walking as tolerated      Coronary artery disease involving native coronary artery of native heart without angina pectoris    S/p stent placement in 1999 On aspirin and statin Followed by Cardiology      Paroxysmal atrial fibrillation ([place])    Recently diagnosed In sinus rhythm currently, rate controlled On Eliquis now        Endocrine   Type 2 diabetes mellitus with hyperosmolarity  without coma, without long-term current use of insulin ([place])    Lab Results  Component Value Date   HGBA1C 6.1 01/22/2022   HGBA1C 6.1 01/22/2022  Diet controlled Advised to follow diabetic diet On statin and ARB F/u CMP and lipid panel Diabetic foot exam: Today Diabetic eye exam: Advised to follow up with Ophthalmology for diabetic eye exam      Relevant Orders   POCT glycosylated hemoglobin (Hb A1C) (Completed)     Genitourinary   Chronic kidney disease, stage 4 (severe) (HCC)    On losartan Avoid nephrotoxic agents including NSAIDs Followed by nephrology        Other   Encounter for general adult medical examination with abnormal findings - Primary    Physical exam as documented. Counseling done  re healthy lifestyle involving commitment to 150 minutes exercise per week, heart healthy diet, and attaining healthy weight.The importance of adequate sleep also discussed. Changes in health habits are decided on by the patient with goals and time frames  set for achieving them. Immunization and cancer screening needs are specifically addressed at this visit.      Other Visit Diagnoses     Onychomycosis       Relevant Medications   terbinafine (LAMISIL) 250 MG tablet       Meds ordered this encounter  Medications   terbinafine (LAMISIL) 250 MG tablet    Sig: Take  0.5 tablets (125 mg total) by mouth daily.    Dispense:  45 tablet    Refill:  0    Follow-up: Return in about 6 months (around 07/22/2022) for HTN and CKD.    Lindell Spar, MD

## 2022-01-25 NOTE — Assessment & Plan Note (Signed)

## 2022-01-25 NOTE — Assessment & Plan Note (Signed)
BP Readings from Last 1 Encounters:  01/22/22 128/72   Well-controlled with Amlodipine, Losartan and hydralazine Counseled for compliance with the medications Advised DASH diet and moderate exercise/walking as tolerated

## 2022-01-25 NOTE — Assessment & Plan Note (Signed)
S/p stent placement in 1999 On aspirin and statin Followed by Cardiology 

## 2022-01-25 NOTE — Assessment & Plan Note (Signed)
On losartan Avoid nephrotoxic agents including NSAIDs Followed by nephrology

## 2022-01-25 NOTE — Assessment & Plan Note (Signed)
Recently diagnosed In sinus rhythm currently, rate controlled On Eliquis now

## 2022-02-05 ENCOUNTER — Telehealth: Payer: Self-pay | Admitting: Rheumatology

## 2022-02-05 ENCOUNTER — Telehealth: Payer: Self-pay

## 2022-02-05 NOTE — Telephone Encounter (Signed)
Pt stop by to ask if he should make an OV with [place] for MRI review or what the next step. Pt MRI in care everywhere, please advise.

## 2022-02-05 NOTE — Telephone Encounter (Signed)
Patients wife, [name], called the office requesting the patients lab orders be sent to [facility] in [place] on [address]. [name] stated that her husband plans on going on 2/28 to have his labs done. Advised her to call the day before he goes to ensure that they have the orders and haven't tossed them. [name] states she will call back on Monday 2/27 to have them sent.

## 2022-02-05 NOTE — Telephone Encounter (Signed)
Noted  

## 2022-02-07 ENCOUNTER — Encounter: Payer: Self-pay | Admitting: Physical Medicine and Rehabilitation

## 2022-02-07 ENCOUNTER — Ambulatory Visit (INDEPENDENT_AMBULATORY_CARE_PROVIDER_SITE_OTHER): Payer: Medicare HMO | Admitting: Physical Medicine and Rehabilitation

## 2022-02-07 ENCOUNTER — Other Ambulatory Visit: Payer: Self-pay

## 2022-02-07 VITALS — BP 131/73 | HR 83

## 2022-02-07 DIAGNOSIS — M4726 Other spondylosis with radiculopathy, lumbar region: Secondary | ICD-10-CM | POA: Diagnosis not present

## 2022-02-07 DIAGNOSIS — M48061 Spinal stenosis, lumbar region without neurogenic claudication: Secondary | ICD-10-CM

## 2022-02-07 DIAGNOSIS — M47816 Spondylosis without myelopathy or radiculopathy, lumbar region: Secondary | ICD-10-CM

## 2022-02-07 DIAGNOSIS — M5416 Radiculopathy, lumbar region: Secondary | ICD-10-CM

## 2022-02-07 NOTE — Progress Notes (Signed)
Pt here for an MRI review. Pt has hx of inj on 01/14/22 pt state he feels like it helped.  Numeric Pain Rating Scale and Functional Assessment Average Pain 8 Pain Right Now 2 My pain is intermittent Pain is worse with: some activites and laying down Pain improves with: rest, heat/ice, medication, and injections   In the last MONTH (on 0-10 scale) has pain interfered with the following?  1. General activity like being  able to carry out your everyday physical activities such as walking, climbing stairs, carrying groceries, or moving a chair?  Rating(4)  2. Relation with others like being able to carry out your usual social activities and roles such as  activities at home, at work and in your community. Rating(5)  3. Enjoyment of life such that you have  been bothered by emotional problems such as feeling anxious, depressed or irritable?  Rating(6)

## 2022-02-07 NOTE — Progress Notes (Signed)
Jacob Farmer - 76 y.o. male MRN [id number]  Date of birth: July 20, 1946  Office Visit Note: Visit Date: 02/07/2022 PCP: Lindell Spar, MD Referred by: Lindell Spar, MD  Subjective: Chief Complaint  Patient presents with   Lower Back - Pain   HPI: Jacob Farmer is a 76 y.o. male who comes in today for evaluation of chronic bilateral lower back pain radiating down right leg.  Patient states significant and sustained pain relief after right L5 transforaminal epidural steroid injection on 01/14/2022.  Patient reports complete resolution of right-sided radicular symptoms.  Patient states he does have days where his lower back pain is exacerbated after movement and activity, however he is able to manage his pain with home exercise regimen, use of medications and TENS unit.  Patient denies pain at this time. Patient states he has recently started walking several miles each day and this seems to be helping to alleviate his pain significantly. Patient states he is now sleeping at night without any issues. Patient's recent lumbar MRI exhibits multilevel facet arthrosis, most prominent at L5-S1. Grade 1 retrolisthesis of L2 on L3 and L5 on S1, mild to moderate spinal canal stenosis at L3-L4. No high-grade spinal canal stenosis noted. Overall, patient states he is doing much better and is able to complete daily tasks without any issues. Patient denies focal weakness, numbness and tingling. Patient denies recent trauma or falls.  Review of Systems  Musculoskeletal:  Positive for back pain.  Neurological:  Negative for tingling, sensory change, focal weakness and weakness.  All other systems reviewed and are negative. Otherwise per HPI.  Assessment & Plan: Visit Diagnoses:    ICD-10-CM   1. Lumbar radiculopathy  M54.16     2. Other spondylosis with radiculopathy, lumbar region  M47.26     3. Facet hypertrophy of lumbar region  M47.816     4. Foraminal stenosis of lumbar region  M48.061         Plan: Findings:  Chronic bilateral lower back pain radiating down right leg. Patient continues to have significant and sustained pain relief from recent right L5 transforaminal epidural steroid injection in January.  Patient has complete resolution of right-sided radicular symptoms. We did discuss patient's recent lumbar MRI with him today in detail using report and spine model. There were no significant changes noted compared to lumbar MRI from 2017. We believe the next step is continue to monitor patient closely. We encouraged him to remain active and to continue home exercise program as tolerated. We instructed patient to follow-up with [place] as needed. No red flag symptoms noted upon exam today.    Meds & Orders: No orders of the defined types were placed in this encounter.  No orders of the defined types were placed in this encounter.   Follow-up: Return if symptoms worsen or fail to improve.   Procedures: No procedures performed      Clinical History: MRI lumbar spine:   INDICATION: Lumbar radiculopathy.   TECHNIQUE: Sagittal and axial T1 and T2-weighted sequences were performed. Additional sagittal STIR images were performed.   COMPARISON: None available at time of dictation.   FINDINGS:  There are 5 nonrib-bearing lumbar-type vertebrae. The conus medullaris terminates at a normal level. Grade 1 retrolisthesis of L2 on L3 and of L5 on S1. Severe L2-L3 intervertebral disc height loss with moderate height loss at L5-S1. The vertebral body heights are maintained. Edematous endplate signal changes at L2-L3 and less so at L3-4, nonspecific but  likely degenerative in etiology. Colonic diverticulosis.   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   L1-2: No significant spinal canal or neural foraminal stenosis.  L2-3: Disc bulge contributes to moderate spinal canal stenosis. Disc bulge and facet arthrosis contribute to mild/moderate left  neural foraminal stenosis.  L3-4: Disc bulge contributes to mild/moderate spinal canal stenosis and along with facet arthrosis contributes to mild left and moderate right neural foraminal stenosis. Disc bulge. No significant spinal canal stenosis. Mild/moderate left and mild right neural foraminal stenosis. Facet arthrosis.  L4-5: Disc bulge. No significant spinal canal stenosis. Posterior marginal osteophytosis and facet arthrosis contribute to severe bilateral neural foraminal stenosis.  L5-S1: No significant spinal canal or neural foraminal stenosis.    IMPRESSION:  Degenerative changes of the lumbar spine most prominent at L5-S1.   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 A CTA would better assess this.   Electronically Signed by: Linden Dolin on 01/25/2022 10:35 AM --------  MRI LUMBAR SPINE WITHOUT CONTRAST   TECHNIQUE: Multiplanar, multisequence MR imaging of the lumbar spine was performed. No intravenous contrast was administered.   COMPARISON:  Lumbar spine radiographs August 01, 2016   FINDINGS: SEGMENTATION: For the purposes of this report, the last well-formed intervertebral disc will be described as L5-S1.   ALIGNMENT: Maintenance of the lumbar lordosis. No malalignment.   VERTEBRAE:Vertebral bodies are intact. Moderate L2-3 and L5-S1 disc height loss with decreased T2 signal within all disc compatible with mild desiccation, superimposed focal edema L2-3 disc associated with acute Schmorl's nodes. Moderate subacute on chronic discogenic endplate changes Y1-9, mild at L5-S1. No suspicious bone marrow signal. Scattered old Schmorl's nodes.   CONUS MEDULLARIS: Conus medullaris terminates at T12-L1 and demonstrates normal morphology and signal characteristics. Cauda equina is normal.   PARASPINAL AND SOFT TISSUES: Included prevertebral and paraspinal soft tissues are nonacute. Ectatic infrarenal aorta  incompletely characterized.   DISC LEVELS:   L1-2: No disc bulge, canal stenosis nor neural foraminal narrowing.   L2-3: Small broad-based disc bulge, mild facet arthropathy and ligamentum flavum redundancy without canal stenosis. No neural foraminal narrowing.   L3-4: Annular bulging, small RIGHT subarticular to extra foraminal disc protrusion. Mild facet arthropathy and ligamentum flavum redundancy without canal stenosis. Mild RIGHT neural foraminal narrowing.   L4-5: Small broad-based disc bulge, LEFT subarticular annular fissure. Moderate RIGHT mild LEFT facet arthropathy and ligamentum flavum redundancy without canal stenosis. Minimal RIGHT, mild LEFT neural foraminal narrowing.   L5-S1: Moderate broad-based disc bulge, LEFT central annular fissure. Mild facet arthropathy. No canal stenosis. Moderate to severe RIGHT, severe LEFT neural foraminal narrowing.   IMPRESSION: Degenerative change of the lumbar spine.  No canal stenosis.   Neural foraminal narrowing L3-4 through L5-S1: Severe on the LEFT at L5-S1.   Multiple annular fissures.     Electronically Signed By: Elon Alas M.D. On: 10/17/2016 13:55   He reports that he quit smoking about 32 years ago. His smoking use included cigarettes. He has a 30.00 pack-year smoking history. He has never used smokeless tobacco.  Recent Labs    06/11/21 1142 06/14/21 1322 11/01/21 1009 01/22/22 1057  HGBA1C  --  5.4  --  6.1   6.1  LABURIC 5.6  --  5.5  --     Objective:  VS:  HT:     WT:    BMI:      BP:131/73   HR:83bpm   TEMP: ( )  RESP:  Physical Exam Vitals and nursing note reviewed.  HENT:     Head: Normocephalic and atraumatic.     Right Ear: External ear normal.     Left Ear: External ear normal.     Nose: Nose normal.     Mouth/Throat:     Mouth: Mucous membranes are moist.  Eyes:     Extraocular Movements: Extraocular movements intact.  Cardiovascular:     Rate and Rhythm: Normal rate.      Pulses: Normal pulses.  Pulmonary:     Effort: Pulmonary effort is normal.  Abdominal:     General: Abdomen is flat. There is no distension.  Musculoskeletal:        General: Tenderness present.     Cervical back: Normal range of motion.     Comments: Pt is slow to rise from seated position to standing. Good lumbar range of motion. Strong distal strength without clonus, no pain upon palpation of greater trochanters. Sensation intact bilaterally. Walks independently, gait steady.   Skin:    General: Skin is warm and dry.     Capillary Refill: Capillary refill takes less than 2 seconds.  Neurological:     General: No focal deficit present.     Mental Status: He is alert and oriented to person, place, and time.  Psychiatric:        Mood and Affect: Mood normal.        Behavior: Behavior normal.    Ortho Exam  Imaging: No results found.  Past Medical/Family/Surgical/Social History: Medications & Allergies reviewed per EMR, new medications updated. Patient Active Problem List   Diagnosis Date Noted   Encounter for general adult medical examination with abnormal findings 01/25/2022   Paroxysmal atrial fibrillation ([place]) 01/22/2022   Allergic dermatitis 09/12/2021   History of gastric ulcer    Herniated cervical disc 05/26/2017    Class: Acute   Cervical spinal stenosis 05/26/2017   Primary osteoarthritis of both hands 02/04/2017   History of gout 02/04/2017   DDD (degenerative disc disease), lumbar 02/04/2017   High risk medication use 01/28/2017   History of prostate cancer 01/28/2017   Chronic kidney disease, stage 4 (severe) ([place]) 12/27/2011   Type 2 diabetes mellitus with hyperosmolarity without coma, without long-term current use of insulin ([place]) 12/27/2011   Rheumatoid arthritis ([place]) 01/04/2011   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 10/02/2010   SINUS BRADYCARDIA 01/12/2010   Coronary artery disease involving native coronary artery of  native heart without angina pectoris 01/12/2010   Hyperlipidemia 01/08/2010   Essential hypertension 01/08/2010   Past Medical History:  Diagnosis Date   CAD (coronary artery disease)    Acute myocardial infarction treated with TPA in 1990; 1999-stents to circumflex and RCA; residual total occlusion of the left anterior descending; normal ejection fraction; stress nuclear in 2001-distal anteroseptal ischemia; normal LV function   CKD (chronic kidney disease) stage 4, GFR 15-29 ml/min (HCC)    First degree heart block    History of gastric ulcer    History of kidney stones    History of MI (myocardial infarction)    1990-  treated w/ TPA   Hyperlipidemia    Hypertension    Jaundice    OA (osteoarthritis)    Prediabetes    Prostate cancer (HCC)    Stage T1c , Gleason 3+4,  PSA 4.7,  vol 24cc--  scheduled for radiative seed implants   Psoriatic arthritis ([place])    RA (rheumatoid arthritis) ([place])  Dr. Toni Amend   Sinus bradycardia    Wears dentures    Family History  Problem Relation Age of Onset   Heart attack Mother    Coronary artery disease Father    Ulcerative colitis Father    Ulcers Father    Breast cancer Sister    COPD Brother    Pancreatic cancer Brother    Healthy Sister    Diabetes Brother    Cirrhosis Son    Seizures Son    Past Surgical History:  Procedure Laterality Date   BIOPSY  08/27/2019   Procedure: BIOPSY;  Surgeon: Rogene Houston, MD;  [facility];  Service: Endoscopy;;  gastric   CARDIOVASCULAR STRESS TEST  2001  per Dr Lattie Haw clinic note   distal anteroseptal ischemia,  normal LVF   COLONOSCOPY  last one 2006 (approx)   [place]   DES x2  to CFX and RCA/  residual total occlusion LAD,  normal LVEF   ESOPHAGOGASTRODUODENOSCOPY (EGD) WITH PROPOFOL N/A 08/27/2019   Procedure: ESOPHAGOGASTRODUODENOSCOPY (EGD) WITH PROPOFOL;  Surgeon: Rogene Houston, MD;  [facility];  Service:  Endoscopy;  Laterality: N/A;  10:10   EYE SURGERY Bilateral    cataract   POSTERIOR CERVICAL FUSION/FORAMINOTOMY N/A 05/26/2017   Procedure: RIGHT C4-5 FORAMINOTOMY WITH EXCISION OF HERNIATED [place];  Surgeon: Jessy Oto, MD;  Location: [facility];  Service: Orthopedics;  Laterality: N/A;   RADIOACTIVE SEED IMPLANT N/A 01/11/2016   Procedure: RADIOACTIVE SEED IMPLANT/BRACHYTHERAPY IMPLANT;  Surgeon: Franchot Gallo, MD;  Location[facility];  Service: Urology;  Laterality: N/A;   6  seeds implanted no seeds founds in bladder   Social History   Occupational History   Occupation: Retired Clinical biochemist  Tobacco Use   Smoking status: Former    Packs/day: 1.00    Years: 30.00    Pack years: 30.00    Types: Cigarettes    Quit date: 05/21/1989    Years since quitting: 32.7   Smokeless tobacco: Never  Vaping Use   Vaping Use: Never used  Substance and Sexual Activity   Alcohol use: Not Currently    Comment: 1 beer per day   Drug use: No   Sexual activity: Not on file

## 2022-02-11 ENCOUNTER — Other Ambulatory Visit: Payer: Self-pay | Admitting: *Deleted

## 2022-02-11 DIAGNOSIS — Z79899 Other long term (current) drug therapy: Secondary | ICD-10-CM

## 2022-02-11 NOTE — Telephone Encounter (Signed)
Lab Orders released.  

## 2022-02-12 DIAGNOSIS — Z79899 Other long term (current) drug therapy: Secondary | ICD-10-CM | POA: Diagnosis not present

## 2022-02-12 LAB — CBC WITH DIFFERENTIAL/PLATELET
Absolute Monocytes: 647 cells/uL (ref 200–950)
Basophils Absolute: 10 cells/uL (ref 0–200)
Basophils Relative: 0.2 %
Eosinophils Absolute: 221 cells/uL (ref 15–500)
Eosinophils Relative: 4.5 %
HCT: 39.4 % (ref 38.5–50.0)
Hemoglobin: 12.6 g/dL — ABNORMAL LOW (ref 13.2–17.1)
Lymphs Abs: 784 cells/uL — ABNORMAL LOW (ref 850–3900)
MCH: 28.6 pg (ref 27.0–33.0)
MCHC: 32 g/dL (ref 32.0–36.0)
MCV: 89.5 fL (ref 80.0–100.0)
MPV: 11.5 fL (ref 7.5–12.5)
Monocytes Relative: 13.2 %
Neutro Abs: 3239 cells/uL (ref 1500–7800)
Neutrophils Relative %: 66.1 %
Platelets: 149 10*3/uL (ref 140–400)
RBC: 4.4 10*6/uL (ref 4.20–5.80)
RDW: 13.6 % (ref 11.0–15.0)
Total Lymphocyte: 16 %
WBC: 4.9 10*3/uL (ref 3.8–10.8)

## 2022-02-12 LAB — COMPLETE METABOLIC PANEL WITH GFR
AG Ratio: 1.5 (calc) (ref 1.0–2.5)
ALT: 97 U/L — ABNORMAL HIGH (ref 9–46)
AST: 45 U/L — ABNORMAL HIGH (ref 10–35)
Albumin: 3.8 g/dL (ref 3.6–5.1)
Alkaline phosphatase (APISO): 110 U/L (ref 35–144)
BUN/Creatinine Ratio: 12 (calc) (ref 6–22)
BUN: 29 mg/dL — ABNORMAL HIGH (ref 7–25)
CO2: 26 mmol/L (ref 20–32)
Calcium: 9.1 mg/dL (ref 8.6–10.3)
Chloride: 108 mmol/L (ref 98–110)
Creat: 2.36 mg/dL — ABNORMAL HIGH (ref 0.70–1.28)
Globulin: 2.5 g/dL (calc) (ref 1.9–3.7)
Glucose, Bld: 78 mg/dL (ref 65–139)
Potassium: 4.5 mmol/L (ref 3.5–5.3)
Sodium: 140 mmol/L (ref 135–146)
Total Bilirubin: 0.4 mg/dL (ref 0.2–1.2)
Total Protein: 6.3 g/dL (ref 6.1–8.1)
eGFR: 28 mL/min/{1.73_m2} — ABNORMAL LOW (ref >=60)

## 2022-02-12 NOTE — Progress Notes (Signed)
°  I participated today with the patient's evaluation and treatment plan along with Barnet Pall, FNP

## 2022-02-13 MED ORDER — DUPIXENT 300 MG/2ML ~~LOC~~ SOAJ: SUBCUTANEOUS | 4 refills | Status: DC

## 2022-02-13 NOTE — Progress Notes (Signed)
Creatinine is elevated and stable.  Liver functions are elevated.  Most likely due to Crestor.  Please advise patient avoid all NSAIDs and alcohol use. Mar[place] Kitchen

## 2022-02-13 NOTE — Addendum Note (Signed)
Addended by: Sheran Lawless on: 02/13/2022 02:44 PM ? ? Modules accepted: Orders ? ?

## 2022-02-15 ENCOUNTER — Other Ambulatory Visit: Payer: Self-pay | Admitting: Dermatology

## 2022-02-25 ENCOUNTER — Other Ambulatory Visit: Payer: Self-pay | Admitting: Physician Assistant

## 2022-02-25 NOTE — Telephone Encounter (Signed)
Next Visit: 04/09/2022 ?  ?Last Visit: 11/13/2021 ?  ?Last Fill: 11/13/2021 ? ?DX: Idiopathic chronic gout of multiple sites without tophus  ? ?Current Dose per office note 11/13/2021: allopurinol 50 mg every other day ? ?Labs: 02/12/2022 Creatinine is elevated and stable.  Liver functions are elevated.   ? ?Okay to refill Allopurinol?  ?

## 2022-02-28 ENCOUNTER — Encounter (INDEPENDENT_AMBULATORY_CARE_PROVIDER_SITE_OTHER): Payer: Self-pay

## 2022-02-28 ENCOUNTER — Ambulatory Visit (INDEPENDENT_AMBULATORY_CARE_PROVIDER_SITE_OTHER): Payer: Medicare HMO | Admitting: Gastroenterology

## 2022-02-28 ENCOUNTER — Encounter (INDEPENDENT_AMBULATORY_CARE_PROVIDER_SITE_OTHER): Payer: Self-pay | Admitting: Gastroenterology

## 2022-02-28 ENCOUNTER — Telehealth (INDEPENDENT_AMBULATORY_CARE_PROVIDER_SITE_OTHER): Payer: Self-pay

## 2022-02-28 ENCOUNTER — Other Ambulatory Visit: Payer: Self-pay

## 2022-02-28 ENCOUNTER — Other Ambulatory Visit (INDEPENDENT_AMBULATORY_CARE_PROVIDER_SITE_OTHER): Payer: Self-pay

## 2022-02-28 VITALS — BP 160/72 | HR 78 | Temp 97.9°F | Ht 70.0 in | Wt 186.9 lb

## 2022-02-28 DIAGNOSIS — D509 Iron deficiency anemia, unspecified: Secondary | ICD-10-CM | POA: Diagnosis not present

## 2022-02-28 DIAGNOSIS — R7401 Elevation of levels of liver transaminase levels: Secondary | ICD-10-CM | POA: Diagnosis not present

## 2022-02-28 MED ORDER — PEG 3350-KCL-NA BICARB-NACL 420 G PO SOLR: 4000.0000 mL | ORAL | 0 refills | Status: DC

## 2022-02-28 NOTE — Telephone Encounter (Signed)
Allure Greaser Ann Mak Bonny, CMA  ?

## 2022-02-28 NOTE — Progress Notes (Signed)
? ?Referring Provider: Lindell Spar, MD ?Primary Care Physician:  Lindell Spar, MD ?Primary GI Physician: castaneda ? ?Chief Complaint  ?Patient presents with  ? low hemoglobin  ?  Patient arrives with wife [name]. Referred for low hemoglobin. Taking iron every other day.   ? ?HPI:   ?Jacob Farmer is a 76 y.o. male with past medical history of MI and CAD s/p stents x2, prostate cancer s/p radiation therapy, RA, history of kidney stones, hyperlipidemia, hypertension, prediabetes, CKD stage 4 ? ?Patient presenting today for iron deficiency anemia. ? ?Last seen 11/08/20 for elevated LFTs.  he had borderline ASMA elevation and very low titer ANA but IgG was normal. Elevation though secondary to a virus that spontaneously resolved as LFTs improved thereafter. Patient did have partial iron deficiency anemia at that time and was recommended to have EGD and colonoscopy but was lost to follow up for these.  ? ?Labs done on 01/02/22 with hgb 12.6, Ferritin 76, TIBC 339, iron saturation 18 and iron 62, folate >24, MCV 89.2-started on ferrous sulfate daily by nephrologist.  ? ?Patient states that he was previously scheduled for EGD and colonoscopy in the past, there was an outbreak of covid at the hospital and his procedures were cancelled. He states that he is not feeling fatigued, sob or dizzy.  Having a BM 1-2 times per day. Has some diarrhea every now and then, cannot seem to pinpoint any precipitating factors. Maybe occurring 2-3x/month, lasting a day at a time. He denies any associated symptoms that occur with it. He is taking his iron pills every other day now, denies any rectal bleeding, having darker stools now but none prior to starting iron. States that he has had IDA in the past, maybe 20 years ago, he had EGD and colonoscopy without any significant findings to explain his IDA. Does have hx of bleeding ulcer in the past, he does not take any NSAIDs currently. He has no abdominal pain, early satiety,  dysphagia, odynophagia, bloating, weight loss or appetite changes. ? ?Notably LFTs were elevated 02/12/22 with AST 45 and ALT 97. Tells me that he had been taking terbinafine for a toenail fungus when those were drawn, he denies any other changes to medications otherwise.  ? ?No red flag symptoms. Patient denies melena, hematochezia, nausea, vomiting, diarrhea, constipation, dysphagia, odyonophagia, early satiety or weight loss.  ? ?Last Colonoscopy:2006? ? ?Last Endoscopy:08/27/19- Normal esophagus. ?- Z-line irregular, 40 cm from the incisors. ?- Gastritis. ?- Non-bleeding gastric ulcer. Biopsied. ?- Normal duodenal bulb and second portion of the duodenum. ? ?Recommendations:  ? ?Past Medical History:  ?Diagnosis Date  ? CAD (coronary artery disease)   ? Acute myocardial infarction treated with TPA in 1990; 1999-stents to circumflex and RCA; residual total occlusion of the left anterior descending; normal ejection fraction; stress nuclear in 2001-distal anteroseptal ischemia; normal LV function  ? CKD (chronic kidney disease) stage 4, GFR 15-29 ml/min (HCC)   ? First degree heart block   ? History of gastric ulcer   ? History of kidney stones   ? History of MI (myocardial infarction)   ? 1990-  treated w/ TPA  ? Hyperlipidemia   ? Hypertension   ? Jaundice   ? OA (osteoarthritis)   ? Prediabetes   ? Prostate cancer ([place])   ? Stage T1c , Gleason 3+4,  PSA 4.7,  vol 24cc--  scheduled for radiative seed implants  ? Psoriatic arthritis ([place])   ? RA (rheumatoid arthritis) ([place])   ?  Dr. Toni Amend  ? Sinus bradycardia   ? Wears dentures   ? ? ?Past Surgical History:  ?Procedure Laterality Date  ? BIOPSY  08/27/2019  ? Procedure: BIOPSY;  Surgeon: Rogene Houston, MD;  [facility];  Service: Endoscopy;;  gastric  ? CARDIOVASCULAR STRESS TEST  2001  per Dr Lattie Haw clinic note  ? distal anteroseptal ischemia,  normal LVF  ? COLONOSCOPY  last one 2006 (approx)  ? CORONARY ANGIOPLASTY WITH STENT PLACEMENT  1999  ?  DES x2  to CFX and RCA/  residual total occlusion LAD,  normal LVEF  ? ESOPHAGOGASTRODUODENOSCOPY (EGD) WITH PROPOFOL N/A 08/27/2019  ? Procedure: ESOPHAGOGASTRODUODENOSCOPY (EGD) WITH PROPOFOL;  Surgeon: Rogene Houston, MD;  [facility];  Service: Endoscopy;  Laterality: N/A;  10:10  ? EYE SURGERY Bilateral   ? cataract  ? POSTERIOR CERVICAL FUSION/FORAMINOTOMY N/A 05/26/2017  ? Procedure: RIGHT C4-5 FORAMINOTOMY WITH EXCISION OF HERNIATED [place];  Surgeon: Jessy Oto, MD;  Location: [place];  Service: Orthopedics;  Laterality: N/A;  ? RADIOACTIVE SEED IMPLANT N/A 01/11/2016  ? Procedure: RADIOACTIVE SEED IMPLANT/BRACHYTHERAPY IMPLANT;  Surgeon: Franchot Gallo, MD;  Location[facility];  Service: Urology;  Laterality: N/A;   73  seeds implanted ?no seeds founds in bladder  ? ? ?Current Outpatient Medications  ?Medication Sig Dispense Refill  ? Alcohol Swabs (DROPSAFE ALCOHOL PREP) 70 % PADS USE AS NEEDED TO CLEAN SKIN PRIOR TO FINGERPRICK AND INJECTION 100 each 5  ? allopurinol (ZYLOPRIM) 100 MG tablet TAKE 1/2 TABLET EVERY OTHER DAY 45 tablet 0  ? amLODipine (NORVASC) 5 MG tablet Take 1 tablet (5 mg total) by mouth daily. 90 tablet 3  ? apixaban (ELIQUIS) 5 MG TABS tablet Take 1 tablet (5 mg total) by mouth 2 (two) times daily. 180 tablet 3  ? Blood Glucose Monitoring Suppl (ACCU-CHEK GUIDE) w/Device KIT 1 Units by Does not apply route 2 (two) times daily. 1 kit 3  ? Colchicine 0.6 MG CAPS Take 0.6 mg by mouth as needed.    ? Dupilumab (DUPIXENT) 300 MG/2ML SOPN Inject 1 application into the skin every 14 (fourteen) days. 300 mL 4  ? ferrous sulfate 325 (65 FE) MG EC tablet Take 1 tablet by mouth daily.    ? folic acid (FOLVITE) 389 MCG tablet Take 400 mcg by mouth daily.    ? glucose blood (ACCU-CHEK GUIDE) test strip Use as instructed 100 each 12  ? hydrALAZINE (APRESOLINE) 25 MG tablet Take 50 mg by mouth 2 (two) times daily.    ? Lancets (ACCU-CHEK SOFT TOUCH) lancets Use as  instructed 100 each 12  ? leflunomide (ARAVA) 10 MG tablet Take 1 tablet (10 mg total) by mouth daily. 90 tablet 0  ? losartan (COZAAR) 25 MG tablet Take 25 mg by mouth daily.    ? nitroGLYCERIN (NITROSTAT) 0.4 MG SL tablet Place 0.4 mg under the tongue every 5 (five) minutes as needed for chest pain.     ? rosuvastatin (CRESTOR) 40 MG tablet TAKE 1 TABLET EVERY DAY . STOP ATORVASTATIN 90 tablet 3  ? triamcinolone cream (KENALOG) 0.1 % APPLY 1 APPLICATION TOPICALLY DAILY AS NEEDED (RASH). 454 g 3  ? diazepam (VALIUM) 5 MG tablet Take one tablet by mouth with food one hour prior to procedure. May repeat 30 minutes prior if needed. (Patient not taking: Reported on 02/28/2022) 2 tablet 0  ? ?No current facility-administered medications for this visit.  ? ? ?Allergies as of  02/28/2022  ? (No Known Allergies)  ? ? ?Family History  ?Problem Relation Age of Onset  ? Heart attack Mother   ? Coronary artery disease Father   ? Ulcerative colitis Father   ? Ulcers Father   ? Breast cancer Sister   ? COPD Brother   ? Pancreatic cancer Brother   ? Healthy Sister   ? Diabetes Brother   ? Cirrhosis Son   ? Seizures Son   ? ? ?Social History  ? ?Socioeconomic History  ? Marital status: Married  ?  Spouse name: Not on file  ? Number of children: 2  ? Years of education: Not on file  ? Highest education level: Not on file  ?Occupational History  ? Occupation: Retired Clinical biochemist  ?Tobacco Use  ? Smoking status: Former  ?  Packs/day: 1.00  ?  Years: 30.00  ?  Pack years: 30.00  ?  Types: Cigarettes  ?  Quit date: 05/21/1989  ?  Years since quitting: 32.7  ?  Passive exposure: Never  ? Smokeless tobacco: Never  ?Vaping Use  ? Vaping Use: Never used  ?Substance and Sexual Activity  ? Alcohol use: Yes  ?  Comment: 1 beer and 1 mix drink per day  ? Drug use: No  ? Sexual activity: Not on file  ?Other Topics Concern  ? Not on file  ?Social History Narrative  ? 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  ? ?Social Determinants of Health   ? ?Financial Resource Strain: Low Risk   ? Difficulty of Paying Living Expenses: Not hard at all  ?Food Insecurity: No Food Insecurity  ? Worried About Charity fundraiser in the Last Year: Never true  ? Ran Out of Fo

## 2022-02-28 NOTE — Patient Instructions (Signed)
We will get you scheduled for EGD and colonoscopy for further evaluation of your iron deficiency ?I would like to recheck liver function today as it was slightly elevated last month ?You can continue taking your iron pill every other day ?Please let me know if you develop any new or worsening GI symptoms ? ?

## 2022-02-28 NOTE — Telephone Encounter (Signed)
Thomasene Dubow Ann Azra Abrell, CMA  ?

## 2022-03-01 LAB — HEPATIC FUNCTION PANEL
AG Ratio: 1.6 (calc) (ref 1.0–2.5)
ALT: 41 U/L (ref 9–46)
AST: 31 U/L (ref 10–35)
Albumin: 3.9 g/dL (ref 3.6–5.1)
Alkaline phosphatase (APISO): 86 U/L (ref 35–144)
Bilirubin, Direct: 0.1 mg/dL (ref 0.0–0.2)
Globulin: 2.5 g/dL (calc) (ref 1.9–3.7)
Indirect Bilirubin: 0.3 mg/dL (calc) (ref 0.2–1.2)
Total Bilirubin: 0.4 mg/dL (ref 0.2–1.2)
Total Protein: 6.4 g/dL (ref 6.1–8.1)

## 2022-03-08 DIAGNOSIS — D508 Other iron deficiency anemias: Secondary | ICD-10-CM | POA: Diagnosis not present

## 2022-03-08 DIAGNOSIS — D631 Anemia in chronic kidney disease: Secondary | ICD-10-CM | POA: Diagnosis not present

## 2022-03-08 DIAGNOSIS — I5032 Chronic diastolic (congestive) heart failure: Secondary | ICD-10-CM | POA: Diagnosis not present

## 2022-03-08 DIAGNOSIS — I129 Hypertensive chronic kidney disease with stage 1 through stage 4 chronic kidney disease, or unspecified chronic kidney disease: Secondary | ICD-10-CM | POA: Diagnosis not present

## 2022-03-08 DIAGNOSIS — E211 Secondary hyperparathyroidism, not elsewhere classified: Secondary | ICD-10-CM | POA: Diagnosis not present

## 2022-03-08 DIAGNOSIS — N184 Chronic kidney disease, stage 4 (severe): Secondary | ICD-10-CM | POA: Diagnosis not present

## 2022-03-08 DIAGNOSIS — R809 Proteinuria, unspecified: Secondary | ICD-10-CM | POA: Diagnosis not present

## 2022-03-08 DIAGNOSIS — N189 Chronic kidney disease, unspecified: Secondary | ICD-10-CM | POA: Diagnosis not present

## 2022-03-14 DIAGNOSIS — E211 Secondary hyperparathyroidism, not elsewhere classified: Secondary | ICD-10-CM | POA: Diagnosis not present

## 2022-03-14 DIAGNOSIS — N184 Chronic kidney disease, stage 4 (severe): Secondary | ICD-10-CM | POA: Diagnosis not present

## 2022-03-14 DIAGNOSIS — I5032 Chronic diastolic (congestive) heart failure: Secondary | ICD-10-CM | POA: Diagnosis not present

## 2022-03-14 DIAGNOSIS — R809 Proteinuria, unspecified: Secondary | ICD-10-CM | POA: Diagnosis not present

## 2022-03-14 DIAGNOSIS — D631 Anemia in chronic kidney disease: Secondary | ICD-10-CM | POA: Diagnosis not present

## 2022-03-14 DIAGNOSIS — I129 Hypertensive chronic kidney disease with stage 1 through stage 4 chronic kidney disease, or unspecified chronic kidney disease: Secondary | ICD-10-CM | POA: Diagnosis not present

## 2022-03-14 DIAGNOSIS — N189 Chronic kidney disease, unspecified: Secondary | ICD-10-CM | POA: Diagnosis not present

## 2022-03-26 NOTE — Progress Notes (Deleted)
Office Visit Note  Patient: Jacob Farmer             Date of Birth: 1946/10/02           MRN: [id number]             PCP: Lindell Spar, MD Referring: Lindell Spar, MD Visit Date: 04/09/2022 Occupation: '@GUAROCC'$ @  Subjective:  No chief complaint on file.   History of Present Illness: Jacob Farmer is a 76 y.o. male ***   Activities of Daily Living:  Patient reports morning stiffness for *** {minute/hour:19697}.   Patient {ACTIONS;DENIES/REPORTS:21021675::"Denies"} nocturnal pain.  Difficulty dressing/grooming: {ACTIONS;DENIES/REPORTS:21021675::"Denies"} Difficulty climbing stairs: {ACTIONS;DENIES/REPORTS:21021675::"Denies"} Difficulty getting out of chair: {ACTIONS;DENIES/REPORTS:21021675::"Denies"} Difficulty using hands for taps, buttons, cutlery, and/or writing: {ACTIONS;DENIES/REPORTS:21021675::"Denies"}  No Rheumatology ROS completed.   PMFS History:  Patient Active Problem List   Diagnosis Date Noted   Iron deficiency anemia 02/28/2022   Elevated liver transaminase level 02/28/2022   Encounter for general adult medical examination with abnormal findings 01/25/2022   Paroxysmal atrial fibrillation ([place]) 01/22/2022   Allergic dermatitis 09/12/2021   History of gastric ulcer    Herniated cervical disc 05/26/2017    Class: Acute   Cervical spinal stenosis 05/26/2017   Primary osteoarthritis of both hands 02/04/2017   History of gout 02/04/2017   DDD (degenerative disc disease), lumbar 02/04/2017   High risk medication use 01/28/2017   History of prostate cancer 01/28/2017   Chronic kidney disease, stage 4 (severe) ([place]) 12/27/2011   Type 2 diabetes mellitus with hyperosmolarity without coma, without long-term current use of insulin ([place]) 12/27/2011   Rheumatoid arthritis ([place]) 01/04/2011   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 10/02/2010   SINUS BRADYCARDIA 01/12/2010   Coronary artery disease involving native coronary  artery of native heart without angina pectoris 01/12/2010   Hyperlipidemia 01/08/2010   Essential hypertension 01/08/2010    Past Medical History:  Diagnosis Date   CAD (coronary artery disease)    Acute myocardial infarction treated with TPA in 1990; 1999-stents to circumflex and RCA; residual total occlusion of the left anterior descending; normal ejection fraction; stress nuclear in 2001-distal anteroseptal ischemia; normal LV function   CKD (chronic kidney disease) stage 4, GFR 15-29 ml/min (HCC)    First degree heart block    History of gastric ulcer    History of kidney stones    History of MI (myocardial infarction)    1990-  treated w/ TPA   Hyperlipidemia    Hypertension    Jaundice    OA (osteoarthritis)    Prediabetes    Prostate cancer (HCC)    Stage T1c , Gleason 3+4,  PSA 4.7,  vol 24cc--  scheduled for radiative seed implants   Psoriatic arthritis ([place])    RA (rheumatoid arthritis) ([place])    Dr. Toni Amend   Sinus bradycardia    Wears dentures     Family History  Problem Relation Age of Onset   Heart attack Mother    Coronary artery disease Father    Ulcerative colitis Father    Ulcers Father    Breast cancer Sister    COPD Brother    Pancreatic cancer Brother    Healthy Sister    Diabetes Brother    Cirrhosis Son    Seizures Son    Past Surgical History:  Procedure Laterality Date   BIOPSY  08/27/2019   Procedure: BIOPSY;  Surgeon: Rogene Houston, MD;  [facility];  Service:  Endoscopy;;  gastric   CARDIOVASCULAR STRESS TEST  2001  per Dr Lattie Haw clinic note   distal anteroseptal ischemia,  normal LVF   COLONOSCOPY  last one 2006 (approx)   [place]   DES x2  to CFX and RCA/  residual total occlusion LAD,  normal LVEF   ESOPHAGOGASTRODUODENOSCOPY (EGD) WITH PROPOFOL N/A 08/27/2019   Procedure: ESOPHAGOGASTRODUODENOSCOPY (EGD) WITH PROPOFOL;  Surgeon: Rogene Houston, MD;  [facility];   Service: Endoscopy;  Laterality: N/A;  10:10   EYE SURGERY Bilateral    cataract   POSTERIOR CERVICAL FUSION/FORAMINOTOMY N/A 05/26/2017   Procedure: RIGHT C4-5 FORAMINOTOMY WITH EXCISION OF HERNIATED [place];  Surgeon: Jessy Oto, MD;  Location: [place];  Service: Orthopedics;  Laterality: N/A;   RADIOACTIVE SEED IMPLANT N/A 01/11/2016   Procedure: RADIOACTIVE SEED IMPLANT/BRACHYTHERAPY IMPLANT;  Surgeon: Franchot Gallo, MD;  Location: [facility];  Service: Urology;  Laterality: N/A;   73  seeds implanted no seeds founds in bladder   Social History   Social History Narrative   Married for 24 years.Retired Clinical biochemist.   Immunization History  Administered Date(s) Administered   Influenza, High Dose Seasonal PF 09/12/2017, 08/28/2019, 08/28/2019   Influenza-Unspecified 08/08/2020, 08/24/2021   PFIZER(Purple Top)SARS-COV-2 Vaccination 01/22/2020, 02/12/2020, 08/15/2020, 01/19/2021   Pneumococcal Polysaccharide-23 08/16/2009   Zoster Recombinat (Shingrix) 07/29/2019     Objective: Vital Signs: There were no vitals taken for this visit.   Physical Exam   Musculoskeletal Exam: ***  CDAI Exam: CDAI Score: -- Patient Global: --; Provider Global: -- Swollen: --; Tender: -- Joint Exam 04/09/2022   No joint exam has been documented for this visit   There is currently no information documented on the homunculus. Go to the Rheumatology activity and complete the homunculus joint exam.  Investigation: No additional findings.  Imaging: No results found.  Recent Labs: Lab Results  Component Value Date   WBC 4.9 02/12/2022   HGB 12.6 (L) 02/12/2022   PLT 149 02/12/2022   NA 140 02/12/2022   K 4.5 02/12/2022   CL 108 02/12/2022   CO2 26 02/12/2022   GLUCOSE 78 02/12/2022   BUN 29 (H) 02/12/2022   CREATININE 2.36 (H) 02/12/2022   BILITOT 0.4 02/28/2022   ALKPHOS 254 (H) 11/06/2020   AST 31 02/28/2022   ALT 41 02/28/2022   PROT 6.4 02/28/2022   ALBUMIN 2.9  (L) 11/06/2020   CALCIUM 9.1 02/12/2022   GFRAA 27 (L) 06/14/2021    Speciality Comments: No specialty comments available.  Procedures:  No procedures performed Allergies: Patient has no known allergies.   Assessment / Plan:     Visit Diagnoses: No diagnosis found.  Orders: No orders of the defined types were placed in this encounter.  No orders of the defined types were placed in this encounter.   Face-to-face time spent with patient was *** minutes. Greater than 50% of time was spent in counseling and coordination of care.  Follow-Up Instructions: No follow-ups on file.   Earnestine Mealing, CMA  Note - This record has been created using Editor, commissioning.  Chart creation errors have been sought, but may not always  have been located. Such creation errors do not reflect on  the standard of medical care.

## 2022-04-03 ENCOUNTER — Ambulatory Visit (HOSPITAL_BASED_OUTPATIENT_CLINIC_OR_DEPARTMENT_OTHER): Payer: Medicare HMO | Admitting: Certified Registered"

## 2022-04-03 ENCOUNTER — Other Ambulatory Visit: Payer: Self-pay

## 2022-04-03 ENCOUNTER — Encounter (HOSPITAL_COMMUNITY)
Admission: RE | Disposition: A | Discharge status: Home / Self Care | Payer: Self-pay | Source: Ambulatory Visit | Attending: Gastroenterology

## 2022-04-03 ENCOUNTER — Encounter (INDEPENDENT_AMBULATORY_CARE_PROVIDER_SITE_OTHER): Payer: Self-pay | Admitting: *Deleted

## 2022-04-03 ENCOUNTER — Ambulatory Visit (HOSPITAL_COMMUNITY)
Admission: RE | Admit: 2022-04-03 | Discharge: 2022-04-03 | Disposition: A | Discharge status: Home / Self Care | Payer: Medicare HMO | Source: Ambulatory Visit | Attending: Gastroenterology | Admitting: Gastroenterology

## 2022-04-03 ENCOUNTER — Ambulatory Visit (HOSPITAL_COMMUNITY): Payer: Medicare HMO | Admitting: Certified Registered"

## 2022-04-03 ENCOUNTER — Encounter (HOSPITAL_COMMUNITY): Payer: Self-pay | Admitting: Gastroenterology

## 2022-04-03 DIAGNOSIS — Z87442 Personal history of urinary calculi: Secondary | ICD-10-CM | POA: Diagnosis not present

## 2022-04-03 DIAGNOSIS — D509 Iron deficiency anemia, unspecified: Secondary | ICD-10-CM

## 2022-04-03 DIAGNOSIS — K635 Polyp of colon: Secondary | ICD-10-CM

## 2022-04-03 DIAGNOSIS — K5520 Angiodysplasia of colon without hemorrhage: Secondary | ICD-10-CM

## 2022-04-03 DIAGNOSIS — N184 Chronic kidney disease, stage 4 (severe): Secondary | ICD-10-CM

## 2022-04-03 DIAGNOSIS — D120 Benign neoplasm of cecum: Secondary | ICD-10-CM | POA: Insufficient documentation

## 2022-04-03 DIAGNOSIS — K319 Disease of stomach and duodenum, unspecified: Secondary | ICD-10-CM | POA: Diagnosis not present

## 2022-04-03 DIAGNOSIS — K2970 Gastritis, unspecified, without bleeding: Secondary | ICD-10-CM

## 2022-04-03 DIAGNOSIS — E1122 Type 2 diabetes mellitus with diabetic chronic kidney disease: Secondary | ICD-10-CM | POA: Diagnosis not present

## 2022-04-03 DIAGNOSIS — Z8546 Personal history of malignant neoplasm of prostate: Secondary | ICD-10-CM | POA: Insufficient documentation

## 2022-04-03 DIAGNOSIS — D122 Benign neoplasm of ascending colon: Secondary | ICD-10-CM | POA: Diagnosis not present

## 2022-04-03 DIAGNOSIS — M069 Rheumatoid arthritis, unspecified: Secondary | ICD-10-CM | POA: Diagnosis not present

## 2022-04-03 DIAGNOSIS — E785 Hyperlipidemia, unspecified: Secondary | ICD-10-CM | POA: Insufficient documentation

## 2022-04-03 DIAGNOSIS — I252 Old myocardial infarction: Secondary | ICD-10-CM | POA: Diagnosis not present

## 2022-04-03 DIAGNOSIS — I129 Hypertensive chronic kidney disease with stage 1 through stage 4 chronic kidney disease, or unspecified chronic kidney disease: Secondary | ICD-10-CM | POA: Diagnosis not present

## 2022-04-03 DIAGNOSIS — Z955 Presence of coronary angioplasty implant and graft: Secondary | ICD-10-CM | POA: Diagnosis not present

## 2022-04-03 DIAGNOSIS — I2510 Atherosclerotic heart disease of native coronary artery without angina pectoris: Secondary | ICD-10-CM | POA: Insufficient documentation

## 2022-04-03 DIAGNOSIS — Z923 Personal history of irradiation: Secondary | ICD-10-CM | POA: Diagnosis not present

## 2022-04-03 DIAGNOSIS — Z87891 Personal history of nicotine dependence: Secondary | ICD-10-CM | POA: Diagnosis not present

## 2022-04-03 HISTORY — PX: COLONOSCOPY WITH PROPOFOL: SHX5780

## 2022-04-03 HISTORY — PX: HOT HEMOSTASIS: SHX5433

## 2022-04-03 HISTORY — PX: POLYPECTOMY: SHX5525

## 2022-04-03 HISTORY — PX: BIOPSY: SHX5522

## 2022-04-03 HISTORY — PX: ESOPHAGOGASTRODUODENOSCOPY (EGD) WITH PROPOFOL: SHX5813

## 2022-04-03 LAB — GLUCOSE, CAPILLARY: Glucose-Capillary: 100 mg/dL — ABNORMAL HIGH (ref 70–99)

## 2022-04-03 LAB — HM COLONOSCOPY

## 2022-04-03 SURGERY — COLONOSCOPY WITH PROPOFOL
Anesthesia: General

## 2022-04-03 MED ORDER — LACTATED RINGERS IV SOLN: INTRAVENOUS | Status: DC | PRN

## 2022-04-03 MED ORDER — PHENYLEPHRINE 80 MCG/ML (10ML) SYRINGE FOR IV PUSH (FOR BLOOD PRESSURE SUPPORT)
PREFILLED_SYRINGE | INTRAVENOUS | Status: AC
  Filled 2022-04-03: qty 10

## 2022-04-03 MED ORDER — STERILE WATER FOR IRRIGATION IR SOLN
Status: DC | PRN
  Administered 2022-04-03: 120 mL

## 2022-04-03 MED ORDER — LACTATED RINGERS IV SOLN: INTRAVENOUS | Status: DC

## 2022-04-03 MED ORDER — OMEPRAZOLE 40 MG PO CPDR: 40.0000 mg | DELAYED_RELEASE_CAPSULE | Freq: Every day | ORAL | 3 refills | Status: DC

## 2022-04-03 MED ORDER — LIDOCAINE HCL (CARDIAC) PF 100 MG/5ML IV SOSY
PREFILLED_SYRINGE | INTRAVENOUS | Status: DC | PRN
  Administered 2022-04-03: 50 mg via INTRAVENOUS

## 2022-04-03 MED ORDER — PROPOFOL 500 MG/50ML IV EMUL
INTRAVENOUS | Status: DC | PRN
  Administered 2022-04-03: 150 ug/kg/min via INTRAVENOUS

## 2022-04-03 MED ORDER — PHENYLEPHRINE 80 MCG/ML (10ML) SYRINGE FOR IV PUSH (FOR BLOOD PRESSURE SUPPORT)
PREFILLED_SYRINGE | INTRAVENOUS | Status: DC | PRN
  Administered 2022-04-03 (×3): 80 ug via INTRAVENOUS

## 2022-04-03 MED ORDER — PROPOFOL 10 MG/ML IV BOLUS
INTRAVENOUS | Status: DC | PRN
  Administered 2022-04-03: 100 mg via INTRAVENOUS
  Administered 2022-04-03: 40 mg via INTRAVENOUS

## 2022-04-03 NOTE — H&P (Signed)
Jacob Farmer is an 76 y.o. male.   ?Chief Complaint: Iron deficiency anemia ?HPI:  76 y.o. male with past medical history of MI and CAD s/p stents x2, prostate cancer s/p radiation therapy, RA, history of kidney stones, hyperlipidemia, hypertension, prediabetes, CKD stage 4, coming for evaluation of iron deficiency anemia. ? ?Has not seen any rectal bleeding or melena, although since he started taking oral iron his stools are darker.  No nausea, vomiting, fever, chills, abdominal distention, abdominal pain, weight loss. ? ?Last Colonoscopy:2006? ?  ?Last Endoscopy:08/27/19- Normal esophagus. ?- Z-line irregular, 40 cm from the incisors. ?- Gastritis. ?- Non-bleeding gastric ulcer. Biopsied. ?- Normal duodenal bulb and second portion of the duodenum. ? ?Past Medical History:  ?Diagnosis Date  ? CAD (coronary artery disease)   ? Acute myocardial infarction treated with TPA in 1990; 1999-stents to circumflex and RCA; residual total occlusion of the left anterior descending; normal ejection fraction; stress nuclear in 2001-distal anteroseptal ischemia; normal LV function  ? CKD (chronic kidney disease) stage 4, GFR 15-29 ml/min (HCC)   ? First degree heart block   ? History of gastric ulcer   ? History of kidney stones   ? History of MI (myocardial infarction)   ? 1990-  treated w/ TPA  ? Hyperlipidemia   ? Hypertension   ? Jaundice   ? OA (osteoarthritis)   ? Prediabetes   ? Prostate cancer ([place])   ? Stage T1c , Gleason 3+4,  PSA 4.7,  vol 24cc--  scheduled for radiative seed implants  ? Psoriatic arthritis ([place])   ? RA (rheumatoid arthritis) ([place])   ? Dr. Toni Amend  ? Sinus bradycardia   ? Wears dentures   ? ? ?Past Surgical History:  ?Procedure Laterality Date  ? BIOPSY  08/27/2019  ? Procedure: BIOPSY;  Surgeon: Rogene Houston, MD;  [facility];  Service: Endoscopy;;  gastric  ? CARDIOVASCULAR STRESS TEST  2001  per Dr Lattie Haw clinic note  ? distal anteroseptal ischemia,  normal LVF  ? COLONOSCOPY   last one 2006 (approx)  ? CORONARY ANGIOPLASTY WITH STENT PLACEMENT  1999  ? DES x2  to CFX and RCA/  residual total occlusion LAD,  normal LVEF  ? ESOPHAGOGASTRODUODENOSCOPY (EGD) WITH PROPOFOL N/A 08/27/2019  ? Procedure: ESOPHAGOGASTRODUODENOSCOPY (EGD) WITH PROPOFOL;  Surgeon: Rogene Houston, MD;  [facility];  Service: Endoscopy;  Laterality: N/A;  10:10  ? EYE SURGERY Bilateral   ? cataract  ? POSTERIOR CERVICAL FUSION/FORAMINOTOMY N/A 05/26/2017  ? Procedure: RIGHT C4-5 FORAMINOTOMY WITH EXCISION OF HERNIATED [place];  Surgeon: Jessy Oto, MD;  Location: [place];  Service: Orthopedics;  Laterality: N/A;  ? RADIOACTIVE SEED IMPLANT N/A 01/11/2016  ? Procedure: RADIOACTIVE SEED IMPLANT/BRACHYTHERAPY IMPLANT;  Surgeon: Franchot Gallo, MD;  Location: [facility];  Service: Urology;  Laterality: N/A;   73  seeds implanted ?no seeds founds in bladder  ? ? ?Family History  ?Problem Relation Age of Onset  ? Heart attack Mother   ? Coronary artery disease Father   ? Ulcerative colitis Father   ? Ulcers Father   ? Breast cancer Sister   ? COPD Brother   ? Pancreatic cancer Brother   ? Healthy Sister   ? Diabetes Brother   ? Cirrhosis Son   ? Seizures Son   ? ?Social History:  reports that he quit smoking about 32 years ago. His smoking use included cigarettes. He has a 30.00 pack-year smoking history. He  has never been exposed to tobacco smoke. He has never used smokeless tobacco. He reports current alcohol use. He reports that he does not use drugs. ? ?Allergies: No Known Allergies ? ?Medications Prior to Admission  ?Medication Sig Dispense Refill  ? allopurinol (ZYLOPRIM) 100 MG tablet TAKE 1/2 TABLET EVERY OTHER DAY 45 tablet 0  ? amLODipine (NORVASC) 5 MG tablet Take 1 tablet (5 mg total) by mouth daily. 90 tablet 3  ? apixaban (ELIQUIS) 5 MG TABS tablet Take 1 tablet (5 mg total) by mouth 2 (two) times daily. 180 tablet 3  ? Dupilumab (DUPIXENT) 300 MG/2ML SOPN Inject 1 application into  the skin every 14 (fourteen) days. 300 mL 4  ? ferrous sulfate 325 (65 FE) MG EC tablet Take 1 tablet by mouth daily.    ? folic acid (FOLVITE) 846 MCG tablet Take 400 mcg by mouth daily.    ? hydrALAZINE (APRESOLINE) 25 MG tablet Take 50 mg by mouth 3 (three) times daily.    ? losartan (COZAAR) 25 MG tablet Take 25 mg by mouth daily.    ? rosuvastatin (CRESTOR) 40 MG tablet TAKE 1 TABLET EVERY DAY . STOP ATORVASTATIN 90 tablet 3  ? triamcinolone cream (KENALOG) 0.1 % APPLY 1 APPLICATION TOPICALLY DAILY AS NEEDED (RASH). (Patient taking differently: Apply 1 application. topically daily.) 454 g 3  ? Alcohol Swabs (DROPSAFE ALCOHOL PREP) 70 % PADS USE AS NEEDED TO CLEAN SKIN PRIOR TO FINGERPRICK AND INJECTION 100 each 5  ? Blood Glucose Monitoring Suppl (ACCU-CHEK GUIDE) w/Device KIT 1 Units by Does not apply route 2 (two) times daily. 1 kit 3  ? Colchicine 0.6 MG CAPS Take 0.6 mg by mouth daily as needed (Gout).    ? glucose blood (ACCU-CHEK GUIDE) test strip Use as instructed 100 each 12  ? Lancets (ACCU-CHEK SOFT TOUCH) lancets Use as instructed 100 each 12  ? leflunomide (ARAVA) 10 MG tablet Take 1 tablet (10 mg total) by mouth daily. 90 tablet 0  ? nitroGLYCERIN (NITROSTAT) 0.4 MG SL tablet Place 0.4 mg under the tongue every 5 (five) minutes as needed for chest pain.     ? ? ?Results for orders placed or performed during the hospital encounter of 04/03/22 (from the past 48 hour(s))  ?Glucose, capillary     Status: Abnormal  ? Collection Time: 04/03/22  8:03 AM  ?Result Value Ref Range  ? Glucose-Capillary 100 (H) 70 - 99 mg/dL  ?  Comment: Glucose reference range applies only to samples taken after fasting for at least 8 hours.  ? ?No results found. ? ?Review of Systems  ?Constitutional: Negative.   ?HENT: Negative.    ?Eyes: Negative.   ?Respiratory: Negative.    ?Cardiovascular: Negative.   ?Gastrointestinal: Negative.   ?Endocrine: Negative.   ?Genitourinary: Negative.   ?Musculoskeletal: Negative.   ?Skin:  Negative.   ?Allergic/Immunologic: Negative.   ?Neurological: Negative.   ?Hematological: Negative.   ?Psychiatric/Behavioral: Negative.    ? ?Blood pressure (!) 164/80, pulse 89, temperature 98.6 ?F (37 ?C), temperature source Oral, resp. rate 15, height $RemoveBe'5\' 10"'fSMIXqvPX$  (1.778 m), weight 85.7 kg, SpO2 99 %. ?Physical Exam  ?GENERAL: The patient is AO x3, in no acute distress. ?HEENT: Head is normocephalic and atraumatic. EOMI are intact. Mouth is well hydrated and without lesions. ?NECK: Supple. No masses ?LUNGS: Clear to auscultation. No presence of rhonchi/wheezing/rales. Adequate chest expansion ?HEART: RRR, normal s1 and s2. ?ABDOMEN: Soft, nontender, no guarding, no peritoneal signs, and nondistended. BS +. No  masses. ?EXTREMITIES: Without any cyanosis, clubbing, rash, lesions or edema. ?NEUROLOGIC: AOx3, no focal motor deficit. ?SKIN: no jaundice, no rashes ? ?Assessment/Plan ?76 y.o. male with past medical history of MI and CAD s/p stents x2, prostate cancer s/p radiation therapy, RA, history of kidney stones, hyperlipidemia, hypertension, prediabetes, CKD stage 4, coming for evaluation of iron deficiency anemia.  We will proceed with EGD and colonoscopy. ? ?Harvel Quale, MD ?04/03/2022, 8:25 AM ? ? ? ?

## 2022-04-03 NOTE — Anesthesia Preprocedure Evaluation (Signed)
Anesthesia Evaluation  ?Patient identified by MRN, date of birth, ID band ?Patient awake ? ? ? ?Reviewed: ?Allergy & Precautions, H&P , NPO status , Patient's Chart, lab work & pertinent test results, reviewed documented beta blocker date and time  ? ?Airway ?Mallampati: II ? ?TM Distance: >3 FB ?Neck ROM: full ? ? ? Dental ?no notable dental hx. ? ?  ?Pulmonary ?neg pulmonary ROS, former smoker,  ?  ?Pulmonary exam normal ?breath sounds clear to auscultation ? ? ? ? ? ? Cardiovascular ?Exercise Tolerance: Good ?hypertension, + CAD and + Cardiac Stents  ? ?Rhythm:regular Rate:Normal ? ? ?  ?Neuro/Psych ?negative neurological ROS ? negative psych ROS  ? GI/Hepatic ?negative GI ROS, Neg liver ROS,   ?Endo/Other  ?diabetes, Type 2 ? Renal/GU ?CRFRenal disease  ?negative genitourinary ?  ?Musculoskeletal ? ? Abdominal ?  ?Peds ? Hematology ? ?(+) Blood dyscrasia, anemia ,   ?Anesthesia Other Findings ? ? Reproductive/Obstetrics ?negative OB ROS ? ?  ? ? ? ? ? ? ? ? ? ? ? ? ? ?  ?  ? ? ? ? ? ? ? ? ?Anesthesia Physical ?Anesthesia Plan ? ?ASA: 3 ? ?Anesthesia Plan: General  ? ?Post-op Pain Management:   ? ?Induction:  ? ?PONV Risk Score and Plan: Propofol infusion ? ?Airway Management Planned:  ? ?Additional Equipment:  ? ?Intra-op Plan:  ? ?Post-operative Plan:  ? ?Informed Consent: I have reviewed the patients History and Physical, chart, labs and discussed the procedure including the risks, benefits and alternatives for the proposed anesthesia with the patient or authorized representative who has indicated his/her understanding and acceptance.  ? ? ? ?Dental Advisory Given ? ?Plan Discussed with: CRNA ? ?Anesthesia Plan Comments:   ? ? ? ? ? ? ?Anesthesia Quick Evaluation ? ?

## 2022-04-03 NOTE — Op Note (Signed)
[facility] ?Patient Name: Jacob Farmer ?Procedure Date: 04/03/2022 8:48 AM ?MRN: [id number] ?Date of Birth: 1946-09-14 ?Attending MD: Maylon Peppers ,  ?CSN: [id number] ?Age: 76 ?Admit Type: Outpatient ?Procedure:                Colonoscopy ?Indications:              Iron deficiency anemia ?Providers:                Maylon Peppers, Lambert Mody, Kristine L.  ?                          Risa Grill, Technician ?Referring MD:              ?Medicines:                Monitored Anesthesia Care ?Complications:            No immediate complications. ?Estimated Blood Loss:     Estimated blood loss: none. ?Procedure:                Pre-Anesthesia Assessment: ?                          - Prior to the procedure, a History and Physical  ?                          was performed, and patient medications, allergies  ?                          and sensitivities were reviewed. The patient's  ?                          tolerance of previous anesthesia was reviewed. ?                          - The risks and benefits of the procedure and the  ?                          sedation options and risks were discussed with the  ?                          patient. All questions were answered and informed  ?                          consent was obtained. ?                          - ASA Grade Assessment: II - A patient with mild  ?                          systemic disease. ?                          After obtaining informed consent, the colonoscope  ?                          was passed under direct vision. Throughout the  ?  procedure, the patient's blood pressure, pulse, and  ?                          oxygen saturations were monitored continuously. The  ?                          [id number]-HQ190L ([id number]) scope was introduced through  ?                          the anus and advanced to the the cecum, identified  ?                          by appendiceal orifice and ileocecal valve. The  ?                           colonoscopy was performed without difficulty. The  ?                          patient tolerated the procedure well. The quality  ?                          of the bowel preparation was excellent. ?Scope In: 8:51:46 AM ?Scope Out: 9:19:13 AM ?Scope Withdrawal Time: 0 hours 23 minutes 21 seconds  ?Total Procedure Duration: 0 hours 27 minutes 27 seconds  ?Findings: ?     The perianal and digital rectal examinations were normal. ?     Two medium-sized localized angiodysplastic lesions with typical  ?     arborization were found in the cecum. Coagulation for bleeding  ?     prevention using argon plasma at 0.3 liters/minute and 20 watts was  ?     successful. ?     Three sessile polyps were found in the ascending colon and cecum. The  ?     polyps were 3 to 6 mm in size. These polyps were removed with a cold  ?     snare. Resection and retrieval were complete. ?     The retroflexed view of the distal rectum and anal verge was normal and  ?     showed no anal or rectal abnormalities. ?Impression:               - Two colonic angiodysplastic lesions. Treated with  ?                          argon plasma coagulation (APC). ?                          - Three 3 to 6 mm polyps in the ascending colon and  ?                          in the cecum, removed with a cold snare. Resected  ?                          and retrieved. ?                          -  The distal rectum and anal verge are normal on  ?                          retroflexion view. ?Moderate Sedation: ?     Per Anesthesia Care ?Recommendation:           - Discharge patient to home (ambulatory). ?                          - Resume previous diet. ?                          - Await pathology results. ?                          - Repeat colonoscopy is not recommended due to  ?                          current age (61 years or older) for screening  ?                          purposes. ?Procedure Code(s):        --- Professional --- ?                          [phone], 64,  Colonoscopy, flexible; with control of  ?                          bleeding, any method ?                          45385, Colonoscopy, flexible; with removal of  ?                          tumor(s), polyp(s), or other lesion(s) by snare  ?                          technique ?Diagnosis Code(s):        --- Professional --- ?                          K55.20, Angiodysplasia of colon without hemorrhage ?                          K63.5, Polyp of colon ?                          D50.9, Iron deficiency anemia, unspecified ?CPT copyright 2019 American Medical Association. All rights reserved. ?The codes documented in this report are preliminary and upon coder review may  ?be revised to meet current compliance requirements. ?Maylon Peppers, MD ?Maylon Peppers,  ?04/03/2022 9:26:09 AM ?This report has been signed electronically. ?Number of Addenda: 0 ?

## 2022-04-03 NOTE — Anesthesia Postprocedure Evaluation (Signed)
Anesthesia Post Note ? ?Patient: Jacob Farmer ? ?Procedure(s) Performed: COLONOSCOPY WITH PROPOFOL ?ESOPHAGOGASTRODUODENOSCOPY (EGD) WITH PROPOFOL ?BIOPSY ?POLYPECTOMY ?HOT HEMOSTASIS (ARGON PLASMA COAGULATION/BICAP) ? ?Patient location during evaluation: Phase II ?Anesthesia Type: General ?Level of consciousness: awake ?Pain management: pain level controlled ?Vital Signs Assessment: post-procedure vital signs reviewed and stable ?Respiratory status: spontaneous breathing and respiratory function stable ?Cardiovascular status: blood pressure returned to baseline and stable ?Postop Assessment: no headache and no apparent nausea or vomiting ?Anesthetic complications: no ?Comments: Late entry ? ? ?No notable events documented. ? ? ?Last Vitals:  ?Vitals:  ? 04/03/22 0748 04/03/22 0924  ?BP: (!) 164/80 (!) 106/52  ?Pulse: 89 65  ?Resp: 15 18  ?Temp: 37 ?C (!) 36.2 ?C  ?SpO2: 99% 97%  ?  ?Last Pain:  ?Vitals:  ? 04/03/22 0924  ?TempSrc: Axillary  ?PainSc: 0-No pain  ? ? ?  ?  ?  ?  ?  ?  ? ?Louann Sjogren ? ? ? ? ?

## 2022-04-03 NOTE — Discharge Instructions (Addendum)
You are being discharged to home.  ?Resume your previous diet.  ?We are waiting for your pathology results.  ?Continue oral iron daily ?Start omeprazole 40 mg qday. ?Restart Eliquis tonight. ?Your physician has indicated that a repeat colonoscopy is not recommended due to your current age (68 years or older) for screening purposes.  ?

## 2022-04-03 NOTE — Transfer of Care (Signed)
Immediate Anesthesia Transfer of Care Note ? ?Patient: Jacob Farmer ? ?Procedure(s) Performed: COLONOSCOPY WITH PROPOFOL ?ESOPHAGOGASTRODUODENOSCOPY (EGD) WITH PROPOFOL ?BIOPSY ?POLYPECTOMY ?HOT HEMOSTASIS (ARGON PLASMA COAGULATION/BICAP) ? ?Patient Location: Endoscopy Unit ? ?Anesthesia Type:General ? ?Level of Consciousness: awake ? ?Airway & Oxygen Therapy: Patient Spontanous Breathing ? ?Post-op Assessment: Report given to RN and Post -op Vital signs reviewed and stable ? ?Post vital signs: Reviewed and stable ? ?Last Vitals:  ?Vitals Value Taken Time  ?BP    ?Temp    ?Pulse    ?Resp    ?SpO2    ? ? ?Last Pain:  ?Vitals:  ? 04/03/22 0838  ?TempSrc:   ?PainSc: 0-No pain  ?   ? ?Patients Stated Pain Goal: 7 (04/03/22 0748) ? ?Complications: No notable events documented. ?

## 2022-04-03 NOTE — Anesthesia Procedure Notes (Signed)
Date/Time: 04/03/2022 8:40 AM ?Performed by: Orlie Dakin, CRNA ?Pre-anesthesia Checklist: Patient identified, Emergency Drugs available, Suction available and Patient being monitored ?Patient Re-evaluated:Patient Re-evaluated prior to induction ?Oxygen Delivery Method: Nasal cannula ?Induction Type: IV induction ?Placement Confirmation: positive ETCO2 ? ? ? ? ?

## 2022-04-03 NOTE — Op Note (Signed)
[facility] ?Patient Name: Jacob Farmer ?Procedure Date: 04/03/2022 8:31 AM ?MRN: [id number] ?Date of Birth: 05-Aug-1946 ?Attending MD: Maylon Peppers ,  ?CSN: [id number] ?Age: 76 ?Admit Type: Outpatient ?Procedure:                Upper GI endoscopy ?Indications:              Iron deficiency anemia ?Providers:                Maylon Peppers, Lambert Mody, Kristine L.  ?                          Risa Grill, Technician ?Referring MD:              ?Medicines:                Monitored Anesthesia Care ?Complications:            No immediate complications. ?Estimated Blood Loss:     Estimated blood loss: none. ?Procedure:                Pre-Anesthesia Assessment: ?                          - Prior to the procedure, a History and Physical  ?                          was performed, and patient medications, allergies  ?                          and sensitivities were reviewed. The patient's  ?                          tolerance of previous anesthesia was reviewed. ?                          - The risks and benefits of the procedure and the  ?                          sedation options and risks were discussed with the  ?                          patient. All questions were answered and informed  ?                          consent was obtained. ?                          - ASA Grade Assessment: II - A patient with mild  ?                          systemic disease. ?                          After obtaining informed consent, the endoscope was  ?                          passed under direct vision. Throughout the  ?  procedure, the patient's blood pressure, pulse, and  ?                          oxygen saturations were monitored continuously. The  ?                          GIF-H190 ([id number]) scope was introduced through the  ?                          mouth, and advanced to the second part of duodenum.  ?                          The upper GI endoscopy was accomplished without  ?                           difficulty. The patient tolerated the procedure  ?                          well. ?Scope In: 8:40:33 AM ?Scope Out: 8:46:30 AM ?Total Procedure Duration: 0 hours 5 minutes 57 seconds  ?Findings: ?     The examined esophagus was normal. ?     The Z-line was regular and was found 41 cm from the incisors. ?     Localized mild inflammation characterized by erosions and erythema was  ?     found in the gastric body. Biopsies were taken with a cold forceps for  ?     Helicobacter pylori testing. ?     The examined duodenum was normal. ?Impression:               - Normal esophagus. ?                          - Z-line regular, 41 cm from the incisors. ?                          - Gastritis. Biopsied. ?                          - Normal examined duodenum. ?Moderate Sedation: ?     Per Anesthesia Care ?Recommendation:           - Discharge patient to home (ambulatory). ?                          - Resume previous diet. ?                          - Await pathology results. ?                          - Continue oral iron daily ?                          - Start omeprazole 40 mg qday. ?Procedure Code(s):        --- Professional --- ?  16109, Esophagogastroduodenoscopy, flexible,  ?                          transoral; with biopsy, single or multiple ?Diagnosis Code(s):        --- Professional --- ?                          K29.70, Gastritis, unspecified, without bleeding ?                          D50.9, Iron deficiency anemia, unspecified ?CPT copyright 2019 American Medical Association. All rights reserved. ?The codes documented in this report are preliminary and upon coder review may  ?be revised to meet current compliance requirements. ?Maylon Peppers, MD ?Maylon Peppers,  ?04/03/2022 8:50:42 AM ?This report has been signed electronically. ?Number of Addenda: 0 ?

## 2022-04-04 LAB — SURGICAL PATHOLOGY

## 2022-04-05 ENCOUNTER — Encounter (HOSPITAL_COMMUNITY): Payer: Self-pay | Admitting: Gastroenterology

## 2022-04-08 ENCOUNTER — Ambulatory Visit (INDEPENDENT_AMBULATORY_CARE_PROVIDER_SITE_OTHER): Payer: Medicare HMO | Admitting: Family Medicine

## 2022-04-08 ENCOUNTER — Encounter: Payer: Self-pay | Admitting: Family Medicine

## 2022-04-08 VITALS — BP 136/82 | HR 78 | Ht 70.0 in | Wt 187.4 lb

## 2022-04-08 DIAGNOSIS — R42 Dizziness and giddiness: Secondary | ICD-10-CM

## 2022-04-08 MED ORDER — MECLIZINE HCL 25 MG PO TABS: 25.0000 mg | ORAL_TABLET | Freq: Three times a day (TID) | ORAL | 0 refills | Status: DC | PRN

## 2022-04-08 NOTE — Progress Notes (Signed)
? ?Established Patient Office Visit ? ?Subjective:  ?Patient ID: Jacob Farmer, male    DOB: August 21, 1946  Age: 76 y.o. MRN: [id number] ? ?CC:  ?Chief Complaint  ?Patient presents with  ? Dizziness  ?  Onset last couple of weeks, comes and goes, pt states equilibrium has been off. Has been feeling as if he is going faint but has not fainted.   ? ? ?HPI ?Jacob Farmer is a 76 y.o. male with past medical history of CAD status post stent placement, HTN, HLD, RA, CKD stage IV, prostate ca. s/p radiotherapy, gout, cervical spinal stenosis, and GERD presents with complaints of intermittent dizziness for two weeks. He reports having dizzy spells 3-4 times daily. The dizzy spell last about 2-3 mins. The dizzy spell has occurred while driving, completing his cross word puzzle in the morning, and walking into [employer].He denies ringing in the ears, spinning sensations, nausea, and vomiting.  ? ? ?Past Medical History:  ?Diagnosis Date  ? CAD (coronary artery disease)   ? Acute myocardial infarction treated with TPA in 1990; 1999-stents to circumflex and RCA; residual total occlusion of the left anterior descending; normal ejection fraction; stress nuclear in 2001-distal anteroseptal ischemia; normal LV function  ? CKD (chronic kidney disease) stage 4, GFR 15-29 ml/min (HCC)   ? First degree heart block   ? History of gastric ulcer   ? History of kidney stones   ? History of MI (myocardial infarction)   ? 1990-  treated w/ TPA  ? Hyperlipidemia   ? Hypertension   ? Jaundice   ? OA (osteoarthritis)   ? Prediabetes   ? Prostate cancer ([place])   ? Stage T1c , Gleason 3+4,  PSA 4.7,  vol 24cc--  scheduled for radiative seed implants  ? Psoriatic arthritis ([place])   ? RA (rheumatoid arthritis) ([place])   ? Dr. Toni Amend  ? Sinus bradycardia   ? Wears dentures   ? ? ?Past Surgical History:  ?Procedure Laterality Date  ? BIOPSY  08/27/2019  ? Procedure: BIOPSY;  Surgeon: Rogene Houston, MD;  [facility];  Service: Endoscopy;;   gastric  ? BIOPSY  04/03/2022  ? Procedure: BIOPSY;  Surgeon: Montez Morita, Quillian Quince, MD;  [facility];  Service: Gastroenterology;;  ? CARDIOVASCULAR STRESS TEST  2001  per Dr Lattie Haw clinic note  ? distal anteroseptal ischemia,  normal LVF  ? COLONOSCOPY  last one 2006 (approx)  ? COLONOSCOPY WITH PROPOFOL N/A 04/03/2022  ? Procedure: COLONOSCOPY WITH PROPOFOL;  Surgeon: Harvel Quale, MD;  [facility];  Service: Gastroenterology;  Laterality: N/A;  945  ? CORONARY ANGIOPLASTY WITH STENT PLACEMENT  1999  ? DES x2  to CFX and RCA/  residual total occlusion LAD,  normal LVEF  ? ESOPHAGOGASTRODUODENOSCOPY (EGD) WITH PROPOFOL N/A 08/27/2019  ? Procedure: ESOPHAGOGASTRODUODENOSCOPY (EGD) WITH PROPOFOL;  Surgeon: Rogene Houston, MD;  [facility];  Service: Endoscopy;  Laterality: N/A;  10:10  ? ESOPHAGOGASTRODUODENOSCOPY (EGD) WITH PROPOFOL N/A 04/03/2022  ? Procedure: ESOPHAGOGASTRODUODENOSCOPY (EGD) WITH PROPOFOL;  Surgeon: Harvel Quale, MD;  [facility];  Service: Gastroenterology;  Laterality: N/A;  ? EYE SURGERY Bilateral   ? cataract  ? HOT HEMOSTASIS  04/03/2022  ? Procedure: HOT HEMOSTASIS (ARGON PLASMA COAGULATION/BICAP);  Surgeon: Montez Morita, Quillian Quince, MD;  [facility];  Service: Gastroenterology;;  ? POLYPECTOMY  04/03/2022  ? Procedure: POLYPECTOMY;  Surgeon: Harvel Quale, MD;  [facility];  Service: Gastroenterology;;  ? POSTERIOR CERVICAL FUSION/FORAMINOTOMY N/A 05/26/2017  ? Procedure: RIGHT C4-5 FORAMINOTOMY WITH EXCISION OF HERNIATED [place];  Surgeon: Jessy Oto, MD;  Location: [facility];  Service: Orthopedics;  Laterality: N/A;  ? RADIOACTIVE SEED IMPLANT N/A 01/11/2016  ? Procedure: RADIOACTIVE SEED IMPLANT/BRACHYTHERAPY IMPLANT;  Surgeon: Franchot Gallo, MD;  Location: [facility];  Service: Urology;  Laterality: N/A;   73  seeds implanted ?no seeds founds in bladder   ? ? ?Family History  ?Problem Relation Age of Onset  ? Heart attack Mother   ? Coronary artery disease Father   ? Ulcerative colitis Father   ? Ulcers Father   ? Breast cancer Sister   ? COPD Brother   ? Pancreatic cancer Brother   ? Healthy Sister   ? Diabetes Brother   ? Cirrhosis Son   ? Seizures Son   ? ? ?Social History  ? ?Socioeconomic History  ? Marital status: Married  ?  Spouse name: Not on file  ? Number of children: 2  ? Years of education: Not on file  ? Highest education level: Not on file  ?Occupational History  ? Occupation: Retired Clinical biochemist  ?Tobacco Use  ? Smoking status: Former  ?  Packs/day: 1.00  ?  Years: 30.00  ?  Pack years: 30.00  ?  Types: Cigarettes  ?  Quit date: 05/21/1989  ?  Years since quitting: 32.9  ?  Passive exposure: Never  ? Smokeless tobacco: Never  ?Vaping Use  ? Vaping Use: Never used  ?Substance and Sexual Activity  ? Alcohol use: Yes  ?  Comment: 1 beer and 1 mix drink per day  ? Drug use: No  ? Sexual activity: Not on file  ?Other Topics Concern  ? Not on file  ?Social History Narrative  ? 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  ? ?Social Determinants of Health  ? ?Financial Resource Strain: Low Risk   ? Difficulty of Paying Living Expenses: Not hard at all  ?Food Insecurity: No Food Insecurity  ? Worried About Charity fundraiser in the Last Year: Never true  ? Ran Out of Food in the Last Year: Never true  ?Transportation Needs: No Transportation Needs  ? Lack of Transportation (Medical): No  ? Lack of Transportation (Non-Medical): No  ?Physical Activity: Sufficiently Active  ? Days of Exercise per Week: 7 days  ? Minutes of Exercise per Session: 60 min  ?Stress: No Stress Concern Present  ? Feeling of Stress : Not at all  ?Social Connections: Moderately Integrated  ? Frequency of Communication with Friends and Family: More than three times a week  ? Frequency of Social Gatherings with Friends and Family: More than three times a week  ? Attends Religious Services:  More than 4 times per year  ? Active Member of Clubs or Organizations: No  ? Attends Archivist Meetings: Never  ? Marital Status: Married  ?Intimate Partner Violence: Not At Risk  ? Fear of Current or Ex-Partner: No  ? Emotionally Abused: No  ? Physically Abused: No  ? Sexually Abused: No  ? ? ?Outpatient Medications Prior to Visit  ?Medication Sig Dispense Refill  ? Alcohol Swabs (DROPSAFE ALCOHOL PREP) 70 % PADS USE AS NEEDED TO CLEAN SKIN PRIOR TO FINGERPRICK AND INJECTION 100 each 5  ? allopurinol (ZYLOPRIM) 100 MG tablet TAKE 1/2 TABLET EVERY OTHER DAY 45 tablet 0  ? amLODipine (NORVASC) 5 MG tablet Take 1 tablet (  5 mg total) by mouth daily. 90 tablet 3  ? apixaban (ELIQUIS) 5 MG TABS tablet Take 1 tablet (5 mg total) by mouth 2 (two) times daily. 180 tablet 3  ? Blood Glucose Monitoring Suppl (ACCU-CHEK GUIDE) w/Device KIT 1 Units by Does not apply route 2 (two) times daily. 1 kit 3  ? Colchicine 0.6 MG CAPS Take 0.6 mg by mouth daily as needed (Gout).    ? Dupilumab (DUPIXENT) 300 MG/2ML SOPN Inject 1 application into the skin every 14 (fourteen) days. 300 mL 4  ? ferrous sulfate 325 (65 FE) MG EC tablet Take 1 tablet by mouth daily.    ? folic acid (FOLVITE) 081 MCG tablet Take 400 mcg by mouth daily.    ? glucose blood (ACCU-CHEK GUIDE) test strip Use as instructed 100 each 12  ? hydrALAZINE (APRESOLINE) 25 MG tablet Take 50 mg by mouth 3 (three) times daily.    ? Lancets (ACCU-CHEK SOFT TOUCH) lancets Use as instructed 100 each 12  ? leflunomide (ARAVA) 10 MG tablet Take 1 tablet (10 mg total) by mouth daily. 90 tablet 0  ? losartan (COZAAR) 25 MG tablet Take 25 mg by mouth daily.    ? nitroGLYCERIN (NITROSTAT) 0.4 MG SL tablet Place 0.4 mg under the tongue every 5 (five) minutes as needed for chest pain.     ? omeprazole (PRILOSEC) 40 MG capsule Take 1 capsule (40 mg total) by mouth daily. 90 capsule 3  ? rosuvastatin (CRESTOR) 40 MG tablet TAKE 1 TABLET EVERY DAY . STOP ATORVASTATIN 90 tablet  3  ? triamcinolone cream (KENALOG) 0.1 % APPLY 1 APPLICATION TOPICALLY DAILY AS NEEDED (RASH). (Patient taking differently: Apply 1 application. topically daily.) 454 g 3  ? ?No facility-administered medicati

## 2022-04-08 NOTE — Patient Instructions (Addendum)
I appreciate the opportunity to provide care to you today! ? ?-Please perform vestibular exercises for dizziness ?[email or web address] ?  ?Please pick up your prescription for Meclizine ? ?Meclizine Education: ?-Begins working in ~1 hour (take 1 hour prior to expected need) ?-May cause dry mouth, headache and blurred vision ?-Avoid alcohol, or other drugs that may cause drowsiness while using this medication ?  ? ?  ?It was a pleasure to see you and I look forward to continuing to work together on your health and well-being. ?Please do not hesitate to call the office if you need care or have questions about your care. ?  ?Have a wonderful day and week. ?With Gratitude, ?Alvira Monday MSN, FNP-BC ? ?

## 2022-04-09 ENCOUNTER — Ambulatory Visit: Payer: Medicare HMO | Admitting: Rheumatology

## 2022-04-09 DIAGNOSIS — M0600 Rheumatoid arthritis without rheumatoid factor, unspecified site: Secondary | ICD-10-CM

## 2022-04-09 DIAGNOSIS — Z8546 Personal history of malignant neoplasm of prostate: Secondary | ICD-10-CM

## 2022-04-09 DIAGNOSIS — Z8679 Personal history of other diseases of the circulatory system: Secondary | ICD-10-CM

## 2022-04-09 DIAGNOSIS — M19041 Primary osteoarthritis, right hand: Secondary | ICD-10-CM

## 2022-04-09 DIAGNOSIS — M1A09X0 Idiopathic chronic gout, multiple sites, without tophus (tophi): Secondary | ICD-10-CM

## 2022-04-09 DIAGNOSIS — Z79899 Other long term (current) drug therapy: Secondary | ICD-10-CM

## 2022-04-09 DIAGNOSIS — Z87448 Personal history of other diseases of urinary system: Secondary | ICD-10-CM

## 2022-04-09 DIAGNOSIS — Z8639 Personal history of other endocrine, nutritional and metabolic disease: Secondary | ICD-10-CM

## 2022-04-09 DIAGNOSIS — Z13820 Encounter for screening for osteoporosis: Secondary | ICD-10-CM

## 2022-04-09 DIAGNOSIS — M5136 Other intervertebral disc degeneration, lumbar region: Secondary | ICD-10-CM

## 2022-04-09 DIAGNOSIS — R42 Dizziness and giddiness: Secondary | ICD-10-CM | POA: Insufficient documentation

## 2022-04-09 DIAGNOSIS — Z8719 Personal history of other diseases of the digestive system: Secondary | ICD-10-CM

## 2022-04-09 NOTE — Assessment & Plan Note (Addendum)
Encouraged to performed vestibular exercises ?Meclizine Education: ?-Begins working in ~1 hour (take 1 hour prior to expected need) ?-May cause dry mouth, headache and blurred vision ?-Avoid alcohol, or other drugs that may cause drowsiness while using this medication ? ?

## 2022-04-09 NOTE — Progress Notes (Signed)
? ?Office Visit Note ? ?Patient: Jacob Farmer             ?Date of Birth: 08-Aug-1946           ?MRN: [id number]             ?PCP: Lindell Spar, MD ?Referring: Lindell Spar, MD ?Visit Date: 04/10/2022 ?Occupation: '@GUAROCC'$ @ ? ?Subjective:  ?Medication management ? ?History of Present Illness: Jacob Farmer is a 76 y.o. male with history of rheumatoid arthritis, osteoarthritis and gouty arthropathy.  He states he has been tolerating leflunomide 10 mg p.o. daily without any side effects.  He has not experienced any joint pain or joint swelling since the last visit.  He has occasional discomfort in his lower back when he is doing yard work.  He takes breaks as needed.  He denies any gout flare.  He has been taking allopurinol 50 mg every other day. ? ?Activities of Daily Living:  ?Patient reports morning stiffness for 0 minutes.   ?Patient Denies nocturnal pain.  ?Difficulty dressing/grooming: Denies ?Difficulty climbing stairs: Denies ?Difficulty getting out of chair: Denies ?Difficulty using hands for taps, buttons, cutlery, and/or writing: Denies ? ?Review of Systems  ?Constitutional:  Negative for fatigue.  ?HENT:  Negative for mouth sores, mouth dryness and nose dryness.   ?Eyes:  Negative for pain, itching and dryness.  ?Respiratory:  Negative for shortness of breath and difficulty breathing.   ?Cardiovascular:  Negative for chest pain and palpitations.  ?Gastrointestinal:  Negative for blood in stool, constipation and diarrhea.  ?Endocrine: Negative for increased urination.  ?Genitourinary:  Negative for difficulty urinating.  ?Musculoskeletal:  Negative for joint pain, joint pain, joint swelling, myalgias, morning stiffness, muscle tenderness and myalgias.  ?Skin:  Negative for color change, rash and redness.  ?Allergic/Immunologic: Negative for susceptible to infections.  ?Neurological:  Positive for dizziness. Negative for numbness, headaches, memory loss and weakness.  ?Hematological:  Negative for  bruising/bleeding tendency.  ?Psychiatric/Behavioral:  Negative for confusion.   ? ?PMFS History:  ?Patient Active Problem List  ? Diagnosis Date Noted  ? Dizziness 04/09/2022  ? Iron deficiency anemia 02/28/2022  ? Elevated liver transaminase level 02/28/2022  ? Encounter for general adult medical examination with abnormal findings 01/25/2022  ? Paroxysmal atrial fibrillation ([place]) 01/22/2022  ? Allergic dermatitis 09/12/2021  ? History of gastric ulcer   ? Herniated cervical disc 05/26/2017  ?  Class: Acute  ? Cervical spinal stenosis 05/26/2017  ? Primary osteoarthritis of both hands 02/04/2017  ? History of gout 02/04/2017  ? DDD (degenerative disc disease), lumbar 02/04/2017  ? High risk medication use 01/28/2017  ? History of prostate cancer 01/28/2017  ? Chronic kidney disease, stage 4 (severe) ([place]) 12/27/2011  ? Type 2 diabetes mellitus with hyperosmolarity without coma, without long-term current use of insulin ([place]) 12/27/2011  ? Rheumatoid arthritis ([place]) 01/04/2011  ? 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 SINUS BRADYCARDIA 01/12/2010  ? Coronary artery disease involving native coronary artery of native heart without angina pectoris 01/12/2010  ? Hyperlipidemia 01/08/2010  ? Essential hypertension 01/08/2010  ?  ?Past Medical History:  ?Diagnosis Date  ? CAD (coronary artery disease)   ? Acute myocardial infarction treated with TPA in 1990; 1999-stents to circumflex and RCA; residual total occlusion of the left anterior descending; normal ejection fraction; stress nuclear in 2001-distal anteroseptal ischemia; normal LV function  ? CKD (chronic kidney disease) stage 4, GFR 15-29 ml/min (HCC)   ?  First degree heart block   ? History of gastric ulcer   ? History of kidney stones   ? History of MI (myocardial infarction)   ? 1990-  treated w/ TPA  ? Hyperlipidemia   ? Hypertension   ? Jaundice   ? OA (osteoarthritis)   ? Prediabetes   ? Prostate cancer ([place])   ?  Stage T1c , Gleason 3+4,  PSA 4.7,  vol 24cc--  scheduled for radiative seed implants  ? Psoriatic arthritis ([place] [address])   ? RA (rheumatoid arthritis) ([place])   ? Dr. Toni Amend  ? Sinus bradycardia   ? Wears dentures   ?  ?Family History  ?Problem Relation Age of Onset  ? Heart attack Mother   ? Coronary artery disease Father   ? Ulcerative colitis Father   ? Ulcers Father   ? Breast cancer Sister   ? COPD Brother   ? Pancreatic cancer Brother   ? Healthy Sister   ? Diabetes Brother   ? Cirrhosis Son   ? Seizures Son   ? ?Past Surgical History:  ?Procedure Laterality Date  ? BIOPSY  08/27/2019  ? Procedure: BIOPSY;  Surgeon: Rogene Houston, MD;  [facility];  Service: Endoscopy;;  gastric  ? BIOPSY  04/03/2022  ? Procedure: BIOPSY;  Surgeon: Montez Morita, Quillian Quince, MD;  [facility];  Service: Gastroenterology;;  ? CARDIOVASCULAR STRESS TEST  2001  per Dr Lattie Haw clinic note  ? distal anteroseptal ischemia,  normal LVF  ? COLONOSCOPY  last one 2006 (approx)  ? COLONOSCOPY WITH PROPOFOL N/A 04/03/2022  ? Procedure: COLONOSCOPY WITH PROPOFOL;  Surgeon: Harvel Quale, MD;  [facility];  Service: Gastroenterology;  Laterality: N/A;  945  ? CORONARY ANGIOPLASTY WITH STENT PLACEMENT  1999  ? DES x2  to CFX and RCA/  residual total occlusion LAD,  normal LVEF  ? ESOPHAGOGASTRODUODENOSCOPY (EGD) WITH PROPOFOL N/A 08/27/2019  ? Procedure: ESOPHAGOGASTRODUODENOSCOPY (EGD) WITH PROPOFOL;  Surgeon: Rogene Houston, MD;  [facility];  Service: Endoscopy;  Laterality: N/A;  10:10  ? ESOPHAGOGASTRODUODENOSCOPY (EGD) WITH PROPOFOL N/A 04/03/2022  ? Procedure: ESOPHAGOGASTRODUODENOSCOPY (EGD) WITH PROPOFOL;  Surgeon: Harvel Quale, MD;  [facility];  Service: Gastroenterology;  Laterality: N/A;  ? EYE SURGERY Bilateral   ? cataract  ? HOT HEMOSTASIS  04/03/2022  ? Procedure: HOT HEMOSTASIS (ARGON PLASMA COAGULATION/BICAP);  Surgeon: Montez Morita,  Quillian Quince, MD;  [facility];  Service: Gastroenterology;;  ? POLYPECTOMY  04/03/2022  ? Procedure: POLYPECTOMY;  Surgeon: Harvel Quale, MD;  [facility];  Service: Gastroenterology;;  ? POSTERIOR CERVICAL FUSION/FORAMINOTOMY N/A 05/26/2017  ? Procedure: RIGHT C4-5 FORAMINOTOMY WITH EXCISION OF HERNIATED [place];  Surgeon: Jessy Oto, MD;  Location: [facility];  Service: Orthopedics;  Laterality: N/A;  ? RADIOACTIVE SEED IMPLANT N/A 01/11/2016  ? Procedure: RADIOACTIVE SEED IMPLANT/BRACHYTHERAPY IMPLANT;  Surgeon: Franchot Gallo, MD;  [facility];  Service: Urology;  Laterality: N/A;   73  seeds implanted ?no seeds founds in bladder  ? ?Social History  ? ?Social History Narrative  ? 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  ? ?Immunization History  ?Administered Date(s) Administered  ? Influenza, High Dose Seasonal PF 09/12/2017, 08/28/2019, 08/28/2019  ? Influenza-Unspecified 08/08/2020, 08/24/2021  ? PFIZER(Purple Top)SARS-COV-2 Vaccination 01/22/2020, 02/12/2020, 08/15/2020, 01/19/2021  ? Pneumococcal Polysaccharide-23 08/16/2009  ? Zoster Recombinat (Shingrix) 07/29/2019  ?  ? ?Objective: ?Vital Signs: BP (!) 167/75 (BP Location: Left Arm,  Patient Position: Sitting, Cuff Size: Normal)   Pulse 71   Ht '5\' 10"'$  (1.778 m)   Wt 187 lb 6.4 oz (85 kg)   BMI 26.89 kg/m?   ? ?Physical Exam ?Vitals and nursing note reviewed.  ?Constitutional:   ?   Appearance: He is well-developed.  ?HENT:  ?   Head: Normocephalic and atraumatic.  ?Eyes:  ?   Conjunctiva/sclera: Conjunctivae normal.  ?   Pupils: Pupils are equal, round, and reactive to light.  ?Cardiovascular:  ?   Rate and Rhythm: Normal rate and regular rhythm.  ?   Heart sounds: Normal heart sounds.  ?Pulmonary:  ?   Effort: Pulmonary effort is normal.  ?   Breath sounds: Normal breath sounds.  ?Abdominal:  ?   General: Bowel sounds are normal.  ?   Palpations: Abdomen is soft.  ?Musculoskeletal:  ?   Cervical  back: Normal range of motion and neck supple.  ?Skin: ?   General: Skin is warm and dry.  ?   Capillary Refill: Capillary refill takes less than 2 seconds.  ?Neurological:  ?   Mental Status: He is alert and orie

## 2022-04-10 ENCOUNTER — Ambulatory Visit: Payer: Medicare HMO | Admitting: Rheumatology

## 2022-04-10 ENCOUNTER — Encounter: Payer: Self-pay | Admitting: Rheumatology

## 2022-04-10 VITALS — BP 167/75 | HR 71 | Ht 70.0 in | Wt 187.4 lb

## 2022-04-10 DIAGNOSIS — Z8719 Personal history of other diseases of the digestive system: Secondary | ICD-10-CM

## 2022-04-10 DIAGNOSIS — Z8639 Personal history of other endocrine, nutritional and metabolic disease: Secondary | ICD-10-CM

## 2022-04-10 DIAGNOSIS — M0600 Rheumatoid arthritis without rheumatoid factor, unspecified site: Secondary | ICD-10-CM | POA: Diagnosis not present

## 2022-04-10 DIAGNOSIS — M19041 Primary osteoarthritis, right hand: Secondary | ICD-10-CM

## 2022-04-10 DIAGNOSIS — Z8546 Personal history of malignant neoplasm of prostate: Secondary | ICD-10-CM

## 2022-04-10 DIAGNOSIS — Z87448 Personal history of other diseases of urinary system: Secondary | ICD-10-CM | POA: Diagnosis not present

## 2022-04-10 DIAGNOSIS — Z79899 Other long term (current) drug therapy: Secondary | ICD-10-CM | POA: Diagnosis not present

## 2022-04-10 DIAGNOSIS — M1A09X0 Idiopathic chronic gout, multiple sites, without tophus (tophi): Secondary | ICD-10-CM

## 2022-04-10 DIAGNOSIS — Z8679 Personal history of other diseases of the circulatory system: Secondary | ICD-10-CM

## 2022-04-10 DIAGNOSIS — M5136 Other intervertebral disc degeneration, lumbar region: Secondary | ICD-10-CM

## 2022-04-10 DIAGNOSIS — M19042 Primary osteoarthritis, left hand: Secondary | ICD-10-CM

## 2022-04-10 DIAGNOSIS — M51369 Other intervertebral disc degeneration, lumbar region without mention of lumbar back pain or lower extremity pain: Secondary | ICD-10-CM

## 2022-04-10 DIAGNOSIS — Z13820 Encounter for screening for osteoporosis: Secondary | ICD-10-CM

## 2022-04-10 MED ORDER — ALLOPURINOL 100 MG PO TABS: ORAL_TABLET | ORAL | 0 refills | Status: DC

## 2022-04-10 MED ORDER — LEFLUNOMIDE 10 MG PO TABS: 10.0000 mg | ORAL_TABLET | Freq: Every day | ORAL | 0 refills | Status: DC

## 2022-04-10 NOTE — Patient Instructions (Signed)
Standing Labs ?We placed an order today for your standing lab work.  ? ?Please have your standing labs drawn in May and every 3 months ? ?If possible, please have your labs drawn 2 weeks prior to your appointment so that the provider can discuss your results at your appointment. ? ?Please note that you may see your imaging and lab results in [place] before we have reviewed them. ?We may be awaiting multiple results to interpret others before contacting you. ?Please allow our office up to 72 hours to thoroughly review all of the results before contacting the office for clarification of your results. ? ?We have open lab daily: ?Monday through Thursday from 1:30-4:30 PM and Friday from 1:30-4:00 PM ?at the office of Dr. Bo Merino, [facility].   ?Please be advised, all patients with office appointments requiring lab work will take precedent over walk-in lab work.  ?If possible, please come for your lab work on Monday and Friday afternoons, as you may experience shorter wait times. ?The office is located at [address], [place], [place], [place] [address] ?No appointment is necessary.   ?Labs are drawn by [facility]. Please bring your co-pay at the time of your lab draw.  You may receive a bill from [facility] for your lab work. ? ?Please note if you are on Hydroxychloroquine and and an order has been placed for a Hydroxychloroquine level, you will need to have it drawn 4 hours or more after your last dose. ? ?If you wish to have your labs drawn at another location, please call the office 24 hours in advance to send orders. ? ?If you have any questions regarding directions or hours of operation,  ?please call [phone].   ?As a reminder, please drink plenty of water prior to coming for your lab work. Thanks!  ? ?Vaccines ?You are taking a medication(s) that can suppress your immune system.  The following immunizations are recommended: ?Flu annually ?Covid-19  ?Td/Tdap (tetanus, diphtheria, pertussis)  every 10 years ?Pneumonia (Prevnar 15 then Pneumovax 23 at least 1 year apart.  Alternatively, can take Prevnar 20 without needing additional dose) ?Shingrix: 2 doses from 4 weeks to 6 months apart ? ?Please check with your PCP to make sure you are up to date.  ? ?If you have signs or symptoms of an infection or start antibiotics: ?First, call your PCP for workup of your infection. ?Hold your medication through the infection, until you complete your antibiotics, and until symptoms resolve if you take the following: ?Injectable medication (Actemra, Benlysta, Cimzia, Cosentyx, Enbrel, Humira, Kevzara, Orencia, Remicade, Simponi, [place], [place], [place]) ?Methotrexate ?Leflunomide [name]) ?Mycophenolate (Cellcept) ?Roma Kayser, or Rinvoq  ?

## 2022-05-07 ENCOUNTER — Telehealth: Payer: Self-pay | Admitting: Rheumatology

## 2022-05-07 ENCOUNTER — Other Ambulatory Visit: Payer: Self-pay | Admitting: *Deleted

## 2022-05-07 DIAGNOSIS — Z79899 Other long term (current) drug therapy: Secondary | ICD-10-CM

## 2022-05-07 DIAGNOSIS — M1A09X0 Idiopathic chronic gout, multiple sites, without tophus (tophi): Secondary | ICD-10-CM

## 2022-05-07 NOTE — Telephone Encounter (Signed)
Lab Orders released.  

## 2022-05-07 NOTE — Telephone Encounter (Signed)
Patient called requesting labwork orders be sent to [facility] in [place] on [address]   Patient will be going tomorrow, 05/08/22.

## 2022-05-09 DIAGNOSIS — Z79899 Other long term (current) drug therapy: Secondary | ICD-10-CM | POA: Diagnosis not present

## 2022-05-09 DIAGNOSIS — I5032 Chronic diastolic (congestive) heart failure: Secondary | ICD-10-CM | POA: Diagnosis not present

## 2022-05-09 DIAGNOSIS — D631 Anemia in chronic kidney disease: Secondary | ICD-10-CM | POA: Diagnosis not present

## 2022-05-09 DIAGNOSIS — N184 Chronic kidney disease, stage 4 (severe): Secondary | ICD-10-CM | POA: Diagnosis not present

## 2022-05-09 DIAGNOSIS — M1A09X0 Idiopathic chronic gout, multiple sites, without tophus (tophi): Secondary | ICD-10-CM | POA: Diagnosis not present

## 2022-05-09 DIAGNOSIS — I129 Hypertensive chronic kidney disease with stage 1 through stage 4 chronic kidney disease, or unspecified chronic kidney disease: Secondary | ICD-10-CM | POA: Diagnosis not present

## 2022-05-09 DIAGNOSIS — N189 Chronic kidney disease, unspecified: Secondary | ICD-10-CM | POA: Diagnosis not present

## 2022-05-09 DIAGNOSIS — R809 Proteinuria, unspecified: Secondary | ICD-10-CM | POA: Diagnosis not present

## 2022-05-09 DIAGNOSIS — E211 Secondary hyperparathyroidism, not elsewhere classified: Secondary | ICD-10-CM | POA: Diagnosis not present

## 2022-05-10 LAB — CBC WITH DIFFERENTIAL/PLATELET
Absolute Monocytes: 583 cells/uL (ref 200–950)
Basophils Absolute: 38 cells/uL (ref 0–200)
Basophils Relative: 0.7 %
Eosinophils Absolute: 221 cells/uL (ref 15–500)
Eosinophils Relative: 4.1 %
HCT: 38.6 % (ref 38.5–50.0)
Hemoglobin: 12.4 g/dL — ABNORMAL LOW (ref 13.2–17.1)
Lymphs Abs: 810 cells/uL — ABNORMAL LOW (ref 850–3900)
MCH: 29.5 pg (ref 27.0–33.0)
MCHC: 32.1 g/dL (ref 32.0–36.0)
MCV: 91.7 fL (ref 80.0–100.0)
MPV: 11.6 fL (ref 7.5–12.5)
Monocytes Relative: 10.8 %
Neutro Abs: 3748 cells/uL (ref 1500–7800)
Neutrophils Relative %: 69.4 %
Platelets: 139 10*3/uL — ABNORMAL LOW (ref 140–400)
RBC: 4.21 10*6/uL (ref 4.20–5.80)
RDW: 12.6 % (ref 11.0–15.0)
Total Lymphocyte: 15 %
WBC: 5.4 10*3/uL (ref 3.8–10.8)

## 2022-05-10 LAB — COMPLETE METABOLIC PANEL WITH GFR
AG Ratio: 1.5 (calc) (ref 1.0–2.5)
ALT: 12 U/L (ref 9–46)
AST: 14 U/L (ref 10–35)
Albumin: 3.9 g/dL (ref 3.6–5.1)
Alkaline phosphatase (APISO): 86 U/L (ref 35–144)
BUN/Creatinine Ratio: 12 (calc) (ref 6–22)
BUN: 33 mg/dL — ABNORMAL HIGH (ref 7–25)
CO2: 20 mmol/L (ref 20–32)
Calcium: 8.7 mg/dL (ref 8.6–10.3)
Chloride: 108 mmol/L (ref 98–110)
Creat: 2.76 mg/dL — ABNORMAL HIGH (ref 0.70–1.28)
Globulin: 2.6 g/dL (calc) (ref 1.9–3.7)
Glucose, Bld: 176 mg/dL — ABNORMAL HIGH (ref 65–99)
Potassium: 4.5 mmol/L (ref 3.5–5.3)
Sodium: 138 mmol/L (ref 135–146)
Total Bilirubin: 0.4 mg/dL (ref 0.2–1.2)
Total Protein: 6.5 g/dL (ref 6.1–8.1)
eGFR: 23 mL/min/{1.73_m2} — ABNORMAL LOW (ref >=60)

## 2022-05-10 LAB — URIC ACID: Uric Acid, Serum: 5 mg/dL (ref 4.0–8.0)

## 2022-05-10 NOTE — Progress Notes (Signed)
Glucose is elevated, creatinine is elevated.  Hemoglobin is remained stable.  Uric acid is in desirable range.  Please forward labs to his PCP.

## 2022-05-15 DIAGNOSIS — N170 Acute kidney failure with tubular necrosis: Secondary | ICD-10-CM | POA: Diagnosis not present

## 2022-05-15 DIAGNOSIS — R809 Proteinuria, unspecified: Secondary | ICD-10-CM | POA: Diagnosis not present

## 2022-05-15 DIAGNOSIS — I129 Hypertensive chronic kidney disease with stage 1 through stage 4 chronic kidney disease, or unspecified chronic kidney disease: Secondary | ICD-10-CM | POA: Diagnosis not present

## 2022-05-15 DIAGNOSIS — N184 Chronic kidney disease, stage 4 (severe): Secondary | ICD-10-CM | POA: Diagnosis not present

## 2022-05-15 DIAGNOSIS — D631 Anemia in chronic kidney disease: Secondary | ICD-10-CM | POA: Diagnosis not present

## 2022-05-15 DIAGNOSIS — I5032 Chronic diastolic (congestive) heart failure: Secondary | ICD-10-CM | POA: Diagnosis not present

## 2022-05-15 DIAGNOSIS — E211 Secondary hyperparathyroidism, not elsewhere classified: Secondary | ICD-10-CM | POA: Diagnosis not present

## 2022-05-15 DIAGNOSIS — N189 Chronic kidney disease, unspecified: Secondary | ICD-10-CM | POA: Diagnosis not present

## 2022-05-22 ENCOUNTER — Ambulatory Visit (INDEPENDENT_AMBULATORY_CARE_PROVIDER_SITE_OTHER): Payer: Medicare HMO

## 2022-05-22 ENCOUNTER — Other Ambulatory Visit: Payer: Self-pay | Admitting: Psychology

## 2022-05-22 ENCOUNTER — Ambulatory Visit: Payer: Medicare HMO | Admitting: Cardiology

## 2022-05-22 ENCOUNTER — Other Ambulatory Visit: Payer: Self-pay | Admitting: Cardiology

## 2022-05-22 ENCOUNTER — Encounter: Payer: Self-pay | Admitting: Cardiology

## 2022-05-22 VITALS — BP 150/74 | HR 72 | Ht 70.0 in | Wt 187.2 lb

## 2022-05-22 DIAGNOSIS — I4891 Unspecified atrial fibrillation: Secondary | ICD-10-CM

## 2022-05-22 DIAGNOSIS — R42 Dizziness and giddiness: Secondary | ICD-10-CM

## 2022-05-22 DIAGNOSIS — I495 Sick sinus syndrome: Secondary | ICD-10-CM | POA: Diagnosis not present

## 2022-05-22 DIAGNOSIS — D6869 Other thrombophilia: Secondary | ICD-10-CM | POA: Diagnosis not present

## 2022-05-22 DIAGNOSIS — I2510 Atherosclerotic heart disease of native coronary artery without angina pectoris: Secondary | ICD-10-CM | POA: Diagnosis not present

## 2022-05-22 NOTE — Progress Notes (Signed)
Clinical Summary Mr. Anfinson is a 76 y.o.male seen today for follow up of the following medical problems.    1. Tachy/Bradycardia/Afib - ER visit9/2021 with bradycardia. HRs mid 30s in nephrology clinic, sent to ER - has had prior issues with bradycardia, we had been weaning his home beta blocker. At ER visit beta blocker was stopped - no significant symptoms at that time, was discharged from ER with outpatient monitor.  - zio patch with frequent supraventricular ectopy, runs of atach. Rare episodes of sinus brady to 30s in AM hours, unclear if sleeping. Of note he reports no significant dizziness episodes while wearing monitor.        08/2020 event monitor: Min HR 32, Max HR 136, Avg 69. Frequent PACs, short runs of atach. Episode of severe sinus brady at 32 at 7am, unclear if sleeping     - no bleeding on eliquis  - episodes of lightheadedness/dizziness x 2.  - most recent episode 1 week ago at the bar. Sitting at bar sudden onset of dizzziness/weakness. Did not loss consciousness - episode about 3 weeks ago. Sudden lightheadedness, had to sit down. While driving home sudden onset dizziness, lightheadedness.  - nephrologist lowered hydralazine from tid to bid just a few days ago. Symptoms have improved        2. Orthostatic hypotension -at prior clinic visit SBP drops 36 points with standing, DBP 26 points.  - working to stay well hydrated   3. CAD  - multiple interventions as described below    - - no chest pains.        4. HTN  - compliant with meds - with history of orthostatic hypotension have tolerated higher bp's     5 Hyperlipidemia   - was off statin due to LFT elevation workup. He is back on statin, LFTs have normalized.    05/2021 TC 113 HDL 49 TG 82 LDL 48   4. CKD III - followed by renal Dr Theador Hawthorne   5. Prostate CA - had radioactive seeds placed, followed by urology.      6. Rheumatoid arthritis - followed by rheum       Past Medical  History:  Diagnosis Date   CAD (coronary artery disease)    Acute myocardial infarction treated with TPA in 1990; 1999-stents to circumflex and RCA; residual total occlusion of the left anterior descending; normal ejection fraction; stress nuclear in 2001-distal anteroseptal ischemia; normal LV function   CKD (chronic kidney disease) stage 4, GFR 15-29 ml/min (HCC)    First degree heart block    History of gastric ulcer    History of kidney stones    History of MI (myocardial infarction)    1990-  treated w/ TPA   Hyperlipidemia    Hypertension    Jaundice    OA (osteoarthritis)    Prediabetes    Prostate cancer (HCC)    Stage T1c , Gleason 3+4,  PSA 4.7,  vol 24cc--  scheduled for radiative seed implants   Psoriatic arthritis (HCC)    RA (rheumatoid arthritis) ([place])    Dr. Toni Amend   Sinus bradycardia    Wears dentures      No Known Allergies   Current Outpatient Medications  Medication Sig Dispense Refill   Alcohol Swabs (DROPSAFE ALCOHOL PREP) 70 % PADS USE AS NEEDED TO CLEAN SKIN PRIOR TO FINGERPRICK AND INJECTION 100 each 5   allopurinol (ZYLOPRIM) 100 MG tablet TAKE 1/2 TABLET EVERY OTHER DAY 45 tablet  0   amLODipine (NORVASC) 5 MG tablet Take 1 tablet (5 mg total) by mouth daily. 90 tablet 3   apixaban (ELIQUIS) 5 MG TABS tablet Take 1 tablet (5 mg total) by mouth 2 (two) times daily. 180 tablet 3   Blood Glucose Monitoring Suppl (ACCU-CHEK GUIDE) w/Device KIT 1 Units by Does not apply route 2 (two) times daily. 1 kit 3   Colchicine 0.6 MG CAPS Take 0.6 mg by mouth daily as needed (Gout).     Dupilumab (DUPIXENT) 300 MG/2ML SOPN Inject 1 application into the skin every 14 (fourteen) days. 300 mL 4   ferrous sulfate 325 (65 FE) MG EC tablet Take 1 tablet by mouth every other day.     folic acid (FOLVITE) 810 MCG tablet Take 400 mcg by mouth daily.     glucose blood (ACCU-CHEK GUIDE) test strip Use as instructed 100 each 12   hydrALAZINE (APRESOLINE) 25 MG tablet Take 50  mg by mouth 3 (three) times daily.     Lancets (ACCU-CHEK SOFT TOUCH) lancets Use as instructed 100 each 12   leflunomide (ARAVA) 10 MG tablet Take 1 tablet (10 mg total) by mouth daily. 90 tablet 0   losartan (COZAAR) 25 MG tablet Take 25 mg by mouth daily.     meclizine (ANTIVERT) 25 MG tablet Take 1 tablet (25 mg total) by mouth 3 (three) times daily as needed for dizziness. 30 tablet 0   nitroGLYCERIN (NITROSTAT) 0.4 MG SL tablet Place 0.4 mg under the tongue every 5 (five) minutes as needed for chest pain.      omeprazole (PRILOSEC) 40 MG capsule Take 1 capsule (40 mg total) by mouth daily. 90 capsule 3   rosuvastatin (CRESTOR) 40 MG tablet TAKE 1 TABLET EVERY DAY . STOP ATORVASTATIN 90 tablet 3   triamcinolone cream (KENALOG) 0.1 % APPLY 1 APPLICATION TOPICALLY DAILY AS NEEDED (RASH). (Patient taking differently: Apply 1 application. topically daily.) 454 g 3   No current facility-administered medications for this visit.     Past Surgical History:  Procedure Laterality Date   BIOPSY  08/27/2019   Procedure: BIOPSY;  Surgeon: Rogene Houston, MD;  [facility];  Service: Endoscopy;;  gastric   BIOPSY  04/03/2022   Procedure: BIOPSY;  Surgeon: Montez Morita, Quillian Quince, MD;  [facility];  Service: Gastroenterology;;   CARDIOVASCULAR STRESS TEST  2001  per Dr Lattie Haw clinic note   distal anteroseptal ischemia,  normal LVF   COLONOSCOPY  last one 2006 (approx)   COLONOSCOPY WITH PROPOFOL N/A 04/03/2022   Procedure: COLONOSCOPY WITH PROPOFOL;  Surgeon: Harvel Quale, MD;  [facility];  Service: Gastroenterology;  Laterality: N/A;  [place]   DES x2  to CFX and RCA/  residual total occlusion LAD,  normal LVEF   ESOPHAGOGASTRODUODENOSCOPY (EGD) WITH PROPOFOL N/A 08/27/2019   Procedure: ESOPHAGOGASTRODUODENOSCOPY (EGD) WITH PROPOFOL;  Surgeon: Rogene Houston, MD;  [facility];  Service:  Endoscopy;  Laterality: N/A;  10:10   ESOPHAGOGASTRODUODENOSCOPY (EGD) WITH PROPOFOL N/A 04/03/2022   Procedure: ESOPHAGOGASTRODUODENOSCOPY (EGD) WITH PROPOFOL;  Surgeon: Harvel Quale, MD;  [facility];  Service: Gastroenterology;  Laterality: N/A;   EYE SURGERY Bilateral    cataract   HOT HEMOSTASIS  04/03/2022   Procedure: HOT HEMOSTASIS (ARGON PLASMA COAGULATION/BICAP);  Surgeon: Montez Morita, Quillian Quince, MD;  [facility];  Service: Gastroenterology;;   POLYPECTOMY  04/03/2022  Procedure: POLYPECTOMY;  Surgeon: Harvel Quale, MD;  [facility];  Service: Gastroenterology;;   POSTERIOR CERVICAL FUSION/FORAMINOTOMY N/A 05/26/2017   Procedure: RIGHT C4-5 FORAMINOTOMY WITH EXCISION OF HERNIATED [place];  Surgeon: Jessy Oto, MD;  Location: [place];  Service: Orthopedics;  Laterality: N/A;   RADIOACTIVE SEED IMPLANT N/A 01/11/2016   Procedure: RADIOACTIVE SEED IMPLANT/BRACHYTHERAPY IMPLANT;  Surgeon: Franchot Gallo, MD;  Location: [facility];  Service: Urology;  Laterality: N/A;   29  seeds implanted no seeds founds in bladder     No Known Allergies    Family History  Problem Relation Age of Onset   Heart attack Mother    Coronary artery disease Father    Ulcerative colitis Father    Ulcers Father    Breast cancer Sister    COPD Brother    Pancreatic cancer Brother    Healthy Sister    Diabetes Brother    Cirrhosis Son    Seizures Son      Social History Mr. Lindroth reports that he quit smoking about 33 years ago. His smoking use included cigarettes. He has a 30.00 pack-year smoking history. He has never been exposed to tobacco smoke. He has never used smokeless tobacco. Mr. Sutcliffe reports current alcohol use.   Review of Systems CONSTITUTIONAL: No weight loss, fever, chills, weakness or fatigue.  HEENT: Eyes: No visual loss, blurred vision, double vision or yellow sclerae.No hearing loss, sneezing,  congestion, runny nose or sore throat.  SKIN: No rash or itching.  CARDIOVASCULAR: per hpi RESPIRATORY: No shortness of breath, cough or sputum.  GASTROINTESTINAL: No anorexia, nausea, vomiting or diarrhea. No abdominal pain or blood.  GENITOURINARY: No burning on urination, no polyuria NEUROLOGICAL: No headache, dizziness, syncope, paralysis, ataxia, numbness or tingling in the extremities. No change in bowel or bladder control.  MUSCULOSKELETAL: No muscle, back pain, joint pain or stiffness.  LYMPHATICS: No enlarged nodes. No history of splenectomy.  PSYCHIATRIC: No history of depression or anxiety.  ENDOCRINOLOGIC: No reports of sweating, cold or heat intolerance. No polyuria or polydipsia.  Marland Kitchen   Physical Examination Today's Vitals   05/22/22 0905  BP: (!) 150/74  Pulse: 72  SpO2: 98%  Weight: 187 lb 3.2 oz (84.9 kg)  Height: $Remove'5\' 10"'XdepyFg$  (1.778 m)   Body mass index is 26.86 kg/m.  Gen: resting comfortably, no acute distress HEENT: no scleral icterus, pupils equal round and reactive, no palptable cervical adenopathy,  CV: RRR, no m/r/g no jvd Resp: Clear to auscultation bilaterally GI: abdomen is soft, non-tender, non-distended, normal bowel sounds, no hepatosplenomegaly MSK: extremities are warm, no edema.  Skin: warm, no rash Neuro:  no focal deficits Psych: appropriate affect   Diagnostic Studies  08/2020 event monitor 7 day zio patch Min HR 32, Max HR 136, Avg HR 69 Frequent supraventricular ectopy in the form of isolated PACs (14%), couplets (2.6%), triplets (1.8%).Short runs of atrial tachycardia Rare ventricular ectopy in the form of isolated PVCs and couplets Episode of severe sinus bradycardia to 32 at 7AM, unclear if sleeping. No symptoms reported   Assessment and Plan  1. Tachycardia/bradycardia/Afib/acquired thrombophilia -new diagnosis of afib made in 11/2021 - EKG today shows he is back in SR - has not required av nodal agents, no symptoms - continue  eliquis, no bleeding issues - recent episodes of severe dizziness, near syncope. Has history of orthostatic hypotension however 2 of the episodes occurred while sitting. Prior bradycardia on monitor and baselien EKG with trafascicular block.  Concern that progressing episodes could be brady episodes, will repeat 14 day monitor.  -advised not to drive until we sort this out.      2. Orthostatic hypotension - continue aggressive hydration     3. CAD - no recent symptoms, continue current meds   4. HTN -accepting high bp's for him given orthosatic hypotension, continue current meds   5. Hyperlipidemia -LDL at goal, continue current meds   F/u 3 months. May need EP referal pending monitor results.       Arnoldo Lenis, M.D.

## 2022-05-22 NOTE — Patient Instructions (Addendum)
Medication Instructions:  Your physician recommends that you continue on your current medications as directed. Please refer to the Current Medication list given to you today.   Labwork: None  Testing/Procedures: None  Follow-Up: Follow up with Dr. Harl Bowie in 3 months.   Any Other Special Instructions Will Be Listed Below (If Applicable).     If you need a refill on your cardiac medications before your next appointment, please call your pharmacy.   ZIO XT- Long Term Monitor Instructions   Your physician has requested you wear your ZIO patch monitor___14____days.   This is a single patch monitor.  [employer] supplies one patch monitor per enrollment.  Additional stickers are not available.   Please do not apply patch if you will be having a Nuclear Stress Test, Echocardiogram, Cardiac CT, MRI, or Chest Xray during the time frame you would be wearing the monitor. The patch cannot be worn during these tests.  You cannot remove and re-apply the ZIO XT patch monitor.   Your ZIO patch monitor will be sent USPS Priority mail from [facility] directly to your home address. The monitor may also be mailed to a PO BOX if home delivery is not available.   It may take 3-5 days to receive your monitor after you have been enrolled.   Once you have received you monitor, please review enclosed instructions.  Your monitor has already been registered assigning a specific monitor serial # to you.   Applying the monitor   Shave hair from upper left chest.   Hold abrader disc by orange tab.  Rub abrader in 40 strokes over left upper chest as indicated in your monitor instructions.   Clean area with 4 enclosed alcohol pads .  Use all pads to assure are is cleaned thoroughly.  Let dry.   Apply patch as indicated in monitor instructions.  Patch will be place under collarbone on left side of chest with arrow pointing upward.   Rub patch adhesive wings for 2 minutes.Remove white label marked "1".   Remove white label marked "2".  Rub patch adhesive wings for 2 additional minutes.   While looking in a mirror, press and release button in center of patch.  A small green light will flash 3-4 times .  This will be your only indicator the monitor has been turned on.     Do not shower for the first 24 hours.  You may shower after the first 24 hours.   Press button if you feel a symptom. You will hear a small click.  Record Date, Time and Symptom in the Patient Log Book.   When you are ready to remove patch, follow instructions on last 2 pages of Patient Log Book.  Stick patch monitor onto last page of Patient Log Book.   Place Patient Log Book in [place] box.  Use locking tab on box and tape box closed securely.  The [employer] and [employer] has [employer] on it.  Please place in mailbox as soon as possible.  Your physician should have your test results approximately 7 days after the monitor has been mailed back to [facility].   Call [facility] at [phone] if you have questions regarding your ZIO XT patch monitor.  Call them immediately if you see an orange light blinking on your monitor.   If your monitor falls off in less than 4 days contact our Monitor department at [phone].  If your monitor becomes loose or falls off after 4  days call [employer] at [phone] for suggestions on securing your monitor.

## 2022-06-07 ENCOUNTER — Other Ambulatory Visit: Payer: Self-pay | Admitting: Family Medicine

## 2022-06-07 DIAGNOSIS — R42 Dizziness and giddiness: Secondary | ICD-10-CM

## 2022-06-10 DIAGNOSIS — R42 Dizziness and giddiness: Secondary | ICD-10-CM | POA: Diagnosis not present

## 2022-06-21 ENCOUNTER — Telehealth: Payer: Self-pay | Admitting: Cardiology

## 2022-06-21 DIAGNOSIS — R55 Syncope and collapse: Secondary | ICD-10-CM

## 2022-06-21 NOTE — Telephone Encounter (Signed)
Patient is call for his results to his monitor.

## 2022-06-21 NOTE — Telephone Encounter (Signed)
Monitor not resulted. Will forward to provider for results.

## 2022-06-24 NOTE — Telephone Encounter (Signed)
To Dr.Branch to result

## 2022-06-24 NOTE — Telephone Encounter (Signed)
I notified patient that we are awaiting MD to read report.

## 2022-06-24 NOTE — Telephone Encounter (Signed)
Pt's wife is calling back to check on the results and is requesting a call back.

## 2022-06-25 NOTE — Telephone Encounter (Signed)
Just resulted, please give him a call  Zandra Abts MD

## 2022-06-25 NOTE — Addendum Note (Signed)
Addended by: Barbarann Ehlers A on: 06/25/2022 09:19 AM   Modules accepted: Orders

## 2022-06-25 NOTE — Telephone Encounter (Addendum)
Arnoldo Lenis, MD  06/25/2022  8:44 AM EDT     No significant findings on heart monitor. I worry that the monitor was not long enough to capture an episode and still have some concerns if low heart rates may have contributed. He may need to consider a longer term monitor. THere is a monitor called a loop recorder which is very small microchip that can be implanted under the skin as an office procedure that can monitor heart rates for very long periods of time. , can we refer him to EP for syncope and consideration of loop recorder.   Zandra Abts MD     I discussed results with wife and they agree to see EP   The next available in [place] is September. I called patient back to see if they could go to the [place] office- I await a call back.Was told by front office that the apt's were filled for Dr.Taylor in August so [facility] with Dr.Allred is open.   I spoke with Mr.Novell and they will go to [facility] on 07/18/22 at 10 am to see Dr.Allred

## 2022-06-26 DIAGNOSIS — H524 Presbyopia: Secondary | ICD-10-CM | POA: Diagnosis not present

## 2022-06-26 LAB — HM DIABETES EYE EXAM

## 2022-07-06 ENCOUNTER — Other Ambulatory Visit: Payer: Self-pay | Admitting: Rheumatology

## 2022-07-06 DIAGNOSIS — M0600 Rheumatoid arthritis without rheumatoid factor, unspecified site: Secondary | ICD-10-CM

## 2022-07-08 ENCOUNTER — Telehealth: Payer: Self-pay | Admitting: Physical Medicine and Rehabilitation

## 2022-07-08 NOTE — Telephone Encounter (Signed)
Pt's wife [name] called requesting an back appt. Pt complains of back locking up. Pt phone number is [phone].

## 2022-07-08 NOTE — Telephone Encounter (Signed)
Next Visit: 09/10/2022  Last Visit: 04/10/2022  Last Fill: 04/10/2022  DX: Seronegative rheumatoid arthritis   Current Dose per office note 04/10/2022: Arava 10 mg 1 tablet by mouth daily  Labs: 05/09/2022 Glucose is elevated, creatinine is elevated.  Hemoglobin is remained stable.  Uric acid is in desirable range  Okay to refill Arava?

## 2022-07-17 ENCOUNTER — Encounter: Payer: Self-pay | Admitting: Physical Medicine and Rehabilitation

## 2022-07-17 ENCOUNTER — Ambulatory Visit: Payer: Medicare HMO | Admitting: Physical Medicine and Rehabilitation

## 2022-07-17 VITALS — BP 121/71 | HR 80 | Ht 70.0 in | Wt 188.0 lb

## 2022-07-17 DIAGNOSIS — M47816 Spondylosis without myelopathy or radiculopathy, lumbar region: Secondary | ICD-10-CM

## 2022-07-17 DIAGNOSIS — G8929 Other chronic pain: Secondary | ICD-10-CM | POA: Diagnosis not present

## 2022-07-17 DIAGNOSIS — M545 Low back pain, unspecified: Secondary | ICD-10-CM

## 2022-07-17 NOTE — Progress Notes (Signed)
NIXON KOLTON - 76 y.o. male MRN [id number]  Date of birth: 06/11/1946  Office Visit Note: Visit Date: 07/17/2022 PCP: Lindell Spar, MD Referred by: Lindell Spar, MD  Subjective: Chief Complaint  Patient presents with   Lower Back - Pain   HPI: SIDDH VANDEVENTER is a 76 y.o. male who comes in today for evaluation of chronic, worsening and severe bilateral lower back pain. Pain ongoing for several months and is exacerbated by movement and activity. He describes pain as sore and aching sensation, currently rates as 7 out of 10. He reports some relief of pain with home exercise regimen, rest and use of medications.  Patient's recent lumbar MRI exhibits multilevel facet arthrosis, most prominent at L5-S1. Grade 1 retrolisthesis of L2 on L3 and L5 on S1, mild to moderate spinal canal stenosis at L3-L4. No high-grade spinal canal stenosis noted. Patient has a history of multiple lumbar epidural steroid injections performed in our office over the years, most recent right L5 transforaminal epidural steroid injection in January provided significant and sustained relief of right sided radicular symptoms. Patient states severe pain is negatively impacting daily life. States he is usually very active and works outside frequently, however pain is limiting his abilities. Patient denies focal weakness, numbness and tingling. Patient denies recent trauma or falls.    Review of Systems  Musculoskeletal:  Positive for back pain.  Neurological:  Negative for tingling, sensory change, focal weakness and weakness.  All other systems reviewed and are negative.  Otherwise per HPI.  Assessment & Plan: Visit Diagnoses:    ICD-10-CM   1. Chronic bilateral low back pain without sciatica  M54.50    G89.29     2. Spondylosis without myelopathy or radiculopathy, lumbar region  M47.816     3. Facet arthropathy, lumbar  M47.816        Plan: Findings:  Chronic, worsening and severe bilateral lower back pain.  No radicular symptoms. Patient continues to have severe pain despite good conservative therapies such as home exercise regimen, rest and use of medications. Patients clinical presentation and exam are consistent with facet mediated pain. He does have increased pain when moving from sitting to standing position. Facet arthropathy is moderate at L4-L5 on recent lumbar MRI imaging. Next step is to perform bilateral L4-L5 facet joint injections under fluoroscopic guidance. I also discussed possibility of re-grouping with physical therapy. Patient instructed to let us know if radicular symptoms return, we would consider repeating lumbar epidural steroid injection as warranted. No red flag symptoms noted.     Meds & Orders: No orders of the defined types were placed in this encounter.  No orders of the defined types were placed in this encounter.   Follow-up: Return for Bilateral L4-L5 facet joint injections.   Procedures: No procedures performed      Clinical History: MRI lumbar spine:   INDICATION: Lumbar radiculopathy.   TECHNIQUE: Sagittal and axial T1 and T2-weighted sequences were performed. Additional sagittal STIR images were performed.   COMPARISON: None available at time of dictation.   FINDINGS:  There are 5 nonrib-bearing lumbar-type vertebrae. The conus medullaris terminates at a normal level. Grade 1 retrolisthesis of L2 on L3 and of L5 on S1. Severe L2-L3 intervertebral disc height loss with moderate height loss at L5-S1. The vertebral body heights are maintained. Edematous endplate signal changes at L2-L3 and less so at L3-4, nonspecific but likely degenerative in etiology. Colonic diverticulosis.   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   L1-2: No significant spinal canal or neural foraminal stenosis.  L2-3: Disc bulge contributes to moderate spinal canal stenosis. Disc bulge and facet arthrosis contribute to mild/moderate  left neural foraminal stenosis.  L3-4: Disc bulge contributes to mild/moderate spinal canal stenosis and along with facet arthrosis contributes to mild left and moderate right neural foraminal stenosis. Disc bulge. No significant spinal canal stenosis. Mild/moderate left and mild right neural foraminal stenosis. Facet arthrosis.  L4-5: Disc bulge. No significant spinal canal stenosis. Posterior marginal osteophytosis and facet arthrosis contribute to severe bilateral neural foraminal stenosis.  L5-S1: No significant spinal canal or neural foraminal stenosis.    IMPRESSION:  Degenerative changes of the lumbar spine most prominent at L5-S1.   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 A CTA would better assess this.   Electronically Signed by: Linden Dolin on 01/25/2022 10:35 AM   He reports that he quit smoking about 33 years ago. His smoking use included cigarettes. He has a 30.00 pack-year smoking history. He has never been exposed to tobacco smoke. He has never used smokeless tobacco.  Recent Labs    11/01/21 1009 01/22/22 1057 05/09/22 1016  HGBA1C  --  6.1  6.1  --   LABURIC 5.5  --  5.0    Objective:  VS:  HT:'5\' 10"'$  (177.8 cm)   WT:188 lb (85.3 kg)  BMI:26.98    BP:121/71  HR:80bpm  TEMP: ( )  RESP:  Physical Exam Vitals and nursing note reviewed.  HENT:     Head: Normocephalic and atraumatic.     Right Ear: External ear normal.     Left Ear: External ear normal.     Nose: Nose normal.     Mouth/Throat:     Mouth: Mucous membranes are moist.  Eyes:     Extraocular Movements: Extraocular movements intact.  Cardiovascular:     Rate and Rhythm: Normal rate.     Pulses: Normal pulses.  Pulmonary:     Effort: Pulmonary effort is normal.  Abdominal:     General: Abdomen is flat. There is no distension.  Musculoskeletal:        General: Tenderness present.     Cervical back: Normal range of motion.      Comments: Pt is slow to rise from seated position to standing. Concordant low back pain with facet loading, lumbar spine extension and rotation. Strong distal strength without clonus, no pain upon palpation of greater trochanters. Sensation intact bilaterally. Walks independently, gait steady.   Skin:    General: Skin is warm and dry.     Capillary Refill: Capillary refill takes less than 2 seconds.  Neurological:     General: No focal deficit present.     Mental Status: He is alert and oriented to person, place, and time.  Psychiatric:        Mood and Affect: Mood normal.        Behavior: Behavior normal.     Ortho Exam  Imaging: No results found.  Past Medical/Family/Surgical/Social History: Medications & Allergies reviewed per EMR, new medications updated. Patient Active Problem List   Diagnosis Date Noted   Dizziness 04/09/2022   Iron deficiency anemia 02/28/2022   Elevated liver transaminase level 02/28/2022   Encounter for general adult medical examination with abnormal findings 01/25/2022   Paroxysmal atrial fibrillation ([place]) 01/22/2022   Allergic dermatitis 09/12/2021   History of gastric ulcer    Herniated  cervical disc 05/26/2017    Class: Acute   Cervical spinal stenosis 05/26/2017   Primary osteoarthritis of both hands 02/04/2017   History of gout 02/04/2017   DDD (degenerative disc disease), lumbar 02/04/2017   High risk medication use 01/28/2017   History of prostate cancer 01/28/2017   Chronic kidney disease, stage 4 (severe) ([place]) 12/27/2011   Type 2 diabetes mellitus with hyperosmolarity without coma, without long-term current use of insulin ([place]) 12/27/2011   Rheumatoid arthritis ([place]) 01/04/2011   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 10/02/2010   SINUS BRADYCARDIA 01/12/2010   Coronary artery disease involving native coronary artery of native heart without angina pectoris 01/12/2010   Hyperlipidemia 01/08/2010    Essential hypertension 01/08/2010   Past Medical History:  Diagnosis Date   CAD (coronary artery disease)    Acute myocardial infarction treated with TPA in 1990; 1999-stents to circumflex and RCA; residual total occlusion of the left anterior descending; normal ejection fraction; stress nuclear in 2001-distal anteroseptal ischemia; normal LV function   CKD (chronic kidney disease) stage 4, GFR 15-29 ml/min (HCC)    First degree heart block    History of gastric ulcer    History of kidney stones    History of MI (myocardial infarction)    1990-  treated w/ TPA   Hyperlipidemia    Hypertension    Jaundice    OA (osteoarthritis)    Prediabetes    Prostate cancer (HCC)    Stage T1c , Gleason 3+4,  PSA 4.7,  vol 24cc--  scheduled for radiative seed implants   Psoriatic arthritis ([place])    RA (rheumatoid arthritis) ([place])    Dr. Toni Amend   Sinus bradycardia    Wears dentures    Family History  Problem Relation Age of Onset   Heart attack Mother    Coronary artery disease Father    Ulcerative colitis Father    Ulcers Father    Breast cancer Sister    COPD Brother    Pancreatic cancer Brother    Healthy Sister    Diabetes Brother    Cirrhosis Son    Seizures Son    Past Surgical History:  Procedure Laterality Date   BIOPSY  08/27/2019   Procedure: BIOPSY;  Surgeon: Rogene Houston, MD;  [facility];  Service: Endoscopy;;  gastric   BIOPSY  04/03/2022   Procedure: BIOPSY;  Surgeon: Montez Morita, Quillian Quince, MD;  [facility];  Service: Gastroenterology;;   CARDIOVASCULAR STRESS TEST  2001  per Dr Lattie Haw clinic note   distal anteroseptal ischemia,  normal LVF   COLONOSCOPY  last one 2006 (approx)   COLONOSCOPY WITH PROPOFOL N/A 04/03/2022   Procedure: COLONOSCOPY WITH PROPOFOL;  Surgeon: Harvel Quale, MD;  [facility];  Service: Gastroenterology;  Laterality: N/A;  [place]   DES x2   to CFX and RCA/  residual total occlusion LAD,  normal LVEF   ESOPHAGOGASTRODUODENOSCOPY (EGD) WITH PROPOFOL N/A 08/27/2019   Procedure: ESOPHAGOGASTRODUODENOSCOPY (EGD) WITH PROPOFOL;  Surgeon: Rogene Houston, MD;  [facility];  Service: Endoscopy;  Laterality: N/A;  10:10   ESOPHAGOGASTRODUODENOSCOPY (EGD) WITH PROPOFOL N/A 04/03/2022   Procedure: ESOPHAGOGASTRODUODENOSCOPY (EGD) WITH PROPOFOL;  Surgeon: Harvel Quale, MD;  [facility];  Service: Gastroenterology;  Laterality: N/A;   EYE SURGERY Bilateral    cataract   HOT HEMOSTASIS  04/03/2022   Procedure: HOT  HEMOSTASIS (ARGON PLASMA COAGULATION/BICAP);  Surgeon: Montez Morita, Quillian Quince, MD;  [facility];  Service: Gastroenterology;;   POLYPECTOMY  04/03/2022   Procedure: POLYPECTOMY;  Surgeon: Harvel Quale, MD;  [facility];  Service: Gastroenterology;;   POSTERIOR CERVICAL FUSION/FORAMINOTOMY N/A 05/26/2017   Procedure: RIGHT C4-5 FORAMINOTOMY WITH EXCISION OF HERNIATED [place];  Surgeon: Jessy Oto, MD;  Location: [facility];  Service: Orthopedics;  Laterality: N/A;   RADIOACTIVE SEED IMPLANT N/A 01/11/2016   Procedure: RADIOACTIVE SEED IMPLANT/BRACHYTHERAPY IMPLANT;  Surgeon: Franchot Gallo, MD;  Location[facility];  Service: Urology;  Laterality: N/A;   88  seeds implanted no seeds founds in bladder   Social History   Occupational History   Occupation: Retired Clinical biochemist  Tobacco Use   Smoking status: Former    Packs/day: 1.00    Years: 30.00    Total pack years: 30.00    Types: Cigarettes    Quit date: 05/21/1989    Years since quitting: 33.1    Passive exposure: Never   Smokeless tobacco: Never  Vaping Use   Vaping Use: Never used  Substance and Sexual Activity   Alcohol use: Yes    Comment: 1 beer and 1 mix drink per day   Drug use: No   Sexual activity: Not on file

## 2022-07-17 NOTE — Progress Notes (Signed)
Numeric Pain Rating Scale and Functional Assessment Average Pain 8   In the last MONTH (on 0-10 scale) has pain interfered with the following?  1. General activity like being  able to carry out your everyday physical activities such as walking, climbing stairs, carrying groceries, or moving a chair?  Rating(8) Lower back pain/spasms. Feels very weak in both knees.   +Driver, -BT, -Dye Allergies.

## 2022-07-18 ENCOUNTER — Encounter: Payer: Self-pay | Admitting: *Deleted

## 2022-07-18 ENCOUNTER — Other Ambulatory Visit (HOSPITAL_BASED_OUTPATIENT_CLINIC_OR_DEPARTMENT_OTHER): Payer: Self-pay | Admitting: Internal Medicine

## 2022-07-18 ENCOUNTER — Encounter (HOSPITAL_BASED_OUTPATIENT_CLINIC_OR_DEPARTMENT_OTHER): Payer: Self-pay | Admitting: Internal Medicine

## 2022-07-18 ENCOUNTER — Ambulatory Visit (HOSPITAL_BASED_OUTPATIENT_CLINIC_OR_DEPARTMENT_OTHER): Payer: Medicare HMO | Admitting: Internal Medicine

## 2022-07-18 VITALS — BP 156/74 | HR 97 | Ht 68.0 in | Wt 185.0 lb

## 2022-07-18 DIAGNOSIS — I480 Paroxysmal atrial fibrillation: Secondary | ICD-10-CM

## 2022-07-18 DIAGNOSIS — R42 Dizziness and giddiness: Secondary | ICD-10-CM

## 2022-07-18 DIAGNOSIS — I495 Sick sinus syndrome: Secondary | ICD-10-CM | POA: Diagnosis not present

## 2022-07-18 DIAGNOSIS — D6869 Other thrombophilia: Secondary | ICD-10-CM | POA: Diagnosis not present

## 2022-07-18 NOTE — Progress Notes (Signed)
Patient ID: Jacob Farmer, male   DOB: June 06, 1946, 76 y.o.   MRN: [id number] Patient enrolled for [employer] to ship a 30 day cardiac event monitor to his address on file.  Dr. Rayann Heman to read.

## 2022-07-18 NOTE — Patient Instructions (Addendum)
Medication Instructions:  Your physician recommends that you continue on your current medications as directed. Please refer to the Current Medication list given to you today. *If you need a refill on your cardiac medications before your next appointment, please call your pharmacy*  Lab Work: None ordered. If you have labs (blood work) drawn today and your tests are completely normal, you will receive your results only by: [place] (if you have MyChart) OR A paper copy in the mail If you have any lab test that is abnormal or we need to change your treatment, we will call you to review the results.  Testing/Procedures: None ordered.  Follow-Up: At [facility], you and your health needs are our priority.  As part of our continuing mission to provide you with exceptional heart care, we have created designated Provider Care Teams.  These Care Teams include your primary Cardiologist (physician) and Advanced Practice Providers (APPs -  Physician Assistants and Nurse Practitioners) who all work together to provide you with the care you need, when you need it.  Your next appointment:   Your physician wants you to follow-up in: 6 weeks with Dr. Cristopher Peru in [place] will receive a reminder letter in the mail two months in advance. If you don't receive a letter, please call our office to schedule the follow-up appointment.  [employer] Cardiac Event Monitor Instructions Your physician has requested you wear your cardiac event monitor for _____ days, (1-30). [employer] may call or text to confirm a shipping address. The monitor will be sent to a land address via UPS. [employer] will not ship a monitor to a PO BOX. It typically takes 3-5 days to receive your monitor after it has been enrolled. [employer] will assist with USPS tracking if your package is delayed. The telephone number for [employer] is [phone]. Once you have received your monitor, please review the enclosed  instructions. Instruction tutorials can also be viewed under help and settings on the enclosed cell phone. Your monitor has already been registered assigning a specific monitor serial # to you.  Applying the monitor Remove cell phone from case and turn it on. The cell phone works as Dealer and needs to be within [employer] of you at all times. The cell phone will need to be charged on a daily basis. We recommend you plug the cell phone into the enclosed charger at your bedside table every night.  Monitor batteries: You will receive two monitor batteries labelled #1 and #2. These are your recorders. Plug battery #2 onto the second connection on the enclosed charger. Keep one battery on the charger at all times. This will keep the monitor battery deactivated. It will also keep it fully charged for when you need to switch your monitor batteries. A small light will be blinking on the battery emblem when it is charging. The light on the battery emblem will remain on when the battery is fully charged.  Open package of a Monitor strip. Insert battery #1 into black hood on strip and gently squeeze monitor battery onto connection as indicated in instruction booklet. Set aside while preparing skin.  Choose location for your strip, vertical or horizontal, as indicated in the instruction booklet. Shave to remove all hair from location. There cannot be any lotions, oils, powders, or colognes on skin where monitor is to be applied. Wipe skin clean with enclosed Saline wipe. Dry skin completely.  Peel paper labeled #1 off the back of the Monitor strip exposing the  adhesive. Place the monitor on the chest in the vertical or horizontal position shown in the instruction booklet. One arrow on the monitor strip must be pointing upward. Carefully remove paper labeled #2, attaching remainder of strip to your skin. Try not to create any folds or wrinkles in the strip as you apply it.  Firmly press and  release the circle in the center of the monitor battery. You will hear a small beep. This is turning the monitor battery on. The heart emblem on the monitor battery will light up every 5 seconds if the monitor battery in turned on and connected to the patient securely. Do not push and hold the circle down as this turns the monitor battery off. The cell phone will locate the monitor battery. A screen will appear on the cell phone checking the connection of your monitor strip. This may read poor connection initially but change to good connection within the next minute. Once your monitor accepts the connection you will hear a series of 3 beeps followed by a climbing crescendo of beeps. A screen will appear on the cell phone showing the two monitor strip placement options. Touch the picture that demonstrates where you applied the monitor strip.  Your monitor strip and battery are waterproof. You are able to shower, bathe, or swim with the monitor on. They just ask you do not submerge deeper than 3 feet underwater. We recommend removing the monitor if you are swimming in a lake, river, or ocean.  Your monitor battery will need to be switched to a fully charged monitor battery approximately once a week. The cell phone will alert you of an action which needs to be made.  On the cell phone, tap for details to reveal connection status, monitor battery status, and cell phone battery status. The green dots indicates your monitor is in good status. A red dot indicates there is something that needs your attention.  To record a symptom, click the circle on the monitor battery. In 30-60 seconds a list of symptoms will appear on the cell phone. Select your symptom and tap save. Your monitor will record a sustained or significant arrhythmia regardless of you clicking the button. Some patients do not feel the heart rhythm irregularities. [employer] will notify us of any serious or critical events.  Refer to  instruction booklet for instructions on switching batteries, changing strips, the Do not disturb or Pause features, or any additional questions.  Call [employer] at [phone], to confirm your monitor is transmitting and record your baseline. They will answer any questions you may have regarding the monitor instructions at that time.  Returning the monitor to [place] all equipment back into blue box. Peel off strip of paper to expose adhesive and close box securely. There is a prepaid UPS shipping label on this box. Drop in a UPS drop box, or at a UPS facility like [employer]. You may also contact [employer] to arrange UPS to pick up monitor package at your home.

## 2022-07-18 NOTE — Progress Notes (Signed)
Electrophysiology Office Note   Date:  07/18/2022   ID:  Vishwa, Dais 1946/08/20, MRN [id number]  PCP:  Lindell Spar, MD  Cardiologist:  Dr Harl Bowie  CC: dizziness   History of Present Illness: Jacob Farmer is a 76 y.o. male who presents today for electrophysiology consultation for Dr Harl Bowie. The patient has a longstanding history of sinus bradycardia, RBBB, and first degree AV block.  He has been taken off of all AV nodal agents.  He continues to have episodes of dizziness.  He describes rather abrupt onset of dizziness with presyncope, typically lasting less than a minute. Though some of his episodes are postural in nature, he has had episodes of abrupt presyncope while seated and also while driving.  He reports that things became "fuzzy" and he felt like he would pass out.  He regarding composure prior to passing out.  His wife notes that he has a prodrome prior to events where he "doesn't look right".  Episodes occur several time per week. He recently wore an event monitor (personally reviewed) which showed sinus rhythm.  He did have some bradycardia, mostly in the early morning hours as well as afib with a slow ventricular response noted.  There were no prolonged pauses or AV block events.   Today, he denies symptoms of palpitations, chest pain, shortness of breath,  bleeding, or neurologic sequela. The patient is tolerating medications without difficulties and is otherwise without complaint today.    Past Medical History:  Diagnosis Date   CAD (coronary artery disease)    Acute myocardial infarction treated with TPA in 1990; 1999-stents to circumflex and RCA; residual total occlusion of the left anterior descending; normal ejection fraction; stress nuclear in 2001-distal anteroseptal ischemia; normal LV function   CKD (chronic kidney disease) stage 4, GFR 15-29 ml/min (HCC)    First degree heart block    History of gastric ulcer    History of kidney stones    History  of MI (myocardial infarction)    1990-  treated w/ TPA   Hyperlipidemia    Hypertension    Jaundice    OA (osteoarthritis)    Prediabetes    Prostate cancer ([place])    Stage T1c , Gleason 3+4,  PSA 4.7,  vol 24cc--  scheduled for radiative seed implants   Psoriatic arthritis ([place])    RA (rheumatoid arthritis) ([place])    Dr. Toni Amend   Sinus bradycardia    Wears dentures    Past Surgical History:  Procedure Laterality Date   BIOPSY  08/27/2019   Procedure: BIOPSY;  Surgeon: Rogene Houston, MD;  [facility];  Service: Endoscopy;;  gastric   BIOPSY  04/03/2022   Procedure: BIOPSY;  Surgeon: Montez Morita, Quillian Quince, MD;  [facility];  Service: Gastroenterology;;   CARDIOVASCULAR STRESS TEST  2001  per Dr Lattie Haw clinic note   distal anteroseptal ischemia,  normal LVF   COLONOSCOPY  last one 2006 (approx)   COLONOSCOPY WITH PROPOFOL N/A 04/03/2022   Procedure: COLONOSCOPY WITH PROPOFOL;  Surgeon: Harvel Quale, MD;  [facility];  Service: Gastroenterology;  Laterality: N/A;  [place]   DES x2  to CFX and RCA/  residual total occlusion LAD,  normal LVEF   ESOPHAGOGASTRODUODENOSCOPY (EGD) WITH PROPOFOL N/A 08/27/2019   Procedure: ESOPHAGOGASTRODUODENOSCOPY (EGD) WITH PROPOFOL;  Surgeon: Rogene Houston, MD;  [facility];  Service:  Endoscopy;  Laterality: N/A;  10:10   ESOPHAGOGASTRODUODENOSCOPY (EGD) WITH PROPOFOL N/A 04/03/2022   Procedure: ESOPHAGOGASTRODUODENOSCOPY (EGD) WITH PROPOFOL;  Surgeon: Harvel Quale, MD;  [facility];  Service: Gastroenterology;  Laterality: N/A;   EYE SURGERY Bilateral    cataract   HOT HEMOSTASIS  04/03/2022   Procedure: HOT HEMOSTASIS (ARGON PLASMA COAGULATION/BICAP);  Surgeon: Montez Morita, Quillian Quince, MD;  [facility];  Service: Gastroenterology;;   POLYPECTOMY  04/03/2022   Procedure: POLYPECTOMY;  Surgeon:  Harvel Quale, MD;  [facility];  Service: Gastroenterology;;   POSTERIOR CERVICAL FUSION/FORAMINOTOMY N/A 05/26/2017   Procedure: RIGHT C4-5 FORAMINOTOMY WITH EXCISION OF HERNIATED [place];  Surgeon: Jessy Oto, MD;  Location: [facility];  Service: Orthopedics;  Laterality: N/A;   RADIOACTIVE SEED IMPLANT N/A 01/11/2016   Procedure: RADIOACTIVE SEED IMPLANT/BRACHYTHERAPY IMPLANT;  Surgeon: Franchot Gallo, MD;  [facility];  Service: Urology;  Laterality: N/A;   73  seeds implanted no seeds founds in bladder     Current Outpatient Medications  Medication Sig Dispense Refill   Alcohol Swabs (DROPSAFE ALCOHOL PREP) 70 % PADS USE AS NEEDED TO CLEAN SKIN PRIOR TO FINGERPRICK AND INJECTION 100 each 5   allopurinol (ZYLOPRIM) 100 MG tablet TAKE 1/2 TABLET EVERY OTHER DAY 45 tablet 0   amLODipine (NORVASC) 5 MG tablet Take 1 tablet (5 mg total) by mouth daily. 90 tablet 3   apixaban (ELIQUIS) 5 MG TABS tablet Take 1 tablet (5 mg total) by mouth 2 (two) times daily. 180 tablet 3   Blood Glucose Monitoring Suppl (ACCU-CHEK GUIDE) w/Device KIT 1 Units by Does not apply route 2 (two) times daily. 1 kit 3   Colchicine 0.6 MG CAPS Take 0.6 mg by mouth daily as needed (Gout).     dapagliflozin propanediol (FARXIGA) 5 MG TABS tablet Take by mouth daily.     Dupilumab (DUPIXENT) 300 MG/2ML SOPN Inject 1 application into the skin every 14 (fourteen) days. 300 mL 4   ferrous sulfate 325 (65 FE) MG EC tablet Take 1 tablet by mouth every other day.     folic acid (FOLVITE) 449 MCG tablet Take 400 mcg by mouth daily.     glucose blood (ACCU-CHEK GUIDE) test strip Use as instructed 100 each 12   hydrALAZINE (APRESOLINE) 25 MG tablet Take 50 mg by mouth in the morning and at bedtime.     Lancets (ACCU-CHEK SOFT TOUCH) lancets Use as instructed 100 each 12   leflunomide (ARAVA) 10 MG tablet TAKE 1 TABLET EVERY DAY 90 tablet 0   losartan (COZAAR) 50 MG tablet Take 50 mg  by mouth daily.     meclizine (ANTIVERT) 25 MG tablet TAKE 1 TABLET THREE TIMES DAILY AS NEEDED FOR  DIZZINESS 30 tablet 0   nitroGLYCERIN (NITROSTAT) 0.4 MG SL tablet Place 0.4 mg under the tongue every 5 (five) minutes as needed for chest pain.      omeprazole (PRILOSEC) 40 MG capsule Take 1 capsule (40 mg total) by mouth daily. 90 capsule 3   rosuvastatin (CRESTOR) 40 MG tablet TAKE 1 TABLET EVERY DAY . STOP ATORVASTATIN 90 tablet 3   triamcinolone cream (KENALOG) 0.1 % APPLY 1 APPLICATION TOPICALLY DAILY AS NEEDED (RASH). (Patient taking differently: Apply 1 application  topically daily.) 454 g 3   No current facility-administered medications for this visit.    Allergies:   Patient has no known allergies.   Social History:  The patient  reports that  he quit smoking about 33 years ago. His smoking use included cigarettes. He has a 30.00 pack-year smoking history. He has never been exposed to tobacco smoke. He has never used smokeless tobacco. He reports current alcohol use. He reports that he does not use drugs.   Family History:  The patient's  family history includes Breast cancer in his sister; COPD in his brother; Cirrhosis in his son; Coronary artery disease in his father; Diabetes in his brother; Healthy in his sister; Heart attack in his mother; Pancreatic cancer in his brother; Seizures in his son; Ulcerative colitis in his father; Ulcers in his father.    ROS:  Please see the history of present illness.   All other systems are personally reviewed and negative.    PHYSICAL EXAM: VS:  BP (!) 156/74   Pulse 97   Ht _0  (1.727 m)   Wt 185 lb (83.9 kg)   BMI 28.13 kg/m  , BMI Body mass index is 28.13 kg/m. GEN: Well nourished, well developed, in no acute distress HEENT: normal Neck: carotid massage performed without hypersensitivity documented. Cardiac: RRR; no murmurs, rubs, or gallops,no edema  Respiratory:  clear to auscultation bilaterally, normal work of breathing GI:  soft, nontender, nondistended, + BS MS: no deformity or atrophy Skin: warm and dry  Neuro:  Strength and sensation are intact Psych: euthymic mood, full affect  EKG:  EKG is ordered today. The ekg ordered today is personally reviewed and shows sinus rhythm 97 bpm, RBBB, first degree AV block   Recent Labs: 05/09/2022: ALT 12; BUN 33; Creat 2.76; Hemoglobin 12.4; Platelets 139; Potassium 4.5; Sodium 138  personally reviewed   Lipid Panel     Component Value Date/Time   CHOL 113 06/14/2021 1322   TRIG 82 06/14/2021 1322   HDL 49 06/14/2021 1322   CHOLHDL 2.3 06/14/2021 1322   VLDL 44 (H) 01/05/2011 2022   LDLCALC 48 06/14/2021 1322   personally reviewed   Wt Readings from Last 3 Encounters:  07/18/22 185 lb (83.9 kg)  07/17/22 188 lb (85.3 kg)  05/22/22 187 lb 3.2 oz (84.9 kg)      Other studies personally reviewed: Additional studies/ records that were reviewed today include: echo, event      ASSESSMENT AND PLAN:  1.  Dizziness Almost certainly bradycardic in nature, though he has postural dizziness also. Prior ecgs are reviewed and clearly document sinus node dysfunction.  He has AV nodal disease also and the recent event monitor did show slow ventricular response to AFib. He is not on any AV nodal agents. I would favor pacing or long term monitoring. Given the very frequent nature of events, I would advise that we monitor for an additional 30 days.  I will have him follow-up  with Dr Lovena Le for further consideration of PPM in [place] office in 4-6 weeks. I have advised that he not drive in the interim.  If he has syncope, he is instructed to call 911.  2. Afib Compliance with eliquis is advised  3. HTN Stable No change required today   Risks, benefits and potential toxicities for medications prescribed and/or refilled reviewed with patient today.    Follow-up:  Dr Lovena Le in 4-6 weeks  Signed, Thompson Grayer, MD  07/18/2022 10:24 AM     [facility] [address] [place] [place] [address] [phone] (office) [phone] (fax)

## 2022-07-19 DIAGNOSIS — N189 Chronic kidney disease, unspecified: Secondary | ICD-10-CM | POA: Diagnosis not present

## 2022-07-19 DIAGNOSIS — N184 Chronic kidney disease, stage 4 (severe): Secondary | ICD-10-CM | POA: Diagnosis not present

## 2022-07-19 DIAGNOSIS — I129 Hypertensive chronic kidney disease with stage 1 through stage 4 chronic kidney disease, or unspecified chronic kidney disease: Secondary | ICD-10-CM | POA: Diagnosis not present

## 2022-07-19 DIAGNOSIS — R809 Proteinuria, unspecified: Secondary | ICD-10-CM | POA: Diagnosis not present

## 2022-07-19 DIAGNOSIS — D631 Anemia in chronic kidney disease: Secondary | ICD-10-CM | POA: Diagnosis not present

## 2022-07-19 DIAGNOSIS — N170 Acute kidney failure with tubular necrosis: Secondary | ICD-10-CM | POA: Diagnosis not present

## 2022-07-19 DIAGNOSIS — I5032 Chronic diastolic (congestive) heart failure: Secondary | ICD-10-CM | POA: Diagnosis not present

## 2022-07-19 DIAGNOSIS — E211 Secondary hyperparathyroidism, not elsewhere classified: Secondary | ICD-10-CM | POA: Diagnosis not present

## 2022-07-22 ENCOUNTER — Ambulatory Visit (INDEPENDENT_AMBULATORY_CARE_PROVIDER_SITE_OTHER): Payer: Medicare HMO | Admitting: Internal Medicine

## 2022-07-22 ENCOUNTER — Telehealth: Payer: Self-pay | Admitting: Cardiology

## 2022-07-22 ENCOUNTER — Encounter: Payer: Self-pay | Admitting: Internal Medicine

## 2022-07-22 VITALS — BP 136/70 | HR 78 | Resp 18 | Ht 70.0 in | Wt 186.6 lb

## 2022-07-22 DIAGNOSIS — M0600 Rheumatoid arthritis without rheumatoid factor, unspecified site: Secondary | ICD-10-CM | POA: Diagnosis not present

## 2022-07-22 DIAGNOSIS — I480 Paroxysmal atrial fibrillation: Secondary | ICD-10-CM

## 2022-07-22 DIAGNOSIS — E1122 Type 2 diabetes mellitus with diabetic chronic kidney disease: Secondary | ICD-10-CM

## 2022-07-22 DIAGNOSIS — I5032 Chronic diastolic (congestive) heart failure: Secondary | ICD-10-CM

## 2022-07-22 DIAGNOSIS — I10 Essential (primary) hypertension: Secondary | ICD-10-CM

## 2022-07-22 DIAGNOSIS — L239 Allergic contact dermatitis, unspecified cause: Secondary | ICD-10-CM | POA: Diagnosis not present

## 2022-07-22 DIAGNOSIS — N184 Chronic kidney disease, stage 4 (severe): Secondary | ICD-10-CM | POA: Diagnosis not present

## 2022-07-22 LAB — POCT GLYCOSYLATED HEMOGLOBIN (HGB A1C)
HbA1c, POC (controlled diabetic range): 5.5 % (ref 0.0–7.0)
Hemoglobin A1C: 5.5 % (ref 4.0–5.6)

## 2022-07-22 NOTE — Assessment & Plan Note (Addendum)
On Dupixent and as needed Kenalog cream Followed by Dr. Denna Haggard Referred to [facility] dermatology as his current dermatology office is closing

## 2022-07-22 NOTE — Assessment & Plan Note (Signed)
On Arava Followed by Rheumatology 

## 2022-07-22 NOTE — Telephone Encounter (Signed)
Wife calling in to see if they can come to the office tomorrow, to get the patient monitor put on. Please Advise

## 2022-07-22 NOTE — Assessment & Plan Note (Signed)
Appears euvolemic currently On Farxiga Followed by cardiology 

## 2022-07-22 NOTE — Assessment & Plan Note (Signed)
Lab Results  Component Value Date   HGBA1C 5.5 07/22/2022   HGBA1C 5.5 07/22/2022   Diet controlled Advised to follow diabetic diet On statin and ARB F/u CMP and lipid panel Diabetic eye exam: Advised to follow up with Ophthalmology for diabetic eye exam 

## 2022-07-22 NOTE — Progress Notes (Signed)
Established Patient Office Visit  Subjective:  Patient ID: Jacob Farmer, male    DOB: May 23, 1946  Age: 76 y.o. MRN: [id number]  CC:  Chief Complaint  Patient presents with   Follow-up    6 month follow up HTN and CKD     HPI Jacob Farmer is a 76 y.o. male with past medical history of CAD status post stent placement, HTN, HLD, RA, CKD stage IV, prostate ca. s/p radiotherapy, gout, cervical spinal stenosis and GERD who presents for f/u of his chronic medical conditions.  CAD s/p stent placement and HTN: BP is well-controlled. Takes medications regularly. Patient denies headache, chest pain, dyspnea or palpitations.  He follows up with cardiology.  He is on aspirin and statin.  He takes Eliquis for history of A-fib.  Denies any signs of bleeding currently.  He has been having episodes of dizziness, and was referred to EP cardiology.  He is going to have loop recorder for evaluation of bradycardia.   RA: He is on [place] for it. He goes to rheumatology clinic.  He also takes allopurinol for history of gout, denies any recent flareups.   CKD D stage IV: He is on losartan.  He has been placed on Farxiga now. He follows up with nephrology for it.  Denies any dysuria or hematuria currently.  He has a history of allergic dermatitis, for which he takes [place] and sees Dr. Denna Haggard.     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 Toni Amend   Sinus bradycardia    Wears dentures     Past Surgical History:  Procedure Laterality Date   BIOPSY  08/27/2019   Procedure: BIOPSY;  Surgeon: Rogene Houston, MD;  [facility];  Service: Endoscopy;;  gastric   BIOPSY  04/03/2022   Procedure: BIOPSY;  Surgeon: Montez Morita, Quillian Quince, MD;  [facility];  Service: Gastroenterology;;   CARDIOVASCULAR STRESS TEST  2001  per Dr Lattie Haw clinic note   distal anteroseptal ischemia,  normal LVF   COLONOSCOPY  last one 2006 (approx)   COLONOSCOPY WITH PROPOFOL N/A 04/03/2022   Procedure: COLONOSCOPY WITH PROPOFOL;  Surgeon: Harvel Quale, MD;  [facility];  Service: Gastroenterology;  Laterality: N/A;  [place]   DES x2  to CFX and RCA/  residual total occlusion LAD,  normal LVEF   ESOPHAGOGASTRODUODENOSCOPY (EGD) WITH PROPOFOL N/A 08/27/2019   Procedure: ESOPHAGOGASTRODUODENOSCOPY (EGD) WITH PROPOFOL;  Surgeon: Rogene Houston, MD;  [facility];  Service: Endoscopy;  Laterality: N/A;  10:10  ESOPHAGOGASTRODUODENOSCOPY (EGD) WITH PROPOFOL N/A 04/03/2022   Procedure: ESOPHAGOGASTRODUODENOSCOPY (EGD) WITH PROPOFOL;  Surgeon: Harvel Quale, MD;  [facility];  Service: Gastroenterology;  Laterality: N/A;   EYE SURGERY Bilateral    cataract   HOT HEMOSTASIS  04/03/2022   Procedure: HOT HEMOSTASIS (ARGON PLASMA COAGULATION/BICAP);  Surgeon: Montez Morita, Quillian Quince, MD;  [facility];  Service: Gastroenterology;;   POLYPECTOMY  04/03/2022   Procedure: POLYPECTOMY;  Surgeon: Harvel Quale, MD;  [facility];  Service: Gastroenterology;;   POSTERIOR CERVICAL FUSION/FORAMINOTOMY N/A  05/26/2017   Procedure: RIGHT C4-5 FORAMINOTOMY WITH EXCISION OF HERNIATED [place];  Surgeon: Jessy Oto, MD;  Location: [facility];  Service: Orthopedics;  Laterality: N/A;   RADIOACTIVE SEED IMPLANT N/A 01/11/2016   Procedure: RADIOACTIVE SEED IMPLANT/BRACHYTHERAPY IMPLANT;  Surgeon: Franchot Gallo, MD;  Location: [facility];  Service: Urology;  Laterality: N/A;   50  seeds implanted no seeds founds in bladder    Family History  Problem Relation Age of Onset   Heart attack Mother    Coronary artery disease Father    Ulcerative colitis Father    Ulcers Father    Breast cancer Sister    COPD Brother    Pancreatic cancer Brother    Healthy Sister    Diabetes Brother    Cirrhosis Son    Seizures Son     Social History   Socioeconomic History   Marital status: Married    Spouse name: Not on file   Number of children: 2   Years of education: Not on file   Highest education level: Not on file  Occupational History   Occupation: Retired Clinical biochemist  Tobacco Use   Smoking status: Former    Packs/day: 1.00    Years: 30.00    Total pack years: 30.00    Types: Cigarettes    Quit date: 05/21/1989    Years since quitting: 33.1    Passive exposure: Never   Smokeless tobacco: Never  Vaping Use   Vaping Use: Never used  Substance and Sexual Activity   Alcohol use: Yes    Comment: 1 beer and 1 mix drink per day   Drug use: No   Sexual activity: Not on file  Other Topics Concern   Not on file  Social History Narrative   Married for 24 years.Retired Clinical biochemist.   Social Determinants of Health   Financial Resource Strain: Low Risk  (09/14/2021)   Overall Financial Resource Strain (CARDIA)    Difficulty of Paying Living Expenses: Not hard at all  Food Insecurity: No Food Insecurity (09/14/2021)   Hunger Vital Sign    Worried About Running Out of Food in the Last Year: Never true    Ran Out of Food in the Last Year: Never true  Transportation Needs: No  Transportation Needs (09/14/2021)   PRAPARE - Hydrologist (Medical): No    Lack of Transportation (Non-Medical): No  Physical Activity: Sufficiently Active (09/14/2021)   Exercise Vital Sign    Days of Exercise per Week: 7 days    Minutes of Exercise per Session: 60 min  Stress: No Stress Concern Present (09/14/2021)   [place]    Feeling of Stress : Not at all  Social Connections: Moderately Integrated (09/14/2021)   Social Connection and Isolation Panel [NHANES]    Frequency of Communication with Friends and Family: More than three times a week  Frequency of Social Gatherings with Friends and Family: More than three times a week    Attends Religious Services: More than 4 times per year    Active Member of [employer] or Organizations: No    Attends Archivist Meetings: Never    Marital Status: Married  Human resources officer Violence: Not At Risk (09/14/2021)   Humiliation, Afraid, Rape, and Kick questionnaire    Fear of Current or Ex-Partner: No    Emotionally Abused: No    Physically Abused: No    Sexually Abused: No    Outpatient Medications Prior to Visit  Medication Sig Dispense Refill   Alcohol Swabs (DROPSAFE ALCOHOL PREP) 70 % PADS USE AS NEEDED TO CLEAN SKIN PRIOR TO FINGERPRICK AND INJECTION 100 each 5   allopurinol (ZYLOPRIM) 100 MG tablet TAKE 1/2 TABLET EVERY OTHER DAY 45 tablet 0   amLODipine (NORVASC) 5 MG tablet Take 1 tablet (5 mg total) by mouth daily. 90 tablet 3   apixaban (ELIQUIS) 5 MG TABS tablet Take 1 tablet (5 mg total) by mouth 2 (two) times daily. 180 tablet 3   Blood Glucose Monitoring Suppl (ACCU-CHEK GUIDE) w/Device KIT 1 Units by Does not apply route 2 (two) times daily. 1 kit 3   Colchicine 0.6 MG CAPS Take 0.6 mg by mouth daily as needed (Gout).     dapagliflozin propanediol (FARXIGA) 5 MG TABS tablet Take by mouth daily.     Dupilumab (DUPIXENT) 300  MG/2ML SOPN Inject 1 application into the skin every 14 (fourteen) days. 300 mL 4   ferrous sulfate 325 (65 FE) MG EC tablet Take 1 tablet by mouth every other day.     folic acid (FOLVITE) 144 MCG tablet Take 400 mcg by mouth daily.     glucose blood (ACCU-CHEK GUIDE) test strip Use as instructed 100 each 12   hydrALAZINE (APRESOLINE) 25 MG tablet Take 50 mg by mouth in the morning and at bedtime.     Lancets (ACCU-CHEK SOFT TOUCH) lancets Use as instructed 100 each 12   leflunomide (ARAVA) 10 MG tablet TAKE 1 TABLET EVERY DAY 90 tablet 0   losartan (COZAAR) 50 MG tablet Take 50 mg by mouth daily.     meclizine (ANTIVERT) 25 MG tablet TAKE 1 TABLET THREE TIMES DAILY AS NEEDED FOR  DIZZINESS 30 tablet 0   nitroGLYCERIN (NITROSTAT) 0.4 MG SL tablet Place 0.4 mg under the tongue every 5 (five) minutes as needed for chest pain.      omeprazole (PRILOSEC) 40 MG capsule Take 1 capsule (40 mg total) by mouth daily. 90 capsule 3   rosuvastatin (CRESTOR) 40 MG tablet TAKE 1 TABLET EVERY DAY . STOP ATORVASTATIN 90 tablet 3   triamcinolone cream (KENALOG) 0.1 % APPLY 1 APPLICATION TOPICALLY DAILY AS NEEDED (RASH). (Patient taking differently: Apply 1 application  topically daily.) 454 g 3   No facility-administered medications prior to visit.    No Known Allergies  ROS Review of Systems  Constitutional:  Negative for chills and fever.  HENT:  Negative for congestion and sore throat.   Eyes:  Negative for pain and discharge.  Respiratory:  Negative for cough and shortness of breath.   Cardiovascular:  Negative for chest pain and palpitations.  Gastrointestinal:  Negative for constipation, diarrhea, nausea and vomiting.  Endocrine: Negative for polydipsia and polyuria.  Genitourinary:  Negative for dysuria and hematuria.  Musculoskeletal:  Positive for arthralgias and back pain. Negative for neck pain and neck stiffness.  Skin:  Positive  for rash.  Neurological:  Positive for dizziness. Negative  for weakness, numbness and headaches.  Psychiatric/Behavioral:  Negative for agitation and behavioral problems.       Objective:    Physical Exam Vitals reviewed.  Constitutional:      General: He is not in acute distress.    Appearance: He is not diaphoretic.  HENT:     Head: Normocephalic and atraumatic.     Nose: Nose normal.     Mouth/Throat:     Mouth: Mucous membranes are moist.  Eyes:     General: No scleral icterus.    Extraocular Movements: Extraocular movements intact.  Cardiovascular:     Rate and Rhythm: Normal rate and regular rhythm.     Pulses: Normal pulses.     Heart sounds: Normal heart sounds. No murmur heard. Pulmonary:     Breath sounds: Normal breath sounds. No wheezing or rales.  Musculoskeletal:     Cervical back: Neck supple. No tenderness.     Right lower leg: No edema.     Left lower leg: No edema.  Skin:    General: Skin is warm.     Findings: Rash (Diffuse eczematous and dry, scaly patches over LE) present.  Neurological:     General: No focal deficit present.     Mental Status: He is alert and oriented to person, place, and time.     Cranial Nerves: No cranial nerve deficit.     Sensory: No sensory deficit.     Motor: No weakness.  Psychiatric:        Mood and Affect: Mood normal.        Behavior: Behavior normal.     BP 136/70 (BP Location: Right Arm, Patient Position: Sitting, Cuff Size: Normal)   Pulse 78   Resp 18   Ht $R'5\' 10"'gi$  (1.778 m)   Wt 186 lb 9.6 oz (84.6 kg)   SpO2 98%   BMI 26.77 kg/m  Wt Readings from Last 3 Encounters:  07/22/22 186 lb 9.6 oz (84.6 kg)  07/18/22 185 lb (83.9 kg)  07/17/22 188 lb (85.3 kg)    Lab Results  Component Value Date   TSH 2.52 08/08/2020   Lab Results  Component Value Date   WBC 5.4 05/09/2022   HGB 12.4 (L) 05/09/2022   HCT 38.6 05/09/2022   MCV 91.7 05/09/2022   PLT 139 (L) 05/09/2022   Lab Results  Component Value Date   NA 138 05/09/2022   K 4.5 05/09/2022   CO2 20  05/09/2022   GLUCOSE 176 (H) 05/09/2022   BUN 33 (H) 05/09/2022   CREATININE 2.76 (H) 05/09/2022   BILITOT 0.4 05/09/2022   ALKPHOS 254 (H) 11/06/2020   AST 14 05/09/2022   ALT 12 05/09/2022   PROT 6.5 05/09/2022   ALBUMIN 2.9 (L) 11/06/2020   CALCIUM 8.7 05/09/2022   ANIONGAP 8 11/02/2020   EGFR 23 (L) 05/09/2022   Lab Results  Component Value Date   CHOL 113 06/14/2021   Lab Results  Component Value Date   HDL 49 06/14/2021   Lab Results  Component Value Date   LDLCALC 48 06/14/2021   Lab Results  Component Value Date   TRIG 82 06/14/2021   Lab Results  Component Value Date   CHOLHDL 2.3 06/14/2021   Lab Results  Component Value Date   HGBA1C 5.5 07/22/2022   HGBA1C 5.5 07/22/2022      Assessment & Plan:   Problem List Items Addressed This Visit  Cardiovascular and Mediastinum   Essential hypertension - Primary    BP Readings from Last 1 Encounters:  07/22/22 136/70  Well-controlled with Amlodipine, Losartan and hydralazine Counseled for compliance with the medications Advised DASH diet and moderate exercise/walking as tolerated      Paroxysmal atrial fibrillation (HCC)    In sinus rhythm currently, rate controlled On Eliquis now Has episodes of dizziness, going to have loop recorder for evaluation of symptomatic bradycardia       Chronic heart failure with preserved ejection fraction ([place])    Appears euvolemic currently On Farxiga Followed by cardiology        Endocrine   Type 2 diabetes mellitus with diabetic chronic kidney disease ([place])    Lab Results  Component Value Date   HGBA1C 5.5 07/22/2022   HGBA1C 5.5 07/22/2022  Diet controlled Advised to follow diabetic diet On statin and ARB F/u CMP and lipid panel Diabetic eye exam: Advised to follow up with Ophthalmology for diabetic eye exam      Relevant Orders   POCT glycosylated hemoglobin (Hb A1C) (Completed)     Musculoskeletal and Integument   Rheumatoid arthritis  ([place])    On Arava Followed by Rheumatology      Allergic dermatitis    On Dupixent and as needed Kenalog cream Followed by Dr. Denna Haggard Referred to [facility] dermatology as his current dermatology office is closing      Relevant Orders   Ambulatory referral to Dermatology     Genitourinary   Chronic kidney disease, stage 4 (severe) ([place])    On losartan and Farxiga Avoid nephrotoxic agents including NSAIDs Followed by nephrology       No orders of the defined types were placed in this encounter.   Follow-up: Return in about 6 months (around 01/22/2023) for Annual physical.    Lindell Spar, MD

## 2022-07-22 NOTE — Assessment & Plan Note (Signed)
In sinus rhythm currently, rate controlled On Eliquis now Has episodes of dizziness, going to have loop recorder for evaluation of symptomatic bradycardia

## 2022-07-22 NOTE — Assessment & Plan Note (Signed)
On losartan and Farxiga Avoid nephrotoxic agents including NSAIDs Followed by nephrology

## 2022-07-22 NOTE — Patient Instructions (Signed)
Please continue taking medications as prescribed.  Please continue to follow low carb diet and ambulate as tolerated. 

## 2022-07-22 NOTE — Telephone Encounter (Signed)
Nurse apt tomorrow 4:15 pm to apply monitor

## 2022-07-22 NOTE — Assessment & Plan Note (Signed)
BP Readings from Last 1 Encounters:  07/22/22 136/70   Well-controlled with Amlodipine, Losartan and hydralazine Counseled for compliance with the medications Advised DASH diet and moderate exercise/walking as tolerated

## 2022-07-23 ENCOUNTER — Telehealth: Payer: Self-pay | Admitting: Physical Medicine and Rehabilitation

## 2022-07-23 ENCOUNTER — Ambulatory Visit: Payer: Medicare HMO | Attending: Internal Medicine

## 2022-07-23 ENCOUNTER — Ambulatory Visit: Payer: Medicare HMO

## 2022-07-23 DIAGNOSIS — I495 Sick sinus syndrome: Secondary | ICD-10-CM | POA: Diagnosis not present

## 2022-07-23 DIAGNOSIS — R42 Dizziness and giddiness: Secondary | ICD-10-CM

## 2022-07-23 DIAGNOSIS — I480 Paroxysmal atrial fibrillation: Secondary | ICD-10-CM

## 2022-07-23 NOTE — Telephone Encounter (Signed)
Pt returned call to [facility] to set referral appt. Please call pt at [phone]

## 2022-07-25 DIAGNOSIS — N184 Chronic kidney disease, stage 4 (severe): Secondary | ICD-10-CM | POA: Diagnosis not present

## 2022-07-25 DIAGNOSIS — I5032 Chronic diastolic (congestive) heart failure: Secondary | ICD-10-CM | POA: Diagnosis not present

## 2022-07-25 DIAGNOSIS — N189 Chronic kidney disease, unspecified: Secondary | ICD-10-CM | POA: Diagnosis not present

## 2022-07-25 DIAGNOSIS — D631 Anemia in chronic kidney disease: Secondary | ICD-10-CM | POA: Diagnosis not present

## 2022-07-25 DIAGNOSIS — E211 Secondary hyperparathyroidism, not elsewhere classified: Secondary | ICD-10-CM | POA: Diagnosis not present

## 2022-07-25 DIAGNOSIS — I129 Hypertensive chronic kidney disease with stage 1 through stage 4 chronic kidney disease, or unspecified chronic kidney disease: Secondary | ICD-10-CM | POA: Diagnosis not present

## 2022-07-25 DIAGNOSIS — R809 Proteinuria, unspecified: Secondary | ICD-10-CM | POA: Diagnosis not present

## 2022-07-26 ENCOUNTER — Telehealth: Payer: Self-pay | Admitting: *Deleted

## 2022-07-26 ENCOUNTER — Telehealth: Payer: Self-pay | Admitting: Cardiology

## 2022-07-26 NOTE — Telephone Encounter (Signed)
Per recent office visits and medication list, pt takes Eliquis '5mg'$  BID. Called pt to confirm current medications.   Marty in [place] and spoke with pharmacist who apologized for the confusion. He was not sure why that fax was sent and he also confirmed where pt is currently on Eliquis '5mg'$  BID.   Please disregard this fax received from [facility].

## 2022-07-26 NOTE — Telephone Encounter (Signed)
Patient's wife calling to check on status of their call earlier today. Please see below.

## 2022-07-26 NOTE — Telephone Encounter (Signed)
Patient is calling with questions about his heart monitor. He states it is saying poor skin connection and the battery life is in the 20's. He wants to know if it will last til when he is to take it off and if he should be charging it. Please advise.

## 2022-07-26 NOTE — Telephone Encounter (Signed)
Received fax from [facility] - requesting to use different manufacturer on Warfarin '5mg'$  daily as directed per coumadin clinic.

## 2022-07-26 NOTE — Telephone Encounter (Signed)
Spoke with patient in regards to monitor - discussed charging and changing unit out as needed.  Suggested that he can call customer service number on box also if need further assistance as they can talk him through trouble shooting step by step.  He verbalized understanding.

## 2022-08-07 ENCOUNTER — Ambulatory Visit: Payer: Self-pay

## 2022-08-07 ENCOUNTER — Encounter: Payer: Self-pay | Admitting: Physical Medicine and Rehabilitation

## 2022-08-07 ENCOUNTER — Ambulatory Visit: Payer: Medicare HMO | Admitting: Physical Medicine and Rehabilitation

## 2022-08-07 VITALS — BP 128/75 | HR 76

## 2022-08-07 DIAGNOSIS — M47816 Spondylosis without myelopathy or radiculopathy, lumbar region: Secondary | ICD-10-CM

## 2022-08-07 MED ORDER — METHYLPREDNISOLONE ACETATE 80 MG/ML IJ SUSP
40.0000 mg | Freq: Once | INTRAMUSCULAR | Status: AC
  Administered 2022-08-07: 40 mg

## 2022-08-07 NOTE — Patient Instructions (Signed)

## 2022-08-07 NOTE — Progress Notes (Signed)
Pt state lower back pain. Pt state walking and standing makes the pain worse. Pt state his knees lock up at time. Pt state he doesn't take or do anything for the pain.  Numeric Pain Rating Scale and Functional Assessment Average Pain 1   In the last MONTH (on 0-10 scale) has pain interfered with the following?  1. General activity like being  able to carry out your everyday physical activities such as walking, climbing stairs, carrying groceries, or moving a chair?  Rating(9)   +Driver, -BT, -Dye Allergies.

## 2022-08-15 NOTE — Progress Notes (Signed)
Jacob Farmer - 76 y.o. male MRN [id number]  Date of birth: 17-Jun-1946  Office Visit Note: Visit Date: 08/07/2022 PCP: Lindell Spar, MD Referred by: Lindell Spar, MD  Subjective: Chief Complaint  Patient presents with   Lower Back - Pain   HPI:  Jacob Farmer is a 76 y.o. male who comes in today at the request of Barnet Pall, FNP for planned Bilateral  L4-5 Lumbar facet/medial branch block with fluoroscopic guidance.  The patient has failed conservative care including home exercise, medications, time and activity modification.  This injection will be diagnostic and hopefully therapeutic.  Please see requesting physician notes for further details and justification.  Exam has shown concordant pain with facet joint loading.   ROS Otherwise per HPI.  Assessment & Plan: Visit Diagnoses:    ICD-10-CM   1. Spondylosis without myelopathy or radiculopathy, lumbar region  M47.816 XR C-ARM NO REPORT    Facet Injection    methylPREDNISolone acetate (DEPO-MEDROL) injection 40 mg      Plan: No additional findings.   Meds & Orders:  Meds ordered this encounter  Medications   methylPREDNISolone acetate (DEPO-MEDROL) injection 40 mg    Orders Placed This Encounter  Procedures   Facet Injection   XR C-ARM NO REPORT    Follow-up: Return for visit to requesting provider as needed.   Procedures: No procedures performed      Clinical History: MRI lumbar spine:   INDICATION: Lumbar radiculopathy.   TECHNIQUE: Sagittal and axial T1 and T2-weighted sequences were performed. Additional sagittal STIR images were performed.   COMPARISON: None available at time of dictation.   FINDINGS:  There are 5 nonrib-bearing lumbar-type vertebrae. The conus medullaris terminates at a normal level. Grade 1 retrolisthesis of L2 on L3 and of L5 on S1. Severe L2-L3 intervertebral disc height loss with moderate height loss at L5-S1. The vertebral body heights are maintained. Edematous endplate  signal changes at L2-L3 and less so at L3-4, nonspecific but likely degenerative in etiology. Colonic diverticulosis.   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   L1-2: No significant spinal canal or neural foraminal stenosis.  L2-3: Disc bulge contributes to moderate spinal canal stenosis. Disc bulge and facet arthrosis contribute to mild/moderate left neural foraminal stenosis.  L3-4: Disc bulge contributes to mild/moderate spinal canal stenosis and along with facet arthrosis contributes to mild left and moderate right neural foraminal stenosis. Disc bulge. No significant spinal canal stenosis. Mild/moderate left and mild right neural foraminal stenosis. Facet arthrosis.  L4-5: Disc bulge. No significant spinal canal stenosis. Posterior marginal osteophytosis and facet arthrosis contribute to severe bilateral neural foraminal stenosis.  L5-S1: No significant spinal canal or neural foraminal stenosis.    IMPRESSION:  Degenerative changes of the lumbar spine most prominent at L5-S1.   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 A CTA would better assess this.   Electronically Signed by: Linden Dolin on 01/25/2022 10:35 AM     Objective:  VS:  HT:    WT:   BMI:     BP:128/75  HR:76bpm  TEMP: ( )  RESP:  Physical Exam Vitals and nursing note reviewed.  Constitutional:      General: He is not in acute distress.    Appearance: Normal appearance. He is not ill-appearing.  HENT:     Head: Normocephalic and atraumatic.     Right Ear: External ear normal.  Left Ear: External ear normal.     Nose: No congestion.  Eyes:     Extraocular Movements: Extraocular movements intact.  Cardiovascular:     Rate and Rhythm: Normal rate.     Pulses: Normal pulses.  Pulmonary:     Effort: Pulmonary effort is normal. No respiratory distress.  Abdominal:     General: There is no  distension.     Palpations: Abdomen is soft.  Musculoskeletal:        General: No tenderness or signs of injury.     Cervical back: Neck supple.     Right lower leg: No edema.     Left lower leg: No edema.     Comments: Patient has good distal strength without clonus.  Skin:    Findings: No erythema or rash.  Neurological:     General: No focal deficit present.     Mental Status: He is alert and oriented to person, place, and time.     Sensory: No sensory deficit.     Motor: No weakness or abnormal muscle tone.     Coordination: Coordination normal.  Psychiatric:        Mood and Affect: Mood normal.        Behavior: Behavior normal.      Imaging: No results found.

## 2022-08-21 ENCOUNTER — Telehealth: Payer: Self-pay | Admitting: Physical Medicine and Rehabilitation

## 2022-08-21 ENCOUNTER — Other Ambulatory Visit: Payer: Self-pay | Admitting: Physical Medicine and Rehabilitation

## 2022-08-21 DIAGNOSIS — G8929 Other chronic pain: Secondary | ICD-10-CM

## 2022-08-21 DIAGNOSIS — M47816 Spondylosis without myelopathy or radiculopathy, lumbar region: Secondary | ICD-10-CM

## 2022-08-21 NOTE — Telephone Encounter (Signed)
Pt called and staets the injection on 08/07/2022 only worked about 25% and his back pain is a 8/10 right now. He wants to know what else can he do?   Cb [phone]

## 2022-08-21 NOTE — Progress Notes (Signed)
Spoke with patient regarding recent facet joint injections, he reports greater than 70% relief with this procedure, we will move forward with second set of diagnostic medial branch blocks. I will place order.

## 2022-08-21 NOTE — Telephone Encounter (Signed)
Advised pt that we will call when we get approval.   Cb [phone]

## 2022-08-26 ENCOUNTER — Telehealth: Payer: Self-pay | Admitting: Rheumatology

## 2022-08-26 DIAGNOSIS — Z79899 Other long term (current) drug therapy: Secondary | ICD-10-CM

## 2022-08-26 NOTE — Telephone Encounter (Signed)
Spoke with [name] and advised [name] will need lab work prior to any prescription refills. She verbalized understanding. Lab orders released and she will have [name] go Wednesday to update.

## 2022-08-26 NOTE — Telephone Encounter (Signed)
Patient's wife [name] called stating the [facility] foundation closes for the year on 09/20/22 which picks up the payment for Awad's Leflunomide medication.  [name] is requesting a 90 day supply be sent to [facility] at the end of September so [facility] can receive it in time.  [name] understands that he is not due for a refill until the end of October, but is hoping the refill could be sent so Raciel would have enough medication til the end of the year.

## 2022-08-28 DIAGNOSIS — Z79899 Other long term (current) drug therapy: Secondary | ICD-10-CM | POA: Diagnosis not present

## 2022-08-29 ENCOUNTER — Other Ambulatory Visit: Payer: Self-pay | Admitting: *Deleted

## 2022-08-29 DIAGNOSIS — M0600 Rheumatoid arthritis without rheumatoid factor, unspecified site: Secondary | ICD-10-CM

## 2022-08-29 LAB — COMPLETE METABOLIC PANEL WITH GFR
AG Ratio: 1.3 (calc) (ref 1.0–2.5)
ALT: 38 U/L (ref 9–46)
AST: 34 U/L (ref 10–35)
Albumin: 3.4 g/dL — ABNORMAL LOW (ref 3.6–5.1)
Alkaline phosphatase (APISO): 94 U/L (ref 35–144)
BUN/Creatinine Ratio: 11 (calc) (ref 6–22)
BUN: 34 mg/dL — ABNORMAL HIGH (ref 7–25)
CO2: 22 mmol/L (ref 20–32)
Calcium: 8 mg/dL — ABNORMAL LOW (ref 8.6–10.3)
Chloride: 109 mmol/L (ref 98–110)
Creat: 3.08 mg/dL — ABNORMAL HIGH (ref 0.70–1.28)
Globulin: 2.7 g/dL (calc) (ref 1.9–3.7)
Glucose, Bld: 86 mg/dL (ref 65–99)
Potassium: 5.3 mmol/L (ref 3.5–5.3)
Sodium: 141 mmol/L (ref 135–146)
Total Bilirubin: 0.3 mg/dL (ref 0.2–1.2)
Total Protein: 6.1 g/dL (ref 6.1–8.1)
eGFR: 20 mL/min/{1.73_m2} — ABNORMAL LOW (ref >=60)

## 2022-08-29 LAB — CBC WITH DIFFERENTIAL/PLATELET
Absolute Monocytes: 738 cells/uL (ref 200–950)
Basophils Absolute: 31 cells/uL (ref 0–200)
Basophils Relative: 0.5 %
Eosinophils Absolute: 186 cells/uL (ref 15–500)
Eosinophils Relative: 3 %
HCT: 34.6 % — ABNORMAL LOW (ref 38.5–50.0)
Hemoglobin: 11 g/dL — ABNORMAL LOW (ref 13.2–17.1)
Lymphs Abs: 713 cells/uL — ABNORMAL LOW (ref 850–3900)
MCH: 27.6 pg (ref 27.0–33.0)
MCHC: 31.8 g/dL — ABNORMAL LOW (ref 32.0–36.0)
MCV: 86.9 fL (ref 80.0–100.0)
MPV: 11.1 fL (ref 7.5–12.5)
Monocytes Relative: 11.9 %
Neutro Abs: 4532 cells/uL (ref 1500–7800)
Neutrophils Relative %: 73.1 %
Platelets: 165 10*3/uL (ref 140–400)
RBC: 3.98 10*6/uL — ABNORMAL LOW (ref 4.20–5.80)
RDW: 12.9 % (ref 11.0–15.0)
Total Lymphocyte: 11.5 %
WBC: 6.2 10*3/uL (ref 3.8–10.8)

## 2022-08-29 MED ORDER — LEFLUNOMIDE 10 MG PO TABS: 10.0000 mg | ORAL_TABLET | Freq: Every day | ORAL | 0 refills | Status: DC

## 2022-08-29 NOTE — Progress Notes (Signed)
Hemoglobin is low and stable.  Creatinine is elevated with GFR 20.  Please forward results to his PCP and nephrologist.

## 2022-08-29 NOTE — Telephone Encounter (Signed)
Next Visit: 09/10/2022   Last Visit: 04/10/2022   Last Fill: 07/08/2022   DX: Seronegative rheumatoid arthritis    Current Dose per office note 04/10/2022: Arava 10 mg 1 tablet by mouth daily   Labs: 08/28/2022 Hemoglobin is low and stable.  Creatinine is elevated with GFR 20.    Okay to refill Arava?

## 2022-09-03 ENCOUNTER — Ambulatory Visit: Payer: Medicare HMO | Attending: Internal Medicine | Admitting: Internal Medicine

## 2022-09-03 ENCOUNTER — Encounter: Payer: Self-pay | Admitting: Internal Medicine

## 2022-09-03 VITALS — BP 138/66 | HR 72 | Ht 70.0 in | Wt 182.0 lb

## 2022-09-03 DIAGNOSIS — I495 Sick sinus syndrome: Secondary | ICD-10-CM | POA: Diagnosis not present

## 2022-09-03 DIAGNOSIS — R42 Dizziness and giddiness: Secondary | ICD-10-CM

## 2022-09-03 NOTE — Patient Instructions (Signed)
Medication Instructions:  Your physician recommends that you continue on your current medications as directed. Please refer to the Current Medication list given to you today.  *If you need a refill on your cardiac medications before your next appointment, please call your pharmacy*   Lab Work: NONE   If you have labs (blood work) drawn today and your tests are completely normal, you will receive your results only by: MyChart Message (if you have MyChart) OR A paper copy in the mail If you have any lab test that is abnormal or we need to change your treatment, we will call you to review the results.   Testing/Procedures: NONE    Follow-Up: At [facility], you and your health needs are our priority.  As part of our continuing mission to provide you with exceptional heart care, we have created designated Provider Care Teams.  These Care Teams include your primary Cardiologist (physician) and Advanced Practice Providers (APPs -  Physician Assistants and Nurse Practitioners) who all work together to provide you with the care you need, when you need it.  We recommend signing up for the patient portal called "MyChart".  Sign up information is provided on this After Visit Summary.  MyChart is used to connect with patients for Virtual Visits (Telemedicine).  Patients are able to view lab/test results, encounter notes, upcoming appointments, etc.  Non-urgent messages can be sent to your provider as well.   To learn more about what you can do with MyChart, go to [email or web address].    Your next appointment:   1 year(s)  The format for your next appointment:   In Person  Provider:   Gregg Taylor, MD    Other Instructions Thank you for choosing  [facility]!    Important Information About Sugar       

## 2022-09-03 NOTE — Progress Notes (Signed)
HPI Jacob Farmer returns today for followup of dizziness. He is a pleasant 76 yo man with a h/o PAF, sinus brady, HTN, and DM. He wore a cardiac monitor for several weeks with no significant brady or tachy. He dizziness is improved for unknown reasons. He started taking [place] in January and his dizziness started in early March. He has not changed his bp lowering meds and his pressures have been controlled.  No Known Allergies   Current Outpatient Medications  Medication Sig Dispense Refill   Alcohol Swabs (DROPSAFE ALCOHOL PREP) 70 % PADS USE AS NEEDED TO CLEAN SKIN PRIOR TO FINGERPRICK AND INJECTION 100 each 5   allopurinol (ZYLOPRIM) 100 MG tablet TAKE 1/2 TABLET EVERY OTHER DAY 45 tablet 0   amLODipine (NORVASC) 5 MG tablet Take 1 tablet (5 mg total) by mouth daily. 90 tablet 3   apixaban (ELIQUIS) 5 MG TABS tablet Take 1 tablet (5 mg total) by mouth 2 (two) times daily. 180 tablet 3   Blood Glucose Monitoring Suppl (ACCU-CHEK GUIDE) w/Device KIT 1 Units by Does not apply route 2 (two) times daily. 1 kit 3   Colchicine 0.6 MG CAPS Take 0.6 mg by mouth daily as needed (Gout).     dapagliflozin propanediol (FARXIGA) 5 MG TABS tablet Take by mouth daily.     Dupilumab (DUPIXENT) 300 MG/2ML SOPN Inject 1 application into the skin every 14 (fourteen) days. 333 mL 4   folic acid (FOLVITE) 545 MCG tablet Take 400 mcg by mouth daily.     glucose blood (ACCU-CHEK GUIDE) test strip Use as instructed 100 each 12   hydrALAZINE (APRESOLINE) 25 MG tablet Take 50 mg by mouth in the morning and at bedtime.     Lancets (ACCU-CHEK SOFT TOUCH) lancets Use as instructed 100 each 12   leflunomide (ARAVA) 10 MG tablet Take 1 tablet (10 mg total) by mouth daily. 90 tablet 0   losartan (COZAAR) 50 MG tablet Take 50 mg by mouth daily.     nitroGLYCERIN (NITROSTAT) 0.4 MG SL tablet Place 0.4 mg under the tongue every 5 (five) minutes as needed for chest pain.      omeprazole (PRILOSEC) 40 MG capsule Take  1 capsule (40 mg total) by mouth daily. 90 capsule 3   rosuvastatin (CRESTOR) 40 MG tablet TAKE 1 TABLET EVERY DAY . STOP ATORVASTATIN 90 tablet 3   triamcinolone cream (KENALOG) 0.1 % APPLY 1 APPLICATION TOPICALLY DAILY AS NEEDED (RASH). (Patient taking differently: Apply 1 application  topically daily.) 454 g 3   No current facility-administered medications for this visit.     Past Medical History:  Diagnosis Date   CAD (coronary artery disease)    Acute myocardial infarction treated with TPA in 1990; 1999-stents to circumflex and RCA; residual total occlusion of the left anterior descending; normal ejection fraction; stress nuclear in 2001-distal anteroseptal ischemia; normal LV function   CKD (chronic kidney disease) stage 4, GFR 15-29 ml/min (HCC)    First degree heart block    History of gastric ulcer    History of kidney stones    History of MI (myocardial infarction)    1990-  treated w/ TPA   Hyperlipidemia    Hypertension    Jaundice    OA (osteoarthritis)    Prediabetes    Prostate cancer (HCC)    Stage T1c , Gleason 3+4,  PSA 4.7,  vol 24cc--  scheduled for radiative seed implants   Psoriatic arthritis ([place])  RA (rheumatoid arthritis) ([place])    Dr. Toni Amend   Sinus bradycardia    Wears dentures     ROS:   All systems reviewed and negative except as noted in the HPI.   Past Surgical History:  Procedure Laterality Date   BIOPSY  08/27/2019   Procedure: BIOPSY;  Surgeon: Rogene Houston, MD;  [facility];  Service: Endoscopy;;  gastric   BIOPSY  04/03/2022   Procedure: BIOPSY;  Surgeon: Montez Morita, Quillian Quince, MD;  [facility];  Service: Gastroenterology;;   CARDIOVASCULAR STRESS TEST  2001  per Dr Lattie Haw clinic note   distal anteroseptal ischemia,  normal LVF   COLONOSCOPY  last one 2006 (approx)   COLONOSCOPY WITH PROPOFOL N/A 04/03/2022   Procedure: COLONOSCOPY WITH PROPOFOL;  Surgeon: Harvel Quale, MD;  [facility];  Service: Gastroenterology;  Laterality: N/A;  [place]   DES x2  to CFX and RCA/  residual total occlusion LAD,  normal LVEF   ESOPHAGOGASTRODUODENOSCOPY (EGD) WITH PROPOFOL N/A 08/27/2019   Procedure: ESOPHAGOGASTRODUODENOSCOPY (EGD) WITH PROPOFOL;  Surgeon: Rogene Houston, MD;  [facility];  Service: Endoscopy;  Laterality: N/A;  10:10   ESOPHAGOGASTRODUODENOSCOPY (EGD) WITH PROPOFOL N/A 04/03/2022   Procedure: ESOPHAGOGASTRODUODENOSCOPY (EGD) WITH PROPOFOL;  Surgeon: Harvel Quale, MD;  [facility];  Service: Gastroenterology;  Laterality: N/A;   EYE SURGERY Bilateral    cataract   HOT HEMOSTASIS  04/03/2022   Procedure: HOT HEMOSTASIS (ARGON PLASMA COAGULATION/BICAP);  Surgeon: Montez Morita, Quillian Quince, MD;  [facility];  Service: Gastroenterology;;   POLYPECTOMY  04/03/2022   Procedure: POLYPECTOMY;  Surgeon: Harvel Quale, MD;  [facility];  Service: Gastroenterology;;   POSTERIOR CERVICAL FUSION/FORAMINOTOMY N/A 05/26/2017   Procedure: RIGHT C4-5 FORAMINOTOMY WITH EXCISION OF HERNIATED [place];  Surgeon: Jessy Oto, MD;  Location: [facility];  Service: Orthopedics;  Laterality: N/A;   RADIOACTIVE SEED IMPLANT N/A 01/11/2016   Procedure: RADIOACTIVE SEED IMPLANT/BRACHYTHERAPY IMPLANT;  Surgeon: Franchot Gallo, MD;  Location: [facility];  Service: Urology;  Laterality: N/A;   25  seeds implanted no seeds founds in bladder     Family History  Problem Relation Age of Onset   Heart attack Mother    Coronary artery disease Father    Ulcerative colitis Father    Ulcers Father    Breast cancer Sister    COPD Brother    Pancreatic cancer Brother    Healthy Sister    Diabetes Brother    Cirrhosis Son    Seizures Son      Social History   Socioeconomic History   Marital status: Married    Spouse name: Not on file   Number of children: 2    Years of education: Not on file   Highest education level: Not on file  Occupational History   Occupation: Retired Clinical biochemist  Tobacco Use   Smoking status: Former    Packs/day: 1.00    Years: 30.00    Total pack years: 30.00    Types: Cigarettes    Quit date: 05/21/1989    Years since quitting: 33.3    Passive exposure: Never   Smokeless tobacco: Never  Vaping Use   Vaping Use: Never used  Substance and Sexual Activity   Alcohol use: Yes    Comment: 1 beer and 1 mix drink per day   Drug use: No  Sexual activity: Not on file  Other Topics Concern   Not on file  Social History Narrative   Married for 24 years.Retired Clinical biochemist.   Social Determinants of Health   Financial Resource Strain: Low Risk  (09/14/2021)   Overall Financial Resource Strain (CARDIA)    Difficulty of Paying Living Expenses: Not hard at all  Food Insecurity: No Food Insecurity (09/14/2021)   Hunger Vital Sign    Worried About Running Out of Food in the Last Year: Never true    Ran Out of Food in the Last Year: Never true  Transportation Needs: No Transportation Needs (09/14/2021)   PRAPARE - Hydrologist (Medical): No    Lack of Transportation (Non-Medical): No  Physical Activity: Sufficiently Active (09/14/2021)   Exercise Vital Sign    Days of Exercise per Week: 7 days    Minutes of Exercise per Session: 60 min  Stress: No Stress Concern Present (09/14/2021)   [place]    Feeling of Stress : Not at all  Social Connections: Moderately Integrated (09/14/2021)   Social Connection and Isolation Panel [NHANES]    Frequency of Communication with Friends and Family: More than three times a week    Frequency of Social Gatherings with Friends and Family: More than three times a week    Attends Religious Services: More than 4 times per year    Active Member of [employer] or Organizations: No    Attends Theatre manager Meetings: Never    Marital Status: Married  Human resources officer Violence: Not At Risk (09/14/2021)   Humiliation, Afraid, Rape, and Kick questionnaire    Fear of Current or Ex-Partner: No    Emotionally Abused: No    Physically Abused: No    Sexually Abused: No     BP 138/66   Pulse 72   Ht 5' 10"  (1.778 m)   Wt 182 lb (82.6 kg)   SpO2 98%   BMI 26.11 kg/m   Physical Exam:  Well appearing NAD HEENT: Unremarkable Neck:  No JVD, no thyromegally Lymphatics:  No adenopathy Back:  No CVA tenderness Lungs:  Clear HEART:  Regular rate rhythm, no murmurs, no rubs, no clicks Abd:  soft, positive bowel sounds, no organomegally, no rebound, no guarding Ext:  2 plus pulses, no edema, no cyanosis, no clubbing Skin:  No rashes no nodules Neuro:  CN II through XII intact, motor grossly intact  Assess/Plan:  Dizziness - I wonder about the farxiga. I asked him to hold this med for 2 weeks. He will followup with his primary MD and if the dizziness is better, perhaps he could be started on something else for DM control. HTN - his bp is controlled. No change in meds. Sinus node dysfunction - I have reviewed his recent heart monitor which does not demonstrate significant brady at least no indication for PPM. DM - I encouraged the patient to work on his diet and avoid sweets and carbs.   Carleene Overlie Andrienne Havener,MD

## 2022-09-06 NOTE — Progress Notes (Signed)
Office Visit Note  Patient: Jacob Farmer             Date of Birth: 1946-04-12           MRN: [id number]             PCP: Lindell Spar, MD Referring: Lindell Spar, MD Visit Date: 09/20/2022 Occupation: '@GUAROCC'$ @  Subjective:  Low back pain    History of Present Illness: Jacob Farmer is a 76 y.o. male with history of seronegative rheumatoid arthritis, osteoarthritis, and gout.  Patient remains on Arava 10 mg 1 tablet by mouth daily.  He continues to tolerate Arava without any side effects.  He denies any signs or symptoms of a rheumatoid arthritis flare.  He has not had any recent gout flares.  He is taking allopurinol 50 mg every other day for management of gout.  He takes colchicine only on an as-needed basis.  He has not needed to take colchicine recently.  He reports that he has an upcoming appointment with his nephrologist later this month. He states that he has been under the care of Dr. Ernestina Patches for chronic lower back pain.  He has an upcoming appointment on 10/15/2022 for repeat facet joint injection.  He is hopeful that the facet joint injection will provide relief.  He states that recently he has been experiencing a tingling sensation in both knee joints after sitting for prolonged periods of time.  He states that this sensation typically occurs when he gets up after sitting for prolonged periods of time.  He experiences some stiffness in both knees which typically improves as he is able to walk.  He has not had any recent falls.  He denies any pain in his knee joints currently.  He has not noticed any joint swelling. Patient reports that he was evaluated by Dr. Posey Pronto on 09/13/2022.  He states that for about 6 to 8 weeks he has had an ulcer on the left foot which has been not healing.  He had x-rays of the left foot on 09/13/2022 and also had debridement of the wound.      Activities of Daily Living:  Patient reports morning stiffness for 0 minutes.   Patient Denies nocturnal  pain.  Difficulty dressing/grooming: Denies Difficulty climbing stairs: Denies Difficulty getting out of chair: Denies Difficulty using hands for taps, buttons, cutlery, and/or writing: Denies  Review of Systems  Constitutional:  Negative for fatigue.  HENT:  Negative for mouth sores and mouth dryness.   Eyes:  Negative for dryness.  Respiratory:  Negative for shortness of breath.   Cardiovascular:  Negative for chest pain and palpitations.  Gastrointestinal:  Negative for blood in stool, constipation and diarrhea.  Endocrine: Positive for increased urination.  Genitourinary:  Positive for involuntary urination.  Musculoskeletal:  Positive for joint pain and joint pain. Negative for gait problem, joint swelling, myalgias, muscle weakness, morning stiffness, muscle tenderness and myalgias.  Skin:  Negative for color change, rash, hair loss and sensitivity to sunlight.  Allergic/Immunologic: Negative for susceptible to infections.  Neurological:  Positive for dizziness. Negative for numbness and headaches.  Hematological:  Negative for swollen glands.  Psychiatric/Behavioral:  Negative for depressed mood and sleep disturbance. The patient is not nervous/anxious.     PMFS History:  Patient Active Problem List   Diagnosis Date Noted   Chronic heart failure with preserved ejection fraction ([place]) 07/22/2022   Tachycardia-bradycardia syndrome ([place]) 07/18/2022   Dizziness 04/09/2022   Iron deficiency  anemia 02/28/2022   Encounter for general adult medical examination with abnormal findings 01/25/2022   Paroxysmal atrial fibrillation ([place]) 01/22/2022   Allergic dermatitis 09/12/2021   History of gastric ulcer    Herniated cervical disc 05/26/2017    Class: Acute   Cervical spinal stenosis 05/26/2017   Primary osteoarthritis of both hands 02/04/2017   History of gout 02/04/2017   DDD (degenerative disc disease), lumbar 02/04/2017   High risk medication use 01/28/2017   History of  prostate cancer 01/28/2017   Chronic kidney disease, stage 4 (severe) ([place]) 12/27/2011   Type 2 diabetes mellitus with diabetic chronic kidney disease ([place]) 12/27/2011   Rheumatoid arthritis ([place]) 01/04/2011   SINUS BRADYCARDIA 01/12/2010   Coronary artery disease involving native coronary artery of native heart without angina pectoris 01/12/2010   Hyperlipidemia 01/08/2010   Essential hypertension 01/08/2010    Past Medical History:  Diagnosis Date   CAD (coronary artery disease)    Acute myocardial infarction treated with TPA in 1990; 1999-stents to circumflex and RCA; residual total occlusion of the left anterior descending; normal ejection fraction; stress nuclear in 2001-distal anteroseptal ischemia; normal LV function   CKD (chronic kidney disease) stage 4, GFR 15-29 ml/min (HCC)    First degree heart block    History of gastric ulcer    History of kidney stones    History of MI (myocardial infarction)    1990-  treated w/ TPA   Hyperlipidemia    Hypertension    Jaundice    OA (osteoarthritis)    Prediabetes    Prostate cancer (HCC)    Stage T1c , Gleason 3+4,  PSA 4.7,  vol 24cc--  scheduled for radiative seed implants   Psoriatic arthritis (HCC)    RA (rheumatoid arthritis) ([place])    Dr. Toni Amend   Sinus bradycardia    Wears dentures     Family History  Problem Relation Age of Onset   Heart attack Mother    Coronary artery disease Father    Ulcerative colitis Father    Ulcers Father    Breast cancer Sister    COPD Brother    Pancreatic cancer Brother    Healthy Sister    Diabetes Brother    Cirrhosis Son    Seizures Son    Past Surgical History:  Procedure Laterality Date   BIOPSY  08/27/2019   Procedure: BIOPSY;  Surgeon: Rogene Houston, MD;  [facility];  Service: Endoscopy;;  gastric   BIOPSY  04/03/2022   Procedure: BIOPSY;  Surgeon: Montez Morita, Quillian Quince, MD;  [facility];  Service: Gastroenterology;;   callus removal Left  09/13/2022   left foot   CARDIOVASCULAR STRESS TEST  2001  per Dr Lattie Haw clinic note   distal anteroseptal ischemia,  normal LVF   COLONOSCOPY  last one 2006 (approx)   COLONOSCOPY WITH PROPOFOL N/A 04/03/2022   Procedure: COLONOSCOPY WITH PROPOFOL;  Surgeon: Harvel Quale, MD;  [facility];  Service: Gastroenterology;  Laterality: N/A;  [place]   DES x2  to CFX and RCA/  residual total occlusion LAD,  normal LVEF   ESOPHAGOGASTRODUODENOSCOPY (EGD) WITH PROPOFOL N/A 08/27/2019   Procedure: ESOPHAGOGASTRODUODENOSCOPY (EGD) WITH PROPOFOL;  Surgeon: Rogene Houston, MD;  [facility];  Service: Endoscopy;  Laterality: N/A;  10:10   ESOPHAGOGASTRODUODENOSCOPY (EGD) WITH PROPOFOL N/A 04/03/2022   Procedure: ESOPHAGOGASTRODUODENOSCOPY (EGD) WITH PROPOFOL;  Surgeon: Harvel Quale,  MD;  [facility];  Service: Gastroenterology;  Laterality: N/A;   EYE SURGERY Bilateral    cataract   HOT HEMOSTASIS  04/03/2022   Procedure: HOT HEMOSTASIS (ARGON PLASMA COAGULATION/BICAP);  Surgeon: Montez Morita, Quillian Quince, MD;  [facility];  Service: Gastroenterology;;   POLYPECTOMY  04/03/2022   Procedure: POLYPECTOMY;  Surgeon: Harvel Quale, MD;  [facility];  Service: Gastroenterology;;   POSTERIOR CERVICAL FUSION/FORAMINOTOMY N/A 05/26/2017   Procedure: RIGHT C4-5 FORAMINOTOMY WITH EXCISION OF HERNIATED [place];  Surgeon: Jessy Oto, MD;  Location: [facility];  Service: Orthopedics;  Laterality: N/A;   RADIOACTIVE SEED IMPLANT N/A 01/11/2016   Procedure: RADIOACTIVE SEED IMPLANT/BRACHYTHERAPY IMPLANT;  Surgeon: Franchot Gallo, MD;  Location[facility];  Service: Urology;  Laterality: N/A;   73  seeds implanted no seeds founds in bladder   Social History   Social History Narrative   Married for 24 years.Retired Clinical biochemist.   Immunization History   Administered Date(s) Administered   Fluad Quad(high Dose 65+) 09/10/2022   Influenza, High Dose Seasonal PF 09/12/2017, 08/28/2019, 08/28/2019   Influenza-Unspecified 08/08/2020, 08/24/2021   PFIZER(Purple Top)SARS-COV-2 Vaccination 01/22/2020, 02/12/2020, 08/15/2020, 01/19/2021   PNEUMOCOCCAL CONJUGATE-20 01/28/2022   Pneumococcal Polysaccharide-23 08/16/2009   Td 10/26/2021   Tdap 09/28/2021   Zoster Recombinat (Shingrix) 07/29/2019, 09/28/2021     Objective: Vital Signs: BP (!) 158/84 (BP Location: Left Arm, Patient Position: Sitting, Cuff Size: Normal)   Pulse 70   Resp 14   Ht '5\' 10"'$  (1.778 m)   Wt 182 lb 12.8 oz (82.9 kg)   BMI 26.23 kg/m    Physical Exam Vitals and nursing note reviewed.  Constitutional:      Appearance: He is well-developed.  HENT:     Head: Normocephalic and atraumatic.  Eyes:     Conjunctiva/sclera: Conjunctivae normal.     Pupils: Pupils are equal, round, and reactive to light.  Cardiovascular:     Rate and Rhythm: Normal rate and regular rhythm.     Heart sounds: Normal heart sounds.  Pulmonary:     Effort: Pulmonary effort is normal.     Breath sounds: Normal breath sounds.  Abdominal:     General: Bowel sounds are normal.     Palpations: Abdomen is soft.  Musculoskeletal:     Cervical back: Normal range of motion and neck supple.  Skin:    General: Skin is warm and dry.     Capillary Refill: Capillary refill takes less than 2 seconds.  Neurological:     Mental Status: He is alert and oriented to person, place, and time.  Psychiatric:        Behavior: Behavior normal.      Musculoskeletal Exam: C-spine has good range of motion with no discomfort.  No midline spinal tenderness at this time.  Shoulder joints, elbow joints, wrist joints, MCPs, PIPs, DIPs have good range of motion with no synovitis.  PIP and DIP thickening consistent with osteoarthritis of both hands.  Hip joints have good range of motion with no groin pain.  Knee joints  have good range of motion with no warmth or effusion.  Ankle joints have good range of motion with no joint tenderness.  CDAI Exam: CDAI Score: -- Patient Global: --; Provider Global: -- Swollen: --; Tender: -- Joint Exam 09/20/2022   No joint exam has been documented for this visit   There is currently no information documented on the homunculus. Go to the Rheumatology activity and  complete the homunculus joint exam.  Investigation: No additional findings.  Imaging: DG Foot Complete Left  Result Date: 09/13/2022 Please see detailed radiograph report in office note.  XR C-ARM NO REPORT  Result Date: 09/09/2022 Please see Notes tab for imaging impression.  CARDIAC EVENT MONITOR  Result Date: 08/29/2022 NSR with sinus brady and sinus tachy Occaisional PAC's and PVC's No VT or SVT or atrial fib No prolonged pauses Noise artifact is present and limits interpretation Gregg Marijane Trower,MD    Recent Labs: Lab Results  Component Value Date   WBC 6.2 08/28/2022   HGB 11.0 (L) 08/28/2022   PLT 165 08/28/2022   NA 141 08/28/2022   K 5.3 08/28/2022   CL 109 08/28/2022   CO2 22 08/28/2022   GLUCOSE 86 08/28/2022   BUN 34 (H) 08/28/2022   CREATININE 3.08 (H) 08/28/2022   BILITOT 0.3 08/28/2022   ALKPHOS 254 (H) 11/06/2020   AST 34 08/28/2022   ALT 38 08/28/2022   PROT 6.1 08/28/2022   ALBUMIN 2.9 (L) 11/06/2020   CALCIUM 8.0 (L) 08/28/2022   GFRAA 27 (L) 06/14/2021    Speciality Comments: No specialty comments available.  Procedures:  No procedures performed Allergies: Patient has no known allergies.    Assessment / Plan:     Visit Diagnoses: Seronegative rheumatoid arthritis ([place]): He has no joint tenderness or synovitis on examination today.  He has not had any signs or symptoms of a rheumatoid arthritis flare.  He has been taking Arava 10 mg 1 tablet daily.  He is tolerating Arava without any side effects and has not missed any doses recently.  He has been taking the  reduced dose of Arava due to chronic kidney disease.  Creatinine was 3.08 and GFR was 20 on 08/28/2022.  He has an upcoming appointment with his nephrologist for further evaluation.  He has been avoiding the use of all NSAIDs.  Patient currently has a chronic ulcer on the left foot and is under the care of Dr. Posey Pronto.  He was advised to hold arava until the ulcer has completely healed.  He will require clearance prior to restarting Arava.  He was advised to notify us if he develops signs or symptoms of a flare while off of therapy.  He will follow-up in the office in 3 months or sooner if needed.  High risk medication use - Advised to hold-Arava 10 mg 1 tablet by mouth daily until the ulcer on his left foot has completely healed. CBC and CMP were drawn on 08/28/2022.  Results were reviewed with the patient today in the office.  He has an upcoming appointment with his nephrologist later this month.  His next lab work will be due in December and every 3 months to monitor for drug toxicity. Advised the patient to hold arava until the ulcer on his left foot has completely healed. Discussed the importance of holding leflunomide if he develops signs or symptoms of an infection and to resume once the infection has completely cleared.  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  Patient is diabetic and has history of neuropathy.  He was evaluated by Dr. Posey Pronto on 09/13/2022.  He had x-rays of the left foot at that time: No signs of osteomyelitis were noted.  An excisional debridement of the wound was performed and a dry compression dressing was applied.  Recommended dressing with Betadine wet-to-dry and using a surgical shoe  to offload. Upon presentation today the patient was not wearing a surgical shoe. The patient and his wife were planning to try warm Epsom salt baths to promote healing.  I advised the patient and his wife to  reach out to Dr. Posey Pronto prior to soaking his foot since this was not discussed in his wound care plan. I also advised the patient to hold [place] until the ulcer has completely healed.  He will require clearance by Dr. Posey Pronto prior to resuming Arava once the wound has healed. He voiced understanding.    Idiopathic chronic gout of multiple sites without tophus -He is taking allopurinol 50 mg every other day for management of gout.he has a prescription for colchicine which she takes as needed during flares.  He has not needed to take colchicine recently.  Uric acid .was within the desirable range 5.0 on 05/09/2022.  He has an upcoming appointment with his nephrologist later this month.  Primary osteoarthritis of both hands: He has PIP and DIP thickening consistent with osteoarthritis of both hands.  No inflammation noted on examination today.  Discussed the importance of joint protection and muscle strengthening.  DDD (degenerative disc disease), lumbar - Followed by Dr. Louanne Skye.  Currently under the care of Dr. Ernestina Patches.  Scheduled for facet joint injection on 10/15/2022.  History of renal insufficiency syndrome: Followed closely by nephrology.  Upcoming appointment later this month.  Creatinine was 3.08 and GFR was 20 on 08/28/2022.  He has been avoiding all use of NSAIDs.    Osteoporosis screening - DEXA scan from February 08, 2021 was normal.  Stiffness of both knees -Patient has been experiencing a tingling sensation as well as stiffness in both knee joints after sitting for prolonged periods of time and rising from a seated position.  According to the patient he was told by Dr. Ernestina Patches that the symptoms were not coming from his lower back.  He has not been experiencing any discomfort in his knee joints and has not noticed any joint swelling.  On examination he has good range of motion of both knee joints with no warmth or effusion.  He has not been experiencing any increased knee joint pain.  X-rays of both  knee joints were obtained today for further evaluation.  Plan: XR KNEE 3 VIEW LEFT, XR KNEE 3 VIEW RIGHT  Other medical conditions are listed as follows:  History of hypertension  History of hypercholesterolemia  History of coronary artery disease  History of gastroesophageal reflux (GERD)  History of prostate cancer    Orders: Orders Placed This Encounter  Procedures   XR KNEE 3 VIEW LEFT   XR KNEE 3 VIEW RIGHT   No orders of the defined types were placed in this encounter.   Follow-Up Instructions: Return in about 3 months (around 12/21/2022) for Rheumatoid arthritis, Osteoarthritis, Gout.   Ofilia Neas, PA-C  Note - This record has been created using Dragon software.  Chart creation errors have been sought, but may not always  have been located. Such creation errors do not reflect on  the standard of medical care.

## 2022-09-09 ENCOUNTER — Ambulatory Visit: Payer: Self-pay

## 2022-09-09 ENCOUNTER — Ambulatory Visit: Payer: Medicare HMO | Admitting: Physical Medicine and Rehabilitation

## 2022-09-09 DIAGNOSIS — M47816 Spondylosis without myelopathy or radiculopathy, lumbar region: Secondary | ICD-10-CM | POA: Diagnosis not present

## 2022-09-09 MED ORDER — BUPIVACAINE HCL 0.5 % IJ SOLN
3.0000 mL | Freq: Once | INTRAMUSCULAR | Status: AC
  Administered 2022-09-09: 3 mL

## 2022-09-09 NOTE — Progress Notes (Signed)
Jacob Farmer - 76 y.o. male MRN [id number]  Date of birth: [birth year]  Office Visit Note: Visit Date: 09/09/2022 PCP: Lindell Spar, MD Referred by: Lorine Bears, NP  Subjective: Chief Complaint  Patient presents with   Lower Back - Pain   HPI:  Jacob Farmer is a 76 y.o. male who comes in today for planned repeat Bilateral L4-5 Lumbar facet/medial branch block with fluoroscopic guidance.  The patient has failed conservative care including home exercise, medications, time and activity modification.  This injection will be diagnostic and hopefully therapeutic.  Please see requesting physician notes for further details and justification.  Exam shows concordant low back pain with facet joint loading and extension. Patient received more than 80% pain relief from prior injection. This would be the second block in a diagnostic double block paradigm.     Referring:Megan Jimmye Norman, FNP    ROS Otherwise per HPI.  Assessment & Plan: Visit Diagnoses:    ICD-10-CM   1. Spondylosis without myelopathy or radiculopathy, lumbar region  M47.816 XR C-ARM NO REPORT    Facet Injection    bupivacaine (MARCAINE) 0.5 % (with pres) injection 3 mL      Plan: No additional findings.   Meds & Orders:  Meds ordered this encounter  Medications   bupivacaine (MARCAINE) 0.5 % (with pres) injection 3 mL    Orders Placed This Encounter  Procedures   Facet Injection   XR C-ARM NO REPORT    Follow-up: Return for Review Pain Diary.   Procedures: No procedures performed  Lumbar Diagnostic Facet Joint Nerve Block with Fluoroscopic Guidance   Patient: Jacob Farmer      Date of Birth: [birth year] MRN: [id number] PCP: Lindell Spar, MD      Visit Date: 09/09/2022   Universal Protocol:    Date/Time: 09/25/237:56 PM  Consent Given By: the patient  Position: PRONE  Additional Comments: Vital signs were monitored before and after the procedure. Patient was prepped and draped in the  usual sterile fashion. The correct patient, procedure, and site was verified.   Injection Procedure Details:   Procedure diagnoses:  1. Spondylosis without myelopathy or radiculopathy, lumbar region      Meds Administered:  Meds ordered this encounter  Medications   bupivacaine (MARCAINE) 0.5 % (with pres) injection 3 mL     Laterality: Bilateral  Location/Site: L4-L5, L3 and L4 medial branches  Needle: 5.0 in., 25 ga.  Short bevel or Quincke spinal needle  Needle Placement: Oblique pedical  Findings:   -Comments: There was excellent flow of contrast along the articular pillars without intravascular flow.  Procedure Details: The fluoroscope beam is vertically oriented in AP and then obliqued 15 to 20 degrees to the ipsilateral side of the desired nerve to achieve the "Scotty dog" appearance.  The skin over the target area of the junction of the superior articulating process and the transverse process (sacral ala if blocking the L5 dorsal rami) was locally anesthetized with a 1 ml volume of 1% Lidocaine without Epinephrine.  The spinal needle was inserted and advanced in a trajectory view down to the target.   After contact with periosteum and negative aspirate for blood and CSF, correct placement without intravascular or epidural spread was confirmed by injecting 0.5 ml. of Isovue-250.  A spot radiograph was obtained of this image.    Next, a 0.5 ml. volume of the injectate described above was injected. The needle was then redirected to the other facet  joint nerves mentioned above if needed.  Prior to the procedure, the patient was given a Pain Diary which was completed for baseline measurements.  After the procedure, the patient rated their pain every 30 minutes and will continue rating at this frequency for a total of 5 hours.  The patient has been asked to complete the Diary and return to [place] by mail, fax or hand delivered as soon as possible.   Additional Comments:  The  patient tolerated the procedure well Dressing: 2 x 2 sterile gauze and Band-Aid    Post-procedure details: Patient was observed during the procedure. Post-procedure instructions were reviewed.  Patient left the clinic in stable condition.   Clinical History: MRI lumbar spine:   INDICATION: Lumbar radiculopathy.   TECHNIQUE: Sagittal and axial T1 and T2-weighted sequences were performed. Additional sagittal STIR images were performed.   COMPARISON: None available at time of dictation.   FINDINGS:  There are 5 nonrib-bearing lumbar-type vertebrae. The conus medullaris terminates at a normal level. Grade 1 retrolisthesis of L2 on L3 and of L5 on S1. Severe L2-L3 intervertebral disc height loss with moderate height loss at L5-S1. The vertebral body heights are maintained. Edematous endplate signal changes at L2-L3 and less so at L3-4, nonspecific but likely degenerative in etiology. Colonic diverticulosis.   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   L1-2: No significant spinal canal or neural foraminal stenosis.  L2-3: Disc bulge contributes to moderate spinal canal stenosis. Disc bulge and facet arthrosis contribute to mild/moderate left neural foraminal stenosis.  L3-4: Disc bulge contributes to mild/moderate spinal canal stenosis and along with facet arthrosis contributes to mild left and moderate right neural foraminal stenosis. Disc bulge. No significant spinal canal stenosis. Mild/moderate left and mild right neural foraminal stenosis. Facet arthrosis.  L4-5: Disc bulge. No significant spinal canal stenosis. Posterior marginal osteophytosis and facet arthrosis contribute to severe bilateral neural foraminal stenosis.  L5-S1: No significant spinal canal or neural foraminal stenosis.    IMPRESSION:  Degenerative changes of the lumbar spine most prominent at L5-S1.   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 A CTA would better assess this.   Electronically Signed by: Linden Dolin on 01/25/2022 10:35 AM     Objective:  VS:  HT:    WT:   BMI:     BP:   HR: bpm  TEMP: ( )  RESP:  Physical Exam Vitals and nursing note reviewed.  Constitutional:      General: He is not in acute distress.    Appearance: Normal appearance. He is not ill-appearing.  HENT:     Head: Normocephalic and atraumatic.     Right Ear: External ear normal.     Left Ear: External ear normal.     Nose: No congestion.  Eyes:     Extraocular Movements: Extraocular movements intact.  Cardiovascular:     Rate and Rhythm: Normal rate.     Pulses: Normal pulses.  Pulmonary:     Effort: Pulmonary effort is normal. No respiratory distress.  Abdominal:     General: There is no distension.     Palpations: Abdomen is soft.  Musculoskeletal:        General: No tenderness or signs of injury.     Cervical back: Neck supple.     Right lower leg: No edema.     Left lower leg: No edema.     Comments: Patient has good  distal strength without clonus.  Skin:    Findings: No erythema or rash.  Neurological:     General: No focal deficit present.     Mental Status: He is alert and oriented to person, place, and time.     Sensory: No sensory deficit.     Motor: No weakness or abnormal muscle tone.     Coordination: Coordination normal.  Psychiatric:        Mood and Affect: Mood normal.        Behavior: Behavior normal.      Imaging: XR C-ARM NO REPORT  Result Date: 09/09/2022 Please see Notes tab for imaging impression.

## 2022-09-09 NOTE — Procedures (Signed)
Lumbar Diagnostic Facet Joint Nerve Block with Fluoroscopic Guidance   Patient: Jacob Farmer      Date of Birth: 1946-10-14 MRN: [id number] PCP: Lindell Spar, MD      Visit Date: 09/09/2022   Universal Protocol:    Date/Time: 09/25/237:56 PM  Consent Given By: the patient  Position: PRONE  Additional Comments: Vital signs were monitored before and after the procedure. Patient was prepped and draped in the usual sterile fashion. The correct patient, procedure, and site was verified.   Injection Procedure Details:   Procedure diagnoses:  1. Spondylosis without myelopathy or radiculopathy, lumbar region      Meds Administered:  Meds ordered this encounter  Medications   bupivacaine (MARCAINE) 0.5 % (with pres) injection 3 mL     Laterality: Bilateral  Location/Site: L4-L5, L3 and L4 medial branches  Needle: 5.0 in., 25 ga.  Short bevel or Quincke spinal needle  Needle Placement: Oblique pedical  Findings:   -Comments: There was excellent flow of contrast along the articular pillars without intravascular flow.  Procedure Details: The fluoroscope beam is vertically oriented in AP and then obliqued 15 to 20 degrees to the ipsilateral side of the desired nerve to achieve the "Scotty dog" appearance.  The skin over the target area of the junction of the superior articulating process and the transverse process (sacral ala if blocking the L5 dorsal rami) was locally anesthetized with a 1 ml volume of 1% Lidocaine without Epinephrine.  The spinal needle was inserted and advanced in a trajectory view down to the target.   After contact with periosteum and negative aspirate for blood and CSF, correct placement without intravascular or epidural spread was confirmed by injecting 0.5 ml. of Isovue-250.  A spot radiograph was obtained of this image.    Next, a 0.5 ml. volume of the injectate described above was injected. The needle was then redirected to the other facet joint  nerves mentioned above if needed.  Prior to the procedure, the patient was given a Pain Diary which was completed for baseline measurements.  After the procedure, the patient rated their pain every 30 minutes and will continue rating at this frequency for a total of 5 hours.  The patient has been asked to complete the Diary and return to [place] by mail, fax or hand delivered as soon as possible.   Additional Comments:  The patient tolerated the procedure well Dressing: 2 x 2 sterile gauze and Band-Aid    Post-procedure details: Patient was observed during the procedure. Post-procedure instructions were reviewed.  Patient left the clinic in stable condition.

## 2022-09-09 NOTE — Patient Instructions (Signed)

## 2022-09-09 NOTE — Progress Notes (Signed)
Numeric Pain Rating Scale and Functional Assessment Average Pain 0   In the last MONTH (on 0-10 scale) has pain interfered with the following? When he is sitting down he is fine   1. General activity like being  able to carry out your everyday physical activities such as walking, climbing stairs, carrying groceries, or moving a chair?  Rating(9)   +Driver, +BT eliquis, -Dye Allergies.   114/65

## 2022-09-10 ENCOUNTER — Telehealth: Payer: Self-pay | Admitting: Physical Medicine and Rehabilitation

## 2022-09-10 ENCOUNTER — Encounter: Payer: Self-pay | Admitting: Internal Medicine

## 2022-09-10 ENCOUNTER — Other Ambulatory Visit: Payer: Self-pay | Admitting: Physical Medicine and Rehabilitation

## 2022-09-10 ENCOUNTER — Ambulatory Visit: Payer: Medicare HMO | Admitting: Physician Assistant

## 2022-09-10 ENCOUNTER — Ambulatory Visit (INDEPENDENT_AMBULATORY_CARE_PROVIDER_SITE_OTHER): Payer: Medicare HMO | Admitting: Internal Medicine

## 2022-09-10 VITALS — BP 104/72 | HR 67 | Resp 18 | Ht 70.0 in | Wt 182.4 lb

## 2022-09-10 DIAGNOSIS — L84 Corns and callosities: Secondary | ICD-10-CM | POA: Diagnosis not present

## 2022-09-10 DIAGNOSIS — M47816 Spondylosis without myelopathy or radiculopathy, lumbar region: Secondary | ICD-10-CM

## 2022-09-10 DIAGNOSIS — Z23 Encounter for immunization: Secondary | ICD-10-CM | POA: Diagnosis not present

## 2022-09-10 DIAGNOSIS — E1122 Type 2 diabetes mellitus with diabetic chronic kidney disease: Secondary | ICD-10-CM

## 2022-09-10 DIAGNOSIS — M545 Low back pain, unspecified: Secondary | ICD-10-CM

## 2022-09-10 DIAGNOSIS — N184 Chronic kidney disease, stage 4 (severe): Secondary | ICD-10-CM

## 2022-09-10 NOTE — Telephone Encounter (Signed)
Pt called requesting a call back from Dr Ernestina Patches. Pt states Dr Ernestina Patches instructed him to call and let him know how the injections are working. Please call pt at [phone].

## 2022-09-10 NOTE — Assessment & Plan Note (Addendum)
Lab Results  Component Value Date   HGBA1C 5.5 07/22/2022   HGBA1C 5.5 07/22/2022   Diet controlled Advised to follow diabetic diet On statin and ARB F/u CMP and lipid panel Diabetic eye exam: Advised to follow up with Ophthalmology for diabetic eye exam  Has callus on left foot, referred to podiatry as he is at risk of diabetic foot ulcer

## 2022-09-10 NOTE — Telephone Encounter (Signed)
IC patient. He said he was expecting to talk with Dr Ernestina Patches?  He wanted you to know that he faxed paper earlier this morning. Stated that his back feels stronger and has had some relief of his pain.

## 2022-09-10 NOTE — Progress Notes (Signed)
Acute Office Visit  Subjective:    Patient ID: Jacob Farmer, male    DOB: Jun 08, 1946, 76 y.o.   MRN: [id number]  Chief Complaint  Patient presents with   Foot lesion    Patient has spot on left foot has been there since 08-10-22 and has started bleeding    HPI Patient is in today for complaint of left foot lesion with thick skin, which he noticed about a month ago.  He denies any recent foot injury.  Lesion has been thick, painful upon pressure and has been bleeding lightly.  He has tried applying Callus tape with no relief.  Denies any local pus discharge.  Past Medical History:  Diagnosis Date   CAD (coronary artery disease)    Acute myocardial infarction treated with TPA in 1990; 1999-stents to circumflex and RCA; residual total occlusion of the left anterior descending; normal ejection fraction; stress nuclear in 2001-distal anteroseptal ischemia; normal LV function   CKD (chronic kidney disease) stage 4, GFR 15-29 ml/min (HCC)    First degree heart block    History of gastric ulcer    History of kidney stones    History of MI (myocardial infarction)    1990-  treated w/ TPA   Hyperlipidemia    Hypertension    Jaundice    OA (osteoarthritis)    Prediabetes    Prostate cancer ([place])    Stage T1c , Gleason 3+4,  PSA 4.7,  vol 24cc--  scheduled for radiative seed implants   Psoriatic arthritis ([place])    RA (rheumatoid arthritis) ([place])    Dr. Toni Amend   Sinus bradycardia    Wears dentures     Past Surgical History:  Procedure Laterality Date   BIOPSY  08/27/2019   Procedure: BIOPSY;  Surgeon: Rogene Houston, MD;  [facility];  Service: Endoscopy;;  gastric   BIOPSY  04/03/2022   Procedure: BIOPSY;  Surgeon: Montez Morita, Quillian Quince, MD;  [facility];  Service: Gastroenterology;;   CARDIOVASCULAR STRESS TEST  2001  per Dr Lattie Haw clinic note   distal anteroseptal ischemia,  normal LVF   COLONOSCOPY  last one 2006 (approx)   COLONOSCOPY WITH  PROPOFOL N/A 04/03/2022   Procedure: COLONOSCOPY WITH PROPOFOL;  Surgeon: Harvel Quale, MD;  [facility];  Service: Gastroenterology;  Laterality: N/A;  [place]   DES x2  to CFX and RCA/  residual total occlusion LAD,  normal LVEF   ESOPHAGOGASTRODUODENOSCOPY (EGD) WITH PROPOFOL N/A 08/27/2019   Procedure: ESOPHAGOGASTRODUODENOSCOPY (EGD) WITH PROPOFOL;  Surgeon: Rogene Houston, MD;  [facility];  Service: Endoscopy;  Laterality: N/A;  10:10   ESOPHAGOGASTRODUODENOSCOPY (EGD) WITH PROPOFOL N/A 04/03/2022   Procedure: ESOPHAGOGASTRODUODENOSCOPY (EGD) WITH PROPOFOL;  Surgeon: Harvel Quale, MD;  [facility];  Service: Gastroenterology;  Laterality: N/A;   EYE SURGERY Bilateral    cataract   HOT HEMOSTASIS  04/03/2022   Procedure: HOT HEMOSTASIS (ARGON PLASMA COAGULATION/BICAP);  Surgeon: Montez Morita, Quillian Quince, MD;  [facility];  Service: Gastroenterology;;   POLYPECTOMY  04/03/2022   Procedure: POLYPECTOMY;  Surgeon: Harvel Quale, MD;  [facility];  Service: Gastroenterology;;   POSTERIOR CERVICAL FUSION/FORAMINOTOMY N/A 05/26/2017   Procedure: RIGHT C4-5 FORAMINOTOMY WITH EXCISION OF HERNIATED [place];  Surgeon: Jessy Oto, MD;  Location: [facility];  Service: Orthopedics;  Laterality: N/A;   RADIOACTIVE SEED IMPLANT N/A 01/11/2016  Procedure: RADIOACTIVE SEED IMPLANT/BRACHYTHERAPY IMPLANT;  Surgeon: Franchot Gallo, MD;  Location[facility];  Service: Urology;  Laterality: N/A;   28  seeds implanted no seeds founds in bladder    Family History  Problem Relation Age of Onset   Heart attack Mother    Coronary artery disease Father    Ulcerative colitis Father    Ulcers Father    Breast cancer Sister    COPD Brother    Pancreatic cancer Brother    Healthy Sister    Diabetes Brother    Cirrhosis Son    Seizures Son     Social  History   Socioeconomic History   Marital status: Married    Spouse name: Not on file   Number of children: 2   Years of education: Not on file   Highest education level: Not on file  Occupational History   Occupation: Retired Clinical biochemist  Tobacco Use   Smoking status: Former    Packs/day: 1.00    Years: 30.00    Total pack years: 30.00    Types: Cigarettes    Quit date: 05/21/1989    Years since quitting: 33.3    Passive exposure: Never   Smokeless tobacco: Never  Vaping Use   Vaping Use: Never used  Substance and Sexual Activity   Alcohol use: Yes    Comment: 1 beer and 1 mix drink per day   Drug use: No   Sexual activity: Not on file  Other Topics Concern   Not on file  Social History Narrative   Married for 24 years.Retired Clinical biochemist.   Social Determinants of Health   Financial Resource Strain: Low Risk  (09/14/2021)   Overall Financial Resource Strain (CARDIA)    Difficulty of Paying Living Expenses: Not hard at all  Food Insecurity: No Food Insecurity (09/14/2021)   Hunger Vital Sign    Worried About Running Out of Food in the Last Year: Never true    Ran Out of Food in the Last Year: Never true  Transportation Needs: No Transportation Needs (09/14/2021)   PRAPARE - Hydrologist (Medical): No    Lack of Transportation (Non-Medical): No  Physical Activity: Sufficiently Active (09/14/2021)   Exercise Vital Sign    Days of Exercise per Week: 7 days    Minutes of Exercise per Session: 60 min  Stress: No Stress Concern Present (09/14/2021)   [place]    Feeling of Stress : Not at all  Social Connections: Moderately Integrated (09/14/2021)   Social Connection and Isolation Panel [NHANES]    Frequency of Communication with Friends and Family: More than three times a week    Frequency of Social Gatherings with Friends and Family: More than three times a week    Attends  Religious Services: More than 4 times per year    Active Member of [employer] or Organizations: No    Attends Archivist Meetings: Never    Marital Status: Married  Human resources officer Violence: Not At Risk (09/14/2021)   Humiliation, Afraid, Rape, and Kick questionnaire    Fear of Current or Ex-Partner: No    Emotionally Abused: No    Physically Abused: No    Sexually Abused: No    Outpatient Medications Prior to Visit  Medication Sig Dispense Refill   Alcohol Swabs (DROPSAFE ALCOHOL PREP) 70 % PADS USE AS NEEDED TO CLEAN SKIN PRIOR TO FINGERPRICK AND INJECTION 100 each  5   allopurinol (ZYLOPRIM) 100 MG tablet TAKE 1/2 TABLET EVERY OTHER DAY 45 tablet 0   amLODipine (NORVASC) 5 MG tablet Take 1 tablet (5 mg total) by mouth daily. 90 tablet 3   apixaban (ELIQUIS) 5 MG TABS tablet Take 1 tablet (5 mg total) by mouth 2 (two) times daily. 180 tablet 3   Blood Glucose Monitoring Suppl (ACCU-CHEK GUIDE) w/Device KIT 1 Units by Does not apply route 2 (two) times daily. 1 kit 3   Colchicine 0.6 MG CAPS Take 0.6 mg by mouth daily as needed (Gout).     dapagliflozin propanediol (FARXIGA) 5 MG TABS tablet Take by mouth daily.     Dupilumab (DUPIXENT) 300 MG/2ML SOPN Inject 1 application into the skin every 14 (fourteen) days. 161 mL 4   folic acid (FOLVITE) 096 MCG tablet Take 400 mcg by mouth daily.     glucose blood (ACCU-CHEK GUIDE) test strip Use as instructed 100 each 12   hydrALAZINE (APRESOLINE) 25 MG tablet Take 50 mg by mouth in the morning and at bedtime.     Lancets (ACCU-CHEK SOFT TOUCH) lancets Use as instructed 100 each 12   leflunomide (ARAVA) 10 MG tablet Take 1 tablet (10 mg total) by mouth daily. 90 tablet 0   losartan (COZAAR) 50 MG tablet Take 50 mg by mouth daily.     nitroGLYCERIN (NITROSTAT) 0.4 MG SL tablet Place 0.4 mg under the tongue every 5 (five) minutes as needed for chest pain.      omeprazole (PRILOSEC) 40 MG capsule Take 1 capsule (40 mg total) by mouth daily. 90  capsule 3   rosuvastatin (CRESTOR) 40 MG tablet TAKE 1 TABLET EVERY DAY . STOP ATORVASTATIN 90 tablet 3   triamcinolone cream (KENALOG) 0.1 % APPLY 1 APPLICATION TOPICALLY DAILY AS NEEDED (RASH). (Patient taking differently: Apply 1 application  topically daily.) 454 g 3   No facility-administered medications prior to visit.    No Known Allergies  Review of Systems  Constitutional:  Negative for chills and fever.  Respiratory:  Negative for cough and shortness of breath.   Cardiovascular:  Negative for chest pain and palpitations.  Genitourinary:  Negative for dysuria and hematuria.  Skin:        Left foot lesion       Objective:    Physical Exam Vitals reviewed.  Constitutional:      General: He is not in acute distress.    Appearance: He is not diaphoretic.  HENT:     Head: Normocephalic and atraumatic.     Nose: Nose normal.     Mouth/Throat:     Mouth: Mucous membranes are moist.  Eyes:     General: No scleral icterus.    Extraocular Movements: Extraocular movements intact.  Cardiovascular:     Rate and Rhythm: Normal rate and regular rhythm.     Pulses: Normal pulses.     Heart sounds: Normal heart sounds. No murmur heard. Pulmonary:     Breath sounds: Normal breath sounds. No wheezing or rales.  Musculoskeletal:     Cervical back: Neck supple. No tenderness.     Right lower leg: No edema.     Left lower leg: No edema.  Feet:     Left foot:     Skin integrity: Callus (About 4 cm in diameter on the lateral border) present.  Skin:    General: Skin is warm.     Findings: Rash (Diffuse eczematous and dry, scaly patches over LE) present.  Neurological:     General: No focal deficit present.     Mental Status: He is alert and oriented to person, place, and time.     Cranial Nerves: No cranial nerve deficit.     Sensory: No sensory deficit.     Motor: No weakness.  Psychiatric:        Mood and Affect: Mood normal.        Behavior: Behavior normal.     BP  104/72 (BP Location: Left Arm, Patient Position: Sitting, Cuff Size: Normal)   Pulse 67   Resp 18   Ht 5' 10"  (1.778 m)   Wt 182 lb 6.4 oz (82.7 kg)   SpO2 97%   BMI 26.17 kg/m  Wt Readings from Last 3 Encounters:  09/10/22 182 lb 6.4 oz (82.7 kg)  09/03/22 182 lb (82.6 kg)  07/22/22 186 lb 9.6 oz (84.6 kg)        Assessment & Plan:   Problem List Items Addressed This Visit       Endocrine   Type 2 diabetes mellitus with diabetic chronic kidney disease ([place])    Lab Results  Component Value Date   HGBA1C 5.5 07/22/2022   HGBA1C 5.5 07/22/2022  Diet controlled Advised to follow diabetic diet On statin and ARB F/u CMP and lipid panel Diabetic eye exam: Advised to follow up with Ophthalmology for diabetic eye exam  Has callus on left foot, referred to podiatry as he is at risk of diabetic foot ulcer      Other Visit Diagnoses     Callus of foot    -  Primary Left foot callus, growing and slightly painful Has tried conservative measures Referred to podiatry as he would need excision of the callus   Relevant Orders   Ambulatory referral to Podiatry   Need for immunization against influenza       Relevant Orders   Flu Vaccine QUAD High Dose(Fluad) (Completed)        No orders of the defined types were placed in this encounter.    Lindell Spar, MD

## 2022-09-10 NOTE — Progress Notes (Signed)
Per pain diary, 100% relief of pain with second diagnostic facet blocks. We will proceed with radiofrequency ablation procedure.

## 2022-09-11 ENCOUNTER — Ambulatory Visit: Payer: Medicare HMO | Admitting: Internal Medicine

## 2022-09-13 ENCOUNTER — Ambulatory Visit: Payer: Medicare HMO | Admitting: Podiatry

## 2022-09-13 ENCOUNTER — Ambulatory Visit (INDEPENDENT_AMBULATORY_CARE_PROVIDER_SITE_OTHER): Payer: Medicare HMO

## 2022-09-13 DIAGNOSIS — M779 Enthesopathy, unspecified: Secondary | ICD-10-CM

## 2022-09-13 DIAGNOSIS — L97522 Non-pressure chronic ulcer of other part of left foot with fat layer exposed: Secondary | ICD-10-CM | POA: Diagnosis not present

## 2022-09-13 HISTORY — PX: OTHER SURGICAL HISTORY: SHX169

## 2022-09-13 NOTE — Progress Notes (Unsigned)
Subjective:  Patient ID: Jacob Farmer, male    DOB: 1946-04-07,  MRN: [id number]  Chief Complaint  Patient presents with   Callouses    Nail trim requested, callouse on the lateral side of 5th digit     76 y.o. male presents for wound care.  Patient presents with left submetatarsal 5 ulceration with fat layer exposed.  Patient states pain for touch is progressive gotten worse hurts with ambulation hurts with pressure.  He states that it has progressively gotten worse.  He is a diabetic.  He has history of rheumatoid arthritis.  His A1c is well controlled he has not seen anyone else prior to seeing me he denies any other acute complaints.   Review of Systems: Negative except as noted in the HPI. Denies N/V/F/Ch.  Past Medical History:  Diagnosis Date   CAD (coronary artery disease)    Acute myocardial infarction treated with TPA in 1990; 1999-stents to circumflex and RCA; residual total occlusion of the left anterior descending; normal ejection fraction; stress nuclear in 2001-distal anteroseptal ischemia; normal LV function   CKD (chronic kidney disease) stage 4, GFR 15-29 ml/min (HCC)    First degree heart block    History of gastric ulcer    History of kidney stones    History of MI (myocardial infarction)    1990-  treated w/ TPA   Hyperlipidemia    Hypertension    Jaundice    OA (osteoarthritis)    Prediabetes    Prostate cancer (HCC)    Stage T1c , Gleason 3+4,  PSA 4.7,  vol 24cc--  scheduled for radiative seed implants   Psoriatic arthritis (HCC)    RA (rheumatoid arthritis) ([place])    Dr. Toni Amend   Sinus bradycardia    Wears dentures     Current Outpatient Medications:    Alcohol Swabs (DROPSAFE ALCOHOL PREP) 70 % PADS, USE AS NEEDED TO CLEAN SKIN PRIOR TO FINGERPRICK AND INJECTION, Disp: 100 each, Rfl: 5   allopurinol (ZYLOPRIM) 100 MG tablet, TAKE 1/2 TABLET EVERY OTHER DAY, Disp: 45 tablet, Rfl: 0   amLODipine (NORVASC) 5 MG tablet, Take 1 tablet (5 mg total)  by mouth daily., Disp: 90 tablet, Rfl: 3   apixaban (ELIQUIS) 5 MG TABS tablet, Take 1 tablet (5 mg total) by mouth 2 (two) times daily., Disp: 180 tablet, Rfl: 3   Blood Glucose Monitoring Suppl (ACCU-CHEK GUIDE) w/Device KIT, 1 Units by Does not apply route 2 (two) times daily., Disp: 1 kit, Rfl: 3   Colchicine 0.6 MG CAPS, Take 0.6 mg by mouth daily as needed (Gout)., Disp: , Rfl:    dapagliflozin propanediol (FARXIGA) 5 MG TABS tablet, Take by mouth daily., Disp: , Rfl:    Dupilumab (DUPIXENT) 300 MG/2ML SOPN, Inject 1 application into the skin every 14 (fourteen) days., Disp: 563 mL, Rfl: 4   folic acid (FOLVITE) 149 MCG tablet, Take 400 mcg by mouth daily., Disp: , Rfl:    glucose blood (ACCU-CHEK GUIDE) test strip, Use as instructed, Disp: 100 each, Rfl: 12   hydrALAZINE (APRESOLINE) 25 MG tablet, Take 50 mg by mouth in the morning and at bedtime., Disp: , Rfl:    Lancets (ACCU-CHEK SOFT TOUCH) lancets, Use as instructed, Disp: 100 each, Rfl: 12   leflunomide (ARAVA) 10 MG tablet, Take 1 tablet (10 mg total) by mouth daily., Disp: 90 tablet, Rfl: 0   losartan (COZAAR) 50 MG tablet, Take 50 mg by mouth daily., Disp: , Rfl:  nitroGLYCERIN (NITROSTAT) 0.4 MG SL tablet, Place 0.4 mg under the tongue every 5 (five) minutes as needed for chest pain. , Disp: , Rfl:    omeprazole (PRILOSEC) 40 MG capsule, Take 1 capsule (40 mg total) by mouth daily., Disp: 90 capsule, Rfl: 3   rosuvastatin (CRESTOR) 40 MG tablet, TAKE 1 TABLET EVERY DAY . STOP ATORVASTATIN, Disp: 90 tablet, Rfl: 3   triamcinolone cream (KENALOG) 0.1 %, APPLY 1 APPLICATION TOPICALLY DAILY AS NEEDED (RASH). (Patient taking differently: Apply 1 application  topically daily.), Disp: 454 g, Rfl: 3  Social History   Tobacco Use  Smoking Status Former   Packs/day: 1.00   Years: 30.00   Total pack years: 30.00   Types: Cigarettes   Quit date: 05/21/1989   Years since quitting: 33.3   Passive exposure: Never  Smokeless Tobacco  Never    No Known Allergies Objective:  There were no vitals filed for this visit. There is no height or weight on file to calculate BMI. Constitutional Well developed. Well nourished.  Vascular Dorsalis pedis pulses palpable bilaterally. Posterior tibial pulses palpable bilaterally. Capillary refill normal to all digits.  No cyanosis or clubbing noted. Pedal hair growth normal.  Neurologic Normal speech. Oriented to person, place, and time. Protective sensation absent  Dermatologic Wound Location left submetatarsal 5 ulceration with fat layer exposed.  No malodor present no purulent drainage noted.  Does not probe down to bone. Wound Base: Mixed Granular/Fibrotic Peri-wound: Calloused Exudate: Scant/small amount Serosanguinous exudate Wound Measurements: -See above  Orthopedic: No pain to palpation either foot.   Radiographs: 3 views of skeletally mature adult left foot: No signs of osteomyelitis noted.  No breakdown of bone noted.  Slight osteopenia noted.  No other bony abnormalities identified. Assessment:   1. 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  Ulcer left submetatarsal 5 ulceration with fat layer exposed with underlying plantarflexed fifth metatarsal -Debridement as below. -Dressed with Betadine wet-to-dry, DSD. -Continue off-loading with surgical shoe.  Procedure: Excisional Debridement of Wound Tool: Sharp chisel blade/tissue nipper Rationale: Removal of non-viable soft tissue from the wound to promote healing.  Anesthesia: none Pre-Debridement Wound Measurements: 1 cm x 0.5 cm x 0.3 cm  Post-Debridement Wound Measurements: 1.1 cm x 0.6 cm x 0.3 cm  Type of Debridement: Sharp Excisional Tissue Removed: Non-viable soft tissue Blood loss: Minimal (<50cc) Depth of Debridement: subcutaneous tissue. Technique: Sharp excisional debridement to bleeding, viable wound base.   Wound Progress: This is my initial evaluation of continue monitor the progression of the wound Site healing conversation 7 Dressing: Dry, sterile, compression dressing. Disposition: Patient tolerated procedure well. Patient to return in 1 week for follow-up.  No follow-ups on file.

## 2022-09-20 ENCOUNTER — Ambulatory Visit (INDEPENDENT_AMBULATORY_CARE_PROVIDER_SITE_OTHER): Payer: Medicare HMO

## 2022-09-20 ENCOUNTER — Ambulatory Visit: Payer: Medicare HMO | Attending: Physician Assistant | Admitting: Physician Assistant

## 2022-09-20 ENCOUNTER — Encounter: Payer: Self-pay | Admitting: Physician Assistant

## 2022-09-20 VITALS — BP 158/84 | HR 70 | Resp 14 | Ht 70.0 in | Wt 182.8 lb

## 2022-09-20 DIAGNOSIS — M25661 Stiffness of right knee, not elsewhere classified: Secondary | ICD-10-CM

## 2022-09-20 DIAGNOSIS — Z87448 Personal history of other diseases of urinary system: Secondary | ICD-10-CM

## 2022-09-20 DIAGNOSIS — M19041 Primary osteoarthritis, right hand: Secondary | ICD-10-CM | POA: Diagnosis not present

## 2022-09-20 DIAGNOSIS — L97522 Non-pressure chronic ulcer of other part of left foot with fat layer exposed: Secondary | ICD-10-CM

## 2022-09-20 DIAGNOSIS — Z79899 Other long term (current) drug therapy: Secondary | ICD-10-CM | POA: Diagnosis not present

## 2022-09-20 DIAGNOSIS — Z8719 Personal history of other diseases of the digestive system: Secondary | ICD-10-CM | POA: Diagnosis not present

## 2022-09-20 DIAGNOSIS — Z8639 Personal history of other endocrine, nutritional and metabolic disease: Secondary | ICD-10-CM

## 2022-09-20 DIAGNOSIS — M5136 Other intervertebral disc degeneration, lumbar region: Secondary | ICD-10-CM | POA: Diagnosis not present

## 2022-09-20 DIAGNOSIS — Z8679 Personal history of other diseases of the circulatory system: Secondary | ICD-10-CM

## 2022-09-20 DIAGNOSIS — Z8546 Personal history of malignant neoplasm of prostate: Secondary | ICD-10-CM

## 2022-09-20 DIAGNOSIS — M25662 Stiffness of left knee, not elsewhere classified: Secondary | ICD-10-CM | POA: Diagnosis not present

## 2022-09-20 DIAGNOSIS — M19042 Primary osteoarthritis, left hand: Secondary | ICD-10-CM

## 2022-09-20 DIAGNOSIS — Z13820 Encounter for screening for osteoporosis: Secondary | ICD-10-CM

## 2022-09-20 DIAGNOSIS — M0600 Rheumatoid arthritis without rheumatoid factor, unspecified site: Secondary | ICD-10-CM | POA: Diagnosis not present

## 2022-09-20 DIAGNOSIS — M1A09X0 Idiopathic chronic gout, multiple sites, without tophus (tophi): Secondary | ICD-10-CM | POA: Diagnosis not present

## 2022-09-22 NOTE — Progress Notes (Signed)
X-rays of both knees are consistent with moderate osteoarthritis and moderate chondromalacia patella.  Please notify the patient.

## 2022-09-23 ENCOUNTER — Encounter: Payer: Self-pay | Admitting: Internal Medicine

## 2022-09-23 ENCOUNTER — Ambulatory Visit (INDEPENDENT_AMBULATORY_CARE_PROVIDER_SITE_OTHER): Payer: Medicare HMO | Admitting: Internal Medicine

## 2022-09-23 DIAGNOSIS — Z0000 Encounter for general adult medical examination without abnormal findings: Secondary | ICD-10-CM | POA: Diagnosis not present

## 2022-09-23 NOTE — Progress Notes (Signed)
He was not having knee pain at his recent OV.   Recommend seeing if stiffness improves after having facet joint injection is completed.

## 2022-09-23 NOTE — Progress Notes (Signed)
Subjective:  This is a telephone encounter between Jacob Farmer and Jacob Farmer on 09/23/2022 for AWV. The visit was conducted with the patient located at home and Jacob Farmer at [facility]. The patient's identity was confirmed using their DOB and current address. The patient has consented to being evaluated through a telephone encounter and understands the associated risks (an examination cannot be done and the patient may need to come in for an appointment) / benefits (allows the patient to remain at home, decreasing exposure to coronavirus).     Jacob Farmer is a 76 y.o. male who presents for Medicare Annual/Subsequent preventive examination.  Review of Systems    Review of Systems  All other systems reviewed and are negative.     Objective:    Today's Vitals   09/23/22 1038  PainSc: 0-No pain   There is no height or weight on file to calculate BMI.     09/23/2022   10:40 AM 04/03/2022    7:31 AM 09/14/2021   11:54 AM 01/03/2021    9:52 AM 08/25/2020   12:56 PM 08/27/2019    8:36 AM 08/24/2019   11:55 AM  Advanced Directives  Does Patient Have a Medical Advance Directive? Yes Yes No Yes No Yes Yes  Type of Advance Directive Living will Living will  [place];Living will  Living will Living will  Does patient want to make changes to medical advance directive?       No - Patient declined  Copy of [place] in Chart?    No - copy requested     Would patient like information on creating a medical advance directive?   No - Patient declined        Current Medications (verified) Outpatient Encounter Medications as of 09/23/2022  Medication Sig   Alcohol Swabs (DROPSAFE ALCOHOL PREP) 70 % PADS USE AS NEEDED TO CLEAN SKIN PRIOR TO FINGERPRICK AND INJECTION   allopurinol (ZYLOPRIM) 100 MG tablet TAKE 1/2 TABLET EVERY OTHER DAY   amLODipine (NORVASC) 5 MG tablet Take 1 tablet (5 mg total) by mouth daily.   apixaban (ELIQUIS) 5 MG TABS tablet Take 1  tablet (5 mg total) by mouth 2 (two) times daily.   Blood Glucose Monitoring Suppl (ACCU-CHEK GUIDE) w/Device KIT 1 Units by Does not apply route 2 (two) times daily.   Colchicine 0.6 MG CAPS Take 0.6 mg by mouth daily as needed (Gout).   dapagliflozin propanediol (FARXIGA) 5 MG TABS tablet Take by mouth daily.   Dupilumab (DUPIXENT) 300 MG/2ML SOPN Inject 1 application into the skin every 14 (fourteen) days.   folic acid (FOLVITE) 223 MCG tablet Take 400 mcg by mouth daily.   glucose blood (ACCU-CHEK GUIDE) test strip Use as instructed   hydrALAZINE (APRESOLINE) 25 MG tablet Take 50 mg by mouth in the morning and at bedtime.   Lancets (ACCU-CHEK SOFT TOUCH) lancets Use as instructed   leflunomide (ARAVA) 10 MG tablet Take 1 tablet (10 mg total) by mouth daily.   losartan (COZAAR) 50 MG tablet Take 50 mg by mouth daily.   nitroGLYCERIN (NITROSTAT) 0.4 MG SL tablet Place 0.4 mg under the tongue every 5 (five) minutes as needed for chest pain.    omeprazole (PRILOSEC) 40 MG capsule Take 1 capsule (40 mg total) by mouth daily.   rosuvastatin (CRESTOR) 40 MG tablet TAKE 1 TABLET EVERY DAY . STOP ATORVASTATIN   triamcinolone cream (KENALOG) 0.1 % APPLY 1 APPLICATION TOPICALLY DAILY  AS NEEDED (RASH). (Patient taking differently: Apply 1 application  topically daily.)   No facility-administered encounter medications on file as of 09/23/2022.    Allergies (verified) Patient has no known allergies.   History: Past Medical History:  Diagnosis Date   CAD (coronary artery disease)    Acute myocardial infarction treated with TPA in 1990; 1999-stents to circumflex and RCA; residual total occlusion of the left anterior descending; normal ejection fraction; stress nuclear in 2001-distal anteroseptal ischemia; normal LV function   CKD (chronic kidney disease) stage 4, GFR 15-29 ml/min (HCC)    First degree heart block    History of gastric ulcer    History of kidney stones    History of MI (myocardial  infarction)    1990-  treated w/ TPA   Hyperlipidemia    Hypertension    Jaundice    OA (osteoarthritis)    Prediabetes    Prostate cancer ([place])    Stage T1c , Gleason 3+4,  PSA 4.7,  vol 24cc--  scheduled for radiative seed implants   Psoriatic arthritis ([place])    RA (rheumatoid arthritis) ([place])    Dr. Toni Amend   Sinus bradycardia    Wears dentures    Past Surgical History:  Procedure Laterality Date   BIOPSY  08/27/2019   Procedure: BIOPSY;  Surgeon: Rogene Houston, MD;  [facility];  Service: Endoscopy;;  gastric   BIOPSY  04/03/2022   Procedure: BIOPSY;  Surgeon: Montez Morita, Quillian Quince, MD;  [facility];  Service: Gastroenterology;;   callus removal Left 09/13/2022   left foot   CARDIOVASCULAR STRESS TEST  2001  per Dr Lattie Haw clinic note   distal anteroseptal ischemia,  normal LVF   COLONOSCOPY  last one 2006 (approx)   COLONOSCOPY WITH PROPOFOL N/A 04/03/2022   Procedure: COLONOSCOPY WITH PROPOFOL;  Surgeon: Harvel Quale, MD;  [facility];  Service: Gastroenterology;  Laterality: N/A;  [place]   DES x2  to CFX and RCA/  residual total occlusion LAD,  normal LVEF   ESOPHAGOGASTRODUODENOSCOPY (EGD) WITH PROPOFOL N/A 08/27/2019   Procedure: ESOPHAGOGASTRODUODENOSCOPY (EGD) WITH PROPOFOL;  Surgeon: Rogene Houston, MD;  [facility];  Service: Endoscopy;  Laterality: N/A;  10:10   ESOPHAGOGASTRODUODENOSCOPY (EGD) WITH PROPOFOL N/A 04/03/2022   Procedure: ESOPHAGOGASTRODUODENOSCOPY (EGD) WITH PROPOFOL;  Surgeon: Harvel Quale, MD;  [facility];  Service: Gastroenterology;  Laterality: N/A;   EYE SURGERY Bilateral    cataract   HOT HEMOSTASIS  04/03/2022   Procedure: HOT HEMOSTASIS (ARGON PLASMA COAGULATION/BICAP);  Surgeon: Montez Morita, Quillian Quince, MD;  [facility];  Service: Gastroenterology;;   POLYPECTOMY  04/03/2022    Procedure: POLYPECTOMY;  Surgeon: Harvel Quale, MD;  [facility];  Service: Gastroenterology;;   POSTERIOR CERVICAL FUSION/FORAMINOTOMY N/A 05/26/2017   Procedure: RIGHT C4-5 FORAMINOTOMY WITH EXCISION OF HERNIATED [place];  Surgeon: Jessy Oto, MD;  Location: [place];  Service: Orthopedics;  Laterality: N/A;   RADIOACTIVE SEED IMPLANT N/A 01/11/2016   Procedure: RADIOACTIVE SEED IMPLANT/BRACHYTHERAPY IMPLANT;  Surgeon: Franchot Gallo, MD;  Location[facility];  Service: Urology;  Laterality: N/A;   66  seeds implanted no seeds founds in bladder   Family History  Problem Relation Age of Onset   Heart attack Mother    Coronary artery disease Father    Ulcerative colitis Father    Ulcers Father  Breast cancer Sister    COPD Brother    Pancreatic cancer Brother    Healthy Sister    Diabetes Brother    Cirrhosis Son    Seizures Son    Social History   Socioeconomic History   Marital status: Married    Spouse name: Not on file   Number of children: 2   Years of education: Not on file   Highest education level: Not on file  Occupational History   Occupation: Retired Clinical biochemist  Tobacco Use   Smoking status: Former    Packs/day: 1.00    Years: 30.00    Total pack years: 30.00    Types: Cigarettes    Quit date: 05/21/1989    Years since quitting: 33.3    Passive exposure: Never   Smokeless tobacco: Never  Vaping Use   Vaping Use: Never used  Substance and Sexual Activity   Alcohol use: Yes    Comment: 1 beer and 1 mix drink per day   Drug use: No   Sexual activity: Not on file  Other Topics Concern   Not on file  Social History Narrative   Married for 24 years.Retired Clinical biochemist.   Social Determinants of Health   Financial Resource Strain: Low Risk  (09/14/2021)   Overall Financial Resource Strain (CARDIA)    Difficulty of Paying Living Expenses: Not hard at all  Food Insecurity: No Food Insecurity (09/14/2021)   Hunger  Vital Sign    Worried About Running Out of Food in the Last Year: Never true    Ran Out of Food in the Last Year: Never true  Transportation Needs: No Transportation Needs (09/14/2021)   PRAPARE - Hydrologist (Medical): No    Lack of Transportation (Non-Medical): No  Physical Activity: Sufficiently Active (09/14/2021)   Exercise Vital Sign    Days of Exercise per Week: 7 days    Minutes of Exercise per Session: 60 min  Stress: No Stress Concern Present (09/14/2021)   [place]    Feeling of Stress : Not at all  Social Connections: Moderately Integrated (09/14/2021)   Social Connection and Isolation Panel [NHANES]    Frequency of Communication with Friends and Family: More than three times a week    Frequency of Social Gatherings with Friends and Family: More than three times a week    Attends Religious Services: More than 4 times per year    Active Member of [employer] or Organizations: No    Attends Music therapist: Never    Marital Status: Married    Tobacco Counseling Counseling given: Not Answered   Clinical Intake:  Pre-visit preparation completed: Yes  Pain : No/denies pain Pain Score: 0-No pain     Diabetes: No  How often do you need to have someone help you when you read instructions, pamphlets, or other written materials from your doctor or pharmacy?: 1 - Never  Diabetic?no         Activities of Daily Living    09/23/2022   10:41 AM  In your present state of health, do you have any difficulty performing the following activities:  Hearing? 0  Vision? 0  Difficulty concentrating or making decisions? 0  Walking or climbing stairs? 0  Dressing or bathing? 0  Doing errands, shopping? 0    Patient Care Team: Lindell Spar, MD as PCP - General (Internal Medicine) Harl Bowie Alphonse Guild, MD as PCP -  Cardiology (Cardiology) Harl Bowie, Alphonse Guild, MD as  Consulting Physician (Cardiology) Lavonna Monarch, MD (Inactive) as Consulting Physician (Dermatology)  Indicate any recent Medical Services you may have received from other than [facility] providers in the past year (date may be approximate).     Assessment:   This is a routine wellness examination for [place].  Hearing/Vision screen No results found.  Dietary issues and exercise activities discussed:     Goals Addressed   None    Depression Screen    09/23/2022   10:40 AM 09/10/2022   10:24 AM 07/22/2022    9:53 AM 04/08/2022   10:34 AM 01/22/2022    9:43 AM 09/14/2021   11:54 AM 09/14/2021   11:53 AM  PHQ 2/9 Scores  PHQ - 2 Score 0 0 0 0 0 0 0    Fall Risk    09/23/2022   10:40 AM 09/10/2022   10:24 AM 07/22/2022    9:53 AM 04/08/2022   10:34 AM 01/22/2022    9:43 AM  Fall Risk   Falls in the past year? 0 0 0 0 0  Number falls in past yr: 0 0 0 0 0  Injury with Fall? 0 0 0 0 0  Risk for fall due to :  No Fall Risks No Fall Risks No Fall Risks No Fall Risks  Follow up  Falls evaluation completed Falls evaluation completed Falls evaluation completed Falls evaluation completed    [place]:  Any stairs in or around the home? No  If so, are there any without handrails? No  Home free of loose throw rugs in walkways, pet beds, electrical cords, etc? Yes  Adequate lighting in your home to reduce risk of falls? Yes   ASSISTIVE DEVICES UTILIZED TO PREVENT FALLS:  Life alert? No  Use of a cane, walker or w/c? No  Grab bars in the bathroom? Yes  Shower chair or bench in shower? Yes  Elevated toilet seat or a handicapped toilet? Yes     Cognitive Function:    09/14/2021   11:42 AM  MMSE - Mini Mental State Exam  Not completed: Unable to complete        09/23/2022   10:41 AM 09/14/2021   11:55 AM 08/16/2020   10:24 AM  6CIT Screen  What Year? 0 points 0 points 0 points  What month? 0 points 0 points 0 points  What time? 0 points 0 points 0  points  Count back from 20 0 points 0 points 0 points  Months in reverse 0 points 0 points 0 points  Repeat phrase 2 points 0 points 2 points  Total Score 2 points 0 points 2 points    Immunizations Immunization History  Administered Date(s) Administered   Fluad Quad(high Dose 65+) 09/10/2022   Influenza, High Dose Seasonal PF 09/12/2017, 08/28/2019, 08/28/2019   Influenza-Unspecified 08/08/2020, 08/24/2021   PFIZER(Purple Top)SARS-COV-2 Vaccination 01/22/2020, 02/12/2020, 08/15/2020, 01/19/2021   PNEUMOCOCCAL CONJUGATE-20 01/28/2022   Pneumococcal Polysaccharide-23 08/16/2009   Td 10/26/2021   Tdap 09/28/2021   Zoster Recombinat (Shingrix) 07/29/2019, 09/28/2021    TDAP status: Up to date  Flu Vaccine status: Up to date  Pneumococcal vaccine status: Up to date  Covid-19 vaccine status: Information provided on how to obtain vaccines.   Qualifies for Shingles Vaccine? Yes   Zostavax completed No   Shingrix Completed?: Yes  Screening Tests Health Maintenance  Topic Date Due   COVID-19 Vaccine (5 - Pfizer risk series) 03/16/2021  OPHTHALMOLOGY EXAM  06/25/2022   FOOT EXAM  01/22/2023   HEMOGLOBIN A1C  01/22/2023   Diabetic kidney evaluation - Urine ACR  07/20/2023   Diabetic kidney evaluation - GFR measurement  08/29/2023   TETANUS/TDAP  10/27/2031   Pneumonia Vaccine 53+ Years old  Completed   INFLUENZA VACCINE  Completed   Hepatitis C Screening  Completed   Zoster Vaccines- Shingrix  Completed   HPV VACCINES  Aged Out   COLONOSCOPY (Pts 45-64yr Insurance coverage will need to be confirmed)  Discontinued    Health Maintenance  Health Maintenance Due  Topic Date Due   COVID-19 Vaccine (5 - Pfizer risk series) 03/16/2021   OPHTHALMOLOGY EXAM  06/25/2022    Colorectal cancer screening: Type of screening: Colonoscopy. Completed 04/03/2022. Repeat every 10 years, Completed.   Lung Cancer Screening: (Low Dose CT Chest recommended if Age 78-80years, 30 pack-year  currently smoking OR have quit w/in 15years.) does not qualify.   Additional Screening:  Hepatitis C Screening: does qualify; Completed 11/02/2020  Vision Screening: Recommended annual ophthalmology exams for early detection of glaucoma and other disorders of the eye. Is the patient up to date with their annual eye exam?  Yes  Who is the provider or what is the name of the office in which the patient attends annual eye exams? Dr.Cotter If pt is not established with a provider, would they like to be referred to a provider to establish care? No .   Dental Screening: Recommended annual dental exams for proper oral hygiene  Community Resource Referral / Chronic Care Management: CRR required this visit?  No   CCM required this visit?  No      Plan:     I have personally reviewed and noted the following in the patient's chart:   Medical and social history Use of alcohol, tobacco or illicit drugs  Current medications and supplements including opioid prescriptions. Patient is not currently taking opioid prescriptions. Functional ability and status Nutritional status Physical activity Advanced directives List of other physicians Hospitalizations, surgeries, and ER visits in previous 12 months Vitals Screenings to include cognitive, depression, and falls Referrals and appointments  In addition, I have reviewed and discussed with patient certain preventive protocols, quality metrics, and best practice recommendations. A written personalized care plan for preventive services as well as general preventive health recommendations were provided to patient.     JLorene Dy MD   09/23/2022

## 2022-09-23 NOTE — Patient Instructions (Signed)
  Jacob Farmer , Thank you for taking time to come for your Medicare Wellness Visit. I appreciate your ongoing commitment to your health goals. Please review the following plan we discussed and let me know if I can assist you in the future.   These are the goals we discussed: Continue working with your PCP. You are doing a great job taking care of your health.   This is a list of the screening recommended for you and due dates:  Health Maintenance  Topic Date Due   COVID-19 Vaccine (5 - Pfizer risk series) 03/16/2021   Eye exam for diabetics  06/25/2022   Complete foot exam   01/22/2023   Hemoglobin A1C  01/22/2023   Yearly kidney health urinalysis for diabetes  07/20/2023   Yearly kidney function blood test for diabetes  08/29/2023   Tetanus Vaccine  10/27/2031   Pneumonia Vaccine  Completed   Flu Shot  Completed   Hepatitis C Screening: USPSTF Recommendation to screen - Ages 18-79 yo.  Completed   Zoster (Shingles) Vaccine  Completed   HPV Vaccine  Aged Out   Colon Cancer Screening  Discontinued

## 2022-10-04 ENCOUNTER — Ambulatory Visit: Payer: Medicare HMO | Admitting: Cardiology

## 2022-10-07 ENCOUNTER — Ambulatory Visit (INDEPENDENT_AMBULATORY_CARE_PROVIDER_SITE_OTHER): Payer: Medicare HMO | Admitting: Gastroenterology

## 2022-10-07 DIAGNOSIS — D631 Anemia in chronic kidney disease: Secondary | ICD-10-CM | POA: Diagnosis not present

## 2022-10-07 DIAGNOSIS — R809 Proteinuria, unspecified: Secondary | ICD-10-CM | POA: Diagnosis not present

## 2022-10-07 DIAGNOSIS — I129 Hypertensive chronic kidney disease with stage 1 through stage 4 chronic kidney disease, or unspecified chronic kidney disease: Secondary | ICD-10-CM | POA: Diagnosis not present

## 2022-10-07 DIAGNOSIS — E211 Secondary hyperparathyroidism, not elsewhere classified: Secondary | ICD-10-CM | POA: Diagnosis not present

## 2022-10-07 DIAGNOSIS — N189 Chronic kidney disease, unspecified: Secondary | ICD-10-CM | POA: Diagnosis not present

## 2022-10-07 DIAGNOSIS — N184 Chronic kidney disease, stage 4 (severe): Secondary | ICD-10-CM | POA: Diagnosis not present

## 2022-10-07 DIAGNOSIS — I5032 Chronic diastolic (congestive) heart failure: Secondary | ICD-10-CM | POA: Diagnosis not present

## 2022-10-07 LAB — MICROALBUMIN / CREATININE URINE RATIO: Microalb Creat Ratio: 0.775

## 2022-10-09 ENCOUNTER — Ambulatory Visit: Payer: Medicare HMO | Admitting: Podiatry

## 2022-10-09 ENCOUNTER — Encounter: Payer: Self-pay | Admitting: Podiatry

## 2022-10-09 DIAGNOSIS — L97522 Non-pressure chronic ulcer of other part of left foot with fat layer exposed: Secondary | ICD-10-CM

## 2022-10-09 NOTE — Progress Notes (Signed)
Subjective:  Patient ID: Jacob Farmer, male    DOB: Mar 10, 1946,  MRN: [id number]  Chief Complaint  Patient presents with   Foot Ulcer    Left foot ulcer     76 y.o. male presents for wound care.  Patient Jacob Farmer for follow-up of left submetatarsal 5 ulceration.  He states he is doing a lot better is not as achy.  Is been wearing surgical shoe he denies any other acute complaints has been doing Betadine wet-to-dry dressing  Review of Systems: Negative except as noted in the HPI. Denies N/V/F/Ch.  Past Medical History:  Diagnosis Date   CAD (coronary artery disease)    Acute myocardial infarction treated with TPA in 1990; 1999-stents to circumflex and RCA; residual total occlusion of the left anterior descending; normal ejection fraction; stress nuclear in 2001-distal anteroseptal ischemia; normal LV function   CKD (chronic kidney disease) stage 4, GFR 15-29 ml/min (HCC)    First degree heart block    History of gastric ulcer    History of kidney stones    History of MI (myocardial infarction)    1990-  treated w/ TPA   Hyperlipidemia    Hypertension    Jaundice    OA (osteoarthritis)    Prediabetes    Prostate cancer (HCC)    Stage T1c , Gleason 3+4,  PSA 4.7,  vol 24cc--  scheduled for radiative seed implants   Psoriatic arthritis (HCC)    RA (rheumatoid arthritis) ([place])    Dr. Toni Amend   Sinus bradycardia    Wears dentures     Current Outpatient Medications:    Alcohol Swabs (DROPSAFE ALCOHOL PREP) 70 % PADS, USE AS NEEDED TO CLEAN SKIN PRIOR TO FINGERPRICK AND INJECTION, Disp: 100 each, Rfl: 5   allopurinol (ZYLOPRIM) 100 MG tablet, TAKE 1/2 TABLET EVERY OTHER DAY, Disp: 45 tablet, Rfl: 0   amLODipine (NORVASC) 5 MG tablet, Take 1 tablet (5 mg total) by mouth daily., Disp: 90 tablet, Rfl: 3   apixaban (ELIQUIS) 5 MG TABS tablet, Take 1 tablet (5 mg total) by mouth 2 (two) times daily., Disp: 180 tablet, Rfl: 3   Blood Glucose Monitoring Suppl (ACCU-CHEK GUIDE)  w/Device KIT, 1 Units by Does not apply route 2 (two) times daily., Disp: 1 kit, Rfl: 3   Colchicine 0.6 MG CAPS, Take 0.6 mg by mouth daily as needed (Gout)., Disp: , Rfl:    dapagliflozin propanediol (FARXIGA) 5 MG TABS tablet, Take by mouth daily., Disp: , Rfl:    Dupilumab (DUPIXENT) 300 MG/2ML SOPN, Inject 1 application into the skin every 14 (fourteen) days., Disp: 532 mL, Rfl: 4   folic acid (FOLVITE) 992 MCG tablet, Take 400 mcg by mouth daily., Disp: , Rfl:    glucose blood (ACCU-CHEK GUIDE) test strip, Use as instructed, Disp: 100 each, Rfl: 12   hydrALAZINE (APRESOLINE) 25 MG tablet, Take 50 mg by mouth in the morning and at bedtime., Disp: , Rfl:    Lancets (ACCU-CHEK SOFT TOUCH) lancets, Use as instructed, Disp: 100 each, Rfl: 12   leflunomide (ARAVA) 10 MG tablet, Take 1 tablet (10 mg total) by mouth daily., Disp: 90 tablet, Rfl: 0   losartan (COZAAR) 50 MG tablet, Take 50 mg by mouth daily., Disp: , Rfl:    nitroGLYCERIN (NITROSTAT) 0.4 MG SL tablet, Place 0.4 mg under the tongue every 5 (five) minutes as needed for chest pain. , Disp: , Rfl:    omeprazole (PRILOSEC) 40 MG capsule, Take 1 capsule (  40 mg total) by mouth daily., Disp: 90 capsule, Rfl: 3   rosuvastatin (CRESTOR) 40 MG tablet, TAKE 1 TABLET EVERY DAY . STOP ATORVASTATIN, Disp: 90 tablet, Rfl: 3   triamcinolone cream (KENALOG) 0.1 %, APPLY 1 APPLICATION TOPICALLY DAILY AS NEEDED (RASH). (Patient taking differently: Apply 1 application  topically daily.), Disp: 454 g, Rfl: 3  Social History   Tobacco Use  Smoking Status Former   Packs/day: 1.00   Years: 30.00   Total pack years: 30.00   Types: Cigarettes   Quit date: 05/21/1989   Years since quitting: 33.4   Passive exposure: Never  Smokeless Tobacco Never    No Known Allergies Objective:  There were no vitals filed for this visit. There is no height or weight on file to calculate BMI. Constitutional Well developed. Well nourished.  Vascular Dorsalis pedis  pulses palpable bilaterally. Posterior tibial pulses palpable bilaterally. Capillary refill normal to all digits.  No cyanosis or clubbing noted. Pedal hair growth normal.  Neurologic Normal speech. Oriented to person, place, and time. Protective sensation absent  Dermatologic Wound Location left submetatarsal 5 ulceration with fat layer exposed.  No malodor present no purulent drainage noted.  Does not probe down to bone. Wound Base: Mixed Granular/Fibrotic Peri-wound: Calloused Exudate: Scant/small amount Serosanguinous exudate Wound Measurements: -See above  Orthopedic: No pain to palpation either foot.   Radiographs: 3 views of skeletally mature adult left foot: No signs of osteomyelitis noted.  No breakdown of bone noted.  Slight osteopenia noted.  No other bony abnormalities identified. Assessment:   1. 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  Ulcer left submetatarsal 5 ulceration with fat layer exposed with underlying plantarflexed fifth metatarsal -Debridement as below. -Dressed with Betadine wet-to-dry, DSD. -Continue off-loading with surgical shoe.  Procedure: Excisional Debridement of Wound Tool: Sharp chisel blade/tissue nipper Rationale: Removal of non-viable soft tissue from the wound to promote healing.  Anesthesia: none Pre-Debridement Wound Measurements: 0.5 cm x 0.3 cm x 0.3 cm  Post-Debridement Wound Measurements: 0.6 cm x 0.4 cm x 0.3 cm Type of Debridement: Sharp Excisional Tissue Removed: Non-viable soft tissue Blood loss: Minimal (<50cc) Depth of Debridement: subcutaneous tissue. Technique: Sharp excisional debridement to bleeding, viable wound base.  Wound Progress: The wound is decreased considerably Dressing: Dry, sterile, compression dressing. Disposition: Patient tolerated procedure well. Patient to return in 1 week for follow-up.  No follow-ups on file.

## 2022-10-14 DIAGNOSIS — N184 Chronic kidney disease, stage 4 (severe): Secondary | ICD-10-CM | POA: Diagnosis not present

## 2022-10-14 DIAGNOSIS — D631 Anemia in chronic kidney disease: Secondary | ICD-10-CM | POA: Diagnosis not present

## 2022-10-14 DIAGNOSIS — I5032 Chronic diastolic (congestive) heart failure: Secondary | ICD-10-CM | POA: Diagnosis not present

## 2022-10-14 DIAGNOSIS — I129 Hypertensive chronic kidney disease with stage 1 through stage 4 chronic kidney disease, or unspecified chronic kidney disease: Secondary | ICD-10-CM | POA: Diagnosis not present

## 2022-10-14 DIAGNOSIS — N189 Chronic kidney disease, unspecified: Secondary | ICD-10-CM | POA: Diagnosis not present

## 2022-10-14 DIAGNOSIS — N170 Acute kidney failure with tubular necrosis: Secondary | ICD-10-CM | POA: Diagnosis not present

## 2022-10-14 DIAGNOSIS — E211 Secondary hyperparathyroidism, not elsewhere classified: Secondary | ICD-10-CM | POA: Diagnosis not present

## 2022-10-14 DIAGNOSIS — R809 Proteinuria, unspecified: Secondary | ICD-10-CM | POA: Diagnosis not present

## 2022-10-15 ENCOUNTER — Ambulatory Visit: Payer: Medicare HMO | Admitting: Physical Medicine and Rehabilitation

## 2022-10-15 ENCOUNTER — Ambulatory Visit: Payer: Self-pay

## 2022-10-15 VITALS — BP 176/81 | HR 76

## 2022-10-15 DIAGNOSIS — M47816 Spondylosis without myelopathy or radiculopathy, lumbar region: Secondary | ICD-10-CM | POA: Diagnosis not present

## 2022-10-15 MED ORDER — METHYLPREDNISOLONE ACETATE 80 MG/ML IJ SUSP
40.0000 mg | Freq: Once | INTRAMUSCULAR | Status: AC
  Administered 2022-10-15: 40 mg

## 2022-10-15 NOTE — Progress Notes (Unsigned)
Numeric Pain Rating Scale and Functional Assessment Average Pain 0   In the last MONTH (on 0-10 scale) has pain interfered with the following?  1. General activity like being  able to carry out your everyday physical activities such as walking, climbing stairs, carrying groceries, or moving a chair?  Rating(8)   +Driver, -BT, -Dye Allergies.  Standing for long periods of time and walking makes pain worse. Takes no pain medication

## 2022-10-15 NOTE — Patient Instructions (Signed)

## 2022-10-16 ENCOUNTER — Ambulatory Visit: Payer: Medicare HMO | Admitting: Dermatology

## 2022-10-17 NOTE — Procedures (Signed)
Lumbar Facet Joint Nerve Denervation  Patient: Jacob Farmer      Date of Birth: October 05, 1946 MRN: [id number] PCP: Lindell Spar, MD      Visit Date: 10/15/2022   Universal Protocol:    Date/Time: 11/02/237:59 AM  Consent Given By: the patient  Position: PRONE  Additional Comments: Vital signs were monitored before and after the procedure. Patient was prepped and draped in the usual sterile fashion. The correct patient, procedure, and site was verified.   Injection Procedure Details:   Procedure diagnoses:  1. Spondylosis without myelopathy or radiculopathy, lumbar region      Meds Administered:  Meds ordered this encounter  Medications   methylPREDNISolone acetate (DEPO-MEDROL) injection 40 mg     Laterality: Bilateral  Location/Site:  L4-L5, L3 and L4 medial branches  Needle: 18 ga.,  50m active tip, 1026mRF Cannula  Needle Placement: Along juncture of superior articular process and transverse pocess  Findings:  -Comments:  Procedure Details: For each desired target nerve, the corresponding transverse process (sacral ala for the L5 dorsal rami) was identified and the fluoroscope was positioned to square off the endplates of the corresponding vertebral body to achieve a true AP midline view.  The beam was then obliqued 15 to 20 degrees and caudally tilted 15 to 20 degrees to line up a trajectory along the target nerves. The skin over the target of the junction of superior articulating process and transverse process (sacral ala for the L5 dorsal rami) was infiltrated with 33m5mf 1% Lidocaine without Epinephrine.  The 18 gauge 39m38mtive tip outer cannula was advanced in trajectory view to the target.  This procedure was repeated for each target nerve.  Then, for all levels, the outer cannula placement was fine-tuned and the position was then confirmed with bi-planar imaging.    Test stimulation was done both at sensory and motor levels to ensure there was no  radicular stimulation. The target tissues were then infiltrated with 1 ml of 1% Lidocaine without Epinephrine. Subsequently, a percutaneous neurotomy was carried out for 90 seconds at 80 degrees Celsius.  After the completion of the lesion, 1 ml of injectate was delivered. It was then repeated for each facet joint nerve mentioned above. Appropriate radiographs were obtained to verify the probe placement during the neurotomy.   Additional Comments:  The patient tolerated the procedure well Dressing: 2 x 2 sterile gauze and Band-Aid    Post-procedure details: Patient was observed during the procedure. Post-procedure instructions were reviewed.  Patient left the clinic in stable condition.

## 2022-10-17 NOTE — Progress Notes (Signed)
Jacob Farmer - 76 y.o. male MRN [id number]  Date of birth: 1945-12-31  Office Visit Note: Visit Date: 10/15/2022 PCP: Lindell Spar, MD Referred by: Lindell Spar, MD  Subjective: Chief Complaint  Patient presents with   Lower Back - Pain   HPI:  Jacob Farmer is a 76 y.o. male who comes in todayfor planned radiofrequency ablation of the Bilateral L4-5 Lumbar facet joints. This would be ablation of the corresponding medial branches and/or dorsal rami.  Patient has had double diagnostic blocks with more than 50% relief.  These are documented on pain diary.  They have had chronic back pain for quite some time, more than 3 months, which has been an ongoing situation with recalcitrant axial back pain.  They have no radicular pain.  Their axial pain is worse with standing and ambulating and on exam today with facet loading.  They have had physical therapy as well as home exercise program.  The imaging noted in the chart below indicated facet pathology. Accordingly they meet all the criteria and qualification for for radiofrequency ablation and we are going to complete this today hopefully for more longer term relief as part of comprehensive management program.   ROS Otherwise per HPI.  Assessment & Plan: Visit Diagnoses:    ICD-10-CM   1. Spondylosis without myelopathy or radiculopathy, lumbar region  M47.816 XR C-ARM NO REPORT    Radiofrequency,Lumbar    methylPREDNISolone acetate (DEPO-MEDROL) injection 40 mg      Plan: No additional findings.   Meds & Orders:  Meds ordered this encounter  Medications   methylPREDNISolone acetate (DEPO-MEDROL) injection 40 mg    Orders Placed This Encounter  Procedures   Radiofrequency,Lumbar   XR C-ARM NO REPORT    Follow-up: Return for visit to requesting provider as needed.   Procedures: No procedures performed  Lumbar Facet Joint Nerve Denervation  Patient: Jacob Farmer      Date of Birth: 04-Sep-1946 MRN: [id number] PCP: Lindell Spar, MD      Visit Date: 10/15/2022   Universal Protocol:    Date/Time: 11/02/237:59 AM  Consent Given By: the patient  Position: PRONE  Additional Comments: Vital signs were monitored before and after the procedure. Patient was prepped and draped in the usual sterile fashion. The correct patient, procedure, and site was verified.   Injection Procedure Details:   Procedure diagnoses:  1. Spondylosis without myelopathy or radiculopathy, lumbar region      Meds Administered:  Meds ordered this encounter  Medications   methylPREDNISolone acetate (DEPO-MEDROL) injection 40 mg     Laterality: Bilateral  Location/Site:  L4-L5, L3 and L4 medial branches  Needle: 18 ga.,  86m active tip, 1064mRF Cannula  Needle Placement: Along juncture of superior articular process and transverse pocess  Findings:  -Comments:  Procedure Details: For each desired target nerve, the corresponding transverse process (sacral ala for the L5 dorsal rami) was identified and the fluoroscope was positioned to square off the endplates of the corresponding vertebral body to achieve a true AP midline view.  The beam was then obliqued 15 to 20 degrees and caudally tilted 15 to 20 degrees to line up a trajectory along the target nerves. The skin over the target of the junction of superior articulating process and transverse process (sacral ala for the L5 dorsal rami) was infiltrated with 58m49mf 1% Lidocaine without Epinephrine.  The 18 gauge 76m72mtive tip outer cannula was advanced in trajectory view to  the target.  This procedure was repeated for each target nerve.  Then, for all levels, the outer cannula placement was fine-tuned and the position was then confirmed with bi-planar imaging.    Test stimulation was done both at sensory and motor levels to ensure there was no radicular stimulation. The target tissues were then infiltrated with 1 ml of 1% Lidocaine without Epinephrine. Subsequently, a  percutaneous neurotomy was carried out for 90 seconds at 80 degrees Celsius.  After the completion of the lesion, 1 ml of injectate was delivered. It was then repeated for each facet joint nerve mentioned above. Appropriate radiographs were obtained to verify the probe placement during the neurotomy.   Additional Comments:  The patient tolerated the procedure well Dressing: 2 x 2 sterile gauze and Band-Aid    Post-procedure details: Patient was observed during the procedure. Post-procedure instructions were reviewed.  Patient left the clinic in stable condition.      Clinical History: MRI lumbar spine:   INDICATION: Lumbar radiculopathy.   TECHNIQUE: Sagittal and axial T1 and T2-weighted sequences were performed. Additional sagittal STIR images were performed.   COMPARISON: None available at time of dictation.   FINDINGS:  There are 5 nonrib-bearing lumbar-type vertebrae. The conus medullaris terminates at a normal level. Grade 1 retrolisthesis of L2 on L3 and of L5 on S1. Severe L2-L3 intervertebral disc height loss with moderate height loss at L5-S1. The vertebral body heights are maintained. Edematous endplate signal changes at L2-L3 and less so at L3-4, nonspecific but likely degenerative in etiology. Colonic diverticulosis.   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   L1-2: No significant spinal canal or neural foraminal stenosis.  L2-3: Disc bulge contributes to moderate spinal canal stenosis. Disc bulge and facet arthrosis contribute to mild/moderate left neural foraminal stenosis.  L3-4: Disc bulge contributes to mild/moderate spinal canal stenosis and along with facet arthrosis contributes to mild left and moderate right neural foraminal stenosis. Disc bulge. No significant spinal canal stenosis. Mild/moderate left and mild right neural foraminal stenosis. Facet arthrosis.  L4-5: Disc bulge. No significant spinal canal  stenosis. Posterior marginal osteophytosis and facet arthrosis contribute to severe bilateral neural foraminal stenosis.  L5-S1: No significant spinal canal or neural foraminal stenosis.    IMPRESSION:  Degenerative changes of the lumbar spine most prominent at L5-S1.   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 A CTA would better assess this.   Electronically Signed by: Linden Dolin on 01/25/2022 10:35 AM     Objective:  VS:  HT:    WT:   BMI:     BP:(!) 176/81  HR:76bpm  TEMP: ( )  RESP:  Physical Exam Vitals and nursing note reviewed.  Constitutional:      General: He is not in acute distress.    Appearance: Normal appearance. He is not ill-appearing.  HENT:     Head: Normocephalic and atraumatic.     Right Ear: External ear normal.     Left Ear: External ear normal.     Nose: No congestion.  Eyes:     Extraocular Movements: Extraocular movements intact.  Cardiovascular:     Rate and Rhythm: Normal rate.     Pulses: Normal pulses.  Pulmonary:     Effort: Pulmonary effort is normal. No respiratory distress.  Abdominal:     General: There is no distension.     Palpations: Abdomen is soft.  Musculoskeletal:  General: No tenderness or signs of injury.     Cervical back: Neck supple.     Right lower leg: No edema.     Left lower leg: No edema.     Comments: Patient has good distal strength without clonus.  Skin:    Findings: No erythema or rash.  Neurological:     General: No focal deficit present.     Mental Status: He is alert and oriented to person, place, and time.     Sensory: No sensory deficit.     Motor: No weakness or abnormal muscle tone.     Coordination: Coordination normal.  Psychiatric:        Mood and Affect: Mood normal.        Behavior: Behavior normal.      Imaging: No results found.

## 2022-10-22 ENCOUNTER — Other Ambulatory Visit: Payer: Self-pay | Admitting: Cardiology

## 2022-10-22 ENCOUNTER — Encounter: Payer: Self-pay | Admitting: Cardiology

## 2022-10-22 ENCOUNTER — Ambulatory Visit: Payer: Medicare HMO | Attending: Cardiology | Admitting: Cardiology

## 2022-10-22 VITALS — BP 170/90 | HR 72 | Ht 70.0 in | Wt 182.8 lb

## 2022-10-22 DIAGNOSIS — I951 Orthostatic hypotension: Secondary | ICD-10-CM

## 2022-10-22 DIAGNOSIS — I495 Sick sinus syndrome: Secondary | ICD-10-CM

## 2022-10-22 DIAGNOSIS — I2510 Atherosclerotic heart disease of native coronary artery without angina pectoris: Secondary | ICD-10-CM | POA: Diagnosis not present

## 2022-10-22 DIAGNOSIS — I4891 Unspecified atrial fibrillation: Secondary | ICD-10-CM | POA: Diagnosis not present

## 2022-10-22 MED ORDER — ROSUVASTATIN CALCIUM 40 MG PO TABS: ORAL_TABLET | ORAL | 1 refills | Status: DC

## 2022-10-22 NOTE — Progress Notes (Signed)
Clinical Summary Mr. Hiney is a 76 y.o.male seen today for follow up of the following medical problems.    1. Tachy/Bradycardia/Afib - ER visit9/2021 with bradycardia. HRs mid 30s in nephrology clinic, sent to ER - has had prior issues with bradycardia, we had been weaning his home beta blocker. At ER visit beta blocker was stopped - no significant symptoms at that time, was discharged from ER with outpatient monitor.  - zio patch with frequent supraventricular ectopy, runs of atach. Rare episodes of sinus brady to 30s in AM hours, unclear if sleeping. Of note he reports no significant dizziness episodes while wearing monitor.        08/2020 event monitor: Min HR 32, Max HR 136, Avg 69. Frequent PACs, short runs of atach. Episode of severe sinus brady at 32 at 7am, unclear if sleeping     - off farxiga stopped recently, dizziness has improved        2. Orthostatic hypotension -at prior clinic visit SBP drops 36 points with standing, DBP 26 points.  - continues to stay well hydyrated   3. CAD  - multiple interventions as described below - no chest pains, no SOB/DOE       4. HTN  - compliant with meds - with history of orthostatic hypotension have tolerated higher bp's  - checks bp at home, 130s-140s/70s - has had some white coat HTN     5 Hyperlipidemia   - was off statin due to LFT elevation workup. He is back on statin, LFTs have normalized.    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 Prostate CA - had radioactive seeds placed, followed by urology.      6. Rheumatoid arthritis - followed by rheum       Past Medical History:  Diagnosis Date   CAD (coronary artery disease)    Acute myocardial infarction treated with TPA in 1990; 1999-stents to circumflex and RCA; residual total occlusion of the left anterior descending; normal ejection fraction; stress nuclear in 2001-distal anteroseptal  ischemia; normal LV function   CKD (chronic kidney disease) stage 4, GFR 15-29 ml/min (HCC)    First degree heart block    History of gastric ulcer    History of kidney stones    History of MI (myocardial infarction)    1990-  treated w/ TPA   Hyperlipidemia    Hypertension    Jaundice    OA (osteoarthritis)    Prediabetes    Prostate cancer (HCC)    Stage T1c , Gleason 3+4,  PSA 4.7,  vol 24cc--  scheduled for radiative seed implants   Psoriatic arthritis (HCC)    RA (rheumatoid arthritis) ([place])    Dr. Toni Amend   Sinus bradycardia    Wears dentures      No Known Allergies   Current Outpatient Medications  Medication Sig Dispense Refill   Alcohol Swabs (DROPSAFE ALCOHOL PREP) 70 % PADS USE AS NEEDED TO CLEAN SKIN PRIOR TO FINGERPRICK AND INJECTION 100 each 5   allopurinol (ZYLOPRIM) 100 MG tablet TAKE 1/2 TABLET EVERY OTHER DAY 45 tablet 0   amLODipine (NORVASC) 5 MG tablet Take 1 tablet (5 mg total) by mouth daily. 90 tablet 3   apixaban (ELIQUIS) 5 MG TABS tablet Take 1 tablet (5 mg total) by mouth 2 (two) times daily. 180 tablet 3   Blood  Glucose Monitoring Suppl (ACCU-CHEK GUIDE) w/Device KIT 1 Units by Does not apply route 2 (two) times daily. 1 kit 3   dapagliflozin propanediol (FARXIGA) 5 MG TABS tablet Take by mouth daily.     Dupilumab (DUPIXENT) 300 MG/2ML SOPN Inject 1 application into the skin every 14 (fourteen) days. 431 mL 4   folic acid (FOLVITE) 540 MCG tablet Take 400 mcg by mouth daily.     glucose blood (ACCU-CHEK GUIDE) test strip Use as instructed 100 each 12   hydrALAZINE (APRESOLINE) 50 MG tablet Take 50 mg by mouth 2 (two) times daily.     Lancets (ACCU-CHEK SOFT TOUCH) lancets Use as instructed 100 each 12   leflunomide (ARAVA) 10 MG tablet Take 1 tablet (10 mg total) by mouth daily. 90 tablet 0   nitroGLYCERIN (NITROSTAT) 0.4 MG SL tablet Place 0.4 mg under the tongue every 5 (five) minutes as needed for chest pain.      omeprazole (PRILOSEC) 40 MG  capsule Take 1 capsule (40 mg total) by mouth daily. 90 capsule 3   triamcinolone cream (KENALOG) 0.1 % APPLY 1 APPLICATION TOPICALLY DAILY AS NEEDED (RASH). (Patient taking differently: Apply 1 application  topically daily.) 454 g 3   Colchicine 0.6 MG CAPS Take 0.6 mg by mouth daily as needed (Gout). (Patient not taking: Reported on 10/22/2022)     losartan (COZAAR) 50 MG tablet Take 50 mg by mouth daily. (Patient not taking: Reported on 10/15/2022)     rosuvastatin (CRESTOR) 40 MG tablet TAKE 1 TABLET EVERY DAY . STOP ATORVASTATIN 90 tablet 1   No current facility-administered medications for this visit.     Past Surgical History:  Procedure Laterality Date   BIOPSY  08/27/2019   Procedure: BIOPSY;  Surgeon: Rogene Houston, MD;  [facility];  Service: Endoscopy;;  gastric   BIOPSY  04/03/2022   Procedure: BIOPSY;  Surgeon: Montez Morita, Quillian Quince, MD;  [facility];  Service: Gastroenterology;;   callus removal Left 09/13/2022   left foot   CARDIOVASCULAR STRESS TEST  2001  per Dr Lattie Haw clinic note   distal anteroseptal ischemia,  normal LVF   COLONOSCOPY  last one 2006 (approx)   COLONOSCOPY WITH PROPOFOL N/A 04/03/2022   Procedure: COLONOSCOPY WITH PROPOFOL;  Surgeon: Harvel Quale, MD;  [facility];  Service: Gastroenterology;  Laterality: N/A;  [place]   DES x2  to CFX and RCA/  residual total occlusion LAD,  normal LVEF   ESOPHAGOGASTRODUODENOSCOPY (EGD) WITH PROPOFOL N/A 08/27/2019   Procedure: ESOPHAGOGASTRODUODENOSCOPY (EGD) WITH PROPOFOL;  Surgeon: Rogene Houston, MD;  [facility];  Service: Endoscopy;  Laterality: N/A;  10:10   ESOPHAGOGASTRODUODENOSCOPY (EGD) WITH PROPOFOL N/A 04/03/2022   Procedure: ESOPHAGOGASTRODUODENOSCOPY (EGD) WITH PROPOFOL;  Surgeon: Harvel Quale, MD;  [facility];  Service: Gastroenterology;  Laterality: N/A;    EYE SURGERY Bilateral    cataract   HOT HEMOSTASIS  04/03/2022   Procedure: HOT HEMOSTASIS (ARGON PLASMA COAGULATION/BICAP);  Surgeon: Montez Morita, Quillian Quince, MD;  [facility];  Service: Gastroenterology;;   POLYPECTOMY  04/03/2022   Procedure: POLYPECTOMY;  Surgeon: Harvel Quale, MD;  [facility];  Service: Gastroenterology;;   POSTERIOR CERVICAL FUSION/FORAMINOTOMY N/A 05/26/2017   Procedure: RIGHT C4-5 FORAMINOTOMY WITH EXCISION OF HERNIATED [place];  Surgeon: Jessy Oto, MD;  Location: [facility];  Service: Orthopedics;  Laterality: N/A;   RADIOACTIVE  SEED IMPLANT N/A 01/11/2016   Procedure: RADIOACTIVE SEED IMPLANT/BRACHYTHERAPY IMPLANT;  Surgeon: Franchot Gallo, MD;  [facility];  Service: Urology;  Laterality: N/A;   61  seeds implanted no seeds founds in bladder     No Known Allergies    Family History  Problem Relation Age of Onset   Heart attack Mother    Coronary artery disease Father    Ulcerative colitis Father    Ulcers Father    Breast cancer Sister    COPD Brother    Pancreatic cancer Brother    Healthy Sister    Diabetes Brother    Cirrhosis Son    Seizures Son      Social History Mr. Coin reports that he quit smoking about 33 years ago. His smoking use included cigarettes. He has a 30.00 pack-year smoking history. He has never been exposed to tobacco smoke. He has never used smokeless tobacco. Mr. Pritt reports current alcohol use.   Review of Systems CONSTITUTIONAL: No weight loss, fever, chills, weakness or fatigue.  HEENT: Eyes: No visual loss, blurred vision, double vision or yellow sclerae.No hearing loss, sneezing, congestion, runny nose or sore throat.  SKIN: No rash or itching.  CARDIOVASCULAR: per hpi RESPIRATORY: No shortness of breath, cough or sputum.  GASTROINTESTINAL: No anorexia, nausea, vomiting or diarrhea. No abdominal pain or blood.  GENITOURINARY: No burning on  urination, no polyuria NEUROLOGICAL: No headache, dizziness, syncope, paralysis, ataxia, numbness or tingling in the extremities. No change in bowel or bladder control.  MUSCULOSKELETAL: No muscle, back pain, joint pain or stiffness.  LYMPHATICS: No enlarged nodes. No history of splenectomy.  PSYCHIATRIC: No history of depression or anxiety.  ENDOCRINOLOGIC: No reports of sweating, cold or heat intolerance. No polyuria or polydipsia.  Marland Kitchen   Physical Examination Today's Vitals   10/22/22 1035  BP: (!) 186/98  Pulse: 72  SpO2: 98%  Weight: 182 lb 12.8 oz (82.9 kg)  Height: _0  (1.778 m)   Body mass index is 26.23 kg/m.  Gen: resting comfortably, no acute distress HEENT: no scleral icterus, pupils equal round and reactive, no palptable cervical adenopathy,  CV: RRR, no m/r/g no jvd Resp: Clear to auscultation bilaterally GI: abdomen is soft, non-tender, non-distended, normal bowel sounds, no hepatosplenomegaly MSK: extremities are warm, no edema.  Skin: warm, no rash Neuro:  no focal deficits Psych: appropriate affect   Diagnostic Studies  08/2020 event monitor 7 day zio patch Min HR 32, Max HR 136, Avg HR 69 Frequent supraventricular ectopy in the form of isolated PACs (14%), couplets (2.6%), triplets (1.8%).Short runs of atrial tachycardia Rare ventricular ectopy in the form of isolated PVCs and couplets Episode of severe sinus bradycardia to 32 at 7AM, unclear if sleeping. No symptoms reported    06/2022 monitor 13 day monitor Rare supraventricular ectopy in the form of isolated PACs, couplets, triplets. 25 runs of SVT longest lasting 6 min 55 sec. Rare ventricular ectopy in the form of isolated PVCs, couplets Reported symptoms correlated with sinus rhythm, PVCs, PACs 1.8 sec sinus pause while sleeping   08/2022 monitor  NSR with sinus brady and sinus tachy Occaisional PAC's and PVC's No VT or SVT or atrial fib No prolonged pauses Noise artifact is present and  limits interpretation  Assessment and Plan    1. Tachycardia/bradycardia/Afib/acquired thrombophilia -new diagnosis of afib made in 11/2021 - EKG today shows he is back in SR - has not required av nodal agents - recent benign monitors, his  prior dizziness has improved off farxiga, appears may have been the issue   2. Orthostatic hypotension - no recent symptoms, continues to stay well hydrated.      3. CAD - no symptoms, continue current meds   4. HTN -accepting high bp's for him given orthosatic hypotension -= elevated today but has had some white coath HTN, home numbers at goal - continue current meds   5. Hyperlipidemia -at goal, continue current meds     F/u 6 months      Arnoldo Lenis, M.D.

## 2022-10-22 NOTE — Patient Instructions (Signed)

## 2022-10-24 ENCOUNTER — Ambulatory Visit: Payer: Medicare HMO | Admitting: Dermatology

## 2022-10-31 ENCOUNTER — Ambulatory Visit: Payer: Medicare HMO | Admitting: Dermatology

## 2022-10-31 DIAGNOSIS — L720 Epidermal cyst: Secondary | ICD-10-CM

## 2022-10-31 DIAGNOSIS — Z79899 Other long term (current) drug therapy: Secondary | ICD-10-CM | POA: Diagnosis not present

## 2022-10-31 DIAGNOSIS — Z1283 Encounter for screening for malignant neoplasm of skin: Secondary | ICD-10-CM | POA: Diagnosis not present

## 2022-10-31 DIAGNOSIS — L821 Other seborrheic keratosis: Secondary | ICD-10-CM

## 2022-10-31 DIAGNOSIS — L814 Other melanin hyperpigmentation: Secondary | ICD-10-CM | POA: Diagnosis not present

## 2022-10-31 DIAGNOSIS — L578 Other skin changes due to chronic exposure to nonionizing radiation: Secondary | ICD-10-CM

## 2022-10-31 DIAGNOSIS — D229 Melanocytic nevi, unspecified: Secondary | ICD-10-CM | POA: Diagnosis not present

## 2022-10-31 DIAGNOSIS — L2081 Atopic neurodermatitis: Secondary | ICD-10-CM

## 2022-10-31 MED ORDER — DUPIXENT 300 MG/2ML ~~LOC~~ SOAJ: 300.0000 mg | SUBCUTANEOUS | 6 refills | Status: DC

## 2022-10-31 NOTE — Progress Notes (Signed)
New Patient Visit  Subjective  Jacob Farmer is a 76 y.o. male who presents for the following: Dermatitis (Pt here to continue Dupixent injections for dermatitis on his upper body, patient was prescribed Dupixent injections ~2 years ago by his previous dermatologist, patient report his skin is no longer itchy since starting the [place] injections./No side effects, no conjuctivitits, ) and Annual Exam. The patient presents for Upper Body Skin Exam (UBSE) for skin cancer screening and mole check.  The patient has spots, moles and lesions to be evaluated, some may be new or changing and the patient has concerns that these could be cancer.   Wife with patient   The following portions of the chart were reviewed this encounter and updated as appropriate:   Tobacco  Allergies  Meds  Problems  Med Hx  Surg Hx  Fam Hx     Review of Systems:  No other skin or systemic complaints except as noted in HPI or Assessment and Plan.  Objective  Well appearing patient in no apparent distress; mood and affect are within normal limits.  A focused examination was performed including face,chest,back. Relevant physical exam findings are noted in the Assessment and Plan. Skin waist up examined.  Neck - Posterior Skin mainly clear  right forehead Smooth white papule(s).    Assessment & Plan  Atopic neurodermatitis Neck - Posterior Atopic dermatitis improved on current regimen Dupixent injection every 2 week   Atopic dermatitis - Severe, on Dupixent (biologic medication).  Atopic dermatitis (eczema) is a chronic, relapsing, pruritic condition that can significantly affect quality of life. It is often associated with allergic rhinitis and/or asthma and can require treatment with topical medications, phototherapy, or in severe cases a biologic medication called Dupixent, which requires long term medication management.    Continue Dupixent injection every 2 weeks   Related Medications Dupilumab  (DUPIXENT) 300 MG/2ML SOPN Inject 300 mg into the skin every 14 (fourteen) days. Starting at day 15 for maintenance.  [place] right forehead Benign-appearing.  Observation.  Call clinic for new or changing lesions.  Recommend daily use of broad spectrum spf 30+ sunscreen to sun-exposed areas.    Lentigines - Scattered tan macules - Due to sun exposure - Benign-appearing, observe - Recommend daily broad spectrum sunscreen SPF 30+ to sun-exposed areas, reapply every 2 hours as needed. - Call for any changes  Seborrheic Keratoses - Stuck-on, waxy, tan-brown papules and/or plaques  - Benign-appearing - Discussed benign etiology and prognosis. - Observe - Call for any changes  Melanocytic Nevi - Tan-brown and/or pink-flesh-colored symmetric macules and papules - Benign appearing on exam today - Observation - Call clinic for new or changing moles - Recommend daily use of broad spectrum spf 30+ sunscreen to sun-exposed areas.   Hemangiomas - Red papules - Discussed benign nature - Observe - Call for any changes  Actinic Damage - Chronic condition, secondary to cumulative UV/sun exposure - diffuse scaly erythematous macules with underlying dyspigmentation - Recommend daily broad spectrum sunscreen SPF 30+ to sun-exposed areas, reapply every 2 hours as needed.  - Staying in the shade or wearing long sleeves, sun glasses (UVA+UVB protection) and wide brim hats (4-inch brim around the entire circumference of the hat) are also recommended for sun protection.  - Call for new or changing lesions.  Skin cancer screening performed today.   Return in about 6 months (around 05/01/2023) for Atopic dermatitis .  IMarye Round, CMA, am acting as scribe for Sarina Ser, MD .  Documentation: I have reviewed the above documentation for accuracy and completeness, and I agree with the above.  Sarina Ser, MD

## 2022-10-31 NOTE — Patient Instructions (Addendum)
Gentle Skin Care Guide  1. Bathe no more than once a day.  2. Avoid bathing in hot water  3. Use a mild soap like Dove, Vanicream, Cetaphil, CeraVe. Can use Lever 2000 or Cetaphil antibacterial soap  4. Use soap only where you need it. On most days, use it under your arms, between your legs, and on your feet. Let the water rinse other areas unless visibly dirty.  5. When you get out of the bath/shower, use a towel to gently blot your skin dry, don't rub it.  6. While your skin is still a little damp, apply a moisturizing cream such as CeraVe,  Cream  7. Reapply moisturizer any time you start to itch or feel dry.  8. Sometimes using free and clear laundry detergents can be helpful. Fabric softener sheets should be avoided. Downy Free & Gentle liquid, or any liquid fabric softener that is free of dyes and perfumes, it acceptable to use  9. If your doctor has given you prescription creams you may apply moisturizers over them        Due to recent changes in healthcare laws, you may see results of your pathology and/or laboratory studies on MyChart before the doctors have had a chance to review them. We understand that in some cases there may be results that are confusing or concerning to you. Please understand that not all results are received at the same time and often the doctors may need to interpret multiple results in order to provide you with the best plan of care or course of treatment. Therefore, we ask that you please give [place] 2 business days to thoroughly review all your results before contacting the office for clarification. Should we see a critical lab result, you will be contacted sooner.   If You Need Anything After Your Visit  If you have any questions or concerns for your doctor, please call our main line at [phone] and press option 4 to reach your doctor's medical assistant. If no one answers, please leave a voicemail as directed and we will return your call as soon as  possible. Messages left after 4 pm will be answered the following business day.   You may also send [place] a message via [place]. We typically respond to MyChart messages within 1-2 business days.  For prescription refills, please ask your pharmacy to contact our office. Our fax number is [phone].  If you have an urgent issue when the clinic is closed that cannot wait until the next business day, you can page your doctor at the number below.    Please note that while we do our best to be available for urgent issues outside of office hours, we are not available 24/7.   If you have an urgent issue and are unable to reach [place], you may choose to seek medical care at your doctor's office, retail clinic, urgent care center, or emergency room.  If you have a medical emergency, please immediately call 911 or go to the emergency department.  Pager Numbers  - Dr. Nehemiah Massed: [phone]  - Dr. Laurence Ferrari: [phone]  - Dr. Nicole Kindred: [phone]  In the event of inclement weather, please call our main line at [phone] for an update on the status of any delays or closures.  Dermatology Medication Tips: Please keep the boxes that topical medications come in in order to help keep track of the instructions about where and how to use these. Pharmacies typically print the medication instructions only on the boxes  and not directly on the medication tubes.   If your medication is too expensive, please contact our office at [phone] option 4 or send [place] a message through [place].   We are unable to tell what your co-pay for medications will be in advance as this is different depending on your insurance coverage. However, we may be able to find a substitute medication at lower cost or fill out paperwork to get insurance to cover a needed medication.   If a prior authorization is required to get your medication covered by your insurance company, please allow [place] 1-2 business days to complete this  process.  Drug prices often vary depending on where the prescription is filled and some pharmacies may offer cheaper prices.  The website www.goodrx.com contains coupons for medications through different pharmacies. The prices here do not account for what the cost may be with help from insurance (it may be cheaper with your insurance), but the website can give you the price if you did not use any insurance.  - You can print the associated coupon and take it with your prescription to the pharmacy.  - You may also stop by our office during regular business hours and pick up a GoodRx coupon card.  - If you need your prescription sent electronically to a different pharmacy, notify our office through [facility] or by phone at [phone] option 4.     Si Usted Necesita Algo Despus de Su Visita  Tambin puede enviarnos un mensaje a travs de Pharmacist, community. Por lo general respondemos a los mensajes de MyChart en el transcurso de 1 a 2 das hbiles.  Para renovar recetas, por favor pida a su farmacia que se ponga en contacto con nuestra oficina. Harland Dingwall de fax es [place] [phone].  Si tiene un asunto urgente cuando la clnica est cerrada y que no puede esperar hasta el siguiente da hbil, puede llamar/localizar a su doctor(a) al nmero que aparece a continuacin.   Por favor, tenga en cuenta que aunque hacemos todo lo posible para estar disponibles para asuntos urgentes fuera del horario de [place], no estamos disponibles las 24 horas del da, los 7 das de la [place].   Si tiene un problema urgente y no puede comunicarse con nosotros, puede optar por buscar atencin mdica  en el consultorio de su doctor(a), en una clnica privada, en un centro de atencin urgente o en una sala de emergencias.  Si tiene Engineering geologist, por favor llame inmediatamente al 911 o vaya a la sala de emergencias.  Nmeros de bper  - Dr. Nehemiah Massed: [phone]  - Dra. Moye: [phone]  - Dra.  Nicole Kindred: [phone]  En caso de inclemencias del [place], por favor llame a Johnsie Kindred principal al [phone] para una actualizacin sobre el [place] de cualquier retraso o cierre.  Consejos para la medicacin en dermatologa: Por favor, guarde las cajas en las que vienen los medicamentos de uso tpico para ayudarle a seguir las instrucciones sobre dnde y cmo usarlos. Las farmacias generalmente imprimen las instrucciones del medicamento slo en las cajas y no directamente en los tubos del [place].   Si su medicamento es muy caro, por favor, pngase en contacto con Zigmund Daniel llamando al [phone] y presione la opcin 4 o envenos un mensaje a travs de Pharmacist, community.   No podemos decirle cul ser su copago por los medicamentos por adelantado ya que esto es diferente dependiendo de la cobertura de su seguro. Sin embargo, es posible que podamos Pension scheme manager  un medicamento sustituto a Electrical engineer un formulario para que el seguro cubra el medicamento que se considera necesario.   Si se requiere una autorizacin previa para que su compaa de seguros [place] su medicamento, por favor permtanos de 1 a 2 das hbiles para completar este proceso.  Los precios de los medicamentos varan con frecuencia dependiendo del Environmental consultant de dnde se surte la receta y alguna farmacias pueden ofrecer precios ms baratos.  El sitio web www.goodrx.com tiene cupones para medicamentos de Airline pilot. Los precios aqu no tienen en cuenta lo que podra costar con la ayuda del seguro (puede ser ms barato con su seguro), pero el sitio web puede darle el precio si no utiliz Research scientist (physical sciences).  - Puede imprimir el cupn correspondiente y llevarlo con su receta a la farmacia.  - Tambin puede pasar por nuestra oficina durante el horario de atencin regular y Charity fundraiser una tarjeta de cupones de GoodRx.  - Si necesita que su receta se enve electrnicamente a una farmacia diferente, informe a nuestra oficina a  travs de MyChart de [place] o por telfono llamando al [phone] y presione la opcin 4.

## 2022-11-01 ENCOUNTER — Ambulatory Visit: Payer: Medicare HMO | Admitting: Podiatry

## 2022-11-01 DIAGNOSIS — L97522 Non-pressure chronic ulcer of other part of left foot with fat layer exposed: Secondary | ICD-10-CM | POA: Diagnosis not present

## 2022-11-01 NOTE — Progress Notes (Signed)
  Subjective:  Patient ID: Jacob Farmer, male    DOB: 11/15/1946,  MRN: [id number]  Chief Complaint  Patient presents with   Callouses    76 y.o. male presents for wound care.  Patient Zentz for follow-up of left submetatarsal 5 ulceration.  He states he is doing a lot better is not as achy.  Is been wearing surgical shoe he denies any other acute complaints has been doing Betadine wet-to-dry dressing  Review of Systems: Negative except as noted in the HPI. Denies N/V/F/Ch.  Past Medical History:  Diagnosis Date   CAD (coronary artery disease)    Acute myocardial infarction treated with TPA in 1990; 1999-stents to circumflex and RCA; residual total occlusion of the left anterior descending; normal ejection fraction; stress nuclear in 2001-distal anteroseptal ischemia; normal LV function   CKD (chronic kidney disease) stage 4, GFR 15-29 ml/min (HCC)    First degree heart block    History of gastric ulcer    History of kidney stones    History of MI (myocardial infarction)    1990-  treated w/ TPA   Hyperlipidemia    Hypertension    Jaundice    OA (osteoarthritis)    Prediabetes    Prostate cancer (HCC)    Stage T1c , Gleason 3+4,  PSA 4.7,  vol 24cc--  scheduled for radiative seed implants   Psoriatic arthritis (HCC)    RA (rheumatoid arthritis) (HCC)    Dr. Devewshar   Sinus bradycardia    Wears dentures     Current Outpatient Medications:    Alcohol Swabs (DROPSAFE ALCOHOL PREP) 70 % PADS, USE AS NEEDED TO CLEAN SKIN PRIOR TO FINGERPRICK AND INJECTION, Disp: 100 each, Rfl: 5   allopurinol (ZYLOPRIM) 100 MG tablet, TAKE 1/2 TABLET EVERY OTHER DAY, Disp: 45 tablet, Rfl: 0   amLODipine (NORVASC) 5 MG tablet, TAKE 1 TABLET EVERY DAY, Disp: 90 tablet, Rfl: 3   apixaban (ELIQUIS) 5 MG TABS tablet, Take 1 tablet (5 mg total) by mouth 2 (two) times daily., Disp: 180 tablet, Rfl: 3   Blood Glucose Monitoring Suppl (ACCU-CHEK GUIDE) w/Device KIT, 1 Units by Does not apply route 2  (two) times daily., Disp: 1 kit, Rfl: 3   Colchicine 0.6 MG CAPS, Take 0.6 mg by mouth daily as needed (Gout). (Patient not taking: Reported on 10/22/2022), Disp: , Rfl:    dapagliflozin propanediol (FARXIGA) 5 MG TABS tablet, Take by mouth daily., Disp: , Rfl:    Dupilumab (DUPIXENT) 300 MG/2ML SOPN, Inject 1 application into the skin every 14 (fourteen) days., Disp: 300 mL, Rfl: 4   Dupilumab (DUPIXENT) 300 MG/2ML SOPN, Inject 300 mg into the skin every 14 (fourteen) days. Starting at day 15 for maintenance., Disp: 4 mL, Rfl: 6   folic acid (FOLVITE) 400 MCG tablet, Take 400 mcg by mouth daily., Disp: , Rfl:    glucose blood (ACCU-CHEK GUIDE) test strip, Use as instructed, Disp: 100 each, Rfl: 12   hydrALAZINE (APRESOLINE) 50 MG tablet, Take 50 mg by mouth 2 (two) times daily., Disp: , Rfl:    Lancets (ACCU-CHEK SOFT TOUCH) lancets, Use as instructed, Disp: 100 each, Rfl: 12   leflunomide (ARAVA) 10 MG tablet, Take 1 tablet (10 mg total) by mouth daily., Disp: 90 tablet, Rfl: 0   losartan (COZAAR) 50 MG tablet, Take 50 mg by mouth daily. (Patient not taking: Reported on 10/15/2022), Disp: , Rfl:    nitroGLYCERIN (NITROSTAT) 0.4 MG SL tablet, Place 0.4 mg under   the tongue every 5 (five) minutes as needed for chest pain. , Disp: , Rfl:    omeprazole (PRILOSEC) 40 MG capsule, Take 1 capsule (40 mg total) by mouth daily., Disp: 90 capsule, Rfl: 3   rosuvastatin (CRESTOR) 40 MG tablet, TAKE 1 TABLET EVERY DAY . STOP ATORVASTATIN, Disp: 90 tablet, Rfl: 1   triamcinolone cream (KENALOG) 0.1 %, APPLY 1 APPLICATION TOPICALLY DAILY AS NEEDED (RASH). (Patient taking differently: Apply 1 application  topically daily.), Disp: 454 g, Rfl: 3  Social History   Tobacco Use  Smoking Status Former   Packs/day: 1.00   Years: 30.00   Total pack years: 30.00   Types: Cigarettes   Quit date: 05/21/1989   Years since quitting: 33.4   Passive exposure: Never  Smokeless Tobacco Never    No Known  Allergies Objective:  There were no vitals filed for this visit. There is no height or weight on file to calculate BMI. Constitutional Well developed. Well nourished.  Vascular Dorsalis pedis pulses palpable bilaterally. Posterior tibial pulses palpable bilaterally. Capillary refill normal to all digits.  No cyanosis or clubbing noted. Pedal hair growth normal.  Neurologic Normal speech. Oriented to person, place, and time. Protective sensation absent  Dermatologic Skin completely epithelialized.  No signs of wound noted.  No other complication noted.  No infections noted.  Orthopedic: No pain to palpation either foot.   Radiographs: 3 views of skeletally mature adult left foot: No signs of osteomyelitis noted.  No breakdown of bone noted.  Slight osteopenia noted.  No other bony abnormalities identified. Assessment:   No diagnosis found.   Plan:  Patient was evaluated and treated and all questions answered.  Ulcer left submetatarsal 5 ulceration with fat layer exposed with underlying plantarflexed fifth metatarsal -Clinically healed.  The skin has completely epithelialized.  I discussed floating osteotomy briefly.  He will think about it and get back to me after holiday.  If any foot and ankle issues on future he will come back and see me.  No follow-ups on file. 

## 2022-11-05 ENCOUNTER — Telehealth: Payer: Self-pay | Admitting: *Deleted

## 2022-11-05 NOTE — Telephone Encounter (Signed)
Patient assistance forms faxed for Eliquis

## 2022-11-05 NOTE — Telephone Encounter (Signed)
Patient's wife is following up requesting to speak directly with Karen Kays, LPN regarding patient assistance for Eliquis.

## 2022-11-05 NOTE — Telephone Encounter (Signed)
Spoke with wife who states that she spoke with [employer]. Pt's out of pocket cost this year was $70. Mcarthur Rossetti is unable to provide a fax stating this.

## 2022-11-11 DIAGNOSIS — I5032 Chronic diastolic (congestive) heart failure: Secondary | ICD-10-CM | POA: Diagnosis not present

## 2022-11-11 DIAGNOSIS — R809 Proteinuria, unspecified: Secondary | ICD-10-CM | POA: Diagnosis not present

## 2022-11-11 DIAGNOSIS — N189 Chronic kidney disease, unspecified: Secondary | ICD-10-CM | POA: Diagnosis not present

## 2022-11-11 DIAGNOSIS — I129 Hypertensive chronic kidney disease with stage 1 through stage 4 chronic kidney disease, or unspecified chronic kidney disease: Secondary | ICD-10-CM | POA: Diagnosis not present

## 2022-11-11 DIAGNOSIS — N184 Chronic kidney disease, stage 4 (severe): Secondary | ICD-10-CM | POA: Diagnosis not present

## 2022-11-11 DIAGNOSIS — E211 Secondary hyperparathyroidism, not elsewhere classified: Secondary | ICD-10-CM | POA: Diagnosis not present

## 2022-11-11 DIAGNOSIS — N170 Acute kidney failure with tubular necrosis: Secondary | ICD-10-CM | POA: Diagnosis not present

## 2022-11-11 DIAGNOSIS — D631 Anemia in chronic kidney disease: Secondary | ICD-10-CM | POA: Diagnosis not present

## 2022-11-19 ENCOUNTER — Encounter: Payer: Self-pay | Admitting: Dermatology

## 2022-11-28 DIAGNOSIS — I129 Hypertensive chronic kidney disease with stage 1 through stage 4 chronic kidney disease, or unspecified chronic kidney disease: Secondary | ICD-10-CM | POA: Diagnosis not present

## 2022-11-28 DIAGNOSIS — N184 Chronic kidney disease, stage 4 (severe): Secondary | ICD-10-CM | POA: Diagnosis not present

## 2022-11-28 DIAGNOSIS — R809 Proteinuria, unspecified: Secondary | ICD-10-CM | POA: Diagnosis not present

## 2022-11-28 DIAGNOSIS — N189 Chronic kidney disease, unspecified: Secondary | ICD-10-CM | POA: Diagnosis not present

## 2022-11-28 DIAGNOSIS — D631 Anemia in chronic kidney disease: Secondary | ICD-10-CM | POA: Diagnosis not present

## 2022-11-28 DIAGNOSIS — I5032 Chronic diastolic (congestive) heart failure: Secondary | ICD-10-CM | POA: Diagnosis not present

## 2022-12-20 DIAGNOSIS — H903 Sensorineural hearing loss, bilateral: Secondary | ICD-10-CM | POA: Diagnosis not present

## 2022-12-24 ENCOUNTER — Ambulatory Visit: Payer: Medicare HMO

## 2022-12-25 ENCOUNTER — Telehealth: Payer: Self-pay | Admitting: Internal Medicine

## 2022-12-25 ENCOUNTER — Ambulatory Visit (INDEPENDENT_AMBULATORY_CARE_PROVIDER_SITE_OTHER): Payer: Medicare HMO | Admitting: Internal Medicine

## 2022-12-25 ENCOUNTER — Encounter: Payer: Self-pay | Admitting: Internal Medicine

## 2022-12-25 VITALS — BP 158/72 | HR 83 | Ht 70.0 in | Wt 186.8 lb

## 2022-12-25 DIAGNOSIS — I2510 Atherosclerotic heart disease of native coronary artery without angina pectoris: Secondary | ICD-10-CM

## 2022-12-25 DIAGNOSIS — I10 Essential (primary) hypertension: Secondary | ICD-10-CM

## 2022-12-25 DIAGNOSIS — N184 Chronic kidney disease, stage 4 (severe): Secondary | ICD-10-CM | POA: Diagnosis not present

## 2022-12-25 DIAGNOSIS — E1122 Type 2 diabetes mellitus with diabetic chronic kidney disease: Secondary | ICD-10-CM

## 2022-12-25 DIAGNOSIS — I480 Paroxysmal atrial fibrillation: Secondary | ICD-10-CM

## 2022-12-25 DIAGNOSIS — I5032 Chronic diastolic (congestive) heart failure: Secondary | ICD-10-CM

## 2022-12-25 DIAGNOSIS — M0600 Rheumatoid arthritis without rheumatoid factor, unspecified site: Secondary | ICD-10-CM | POA: Diagnosis not present

## 2022-12-25 DIAGNOSIS — H9221 Otorrhagia, right ear: Secondary | ICD-10-CM | POA: Diagnosis not present

## 2022-12-25 MED ORDER — AMLODIPINE BESYLATE 10 MG PO TABS: 10.0000 mg | ORAL_TABLET | Freq: Every day | ORAL | 3 refills | Status: DC

## 2022-12-25 NOTE — Telephone Encounter (Signed)
Pt wife called stating his ear is bleeding and wants to be seen here today. Is there anyway to work him in?

## 2022-12-25 NOTE — Progress Notes (Signed)
Established Patient Office Visit  Subjective:  Patient ID: Jacob Farmer, male    DOB: 1946-04-20  Age: 77 y.o. MRN: [id number]  CC:  Chief Complaint  Patient presents with   Ear Bleed    Patient is bleeding in Right ear, started at 1030am     HPI NARVEL KOZUB is a 77 y.o. male with past medical history of CAD status post stent placement, HTN, HLD, RA, CKD stage IV, prostate ca. s/p radiotherapy, gout, cervical spinal stenosis and GERD who presents for c/o right ear bleeding since 10:30 AM today.  He started having right ear bleeding while watching TV today.  He has history of hearing loss and wears hearing aid.  He is unsure if he scratched his ear or bleeding happened right after wearing hearing aid.  He denies any pain or pus discharge.  Denies any fever or chills.  CAD s/p stent placement and HTN: His blood pressure was elevated today.  Of note, his losartan has been discontinued by nephrologist.  He reports high BP readings at home as well.  He is currently taking amlodipine 5 mg daily and hydralazine 100 mg twice daily.  He denies any chest pain, dyspnea or palpitations. He follows up with cardiology.  He is on aspirin and statin.  He takes Eliquis for history of A-fib. He has been having episodes of dizziness, and was referred to EP cardiology.  He had loop recorder for evaluation of bradycardia, which was unremarkable.   RA: He is on [employer] for it. He goes to rheumatology clinic.  He also takes allopurinol for history of gout, denies any recent flareups.   CKD D stage IV: He is on losartan. He follows up with nephrology for it.  Denies any dysuria or hematuria currently.    Past Medical History:  Diagnosis Date   CAD (coronary artery disease)    Acute myocardial infarction treated with TPA in 1990; 1999-stents to circumflex and RCA; residual total occlusion of the left anterior descending; normal ejection fraction; stress nuclear in 2001-distal anteroseptal ischemia; normal LV  function   CKD (chronic kidney disease) stage 4, GFR 15-29 ml/min (HCC)    First degree heart block    History of gastric ulcer    History of kidney stones    History of MI (myocardial infarction)    1990-  treated w/ TPA   Hyperlipidemia    Hypertension    Jaundice    OA (osteoarthritis)    Prediabetes    Prostate cancer ([place])    Stage T1c , Gleason 3+4,  PSA 4.7,  vol 24cc--  scheduled for radiative seed implants   Psoriatic arthritis ([place])    RA (rheumatoid arthritis) ([place])    Dr. Toni Amend   Sinus bradycardia    Wears dentures     Past Surgical History:  Procedure Laterality Date   BIOPSY  08/27/2019   Procedure: BIOPSY;  Surgeon: Rogene Houston, MD;  [facility];  Service: Endoscopy;;  gastric   BIOPSY  04/03/2022   Procedure: BIOPSY;  Surgeon: Montez Morita, Quillian Quince, MD;  [facility];  Service: Gastroenterology;;   callus removal Left 09/13/2022   left foot   CARDIOVASCULAR STRESS TEST  2001  per Dr Lattie Haw clinic note   distal anteroseptal ischemia,  normal LVF   COLONOSCOPY  last one 2006 (approx)   COLONOSCOPY WITH PROPOFOL N/A 04/03/2022   Procedure: COLONOSCOPY WITH PROPOFOL;  Surgeon: Harvel Quale, MD;  [facility];  Service: Gastroenterology;  Laterality: N/A;  [place]   DES x2  to CFX and RCA/  residual total occlusion LAD,  normal LVEF   ESOPHAGOGASTRODUODENOSCOPY (EGD) WITH PROPOFOL N/A 08/27/2019   Procedure: ESOPHAGOGASTRODUODENOSCOPY (EGD) WITH PROPOFOL;  Surgeon: Rogene Houston, MD;  [facility];  Service: Endoscopy;  Laterality: N/A;  10:10   ESOPHAGOGASTRODUODENOSCOPY (EGD) WITH PROPOFOL N/A 04/03/2022   Procedure: ESOPHAGOGASTRODUODENOSCOPY (EGD) WITH PROPOFOL;  Surgeon: Harvel Quale, MD;  [facility];  Service: Gastroenterology;  Laterality: N/A;   EYE SURGERY Bilateral    cataract   HOT HEMOSTASIS  04/03/2022    Procedure: HOT HEMOSTASIS (ARGON PLASMA COAGULATION/BICAP);  Surgeon: Montez Morita, Quillian Quince, MD;  [facility];  Service: Gastroenterology;;   POLYPECTOMY  04/03/2022   Procedure: POLYPECTOMY;  Surgeon: Harvel Quale, MD;  [facility];  Service: Gastroenterology;;   POSTERIOR CERVICAL FUSION/FORAMINOTOMY N/A 05/26/2017   Procedure: RIGHT C4-5 FORAMINOTOMY WITH EXCISION OF HERNIATED [place];  Surgeon: Jessy Oto, MD;  Location: [facility];  Service: Orthopedics;  Laterality: N/A;   RADIOACTIVE SEED IMPLANT N/A 01/11/2016   Procedure: RADIOACTIVE SEED IMPLANT/BRACHYTHERAPY IMPLANT;  Surgeon: Franchot Gallo, MD;  Location: [facility];  Service: Urology;  Laterality: N/A;   37  seeds implanted no seeds founds in bladder    Family History  Problem Relation Age of Onset   Heart attack Mother    Coronary artery disease Father    Ulcerative colitis Father    Ulcers Father    Breast cancer Sister    COPD Brother    Pancreatic cancer Brother    Healthy Sister    Diabetes Brother    Cirrhosis Son    Seizures Son     Social History   Socioeconomic History   Marital status: Married    Spouse name: Not on file   Number of children: 2   Years of education: Not on file   Highest education level: Not on file  Occupational History   Occupation: Retired Clinical biochemist  Tobacco Use   Smoking status: Former    Packs/day: 1.00    Years: 30.00    Total pack years: 30.00    Types: Cigarettes    Quit date: 05/21/1989    Years since quitting: 33.6    Passive exposure: Never   Smokeless tobacco: Never  Vaping Use   Vaping Use: Never used  Substance and Sexual Activity   Alcohol use: Yes    Comment: 1 beer and 1 mix drink per day   Drug use: No   Sexual activity: Not on file  Other Topics Concern   Not on file  Social History Narrative   Married for 24 years.Retired Clinical biochemist.   Social Determinants of Health   Financial Resource  Strain: Low Risk  (09/14/2021)   Overall Financial Resource Strain (CARDIA)    Difficulty of Paying Living Expenses: Not hard at all  Food Insecurity: No Food Insecurity (09/14/2021)   Hunger Vital Sign    Worried About Running Out of Food in the Last Year: Never true    Ran Out of Food in the Last Year: Never true  Transportation Needs: No Transportation Needs (09/14/2021)   PRAPARE - Hydrologist (Medical): No    Lack of Transportation (Non-Medical): No  Physical Activity: Sufficiently Active (09/14/2021)   Exercise Vital Sign    Days of Exercise per  Week: 7 days    Minutes of Exercise per Session: 60 min  Stress: No Stress Concern Present (09/14/2021)   [place]    Feeling of Stress : Not at all  Social Connections: Moderately Integrated (09/14/2021)   Social Connection and Isolation Panel [NHANES]    Frequency of Communication with Friends and Family: More than three times a week    Frequency of Social Gatherings with Friends and Family: More than three times a week    Attends Religious Services: More than 4 times per year    Active Member of [employer] or Organizations: No    Attends Archivist Meetings: Never    Marital Status: Married  Human resources officer Violence: Not At Risk (09/14/2021)   Humiliation, Afraid, Rape, and Kick questionnaire    Fear of Current or Ex-Partner: No    Emotionally Abused: No    Physically Abused: No    Sexually Abused: No    Outpatient Medications Prior to Visit  Medication Sig Dispense Refill   Alcohol Swabs (DROPSAFE ALCOHOL PREP) 70 % PADS USE AS NEEDED TO CLEAN SKIN PRIOR TO FINGERPRICK AND INJECTION 100 each 5   allopurinol (ZYLOPRIM) 100 MG tablet TAKE 1/2 TABLET EVERY OTHER DAY 45 tablet 0   apixaban (ELIQUIS) 5 MG TABS tablet Take 1 tablet (5 mg total) by mouth 2 (two) times daily. 180 tablet 3   Blood Glucose Monitoring Suppl (ACCU-CHEK GUIDE)  w/Device KIT 1 Units by Does not apply route 2 (two) times daily. 1 kit 3   Colchicine 0.6 MG CAPS Take 0.6 mg by mouth daily as needed (Gout). (Patient not taking: Reported on 10/22/2022)     dapagliflozin propanediol (FARXIGA) 5 MG TABS tablet Take by mouth daily.     Dupilumab (DUPIXENT) 300 MG/2ML SOPN Inject 1 application into the skin every 14 (fourteen) days. 300 mL 4   Dupilumab (DUPIXENT) 300 MG/2ML SOPN Inject 300 mg into the skin every 14 (fourteen) days. Starting at day 15 for maintenance. 4 mL 6   folic acid (FOLVITE) 703 MCG tablet Take 400 mcg by mouth daily.     glucose blood (ACCU-CHEK GUIDE) test strip Use as instructed 100 each 12   hydrALAZINE (APRESOLINE) 50 MG tablet Take 100 mg by mouth 3 (three) times daily.     Lancets (ACCU-CHEK SOFT TOUCH) lancets Use as instructed 100 each 12   leflunomide (ARAVA) 10 MG tablet Take 1 tablet (10 mg total) by mouth daily. 90 tablet 0   nitroGLYCERIN (NITROSTAT) 0.4 MG SL tablet Place 0.4 mg under the tongue every 5 (five) minutes as needed for chest pain.      omeprazole (PRILOSEC) 40 MG capsule Take 1 capsule (40 mg total) by mouth daily. 90 capsule 3   rosuvastatin (CRESTOR) 40 MG tablet TAKE 1 TABLET EVERY DAY . STOP ATORVASTATIN 90 tablet 1   triamcinolone cream (KENALOG) 0.1 % APPLY 1 APPLICATION TOPICALLY DAILY AS NEEDED (RASH). (Patient taking differently: Apply 1 application  topically daily.) 454 g 3   amLODipine (NORVASC) 5 MG tablet TAKE 1 TABLET EVERY DAY 90 tablet 3   losartan (COZAAR) 50 MG tablet Take 50 mg by mouth daily. (Patient not taking: Reported on 10/15/2022)     No facility-administered medications prior to visit.    No Known Allergies  ROS Review of Systems  Constitutional:  Negative for chills and fever.  HENT:  Positive for hearing loss. Negative for congestion and sore throat.  Right ear bleeding  Eyes:  Negative for pain and discharge.  Respiratory:  Negative for cough and shortness of breath.    Cardiovascular:  Negative for chest pain and palpitations.  Gastrointestinal:  Negative for constipation, diarrhea, nausea and vomiting.  Endocrine: Negative for polydipsia and polyuria.  Genitourinary:  Negative for dysuria and hematuria.  Musculoskeletal:  Positive for arthralgias and back pain. Negative for neck pain and neck stiffness.  Skin:  Positive for rash.  Neurological:  Positive for dizziness. Negative for weakness, numbness and headaches.  Psychiatric/Behavioral:  Negative for agitation and behavioral problems.       Objective:    Physical Exam Vitals reviewed.  Constitutional:      General: He is not in acute distress.    Appearance: He is not diaphoretic.  HENT:     Head: Normocephalic and atraumatic.     Right Ear: No tenderness.     Ears:     Comments: Dried blood in right ear canal    Nose: Nose normal.     Mouth/Throat:     Mouth: Mucous membranes are moist.  Eyes:     General: No scleral icterus.    Extraocular Movements: Extraocular movements intact.  Cardiovascular:     Rate and Rhythm: Normal rate and regular rhythm.     Pulses: Normal pulses.     Heart sounds: Normal heart sounds. No murmur heard. Pulmonary:     Breath sounds: Normal breath sounds. No wheezing or rales.  Musculoskeletal:     Cervical back: Neck supple. No tenderness.     Right lower leg: No edema.     Left lower leg: No edema.  Skin:    General: Skin is warm.     Findings: Rash (Diffuse eczematous and dry, scaly patches over LE) present.  Neurological:     General: No focal deficit present.     Mental Status: He is alert and oriented to person, place, and time.     Cranial Nerves: No cranial nerve deficit.     Sensory: No sensory deficit.     Motor: No weakness.  Psychiatric:        Mood and Affect: Mood normal.        Behavior: Behavior normal.     BP (!) 158/72 (BP Location: Right Arm, Cuff Size: Normal)   Pulse 83   Ht '5\' 10"'$  (1.778 m)   Wt 186 lb 12.8 oz (84.7 kg)    SpO2 97%   BMI 26.80 kg/m  Wt Readings from Last 3 Encounters:  12/25/22 186 lb 12.8 oz (84.7 kg)  10/22/22 182 lb 12.8 oz (82.9 kg)  09/20/22 182 lb 12.8 oz (82.9 kg)    Lab Results  Component Value Date   TSH 2.52 08/08/2020   Lab Results  Component Value Date   WBC 6.2 08/28/2022   HGB 11.0 (L) 08/28/2022   HCT 34.6 (L) 08/28/2022   MCV 86.9 08/28/2022   PLT 165 08/28/2022   Lab Results  Component Value Date   NA 141 08/28/2022   K 5.3 08/28/2022   CO2 22 08/28/2022   GLUCOSE 86 08/28/2022   BUN 34 (H) 08/28/2022   CREATININE 3.08 (H) 08/28/2022   BILITOT 0.3 08/28/2022   ALKPHOS 254 (H) 11/06/2020   AST 34 08/28/2022   ALT 38 08/28/2022   PROT 6.1 08/28/2022   ALBUMIN 2.9 (L) 11/06/2020   CALCIUM 8.0 (L) 08/28/2022   ANIONGAP 8 11/02/2020   EGFR 20 (L) 08/28/2022  Lab Results  Component Value Date   CHOL 113 06/14/2021   Lab Results  Component Value Date   HDL 49 06/14/2021   Lab Results  Component Value Date   LDLCALC 48 06/14/2021   Lab Results  Component Value Date   TRIG 82 06/14/2021   Lab Results  Component Value Date   CHOLHDL 2.3 06/14/2021   Lab Results  Component Value Date   HGBA1C 5.5 07/22/2022   HGBA1C 5.5 07/22/2022      Assessment & Plan:   Problem List Items Addressed This Visit       Cardiovascular and Mediastinum   Essential hypertension    BP Readings from Last 1 Encounters:  10/22/22 (!) 170/90  Uncontrolled with Amlodipine and hydralazine Losartan has been discontinued by Nephrology Increased dose of Amlodipine to 10 mg QD Counseled for compliance with the medications Advised DASH diet and moderate exercise/walking as tolerated      Relevant Medications   amLODipine (NORVASC) 10 MG tablet   Coronary artery disease involving native coronary artery of native heart without angina pectoris    S/p stent placement in 1999 On aspirin and statin Followed by Cardiology      Relevant Medications    amLODipine (NORVASC) 10 MG tablet   Paroxysmal atrial fibrillation (HCC)    In sinus rhythm currently, rate controlled On Eliquis now Has episodes of dizziness, had loop recorder for evaluation of symptomatic bradycardia      Relevant Medications   amLODipine (NORVASC) 10 MG tablet   Chronic heart failure with preserved ejection fraction (HCC)    Appears euvolemic currently On Farxiga Followed by cardiology      Relevant Medications   amLODipine (NORVASC) 10 MG tablet     Endocrine   Type 2 diabetes mellitus with diabetic chronic kidney disease ([place])    Lab Results  Component Value Date   HGBA1C 5.5 07/22/2022   HGBA1C 5.5 07/22/2022  Diet controlled Advised to follow diabetic diet On statin and ARB F/u CMP and lipid panel Diabetic eye exam: Advised to follow up with Ophthalmology for diabetic eye exam        Nervous and Auditory   Bleeding from right ear - Primary    Unclear etiology, likely due to scratch Had ear irrigation, had blood stained drainage - still has dried blood filling the ear canal Urgent referral to ENT specialist If recurrent bleeding, will have to hold Eliquis Advised to go to ER if recurrent active bleeding Advised to avoid wearing hearing aid for now and avoid sharp objects for cleaning purposes       Relevant Orders   Ambulatory referral to ENT     Musculoskeletal and Integument   Rheumatoid arthritis ([place])    On Arava Followed by Rheumatology        Genitourinary   Chronic kidney disease, stage 4 (severe) (HCC)    Avoid nephrotoxic agents including NSAIDs Followed by nephrology       Meds ordered this encounter  Medications   amLODipine (NORVASC) 10 MG tablet    Sig: Take 1 tablet (10 mg total) by mouth daily.    Dispense:  90 tablet    Refill:  3    Follow-up: Return if symptoms worsen or fail to improve.    Lindell Spar, MD

## 2022-12-25 NOTE — Assessment & Plan Note (Signed)
In sinus rhythm currently, rate controlled On Eliquis now Has episodes of dizziness, had loop recorder for evaluation of symptomatic bradycardia

## 2022-12-25 NOTE — Assessment & Plan Note (Addendum)
Unclear etiology, likely due to scratch Had ear irrigation, had blood stained drainage - still has dried blood filling the ear canal Urgent referral to ENT specialist If recurrent bleeding, will have to hold Eliquis Advised to go to ER if recurrent active bleeding Advised to avoid wearing hearing aid for now and avoid sharp objects for cleaning purposes

## 2022-12-25 NOTE — Assessment & Plan Note (Signed)
S/p stent placement in 1999 On aspirin and statin Followed by Cardiology

## 2022-12-25 NOTE — Patient Instructions (Addendum)
Please avoid using any sharp objects for cleaning. Okay to use cotton for cleaning.  Please do not wear hearing aid until ENT evaluation.  Please start taking Amlodipine 10 mg instead of 5 mg.

## 2022-12-25 NOTE — Assessment & Plan Note (Addendum)
Avoid nephrotoxic agents including NSAIDs Followed by nephrology 

## 2022-12-25 NOTE — Assessment & Plan Note (Addendum)
BP Readings from Last 1 Encounters:  10/22/22 (!) 170/90   Uncontrolled with Amlodipine and hydralazine Losartan has been discontinued by Nephrology Increased dose of Amlodipine to 10 mg QD Counseled for compliance with the medications Advised DASH diet and moderate exercise/walking as tolerated

## 2022-12-25 NOTE — Assessment & Plan Note (Signed)
Appears euvolemic currently On Farxiga Followed by cardiology

## 2022-12-25 NOTE — Assessment & Plan Note (Addendum)
Lab Results  Component Value Date   HGBA1C 5.5 07/22/2022   HGBA1C 5.5 07/22/2022   Diet controlled Advised to follow diabetic diet On statin and ARB F/u CMP and lipid panel Diabetic eye exam: Advised to follow up with Ophthalmology for diabetic eye exam 

## 2022-12-25 NOTE — Assessment & Plan Note (Signed)
On Arava Followed by Rheumatology 

## 2022-12-26 DIAGNOSIS — Z7901 Long term (current) use of anticoagulants: Secondary | ICD-10-CM | POA: Diagnosis not present

## 2022-12-26 DIAGNOSIS — H9113 Presbycusis, bilateral: Secondary | ICD-10-CM | POA: Diagnosis not present

## 2022-12-26 DIAGNOSIS — H9221 Otorrhagia, right ear: Secondary | ICD-10-CM | POA: Diagnosis not present

## 2022-12-26 NOTE — Progress Notes (Signed)
Office Visit Note  Patient: Jacob Farmer             Date of Birth: [birth year]           MRN: [id number]             PCP: Lindell Spar, MD Referring: Lindell Spar, MD Visit Date: 01/09/2023 Occupation: '@GUAROCC'$ @  Subjective:  Medication monitoring  History of Present Illness: Jacob Farmer is a 77 y.o. male with history of seronegative rheumatoid arthritis, osteoarthritis and gouty arthropathy.  He states he developed a callus on the bottom of his right fifth toe.  He was seen by Dr. Posey Pronto who recommended surgery to prevent recurrence of ulcer.  He states his gout is well-controlled he has been taking allopurinol 50 mg every other day.  He has not had a gout flare.  He takes colchicine only on as needed basis.  He has been on leflunomide 10 mg p.o. daily.  He had no interruption in the treatment.  He has intermittent discomfort in his lower back.  He did well after the cortisone injection for facet joint discomfort.    Activities of Daily Living:  Patient reports morning stiffness for 0 minute.   Patient Denies nocturnal pain.  Difficulty dressing/grooming: Denies Difficulty climbing stairs: Denies Difficulty getting out of chair: Reports Difficulty using hands for taps, buttons, cutlery, and/or writing: Denies  Review of Systems  Constitutional: Negative.  Negative for fatigue.  HENT: Negative.  Negative for mouth sores and mouth dryness.   Eyes: Negative.  Negative for dryness.  Respiratory:  Negative for shortness of breath.   Cardiovascular: Negative.  Negative for chest pain and palpitations.  Gastrointestinal: Negative.  Negative for blood in stool, constipation and diarrhea.  Endocrine: Negative.  Negative for increased urination.  Genitourinary: Negative.  Negative for involuntary urination.  Musculoskeletal: Negative.  Negative for joint pain, gait problem, joint pain, joint swelling, myalgias, muscle weakness, morning stiffness, muscle tenderness and myalgias.   Skin: Negative.  Negative for color change, rash, hair loss and sensitivity to sunlight.  Allergic/Immunologic: Negative for susceptible to infections.  Neurological:  Negative for dizziness and headaches.  Hematological: Negative.  Negative for swollen glands.  Psychiatric/Behavioral: Negative.  Negative for depressed mood and sleep disturbance. The patient is not nervous/anxious.     PMFS History:  Patient Active Problem List   Diagnosis Date Noted   Bleeding from right ear 12/25/2022   Chronic heart failure with preserved ejection fraction ([place]) 07/22/2022   Tachycardia-bradycardia syndrome ([place]) 07/18/2022   Dizziness 04/09/2022   Iron deficiency anemia 02/28/2022   Encounter for general adult medical examination with abnormal findings 01/25/2022   Paroxysmal atrial fibrillation ([place]) 01/22/2022   Allergic dermatitis 09/12/2021   History of gastric ulcer    Herniated cervical disc 05/26/2017    Class: Acute   Cervical spinal stenosis 05/26/2017   Primary osteoarthritis of both hands 02/04/2017   History of gout 02/04/2017   DDD (degenerative disc disease), lumbar 02/04/2017   High risk medication use 01/28/2017   History of prostate cancer 01/28/2017   Chronic kidney disease, stage 4 (severe) ([place]) 12/27/2011   Type 2 diabetes mellitus with diabetic chronic kidney disease ([place]) 12/27/2011   Rheumatoid arthritis ([place]) 01/04/2011   SINUS BRADYCARDIA 01/12/2010   Coronary artery disease involving native coronary artery of native heart without angina pectoris 01/12/2010   Hyperlipidemia 01/08/2010   Essential hypertension 01/08/2010    Past Medical History:  Diagnosis Date   CAD (  coronary artery disease)    Acute myocardial infarction treated with TPA in 1990; 1999-stents to circumflex and RCA; residual total occlusion of the left anterior descending; normal ejection fraction; stress nuclear in 2001-distal anteroseptal ischemia; normal LV function   CKD (chronic kidney  disease) stage 4, GFR 15-29 ml/min (HCC)    First degree heart block    History of gastric ulcer    History of kidney stones    History of MI (myocardial infarction)    1990-  treated w/ TPA   Hyperlipidemia    Hypertension    Jaundice    OA (osteoarthritis)    Prediabetes    Prostate cancer (HCC)    Stage T1c , Gleason 3+4,  PSA 4.7,  vol 24cc--  scheduled for radiative seed implants   Psoriatic arthritis (HCC)    RA (rheumatoid arthritis) ([place])    Dr. Toni Amend   Sinus bradycardia    Wears dentures     Family History  Problem Relation Age of Onset   Heart attack Mother    Coronary artery disease Father    Ulcerative colitis Father    Ulcers Father    Breast cancer Sister    COPD Brother    Pancreatic cancer Brother    Healthy Sister    Diabetes Brother    Cirrhosis Son    Seizures Son    Past Surgical History:  Procedure Laterality Date   BIOPSY  08/27/2019   Procedure: BIOPSY;  Surgeon: Rogene Houston, MD;  [facility];  Service: Endoscopy;;  gastric   BIOPSY  04/03/2022   Procedure: BIOPSY;  Surgeon: Montez Morita, Quillian Quince, MD;  [facility];  Service: Gastroenterology;;   callus removal Left 09/13/2022   left foot   CARDIOVASCULAR STRESS TEST  2001  per Dr Lattie Haw clinic note   distal anteroseptal ischemia,  normal LVF   COLONOSCOPY  last one 2006 (approx)   COLONOSCOPY WITH PROPOFOL N/A 04/03/2022   Procedure: COLONOSCOPY WITH PROPOFOL;  Surgeon: Harvel Quale, MD;  [facility];  Service: Gastroenterology;  Laterality: N/A;  [place]   DES x2  to CFX and RCA/  residual total occlusion LAD,  normal LVEF   ESOPHAGOGASTRODUODENOSCOPY (EGD) WITH PROPOFOL N/A 08/27/2019   Procedure: ESOPHAGOGASTRODUODENOSCOPY (EGD) WITH PROPOFOL;  Surgeon: Rogene Houston, MD;  [facility];  Service: Endoscopy;  Laterality: N/A;  10:10   ESOPHAGOGASTRODUODENOSCOPY (EGD)  WITH PROPOFOL N/A 04/03/2022   Procedure: ESOPHAGOGASTRODUODENOSCOPY (EGD) WITH PROPOFOL;  Surgeon: Harvel Quale, MD;  [facility];  Service: Gastroenterology;  Laterality: N/A;   EYE SURGERY Bilateral    cataract   HOT HEMOSTASIS  04/03/2022   Procedure: HOT HEMOSTASIS (ARGON PLASMA COAGULATION/BICAP);  Surgeon: Montez Morita, Quillian Quince, MD;  [facility];  Service: Gastroenterology;;   POLYPECTOMY  04/03/2022   Procedure: POLYPECTOMY;  Surgeon: Harvel Quale, MD;  [facility];  Service: Gastroenterology;;   POSTERIOR CERVICAL FUSION/FORAMINOTOMY N/A 05/26/2017   Procedure: RIGHT C4-5 FORAMINOTOMY WITH EXCISION OF HERNIATED [place];  Surgeon: Jessy Oto, MD;  Location: [facility];  Service: Orthopedics;  Laterality: N/A;   RADIOACTIVE SEED IMPLANT N/A 01/11/2016   Procedure: RADIOACTIVE SEED IMPLANT/BRACHYTHERAPY IMPLANT;  Surgeon: Franchot Gallo, MD;  [facility];  Service: Urology;  Laterality: N/A;   73  seeds implanted no seeds founds in bladder   Social History   Social History Narrative  Married for 24 years.Retired Clinical biochemist.   Immunization History  Administered Date(s) Administered   Fluad Quad(high Dose 65+) 09/10/2022   Influenza, High Dose Seasonal PF 09/12/2017, 08/28/2019, 08/28/2019   Influenza-Unspecified 08/08/2020, 08/24/2021   PFIZER(Purple Top)SARS-COV-2 Vaccination 01/22/2020, 02/12/2020, 08/15/2020, 01/19/2021   PNEUMOCOCCAL CONJUGATE-20 01/28/2022   Pneumococcal Polysaccharide-23 08/16/2009   Td 10/26/2021   Tdap 09/28/2021   Zoster Recombinat (Shingrix) 07/29/2019, 09/28/2021     Objective: Vital Signs: BP (!) 164/78 (BP Location: Left Arm, Patient Position: Sitting, Cuff Size: Normal)   Pulse 81   Resp 16   Ht '5\' 10"'$  (1.778 m)   Wt 187 lb (84.8 kg)   BMI 26.83 kg/m    Physical Exam Vitals and nursing note reviewed.  Constitutional:      Appearance: He is  well-developed.  HENT:     Head: Normocephalic and atraumatic.  Eyes:     Conjunctiva/sclera: Conjunctivae normal.     Pupils: Pupils are equal, round, and reactive to light.  Cardiovascular:     Rate and Rhythm: Normal rate and regular rhythm.     Heart sounds: Normal heart sounds.  Pulmonary:     Effort: Pulmonary effort is normal.     Breath sounds: Normal breath sounds.  Abdominal:     General: Bowel sounds are normal.     Palpations: Abdomen is soft.  Musculoskeletal:     Cervical back: Normal range of motion and neck supple.  Skin:    General: Skin is warm and dry.     Capillary Refill: Capillary refill takes less than 2 seconds.  Neurological:     Mental Status: He is alert and oriented to person, place, and time.  Psychiatric:        Behavior: Behavior normal.      Musculoskeletal Exam: Cervical spine was in good range of motion.  Shoulder joints, elbows, wrist, MCPs PIPs and DIPs were in good range of motion with no synovitis.  Hip joints and knee joints in good range of motion without any warmth swelling or effusion.  There was no tenderness over ankles or MTPs.  CDAI Exam: CDAI Score: -- Patient Global: 0 mm; Provider Global: 0 mm Swollen: --; Tender: -- Joint Exam 01/09/2023   No joint exam has been documented for this visit   There is currently no information documented on the homunculus. Go to the Rheumatology activity and complete the homunculus joint exam.  Investigation: No additional findings.  Imaging: No results found.  Recent Labs: Lab Results  Component Value Date   WBC 6.6 01/01/2023   HGB 11.9 (L) 01/01/2023   PLT 163 01/01/2023   NA 142 01/01/2023   K 4.0 01/01/2023   CL 107 01/01/2023   CO2 20 01/01/2023   GLUCOSE 107 (H) 01/01/2023   BUN 33 (H) 01/01/2023   CREATININE 3.50 (H) 01/01/2023   BILITOT 0.4 01/01/2023   ALKPHOS 254 (H) 11/06/2020   AST 23 01/01/2023   ALT 19 01/01/2023   PROT 6.8 01/01/2023   ALBUMIN 2.9 (L)  11/06/2020   CALCIUM 8.7 01/01/2023   GFRAA 27 (L) 06/14/2021    Speciality Comments: No specialty comments available.  Procedures:  No procedures performed Allergies: Patient has no known allergies.   Assessment / Plan:     Visit Diagnoses: Seronegative rheumatoid arthritis (HCC)-patient had no synovitis on the examination today.  He denies having a rheumatoid arthritis flare.  He has been taking leflunomide 10 mg p.o. daily.  High risk medication use - Arava 10  mg 1 tablet by mouth daily.  Labs obtained on January 01, 2023 showed hemoglobin of 11.9 and creatinine 3.5.  AST and ALT were normal.  He was advised to get labs every 3 months.  Information on immunization was placed in the AVS.  Stiffness of both knees-no warmth swelling or effusion was noted.  Idiopathic chronic gout of multiple sites without tophus - allopurinol 50 mg every other day.he has a prescription for colchicine which she takes as needed during flares. uric acid: 5.0 on 05/09/2022. - Plan: Uric acid  Primary osteoarthritis of both hands-bilateral DIP and PIP thickening.  No synovitis was noted.  Left foot pain-he had ulceration on his left foot which completely healed.  He developed a callus on the plantar aspect of his left fifth metatarsal.  He was evaluated by Dr. Posey Pronto on January 01, 2023.  He recommended surgery because of recurrent ulceration.  Patient scheduled to have surgery tomorrow.  Advised patient to stop leflunomide and restart leflunomide after he has clearance from Dr. Posey Pronto.  DDD (degenerative disc disease), lumbar -he is followed by Dr. Louanne Skye.  He had facet joint injection on 10/15/2022 by Dr. Ernestina Patches which gave relief..  History of renal insufficiency syndrome - Followed closely by nephrology.  Patient has an appointment coming up next month.  Osteoporosis screening - DEXA scan from February 08, 2021 was normal.  History of hypercholesterolemia  History of hypertension-blood pressure was elevated  at 175/76 today.  Repeat blood pressure was elevated.  He was advised to monitor blood pressure closely and follow-up with his PCP.  History of coronary artery disease  History of gastroesophageal reflux (GERD)  History of prostate cancer  Orders: Orders Placed This Encounter  Procedures   Uric acid   No orders of the defined types were placed in this encounter.    Follow-Up Instructions: Return in about 5 months (around 06/10/2023) for Rheumatoid arthritis, Gout.   Bo Merino, MD  Note - This record has been created using Editor, commissioning.  Chart creation errors have been sought, but may not always  have been located. Such creation errors do not reflect on  the standard of medical care.

## 2022-12-27 DIAGNOSIS — H903 Sensorineural hearing loss, bilateral: Secondary | ICD-10-CM | POA: Diagnosis not present

## 2022-12-31 ENCOUNTER — Other Ambulatory Visit: Payer: Self-pay | Admitting: *Deleted

## 2022-12-31 DIAGNOSIS — Z79899 Other long term (current) drug therapy: Secondary | ICD-10-CM

## 2023-01-01 DIAGNOSIS — Z79899 Other long term (current) drug therapy: Secondary | ICD-10-CM | POA: Diagnosis not present

## 2023-01-02 ENCOUNTER — Other Ambulatory Visit: Payer: Self-pay | Admitting: *Deleted

## 2023-01-02 DIAGNOSIS — M0600 Rheumatoid arthritis without rheumatoid factor, unspecified site: Secondary | ICD-10-CM

## 2023-01-02 LAB — CBC WITH DIFFERENTIAL/PLATELET
Absolute Monocytes: 766 cells/uL (ref 200–950)
Basophils Absolute: 53 cells/uL (ref 0–200)
Basophils Relative: 0.8 %
Eosinophils Absolute: 238 cells/uL (ref 15–500)
Eosinophils Relative: 3.6 %
HCT: 36.8 % — ABNORMAL LOW (ref 38.5–50.0)
Hemoglobin: 11.9 g/dL — ABNORMAL LOW (ref 13.2–17.1)
Lymphs Abs: 825 cells/uL — ABNORMAL LOW (ref 850–3900)
MCH: 28.3 pg (ref 27.0–33.0)
MCHC: 32.3 g/dL (ref 32.0–36.0)
MCV: 87.6 fL (ref 80.0–100.0)
MPV: 12.3 fL (ref 7.5–12.5)
Monocytes Relative: 11.6 %
Neutro Abs: 4719 cells/uL (ref 1500–7800)
Neutrophils Relative %: 71.5 %
Platelets: 163 10*3/uL (ref 140–400)
RBC: 4.2 10*6/uL (ref 4.20–5.80)
RDW: 12.6 % (ref 11.0–15.0)
Total Lymphocyte: 12.5 %
WBC: 6.6 10*3/uL (ref 3.8–10.8)

## 2023-01-02 LAB — COMPLETE METABOLIC PANEL WITH GFR
AG Ratio: 1.3 (calc) (ref 1.0–2.5)
ALT: 19 U/L (ref 9–46)
AST: 23 U/L (ref 10–35)
Albumin: 3.9 g/dL (ref 3.6–5.1)
Alkaline phosphatase (APISO): 108 U/L (ref 35–144)
BUN/Creatinine Ratio: 9 (calc) (ref 6–22)
BUN: 33 mg/dL — ABNORMAL HIGH (ref 7–25)
CO2: 20 mmol/L (ref 20–32)
Calcium: 8.7 mg/dL (ref 8.6–10.3)
Chloride: 107 mmol/L (ref 98–110)
Creat: 3.5 mg/dL — ABNORMAL HIGH (ref 0.70–1.28)
Globulin: 2.9 g/dL (calc) (ref 1.9–3.7)
Glucose, Bld: 107 mg/dL — ABNORMAL HIGH (ref 65–99)
Potassium: 4 mmol/L (ref 3.5–5.3)
Sodium: 142 mmol/L (ref 135–146)
Total Bilirubin: 0.4 mg/dL (ref 0.2–1.2)
Total Protein: 6.8 g/dL (ref 6.1–8.1)
eGFR: 17 mL/min/{1.73_m2} — ABNORMAL LOW (ref >=60)

## 2023-01-02 MED ORDER — LEFLUNOMIDE 10 MG PO TABS: 10.0000 mg | ORAL_TABLET | Freq: Every day | ORAL | 0 refills | Status: DC

## 2023-01-02 NOTE — Progress Notes (Signed)
Creatinine is higher and GFR is low.  Anemia is stable.  Lymphocyte count is low due to immunosuppression.  Please forward results to patient's nephrologist .

## 2023-01-02 NOTE — Telephone Encounter (Signed)
Patient request refill on Arava to be sent to [facility].   Next Visit: 01/09/2023  Last Visit: 09/20/2022  Last Fill: 08/29/2022  DX: Seronegative rheumatoid arthritis   Current Dose per office note 09/20/2022: Arava 10 mg 1 tablet by mouth daily    Labs: 01/01/2023 Creatinine is higher and GFR is low.  Anemia is stable.  Lymphocyte count is low due to immunosuppression.     Okay to refill Arava?

## 2023-01-07 ENCOUNTER — Telehealth: Payer: Self-pay | Admitting: Podiatry

## 2023-01-07 ENCOUNTER — Ambulatory Visit: Payer: Medicare HMO | Admitting: Podiatry

## 2023-01-07 DIAGNOSIS — Z01818 Encounter for other preprocedural examination: Secondary | ICD-10-CM

## 2023-01-07 DIAGNOSIS — M216X2 Other acquired deformities of left foot: Secondary | ICD-10-CM

## 2023-01-07 NOTE — Telephone Encounter (Addendum)
DOS: 01/13/2023  [employer] Medicare Effective 12/16/2021  Metatarsal Osteotomy 5th Lt [phone])  Deductible: $0 Out-of-Pocket: $3,600 with $0 met CoInsurance: 0%  Authorization #: Tracking #: [id number] Authorization Valid: 01/13/2023 Only

## 2023-01-07 NOTE — Progress Notes (Signed)
Subjective:  Patient ID: Jacob Farmer, male    DOB: Apr 17, 1946,  MRN: [id number]  Chief Complaint  Patient presents with   Callouses    77 y.o. male presents for wound care.  Patient presents for follow-up submetatarsal 5 porokeratosis/history of ulceration.  Patient states that still painful to touch he would like to discuss surgical options as he has failed all conservative care  Review of Systems: Negative except as noted in the HPI. Denies N/V/F/Ch.  Past Medical History:  Diagnosis Date   CAD (coronary artery disease)    Acute myocardial infarction treated with TPA in 1990; 1999-stents to circumflex and RCA; residual total occlusion of the left anterior descending; normal ejection fraction; stress nuclear in 2001-distal anteroseptal ischemia; normal LV function   CKD (chronic kidney disease) stage 4, GFR 15-29 ml/min (HCC)    First degree heart block    History of gastric ulcer    History of kidney stones    History of MI (myocardial infarction)    1990-  treated w/ TPA   Hyperlipidemia    Hypertension    Jaundice    OA (osteoarthritis)    Prediabetes    Prostate cancer (HCC)    Stage T1c , Gleason 3+4,  PSA 4.7,  vol 24cc--  scheduled for radiative seed implants   Psoriatic arthritis (HCC)    RA (rheumatoid arthritis) ([place])    Dr. Toni Amend   Sinus bradycardia    Wears dentures     Current Outpatient Medications:    Alcohol Swabs (DROPSAFE ALCOHOL PREP) 70 % PADS, USE AS NEEDED TO CLEAN SKIN PRIOR TO FINGERPRICK AND INJECTION, Disp: 100 each, Rfl: 5   allopurinol (ZYLOPRIM) 100 MG tablet, TAKE 1/2 TABLET EVERY OTHER DAY, Disp: 45 tablet, Rfl: 0   amLODipine (NORVASC) 10 MG tablet, Take 1 tablet (10 mg total) by mouth daily., Disp: 90 tablet, Rfl: 3   apixaban (ELIQUIS) 5 MG TABS tablet, Take 1 tablet (5 mg total) by mouth 2 (two) times daily., Disp: 180 tablet, Rfl: 3   Blood Glucose Monitoring Suppl (ACCU-CHEK GUIDE) w/Device KIT, 1 Units by Does not apply route 2  (two) times daily., Disp: 1 kit, Rfl: 3   Colchicine 0.6 MG CAPS, Take 0.6 mg by mouth daily as needed (Gout). (Patient not taking: Reported on 10/22/2022), Disp: , Rfl:    dapagliflozin propanediol (FARXIGA) 5 MG TABS tablet, Take by mouth daily., Disp: , Rfl:    Dupilumab (DUPIXENT) 300 MG/2ML SOPN, Inject 1 application into the skin every 14 (fourteen) days., Disp: 300 mL, Rfl: 4   Dupilumab (DUPIXENT) 300 MG/2ML SOPN, Inject 300 mg into the skin every 14 (fourteen) days. Starting at day 15 for maintenance., Disp: 4 mL, Rfl: 6   folic acid (FOLVITE) 194 MCG tablet, Take 400 mcg by mouth daily., Disp: , Rfl:    glucose blood (ACCU-CHEK GUIDE) test strip, Use as instructed, Disp: 100 each, Rfl: 12   hydrALAZINE (APRESOLINE) 50 MG tablet, Take 100 mg by mouth 3 (three) times daily., Disp: , Rfl:    Lancets (ACCU-CHEK SOFT TOUCH) lancets, Use as instructed, Disp: 100 each, Rfl: 12   leflunomide (ARAVA) 10 MG tablet, Take 1 tablet (10 mg total) by mouth daily., Disp: 90 tablet, Rfl: 0   nitroGLYCERIN (NITROSTAT) 0.4 MG SL tablet, Place 0.4 mg under the tongue every 5 (five) minutes as needed for chest pain. , Disp: , Rfl:    omeprazole (PRILOSEC) 40 MG capsule, Take 1 capsule (40 mg  total) by mouth daily., Disp: 90 capsule, Rfl: 3   rosuvastatin (CRESTOR) 40 MG tablet, TAKE 1 TABLET EVERY DAY . STOP ATORVASTATIN, Disp: 90 tablet, Rfl: 1   triamcinolone cream (KENALOG) 0.1 %, APPLY 1 APPLICATION TOPICALLY DAILY AS NEEDED (RASH). (Patient taking differently: Apply 1 application  topically daily.), Disp: 454 g, Rfl: 3  Social History   Tobacco Use  Smoking Status Former   Packs/day: 1.00   Years: 30.00   Total pack years: 30.00   Types: Cigarettes   Quit date: 05/21/1989   Years since quitting: 33.6   Passive exposure: Never  Smokeless Tobacco Never    No Known Allergies Objective:  There were no vitals filed for this visit. There is no height or weight on file to calculate  BMI. Constitutional Well developed. Well nourished.  Vascular Dorsalis pedis pulses palpable bilaterally. Posterior tibial pulses palpable bilaterally. Capillary refill normal to all digits.  No cyanosis or clubbing noted. Pedal hair growth normal.  Neurologic Normal speech. Oriented to person, place, and time. Protective sensation absent  Dermatologic Skin completely epithelialized.  No signs of wound noted.  No other complication noted.  No infections noted.  Orthopedic: No pain to palpation either foot.   Radiographs: 3 views of skeletally mature adult left foot: No signs of osteomyelitis noted.  No breakdown of bone noted.  Slight osteopenia noted.  No other bony abnormalities identified. Assessment:   1. Plantar flexed metatarsal bone of left foot   2. Encounter for preoperative examination for general surgical procedure      Plan:  Patient was evaluated and treated and all questions answered.  Ulcer left submetatarsal 5 ulceration with fat layer exposed with underlying plantarflexed fifth metatarsal -Patient is states that it still continues to bother him.  At this time he would like to discuss surgical options as he has failed all conservative care including padding protecting shoe gear modification.  Patient would like to proceed with surgery floating osteotomy of the fifth metatarsal.  I discussed my preoperative intra postop plan in extensive detail he states understanding would like to proceed with surgery -Informed surgical risk consent was reviewed and read aloud to the patient.  I reviewed the films.  I have discussed my findings with the patient in great detail.  I have discussed all risks including but not limited to infection, stiffness, scarring, limp, disability, deformity, damage to blood vessels and nerves, numbness, poor healing, need for braces, arthritis, chronic pain, amputation, death.  All benefits and realistic expectations discussed in great detail.  I have  made no promises as to the outcome.  I have provided realistic expectations.  I have offered the patient a 2nd opinion, which they have declined and assured me they preferred to proceed despite the risks   No follow-ups on file.

## 2023-01-08 ENCOUNTER — Telehealth: Payer: Self-pay | Admitting: *Deleted

## 2023-01-08 ENCOUNTER — Telehealth: Payer: Self-pay | Admitting: Cardiology

## 2023-01-08 NOTE — Telephone Encounter (Signed)
Patient with diagnosis of A Fib on Eliquis for anticoagulation.    Procedure: Metatarsal Osteotomy of 5th Toe - Left Foot  Date of procedure: 01/13/23   CHA2DS2-VASc Score = 6  This indicates a 9.7% annual risk of stroke. The patient's score is based upon: CHF History: 1 HTN History: 1 Diabetes History: 1 Stroke History: 0 Vascular Disease History: 1 Age Score: 2 Gender Score: 0    CrCl 18 mL/min Platelet count 163K  Per office protocol, patient can hold Eliquis for 2-3 days prior to procedure.   **This guidance is not considered finalized until pre-operative APP has relayed final recommendations.**

## 2023-01-08 NOTE — Telephone Encounter (Signed)
S/w the pt and his wife and they are agreeable to add on for 01/09/23 @ 10:40 due to med hold and procedure date.  Med rec and consent are done.     Patient Consent for Virtual Visit        [facility] has provided verbal consent on 01/08/2023 for a virtual visit (video or telephone).   CONSENT FOR VIRTUAL VISIT FOR:  Jacob Farmer  By participating in this virtual visit I agree to the following:  I hereby voluntarily request, consent and authorize [facility] and its employed or contracted physicians, physician assistants, nurse practitioners or other licensed health care professionals (the Practitioner), to provide me with telemedicine health care services (the "Services") as deemed necessary by the treating Practitioner. I acknowledge and consent to receive the Services by the Practitioner via telemedicine. I understand that the telemedicine visit will involve communicating with the Practitioner through live audiovisual communication technology and the disclosure of certain medical information by electronic transmission. I acknowledge that I have been given the opportunity to request an in-person assessment or other available alternative prior to the telemedicine visit and am voluntarily participating in the telemedicine visit.  I understand that I have the right to withhold or withdraw my consent to the use of telemedicine in the course of my care at any time, without affecting my right to future care or treatment, and that the Practitioner or I may terminate the telemedicine visit at any time. I understand that I have the right to inspect all information obtained and/or recorded in the course of the telemedicine visit and may receive copies of available information for a reasonable fee.  I understand that some of the potential risks of receiving the Services via telemedicine include:  Delay or interruption in medical evaluation due to technological equipment failure or  disruption; Information transmitted may not be sufficient (e.g. poor resolution of images) to allow for appropriate medical decision making by the Practitioner; and/or  In rare instances, security protocols could fail, causing a breach of personal health information.  Furthermore, I acknowledge that it is my responsibility to provide information about my medical history, conditions and care that is complete and accurate to the best of my ability. I acknowledge that Practitioner's advice, recommendations, and/or decision may be based on factors not within their control, such as incomplete or inaccurate data provided by me or distortions of diagnostic images or specimens that may result from electronic transmissions. I understand that the practice of medicine is not an exact science and that Practitioner makes no warranties or guarantees regarding treatment outcomes. I acknowledge that a copy of this consent can be made available to me via my patient portal ([facility]), or I can request a printed copy by calling the office of [facility].    I understand that my insurance will be billed for this visit.   I have read or had this consent read to me. I understand the contents of this consent, which adequately explains the benefits and risks of the Services being provided via telemedicine.  I have been provided ample opportunity to ask questions regarding this consent and the Services and have had my questions answered to my satisfaction. I give my informed consent for the services to be provided through the use of telemedicine in my medical care

## 2023-01-08 NOTE — Telephone Encounter (Signed)
   [facility] Pre-operative Risk Assessment    Request for surgical clearance:  What type of surgery is being performed?   Metatarsal Osteotomy of 5th Toe - Left Foot When is this surgery scheduled?  01/13/23   What type of clearance is required (medical clearance vs. Pharmacy clearance to hold med vs. Both)?  Both   Are there any medications that need to be held prior to surgery and how long? Please advise on Eliquis    Practice name and name of physician performing surgery?  [facility]  Dr. Boneta Lucks   What is your office phone number? [phone]   7.   What is your office fax number? [phone]   8.   Anesthesia type (None, local, MAC, general) ?  Anesthesiologists Choice   Zara Council 01/08/2023, 9:51 AM

## 2023-01-08 NOTE — Telephone Encounter (Signed)
   Name: Jacob Farmer  DOB: 07/05/1946  MRN: [id number]  Primary Cardiologist: Carlyle Dolly, MD   Preoperative team, please contact this patient and set up a phone call appointment for further preoperative risk assessment. Please obtain consent and complete medication review. Thank you for your help.  I confirm that guidance regarding antiplatelet and oral anticoagulation therapy has been completed and, if necessary, noted below.  Per office protocol, patient can hold Eliquis for 2-3 days prior to procedure.    Lenna Sciara, NP 01/08/2023, 10:38 AM [place]

## 2023-01-08 NOTE — Telephone Encounter (Signed)
S/w the pt and his wife and they are agreeable to add on for 01/09/23 @ 10:40 due to med hold and procedure date.  Med rec and consent are done.

## 2023-01-09 ENCOUNTER — Ambulatory Visit (INDEPENDENT_AMBULATORY_CARE_PROVIDER_SITE_OTHER): Payer: Medicare HMO | Admitting: Physician Assistant

## 2023-01-09 ENCOUNTER — Ambulatory Visit: Payer: Medicare HMO | Attending: Rheumatology | Admitting: Rheumatology

## 2023-01-09 ENCOUNTER — Encounter: Payer: Self-pay | Admitting: Rheumatology

## 2023-01-09 VITALS — BP 164/78 | HR 81 | Resp 16 | Ht 70.0 in | Wt 187.0 lb

## 2023-01-09 DIAGNOSIS — M51369 Other intervertebral disc degeneration, lumbar region without mention of lumbar back pain or lower extremity pain: Secondary | ICD-10-CM

## 2023-01-09 DIAGNOSIS — M1A09X0 Idiopathic chronic gout, multiple sites, without tophus (tophi): Secondary | ICD-10-CM

## 2023-01-09 DIAGNOSIS — Z87448 Personal history of other diseases of urinary system: Secondary | ICD-10-CM | POA: Diagnosis not present

## 2023-01-09 DIAGNOSIS — Z13820 Encounter for screening for osteoporosis: Secondary | ICD-10-CM

## 2023-01-09 DIAGNOSIS — Z8639 Personal history of other endocrine, nutritional and metabolic disease: Secondary | ICD-10-CM | POA: Diagnosis not present

## 2023-01-09 DIAGNOSIS — Z79899 Other long term (current) drug therapy: Secondary | ICD-10-CM

## 2023-01-09 DIAGNOSIS — M25661 Stiffness of right knee, not elsewhere classified: Secondary | ICD-10-CM

## 2023-01-09 DIAGNOSIS — M79672 Pain in left foot: Secondary | ICD-10-CM

## 2023-01-09 DIAGNOSIS — Z8679 Personal history of other diseases of the circulatory system: Secondary | ICD-10-CM

## 2023-01-09 DIAGNOSIS — L97522 Non-pressure chronic ulcer of other part of left foot with fat layer exposed: Secondary | ICD-10-CM

## 2023-01-09 DIAGNOSIS — M5136 Other intervertebral disc degeneration, lumbar region: Secondary | ICD-10-CM

## 2023-01-09 DIAGNOSIS — Z8719 Personal history of other diseases of the digestive system: Secondary | ICD-10-CM

## 2023-01-09 DIAGNOSIS — Z01810 Encounter for preprocedural cardiovascular examination: Secondary | ICD-10-CM | POA: Diagnosis not present

## 2023-01-09 DIAGNOSIS — M0600 Rheumatoid arthritis without rheumatoid factor, unspecified site: Secondary | ICD-10-CM | POA: Diagnosis not present

## 2023-01-09 DIAGNOSIS — M25662 Stiffness of left knee, not elsewhere classified: Secondary | ICD-10-CM

## 2023-01-09 DIAGNOSIS — M19042 Primary osteoarthritis, left hand: Secondary | ICD-10-CM

## 2023-01-09 DIAGNOSIS — M19041 Primary osteoarthritis, right hand: Secondary | ICD-10-CM | POA: Diagnosis not present

## 2023-01-09 DIAGNOSIS — Z8546 Personal history of malignant neoplasm of prostate: Secondary | ICD-10-CM

## 2023-01-09 NOTE — Progress Notes (Signed)
Virtual Visit via Telephone Note   Because of Jacob Farmer's co-morbid illnesses, he is at least at moderate risk for complications without adequate follow up.  This format is felt to be most appropriate for this patient at this time.  The patient did not have access to video technology/had technical difficulties with video requiring transitioning to audio format only (telephone).  All issues noted in this document were discussed and addressed.  No physical exam could be performed with this format.  Please refer to the patient's chart for his consent to telehealth for [facility].  Evaluation Performed:  Preoperative cardiovascular risk assessment _____________   Date:  01/09/2023   Patient ID:  Jacob Farmer, DOB August 23, 1946, MRN [id number] Patient Location:  Home Provider location:   Office  Primary Care Provider:  Lindell Spar, MD Primary Cardiologist:  Carlyle Dolly, MD  Chief Complaint / Patient Profile   77 y.o. y/o male with a h/o tachycardia, bradycardia, atrial fibrillation, orthostatic hypotension, CAD, HTN, hyperlipidemia, stage IV CKD, prostate cancer, rheumatoid arthritis who is pending metatarsal osteotomy of fifth toe (left foot) on 01/13/2023 and presents today for telephonic preoperative cardiovascular risk assessment.  History of Present Illness    Jacob Farmer is a 77 y.o. male who presents via audio/video conferencing for a telehealth visit today.  Pt was last seen in cardiology clinic on 10/22/2022 by Dr. Carlyle Dolly.  At that time Jacob Farmer was doing well .  The patient is now pending procedure as outlined above. Since his last visit, he is doing well.  I spoke to his wife Jacob Farmer on the phone since the patient is hard of hearing.  He has no issues with chest pain or shortness of breath.  He is able to walk 1-2 blocks with breaks.  He does have arthritis in his lower back and knees which limits his mobility.  He does make the bed in the  morning and cooks.  He does his lawn but uses a zero  turn mower.  He is to golf but has not golfed in a couple of years due to back pain from arthritis.  Per office protocol, patient can hold Eliquis for 2-3 days prior to procedure. Please restart when safe to do so.  Past Medical History    Past Medical History:  Diagnosis Date   CAD (coronary artery disease)    Acute myocardial infarction treated with TPA in 1990; 1999-stents to circumflex and RCA; residual total occlusion of the left anterior descending; normal ejection fraction; stress nuclear in 2001-distal anteroseptal ischemia; normal LV function   CKD (chronic kidney disease) stage 4, GFR 15-29 ml/min (HCC)    First degree heart block    History of gastric ulcer    History of kidney stones    History of MI (myocardial infarction)    1990-  treated w/ TPA   Hyperlipidemia    Hypertension    Jaundice    OA (osteoarthritis)    Prediabetes    Prostate cancer (HCC)    Stage T1c , Gleason 3+4,  PSA 4.7,  vol 24cc--  scheduled for radiative seed implants   Psoriatic arthritis ([place])    RA (rheumatoid arthritis) ([place])    Dr. Toni Amend   Sinus bradycardia    Wears dentures    Past Surgical History:  Procedure Laterality Date   BIOPSY  08/27/2019   Procedure: BIOPSY;  Surgeon: Rogene Houston, MD;  [facility];  Service: Endoscopy;;  gastric   BIOPSY  04/03/2022   Procedure: BIOPSY;  Surgeon: Montez Morita, Quillian Quince, MD;  [facility];  Service: Gastroenterology;;   callus removal Left 09/13/2022   left foot   CARDIOVASCULAR STRESS TEST  2001  per Dr Lattie Haw clinic note   distal anteroseptal ischemia,  normal LVF   COLONOSCOPY  last one 2006 (approx)   COLONOSCOPY WITH PROPOFOL N/A 04/03/2022   Procedure: COLONOSCOPY WITH PROPOFOL;  Surgeon: Harvel Quale, MD;  [facility];  Service: Gastroenterology;  Laterality: N/A;  [place]    DES x2  to CFX and RCA/  residual total occlusion LAD,  normal LVEF   ESOPHAGOGASTRODUODENOSCOPY (EGD) WITH PROPOFOL N/A 08/27/2019   Procedure: ESOPHAGOGASTRODUODENOSCOPY (EGD) WITH PROPOFOL;  Surgeon: Rogene Houston, MD;  [facility];  Service: Endoscopy;  Laterality: N/A;  10:10   ESOPHAGOGASTRODUODENOSCOPY (EGD) WITH PROPOFOL N/A 04/03/2022   Procedure: ESOPHAGOGASTRODUODENOSCOPY (EGD) WITH PROPOFOL;  Surgeon: Harvel Quale, MD;  [facility];  Service: Gastroenterology;  Laterality: N/A;   EYE SURGERY Bilateral    cataract   HOT HEMOSTASIS  04/03/2022   Procedure: HOT HEMOSTASIS (ARGON PLASMA COAGULATION/BICAP);  Surgeon: Montez Morita, Quillian Quince, MD;  [facility];  Service: Gastroenterology;;   POLYPECTOMY  04/03/2022   Procedure: POLYPECTOMY;  Surgeon: Harvel Quale, MD;  [facility];  Service: Gastroenterology;;   POSTERIOR CERVICAL FUSION/FORAMINOTOMY N/A 05/26/2017   Procedure: RIGHT C4-5 FORAMINOTOMY WITH EXCISION OF HERNIATED [place];  Surgeon: Jessy Oto, MD;  Location: [place];  Service: Orthopedics;  Laterality: N/A;   RADIOACTIVE SEED IMPLANT N/A 01/11/2016   Procedure: RADIOACTIVE SEED IMPLANT/BRACHYTHERAPY IMPLANT;  Surgeon: Franchot Gallo, MD;  Location: [facility];  Service: Urology;  Laterality: N/A;   73  seeds implanted no seeds founds in bladder    Allergies  No Known Allergies  Home Medications    Prior to Admission medications   Medication Sig Start Date End Date Taking? Authorizing Provider  Alcohol Swabs (DROPSAFE ALCOHOL PREP) 70 % PADS USE AS NEEDED TO CLEAN SKIN PRIOR TO FINGERPRICK AND INJECTION 09/07/21   Ailene Ards, NP  allopurinol (ZYLOPRIM) 100 MG tablet TAKE 1/2 TABLET EVERY OTHER DAY 04/10/22   Bo Merino, MD  amLODipine (NORVASC) 10 MG tablet Take 1 tablet (10 mg total) by mouth daily. 12/25/22   Lindell Spar, MD  apixaban (ELIQUIS) 5 MG TABS tablet  Take 1 tablet (5 mg total) by mouth 2 (two) times daily. 11/20/21   Arnoldo Lenis, MD  Blood Glucose Monitoring Suppl (ACCU-CHEK GUIDE) w/Device KIT 1 Units by Does not apply route 2 (two) times daily. 11/23/20   Doree Albee, MD  Colchicine 0.6 MG CAPS Take 0.6 mg by mouth daily as needed (Gout). Patient not taking: Reported on 10/22/2022    [provider]  dapagliflozin propanediol (FARXIGA) 5 MG TABS tablet Take by mouth daily.    [provider]  Dupilumab (DUPIXENT) 300 MG/2ML SOPN Inject 1 application into the skin every 14 (fourteen) days. 02/13/22   Lavonna Monarch, MD  Dupilumab (DUPIXENT) 300 MG/2ML SOPN Inject 300 mg into the skin every 14 (fourteen) days. Starting at day 15 for maintenance. 10/31/22   Ralene Bathe, MD  folic acid (FOLVITE) 482 MCG tablet Take 400 mcg by mouth daily.    [provider]  glucose blood (ACCU-CHEK GUIDE) test strip Use as instructed 01/11/21  Doree Albee, MD  hydrALAZINE (APRESOLINE) 50 MG tablet Take 100 mg by mouth 3 (three) times daily. 10/14/22 10/14/23  [provider]  Lancets (ACCU-CHEK SOFT TOUCH) lancets Use as instructed 11/23/20   Doree Albee, MD  leflunomide (ARAVA) 10 MG tablet Take 1 tablet (10 mg total) by mouth daily. 01/02/23   Bo Merino, MD  nitroGLYCERIN (NITROSTAT) 0.4 MG SL tablet Place 0.4 mg under the tongue every 5 (five) minutes as needed for chest pain.  08/03/19   [provider]  omeprazole (PRILOSEC) 40 MG capsule Take 1 capsule (40 mg total) by mouth daily. 04/03/22   Harvel Quale, MD  rosuvastatin (CRESTOR) 40 MG tablet TAKE 1 TABLET EVERY DAY . STOP ATORVASTATIN 10/22/22   Arnoldo Lenis, MD  triamcinolone cream (KENALOG) 0.1 % APPLY 1 APPLICATION TOPICALLY DAILY AS NEEDED (RASH). Patient taking differently: Apply 1 application  topically daily. 02/18/22   Lavonna Monarch, MD    Physical Exam    Vital Signs:  [name] does not have  vital signs available for review today.  Given telephonic nature of communication, physical exam is limited. AAOx3. NAD. Normal affect.  Speech and respirations are unlabored.  Accessory Clinical Findings    None  Assessment & Plan    1.  Preoperative Cardiovascular Risk Assessment:  Jacob Farmer perioperative risk of a major cardiac event is 6.6% according to the Revised Cardiac Risk Index (RCRI).  Therefore, he is at high risk for perioperative complications.   His functional capacity is fair at 5.07 METs according to the Duke Activity Status Index (DASI). Recommendations: According to ACC/AHA guidelines, no further cardiovascular testing needed.  The patient may proceed to surgery at acceptable risk.   Antiplatelet and/or Anticoagulation Recommendations:  Eliquis (Apixaban) can be held for 2-3 days prior to surgery.  Please resume post op when felt to be safe.     A copy of this note will be routed to requesting surgeon.  Time:   Today, I have spent 6 minutes with the patient with telehealth technology discussing medical history, symptoms, and management plan.     Elgie Collard, PA-C  01/09/2023, 10:11 AM

## 2023-01-09 NOTE — Patient Instructions (Signed)
Standing Labs We placed an order today for your standing lab work.   Please have your standing labs drawn in April and every 3 months   Please have your labs drawn 2 weeks prior to your appointment so that the provider can discuss your lab results at your appointment.  Please note that you may see your imaging and lab results in MyChart before we have reviewed them. We will contact you once all results are reviewed. Please allow our office up to 72 hours to thoroughly review all of the results before contacting the office for clarification of your results.  Lab hours are:   Monday through Thursday from 8:00 am -12:30 pm and 1:00 pm-5:00 pm and Friday from 8:00 am-12:00 pm.  Please be advised, all patients with office appointments requiring lab work will take precedent over walk-in lab work.   Labs are drawn by [facility]. Please bring your co-pay at the time of your lab draw.  You may receive a bill from [facility] for your lab work.  Please note if you are on Hydroxychloroquine and and an order has been placed for a Hydroxychloroquine level, you will need to have it drawn 4 hours or more after your last dose.  If you wish to have your labs drawn at another location, please call the office 24 hours in advance so we can fax the orders.  The office is located at [address], [address], [place], [place] [address] No appointment is necessary.    If you have any questions regarding directions or hours of operation,  please call [phone].   As a reminder, please drink plenty of water prior to coming for your lab work. Thanks!   Vaccines You are taking a medication(s) that can suppress your immune system.  The following immunizations are recommended: Flu annually Covid-19  Td/Tdap (tetanus, diphtheria, pertussis) every 10 years Pneumonia (Prevnar 15 then Pneumovax 23 at least 1 year apart.  Alternatively, can take Prevnar 20 without needing additional dose) Shingrix: 2 doses from 4 weeks  to 6 months apart  Please check with your PCP to make sure you are up to date.   If you have signs or symptoms of an infection or start antibiotics: First, call your PCP for workup of your infection. Hold your medication through the infection, until you complete your antibiotics, and until symptoms resolve if you take the following: Injectable medication (Actemra, Benlysta, Cimzia, Cosentyx, Enbrel, Humira, Kevzara, Orencia, Remicade, Simponi, Stelara, Taltz, Tremfya) Methotrexate Leflunomide (Arava) Mycophenolate (Cellcept) Xeljanz, Olumiant, or Rinvoq  

## 2023-01-13 ENCOUNTER — Other Ambulatory Visit: Payer: Self-pay | Admitting: Podiatry

## 2023-01-13 ENCOUNTER — Ambulatory Visit (INDEPENDENT_AMBULATORY_CARE_PROVIDER_SITE_OTHER): Payer: Medicare HMO | Admitting: Gastroenterology

## 2023-01-13 ENCOUNTER — Encounter: Payer: Self-pay | Admitting: Podiatry

## 2023-01-13 DIAGNOSIS — M205X2 Other deformities of toe(s) (acquired), left foot: Secondary | ICD-10-CM | POA: Diagnosis not present

## 2023-01-13 DIAGNOSIS — M21542 Acquired clubfoot, left foot: Secondary | ICD-10-CM | POA: Diagnosis not present

## 2023-01-13 MED ORDER — OXYCODONE-ACETAMINOPHEN 5-325 MG PO TABS: 1.0000 | ORAL_TABLET | ORAL | 0 refills | Status: DC | PRN

## 2023-01-17 DIAGNOSIS — I2510 Atherosclerotic heart disease of native coronary artery without angina pectoris: Secondary | ICD-10-CM | POA: Diagnosis not present

## 2023-01-17 DIAGNOSIS — I119 Hypertensive heart disease without heart failure: Secondary | ICD-10-CM | POA: Diagnosis not present

## 2023-01-17 DIAGNOSIS — I252 Old myocardial infarction: Secondary | ICD-10-CM | POA: Diagnosis not present

## 2023-01-17 DIAGNOSIS — C61 Malignant neoplasm of prostate: Secondary | ICD-10-CM | POA: Diagnosis not present

## 2023-01-17 DIAGNOSIS — M1990 Unspecified osteoarthritis, unspecified site: Secondary | ICD-10-CM | POA: Diagnosis not present

## 2023-01-17 DIAGNOSIS — N184 Chronic kidney disease, stage 4 (severe): Secondary | ICD-10-CM | POA: Diagnosis not present

## 2023-01-17 DIAGNOSIS — M069 Rheumatoid arthritis, unspecified: Secondary | ICD-10-CM | POA: Diagnosis not present

## 2023-01-17 DIAGNOSIS — Z4789 Encounter for other orthopedic aftercare: Secondary | ICD-10-CM | POA: Diagnosis not present

## 2023-01-17 DIAGNOSIS — E785 Hyperlipidemia, unspecified: Secondary | ICD-10-CM | POA: Diagnosis not present

## 2023-01-20 ENCOUNTER — Other Ambulatory Visit: Payer: Self-pay

## 2023-01-20 DIAGNOSIS — M1990 Unspecified osteoarthritis, unspecified site: Secondary | ICD-10-CM | POA: Diagnosis not present

## 2023-01-20 DIAGNOSIS — M069 Rheumatoid arthritis, unspecified: Secondary | ICD-10-CM | POA: Diagnosis not present

## 2023-01-20 DIAGNOSIS — I2510 Atherosclerotic heart disease of native coronary artery without angina pectoris: Secondary | ICD-10-CM | POA: Diagnosis not present

## 2023-01-20 DIAGNOSIS — N184 Chronic kidney disease, stage 4 (severe): Secondary | ICD-10-CM | POA: Diagnosis not present

## 2023-01-20 DIAGNOSIS — I119 Hypertensive heart disease without heart failure: Secondary | ICD-10-CM | POA: Diagnosis not present

## 2023-01-20 DIAGNOSIS — E785 Hyperlipidemia, unspecified: Secondary | ICD-10-CM | POA: Diagnosis not present

## 2023-01-20 DIAGNOSIS — L209 Atopic dermatitis, unspecified: Secondary | ICD-10-CM

## 2023-01-20 DIAGNOSIS — L2081 Atopic neurodermatitis: Secondary | ICD-10-CM

## 2023-01-20 DIAGNOSIS — Z4789 Encounter for other orthopedic aftercare: Secondary | ICD-10-CM | POA: Diagnosis not present

## 2023-01-20 DIAGNOSIS — C61 Malignant neoplasm of prostate: Secondary | ICD-10-CM | POA: Diagnosis not present

## 2023-01-20 DIAGNOSIS — I252 Old myocardial infarction: Secondary | ICD-10-CM | POA: Diagnosis not present

## 2023-01-20 MED ORDER — DUPIXENT 300 MG/2ML ~~LOC~~ SOAJ: 300.0000 mg | SUBCUTANEOUS | 0 refills | Status: DC

## 2023-01-21 ENCOUNTER — Ambulatory Visit (INDEPENDENT_AMBULATORY_CARE_PROVIDER_SITE_OTHER): Payer: Medicare HMO

## 2023-01-21 ENCOUNTER — Other Ambulatory Visit: Payer: Self-pay | Admitting: Cardiology

## 2023-01-21 ENCOUNTER — Ambulatory Visit (INDEPENDENT_AMBULATORY_CARE_PROVIDER_SITE_OTHER): Payer: Medicare HMO | Admitting: Podiatry

## 2023-01-21 VITALS — BP 150/80

## 2023-01-21 DIAGNOSIS — M216X2 Other acquired deformities of left foot: Secondary | ICD-10-CM | POA: Diagnosis not present

## 2023-01-21 DIAGNOSIS — Z9889 Other specified postprocedural states: Secondary | ICD-10-CM

## 2023-01-21 NOTE — Progress Notes (Signed)
Subjective:  Patient ID: Jacob Farmer, male    DOB: 05/04/1946,  MRN: [id number]  Chief Complaint  Patient presents with   Routine Post Op    POV #1 DOS 01/13/23 --- 5TH LEFT FLOATING OSTEOTOMY    DOS: 01/13/2023 Procedure: Left fifth metatarsal floating osteotomy  77 y.o. male returns for post-op check.  Patient states he is doing well very minimal pain no pain medication taken.  Weightbearing as tolerated in surgical shoe.  Bandages clean dry and intact  Review of Systems: Negative except as noted in the HPI. Denies N/V/F/Ch.  Past Medical History:  Diagnosis Date   CAD (coronary artery disease)    Acute myocardial infarction treated with TPA in 1990; 1999-stents to circumflex and RCA; residual total occlusion of the left anterior descending; normal ejection fraction; stress nuclear in 2001-distal anteroseptal ischemia; normal LV function   CKD (chronic kidney disease) stage 4, GFR 15-29 ml/min (HCC)    First degree heart block    History of gastric ulcer    History of kidney stones    History of MI (myocardial infarction)    1990-  treated w/ TPA   Hyperlipidemia    Hypertension    Jaundice    OA (osteoarthritis)    Prediabetes    Prostate cancer (HCC)    Stage T1c , Gleason 3+4,  PSA 4.7,  vol 24cc--  scheduled for radiative seed implants   Psoriatic arthritis (HCC)    RA (rheumatoid arthritis) ([place])    Dr. Toni Amend   Sinus bradycardia    Wears dentures     Current Outpatient Medications:    Alcohol Swabs (DROPSAFE ALCOHOL PREP) 70 % PADS, USE AS NEEDED TO CLEAN SKIN PRIOR TO FINGERPRICK AND INJECTION, Disp: 100 each, Rfl: 5   allopurinol (ZYLOPRIM) 100 MG tablet, TAKE 1/2 TABLET EVERY OTHER DAY, Disp: 45 tablet, Rfl: 0   amLODipine (NORVASC) 10 MG tablet, Take 1 tablet (10 mg total) by mouth daily., Disp: 90 tablet, Rfl: 3   apixaban (ELIQUIS) 5 MG TABS tablet, Take 1 tablet (5 mg total) by mouth 2 (two) times daily., Disp: 180 tablet, Rfl: 3   Blood Glucose  Monitoring Suppl (ACCU-CHEK GUIDE) w/Device KIT, 1 Units by Does not apply route 2 (two) times daily., Disp: 1 kit, Rfl: 3   Colchicine 0.6 MG CAPS, Take 0.6 mg by mouth daily as needed (Gout). (Patient not taking: Reported on 10/22/2022), Disp: , Rfl:    dapagliflozin propanediol (FARXIGA) 5 MG TABS tablet, Take by mouth daily. (Patient not taking: Reported on 01/09/2023), Disp: , Rfl:    Dupilumab (DUPIXENT) 300 MG/2ML SOPN, Inject 1 application into the skin every 14 (fourteen) days., Disp: 300 mL, Rfl: 4   Dupilumab (DUPIXENT) 300 MG/2ML SOPN, Inject 300 mg into the skin every 14 (fourteen) days. Starting at day 15 for maintenance., Disp: 4 mL, Rfl: 0   folic acid (FOLVITE) 128 MCG tablet, Take 400 mcg by mouth daily., Disp: , Rfl:    glucose blood (ACCU-CHEK GUIDE) test strip, Use as instructed, Disp: 100 each, Rfl: 12   hydrALAZINE (APRESOLINE) 50 MG tablet, Take 100 mg by mouth 3 (three) times daily., Disp: , Rfl:    Lancets (ACCU-CHEK SOFT TOUCH) lancets, Use as instructed, Disp: 100 each, Rfl: 12   leflunomide (ARAVA) 10 MG tablet, Take 1 tablet (10 mg total) by mouth daily., Disp: 90 tablet, Rfl: 0   nitroGLYCERIN (NITROSTAT) 0.4 MG SL tablet, Place 0.4 mg under the tongue every 5 (five) minutes  as needed for chest pain. , Disp: , Rfl:    omeprazole (PRILOSEC) 40 MG capsule, Take 1 capsule (40 mg total) by mouth daily. (Patient not taking: Reported on 01/09/2023), Disp: 90 capsule, Rfl: 3   oxyCODONE-acetaminophen (PERCOCET) 5-325 MG tablet, Take 1 tablet by mouth every 4 (four) hours as needed for severe pain., Disp: 30 tablet, Rfl: 0   rosuvastatin (CRESTOR) 40 MG tablet, TAKE 1 TABLET EVERY DAY. STOP ATORVASTATIN, Disp: 90 tablet, Rfl: 3   triamcinolone cream (KENALOG) 0.1 %, APPLY 1 APPLICATION TOPICALLY DAILY AS NEEDED (RASH). (Patient taking differently: Apply 1 application  topically daily.), Disp: 454 g, Rfl: 3  Social History   Tobacco Use  Smoking Status Former   Packs/day: 1.00    Years: 30.00   Total pack years: 30.00   Types: Cigarettes   Quit date: 05/21/1989   Years since quitting: 33.6   Passive exposure: Never  Smokeless Tobacco Never    No Known Allergies Objective:   Vitals:   01/21/23 0822  BP: (!) 150/80   There is no height or weight on file to calculate BMI. Constitutional Well developed. Well nourished.  Vascular Foot warm and well perfused. Capillary refill normal to all digits.   Neurologic Normal speech. Oriented to person, place, and time. Epicritic sensation to light touch grossly present bilaterally.  Dermatologic Skin healing well without signs of infection. Skin edges well coapted without signs of infection.  Orthopedic: Tenderness to palpation noted about the surgical site.   Radiographs: 3 views of skeletally mature adult left foot: Good correction alignment noted osteotomy of the fifth metatarsal noted. Assessment:   1. Plantar flexed metatarsal bone of left foot   2. Status post foot surgery    Plan:  Patient was evaluated and treated and all questions answered.  S/p foot surgery left -Progressing as expected post-operatively. -XR: See above -WB Status: Weightbearing as tolerated in cam -Sutures: Intact.  Clinic no clinical signs of [place] is noted no complication noted. -Medications: None -Foot redressed.  No follow-ups on file.

## 2023-01-23 DIAGNOSIS — M069 Rheumatoid arthritis, unspecified: Secondary | ICD-10-CM | POA: Diagnosis not present

## 2023-01-23 DIAGNOSIS — E785 Hyperlipidemia, unspecified: Secondary | ICD-10-CM | POA: Diagnosis not present

## 2023-01-23 DIAGNOSIS — Z4789 Encounter for other orthopedic aftercare: Secondary | ICD-10-CM | POA: Diagnosis not present

## 2023-01-23 DIAGNOSIS — C61 Malignant neoplasm of prostate: Secondary | ICD-10-CM | POA: Diagnosis not present

## 2023-01-23 DIAGNOSIS — I252 Old myocardial infarction: Secondary | ICD-10-CM | POA: Diagnosis not present

## 2023-01-23 DIAGNOSIS — M1990 Unspecified osteoarthritis, unspecified site: Secondary | ICD-10-CM | POA: Diagnosis not present

## 2023-01-23 DIAGNOSIS — I119 Hypertensive heart disease without heart failure: Secondary | ICD-10-CM | POA: Diagnosis not present

## 2023-01-23 DIAGNOSIS — N184 Chronic kidney disease, stage 4 (severe): Secondary | ICD-10-CM | POA: Diagnosis not present

## 2023-01-23 DIAGNOSIS — I2510 Atherosclerotic heart disease of native coronary artery without angina pectoris: Secondary | ICD-10-CM | POA: Diagnosis not present

## 2023-01-27 ENCOUNTER — Ambulatory Visit (INDEPENDENT_AMBULATORY_CARE_PROVIDER_SITE_OTHER): Payer: Medicare HMO | Admitting: Internal Medicine

## 2023-01-27 ENCOUNTER — Encounter: Payer: Self-pay | Admitting: Internal Medicine

## 2023-01-27 VITALS — BP 139/69 | HR 76 | Ht 70.0 in | Wt 183.6 lb

## 2023-01-27 DIAGNOSIS — E782 Mixed hyperlipidemia: Secondary | ICD-10-CM | POA: Diagnosis not present

## 2023-01-27 DIAGNOSIS — Z0001 Encounter for general adult medical examination with abnormal findings: Secondary | ICD-10-CM | POA: Diagnosis not present

## 2023-01-27 DIAGNOSIS — I480 Paroxysmal atrial fibrillation: Secondary | ICD-10-CM

## 2023-01-27 DIAGNOSIS — E1122 Type 2 diabetes mellitus with diabetic chronic kidney disease: Secondary | ICD-10-CM

## 2023-01-27 DIAGNOSIS — N184 Chronic kidney disease, stage 4 (severe): Secondary | ICD-10-CM | POA: Diagnosis not present

## 2023-01-27 DIAGNOSIS — M0600 Rheumatoid arthritis without rheumatoid factor, unspecified site: Secondary | ICD-10-CM | POA: Diagnosis not present

## 2023-01-27 DIAGNOSIS — I10 Essential (primary) hypertension: Secondary | ICD-10-CM

## 2023-01-27 DIAGNOSIS — Z8546 Personal history of malignant neoplasm of prostate: Secondary | ICD-10-CM

## 2023-01-27 NOTE — Progress Notes (Signed)
Established Patient Office Visit  Subjective:  Patient ID: Jacob Farmer, male    DOB: 12-27-1945  Age: 77 y.o. MRN: [id number]  CC:  Chief Complaint  Patient presents with   Annual Exam    HPI [name] is a 77 y.o. male with past medical history of  CAD status post stent placement, HTN, HLD, RA, CKD stage IV, prostate ca. s/p radiotherapy, gout, cervical spinal stenosis and GERD who presents for annual physical.  CAD s/p stent placement and HTN: BP is well-controlled. Takes medications regularly. Patient denies headache, dizziness, chest pain, dyspnea or palpitations.  He follows up with cardiology.  He is on aspirin and statin.  He was diagnosed with A Fib, and has been placed on Eliquis now.  Denies any signs of bleeding currently.   RA: He is on [place] for it. He goes to rheumatology clinic.  He also takes allopurinol for history of gout, denies any recent flareups.   CKD stage IV: He was on losartan and Farxiga, but had to stop it due to AKI.  He follows up with nephrology for it.  Denies any dysuria or hematuria currently.  He has a history of allergic dermatitis, for which he takes [place] and sees Dr. Denna Haggard.  Past Medical History:  Diagnosis Date   CAD (coronary artery disease)    Acute myocardial infarction treated with TPA in 1990; 1999-stents to circumflex and RCA; residual total occlusion of the left anterior descending; normal ejection fraction; stress nuclear in 2001-distal anteroseptal ischemia; normal LV function   CKD (chronic kidney disease) stage 4, GFR 15-29 ml/min (HCC)    First degree heart block    History of gastric ulcer    History of kidney stones    History of MI (myocardial infarction)    1990-  treated w/ TPA   Hyperlipidemia    Hypertension    Jaundice    OA (osteoarthritis)    Prediabetes    Prostate cancer ([place])    Stage T1c , Gleason 3+4,  PSA 4.7,  vol 24cc--  scheduled for radiative seed implants   Psoriatic arthritis ([place])    RA  (rheumatoid arthritis) ([place])    Dr. Toni Amend   Sinus bradycardia    Wears dentures     Past Surgical History:  Procedure Laterality Date   BIOPSY  08/27/2019   Procedure: BIOPSY;  Surgeon: Rogene Houston, MD;  [facility];  Service: Endoscopy;;  gastric   BIOPSY  04/03/2022   Procedure: BIOPSY;  Surgeon: Montez Morita, Quillian Quince, MD;  [facility];  Service: Gastroenterology;;   callus removal Left 09/13/2022   left foot   CARDIOVASCULAR STRESS TEST  2001  per Dr Lattie Haw clinic note   distal anteroseptal ischemia,  normal LVF   COLONOSCOPY  last one 2006 (approx)   COLONOSCOPY WITH PROPOFOL N/A 04/03/2022   Procedure: COLONOSCOPY WITH PROPOFOL;  Surgeon: Harvel Quale, MD;  [facility];  Service: Gastroenterology;  Laterality: N/A;  [place]   DES x2  to CFX and RCA/  residual total occlusion LAD,  normal LVEF   ESOPHAGOGASTRODUODENOSCOPY (EGD) WITH PROPOFOL N/A 08/27/2019   Procedure: ESOPHAGOGASTRODUODENOSCOPY (EGD) WITH PROPOFOL;  Surgeon: Rogene Houston, MD;  [facility];  Service: Endoscopy;  Laterality: N/A;  10:10   ESOPHAGOGASTRODUODENOSCOPY (EGD) WITH PROPOFOL N/A 04/03/2022   Procedure: ESOPHAGOGASTRODUODENOSCOPY (EGD) WITH PROPOFOL;  Surgeon: Harvel Quale, MD;  [facility];  Service: Gastroenterology;  Laterality: N/A;   EYE SURGERY Bilateral    cataract   HOT HEMOSTASIS  04/03/2022   Procedure: HOT HEMOSTASIS (ARGON PLASMA COAGULATION/BICAP);  Surgeon: Montez Morita, Quillian Quince, MD;  [facility];  Service: Gastroenterology;;   POLYPECTOMY  04/03/2022   Procedure: POLYPECTOMY;  Surgeon: Harvel Quale, MD;  [facility];  Service: Gastroenterology;;   POSTERIOR CERVICAL FUSION/FORAMINOTOMY N/A 05/26/2017   Procedure: RIGHT C4-5 FORAMINOTOMY WITH EXCISION OF HERNIATED [place];  Surgeon: Jessy Oto, MD;   Location: [facility];  Service: Orthopedics;  Laterality: N/A;   RADIOACTIVE SEED IMPLANT N/A 01/11/2016   Procedure: RADIOACTIVE SEED IMPLANT/BRACHYTHERAPY IMPLANT;  Surgeon: Franchot Gallo, MD;  Location: [facility];  Service: Urology;  Laterality: N/A;   33  seeds implanted no seeds founds in bladder    Family History  Problem Relation Age of Onset   Heart attack Mother    Coronary artery disease Father    Ulcerative colitis Father    Ulcers Father    Breast cancer Sister    COPD Brother    Pancreatic cancer Brother    Healthy Sister    Diabetes Brother    Cirrhosis Son    Seizures Son     Social History   Socioeconomic History   Marital status: Married    Spouse name: Not on file   Number of children: 2   Years of education: Not on file   Highest education level: Not on file  Occupational History   Occupation: Retired Clinical biochemist  Tobacco Use   Smoking status: Former    Packs/day: 1.00    Years: 30.00    Total pack years: 30.00    Types: Cigarettes    Quit date: 05/21/1989    Years since quitting: 33.7    Passive exposure: Never   Smokeless tobacco: Never  Vaping Use   Vaping Use: Never used  Substance and Sexual Activity   Alcohol use: Yes    Comment: 1 beer and 1 mix drink per day   Drug use: No   Sexual activity: Not on file  Other Topics Concern   Not on file  Social History Narrative   Married for 24 years.Retired Clinical biochemist.   Social Determinants of Health   Financial Resource Strain: Low Risk  (09/14/2021)   Overall Financial Resource Strain (CARDIA)    Difficulty of Paying Living Expenses: Not hard at all  Food Insecurity: No Food Insecurity (09/14/2021)   Hunger Vital Sign    Worried About Running Out of Food in the Last Year: Never true    Ran Out of Food in the Last Year: Never true  Transportation Needs: No Transportation Needs (09/14/2021)   PRAPARE - Hydrologist (Medical): No    Lack of  Transportation (Non-Medical): No  Physical Activity: Sufficiently Active (09/14/2021)   Exercise Vital Sign    Days of Exercise per Week: 7 days    Minutes of Exercise per Session: 60 min  Stress: No Stress Concern Present (09/14/2021)   [place]    Feeling of Stress : Not at all  Social Connections: Moderately Integrated (09/14/2021)   Social Connection and Isolation Panel [NHANES]    Frequency of Communication with Friends and Family: More than three times a week    Frequency of Social Gatherings with Friends and Family: More than three times a week    Attends  Religious Services: More than 4 times per year    Active Member of Clubs or Organizations: No    Attends Archivist Meetings: Never    Marital Status: Married  Human resources officer Violence: Not At Risk (09/14/2021)   Humiliation, Afraid, Rape, and Kick questionnaire    Fear of Current or Ex-Partner: No    Emotionally Abused: No    Physically Abused: No    Sexually Abused: No    Outpatient Medications Prior to Visit  Medication Sig Dispense Refill   Alcohol Swabs (DROPSAFE ALCOHOL PREP) 70 % PADS USE AS NEEDED TO CLEAN SKIN PRIOR TO FINGERPRICK AND INJECTION 100 each 5   allopurinol (ZYLOPRIM) 100 MG tablet TAKE 1/2 TABLET EVERY OTHER DAY 45 tablet 0   amLODipine (NORVASC) 10 MG tablet Take 1 tablet (10 mg total) by mouth daily. 90 tablet 3   apixaban (ELIQUIS) 5 MG TABS tablet Take 1 tablet (5 mg total) by mouth 2 (two) times daily. 180 tablet 3   Blood Glucose Monitoring Suppl (ACCU-CHEK GUIDE) w/Device KIT 1 Units by Does not apply route 2 (two) times daily. 1 kit 3   Colchicine 0.6 MG CAPS Take 0.6 mg by mouth daily as needed (Gout).     Dupilumab (DUPIXENT) 300 MG/2ML SOPN Inject 1 application into the skin every 14 (fourteen) days. 300 mL 4   Dupilumab (DUPIXENT) 300 MG/2ML SOPN Inject 300 mg into the skin every 14 (fourteen) days. Starting at day 15  for maintenance. 4 mL 0   folic acid (FOLVITE) A999333 MCG tablet Take 400 mcg by mouth daily.     glucose blood (ACCU-CHEK GUIDE) test strip Use as instructed 100 each 12   hydrALAZINE (APRESOLINE) 50 MG tablet Take 100 mg by mouth 2 (two) times daily.     Lancets (ACCU-CHEK SOFT TOUCH) lancets Use as instructed 100 each 12   leflunomide (ARAVA) 10 MG tablet Take 1 tablet (10 mg total) by mouth daily. 90 tablet 0   nitroGLYCERIN (NITROSTAT) 0.4 MG SL tablet Place 0.4 mg under the tongue every 5 (five) minutes as needed for chest pain.      omeprazole (PRILOSEC) 40 MG capsule Take 1 capsule (40 mg total) by mouth daily. (Patient not taking: Reported on 01/09/2023) 90 capsule 3   oxyCODONE-acetaminophen (PERCOCET) 5-325 MG tablet Take 1 tablet by mouth every 4 (four) hours as needed for severe pain. 30 tablet 0   rosuvastatin (CRESTOR) 40 MG tablet TAKE 1 TABLET EVERY DAY. STOP ATORVASTATIN 90 tablet 3   triamcinolone cream (KENALOG) 0.1 % APPLY 1 APPLICATION TOPICALLY DAILY AS NEEDED (RASH). (Patient taking differently: Apply 1 application  topically daily.) 454 g 3   dapagliflozin propanediol (FARXIGA) 5 MG TABS tablet Take by mouth daily. (Patient not taking: Reported on 01/09/2023)     No facility-administered medications prior to visit.    No Known Allergies  ROS Review of Systems  Constitutional:  Negative for chills and fever.  HENT:  Negative for congestion and sore throat.   Eyes:  Negative for pain and discharge.  Respiratory:  Negative for cough and shortness of breath.   Cardiovascular:  Negative for chest pain and palpitations.  Gastrointestinal:  Negative for constipation, diarrhea, nausea and vomiting.  Endocrine: Negative for polydipsia and polyuria.  Genitourinary:  Negative for dysuria and hematuria.  Musculoskeletal:  Positive for arthralgias and back pain. Negative for neck pain and neck stiffness.  Skin:  Positive for rash.  Neurological:  Negative for dizziness, weakness,  numbness  and headaches.  Psychiatric/Behavioral:  Negative for agitation and behavioral problems.       Objective:    Physical Exam Vitals reviewed.  Constitutional:      General: He is not in acute distress.    Appearance: He is not diaphoretic.  HENT:     Head: Normocephalic and atraumatic.     Nose: Nose normal.     Mouth/Throat:     Mouth: Mucous membranes are moist.  Eyes:     General: No scleral icterus.    Extraocular Movements: Extraocular movements intact.  Cardiovascular:     Rate and Rhythm: Normal rate and regular rhythm.     Pulses: Normal pulses.     Heart sounds: Normal heart sounds. No murmur heard. Pulmonary:     Breath sounds: Normal breath sounds. No wheezing or rales.  Abdominal:     Palpations: Abdomen is soft.     Tenderness: There is no abdominal tenderness.  Musculoskeletal:     Cervical back: Neck supple. No tenderness.     Right lower leg: No edema.     Left lower leg: No edema.  Feet:     Right foot:     Toenail Condition: Right toenails are abnormally thick. Fungal disease present.    Left foot:     Toenail Condition: Left toenails are abnormally thick. Fungal disease present. Skin:    General: Skin is warm.     Findings: Rash (Diffuse eczematous and dry, scaly patches over LE) present.  Neurological:     General: No focal deficit present.     Mental Status: He is alert and oriented to person, place, and time.     Cranial Nerves: No cranial nerve deficit.     Sensory: No sensory deficit.     Motor: No weakness.  Psychiatric:        Mood and Affect: Mood normal.        Behavior: Behavior normal.     BP 139/69 (BP Location: Left Arm, Patient Position: Sitting, Cuff Size: Large)   Pulse 76   Ht 5' 10"$  (1.778 m)   Wt 183 lb 9.6 oz (83.3 kg)   SpO2 96%   BMI 26.34 kg/m  Wt Readings from Last 3 Encounters:  01/27/23 183 lb 9.6 oz (83.3 kg)  01/09/23 187 lb (84.8 kg)  12/25/22 186 lb 12.8 oz (84.7 kg)    Lab Results  Component  Value Date   TSH 2.52 08/08/2020   Lab Results  Component Value Date   WBC 6.6 01/01/2023   HGB 11.9 (L) 01/01/2023   HCT 36.8 (L) 01/01/2023   MCV 87.6 01/01/2023   PLT 163 01/01/2023   Lab Results  Component Value Date   NA 142 01/01/2023   K 4.0 01/01/2023   CO2 20 01/01/2023   GLUCOSE 107 (H) 01/01/2023   BUN 33 (H) 01/01/2023   CREATININE 3.50 (H) 01/01/2023   BILITOT 0.4 01/01/2023   ALKPHOS 254 (H) 11/06/2020   AST 23 01/01/2023   ALT 19 01/01/2023   PROT 6.8 01/01/2023   ALBUMIN 2.9 (L) 11/06/2020   CALCIUM 8.7 01/01/2023   ANIONGAP 8 11/02/2020   EGFR 17 (L) 01/01/2023   Lab Results  Component Value Date   CHOL 113 06/14/2021   Lab Results  Component Value Date   HDL 49 06/14/2021   Lab Results  Component Value Date   LDLCALC 48 06/14/2021   Lab Results  Component Value Date   TRIG 82 06/14/2021  Lab Results  Component Value Date   CHOLHDL 2.3 06/14/2021   Lab Results  Component Value Date   HGBA1C 5.5 07/22/2022   HGBA1C 5.5 07/22/2022      Assessment & Plan:   Problem List Items Addressed This Visit       Cardiovascular and Mediastinum   Essential hypertension    BP Readings from Last 1 Encounters:  01/27/23 139/69  Well-controlled with Amlodipine and hydralazine Losartan has been discontinued by Nephrology Counseled for compliance with the medications Advised DASH diet and moderate exercise/walking as tolerated      Relevant Orders   TSH   Paroxysmal atrial fibrillation (HCC)    In sinus rhythm currently, rate controlled On Eliquis now Not on rate-controlling agent due to bradycardia      Relevant Orders   TSH     Endocrine   Type 2 diabetes mellitus with diabetic chronic kidney disease ([place])    Lab Results  Component Value Date   HGBA1C 5.5 07/22/2022   HGBA1C 5.5 07/22/2022  Diet controlled Advised to follow diabetic diet On statin and ARB F/u CMP and lipid panel Diabetic eye exam: Advised to follow up with  Ophthalmology for diabetic eye exam      Relevant Orders   Hemoglobin A1c     Musculoskeletal and Integument   Rheumatoid arthritis ([place])    On Arava Followed by Rheumatology      Relevant Orders   TSH     Genitourinary   Chronic kidney disease, stage 4 (severe) ([place])    Avoid nephrotoxic agents including NSAIDs Followed by nephrology      Relevant Orders   TSH     Other   Hyperlipidemia    On Crestor Check lipid profile      Relevant Orders   Lipid Profile   History of prostate cancer    S/p radiotherapy Check PSA      Relevant Orders   PSA   Encounter for general adult medical examination with abnormal findings - Primary    Physical exam as documented. Counseling done  re healthy lifestyle involving commitment to 150 minutes exercise per week, heart healthy diet, and attaining healthy weight.The importance of adequate sleep also discussed. Changes in health habits are decided on by the patient with goals and time frames  set for achieving them. Immunization and cancer screening needs are specifically addressed at this visit.       No orders of the defined types were placed in this encounter.   Follow-up: Return in about 6 months (around 07/28/2023) for DM and CAD.    Lindell Spar, MD

## 2023-01-27 NOTE — Assessment & Plan Note (Signed)
Lab Results  Component Value Date   HGBA1C 5.5 07/22/2022   HGBA1C 5.5 07/22/2022   Diet controlled Advised to follow diabetic diet On statin and ARB F/u CMP and lipid panel Diabetic eye exam: Advised to follow up with Ophthalmology for diabetic eye exam

## 2023-01-27 NOTE — Assessment & Plan Note (Signed)
BP Readings from Last 1 Encounters:  01/27/23 139/69   Well-controlled with Amlodipine and hydralazine Losartan has been discontinued by Nephrology Counseled for compliance with the medications Advised DASH diet and moderate exercise/walking as tolerated

## 2023-01-27 NOTE — Assessment & Plan Note (Addendum)
In sinus rhythm currently, rate controlled On Eliquis now Not on rate-controlling agent due to bradycardia

## 2023-01-27 NOTE — Patient Instructions (Signed)
Please continue to take medications as prescribed.  Please continue to follow low carb diet and ambulate as tolerated.

## 2023-01-27 NOTE — Assessment & Plan Note (Signed)
On Arava Followed by Rheumatology

## 2023-01-27 NOTE — Assessment & Plan Note (Signed)

## 2023-01-27 NOTE — Assessment & Plan Note (Signed)
On Crestor Check lipid profile

## 2023-01-27 NOTE — Assessment & Plan Note (Signed)
S/p radiotherapy Check PSA

## 2023-01-27 NOTE — Assessment & Plan Note (Signed)
Avoid nephrotoxic agents including NSAIDs Followed by nephrology

## 2023-01-31 DIAGNOSIS — E1122 Type 2 diabetes mellitus with diabetic chronic kidney disease: Secondary | ICD-10-CM | POA: Diagnosis not present

## 2023-01-31 DIAGNOSIS — N184 Chronic kidney disease, stage 4 (severe): Secondary | ICD-10-CM | POA: Diagnosis not present

## 2023-01-31 DIAGNOSIS — Z8546 Personal history of malignant neoplasm of prostate: Secondary | ICD-10-CM | POA: Diagnosis not present

## 2023-01-31 DIAGNOSIS — I129 Hypertensive chronic kidney disease with stage 1 through stage 4 chronic kidney disease, or unspecified chronic kidney disease: Secondary | ICD-10-CM | POA: Diagnosis not present

## 2023-01-31 DIAGNOSIS — E782 Mixed hyperlipidemia: Secondary | ICD-10-CM | POA: Diagnosis not present

## 2023-01-31 DIAGNOSIS — I480 Paroxysmal atrial fibrillation: Secondary | ICD-10-CM | POA: Diagnosis not present

## 2023-01-31 DIAGNOSIS — M0600 Rheumatoid arthritis without rheumatoid factor, unspecified site: Secondary | ICD-10-CM | POA: Diagnosis not present

## 2023-01-31 DIAGNOSIS — D631 Anemia in chronic kidney disease: Secondary | ICD-10-CM | POA: Diagnosis not present

## 2023-01-31 DIAGNOSIS — I5032 Chronic diastolic (congestive) heart failure: Secondary | ICD-10-CM | POA: Diagnosis not present

## 2023-01-31 DIAGNOSIS — N189 Chronic kidney disease, unspecified: Secondary | ICD-10-CM | POA: Diagnosis not present

## 2023-01-31 DIAGNOSIS — R809 Proteinuria, unspecified: Secondary | ICD-10-CM | POA: Diagnosis not present

## 2023-01-31 DIAGNOSIS — I10 Essential (primary) hypertension: Secondary | ICD-10-CM | POA: Diagnosis not present

## 2023-02-01 LAB — HEMOGLOBIN A1C
Hgb A1c MFr Bld: 5.3 % of total Hgb (ref <5.7)
Mean Plasma Glucose: 105 mg/dL
eAG (mmol/L): 5.8 mmol/L

## 2023-02-01 LAB — LIPID PANEL
Cholesterol: 111 mg/dL (ref <200)
HDL: 53 mg/dL (ref >=40)
LDL Cholesterol (Calc): 42 mg/dL (calc)
Non-HDL Cholesterol (Calc): 58 mg/dL (calc) (ref <130)
Total CHOL/HDL Ratio: 2.1 (calc) (ref <5.0)
Triglycerides: 76 mg/dL (ref <150)

## 2023-02-01 LAB — PSA: PSA: 7.49 ng/mL — ABNORMAL HIGH (ref <=4.00)

## 2023-02-01 LAB — TSH: TSH: 4.26 mIU/L (ref 0.40–4.50)

## 2023-02-04 ENCOUNTER — Ambulatory Visit (INDEPENDENT_AMBULATORY_CARE_PROVIDER_SITE_OTHER): Payer: Medicare HMO

## 2023-02-04 ENCOUNTER — Other Ambulatory Visit: Payer: Self-pay

## 2023-02-04 ENCOUNTER — Telehealth: Payer: Self-pay | Admitting: Internal Medicine

## 2023-02-04 DIAGNOSIS — R972 Elevated prostate specific antigen [PSA]: Secondary | ICD-10-CM

## 2023-02-04 DIAGNOSIS — M216X2 Other acquired deformities of left foot: Secondary | ICD-10-CM

## 2023-02-04 NOTE — Telephone Encounter (Signed)
Pt wife calling for lab results

## 2023-02-04 NOTE — Telephone Encounter (Signed)
Spoke to wife

## 2023-02-04 NOTE — Progress Notes (Signed)
Patient presents today for post op visit # 2, patient of Dr. Posey Pronto.    POV # 2 DOS 01/13/23 --- 5TH LEFT FLOATING OSTEOTOMY    Sutures removed today without complication. Patient tolerated suture removal well.  Incisions look good and no signs of infections. Patient states "I'm ready to come out of this shoe". Advised patient he can began normal activity at this time.   Reviewed icing and elevation. Patient will follow up with Dr. Posey Pronto as needed .

## 2023-02-06 ENCOUNTER — Encounter (INDEPENDENT_AMBULATORY_CARE_PROVIDER_SITE_OTHER): Payer: Self-pay | Admitting: Gastroenterology

## 2023-02-06 ENCOUNTER — Ambulatory Visit (INDEPENDENT_AMBULATORY_CARE_PROVIDER_SITE_OTHER): Payer: Medicare HMO | Admitting: Gastroenterology

## 2023-02-06 VITALS — BP 172/81 | HR 80 | Temp 98.2°F | Ht 70.0 in | Wt 185.0 lb

## 2023-02-06 DIAGNOSIS — K2970 Gastritis, unspecified, without bleeding: Secondary | ICD-10-CM | POA: Insufficient documentation

## 2023-02-06 DIAGNOSIS — K5520 Angiodysplasia of colon without hemorrhage: Secondary | ICD-10-CM | POA: Insufficient documentation

## 2023-02-06 DIAGNOSIS — I129 Hypertensive chronic kidney disease with stage 1 through stage 4 chronic kidney disease, or unspecified chronic kidney disease: Secondary | ICD-10-CM | POA: Diagnosis not present

## 2023-02-06 DIAGNOSIS — N170 Acute kidney failure with tubular necrosis: Secondary | ICD-10-CM | POA: Diagnosis not present

## 2023-02-06 DIAGNOSIS — C61 Malignant neoplasm of prostate: Secondary | ICD-10-CM | POA: Diagnosis not present

## 2023-02-06 DIAGNOSIS — D509 Iron deficiency anemia, unspecified: Secondary | ICD-10-CM

## 2023-02-06 DIAGNOSIS — I5032 Chronic diastolic (congestive) heart failure: Secondary | ICD-10-CM | POA: Diagnosis not present

## 2023-02-06 DIAGNOSIS — N189 Chronic kidney disease, unspecified: Secondary | ICD-10-CM | POA: Diagnosis not present

## 2023-02-06 DIAGNOSIS — K719 Toxic liver disease, unspecified: Secondary | ICD-10-CM

## 2023-02-06 DIAGNOSIS — K2990 Gastroduodenitis, unspecified, without bleeding: Secondary | ICD-10-CM

## 2023-02-06 DIAGNOSIS — E211 Secondary hyperparathyroidism, not elsewhere classified: Secondary | ICD-10-CM | POA: Diagnosis not present

## 2023-02-06 DIAGNOSIS — D631 Anemia in chronic kidney disease: Secondary | ICD-10-CM | POA: Diagnosis not present

## 2023-02-06 DIAGNOSIS — N184 Chronic kidney disease, stage 4 (severe): Secondary | ICD-10-CM | POA: Diagnosis not present

## 2023-02-06 DIAGNOSIS — R809 Proteinuria, unspecified: Secondary | ICD-10-CM | POA: Diagnosis not present

## 2023-02-06 MED ORDER — OMEPRAZOLE 20 MG PO CPDR: 20.0000 mg | DELAYED_RELEASE_CAPSULE | Freq: Every day | ORAL | 3 refills | Status: DC

## 2023-02-06 NOTE — Progress Notes (Addendum)
Jacob Farmer, M.D. Gastroenterology & Hepatology [facility] [address] [place], [place] [address]  Primary Care Physician: Lindell Spar, MD [address] [place] [address]  I will communicate my assessment and recommendations to the referring MD via EMR.  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 male with past medical history of MI and CAD s/p stents x2, prostate cancer s/p radiation therapy, RA, history of kidney stones, hyperlipidemia, hypertension, prediabetes, CKD stage 4 and cecal AVMs, DILI, who presents for follow up of IDA.  The patient was last seen on 02/28/2022. At that time, the patient was scheduled for EGD with findings described below.  Was advised to take oral iron every other day.  LFTs were checked which showed improvement of his aminotransferases, likely related to terbinafine use.  Patient was started on omeprazole 40 mg every day after his most recent EGD. Ran out of this medication a month ago. No heartburn, dysphagia or odynophagia.   The patient states feeling well and denies having any nausea, vomiting, fever, chills, hematochezia, melena, hematemesis, abdominal distention, abdominal pain, diarrhea, jaundice, pruritus or weight loss.  Blood workup from 01/01/2023 showed hemoglobin of 11.9 platelets 163 and WBC 6.6, CMP with BUN 33 and creatinine 3.15.  Also had CMP on same day which showed AST of 23 and ALT of 19, rest of liver panel was within normal limits. He states that he had a repeat Hb a day after having a foot surgery, so he had some blood loss at that time - repeat Hb was 9.7 on 01/31/22. He was told by his nephrologist he will have a repeat CBC in 6 weeks.  Last EGD: 04/03/2022 Erosions or erythema in the gastric body.  Duodenum and esophagus were  normal. Pathology from gastric biopsies showed reactive gastropathy negative for H. pylori.  Last Colonoscopy: 04/03/2022, results to medium sized AVMs in the cecum which were ablated, 3 polyps in the ascending colon and cecum (tubular adenomas).  No more colonoscopies recommended given age.  Past Medical History: Past Medical History:  Diagnosis Date   CAD (coronary artery disease)    Acute myocardial infarction treated with TPA in 1990; 1999-stents to circumflex and RCA; residual total occlusion of the left anterior descending; normal ejection fraction; stress nuclear in 2001-distal anteroseptal ischemia; normal LV function   CKD (chronic kidney disease) stage 4, GFR 15-29 ml/min (HCC)    First degree heart block    History of gastric ulcer    History of kidney stones    History of MI (myocardial infarction)    1990-  treated w/ TPA   Hyperlipidemia    Hypertension    Jaundice    OA (osteoarthritis)    Prediabetes    Prostate cancer (HCC)    Stage T1c , Gleason 3+4,  PSA 4.7,  vol 24cc--  scheduled for radiative seed implants   Psoriatic arthritis ([place])    RA (rheumatoid arthritis) ([place])    Dr. Toni Amend   Sinus bradycardia    Wears dentures     Past Surgical History: Past Surgical History:  Procedure Laterality Date   BIOPSY  08/27/2019   Procedure: BIOPSY;  Surgeon: Rogene Houston, MD;  [facility];  Service: Endoscopy;;  gastric   BIOPSY  04/03/2022   Procedure: BIOPSY;  Surgeon: Harvel Quale, MD;  [facility];  Service: Gastroenterology;;   callus removal Left 09/13/2022   left foot   CARDIOVASCULAR STRESS TEST  2001  per Dr Lattie Haw clinic note   distal anteroseptal ischemia,  normal LVF   COLONOSCOPY  last one 2006 (approx)   COLONOSCOPY WITH PROPOFOL N/A 04/03/2022   Procedure: COLONOSCOPY WITH PROPOFOL;  Surgeon: Harvel Quale, MD;  [facility];  Service: Gastroenterology;  Laterality: N/A;  [place]   DES x2  to CFX and RCA/  residual total occlusion LAD,  normal LVEF   ESOPHAGOGASTRODUODENOSCOPY (EGD) WITH PROPOFOL N/A 08/27/2019   Procedure: ESOPHAGOGASTRODUODENOSCOPY (EGD) WITH PROPOFOL;  Surgeon: Rogene Houston, MD;  [facility];  Service: Endoscopy;  Laterality: N/A;  10:10   ESOPHAGOGASTRODUODENOSCOPY (EGD) WITH PROPOFOL N/A 04/03/2022   Procedure: ESOPHAGOGASTRODUODENOSCOPY (EGD) WITH PROPOFOL;  Surgeon: Harvel Quale, MD;  [facility];  Service: Gastroenterology;  Laterality: N/A;   EYE SURGERY Bilateral    cataract   HOT HEMOSTASIS  04/03/2022   Procedure: HOT HEMOSTASIS (ARGON PLASMA COAGULATION/BICAP);  Surgeon: Montez Morita, Quillian Quince, MD;  [facility];  Service: Gastroenterology;;   POLYPECTOMY  04/03/2022   Procedure: POLYPECTOMY;  Surgeon: Harvel Quale, MD;  [facility];  Service: Gastroenterology;;   POSTERIOR CERVICAL FUSION/FORAMINOTOMY N/A 05/26/2017   Procedure: RIGHT C4-5 FORAMINOTOMY WITH EXCISION OF HERNIATED [place];  Surgeon: Jessy Oto, MD;  Location: [facility];  Service: Orthopedics;  Laterality: N/A;   RADIOACTIVE SEED IMPLANT N/A 01/11/2016   Procedure: RADIOACTIVE SEED IMPLANT/BRACHYTHERAPY IMPLANT;  Surgeon: Franchot Gallo, MD;  Location: [facility];  Service: Urology;  Laterality: N/A;   57  seeds implanted no seeds founds in bladder    Family History: Family History  Problem Relation Age of Onset   Heart attack Mother    Coronary artery disease Father    Ulcerative colitis Father    Ulcers Father    Breast cancer Sister    COPD Brother    Pancreatic cancer Brother    Healthy Sister    Diabetes Brother    Cirrhosis Son    Seizures Son     Social History: Social History   Tobacco Use  Smoking Status Former   Packs/day: 1.00   Years: 30.00   Total pack years: 30.00   Types: Cigarettes   Quit date:  05/21/1989   Years since quitting: 33.7   Passive exposure: Never  Smokeless Tobacco Never   Social History   Substance and Sexual Activity  Alcohol Use Yes   Comment: 1 beer and 1 mix drink per day   Social History   Substance and Sexual Activity  Drug Use No    Allergies: No Known Allergies  Medications: Current Outpatient Medications  Medication Sig Dispense Refill   Alcohol Swabs (DROPSAFE ALCOHOL PREP) 70 % PADS USE AS NEEDED TO CLEAN SKIN PRIOR TO FINGERPRICK AND INJECTION 100 each 5   allopurinol (ZYLOPRIM) 100 MG tablet TAKE 1/2 TABLET EVERY OTHER DAY 45 tablet 0   amLODipine (NORVASC) 10 MG tablet Take 1 tablet (10 mg total) by mouth daily. 90 tablet 3   apixaban (ELIQUIS) 5 MG TABS tablet Take 1 tablet (5 mg total) by mouth 2 (two) times daily. 180 tablet 3   Blood Glucose Monitoring Suppl (ACCU-CHEK GUIDE) w/Device KIT 1 Units by Does not apply route 2 (two) times daily. 1 kit 3   Colchicine 0.6 MG  CAPS Take 0.6 mg by mouth daily as needed (Gout).     Dupilumab (DUPIXENT) 300 MG/2ML SOPN Inject 1 application into the skin every 14 (fourteen) days. 300 mL 4   Dupilumab (DUPIXENT) 300 MG/2ML SOPN Inject 300 mg into the skin every 14 (fourteen) days. Starting at day 15 for maintenance. 4 mL 0   folic acid (FOLVITE) A999333 MCG tablet Take 400 mcg by mouth daily.     glucose blood (ACCU-CHEK GUIDE) test strip Use as instructed 100 each 12   hydrALAZINE (APRESOLINE) 50 MG tablet Take 100 mg by mouth 2 (two) times daily.     Lancets (ACCU-CHEK SOFT TOUCH) lancets Use as instructed 100 each 12   leflunomide (ARAVA) 10 MG tablet Take 1 tablet (10 mg total) by mouth daily. 90 tablet 0   nitroGLYCERIN (NITROSTAT) 0.4 MG SL tablet Place 0.4 mg under the tongue every 5 (five) minutes as needed for chest pain.      rosuvastatin (CRESTOR) 40 MG tablet TAKE 1 TABLET EVERY DAY. STOP ATORVASTATIN 90 tablet 3   triamcinolone cream (KENALOG) 0.1 % APPLY 1 APPLICATION TOPICALLY DAILY AS NEEDED  (RASH). (Patient taking differently: Apply 1 application  topically daily.) 454 g 3   omeprazole (PRILOSEC) 40 MG capsule Take 1 capsule (40 mg total) by mouth daily. (Patient not taking: Reported on 01/09/2023) 90 capsule 3   No current facility-administered medications for this visit.    Review of Systems: GENERAL: negative for malaise, night sweats HEENT: No changes in hearing or vision, no nose bleeds or other nasal problems. NECK: Negative for lumps, goiter, pain and significant neck swelling RESPIRATORY: Negative for cough, wheezing CARDIOVASCULAR: Negative for chest pain, leg swelling, palpitations, orthopnea GI: SEE HPI MUSCULOSKELETAL: Negative for joint pain or swelling, back pain, and muscle pain. SKIN: Negative for lesions, rash PSYCH: Negative for sleep disturbance, mood disorder and recent psychosocial stressors. HEMATOLOGY Negative for prolonged bleeding, bruising easily, and swollen nodes. ENDOCRINE: Negative for cold or heat intolerance, polyuria, polydipsia and goiter. NEURO: negative for tremor, gait imbalance, syncope and seizures. The remainder of the review of systems is noncontributory.   Physical Exam: BP (!) 172/81 (BP Location: Right Arm, Patient Position: Sitting, Cuff Size: Normal)   Pulse 80   Temp 98.2 F (36.8 C) (Oral)   Ht '5\' 10"'$  (1.778 m)   Wt 185 lb (83.9 kg)   BMI 26.54 kg/m  GENERAL: The patient is AO x3, in no acute distress. HEENT: Head is normocephalic and atraumatic. EOMI are intact. Mouth is well hydrated and without lesions. NECK: Supple. No masses LUNGS: Clear to auscultation. No presence of rhonchi/wheezing/rales. Adequate chest expansion HEART: RRR, normal s1 and s2. ABDOMEN: Soft, nontender, no guarding, no peritoneal signs, and nondistended. BS +. No masses. EXTREMITIES: Without any cyanosis, clubbing, rash, lesions or edema. NEUROLOGIC: AOx3, no focal motor deficit. SKIN: no jaundice, no rashes  Imaging/Labs: as above  I  personally reviewed and interpreted the available labs, imaging and endoscopic files.  Impression and Plan: AVYAAN SAUNDER is a 77 y.o. male with past medical history of MI and CAD s/p stents x2, prostate cancer s/p radiation therapy, RA, history of kidney stones, hyperlipidemia, hypertension, prediabetes, CKD stage 4 and cecal AVMs, DILI, who presents for follow up of IDA.  Patient has not presented any recurrent overt gastrointestinal bleeding.  It appears that his hemoglobin has stabilized and he has not presented any other complaints.  I advised him to restart taking low-dose omeprazole for management of his  gastritis.  I asked him to fax back results of his repeat labs, may consider IV iron infusion if stores are not improving and even consider capsule endoscopy.  Patient had no elevation of his liver enzymes while taking terbinafine but this has resolved since he stopped his medication, which is consistent with drug-induced liver injury that has resolved.  -Patient will request nephrologist to fax results of repeat results in 6 weeks (CBC and iron stores) -Restart omeprazole 20 mg qday  All questions were answered.      Jacob Peppers, MD Gastroenterology and Hepatology [facility]

## 2023-02-06 NOTE — Patient Instructions (Signed)
Please ask your kidney doctor to fax results of repeat results in 6 weeks. Restart omeprazole 20 mg qday

## 2023-02-08 DIAGNOSIS — K719 Toxic liver disease, unspecified: Secondary | ICD-10-CM | POA: Insufficient documentation

## 2023-02-13 ENCOUNTER — Other Ambulatory Visit: Payer: Self-pay

## 2023-02-13 ENCOUNTER — Emergency Department (HOSPITAL_COMMUNITY): Payer: Medicare HMO

## 2023-02-13 ENCOUNTER — Inpatient Hospital Stay (HOSPITAL_COMMUNITY)
Admission: EM | Admit: 2023-02-13 | Discharge: 2023-02-17 | DRG: 381 | Disposition: A | Discharge status: Home Health | Payer: Medicare HMO | Attending: Family Medicine | Admitting: Family Medicine

## 2023-02-13 ENCOUNTER — Encounter (HOSPITAL_COMMUNITY): Payer: Self-pay | Admitting: Emergency Medicine

## 2023-02-13 DIAGNOSIS — J90 Pleural effusion, not elsewhere classified: Secondary | ICD-10-CM | POA: Diagnosis not present

## 2023-02-13 DIAGNOSIS — R531 Weakness: Secondary | ICD-10-CM | POA: Diagnosis not present

## 2023-02-13 DIAGNOSIS — K2211 Ulcer of esophagus with bleeding: Principal | ICD-10-CM | POA: Diagnosis present

## 2023-02-13 DIAGNOSIS — E782 Mixed hyperlipidemia: Secondary | ICD-10-CM | POA: Diagnosis not present

## 2023-02-13 DIAGNOSIS — R0600 Dyspnea, unspecified: Secondary | ICD-10-CM | POA: Diagnosis not present

## 2023-02-13 DIAGNOSIS — Z955 Presence of coronary angioplasty implant and graft: Secondary | ICD-10-CM | POA: Diagnosis not present

## 2023-02-13 DIAGNOSIS — J209 Acute bronchitis, unspecified: Secondary | ICD-10-CM | POA: Diagnosis not present

## 2023-02-13 DIAGNOSIS — Z833 Family history of diabetes mellitus: Secondary | ICD-10-CM | POA: Diagnosis not present

## 2023-02-13 DIAGNOSIS — K2970 Gastritis, unspecified, without bleeding: Secondary | ICD-10-CM | POA: Diagnosis present

## 2023-02-13 DIAGNOSIS — M069 Rheumatoid arthritis, unspecified: Secondary | ICD-10-CM | POA: Diagnosis present

## 2023-02-13 DIAGNOSIS — N184 Chronic kidney disease, stage 4 (severe): Secondary | ICD-10-CM | POA: Diagnosis not present

## 2023-02-13 DIAGNOSIS — I480 Paroxysmal atrial fibrillation: Secondary | ICD-10-CM | POA: Diagnosis present

## 2023-02-13 DIAGNOSIS — E876 Hypokalemia: Secondary | ICD-10-CM | POA: Diagnosis present

## 2023-02-13 DIAGNOSIS — R0602 Shortness of breath: Secondary | ICD-10-CM | POA: Diagnosis not present

## 2023-02-13 DIAGNOSIS — I130 Hypertensive heart and chronic kidney disease with heart failure and stage 1 through stage 4 chronic kidney disease, or unspecified chronic kidney disease: Secondary | ICD-10-CM | POA: Diagnosis present

## 2023-02-13 DIAGNOSIS — M109 Gout, unspecified: Secondary | ICD-10-CM | POA: Diagnosis present

## 2023-02-13 DIAGNOSIS — Z825 Family history of asthma and other chronic lower respiratory diseases: Secondary | ICD-10-CM

## 2023-02-13 DIAGNOSIS — I252 Old myocardial infarction: Secondary | ICD-10-CM

## 2023-02-13 DIAGNOSIS — K219 Gastro-esophageal reflux disease without esophagitis: Secondary | ICD-10-CM | POA: Diagnosis present

## 2023-02-13 DIAGNOSIS — K2990 Gastroduodenitis, unspecified, without bleeding: Secondary | ICD-10-CM | POA: Diagnosis not present

## 2023-02-13 DIAGNOSIS — I495 Sick sinus syndrome: Secondary | ICD-10-CM | POA: Diagnosis not present

## 2023-02-13 DIAGNOSIS — D6869 Other thrombophilia: Secondary | ICD-10-CM | POA: Diagnosis present

## 2023-02-13 DIAGNOSIS — M4802 Spinal stenosis, cervical region: Secondary | ICD-10-CM | POA: Diagnosis present

## 2023-02-13 DIAGNOSIS — E1122 Type 2 diabetes mellitus with diabetic chronic kidney disease: Secondary | ICD-10-CM | POA: Diagnosis not present

## 2023-02-13 DIAGNOSIS — K922 Gastrointestinal hemorrhage, unspecified: Secondary | ICD-10-CM | POA: Diagnosis not present

## 2023-02-13 DIAGNOSIS — Z8546 Personal history of malignant neoplasm of prostate: Secondary | ICD-10-CM

## 2023-02-13 DIAGNOSIS — K2980 Duodenitis without bleeding: Secondary | ICD-10-CM | POA: Diagnosis not present

## 2023-02-13 DIAGNOSIS — Z8249 Family history of ischemic heart disease and other diseases of the circulatory system: Secondary | ICD-10-CM

## 2023-02-13 DIAGNOSIS — Z923 Personal history of irradiation: Secondary | ICD-10-CM

## 2023-02-13 DIAGNOSIS — Z79899 Other long term (current) drug therapy: Secondary | ICD-10-CM | POA: Diagnosis not present

## 2023-02-13 DIAGNOSIS — G8929 Other chronic pain: Secondary | ICD-10-CM | POA: Diagnosis present

## 2023-02-13 DIAGNOSIS — Z8739 Personal history of other diseases of the musculoskeletal system and connective tissue: Secondary | ICD-10-CM

## 2023-02-13 DIAGNOSIS — Z1152 Encounter for screening for COVID-19: Secondary | ICD-10-CM

## 2023-02-13 DIAGNOSIS — K2210 Ulcer of esophagus without bleeding: Secondary | ICD-10-CM | POA: Diagnosis not present

## 2023-02-13 DIAGNOSIS — D62 Acute posthemorrhagic anemia: Secondary | ICD-10-CM | POA: Diagnosis present

## 2023-02-13 DIAGNOSIS — Z87891 Personal history of nicotine dependence: Secondary | ICD-10-CM

## 2023-02-13 DIAGNOSIS — I5032 Chronic diastolic (congestive) heart failure: Secondary | ICD-10-CM | POA: Diagnosis present

## 2023-02-13 DIAGNOSIS — Z7984 Long term (current) use of oral hypoglycemic drugs: Secondary | ICD-10-CM

## 2023-02-13 DIAGNOSIS — D6859 Other primary thrombophilia: Secondary | ICD-10-CM | POA: Diagnosis present

## 2023-02-13 DIAGNOSIS — J9811 Atelectasis: Secondary | ICD-10-CM | POA: Diagnosis not present

## 2023-02-13 DIAGNOSIS — Z800 Family history of malignant neoplasm of digestive organs: Secondary | ICD-10-CM

## 2023-02-13 DIAGNOSIS — K921 Melena: Secondary | ICD-10-CM | POA: Diagnosis not present

## 2023-02-13 DIAGNOSIS — M5136 Other intervertebral disc degeneration, lumbar region: Secondary | ICD-10-CM | POA: Diagnosis present

## 2023-02-13 DIAGNOSIS — Z7901 Long term (current) use of anticoagulants: Secondary | ICD-10-CM

## 2023-02-13 DIAGNOSIS — K5520 Angiodysplasia of colon without hemorrhage: Secondary | ICD-10-CM

## 2023-02-13 DIAGNOSIS — I2510 Atherosclerotic heart disease of native coronary artery without angina pectoris: Secondary | ICD-10-CM | POA: Diagnosis present

## 2023-02-13 DIAGNOSIS — E785 Hyperlipidemia, unspecified: Secondary | ICD-10-CM | POA: Diagnosis present

## 2023-02-13 DIAGNOSIS — Z803 Family history of malignant neoplasm of breast: Secondary | ICD-10-CM

## 2023-02-13 DIAGNOSIS — I10 Essential (primary) hypertension: Secondary | ICD-10-CM | POA: Diagnosis present

## 2023-02-13 LAB — PREPARE RBC (CROSSMATCH)

## 2023-02-13 LAB — CBC
HCT: 24.4 % — ABNORMAL LOW (ref 39.0–52.0)
Hemoglobin: 7.6 g/dL — ABNORMAL LOW (ref 13.0–17.0)
MCH: 28.8 pg (ref 26.0–34.0)
MCHC: 31.1 g/dL (ref 30.0–36.0)
MCV: 92.4 fL (ref 80.0–100.0)
Platelets: 153 10*3/uL (ref 150–400)
RBC: 2.64 MIL/uL — ABNORMAL LOW (ref 4.22–5.81)
RDW: 16.1 % — ABNORMAL HIGH (ref 11.5–15.5)
WBC: 7.4 10*3/uL (ref 4.0–10.5)
nRBC: 0 % (ref 0.0–0.2)

## 2023-02-13 LAB — BASIC METABOLIC PANEL
Anion gap: 9 (ref 5–15)
BUN: 38 mg/dL — ABNORMAL HIGH (ref 8–23)
CO2: 20 mmol/L — ABNORMAL LOW (ref 22–32)
Calcium: 8.6 mg/dL — ABNORMAL LOW (ref 8.9–10.3)
Chloride: 109 mmol/L (ref 98–111)
Creatinine, Ser: 3.16 mg/dL — ABNORMAL HIGH (ref 0.61–1.24)
GFR, Estimated: 19 mL/min — ABNORMAL LOW (ref >60)
Glucose, Bld: 122 mg/dL — ABNORMAL HIGH (ref 70–99)
Potassium: 3.6 mmol/L (ref 3.5–5.1)
Sodium: 138 mmol/L (ref 135–145)

## 2023-02-13 LAB — RESP PANEL BY RT-PCR (RSV, FLU A&B, COVID)  RVPGX2
Influenza A by PCR: NEGATIVE
Influenza B by PCR: NEGATIVE
Resp Syncytial Virus by PCR: NEGATIVE
SARS Coronavirus 2 by RT PCR: NEGATIVE

## 2023-02-13 LAB — TROPONIN I (HIGH SENSITIVITY): Troponin I (High Sensitivity): 22 ng/L — ABNORMAL HIGH (ref <18)

## 2023-02-13 LAB — ABO/RH: ABO/RH(D): O POS

## 2023-02-13 LAB — POC OCCULT BLOOD, ED: Fecal Occult Bld: POSITIVE — AB

## 2023-02-13 LAB — TSH: TSH: 3.636 u[IU]/mL (ref 0.350–4.500)

## 2023-02-13 LAB — BRAIN NATRIURETIC PEPTIDE: B Natriuretic Peptide: 376 pg/mL — ABNORMAL HIGH (ref 0.0–100.0)

## 2023-02-13 MED ORDER — DEXTROMETHORPHAN POLISTIREX ER 30 MG/5ML PO SUER
30.0000 mg | Freq: Two times a day (BID) | ORAL | Status: DC | PRN
  Administered 2023-02-14: 30 mg via ORAL
  Filled 2023-02-13: qty 5

## 2023-02-13 MED ORDER — DIPHENHYDRAMINE HCL 50 MG/ML IJ SOLN
25.0000 mg | Freq: Once | INTRAMUSCULAR | Status: AC
  Administered 2023-02-13: 25 mg via INTRAVENOUS
  Filled 2023-02-13: qty 1

## 2023-02-13 MED ORDER — INSULIN ASPART 100 UNIT/ML IJ SOLN: 0.0000 [IU] | Freq: Three times a day (TID) | INTRAMUSCULAR | Status: DC

## 2023-02-13 MED ORDER — DOXYCYCLINE HYCLATE 100 MG PO TABS
100.0000 mg | ORAL_TABLET | Freq: Two times a day (BID) | ORAL | Status: AC
  Administered 2023-02-13 – 2023-02-15 (×6): 100 mg via ORAL
  Filled 2023-02-13 (×6): qty 1

## 2023-02-13 MED ORDER — ONDANSETRON HCL 4 MG PO TABS: 4.0000 mg | ORAL_TABLET | Freq: Four times a day (QID) | ORAL | Status: DC | PRN

## 2023-02-13 MED ORDER — LEFLUNOMIDE 10 MG PO TABS
10.0000 mg | ORAL_TABLET | Freq: Every day | ORAL | Status: DC
  Administered 2023-02-14 – 2023-02-15 (×2): 10 mg via ORAL
  Filled 2023-02-13 (×6): qty 1

## 2023-02-13 MED ORDER — PANTOPRAZOLE SODIUM 40 MG IV SOLR
40.0000 mg | Freq: Two times a day (BID) | INTRAVENOUS | Status: DC
  Administered 2023-02-13 – 2023-02-15 (×6): 40 mg via INTRAVENOUS
  Filled 2023-02-13 (×7): qty 10

## 2023-02-13 MED ORDER — GUAIFENESIN ER 600 MG PO TB12
600.0000 mg | ORAL_TABLET | Freq: Two times a day (BID) | ORAL | Status: DC
  Administered 2023-02-13 – 2023-02-15 (×5): 600 mg via ORAL
  Filled 2023-02-13 (×5): qty 1

## 2023-02-13 MED ORDER — ACETAMINOPHEN 650 MG RE SUPP: 650.0000 mg | Freq: Four times a day (QID) | RECTAL | Status: DC | PRN

## 2023-02-13 MED ORDER — BISACODYL 5 MG PO TBEC: 5.0000 mg | DELAYED_RELEASE_TABLET | Freq: Every day | ORAL | Status: DC | PRN

## 2023-02-13 MED ORDER — NITROGLYCERIN 0.4 MG SL SUBL: 0.4000 mg | SUBLINGUAL_TABLET | SUBLINGUAL | Status: DC | PRN

## 2023-02-13 MED ORDER — SODIUM CHLORIDE 0.9% IV SOLUTION: Freq: Once | INTRAVENOUS | Status: AC

## 2023-02-13 MED ORDER — ROSUVASTATIN CALCIUM 20 MG PO TABS: 40.0000 mg | ORAL_TABLET | Freq: Every evening | ORAL | Status: DC

## 2023-02-13 MED ORDER — ALLOPURINOL 100 MG PO TABS
50.0000 mg | ORAL_TABLET | ORAL | Status: DC
  Administered 2023-02-14: 50 mg via ORAL
  Filled 2023-02-13 (×2): qty 1

## 2023-02-13 MED ORDER — FENTANYL CITRATE PF 50 MCG/ML IJ SOSY: 12.5000 ug | PREFILLED_SYRINGE | INTRAMUSCULAR | Status: DC | PRN

## 2023-02-13 MED ORDER — TRAZODONE HCL 50 MG PO TABS: 25.0000 mg | ORAL_TABLET | Freq: Every evening | ORAL | Status: DC | PRN

## 2023-02-13 MED ORDER — AMLODIPINE BESYLATE 5 MG PO TABS
10.0000 mg | ORAL_TABLET | Freq: Every day | ORAL | Status: DC
  Administered 2023-02-14 – 2023-02-17 (×3): 10 mg via ORAL
  Filled 2023-02-13 (×4): qty 2

## 2023-02-13 MED ORDER — ACETAMINOPHEN 325 MG PO TABS
650.0000 mg | ORAL_TABLET | Freq: Four times a day (QID) | ORAL | Status: DC | PRN
  Filled 2023-02-13: qty 2

## 2023-02-13 MED ORDER — ROSUVASTATIN CALCIUM 20 MG PO TABS
20.0000 mg | ORAL_TABLET | Freq: Every evening | ORAL | Status: DC
  Administered 2023-02-13 – 2023-02-16 (×4): 20 mg via ORAL
  Filled 2023-02-13 (×4): qty 1

## 2023-02-13 MED ORDER — HYDRALAZINE HCL 25 MG PO TABS
100.0000 mg | ORAL_TABLET | Freq: Two times a day (BID) | ORAL | Status: DC
  Administered 2023-02-13 – 2023-02-17 (×7): 100 mg via ORAL
  Filled 2023-02-13 (×9): qty 4

## 2023-02-13 MED ORDER — OXYCODONE HCL 5 MG PO TABS
5.0000 mg | ORAL_TABLET | Freq: Four times a day (QID) | ORAL | Status: DC | PRN
  Administered 2023-02-13: 5 mg via ORAL
  Filled 2023-02-13: qty 1

## 2023-02-13 MED ORDER — HYDROCODONE-ACETAMINOPHEN 5-325 MG PO TABS
1.0000 | ORAL_TABLET | Freq: Once | ORAL | Status: AC
  Administered 2023-02-13: 1 via ORAL
  Filled 2023-02-13: qty 1

## 2023-02-13 MED ORDER — ONDANSETRON HCL 4 MG/2ML IJ SOLN: 4.0000 mg | Freq: Four times a day (QID) | INTRAMUSCULAR | Status: DC | PRN

## 2023-02-13 MED ORDER — IPRATROPIUM-ALBUTEROL 0.5-2.5 (3) MG/3ML IN SOLN
3.0000 mL | Freq: Four times a day (QID) | RESPIRATORY_TRACT | Status: DC
  Administered 2023-02-13 (×2): 3 mL via RESPIRATORY_TRACT
  Filled 2023-02-13 (×2): qty 3

## 2023-02-13 MED ORDER — FUROSEMIDE 10 MG/ML IJ SOLN
20.0000 mg | Freq: Once | INTRAMUSCULAR | Status: AC
  Administered 2023-02-13: 20 mg via INTRAVENOUS
  Filled 2023-02-13: qty 2

## 2023-02-13 NOTE — Hospital Course (Signed)
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  The patient reports that for the past 2 days he has had increasing shortness of breath with short distance ambulation and noticed some increasing edema in the legs.  He has had some wheezing coughing chest congestion.  He has been having melanotic stools for the past several days as well.  He says has been taking his apixaban regularly.  He denies chest pain symptoms.  He denies palpitations.  He was noted to have guaiac positive stools that were melanotic in appearance.  He had mild peripheral edema in the lower extremities.  His chest x-ray suggested atelectasis versus bronchitis and reactive airways.  His hemoglobin is down to 7.6 from 11 approximately 1 month ago.  He is being transfused 1 unit PRBC given his cardiac history and GI was consulted and planning upper endoscopy.  GI recommended IV Protonix twice daily.  Further recommendations forthcoming.

## 2023-02-13 NOTE — H&P (Signed)
History and Physical  [facility]  Jacob Farmer [id number] DOB: 1946-04-11 DOA: 02/13/2023  PCP: Lindell Spar, MD  Patient coming from: Home  Level of care: Telemetry  I have personally briefly reviewed patient's old medical records in [place]  Chief Complaint: SOB   HPI: Jacob Farmer is a 77 year old gentleman with history of coronary artery disease status post stenting, history of MI, paroxysmal atrial fibrillation on apixaban, history of prostate cancer status post radiation therapy, rheumatoid arthritis, gout, stage IV CKD, iron deficiency anemia, history of cecal AVMs, 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  The patient reports that for the past 2 days he has had increasing shortness of breath with short distance ambulation and noticed some increasing edema in the legs.  He has had some wheezing coughing chest congestion.  He has been having melanotic stools for the past several days as well.  He says has been taking his apixaban regularly.  He denies chest pain symptoms.  He denies palpitations.  He was noted to have guaiac positive stools that were melanotic in appearance.  He had mild peripheral edema in the lower extremities.  His chest x-ray suggested atelectasis versus bronchitis and reactive airways.  His hemoglobin is down to 7.6 from 11 approximately 1 month ago.  He is being transfused 1 unit PRBC given his cardiac history and GI was consulted and planning upper endoscopy.  GI recommended IV Protonix twice daily.  Further recommendations forthcoming.    Past Medical History:  Diagnosis Date   CAD (coronary artery disease)    Acute myocardial infarction treated with TPA in 1990; 1999-stents to circumflex and RCA; residual total occlusion of the left anterior descending; normal ejection fraction; stress nuclear in 2001-distal anteroseptal ischemia; normal LV function    CKD (chronic kidney disease) stage 4, GFR 15-29 ml/min (HCC)    First degree heart block    History of gastric ulcer    History of kidney stones    History of MI (myocardial infarction)    1990-  treated w/ TPA   Hyperlipidemia    Hypertension    Jaundice    OA (osteoarthritis)    Prediabetes    Prostate cancer ([place])    Stage T1c , Gleason 3+4,  PSA 4.7,  vol 24cc--  scheduled for radiative seed implants   Psoriatic arthritis ([place])    RA (rheumatoid arthritis) ([place])    Dr. Toni Amend   Sinus bradycardia    Wears dentures     Past Surgical History:  Procedure Laterality Date   BIOPSY  08/27/2019   Procedure: BIOPSY;  Surgeon: Rogene Houston, MD;  [facility];  Service: Endoscopy;;  gastric   BIOPSY  04/03/2022   Procedure: BIOPSY;  Surgeon: Montez Morita, Quillian Quince, MD;  [facility];  Service: Gastroenterology;;   callus removal Left 09/13/2022   left foot   CARDIOVASCULAR STRESS TEST  2001  per Dr Lattie Haw clinic note   distal anteroseptal ischemia,  normal LVF   COLONOSCOPY  last one 2006 (approx)   COLONOSCOPY WITH PROPOFOL N/A 04/03/2022   Procedure: COLONOSCOPY WITH PROPOFOL;  Surgeon: Harvel Quale, MD;  [facility];  Service: Gastroenterology;  Laterality: N/A;  [place]   DES x2  to CFX and RCA/  residual total occlusion LAD,  normal LVEF   ESOPHAGOGASTRODUODENOSCOPY (  EGD) WITH PROPOFOL N/A 08/27/2019   Procedure: ESOPHAGOGASTRODUODENOSCOPY (EGD) WITH PROPOFOL;  Surgeon: Rogene Houston, MD;  [facility];  Service: Endoscopy;  Laterality: N/A;  10:10   ESOPHAGOGASTRODUODENOSCOPY (EGD) WITH PROPOFOL N/A 04/03/2022   Procedure: ESOPHAGOGASTRODUODENOSCOPY (EGD) WITH PROPOFOL;  Surgeon: Harvel Quale, MD;  [facility];  Service: Gastroenterology;  Laterality: N/A;   EYE SURGERY Bilateral    cataract   HOT HEMOSTASIS  04/03/2022    Procedure: HOT HEMOSTASIS (ARGON PLASMA COAGULATION/BICAP);  Surgeon: Montez Morita, Quillian Quince, MD;  [facility];  Service: Gastroenterology;;   POLYPECTOMY  04/03/2022   Procedure: POLYPECTOMY;  Surgeon: Harvel Quale, MD;  [facility];  Service: Gastroenterology;;   POSTERIOR CERVICAL FUSION/FORAMINOTOMY N/A 05/26/2017   Procedure: RIGHT C4-5 FORAMINOTOMY WITH EXCISION OF HERNIATED [place];  Surgeon: Jessy Oto, MD;  Location: [place];  Service: Orthopedics;  Laterality: N/A;   RADIOACTIVE SEED IMPLANT N/A 01/11/2016   Procedure: RADIOACTIVE SEED IMPLANT/BRACHYTHERAPY IMPLANT;  Surgeon: Franchot Gallo, MD;  Location: [facility];  Service: Urology;  Laterality: N/A;   73  seeds implanted no seeds founds in bladder     reports that he quit smoking about 33 years ago. His smoking use included cigarettes. He has a 30.00 pack-year smoking history. He has never been exposed to tobacco smoke. He has never used smokeless tobacco. He reports current alcohol use. He reports that he does not use drugs.  No Known Allergies  Family History  Problem Relation Age of Onset   Heart attack Mother    Coronary artery disease Father    Ulcerative colitis Father    Ulcers Father    Breast cancer Sister    COPD Brother    Pancreatic cancer Brother    Healthy Sister    Diabetes Brother    Cirrhosis Son    Seizures Son     Prior to Admission medications   Medication Sig Start Date End Date Taking? Authorizing Provider  allopurinol (ZYLOPRIM) 100 MG tablet TAKE 1/2 TABLET EVERY OTHER DAY Patient taking differently: Take 50 mg by mouth every other day. 04/10/22  Yes Deveshwar, Abel Presto, MD  amLODipine (NORVASC) 10 MG tablet Take 1 tablet (10 mg total) by mouth daily. 12/25/22  Yes Lindell Spar, MD  apixaban (ELIQUIS) 5 MG TABS tablet Take 1 tablet (5 mg total) by mouth 2 (two) times daily. 11/20/21  Yes BranchAlphonse Guild, MD  Colchicine 0.6 MG CAPS Take  0.6 mg by mouth daily as needed (Gout).   Yes [provider]  Dupilumab (DUPIXENT) 300 MG/2ML SOPN Inject 1 application into the skin every 14 (fourteen) days. 02/13/22  Yes Lavonna Monarch, MD  folic acid (FOLVITE) A999333 MCG tablet Take 400 mcg by mouth daily.   Yes [provider]  hydrALAZINE (APRESOLINE) 50 MG tablet Take 100 mg by mouth 2 (two) times daily. 10/14/22 10/14/23 Yes [provider]  leflunomide (ARAVA) 10 MG tablet Take 1 tablet (10 mg total) by mouth daily. 01/02/23  Yes Deveshwar, Abel Presto, MD  omeprazole (PRILOSEC) 20 MG capsule Take 1 capsule (20 mg total) by mouth daily. 02/06/23  Yes Harvel Quale, MD  rosuvastatin (CRESTOR) 40 MG tablet TAKE 1 TABLET EVERY DAY. STOP ATORVASTATIN Patient taking differently: Take 40 mg by mouth daily. 01/21/23  Yes Branch, Alphonse Guild, MD  triamcinolone cream (KENALOG) 0.1 % APPLY 1 APPLICATION TOPICALLY DAILY AS NEEDED (RASH). Patient taking differently: Apply 1 application  topically daily. 02/18/22  Yes Lavonna Monarch, MD  Alcohol Swabs (DROPSAFE ALCOHOL PREP) 70 % PADS USE AS NEEDED TO CLEAN SKIN PRIOR TO FINGERPRICK AND INJECTION 09/07/21   Ailene Ards, NP  Blood Glucose Monitoring Suppl (ACCU-CHEK GUIDE) w/Device KIT 1 Units by Does not apply route 2 (two) times daily. 11/23/20   Hurshel Party C, MD  glucose blood (ACCU-CHEK GUIDE) test strip Use as instructed 01/11/21   Hurshel Party C, MD  Lancets (ACCU-CHEK SOFT TOUCH) lancets Use as instructed 11/23/20   Hurshel Party C, MD  nitroGLYCERIN (NITROSTAT) 0.4 MG SL tablet Place 0.4 mg under the tongue every 5 (five) minutes as needed for chest pain.  08/03/19   [provider]    Physical Exam: Vitals:   02/13/23 0924 02/13/23 1130  BP: (!) 149/78 (!) 153/72  Pulse: 82   Resp: 20   Temp: 97.6 F (36.4 C)   TempSrc: Oral   SpO2: 98%    Constitutional: appears pale, NAD, calm, comfortable Eyes: PERRL, lids and conjunctivae normal ENMT:  Mucous membranes are moist but pale. Posterior pharynx clear of any exudate or lesions.Normal dentition.  Neck: normal, supple, no masses, no thyromegaly Respiratory: rales heard, upper airway congestion noises and mild exp wheeze. Normal respiratory effort. No accessory muscle use.  Cardiovascular: normal s1, s2 sounds, no murmurs / rubs / gallops. No extremity edema. 2+ pedal pulses. No carotid bruits.  Abdomen: no tenderness, no masses palpated. No hepatosplenomegaly. Bowel sounds positive.  Musculoskeletal: no clubbing / cyanosis. No joint deformity upper and lower extremities other than arthritic changes. Good ROM, no contractures. Normal muscle tone. 1+ edema BLEs mostly involving ankles.  Skin: no rashes, lesions, ulcers. No induration Neurologic: CN 2-12 grossly intact. Sensation intact, DTR normal. Strength 5/5 in all 4.  Psychiatric: Normal judgment and insight. Alert and oriented x 3. Normal mood.   Labs on Admission: I have personally reviewed following labs and imaging studies  CBC: Recent Labs  Lab 02/13/23 0937  WBC 7.4  HGB 7.6*  HCT 24.4*  MCV 92.4  PLT 0000000   Basic Metabolic Panel: Recent Labs  Lab 02/13/23 1030  NA 138  K 3.6  CL 109  CO2 20*  GLUCOSE 122*  BUN 38*  CREATININE 3.16*  CALCIUM 8.6*   GFR: Estimated Creatinine Clearance: 19.9 mL/min (A) (by C-G formula based on SCr of 3.16 mg/dL (H)). Liver Function Tests: No results for input(s): "AST", "ALT", "ALKPHOS", "BILITOT", "PROT", "ALBUMIN" in the last 168 hours. No results for input(s): "LIPASE", "AMYLASE" in the last 168 hours. No results for input(s): "AMMONIA" in the last 168 hours. Coagulation Profile: No results for input(s): "INR", "PROTIME" in the last 168 hours. Cardiac Enzymes: No results for input(s): "CKTOTAL", "CKMB", "CKMBINDEX", "TROPONINI" in the last 168 hours. BNP (last 3 results) No results for input(s): "PROBNP" in the last 8760 hours. HbA1C: No results for input(s):  "HGBA1C" in the last 72 hours. CBG: No results for input(s): "GLUCAP" in the last 168 hours. Lipid Profile: No results for input(s): "CHOL", "HDL", "LDLCALC", "TRIG", "CHOLHDL", "LDLDIRECT" in the last 72 hours. Thyroid Function Tests: No results for input(s): "TSH", "T4TOTAL", "FREET4", "T3FREE", "THYROIDAB" in the last 72 hours. Anemia Panel: No results for input(s): "VITAMINB12", "FOLATE", "FERRITIN", "TIBC", "IRON", "RETICCTPCT" in the last 72 hours. Urine analysis:    Component Value Date/Time   COLORURINE YELLOW 11/02/2020 [employer] 11/02/2020 1155   LABSPEC 1.017 11/02/2020 1155   PHURINE 6.0 11/02/2020 1155   GLUCOSEU NEGATIVE 11/02/2020 1155  HGBUR NEGATIVE 11/02/2020 [place] 11/02/2020 [place] 11/02/2020 1155   PROTEINUR 100 (A) 11/02/2020 1155   UROBILINOGEN 0.2 04/02/2013 1227   NITRITE NEGATIVE 11/02/2020 [place] 11/02/2020 1155    Radiological Exams on Admission: DG Chest Port 1 View  Result Date: 02/13/2023 CLINICAL DATA:  Shortness of breath EXAM: PORTABLE CHEST 1 VIEW COMPARISON:  08/25/2020 FINDINGS: Upper normal heart size for AP technique. Retrocardiac airspace opacity suspicious for pneumonia or atelectasis. Questionable right retrocardiac medial airspace opacity, cannot exclude multilobar pneumonia. Airway thickening is present, suggesting bronchitis or reactive airways disease. No blunting of the lateral costophrenic angles. IMPRESSION: 1. Retrocardiac airspace opacity suspicious for pneumonia or atelectasis. Questionable right retrocardiac airspace opacity, cannot exclude multilobar pneumonia. 2. Airway thickening is present, suggesting bronchitis or reactive airways disease. 3. Upper normal heart size for AP technique. Electronically Signed   By: Van Clines M.D.   On: 02/13/2023 10:05    EKG: Independently reviewed.   Assessment/Plan Principal Problem:   Upper GI  bleed Active Problems:   Hyperlipidemia   Essential hypertension   Coronary artery disease involving native coronary artery of native heart without angina pectoris   Rheumatoid arthritis (HCC)   Chronic kidney disease, stage 4 (severe) (HCC)   Type 2 diabetes mellitus with diabetic chronic kidney disease (HCC)   History of prostate cancer   History of gout   DDD (degenerative disc disease), lumbar   Cervical spinal stenosis   Paroxysmal atrial fibrillation (HCC)   Tachycardia-bradycardia syndrome (HCC)   Chronic heart failure with preserved ejection fraction (HCC)   AVM (arteriovenous malformation) of colon   Gastritis and gastroduodenitis   Acquired thrombophilia (HCC)   Generalized weakness   Acute bronchitis    Upper GI Bleed  - pt has had about 2 weeks of melanotic stools on apixaban - his Hg has continued to trend down, now 7.6 down from 11 one month ago - he stool is guaiac positive  - continue IV pantoprazole BID  - GI consultation requested for inpatient upper endoscopy  H/O Gastritis and gastroduodenitis - IV pantoprazole BID as ordered - appreciate GI consult and recommendations   Acute/chronic blood loss anemia - transfuse 1 unit PRBC, recheck CBC in AM  - endoscopy as above  Paroxysmal Atrial Fibrillation  - he is not on rate controlling medication - holding apixaban in setting of acute GI bleeding  CAD s/p MI and s/p stent x 2 - resume home cardiac medications - no current CP symptoms - monitor telemetry - no further troponin tests needed  Essential hypertension  - resume home BP medication and follow BP  CKD stage IV - stable creatinine - resume home gout medication - renally dose medication as able   Acute Bronchitis  - added mucinex, delsym  - added Duoneb Q6 hours - added doxycycline 100 mg BID x 3 days   Generalized Weakness - multifactorial in setting of anemia, bronchitis - transfuse 1 unit PRBC - when more medically stable will ask  for PT assessment   H/O prostate Cancer - resume home medication   Postop s/p 5th left floating osteotomy (01/13/23) - pt is 1 month postop  - follow up with Dr. Posey Pronto his surgeon/podiatrist - weightbearing as tolerated in cam shoe   DVT prophylaxis: SCD/Ted Hose   Code Status: Full   Family Communication: wife at bedside   Disposition Plan: TBD   Consults called: GI   Admission status: INP  Level of care: Telemetry Irwin Brakeman MD [facility] How to contact the [facility] Attending or Consulting provider [place] or covering provider during after hours [place], for this patient?  Check the care team in [facility] and look for a) attending/consulting [facility] provider listed and b) the [facility] team listed Log into [email or web address] and use [place]'s universal password to access. If you do not have the password, please contact the hospital operator. Locate the [facility] provider you are looking for under [facility] and page to a number that you can be directly reached. If you still have difficulty reaching the provider, please page the [facility] (Director on Call) for the Hospitalists listed on amion for assistance.   If 7PM-7AM, please contact night-coverage [email or web address] Password [facility]  02/13/2023, 12:21 PM

## 2023-02-13 NOTE — ED Provider Notes (Signed)
[facility] Provider Note   CSN: [id number] Arrival date & time: 02/13/23  [id number]     History  Chief Complaint  Patient presents with   Shortness of Breath    Jacob Farmer is a 77 y.o. male.  77 year old male presents today for evaluation of shortness of breath ongoing for the past several days.  He has history of CAD, A-fib on Eliquis last dose this morning.  Denies any abdominal pain, nausea, vomiting.  Does endorse melanotic stools for the past 2 weeks.  Has some peripheral edema which wife states has been present since last night.  Denies orthopnea, PND.  The history is provided by the patient. No language interpreter was used.       Home Medications Prior to Admission medications   Medication Sig Start Date End Date Taking? Authorizing Provider  Alcohol Swabs (DROPSAFE ALCOHOL PREP) 70 % PADS USE AS NEEDED TO CLEAN SKIN PRIOR TO FINGERPRICK AND INJECTION 09/07/21   Ailene Ards, NP  allopurinol (ZYLOPRIM) 100 MG tablet TAKE 1/2 TABLET EVERY OTHER DAY 04/10/22   Bo Merino, MD  amLODipine (NORVASC) 10 MG tablet Take 1 tablet (10 mg total) by mouth daily. 12/25/22   Lindell Spar, MD  apixaban (ELIQUIS) 5 MG TABS tablet Take 1 tablet (5 mg total) by mouth 2 (two) times daily. 11/20/21   Arnoldo Lenis, MD  Blood Glucose Monitoring Suppl (ACCU-CHEK GUIDE) w/Device KIT 1 Units by Does not apply route 2 (two) times daily. 11/23/20   Doree Albee, MD  Colchicine 0.6 MG CAPS Take 0.6 mg by mouth daily as needed (Gout).    [provider]  Dupilumab (DUPIXENT) 300 MG/2ML SOPN Inject 1 application into the skin every 14 (fourteen) days. 02/13/22   Lavonna Monarch, MD  Dupilumab (DUPIXENT) 300 MG/2ML SOPN Inject 300 mg into the skin every 14 (fourteen) days. Starting at day 15 for maintenance. 01/20/23   Ralene Bathe, MD  folic acid (FOLVITE) A999333 MCG tablet Take 400 mcg by mouth daily.    [provider]   glucose blood (ACCU-CHEK GUIDE) test strip Use as instructed 01/11/21   Doree Albee, MD  hydrALAZINE (APRESOLINE) 50 MG tablet Take 100 mg by mouth 2 (two) times daily. 10/14/22 10/14/23  [provider]  Lancets (ACCU-CHEK SOFT TOUCH) lancets Use as instructed 11/23/20   Doree Albee, MD  leflunomide (ARAVA) 10 MG tablet Take 1 tablet (10 mg total) by mouth daily. 01/02/23   Bo Merino, MD  nitroGLYCERIN (NITROSTAT) 0.4 MG SL tablet Place 0.4 mg under the tongue every 5 (five) minutes as needed for chest pain.  08/03/19   [provider]  omeprazole (PRILOSEC) 20 MG capsule Take 1 capsule (20 mg total) by mouth daily. 02/06/23   Harvel Quale, MD  rosuvastatin (CRESTOR) 40 MG tablet TAKE 1 TABLET EVERY DAY. STOP ATORVASTATIN 01/21/23   Arnoldo Lenis, MD  triamcinolone cream (KENALOG) 0.1 % APPLY 1 APPLICATION TOPICALLY DAILY AS NEEDED (RASH). Patient taking differently: Apply 1 application  topically daily. 02/18/22   Lavonna Monarch, MD      Allergies    Patient has no known allergies.    Review of Systems   Review of Systems  Constitutional:  Negative for chills and fever.  Respiratory:  Positive for shortness of breath.   Cardiovascular:  Positive for leg swelling. Negative for chest pain and palpitations.  Gastrointestinal:  Positive for blood in stool (melanotic  stools). Negative for abdominal pain and nausea.  Neurological:  Negative for syncope and light-headedness.  All other systems reviewed and are negative.   Physical Exam Updated Vital Signs BP (!) 149/78   Pulse 82   Temp 97.6 F (36.4 C) (Oral)   Resp 20   SpO2 98%  Physical Exam Vitals and nursing note reviewed.  Constitutional:      General: He is not in acute distress.    Appearance: Normal appearance. He is not ill-appearing.  HENT:     Head: Normocephalic and atraumatic.     Nose: Nose normal.  Eyes:     General: No scleral icterus.    Extraocular Movements:  Extraocular movements intact.     Conjunctiva/sclera: Conjunctivae normal.  Cardiovascular:     Rate and Rhythm: Normal rate and regular rhythm.     Pulses: Normal pulses.  Pulmonary:     Effort: Pulmonary effort is normal. No respiratory distress.     Breath sounds: Normal breath sounds. No wheezing or rales.  Abdominal:     General: There is no distension.     Palpations: Abdomen is soft.     Tenderness: There is no abdominal tenderness. There is no guarding.  Musculoskeletal:        General: Normal range of motion.     Cervical back: Normal range of motion.     Right lower leg: Edema present.     Left lower leg: Edema present.  Skin:    General: Skin is warm and dry.  Neurological:     General: No focal deficit present.     Mental Status: He is alert. Mental status is at baseline.     ED Results / Procedures / Treatments   Labs (all labs ordered are listed, but only abnormal results are displayed) Labs Reviewed  CBC - Abnormal; Notable for the following components:      Result Value   RBC 2.64 (*)    Hemoglobin 7.6 (*)    HCT 24.4 (*)    RDW 16.1 (*)    All other components within normal limits  BASIC METABOLIC PANEL    EKG None  Radiology DG Chest Port 1 View  Result Date: 02/13/2023 CLINICAL DATA:  Shortness of breath EXAM: PORTABLE CHEST 1 VIEW COMPARISON:  08/25/2020 FINDINGS: Upper normal heart size for AP technique. Retrocardiac airspace opacity suspicious for pneumonia or atelectasis. Questionable right retrocardiac medial airspace opacity, cannot exclude multilobar pneumonia. Airway thickening is present, suggesting bronchitis or reactive airways disease. No blunting of the lateral costophrenic angles. IMPRESSION: 1. Retrocardiac airspace opacity suspicious for pneumonia or atelectasis. Questionable right retrocardiac airspace opacity, cannot exclude multilobar pneumonia. 2. Airway thickening is present, suggesting bronchitis or reactive airways disease. 3.  Upper normal heart size for AP technique. Electronically Signed   By: Van Clines M.D.   On: 02/13/2023 10:05    Procedures .Critical Care  Performed by: Evlyn Courier, PA-C Authorized by: Evlyn Courier, PA-C   Critical care provider statement:    Critical care time (minutes):  30   Critical care was necessary to treat or prevent imminent or life-threatening deterioration of the following conditions: GI bleed, requiring transfusion.   Critical care was time spent personally by me on the following activities:  Development of treatment plan with patient or surrogate, discussions with consultants, evaluation of patient's response to treatment, examination of patient, ordering and review of laboratory studies, ordering and review of radiographic studies, ordering and performing treatments and interventions, pulse oximetry,  re-evaluation of patient's condition and review of old charts   Care discussed with: admitting provider       Medications Ordered in ED Medications - No data to display  ED Course/ Medical Decision Making/ A&P                             Medical Decision Making Amount and/or Complexity of Data Reviewed Labs: ordered. Radiology: ordered.  Risk Prescription drug management. Decision regarding hospitalization.   Medical Decision Making / ED Course   This patient presents to the ED for concern of shortness of breath, melanotic stools, this involves an extensive number of treatment options, and is a complaint that carries with it a high risk of complications and morbidity.  The differential diagnosis includes GI bleed, ACS, CHF exacerbation  MDM: 77 year old male presents with above-mentioned complaints.  Overall he is well-appearing.  Without acute distress.  Vital signs are stable.  Melanotic stools for the past 2 weeks.  Is on Eliquis.  Last dose of Eliquis 2/20 9 AM.  Rectal exam with black stool, and Hemoccult positive.  Discussed with gastroenterologist who  agrees with transfusing 1 unit to get patient above 8 given history of cardiac disease, and symptomatic with shortness of breath.  Recommends Protonix 40 mg twice daily.  Unless decompensation or emergent indication he plans to wait for Eliquis to washout of patient's symptoms before proceeding with endoscopy.  Will discuss with hospitalist to admit patient.  Hemoglobin today is 7.6.  Down from 11 about 1 month ago.  Had blood work done last Thursday by his nephrologist which showed hemoglobin of 9.5.  Chest x-ray without acute cardiopulmonary process.  Discussed with hospitalist who will evaluate patient for admission.  Lab Tests: -I ordered, reviewed, and interpreted labs.   The pertinent results include:   Labs Reviewed  CBC - Abnormal; Notable for the following components:      Result Value   RBC 2.64 (*)    Hemoglobin 7.6 (*)    HCT 24.4 (*)    RDW 16.1 (*)    All other components within normal limits  POC OCCULT BLOOD, ED - Abnormal; Notable for the following components:   Fecal Occult Bld POSITIVE (*)    All other components within normal limits  BASIC METABOLIC PANEL  BRAIN NATRIURETIC PEPTIDE  TYPE AND SCREEN  PREPARE RBC (CROSSMATCH)  TROPONIN I (HIGH SENSITIVITY)      EKG  EKG Interpretation  Date/Time:    Ventricular Rate:    PR Interval:    QRS Duration:   QT Interval:    QTC Calculation:   R Axis:     Text Interpretation:           Imaging Studies ordered: I ordered imaging studies including chest x-ray I independently visualized and interpreted imaging. I agree with the radiologist interpretation   Medicines ordered and prescription drug management: Meds ordered this encounter  Medications   pantoprazole (PROTONIX) injection 40 mg   0.9 %  sodium chloride infusion (Manually program via Guardrails IV Fluids)   diphenhydrAMINE (BENADRYL) injection 25 mg    -I have reviewed the patients home medicines and have made adjustments as needed  Critical  interventions PRBC infusion.  Will give Lasix concomitantly with the infusion to limit volume overload.  Will give Benadryl to premedicate  Consultations Obtained: I requested consultation with the gastroenterologist (Dr. Gala Romney),  and discussed lab and imaging findings as well as  pertinent plan - they recommend: as above   Cardiac Monitoring: The patient was maintained on a cardiac monitor.  I personally viewed and interpreted the cardiac monitored which showed an underlying rhythm of: sinus rhythm   Reevaluation: After the interventions noted above, I reevaluated the patient and found that they have :stayed the same  Co morbidities that complicate the patient evaluation  Past Medical History:  Diagnosis Date   CAD (coronary artery disease)    Acute myocardial infarction treated with TPA in 1990; 1999-stents to circumflex and RCA; residual total occlusion of the left anterior descending; normal ejection fraction; stress nuclear in 2001-distal anteroseptal ischemia; normal LV function   CKD (chronic kidney disease) stage 4, GFR 15-29 ml/min (HCC)    First degree heart block    History of gastric ulcer    History of kidney stones    History of MI (myocardial infarction)    1990-  treated w/ TPA   Hyperlipidemia    Hypertension    Jaundice    OA (osteoarthritis)    Prediabetes    Prostate cancer (HCC)    Stage T1c , Gleason 3+4,  PSA 4.7,  vol 24cc--  scheduled for radiative seed implants   Psoriatic arthritis ([place])    RA (rheumatoid arthritis) ([place])    Dr. Toni Amend   Sinus bradycardia    Wears dentures       Dispostion: Patient discussed with hospitalist will evaluate patient for admission.  Final Clinical Impression(s) / ED Diagnoses Final diagnoses:  Gastrointestinal hemorrhage, unspecified gastrointestinal hemorrhage type    Rx / DC Orders ED Discharge Orders     None         Evlyn Courier, PA-C 02/13/23 1202    Milton Ferguson, MD 02/15/23 [phone]

## 2023-02-13 NOTE — Progress Notes (Signed)
Blood transfusion continues with no s/s allergic/adverse reaction. Pt's back pain controlled with oral pain medication. Pt ambulatory with standby assist to bathroom to void. Advised to call for needs, pt and wife state understanding.

## 2023-02-13 NOTE — ED Triage Notes (Addendum)
Pt c/o sob/ble swelling x 2 days. Pt states has been getting sob just walking to bathroom past couple days. Ble swelling noted. Pt able to complete sentences with no obvious sob in triage. A/o. Color wnl. Pt states he feels his lungs are full. Did have some llq abd pain last night but none today.

## 2023-02-13 NOTE — Consult Note (Addendum)
Gastroenterology Consult   Referring Provider: No ref. provider found Primary Care Physician:  Jacob Spar, MD Primary Gastroenterologist:  Dr. Maylon Farmer  Patient ID: Jacob Farmer; [id number]; 1946-01-03   Admit date: 02/13/2023  LOS: 0 days   Date of Consultation: 02/13/2023  Reason for Consultation:  UGI bleed    History of Present Illness   Jacob Farmer is a 77 y.o. 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  In the ED: O2 sat 98% on RA. BP 149/87. White blood cell count 7400, hemoglobin 7.6 down from 9.7 thirteen days ago, 11.9 on January 17, MCV 92.4, platelets 153,000.  BNP 376, BUN 38, creatinine 3.16 near baseline.  Chest x-ray unable to exclude multilobar pneumonia.  Patient was recently seen for routine follow-up on February 22 with Dr. Jenetta Farmer, doing well.  No reported overt GI bleeding at that time.  In retrospect, patient states his BMs have been black about 2 weeks.  Last dose of Eliquis this morning at 8 AM.  On rectal exam in the ED, stool was black and Hemoccult positive. He denies abdominal pain now but family states he has some yesterday. No heartburn, n/v. BMs have been regular. Last stool two days ago. His biggest complaint right now is acute on chronic back pain with radiation into left leg. Patient typically has intermittent pain but today his pain is not "letting up". Visibly uncomfortable during examination. Just received hydrocodone/apap 5/'325mg'$  20 minutes prior to examination.    EGD April 2023 for iron deficiency anemia: -Gastritis -Biopsy showed reactive gastropathy and slight inflammation, no H.  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   Prior to Admission medications   Medication Sig Start Date End Date Taking? Authorizing Provider  allopurinol (ZYLOPRIM) 100 MG tablet TAKE 1/2 TABLET EVERY OTHER DAY Patient taking differently: Take 50 mg by mouth every other day. 04/10/22  Yes Deveshwar, Abel Presto, MD  amLODipine (NORVASC) 10 MG tablet Take 1 tablet (10 mg total) by mouth daily. 12/25/22  Yes Jacob Spar, MD  apixaban (ELIQUIS) 5 MG TABS tablet Take 1 tablet (5 mg total) by mouth 2 (two) times daily. 11/20/21  Yes Jacob Guild, MD  Colchicine 0.6 MG CAPS Take 0.6 mg by mouth daily as needed (Gout).   Yes [provider]  Dupilumab (DUPIXENT) 300 MG/2ML SOPN Inject 1 application into the skin every 14 (fourteen) days. 02/13/22  Yes Jacob Monarch, MD  folic acid (FOLVITE) A999333 MCG tablet Take 400 mcg by mouth daily.   Yes [provider]  hydrALAZINE (APRESOLINE) 50 MG tablet Take 100 mg by mouth 2 (two) times daily. 10/14/22 10/14/23 Yes [provider]  leflunomide (ARAVA) 10 MG tablet Take 1 tablet (10 mg total) by mouth daily. 01/02/23  Yes Deveshwar, Abel Presto, MD  omeprazole (PRILOSEC) 20 MG capsule Take 1 capsule (20 mg total) by mouth daily. 02/06/23  Yes Jacob Quale, MD  rosuvastatin (CRESTOR) 40 MG tablet TAKE 1 TABLET EVERY DAY. STOP ATORVASTATIN  Patient taking differently: Take 40 mg by mouth daily. 01/21/23  Yes Branch, Alphonse Guild, MD  triamcinolone cream (KENALOG) 0.1 % APPLY 1 APPLICATION TOPICALLY DAILY AS NEEDED (RASH). Patient taking differently: Apply 1 application  topically daily. 02/18/22  Yes Jacob Monarch, MD  Alcohol Swabs (DROPSAFE ALCOHOL PREP) 70 % PADS USE AS NEEDED TO CLEAN SKIN PRIOR TO FINGERPRICK  AND INJECTION 09/07/21   Jacob Ards, NP  Blood Glucose Monitoring Suppl (ACCU-CHEK GUIDE) w/Device KIT 1 Units by Does not apply route 2 (two) times daily. 11/23/20   Jacob Party C, MD  glucose blood (ACCU-CHEK GUIDE) test strip Use as instructed 01/11/21   Jacob Party C, MD  Lancets (ACCU-CHEK SOFT TOUCH) lancets Use as instructed 11/23/20   Jacob Party C, MD  nitroGLYCERIN (NITROSTAT) 0.4 MG SL tablet Place 0.4 mg under the tongue every 5 (five) minutes as needed for chest pain.  08/03/19   [provider]    Current Facility-Administered Medications  Medication Dose Route Frequency Provider Last Rate Last Admin   0.9 %  sodium chloride infusion (Manually program via Guardrails IV Fluids)   Intravenous Once Jacob Courier, PA-Farmer       HYDROcodone-acetaminophen (NORCO/VICODIN) 5-325 MG per tablet 1 tablet  1 tablet Oral Once Jacob Farmer, Amjad, PA-Farmer       pantoprazole (PROTONIX) injection 40 mg  40 mg Intravenous Q12H Ali, Amjad, PA-Farmer   40 mg at 02/13/23 1109   Current Outpatient Medications  Medication Sig Dispense Refill   allopurinol (ZYLOPRIM) 100 MG tablet TAKE 1/2 TABLET EVERY OTHER DAY (Patient taking differently: Take 50 mg by mouth every other day.) 45 tablet 0   amLODipine (NORVASC) 10 MG tablet Take 1 tablet (10 mg total) by mouth daily. 90 tablet 3   apixaban (ELIQUIS) 5 MG TABS tablet Take 1 tablet (5 mg total) by mouth 2 (two) times daily. 180 tablet 3   Colchicine 0.6 MG CAPS Take 0.6 mg by mouth daily as needed (Gout).     Dupilumab (DUPIXENT) 300 MG/2ML SOPN Inject 1 application into the skin every 14 (fourteen) days. [id number] mL 4   folic acid (FOLVITE) A999333 MCG tablet Take 400 mcg by mouth daily.     hydrALAZINE (APRESOLINE) 50 MG tablet Take 100 mg by mouth 2 (two) times daily.     leflunomide (ARAVA) 10 MG tablet Take 1 tablet (10 mg total) by mouth daily. 90 tablet 0   omeprazole (PRILOSEC) 20 MG capsule Take 1 capsule (20 mg total) by mouth daily. 90 capsule 3    rosuvastatin (CRESTOR) 40 MG tablet TAKE 1 TABLET EVERY DAY. STOP ATORVASTATIN (Patient taking differently: Take 40 mg by mouth daily.) 90 tablet 3   triamcinolone cream (KENALOG) 0.1 % APPLY 1 APPLICATION TOPICALLY DAILY AS NEEDED (RASH). (Patient taking differently: Apply 1 application  topically daily.) 454 g 3   Alcohol Swabs (DROPSAFE ALCOHOL PREP) 70 % PADS USE AS NEEDED TO CLEAN SKIN PRIOR TO FINGERPRICK AND INJECTION 100 each 5   Blood Glucose Monitoring Suppl (ACCU-CHEK GUIDE) w/Device KIT 1 Units by Does not apply route 2 (two) times daily. 1 kit 3   glucose blood (ACCU-CHEK GUIDE) test strip Use as instructed 100 each 12   Lancets (ACCU-CHEK SOFT TOUCH) lancets Use as instructed 100 each 12   nitroGLYCERIN (NITROSTAT) 0.4 MG SL tablet Place 0.4 mg under the tongue every 5 (five) minutes as needed for chest pain.       Allergies as of 02/13/2023   (No  Known Allergies)    Past Medical History:  Diagnosis Date   CAD (coronary artery disease)    Acute myocardial infarction treated with TPA in 1990; 1999-stents to circumflex and RCA; residual total occlusion of the left anterior descending; normal ejection fraction; stress nuclear in 2001-distal anteroseptal ischemia; normal LV function   CKD (chronic kidney disease) stage 4, GFR 15-29 ml/min (HCC)    First degree heart block    History of gastric ulcer    History of kidney stones    History of MI (myocardial infarction)    1990-  treated w/ TPA   Hyperlipidemia    Hypertension    Jaundice    OA (osteoarthritis)    Prediabetes    Prostate cancer ([place])    Stage T1c , Gleason 3+4,  PSA 4.7,  vol 24cc--  scheduled for radiative seed implants   Psoriatic arthritis ([place])    RA (rheumatoid arthritis) ([place])    Dr. Toni Amend   Sinus bradycardia    Wears dentures     Past Surgical History:  Procedure Laterality Date   BIOPSY  08/27/2019   Procedure: BIOPSY;  Surgeon: Rogene Houston, MD;  [facility];  Service:  Endoscopy;;  gastric   BIOPSY  04/03/2022   Procedure: BIOPSY;  Surgeon: Montez Morita, Quillian Quince, MD;  [facility];  Service: Gastroenterology;;   callus removal Left 09/13/2022   left foot   CARDIOVASCULAR STRESS TEST  2001  per Dr Lattie Haw clinic note   distal anteroseptal ischemia,  normal LVF   COLONOSCOPY  last one 2006 (approx)   COLONOSCOPY WITH PROPOFOL N/A 04/03/2022   Procedure: COLONOSCOPY WITH PROPOFOL;  Surgeon: Jacob Quale, MD;  [facility];  Service: Gastroenterology;  Laterality: N/A;  [place]   DES x2  to CFX and RCA/  residual total occlusion LAD,  normal LVEF   ESOPHAGOGASTRODUODENOSCOPY (EGD) WITH PROPOFOL N/A 08/27/2019   Procedure: ESOPHAGOGASTRODUODENOSCOPY (EGD) WITH PROPOFOL;  Surgeon: Rogene Houston, MD;  [facility];  Service: Endoscopy;  Laterality: N/A;  10:10   ESOPHAGOGASTRODUODENOSCOPY (EGD) WITH PROPOFOL N/A 04/03/2022   Procedure: ESOPHAGOGASTRODUODENOSCOPY (EGD) WITH PROPOFOL;  Surgeon: Jacob Quale, MD;  [facility];  Service: Gastroenterology;  Laterality: N/A;   EYE SURGERY Bilateral    cataract   HOT HEMOSTASIS  04/03/2022   Procedure: HOT HEMOSTASIS (ARGON PLASMA COAGULATION/BICAP);  Surgeon: Montez Morita, Quillian Quince, MD;  [facility];  Service: Gastroenterology;;   POLYPECTOMY  04/03/2022   Procedure: POLYPECTOMY;  Surgeon: Jacob Quale, MD;  [facility];  Service: Gastroenterology;;   POSTERIOR CERVICAL FUSION/FORAMINOTOMY N/A 05/26/2017   Procedure: RIGHT C4-5 FORAMINOTOMY WITH EXCISION OF HERNIATED [place];  Surgeon: Jessy Oto, MD;  Location: [facility];  Service: Orthopedics;  Laterality: N/A;   RADIOACTIVE SEED IMPLANT N/A 01/11/2016   Procedure: RADIOACTIVE SEED IMPLANT/BRACHYTHERAPY IMPLANT;  Surgeon: Franchot Gallo, MD;  Location: [facility];  Service: Urology;   Laterality: N/A;   57  seeds implanted no seeds founds in bladder    Family History  Problem Relation Age of Onset   Heart attack Mother    Coronary artery disease Father    Ulcerative colitis Father    Ulcers Father    Breast cancer Sister    COPD Brother    Pancreatic cancer Brother    Healthy Sister    Diabetes Brother    Cirrhosis Son  Seizures Son     Social History   Socioeconomic History   Marital status: Married    Spouse name: Not on file   Number of children: 2   Years of education: Not on file   Highest education level: Not on file  Occupational History   Occupation: Retired Clinical biochemist  Tobacco Use   Smoking status: Former    Packs/day: 1.00    Years: 30.00    Total pack years: 30.00    Types: Cigarettes    Quit date: 05/21/1989    Years since quitting: 33.7    Passive exposure: Never   Smokeless tobacco: Never  Vaping Use   Vaping Use: Never used  Substance and Sexual Activity   Alcohol use: Yes    Comment: 1 beer and 1 mix drink per day   Drug use: No   Sexual activity: Not on file  Other Topics Concern   Not on file  Social History Narrative   Married for 24 years.Retired Clinical biochemist.   Social Determinants of Health   Financial Resource Strain: Low Risk  (09/14/2021)   Overall Financial Resource Strain (CARDIA)    Difficulty of Paying Living Expenses: Not hard at all  Food Insecurity: No Food Insecurity (09/14/2021)   Hunger Vital Sign    Worried About Running Out of Food in the Last Year: Never true    Ran Out of Food in the Last Year: Never true  Transportation Needs: No Transportation Needs (09/14/2021)   PRAPARE - Hydrologist (Medical): No    Lack of Transportation (Non-Medical): No  Physical Activity: Sufficiently Active (09/14/2021)   Exercise Vital Sign    Days of Exercise per Week: 7 days    Minutes of Exercise per Session: 60 min  Stress: No Stress Concern Present (09/14/2021)   [place]    Feeling of Stress : Not at all  Social Connections: Moderately Integrated (09/14/2021)   Social Connection and Isolation Panel [NHANES]    Frequency of Communication with Friends and Family: More than three times a week    Frequency of Social Gatherings with Friends and Family: More than three times a week    Attends Religious Services: More than 4 times per year    Active Member of [employer] or Organizations: No    Attends Archivist Meetings: Never    Marital Status: Married  Human resources officer Violence: Not At Risk (09/14/2021)   Humiliation, Afraid, Rape, and Kick questionnaire    Fear of Current or Ex-Partner: No    Emotionally Abused: No    Physically Abused: No    Sexually Abused: No     Review of System:   General: Negative for anorexia, weight loss, fever, chills, fatigue, weakness. Eyes: Negative for vision changes.  ENT: Negative for hoarseness, difficulty swallowing , nasal congestion. CV: Negative for chest pain, angina, palpitations, Farmer/o leg swelling and sob for few days. Respiratory:positive for dyspnea at rest, dyspnea on exertion, cough. No sputum, wheezing.  GI: See history of present illness. GU:  Negative for dysuria, hematuria, urinary incontinence, urinary frequency, nocturnal urination.  MS: Negative for joint pain. + low back pain and pain into left leg (acute on chronic) Derm: Negative for rash or itching.  Neuro: Negative for weakness, abnormal sensation, seizure, frequent headaches, memory loss, confusion.  Psych: Negative for anxiety, depression, suicidal ideation, hallucinations.  Endo: Negative for unusual weight change.  Heme: Negative for bruising or  bleeding. Allergy: Negative for rash or hives.      Physical Examination:   Vital signs in last 24 hours: Temp:  [97.6 F (36.4 Farmer)] 97.6 F (36.4 Farmer) (02/29 0924) Pulse Rate:  [82] 82 (02/29 0924) Resp:  [20] 20 (02/29 0924) BP:  (149)/(78) 149/78 (02/29 0924) SpO2:  [98 %] 98 % (02/29 0924)    General: Well-nourished, well-developed in no acute distress.  Head: Normocephalic, atraumatic.   Eyes: Conjunctiva pale, no icterus. Mouth: Oropharyngeal mucosa moist and pink , no lesions erythema or exudate. Neck: Supple without thyromegaly, masses, or lymphadenopathy.  Lungs: Clear to auscultation bilaterally.  Heart: Regular rate and rhythm, no murmurs rubs or gallops.  Abdomen: Bowel sounds are normal, nontender, nondistended, no hepatosplenomegaly or masses, no abdominal bruits or hernia , no rebound or guarding.   Rectal: not performed Extremities: 2+ bilateral lower extremity edema. no clubbing, deformity.  Neuro: Alert and oriented x 4 , grossly normal neurologically.  Skin: Warm and dry, no rash or jaundice.   Psych: Alert and cooperative, normal mood and affect.        Intake/Output from previous day: No intake/output data recorded. Intake/Output this shift: No intake/output data recorded.  Lab Results:   CBC Recent Labs    02/13/23 0937  WBC 7.4  HGB 7.6*  HCT 24.4*  MCV 92.4  PLT 153   BMET Recent Labs    02/13/23 1030  NA 138  K 3.6  CL 109  CO2 20*  GLUCOSE 122*  BUN 38*  CREATININE 3.16*  CALCIUM 8.6*   LFT No results for input(s): "BILITOT", "BILIDIR", "IBILI", "ALKPHOS", "AST", "ALT", "PROT", "ALBUMIN" in the last 72 hours.  Lipase No results for input(s): "LIPASE" in the last 72 hours.  PT/INR No results for input(s): "LABPROT", "INR" in the last 72 hours.   Hepatitis Panel No results for input(s): "HEPBSAG", "HCVAB", "HEPAIGM", "HEPBIGM" in the last 72 hours.   Imaging Studies:   DG Chest Port 1 View  Result Date: 02/13/2023 CLINICAL DATA:  Shortness of breath EXAM: PORTABLE CHEST 1 VIEW COMPARISON:  08/25/2020 FINDINGS: Upper normal heart size for AP technique. Retrocardiac airspace opacity suspicious for pneumonia or atelectasis. Questionable right retrocardiac  medial airspace opacity, cannot exclude multilobar pneumonia. Airway thickening is present, suggesting bronchitis or reactive airways disease. No blunting of the lateral costophrenic angles. IMPRESSION: 1. Retrocardiac airspace opacity suspicious for pneumonia or atelectasis. Questionable right retrocardiac airspace opacity, cannot exclude multilobar pneumonia. 2. Airway thickening is present, suggesting bronchitis or reactive airways disease. 3. Upper normal heart size for AP technique. Electronically Signed   By: Van Clines M.D.   On: 02/13/2023 10:05   DG Foot Complete Left  Result Date: 01/21/2023 Please see detailed radiograph report in office note. 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  GI consulted for upper GI bleed.  Upper GI bleed: Patient reports melena for about 2 weeks.  Just recently seen on February 22 by GI team, denied melena at that time.  At that time advised to restart omeprazole 20 mg daily.  In January hemoglobin 11.9, 2 weeks ago his hemoglobin was 9.7 in nephrology office.  Today presents with a  hemoglobin of 7.6.  On DRE, black Hemoccult positive stool.  History of gastritis and cecal AVMs April 2023.  Suspect upper GI bleed at this time.  Plan:   Recommend IV PPI twice daily. Hold Eliquis. Transfuse to maintain hemoglobin above 8 given cardiac history and shortness of breath. EGD when clinically stable and enough time has passed to allow sufficient washout of Eliquis, likely Sunday.    LOS: 0 days   We would like to thank you for the opportunity to participate in the care of  Jacob Farmer.  Laureen Ochs. Bernarda Caffey [facility] [phone] 2/29/202411:21 AM

## 2023-02-13 NOTE — Progress Notes (Signed)
Pt arrived to room 322 via WC from ED. Pt ambulated from chair to bed with only standby assistance and tolerated well. Pt states he has been having SOB with exertion, especially for past 3 days. Pt denies any dizziness or lightheadedness, but states he does feel weak in his legs. Pt oriented to safety procedures and advised to call for assist before getting OOB, pt and family member state understanding. Call bell within reach. VSS, pt only c/o is low back pain, chronic, rates 1/10 at present.

## 2023-02-14 DIAGNOSIS — D6869 Other thrombophilia: Secondary | ICD-10-CM | POA: Diagnosis not present

## 2023-02-14 DIAGNOSIS — J209 Acute bronchitis, unspecified: Secondary | ICD-10-CM

## 2023-02-14 DIAGNOSIS — K2970 Gastritis, unspecified, without bleeding: Secondary | ICD-10-CM

## 2023-02-14 DIAGNOSIS — I480 Paroxysmal atrial fibrillation: Secondary | ICD-10-CM | POA: Diagnosis not present

## 2023-02-14 DIAGNOSIS — N184 Chronic kidney disease, stage 4 (severe): Secondary | ICD-10-CM | POA: Diagnosis not present

## 2023-02-14 LAB — RENAL FUNCTION PANEL
Albumin: 3.3 g/dL — ABNORMAL LOW (ref 3.5–5.0)
Anion gap: 9 (ref 5–15)
BUN: 35 mg/dL — ABNORMAL HIGH (ref 8–23)
CO2: 20 mmol/L — ABNORMAL LOW (ref 22–32)
Calcium: 7.9 mg/dL — ABNORMAL LOW (ref 8.9–10.3)
Chloride: 109 mmol/L (ref 98–111)
Creatinine, Ser: 3.06 mg/dL — ABNORMAL HIGH (ref 0.61–1.24)
GFR, Estimated: 20 mL/min — ABNORMAL LOW (ref >60)
Glucose, Bld: 115 mg/dL — ABNORMAL HIGH (ref 70–99)
Phosphorus: 4.2 mg/dL (ref 2.5–4.6)
Potassium: 3.2 mmol/L — ABNORMAL LOW (ref 3.5–5.1)
Sodium: 138 mmol/L (ref 135–145)

## 2023-02-14 LAB — CBC
HCT: 26 % — ABNORMAL LOW (ref 39.0–52.0)
Hemoglobin: 8 g/dL — ABNORMAL LOW (ref 13.0–17.0)
MCH: 28.8 pg (ref 26.0–34.0)
MCHC: 30.8 g/dL (ref 30.0–36.0)
MCV: 93.5 fL (ref 80.0–100.0)
Platelets: 127 10*3/uL — ABNORMAL LOW (ref 150–400)
RBC: 2.78 MIL/uL — ABNORMAL LOW (ref 4.22–5.81)
RDW: 16.3 % — ABNORMAL HIGH (ref 11.5–15.5)
WBC: 6.2 10*3/uL (ref 4.0–10.5)
nRBC: 0 % (ref 0.0–0.2)

## 2023-02-14 LAB — TYPE AND SCREEN
ABO/RH(D): O POS
Antibody Screen: NEGATIVE
Unit division: 0

## 2023-02-14 LAB — BPAM RBC
Blood Product Expiration Date: 202404062359
ISSUE DATE / TIME: 202402291636
Unit Type and Rh: 5100

## 2023-02-14 LAB — MAGNESIUM: Magnesium: 2.1 mg/dL (ref 1.7–2.4)

## 2023-02-14 MED ORDER — IPRATROPIUM-ALBUTEROL 0.5-2.5 (3) MG/3ML IN SOLN
3.0000 mL | Freq: Three times a day (TID) | RESPIRATORY_TRACT | Status: DC
  Administered 2023-02-14 – 2023-02-17 (×10): 3 mL via RESPIRATORY_TRACT
  Filled 2023-02-14 (×10): qty 3

## 2023-02-14 MED ORDER — POTASSIUM CHLORIDE CRYS ER 20 MEQ PO TBCR
40.0000 meq | EXTENDED_RELEASE_TABLET | Freq: Once | ORAL | Status: AC
  Administered 2023-02-14: 40 meq via ORAL
  Filled 2023-02-14: qty 2

## 2023-02-14 NOTE — Care Management Important Message (Signed)
Important Message  Patient Details  Name: Jacob Farmer MRN: [id number] Date of Birth: Jan 13, 1946   Medicare Important Message Given:  Yes     [name] 02/14/2023, 10:48 AM

## 2023-02-14 NOTE — TOC Progression Note (Signed)
Transition of Care [facility]) - Progression Note    Patient Details  Name: Jacob Farmer MRN: [id number] Date of Birth: [birth year]  Transition of Care [facility]) CM/SW Contact  Salome Arnt, [place] Phone Number: 02/14/2023, 10:11 AM  Clinical Narrative:   Transition of Care [facility]) Screening Note   Patient Details  Name: Jacob Farmer Date of Birth: 1946-03-12   Transition of Care [facility]) CM/SW Contact:    Salome Arnt, LCSW Phone Number: 02/14/2023, 10:12 AM    Transition of Care Department [facility]) has reviewed patient and no TOC needs have been identified at this time. We will continue to monitor patient advancement through interdisciplinary progression rounds. If new patient transition needs arise, please place a TOC consult.         Barriers to Discharge: Continued Medical Work up  Expected Discharge Plan and Services                                               Social Determinants of Health (SDOH) Interventions SDOH Screenings   Food Insecurity: No Food Insecurity (02/13/2023)  Housing: Low Risk  (02/13/2023)  Transportation Needs: No Transportation Needs (02/13/2023)  Utilities: Not At Risk (02/13/2023)  Alcohol Screen: Low Risk  (09/14/2021)  Depression (PHQ2-9): Low Risk  (01/27/2023)  Financial Resource Strain: Low Risk  (09/14/2021)  Physical Activity: Sufficiently Active (09/14/2021)  Social Connections: Moderately Integrated (09/14/2021)  Stress: No Stress Concern Present (09/14/2021)  Tobacco Use: Medium Risk (02/13/2023)    Readmission Risk Interventions     No data to display

## 2023-02-14 NOTE — Progress Notes (Addendum)
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  The patient reports that for the past 2 days he has had increasing shortness of breath with short distance ambulation and noticed some increasing edema in the legs.  He has had some wheezing coughing chest congestion.  He has been having melanotic stools for the past several days as well.  He says has been taking his apixaban regularly.  He denies chest pain symptoms.  He denies palpitations.  He was noted to have guaiac positive stools that were melanotic in appearance.  He had mild peripheral edema in the lower extremities.  His chest x-ray suggested atelectasis versus bronchitis and reactive airways.  His hemoglobin is down to 7.6 from 11 approximately 1 month ago.  He is being transfused 1 unit PRBC given his cardiac history and GI was consulted and planning upper endoscopy.  GI recommended IV Protonix twice daily.  Further recommendations forthcoming.   Assessment and Plan:  Upper GI Bleed  - pt has had about 2 weeks of melanotic stools on apixaban - his Hg has continued to trend down, now 7.6 down from 11 one month ago - he stool is guaiac positive  - continue IV pantoprazole BID  - GI consultation requested for inpatient upper endoscopy - GI planning EGD on 3/3   H/O Gastritis and gastroduodenitis - IV pantoprazole BID as ordered - appreciate GI  consult and recommendations    Acute/chronic blood loss anemia - transfused 1 unit PRBC, Hg up to 8  - endoscopy as above   Paroxysmal Atrial Fibrillation  - he is not on rate controlling medication - holding apixaban in setting of acute GI bleeding   CAD s/p MI and s/p stent x 2 - resume home cardiac medications - no current CP symptoms - monitor telemetry - no further troponin tests needed   Essential hypertension  - resume home BP medication and follow BP   CKD stage IV - stable creatinine - resume home gout medication - renally dose medication as able    Acute Bronchitis  - added mucinex, delsym  - added Duoneb Q6 hours - added doxycycline 100 mg BID x 3 days    Generalized Weakness - multifactorial in setting of anemia, bronchitis - transfuse 1 unit PRBC - when more medically stable will ask for PT assessment    H/O prostate Cancer - resume home medication    Postop s/p 5th left floating osteotomy (01/13/23) - pt is 1 month postop  - follow up with Dr. Posey Pronto his surgeon/podiatrist - weightbearing as tolerated in cam shoe  Hypokalemia  - oral replacement given, recheck in AM    DVT prophylaxis: SCDs Code Status: Full  Family Communication: wife at bedside  Disposition: Status is: Inpatient Remains inpatient appropriate because: intensity of illness    Consultants:  GI Procedures:  EGD tentative for 3/3 Antimicrobials:  Doxycycline 2/29>>   Subjective: Pt was feeling better this morning after PRBC transfusion.  Objective: Vitals:   02/14/23 0755 02/14/23 1000 02/14/23 1300 02/14/23 1401  BP:  (!) 168/73 (!) 149/73   Pulse:  86 82   Resp:   18   Temp:   98.3 F (36.8 C)   TempSrc:   Oral   SpO2: 93% 96% 97% 93%  Weight:      Height:        Intake/Output Summary (Last 24 hours) at 02/14/2023 1541 Last data filed at 02/14/2023 1300 Gross per 24 hour  Intake 746.67 ml  Output 550 ml  Net 196.67 ml   Filed Weights   02/13/23 1333 02/14/23  0334  Weight: 81.5 kg 81.4 kg   Examination:  General exam: Appears calm and comfortable  Respiratory system: Clear to auscultation. Respiratory effort normal. Cardiovascular system: normal S1 & S2 heard. No JVD, murmurs, rubs, gallops or clicks. No pedal edema. Gastrointestinal system: Abdomen is nondistended, soft and nontender. No organomegaly or masses felt. Normal bowel sounds heard. Central nervous system: Alert and oriented. No focal neurological deficits. Extremities: Symmetric 5 x 5 power. Skin: No rashes, lesions or ulcers. Psychiatry: Judgement and insight appear normal. Mood & affect appropriate.   Data Reviewed: I have personally reviewed following labs and imaging studies  CBC: Recent Labs  Lab 02/13/23 0937 02/14/23 0430  WBC 7.4 6.2  HGB 7.6* 8.0*  HCT 24.4* 26.0*  MCV 92.4 93.5  PLT 153 127*    Basic Metabolic Panel: Recent Labs  Lab 02/13/23 1030 02/14/23 0430  NA 138 138  K 3.6 3.2*  CL 109 109  CO2 20* 20*  GLUCOSE 122* 115*  BUN 38* 35*  CREATININE 3.16* 3.06*  CALCIUM 8.6* 7.9*  MG  --  2.1  PHOS  --  4.2    CBG: No results for input(s): "GLUCAP" in the last 168 hours.  Recent Results (from the past 240 hour(s))  Resp panel by RT-PCR (RSV, Flu A&B, Covid) Anterior Nasal Swab     Status: None   Collection Time: 02/13/23  6:05 PM   Specimen: Anterior Nasal Swab  Result Value Ref Range Status   SARS Coronavirus 2 by RT PCR NEGATIVE NEGATIVE Final    Comment: (NOTE) SARS-CoV-2 target nucleic acids are NOT DETECTED.  The SARS-CoV-2 RNA is generally detectable in upper respiratory specimens during the acute phase of infection. The lowest concentration of SARS-CoV-2 viral copies this assay can detect is 138 copies/mL. A negative result does not preclude SARS-Cov-2 infection and should not be used as the sole basis for treatment or other patient management decisions. A negative result may occur with  improper specimen collection/handling,  submission of specimen other than nasopharyngeal swab, presence of viral mutation(s) within the areas targeted by this assay, and inadequate number of viral copies(<138 copies/mL). A negative result must be combined with clinical observations, patient history, and epidemiological information. The expected result is Negative.  Fact Sheet for Patients:  EntrepreneurPulse.com.au  Fact Sheet for Healthcare Providers:  IncredibleEmployment.be  This test is no t yet approved or cleared by the [place] FDA and  has been authorized for detection and/or diagnosis of SARS-CoV-2 by FDA under an Emergency Use Authorization (EUA). This EUA will remain  in effect (meaning this test can be used) for the duration of the COVID-19 declaration under Section 564(b)(1) of the Act, 21 U.S.C.section 360bbb-3(b)(1), unless the authorization is terminated  or revoked sooner.       Influenza A by PCR NEGATIVE NEGATIVE Final   Influenza B by PCR  NEGATIVE NEGATIVE Final    Comment: (NOTE) The Xpert Xpress SARS-CoV-2/FLU/RSV plus assay is intended as an aid in the diagnosis of influenza from Nasopharyngeal swab specimens and should not be used as a sole basis for treatment. Nasal washings and aspirates are unacceptable for Xpert Xpress SARS-CoV-2/FLU/RSV testing.  Fact Sheet for Patients: EntrepreneurPulse.com.au  Fact Sheet for Healthcare Providers: IncredibleEmployment.be  This test is not yet approved or cleared by the [place] FDA and has been authorized for detection and/or diagnosis of SARS-CoV-2 by FDA under an Emergency Use Authorization (EUA). This EUA will remain in effect (meaning this test can be used) for the duration of the COVID-19 declaration under Section 564(b)(1) of the Act, 21 U.S.C. section 360bbb-3(b)(1), unless the authorization is terminated or revoked.     Resp Syncytial Virus by PCR NEGATIVE  NEGATIVE Final    Comment: (NOTE) Fact Sheet for Patients: EntrepreneurPulse.com.au  Fact Sheet for Healthcare Providers: IncredibleEmployment.be  This test is not yet approved or cleared by the [place] FDA and has been authorized for detection and/or diagnosis of SARS-CoV-2 by FDA under an Emergency Use Authorization (EUA). This EUA will remain in effect (meaning this test can be used) for the duration of the COVID-19 declaration under Section 564(b)(1) of the Act, 21 U.S.C. section 360bbb-3(b)(1), unless the authorization is terminated or revoked.  Performed at [facility], [address], [place], [place] [address]      Radiology Studies: [facility] Chest [facility] 1 View  Result Date: 02/13/2023 CLINICAL DATA:  Shortness of breath EXAM: PORTABLE CHEST 1 VIEW COMPARISON:  08/25/2020 FINDINGS: Upper normal heart size for AP technique. Retrocardiac airspace opacity suspicious for pneumonia or atelectasis. Questionable right retrocardiac medial airspace opacity, cannot exclude multilobar pneumonia. Airway thickening is present, suggesting bronchitis or reactive airways disease. No blunting of the lateral costophrenic angles. IMPRESSION: 1. Retrocardiac airspace opacity suspicious for pneumonia or atelectasis. Questionable right retrocardiac airspace opacity, cannot exclude multilobar pneumonia. 2. Airway thickening is present, suggesting bronchitis or reactive airways disease. 3. Upper normal heart size for AP technique. Electronically Signed   By: Van Clines M.D.   On: 02/13/2023 10:05    Scheduled Meds:  allopurinol  50 mg Oral QODAY   amLODipine  10 mg Oral Daily   doxycycline  100 mg Oral Q12H   guaiFENesin  600 mg Oral BID   hydrALAZINE  100 mg Oral BID   ipratropium-albuterol  3 mL Nebulization TID   leflunomide  10 mg Oral Daily   pantoprazole  40 mg Intravenous Q12H   rosuvastatin  20 mg Oral QPM   Continuous Infusions:   LOS: 1 day    Time spent: 38  mins  Trellis Guirguis Wynetta Emery, MD How to contact the [facility] Attending or Consulting provider Ruth or covering provider during after hours [place], for this patient?  Check the care team in [facility] and look for a) attending/consulting [facility] provider listed and b) the [facility] team listed Log into [email or web address] and use [facility]'s universal password to access. If you do not have the password, please contact the hospital operator. Locate the [facility] provider you are looking for under [facility] and page to a number that you can be directly reached. If you still have difficulty reaching the provider, please page the [facility] (Director on Call) for the Hospitalists listed on amion for assistance.  02/14/2023, 3:41 PM

## 2023-02-14 NOTE — Progress Notes (Signed)
Gastroenterology Progress Note   Referring Provider: No ref. provider found Primary Care Physician:  Lindell Spar, MD Primary Gastroenterologist:  Dr. Harvel Quale   Patient ID: Jacob Farmer; [id number]; Sep 18, 1946    Subjective   Still having intermittent difficulty breathing but is on room air with stable vital signs. Did report some melena with BM this morning. Denies reflux, abdominal pain, N/V.    Objective   Vital signs in last 24 hours Temp:  [97.5 F (36.4 C)-98.4 F (36.9 C)] 97.8 F (36.6 C) (03/01 0334) Pulse Rate:  [76-83] 78 (03/01 0334) Resp:  [16-20] 18 (03/01 0334) BP: (139-157)/(69-87) 145/85 (03/01 0334) SpO2:  [93 %-100 %] 93 % (03/01 0755) Weight:  [81.4 kg-81.5 kg] 81.4 kg (03/01 0334) Last BM Date : 02/12/23  Physical Exam General:   Alert and oriented, pleasant Head:  Normocephalic and atraumatic. Eyes:  No icterus, sclera clear. Conjuctiva pink.  Neck:  Supple, without thyromegaly or masses.  Heart:  S1, S2 present, no murmurs noted.  Lungs: Clear to auscultation bilaterally, diminished breath sounds  Abdomen:  Bowel sounds present, soft, non-tender, non-distended. No HSM or hernias noted. No rebound or guarding. No masses appreciated  Msk:  Symmetrical without gross deformities. Normal posture. Neurologic:  Alert and  oriented x4;  grossly normal neurologically. Psych:  Alert and cooperative. Normal mood and affect.  Intake/Output from previous day: 02/29 0701 - 03/01 0700 In: 626.7 [P.O.:240; I.V.:22.7; Blood:364] Out: 300 [Urine:300] Intake/Output this shift: No intake/output data recorded.  Lab Results  Recent Labs    02/13/23 0937 02/14/23 0430  WBC 7.4 6.2  HGB 7.6* 8.0*  HCT 24.4* 26.0*  PLT 153 127*   BMET Recent Labs    02/13/23 1030 02/14/23 0430  NA 138 138  K 3.6 3.2*  CL 109 109  CO2 20* 20*  GLUCOSE 122* 115*  BUN 38* 35*  CREATININE 3.16* 3.06*  CALCIUM 8.6* 7.9*   LFT Recent Labs     02/14/23 0430  ALBUMIN 3.3*   PT/INR No results for input(s): "LABPROT", "INR" in the last 72 hours. Hepatitis Panel No results for input(s): "HEPBSAG", "HCVAB", "HEPAIGM", "HEPBIGM" in the last 72 hours.   Studies/Results DG Chest Port 1 View  Result Date: 02/13/2023 CLINICAL DATA:  Shortness of breath EXAM: PORTABLE CHEST 1 VIEW COMPARISON:  08/25/2020 FINDINGS: Upper normal heart size for AP technique. Retrocardiac airspace opacity suspicious for pneumonia or atelectasis. Questionable right retrocardiac medial airspace opacity, cannot exclude multilobar pneumonia. Airway thickening is present, suggesting bronchitis or reactive airways disease. No blunting of the lateral costophrenic angles. IMPRESSION: 1. Retrocardiac airspace opacity suspicious for pneumonia or atelectasis. Questionable right retrocardiac airspace opacity, cannot exclude multilobar pneumonia. 2. Airway thickening is present, suggesting bronchitis or reactive airways disease. 3. Upper normal heart size for AP technique. Electronically Signed   By: Van Clines M.D.   On: 02/13/2023 10:05   DG Foot Complete Left  Result Date: 01/21/2023 Please see detailed radiograph report in office note.   Assessment  77 y.o. 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 GI consulted for concern for upper GI bleed.   Upper GI bleed: 2 weeks of melena. Seen outpatient by our serice 2/22 without any reported melena and was advised to  restart omeprazole 20 mg once daily. Hgb 11.9 in January but had drop to 9.7 at his nephrology appointment 2 weeks ago. Presented to the ED with Hgb 7.6. Had black heme positive stool on DRE in the ED. Noted history of gastric and cecal AVMs in April 2023. Hgb stable at 8 today. On  eliquis for Afib with last dose yesterday 2/29 AM. Suspected upper GI bleed at this time however will need to wait until Sunday 3/3 to allow for Eliquis washout and improvement in renal function. Cr today 3.06, GFR 20. Still with some melanotic stools today.   Plan / Recommendations  Hold Eliquis PPI IV BID Monitor H/H, transfuse for Hgb <7 Tentative EGD 3/3 with Dr. Jenetta Downer to allow Eliquis washout.  Will be made NPO for Saturday night (3/3 @ 0000). Order in place.     LOS: 1 day    02/14/2023, 8:50 AM   Venetia Night, MSN, FNP-BC, AGACNP-BC [facility]

## 2023-02-15 ENCOUNTER — Inpatient Hospital Stay (HOSPITAL_COMMUNITY): Payer: Medicare HMO

## 2023-02-15 DIAGNOSIS — D6869 Other thrombophilia: Secondary | ICD-10-CM | POA: Diagnosis not present

## 2023-02-15 DIAGNOSIS — J209 Acute bronchitis, unspecified: Secondary | ICD-10-CM | POA: Diagnosis not present

## 2023-02-15 DIAGNOSIS — I480 Paroxysmal atrial fibrillation: Secondary | ICD-10-CM | POA: Diagnosis not present

## 2023-02-15 LAB — CBC
HCT: 25.6 % — ABNORMAL LOW (ref 39.0–52.0)
Hemoglobin: 8.2 g/dL — ABNORMAL LOW (ref 13.0–17.0)
MCH: 30 pg (ref 26.0–34.0)
MCHC: 32 g/dL (ref 30.0–36.0)
MCV: 93.8 fL (ref 80.0–100.0)
Platelets: 133 10*3/uL — ABNORMAL LOW (ref 150–400)
RBC: 2.73 MIL/uL — ABNORMAL LOW (ref 4.22–5.81)
RDW: 16.4 % — ABNORMAL HIGH (ref 11.5–15.5)
WBC: 6.5 10*3/uL (ref 4.0–10.5)
nRBC: 0 % (ref 0.0–0.2)

## 2023-02-15 LAB — RENAL FUNCTION PANEL
Albumin: 3.3 g/dL — ABNORMAL LOW (ref 3.5–5.0)
Anion gap: 10 (ref 5–15)
BUN: 30 mg/dL — ABNORMAL HIGH (ref 8–23)
CO2: 20 mmol/L — ABNORMAL LOW (ref 22–32)
Calcium: 8.3 mg/dL — ABNORMAL LOW (ref 8.9–10.3)
Chloride: 109 mmol/L (ref 98–111)
Creatinine, Ser: 3.16 mg/dL — ABNORMAL HIGH (ref 0.61–1.24)
GFR, Estimated: 19 mL/min — ABNORMAL LOW (ref >60)
Glucose, Bld: 126 mg/dL — ABNORMAL HIGH (ref 70–99)
Phosphorus: 3.5 mg/dL (ref 2.5–4.6)
Potassium: 3.4 mmol/L — ABNORMAL LOW (ref 3.5–5.1)
Sodium: 139 mmol/L (ref 135–145)

## 2023-02-15 MED ORDER — GUAIFENESIN ER 600 MG PO TB12
1200.0000 mg | ORAL_TABLET | Freq: Two times a day (BID) | ORAL | Status: DC
  Administered 2023-02-15 – 2023-02-17 (×3): 1200 mg via ORAL
  Filled 2023-02-15 (×4): qty 2

## 2023-02-15 MED ORDER — METOPROLOL TARTRATE 25 MG PO TABS
12.5000 mg | ORAL_TABLET | Freq: Two times a day (BID) | ORAL | Status: DC
  Administered 2023-02-15 (×2): 12.5 mg via ORAL
  Filled 2023-02-15 (×3): qty 1

## 2023-02-15 MED ORDER — GUAIFENESIN ER 600 MG PO TB12
600.0000 mg | ORAL_TABLET | Freq: Once | ORAL | Status: AC
  Administered 2023-02-15: 600 mg via ORAL
  Filled 2023-02-15: qty 1

## 2023-02-15 MED ORDER — POTASSIUM CHLORIDE CRYS ER 20 MEQ PO TBCR
40.0000 meq | EXTENDED_RELEASE_TABLET | Freq: Once | ORAL | Status: AC
  Administered 2023-02-15: 40 meq via ORAL
  Filled 2023-02-15: qty 2

## 2023-02-15 NOTE — Progress Notes (Signed)
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  The patient reports that for the past 2 days he has had increasing shortness of breath with short distance ambulation and noticed some increasing edema in the legs.  He has had some wheezing coughing chest congestion.  He has been having melanotic stools for the past several days as well.  He says has been taking his apixaban regularly.  He denies chest pain symptoms.  He denies palpitations.  He was noted to have guaiac positive stools that were melanotic in appearance.  He had mild peripheral edema in the lower extremities.  His chest x-ray suggested atelectasis versus bronchitis and reactive airways.  His hemoglobin is down to 7.6 from 11 approximately 1 month ago.  He is being transfused 1 unit PRBC given his cardiac history and GI was consulted and planning upper endoscopy.  GI recommended IV Protonix twice daily.  Further recommendations forthcoming.   Assessment and Plan:  Upper GI Bleed  - pt has had about 2 weeks of melanotic stools on apixaban - his Hg has continued to trend down, now 7.6 down from 11 one month ago - he stool is guaiac positive  - continue IV pantoprazole BID  - GI consultation requested for inpatient upper endoscopy - GI planning EGD on 3/3   H/O Gastritis and gastroduodenitis - IV pantoprazole BID as ordered - appreciate GI  consult and recommendations    Acute/chronic blood loss anemia - transfused 1 unit PRBC, Hg up to 8  - endoscopy as above   Paroxysmal Atrial Fibrillation  - he is not on rate controlling medication - holding apixaban in setting of acute GI bleeding   CAD s/p MI and s/p stent x 2 - resume home cardiac medications - no current CP symptoms - monitor telemetry - troponin was 22    Essential hypertension  - resumed home BP medication and follow BP - suboptimally controlled; - added metoprolol 12.5 mg BID    CKD stage IV - stable creatinine - resume home gout medication - renally dose medication as able    Acute Bronchitis  - added mucinex, delsym  - added Duoneb Q6 hours - added doxycycline 100 mg BID x 3 days  - increased mucinex to 1200 mg BID - added incentive spirometry and added flutter valve    Generalized Weakness - multifactorial in setting of anemia, bronchitis - transfused 1 unit PRBC - when more medically stable will ask for PT assessment likely on 3/3   H/O prostate Cancer - resume home medication    Postop s/p 5th left floating osteotomy (01/13/23) - pt is 1 month postop  - follow up with Dr. Posey Pronto his surgeon/podiatrist - weightbearing as tolerated in cam shoe  Hypokalemia  - oral replacement given, improved to 3.4 - repeat oral K today, recheck in AM     DVT prophylaxis: SCDs Code Status: Full  Family Communication: wife at bedside  Disposition: Status is:  Inpatient Remains inpatient appropriate because: intensity of illness    Consultants:  GI Procedures:  EGD tentative for 3/3 with Dr. Jenetta Downer Antimicrobials:  Doxycycline 2/29>>   Subjective: Pt not able to expectorate mucus deep in chest and having occasional bouts of SOB. He denies chest pain or chest pressure.     Objective: Vitals:   02/14/23 2139 02/15/23 0428 02/15/23 0551 02/15/23 0749  BP: (!) 165/86 (!) 155/77    Pulse: 91 81    Resp: 20 20    Temp: 97.8 F (36.6 C) (!)  97.5 F (36.4 C)    TempSrc: Oral Oral    SpO2: 98% 97%  96%  Weight:   80.4 kg   Height:        Intake/Output Summary (Last 24 hours) at 02/15/2023 1122 Last data filed at 02/15/2023 0900 Gross per 24 hour  Intake 360 ml  Output 725 ml  Net -365 ml   Filed Weights   02/13/23 1333 02/14/23 0334 02/15/23 0551  Weight: 81.5 kg 81.4 kg 80.4 kg   Examination:  General exam: Appears calm and comfortable  Respiratory system: Clear to auscultation. Respiratory effort normal. Cardiovascular system: normal S1 & S2 heard. No JVD, murmurs, rubs, gallops or clicks. No pedal edema. Gastrointestinal system: Abdomen is nondistended, soft and nontender. No organomegaly or masses felt. Normal bowel sounds heard. Central nervous system: Alert and oriented. No focal neurological deficits. Extremities: Symmetric 5 x 5 power. Skin: No rashes, lesions or ulcers. Psychiatry: Judgement and insight appear normal. Mood & affect appropriate.   Data Reviewed: I have personally reviewed following labs and imaging studies  CBC: Recent Labs  Lab 02/13/23 0937 02/14/23 0430 02/15/23 0450  WBC 7.4 6.2 6.5  HGB 7.6* 8.0* 8.2*  HCT 24.4* 26.0* 25.6*  MCV 92.4 93.5 93.8  PLT 153 127* 133*    Basic Metabolic Panel: Recent Labs  Lab 02/13/23 1030 02/14/23 0430 02/15/23 0450  NA 138 138 139  K 3.6 3.2* 3.4*  CL 109 109 109  CO2 20* 20* 20*  GLUCOSE 122* 115* 126*  BUN 38* 35* 30*  CREATININE 3.16* 3.06* 3.16*  CALCIUM 8.6* 7.9* 8.3*  MG  --  2.1  --   PHOS  --  4.2 3.5    CBG: No results for input(s): "GLUCAP" in the last 168 hours.  Recent Results (from the past 240 hour(s))  Resp panel by RT-PCR (RSV, Flu A&B, Covid) Anterior Nasal Swab     Status: None   Collection Time: 02/13/23  6:05 PM   Specimen: Anterior Nasal Swab  Result Value Ref Range Status   SARS Coronavirus 2 by RT PCR NEGATIVE NEGATIVE Final    Comment: (NOTE) SARS-CoV-2 target nucleic acids are NOT DETECTED.  The  SARS-CoV-2 RNA is generally detectable in upper respiratory specimens during the acute phase of infection. The lowest concentration of SARS-CoV-2 viral copies this assay can detect is 138 copies/mL. A negative result does not preclude SARS-Cov-2 infection and should not be used as the sole basis for treatment or other patient management decisions. A negative result may occur with  improper specimen collection/handling, submission of specimen other than nasopharyngeal swab, presence of viral mutation(s) within the areas targeted by this assay, and inadequate number of viral copies(<138 copies/mL). A negative result must be combined with clinical observations, patient history, and epidemiological information. The expected result is Negative.  Fact Sheet for Patients:  EntrepreneurPulse.com.au  Fact Sheet for Healthcare Providers:  IncredibleEmployment.be  This test is no  t yet approved or cleared by the [place] and  has been authorized for detection and/or diagnosis of SARS-CoV-2 by FDA under an Emergency Use Authorization (EUA). This EUA will remain  in effect (meaning this test can be used) for the duration of the COVID-19 declaration under Section 564(b)(1) of the Act, 21 U.S.C.section 360bbb-3(b)(1), unless the authorization is terminated  or revoked sooner.       Influenza A by PCR NEGATIVE NEGATIVE Final   Influenza B by PCR NEGATIVE NEGATIVE Final    Comment: (NOTE) The Xpert Xpress SARS-CoV-2/FLU/RSV plus assay is intended as an aid in the diagnosis of influenza from Nasopharyngeal swab specimens and should not be used as a sole basis for treatment. Nasal washings and aspirates are unacceptable for Xpert Xpress SARS-CoV-2/FLU/RSV testing.  Fact Sheet for Patients: EntrepreneurPulse.com.au  Fact Sheet for Healthcare Providers: IncredibleEmployment.be  This test is not yet approved or  cleared by the [place] FDA and has been authorized for detection and/or diagnosis of SARS-CoV-2 by FDA under an Emergency Use Authorization (EUA). This EUA will remain in effect (meaning this test can be used) for the duration of the COVID-19 declaration under Section 564(b)(1) of the Act, 21 U.S.C. section 360bbb-3(b)(1), unless the authorization is terminated or revoked.     Resp Syncytial Virus by PCR NEGATIVE NEGATIVE Final    Comment: (NOTE) Fact Sheet for Patients: EntrepreneurPulse.com.au  Fact Sheet for Healthcare Providers: IncredibleEmployment.be  This test is not yet approved or cleared by the [place] FDA and has been authorized for detection and/or diagnosis of SARS-CoV-2 by FDA under an Emergency Use Authorization (EUA). This EUA will remain in effect (meaning this test can be used) for the duration of the COVID-19 declaration under Section 564(b)(1) of the Act, 21 U.S.C. section 360bbb-3(b)(1), unless the authorization is terminated or revoked.  Performed at [facility], [address], [place], [place] [address]      Radiology Studies: No results found.  Scheduled Meds:  allopurinol  50 mg Oral QODAY   amLODipine  10 mg Oral Daily   doxycycline  100 mg Oral Q12H   guaiFENesin  1,200 mg Oral BID   guaiFENesin  600 mg Oral Once   hydrALAZINE  100 mg Oral BID   ipratropium-albuterol  3 mL Nebulization TID   leflunomide  10 mg Oral Daily   pantoprazole  40 mg Intravenous Q12H   rosuvastatin  20 mg Oral QPM   Continuous Infusions:   LOS: 2 days   Time spent: 35  mins  Anna Beaird Wynetta Emery, MD How to contact the [facility] Attending or Consulting provider [place] or covering provider during after hours [place], for this patient?  Check the care team in [facility] and look for a) attending/consulting [facility] provider listed and b) the [facility] team listed Log into [email or web address] and use [place]'s universal password to access. If you  do not have the password, please contact the hospital operator. Locate the [facility] provider you are looking for under [facility] and page to a number that you can be directly reached. If you still have difficulty reaching the provider, please page the [facility] (Director on Call) for the Hospitalists listed on amion for assistance.  02/15/2023, 11:22 AM

## 2023-02-15 NOTE — Progress Notes (Signed)
Jacob Farmer, M.D. Gastroenterology & Hepatology   Interval History:  NAEON. The patient reports feeling well although he is still having dyspnea while resting.  His oxygen saturation has been above 94%. States that his bowel movements were dark yesterday but no melena today in the morning in the commode.  No nausea, vomiting, fever or chills. Hemoglobin has remained stable at 8.2.  Inpatient Medications:  Current Facility-Administered Medications:    acetaminophen (TYLENOL) tablet 650 mg, 650 mg, Oral, Q6H PRN **OR** acetaminophen (TYLENOL) suppository 650 mg, 650 mg, Rectal, Q6H PRN, Johnson, Clanford L, MD   allopurinol (ZYLOPRIM) tablet 50 mg, 50 mg, Oral, QODAY, Johnson, Clanford L, MD, 50 mg at 02/14/23 1003   amLODipine (NORVASC) tablet 10 mg, 10 mg, Oral, Daily, Johnson, Clanford L, MD, 10 mg at 02/14/23 1003   bisacodyl (DULCOLAX) EC tablet 5 mg, 5 mg, Oral, Daily PRN, Wynetta Emery, Clanford L, MD   dextromethorphan (DELSYM) 30 MG/5ML liquid 30 mg, 30 mg, Oral, BID PRN, Wynetta Emery, Clanford L, MD, 30 mg at 02/14/23 2230   doxycycline (VIBRA-TABS) tablet 100 mg, 100 mg, Oral, Q12H, Johnson, Clanford L, MD, 100 mg at 02/14/23 2200   fentaNYL (SUBLIMAZE) injection 12.5 mcg, 12.5 mcg, Intravenous, Q2H PRN, Johnson, Clanford L, MD   guaiFENesin (MUCINEX) 12 hr tablet 600 mg, 600 mg, Oral, BID, Johnson, Clanford L, MD, 600 mg at 02/14/23 2200   hydrALAZINE (APRESOLINE) tablet 100 mg, 100 mg, Oral, BID, Johnson, Clanford L, MD, 100 mg at 02/14/23 2200   ipratropium-albuterol (DUONEB) 0.5-2.5 (3) MG/3ML nebulizer solution 3 mL, 3 mL, Nebulization, TID, Johnson, Clanford L, MD, 3 mL at 02/15/23 0749   leflunomide (ARAVA) tablet 10 mg, 10 mg, Oral, Daily, Johnson, Clanford L, MD, 10 mg at 02/14/23 1004   nitroGLYCERIN (NITROSTAT) SL tablet 0.4 mg, 0.4 mg, Sublingual, Q5 min PRN, Johnson, Clanford L, MD   ondansetron (ZOFRAN) tablet 4 mg, 4 mg, Oral, Q6H PRN **OR** ondansetron (ZOFRAN) injection 4  mg, 4 mg, Intravenous, Q6H PRN, Johnson, Clanford L, MD   oxyCODONE (Oxy IR/ROXICODONE) immediate release tablet 5 mg, 5 mg, Oral, Q6H PRN, Wynetta Emery, Clanford L, MD, 5 mg at 02/13/23 1646   pantoprazole (PROTONIX) injection 40 mg, 40 mg, Intravenous, Q12H, Johnson, Clanford L, MD, 40 mg at 02/14/23 2200   potassium chloride SA (KLOR-CON M) CR tablet 40 mEq, 40 mEq, Oral, Once, Johnson, Clanford L, MD   rosuvastatin (CRESTOR) tablet 20 mg, 20 mg, Oral, QPM, Johnson, Clanford L, MD, 20 mg at 02/14/23 1704   traZODone (DESYREL) tablet 25 mg, 25 mg, Oral, QHS PRN, Wynetta Emery, Clanford L, MD   I/O    Intake/Output Summary (Last 24 hours) at 02/15/2023 0826 Last data filed at 02/15/2023 0430 Gross per 24 hour  Intake 120 ml  Output 975 ml  Net -855 ml     Physical Exam: Temp:  [97.5 F (36.4 C)-98.3 F (36.8 C)] 97.5 F (36.4 C) (03/02 0428) Pulse Rate:  [81-91] 81 (03/02 0428) Resp:  [18-20] 20 (03/02 0428) BP: (149-168)/(73-86) 155/77 (03/02 0428) SpO2:  [93 %-98 %] 96 % (03/02 0749) Weight:  [80.4 kg] 80.4 kg (03/02 0551)  Temp (24hrs), Avg:97.9 F (36.6 C), Min:97.5 F (36.4 C), Max:98.3 F (36.8 C) GENERAL: The patient is AO x3, in no acute distress. On [place]. HEENT: Head is normocephalic and atraumatic. EOMI are intact. Mouth is well hydrated and without lesions. NECK: Supple. No masses LUNGS: Clear to auscultation. No presence of rhonchi/wheezing/rales. Adequate chest expansion HEART: RRR, normal s1  and s2. ABDOMEN: Soft, nontender, no guarding, no peritoneal signs, and nondistended. BS +. No masses. EXTREMITIES: Without any cyanosis, clubbing, rash, lesions or edema. NEUROLOGIC: AOx3, no focal motor deficit. SKIN: no jaundice, no rashes  Laboratory Data: CBC:     Component Value Date/Time   WBC 6.5 02/15/2023 0450   RBC 2.73 (L) 02/15/2023 0450   HGB 8.2 (L) 02/15/2023 0450   HCT 25.6 (L) 02/15/2023 0450   PLT 133 (L) 02/15/2023 0450   MCV 93.8 02/15/2023 0450   MCH 30.0  02/15/2023 0450   MCHC 32.0 02/15/2023 0450   RDW 16.4 (H) 02/15/2023 0450   LYMPHSABS 825 (L) 01/01/2023 1346   MONOABS 0.7 11/02/2020 1154   EOSABS 238 01/01/2023 1346   BASOSABS 53 01/01/2023 1346   COAG:  Lab Results  Component Value Date   INR 1.0 11/13/2020   INR 1.04 05/26/2017   INR 1.04 01/04/2016    BMP:     Latest Ref Rng & Units 02/15/2023    4:50 AM 02/14/2023    4:30 AM 02/13/2023   10:30 AM  BMP  Glucose 70 - 99 mg/dL 126  115  122   BUN 8 - 23 mg/dL 30  35  38   Creatinine 0.61 - 1.24 mg/dL 3.16  3.06  3.16   Sodium 135 - 145 mmol/L 139  138  138   Potassium 3.5 - 5.1 mmol/L 3.4  3.2  3.6   Chloride 98 - 111 mmol/L 109  109  109   CO2 22 - 32 mmol/L '20  20  20   '$ Calcium 8.9 - 10.3 mg/dL 8.3  7.9  8.6     HEPATIC:     Latest Ref Rng & Units 02/15/2023    4:50 AM 02/14/2023    4:30 AM 01/01/2023    1:46 PM  Hepatic Function  Total Protein 6.1 - 8.1 g/dL   6.8   Albumin 3.5 - 5.0 g/dL 3.3  3.3    AST 10 - 35 U/L   23   ALT 9 - 46 U/L   19   Total Bilirubin 0.2 - 1.2 mg/dL   0.4     CARDIAC:  Lab Results  Component Value Date   CKTOTAL 43 (L) 11/13/2020      Imaging: I personally reviewed and interpreted the available labs, imaging and endoscopic files.   Assessment/Plan: 77 y.o. 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  The patient was found to have severe anemia with a hemoglobin of 7.6.  Gastroenterology was consulted as he presented episodes of melena for the last 2 weeks.  The patient has been hemodynamically stable and has not presented any more overt gastrointestinal bleeding episodes.  Etiology of current presentation may correspond to peptic ulcer disease versus Dieulafoy versus AVMs.  We will proceed with EGD tomorrow in  the morning to evaluate his upper gastrointestinal tract and possibility of upper GI bleeding once his Eliquis has washed out (at least 72 hours given his CKD).  For now, we will keep him on PPI twice daily.  Notably, the patient had AVMs in his cecum in the past.  It is also possible he may have AVMs in his small bowel.  If his EGD is unremarkable, we may plan  for outpatient capsule endoscopy to evaluate his melena further.  Patient has presented dyspnea since admission.  Chest x-ray showed possible atelectasis and associated bronchitis.  I encouraged him to use the incentive spirometer and we will repeat a chest x-ray today.  - Repeat CBC q day, transfuse if Hb <8 - Pantoprazole 40 mg q12h IVP - 2 large bore IV lines - Active T/S - Keep NPO after midnight - Avoid NSAIDs - Will proceed with EGD tomorrow -Chest x-ray -Incentive spirometer  Jacob Peppers, MD Gastroenterology and [facility]

## 2023-02-15 NOTE — Anesthesia Preprocedure Evaluation (Signed)
Anesthesia Evaluation  Patient identified by MRN, date of birth, ID band Patient awake    Reviewed: Allergy & Precautions, H&P , NPO status , Patient's Chart, lab work & pertinent test results  Airway Mallampati: II  TM Distance: >3 FB Neck ROM: Full   Comment: Herniated cervical disc Dental  (+) Upper Dentures, Lower Dentures   Pulmonary former smoker On oxygen 2l/m in hospital, doesn't use oxygen at home   Pulmonary exam normal breath sounds clear to auscultation       Cardiovascular Exercise Tolerance: Good hypertension, Pt. on medications + CAD and + Cardiac Stents  Normal cardiovascular exam+ dysrhythmias Atrial Fibrillation  Rhythm:Regular Rate:Normal  13-Feb-2023 09:48:23 [facility] ROUTINE RECORD [birth year] (46 yr) Male Caucasian Vent. rate 84 BPM PR interval ms QRS duration 155 ms QT/QTcB 458/542 ms P-R-T axes 268 24 artificat Ventricular premature complex Right bundle branch block Inferior infarct, old Lateral leads are also involved Confirmed by Ronnald Nian, Adam ([id number]) on 02/14/2023 8:57:01 AM   Neuro/Psych negative neurological ROS  negative psych ROS   GI/Hepatic PUD,GERD  Medicated and Controlled,,  Endo/Other  diabetes, Well Controlled, Type 2, Oral Hypoglycemic Agents    Renal/GU Renal InsufficiencyRenal disease Bladder dysfunction (prostate cancer)      Musculoskeletal  (+) Arthritis , Osteoarthritis and Rheumatoid disorders,    Abdominal   Peds negative pediatric ROS (+)  Hematology  (+) Blood dyscrasia, anemia   Anesthesia Other Findings Gout, psoriasis   Reproductive/Obstetrics negative OB ROS                             Anesthesia Physical Anesthesia Plan  ASA: 3  Anesthesia Plan: General   Post-op Pain Management: Minimal or no pain anticipated   Induction: Intravenous  PONV Risk Score and Plan: Propofol infusion and TIVA  Airway  Management Planned: Nasal Cannula and Natural Airway  Additional Equipment:   Intra-op Plan:   Post-operative Plan:   Informed Consent: I have reviewed the patients History and Physical, chart, labs and discussed the procedure including the risks, benefits and alternatives for the proposed anesthesia with the patient or authorized representative who has indicated his/her understanding and acceptance.     Dental advisory given  Plan Discussed with: CRNA and Surgeon  Anesthesia Plan Comments:        Anesthesia Quick Evaluation

## 2023-02-16 ENCOUNTER — Encounter (HOSPITAL_COMMUNITY)
Admission: EM | Disposition: A | Discharge status: Home Health | Payer: Self-pay | Source: Home / Self Care | Attending: Family Medicine

## 2023-02-16 ENCOUNTER — Telehealth (INDEPENDENT_AMBULATORY_CARE_PROVIDER_SITE_OTHER): Payer: Self-pay | Admitting: Gastroenterology

## 2023-02-16 ENCOUNTER — Inpatient Hospital Stay (HOSPITAL_COMMUNITY): Payer: Medicare HMO | Admitting: Anesthesiology

## 2023-02-16 DIAGNOSIS — K2210 Ulcer of esophagus without bleeding: Secondary | ICD-10-CM

## 2023-02-16 DIAGNOSIS — R0600 Dyspnea, unspecified: Secondary | ICD-10-CM | POA: Diagnosis not present

## 2023-02-16 DIAGNOSIS — K2980 Duodenitis without bleeding: Secondary | ICD-10-CM

## 2023-02-16 HISTORY — PX: ESOPHAGOGASTRODUODENOSCOPY (EGD) WITH PROPOFOL: SHX5813

## 2023-02-16 LAB — RENAL FUNCTION PANEL
Albumin: 3.2 g/dL — ABNORMAL LOW (ref 3.5–5.0)
Anion gap: 8 (ref 5–15)
BUN: 31 mg/dL — ABNORMAL HIGH (ref 8–23)
CO2: 19 mmol/L — ABNORMAL LOW (ref 22–32)
Calcium: 8.3 mg/dL — ABNORMAL LOW (ref 8.9–10.3)
Chloride: 112 mmol/L — ABNORMAL HIGH (ref 98–111)
Creatinine, Ser: 3.45 mg/dL — ABNORMAL HIGH (ref 0.61–1.24)
GFR, Estimated: 17 mL/min — ABNORMAL LOW (ref >60)
Glucose, Bld: 109 mg/dL — ABNORMAL HIGH (ref 70–99)
Phosphorus: 3.7 mg/dL (ref 2.5–4.6)
Potassium: 3.8 mmol/L (ref 3.5–5.1)
Sodium: 139 mmol/L (ref 135–145)

## 2023-02-16 LAB — CBC
HCT: 27 % — ABNORMAL LOW (ref 39.0–52.0)
Hemoglobin: 8.3 g/dL — ABNORMAL LOW (ref 13.0–17.0)
MCH: 29 pg (ref 26.0–34.0)
MCHC: 30.7 g/dL (ref 30.0–36.0)
MCV: 94.4 fL (ref 80.0–100.0)
Platelets: 141 10*3/uL — ABNORMAL LOW (ref 150–400)
RBC: 2.86 MIL/uL — ABNORMAL LOW (ref 4.22–5.81)
RDW: 16.4 % — ABNORMAL HIGH (ref 11.5–15.5)
WBC: 7.3 10*3/uL (ref 4.0–10.5)
nRBC: 0 % (ref 0.0–0.2)

## 2023-02-16 SURGERY — ESOPHAGOGASTRODUODENOSCOPY (EGD) WITH PROPOFOL
Anesthesia: General

## 2023-02-16 MED ORDER — SUCRALFATE 1 GM/10ML PO SUSP
1.0000 g | Freq: Three times a day (TID) | ORAL | Status: DC
  Administered 2023-02-16 – 2023-02-17 (×4): 1 g via ORAL
  Filled 2023-02-16 (×5): qty 10

## 2023-02-16 MED ORDER — PROPOFOL 10 MG/ML IV BOLUS
INTRAVENOUS | Status: AC
  Filled 2023-02-16: qty 20

## 2023-02-16 MED ORDER — PANTOPRAZOLE SODIUM 40 MG PO TBEC
40.0000 mg | DELAYED_RELEASE_TABLET | Freq: Two times a day (BID) | ORAL | Status: DC
  Administered 2023-02-16 – 2023-02-17 (×3): 40 mg via ORAL
  Filled 2023-02-16 (×3): qty 1

## 2023-02-16 MED ORDER — SODIUM CHLORIDE 0.9 % IV SOLN: INTRAVENOUS | Status: DC

## 2023-02-16 MED ORDER — APIXABAN 5 MG PO TABS
5.0000 mg | ORAL_TABLET | Freq: Two times a day (BID) | ORAL | Status: DC
  Administered 2023-02-16 – 2023-02-17 (×3): 5 mg via ORAL
  Filled 2023-02-16 (×3): qty 1

## 2023-02-16 MED ORDER — LIDOCAINE HCL (PF) 2 % IJ SOLN
INTRAMUSCULAR | Status: AC
  Filled 2023-02-16: qty 5

## 2023-02-16 MED ORDER — LIDOCAINE HCL (CARDIAC) PF 100 MG/5ML IV SOSY
PREFILLED_SYRINGE | INTRAVENOUS | Status: DC | PRN
  Administered 2023-02-16: 100 mg via INTRATRACHEAL

## 2023-02-16 MED ORDER — PROPOFOL 500 MG/50ML IV EMUL
INTRAVENOUS | Status: DC | PRN
  Administered 2023-02-16: 150 ug/kg/min via INTRAVENOUS

## 2023-02-16 MED ORDER — PROPOFOL 10 MG/ML IV BOLUS
INTRAVENOUS | Status: DC | PRN
  Administered 2023-02-16: 80 mg via INTRAVENOUS

## 2023-02-16 MED ORDER — LACTATED RINGERS IV SOLN: INTRAVENOUS | Status: DC | PRN

## 2023-02-16 NOTE — Brief Op Note (Signed)
02/13/2023 - 02/16/2023  9:26 AM  PATIENT:  Jacob Farmer  77 y.o. male  PRE-OPERATIVE DIAGNOSIS:  UGI bleed, melena, anemia  POST-OPERATIVE DIAGNOSIS:  gastritis; esophageal ulcer beween 35-32;  PROCEDURE:  Procedure(s): ESOPHAGOGASTRODUODENOSCOPY (EGD) WITH PROPOFOL (N/A)  SURGEON:  Surgeon(s) and Role:    * Harvel Quale, MD - Primary  Patient underwent EGD under propofol sedation.  Tolerated the procedure adequately.    -One cratered esophageal ulcer with no stigmata of recent bleeding was found 32 to 35 cm from the incisors. The ulcer had a yellowish coating covering it. The lesion was 3 mm in largest dimension.  -Localized mild inflammation characterized by congestion (edema) and erythema was found in the gastric antrum. There were few specks of old hematin. -Patchy inflammation characterized by congestion (edema) and erythema was found in the first portion of the duodenum.   RECOMMENDATIONS - Return patient to hospital ward for ongoing care.  - Resume previous diet.  - Repeat upper endoscopy in 6 weeks to check healing.  - Use Protonix (pantoprazole) 40 mg PO BID.  - Use sucralfate suspension 1 gram PO QID for 1 month.  - Restart Eliquis today.  Maylon Peppers, MD Gastroenterology and Hepatology [facility] Gastroenterology

## 2023-02-16 NOTE — Transfer of Care (Signed)
Immediate Anesthesia Transfer of Care Note  Patient: Jacob Farmer  Procedure(s) Performed: ESOPHAGOGASTRODUODENOSCOPY (EGD) WITH PROPOFOL  Patient Location: PACU  Anesthesia Type:General  Level of Consciousness: awake, drowsy, patient cooperative, and responds to stimulation  Airway & Oxygen Therapy: Patient Spontanous Breathing and Patient connected to nasal cannula oxygen  Post-op Assessment: Report given to RN and Post -op Vital signs reviewed and stable  Post vital signs: Reviewed and stable  Last Vitals:  Vitals Value Taken Time  BP 115/56 02/16/23 0926  Temp 98.2 02/16/23   0926  Pulse 61 02/16/23 0926  Resp 19 02/16/23 0926  SpO2 97 % 02/16/23 0926  Vitals shown include unvalidated device data.  Last Pain:  Vitals:   02/16/23 0854  TempSrc: Oral  PainSc: 0-No pain      Patients Stated Pain Goal: 7 [phone]  Complications: No notable events documented.

## 2023-02-16 NOTE — Progress Notes (Signed)
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  The patient reports that for the past 2 days he has had increasing shortness of breath with short distance ambulation and noticed some increasing edema in the legs.  He has had some wheezing coughing chest congestion.  He has been having melanotic stools for the past several days as well.  He says has been taking his apixaban regularly.  He denies chest pain symptoms.  He denies palpitations.  He was noted to have guaiac positive stools that were melanotic in appearance.  He had mild peripheral edema in the lower extremities.  His chest x-ray suggested atelectasis versus bronchitis and reactive airways.  His hemoglobin is down to 7.6 from 11 approximately 1 month ago.  He is being transfused 1 unit PRBC given his cardiac history and GI was consulted and planning upper endoscopy.  GI recommended IV Protonix twice daily.  Further recommendations forthcoming.   Assessment and Plan:  Upper GI Bleed  - pt has had about 2 weeks of melanotic stools on apixaban - his Hg has continued to trend down, now 7.6 down from 11 one month ago - he stool was guaiac positive  - he was treated with IV pantoprazole BID  - GI consultation requested for inpatient upper endoscopy - GI did EGD on 3/3 with findings of cratered esophageal ulcer with no stigmata of recent bleeding;  -  continue protonix 40 mg BID per GI  - sucralfate 1 g po QID x 1 month per GI - can restart apixaban 3/3 per GI    H/O Gastritis and gastroduodenitis - IV pantoprazole BID as ordered - appreciate GI consult and recommendations    Acute/chronic blood loss anemia - transfused 1 unit PRBC, Hg up to 8  - endoscopy as above   Paroxysmal Atrial Fibrillation  - he is not on rate controlling medication - initially held apixaban - after endoscopy, GI says we can restart apixaban 3/3   CAD s/p MI and s/p stent x 2 - resume home cardiac medications - no current CP symptoms - monitor telemetry - troponin was 22    Essential hypertension  - resumed home BP medication and follow BP - suboptimally controlled; - doesn't seem to tolerate beta blockers, dc metoprolol due to AVB   CKD stage IV - stable creatinine - resume home gout medication - renally dose medication as able    Acute Bronchitis  - added mucinex, delsym  - added Duoneb Q6 hours - added doxycycline 100 mg BID x 3 days  - increased mucinex to 1200 mg BID - added incentive spirometry and added flutter valve    Generalized Weakness - multifactorial in setting of anemia, bronchitis - transfused 1 unit PRBC - PT assessment requested   H/O prostate Cancer - resume home medication    Postop s/p 5th left floating osteotomy (01/13/23) - pt is 1 month postop  - follow up with Dr. Posey Pronto his surgeon/podiatrist -  weightbearing as tolerated in cam shoe  Hypokalemia  - oral replacement given and repleted     DVT prophylaxis: SCDs Code Status: Full  Family Communication: wife at bedside  Disposition: Status is: Inpatient Remains inpatient appropriate because: intensity of illness    Consultants:  GI Procedures:  EGD tentative for 3/3 with Dr. Jenetta Downer Antimicrobials:  Doxycycline 2/29>>   Subjective: Pt denies melena.  He denies palpitations.  Occasional chest congestion and SOB.   Objective: Vitals:   02/16/23  0854 02/16/23 0924 02/16/23 0930 02/16/23 0945  BP: (!) 149/71 (!) 115/56 115/60 135/69  Pulse: 75 64 61 64  Resp: '16 18 12 18  '$ Temp: 97.7 F (36.5 C) 98.2 F (36.8 C)    TempSrc: Oral     SpO2: 100% 97% 97% 97%  Weight:      Height:        Intake/Output Summary (Last 24 hours) at 02/16/2023 1025 Last data filed at 02/16/2023 [id number] Gross per 24 hour  Intake 500 ml  Output --  Net 500 ml   Filed Weights   02/14/23 0334 02/15/23 0551 02/16/23 0436  Weight: 81.4 kg 80.4 kg 80.5 kg   Examination:  General exam: Appears calm and comfortable  Respiratory system: Clear to auscultation. Respiratory effort normal. Cardiovascular system: normal S1 & S2 heard. No JVD, murmurs, rubs, gallops or clicks. No pedal edema. Gastrointestinal system: Abdomen is nondistended, soft and nontender. No organomegaly or masses felt. Normal bowel sounds heard. Central nervous system: Alert and oriented. No focal neurological deficits. Extremities: Symmetric 5 x 5 power. Skin: No rashes, lesions or ulcers. Psychiatry: Judgement and insight appear normal. Mood & affect appropriate.   Data Reviewed: I have personally reviewed following labs and imaging studies  CBC: Recent Labs  Lab 02/13/23 0937 02/14/23 0430 02/15/23 0450 02/16/23 0503  WBC 7.4 6.2 6.5 7.3  HGB 7.6* 8.0* 8.2* 8.3*  HCT 24.4* 26.0* 25.6* 27.0*  MCV 92.4 93.5 93.8 94.4  PLT 153 127* 133* 141*    Basic Metabolic Panel: Recent Labs  Lab 02/13/23 1030 02/14/23 0430 02/15/23 0450 02/16/23 0503  NA 138 138 139 139  K 3.6 3.2* 3.4* 3.8  CL 109 109 109 112*  CO2 20* 20* 20* 19*  GLUCOSE 122* 115* 126* 109*  BUN 38* 35* 30* 31*  CREATININE 3.16* 3.06* 3.16* 3.45*  CALCIUM 8.6* 7.9* 8.3* 8.3*  MG  --  2.1  --   --   PHOS  --  4.2 3.5 3.7    CBG: No results for input(s): "GLUCAP" in the last 168 hours.  Recent Results (from the past 240 hour(s))  Resp panel by RT-PCR (RSV, Flu A&B, Covid) Anterior Nasal Swab     Status:  None   Collection Time: 02/13/23  6:05 PM   Specimen: Anterior Nasal Swab  Result Value Ref Range Status   SARS Coronavirus 2 by RT PCR NEGATIVE NEGATIVE Final    Comment: (NOTE) SARS-CoV-2 target nucleic acids are NOT DETECTED.  The SARS-CoV-2 RNA is generally detectable in upper respiratory specimens during the acute phase of infection. The lowest concentration of SARS-CoV-2 viral copies this assay can detect is 138 copies/mL. A negative result does not preclude SARS-Cov-2 infection and should not be used as the sole basis for treatment or other patient management decisions. A negative result may occur with  improper specimen collection/handling, submission of specimen other than nasopharyngeal swab, presence of viral mutation(s) within the areas targeted by this assay, and inadequate number of  viral copies(<138 copies/mL). A negative result must be combined with clinical observations, patient history, and epidemiological information. The expected result is Negative.  Fact Sheet for Patients:  EntrepreneurPulse.com.au  Fact Sheet for Healthcare Providers:  IncredibleEmployment.be  This test is no t yet approved or cleared by the [place] FDA and  has been authorized for detection and/or diagnosis of SARS-CoV-2 by FDA under an Emergency Use Authorization (EUA). This EUA will remain  in effect (meaning this test can be used) for the duration of the COVID-19 declaration under Section 564(b)(1) of the Act, 21 U.S.C.section 360bbb-3(b)(1), unless the authorization is terminated  or revoked sooner.       Influenza A by PCR NEGATIVE NEGATIVE Final   Influenza B by PCR NEGATIVE NEGATIVE Final    Comment: (NOTE) The Xpert Xpress SARS-CoV-2/FLU/RSV plus assay is intended as an aid in the diagnosis of influenza from Nasopharyngeal swab specimens and should not be used as a sole basis for treatment. Nasal washings and aspirates are unacceptable  for Xpert Xpress SARS-CoV-2/FLU/RSV testing.  Fact Sheet for Patients: EntrepreneurPulse.com.au  Fact Sheet for Healthcare Providers: IncredibleEmployment.be  This test is not yet approved or cleared by the [place] FDA and has been authorized for detection and/or diagnosis of SARS-CoV-2 by FDA under an Emergency Use Authorization (EUA). This EUA will remain in effect (meaning this test can be used) for the duration of the COVID-19 declaration under Section 564(b)(1) of the Act, 21 U.S.C. section 360bbb-3(b)(1), unless the authorization is terminated or revoked.     Resp Syncytial Virus by PCR NEGATIVE NEGATIVE Final    Comment: (NOTE) Fact Sheet for Patients: EntrepreneurPulse.com.au  Fact Sheet for Healthcare Providers: IncredibleEmployment.be  This test is not yet approved or cleared by the [place] FDA and has been authorized for detection and/or diagnosis of SARS-CoV-2 by FDA under an Emergency Use Authorization (EUA). This EUA will remain in effect (meaning this test can be used) for the duration of the COVID-19 declaration under Section 564(b)(1) of the Act, 21 U.S.C. section 360bbb-3(b)(1), unless the authorization is terminated or revoked.  Performed at [facility], [address], [place], [place] [address]      Radiology Studies: DG CHEST PORT 1 VIEW  Result Date: 02/15/2023 CLINICAL DATA:  Shortness of breath EXAM: PORTABLE CHEST 1 VIEW COMPARISON:  Chest x-ray 02/13/2023 FINDINGS: Enlarged cardiopericardial silhouette with tortuous ectatic aorta. Vascular congestion. No pneumothorax. Persistent mild right basilar atelectasis. More confluence left retrocardiac opacity with small effusion. Overlapping cardiac leads. IMPRESSION: 1.  No significant interval change when adjusting for technique Electronically Signed   By: Jill Side M.D.   On: 02/15/2023 12:00    Scheduled Meds:  [MAR  Hold] allopurinol  50 mg Oral QODAY   [MAR Hold] amLODipine  10 mg Oral Daily   apixaban  5 mg Oral BID   [MAR Hold] guaiFENesin  1,200 mg Oral BID   [MAR Hold] hydrALAZINE  100 mg Oral BID   [MAR Hold] ipratropium-albuterol  3 mL Nebulization TID   [MAR Hold] leflunomide  10 mg Oral Daily   [MAR Hold] metoprolol tartrate  12.5 mg Oral BID   [MAR Hold] pantoprazole  40 mg Intravenous Q12H   [MAR Hold] rosuvastatin  20 mg Oral QPM   sucralfate  1 g Oral TID WC & HS   Continuous Infusions:  sodium chloride      LOS: 3 days   Time spent: 35 mins  Jadelynn Boylan Wynetta Emery, MD How to contact the [facility] Attending  or Consulting provider [place] or covering provider during after hours [place], for this patient?  Check the care team in [facility] and look for a) attending/consulting [facility] provider listed and b) the [facility] team listed Log into [email or web address] and use [place]'s universal password to access. If you do not have the password, please contact the hospital operator. Locate the [facility] provider you are looking for under [facility] and page to a number that you can be directly reached. If you still have difficulty reaching the provider, please page the [facility] (Director on Call) for the Hospitalists listed on amion for assistance.  02/16/2023, 10:25 AM

## 2023-02-16 NOTE — Progress Notes (Signed)
We will proceed with EGD as scheduled.  I thoroughly discussed with the patient the procedure, including the risks involved. Patient understands what the procedure involves including the benefits and any risks. Patient understands alternatives to the proposed procedure. Risks including (but not limited to) bleeding, tearing of the lining (perforation), rupture of adjacent organs, problems with heart and lung function, infection, and medication reactions. A small percentage of complications may require surgery, hospitalization, repeat endoscopic procedure, and/or transfusion.  Patient understood and agreed.  Suman Trivedi Castaneda, MD Gastroenterology and Hepatology [facility]  

## 2023-02-16 NOTE — Anesthesia Postprocedure Evaluation (Signed)
Anesthesia Post Note  Patient: Jacob Farmer  Procedure(s) Performed: ESOPHAGOGASTRODUODENOSCOPY (EGD) WITH PROPOFOL  Patient location during evaluation: PACU Anesthesia Type: General Level of consciousness: awake and alert and oriented Pain management: pain level controlled Vital Signs Assessment: post-procedure vital signs reviewed and stable Respiratory status: spontaneous breathing, nonlabored ventilation, respiratory function stable and patient connected to nasal cannula oxygen Cardiovascular status: blood pressure returned to baseline and stable Postop Assessment: no apparent nausea or vomiting Anesthetic complications: no  No notable events documented.   Last Vitals:  Vitals:   02/16/23 0924 02/16/23 0930  BP: (!) 115/56 115/60  Pulse: 64 61  Resp: 18 12  Temp: 36.8 C   SpO2: 97% 97%    Last Pain:  Vitals:   02/16/23 0924  TempSrc:   PainSc: 0-No pain                 Talaysha Freeberg C Melvin Whiteford

## 2023-02-16 NOTE — Telephone Encounter (Signed)
Hi Mitzie,  Can you please schedule a follow up appointment for this patient in 2-3 weeks with any of the APPs or with me?  Thanks,  Maylon Peppers, MD Gastroenterology and Hepatology [facility]

## 2023-02-16 NOTE — Op Note (Signed)
[facility] Patient Name: Jacob Farmer Procedure Date: 02/16/2023 8:26 AM MRN: [id number] Date of Birth: [birth year] Attending MD: Maylon Peppers , , [id number] CSN: [id number] Age: 77 Admit Type: Inpatient Procedure:                Upper GI endoscopy Indications:              Melena Providers:                Maylon Peppers, Lurline Del, RN, Casimer Bilis, Technician Referring MD:              Medicines:                Monitored Anesthesia Care Complications:            No immediate complications. Estimated Blood Loss:     Estimated blood loss: none. Procedure:                Pre-Anesthesia Assessment:                           - Prior to the procedure, a History and Physical                            was performed, and patient medications, allergies                            and sensitivities were reviewed. The patient's                            tolerance of previous anesthesia was reviewed.                           - The risks and benefits of the procedure and the                            sedation options and risks were discussed with the                            patient. All questions were answered and informed                            consent was obtained.                           - ASA Grade Assessment: III - A patient with severe                            systemic disease.                           After obtaining informed consent, the endoscope was                            passed under direct vision. Throughout the  procedure, the patient's blood pressure, pulse, and                            oxygen saturations were monitored continuously. The                            [id number]) scope was introduced through the                            mouth, and advanced to the second part of duodenum.                            The upper GI endoscopy was accomplished without                             difficulty. The patient tolerated the procedure                            well. Scope In: 9:08:26 AM Scope Out: 9:17:07 AM Total Procedure Duration: 0 hours 8 minutes 41 seconds  Findings:      One cratered esophageal ulcer with no stigmata of recent bleeding was       found 32 to 35 cm from the incisors. The ulcer had a yellowish coating       covering it. The lesion was 3 mm in largest dimension.      Localized mild inflammation characterized by congestion (edema) and       erythema was found in the gastric antrum. There were few specks of old       hematin.      Patchy inflammation characterized by congestion (edema) and erythema was       found in the first portion of the duodenum. Impression:               - Esophageal ulcer with no stigmata of recent                            bleeding.                           - Gastritis.                           - Duodenitis.                           - No specimens collected. Moderate Sedation:      Per Anesthesia Care Recommendation:           - Return patient to hospital ward for ongoing care.                           - Resume previous diet.                           - Repeat upper endoscopy in 6 weeks to check  healing.                           - Use Protonix (pantoprazole) 40 mg PO BID.                           - Use sucralfate suspension 1 gram PO QID for 1                            month.                           - Restart Eliquis today. Procedure Code(s):        --- Professional ---                           [phone], Esophagogastroduodenoscopy, flexible,                            transoral; diagnostic, including collection of                            specimen(s) by brushing or washing, when performed                            (separate procedure) Diagnosis Code(s):        --- Professional ---                           K22.10, Ulcer of esophagus without bleeding                            K29.70, Gastritis, unspecified, without bleeding                           K29.80, Duodenitis without bleeding                           K92.1, Melena (includes Hematochezia) CPT copyright 2022 American Medical Association. All rights reserved. The codes documented in this report are preliminary and upon coder review may  be revised to meet current compliance requirements. Maylon Peppers, MD Maylon Peppers,  02/16/2023 9:27:14 AM This report has been signed electronically. Number of Addenda: 0

## 2023-02-17 ENCOUNTER — Ambulatory Visit: Payer: Medicare HMO | Admitting: Dermatology

## 2023-02-17 DIAGNOSIS — R531 Weakness: Secondary | ICD-10-CM | POA: Diagnosis not present

## 2023-02-17 DIAGNOSIS — D6869 Other thrombophilia: Secondary | ICD-10-CM | POA: Diagnosis not present

## 2023-02-17 DIAGNOSIS — I480 Paroxysmal atrial fibrillation: Secondary | ICD-10-CM | POA: Diagnosis not present

## 2023-02-17 LAB — CBC
HCT: 27.7 % — ABNORMAL LOW (ref 39.0–52.0)
Hemoglobin: 8.5 g/dL — ABNORMAL LOW (ref 13.0–17.0)
MCH: 28.9 pg (ref 26.0–34.0)
MCHC: 30.7 g/dL (ref 30.0–36.0)
MCV: 94.2 fL (ref 80.0–100.0)
Platelets: 137 10*3/uL — ABNORMAL LOW (ref 150–400)
RBC: 2.94 MIL/uL — ABNORMAL LOW (ref 4.22–5.81)
RDW: 16.2 % — ABNORMAL HIGH (ref 11.5–15.5)
WBC: 8.1 10*3/uL (ref 4.0–10.5)
nRBC: 0 % (ref 0.0–0.2)

## 2023-02-17 MED ORDER — PANTOPRAZOLE SODIUM 40 MG PO TBEC: 40.0000 mg | DELAYED_RELEASE_TABLET | Freq: Two times a day (BID) | ORAL | 2 refills | Status: DC

## 2023-02-17 MED ORDER — TRIAMCINOLONE ACETONIDE 0.1 % EX CREA: 1.0000 | TOPICAL_CREAM | Freq: Every day | CUTANEOUS | Status: DC

## 2023-02-17 MED ORDER — SUCRALFATE 1 GM/10ML PO SUSP: 1.0000 g | Freq: Three times a day (TID) | ORAL | 0 refills | Status: DC

## 2023-02-17 NOTE — Evaluation (Signed)
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  The patient reports that for the past 2 days he has had increasing shortness of breath with short distance ambulation and noticed some increasing edema in the legs.  He has had some wheezing coughing chest congestion.  He has been having melanotic stools for the past several days as well.  He says has been taking his apixaban regularly.  He denies chest pain symptoms.  He denies palpitations.     He was noted to have guaiac positive stools that were melanotic in appearance.  He had mild peripheral edema in the lower extremities.  His chest x-ray suggested atelectasis versus bronchitis and reactive airways.  His hemoglobin is down to 7.6 from 11 approximately 1 month ago.  He is being transfused 1 unit PRBC given his cardiac history and GI was consulted and planning upper endoscopy.  GI recommended IV Protonix twice daily.  Further recommendations forthcoming.   Clinical Impression  Patient has to lean on nearby objects for support when taking steps without AD, required use of RW with good carryover demonstrating and limited for gait training mostly due to fatigue.  Patient encouraged to ambulate daily as tolerated with spouse supervising for length of stay in hospital and when he return home.  PLAN:  Patient to be discharged home today and discharged from acute physical therapy to care of nursing for ambulation as tolerated for  length of stay with recommendations stated below      Recommendations for follow up therapy are one component of a multi-disciplinary discharge planning process, led by the attending physician.  Recommendations may be updated based on patient status, additional functional criteria and insurance authorization.  Follow Up Recommendations Home health PT      Assistance Recommended at Discharge Set up Supervision/Assistance  Patient can return home with the following  Help with stairs or ramp for entrance;A little help with bathing/dressing/bathroom;A little help with walking and/or transfers;Assistance with cooking/housework    Equipment Recommendations None recommended by PT  Recommendations for Other Services       Functional Status Assessment Patient has had a recent decline in their functional status and demonstrates the ability to make significant improvements in function in a reasonable and predictable amount of time.     Precautions / Restrictions Precautions Precautions: Fall Restrictions Weight Bearing Restrictions: No      Mobility  Bed Mobility Overal bed mobility: Modified Independent                  Transfers Overall transfer level: Modified independent                      Ambulation/Gait Ambulation/Gait assistance: Supervision Gait Distance (Feet): 50 Feet Assistive device: Rolling walker (2 wheels), None, 1 person hand held assist Gait Pattern/deviations: Decreased step length - right, Decreased step length - left, Decreased stride length Gait velocity: decreased     General Gait Details: slow labored movement having to lean on nearby objects for support when not using  an AD, safer using RW with good return for use demonstrated, limited mostly due to fatigue  Stairs            Wheelchair Mobility    Modified Rankin (Stroke Patients Only)       Balance Overall balance assessment: Needs assistance Sitting-balance support: Feet  supported, No upper extremity supported Sitting balance-Leahy Scale: Good Sitting balance - Comments: seated at EOB   Standing balance support: During functional activity, No upper extremity supported Standing balance-Leahy Scale: Poor Standing balance comment: fair/poor without AD, fair/good using RW                             Pertinent Vitals/Pain Pain Assessment Pain Assessment: No/denies pain    Home Living Family/patient expects to be discharged to:: Private residence Living Arrangements: Spouse/significant other Available Help at Discharge: Family;Available 24 hours/day Type of Home: House Home Access: Stairs to enter Entrance Stairs-Rails: None Entrance Stairs-Number of Steps: 2   Home Layout: One level Home Equipment: Rollator (4 wheels);Shower seat - built in;Cane - single Barista (2 wheels)      Prior Function Prior Level of Function : Independent/Modified Independent;Driving             Mobility Comments: Community ambulator without AD, drives ADLs Comments: Independent     Hand Dominance        Extremity/Trunk Assessment   Upper Extremity Assessment Upper Extremity Assessment: Overall WFL for tasks assessed    Lower Extremity Assessment Lower Extremity Assessment: Generalized weakness    Cervical / Trunk Assessment Cervical / Trunk Assessment: Normal  Communication   Communication: No difficulties  Cognition Arousal/Alertness: Awake/alert Behavior During Therapy: WFL for tasks assessed/performed Overall Cognitive Status: Within Functional Limits for tasks assessed                                          General Comments      Exercises     Assessment/Plan    PT Assessment All further PT needs can be met in the next venue of care  PT Problem List Decreased strength;Decreased activity tolerance;Decreased balance;Decreased mobility       PT Treatment Interventions      PT Goals (Current  goals can be found in the Care Plan section)  Acute Rehab PT Goals Patient Stated Goal: return home with family to assist PT Goal Formulation: With patient/family Time For Goal Achievement: 02/17/23 Potential to Achieve Goals: Good    Frequency       Co-evaluation               AM-PAC PT "6 Clicks" Mobility  Outcome Measure Help needed turning from your back to your side while in a flat bed without using bedrails?: None Help needed moving from lying on your back to sitting on the side of a flat bed without using bedrails?: None Help needed moving to and from a bed to a chair (including a wheelchair)?: None Help needed standing up from a chair using your arms (e.g., wheelchair or bedside chair)?: None Help needed to walk in hospital room?: A Little Help needed climbing 3-5 steps with a railing? : A Little 6 Click Score: 22    End of Session   Activity Tolerance: Patient tolerated treatment well;Patient limited by fatigue Patient left: in chair;with call bell/phone within reach;with family/visitor present  Nurse Communication: Mobility status PT Visit Diagnosis: Unsteadiness on feet (R26.81);Other abnormalities of gait and mobility (R26.89);Muscle weakness (generalized) (M62.81)    Time: [id number] PT Time Calculation (min) (ACUTE ONLY): 12 min   Charges:   PT Evaluation $PT Eval Low Complexity: 1 Low PT Treatments $Therapeutic Activity: 8-22 mins        11:21 AM, 02/17/23 Lonell Grandchild, MPT Physical Therapist with [facility] [phone] office [phone] mobile phone

## 2023-02-17 NOTE — Discharge Instructions (Signed)
IMPORTANT INFORMATION: PAY CLOSE ATTENTION   PHYSICIAN DISCHARGE INSTRUCTIONS  Follow with Primary care provider  Patel, Rutwik K, MD  and other consultants as instructed by your Hospitalist Physician  SEEK MEDICAL CARE OR RETURN TO EMERGENCY ROOM IF SYMPTOMS COME BACK, WORSEN OR NEW PROBLEM DEVELOPS   Please note: You were cared for by a hospitalist during your hospital stay. Every effort will be made to forward records to your primary care provider.  You can request that your primary care provider send for your hospital records if they have not received them.  Once you are discharged, your primary care physician will handle any further medical issues. Please note that NO REFILLS for any discharge medications will be authorized once you are discharged, as it is imperative that you return to your primary care physician (or establish a relationship with a primary care physician if you do not have one) for your post hospital discharge needs so that they can reassess your need for medications and monitor your lab values.  Please get a complete blood count and chemistry panel checked by your Primary MD at your next visit, and again as instructed by your Primary MD.  Get Medicines reviewed and adjusted: Please take all your medications with you for your next visit with your Primary MD  Laboratory/radiological data: Please request your Primary MD to go over all hospital tests and procedure/radiological results at the follow up, please ask your primary care provider to get all Hospital records sent to his/her office.  In some cases, they will be blood work, cultures and biopsy results pending at the time of your discharge. Please request that your primary care provider follow up on these results.  If you are diabetic, please bring your blood sugar readings with you to your follow up appointment with primary care.    Please call and make your follow up appointments as soon as possible.    Also Note  the following: If you experience worsening of your admission symptoms, develop shortness of breath, life threatening emergency, suicidal or homicidal thoughts you must seek medical attention immediately by calling 911 or calling your MD immediately  if symptoms less severe.  You must read complete instructions/literature along with all the possible adverse reactions/side effects for all the Medicines you take and that have been prescribed to you. Take any new Medicines after you have completely understood and accpet all the possible adverse reactions/side effects.   Do not drive when taking Pain medications or sleeping medications (Benzodiazepines)  Do not take more than prescribed Pain, Sleep and Anxiety Medications. It is not advisable to combine anxiety,sleep and pain medications without talking with your primary care practitioner  Special Instructions: If you have smoked or chewed Tobacco  in the last 2 yrs please stop smoking, stop any regular Alcohol  and or any Recreational drug use.  Wear Seat belts while driving.  Do not drive if taking any narcotic, mind altering or controlled substances or recreational drugs or alcohol.       

## 2023-02-17 NOTE — Discharge Summary (Signed)
Physician Discharge Summary  Jacob Farmer [id number] DOB: Jun 14, 1946 DOA: 02/13/2023  PCP: Lindell Spar, MD GI: [facility]   Admit date: 02/13/2023 Discharge date: 02/17/2023  Admitted From:  Home  Disposition: home with [facility]   Recommendations for Outpatient Follow-up:  Follow up with PCP in 1 weeks Follow up with GI in 2-3 weeks  Please obtain CBC in 1-2 weeks Please follow up with GI for repeat EGD in 6 weeks   Home Health:  PT, RN   Discharge Condition: STABLE   CODE STATUS: FULL DIET:  resume heart healthy  Brief Hospitalization Summary: Please see all hospital notes, images, labs for full details of the hospitalization. 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  The patient reports that for the past 2 days he has had increasing shortness of breath with short distance ambulation and noticed some increasing edema in the legs.  He has had some wheezing coughing chest congestion.  He has been having melanotic stools for the past several days as well.  He says has been taking his apixaban regularly.  He denies chest pain symptoms.  He denies palpitations.   He was noted to have guaiac positive stools that were melanotic in appearance.  He had mild peripheral edema in the lower extremities.  His chest x-ray suggested atelectasis versus bronchitis and reactive airways.  His hemoglobin is down to 7.6 from 11 approximately 1 month ago.  He is being transfused 1 unit PRBC given his cardiac history and GI was consulted and planning upper endoscopy.  GI recommended IV Protonix twice daily.  Further recommendations  forthcoming.  HOSPITAL COURSE BY PROBLEM   Upper GI Bleed  - pt has had about 2 weeks of melanotic stools on apixaban - his Hg has continued to trend down, now 7.6 down from 11 one month ago - he stool was guaiac positive  - he was treated with IV pantoprazole BID  - GI consultation requested for inpatient upper endoscopy - GI did EGD on 3/3 with findings of cratered esophageal ulcer with no stigmata of recent bleeding;  - continue protonix 40 mg BID per GI  - sucralfate 1 g po QID x 1 month per GI - can restart apixaban 3/3 per GI  - FU with GI in 2-3 weeks  - recheck CBC in 1-2 weeks  - Hg stable and remains>8   H/O Gastritis and gastroduodenitis - IV pantoprazole BID as ordered - DC on oral protonix BID  - appreciate GI consult and recommendations    Acute/chronic blood loss anemia - transfused 1 unit PRBC, Hg up to 8  - endoscopy as above   Paroxysmal Atrial Fibrillation  - he is not on rate controlling medication - initially held apixaban - after endoscopy, restarted apixaban 3/3 - Hg remains >8  - DC  home with [facility]    CAD s/p MI and s/p stent x 2 - resume home cardiac medications - no current CP symptoms - monitor telemetry - troponin was 22    Essential hypertension  - resumed home BP medication and follow BP - suboptimally controlled; - doesn't seem to tolerate beta blockers, dc metoprolol due to AVB  CKD stage IV - stable creatinine - resume home gout medication - renally dose medication as able    Acute Bronchitis  - added mucinex, delsym  - added Duoneb Q6 hours - completed doxycycline 100 mg BID x 3 days  - completed mucinex to 1200 mg BID - added incentive spirometry and added flutter valve while in hospital    Generalized Weakness - multifactorial in setting of anemia, bronchitis - transfused 1 unit PRBC - PT assessment requested - home health ordered    H/O prostate Cancer - resume home medication    Postop s/p 5th left floating  osteotomy (01/13/23) - pt is 1 month postop  - follow up with Dr. Posey Pronto his surgeon/podiatrist - weightbearing as tolerated in cam shoe   Hypokalemia  - oral replacement given and repleted   Discharge Diagnoses:  Principal Problem:   Upper GI bleed Active Problems:   Hyperlipidemia   Essential hypertension   Coronary artery disease involving native coronary artery of native heart without angina pectoris   Rheumatoid arthritis ([place])   Chronic kidney disease, stage 4 (severe) (HCC)   Type 2 diabetes mellitus with diabetic chronic kidney disease ([place])   History of prostate cancer   History of gout   DDD (degenerative disc disease), lumbar   Cervical spinal stenosis   Paroxysmal atrial fibrillation (HCC)   Tachycardia-bradycardia syndrome (HCC)   Chronic heart failure with preserved ejection fraction (HCC)   AVM (arteriovenous malformation) of colon   Gastritis and gastroduodenitis   Acquired thrombophilia (HCC)   Generalized weakness   Acute bronchitis   Ulcer of esophagus without bleeding   Discharge Instructions:  Allergies as of 02/17/2023   No Known Allergies      Medication List     STOP taking these medications    omeprazole 20 MG capsule Commonly known as: PRILOSEC       TAKE these medications    Accu-Chek Guide test strip Generic drug: glucose blood Use as instructed   Accu-Chek Guide w/Device Kit 1 Units by Does not apply route 2 (two) times daily.   accu-chek soft touch lancets Use as instructed   allopurinol 100 MG tablet Commonly known as: ZYLOPRIM TAKE 1/2 TABLET EVERY OTHER DAY What changed:  how much to take how to take this when to take this additional instructions   amLODipine 10 MG tablet Commonly known as: NORVASC Take 1 tablet (10 mg total) by mouth daily.   apixaban 5 MG Tabs tablet Commonly known as: ELIQUIS Take 1 tablet (5 mg total) by mouth 2 (two) times daily.   Colchicine 0.6 MG Caps Take 0.6 mg by mouth daily as  needed (Gout).   DropSafe Alcohol Prep 70 % Pads USE AS NEEDED TO CLEAN SKIN PRIOR TO FINGERPRICK AND INJECTION   Dupixent 300 MG/2ML Sopn Generic drug: Dupilumab Inject 1 application into the skin every 14 (fourteen) days.   folic acid A999333 MCG tablet Commonly known as: FOLVITE Take 400 mcg by mouth daily.   hydrALAZINE 50 MG tablet Commonly known as: APRESOLINE Take 100 mg by mouth 2 (two) times daily.   leflunomide 10 MG tablet Commonly known as: ARAVA Take 1 tablet (10 mg total) by mouth daily.   nitroGLYCERIN 0.4 MG SL tablet Commonly known as: NITROSTAT Place 0.4 mg under the tongue every 5 (five) minutes as needed for chest pain.   pantoprazole 40 MG tablet Commonly known as: PROTONIX Take 1 tablet (40 mg total) by mouth 2 (two) times daily.  rosuvastatin 40 MG tablet Commonly known as: CRESTOR TAKE 1 TABLET EVERY DAY. STOP ATORVASTATIN What changed: See the new instructions.   sucralfate 1 GM/10ML suspension Commonly known as: CARAFATE Take 10 mLs (1 g total) by mouth 4 (four) times daily -  with meals and at bedtime.   triamcinolone cream 0.1 % Commonly known as: KENALOG Apply 1 Application topically daily.        Follow-up Information     [facility].   Specialty: Home Health Services Why: Will contact you to schedule home health visits. Contact information: [address] [place] [address] [phone]         [facility]. Schedule an appointment as soon as possible for a visit in 3 week(s).   Why: Hospital Follow Up Contact information: [address] [place] [phone]               No Known Allergies Allergies as of 02/17/2023   No Known Allergies      Medication List     STOP taking these medications    omeprazole 20 MG capsule Commonly known as: PRILOSEC       TAKE these medications    Accu-Chek Guide test strip Generic  drug: glucose blood Use as instructed   Accu-Chek Guide w/Device Kit 1 Units by Does not apply route 2 (two) times daily.   accu-chek soft touch lancets Use as instructed   allopurinol 100 MG tablet Commonly known as: ZYLOPRIM TAKE 1/2 TABLET EVERY OTHER DAY What changed:  how much to take how to take this when to take this additional instructions   amLODipine 10 MG tablet Commonly known as: NORVASC Take 1 tablet (10 mg total) by mouth daily.   apixaban 5 MG Tabs tablet Commonly known as: ELIQUIS Take 1 tablet (5 mg total) by mouth 2 (two) times daily.   Colchicine 0.6 MG Caps Take 0.6 mg by mouth daily as needed (Gout).   DropSafe Alcohol Prep 70 % Pads USE AS NEEDED TO CLEAN SKIN PRIOR TO FINGERPRICK AND INJECTION   Dupixent 300 MG/2ML Sopn Generic drug: Dupilumab Inject 1 application into the skin every 14 (fourteen) days.   folic acid A999333 MCG tablet Commonly known as: FOLVITE Take 400 mcg by mouth daily.   hydrALAZINE 50 MG tablet Commonly known as: APRESOLINE Take 100 mg by mouth 2 (two) times daily.   leflunomide 10 MG tablet Commonly known as: ARAVA Take 1 tablet (10 mg total) by mouth daily.   nitroGLYCERIN 0.4 MG SL tablet Commonly known as: NITROSTAT Place 0.4 mg under the tongue every 5 (five) minutes as needed for chest pain.   pantoprazole 40 MG tablet Commonly known as: PROTONIX Take 1 tablet (40 mg total) by mouth 2 (two) times daily.   rosuvastatin 40 MG tablet Commonly known as: CRESTOR TAKE 1 TABLET EVERY DAY. STOP ATORVASTATIN What changed: See the new instructions.   sucralfate 1 GM/10ML suspension Commonly known as: CARAFATE Take 10 mLs (1 g total) by mouth 4 (four) times daily -  with meals and at bedtime.   triamcinolone cream 0.1 % Commonly known as: KENALOG Apply 1 Application topically daily.        Procedures/Studies: DG CHEST PORT 1 VIEW  Result Date: 02/15/2023 CLINICAL DATA:  Shortness of breath EXAM: PORTABLE  CHEST 1 VIEW COMPARISON:  Chest x-ray 02/13/2023 FINDINGS: Enlarged cardiopericardial silhouette with tortuous ectatic aorta. Vascular congestion. No pneumothorax. Persistent mild right basilar  atelectasis. More confluence left retrocardiac opacity with small effusion. Overlapping cardiac leads. IMPRESSION: 1.  No significant interval change when adjusting for technique Electronically Signed   By: Jill Side M.D.   On: 02/15/2023 12:00   DG Chest Port 1 View  Result Date: 02/13/2023 CLINICAL DATA:  Shortness of breath EXAM: PORTABLE CHEST 1 VIEW COMPARISON:  08/25/2020 FINDINGS: Upper normal heart size for AP technique. Retrocardiac airspace opacity suspicious for pneumonia or atelectasis. Questionable right retrocardiac medial airspace opacity, cannot exclude multilobar pneumonia. Airway thickening is present, suggesting bronchitis or reactive airways disease. No blunting of the lateral costophrenic angles. IMPRESSION: 1. Retrocardiac airspace opacity suspicious for pneumonia or atelectasis. Questionable right retrocardiac airspace opacity, cannot exclude multilobar pneumonia. 2. Airway thickening is present, suggesting bronchitis or reactive airways disease. 3. Upper normal heart size for AP technique. Electronically Signed   By: Van Clines M.D.   On: 02/13/2023 10:05   DG Foot Complete Left  Result Date: 01/21/2023 Please see detailed radiograph report in office note.    Subjective: Pt reports feeling much better. No further bleeding noted.  Tolerated diet.  No SOB.   Discharge Exam: Vitals:   02/17/23 0528 02/17/23 0743  BP: (!) 159/72   Pulse: 76   Resp: 14   Temp: 97.7 F (36.5 C)   SpO2: 96% 93%   Vitals:   02/16/23 1924 02/16/23 2001 02/17/23 0528 02/17/23 0743  BP:  (!) 147/72 (!) 159/72   Pulse:  76 76   Resp:  16 14   Temp:  (!) 97.4 F (36.3 C) 97.7 F (36.5 C)   TempSrc:  Oral Oral   SpO2: 93% 97% 96% 93%  Weight:   79.5 kg   Height:        General: Pt is  alert, awake, not in acute distress Cardiovascular: RRR, S1/S2 +, no rubs, no gallops Respiratory: CTA bilaterally, no wheezing, no rhonchi Abdominal: Soft, NT, ND, bowel sounds + Extremities: no edema, no cyanosis   The results of significant diagnostics from this hospitalization (including imaging, microbiology, ancillary and laboratory) are listed below for reference.     Microbiology: Recent Results (from the past 240 hour(s))  Resp panel by RT-PCR (RSV, Flu A&B, Covid) Anterior Nasal Swab     Status: None   Collection Time: 02/13/23  6:05 PM   Specimen: Anterior Nasal Swab  Result Value Ref Range Status   SARS Coronavirus 2 by RT PCR NEGATIVE NEGATIVE Final    Comment: (NOTE) SARS-CoV-2 target nucleic acids are NOT DETECTED.  The SARS-CoV-2 RNA is generally detectable in upper respiratory specimens during the acute phase of infection. The lowest concentration of SARS-CoV-2 viral copies this assay can detect is 138 copies/mL. A negative result does not preclude SARS-Cov-2 infection and should not be used as the sole basis for treatment or other patient management decisions. A negative result may occur with  improper specimen collection/handling, submission of specimen other than nasopharyngeal swab, presence of viral mutation(s) within the areas targeted by this assay, and inadequate number of viral copies(<138 copies/mL). A negative result must be combined with clinical observations, patient history, and epidemiological information. The expected result is Negative.  Fact Sheet for Patients:  EntrepreneurPulse.com.au  Fact Sheet for Healthcare Providers:  IncredibleEmployment.be  This test is no t yet approved or cleared by the [place] FDA and  has been authorized for detection and/or diagnosis of SARS-CoV-2 by FDA under an Emergency Use Authorization (EUA). This EUA will remain  in effect (meaning  this test can be used) for the  duration of the COVID-19 declaration under Section 564(b)(1) of the Act, 21 U.S.C.section 360bbb-3(b)(1), unless the authorization is terminated  or revoked sooner.       Influenza A by PCR NEGATIVE NEGATIVE Final   Influenza B by PCR NEGATIVE NEGATIVE Final    Comment: (NOTE) The Xpert Xpress SARS-CoV-2/FLU/RSV plus assay is intended as an aid in the diagnosis of influenza from Nasopharyngeal swab specimens and should not be used as a sole basis for treatment. Nasal washings and aspirates are unacceptable for Xpert Xpress SARS-CoV-2/FLU/RSV testing.  Fact Sheet for Patients: EntrepreneurPulse.com.au  Fact Sheet for Healthcare Providers: IncredibleEmployment.be  This test is not yet approved or cleared by the [place] FDA and has been authorized for detection and/or diagnosis of SARS-CoV-2 by FDA under an Emergency Use Authorization (EUA). This EUA will remain in effect (meaning this test can be used) for the duration of the COVID-19 declaration under Section 564(b)(1) of the Act, 21 U.S.C. section 360bbb-3(b)(1), unless the authorization is terminated or revoked.     Resp Syncytial Virus by PCR NEGATIVE NEGATIVE Final    Comment: (NOTE) Fact Sheet for Patients: EntrepreneurPulse.com.au  Fact Sheet for Healthcare Providers: IncredibleEmployment.be  This test is not yet approved or cleared by the [place] FDA and has been authorized for detection and/or diagnosis of SARS-CoV-2 by FDA under an Emergency Use Authorization (EUA). This EUA will remain in effect (meaning this test can be used) for the duration of the COVID-19 declaration under Section 564(b)(1) of the Act, 21 U.S.C. section 360bbb-3(b)(1), unless the authorization is terminated or revoked.  Performed at [facility], [address] [place],  [address]      Labs: BNP (last 3 results) Recent Labs    02/13/23 0947   BNP [id number]*   Basic Metabolic Panel: Recent Labs  Lab 02/13/23 1030 02/14/23 0430 02/15/23 0450 02/16/23 0503  NA 138 138 139 139  K 3.6 3.2* 3.4* 3.8  CL 109 109 109 112*  CO2 20* 20* 20* 19*  GLUCOSE 122* 115* 126* 109*  BUN 38* 35* 30* 31*  CREATININE 3.16* 3.06* 3.16* 3.45*  CALCIUM 8.6* 7.9* 8.3* 8.3*  MG  --  2.1  --   --   PHOS  --  4.2 3.5 3.7   Liver Function Tests: Recent Labs  Lab 02/14/23 0430 02/15/23 0450 02/16/23 0503  ALBUMIN 3.3* 3.3* 3.2*   No results for input(s): "LIPASE", "AMYLASE" in the last 168 hours. No results for input(s): "AMMONIA" in the last 168 hours. CBC: Recent Labs  Lab 02/13/23 0937 02/14/23 0430 02/15/23 0450 02/16/23 0503 02/17/23 0435  WBC 7.4 6.2 6.5 7.3 8.1  HGB 7.6* 8.0* 8.2* 8.3* 8.5*  HCT 24.4* 26.0* 25.6* 27.0* 27.7*  MCV 92.4 93.5 93.8 94.4 94.2  PLT 153 127* 133* 141* 137*   Cardiac Enzymes: No results for input(s): "CKTOTAL", "CKMB", "CKMBINDEX", "TROPONINI" in the last 168 hours. BNP: Invalid input(s): "POCBNP" CBG: No results for input(s): "GLUCAP" in the last 168 hours. D-Dimer No results for input(s): "DDIMER" in the last 72 hours. Hgb A1c No results for input(s): "HGBA1C" in the last 72 hours. Lipid Profile No results for input(s): "CHOL", "HDL", "LDLCALC", "TRIG", "CHOLHDL", "LDLDIRECT" in the last 72 hours. Thyroid function studies No results for input(s): "TSH", "T4TOTAL", "T3FREE", "THYROIDAB" in the last 72 hours.  Invalid input(s): "FREET3" Anemia work up No results for input(s): "VITAMINB12", "FOLATE", "FERRITIN", "TIBC", "IRON", "RETICCTPCT" in the last 72 hours. Urinalysis  Component Value Date/Time   COLORURINE YELLOW 11/02/2020 1155   APPEARANCEUR CLEAR 11/02/2020 1155   LABSPEC 1.017 11/02/2020 1155   PHURINE 6.0 11/02/2020 1155   GLUCOSEU NEGATIVE 11/02/2020 1155   HGBUR NEGATIVE 11/02/2020 [place] 11/02/2020 1155   [place] 11/02/2020 1155    PROTEINUR 100 (A) 11/02/2020 1155   UROBILINOGEN 0.2 04/02/2013 1227   NITRITE NEGATIVE 11/02/2020 1155   LEUKOCYTESUR NEGATIVE 11/02/2020 1155   Sepsis Labs Recent Labs  Lab 02/14/23 0430 02/15/23 0450 02/16/23 0503 02/17/23 0435  WBC 6.2 6.5 7.3 8.1   Microbiology Recent Results (from the past 240 hour(s))  Resp panel by RT-PCR (RSV, Flu A&B, Covid) Anterior Nasal Swab     Status: None   Collection Time: 02/13/23  6:05 PM   Specimen: Anterior Nasal Swab  Result Value Ref Range Status   SARS Coronavirus 2 by RT PCR NEGATIVE NEGATIVE Final    Comment: (NOTE) SARS-CoV-2 target nucleic acids are NOT DETECTED.  The SARS-CoV-2 RNA is generally detectable in upper respiratory specimens during the acute phase of infection. The lowest concentration of SARS-CoV-2 viral copies this assay can detect is 138 copies/mL. A negative result does not preclude SARS-Cov-2 infection and should not be used as the sole basis for treatment or other patient management decisions. A negative result may occur with  improper specimen collection/handling, submission of specimen other than nasopharyngeal swab, presence of viral mutation(s) within the areas targeted by this assay, and inadequate number of viral copies(<138 copies/mL). A negative result must be combined with clinical observations, patient history, and epidemiological information. The expected result is Negative.  Fact Sheet for Patients:  EntrepreneurPulse.com.au  Fact Sheet for Healthcare Providers:  IncredibleEmployment.be  This test is no t yet approved or cleared by the [place] FDA and  has been authorized for detection and/or diagnosis of SARS-CoV-2 by FDA under an Emergency Use Authorization (EUA). This EUA will remain  in effect (meaning this test can be used) for the duration of the COVID-19 declaration under Section 564(b)(1) of the Act, 21 U.S.C.section 360bbb-3(b)(1), unless the  authorization is terminated  or revoked sooner.       Influenza A by PCR NEGATIVE NEGATIVE Final   Influenza B by PCR NEGATIVE NEGATIVE Final    Comment: (NOTE) The Xpert Xpress SARS-CoV-2/FLU/RSV plus assay is intended as an aid in the diagnosis of influenza from Nasopharyngeal swab specimens and should not be used as a sole basis for treatment. Nasal washings and aspirates are unacceptable for Xpert Xpress SARS-CoV-2/FLU/RSV testing.  Fact Sheet for Patients: EntrepreneurPulse.com.au  Fact Sheet for Healthcare Providers: IncredibleEmployment.be  This test is not yet approved or cleared by the [place] FDA and has been authorized for detection and/or diagnosis of SARS-CoV-2 by FDA under an Emergency Use Authorization (EUA). This EUA will remain in effect (meaning this test can be used) for the duration of the COVID-19 declaration under Section 564(b)(1) of the Act, 21 U.S.C. section 360bbb-3(b)(1), unless the authorization is terminated or revoked.     Resp Syncytial Virus by PCR NEGATIVE NEGATIVE Final    Comment: (NOTE) Fact Sheet for Patients: EntrepreneurPulse.com.au  Fact Sheet for Healthcare Providers: IncredibleEmployment.be  This test is not yet approved or cleared by the [place] FDA and has been authorized for detection and/or diagnosis of SARS-CoV-2 by FDA under an Emergency Use Authorization (EUA). This EUA will remain in effect (meaning this test can be used) for the duration of the COVID-19 declaration under Section 564(b)(1)  of the Act, 21 U.S.C. section 360bbb-3(b)(1), unless the authorization is terminated or revoked.  Performed at [facility], [address] [place], [place] [address]     Time coordinating discharge: 37 mins  SIGNED:  Irwin Brakeman, MD  [facility] 02/17/2023, 10:44 AM How to contact the [facility] Attending or Consulting provider [place] or  covering provider during after hours [place], for this patient?  Check the care team in [facility] and look for a) attending/consulting TR[facility] provider listed and b) the [facility] team listed Log into [email or web address] and use [place]'s universal password to access. If you do not have the password, please contact the hospital operator. Locate the [facility] provider you are looking for under [facility] and page to a number that you can be directly reached. If you still have difficulty reaching the provider, please page the [facility] (Director on Call) for the Hospitalists listed on amion for assistance.

## 2023-02-17 NOTE — Care Management Important Message (Signed)
Important Message  Patient Details  Name: Jacob Farmer MRN: [id number] Date of Birth: 1946-02-23   Medicare Important Message Given:  Yes     [name] 02/17/2023, 11:32 AM

## 2023-02-17 NOTE — TOC Transition Note (Signed)
Transition of Care [facility]) - CM/SW Discharge Note   Patient Details  Name: Jacob Farmer MRN: [id number] Date of Birth: 1946/03/14  [facility] CM/SW Contact:  Salome Arnt, LCSW Phone Number: 02/17/2023, 10:27 AM   Clinical Narrative:  PT evaluated pt and recommend home health. Discussed with pt who defers to wife. Pt's wife states pt recently had home health but she is not sure who it was with. She has no preference on agency. Referred and accepted by Marjory Lies with [facility] for HHPT/RN. MD notified for orders. Marjory Lies aware of d/c today.      Final next level of care: [place] Barriers to Discharge: Barriers Resolved   Patient Goals and CMS Choice   Choice offered to / list presented to : Spouse  Discharge Placement                      Patient and family notified of of transfer: 02/17/23  Discharge Plan and Services Additional resources added to the After Visit Summary for                            [facility] Arranged: RN, PT [facility] Agency: [place] Date [place]: 02/17/23 Time [place]: 1027 Representative spoke with at [place]: [place] ([place]) Interventions SDOH Screenings   Food Insecurity: No Food Insecurity (02/13/2023)  Housing: Low Risk  (02/13/2023)  Transportation Needs: No Transportation Needs (02/13/2023)  Utilities: Not At Risk (02/13/2023)  Alcohol Screen: Low Risk  (09/14/2021)  Depression (PHQ2-9): Low Risk  (01/27/2023)  Financial Resource Strain: Low Risk  (09/14/2021)  Physical Activity: Sufficiently Active (09/14/2021)  Social Connections: Moderately Integrated (09/14/2021)  Stress: No Stress Concern Present (09/14/2021)  Tobacco Use: Medium Risk (02/13/2023)     Readmission Risk Interventions     No data to display

## 2023-02-18 ENCOUNTER — Telehealth: Payer: Self-pay

## 2023-02-18 ENCOUNTER — Telehealth: Payer: Self-pay | Admitting: Internal Medicine

## 2023-02-18 NOTE — Telephone Encounter (Signed)
Left message for Jacob Farmer

## 2023-02-18 NOTE — Telephone Encounter (Signed)
Spoke to brian , patient has agreed to have someone come see him on 02/20/23

## 2023-02-18 NOTE — Telephone Encounter (Signed)
Jacob Farmer [facility], [phone]   Needs to delay start date until 02/20/23?

## 2023-02-18 NOTE — Transitions of Care (Post Inpatient/ED Visit) (Signed)
   02/18/2023  Name: Jacob Farmer MRN: [id number] DOB: September 23, 1946  Today's TOC FU Call Status: Today's TOC FU Call Status:: Successful TOC FU Call Competed TOC FU Call Complete Date: 02/18/23  Transition Care Management Follow-up Telephone Call Date of Discharge: 02/17/23 Dis[facility] Type of Discharge: Inpatient Admission Primary Inpatient Discharge Diagnosis:: Upper GI Bleed How have you been since you were released from the hospital?: Better Any questions or concerns?: No (Discussed patient status with wife, [name])  Items Reviewed: Did you receive and understand the discharge instructions provided?: Yes Medications obtained and verified?: Yes (Medications Reviewed) Any new allergies since your discharge?: No Dietary orders reviewed?: Yes Type of Diet Ordered:: Heart Healthy Do you have support at home?: Yes People in Home: spouse Name of Support/Comfort Primary Source: Wife, [facility] and Equipment/Supplies: [place] Ordered?: Yes Name of Novice:: [place] set up a time to come to your home?: Yes [place] Visit Date: 02/20/23 Any new equipment or medical supplies ordered?: No  Functional Questionnaire: Do you need assistance with bathing/showering or dressing?: Yes Do you need assistance with meal preparation?: Yes Do you have difficulty maintaining continence: No Do you need assistance with getting out of bed/getting out of a chair/moving?: Yes Do you have difficulty managing or taking your medications?: Yes  Folllow up appointments reviewed: PCP Follow-up appointment confirmed?: Yes Date of PCP follow-up appointment?: 02/26/23 Follow-up Provider: Juanda Chance, [facility] Follow-up appointment confirmed?: Yes Date of Specialist follow-up appointment?: 03/11/23 Follow-Up Specialty Provider:: Dr. Diona Fanti Do you need transportation to your follow-up  appointment?: No Do you understand care options if your condition(s) worsen?: Yes-patient verbalized understanding  SDOH Interventions Today    Flowsheet Row Most Recent Value  SDOH Interventions   Food Insecurity Interventions Intervention Not Indicated  Housing Interventions Intervention Not Indicated  Transportation Interventions Intervention Not Indicated  Financial Strain Interventions Intervention Not Indicated      Johnney Killian, RN, BSN, CCM Care Management Coordinator [facility] Phone: [phone]: [phone]

## 2023-02-20 DIAGNOSIS — N184 Chronic kidney disease, stage 4 (severe): Secondary | ICD-10-CM | POA: Diagnosis not present

## 2023-02-20 DIAGNOSIS — D500 Iron deficiency anemia secondary to blood loss (chronic): Secondary | ICD-10-CM | POA: Diagnosis not present

## 2023-02-20 DIAGNOSIS — I2510 Atherosclerotic heart disease of native coronary artery without angina pectoris: Secondary | ICD-10-CM | POA: Diagnosis not present

## 2023-02-20 DIAGNOSIS — K2990 Gastroduodenitis, unspecified, without bleeding: Secondary | ICD-10-CM | POA: Diagnosis not present

## 2023-02-20 DIAGNOSIS — E1122 Type 2 diabetes mellitus with diabetic chronic kidney disease: Secondary | ICD-10-CM | POA: Diagnosis not present

## 2023-02-20 DIAGNOSIS — I480 Paroxysmal atrial fibrillation: Secondary | ICD-10-CM | POA: Diagnosis not present

## 2023-02-20 DIAGNOSIS — I130 Hypertensive heart and chronic kidney disease with heart failure and stage 1 through stage 4 chronic kidney disease, or unspecified chronic kidney disease: Secondary | ICD-10-CM | POA: Diagnosis not present

## 2023-02-20 DIAGNOSIS — I5032 Chronic diastolic (congestive) heart failure: Secondary | ICD-10-CM | POA: Diagnosis not present

## 2023-02-20 DIAGNOSIS — K2970 Gastritis, unspecified, without bleeding: Secondary | ICD-10-CM | POA: Diagnosis not present

## 2023-02-24 ENCOUNTER — Telehealth: Payer: Self-pay | Admitting: Internal Medicine

## 2023-02-24 NOTE — Telephone Encounter (Signed)
Cheron Every [facility], [phone]   Called wanting to see pt for skilled nursing?  Once a week every other week for 6 weeks & 1 PRN

## 2023-02-24 NOTE — Telephone Encounter (Signed)
[place] answer the phone and stated this was the wrong number

## 2023-02-25 ENCOUNTER — Telehealth: Payer: Self-pay | Admitting: Internal Medicine

## 2023-02-25 NOTE — Telephone Encounter (Signed)
[place] correct # [phone]  Called back today in regards to message sent yesterday

## 2023-02-25 NOTE — Telephone Encounter (Signed)
[facility] advised 

## 2023-02-26 ENCOUNTER — Encounter: Payer: Self-pay | Admitting: Internal Medicine

## 2023-02-26 ENCOUNTER — Ambulatory Visit (INDEPENDENT_AMBULATORY_CARE_PROVIDER_SITE_OTHER): Payer: Medicare HMO | Admitting: Internal Medicine

## 2023-02-26 ENCOUNTER — Inpatient Hospital Stay: Payer: Medicare HMO | Admitting: Family Medicine

## 2023-02-26 VITALS — BP 138/56 | HR 79 | Ht 70.0 in | Wt 174.6 lb

## 2023-02-26 DIAGNOSIS — N184 Chronic kidney disease, stage 4 (severe): Secondary | ICD-10-CM

## 2023-02-26 DIAGNOSIS — K2210 Ulcer of esophagus without bleeding: Secondary | ICD-10-CM | POA: Diagnosis not present

## 2023-02-26 DIAGNOSIS — Z09 Encounter for follow-up examination after completed treatment for conditions other than malignant neoplasm: Secondary | ICD-10-CM | POA: Insufficient documentation

## 2023-02-26 DIAGNOSIS — K922 Gastrointestinal hemorrhage, unspecified: Secondary | ICD-10-CM | POA: Diagnosis not present

## 2023-02-26 DIAGNOSIS — D62 Acute posthemorrhagic anemia: Secondary | ICD-10-CM

## 2023-02-26 NOTE — Assessment & Plan Note (Signed)
Due to UGIB Had 1 unit of PRBC transfusion recently Check CBC Continue protonix for gastritis

## 2023-02-26 NOTE — Patient Instructions (Signed)
Please continue taking Pantoprazole twice daily.  Please perform leg elevation for leg swelling. Okay to wear compression socks. Please avoid prolonged standing or sitting with the legs on the floor.

## 2023-02-26 NOTE — Progress Notes (Signed)
Established Patient Office Visit  Subjective:  Patient ID: Jacob Farmer, male    DOB: 10-Aug-1946  Age: 77 y.o. MRN: [id number]  CC:  Chief Complaint  Patient presents with   Hospitalization [facility] follow up from 02/13/2023-02/17/2023 for shortness of breath and bleeding ulcer    HPI Jacob Farmer is a 77 y.o. male with past medical history of CAD status post stent placement, HTN, HLD, RA, CKD stage IV, prostate ca. s/p radiotherapy, gout, cervical spinal stenosis and GERD who presents for f/u of recent hospitalization for acute GU bleeding and anemia.  The patient reports that for 2 days before hospitalization, he has had increasing shortness of breath with short distance ambulation and noticed some increasing edema in the legs.  He had been having melanotic stools for the past several days as well.  He was taking his apixaban regularly.  He denies chest pain symptoms.  He denies palpitations.  GI was consulted, had EGD with findings of cratered esophageal ulcer with no stigmata of recent bleeding.  She was placed on Protonix drip, later switched to oral Protonix.  He was placed again on Eliquis, His Hb remained stable around 8.  He did receive a unit of PRBC upon admission.  He was also given doxycycline for acute bronchitis.  He currently denies dyspnea or wheezing.  He has leg swelling, but has been getting better slowly.  His leg swelling usually improves in the morning, but progresses during the day.  Past Medical History:  Diagnosis Date   CAD (coronary artery disease)    Acute myocardial infarction treated with TPA in 1990; 1999-stents to circumflex and RCA; residual total occlusion of the left anterior descending; normal ejection fraction; stress nuclear in 2001-distal anteroseptal ischemia; normal LV function   CKD (chronic kidney disease) stage 4, GFR 15-29 ml/min (HCC)    First degree heart block    History of gastric ulcer    History of kidney stones    History  of MI (myocardial infarction)    1990-  treated w/ TPA   Hyperlipidemia    Hypertension    Jaundice    OA (osteoarthritis)    Prediabetes    Prostate cancer ([place])    Stage T1c , Gleason 3+4,  PSA 4.7,  vol 24cc--  scheduled for radiative seed implants   Psoriatic arthritis ([place])    RA (rheumatoid arthritis) ([place])    Dr. Toni Amend   Sinus bradycardia    Wears dentures     Past Surgical History:  Procedure Laterality Date   BIOPSY  08/27/2019   Procedure: BIOPSY;  Surgeon: Rogene Houston, MD;  [facility];  Service: Endoscopy;;  gastric   BIOPSY  04/03/2022   Procedure: BIOPSY;  Surgeon: Montez Morita, Quillian Quince, MD;  [facility];  Service: Gastroenterology;;   callus removal Left 09/13/2022   left foot   CARDIOVASCULAR STRESS TEST  2001  per Dr Lattie Haw clinic note   distal anteroseptal ischemia,  normal LVF   COLONOSCOPY  last one 2006 (approx)   COLONOSCOPY WITH PROPOFOL N/A 04/03/2022   Procedure: COLONOSCOPY WITH PROPOFOL;  Surgeon: Harvel Quale, MD;  [facility];  Service: Gastroenterology;  Laterality: N/A;  [place] WITH STENT PLACEMENT  1999   DES x2  to CFX and RCA/  residual total occlusion LAD,  normal LVEF   ESOPHAGOGASTRODUODENOSCOPY (EGD) WITH PROPOFOL N/A 08/27/2019   Procedure: ESOPHAGOGASTRODUODENOSCOPY (EGD) WITH PROPOFOL;  Surgeon: Rogene Houston, MD;  [facility];  Service: Endoscopy;  Laterality: N/A;  10:10   ESOPHAGOGASTRODUODENOSCOPY (EGD) WITH PROPOFOL N/A 04/03/2022   Procedure: ESOPHAGOGASTRODUODENOSCOPY (EGD) WITH PROPOFOL;  Surgeon: Harvel Quale, MD;  [facility];  Service: Gastroenterology;  Laterality: N/A;   EYE SURGERY Bilateral    cataract   HOT HEMOSTASIS  04/03/2022   Procedure: HOT HEMOSTASIS (ARGON PLASMA COAGULATION/BICAP);  Surgeon: Montez Morita, Quillian Quince, MD;  [facility];  Service: Gastroenterology;;   POLYPECTOMY   04/03/2022   Procedure: POLYPECTOMY;  Surgeon: Harvel Quale, MD;  [facility];  Service: Gastroenterology;;   POSTERIOR CERVICAL FUSION/FORAMINOTOMY N/A 05/26/2017   Procedure: RIGHT C4-5 FORAMINOTOMY WITH EXCISION OF HERNIATED [place];  Surgeon: Jessy Oto, MD;  Location: [place];  Service: Orthopedics;  Laterality: N/A;   RADIOACTIVE SEED IMPLANT N/A 01/11/2016   Procedure: RADIOACTIVE SEED IMPLANT/BRACHYTHERAPY IMPLANT;  Surgeon: Franchot Gallo, MD;  [facility];  Service: Urology;  Laterality: N/A;   15  seeds implanted no seeds founds in bladder    Family History  Problem Relation Age of Onset   Heart attack Mother    Coronary artery disease Father    Ulcerative colitis Father    Ulcers Father    Breast cancer Sister    COPD Brother    Pancreatic cancer Brother    Healthy Sister    Diabetes Brother    Cirrhosis Son    Seizures Son     Social History   Socioeconomic History   Marital status: Married    Spouse name: Not on file   Number of children: 2   Years of education: Not on file   Highest education level: Not on file  Occupational History   Occupation: Retired Clinical biochemist  Tobacco Use   Smoking status: Former    Packs/day: 1.00    Years: 30.00    Total pack years: 30.00    Types: Cigarettes    Quit date: 05/21/1989    Years since quitting: 33.7    Passive exposure: Never   Smokeless tobacco: Never  Vaping Use   Vaping Use: Never used  Substance and Sexual Activity   Alcohol use: Yes    Comment: 1 beer and 1 mix drink per day   Drug use: No   Sexual activity: Not on file  Other Topics Concern   Not on file  Social History Narrative   Married for 24 years.Retired Clinical biochemist.   Social Determinants of Health   Financial Resource Strain: Low Risk  (02/18/2023)   Overall Financial Resource Strain (CARDIA)    Difficulty of Paying Living Expenses: Not very hard  Food Insecurity: No Food Insecurity  (02/18/2023)   Hunger Vital Sign    Worried About Running Out of Food in the Last Year: Never true    Ran Out of Food in the Last Year: Never true  Transportation Needs: No Transportation Needs (02/18/2023)   PRAPARE - Hydrologist (Medical): No    Lack of Transportation (Non-Medical): No  Physical Activity: Sufficiently Active (09/14/2021)   Exercise Vital Sign    Days of Exercise per Week: 7 days    Minutes of Exercise per Session: 60 min  Stress: No Stress Concern Present (09/14/2021)   [place]    Feeling of Stress : Not at all  Social Connections: Moderately Integrated (09/14/2021)   Social Connection and  Isolation Panel [NHANES]    Frequency of Communication with Friends and Family: More than three times a week    Frequency of Social Gatherings with Friends and Family: More than three times a week    Attends Religious Services: More than 4 times per year    Active Member of [employer] or Organizations: No    Attends Archivist Meetings: Never    Marital Status: Married  Human resources officer Violence: Not At Risk (02/13/2023)   Humiliation, Afraid, Rape, and Kick questionnaire    Fear of Current or Ex-Partner: No    Emotionally Abused: No    Physically Abused: No    Sexually Abused: No    Outpatient Medications Prior to Visit  Medication Sig Dispense Refill   Alcohol Swabs (DROPSAFE ALCOHOL PREP) 70 % PADS USE AS NEEDED TO CLEAN SKIN PRIOR TO FINGERPRICK AND INJECTION 100 each 5   allopurinol (ZYLOPRIM) 100 MG tablet TAKE 1/2 TABLET EVERY OTHER DAY (Patient taking differently: Take 50 mg by mouth every other day.) 45 tablet 0   amLODipine (NORVASC) 10 MG tablet Take 1 tablet (10 mg total) by mouth daily. 90 tablet 3   apixaban (ELIQUIS) 5 MG TABS tablet Take 1 tablet (5 mg total) by mouth 2 (two) times daily. 180 tablet 3   Blood Glucose Monitoring Suppl (ACCU-CHEK GUIDE) w/Device KIT 1  Units by Does not apply route 2 (two) times daily. 1 kit 3   Colchicine 0.6 MG CAPS Take 0.6 mg by mouth daily as needed (Gout).     Dupilumab (DUPIXENT) 300 MG/2ML SOPN Inject 1 application into the skin every 14 (fourteen) days. [id number] mL 4   folic acid (FOLVITE) A999333 MCG tablet Take 400 mcg by mouth daily.     glucose blood (ACCU-CHEK GUIDE) test strip Use as instructed 100 each 12   hydrALAZINE (APRESOLINE) 50 MG tablet Take 100 mg by mouth 2 (two) times daily.     Lancets (ACCU-CHEK SOFT TOUCH) lancets Use as instructed 100 each 12   leflunomide (ARAVA) 10 MG tablet Take 1 tablet (10 mg total) by mouth daily. 90 tablet 0   nitroGLYCERIN (NITROSTAT) 0.4 MG SL tablet Place 0.4 mg under the tongue every 5 (five) minutes as needed for chest pain.      pantoprazole (PROTONIX) 40 MG tablet Take 1 tablet (40 mg total) by mouth 2 (two) times daily. 60 tablet 2   rosuvastatin (CRESTOR) 40 MG tablet TAKE 1 TABLET EVERY DAY. STOP ATORVASTATIN (Patient taking differently: Take 40 mg by mouth daily.) 90 tablet 3   sucralfate (CARAFATE) 1 GM/10ML suspension Take 10 mLs (1 g total) by mouth 4 (four) times daily -  with meals and at bedtime. 420 mL 0   triamcinolone cream (KENALOG) 0.1 % Apply 1 Application topically daily.     No facility-administered medications prior to visit.    No Known Allergies  ROS Review of Systems  Constitutional:  Negative for chills and fever.  HENT:  Negative for congestion and sore throat.   Eyes:  Negative for pain and discharge.  Respiratory:  Negative for cough and shortness of breath.   Cardiovascular:  Positive for leg swelling. Negative for chest pain and palpitations.  Gastrointestinal:  Positive for constipation. Negative for diarrhea, nausea and vomiting.  Endocrine: Negative for polydipsia and polyuria.  Genitourinary:  Negative for dysuria and hematuria.  Musculoskeletal:  Positive for arthralgias and back pain. Negative for neck pain and neck stiffness.   Skin:  Positive for rash.  Neurological:  Negative for dizziness, weakness, numbness and headaches.  Psychiatric/Behavioral:  Negative for agitation and behavioral problems.       Objective:    Physical Exam Vitals reviewed.  Constitutional:      General: He is not in acute distress.    Appearance: He is not diaphoretic.  HENT:     Head: Normocephalic and atraumatic.     Nose: Nose normal.     Mouth/Throat:     Mouth: Mucous membranes are moist.  Eyes:     General: No scleral icterus.    Extraocular Movements: Extraocular movements intact.  Cardiovascular:     Rate and Rhythm: Normal rate and regular rhythm.     Pulses: Normal pulses.     Heart sounds: Normal heart sounds. No murmur heard. Pulmonary:     Breath sounds: Normal breath sounds. No wheezing or rales.  Abdominal:     Palpations: Abdomen is soft.     Tenderness: There is no abdominal tenderness.  Musculoskeletal:     Cervical back: Neck supple. No tenderness.     Right lower leg: No edema.     Left lower leg: No edema.  Feet:     Right foot:     Toenail Condition: Right toenails are abnormally thick. Fungal disease present.    Left foot:     Toenail Condition: Left toenails are abnormally thick. Fungal disease present. Skin:    General: Skin is warm.     Findings: Rash (Diffuse eczematous and dry, scaly patches over LE) present.  Neurological:     General: No focal deficit present.     Mental Status: He is alert and oriented to person, place, and time.     Cranial Nerves: No cranial nerve deficit.     Sensory: No sensory deficit.     Motor: No weakness.  Psychiatric:        Mood and Affect: Mood normal.        Behavior: Behavior normal.     BP (!) 138/56 (BP Location: Left Arm, Patient Position: Sitting, Cuff Size: Normal)   Pulse 79   Ht '5\' 10"'$  (1.778 m)   Wt 174 lb 9.6 oz (79.2 kg)   SpO2 98%   BMI 25.05 kg/m  Wt Readings from Last 3 Encounters:  02/26/23 174 lb 9.6 oz (79.2 kg)  02/17/23  175 lb 4.8 oz (79.5 kg)  02/06/23 185 lb (83.9 kg)    Lab Results  Component Value Date   TSH 3.636 02/13/2023   Lab Results  Component Value Date   WBC 8.1 02/17/2023   HGB 8.5 (L) 02/17/2023   HCT 27.7 (L) 02/17/2023   MCV 94.2 02/17/2023   PLT 137 (L) 02/17/2023   Lab Results  Component Value Date   NA 139 02/16/2023   K 3.8 02/16/2023   CO2 19 (L) 02/16/2023   GLUCOSE 109 (H) 02/16/2023   BUN 31 (H) 02/16/2023   CREATININE 3.45 (H) 02/16/2023   BILITOT 0.4 01/01/2023   ALKPHOS 254 (H) 11/06/2020   AST 23 01/01/2023   ALT 19 01/01/2023   PROT 6.8 01/01/2023   ALBUMIN 3.2 (L) 02/16/2023   CALCIUM 8.3 (L) 02/16/2023   ANIONGAP 8 02/16/2023   EGFR 17 (L) 01/01/2023   Lab Results  Component Value Date   CHOL 111 01/31/2023   Lab Results  Component Value Date   HDL 53 01/31/2023   Lab Results  Component Value Date   LDLCALC 42 01/31/2023   Lab Results  Component Value Date   TRIG 76 01/31/2023   Lab Results  Component Value Date   CHOLHDL 2.1 01/31/2023   Lab Results  Component Value Date   HGBA1C 5.3 01/31/2023      Assessment & Plan:   Problem List Items Addressed This Visit       Digestive   Ulcer of esophagus without bleeding   Gastrointestinal hemorrhage - Primary    Had upper GI bleeding, leading to acute blood loss anemia EGD showed cratered esophageal ulcer with no bleeding and gastritis On pantoprazole now Melanotic stools has resolved now Followed by GI - repeat EGD after 6 weeks      Relevant Orders   CBC with Differential/Platelet     Genitourinary   Chronic kidney disease, stage 4 (severe) ([place])    Last BMP reviewed, GFR stays around 20, was recently worse due to UGIB Avoid nephrotoxic agents including NSAIDs Followed by nephrology      Relevant Orders   Basic Metabolic Panel (BMET)     Other   Hospital discharge follow-up    Hospital chart reviewed, including discharge summary Had UGIB, resolved now Medications  reconciled and reviewed with the patient in detail      ABLA (acute blood loss anemia)    Due to UGIB Had 1 unit of PRBC transfusion recently Check CBC Continue protonix for gastritis      Relevant Orders   CBC with Differential/Platelet    No orders of the defined types were placed in this encounter.   Follow-up: Return if symptoms worsen or fail to improve.    Lindell Spar, MD

## 2023-02-26 NOTE — Assessment & Plan Note (Signed)
Had upper GI bleeding, leading to acute blood loss anemia EGD showed cratered esophageal ulcer with no bleeding and gastritis On pantoprazole now Melanotic stools has resolved now Followed by GI - repeat EGD after 6 weeks

## 2023-02-26 NOTE — Assessment & Plan Note (Signed)
Hospital chart reviewed, including discharge summary Had UGIB, resolved now Medications reconciled and reviewed with the patient in detail

## 2023-02-26 NOTE — Assessment & Plan Note (Signed)
Last BMP reviewed, GFR stays around 20, was recently worse due to UGIB Avoid nephrotoxic agents including NSAIDs Followed by nephrology

## 2023-02-27 LAB — CBC WITH DIFFERENTIAL/PLATELET
Basophils Absolute: 0 10*3/uL (ref 0.0–0.2)
Basos: 1 %
EOS (ABSOLUTE): 0.2 10*3/uL (ref 0.0–0.4)
Eos: 4 %
Hematocrit: 32.6 % — ABNORMAL LOW (ref 37.5–51.0)
Hemoglobin: 10.1 g/dL — ABNORMAL LOW (ref 13.0–17.7)
Immature Grans (Abs): 0 10*3/uL (ref 0.0–0.1)
Immature Granulocytes: 0 %
Lymphocytes Absolute: 0.8 10*3/uL (ref 0.7–3.1)
Lymphs: 12 %
MCH: 27.4 pg (ref 26.6–33.0)
MCHC: 31 g/dL — ABNORMAL LOW (ref 31.5–35.7)
MCV: 89 fL (ref 79–97)
Monocytes Absolute: 0.7 10*3/uL (ref 0.1–0.9)
Monocytes: 11 %
Neutrophils Absolute: 4.9 10*3/uL (ref 1.4–7.0)
Neutrophils: 72 %
Platelets: 171 10*3/uL (ref 150–450)
RBC: 3.68 x10E6/uL — ABNORMAL LOW (ref 4.14–5.80)
RDW: 13.8 % (ref 11.6–15.4)
WBC: 6.7 10*3/uL (ref 3.4–10.8)

## 2023-02-27 LAB — BASIC METABOLIC PANEL
BUN/Creatinine Ratio: 7 — ABNORMAL LOW (ref 10–24)
BUN: 22 mg/dL (ref 8–27)
CO2: 20 mmol/L (ref 20–29)
Calcium: 8.8 mg/dL (ref 8.6–10.2)
Chloride: 106 mmol/L (ref 96–106)
Creatinine, Ser: 3.31 mg/dL — ABNORMAL HIGH (ref 0.76–1.27)
Glucose: 90 mg/dL (ref 70–99)
Potassium: 3.7 mmol/L (ref 3.5–5.2)
Sodium: 142 mmol/L (ref 134–144)
eGFR: 18 mL/min/{1.73_m2} — ABNORMAL LOW (ref >59)

## 2023-02-28 ENCOUNTER — Encounter (HOSPITAL_COMMUNITY): Payer: Self-pay | Admitting: Gastroenterology

## 2023-03-03 DIAGNOSIS — K2970 Gastritis, unspecified, without bleeding: Secondary | ICD-10-CM | POA: Diagnosis not present

## 2023-03-03 DIAGNOSIS — E1122 Type 2 diabetes mellitus with diabetic chronic kidney disease: Secondary | ICD-10-CM | POA: Diagnosis not present

## 2023-03-03 DIAGNOSIS — I130 Hypertensive heart and chronic kidney disease with heart failure and stage 1 through stage 4 chronic kidney disease, or unspecified chronic kidney disease: Secondary | ICD-10-CM | POA: Diagnosis not present

## 2023-03-03 DIAGNOSIS — K2990 Gastroduodenitis, unspecified, without bleeding: Secondary | ICD-10-CM | POA: Diagnosis not present

## 2023-03-03 DIAGNOSIS — I5032 Chronic diastolic (congestive) heart failure: Secondary | ICD-10-CM | POA: Diagnosis not present

## 2023-03-03 DIAGNOSIS — I480 Paroxysmal atrial fibrillation: Secondary | ICD-10-CM | POA: Diagnosis not present

## 2023-03-03 DIAGNOSIS — D500 Iron deficiency anemia secondary to blood loss (chronic): Secondary | ICD-10-CM | POA: Diagnosis not present

## 2023-03-03 DIAGNOSIS — N184 Chronic kidney disease, stage 4 (severe): Secondary | ICD-10-CM | POA: Diagnosis not present

## 2023-03-03 DIAGNOSIS — I2510 Atherosclerotic heart disease of native coronary artery without angina pectoris: Secondary | ICD-10-CM | POA: Diagnosis not present

## 2023-03-11 ENCOUNTER — Ambulatory Visit: Payer: Medicare HMO | Admitting: Urology

## 2023-03-11 ENCOUNTER — Ambulatory Visit (INDEPENDENT_AMBULATORY_CARE_PROVIDER_SITE_OTHER): Payer: Medicare HMO | Admitting: Gastroenterology

## 2023-03-11 DIAGNOSIS — R972 Elevated prostate specific antigen [PSA]: Secondary | ICD-10-CM

## 2023-03-12 ENCOUNTER — Telehealth: Payer: Self-pay | Admitting: Cardiology

## 2023-03-12 MED ORDER — APIXABAN 5 MG PO TABS: 5.0000 mg | ORAL_TABLET | Freq: Two times a day (BID) | ORAL | 1 refills | Status: DC

## 2023-03-12 NOTE — Telephone Encounter (Signed)
Prescription refill request for Eliquis received. Indication: AF Last office visit: 10/22/22  Zandra Abts MD Scr: 3.31 on 02/26/23  Epic Age: 77 Weight: 82.9kg  Based on above findings Eliquis 5mg  twice daily is the appropriate dose.  Refill approved.

## 2023-03-12 NOTE — Telephone Encounter (Signed)
*  STAT* If patient is at the pharmacy, call can be transferred to refill team.   1. Which medications need to be refilled? (please list name of each medication and dose if known)   apixaban (ELIQUIS) 5 MG TABS tablet    2. Which pharmacy/location (including street and city if local pharmacy) is medication to be sent to? [place], [address]   3. Do they need a 30 day or 90 day supply? 90 day

## 2023-03-18 ENCOUNTER — Ambulatory Visit (INDEPENDENT_AMBULATORY_CARE_PROVIDER_SITE_OTHER): Payer: Medicare HMO | Admitting: Internal Medicine

## 2023-03-18 ENCOUNTER — Encounter: Payer: Self-pay | Admitting: Internal Medicine

## 2023-03-18 VITALS — BP 155/83 | HR 79 | Ht 70.0 in

## 2023-03-18 DIAGNOSIS — I129 Hypertensive chronic kidney disease with stage 1 through stage 4 chronic kidney disease, or unspecified chronic kidney disease: Secondary | ICD-10-CM | POA: Diagnosis not present

## 2023-03-18 DIAGNOSIS — N184 Chronic kidney disease, stage 4 (severe): Secondary | ICD-10-CM | POA: Diagnosis not present

## 2023-03-18 DIAGNOSIS — R809 Proteinuria, unspecified: Secondary | ICD-10-CM | POA: Diagnosis not present

## 2023-03-18 DIAGNOSIS — J209 Acute bronchitis, unspecified: Secondary | ICD-10-CM

## 2023-03-18 DIAGNOSIS — I5032 Chronic diastolic (congestive) heart failure: Secondary | ICD-10-CM

## 2023-03-18 DIAGNOSIS — N170 Acute kidney failure with tubular necrosis: Secondary | ICD-10-CM | POA: Diagnosis not present

## 2023-03-18 DIAGNOSIS — N189 Chronic kidney disease, unspecified: Secondary | ICD-10-CM | POA: Diagnosis not present

## 2023-03-18 DIAGNOSIS — E211 Secondary hyperparathyroidism, not elsewhere classified: Secondary | ICD-10-CM | POA: Diagnosis not present

## 2023-03-18 DIAGNOSIS — D631 Anemia in chronic kidney disease: Secondary | ICD-10-CM | POA: Diagnosis not present

## 2023-03-18 MED ORDER — AZITHROMYCIN 250 MG PO TABS: ORAL_TABLET | ORAL | 0 refills | Status: AC

## 2023-03-18 MED ORDER — PROMETHAZINE-DM 6.25-15 MG/5ML PO SYRP: 5.0000 mL | ORAL_SOLUTION | Freq: Four times a day (QID) | ORAL | 0 refills | Status: DC | PRN

## 2023-03-18 NOTE — Patient Instructions (Signed)
Please take Azithromycin as prescribed.  Please take Promethazine-DM syrup for cough.  Please take Lasix 20 mg once daily for next 2 days and then as needed for leg swelling.

## 2023-03-21 DIAGNOSIS — N189 Chronic kidney disease, unspecified: Secondary | ICD-10-CM | POA: Diagnosis not present

## 2023-03-21 DIAGNOSIS — D631 Anemia in chronic kidney disease: Secondary | ICD-10-CM | POA: Diagnosis not present

## 2023-03-21 DIAGNOSIS — E211 Secondary hyperparathyroidism, not elsewhere classified: Secondary | ICD-10-CM | POA: Diagnosis not present

## 2023-03-21 DIAGNOSIS — D508 Other iron deficiency anemias: Secondary | ICD-10-CM | POA: Diagnosis not present

## 2023-03-21 DIAGNOSIS — N184 Chronic kidney disease, stage 4 (severe): Secondary | ICD-10-CM | POA: Diagnosis not present

## 2023-03-21 DIAGNOSIS — N170 Acute kidney failure with tubular necrosis: Secondary | ICD-10-CM | POA: Diagnosis not present

## 2023-03-21 DIAGNOSIS — I129 Hypertensive chronic kidney disease with stage 1 through stage 4 chronic kidney disease, or unspecified chronic kidney disease: Secondary | ICD-10-CM | POA: Diagnosis not present

## 2023-03-21 DIAGNOSIS — R809 Proteinuria, unspecified: Secondary | ICD-10-CM | POA: Diagnosis not present

## 2023-03-21 DIAGNOSIS — I5032 Chronic diastolic (congestive) heart failure: Secondary | ICD-10-CM | POA: Diagnosis not present

## 2023-03-21 NOTE — Assessment & Plan Note (Signed)
Has persistent cough and mild dyspnea Started empiric azithromycin Promethazine DM syrup as needed for cough Continue incentive spirometry

## 2023-03-21 NOTE — Progress Notes (Signed)
Acute Office Visit  Subjective:    Patient ID: Jacob Farmer, male    DOB: [birth year], 77 y.o.   MRN: [id number]  Chief Complaint  Patient presents with   Cough    Patient states he has a deep cough but nothing is coughing up, he is having shortness of breath when doing activities. Patient ankles are swollen    HPI Patient is in today for complaint of cough and mild dyspnea for the last 2 weeks.  He denies any wheezing currently.  He has tried OTC antitussive without much relief.  Denies any recent fever or chills.  He also ports swelling of the ankles, which are chronic and intermittent.  Denies any recent orthopnea or PND.  His leg swelling usually improves with leg elevation.  Past Medical History:  Diagnosis Date   CAD (coronary artery disease)    Acute myocardial infarction treated with TPA in 1990; 1999-stents to circumflex and RCA; residual total occlusion of the left anterior descending; normal ejection fraction; stress nuclear in 2001-distal anteroseptal ischemia; normal LV function   CKD (chronic kidney disease) stage 4, GFR 15-29 ml/min    First degree heart block    History of gastric ulcer    History of kidney stones    History of MI (myocardial infarction)    1990-  treated w/ TPA   Hyperlipidemia    Hypertension    Jaundice    OA (osteoarthritis)    Prediabetes    Prostate cancer    Stage T1c , Gleason 3+4,  PSA 4.7,  vol 24cc--  scheduled for radiative seed implants   Psoriatic arthritis    RA (rheumatoid arthritis)    Dr. Beatrix Fetters   Sinus bradycardia    Wears dentures     Past Surgical History:  Procedure Laterality Date   BIOPSY  08/27/2019   Procedure: BIOPSY;  Surgeon: Malissa Hippo, MD;  [facility]: [facility];  Service: Endoscopy;;  gastric   BIOPSY  04/03/2022   Procedure: BIOPSY;  Surgeon: Marguerita Merles, Reuel Boom, MD;  Location: [facility];  Service: Gastroenterology;;   callus removal Left 09/13/2022   left foot   CARDIOVASCULAR  STRESS TEST  2001  per Dr Dietrich Pates clinic note   distal anteroseptal ischemia,  normal LVF   COLONOSCOPY  last one 2006 (approx)   COLONOSCOPY WITH PROPOFOL N/A 04/03/2022   Procedure: COLONOSCOPY WITH PROPOFOL;  Surgeon: Dolores Frame, MD;  Location: [facility];  Service: Gastroenterology;  Laterality: N/A;  945   CORONARY ANGIOPLASTY WITH STENT PLACEMENT  1999   DES x2  to CFX and RCA/  residual total occlusion LAD,  normal LVEF   ESOPHAGOGASTRODUODENOSCOPY (EGD) WITH PROPOFOL N/A 08/27/2019   Procedure: ESOPHAGOGASTRODUODENOSCOPY (EGD) WITH PROPOFOL;  Surgeon: Malissa Hippo, MD;  [facility]: [facility];  Service: Endoscopy;  Laterality: N/A;  10:10   ESOPHAGOGASTRODUODENOSCOPY (EGD) WITH PROPOFOL N/A 04/03/2022   Procedure: ESOPHAGOGASTRODUODENOSCOPY (EGD) WITH PROPOFOL;  Surgeon: Dolores Frame, MD;  [facility]: [facility];  Service: Gastroenterology;  Laterality: N/A;   ESOPHAGOGASTRODUODENOSCOPY (EGD) WITH PROPOFOL N/A 02/16/2023   Procedure: ESOPHAGOGASTRODUODENOSCOPY (EGD) WITH PROPOFOL;  Surgeon: Dolores Frame, MD;  [facility]: [facility];  Service: Gastroenterology;  Laterality: N/A;   EYE SURGERY Bilateral    cataract   HOT HEMOSTASIS  04/03/2022   Procedure: HOT HEMOSTASIS (ARGON PLASMA COAGULATION/BICAP);  Surgeon: Marguerita Merles, Reuel Boom, MD;  Location: [facility];  Service: Gastroenterology;;   POLYPECTOMY  04/03/2022  Procedure: POLYPECTOMY;  Surgeon: Dolores Frame, MD;  [facility]: [facility];  Service: Gastroenterology;;   POSTERIOR CERVICAL FUSION/FORAMINOTOMY N/A 05/26/2017   Procedure: RIGHT C4-5 FORAMINOTOMY WITH EXCISION OF HERNIATED DISC;  Surgeon: Kerrin Champagne, MD;  Location: [facility];  Service: Orthopedics;  Laterality: N/A;   RADIOACTIVE SEED IMPLANT N/A 01/11/2016   Procedure: RADIOACTIVE SEED IMPLANT/BRACHYTHERAPY IMPLANT;  Surgeon: Marcine Matar, MD;  Location: [facility];  Service:  Urology;  Laterality: N/A;   71  seeds implanted no seeds founds in bladder    Family History  Problem Relation Age of Onset   Heart attack Mother    Coronary artery disease Father    Ulcerative colitis Father    Ulcers Father    Breast cancer Sister    COPD Brother    Pancreatic cancer Brother    Healthy Sister    Diabetes Brother    Cirrhosis Son    Seizures Son     Social History   Socioeconomic History   Marital status: Married    Spouse name: Not on file   Number of children: 2   Years of education: Not on file   Highest education level: Not on file  Occupational History   Occupation: Retired Personnel officer  Tobacco Use   Smoking status: Former    Packs/day: 1.00    Years: 30.00    Additional pack years: 0.00    Total pack years: 30.00    Types: Cigarettes    Quit date: 05/21/1989    Years since quitting: 33.8    Passive exposure: Never   Smokeless tobacco: Never  Vaping Use   Vaping Use: Never used  Substance and Sexual Activity   Alcohol use: Yes    Comment: 1 beer and 1 mix drink per day   Drug use: No   Sexual activity: Not on file  Other Topics Concern   Not on file  Social History Narrative   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   Social Determinants of Health   Financial Resource Strain: Low Risk  (02/18/2023)   Overall Financial Resource Strain (CARDIA)    Difficulty of Paying Living Expenses: Not very hard  Food Insecurity: No Food Insecurity (02/18/2023)   Hunger Vital Sign    Worried About Running Out of Food in the Last Year: Never true    Ran Out of Food in the Last Year: Never true  Transportation Needs: No Transportation Needs (02/18/2023)   PRAPARE - Administrator, Civil Service (Medical): No    Lack of Transportation (Non-Medical): No  Physical Activity: Sufficiently Active (09/14/2021)   Exercise Vital Sign    Days of Exercise per Week: 7 days    Minutes of Exercise per Session: 60 min  Stress: No Stress Concern Present  (09/14/2021)   [employer] of Occupational Health - Occupational Stress Questionnaire    Feeling of Stress : Not at all  Social Connections: Moderately Integrated (09/14/2021)   Social Connection and Isolation Panel [NHANES]    Frequency of Communication with Friends and Family: More than three times a week    Frequency of Social Gatherings with Friends and Family: More than three times a week    Attends Religious Services: More than 4 times per year    Active Member of [employer] or Organizations: No    Attends Banker Meetings: Never    Marital Status: Married  Catering manager Violence: Not At Risk (02/13/2023)   Humiliation, Afraid, Rape, and  Kick questionnaire    Fear of Current or Ex-Partner: No    Emotionally Abused: No    Physically Abused: No    Sexually Abused: No    Outpatient Medications Prior to Visit  Medication Sig Dispense Refill   furosemide (LASIX) 20 MG tablet Take 20 mg by mouth daily.     Alcohol Swabs (DROPSAFE ALCOHOL PREP) 70 % PADS USE AS NEEDED TO CLEAN SKIN PRIOR TO FINGERPRICK AND INJECTION 100 each 5   allopurinol (ZYLOPRIM) 100 MG tablet TAKE 1/2 TABLET EVERY OTHER DAY (Patient taking differently: Take 50 mg by mouth every other day.) 45 tablet 0   amLODipine (NORVASC) 10 MG tablet Take 1 tablet (10 mg total) by mouth daily. 90 tablet 3   apixaban (ELIQUIS) 5 MG TABS tablet Take 1 tablet (5 mg total) by mouth 2 (two) times daily. 180 tablet 1   Blood Glucose Monitoring Suppl (ACCU-CHEK GUIDE) w/Device KIT 1 Units by Does not apply route 2 (two) times daily. 1 kit 3   Colchicine 0.6 MG CAPS Take 0.6 mg by mouth daily as needed (Gout).     Dupilumab (DUPIXENT) 300 MG/2ML SOPN Inject 1 application into the skin every 14 (fourteen) days. 300 mL 4   folic acid (FOLVITE) 400 MCG tablet Take 400 mcg by mouth daily.     glucose blood (ACCU-CHEK GUIDE) test strip Use as instructed 100 each 12   hydrALAZINE (APRESOLINE) 50 MG tablet Take 100 mg by mouth 2  (two) times daily.     Lancets (ACCU-CHEK SOFT TOUCH) lancets Use as instructed 100 each 12   leflunomide (ARAVA) 10 MG tablet Take 1 tablet (10 mg total) by mouth daily. 90 tablet 0   nitroGLYCERIN (NITROSTAT) 0.4 MG SL tablet Place 0.4 mg under the tongue every 5 (five) minutes as needed for chest pain.      pantoprazole (PROTONIX) 40 MG tablet Take 1 tablet (40 mg total) by mouth 2 (two) times daily. 60 tablet 2   rosuvastatin (CRESTOR) 40 MG tablet TAKE 1 TABLET EVERY DAY. STOP ATORVASTATIN (Patient taking differently: Take 40 mg by mouth daily.) 90 tablet 3   sucralfate (CARAFATE) 1 GM/10ML suspension Take 10 mLs (1 g total) by mouth 4 (four) times daily -  with meals and at bedtime. 420 mL 0   triamcinolone cream (KENALOG) 0.1 % Apply 1 Application topically daily.     No facility-administered medications prior to visit.    No Known Allergies  Review of Systems  Constitutional:  Negative for chills and fever.  HENT:  Negative for congestion and sore throat.   Eyes:  Negative for pain and discharge.  Respiratory:  Positive for cough and shortness of breath.   Cardiovascular:  Positive for leg swelling. Negative for chest pain and palpitations.  Gastrointestinal:  Positive for constipation. Negative for diarrhea, nausea and vomiting.  Endocrine: Negative for polydipsia and polyuria.  Genitourinary:  Negative for dysuria and hematuria.  Musculoskeletal:  Positive for arthralgias and back pain. Negative for neck pain and neck stiffness.  Skin:  Positive for rash.  Neurological:  Negative for dizziness, weakness, numbness and headaches.  Psychiatric/Behavioral:  Negative for agitation and behavioral problems.        Objective:    Physical Exam Vitals reviewed.  Constitutional:      General: He is not in acute distress.    Appearance: He is not diaphoretic.  HENT:     Head: Normocephalic and atraumatic.     Nose: Nose normal.  Mouth/Throat:     Mouth: Mucous membranes are  moist.  Eyes:     General: No scleral icterus.    Extraocular Movements: Extraocular movements intact.  Cardiovascular:     Rate and Rhythm: Normal rate and regular rhythm.     Pulses: Normal pulses.     Heart sounds: Normal heart sounds. No murmur heard. Pulmonary:     Breath sounds: Normal breath sounds. No wheezing or rales.  Musculoskeletal:     Cervical back: Neck supple. No tenderness.     Right lower leg: No edema.     Left lower leg: No edema.  Feet:     Right foot:     Toenail Condition: Right toenails are abnormally thick. Fungal disease present.    Left foot:     Toenail Condition: Left toenails are abnormally thick. Fungal disease present. Skin:    General: Skin is warm.     Findings: Rash (Diffuse eczematous and dry, scaly patches over LE) present.  Neurological:     General: No focal deficit present.     Mental Status: He is alert and oriented to person, place, and time.     Cranial Nerves: No cranial nerve deficit.     Sensory: No sensory deficit.     Motor: No weakness.  Psychiatric:        Mood and Affect: Mood normal.        Behavior: Behavior normal.     BP (!) 155/83 (BP Location: Left Arm, Patient Position: Sitting, Cuff Size: Normal)   Pulse 79   Ht 5\' 10"  (1.778 m)   SpO2 94%   BMI 25.05 kg/m  Wt Readings from Last 3 Encounters:  02/26/23 174 lb 9.6 oz (79.2 kg)  02/17/23 175 lb 4.8 oz (79.5 kg)  02/06/23 185 lb (83.9 kg)        Assessment & Plan:   Problem List Items Addressed This Visit       Cardiovascular and Mediastinum   Chronic heart failure with preserved ejection fraction    Has leg swelling Lasix PRN for leg swelling On Farxiga Followed by cardiology      Relevant Medications   furosemide (LASIX) 20 MG tablet     Respiratory   Acute bronchitis - Primary    Has persistent cough and mild dyspnea Started empiric azithromycin Promethazine DM syrup as needed for cough Continue incentive spirometry      Relevant  Medications   azithromycin (ZITHROMAX) 250 MG tablet   promethazine-dextromethorphan (PROMETHAZINE-DM) 6.25-15 MG/5ML syrup     Genitourinary   Chronic kidney disease, stage 4 (severe)    Last BMP reviewed, GFR stays around 20, was recently worse due to UGIB Avoid nephrotoxic agents including NSAIDs Lasix PRN for leg swelling Followed by nephrology        Meds ordered this encounter  Medications   azithromycin (ZITHROMAX) 250 MG tablet    Sig: Take 2 tablets on day 1, then 1 tablet daily on days 2 through 5    Dispense:  6 tablet    Refill:  0   promethazine-dextromethorphan (PROMETHAZINE-DM) 6.25-15 MG/5ML syrup    Sig: Take 5 mLs by mouth 4 (four) times daily as needed for cough.    Dispense:  118 mL    Refill:  0     Abreanna Drawdy Concha Se, MD

## 2023-03-21 NOTE — Assessment & Plan Note (Signed)
Has leg swelling Lasix PRN for leg swelling On Farxiga Followed by cardiology

## 2023-03-21 NOTE — Assessment & Plan Note (Signed)
Last BMP reviewed, GFR stays around 20, was recently worse due to UGIB Avoid nephrotoxic agents including NSAIDs Lasix PRN for leg swelling Followed by nephrology

## 2023-03-22 DIAGNOSIS — I2510 Atherosclerotic heart disease of native coronary artery without angina pectoris: Secondary | ICD-10-CM | POA: Diagnosis not present

## 2023-03-22 DIAGNOSIS — I130 Hypertensive heart and chronic kidney disease with heart failure and stage 1 through stage 4 chronic kidney disease, or unspecified chronic kidney disease: Secondary | ICD-10-CM | POA: Diagnosis not present

## 2023-03-22 DIAGNOSIS — I5032 Chronic diastolic (congestive) heart failure: Secondary | ICD-10-CM | POA: Diagnosis not present

## 2023-03-22 DIAGNOSIS — N184 Chronic kidney disease, stage 4 (severe): Secondary | ICD-10-CM | POA: Diagnosis not present

## 2023-03-22 DIAGNOSIS — D500 Iron deficiency anemia secondary to blood loss (chronic): Secondary | ICD-10-CM | POA: Diagnosis not present

## 2023-03-22 DIAGNOSIS — K2990 Gastroduodenitis, unspecified, without bleeding: Secondary | ICD-10-CM | POA: Diagnosis not present

## 2023-03-22 DIAGNOSIS — I480 Paroxysmal atrial fibrillation: Secondary | ICD-10-CM | POA: Diagnosis not present

## 2023-03-22 DIAGNOSIS — E1122 Type 2 diabetes mellitus with diabetic chronic kidney disease: Secondary | ICD-10-CM | POA: Diagnosis not present

## 2023-03-22 DIAGNOSIS — K2970 Gastritis, unspecified, without bleeding: Secondary | ICD-10-CM | POA: Diagnosis not present

## 2023-03-24 ENCOUNTER — Ambulatory Visit (INDEPENDENT_AMBULATORY_CARE_PROVIDER_SITE_OTHER): Payer: Medicare HMO | Admitting: Gastroenterology

## 2023-03-24 ENCOUNTER — Telehealth (INDEPENDENT_AMBULATORY_CARE_PROVIDER_SITE_OTHER): Payer: Self-pay

## 2023-03-25 ENCOUNTER — Telehealth: Payer: Self-pay | Admitting: Rheumatology

## 2023-03-25 ENCOUNTER — Other Ambulatory Visit: Payer: Self-pay

## 2023-03-25 ENCOUNTER — Other Ambulatory Visit: Payer: Self-pay | Admitting: *Deleted

## 2023-03-25 DIAGNOSIS — D500 Iron deficiency anemia secondary to blood loss (chronic): Secondary | ICD-10-CM

## 2023-03-25 DIAGNOSIS — Z79899 Other long term (current) drug therapy: Secondary | ICD-10-CM

## 2023-03-25 DIAGNOSIS — D509 Iron deficiency anemia, unspecified: Secondary | ICD-10-CM | POA: Insufficient documentation

## 2023-03-25 DIAGNOSIS — D631 Anemia in chronic kidney disease: Secondary | ICD-10-CM

## 2023-03-25 DIAGNOSIS — M1A09X0 Idiopathic chronic gout, multiple sites, without tophus (tophi): Secondary | ICD-10-CM

## 2023-03-25 NOTE — Telephone Encounter (Signed)
Lab Orders released as requested.

## 2023-03-25 NOTE — Telephone Encounter (Signed)
Patient's wife Jacob Farmer called requesting labwork orders for Uric Acid and Gout be sent to [facility] in [place].  Patient plans to go on Friday, 03/28/23.

## 2023-03-27 ENCOUNTER — Encounter (HOSPITAL_COMMUNITY)
Admission: RE | Admit: 2023-03-27 | Discharge: 2023-03-27 | Disposition: A | Discharge status: Home / Self Care | Payer: Medicare HMO | Source: Ambulatory Visit | Attending: Nephrology | Admitting: Nephrology

## 2023-03-27 VITALS — BP 144/63 | HR 67 | Temp 98.0°F | Resp 14

## 2023-03-27 DIAGNOSIS — D509 Iron deficiency anemia, unspecified: Secondary | ICD-10-CM | POA: Insufficient documentation

## 2023-03-27 DIAGNOSIS — D500 Iron deficiency anemia secondary to blood loss (chronic): Secondary | ICD-10-CM

## 2023-03-27 DIAGNOSIS — D631 Anemia in chronic kidney disease: Secondary | ICD-10-CM

## 2023-03-27 DIAGNOSIS — N184 Chronic kidney disease, stage 4 (severe): Secondary | ICD-10-CM

## 2023-03-27 DIAGNOSIS — D62 Acute posthemorrhagic anemia: Secondary | ICD-10-CM

## 2023-03-27 MED ORDER — ACETAMINOPHEN 325 MG PO TABS
650.0000 mg | ORAL_TABLET | Freq: Once | ORAL | Status: AC
  Administered 2023-03-27: 650 mg via ORAL
  Filled 2023-03-27: qty 2

## 2023-03-27 MED ORDER — SODIUM CHLORIDE 0.9 % IV SOLN
200.0000 mg | Freq: Once | INTRAVENOUS | Status: AC
  Administered 2023-03-27: 200 mg via INTRAVENOUS
  Filled 2023-03-27: qty 200

## 2023-03-27 MED ORDER — DIPHENHYDRAMINE HCL 25 MG PO CAPS
25.0000 mg | ORAL_CAPSULE | Freq: Once | ORAL | Status: AC
  Administered 2023-03-27: 25 mg via ORAL
  Filled 2023-03-27: qty 1

## 2023-03-27 NOTE — Progress Notes (Signed)
Diagnosis: Anemia in Chronic Kidney Disease  Provider:  Manpreet Bhutani MD  Procedure: Infusion  IV Type: Peripheral, IV Location: L Antecubital  Venofer (Iron Sucrose), Dose: 200 mg  Infusion Start Time: 1048  Infusion Stop Time: 1104  Post Infusion IV Care: Observation period completed and Peripheral IV Discontinued  Discharge: Condition: Good, Destination: Home . AVS Provided  Performed by:  Evelena Peat, RN

## 2023-03-28 DIAGNOSIS — Z79899 Other long term (current) drug therapy: Secondary | ICD-10-CM | POA: Diagnosis not present

## 2023-03-28 DIAGNOSIS — M1A09X0 Idiopathic chronic gout, multiple sites, without tophus (tophi): Secondary | ICD-10-CM | POA: Diagnosis not present

## 2023-03-29 LAB — CBC WITH DIFFERENTIAL/PLATELET
Absolute Monocytes: 859 cells/uL (ref 200–950)
Basophils Absolute: 43 cells/uL (ref 0–200)
Basophils Relative: 0.5 %
Eosinophils Absolute: 221 cells/uL (ref 15–500)
Eosinophils Relative: 2.6 %
HCT: 31.9 % — ABNORMAL LOW (ref 38.5–50.0)
Hemoglobin: 9.9 g/dL — ABNORMAL LOW (ref 13.2–17.1)
Lymphs Abs: 646 cells/uL — ABNORMAL LOW (ref 850–3900)
MCH: 26.6 pg — ABNORMAL LOW (ref 27.0–33.0)
MCHC: 31 g/dL — ABNORMAL LOW (ref 32.0–36.0)
MCV: 85.8 fL (ref 80.0–100.0)
MPV: 12.2 fL (ref 7.5–12.5)
Monocytes Relative: 10.1 %
Neutro Abs: 6732 cells/uL (ref 1500–7800)
Neutrophils Relative %: 79.2 %
Platelets: 226 10*3/uL (ref 140–400)
RBC: 3.72 10*6/uL — ABNORMAL LOW (ref 4.20–5.80)
RDW: 13.5 % (ref 11.0–15.0)
Total Lymphocyte: 7.6 %
WBC: 8.5 10*3/uL (ref 3.8–10.8)

## 2023-03-29 LAB — COMPLETE METABOLIC PANEL WITH GFR
AG Ratio: 1.4 (calc) (ref 1.0–2.5)
ALT: 14 U/L (ref 9–46)
AST: 24 U/L (ref 10–35)
Albumin: 4.1 g/dL (ref 3.6–5.1)
Alkaline phosphatase (APISO): 95 U/L (ref 35–144)
BUN/Creatinine Ratio: 7 (calc) (ref 6–22)
BUN: 32 mg/dL — ABNORMAL HIGH (ref 7–25)
CO2: 22 mmol/L (ref 20–32)
Calcium: 9.3 mg/dL (ref 8.6–10.3)
Chloride: 101 mmol/L (ref 98–110)
Creat: 4.66 mg/dL — ABNORMAL HIGH (ref 0.70–1.28)
Globulin: 2.9 g/dL (calc) (ref 1.9–3.7)
Glucose, Bld: 122 mg/dL (ref 65–139)
Potassium: 3.8 mmol/L (ref 3.5–5.3)
Sodium: 138 mmol/L (ref 135–146)
Total Bilirubin: 0.4 mg/dL (ref 0.2–1.2)
Total Protein: 7 g/dL (ref 6.1–8.1)
eGFR: 12 mL/min/{1.73_m2} — ABNORMAL LOW (ref >=60)

## 2023-03-29 LAB — URIC ACID: Uric Acid, Serum: 6.7 mg/dL (ref 4.0–8.0)

## 2023-03-30 NOTE — Progress Notes (Signed)
His GFR is 12 now.  He is on allopurinol 50 mg every other day and uric acid is mildly elevated.  He is on leflunomide 10 mg p.o. daily. Devki, do we need to adjust his leflunomide dose based on his GFR?

## 2023-03-31 ENCOUNTER — Telehealth: Payer: Self-pay | Admitting: Rheumatology

## 2023-03-31 ENCOUNTER — Encounter (INDEPENDENT_AMBULATORY_CARE_PROVIDER_SITE_OTHER): Payer: Self-pay | Admitting: Gastroenterology

## 2023-03-31 ENCOUNTER — Telehealth (INDEPENDENT_AMBULATORY_CARE_PROVIDER_SITE_OTHER): Payer: Self-pay | Admitting: Gastroenterology

## 2023-03-31 ENCOUNTER — Telehealth: Payer: Self-pay

## 2023-03-31 ENCOUNTER — Ambulatory Visit (INDEPENDENT_AMBULATORY_CARE_PROVIDER_SITE_OTHER): Payer: Medicare HMO | Admitting: Gastroenterology

## 2023-03-31 VITALS — BP 141/72 | HR 79 | Temp 98.7°F | Ht 70.0 in

## 2023-03-31 DIAGNOSIS — K2970 Gastritis, unspecified, without bleeding: Secondary | ICD-10-CM | POA: Diagnosis not present

## 2023-03-31 DIAGNOSIS — K2210 Ulcer of esophagus without bleeding: Secondary | ICD-10-CM | POA: Diagnosis not present

## 2023-03-31 DIAGNOSIS — K2990 Gastroduodenitis, unspecified, without bleeding: Secondary | ICD-10-CM

## 2023-03-31 DIAGNOSIS — D509 Iron deficiency anemia, unspecified: Secondary | ICD-10-CM | POA: Diagnosis not present

## 2023-03-31 MED ORDER — PANTOPRAZOLE SODIUM 40 MG PO TBEC: 40.0000 mg | DELAYED_RELEASE_TABLET | Freq: Two times a day (BID) | ORAL | 1 refills | Status: DC

## 2023-03-31 NOTE — Progress Notes (Signed)
Referring Provider: Anabel Halon, MD Primary Care Physician:  Anabel Halon, MD Primary GI Physician: Levon Hedger   Chief Complaint  Patient presents with   Follow-up    Patient here today for a follow up on IDA. Patient says he went to [facility] at the end of Feb and was told he had a bleeding ulcer after the Egd on 02/16/2023. Patient had a blood transfusion while in patient, and sent home with Sucralfate pantoprazole 40 mg bid.  Had Ferumoxytol 510 mg and is due for four more treatments. He developed some stomach spasms the following day.   HPI:   Jacob Farmer is a 77 y.o. 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 Underwent EGD which showed esophageal ulcer, gastritis/duodenitis, started on PPI BID and carafate 1g QID (x34month), advised to repeat EGD in 6 weeks   Last hgb on 4/12 9.9 4/2 Iron 25, TIBC 372, iron sat 7, ferritin 4  Present:  Patient states that he had an iron infusion on Thursday, he notes that he felt good Friday and Saturday and then Saturday night he began having stomach cramping. He denies any rashes. No diarrhea or vomiting. He denies any nausea. His abdominal pain has improved significantly. He has never had iron infusions before. He states that his brother had similar symptoms as well over the weekend. He notes that he does feel better since receiving the iron, in regards to feeling less fatigued. He denies any rectal bleeding or melena. Notes his appetite was feeling pretty good up until Saturday when his abdomen began  hurting. Denies any dysphagia or odynophagia. He is taking PPI BID, did finish carafate course. He has 3 more upcoming iron infusions.   He notes today he is having some issues with walking, feels this is worse today. He notes some chronic issues with his legs.   Last Colonoscopy:04/03/22- Two colonic angiodysplastic lesions. Treated with                            argon plasma coagulation (APC).                           - Three 3 to 6 mm polyps in the ascending colon and                            in the cecum, removed with a cold snare. Resected                            and retrieved.                           - The distal rectum and anal verge are normal on                            retroflexion view Last Endoscopy: February 16 2023  -  Esophageal ulcer with no stigmata of recent                            bleeding.                           - Gastritis.                           - Duodenitis.                           - No specimens collected.  Recommendations:    Past Medical History:  Diagnosis Date   CAD (coronary artery disease)    Acute myocardial infarction treated with TPA in 1990; 1999-stents to circumflex and RCA; residual total occlusion of the left anterior descending; normal ejection fraction; stress nuclear in 2001-distal anteroseptal ischemia; normal LV function   CKD (chronic kidney disease) stage 4, GFR 15-29 ml/min    First degree heart block    History of gastric ulcer    History of kidney stones    History of MI (myocardial infarction)    1990-  treated w/ TPA   Hyperlipidemia    Hypertension    Jaundice    OA (osteoarthritis)    Prediabetes    Prostate cancer    Stage T1c , Gleason 3+4,  PSA 4.7,  vol 24cc--  scheduled for radiative seed implants   Psoriatic arthritis    RA (rheumatoid arthritis)    Dr. Beatrix Fetters   Sinus bradycardia    Wears dentures     Past Surgical History:  Procedure Laterality Date   BIOPSY  08/27/2019   Procedure: BIOPSY;   Surgeon: Malissa Hippo, MD;  [facility];  Service: Endoscopy;;  gastric   BIOPSY  04/03/2022   Procedure: BIOPSY;  Surgeon: Marguerita Merles, Reuel Boom, MD;  [facility];  Service: Gastroenterology;;   callus removal Left 09/13/2022   left foot   CARDIOVASCULAR STRESS TEST  2001  per Dr Dietrich Pates clinic note   distal anteroseptal ischemia,  normal LVF   COLONOSCOPY  last one 2006 (approx)   COLONOSCOPY WITH PROPOFOL N/A 04/03/2022   Procedure: COLONOSCOPY WITH PROPOFOL;  Surgeon: Dolores Frame, MD;  [facility];  Service: Gastroenterology;  Laterality: N/A;  945   CORONARY ANGIOPLASTY WITH STENT PLACEMENT  1999   DES x2  to CFX and RCA/  residual total occlusion LAD,  normal LVEF   ESOPHAGOGASTRODUODENOSCOPY (EGD) WITH PROPOFOL N/A 08/27/2019   Procedure: ESOPHAGOGASTRODUODENOSCOPY (EGD) WITH PROPOFOL;  Surgeon: Malissa Hippo, MD;  [facility];  Service: Endoscopy;  Laterality: N/A;  10:10   ESOPHAGOGASTRODUODENOSCOPY (EGD) WITH PROPOFOL N/A 04/03/2022   Procedure: ESOPHAGOGASTRODUODENOSCOPY (EGD) WITH PROPOFOL;  Surgeon: Dolores Frame, MD;  [facility];  Service: Gastroenterology;  Laterality: N/A;   ESOPHAGOGASTRODUODENOSCOPY (EGD) WITH PROPOFOL N/A 02/16/2023   Procedure: ESOPHAGOGASTRODUODENOSCOPY (EGD) WITH PROPOFOL;  Surgeon: Dolores Frame, MD;  [facility];  Service: Gastroenterology;  Laterality: N/A;   EYE SURGERY Bilateral    cataract   HOT HEMOSTASIS  04/03/2022   Procedure: HOT HEMOSTASIS (ARGON PLASMA COAGULATION/BICAP);  Surgeon: Marguerita Merles, Reuel Boom, MD;  [facility];  Service: Gastroenterology;;   POLYPECTOMY  04/03/2022   Procedure: POLYPECTOMY;  Surgeon: Dolores Frame, MD;  [facility];  Service: Gastroenterology;;   POSTERIOR CERVICAL FUSION/FORAMINOTOMY N/A 05/26/2017   Procedure: RIGHT C4-5 FORAMINOTOMY WITH EXCISION OF  HERNIATED DISC;  Surgeon: Kerrin Champagne, MD;  Location: [facility];  Service: Orthopedics;  Laterality: N/A;   RADIOACTIVE SEED IMPLANT N/A 01/11/2016   Procedure: RADIOACTIVE SEED IMPLANT/BRACHYTHERAPY IMPLANT;  Surgeon: Marcine Matar, MD;  Location: [place];  Service: Urology;  Laterality: N/A;   73  seeds implanted no seeds founds in bladder    Current Outpatient Medications  Medication Sig Dispense Refill   Alcohol Swabs (DROPSAFE ALCOHOL PREP) 70 % PADS USE AS NEEDED TO CLEAN SKIN PRIOR TO FINGERPRICK AND INJECTION 100 each 5   allopurinol (ZYLOPRIM) 100 MG tablet TAKE 1/2 TABLET EVERY OTHER DAY (Patient taking differently: Take 50 mg by mouth every other day.) 45 tablet 0   amLODipine (NORVASC) 10 MG tablet Take 1 tablet (10 mg total) by mouth daily. 90 tablet 3   apixaban (ELIQUIS) 5 MG TABS tablet Take 1 tablet (5 mg total) by mouth 2 (two) times daily. 180 tablet 1   Blood Glucose Monitoring Suppl (ACCU-CHEK GUIDE) w/Device KIT 1 Units by Does not apply route 2 (two) times daily. 1 kit 3   calcitRIOL (ROCALTROL) 0.25 MCG capsule Take 0.25 mcg by mouth. M, W, F     Colchicine 0.6 MG CAPS Take 0.6 mg by mouth daily as needed (Gout).     Dupilumab (DUPIXENT) 300 MG/2ML SOPN Inject 1 application into the skin every 14 (fourteen) days. 300 mL 4   folic acid (FOLVITE) 400 MCG tablet Take 400 mcg by mouth daily.     furosemide (LASIX) 20 MG tablet Take 20 mg by mouth daily.     glucose blood (ACCU-CHEK GUIDE) test strip Use as instructed 100 each 12   hydrALAZINE (APRESOLINE) 50 MG tablet Take 100 mg by mouth 2 (two) times daily.     Lancets (ACCU-CHEK SOFT TOUCH) lancets Use as instructed 100 each 12   nitroGLYCERIN (NITROSTAT) 0.4 MG SL tablet Place 0.4 mg under the tongue every 5 (five) minutes as needed for chest pain.      pantoprazole (PROTONIX) 40 MG tablet Take 1 tablet (40 mg total) by mouth 2 (two) times daily. 60 tablet 2   rosuvastatin (CRESTOR) 40 MG tablet  TAKE 1 TABLET EVERY DAY. STOP ATORVASTATIN (Patient taking differently: Take 40 mg by mouth daily.) 90 tablet 3   triamcinolone cream (KENALOG) 0.1 % Apply 1 Application topically daily.     leflunomide (ARAVA) 10 MG tablet Take 1 tablet (10 mg total) by mouth daily. (Patient not taking: Reported on 03/31/2023) 90 tablet 0   sucralfate (CARAFATE) 1 GM/10ML suspension Take 10 mLs (1 g total) by mouth 4 (four) times daily -  with meals and at bedtime. (Patient not taking: Reported on 03/31/2023) 420 mL 0   No current facility-administered medications for this visit.    Allergies as of 03/31/2023   (No Known Allergies)    Family History  Problem Relation Age of Onset   Heart attack Mother    Coronary artery disease Father    Ulcerative colitis Father    Ulcers Father    Breast cancer Sister    COPD Brother    Pancreatic cancer Brother    Healthy Sister    Diabetes Brother    Cirrhosis Son    Seizures Son     Social History   Socioeconomic History   Marital status: Married  Spouse name: Not on file   Number of children: 2   Years of education: Not on file   Highest education level: Not on file  Occupational History   Occupation: Retired Personnel officer  Tobacco Use   Smoking status: Former    Packs/day: 1.00    Years: 30.00    Additional pack years: 0.00    Total pack years: 30.00    Types: Cigarettes    Quit date: 05/21/1989    Years since quitting: 33.8    Passive exposure: Never   Smokeless tobacco: Never  Vaping Use   Vaping Use: Never used  Substance and Sexual Activity   Alcohol use: Not Currently    Comment: 1 beer and 1 mix drink per day   Drug use: No   Sexual activity: Not on file  Other Topics Concern   Not on file  Social History Narrative   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   Social Determinants of Health   Financial Resource Strain: Low Risk  (02/18/2023)   Overall Financial Resource Strain (CARDIA)    Difficulty of Paying Living Expenses: Not  very hard  Food Insecurity: No Food Insecurity (02/18/2023)   Hunger Vital Sign    Worried About Running Out of Food in the Last Year: Never true    Ran Out of Food in the Last Year: Never true  Transportation Needs: No Transportation Needs (02/18/2023)   PRAPARE - Administrator, Civil Service (Medical): No    Lack of Transportation (Non-Medical): No  Physical Activity: Sufficiently Active (09/14/2021)   Exercise Vital Sign    Days of Exercise per Week: 7 days    Minutes of Exercise per Session: 60 min  Stress: No Stress Concern Present (09/14/2021)   [employer] of Occupational Health - Occupational Stress Questionnaire    Feeling of Stress : Not at all  Social Connections: Moderately Integrated (09/14/2021)   Social Connection and Isolation Panel [NHANES]    Frequency of Communication with Friends and Family: More than three times a week    Frequency of Social Gatherings with Friends and Family: More than three times a week    Attends Religious Services: More than 4 times per year    Active Member of [employer] or Organizations: No    Attends Engineer, structural: Never    Marital Status: Married   Review of systems General: negative for malaise, night sweats, fever, chills, weight loss Neck: Negative for lumps, goiter, pain and significant neck swelling Resp: Negative for cough, wheezing, dyspnea at rest CV: Negative for chest pain, leg swelling, palpitations, orthopnea GI: denies melena, hematochezia, nausea, vomiting, diarrhea, constipation, dysphagia, odyonophagia, early satiety or unintentional weight loss. +abdominal cramping  MSK: Negative for joint pain or swelling, back pain, and muscle pain. Derm: Negative for itching or rash Psych: Denies depression, anxiety, memory loss, confusion. No homicidal or suicidal ideation.  Heme: Negative for prolonged bleeding, bruising easily, and swollen nodes. Endocrine: Negative for cold or heat intolerance, polyuria,  polydipsia and goiter. Neuro: negative for tremor, gait imbalance, syncope and seizures. The remainder of the review of systems is noncontributory.  Physical Exam: BP (!) 154/63 (BP Location: Left Arm, Patient Position: Sitting, Cuff Size: Normal)   Pulse 82   Temp 98.7 F (37.1 C) (Temporal)   Ht 5\' 10"  (1.778 m)   BMI 25.05 kg/m  General:   Alert and oriented. No distress noted. Pleasant and cooperative.  Head:  Normocephalic and atraumatic. Eyes:  Conjuctiva clear  without scleral icterus. Mouth:  Oral mucosa pink and moist. Good dentition. No lesions. Heart: Normal rate and rhythm, s1 and s2 heart sounds present.  Lungs: Clear lung sounds in all lobes. Respirations equal and unlabored. Abdomen:  +BS, soft, non-tender and non-distended. No rebound or guarding. No HSM or masses noted. Derm: No palmar erythema or jaundice Msk:  Symmetrical without gross deformities. Normal posture. Extremities:  Without edema. Neurologic:  Alert and  oriented x4 Psych:  Alert and cooperative. Normal mood and affect.  Invalid input(s): "6 MONTHS"   ASSESSMENT: Jacob Farmer is a 77 y.o. male presenting today for hospital follow up  Recent admission for anemia/melena, found to have esophageal ulcer, gastritis/duodenitis. Started on PPI BID and course of carafate which he completed. Should continue with PPI BID, recommend scheduling repeat EGD for evaluation of healing. Should continue to avoid all NSAIDs. Indications, risks and benefits of procedure discussed in detail with patient. Patient verbalized understanding and is in agreement to proceed with EGD.  He is receiving iron infusions for his IDA per Dr. Wolfgang Phoenix his nephrologist, last hgb was 9.9 on 4/12, iron remains low. He notes some abdominal cramping that began Saturday night, first iron infusion was Thursday. He notes improvement in fatigue after infusion. Abdominal cramping is improving. He's had no fevers, nausea, vomiting or diarrhea. I  think it is less likely that he had a reaction to the iron infusion given the time frame of his symptoms occurring thereafter, likely a transient gastroenteritis as he reports his brother also had similar symptoms, however, I did advise him to make Dr. Wolfgang Phoenix aware prior to his next iron infusion. He will let me know if abdominal pain does not resolve. He should continue with iron infusions as directed by Dr. Wolfgang Phoenix, they are monitoring his labs closely. He will let me know if he develops any rectal bleeding or melena.    PLAN:  Continue PPI BID  2. Schedule EGD ASA III  3. Continue iron infusions  4. Pt to make me aware of any rectal bleeding or melena   All questions were answered, patient verbalized understanding and is in agreement with plan as outlined above.    Follow Up: Has f/u in August   Liem Copenhaver L. Jeanmarie Hubert, MSN, APRN, AGNP-C Adult-Gerontology Nurse Practitioner [facility]  I have reviewed the note and agree with the APP's assessment as described in this progress note  Depending on findings on EGD, may need to proceed with capsule endoscopy for further evaluation of IDA.  [name], MD Gastroenterology and Hepatology [facility]

## 2023-03-31 NOTE — Telephone Encounter (Signed)
EGD scheduled  Per [employer] Approved Authorization #[id number]  Tracking #[id number] Dates of service 03/31/2023 - 06/30/2023

## 2023-03-31 NOTE — Telephone Encounter (Signed)
    03/31/23  [name] 1946/04/05  What type of surgery is being performed? EGD   When is surgery scheduled? 05/06/23  Clearance to hold Eliquis  Name of physician performing surgery?  Dr. [name] [facility]enter[facility] at [facility] Phone: [phone] Fax: [phone]  Anethesia type (none, local, MAC, general)? MAC

## 2023-03-31 NOTE — Telephone Encounter (Signed)
Patient called and stated that he experienced painful stomach cramping approximately 48 hours after his first iron infusion, on Saturday evening. Patient stated that it began subsiding yesterday, and symptoms have improved. Patient requesting advice as to whether this could be a side effect of his infusion. Stated that he had not called his provider.  Reviewed GI side effects of Venofer listed in [employer] with patient. Patient stated that he had an appointment today with his gastro provider, and could discuss his symptoms then. This RN also encouraged the patient to call Dr. Wolfgang Phoenix and report his symptoms. Patient verbalized understanding.  Wyvonne Lenz, RN

## 2023-03-31 NOTE — Patient Instructions (Signed)
Please continue protonix 40mg  twice daily We will get you scheduled for repeat upper endoscopy, this is important to evaluate for healing of the ulcer we found Please let me know if you have any rectal bleeding or black stools   We will see you in the office for follow up in August

## 2023-03-31 NOTE — Telephone Encounter (Signed)
Pt wife called in and needed to reschedule EGD appt. Pt rescheduled to 05/06/23. Will mail updated instructions.

## 2023-03-31 NOTE — Progress Notes (Signed)
No renal dose adjustment necessary for leflunomide.  Chesley Mires, PharmD, MPH, BCPS, CPP Clinical Pharmacist (Rheumatology and Pulmonology)

## 2023-03-31 NOTE — Progress Notes (Signed)
Patient may continue current dose of leflunomide.  Please forward his lab results to his nephrologist and PCP.

## 2023-03-31 NOTE — Telephone Encounter (Signed)
Patients wife called the office stating she missed a call regarding Aram's lab results and requests a call back. [phone]

## 2023-04-01 ENCOUNTER — Encounter (HOSPITAL_COMMUNITY)
Admission: RE | Admit: 2023-04-01 | Discharge: 2023-04-01 | Disposition: A | Discharge status: Home / Self Care | Payer: Medicare HMO | Source: Ambulatory Visit | Attending: Nephrology | Admitting: Nephrology

## 2023-04-01 VITALS — BP 131/61 | HR 75 | Temp 98.2°F | Resp 16

## 2023-04-01 DIAGNOSIS — D631 Anemia in chronic kidney disease: Secondary | ICD-10-CM

## 2023-04-01 DIAGNOSIS — D509 Iron deficiency anemia, unspecified: Secondary | ICD-10-CM | POA: Diagnosis not present

## 2023-04-01 DIAGNOSIS — D62 Acute posthemorrhagic anemia: Secondary | ICD-10-CM

## 2023-04-01 DIAGNOSIS — N184 Chronic kidney disease, stage 4 (severe): Secondary | ICD-10-CM

## 2023-04-01 MED ORDER — SODIUM CHLORIDE 0.9 % IV SOLN
200.0000 mg | Freq: Once | INTRAVENOUS | Status: AC
  Administered 2023-04-01: 200 mg via INTRAVENOUS
  Filled 2023-04-01: qty 10

## 2023-04-01 MED ORDER — ACETAMINOPHEN 325 MG PO TABS
650.0000 mg | ORAL_TABLET | Freq: Once | ORAL | Status: AC
  Administered 2023-04-01: 650 mg via ORAL
  Filled 2023-04-01: qty 2

## 2023-04-01 MED ORDER — DIPHENHYDRAMINE HCL 25 MG PO CAPS
25.0000 mg | ORAL_CAPSULE | Freq: Once | ORAL | Status: AC
  Administered 2023-04-01: 25 mg via ORAL
  Filled 2023-04-01: qty 1

## 2023-04-01 NOTE — Telephone Encounter (Signed)
Patient with diagnosis of atrial fibrillation on Eliquis for anticoagulation.    Procedure: EGD Date of procedure: 05/06/23   CHA2DS2-VASc Score = 6   This indicates a 9.7% annual risk of stroke. The patient's score is based upon: CHF History: 1 HTN History: 1 Diabetes History: 1 Stroke History: 0 Vascular Disease History: 1 Age Score: 2 Gender Score: 0    CrCl 15 Platelet count 226  Per office protocol, patient can hold Eliquis for 2 days prior to procedure.   Patient will not need bridging with Lovenox (enoxaparin) around procedure.  **This guidance is not considered finalized until pre-operative APP has relayed final recommendations.**

## 2023-04-01 NOTE — Telephone Encounter (Signed)
Pt to hold Eliquis 2 days prior to procedure. Left message to return call

## 2023-04-01 NOTE — Addendum Note (Signed)
Encounter addended by: Kaison Mcparland E, RN on: 04/01/2023 11:33 AM  Actions taken: Therapy plan modified

## 2023-04-01 NOTE — Telephone Encounter (Signed)
Attempted to contact the patient and left message for patient to call the office.  

## 2023-04-01 NOTE — Progress Notes (Signed)
Diagnosis: Iron Deficiency Anemia  Provider:  Manpreet Bhutani MD  Procedure: Infusion  IV Type: Peripheral, IV Location: R Antecubital  Venofer (Iron Sucrose), Dose: 200 mg  Infusion Start Time: 1048  Infusion Stop Time: 1105  Post Infusion IV Care: Patient declined observation and Peripheral IV Discontinued  Discharge: Condition: Stable, Destination: Home . AVS Declined  Performed by:  Wyvonne Lenz, RN

## 2023-04-01 NOTE — Telephone Encounter (Signed)
   Patient Name: Jacob Farmer  DOB: 04-10-1946 MRN: [id number]  Primary Cardiologist: Dina Rich, MD  Chart reviewed as part of pre-operative protocol coverage. Pre-op clearance already addressed by colleagues in earlier phone notes. To summarize recommendations:  -Per office protocol, patient can hold Eliquis for 2 days prior to procedure.   Patient will not need bridging with Lovenox (enoxaparin) around procedure.  Medical clearance was not requested.  Will route this bundled recommendation to requesting provider via Epic fax function and remove from pre-op pool. Please call with questions.  Sharlene Dory, PA-C 04/01/2023, 1:03 PM

## 2023-04-02 ENCOUNTER — Telehealth (INDEPENDENT_AMBULATORY_CARE_PROVIDER_SITE_OTHER): Payer: Self-pay | Admitting: Gastroenterology

## 2023-04-02 NOTE — Telephone Encounter (Signed)
Pt wife states Chelsea sent 90 day supply to [facility] for Pantoprazole and insurance is saying they need more information. Please advise. Thank you

## 2023-04-02 NOTE — Telephone Encounter (Signed)
I tried doing PA on Cover My Meds got this response There is an existing case within the [facility] environment that has the same patient, prescriber, and drug. This case must be finalized before proceeding with similar requests.   I called [facility] with Dawan there about the Key [id number], Case ID # [id number], Rx [id number]. He says that Dr. Standley Dakins had started a PA request on 03/19/2023, this is why I could not get my PA to go through. I advised that this was a hospitalist that had prescribed the medication while the patient was in the ED. I asked that this be cancelled, as we would be the prescribing physicians. Dawan, then tells me they can not cancel this request as the patient had requested it to be filled by Dr. Standley Dakins, He says he can ask me the questions and he can give them over to someone to review to see if the medication would be covered,but we would not be notified one way or the other if approved or not as we are the the ordering doctor. I advised I would answer the questions as best I could using our Doctors information on this patient. I answered the questions and called the patient wife made her aware of all. She states understanding and will call [facility] in the next few days to see if covered and will let me know if she has any issues. I advised if not covered or any issues she may have to ask that they cancel the order through the Ed doctor, Doctor [employer].

## 2023-04-02 NOTE — Telephone Encounter (Signed)
Pt wife returned call and verbalized understanding.  

## 2023-04-03 ENCOUNTER — Encounter (HOSPITAL_COMMUNITY)
Admission: RE | Admit: 2023-04-03 | Discharge: 2023-04-03 | Disposition: A | Discharge status: Home / Self Care | Payer: Medicare HMO | Source: Ambulatory Visit | Attending: Nephrology | Admitting: Nephrology

## 2023-04-03 VITALS — BP 140/70 | HR 74 | Temp 97.8°F | Resp 16

## 2023-04-03 DIAGNOSIS — D509 Iron deficiency anemia, unspecified: Secondary | ICD-10-CM | POA: Diagnosis not present

## 2023-04-03 DIAGNOSIS — D631 Anemia in chronic kidney disease: Secondary | ICD-10-CM

## 2023-04-03 DIAGNOSIS — N184 Chronic kidney disease, stage 4 (severe): Secondary | ICD-10-CM

## 2023-04-03 DIAGNOSIS — D62 Acute posthemorrhagic anemia: Secondary | ICD-10-CM

## 2023-04-03 MED ORDER — DIPHENHYDRAMINE HCL 25 MG PO CAPS
25.0000 mg | ORAL_CAPSULE | Freq: Once | ORAL | Status: AC
  Administered 2023-04-03: 25 mg via ORAL

## 2023-04-03 MED ORDER — ACETAMINOPHEN 325 MG PO TABS
650.0000 mg | ORAL_TABLET | Freq: Once | ORAL | Status: AC
  Administered 2023-04-03: 650 mg via ORAL

## 2023-04-03 MED ORDER — SODIUM CHLORIDE 0.9 % IV SOLN
200.0000 mg | Freq: Once | INTRAVENOUS | Status: AC
  Administered 2023-04-03: 200 mg via INTRAVENOUS
  Filled 2023-04-03: qty 200

## 2023-04-03 NOTE — Telephone Encounter (Signed)
Patient wife called to report they got the medication approved through [facility] and they will be shipping to the patient.

## 2023-04-03 NOTE — Addendum Note (Signed)
Encounter addended by: Wyvonne Lenz, RN on: 04/03/2023 12:32 PM  Actions taken: Therapy plan modified

## 2023-04-03 NOTE — Progress Notes (Signed)
Diagnosis:  Anemia in chronic Kidney disease  Provider  Wolfgang Phoenix, Manpreet  Procedure: Infusion  IV Type: Peripheral, IV Location: L Antecubital  Venofer (Iron Sucrose), Dose: 200 mg  Infusion Start Time: 1143 am  Infusion Stop Time: 1203 pm  Post Infusion IV Care: Observation period completed and Peripheral IV Discontinued  Discharge: Condition: Good, Destination: Home . AVS Provided  Performed by:  Forrest Moron, RN

## 2023-04-07 NOTE — Progress Notes (Signed)
H&P  Chief Complaint: Elevated PSA  History of Present Illness: 77 yo male is here for E/M of elevated PSA--referred by Dr Allena Katz.  PSA in Oct 2022--0.69 PSA 2.16.2024--7.49.  He does have a h/o PCA--  He underwent ultrasound and biopsy of his prostate on 9.27.2016. Prostatic volume 17.6 cc. PSA 4.7. PSA density 0.27.  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   He underwent brachytherapy on 1.26.2017.   Last seen in 2020 for PCa  followup. PSA @ that time 0.20.  [facility] necessitating transfusion.  Now on iron infusions.  Still with low energy.  No gross hematuria, no dysuria.  He has stage IV CKD.  Followed by Dr. Wolfgang Phoenix.    Past Medical History:  Diagnosis Date   CAD (coronary artery disease)    Acute myocardial infarction treated with TPA in 1990; 1999-stents to circumflex and RCA; residual total occlusion of the left anterior descending; normal ejection fraction; stress nuclear in 2001-distal anteroseptal ischemia; normal LV function   CKD (chronic kidney disease) stage 4, GFR 15-29 ml/min    First degree heart block    History of gastric ulcer    History of kidney stones    History of MI (myocardial infarction)    1990-  treated w/ TPA   Hyperlipidemia    Hypertension    Jaundice    OA (osteoarthritis)    Prediabetes    Prostate cancer    Stage T1c , Gleason 3+4,  PSA 4.7,  vol 24cc--  scheduled for radiative seed implants   Psoriatic arthritis    RA (rheumatoid arthritis)    Dr. Beatrix Fetters   Sinus bradycardia    Wears dentures     Past Surgical History:  Procedure Laterality Date   BIOPSY  08/27/2019   Procedure: BIOPSY;  Surgeon: Malissa Hippo, MD;  [facility];  Service: Endoscopy;;  gastric   BIOPSY  04/03/2022   Procedure: BIOPSY;  Surgeon: Marguerita Merles, Reuel Boom, MD;  [facility];  Service: Gastroenterology;;   callus removal Left  09/13/2022   left foot   CARDIOVASCULAR STRESS TEST  2001  per Dr Dietrich Pates clinic note   distal anteroseptal ischemia,  normal LVF   COLONOSCOPY  last one 2006 (approx)   COLONOSCOPY WITH PROPOFOL N/A 04/03/2022   Procedure: COLONOSCOPY WITH PROPOFOL;  Surgeon: Dolores Frame, MD;  [facility];  Service: Gastroenterology;  Laterality: N/A;  945   CORONARY ANGIOPLASTY WITH STENT PLACEMENT  1999   DES x2  to CFX and RCA/  residual total occlusion LAD,  normal LVEF   ESOPHAGOGASTRODUODENOSCOPY (EGD) WITH PROPOFOL N/A 08/27/2019   Procedure: ESOPHAGOGASTRODUODENOSCOPY (EGD) WITH PROPOFOL;  Surgeon: Malissa Hippo, MD;  [facility];  Service: Endoscopy;  Laterality: N/A;  10:10   ESOPHAGOGASTRODUODENOSCOPY (EGD) WITH PROPOFOL N/A 04/03/2022   Procedure: ESOPHAGOGASTRODUODENOSCOPY (EGD) WITH PROPOFOL;  Surgeon: Dolores Frame, MD;  [facility];  Service: Gastroenterology;  Laterality: N/A;   ESOPHAGOGASTRODUODENOSCOPY (EGD) WITH PROPOFOL N/A 02/16/2023   Procedure: ESOPHAGOGASTRODUODENOSCOPY (EGD) WITH PROPOFOL;  Surgeon: Dolores Frame, MD;  [facility];  Service: Gastroenterology;  Laterality: N/A;   EYE SURGERY Bilateral    cataract   HOT HEMOSTASIS  04/03/2022   Procedure: HOT HEMOSTASIS (ARGON PLASMA COAGULATION/BICAP);  Surgeon: Marguerita Merles, Reuel Boom, MD;  [facility];  Service: Gastroenterology;;   POLYPECTOMY  04/03/2022   Procedure: POLYPECTOMY;  Surgeon: Dolores Frame, MD;  [facility];  Service: Gastroenterology;;   POSTERIOR CERVICAL FUSION/FORAMINOTOMY N/A 05/26/2017   Procedure: RIGHT C4-5 FORAMINOTOMY WITH EXCISION OF HERNIATED DISC;  Surgeon: Kerrin Champagne, MD;  Location: [facility] OR;  Service: Orthopedics;  Laterality: N/A;   RADIOACTIVE SEED IMPLANT N/A 01/11/2016   Procedure: RADIOACTIVE SEED IMPLANT/BRACHYTHERAPY IMPLANT;  Surgeon: Marcine Matar, MD;  [facility];  Service: Urology;  Laterality: N/A;   73  seeds implanted no seeds founds in bladder    Home Medications:  Allergies as of 04/08/2023   No Known Allergies      Medication List        Accurate as of April 07, 2023 10:26 AM. If you have any questions, ask your nurse or doctor.          Accu-Chek Guide test strip Generic drug: glucose blood Use as instructed   Accu-Chek Guide w/Device Kit 1 Units by Does not apply route 2 (two) times daily.   accu-chek soft touch lancets Use as instructed   allopurinol 100 MG tablet Commonly known as: ZYLOPRIM TAKE 1/2 TABLET EVERY OTHER DAY What changed:  how much to take how to take this when to take this additional instructions   amLODipine 10 MG tablet Commonly known as: NORVASC Take 1 tablet (10 mg total) by mouth daily.   apixaban 5 MG Tabs tablet Commonly known as: ELIQUIS Take 1 tablet (5 mg total) by mouth 2 (two) times daily.   calcitRIOL 0.25 MCG capsule Commonly known as: ROCALTROL Take 0.25 mcg by mouth. M, W, F   Colchicine 0.6 MG Caps Take 0.6 mg by mouth daily as needed (Gout).   DropSafe Alcohol Prep 70 % Pads USE AS NEEDED TO CLEAN SKIN PRIOR TO FINGERPRICK AND INJECTION   Dupixent 300 MG/2ML Sopn Generic drug: Dupilumab Inject 1 application into the skin every 14 (fourteen) days.   folic acid 400 MCG tablet Commonly known as: FOLVITE Take 400 mcg by mouth daily.   furosemide 20 MG tablet Commonly known as: LASIX Take 20 mg by mouth daily.   hydrALAZINE 50 MG tablet Commonly known as: APRESOLINE Take 100 mg by mouth 2 (two) times daily.   leflunomide 10 MG tablet Commonly known as: ARAVA Take 1 tablet (10 mg total) by mouth daily.   nitroGLYCERIN 0.4 MG SL tablet Commonly known as: NITROSTAT Place 0.4 mg under the tongue every 5 (five) minutes as needed for chest pain.   pantoprazole 40 MG tablet Commonly known as: PROTONIX Take 1 tablet (40 mg total) by  mouth 2 (two) times daily.   rosuvastatin 40 MG tablet Commonly known as: CRESTOR TAKE 1 TABLET EVERY DAY. STOP ATORVASTATIN What changed: See the new instructions.   sucralfate 1 GM/10ML suspension Commonly known as: CARAFATE Take 10 mLs (1 g total) by mouth 4 (four) times daily -  with meals and at bedtime.   triamcinolone cream 0.1 % Commonly known as: KENALOG Apply 1 Application topically daily.        Allergies: No Known Allergies  Family History  Problem Relation Age of Onset   Heart attack Mother    Coronary artery disease Father    Ulcerative colitis Father    Ulcers Father    Breast cancer Sister    COPD Brother    Pancreatic cancer Brother    Healthy Sister    Diabetes Brother    Cirrhosis Son  Seizures Son     Social History:  reports that he quit smoking about 33 years ago. His smoking use included cigarettes. He has a 30.00 pack-year smoking history. He has never been exposed to tobacco smoke. He has never used smokeless tobacco. He reports that he does not currently use alcohol. He reports that he does not use drugs.  ROS: A complete review of systems was performed.  All systems are negative except for pertinent findings as noted.  Physical Exam:  Vital signs in last 24 hours: There were no vitals taken for this visit. Constitutional:  Alert and oriented, No acute distress Cardiovascular: Regular rate  Respiratory: Normal respiratory effor Genitourinary: Normal sphincter tone.  Prostate smooth, flat.  No nodularity. Lymphatic: No lymphadenopathy Neurologic: Grossly intact, no focal deficits Psychiatric: Normal mood and affect  Prior AUS notes reviewed  I have reviewed notes from recent hospitalization  I have reviewed urinalysis results  I have independently reviewed prior imaging--ultrasound from 2021 revealed left renal atrophy  I have reviewed prior PSA and chemistry results  I have reviewed pathology, TRUS  results   Impression/Assessment:  Grade group 2 prostate cancer, status post brachytherapy in 2017.  Inadequate follow-up, now with elevating PSA trend.  Benign exam today.  Plan:  I will recheck PSA today  I will work on setting up with [facility] PET scan  I did discuss with the patient and his wife the fact that he more than likely has recurrent prostate cancer.

## 2023-04-08 ENCOUNTER — Encounter (HOSPITAL_COMMUNITY)
Admission: RE | Admit: 2023-04-08 | Discharge: 2023-04-08 | Disposition: A | Discharge status: Home / Self Care | Payer: Medicare HMO | Source: Ambulatory Visit | Attending: Nephrology | Admitting: Nephrology

## 2023-04-08 ENCOUNTER — Ambulatory Visit: Payer: Medicare HMO | Admitting: Urology

## 2023-04-08 ENCOUNTER — Encounter: Payer: Self-pay | Admitting: Urology

## 2023-04-08 VITALS — BP 136/59 | HR 65 | Temp 97.5°F | Resp 14

## 2023-04-08 VITALS — BP 135/63 | HR 80

## 2023-04-08 DIAGNOSIS — D509 Iron deficiency anemia, unspecified: Secondary | ICD-10-CM

## 2023-04-08 DIAGNOSIS — Z8546 Personal history of malignant neoplasm of prostate: Secondary | ICD-10-CM | POA: Diagnosis not present

## 2023-04-08 DIAGNOSIS — R9721 Rising PSA following treatment for malignant neoplasm of prostate: Secondary | ICD-10-CM

## 2023-04-08 DIAGNOSIS — N184 Chronic kidney disease, stage 4 (severe): Secondary | ICD-10-CM

## 2023-04-08 DIAGNOSIS — D631 Anemia in chronic kidney disease: Secondary | ICD-10-CM

## 2023-04-08 DIAGNOSIS — D62 Acute posthemorrhagic anemia: Secondary | ICD-10-CM

## 2023-04-08 LAB — URINALYSIS, ROUTINE W REFLEX MICROSCOPIC
Bilirubin, UA: NEGATIVE
Glucose, UA: NEGATIVE
Ketones, UA: NEGATIVE
Leukocytes,UA: NEGATIVE
Nitrite, UA: NEGATIVE
Specific Gravity, UA: 1.015 (ref 1.005–1.030)
Urobilinogen, Ur: 0.2 mg/dL (ref 0.2–1.0)
pH, UA: 5 (ref 5.0–7.5)

## 2023-04-08 LAB — MICROSCOPIC EXAMINATION: Bacteria, UA: NONE SEEN

## 2023-04-08 MED ORDER — ACETAMINOPHEN 325 MG PO TABS
650.0000 mg | ORAL_TABLET | Freq: Once | ORAL | Status: AC
  Administered 2023-04-08: 650 mg via ORAL
  Filled 2023-04-08: qty 2

## 2023-04-08 MED ORDER — DIPHENHYDRAMINE HCL 25 MG PO CAPS
25.0000 mg | ORAL_CAPSULE | Freq: Once | ORAL | Status: AC
  Administered 2023-04-08: 25 mg via ORAL
  Filled 2023-04-08: qty 1

## 2023-04-08 MED ORDER — SODIUM CHLORIDE 0.9 % IV SOLN
200.0000 mg | Freq: Once | INTRAVENOUS | Status: AC
  Administered 2023-04-08: 200 mg via INTRAVENOUS
  Filled 2023-04-08: qty 200

## 2023-04-08 NOTE — Progress Notes (Signed)
Diagnosis: Anemia in Chronic Kidney Disease  Provider:  Manpreet Bhutani MD  Procedure: IV Infusion  IV Type: Peripheral, IV Location: L Antecubital  Venofer (Iron Sucrose), Dose: 200 mg  Infusion Start Time: 1223  Infusion Stop Time: 1240  Post Infusion IV Care: Patient declined observation and Peripheral IV Discontinued  Discharge: Condition: Good, Destination: Home . AVS Declined  Performed by:  Evelena Peat, RN

## 2023-04-08 NOTE — Addendum Note (Signed)
Encounter addended by: Evelena Peat, RN on: 04/08/2023 1:04 PM  Actions taken: Therapy plan modified

## 2023-04-09 ENCOUNTER — Other Ambulatory Visit: Payer: Self-pay | Admitting: Urology

## 2023-04-09 DIAGNOSIS — R9721 Rising PSA following treatment for malignant neoplasm of prostate: Secondary | ICD-10-CM

## 2023-04-09 LAB — PSA: Prostate Specific Ag, Serum: 25.9 ng/mL — ABNORMAL HIGH (ref 0.0–4.0)

## 2023-04-10 ENCOUNTER — Encounter (HOSPITAL_COMMUNITY)
Admission: RE | Admit: 2023-04-10 | Discharge: 2023-04-10 | Disposition: A | Discharge status: Home / Self Care | Payer: Medicare HMO | Source: Ambulatory Visit | Attending: Nephrology | Admitting: Nephrology

## 2023-04-10 VITALS — BP 139/53 | HR 70 | Temp 97.5°F | Resp 18

## 2023-04-10 DIAGNOSIS — D509 Iron deficiency anemia, unspecified: Secondary | ICD-10-CM

## 2023-04-10 DIAGNOSIS — D62 Acute posthemorrhagic anemia: Secondary | ICD-10-CM

## 2023-04-10 DIAGNOSIS — D631 Anemia in chronic kidney disease: Secondary | ICD-10-CM

## 2023-04-10 DIAGNOSIS — N184 Chronic kidney disease, stage 4 (severe): Secondary | ICD-10-CM

## 2023-04-10 MED ORDER — SODIUM CHLORIDE 0.9 % IV SOLN
200.0000 mg | Freq: Once | INTRAVENOUS | Status: AC
  Administered 2023-04-10: 200 mg via INTRAVENOUS
  Filled 2023-04-10: qty 200

## 2023-04-10 MED ORDER — DIPHENHYDRAMINE HCL 25 MG PO CAPS
25.0000 mg | ORAL_CAPSULE | Freq: Once | ORAL | Status: AC
  Administered 2023-04-10: 25 mg via ORAL

## 2023-04-10 MED ORDER — ACETAMINOPHEN 325 MG PO TABS
650.0000 mg | ORAL_TABLET | Freq: Once | ORAL | Status: AC
  Administered 2023-04-10: 650 mg via ORAL

## 2023-04-10 NOTE — Progress Notes (Signed)
Diagnosis: Iron Deficiency Anemia  Provider:  Manpreet Bhutani MD  Procedure: IV Infusion  IV Type: Peripheral, IV Location: R Antecubital  Venofer (Iron Sucrose), Dose: 200 mg  Infusion Start Time: 1111  Infusion Stop Time: 1132  Post Infusion IV Care: Patient declined observation and Peripheral IV Discontinued  Discharge: Condition: Stable, Destination: Home . AVS Declined  Performed by:  Wyvonne Lenz, RN

## 2023-04-11 ENCOUNTER — Telehealth: Payer: Self-pay

## 2023-04-11 NOTE — Telephone Encounter (Signed)
Patient's wife Jacob Farmer is aware that his PSA is up to 25 and PET scan is recommend. Patient/wife is aware that someone will follow up to get PET scan scheduled. Patient/wife voiced understanding

## 2023-04-11 NOTE — Telephone Encounter (Signed)
Left detail message on patient's voiced mail making him aware of the MD recommendation along with return office number for any question or concerns.

## 2023-04-11 NOTE — Telephone Encounter (Signed)
-----   Message from Marcine Matar, MD sent at 04/09/2023  8:02 PM EDT ----- Please call pt--psa now up to 25--proceed w/ PET scan ----- Message ----- From: Troy Sine, CMA Sent: 04/09/2023   8:15 AM EDT To: Marcine Matar, MD  Please review

## 2023-04-14 DIAGNOSIS — I5032 Chronic diastolic (congestive) heart failure: Secondary | ICD-10-CM | POA: Diagnosis not present

## 2023-04-14 DIAGNOSIS — K2970 Gastritis, unspecified, without bleeding: Secondary | ICD-10-CM | POA: Diagnosis not present

## 2023-04-14 DIAGNOSIS — D500 Iron deficiency anemia secondary to blood loss (chronic): Secondary | ICD-10-CM | POA: Diagnosis not present

## 2023-04-14 DIAGNOSIS — K2990 Gastroduodenitis, unspecified, without bleeding: Secondary | ICD-10-CM | POA: Diagnosis not present

## 2023-04-14 DIAGNOSIS — N184 Chronic kidney disease, stage 4 (severe): Secondary | ICD-10-CM | POA: Diagnosis not present

## 2023-04-14 DIAGNOSIS — I480 Paroxysmal atrial fibrillation: Secondary | ICD-10-CM | POA: Diagnosis not present

## 2023-04-14 DIAGNOSIS — E1122 Type 2 diabetes mellitus with diabetic chronic kidney disease: Secondary | ICD-10-CM | POA: Diagnosis not present

## 2023-04-14 DIAGNOSIS — I2510 Atherosclerotic heart disease of native coronary artery without angina pectoris: Secondary | ICD-10-CM | POA: Diagnosis not present

## 2023-04-14 DIAGNOSIS — I130 Hypertensive heart and chronic kidney disease with heart failure and stage 1 through stage 4 chronic kidney disease, or unspecified chronic kidney disease: Secondary | ICD-10-CM | POA: Diagnosis not present

## 2023-04-16 NOTE — Telephone Encounter (Signed)
Patient made aware and voiced understanding.

## 2023-04-19 ENCOUNTER — Other Ambulatory Visit: Payer: Self-pay

## 2023-04-19 ENCOUNTER — Inpatient Hospital Stay (HOSPITAL_COMMUNITY)
Admission: EM | Admit: 2023-04-19 | Discharge: 2023-04-25 | DRG: 378 | Disposition: A | Discharge status: Home / Self Care | Payer: Medicare HMO | Attending: Family Medicine | Admitting: Family Medicine

## 2023-04-19 ENCOUNTER — Encounter (HOSPITAL_COMMUNITY): Payer: Self-pay

## 2023-04-19 DIAGNOSIS — K284 Chronic or unspecified gastrojejunal ulcer with hemorrhage: Principal | ICD-10-CM | POA: Diagnosis present

## 2023-04-19 DIAGNOSIS — I130 Hypertensive heart and chronic kidney disease with heart failure and stage 1 through stage 4 chronic kidney disease, or unspecified chronic kidney disease: Secondary | ICD-10-CM | POA: Diagnosis not present

## 2023-04-19 DIAGNOSIS — K3189 Other diseases of stomach and duodenum: Secondary | ICD-10-CM | POA: Diagnosis present

## 2023-04-19 DIAGNOSIS — E872 Acidosis, unspecified: Secondary | ICD-10-CM | POA: Diagnosis present

## 2023-04-19 DIAGNOSIS — D631 Anemia in chronic kidney disease: Secondary | ICD-10-CM | POA: Diagnosis present

## 2023-04-19 DIAGNOSIS — E785 Hyperlipidemia, unspecified: Secondary | ICD-10-CM | POA: Diagnosis present

## 2023-04-19 DIAGNOSIS — Z79620 Long term (current) use of immunosuppressive biologic: Secondary | ICD-10-CM

## 2023-04-19 DIAGNOSIS — Z800 Family history of malignant neoplasm of digestive organs: Secondary | ICD-10-CM

## 2023-04-19 DIAGNOSIS — N179 Acute kidney failure, unspecified: Secondary | ICD-10-CM

## 2023-04-19 DIAGNOSIS — E1122 Type 2 diabetes mellitus with diabetic chronic kidney disease: Secondary | ICD-10-CM

## 2023-04-19 DIAGNOSIS — M069 Rheumatoid arthritis, unspecified: Secondary | ICD-10-CM | POA: Diagnosis present

## 2023-04-19 DIAGNOSIS — I252 Old myocardial infarction: Secondary | ICD-10-CM

## 2023-04-19 DIAGNOSIS — K633 Ulcer of intestine: Secondary | ICD-10-CM | POA: Diagnosis not present

## 2023-04-19 DIAGNOSIS — E119 Type 2 diabetes mellitus without complications: Secondary | ICD-10-CM | POA: Diagnosis not present

## 2023-04-19 DIAGNOSIS — M109 Gout, unspecified: Secondary | ICD-10-CM | POA: Diagnosis present

## 2023-04-19 DIAGNOSIS — K922 Gastrointestinal hemorrhage, unspecified: Secondary | ICD-10-CM | POA: Diagnosis not present

## 2023-04-19 DIAGNOSIS — Z79899 Other long term (current) drug therapy: Secondary | ICD-10-CM

## 2023-04-19 DIAGNOSIS — K289 Gastrojejunal ulcer, unspecified as acute or chronic, without hemorrhage or perforation: Secondary | ICD-10-CM | POA: Diagnosis not present

## 2023-04-19 DIAGNOSIS — K5520 Angiodysplasia of colon without hemorrhage: Secondary | ICD-10-CM | POA: Diagnosis not present

## 2023-04-19 DIAGNOSIS — K5521 Angiodysplasia of colon with hemorrhage: Secondary | ICD-10-CM | POA: Diagnosis not present

## 2023-04-19 DIAGNOSIS — K31819 Angiodysplasia of stomach and duodenum without bleeding: Secondary | ICD-10-CM | POA: Diagnosis present

## 2023-04-19 DIAGNOSIS — K2991 Gastroduodenitis, unspecified, with bleeding: Secondary | ICD-10-CM | POA: Diagnosis not present

## 2023-04-19 DIAGNOSIS — L4050 Arthropathic psoriasis, unspecified: Secondary | ICD-10-CM | POA: Diagnosis present

## 2023-04-19 DIAGNOSIS — R933 Abnormal findings on diagnostic imaging of other parts of digestive tract: Secondary | ICD-10-CM | POA: Diagnosis not present

## 2023-04-19 DIAGNOSIS — I7143 Infrarenal abdominal aortic aneurysm, without rupture: Secondary | ICD-10-CM | POA: Diagnosis not present

## 2023-04-19 DIAGNOSIS — K2970 Gastritis, unspecified, without bleeding: Secondary | ICD-10-CM | POA: Diagnosis not present

## 2023-04-19 DIAGNOSIS — Z8719 Personal history of other diseases of the digestive system: Secondary | ICD-10-CM | POA: Diagnosis not present

## 2023-04-19 DIAGNOSIS — N184 Chronic kidney disease, stage 4 (severe): Secondary | ICD-10-CM

## 2023-04-19 DIAGNOSIS — Z8249 Family history of ischemic heart disease and other diseases of the circulatory system: Secondary | ICD-10-CM | POA: Diagnosis not present

## 2023-04-19 DIAGNOSIS — Z955 Presence of coronary angioplasty implant and graft: Secondary | ICD-10-CM

## 2023-04-19 DIAGNOSIS — Z8546 Personal history of malignant neoplasm of prostate: Secondary | ICD-10-CM

## 2023-04-19 DIAGNOSIS — K319 Disease of stomach and duodenum, unspecified: Secondary | ICD-10-CM | POA: Diagnosis not present

## 2023-04-19 DIAGNOSIS — I10 Essential (primary) hypertension: Secondary | ICD-10-CM | POA: Diagnosis not present

## 2023-04-19 DIAGNOSIS — I5032 Chronic diastolic (congestive) heart failure: Secondary | ICD-10-CM | POA: Diagnosis not present

## 2023-04-19 DIAGNOSIS — D509 Iron deficiency anemia, unspecified: Secondary | ICD-10-CM | POA: Diagnosis present

## 2023-04-19 DIAGNOSIS — D62 Acute posthemorrhagic anemia: Secondary | ICD-10-CM | POA: Diagnosis present

## 2023-04-19 DIAGNOSIS — Z87891 Personal history of nicotine dependence: Secondary | ICD-10-CM | POA: Diagnosis not present

## 2023-04-19 DIAGNOSIS — K551 Chronic vascular disorders of intestine: Secondary | ICD-10-CM | POA: Diagnosis not present

## 2023-04-19 DIAGNOSIS — Z833 Family history of diabetes mellitus: Secondary | ICD-10-CM

## 2023-04-19 DIAGNOSIS — N270 Small kidney, unilateral: Secondary | ICD-10-CM | POA: Diagnosis not present

## 2023-04-19 DIAGNOSIS — K921 Melena: Secondary | ICD-10-CM | POA: Diagnosis not present

## 2023-04-19 DIAGNOSIS — Z7901 Long term (current) use of anticoagulants: Secondary | ICD-10-CM

## 2023-04-19 DIAGNOSIS — I2510 Atherosclerotic heart disease of native coronary artery without angina pectoris: Secondary | ICD-10-CM | POA: Diagnosis not present

## 2023-04-19 DIAGNOSIS — Z923 Personal history of irradiation: Secondary | ICD-10-CM

## 2023-04-19 DIAGNOSIS — E876 Hypokalemia: Secondary | ICD-10-CM | POA: Diagnosis not present

## 2023-04-19 DIAGNOSIS — D649 Anemia, unspecified: Secondary | ICD-10-CM | POA: Diagnosis not present

## 2023-04-19 DIAGNOSIS — Z803 Family history of malignant neoplasm of breast: Secondary | ICD-10-CM

## 2023-04-19 DIAGNOSIS — R809 Proteinuria, unspecified: Secondary | ICD-10-CM | POA: Diagnosis not present

## 2023-04-19 DIAGNOSIS — Z825 Family history of asthma and other chronic lower respiratory diseases: Secondary | ICD-10-CM

## 2023-04-19 DIAGNOSIS — I480 Paroxysmal atrial fibrillation: Secondary | ICD-10-CM | POA: Diagnosis not present

## 2023-04-19 DIAGNOSIS — E1129 Type 2 diabetes mellitus with other diabetic kidney complication: Secondary | ICD-10-CM | POA: Diagnosis not present

## 2023-04-19 DIAGNOSIS — N170 Acute kidney failure with tubular necrosis: Secondary | ICD-10-CM | POA: Diagnosis not present

## 2023-04-19 DIAGNOSIS — M5136 Other intervertebral disc degeneration, lumbar region: Secondary | ICD-10-CM | POA: Diagnosis present

## 2023-04-19 LAB — CBC
HCT: 16.1 % — ABNORMAL LOW (ref 39.0–52.0)
Hemoglobin: 4.9 g/dL — CL (ref 13.0–17.0)
MCH: 28.5 pg (ref 26.0–34.0)
MCHC: 30.4 g/dL (ref 30.0–36.0)
MCV: 93.6 fL (ref 80.0–100.0)
Platelets: 238 10*3/uL (ref 150–400)
RBC: 1.72 MIL/uL — ABNORMAL LOW (ref 4.22–5.81)
RDW: 22 % — ABNORMAL HIGH (ref 11.5–15.5)
WBC: 10.8 10*3/uL — ABNORMAL HIGH (ref 4.0–10.5)
nRBC: 0.6 % — ABNORMAL HIGH (ref 0.0–0.2)

## 2023-04-19 LAB — URINALYSIS, ROUTINE W REFLEX MICROSCOPIC
Bacteria, UA: NONE SEEN
Bilirubin Urine: NEGATIVE
Glucose, UA: NEGATIVE mg/dL
Hgb urine dipstick: NEGATIVE
Ketones, ur: NEGATIVE mg/dL
Leukocytes,Ua: NEGATIVE
Nitrite: NEGATIVE
Protein, ur: 30 mg/dL — AB
Specific Gravity, Urine: 1.012 (ref 1.005–1.030)
pH: 5 (ref 5.0–8.0)

## 2023-04-19 LAB — TYPE AND SCREEN: Unit division: 0

## 2023-04-19 LAB — COMPREHENSIVE METABOLIC PANEL
ALT: 34 U/L (ref 0–44)
AST: 28 U/L (ref 15–41)
Albumin: 2.9 g/dL — ABNORMAL LOW (ref 3.5–5.0)
Alkaline Phosphatase: 95 U/L (ref 38–126)
Anion gap: 11 (ref 5–15)
BUN: 75 mg/dL — ABNORMAL HIGH (ref 8–23)
CO2: 17 mmol/L — ABNORMAL LOW (ref 22–32)
Calcium: 8.5 mg/dL — ABNORMAL LOW (ref 8.9–10.3)
Chloride: 106 mmol/L (ref 98–111)
Creatinine, Ser: 4.71 mg/dL — ABNORMAL HIGH (ref 0.61–1.24)
GFR, Estimated: 12 mL/min — ABNORMAL LOW (ref >60)
Glucose, Bld: 138 mg/dL — ABNORMAL HIGH (ref 70–99)
Potassium: 3.6 mmol/L (ref 3.5–5.1)
Sodium: 134 mmol/L — ABNORMAL LOW (ref 135–145)
Total Bilirubin: 0.5 mg/dL (ref 0.3–1.2)
Total Protein: 6.2 g/dL — ABNORMAL LOW (ref 6.5–8.1)

## 2023-04-19 LAB — PREPARE RBC (CROSSMATCH)

## 2023-04-19 LAB — BPAM RBC
Blood Product Expiration Date: 202405232359
Blood Product Expiration Date: 202405232359

## 2023-04-19 LAB — POC OCCULT BLOOD, ED: Fecal Occult Blood: POSITIVE

## 2023-04-19 MED ORDER — PANTOPRAZOLE SODIUM 40 MG IV SOLR
40.0000 mg | Freq: Two times a day (BID) | INTRAVENOUS | Status: DC
  Administered 2023-04-19 – 2023-04-25 (×12): 40 mg via INTRAVENOUS
  Filled 2023-04-19 (×12): qty 10

## 2023-04-19 MED ORDER — SUCRALFATE 1 GM/10ML PO SUSP
1.0000 g | Freq: Three times a day (TID) | ORAL | Status: DC
  Administered 2023-04-20 – 2023-04-25 (×21): 1 g via ORAL
  Filled 2023-04-19 (×21): qty 10

## 2023-04-19 MED ORDER — HYDRALAZINE HCL 25 MG PO TABS
100.0000 mg | ORAL_TABLET | Freq: Two times a day (BID) | ORAL | Status: DC
  Administered 2023-04-19 – 2023-04-25 (×11): 100 mg via ORAL
  Filled 2023-04-19 (×12): qty 4

## 2023-04-19 MED ORDER — POTASSIUM CHLORIDE CRYS ER 20 MEQ PO TBCR
20.0000 meq | EXTENDED_RELEASE_TABLET | Freq: Every day | ORAL | Status: DC
  Administered 2023-04-20: 20 meq via ORAL
  Filled 2023-04-19: qty 1

## 2023-04-19 MED ORDER — LEFLUNOMIDE 10 MG PO TABS
10.0000 mg | ORAL_TABLET | Freq: Every day | ORAL | Status: DC
  Administered 2023-04-20 – 2023-04-25 (×6): 10 mg via ORAL
  Filled 2023-04-19 (×9): qty 1

## 2023-04-19 MED ORDER — CHLORHEXIDINE GLUCONATE CLOTH 2 % EX PADS
6.0000 | MEDICATED_PAD | Freq: Every day | CUTANEOUS | Status: DC
  Administered 2023-04-19 – 2023-04-24 (×5): 6 via TOPICAL

## 2023-04-19 MED ORDER — LACTATED RINGERS IV SOLN: INTRAVENOUS | Status: DC

## 2023-04-19 MED ORDER — PANTOPRAZOLE 80MG IVPB - SIMPLE MED
80.0000 mg | Freq: Once | INTRAVENOUS | Status: AC
  Administered 2023-04-19: 80 mg via INTRAVENOUS
  Filled 2023-04-19: qty 100

## 2023-04-19 MED ORDER — AMLODIPINE BESYLATE 5 MG PO TABS
10.0000 mg | ORAL_TABLET | Freq: Every day | ORAL | Status: DC
  Administered 2023-04-20 – 2023-04-25 (×6): 10 mg via ORAL
  Filled 2023-04-19 (×6): qty 2

## 2023-04-19 MED ORDER — SODIUM CHLORIDE 0.9% IV SOLUTION: Freq: Once | INTRAVENOUS | Status: AC

## 2023-04-19 MED ORDER — ROSUVASTATIN CALCIUM 20 MG PO TABS
40.0000 mg | ORAL_TABLET | Freq: Every day | ORAL | Status: DC
  Administered 2023-04-20 – 2023-04-25 (×6): 40 mg via ORAL
  Filled 2023-04-19 (×6): qty 2

## 2023-04-19 NOTE — ED Triage Notes (Signed)
Pt presents with black stools x 3 weeks. Pt reports associated fatigue and lightheadedness. Pt with noted pallor during triage.

## 2023-04-19 NOTE — ED Notes (Signed)
Fall Risk bracelet applied to patient's wrist.

## 2023-04-19 NOTE — H&P (Signed)
History and Physical    Patient: Jacob Farmer WGN:[id number] DOB: [birth year] DOA: 04/19/2023 DOS: the patient was seen and examined on 04/19/2023 PCP: Anabel Halon, MD  Patient coming from: Home  Chief Complaint: No chief complaint on file.  HPI: Jacob Farmer is a 77 y.o. male with medical history significant of chronic kidney disease stage IV, coronary artery disease with stents, history of colonic AVM, upper GI bleed secondary to bleeding ulcer, psoriatic arthritis, hypertension.  Patient was hospitalized in February due to bleeding ulcer which appeared to resolve after EGD and Carafate along with PPI.  Since his discharge, he has had progressive weakness.  Has noticed black stools, which she thought was due to the iron infusion he has been having.  He did get a call from his nephrologist who recommended that he come to [facility] for evaluation due to an elevated creatinine that was noticed about a month ago.  He has been having continued fatigue and decreased appetite.  No abdominal pain, flank pain, fevers, chills.  Denies chest pain.  Review of Systems: As mentioned in the history of present illness. All other systems reviewed and are negative. Past Medical History:  Diagnosis Date   CAD (coronary artery disease)    Acute myocardial infarction treated with TPA in 1990; 1999-stents to circumflex and RCA; residual total occlusion of the left anterior descending; normal ejection fraction; stress nuclear in 2001-distal anteroseptal ischemia; normal LV function   CKD (chronic kidney disease) stage 4, GFR 15-29 ml/min (HCC)    First degree heart block    History of gastric ulcer    History of kidney stones    History of MI (myocardial infarction)    1990-  treated w/ TPA   Hyperlipidemia    Hypertension    Jaundice    OA (osteoarthritis)    Prediabetes    Prostate cancer (HCC)    Stage T1c , Gleason 3+4,  PSA 4.7,  vol 24cc--  scheduled for radiative seed implants    Psoriatic arthritis (HCC)    RA (rheumatoid arthritis) (HCC)    Dr. Beatrix Fetters   Sinus bradycardia    Wears dentures    Past Surgical History:  Procedure Laterality Date   BIOPSY  08/27/2019   Procedure: BIOPSY;  Surgeon: Malissa Hippo, MD;  [facility];  Service: Endoscopy;;  gastric   BIOPSY  04/03/2022   Procedure: BIOPSY;  Surgeon: Marguerita Merles, Reuel Boom, MD;  [facility];  Service: Gastroenterology;;   callus removal Left 09/13/2022   left foot   CARDIOVASCULAR STRESS TEST  2001  per Dr Dietrich Pates clinic note   distal anteroseptal ischemia,  normal LVF   COLONOSCOPY  last one 2006 (approx)   COLONOSCOPY WITH PROPOFOL N/A 04/03/2022   Procedure: COLONOSCOPY WITH PROPOFOL;  Surgeon: Dolores Frame, MD;  [facility];  Service: Gastroenterology;  Laterality: N/A;  945   CORONARY ANGIOPLASTY WITH STENT PLACEMENT  1999   DES x2  to CFX and RCA/  residual total occlusion LAD,  normal LVEF   ESOPHAGOGASTRODUODENOSCOPY (EGD) WITH PROPOFOL N/A 08/27/2019   Procedure: ESOPHAGOGASTRODUODENOSCOPY (EGD) WITH PROPOFOL;  Surgeon: Malissa Hippo, MD;  [facility];  Service: Endoscopy;  Laterality: N/A;  10:10   ESOPHAGOGASTRODUODENOSCOPY (EGD) WITH PROPOFOL N/A 04/03/2022   Procedure: ESOPHAGOGASTRODUODENOSCOPY (EGD) WITH PROPOFOL;  Surgeon: Dolores Frame, MD;  [facility];  Service: Gastroenterology;  Laterality: N/A;   ESOPHAGOGASTRODUODENOSCOPY (EGD) WITH PROPOFOL N/A  02/16/2023   Procedure: ESOPHAGOGASTRODUODENOSCOPY (EGD) WITH PROPOFOL;  Surgeon: Dolores Frame, MD;  [facility];  Service: Gastroenterology;  Laterality: N/A;   EYE SURGERY Bilateral    cataract   HOT HEMOSTASIS  04/03/2022   Procedure: HOT HEMOSTASIS (ARGON PLASMA COAGULATION/BICAP);  Surgeon: Marguerita Merles, Reuel Boom, MD;  [facility];  Service: Gastroenterology;;   POLYPECTOMY  04/03/2022   Procedure:  POLYPECTOMY;  Surgeon: Dolores Frame, MD;  [facility];  Service: Gastroenterology;;   POSTERIOR CERVICAL FUSION/FORAMINOTOMY N/A 05/26/2017   Procedure: RIGHT C4-5 FORAMINOTOMY WITH EXCISION OF HERNIATED DISC;  Surgeon: Kerrin Champagne, MD;  Location: [facility];  Service: Orthopedics;  Laterality: N/A;   RADIOACTIVE SEED IMPLANT N/A 01/11/2016   Procedure: RADIOACTIVE SEED IMPLANT/BRACHYTHERAPY IMPLANT;  Surgeon: Marcine Matar, MD;  Location: [facility];  Service: Urology;  Laterality: N/A;   73  seeds implanted no seeds founds in bladder   Social History:  reports that he quit smoking about 33 years ago. His smoking use included cigarettes. He has a 30.00 pack-year smoking history. He has never been exposed to tobacco smoke. He has never used smokeless tobacco. He reports that he does not currently use alcohol. He reports that he does not use drugs.  No Known Allergies  Family History  Problem Relation Age of Onset   Heart attack Mother    Coronary artery disease Father    Ulcerative colitis Father    Ulcers Father    Breast cancer Sister    COPD Brother    Pancreatic cancer Brother    Healthy Sister    Diabetes Brother    Cirrhosis Son    Seizures Son     Prior to Admission medications   Medication Sig Start Date End Date Taking? Authorizing Provider  allopurinol (ZYLOPRIM) 100 MG tablet TAKE 1/2 TABLET EVERY OTHER DAY Patient taking differently: Take 50 mg by mouth every other day. 04/10/22  Yes Deveshwar, Janalyn Rouse, MD  amLODipine (NORVASC) 10 MG tablet Take 1 tablet (10 mg total) by mouth daily. 12/25/22  Yes Anabel Halon, MD  apixaban (ELIQUIS) 5 MG TABS tablet Take 1 tablet (5 mg total) by mouth 2 (two) times daily. 03/12/23  Yes BranchDorothe Pea, MD  calcitRIOL (ROCALTROL) 0.25 MCG capsule Take 0.25 mcg by mouth. M, W, F   Yes [provider]  Colchicine 0.6 MG CAPS Take 0.6 mg by mouth daily as needed (Gout).   Yes [provider]  Dupilumab (DUPIXENT) 300 MG/2ML SOPN Inject 1 application into the skin every 14 (fourteen) days. 02/13/22  Yes Janalyn Harder, MD  folic acid (FOLVITE) 400 MCG tablet Take 400 mcg by mouth daily.   Yes [provider]  furosemide (LASIX) 20 MG tablet Take 20 mg by mouth 2 (two) times daily.   Yes [provider]  hydrALAZINE (APRESOLINE) 50 MG tablet Take 100 mg by mouth 2 (two) times daily. 10/14/22 10/14/23 Yes [provider]  leflunomide (ARAVA) 10 MG tablet Take 1 tablet (10 mg total) by mouth daily. 01/02/23  Yes Deveshwar, Janalyn Rouse, MD  nitroGLYCERIN (NITROSTAT) 0.4 MG SL tablet Place 0.4 mg under the tongue every 5 (five) minutes as needed for chest pain.  08/03/19  Yes [provider]  pantoprazole (PROTONIX) 40 MG tablet Take 1 tablet (40 mg total) by mouth 2 (two) times daily. 03/31/23  Yes Carlan, Chelsea L, NP  potassium chloride SA (KLOR-CON M) 20 MEQ tablet Take 20 mEq by  mouth daily. 03/21/23 03/20/24 Yes [provider]  rosuvastatin (CRESTOR) 40 MG tablet TAKE 1 TABLET EVERY DAY. STOP ATORVASTATIN Patient taking differently: Take 40 mg by mouth daily. 01/21/23  Yes BranchDorothe Pea, MD  triamcinolone cream (KENALOG) 0.1 % Apply 1 Application topically daily. 02/17/23  Yes Johnson, Clanford L, MD  Alcohol Swabs (DROPSAFE ALCOHOL PREP) 70 % PADS USE AS NEEDED TO CLEAN SKIN PRIOR TO FINGERPRICK AND INJECTION 09/07/21   Elenore Paddy, NP  Blood Glucose Monitoring Suppl (ACCU-CHEK GUIDE) w/Device KIT 1 Units by Does not apply route 2 (two) times daily. 11/23/20   Lilly Cove C, MD  glucose blood (ACCU-CHEK GUIDE) test strip Use as instructed 01/11/21   Lilly Cove C, MD  Lancets (ACCU-CHEK SOFT TOUCH) lancets Use as instructed 11/23/20   Wilson Singer, MD  sucralfate (CARAFATE) 1 GM/10ML suspension Take 10 mLs (1 g total) by mouth 4 (four) times daily -  with meals and at bedtime. Patient not taking: Reported on 04/19/2023 02/17/23    Cleora Fleet, MD    Physical Exam: Vitals:   04/19/23 2030 04/19/23 2102 04/19/23 2118 04/19/23 2130  BP: (!) 122/59 128/68 122/62 (!) 127/59  Pulse: (!) 43 87 89 88  Resp: 18 18 18 18   Temp:  98.2 F (36.8 C) 97.9 F (36.6 C)   TempSrc:      SpO2: 99% 99% 99% 99%  Weight:      Height:       General: Elderly male. Awake and alert and oriented x3. No acute cardiopulmonary distress.  HEENT: Normocephalic atraumatic.  Right and left ears normal in appearance.  Pupils equal, round, reactive to light. Extraocular muscles are intact. Sclerae anicteric and noninjected.  Moist mucosal membranes. No mucosal lesions.  Neck: Neck supple without lymphadenopathy. No carotid bruits. No masses palpated.  Cardiovascular: Regular rate with normal S1-S2 sounds. No murmurs, rubs, gallops auscultated. No JVD.  Respiratory: Good respiratory effort with no wheezes, rales, rhonchi. Lungs clear to auscultation bilaterally.  No accessory muscle use. Abdomen: Soft, nontender, nondistended. Active bowel sounds. No masses or hepatosplenomegaly  Skin: No rashes, lesions, or ulcerations.  Dry, warm to touch. 2+ dorsalis pedis and radial pulses. Musculoskeletal: No calf or leg pain. All major joints not erythematous nontender.  No upper or lower joint deformation.  Good ROM.  No contractures  Psychiatric: Intact judgment and insight. Pleasant and cooperative. Neurologic: No focal neurological deficits. Strength is 5/5 and symmetric in upper and lower extremities.  Cranial nerves II through XII are grossly intact.  Data Reviewed: Results for orders placed or performed during the hospital encounter of 04/19/23 (from the past 24 hour(s))  Comprehensive metabolic panel     Status: Abnormal   Collection Time: 04/19/23  6:41 PM  Result Value Ref Range   Sodium 134 (L) 135 - 145 mmol/L   Potassium 3.6 3.5 - 5.1 mmol/L   Chloride 106 98 - 111 mmol/L   CO2 17 (L) 22 - 32 mmol/L   Glucose, Bld 138 (H) 70 - 99  mg/dL   BUN 75 (H) 8 - 23 mg/dL   Creatinine, Ser 6.04 (H) 0.61 - 1.24 mg/dL   Calcium 8.5 (L) 8.9 - 10.3 mg/dL   Total Protein 6.2 (L) 6.5 - 8.1 g/dL   Albumin 2.9 (L) 3.5 - 5.0 g/dL   AST 28 15 - 41 U/L   ALT 34 0 - 44 U/L   Alkaline Phosphatase 95 38 - 126 U/L   Total  Bilirubin 0.5 0.3 - 1.2 mg/dL   GFR, Estimated 12 (L) >60 mL/min   Anion gap 11 5 - 15  CBC     Status: Abnormal   Collection Time: 04/19/23  6:41 PM  Result Value Ref Range   WBC 10.8 (H) 4.0 - 10.5 K/uL   RBC 1.72 (L) 4.22 - 5.81 MIL/uL   Hemoglobin 4.9 (LL) 13.0 - 17.0 g/dL   HCT 16.1 (L) 09.6 - 04.5 %   MCV 93.6 80.0 - 100.0 fL   MCH 28.5 26.0 - 34.0 pg   MCHC 30.4 30.0 - 36.0 g/dL   RDW 40.9 (H) 81.1 - 91.4 %   Platelets 238 150 - 400 K/uL   nRBC 0.6 (H) 0.0 - 0.2 %  Prepare RBC (crossmatch)     Status: None   Collection Time: 04/19/23  7:00 PM  Result Value Ref Range   Order Confirmation      ORDER PROCESSED BY BLOOD BANK Performed at [facility], [address], [place], [place] [address]   Type and screen Ordered by PROVIDER DEFAULT     Status: None (Preliminary result)   Collection Time: 04/19/23  7:39 PM  Result Value Ref Range   ABO/RH(D) O POS    Antibody Screen NEG    Sample Expiration 04/22/2023,2359    Unit Number [id number]    Blood Component Type RBC, LR IRR    Unit division 00    Status of Unit ISSUED    Transfusion Status OK TO TRANSFUSE    Crossmatch Result      Compatible Performed at [facility], [address] [place], [place] [address]    Unit Number [id number]    Blood Component Type RBC, LR IRR    Unit division 00    Status of Unit ALLOCATED    Transfusion Status OK TO TRANSFUSE    Crossmatch Result Compatible   POC occult blood, ED     Status: Normal   Collection Time: 04/19/23  8:39 PM  Result Value Ref Range   Fecal Occult Blood Positive     No results found.   Assessment and Plan: No notes have been filed under this hospital service. Service:  Hospitalist  Principal Problem:   Acute blood loss anemia Active Problems:   Essential hypertension   Coronary artery disease involving native coronary artery of native heart without angina pectoris   Rheumatoid arthritis (HCC)   Chronic kidney disease, stage 4 (severe) (HCC)   Type 2 diabetes mellitus with diabetic chronic kidney disease (HCC)   Paroxysmal atrial fibrillation (HCC)   Chronic heart failure with preserved ejection fraction (HCC)   AVM (arteriovenous malformation) of colon   Upper GI bleed   Gastrointestinal hemorrhage  Acute blood loss anemia secondary to upper GI bleed/GI hemorrhage/AVM of colon Transfuse 2 units Check CBC in the morning GI to see N.p.o. after midnight Will restart Carafate Protonix PAF on anticoagulation Hold anticoagulation Stage III chronic kidney disease Serum creatinine is 4.71.  About 3 weeks ago it was 4.66.  Prior to this it was 3.31. Will give IV fluids Recheck in the morning Type 2 diabetes N.p.o. Hypertension with CAD Continue home antihypertensives   Advance Care Planning:   Code Status: Full Code confirmed by patient  Consults: GI  Family Communication: Wife present during interview and exam.  She contributed to the history  Severity of Illness: The appropriate patient status for this patient is INPATIENT. Inpatient status is judged to be reasonable and necessary in  order to provide the required intensity of service to ensure the patient's safety. The patient's presenting symptoms, physical exam findings, and initial radiographic and laboratory data in the context of their chronic comorbidities is felt to place them at high risk for further clinical deterioration. Furthermore, it is not anticipated that the patient will be medically stable for discharge from the hospital within 2 midnights of admission.   * I certify that at the point of admission it is my clinical judgment that the patient will require inpatient hospital care  spanning beyond 2 midnights from the point of admission due to high intensity of service, high risk for further deterioration and high frequency of surveillance required.*  Author: Levie Heritage, DO 04/19/2023 9:42 PM  For on call review [email or web address].

## 2023-04-19 NOTE — ED Provider Notes (Signed)
[place] EMERGENCY DEPARTMENT AT [facility] Provider Note  CSN: [id number] Arrival date & time: 04/19/23 1806  Chief Complaint(s) No chief complaint on file.  HPI Jacob Farmer is a 77 y.o. male with history of coronary artery disease, chronic kidney disease, gastric ulcer, hypertension, hyperlipidemia, prostate cancer presents to the emergency department with black stool.  Patient reports around 3 weeks of black stools.  He denies any bloody stool.  He also reports recent weakness, fatigue, lightheadedness.  No syncope.  No chest pain or shortness of breath.  Reports that he is still urinating normally.  He has been on Eliquis for A-fib.   His nephrologist Dr. Wolfgang Phoenix is reviewing his labs today in anticipation of a upcoming visit and noticed that his creatinine 3 weeks ago had risen significantly.  He was concerned about this and called the patient who also reported the black stools.  He advised the patient to come to the ER.  Past Medical History Past Medical History:  Diagnosis Date   CAD (coronary artery disease)    Acute myocardial infarction treated with TPA in 1990; 1999-stents to circumflex and RCA; residual total occlusion of the left anterior descending; normal ejection fraction; stress nuclear in 2001-distal anteroseptal ischemia; normal LV function   CKD (chronic kidney disease) stage 4, GFR 15-29 ml/min (HCC)    First degree heart block    History of gastric ulcer    History of kidney stones    History of MI (myocardial infarction)    1990-  treated w/ TPA   Hyperlipidemia    Hypertension    Jaundice    OA (osteoarthritis)    Prediabetes    Prostate cancer (HCC)    Stage T1c , Gleason 3+4,  PSA 4.7,  vol 24cc--  scheduled for radiative seed implants   Psoriatic arthritis (HCC)    RA (rheumatoid arthritis) (HCC)    Dr. 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   Gastritis and gastroduodenitis 02/06/2023   Bleeding from right ear 12/25/2022   Chronic heart failure with preserved ejection fraction (HCC) 07/22/2022   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   Dizziness 04/09/2022   Encounter for general adult medical examination with abnormal findings 01/25/2022   Paroxysmal atrial fibrillation (HCC) 01/22/2022   Allergic dermatitis 09/12/2021   Herniated cervical disc 05/26/2017    Class: Acute   Cervical spinal stenosis 05/26/2017   Primary osteoarthritis of both hands 02/04/2017   History of gout 02/04/2017   DDD (degenerative disc disease), lumbar 02/04/2017   High risk medication use 01/28/2017   History of prostate cancer 01/28/2017   Chronic kidney disease, stage 4 (severe) (HCC) 12/27/2011   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   Rheumatoid arthritis (HCC) 01/04/2011   SINUS BRADYCARDIA 01/12/2010   Coronary artery disease involving native coronary artery of native heart without angina pectoris 01/12/2010  Hyperlipidemia 01/08/2010   Essential hypertension 01/08/2010   Home Medication(s) Prior to Admission medications   Medication Sig Start Date End Date Taking? Authorizing Provider  allopurinol (ZYLOPRIM) 100 MG tablet TAKE 1/2 TABLET EVERY OTHER DAY Patient taking differently: Take 50 mg by mouth every other day. 04/10/22  Yes Deveshwar, Janalyn Rouse, MD  amLODipine  (NORVASC) 10 MG tablet Take 1 tablet (10 mg total) by mouth daily. 12/25/22  Yes Anabel Halon, MD  apixaban (ELIQUIS) 5 MG TABS tablet Take 1 tablet (5 mg total) by mouth 2 (two) times daily. 03/12/23  Yes BranchDorothe Pea, MD  calcitRIOL (ROCALTROL) 0.25 MCG capsule Take 0.25 mcg by mouth. M, W, F   Yes [provider]  Colchicine 0.6 MG CAPS Take 0.6 mg by mouth daily as needed (Gout).   Yes [provider]  Dupilumab (DUPIXENT) 300 MG/2ML SOPN Inject 1 application into the skin every 14 (fourteen) days. 02/13/22  Yes Janalyn Harder, MD  folic acid (FOLVITE) 400 MCG tablet Take 400 mcg by mouth daily.   Yes [provider]  furosemide (LASIX) 20 MG tablet Take 20 mg by mouth 2 (two) times daily.   Yes [provider]  hydrALAZINE (APRESOLINE) 50 MG tablet Take 100 mg by mouth 2 (two) times daily. 10/14/22 10/14/23 Yes [provider]  leflunomide (ARAVA) 10 MG tablet Take 1 tablet (10 mg total) by mouth daily. 01/02/23  Yes Deveshwar, Janalyn Rouse, MD  nitroGLYCERIN (NITROSTAT) 0.4 MG SL tablet Place 0.4 mg under the tongue every 5 (five) minutes as needed for chest pain.  08/03/19  Yes [provider]  pantoprazole (PROTONIX) 40 MG tablet Take 1 tablet (40 mg total) by mouth 2 (two) times daily. 03/31/23  Yes Carlan, Chelsea L, NP  potassium chloride SA (KLOR-CON M) 20 MEQ tablet Take 20 mEq by mouth daily. 03/21/23 03/20/24 Yes [provider]  rosuvastatin (CRESTOR) 40 MG tablet TAKE 1 TABLET EVERY DAY. STOP ATORVASTATIN Patient taking differently: Take 40 mg by mouth daily. 01/21/23  Yes BranchDorothe Pea, MD  triamcinolone cream (KENALOG) 0.1 % Apply 1 Application topically daily. 02/17/23  Yes Johnson, Clanford L, MD  Alcohol Swabs (DROPSAFE ALCOHOL PREP) 70 % PADS USE AS NEEDED TO CLEAN SKIN PRIOR TO FINGERPRICK AND INJECTION 09/07/21   Elenore Paddy, NP  Blood Glucose Monitoring Suppl (ACCU-CHEK GUIDE) w/Device KIT 1 Units by Does not apply  route 2 (two) times daily. 11/23/20   Lilly Cove C, MD  glucose blood (ACCU-CHEK GUIDE) test strip Use as instructed 01/11/21   Lilly Cove C, MD  Lancets (ACCU-CHEK SOFT TOUCH) lancets Use as instructed 11/23/20   Wilson Singer, MD  sucralfate (CARAFATE) 1 GM/10ML suspension Take 10 mLs (1 g total) by mouth 4 (four) times daily -  with meals and at bedtime. Patient not taking: Reported on 04/19/2023 02/17/23   Cleora Fleet, MD  Past Surgical History Past Surgical History:  Procedure Laterality Date   BIOPSY  08/27/2019   Procedure: BIOPSY;  Surgeon: Malissa Hippo, MD;  [facility];  Service: Endoscopy;;  gastric   BIOPSY  04/03/2022   Procedure: BIOPSY;  Surgeon: Marguerita Merles, Reuel Boom, MD;  [facility];  Service: Gastroenterology;;   callus removal Left 09/13/2022   left foot   CARDIOVASCULAR STRESS TEST  2001  per Dr Dietrich Pates clinic note   distal anteroseptal ischemia,  normal LVF   COLONOSCOPY  last one 2006 (approx)   COLONOSCOPY WITH PROPOFOL N/A 04/03/2022   Procedure: COLONOSCOPY WITH PROPOFOL;  Surgeon: Dolores Frame, MD;  [facility];  Service: Gastroenterology;  Laterality: N/A;  945   CORONARY ANGIOPLASTY WITH STENT PLACEMENT  1999   DES x2  to CFX and RCA/  residual total occlusion LAD,  normal LVEF   ESOPHAGOGASTRODUODENOSCOPY (EGD) WITH PROPOFOL N/A 08/27/2019   Procedure: ESOPHAGOGASTRODUODENOSCOPY (EGD) WITH PROPOFOL;  Surgeon: Malissa Hippo, MD;  [facility];  Service: Endoscopy;  Laterality: N/A;  10:10   ESOPHAGOGASTRODUODENOSCOPY (EGD) WITH PROPOFOL N/A 04/03/2022   Procedure: ESOPHAGOGASTRODUODENOSCOPY (EGD) WITH PROPOFOL;  Surgeon: Dolores Frame, MD;  [facility];  Service: Gastroenterology;  Laterality: N/A;    ESOPHAGOGASTRODUODENOSCOPY (EGD) WITH PROPOFOL N/A 02/16/2023   Procedure: ESOPHAGOGASTRODUODENOSCOPY (EGD) WITH PROPOFOL;  Surgeon: Dolores Frame, MD;  [facility];  Service: Gastroenterology;  Laterality: N/A;   EYE SURGERY Bilateral    cataract   HOT HEMOSTASIS  04/03/2022   Procedure: HOT HEMOSTASIS (ARGON PLASMA COAGULATION/BICAP);  Surgeon: Marguerita Merles, Reuel Boom, MD;  [facility];  Service: Gastroenterology;;   POLYPECTOMY  04/03/2022   Procedure: POLYPECTOMY;  Surgeon: Dolores Frame, MD;  [facility];  Service: Gastroenterology;;   POSTERIOR CERVICAL FUSION/FORAMINOTOMY N/A 05/26/2017   Procedure: RIGHT C4-5 FORAMINOTOMY WITH EXCISION OF HERNIATED DISC;  Surgeon: Kerrin Champagne, MD;  Location: [facility] OR;  Service: Orthopedics;  Laterality: N/A;   RADIOACTIVE SEED IMPLANT N/A 01/11/2016   Procedure: RADIOACTIVE SEED IMPLANT/BRACHYTHERAPY IMPLANT;  Surgeon: Marcine Matar, MD;  Location: [facility];  Service: Urology;  Laterality: N/A;   73  seeds implanted no seeds founds in bladder   Family History Family History  Problem Relation Age of Onset   Heart attack Mother    Coronary artery disease Father    Ulcerative colitis Father    Ulcers Father    Breast cancer Sister    COPD Brother    Pancreatic cancer Brother    Healthy Sister    Diabetes Brother    Cirrhosis Son    Seizures Son     Social History Social History   Tobacco Use   Smoking status: Former    Packs/day: 1.00    Years: 30.00    Additional pack years: 0.00    Total pack years: 30.00    Types: Cigarettes    Quit date: 05/21/1989    Years since quitting: 33.9    Passive exposure: Never   Smokeless tobacco: Never  Vaping Use   Vaping Use: Never used  Substance Use Topics   Alcohol use: Not Currently    Comment: 1 beer and 1 mix drink per day   Drug use: No   Allergies Patient has no known allergies.  Review of  Systems Review of Systems  All other systems reviewed and are negative.   Physical Exam Vital Signs  I  have reviewed the triage vital signs BP 128/68   Pulse 87   Temp 98.2 F (36.8 C)   Resp 18   Ht 5\' 10"  (1.778 m)   Wt 75.3 kg   SpO2 99%   BMI 23.82 kg/m  Physical Exam Vitals and nursing note reviewed.  Constitutional:      General: He is not in acute distress.    Appearance: Normal appearance.  HENT:     Mouth/Throat:     Mouth: Mucous membranes are moist.  Eyes:     Conjunctiva/sclera: Conjunctivae normal.  Cardiovascular:     Rate and Rhythm: Normal rate and regular rhythm.  Pulmonary:     Effort: Pulmonary effort is normal. No respiratory distress.     Breath sounds: Normal breath sounds.  Abdominal:     General: Abdomen is flat.     Palpations: Abdomen is soft.     Tenderness: There is no abdominal tenderness.  Musculoskeletal:     Right lower leg: No edema.     Left lower leg: No edema.  Skin:    General: Skin is warm and dry.     Capillary Refill: Capillary refill takes less than 2 seconds.     Coloration: Skin is pale.  Neurological:     Mental Status: He is alert and oriented to person, place, and time. Mental status is at baseline.  Psychiatric:        Mood and Affect: Mood normal.        Behavior: Behavior normal.     ED Results and Treatments Labs (all labs ordered are listed, but only abnormal results are displayed) Labs Reviewed  COMPREHENSIVE METABOLIC PANEL - Abnormal; Notable for the following components:      Result Value   Sodium 134 (*)    CO2 17 (*)    Glucose, Bld 138 (*)    BUN 75 (*)    Creatinine, Ser 4.71 (*)    Calcium 8.5 (*)    Total Protein 6.2 (*)    Albumin 2.9 (*)    GFR, Estimated 12 (*)    All other components within normal limits  CBC - Abnormal; Notable for the following components:   WBC 10.8 (*)    RBC 1.72 (*)    Hemoglobin 4.9 (*)    HCT 16.1 (*)    RDW 22.0 (*)    nRBC 0.6 (*)    All other  components within normal limits  POC OCCULT BLOOD, ED - Normal  URINALYSIS, ROUTINE W REFLEX MICROSCOPIC  PREPARE RBC (CROSSMATCH)  TYPE AND SCREEN                                                                                                                          Radiology No results found.  Pertinent labs & imaging results that were available during my care of the patient were reviewed by me and considered in my medical decision making (see MDM for details).  Medications Ordered  in ED Medications  0.9 %  sodium chloride infusion (Manually program via Guardrails IV Fluids) ( Intravenous New Bag/Given 04/19/23 2114)  pantoprazole (PROTONIX) 80 mg /NS 100 mL IVPB (0 mg Intravenous Stopped 04/19/23 2022)                                                                                                                                     Procedures .Critical Care  Performed by: Lonell Grandchild, MD Authorized by: Lonell Grandchild, MD   Critical care provider statement:    Critical care time (minutes):  30   Critical care was necessary to treat or prevent imminent or life-threatening deterioration of the following conditions:  Circulatory failure (anemia, GI bleed)   Critical care was time spent personally by me on the following activities:  Development of treatment plan with patient or surrogate, discussions with consultants, evaluation of patient's response to treatment, examination of patient, ordering and review of laboratory studies, ordering and review of radiographic studies, ordering and performing treatments and interventions, pulse oximetry, re-evaluation of patient's condition and review of old charts   Care discussed with: admitting provider     (including critical care time)  Medical Decision Making / ED Course   MDM:  77 year old male presenting to the emergency department with black stools.  Labs notable for hemoglobin of 4.9.  Suspect ongoing GI bleeding.  Has  been occurring over 3 weeks and patient is hemodynamically stable and very well-appearing so we will defer reversal of anticoagulation.  No evidence of large-volume bleed.  Will transfuse.  Will give PPI infusion.  Will discuss with gastroenterology.  Labs also notable for AKI versus CKD.  Creatinine prior to 3 weeks ago was in the threes, now is 4.7.  He reports he is urinating okay.  He is mildly acidotic and his BUN is quite elevated.  His nephrologist requested renal ultrasound which will not happen tonight but hopefully over the weekend.  Will discuss with the hospitalist.  Patient consented for blood.   Clinical Course as of 04/19/23 2116  Sat Apr 19, 2023  2010 Discussed with Dr. Levon Hedger who agrees with PPI and admission. Recommends NPO after midnight will consider EGD tomorrow based on AM labs.  [WS]  2114 Discussed with Dr. Adrian Blackwater who will admit the patient.  [WS]    Clinical Course User Index [WS] Lonell Grandchild, MD     Additional history obtained: -Additional history obtained from family -External records from outside source obtained and reviewed including: Chart review including previous notes, labs, imaging, consultation notes including prior admit for similar symptoms   Lab Tests: -I ordered, reviewed, and interpreted labs.   The pertinent results include:   Labs Reviewed  COMPREHENSIVE METABOLIC PANEL - Abnormal; Notable for the following components:      Result Value   Sodium 134 (*)    CO2 17 (*)    Glucose, Bld 138 (*)  BUN 75 (*)    Creatinine, Ser 4.71 (*)    Calcium 8.5 (*)    Total Protein 6.2 (*)    Albumin 2.9 (*)    GFR, Estimated 12 (*)    All other components within normal limits  CBC - Abnormal; Notable for the following components:   WBC 10.8 (*)    RBC 1.72 (*)    Hemoglobin 4.9 (*)    HCT 16.1 (*)    RDW 22.0 (*)    nRBC 0.6 (*)    All other components within normal limits  POC OCCULT BLOOD, ED - Normal  URINALYSIS, ROUTINE W REFLEX  MICROSCOPIC  PREPARE RBC (CROSSMATCH)  TYPE AND SCREEN    Notable for anemia to 4.9, worsening creatinine   Medicines ordered and prescription drug management: Meds ordered this encounter  Medications   0.9 %  sodium chloride infusion (Manually program via Guardrails IV Fluids)   pantoprazole (PROTONIX) 80 mg /NS 100 mL IVPB    -I have reviewed the patients home medicines and have made adjustments as needed   Consultations Obtained: I requested consultation with the gastroenterologist Dr. Levon Hedger ,  and discussed lab and imaging findings as well as pertinent plan - they recommend: admission for EGD, transfuse, PPi  Social Determinants of Health:  Diagnosis or treatment significantly limited by social determinants of health: former smoker   Reevaluation: After the interventions noted above, I reevaluated the patient and found that their symptoms have improved  Co morbidities that complicate the patient evaluation  Past Medical History:  Diagnosis Date   CAD (coronary artery disease)    Acute myocardial infarction treated with TPA in 1990; 1999-stents to circumflex and RCA; residual total occlusion of the left anterior descending; normal ejection fraction; stress nuclear in 2001-distal anteroseptal ischemia; normal LV function   CKD (chronic kidney disease) stage 4, GFR 15-29 ml/min (HCC)    First degree heart block    History of gastric ulcer    History of kidney stones    History of MI (myocardial infarction)    1990-  treated w/ TPA   Hyperlipidemia    Hypertension    Jaundice    OA (osteoarthritis)    Prediabetes    Prostate cancer (HCC)    Stage T1c , Gleason 3+4,  PSA 4.7,  vol 24cc--  scheduled for radiative seed implants   Psoriatic arthritis (HCC)    RA (rheumatoid arthritis) (HCC)    Dr. Beatrix Fetters   Sinus bradycardia    Wears dentures       Dispostion: Disposition decision including need for hospitalization was considered, and patient admitted to the  hospital.    Final Clinical Impression(s) / ED Diagnoses Final diagnoses:  Acute GI bleeding  Acute kidney injury (HCC)     This chart was dictated using voice recognition software.  Despite best efforts to proofread,  errors can occur which can change the documentation meaning.    Lonell Grandchild, MD 04/19/23 2116

## 2023-04-20 ENCOUNTER — Inpatient Hospital Stay (HOSPITAL_COMMUNITY): Payer: Medicare HMO

## 2023-04-20 DIAGNOSIS — D62 Acute posthemorrhagic anemia: Secondary | ICD-10-CM | POA: Diagnosis not present

## 2023-04-20 DIAGNOSIS — K5520 Angiodysplasia of colon without hemorrhage: Secondary | ICD-10-CM

## 2023-04-20 DIAGNOSIS — Z8719 Personal history of other diseases of the digestive system: Secondary | ICD-10-CM | POA: Diagnosis not present

## 2023-04-20 LAB — CBC
HCT: 18.9 % — ABNORMAL LOW (ref 39.0–52.0)
Hemoglobin: 6.1 g/dL — CL (ref 13.0–17.0)
MCH: 28.8 pg (ref 26.0–34.0)
MCHC: 32.3 g/dL (ref 30.0–36.0)
MCV: 89.2 fL (ref 80.0–100.0)
Platelets: 204 10*3/uL (ref 150–400)
RBC: 2.12 MIL/uL — ABNORMAL LOW (ref 4.22–5.81)
RDW: 21.2 % — ABNORMAL HIGH (ref 11.5–15.5)
WBC: 10 10*3/uL (ref 4.0–10.5)
nRBC: 0.3 % — ABNORMAL HIGH (ref 0.0–0.2)

## 2023-04-20 LAB — BASIC METABOLIC PANEL
Anion gap: 10 (ref 5–15)
BUN: 74 mg/dL — ABNORMAL HIGH (ref 8–23)
CO2: 17 mmol/L — ABNORMAL LOW (ref 22–32)
Calcium: 8.1 mg/dL — ABNORMAL LOW (ref 8.9–10.3)
Chloride: 108 mmol/L (ref 98–111)
Creatinine, Ser: 4.8 mg/dL — ABNORMAL HIGH (ref 0.61–1.24)
GFR, Estimated: 12 mL/min — ABNORMAL LOW (ref >60)
Glucose, Bld: 122 mg/dL — ABNORMAL HIGH (ref 70–99)
Potassium: 3.4 mmol/L — ABNORMAL LOW (ref 3.5–5.1)
Sodium: 135 mmol/L (ref 135–145)

## 2023-04-20 LAB — HEMOGLOBIN AND HEMATOCRIT, BLOOD
HCT: 19.5 % — ABNORMAL LOW (ref 39.0–52.0)
Hemoglobin: 6.4 g/dL — CL (ref 13.0–17.0)

## 2023-04-20 LAB — BPAM RBC
ISSUE DATE / TIME: 202405042059
ISSUE DATE / TIME: 202405042331
ISSUE DATE / TIME: 202405051025
ISSUE DATE / TIME: 202405051328

## 2023-04-20 LAB — TYPE AND SCREEN: Antibody Screen: NEGATIVE

## 2023-04-20 LAB — PREPARE RBC (CROSSMATCH)

## 2023-04-20 LAB — PROTIME-INR
INR: 1.4 — ABNORMAL HIGH (ref 0.8–1.2)
Prothrombin Time: 17 seconds — ABNORMAL HIGH (ref 11.4–15.2)

## 2023-04-20 LAB — MRSA NEXT GEN BY PCR, NASAL: MRSA by PCR Next Gen: NOT DETECTED

## 2023-04-20 MED ORDER — SODIUM CHLORIDE 0.9% IV SOLUTION: Freq: Once | INTRAVENOUS | Status: AC

## 2023-04-20 MED ORDER — HYDROMORPHONE HCL 1 MG/ML IJ SOLN
0.5000 mg | INTRAMUSCULAR | Status: DC | PRN
  Administered 2023-04-20 – 2023-04-23 (×3): 0.5 mg via INTRAVENOUS
  Filled 2023-04-20 (×3): qty 0.5

## 2023-04-20 MED ORDER — ONDANSETRON HCL 4 MG/2ML IJ SOLN: 4.0000 mg | Freq: Four times a day (QID) | INTRAMUSCULAR | Status: DC | PRN

## 2023-04-20 MED ORDER — LACTATED RINGERS IV SOLN: INTRAVENOUS | Status: AC

## 2023-04-20 NOTE — TOC Initial Note (Signed)
Transition of Care [facility]) - Initial/Assessment Note    Patient Details  Name: Jacob Farmer MRN: [id number] Date of Birth: 02-22-1946  Transition [facility] Care [facility]) CM/SW Contact:    Catalina Gravel, LCSW Phone Number: 04/20/2023, 4:03 PM  Clinical Narrative:                 Pt frm home. Medical work up continuing, TOC to follow.      Barriers to Discharge: Continued Medical Work up   Patient Goals and CMS Choice            Expected Discharge Plan and Services                                              Prior Living Arrangements/Services                       Activities of Daily Living Home Assistive Devices/Equipment: Environmental consultant (specify type), Cane (specify quad or straight) ADL Screening (condition at time of admission) Patient's cognitive ability adequate to safely complete daily activities?: Yes Is the patient deaf or have difficulty hearing?: Yes Does the patient have difficulty seeing, even when wearing glasses/contacts?: No Does the patient have difficulty concentrating, remembering, or making decisions?: No Patient able to express need for assistance with ADLs?: Yes Does the patient have difficulty dressing or bathing?: No (lately started being weak since couple of weeks) Independently performs ADLs?: No Communication: Independent Dressing (OT): Needs assistance Is this a change from baseline?: Pre-admission baseline Grooming: Needs assistance Is this a change from baseline?: Pre-admission baseline Feeding: Independent Bathing: Needs assistance Is this a change from baseline?: Pre-admission baseline Toileting: Independent In/Out Bed: Independent with device (comment) Walks in Home: Independent with device (comment) Does the patient have difficulty walking or climbing stairs?: Yes Weakness of Legs: Both Weakness of Arms/Hands: None  Permission Sought/Granted                  Emotional Assessment              Admission  diagnosis:  Acute blood loss anemia [D62] Acute GI bleeding [K92.2] Acute kidney injury (HCC) [N17.9] Patient Active Problem List   Diagnosis Date Noted   Acute blood loss anemia 04/19/2023   Anemia due to stage 4 chronic kidney disease (HCC) 03/25/2023   Iron deficiency anemia 03/25/2023   Gastrointestinal hemorrhage 02/26/2023   Hospital discharge follow-up 02/26/2023   ABLA (acute blood loss anemia) 02/26/2023   Ulcer of esophagus without bleeding 02/16/2023   Upper GI bleed 02/13/2023   Acquired thrombophilia (HCC) 02/13/2023   Generalized weakness 02/13/2023   Acute bronchitis 02/13/2023   Drug-induced liver injury 02/08/2023   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   Gastritis and gastroduodenitis 02/06/2023   Bleeding from right ear 12/25/2022   Chronic heart failure with preserved ejection fraction (HCC) 07/22/2022   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   Dizziness 04/09/2022   Encounter for general adult medical examination with abnormal findings 01/25/2022   Paroxysmal atrial fibrillation (HCC) 01/22/2022   Allergic dermatitis 09/12/2021   Herniated cervical disc 05/26/2017    Class: Acute   Cervical spinal stenosis 05/26/2017   Primary osteoarthritis of both hands 02/04/2017   History of gout 02/04/2017   DDD (degenerative disc disease), lumbar 02/04/2017   High risk medication use 01/28/2017   History of prostate  cancer 01/28/2017   Chronic kidney disease, stage 4 (severe) (HCC) 12/27/2011   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   Rheumatoid arthritis (HCC) 01/04/2011   SINUS BRADYCARDIA 01/12/2010   Coronary artery disease involving native coronary artery of native heart without angina pectoris 01/12/2010   Hyperlipidemia 01/08/2010   Essential hypertension 01/08/2010   PCP:  Anabel Halon, MD Pharmacy:   [facility] [address], [place] - [address] [place] [address] [place] [address] Phone: [phone] Fax: [phone]  [facility] - [place], [place] - [address] [place] [address] Phone: [phone] Fax: [phone]     Social Determinants of Health (SDOH) Social History: SDOH Screenings   Food Insecurity: No Food Insecurity (04/19/2023)  Housing: Low Risk  (04/19/2023)  Transportation Needs: No Transportation Needs (04/19/2023)  Utilities: Not At Risk (04/19/2023)  Alcohol Screen: Low Risk  (09/14/2021)  Depression (PHQ2-9): Medium Risk (03/18/2023)  Financial Resource Strain: Low Risk  (02/18/2023)  Physical Activity: Sufficiently Active (09/14/2021)  Social Connections: Moderately Integrated (09/14/2021)  Stress: No Stress Concern Present (09/14/2021)  Tobacco Use: Medium Risk (04/19/2023)   SDOH Interventions: Housing Interventions: Intervention Not Indicated   Readmission Risk Interventions     No data to display

## 2023-04-20 NOTE — Progress Notes (Signed)
PROGRESS NOTE    Jacob Farmer  UEA:[id number] DOB: 03-23-1946 DOA: 04/19/2023 PCP: Anabel Halon, MD   Brief Narrative:  Jacob Farmer is a 77 y.o. male with medical history significant of chronic kidney disease stage IV, coronary artery disease with stents, history of colonic AVM, upper GI bleed secondary to bleeding ulcer, psoriatic arthritis, hypertension.  Patient was hospitalized in February due to bleeding ulcer which appeared to resolve after EGD and Carafate along with PPI.  Since his discharge, he has had progressive weakness.  Has noticed black stools, which she thought was due to the iron infusion he has been having.  He did get a call from his nephrologist who recommended that he come to [facility] for evaluation due to an elevated creatinine that was noticed about a month ago.  He was admitted for acute blood loss anemia secondary to upper GI bleed and has required 2 unit PRBC transfusion with need for repeat 2 unit PRBC transfusion today.  Assessment & Plan:   Principal Problem:   Acute blood loss anemia Active Problems:   Essential hypertension   Coronary artery disease involving native coronary artery of native heart without angina pectoris   Rheumatoid arthritis (HCC)   Chronic kidney disease, stage 4 (severe) (HCC)   Type 2 diabetes mellitus with diabetic chronic kidney disease (HCC)   Paroxysmal atrial fibrillation (HCC)   Chronic heart failure with preserved ejection fraction (HCC)   AVM (arteriovenous malformation) of colon   Upper GI bleed   Gastrointestinal hemorrhage  Assessment and Plan:  Acute blood loss anemia secondary to upper GI bleed/GI hemorrhage/AVM of colon Transfused 2 units, but hemoglobin still around 6 and will require another 2 units of PRBC transfusion Continue to follow-up CBC GI to see N.p.o. after midnight Will restart Carafate Protonix IV twice daily Last dose of Eliquis 5/4 AM PAF on anticoagulation Hold  anticoagulation Stage III chronic kidney disease Serum creatinine is 4.71.  About 3 weeks ago it was 4.66.  Prior to this it was 3.31. Will give IV fluids Continue to monitor, should improve with transfusion Type 2 diabetes N.p.o. Hypertension with CAD Continue home antihypertensives 6.   Mild hypokalemia  -Replete and re-evaluate    DVT prophylaxis:SCDs Code Status: Full Family Communication: Wife at bedside 5/5 Disposition Plan: Admit for GI evaluation and PRBC transfusion Status is: Inpatient Remains inpatient appropriate because: Need for IV fluid and PRBCs.  Consultants:  GI  Procedures:  None  Antimicrobials:  None   Subjective: Patient seen and evaluated today with no new acute complaints or concerns. No acute concerns or events noted overnight.  He has not had any further bowel movements since admission.  Objective: Vitals:   04/20/23 0300 04/20/23 0400 04/20/23 0500 04/20/23 0726  BP: (!) 125/51 (!) 119/52 123/62   Pulse: 85 80 82 79  Resp: 18 14 14  (!) 26  Temp:   97.8 F (36.6 C) 98 F (36.7 C)  TempSrc:   Oral Oral  SpO2: 99% 96% 96% 97%  Weight:      Height:        Intake/Output Summary (Last 24 hours) at 04/20/2023 0924 Last data filed at 04/20/2023 0914 Gross per 24 hour  Intake 1053.34 ml  Output 1000 ml  Net 53.34 ml   Filed Weights   04/19/23 1823 04/19/23 2230  Weight: 75.3 kg 73.4 kg    Examination:  General exam: Appears calm and comfortable  Respiratory system: Clear to auscultation. Respiratory effort  normal. Cardiovascular system: S1 & S2 heard, RRR.  Gastrointestinal system: Abdomen is soft Central nervous system: Alert and awake Extremities: No edema Skin: No significant lesions noted Psychiatry: Flat affect.    Data Reviewed: I have personally reviewed following labs and imaging studies  CBC: Recent Labs  Lab 04/19/23 1841 04/20/23 0322 04/20/23 0641  WBC 10.8* 10.0  --   HGB 4.9* 6.1* 6.4*  HCT 16.1* 18.9*  19.5*  MCV 93.6 89.2  --   PLT 238 204  --    Basic Metabolic Panel: Recent Labs  Lab 04/19/23 1841 04/20/23 0322  NA 134* 135  K 3.6 3.4*  CL 106 108  CO2 17* 17*  GLUCOSE 138* 122*  BUN 75* 74*  CREATININE 4.71* 4.80*  CALCIUM 8.5* 8.1*   GFR: Estimated Creatinine Clearance: 13.1 mL/min (A) (by C-G formula based on SCr of 4.8 mg/dL (H)). Liver Function Tests: Recent Labs  Lab 04/19/23 1841  AST 28  ALT 34  ALKPHOS 95  BILITOT 0.5  PROT 6.2*  ALBUMIN 2.9*   No results for input(s): "LIPASE", "AMYLASE" in the last 168 hours. No results for input(s): "AMMONIA" in the last 168 hours. Coagulation Profile: Recent Labs  Lab 04/20/23 0322  INR 1.4*   Cardiac Enzymes: No results for input(s): "CKTOTAL", "CKMB", "CKMBINDEX", "TROPONINI" in the last 168 hours. BNP (last 3 results) No results for input(s): "PROBNP" in the last 8760 hours. HbA1C: No results for input(s): "HGBA1C" in the last 72 hours. CBG: No results for input(s): "GLUCAP" in the last 168 hours. Lipid Profile: No results for input(s): "CHOL", "HDL", "LDLCALC", "TRIG", "CHOLHDL", "LDLDIRECT" in the last 72 hours. Thyroid Function Tests: No results for input(s): "TSH", "T4TOTAL", "FREET4", "T3FREE", "THYROIDAB" in the last 72 hours. Anemia Panel: No results for input(s): "VITAMINB12", "FOLATE", "FERRITIN", "TIBC", "IRON", "RETICCTPCT" in the last 72 hours. Sepsis Labs: No results for input(s): "PROCALCITON", "LATICACIDVEN" in the last 168 hours.  Recent Results (from the past 240 hour(s))  MRSA Next Gen by PCR, Nasal     Status: None   Collection Time: 04/19/23 11:00 PM   Specimen: Nasal Mucosa; Nasal Swab  Result Value Ref Range Status   MRSA by PCR Next Gen NOT DETECTED NOT DETECTED Final    Comment: (NOTE) The GeneXpert MRSA Assay (FDA approved for NASAL specimens only), is one component of a comprehensive MRSA colonization surveillance program. It is not intended to diagnose MRSA infection nor  to guide or monitor treatment for MRSA infections. Test performance is not FDA approved in patients less than 37 years old. Performed at [facility], [address] [place], [place] [address]          Radiology Studies: No results found.      Scheduled Meds:  amLODipine  10 mg Oral Daily   Chlorhexidine Gluconate Cloth  6 each Topical Q0600   hydrALAZINE  100 mg Oral BID   leflunomide  10 mg Oral Daily   pantoprazole (PROTONIX) IV  40 mg Intravenous Q12H   potassium chloride SA  20 mEq Oral Daily   rosuvastatin  40 mg Oral Daily   sucralfate  1 g Oral TID WC & HS   Continuous Infusions:  lactated ringers       LOS: 1 day    Time spent: 35 minutes    Nehan Flaum Hoover Brunette, DO [facility]  If 7PM-7AM, please contact night-coverage [email or web address] 04/20/2023, 9:24 AM

## 2023-04-20 NOTE — Consult Note (Signed)
Jaylyn Booher Farmer, M.D. Gastroenterology & Hepatology                                           Patient Name: Jacob Farmer Account #: @[id number]@   MRN: [id number] Admission Date: 04/19/2023 Date of Evaluation:  04/20/2023 Time of Evaluation: 10:14 AM   Referring Physician: Pratik Shah, DO  Chief Complaint: Melena and iron deficiency anemia.  HPI:  This is a 76 y.o. 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  Patient reports that since mid April he has presented recurrent episodes of melena along with feeling lightheaded and fatigued.  Denies any hematochezia, abdominal pain, vomiting, abdominal distention, fever or chills.  He has been taking his Eliquis compliantly and took his last dose yesterday morning.  Denies any NSAID use.  He has been taking his PPI compliantly twice a day.  Notably, the patient was admitted 02/13/23 with a 2-week history of progressive fatigue and melena noted to be significantly anemic presenting hemoglobin of 7.6 Hemoccult positive on admission. Underwent EGD which showed a 3 cm esophageal ulcer, gastritis/duodenitis, started on PPI BID and carafate 1g QID (x1month), advised to repeat EGD in 6 weeks.  He was seen in clinic on 03/31/2023 and had his EGD scheduled for 05/01/2023.  In the ED, he was HD stable and afebrile. Labs were remarkable for severe anemia with hemoglobin of 4.9, MCV 93, WBC 10.8, platelets 238, CMP with creatinine 4.71, BUN 75, sodium 134, CO2 17, normal liver enzymes.  Patient was transfused 2 units of PRBC but repeat hemoglobin today was 6.4.  Currently receiving 1 more unit of blood.  Past Medical History: SEE CHRONIC ISSSUES: Past Medical History:  Diagnosis  Date   CAD (coronary artery disease)    Acute myocardial infarction treated with TPA in 1990; 1999-stents to circumflex and RCA; residual total occlusion of the left anterior descending; normal ejection fraction; stress nuclear in 2001-distal anteroseptal ischemia; normal LV function   CKD (chronic kidney disease) stage 4, GFR 15-29 ml/min (HCC)    First degree heart block    History of gastric ulcer    History of kidney stones    History of MI (myocardial infarction)    1990-  treated w/ TPA   Hyperlipidemia    Hypertension    Jaundice    OA (osteoarthritis)    Prediabetes    Prostate cancer (HCC)    Stage T1c , Gleason 3+4,  PSA 4.7,  vol 24cc--  scheduled for radiative seed implants   Psoriatic arthritis (HCC)    RA (rheumatoid arthritis) (HCC)    Dr. Devewshar   Sinus bradycardia    Wears dentures    Past Surgical History:  Past Surgical History:  Procedure Laterality Date   BIOPSY  08/27/2019   Procedure: BIOPSY;  Surgeon: Rehman, Najeeb U, MD;  [facility]: [facility];  Service: Endoscopy;;  gastric   BIOPSY  04/03/2022   Procedure: BIOPSY;  Surgeon: Farmer Mayorga, Ahsan Esterline, MD;  Location: [facility];  Service: Gastroenterology;;   callus removal Left 09/13/2022   left foot   CARDIOVASCULAR STRESS TEST  2001  per Dr Rothbart   clinic note   distal anteroseptal ischemia,  normal LVF   COLONOSCOPY  last one 2006 (approx)   COLONOSCOPY WITH PROPOFOL N/A 04/03/2022   Procedure: COLONOSCOPY WITH PROPOFOL;  Surgeon: Farmer Mayorga, Juwuan Sedita, MD;  Location: [facility];  Service: Gastroenterology;  Laterality: N/A;  945   CORONARY ANGIOPLASTY WITH STENT PLACEMENT  1999   DES x2  to CFX and RCA/  residual total occlusion LAD,  normal LVEF   ESOPHAGOGASTRODUODENOSCOPY (EGD) WITH PROPOFOL N/A 08/27/2019   Procedure: ESOPHAGOGASTRODUODENOSCOPY (EGD) WITH PROPOFOL;  Surgeon: Rehman, Najeeb U, MD;  [facility]: [facility];  Service: Endoscopy;  Laterality: N/A;  10:10    ESOPHAGOGASTRODUODENOSCOPY (EGD) WITH PROPOFOL N/A 04/03/2022   Procedure: ESOPHAGOGASTRODUODENOSCOPY (EGD) WITH PROPOFOL;  Surgeon: Farmer Mayorga, Stewart Pimenta, MD;  [facility]: [facility];  Service: Gastroenterology;  Laterality: N/A;   ESOPHAGOGASTRODUODENOSCOPY (EGD) WITH PROPOFOL N/A 02/16/2023   Procedure: ESOPHAGOGASTRODUODENOSCOPY (EGD) WITH PROPOFOL;  Surgeon: Farmer Mayorga, Belkys Henault, MD;  [facility]: [facility];  Service: Gastroenterology;  Laterality: N/A;   EYE SURGERY Bilateral    cataract   HOT HEMOSTASIS  04/03/2022   Procedure: HOT HEMOSTASIS (ARGON PLASMA COAGULATION/BICAP);  Surgeon: Farmer Mayorga, Alleah Dearman, MD;  Location: [facility];  Service: Gastroenterology;;   POLYPECTOMY  04/03/2022   Procedure: POLYPECTOMY;  Surgeon: Farmer Mayorga, Aldous Housel, MD;  Location: [facility];  Service: Gastroenterology;;   POSTERIOR CERVICAL FUSION/FORAMINOTOMY N/A 05/26/2017   Procedure: RIGHT C4-5 FORAMINOTOMY WITH EXCISION OF HERNIATED DISC;  Surgeon: Nitka, James E, MD;  Location: [facility];  Service: Orthopedics;  Laterality: N/A;   RADIOACTIVE SEED IMPLANT N/A 01/11/2016   Procedure: RADIOACTIVE SEED IMPLANT/BRACHYTHERAPY IMPLANT;  Surgeon: Stephen Dahlstedt, MD;  Location: [facility];  Service: Urology;  Laterality: N/A;   73  seeds implanted no seeds founds in bladder   Family History:  Family History  Problem Relation Age of Onset   Heart attack Mother    Coronary artery disease Father    Ulcerative colitis Father    Ulcers Father    Breast cancer Sister    COPD Brother    Pancreatic cancer Brother    Healthy Sister    Diabetes Brother    Cirrhosis Son    Seizures Son    Social History:  Social History   Tobacco Use   Smoking status: Former    Packs/day: 1.00    Years: 30.00    Additional pack years: 0.00    Total pack years: 30.00    Types: Cigarettes    Quit date: 05/21/1989    Years since quitting: 33.9    Passive exposure: Never   Smokeless  tobacco: Never  Vaping Use   Vaping Use: Never used  Substance Use Topics   Alcohol use: Not Currently    Comment: 1 beer and 1 mix drink per day   Drug use: No    Home Medications:  Prior to Admission medications   Medication Sig Start Date End Date Taking? Authorizing Provider  allopurinol (ZYLOPRIM) 100 MG tablet TAKE 1/2 TABLET EVERY OTHER DAY Patient taking differently: Take 50 mg by mouth every other day. 04/10/22  Yes Deveshwar, Shaili, MD  amLODipine (NORVASC) 10 MG tablet Take 1 tablet (10 mg total) by mouth daily. 12/25/22  Yes Patel, Rutwik K, MD  apixaban (ELIQUIS) 5 MG TABS tablet Take 1 tablet (5 mg total) by mouth 2 (two) times daily. 03/12/23  Yes Branch, Jonathan F, MD  calcitRIOL (ROCALTROL) 0.25 MCG capsule Take 0.25 mcg by mouth.   M, W, F   Yes [provider]  Colchicine 0.6 MG CAPS Take 0.6 mg by mouth daily as needed (Gout).   Yes [provider]  Dupilumab (DUPIXENT) 300 MG/2ML SOPN Inject 1 application into the skin every 14 (fourteen) days. 02/13/22  Yes Tafeen, Stuart, MD  folic acid (FOLVITE) 400 MCG tablet Take 400 mcg by mouth daily.   Yes [provider]  furosemide (LASIX) 20 MG tablet Take 20 mg by mouth 2 (two) times daily.   Yes [provider]  hydrALAZINE (APRESOLINE) 50 MG tablet Take 100 mg by mouth 2 (two) times daily. 10/14/22 10/14/23 Yes [provider]  leflunomide (ARAVA) 10 MG tablet Take 1 tablet (10 mg total) by mouth daily. 01/02/23  Yes Deveshwar, Shaili, MD  nitroGLYCERIN (NITROSTAT) 0.4 MG SL tablet Place 0.4 mg under the tongue every 5 (five) minutes as needed for chest pain.  08/03/19  Yes [provider]  pantoprazole (PROTONIX) 40 MG tablet Take 1 tablet (40 mg total) by mouth 2 (two) times daily. 03/31/23  Yes Carlan, Chelsea L, NP  potassium chloride SA (KLOR-CON M) 20 MEQ tablet Take 20 mEq by mouth daily. 03/21/23 03/20/24 Yes [provider]  rosuvastatin (CRESTOR) 40 MG tablet  TAKE 1 TABLET EVERY DAY. STOP ATORVASTATIN Patient taking differently: Take 40 mg by mouth daily. 01/21/23  Yes Branch, Jonathan F, MD  triamcinolone cream (KENALOG) 0.1 % Apply 1 Application topically daily. 02/17/23  Yes Johnson, Clanford L, MD  Alcohol Swabs (DROPSAFE ALCOHOL PREP) 70 % PADS USE AS NEEDED TO CLEAN SKIN PRIOR TO FINGERPRICK AND INJECTION 09/07/21   Gray, Sarah E, NP  Blood Glucose Monitoring Suppl (ACCU-CHEK GUIDE) w/Device KIT 1 Units by Does not apply route 2 (two) times daily. 11/23/20   Gosrani, Nimish C, MD  glucose blood (ACCU-CHEK GUIDE) test strip Use as instructed 01/11/21   Gosrani, Nimish C, MD  Lancets (ACCU-CHEK SOFT TOUCH) lancets Use as instructed 11/23/20   Gosrani, Nimish C, MD  sucralfate (CARAFATE) 1 GM/10ML suspension Take 10 mLs (1 g total) by mouth 4 (four) times daily -  with meals and at bedtime. Patient not taking: Reported on 04/19/2023 02/17/23   Johnson, Clanford L, MD    Inpatient Medications:  Current Facility-Administered Medications:    amLODipine (NORVASC) tablet 10 mg, 10 mg, Oral, Daily, Stinson, Jacob J, DO, 10 mg at 04/20/23 1002   Chlorhexidine Gluconate Cloth 2 % PADS 6 each, 6 each, Topical, Q0600, Stinson, Jacob J, DO, 6 each at 04/20/23 0537   hydrALAZINE (APRESOLINE) tablet 100 mg, 100 mg, Oral, BID, Stinson, Jacob J, DO, 100 mg at 04/20/23 1002   HYDROmorphone (DILAUDID) injection 0.5 mg, 0.5 mg, Intravenous, Q3H PRN, Shah, Pratik D, DO   lactated ringers infusion, , Intravenous, Continuous, Shah, Pratik D, DO, Last Rate: 75 mL/hr at 04/20/23 1009, New Bag at 04/20/23 1009   leflunomide (ARAVA) tablet 10 mg, 10 mg, Oral, Daily, Stinson, Jacob J, DO, 10 mg at 04/20/23 1003   ondansetron (ZOFRAN) injection 4 mg, 4 mg, Intravenous, Q6H PRN, Shah, Pratik D, DO   pantoprazole (PROTONIX) injection 40 mg, 40 mg, Intravenous, Q12H, Stinson, Jacob J, DO, 40 mg at 04/20/23 1002   potassium chloride SA (KLOR-CON M) CR tablet 20 mEq, 20 mEq, Oral, Daily,  Stinson, Jacob J, DO, 20 mEq at 04/20/23 1002   rosuvastatin (CRESTOR) tablet 40 mg, 40 mg, Oral, Daily, Stinson, Jacob J, DO, 40 mg at 04/20/23 1003   sucralfate (CARAFATE) 1   GM/10ML suspension 1 g, 1 g, Oral, TID WC & HS, Stinson, Jacob J, DO, 1 g at 04/20/23 0812 Allergies: Patient has no known allergies.  Complete Review of Systems: GENERAL: negative for malaise, night sweats HEENT: No changes in hearing or vision, no nose bleeds or other nasal problems. NECK: Negative for lumps, goiter, pain and significant neck swelling RESPIRATORY: Negative for cough, wheezing CARDIOVASCULAR: Negative for chest pain, leg swelling, palpitations, orthopnea GI: SEE HPI MUSCULOSKELETAL: Negative for joint pain or swelling, back pain, and muscle pain. SKIN: Negative for lesions, rash PSYCH: Negative for sleep disturbance, mood disorder and recent psychosocial stressors. HEMATOLOGY Negative for prolonged bleeding, bruising easily, and swollen nodes. ENDOCRINE: Negative for cold or heat intolerance, polyuria, polydipsia and goiter. NEURO: negative for tremor, gait imbalance, syncope and seizures. The remainder of the review of systems is noncontributory.  Physical Exam: BP 123/62   Pulse 79   Temp 98 F (36.7 C) (Oral)   Resp (!) 26   Ht 5' 10" (1.778 m)   Wt 73.4 kg   SpO2 97%   BMI 23.22 kg/m  GENERAL: The patient is AO x3, in no acute distress. HEENT: Head is normocephalic and atraumatic. EOMI are intact. Mouth is well hydrated and without lesions. NECK: Supple. No masses LUNGS: Clear to auscultation. No presence of rhonchi/wheezing/rales. Adequate chest expansion HEART: RRR, normal s1 and s2. ABDOMEN: Soft, nontender, no guarding, no peritoneal signs, and nondistended. BS +. No masses. EXTREMITIES: Without any cyanosis, clubbing, rash, lesions or edema. NEUROLOGIC: AOx3, no focal motor deficit. SKIN: no jaundice, no rashes  Laboratory Data CBC:     Component Value Date/Time   WBC 10.0  04/20/2023 0322   RBC 2.12 (L) 04/20/2023 0322   HGB 6.4 (LL) 04/20/2023 0641   HGB 10.1 (L) 02/26/2023 1339   HCT 19.5 (L) 04/20/2023 0641   HCT 32.6 (L) 02/26/2023 1339   PLT 204 04/20/2023 0322   PLT 171 02/26/2023 1339   MCV 89.2 04/20/2023 0322   MCV 89 02/26/2023 1339   MCH 28.8 04/20/2023 0322   MCHC 32.3 04/20/2023 0322   RDW 21.2 (H) 04/20/2023 0322   RDW 13.8 02/26/2023 1339   LYMPHSABS 646 (L) 03/28/2023 1340   LYMPHSABS 0.8 02/26/2023 1339   MONOABS 0.7 11/02/2020 1154   EOSABS 221 03/28/2023 1340   EOSABS 0.2 02/26/2023 1339   BASOSABS 43 03/28/2023 1340   BASOSABS 0.0 02/26/2023 1339   COAG:  Lab Results  Component Value Date   INR 1.4 (H) 04/20/2023   INR 1.0 11/13/2020   INR 1.04 05/26/2017    BMP:     Latest Ref Rng & Units 04/20/2023    3:22 AM 04/19/2023    6:41 PM 03/28/2023    1:40 PM  BMP  Glucose 70 - 99 mg/dL 122  138  122   BUN 8 - 23 mg/dL 74  75  32   Creatinine 0.61 - 1.24 mg/dL 4.80  4.71  4.66   BUN/Creat Ratio 6 - 22 (calc)   7   Sodium 135 - 145 mmol/L 135  134  138   Potassium 3.5 - 5.1 mmol/L 3.4  3.6  3.8   Chloride 98 - 111 mmol/L 108  106  101   CO2 22 - 32 mmol/L 17  17  22   Calcium 8.9 - 10.3 mg/dL 8.1  8.5  9.3     HEPATIC:     Latest Ref Rng & Units 04/19/2023    6:41 PM   03/28/2023    1:40 PM 02/16/2023    5:03 AM  Hepatic Function  Total Protein 6.5 - 8.1 g/dL 6.2  7.0    Albumin 3.5 - 5.0 g/dL 2.9   3.2   AST 15 - 41 U/L 28  24    ALT 0 - 44 U/L 34  14    Alk Phosphatase 38 - 126 U/L 95     Total Bilirubin 0.3 - 1.2 mg/dL 0.5  0.4      CARDIAC:  Lab Results  Component Value Date   CKTOTAL 43 (L) 11/13/2020     Imaging: I personally reviewed and interpreted the available imaging.  Assessment & Plan: Jacob Farmer is a 76 y.o. 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  Patient is presenting symptoms concerning for recurrent upper GI bleeding requiring recurrent blood transfusion.  He had a large esophageal ulcer during his last endoscopy.  I discussed with the patient and family that it would be important to evaluate this further but he needs to be optimized first from an intravascular perspective -needs to be transfused with a goal hemoglobin of 8.  Will continue PPI twice daily for now.  May proceed with an EGD tomorrow if hemoglobin is stable.  This will also allow washout of Eliquis as he has history of CKD.  - Repeat CBC qday, transfuse if Hb <8 - Pantoprazole 40 mg q12h IVP - 2 large bore IV lines - Active T/S - Keep NPO after MN - Hold Eliquis - Avoid NSAIDs - Will proceed with possible EGD tomorrow  Jacob Warzecha Castaneda, MD Gastroenterology and Hepatology [facility]  

## 2023-04-20 NOTE — Progress Notes (Signed)
Critical hemoglobin was taken of 6.4 messaged Dr. Sherryll Burger.

## 2023-04-20 NOTE — H&P (View-Only) (Signed)
[name], M.D. Gastroenterology & Hepatology                                           Patient Name: Jacob Farmer Account #: @FLAACCTNO @   MRN: [id number] Admission Date: 04/19/2023 Date of Evaluation:  04/20/2023 Time of Evaluation: 10:14 AM   Referring Physician: Maurilio Lovely, DO  Chief Complaint: Melena and iron deficiency anemia.  HPI:  This is a 77 y.o. 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  Patient reports that since mid April he has presented recurrent episodes of melena along with feeling lightheaded and fatigued.  Denies any hematochezia, abdominal pain, vomiting, abdominal distention, fever or chills.  He has been taking his Eliquis compliantly and took his last dose yesterday morning.  Denies any NSAID use.  He has been taking his PPI compliantly twice a day.  Notably, the patient was admitted 02/13/23 with a 2-week history of progressive fatigue and melena noted to be significantly anemic presenting hemoglobin of 7.6 Hemoccult positive on admission. Underwent EGD which showed a 3 cm esophageal ulcer, gastritis/duodenitis, started on PPI BID and carafate 1g QID (x71month), advised to repeat EGD in 6 weeks.  He was seen in clinic on 03/31/2023 and had his EGD scheduled for 05/01/2023.  In the ED, he was HD stable and afebrile. Labs were remarkable for severe anemia with hemoglobin of 4.9, MCV 93, WBC 10.8, platelets 238, CMP with creatinine 4.71, BUN 75, sodium 134, CO2 17, normal liver enzymes.  Patient was transfused 2 units of PRBC but repeat hemoglobin today was 6.4.  Currently receiving 1 more unit of blood.  Past Medical History: SEE CHRONIC ISSSUES: Past Medical History:  Diagnosis  Date   CAD (coronary artery disease)    Acute myocardial infarction treated with TPA in 1990; 1999-stents to circumflex and RCA; residual total occlusion of the left anterior descending; normal ejection fraction; stress nuclear in 2001-distal anteroseptal ischemia; normal LV function   CKD (chronic kidney disease) stage 4, GFR 15-29 ml/min (HCC)    First degree heart block    History of gastric ulcer    History of kidney stones    History of MI (myocardial infarction)    1990-  treated w/ TPA   Hyperlipidemia    Hypertension    Jaundice    OA (osteoarthritis)    Prediabetes    Prostate cancer (HCC)    Stage T1c , Gleason 3+4,  PSA 4.7,  vol 24cc--  scheduled for radiative seed implants   Psoriatic arthritis (HCC)    RA (rheumatoid arthritis) (HCC)    Dr. Beatrix Fetters   Sinus bradycardia    Wears dentures    Past Surgical History:  Past Surgical History:  Procedure Laterality Date   BIOPSY  08/27/2019   Procedure: BIOPSY;  Surgeon: Malissa Hippo, MD;  [facility];  Service: Endoscopy;;  gastric   BIOPSY  04/03/2022   Procedure: BIOPSY;  Surgeon: Dolores Frame, MD;  [facility]: [facility];  Service: Gastroenterology;;   callus removal Left 09/13/2022   left foot   CARDIOVASCULAR STRESS TEST  2001  per Dr Dietrich Pates  clinic note   distal anteroseptal ischemia,  normal LVF   COLONOSCOPY  last one 2006 (approx)   COLONOSCOPY WITH PROPOFOL N/A 04/03/2022   Procedure: COLONOSCOPY WITH PROPOFOL;  Surgeon: Dolores Frame, MD;  [facility]: [facility];  Service: Gastroenterology;  Laterality: N/A;  945   CORONARY ANGIOPLASTY WITH STENT PLACEMENT  1999   DES x2  to CFX and RCA/  residual total occlusion LAD,  normal LVEF   ESOPHAGOGASTRODUODENOSCOPY (EGD) WITH PROPOFOL N/A 08/27/2019   Procedure: ESOPHAGOGASTRODUODENOSCOPY (EGD) WITH PROPOFOL;  Surgeon: Malissa Hippo, MD;  [facility]: [facility];  Service: Endoscopy;  Laterality: N/A;  10:10    ESOPHAGOGASTRODUODENOSCOPY (EGD) WITH PROPOFOL N/A 04/03/2022   Procedure: ESOPHAGOGASTRODUODENOSCOPY (EGD) WITH PROPOFOL;  Surgeon: Dolores Frame, MD;  [facility];  Service: Gastroenterology;  Laterality: N/A;   ESOPHAGOGASTRODUODENOSCOPY (EGD) WITH PROPOFOL N/A 02/16/2023   Procedure: ESOPHAGOGASTRODUODENOSCOPY (EGD) WITH PROPOFOL;  Surgeon: Dolores Frame, MD;  [facility];  Service: Gastroenterology;  Laterality: N/A;   EYE SURGERY Bilateral    cataract   HOT HEMOSTASIS  04/03/2022   Procedure: HOT HEMOSTASIS (ARGON PLASMA COAGULATION/BICAP);  Surgeon: Marguerita Merles, Reuel Boom, MD;  [facility]: [facility];  Service: Gastroenterology;;   POLYPECTOMY  04/03/2022   Procedure: POLYPECTOMY;  Surgeon: Dolores Frame, MD;  [facility];  Service: Gastroenterology;;   POSTERIOR CERVICAL FUSION/FORAMINOTOMY N/A 05/26/2017   Procedure: RIGHT C4-5 FORAMINOTOMY WITH EXCISION OF HERNIATED DISC;  Surgeon: Kerrin Champagne, MD;  Location: [facility];  Service: Orthopedics;  Laterality: N/A;   RADIOACTIVE SEED IMPLANT N/A 01/11/2016   Procedure: RADIOACTIVE SEED IMPLANT/BRACHYTHERAPY IMPLANT;  Surgeon: Marcine Matar, MD;  Location: [facility];  Service: Urology;  Laterality: N/A;   71  seeds implanted no seeds founds in bladder   Family History:  Family History  Problem Relation Age of Onset   Heart attack Mother    Coronary artery disease Father    Ulcerative colitis Father    Ulcers Father    Breast cancer Sister    COPD Brother    Pancreatic cancer Brother    Healthy Sister    Diabetes Brother    Cirrhosis Son    Seizures Son    Social History:  Social History   Tobacco Use   Smoking status: Former    Packs/day: 1.00    Years: 30.00    Additional pack years: 0.00    Total pack years: 30.00    Types: Cigarettes    Quit date: 05/21/1989    Years since quitting: 33.9    Passive exposure: Never   Smokeless  tobacco: Never  Vaping Use   Vaping Use: Never used  Substance Use Topics   Alcohol use: Not Currently    Comment: 1 beer and 1 mix drink per day   Drug use: No    Home Medications:  Prior to Admission medications   Medication Sig Start Date End Date Taking? Authorizing Provider  allopurinol (ZYLOPRIM) 100 MG tablet TAKE 1/2 TABLET EVERY OTHER DAY Patient taking differently: Take 50 mg by mouth every other day. 04/10/22  Yes Deveshwar, Janalyn Rouse, MD  amLODipine (NORVASC) 10 MG tablet Take 1 tablet (10 mg total) by mouth daily. 12/25/22  Yes Anabel Halon, MD  apixaban (ELIQUIS) 5 MG TABS tablet Take 1 tablet (5 mg total) by mouth 2 (two) times daily. 03/12/23  Yes BranchDorothe Pea, MD  calcitRIOL (ROCALTROL) 0.25 MCG capsule Take 0.25 mcg by mouth.  M, W, F   Yes [provider]  Colchicine 0.6 MG CAPS Take 0.6 mg by mouth daily as needed (Gout).   Yes [provider]  Dupilumab (DUPIXENT) 300 MG/2ML SOPN Inject 1 application into the skin every 14 (fourteen) days. 02/13/22  Yes Janalyn Harder, MD  folic acid (FOLVITE) 400 MCG tablet Take 400 mcg by mouth daily.   Yes [provider]  furosemide (LASIX) 20 MG tablet Take 20 mg by mouth 2 (two) times daily.   Yes [provider]  hydrALAZINE (APRESOLINE) 50 MG tablet Take 100 mg by mouth 2 (two) times daily. 10/14/22 10/14/23 Yes [provider]  leflunomide (ARAVA) 10 MG tablet Take 1 tablet (10 mg total) by mouth daily. 01/02/23  Yes Deveshwar, Janalyn Rouse, MD  nitroGLYCERIN (NITROSTAT) 0.4 MG SL tablet Place 0.4 mg under the tongue every 5 (five) minutes as needed for chest pain.  08/03/19  Yes [provider]  pantoprazole (PROTONIX) 40 MG tablet Take 1 tablet (40 mg total) by mouth 2 (two) times daily. 03/31/23  Yes Carlan, Chelsea L, NP  potassium chloride SA (KLOR-CON M) 20 MEQ tablet Take 20 mEq by mouth daily. 03/21/23 03/20/24 Yes [provider]  rosuvastatin (CRESTOR) 40 MG tablet  TAKE 1 TABLET EVERY DAY. STOP ATORVASTATIN Patient taking differently: Take 40 mg by mouth daily. 01/21/23  Yes BranchDorothe Pea, MD  triamcinolone cream (KENALOG) 0.1 % Apply 1 Application topically daily. 02/17/23  Yes Johnson, Clanford L, MD  Alcohol Swabs (DROPSAFE ALCOHOL PREP) 70 % PADS USE AS NEEDED TO CLEAN SKIN PRIOR TO FINGERPRICK AND INJECTION 09/07/21   Elenore Paddy, NP  Blood Glucose Monitoring Suppl (ACCU-CHEK GUIDE) w/Device KIT 1 Units by Does not apply route 2 (two) times daily. 11/23/20   Lilly Cove C, MD  glucose blood (ACCU-CHEK GUIDE) test strip Use as instructed 01/11/21   Lilly Cove C, MD  Lancets (ACCU-CHEK SOFT TOUCH) lancets Use as instructed 11/23/20   Wilson Singer, MD  sucralfate (CARAFATE) 1 GM/10ML suspension Take 10 mLs (1 g total) by mouth 4 (four) times daily -  with meals and at bedtime. Patient not taking: Reported on 04/19/2023 02/17/23   Cleora Fleet, MD    Inpatient Medications:  Current Facility-Administered Medications:    amLODipine (NORVASC) tablet 10 mg, 10 mg, Oral, Daily, Levie Heritage, DO, 10 mg at 04/20/23 1002   Chlorhexidine Gluconate Cloth 2 % PADS 6 each, 6 each, Topical, Q0600, Levie Heritage, DO, 6 each at 04/20/23 8295   hydrALAZINE (APRESOLINE) tablet 100 mg, 100 mg, Oral, BID, Levie Heritage, DO, 100 mg at 04/20/23 1002   HYDROmorphone (DILAUDID) injection 0.5 mg, 0.5 mg, Intravenous, Q3H PRN, Sherryll Burger, Pratik D, DO   lactated ringers infusion, , Intravenous, Continuous, Sherryll Burger, Pratik D, DO, Last Rate: 75 mL/hr at 04/20/23 1009, New Bag at 04/20/23 1009   leflunomide (ARAVA) tablet 10 mg, 10 mg, Oral, Daily, Levie Heritage, DO, 10 mg at 04/20/23 1003   ondansetron (ZOFRAN) injection 4 mg, 4 mg, Intravenous, Q6H PRN, Sherryll Burger, Pratik D, DO   pantoprazole (PROTONIX) injection 40 mg, 40 mg, Intravenous, Q12H, Levie Heritage, DO, 40 mg at 04/20/23 1002   potassium chloride SA (KLOR-CON M) CR tablet 20 mEq, 20 mEq, Oral, Daily,  Levie Heritage, DO, 20 mEq at 04/20/23 1002   rosuvastatin (CRESTOR) tablet 40 mg, 40 mg, Oral, Daily, Levie Heritage, DO, 40 mg at 04/20/23 1003   sucralfate (CARAFATE) 1  GM/10ML suspension 1 g, 1 g, Oral, TID WC & HS, Levie Heritage, DO, 1 g at 04/20/23 6045 Allergies: Patient has no known allergies.  Complete Review of Systems: GENERAL: negative for malaise, night sweats HEENT: No changes in hearing or vision, no nose bleeds or other nasal problems. NECK: Negative for lumps, goiter, pain and significant neck swelling RESPIRATORY: Negative for cough, wheezing CARDIOVASCULAR: Negative for chest pain, leg swelling, palpitations, orthopnea GI: SEE HPI MUSCULOSKELETAL: Negative for joint pain or swelling, back pain, and muscle pain. SKIN: Negative for lesions, rash PSYCH: Negative for sleep disturbance, mood disorder and recent psychosocial stressors. HEMATOLOGY Negative for prolonged bleeding, bruising easily, and swollen nodes. ENDOCRINE: Negative for cold or heat intolerance, polyuria, polydipsia and goiter. NEURO: negative for tremor, gait imbalance, syncope and seizures. The remainder of the review of systems is noncontributory.  Physical Exam: BP 123/62   Pulse 79   Temp 98 F (36.7 C) (Oral)   Resp (!) 26   Ht 5\' 10"  (1.778 m)   Wt 73.4 kg   SpO2 97%   BMI 23.22 kg/m  GENERAL: The patient is AO x3, in no acute distress. HEENT: Head is normocephalic and atraumatic. EOMI are intact. Mouth is well hydrated and without lesions. NECK: Supple. No masses LUNGS: Clear to auscultation. No presence of rhonchi/wheezing/rales. Adequate chest expansion HEART: RRR, normal s1 and s2. ABDOMEN: Soft, nontender, no guarding, no peritoneal signs, and nondistended. BS +. No masses. EXTREMITIES: Without any cyanosis, clubbing, rash, lesions or edema. NEUROLOGIC: AOx3, no focal motor deficit. SKIN: no jaundice, no rashes  Laboratory Data CBC:     Component Value Date/Time   WBC 10.0  04/20/2023 0322   RBC 2.12 (L) 04/20/2023 0322   HGB 6.4 (LL) 04/20/2023 0641   HGB 10.1 (L) 02/26/2023 1339   HCT 19.5 (L) 04/20/2023 0641   HCT 32.6 (L) 02/26/2023 1339   PLT 204 04/20/2023 0322   PLT 171 02/26/2023 1339   MCV 89.2 04/20/2023 0322   MCV 89 02/26/2023 1339   MCH 28.8 04/20/2023 0322   MCHC 32.3 04/20/2023 0322   RDW 21.2 (H) 04/20/2023 0322   RDW 13.8 02/26/2023 1339   LYMPHSABS 646 (L) 03/28/2023 1340   LYMPHSABS 0.8 02/26/2023 1339   MONOABS 0.7 11/02/2020 1154   EOSABS 221 03/28/2023 1340   EOSABS 0.2 02/26/2023 1339   BASOSABS 43 03/28/2023 1340   BASOSABS 0.0 02/26/2023 1339   COAG:  Lab Results  Component Value Date   INR 1.4 (H) 04/20/2023   INR 1.0 11/13/2020   INR 1.04 05/26/2017    BMP:     Latest Ref Rng & Units 04/20/2023    3:22 AM 04/19/2023    6:41 PM 03/28/2023    1:40 PM  BMP  Glucose 70 - 99 mg/dL 409  811  914   BUN 8 - 23 mg/dL 74  75  32   Creatinine 0.61 - 1.24 mg/dL 7.82  9.56  2.13   BUN/Creat Ratio 6 - 22 (calc)   7   Sodium 135 - 145 mmol/L 135  134  138   Potassium 3.5 - 5.1 mmol/L 3.4  3.6  3.8   Chloride 98 - 111 mmol/L 108  106  101   CO2 22 - 32 mmol/L 17  17  22    Calcium 8.9 - 10.3 mg/dL 8.1  8.5  9.3     HEPATIC:     Latest Ref Rng & Units 04/19/2023    6:41 PM  03/28/2023    1:40 PM 02/16/2023    5:03 AM  Hepatic Function  Total Protein 6.5 - 8.1 g/dL 6.2  7.0    Albumin 3.5 - 5.0 g/dL 2.9   3.2   AST 15 - 41 U/L 28  24    ALT 0 - 44 U/L 34  14    Alk Phosphatase 38 - 126 U/L 95     Total Bilirubin 0.3 - 1.2 mg/dL 0.5  0.4      CARDIAC:  Lab Results  Component Value Date   CKTOTAL 43 (L) 11/13/2020     Imaging: I personally reviewed and interpreted the available imaging.  Assessment & Plan: Jacob Farmer is a 77 y.o. 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  Patient is presenting symptoms concerning for recurrent upper GI bleeding requiring recurrent blood transfusion.  He had a large esophageal ulcer during his last endoscopy.  I discussed with the patient and family that it would be important to evaluate this further but he needs to be optimized first from an intravascular perspective -needs to be transfused with a goal hemoglobin of 8.  Will continue PPI twice daily for now.  May proceed with an EGD tomorrow if hemoglobin is stable.  This will also allow washout of Eliquis as he has history of CKD.  - Repeat CBC qday, transfuse if Hb <8 - Pantoprazole 40 mg q12h IVP - 2 large bore IV lines - Active T/S - Keep NPO after MN - Hold Eliquis - Avoid NSAIDs - Will proceed with possible EGD tomorrow  [name], MD Gastroenterology and Hepatology [facility] Gastroenterology

## 2023-04-21 ENCOUNTER — Encounter (HOSPITAL_COMMUNITY): Payer: Self-pay | Admitting: Family Medicine

## 2023-04-21 ENCOUNTER — Inpatient Hospital Stay (HOSPITAL_COMMUNITY): Payer: Medicare HMO | Admitting: Anesthesiology

## 2023-04-21 ENCOUNTER — Ambulatory Visit: Payer: Medicare HMO | Admitting: Cardiology

## 2023-04-21 ENCOUNTER — Encounter (HOSPITAL_COMMUNITY)
Admission: EM | Disposition: A | Discharge status: Home / Self Care | Payer: Self-pay | Source: Home / Self Care | Attending: Internal Medicine

## 2023-04-21 DIAGNOSIS — K2970 Gastritis, unspecified, without bleeding: Secondary | ICD-10-CM

## 2023-04-21 DIAGNOSIS — I10 Essential (primary) hypertension: Secondary | ICD-10-CM

## 2023-04-21 DIAGNOSIS — K2991 Gastroduodenitis, unspecified, with bleeding: Secondary | ICD-10-CM | POA: Diagnosis not present

## 2023-04-21 DIAGNOSIS — K3189 Other diseases of stomach and duodenum: Secondary | ICD-10-CM | POA: Diagnosis not present

## 2023-04-21 DIAGNOSIS — D62 Acute posthemorrhagic anemia: Secondary | ICD-10-CM | POA: Diagnosis not present

## 2023-04-21 DIAGNOSIS — I2510 Atherosclerotic heart disease of native coronary artery without angina pectoris: Secondary | ICD-10-CM

## 2023-04-21 DIAGNOSIS — Z87891 Personal history of nicotine dependence: Secondary | ICD-10-CM | POA: Diagnosis not present

## 2023-04-21 HISTORY — PX: BIOPSY: SHX5522

## 2023-04-21 HISTORY — PX: ESOPHAGOGASTRODUODENOSCOPY (EGD) WITH PROPOFOL: SHX5813

## 2023-04-21 LAB — CBC
HCT: 24 % — ABNORMAL LOW (ref 39.0–52.0)
Hemoglobin: 7.8 g/dL — ABNORMAL LOW (ref 13.0–17.0)
MCH: 30 pg (ref 26.0–34.0)
MCHC: 32.5 g/dL (ref 30.0–36.0)
MCV: 92.3 fL (ref 80.0–100.0)
Platelets: 171 10*3/uL (ref 150–400)
RBC: 2.6 MIL/uL — ABNORMAL LOW (ref 4.22–5.81)
RDW: 19.9 % — ABNORMAL HIGH (ref 11.5–15.5)
WBC: 9.4 10*3/uL (ref 4.0–10.5)
nRBC: 0.2 % (ref 0.0–0.2)

## 2023-04-21 LAB — BASIC METABOLIC PANEL
Anion gap: 10 (ref 5–15)
BUN: 60 mg/dL — ABNORMAL HIGH (ref 8–23)
CO2: 16 mmol/L — ABNORMAL LOW (ref 22–32)
Calcium: 7.7 mg/dL — ABNORMAL LOW (ref 8.9–10.3)
Chloride: 109 mmol/L (ref 98–111)
Creatinine, Ser: 3.86 mg/dL — ABNORMAL HIGH (ref 0.61–1.24)
GFR, Estimated: 15 mL/min — ABNORMAL LOW (ref >60)
Glucose, Bld: 92 mg/dL (ref 70–99)
Potassium: 3.3 mmol/L — ABNORMAL LOW (ref 3.5–5.1)
Sodium: 135 mmol/L (ref 135–145)

## 2023-04-21 LAB — TYPE AND SCREEN
ABO/RH(D): O POS
Unit division: 0
Unit division: 0
Unit division: 0

## 2023-04-21 LAB — BPAM RBC
Blood Product Expiration Date: 202406082359
Blood Product Expiration Date: 202406082359
Unit Type and Rh: 5100
Unit Type and Rh: 5100
Unit Type and Rh: 5100
Unit Type and Rh: 5100

## 2023-04-21 LAB — MAGNESIUM: Magnesium: 1.7 mg/dL (ref 1.7–2.4)

## 2023-04-21 SURGERY — ESOPHAGOGASTRODUODENOSCOPY (EGD) WITH PROPOFOL
Anesthesia: General

## 2023-04-21 MED ORDER — POTASSIUM CHLORIDE CRYS ER 20 MEQ PO TBCR
40.0000 meq | EXTENDED_RELEASE_TABLET | Freq: Once | ORAL | Status: AC
  Administered 2023-04-21: 40 meq via ORAL
  Filled 2023-04-21: qty 2

## 2023-04-21 MED ORDER — LACTATED RINGERS IV SOLN: INTRAVENOUS | Status: DC | PRN

## 2023-04-21 MED ORDER — PROPOFOL 10 MG/ML IV BOLUS
INTRAVENOUS | Status: DC | PRN
  Administered 2023-04-21: 70 mg via INTRAVENOUS
  Administered 2023-04-21: 40 mg via INTRAVENOUS

## 2023-04-21 MED ORDER — SODIUM CHLORIDE 0.9 % IV SOLN: INTRAVENOUS | Status: DC

## 2023-04-21 MED ORDER — LIDOCAINE HCL (CARDIAC) PF 100 MG/5ML IV SOSY
PREFILLED_SYRINGE | INTRAVENOUS | Status: DC | PRN
  Administered 2023-04-21: 50 mg via INTRAVENOUS

## 2023-04-21 MED ORDER — POLYVINYL ALCOHOL 1.4 % OP SOLN
1.0000 [drp] | OPHTHALMIC | Status: DC | PRN
  Filled 2023-04-21: qty 15

## 2023-04-21 NOTE — Op Note (Addendum)
[facility] Patient Name: Jacob Farmer Procedure Date: 04/21/2023 2:39 PM MRN: [id number] Date of Birth: Oct 24, 1946 Attending MD: Hennie Duos. Marletta Lor , [place], [id number] CSN: [id number] Age: 77 Admit Type: Inpatient Procedure:                Upper GI endoscopy Indications:              Acute post hemorrhagic anemia Providers:                Hennie Duos. Marletta Lor, DO, Sheran Fava, Hetty Ely, Technician Referring MD:              Medicines:                See the Anesthesia note for documentation of the                            administered medications Complications:            No immediate complications. Estimated Blood Loss:     Estimated blood loss was minimal. Procedure:                Pre-Anesthesia Assessment:                           - The anesthesia plan was to use monitored                            anesthesia care (MAC).                           After obtaining informed consent, the endoscope was                            passed under direct vision. Throughout the                            procedure, the patient's blood pressure, pulse, and                            oxygen saturations were monitored continuously. The                            [id number]-[id number] ([id number]) scope was introduced through the                            mouth, and advanced to the second part of duodenum.                            The upper GI endoscopy was accomplished without                            difficulty. The patient tolerated the procedure                            well. Scope  In: 2:53:49 PM Scope Out: 2:59:47 PM Total Procedure Duration: 0 hours 5 minutes 58 seconds  Findings:      The examined esophagus was normal. Previously noted ulcer has healed.      Diffuse moderate inflammation characterized by congestion (edema) and       erythema was found in the entire examined stomach. Biopsies were taken       with a cold forceps for  histology.      Patchy mildly erythematous mucosa without active bleeding and with no       stigmata of bleeding was found in the duodenal bulb, in the first       portion of the duodenum and in the second portion of the duodenum. Impression:               - Normal esophagus.                           - Gastritis. Biopsied.                           - Erythematous duodenopathy. Moderate Sedation:      Per Anesthesia Care Recommendation:           - Return patient to hospital ward for ongoing care.                           - Will plan on inpatient capsule endoscopy                            tomorrow. May need colonoscopy given history of                            cecal AVMs prior                           - Resume regular diet. NPO after midnight. Procedure Code(s):        --- Professional ---                           [phone], Esophagogastroduodenoscopy, flexible,                            transoral; with biopsy, single or multiple Diagnosis Code(s):        --- Professional ---                           K29.70, Gastritis, unspecified, without bleeding                           K31.89, Other diseases of stomach and duodenum                           D62, Acute posthemorrhagic anemia CPT copyright 2022 American Medical Association. All rights reserved. The codes documented in this report are preliminary and upon coder review may  be revised to meet current compliance requirements. Hennie Duos. Marletta Lor, DO Hennie Duos. Marletta Lor, DO 04/21/2023 3:10:53 PM This report has been signed electronically. Number of Addenda: 0

## 2023-04-21 NOTE — Interval H&P Note (Signed)
History and Physical Interval Note:  04/21/2023 1:58 PM  Jacob Farmer  has presented today for surgery, with the diagnosis of melena, history of esophageal ulcer.  The various methods of treatment have been discussed with the patient and family. After consideration of risks, benefits and other options for treatment, the patient has consented to  Procedure(s): ESOPHAGOGASTRODUODENOSCOPY (EGD) WITH PROPOFOL (N/A) as a surgical intervention.  The patient's history has been reviewed, patient examined, no change in status, stable for surgery.  I have reviewed the patient's chart and labs.  Questions were answered to the patient's satisfaction.     Lanelle Bal

## 2023-04-21 NOTE — Transfer of Care (Signed)
Immediate Anesthesia Transfer of Care Note  Patient: Jacob Farmer  Procedure(s) Performed: ESOPHAGOGASTRODUODENOSCOPY (EGD) WITH PROPOFOL BIOPSY  Patient Location: PACU  Anesthesia Type:General  Level of Consciousness: awake and patient cooperative  Airway & Oxygen Therapy: Patient Spontanous Breathing  Post-op Assessment: Report given to RN and Post -op Vital signs reviewed and stable  Post vital signs: Reviewed and stable  Last Vitals:  Vitals Value Taken Time  BP 107/55 1508  Temp 97.6 1508  Pulse 68 1508  Resp 18 1508  SpO2 96 1508    Last Pain:  Vitals:   04/21/23 1446  TempSrc:   PainSc: 0-No pain         Complications: No notable events documented.

## 2023-04-21 NOTE — Progress Notes (Signed)
PROGRESS NOTE    Jacob Farmer  RUE:[id number] DOB: 01/07/1946 DOA: 04/19/2023 PCP: Anabel Halon, MD   Brief Narrative:  Jacob Farmer is a 77 y.o. male with medical history significant of chronic kidney disease stage IV, coronary artery disease with stents, history of colonic AVM, upper GI bleed secondary to bleeding ulcer, psoriatic arthritis, hypertension.  Patient was hospitalized in February due to bleeding ulcer which appeared to resolve after EGD and Carafate along with PPI.  Since his discharge, he has had progressive weakness.  Has noticed black stools, which she thought was due to the iron infusion he has been having.  He did get a call from his nephrologist who recommended that he come to [facility] for evaluation due to an elevated creatinine that was noticed about a month ago.  He was admitted for acute blood loss anemia secondary to upper GI bleed and has required 2 unit PRBC transfusion with need for repeat 2 unit PRBC transfusion and has received total 4 units thus far.  Plans are for EGD today.  Assessment & Plan:   Principal Problem:   Acute blood loss anemia Active Problems:   Essential hypertension   Coronary artery disease involving native coronary artery of native heart without angina pectoris   Rheumatoid arthritis (HCC)   Chronic kidney disease, stage 4 (severe) (HCC)   Type 2 diabetes mellitus with diabetic chronic kidney disease (HCC)   Paroxysmal atrial fibrillation (HCC)   Chronic heart failure with preserved ejection fraction (HCC)   AVM (arteriovenous malformation) of colon   Upper GI bleed   Gastrointestinal hemorrhage  Assessment and Plan:  Acute blood loss anemia secondary to upper GI bleed/GI hemorrhage/AVM of colon Transfused 2 units, but hemoglobin still around 6 and will require another 2 units of PRBC transfusion Continue to follow-up CBC GI planning for endoscopy today N.p.o. after midnight Will restart Carafate Protonix IV twice  daily Last dose of Eliquis 5/4 AM PAF on anticoagulation Hold anticoagulation Stage III chronic kidney disease Serum creatinine is 4.71.  About 3 weeks ago it was 4.66.  Prior to this it was 3.31. Will give IV fluids Continue to monitor, should improve with transfusion Type 2 diabetes N.p.o. Hypertension with CAD Continue home antihypertensives 6.   Mild hypokalemia  -Replete and re-evaluate    DVT prophylaxis:SCDs Code Status: Full Family Communication: Wife at bedside 5/6 Disposition Plan: Admit for GI evaluation and PRBC transfusion Status is: Inpatient Remains inpatient appropriate because: Need for IV fluid and PRBCs.  Consultants:  GI  Procedures:  None  Antimicrobials:  None   Subjective: Patient seen and evaluated today with no new acute complaints or concerns. No acute concerns or events noted overnight.  He has not had any further bowel movements since admission.  Objective: Vitals:   04/21/23 0700 04/21/23 0751 04/21/23 0800 04/21/23 1117  BP:   (!) 149/71   Pulse: 77  79   Resp: 17  20   Temp:  97.7 F (36.5 C)  98 F (36.7 C)  TempSrc:  Oral  Oral  SpO2: 97%  96%   Weight:      Height:        Intake/Output Summary (Last 24 hours) at 04/21/2023 1231 Last data filed at 04/21/2023 0500 Gross per 24 hour  Intake 1531.36 ml  Output 750 ml  Net 781.36 ml   Filed Weights   04/19/23 1823 04/19/23 2230 04/21/23 0545  Weight: 75.3 kg 73.4 kg 76.6 kg  Examination:  General exam: Appears calm and comfortable  Respiratory system: Clear to auscultation. Respiratory effort normal. Cardiovascular system: S1 & S2 heard, RRR.  Gastrointestinal system: Abdomen is soft Central nervous system: Alert and awake Extremities: No edema Skin: No significant lesions noted Psychiatry: Flat affect.    Data Reviewed: I have personally reviewed following labs and imaging studies  CBC: Recent Labs  Lab 04/19/23 1841 04/20/23 0322 04/20/23 0641  04/21/23 0434  WBC 10.8* 10.0  --  9.4  HGB 4.9* 6.1* 6.4* 7.8*  HCT 16.1* 18.9* 19.5* 24.0*  MCV 93.6 89.2  --  92.3  PLT 238 204  --  171   Basic Metabolic Panel: Recent Labs  Lab 04/19/23 1841 04/20/23 0322 04/21/23 0434  NA 134* 135 135  K 3.6 3.4* 3.3*  CL 106 108 109  CO2 17* 17* 16*  GLUCOSE 138* 122* 92  BUN 75* 74* 60*  CREATININE 4.71* 4.80* 3.86*  CALCIUM 8.5* 8.1* 7.7*  MG  --   --  1.7   GFR: Estimated Creatinine Clearance: 16.3 mL/min (A) (by C-G formula based on SCr of 3.86 mg/dL (H)). Liver Function Tests: Recent Labs  Lab 04/19/23 1841  AST 28  ALT 34  ALKPHOS 95  BILITOT 0.5  PROT 6.2*  ALBUMIN 2.9*   No results for input(s): "LIPASE", "AMYLASE" in the last 168 hours. No results for input(s): "AMMONIA" in the last 168 hours. Coagulation Profile: Recent Labs  Lab 04/20/23 0322  INR 1.4*   Cardiac Enzymes: No results for input(s): "CKTOTAL", "CKMB", "CKMBINDEX", "TROPONINI" in the last 168 hours. BNP (last 3 results) No results for input(s): "PROBNP" in the last 8760 hours. HbA1C: No results for input(s): "HGBA1C" in the last 72 hours. CBG: No results for input(s): "GLUCAP" in the last 168 hours. Lipid Profile: No results for input(s): "CHOL", "HDL", "LDLCALC", "TRIG", "CHOLHDL", "LDLDIRECT" in the last 72 hours. Thyroid Function Tests: No results for input(s): "TSH", "T4TOTAL", "FREET4", "T3FREE", "THYROIDAB" in the last 72 hours. Anemia Panel: No results for input(s): "VITAMINB12", "FOLATE", "FERRITIN", "TIBC", "IRON", "RETICCTPCT" in the last 72 hours. Sepsis Labs: No results for input(s): "PROCALCITON", "LATICACIDVEN" in the last 168 hours.  Recent Results (from the past 240 hour(s))  MRSA Next Gen by PCR, Nasal     Status: None   Collection Time: 04/19/23 11:00 PM   Specimen: Nasal Mucosa; Nasal Swab  Result Value Ref Range Status   MRSA by PCR Next Gen NOT DETECTED NOT DETECTED Final    Comment: (NOTE) The GeneXpert MRSA Assay  (FDA approved for NASAL specimens only), is one component of a comprehensive MRSA colonization surveillance program. It is not intended to diagnose MRSA infection nor to guide or monitor treatment for MRSA infections. Test performance is not FDA approved in patients less than 23 years old. Performed at [facility], [address], [place], [place] [address]          Radiology Studies: US Renal  Result Date: 04/20/2023 CLINICAL DATA:  Acute kidney injury. EXAM: RENAL / URINARY TRACT ULTRASOUND COMPLETE COMPARISON:  Abdomen ultrasound, 11/02/2020. FINDINGS: Right Kidney: Renal measurements: 9.7 x 4.7 x 5.6 cm = volume: 133.2 mL. Echogenicity within normal limits. No mass or hydronephrosis visualized. Left Kidney: Renal measurements: 7.5 x 3.9 x 3.3 cm = volume: 50.3 mL. Increased parenchymal echogenicity. Diffuse cortical thinning and lobulation. No mass, stone or hydronephrosis. Bladder: Appears normal for degree of bladder distention. Other: None. IMPRESSION: 1. No acute findings.  No hydronephrosis. 2. Small left  kidney with increased parenchymal echogenicity and cortical thinning, consistent with medical renal disease, mildly decreased in size compared to the prior ultrasound from 2021. Electronically Signed   By: Amie Portland M.D.   On: 04/20/2023 10:56        Scheduled Meds:  amLODipine  10 mg Oral Daily   Chlorhexidine Gluconate Cloth  6 each Topical Q0600   hydrALAZINE  100 mg Oral BID   leflunomide  10 mg Oral Daily   pantoprazole (PROTONIX) IV  40 mg Intravenous Q12H   rosuvastatin  40 mg Oral Daily   sucralfate  1 g Oral TID WC & HS      LOS: 2 days    Time spent: 35 minutes    Davidjames Blansett Hoover Brunette, DO [facility]  If 7PM-7AM, please contact night-coverage [email or web address] 04/21/2023, 12:31 PM

## 2023-04-21 NOTE — Anesthesia Preprocedure Evaluation (Signed)
Anesthesia Evaluation  Patient identified by MRN, date of birth, ID band Patient awake    Reviewed: Allergy & Precautions, H&P , NPO status , Patient's Chart, lab work & pertinent test results, reviewed documented beta blocker date and time   Airway Mallampati: II  TM Distance: >3 FB Neck ROM: full    Dental no notable dental hx. (+) Partial Lower, Partial Upper   Pulmonary neg pulmonary ROS, former smoker   Pulmonary exam normal breath sounds clear to auscultation       Cardiovascular Exercise Tolerance: Good hypertension, + CAD  negative cardio ROS + dysrhythmias  Rhythm:regular Rate:Normal     Neuro/Psych negative neurological ROS  negative psych ROS   GI/Hepatic negative GI ROS, Neg liver ROS, PUD,,,  Endo/Other  negative endocrine ROSdiabetes    Renal/GU Renal diseasenegative Renal ROS  negative genitourinary   Musculoskeletal   Abdominal   Peds  Hematology negative hematology ROS (+) Blood dyscrasia, anemia   Anesthesia Other Findings   Reproductive/Obstetrics negative OB ROS                             Anesthesia Physical Anesthesia Plan  ASA: 4 and emergent  Anesthesia Plan: General   Post-op Pain Management:    Induction:   PONV Risk Score and Plan: Propofol infusion  Airway Management Planned:   Additional Equipment:   Intra-op Plan:   Post-operative Plan:   Informed Consent: I have reviewed the patients History and Physical, chart, labs and discussed the procedure including the risks, benefits and alternatives for the proposed anesthesia with the patient or authorized representative who has indicated his/her understanding and acceptance.     Dental Advisory Given  Plan Discussed with: CRNA  Anesthesia Plan Comments:        Anesthesia Quick Evaluation

## 2023-04-21 NOTE — Anesthesia Procedure Notes (Signed)
Date/Time: 04/21/2023 2:45 PM  Performed by: Franco Nones, CRNAPre-anesthesia Checklist: Patient identified, Emergency Drugs available, Suction available, Timeout performed and Patient being monitored Patient Re-evaluated:Patient Re-evaluated prior to induction Oxygen Delivery Method: Nasal Cannula

## 2023-04-22 ENCOUNTER — Encounter (HOSPITAL_COMMUNITY)
Admission: EM | Disposition: A | Discharge status: Home / Self Care | Payer: Self-pay | Source: Home / Self Care | Attending: Internal Medicine

## 2023-04-22 DIAGNOSIS — K921 Melena: Secondary | ICD-10-CM

## 2023-04-22 DIAGNOSIS — D62 Acute posthemorrhagic anemia: Secondary | ICD-10-CM | POA: Diagnosis not present

## 2023-04-22 HISTORY — PX: GIVENS CAPSULE STUDY: SHX5432

## 2023-04-22 LAB — BASIC METABOLIC PANEL
Anion gap: 9 (ref 5–15)
BUN: 64 mg/dL — ABNORMAL HIGH (ref 8–23)
CO2: 18 mmol/L — ABNORMAL LOW (ref 22–32)
Calcium: 8.1 mg/dL — ABNORMAL LOW (ref 8.9–10.3)
Chloride: 110 mmol/L (ref 98–111)
Creatinine, Ser: 4.47 mg/dL — ABNORMAL HIGH (ref 0.61–1.24)
GFR, Estimated: 13 mL/min — ABNORMAL LOW (ref >60)
Glucose, Bld: 106 mg/dL — ABNORMAL HIGH (ref 70–99)
Potassium: 3.6 mmol/L (ref 3.5–5.1)
Sodium: 137 mmol/L (ref 135–145)

## 2023-04-22 LAB — CBC
HCT: 27 % — ABNORMAL LOW (ref 39.0–52.0)
Hemoglobin: 8.9 g/dL — ABNORMAL LOW (ref 13.0–17.0)
MCH: 30.9 pg (ref 26.0–34.0)
MCHC: 33 g/dL (ref 30.0–36.0)
MCV: 93.8 fL (ref 80.0–100.0)
Platelets: 177 10*3/uL (ref 150–400)
RBC: 2.88 MIL/uL — ABNORMAL LOW (ref 4.22–5.81)
RDW: 21 % — ABNORMAL HIGH (ref 11.5–15.5)
WBC: 9.6 10*3/uL (ref 4.0–10.5)
nRBC: 0 % (ref 0.0–0.2)

## 2023-04-22 LAB — MAGNESIUM: Magnesium: 1.9 mg/dL (ref 1.7–2.4)

## 2023-04-22 SURGERY — IMAGING PROCEDURE, GI TRACT, INTRALUMINAL, VIA CAPSULE
Anesthesia: Monitor Anesthesia Care

## 2023-04-22 MED ORDER — LACTATED RINGERS IV SOLN: INTRAVENOUS | Status: AC

## 2023-04-22 NOTE — Progress Notes (Signed)
PROGRESS NOTE    GEORGY Farmer  XBM:[id number] DOB: 1946-07-04 DOA: 04/19/2023 PCP: Anabel Halon, MD   Brief Narrative:  Jacob Farmer is a 77 y.o. male with medical history significant of chronic kidney disease stage IV, coronary artery disease with stents, history of colonic AVM, upper GI bleed secondary to bleeding ulcer, psoriatic arthritis, hypertension.  Patient was hospitalized in February due to bleeding ulcer which appeared to resolve after EGD and Carafate along with PPI.  Since his discharge, he has had progressive weakness.  Has noticed black stools, which she thought was due to the iron infusion he has been having.  He did get a call from his nephrologist who recommended that he come to [facility] for evaluation due to an elevated creatinine that was noticed about a month ago.  He was admitted for acute blood loss anemia secondary to upper GI bleed and has required 2 unit PRBC transfusion with need for repeat 2 unit PRBC transfusion and has received total 4 units thus far.  Patient underwent EGD 5/6 with no acute findings and is now undergoing capsule endoscopy today.  Assessment & Plan:   Principal Problem:   Acute blood loss anemia Active Problems:   Essential hypertension   Coronary artery disease involving native coronary artery of native heart without angina pectoris   Rheumatoid arthritis (HCC)   Chronic kidney disease, stage 4 (severe) (HCC)   Type 2 diabetes mellitus with diabetic chronic kidney disease (HCC)   Paroxysmal atrial fibrillation (HCC)   Chronic heart failure with preserved ejection fraction (HCC)   AVM (arteriovenous malformation) of colon   Upper GI bleed   Acute GI bleeding  Assessment and Plan:  Acute blood loss anemia secondary to upper GI bleed/GI hemorrhage/AVM of colon Transfused total 4 units since admission and now stable hemoglobin levels Continue to follow-up CBC EGD 5/6 with gastritis and no other acute findings.  Plans for  Givens capsule today. Diet per GI Will restart Carafate Protonix IV twice daily Last dose of Eliquis 5/4 AM PAF on anticoagulation Hold anticoagulation Stage III chronic kidney disease Serum creatinine is 4.71.  About 3 weeks ago it was 4.66.  Prior to this it was 3.31. Will give IV fluids Continue to monitor, will need outpatient nephrology follow-up Type 2 diabetes Diet per GI Hypertension with CAD Continue home antihypertensives    DVT prophylaxis:SCDs Code Status: Full Family Communication: Wife at bedside 5/7 Disposition Plan: Admit for GI evaluation and PRBC transfusion Status is: Inpatient Remains inpatient appropriate because: Need for IV fluid and PRBCs.  Consultants:  GI  Procedures:  EGD 5/6 Capsule endoscopy 5/7  Antimicrobials:  None   Subjective: Patient seen and evaluated today with no new acute complaints or concerns. No acute concerns or events noted overnight.  His last BM was yesterday a.m. and is unsure if there is any blood loss.  Capsule placed this a.m.  Objective: Vitals:   04/22/23 0100 04/22/23 0200 04/22/23 0300 04/22/23 0400  BP: (!) 145/74 135/78 (!) 168/67 132/63  Pulse: 75 74 81 88  Resp: 15 20 (!) 24 (!) 23  Temp:    98.1 F (36.7 C)  TempSrc:    Oral  SpO2: 96% 97% 96% 96%  Weight:      Height:        Intake/Output Summary (Last 24 hours) at 04/22/2023 1006 Last data filed at 04/22/2023 0000 Gross per 24 hour  Intake 600 ml  Output 500 ml  Net 100  ml   Filed Weights   04/19/23 2230 04/21/23 0545 04/21/23 1345  Weight: 73.4 kg 76.6 kg 77 kg    Examination:  General exam: Appears calm and comfortable  Respiratory system: Clear to auscultation. Respiratory effort normal. Cardiovascular system: S1 & S2 heard, RRR.  Gastrointestinal system: Abdomen is soft Central nervous system: Alert and awake Extremities: No edema Skin: No significant lesions noted Psychiatry: Flat affect.    Data Reviewed: I have personally  reviewed following labs and imaging studies  CBC: Recent Labs  Lab 04/19/23 1841 04/20/23 0322 04/20/23 0641 04/21/23 0434 04/22/23 0512  WBC 10.8* 10.0  --  9.4 9.6  HGB 4.9* 6.1* 6.4* 7.8* 8.9*  HCT 16.1* 18.9* 19.5* 24.0* 27.0*  MCV 93.6 89.2  --  92.3 93.8  PLT 238 204  --  171 177   Basic Metabolic Panel: Recent Labs  Lab 04/19/23 1841 04/20/23 0322 04/21/23 0434 04/22/23 0512  NA 134* 135 135 137  K 3.6 3.4* 3.3* 3.6  CL 106 108 109 110  CO2 17* 17* 16* 18*  GLUCOSE 138* 122* 92 106*  BUN 75* 74* 60* 64*  CREATININE 4.71* 4.80* 3.86* 4.47*  CALCIUM 8.5* 8.1* 7.7* 8.1*  MG  --   --  1.7 1.9   GFR: Estimated Creatinine Clearance: 14.1 mL/min (A) (by C-G formula based on SCr of 4.47 mg/dL (H)). Liver Function Tests: Recent Labs  Lab 04/19/23 1841  AST 28  ALT 34  ALKPHOS 95  BILITOT 0.5  PROT 6.2*  ALBUMIN 2.9*   No results for input(s): "LIPASE", "AMYLASE" in the last 168 hours. No results for input(s): "AMMONIA" in the last 168 hours. Coagulation Profile: Recent Labs  Lab 04/20/23 0322  INR 1.4*   Cardiac Enzymes: No results for input(s): "CKTOTAL", "CKMB", "CKMBINDEX", "TROPONINI" in the last 168 hours. BNP (last 3 results) No results for input(s): "PROBNP" in the last 8760 hours. HbA1C: No results for input(s): "HGBA1C" in the last 72 hours. CBG: No results for input(s): "GLUCAP" in the last 168 hours. Lipid Profile: No results for input(s): "CHOL", "HDL", "LDLCALC", "TRIG", "CHOLHDL", "LDLDIRECT" in the last 72 hours. Thyroid Function Tests: No results for input(s): "TSH", "T4TOTAL", "FREET4", "T3FREE", "THYROIDAB" in the last 72 hours. Anemia Panel: No results for input(s): "VITAMINB12", "FOLATE", "FERRITIN", "TIBC", "IRON", "RETICCTPCT" in the last 72 hours. Sepsis Labs: No results for input(s): "PROCALCITON", "LATICACIDVEN" in the last 168 hours.  Recent Results (from the past 240 hour(s))  MRSA Next Gen by PCR, Nasal     Status: None    Collection Time: 04/19/23 11:00 PM   Specimen: Nasal Mucosa; Nasal Swab  Result Value Ref Range Status   MRSA by PCR Next Gen NOT DETECTED NOT DETECTED Final    Comment: (NOTE) The GeneXpert MRSA Assay (FDA approved for NASAL specimens only), is one component of a comprehensive MRSA colonization surveillance program. It is not intended to diagnose MRSA infection nor to guide or monitor treatment for MRSA infections. Test performance is not FDA approved in patients less than 27 years old. Performed at [facility], [address] [place], [place] [address]          Radiology Studies: US Renal  Result Date: 04/20/2023 CLINICAL DATA:  Acute kidney injury. EXAM: RENAL / URINARY TRACT ULTRASOUND COMPLETE COMPARISON:  Abdomen ultrasound, 11/02/2020. FINDINGS: Right Kidney: Renal measurements: 9.7 x 4.7 x 5.6 cm = volume: 133.2 mL. Echogenicity within normal limits. No mass or hydronephrosis visualized. Left Kidney: Renal measurements: 7.5 x  3.9 x 3.3 cm = volume: 50.3 mL. Increased parenchymal echogenicity. Diffuse cortical thinning and lobulation. No mass, stone or hydronephrosis. Bladder: Appears normal for degree of bladder distention. Other: None. IMPRESSION: 1. No acute findings.  No hydronephrosis. 2. Small left kidney with increased parenchymal echogenicity and cortical thinning, consistent with medical renal disease, mildly decreased in size compared to the prior ultrasound from 2021. Electronically Signed   By: Amie Portland M.D.   On: 04/20/2023 10:56        Scheduled Meds:  [MAR Hold] amLODipine  10 mg Oral Daily   [MAR Hold] Chlorhexidine Gluconate Cloth  6 each Topical Q0600   [MAR Hold] hydrALAZINE  100 mg Oral BID   [MAR Hold] leflunomide  10 mg Oral Daily   [MAR Hold] pantoprazole (PROTONIX) IV  40 mg Intravenous Q12H   [MAR Hold] rosuvastatin  40 mg Oral Daily   [MAR Hold] sucralfate  1 g Oral TID WC & HS      LOS: 3 days    Time spent: 35 minutes    Demetry Bendickson Hoover Brunette, DO [facility]  If 7PM-7AM, please contact night-coverage [email or web address] 04/22/2023, 10:06 AM

## 2023-04-22 NOTE — Anesthesia Postprocedure Evaluation (Signed)
Anesthesia Post Note  Patient: Jacob Farmer  Procedure(s) Performed: ESOPHAGOGASTRODUODENOSCOPY (EGD) WITH PROPOFOL BIOPSY  Patient location during evaluation: Phase II Anesthesia Type: General Level of consciousness: awake Pain management: pain level controlled Vital Signs Assessment: post-procedure vital signs reviewed and stable Respiratory status: spontaneous breathing and respiratory function stable Cardiovascular status: blood pressure returned to baseline and stable Postop Assessment: no headache and no apparent nausea or vomiting Anesthetic complications: no Comments: Late entry   No notable events documented.   Last Vitals:  Vitals:   04/22/23 0300 04/22/23 0400  BP: (!) 168/67 132/63  Pulse: 81 88  Resp: (!) 24 (!) 23  Temp:  36.7 C  SpO2: 96% 96%    Last Pain:  Vitals:   04/22/23 0400  TempSrc: Oral  PainSc:                  Windell Norfolk

## 2023-04-22 NOTE — Progress Notes (Signed)
Subjective: Reports he is feeling well. Last BM was yesterday morning. Not sure if it was black or not. Confirmed with nursing staff, no melena or brbpr.   No abdominal pain. Little nausea this morning, but no vomiting, and nausea has now resolved.   Capsule placed around 9 am.   Objective: Vital signs in last 24 hours: Temp:  [97.6 F (36.4 C)-98.6 F (37 C)] 98.1 F (36.7 C) (05/07 0400) Pulse Rate:  [37-88] 88 (05/07 0400) Resp:  [14-28] 23 (05/07 0400) BP: (107-168)/(55-79) 132/63 (05/07 0400) SpO2:  [94 %-97 %] 96 % (05/07 0400) Weight:  [77 kg] 77 kg (05/06 1345) Last BM Date : 04/21/23 General:   Alert and oriented, pleasant, NAD Head:  Normocephalic and atraumatic. Abdomen:  Bowel sounds present, soft, non-tender, non-distended. Capsule recorder in place.  Neurologic:  Alert and  oriented x4;  grossly normal neurologically. Psych:  Normal mood and affect.  Intake/Output from previous day: 05/06 0701 - 05/07 0700 In: 600 [P.O.:100; I.V.:500] Out: 500 [Urine:500] Intake/Output this shift: No intake/output data recorded.  Lab Results: Recent Labs    04/20/23 0322 04/20/23 0641 04/21/23 0434 04/22/23 0512  WBC 10.0  --  9.4 9.6  HGB 6.1* 6.4* 7.8* 8.9*  HCT 18.9* 19.5* 24.0* 27.0*  PLT 204  --  171 177   BMET Recent Labs    04/20/23 0322 04/21/23 0434 04/22/23 0512  NA 135 135 137  K 3.4* 3.3* 3.6  CL 108 109 110  CO2 17* 16* 18*  GLUCOSE 122* 92 106*  BUN 74* 60* 64*  CREATININE 4.80* 3.86* 4.47*  CALCIUM 8.1* 7.7* 8.1*   LFT Recent Labs    04/19/23 1841  PROT 6.2*  ALBUMIN 2.9*  AST 28  ALT 34  ALKPHOS 95  BILITOT 0.5   PT/INR Recent Labs    04/20/23 0322  LABPROT 17.0*  INR 1.4*    Studies/Results: US Renal  Result Date: 04/20/2023 CLINICAL DATA:  Acute kidney injury. EXAM: RENAL / URINARY TRACT ULTRASOUND COMPLETE COMPARISON:  Abdomen ultrasound, 11/02/2020. FINDINGS: Right Kidney: Renal measurements: 9.7 x 4.7 x 5.6 cm =  volume: 133.2 mL. Echogenicity within normal limits. No mass or hydronephrosis visualized. Left Kidney: Renal measurements: 7.5 x 3.9 x 3.3 cm = volume: 50.3 mL. Increased parenchymal echogenicity. Diffuse cortical thinning and lobulation. No mass, stone or hydronephrosis. Bladder: Appears normal for degree of bladder distention. Other: None. IMPRESSION: 1. No acute findings.  No hydronephrosis. 2. Small left kidney with increased parenchymal echogenicity and cortical thinning, consistent with medical renal disease, mildly decreased in size compared to the prior ultrasound from 2021. Electronically Signed   By: Amie Portland M.D.   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   Acute on chronic blood loss anemia with melena:  Hgb 4.9 day of admission.  Received 3 units PRBCs with last transfusion 5/5.  Hemoglobin stable/improving, up to 8.9 today. No further overt GI bleeding. Last BM was yesterday morning without melena. Eliquis currently on hold.  EGD 5/4 with normal esophagus, gastritis biopsied, erythematous duodenopathy. Givens capsule swallowed this morning around 9 am, will be read  tomorrow.  May need to consider colonoscopy given history of cecal AVM if capsule is unrevealing.     Plan: Givens Capsule today. Will be read tomorrow.  Continue to monitor H/H and for overt GI bleeding.  Continue Protonix BID Further recommendations to follow Givens read tomorrow.    LOS: 3 days    04/22/2023, 8:22 AM   Ermalinda Memos, [facility]

## 2023-04-23 ENCOUNTER — Ambulatory Visit: Payer: Medicare HMO | Admitting: Cardiology

## 2023-04-23 DIAGNOSIS — K921 Melena: Secondary | ICD-10-CM | POA: Diagnosis not present

## 2023-04-23 DIAGNOSIS — D62 Acute posthemorrhagic anemia: Secondary | ICD-10-CM | POA: Diagnosis not present

## 2023-04-23 DIAGNOSIS — D649 Anemia, unspecified: Secondary | ICD-10-CM

## 2023-04-23 DIAGNOSIS — K5521 Angiodysplasia of colon with hemorrhage: Secondary | ICD-10-CM

## 2023-04-23 LAB — BASIC METABOLIC PANEL
Anion gap: 10 (ref 5–15)
BUN: 61 mg/dL — ABNORMAL HIGH (ref 8–23)
CO2: 17 mmol/L — ABNORMAL LOW (ref 22–32)
Calcium: 8.1 mg/dL — ABNORMAL LOW (ref 8.9–10.3)
Chloride: 110 mmol/L (ref 98–111)
Creatinine, Ser: 4.5 mg/dL — ABNORMAL HIGH (ref 0.61–1.24)
GFR, Estimated: 13 mL/min — ABNORMAL LOW (ref >60)
Glucose, Bld: 108 mg/dL — ABNORMAL HIGH (ref 70–99)
Potassium: 3.9 mmol/L (ref 3.5–5.1)
Sodium: 137 mmol/L (ref 135–145)

## 2023-04-23 LAB — SURGICAL PATHOLOGY

## 2023-04-23 LAB — CBC
HCT: 27.9 % — ABNORMAL LOW (ref 39.0–52.0)
Hemoglobin: 9 g/dL — ABNORMAL LOW (ref 13.0–17.0)
MCH: 30 pg (ref 26.0–34.0)
MCHC: 32.3 g/dL (ref 30.0–36.0)
MCV: 93 fL (ref 80.0–100.0)
Platelets: 159 10*3/uL (ref 150–400)
RBC: 3 MIL/uL — ABNORMAL LOW (ref 4.22–5.81)
RDW: 20.3 % — ABNORMAL HIGH (ref 11.5–15.5)
WBC: 9.1 10*3/uL (ref 4.0–10.5)
nRBC: 0 % (ref 0.0–0.2)

## 2023-04-23 LAB — MAGNESIUM: Magnesium: 1.9 mg/dL (ref 1.7–2.4)

## 2023-04-23 MED ORDER — ALLOPURINOL 100 MG PO TABS
50.0000 mg | ORAL_TABLET | ORAL | Status: DC
  Filled 2023-04-23: qty 1

## 2023-04-23 MED ORDER — CALCITRIOL 0.25 MCG PO CAPS
0.2500 ug | ORAL_CAPSULE | ORAL | Status: DC
  Administered 2023-04-25: 0.25 ug via ORAL
  Filled 2023-04-23: qty 1

## 2023-04-23 MED ORDER — FUROSEMIDE 20 MG PO TABS
20.0000 mg | ORAL_TABLET | Freq: Two times a day (BID) | ORAL | Status: DC
  Administered 2023-04-23 – 2023-04-25 (×4): 20 mg via ORAL
  Filled 2023-04-23 (×4): qty 1

## 2023-04-23 NOTE — Procedures (Addendum)
Small Bowel Givens Capsule Study Procedure date:  04/22/23  PCP:  Dr. Allena Katz, Earlie Lou, MD  Indication for procedure:  Anemia and melena   Findings:  a few AVMs throughout small bowel with wisps of blood noted without obvious active bleeding source.   First Gastric image:  4:45 First Duodenal image: 4:48:57 First Cecal image: 6:47:30 Gastric Passage time: 0h 44m Small Bowel Passage time:  1h 28m  Summary & Recommendations: First gastric image difficult to capture due to prolonged time in the esophagus. A few small AVMs noted in small bowel with some wisps of bright red blood noted though no active bleeding source identified. Green stool throughout.  Capsule did reach the cecum. Will continue PPI BID, trend h&h, monitor for overt GI bleeding. Will discuss possible pursuance of small bowel enteroscopy with Dr. Jena Gauss.     Chelsea L. Jeanmarie Hubert, MSN, APRN, AGNP-C Adult-Gerontology Nurse Practitioner [facility] [place]    Attending note:    Reviewed findings with Doylene Bode, NP.  Given findings of workup thus far, proceeding with small bowel enteroscopy is appropriate.

## 2023-04-23 NOTE — Progress Notes (Signed)
Jacob Farmer (DOB: 1946-09-28) JXB:[id number] PCP: Anabel Halon, MD  Brief Narrative: TRUTH BUNKLEY is a 77 y.o. male with medical history significant of chronic kidney disease stage IV, coronary artery disease with stents, history of colonic AVM, upper GI bleed secondary to bleeding ulcer, psoriatic arthritis, hypertension.  Patient was hospitalized in February due to bleeding ulcer which appeared to resolve after EGD and Carafate along with PPI.  Since his discharge, he has had progressive weakness.  Has noticed black stools, which she thought was due to the iron infusion he has been having.  He did get a call from his nephrologist who recommended that he come to [facility] for evaluation due to an elevated creatinine that was noticed about a month ago.  He was admitted for acute blood loss anemia secondary to upper GI bleed and has required 2 unit PRBC transfusion with need for repeat 2 unit PRBC transfusion and has received total 4 units thus far.  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 Enteroscopy is planned per GI.   Subjective: Had dark stool overnight, no abd pain, eating ok.   Objective: BP 138/68 (BP Location: Left Arm)   Pulse 81   Temp 97.8 F (36.6 C) (Oral)   Resp 18   Ht 5\' 10"  (1.778 m)   Wt 77 kg   SpO2 95%   BMI 24.36 kg/m   Gen: No distress, elderly pleasant male Pulm: Clear, nonlabored  CV: Irreg irreg, no MRG, trace pitting edema GI: Soft, NT, ND, +BS Neuro: Alert and oriented. No new focal deficits. Ext: Warm, no deformities. Skin: No rashes, lesions or ulcers on visualized skin   Assessment & Plan: Acute blood loss anemia secondary to upper GI bleed/GI hemorrhage/AVM: Transfused total 4 units since admission and now stable hemoglobin levels - Positive capsule study for SB  AVMs and blood, GI planning enteroscopy 5/9.  - Trend CBC - Continue PPI BID, carafate - Holding eliquis  PAF: Rate controlled currently without AV nodal agents.  - Holding eliquis with transfusion-dependent GI bleeding.   Stage IV CKD:  - Avoid nephrotoxins - Follow up with nephrology, Dr. Wolfgang Phoenix.  - Monitor BMP - Reorder home calcitriol, lasix  T2DM: Reported history, though glucose controlled without Tx. Last HbA1c earlier this year was 5.3%, would not be reliable now.   HTN:  - Continue norvasc, hydralazine  CAD: No angina - Transfusion threshold 8g/dl.   - Continue statin  History of prostate CA with suspicion for recent recurrence: Dx 2016 s/p brachytherapy 2017. Recent PSA rise in Feb 2024 to 7.49 (previously 0.69) - Follow up with Dr. Retta Diones.   RA, psoriatic arthritis, gout:  - Continue leflunomide, allopurinol. Can restart dupilumab after DC.  - Follow up with Dr. Corliss Skains.   Tyrone Nine, MD [facility] [email or web address] 04/23/2023, 6:32 PM

## 2023-04-24 ENCOUNTER — Inpatient Hospital Stay (HOSPITAL_COMMUNITY): Payer: Medicare HMO | Admitting: Certified Registered"

## 2023-04-24 ENCOUNTER — Inpatient Hospital Stay (HOSPITAL_COMMUNITY): Payer: Medicare HMO

## 2023-04-24 ENCOUNTER — Encounter (HOSPITAL_COMMUNITY): Payer: Self-pay | Admitting: Internal Medicine

## 2023-04-24 ENCOUNTER — Encounter (HOSPITAL_COMMUNITY)
Admission: EM | Disposition: A | Discharge status: Home / Self Care | Payer: Self-pay | Source: Home / Self Care | Attending: Internal Medicine

## 2023-04-24 DIAGNOSIS — K289 Gastrojejunal ulcer, unspecified as acute or chronic, without hemorrhage or perforation: Secondary | ICD-10-CM | POA: Diagnosis not present

## 2023-04-24 DIAGNOSIS — I10 Essential (primary) hypertension: Secondary | ICD-10-CM

## 2023-04-24 DIAGNOSIS — R933 Abnormal findings on diagnostic imaging of other parts of digestive tract: Secondary | ICD-10-CM

## 2023-04-24 DIAGNOSIS — Z87891 Personal history of nicotine dependence: Secondary | ICD-10-CM

## 2023-04-24 DIAGNOSIS — K633 Ulcer of intestine: Secondary | ICD-10-CM

## 2023-04-24 DIAGNOSIS — D62 Acute posthemorrhagic anemia: Secondary | ICD-10-CM | POA: Diagnosis not present

## 2023-04-24 DIAGNOSIS — K31819 Angiodysplasia of stomach and duodenum without bleeding: Secondary | ICD-10-CM | POA: Diagnosis not present

## 2023-04-24 DIAGNOSIS — I2510 Atherosclerotic heart disease of native coronary artery without angina pectoris: Secondary | ICD-10-CM

## 2023-04-24 DIAGNOSIS — D649 Anemia, unspecified: Secondary | ICD-10-CM

## 2023-04-24 DIAGNOSIS — E119 Type 2 diabetes mellitus without complications: Secondary | ICD-10-CM

## 2023-04-24 HISTORY — PX: SCLEROTHERAPY: SHX6841

## 2023-04-24 HISTORY — PX: SUBMUCOSAL TATTOO INJECTION: SHX6856

## 2023-04-24 HISTORY — PX: HEMOSTASIS CLIP PLACEMENT: SHX6857

## 2023-04-24 HISTORY — PX: HOT HEMOSTASIS: SHX5433

## 2023-04-24 HISTORY — PX: ENTEROSCOPY: SHX5533

## 2023-04-24 LAB — RENAL FUNCTION PANEL
Albumin: 2.4 g/dL — ABNORMAL LOW (ref 3.5–5.0)
Anion gap: 11 (ref 5–15)
BUN: 58 mg/dL — ABNORMAL HIGH (ref 8–23)
CO2: 17 mmol/L — ABNORMAL LOW (ref 22–32)
Calcium: 7.9 mg/dL — ABNORMAL LOW (ref 8.9–10.3)
Chloride: 109 mmol/L (ref 98–111)
Creatinine, Ser: 4.84 mg/dL — ABNORMAL HIGH (ref 0.61–1.24)
GFR, Estimated: 12 mL/min — ABNORMAL LOW (ref >60)
Glucose, Bld: 103 mg/dL — ABNORMAL HIGH (ref 70–99)
Phosphorus: 4.3 mg/dL (ref 2.5–4.6)
Potassium: 3 mmol/L — ABNORMAL LOW (ref 3.5–5.1)
Sodium: 137 mmol/L (ref 135–145)

## 2023-04-24 LAB — CBC
HCT: 28.3 % — ABNORMAL LOW (ref 39.0–52.0)
Hemoglobin: 9 g/dL — ABNORMAL LOW (ref 13.0–17.0)
MCH: 29.1 pg (ref 26.0–34.0)
MCHC: 31.8 g/dL (ref 30.0–36.0)
MCV: 91.6 fL (ref 80.0–100.0)
Platelets: 160 10*3/uL (ref 150–400)
RBC: 3.09 MIL/uL — ABNORMAL LOW (ref 4.22–5.81)
RDW: 19.3 % — ABNORMAL HIGH (ref 11.5–15.5)
WBC: 9.3 10*3/uL (ref 4.0–10.5)
nRBC: 0 % (ref 0.0–0.2)

## 2023-04-24 SURGERY — ENTEROSCOPY
Anesthesia: General

## 2023-04-24 MED ORDER — LACTATED RINGERS IV SOLN: INTRAVENOUS | Status: DC | PRN

## 2023-04-24 MED ORDER — LACTATED RINGERS IV SOLN: INTRAVENOUS | Status: DC

## 2023-04-24 MED ORDER — SODIUM BICARBONATE 650 MG PO TABS
650.0000 mg | ORAL_TABLET | Freq: Two times a day (BID) | ORAL | Status: DC
  Administered 2023-04-24 – 2023-04-25 (×3): 650 mg via ORAL
  Filled 2023-04-24 (×3): qty 1

## 2023-04-24 MED ORDER — LIDOCAINE HCL (CARDIAC) PF 100 MG/5ML IV SOSY
PREFILLED_SYRINGE | INTRAVENOUS | Status: DC | PRN
  Administered 2023-04-24: 50 mg via INTRAVENOUS

## 2023-04-24 MED ORDER — GLUCAGON HCL RDNA (DIAGNOSTIC) 1 MG IJ SOLR
INTRAMUSCULAR | Status: AC
  Filled 2023-04-24: qty 1

## 2023-04-24 MED ORDER — SODIUM CHLORIDE 0.9 % IV SOLN: INTRAVENOUS | Status: DC

## 2023-04-24 MED ORDER — PROPOFOL 10 MG/ML IV BOLUS
INTRAVENOUS | Status: DC | PRN
  Administered 2023-04-24: 50 mg via INTRAVENOUS

## 2023-04-24 MED ORDER — PROPOFOL 500 MG/50ML IV EMUL
INTRAVENOUS | Status: DC | PRN
  Administered 2023-04-24: 125 ug/kg/min via INTRAVENOUS

## 2023-04-24 MED ORDER — SPOT INK MARKER SYRINGE KIT
PACK | SUBMUCOSAL | Status: AC
  Filled 2023-04-24: qty 5

## 2023-04-24 MED ORDER — GLUCAGON HCL RDNA (DIAGNOSTIC) 1 MG IJ SOLR
INTRAMUSCULAR | Status: DC | PRN
  Administered 2023-04-24: .25 mg via INTRAVENOUS

## 2023-04-24 NOTE — Anesthesia Preprocedure Evaluation (Signed)
Anesthesia Evaluation  Patient identified by MRN, date of birth, ID band Patient awake    Reviewed: Allergy & Precautions, H&P , NPO status , Patient's Chart, lab work & pertinent test results, reviewed documented beta blocker date and time   Airway Mallampati: II  TM Distance: >3 FB Neck ROM: full    Dental no notable dental hx. (+) Partial Lower, Partial Upper   Pulmonary neg pulmonary ROS, former smoker   Pulmonary exam normal breath sounds clear to auscultation       Cardiovascular Exercise Tolerance: Good hypertension, + CAD  negative cardio ROS + dysrhythmias  Rhythm:regular Rate:Normal     Neuro/Psych negative neurological ROS  negative psych ROS   GI/Hepatic negative GI ROS, Neg liver ROS, PUD,,,  Endo/Other  negative endocrine ROSdiabetes    Renal/GU Renal diseasenegative Renal ROS  negative genitourinary   Musculoskeletal   Abdominal   Peds  Hematology negative hematology ROS (+) Blood dyscrasia, anemia   Anesthesia Other Findings   Reproductive/Obstetrics negative OB ROS                             Anesthesia Physical Anesthesia Plan  ASA: 4 and emergent  Anesthesia Plan: General   Post-op Pain Management:    Induction:   PONV Risk Score and Plan: Propofol infusion  Airway Management Planned:   Additional Equipment:   Intra-op Plan:   Post-operative Plan:   Informed Consent: I have reviewed the patients History and Physical, chart, labs and discussed the procedure including the risks, benefits and alternatives for the proposed anesthesia with the patient or authorized representative who has indicated his/her understanding and acceptance.     Dental Advisory Given  Plan Discussed with: CRNA  Anesthesia Plan Comments:        Anesthesia Quick Evaluation  

## 2023-04-24 NOTE — Op Note (Signed)
[facility] Patient Name: Jacob Farmer Procedure Date: 04/24/2023 9:05 AM MRN: [id number] Date of Birth: December 15, 1946 Attending MD: [name] , , [id number] CSN: [id number] Age: 77 Admit Type: Inpatient Procedure:                Small bowel enteroscopy Indications:              Melena, abnormal capsule study Providers:                [name], Tammy Vaught, RN, Kristine L.                            Jessee Avers, Technician Referring MD:              Medicines:                Monitored Anesthesia Care Complications:            No immediate complications. Estimated Blood Loss:     Estimated blood loss: none. Procedure:                Pre-Anesthesia Assessment:                           - Prior to the procedure, a History and Physical                            was performed, and patient medications, allergies                            and sensitivities were reviewed. The patient's                            tolerance of previous anesthesia was reviewed.                           - The risks and benefits of the procedure and the                            sedation options and risks were discussed with the                            patient. All questions were answered and informed                            consent was obtained.                           - ASA Grade Assessment: III - A patient with severe                            systemic disease.                           After obtaining informed consent, the endoscope was                            passed under direct vision. Throughout the  procedure, the patient's blood pressure, pulse, and                            oxygen saturations were monitored continuously. The                            [id number]) scope was introduced through                            the mouth and advanced to the proximal jejunum. The                            small bowel enteroscopy was accomplished  without                            difficulty. The patient tolerated the procedure                            well. Scope In: 9:18:24 AM Scope Out: 9:58:52 AM Total Procedure Duration: 0 hours 40 minutes 28 seconds  Findings:      The esophagus was normal.      The stomach was normal.      There was no evidence of significant pathology in the entire examined       duodenum.      One oozing cratered jejunal ulcer with very slow oozing hemorrhage at       the edge (Forrest Class Ib) was found in the proximal jejunum. Ulcer had       a yellowish coating. The lesion was 15 mm in largest dimension. For       hemostasis, two hemostatic clips were successfully placed (one was a       hemostatic clip). Clip manufacturer: [employer]. There was no       bleeding at the end of the procedure.      One non-bleeding cratered jejunal ulcer with a clean ulcer base (Forrest       Class III) was found in the proximal jejunum. Ulcer had a yellowish       coating. The lesion was 10 mm in largest dimension. For location       marking, one hemostatic clip was successfully placed. Clip manufacturer:       [employer]. There was no bleeding at the end of the procedure.      A single angiodysplastic lesion with no bleeding was found in the       proximal jejunum. Coagulation for bleeding prevention using argon plasma       at 0.3 liters/minute and 20 watts was successful. Area in proximity was       tattooed with an injection of 0.5 mL of Spot (carbon black) to mark most       distal area reached.      Note: ulcers in jejunum concerning for ischemic etiology. Impression:               - Normal esophagus.                           - Normal stomach.                           -  Normal examined duodenum.                           - Oozing jejunal ulcer with oozing hemorrhage                            (Forrest Class Ib). Clips were placed. Clip                            manufacturer: [employer].                           - Non-bleeding jejunal ulcer with a clean ulcer                            base (Forrest Class III). Clip was placed. Clip                            manufacturer: [employer].                           - A single non-bleeding angiodysplastic lesion in                            the duodenum. Treated with argon plasma coagulation                            (APC). Tattooed.                           - No specimens collected. Moderate Sedation:      Per Anesthesia Care Recommendation:           - Return patient to hospital ward for ongoing care.                           - Soft diet.                           - Use Protonix (pantoprazole) 40 mg PO BID.                           - Obtain mesenteric [place] to rule out bowel ischemia.                           - Discuss benefits vs risks of restarting                            anticoagulation. Hold Eliquis for now.                           - If recurrent clinical bleeding, may need to                            discuss with IR possibility of angiography. 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 All rights reserved. The codes documented in this report are preliminary and upon coder review may  be revised to meet current compliance requirements. [name], MD [name],  04/24/2023 10:10:37 AM This report has been signed electronically. Number of Addenda: 0

## 2023-04-24 NOTE — Progress Notes (Signed)
[facility] PROGRESS NOTE  BETTY DADA (DOB: 1946/03/03) ZOX:[id number] PCP: Anabel Halon, MD  Brief Narrative: Jacob Farmer is a 77 y.o. male with medical history significant of chronic kidney disease stage IV, coronary artery disease with stents, history of colonic AVM, upper GI bleed secondary to bleeding ulcer, psoriatic arthritis, hypertension.  Patient was hospitalized in February due to bleeding ulcer which appeared to resolve after EGD and Carafate along with PPI.  Since his discharge, he has had progressive weakness.  Has noticed black stools, which she thought was due to the iron infusion he has been having.  He did get a call from his nephrologist who recommended that he come to [facility] for evaluation due to an elevated creatinine that was noticed about a month ago.  He was admitted for acute blood loss anemia secondary to upper GI bleed and has required 2 unit PRBC transfusion with need for repeat 2 unit PRBC transfusion and has received total 4 units thus far.  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 Enteroscopy is planned per GI.   Subjective: No further dark stools, no abdominal pain, does not report nausea or vomiting. Feels "fine."   Objective: BP 138/71   Pulse 67   Temp (!) 97.5 F (36.4 C)   Resp 17   Ht 5\' 10"  (1.778 m)   Wt 77 kg   SpO2 100%   BMI 24.36 kg/m   Gen: No distress Pulm: Nonlabored  CV: Regular, NSR by monitor, no visible edema Neuro: Alert and oriented. No new focal deficits. Ext: Warm, no deformities. Skin: No rashes, lesions or ulcers on visualized skin   Enteroscopy 04/24/2023 Dr. Levon Hedger: Impression:        - Normal esophagus.                           - Normal stomach.                           - Normal examined duodenum.                           - Oozing jejunal ulcer with  oozing hemorrhage                            (Forrest Class Ib). Clips were placed. Clip                            manufacturer: [employer].                           - Non-bleeding jejunal ulcer with a clean ulcer                            base (Forrest Class III). Clip was placed. Clip                            manufacturer: [employer].                           -  A single non-bleeding angiodysplastic lesion in                            the duodenum. Treated with argon plasma coagulation                            (APC). Tattooed.                           - No specimens collected.   Recommendation: - Return patient to hospital ward for ongoing care.                           - Soft diet.                           - Use Protonix (pantoprazole) 40 mg PO BID.                           - Obtain mesenteric [place] to rule out bowel ischemia.                           - Discuss benefits vs risks of restarting                            anticoagulation. Hold Eliquis for now.                           - If recurrent clinical bleeding, may need to                            discuss with IR possibility of angiography.  Assessment & Plan: Acute blood loss anemia secondary to upper GI bleed/GI hemorrhage/AVM: Transfused total 4 units since admission. Positive capsule study for SB AVMs and blood > enteroscopy 5/9 showed oozing jejunal ulcer (clipped with hemostasis) and non-bleeding clean based ulcer (clipped for location) as well as AVM without bleeding treated with APC. - D/w Dr. Levon Hedger. Will pursue U/S to evaluate for ischemic etiology to jejunal ulcers. CrCl ~80ml/min currently. Will keep NPO until this study, then start soft diet per GI. - Trend CBC, Hgb stable this AM at 9.0. - Continue PPI BID, carafate - Holding eliquis still per GI  PAF: Rate controlled currently without AV nodal agents.  - Holding eliquis with transfusion-dependent GI bleeding.   Stage IV CKD:  - Avoid  nephrotoxins - Follow up with nephrology, Dr. Wolfgang Phoenix.  - Monitor BMP (stable) - Reorder home calcitriol, lasix - Start bicarbonate tabs for NAGMA  T2DM: Reported history, though glucose controlled without Tx. Last HbA1c earlier this year was 5.3%, would not be reliable now.   HTN:  - Continue norvasc, hydralazine  CAD: No angina - Transfusion threshold 8g/dl.   - Continue statin  History of prostate CA with suspicion for recent recurrence: Dx 2016 s/p brachytherapy 2017. Recent PSA rise in Feb 2024 to 7.49 (previously 0.69) - Follow up with Dr. Retta Diones.   RA, psoriatic arthritis, gout:  - Continue leflunomide, allopurinol. Can restart dupilumab after DC.  - Follow up with Dr. Corliss Skains.   Hypokalemia:  - Supplement  Raniyah Curenton  Dario Guardian, MD [facility] [email or web address] 04/24/2023, 11:13 AM

## 2023-04-24 NOTE — Progress Notes (Signed)
Pt to endo for procedure.

## 2023-04-24 NOTE — Brief Op Note (Signed)
04/24/2023  10:09 AM  PATIENT:  Jacob Farmer  77 y.o. male  PRE-OPERATIVE DIAGNOSIS:  anemia, melena, AVMs of small bowel noted on givens capsule study  POST-OPERATIVE DIAGNOSIS:  a couple of ulcers in small bowel, 0.43ml of spot used  PROCEDURE:  Procedure(s): ENTEROSCOPY (N/A) HEMOSTASIS CLIP PLACEMENT HOT HEMOSTASIS (ARGON PLASMA COAGULATION/BICAP) SCLEROTHERAPY SUBMUCOSAL TATTOO INJECTION  SURGEON:  Surgeon(s) and Role:    * Dolores Frame, MD - Primary  Patient underwent push enteroscopy under propofol sedation.  Tolerated the procedure adequately.   FINDINGS: - Normal esophagus.  - Normal stomach.  - Normal examined duodenum.  - One oozing cratered jejunal ulcer with very slow oozing hemorrhage at the edge (Forrest Class Ib) was found in the proximal jejunum. Ulcer had a yellowish coating. The lesion was 15 mm in largest dimension.  For hemostasis, two hemostatic clips were successfully placed (one was a hemostatic clip).  Clip manufacturer: [employer].  There was no bleeding at the end of the procedure.  - One non-bleeding cratered jejunal ulcer with a clean ulcer base (Forrest Class III) was found in the proximal jejunum. Ulcer had a yellowish coating.   The lesion was 10 mm in largest dimension.  For location marking, one hemostatic clip was successfully placed.  Clip manufacturer: [employer].  There was no bleeding at the end of the procedure.  - A single non-bleeding angiodysplastic lesion in the duodenum.  Treated with argon plasma coagulation (APC).  Tattooed.   RECOMMENDATIONS - Return patient to hospital ward for ongoing care.  - Soft diet.  - Use Protonix (pantoprazole) 40 mg PO BID.  - Obtain mesenteric [place] to rule out bowel ischemia. - Discuss benefits vs risks of restarting anticoagulation. Hold Eliquis for now. - If recurrent clinical bleeding, may need to discuss with IR possibility of angiography.  [name],  MD Gastroenterology and Hepatology [facility] Gastroenterology

## 2023-04-24 NOTE — Progress Notes (Signed)
We will proceed with push enteroscopy as scheduled.  I thoroughly discussed with the patient the procedure, including the risks involved. Patient understands what the procedure involves including the benefits and any risks. Patient understands alternatives to the proposed procedure. Risks including (but not limited to) bleeding, tearing of the lining (perforation), rupture of adjacent organs, problems with heart and lung function, infection, and medication reactions. A small percentage of complications may require surgery, hospitalization, repeat endoscopic procedure, and/or transfusion.  Patient understood and agreed.  Jaslynn Thome Castaneda, MD Gastroenterology and Hepatology [facility]  

## 2023-04-24 NOTE — Transfer of Care (Signed)
Immediate Anesthesia Transfer of Care Note  Patient: Jacob Farmer  Procedure(s) Performed: ENTEROSCOPY HEMOSTASIS CLIP PLACEMENT HOT HEMOSTASIS (ARGON PLASMA COAGULATION/BICAP) SCLEROTHERAPY SUBMUCOSAL TATTOO INJECTION  Patient Location: PACU  Anesthesia Type:General  Level of Consciousness: drowsy  Airway & Oxygen Therapy: Patient Spontanous Breathing and Patient connected to nasal cannula oxygen  Post-op Assessment: Report given to RN and Post -op Vital signs reviewed and stable  Post vital signs: Reviewed and stable  Last Vitals:  Vitals Value Taken Time  BP 116/58   Temp 97.5   Pulse 57 04/24/23 1002  Resp 19   SpO2 97 % 04/24/23 1002  Vitals shown include unvalidated device data.  Last Pain:  Vitals:   04/24/23 0914  TempSrc:   PainSc: 0-No pain         Complications: No notable events documented.

## 2023-04-25 ENCOUNTER — Encounter (HOSPITAL_COMMUNITY): Payer: Self-pay | Admitting: Gastroenterology

## 2023-04-25 ENCOUNTER — Telehealth: Payer: Self-pay | Admitting: Gastroenterology

## 2023-04-25 DIAGNOSIS — D62 Acute posthemorrhagic anemia: Secondary | ICD-10-CM | POA: Diagnosis not present

## 2023-04-25 LAB — RENAL FUNCTION PANEL
Albumin: 2.4 g/dL — ABNORMAL LOW (ref 3.5–5.0)
Anion gap: 12 (ref 5–15)
BUN: 57 mg/dL — ABNORMAL HIGH (ref 8–23)
CO2: 17 mmol/L — ABNORMAL LOW (ref 22–32)
Calcium: 7.9 mg/dL — ABNORMAL LOW (ref 8.9–10.3)
Chloride: 108 mmol/L (ref 98–111)
Creatinine, Ser: 4.9 mg/dL — ABNORMAL HIGH (ref 0.61–1.24)
GFR, Estimated: 11 mL/min — ABNORMAL LOW (ref >60)
Glucose, Bld: 116 mg/dL — ABNORMAL HIGH (ref 70–99)
Phosphorus: 4.2 mg/dL (ref 2.5–4.6)
Potassium: 2.6 mmol/L — CL (ref 3.5–5.1)
Sodium: 137 mmol/L (ref 135–145)

## 2023-04-25 LAB — CBC
HCT: 29.5 % — ABNORMAL LOW (ref 39.0–52.0)
Hemoglobin: 9.4 g/dL — ABNORMAL LOW (ref 13.0–17.0)
MCH: 29.5 pg (ref 26.0–34.0)
MCHC: 31.9 g/dL (ref 30.0–36.0)
MCV: 92.5 fL (ref 80.0–100.0)
Platelets: 159 10*3/uL (ref 150–400)
RBC: 3.19 MIL/uL — ABNORMAL LOW (ref 4.22–5.81)
RDW: 18.4 % — ABNORMAL HIGH (ref 11.5–15.5)
WBC: 8.8 10*3/uL (ref 4.0–10.5)
nRBC: 0 % (ref 0.0–0.2)

## 2023-04-25 MED ORDER — SUCRALFATE 1 GM/10ML PO SUSP: 1.0000 g | Freq: Three times a day (TID) | ORAL | 0 refills | Status: DC

## 2023-04-25 MED ORDER — POTASSIUM CHLORIDE CRYS ER 20 MEQ PO TBCR
40.0000 meq | EXTENDED_RELEASE_TABLET | Freq: Two times a day (BID) | ORAL | Status: DC
  Administered 2023-04-25: 40 meq via ORAL
  Filled 2023-04-25: qty 2

## 2023-04-25 NOTE — Telephone Encounter (Signed)
[name] - Please arrange CBC in 1 week.  [name] - please arrange hospital follow up in 3-4 weeks with Dr. Elenora Fender, or any other APP if no availability.   Brooke Bonito, MSN, APRN, FNP-BC, AGACNP-BC [facility] Gastroenterology at [facility]

## 2023-04-25 NOTE — Consult Note (Signed)
[facility] Customer service manager [facility]) Accountable Care Organization (ACO) [employer] [facility] Liaison Note  04/25/2023  Jacob Farmer [birth year] [id number]  Location: [facility] RN Hospital Liaison screened the patient remotely at [facility].  Insurance: [employer]   [name] is a 77 y.o. male who is a Primary Care Patient of Anabel Halon, MD. The patient was screened for  readmission hospitalization with noted medium risk score for unplanned readmission risk with 2 IP in 6 months.  The patient was assessed for potential [facility]) Care Management service needs for post hospital transition for care coordination. Review of patient's electronic medical record reveals patient was admitted for Acute Blood loss anemia. [facility] liaison unsuccessful with outreach attempts. Plan: [facility] Liaison will continue to follow progress and disposition to asess for post hospital community care coordination/management needs.  Referral request for community care coordination: anticipate [facility] Transitions of Care Team follow up.   [facility] Care Management/Population Health does not replace or interfere with any arrangements made by the Inpatient Transition of Care team.   For questions contact:   Elliot Cousin, RN, BSN [facility] Liaison [place]   [facility]  Population Health Office Hours MTWF 8:00 am to 6 pm off on Thursday [phone] mobile [phone] [Office toll free line][facility] Office Hours are M-F 8:30 - 5 pm 24 hour nurse advise line [phone] Conceirge  [email or web address]

## 2023-04-25 NOTE — Discharge Summary (Signed)
Physician Discharge Summary   Patient: Jacob Farmer MRN: [id number] DOB: 1946-07-14  Admit date:     04/19/2023  Discharge date: 04/25/23  Discharge Physician: Tyrone Nine   PCP: Anabel Halon, MD   Recommendations at discharge:  Follow up with PCP in 1-2 weeks with repeat BMP and CBC.  Follow up with GI, to be arranged by [facility].  Follow up with nephrology, cardiology, and rheumatology per routine. Follow up with vascular surgery for newly identified SMA stenosis, the suspected culprit for bleeding jejunal ulcers.   Discharge Diagnoses: Principal Problem:   Acute blood loss anemia Active Problems:   Essential hypertension   Coronary artery disease involving native coronary artery of native heart without angina pectoris   Rheumatoid arthritis (HCC)   Chronic kidney disease, stage 4 (severe) (HCC)   Type 2 diabetes mellitus with diabetic chronic kidney disease (HCC)   Paroxysmal atrial fibrillation (HCC)   Chronic heart failure with preserved ejection fraction (HCC)   AVM (arteriovenous malformation) of colon   Upper GI bleed   Acute GI bleeding   Melena  Hospital Course: Jacob Farmer is a 77 y.o. male with medical history significant of chronic kidney disease stage IV, coronary artery disease with stents, history of colonic AVM, upper GI bleed secondary to bleeding ulcer, psoriatic arthritis, hypertension.  Patient was hospitalized in February due to bleeding ulcer which appeared to resolve after EGD and Carafate along with PPI.  Since his discharge, he has had progressive weakness.  Has noticed black stools, which she thought was due to the iron infusion he has been having.  He did get a call from his nephrologist who recommended that he come to [facility] for evaluation due to an elevated creatinine that was noticed about a month ago.  He was admitted for acute blood loss anemia secondary to upper GI bleed and has required 2 unit PRBC transfusion with need for  repeat 2 unit PRBC transfusion and has received total 4 units thus far.  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 Enteroscopy performed 5/9 revealed jejunal ulcers and an AVM which were treated with clips and APC, respectively. Mesenteric artery ultrasound revealed SMA stenosis for which the patient will follow up with vascular surgery after discharge. Hemoglobin has remained stable with no evidence of ongoing bleeding. He has been cleared for discharge by the GI and medical teams. Please see details below.  Assessment and Plan: Acute blood loss anemia secondary to upper GI bleed due to jejunal ulcers, AVM: Transfused total 4 units since admission. Positive capsule study for SB AVMs and blood > enteroscopy 5/9 showed oozing jejunal ulcer (clipped with hemostasis) and non-bleeding clean based ulcer (clipped for location) as well as AVM without bleeding treated with APC.  - D/w Dr. Levon Hedger daily, will arrange GI follow up. We continue to recommend holding eliquis. The patient and spouse are well aware of stroke risk while doing this which is certainly lower than the risk of potentially life-threatening bleeding. - Trend CBC at follow up, Hgb stable this AM at 9.4. 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  - Discussed with vascular surgery, Dr. Randie Heinz, who will accept the referral, and arrange office follow up to discuss further management.   PAF: Rate  controlled currently without AV nodal agents.  - Holding eliquis with transfusion-dependent GI bleeding.    Stage IV CKD: Complicates use of angiography - Avoid nephrotoxins - Follow up with nephrology, Dr. Wolfgang [place].  - Monitor BMP at follow up - Reorder home calcitriol, lasix    T2DM: Reported history, though  glucose controlled without Tx. Last HbA1c earlier this year was 5.3%, would not be reliable now.    HTN:  - Continue norvasc, hydralazine   CAD: No angina - Transfusion threshold 8g/dl.   - Continue statin   History of prostate CA with suspicion for recent recurrence: Dx 2016 s/p brachytherapy 2017. Recent PSA rise in Feb 2024 to 7.49 (previously 0.69) - Follow up with Dr. Retta Diones.    RA, psoriatic arthritis, gout:  - Continue leflunomide, allopurinol. Can restart dupilumab after DC.  - Follow up with Dr. Corliss Skains.    Hypokalemia:  - Supplement  Consultants: GI, vascular surgery Procedures performed:  Enteroscopy 04/24/2023 Dr. Levon Hedger: Impression:        - Normal esophagus.                           - Normal stomach.                           - Normal examined duodenum.                           - Oozing jejunal ulcer with oozing hemorrhage                            (Forrest Class Ib). Clips were placed. Clip                            manufacturer: [employer].                           - Non-bleeding jejunal ulcer with a clean ulcer                            base (Forrest Class III). Clip was placed. Clip                            manufacturer: [employer].                           - A single non-bleeding angiodysplastic lesion in                            the duodenum. Treated with argon plasma coagulation                            (APC). Tattooed.                           - No specimens collected.   Recommendation: - Return patient to hospital ward for ongoing care.                           - Soft  diet.                           - Use Protonix (pantoprazole) 40 mg PO BID.                           - Obtain mesenteric [place] to rule out bowel ischemia.                           - Discuss benefits vs risks of restarting                            anticoagulation. Hold Eliquis for now.                           - If recurrent clinical bleeding, may need  to                            discuss with IR possibility of angiography.  Disposition: Home Diet recommendation: Soft DISCHARGE MEDICATION: Allergies as of 04/25/2023   No Known Allergies      Medication List     STOP taking these medications    apixaban 5 MG Tabs tablet Commonly known as: ELIQUIS       TAKE these medications    Accu-Chek Guide test strip Generic drug: glucose blood Use as instructed   Accu-Chek Guide w/Device Kit 1 Units by Does not apply route 2 (two) times daily.   accu-chek soft touch lancets Use as instructed   allopurinol 100 MG tablet Commonly known as: ZYLOPRIM TAKE 1/2 TABLET EVERY OTHER DAY What changed:  how much to take how to take this when to take this additional instructions   amLODipine 10 MG tablet Commonly known as: NORVASC Take 1 tablet (10 mg total) by mouth daily.   calcitRIOL 0.25 MCG capsule Commonly known as: ROCALTROL Take 0.25 mcg by mouth. M, W, F   Colchicine 0.6 MG Caps Take 0.6 mg by mouth daily as needed (Gout).   DropSafe Alcohol Prep 70 % Pads USE AS NEEDED TO CLEAN SKIN PRIOR TO FINGERPRICK AND INJECTION   Dupixent 300 MG/2ML Sopn Generic drug: Dupilumab Inject 1 application into the skin every 14 (fourteen) days.   folic acid 400 MCG tablet Commonly known as: FOLVITE Take 400 mcg by mouth daily.   furosemide 20 MG tablet Commonly known as: LASIX Take 20 mg by mouth 2 (two) times daily.   hydrALAZINE 50 MG tablet Commonly known as: APRESOLINE Take 100 mg by mouth 2 (two) times daily.   leflunomide 10 MG tablet Commonly known as: ARAVA Take 1 tablet (10 mg total) by mouth daily.   nitroGLYCERIN 0.4 MG SL tablet Commonly known as: NITROSTAT Place 0.4 mg under the tongue every 5 (five) minutes as needed for chest pain.   pantoprazole 40 MG tablet Commonly known as: PROTONIX Take 1 tablet (40 mg total) by mouth 2 (two) times daily.   potassium chloride SA 20 MEQ tablet Commonly known  as: KLOR-CON M Take 20 mEq by mouth daily.   rosuvastatin 40 MG tablet Commonly known as: CRESTOR TAKE 1 TABLET EVERY DAY. STOP ATORVASTATIN What changed: See the new instructions.   sucralfate 1 GM/10ML suspension Commonly known as: CARAFATE Take 10 mLs (1 g total)  by mouth 4 (four) times daily -  with meals and at bedtime.   triamcinolone cream 0.1 % Commonly known as: KENALOG Apply 1 Application topically daily.        Follow-up Information     Anabel Halon, MD Follow up.   Specialty: Internal Medicine Contact information: [address] [place] [address] [phone]         [facility] Follow up.   Why: Please contact the office if you have not been contacted to arrange a follow up appointment Contact information: [address] [place] [address] [phone]        [facility] Follow up.   Contact information: [address] [place] [address] [phone]               Discharge Exam: Ceasar Mons Weights   04/19/23 2230 04/21/23 0545 04/21/23 1345  Weight: 73.4 kg 76.6 kg 77 kg  BP (!) 143/71 (BP Location: Left Arm)   Pulse 87   Temp 98.3 F (36.8 C) (Oral)   Resp 19   Ht 5\' 10"  (1.778 m)   Wt 77 kg   SpO2 100%   BMI 24.36 kg/m   No distress, well-appearing older male Clear, nonlabored Irreg irreg, no MRG or pitting edema Hypoactive BS, soft, NT, ND Alert, oriented, no focal deficits  Condition at discharge: stable  The results of significant diagnostics from this hospitalization (including imaging, microbiology, ancillary and laboratory) are listed below for reference.   Imaging Studies: [place] MESENTERIC ARTERIES  Result Date: 04/24/2023 CLINICAL DATA:  Gastrointestinal hemorrhage due to angio dysplasia of the stomach and duodenal. EXAM: [place] MESENTERIC ARTERIAL DOPPLER COMPARISON:  CT abdomen pelvis 07/15/2019 FINDINGS: Celiac axis: 46 cm/sec Celiac  axis with inspiration: 54 cm/sec Celiac axis with expiration: 57 cm/sec Splenic artery: 50 cm/sec Hepatic artery: 118 cm/sec SMA: 327 cm/sec IMA: 117 cm/sec Aorta: 81 cm/sec Aortic size: 2.7 cm proximally, 2.6 cm in the mid segment and 3.1 cm distally IMPRESSION: 1. 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 2. 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 Recommend follow-up every 3 years. This recommendation follows ACR consensus guidelines: White Paper of the ACR Incidental Findings Committee II on Vascular Findings. J Am Coll Radiol 2013; 10:789-794. Electronically Signed   By: Acquanetta Belling M.D.   On: 04/24/2023 15:28   US Renal  Result Date: 04/20/2023 CLINICAL DATA:  Acute kidney injury. EXAM: RENAL / URINARY TRACT ULTRASOUND COMPLETE COMPARISON:  Abdomen ultrasound, 11/02/2020. FINDINGS: Right Kidney: Renal measurements: 9.7 x 4.7 x 5.6 cm = volume: 133.2 mL. Echogenicity within normal limits. No mass or hydronephrosis visualized. Left Kidney: Renal measurements: 7.5 x 3.9 x 3.3 cm = volume: 50.3 mL. Increased parenchymal echogenicity. Diffuse cortical thinning and lobulation. No mass, stone or hydronephrosis. Bladder: Appears normal for degree of bladder distention. Other: None. IMPRESSION: 1. No acute findings.  No hydronephrosis. 2. Small left kidney with increased parenchymal echogenicity and cortical thinning, consistent with medical renal disease, mildly decreased in size compared to the prior ultrasound from 2021. Electronically Signed   By: Amie Portland M.D.   On: 04/20/2023 10:56    Microbiology: Results for orders placed or performed during the hospital encounter of 04/19/23  MRSA Next Gen by PCR, Nasal     Status: None   Collection Time: 04/19/23 11:00  PM   Specimen: Nasal Mucosa; Nasal Swab  Result Value Ref Range  Status   MRSA by PCR Next Gen NOT DETECTED NOT DETECTED Final    Comment: (NOTE) The GeneXpert MRSA Assay (FDA approved for NASAL specimens only), is one component of a comprehensive MRSA colonization surveillance program. It is not intended to diagnose MRSA infection nor to guide or monitor treatment for MRSA infections. Test performance is not FDA approved in patients less than 61 years old. Performed at [facility], [address], [place], [place] [address]     Labs: CBC: Recent Labs  Lab 04/21/23 [phone] 04/22/23 0512 04/23/23 0508 04/24/23 0808 04/25/23 0453  WBC 9.4 9.6 9.1 9.3 8.8  HGB 7.8* 8.9* 9.0* 9.0* 9.4*  HCT 24.0* 27.0* 27.9* 28.3* 29.5*  MCV 92.3 93.8 93.0 91.6 92.5  PLT 171 177 159 160 159   Basic Metabolic Panel: Recent Labs  Lab 04/21/23 0434 04/22/23 0512 04/23/23 0508 04/24/23 0808 04/25/23 0453  NA 135 137 137 137 137  K 3.3* 3.6 3.9 3.0* 2.6*  CL 109 110 110 109 108  CO2 16* 18* 17* 17* 17*  GLUCOSE 92 106* 108* 103* 116*  BUN 60* 64* 61* 58* 57*  CREATININE 3.86* 4.47* 4.50* 4.84* 4.90*  CALCIUM 7.7* 8.1* 8.1* 7.9* 7.9*  MG 1.7 1.9 1.9  --   --   PHOS  --   --   --  4.3 4.2   Liver Function Tests: Recent Labs  Lab 04/19/23 1841 04/24/23 0808 04/25/23 0453  AST 28  --   --   ALT 34  --   --   ALKPHOS 95  --   --   BILITOT 0.5  --   --   PROT 6.2*  --   --   ALBUMIN 2.9* 2.4* 2.4*   CBG: No results for input(s): "GLUCAP" in the last 168 hours.  Discharge time spent: greater than 30 minutes.  Signed: Tyrone Nine, MD [facility] 04/25/2023

## 2023-04-25 NOTE — Plan of Care (Signed)

## 2023-04-25 NOTE — Progress Notes (Signed)
Gastroenterology Progress Note   Referring Provider: No ref. provider found Primary Care Physician:  Anabel Halon, MD Primary Gastroenterologist:  Dr. Levon Hedger  Patient ID: Jacob Farmer; [id number]; 1946/03/24    Subjective   Denies abdominal pain, chest pain, shortness of breath. No stools today. Last BM with capsule study and it was black. + flatus. Good appetite.   Objective   Vital signs in last 24 hours Temp:  [97.9 F (36.6 C)-98.3 F (36.8 C)] 98.3 F (36.8 C) (05/10 0403) Pulse Rate:  [67-79] 73 (05/10 0403) Resp:  [17-20] 20 (05/09 2018) BP: (138-168)/(70-78) 146/70 (05/10 0403) SpO2:  [100 %] 100 % (05/10 0403) Last BM Date : 04/22/23  Physical Exam General:   Alert and oriented, pleasant Head:  Normocephalic and atraumatic. Eyes:  No icterus, sclera clear. Conjuctiva pink.  Lungs: Clear to auscultation bilaterally, without wheezing, rales, or rhonchi.  Abdomen:  Bowel sounds present, soft, non-tender, non-distended. No HSM or hernias noted. No rebound or guarding. No masses appreciated  Neurologic:  Alert and  oriented x4;  grossly normal neurologically. Skin:  Warm and dry, intact without significant lesions.  Psych:  Alert and cooperative. Normal mood and affect.  Intake/Output from previous day: 05/09 0701 - 05/10 0700 In: 740 [P.O.:240; I.V.:500] Out: 600 [Urine:600] Intake/Output this shift: Total I/O In: -  Out: 300 [Urine:300]  Lab Results  Recent Labs    04/23/23 0508 04/24/23 0808 04/25/23 0453  WBC 9.1 9.3 8.8  HGB 9.0* 9.0* 9.4*  HCT 27.9* 28.3* 29.5*  PLT 159 160 159   BMET Recent Labs    04/23/23 0508 04/24/23 0808 04/25/23 0453  NA 137 137 137  K 3.9 3.0* 2.6*  CL 110 109 108  CO2 17* 17* 17*  GLUCOSE 108* 103* 116*  BUN 61* 58* 57*  CREATININE 4.50* 4.84* 4.90*  CALCIUM 8.1* 7.9* 7.9*   LFT Recent Labs    04/24/23 0808 04/25/23 0453  ALBUMIN 2.4* 2.4*   PT/INR No results for input(s): "LABPROT", "INR" in  the last 72 hours. Hepatitis Panel No results for input(s): "HEPBSAG", "HCVAB", "HEPAIGM", "HEPBIGM" in the last 72 hours.  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 EXAM: [place] MESENTERIC ARTERIAL DOPPLER COMPARISON:  CT abdomen pelvis 07/15/2019 FINDINGS: Celiac axis: 46 cm/sec Celiac axis with inspiration: 54 cm/sec Celiac axis with expiration: 57 cm/sec Splenic artery: 50 cm/sec Hepatic artery: 118 cm/sec SMA: 327 cm/sec IMA: 117 cm/sec Aorta: 81 cm/sec Aortic size: 2.7 cm proximally, 2.6 cm in the mid segment and 3.1 cm distally IMPRESSION: 1. 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 2. 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 Recommend follow-up every 3 years. This recommendation follows ACR consensus guidelines: White Paper of the ACR Incidental Findings Committee II on Vascular Findings. J Am Coll Radiol 2013; 10:789-794. Electronically Signed   By: Acquanetta Belling M.D.   On: 04/24/2023 15:28   US Renal  Result Date: 04/20/2023 CLINICAL DATA:  Acute kidney injury. EXAM: RENAL / URINARY TRACT ULTRASOUND COMPLETE COMPARISON:  Abdomen ultrasound, 11/02/2020. FINDINGS: Right Kidney: Renal measurements: 9.7 x 4.7 x 5.6 cm = volume: 133.2 mL. Echogenicity within normal limits. No mass or hydronephrosis visualized. Left Kidney: Renal measurements: 7.5 x 3.9 x 3.3 cm = volume: 50.3 mL.  Increased parenchymal echogenicity. Diffuse cortical thinning and lobulation. No mass, stone or hydronephrosis. Bladder: Appears normal for degree of bladder distention. Other: None. IMPRESSION: 1. No acute findings.  No hydronephrosis. 2. Small left kidney with increased parenchymal echogenicity and cortical thinning, consistent with  medical renal disease, mildly decreased in size compared to the prior ultrasound from 2021. Electronically Signed   By: Amie Portland M.D.   On: 04/20/2023 10:56    Assessment  77 y.o. 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  GI consulted for further evaluation.  Acute on chronic blood loss anemia with melena: Presented with hemoglobin of 4.9.  Has received total 3 units PRBCs, last transfusion 5/5.  Hemoglobin remained stable, 9.4 today.  Eliquis currently remains on hold.  Underwent EGD 5/4 with normal esophagus and gastritis, erythematous duodenopathy.  Biopsies revealed parietal cell hyperplasia, reactive gastropathy, negative for H. pylori.  Capsule endoscopy swallowed 5/7 with repeat on 5/8 revealing complete study to the cecum and identification of a few small AVMs within the small bowel with wisps of bright red blood without active bleeding source identified.  Enteroscopy 5/9 with findings of normal esophagus, stomach, duodenum, oozing cratered duodenal ulcer with previously using hemorrhage at the edge found in the proximal jejunum.  Ulcer with yellowish coating and measuring 5 mm at largest dimension.  2 hemostatic clips placed successfully.  Second nonbleeding cratered duodenal ulcer with clean ulcer base found in the proximal jejunum with yellowish coating, measuring 10 mm in largest dimension with placement of 1 hemostatic clip.  Single nonbleeding angiodysplastic lesion in the duodenum treated with APC therapy and tattooed.  Mesenteric Dopplers completed 04/24/2023 with elevated peak systolic velocity in the mid superior mesenteric artery suspicious for greater than 70% stenosis.  Also with distal  infrarenal abdominal aortic aneurysm slightly increased from prior (2.8>>3.1cm) and recommended follow-up every 3 years.  We discussed results of dopplers and findings of enteroscopy yesterday. Advised that he is at high risk of re bleeding while on anticoagulation and high risk of stroke by not continuing anticoagulation. Given hgb is stable and he is tolerating diet it is reasonable for him to discharge home with close follow up with GI and vascular surgery vs IR. He will continue to hold Eliquis until he sees vascular surgery and PCP.    Plan / Recommendations  Continue PPI twice daily Continue to hold Eliquis for now, need to discuss benefits versus risks restarting anticoagulation Given concern for mesenteric ischemia contributing to small bowel ulcerations he will likely need to have an outpatient evaluation with IR or vascular surgery. Follow up in the clinic in 3-4 weeks.   CBC in 1 week.     LOS: 6 days    04/25/2023, 10:29 AM   Brooke Bonito, MSN, FNP-BC, AGACNP-BC [facility]

## 2023-04-25 NOTE — Anesthesia Postprocedure Evaluation (Signed)
Anesthesia Post Note  Patient: Jacob Farmer  Procedure(s) Performed: ENTEROSCOPY HEMOSTASIS CLIP PLACEMENT HOT HEMOSTASIS (ARGON PLASMA COAGULATION/BICAP) SCLEROTHERAPY SUBMUCOSAL TATTOO INJECTION  Patient location during evaluation: Phase II Anesthesia Type: General Level of consciousness: awake Pain management: pain level controlled Vital Signs Assessment: post-procedure vital signs reviewed and stable Respiratory status: spontaneous breathing and respiratory function stable Cardiovascular status: blood pressure returned to baseline and stable Postop Assessment: no headache and no apparent nausea or vomiting Anesthetic complications: no Comments: Late entry   No notable events documented.   Last Vitals:  Vitals:   04/25/23 0904 04/25/23 1218  BP:  (!) 143/71  Pulse:  87  Resp: (!) 24 19  Temp:    SpO2:  100%    Last Pain:  Vitals:   04/25/23 0904  TempSrc:   PainSc: 0-No pain                 Windell Norfolk

## 2023-04-25 NOTE — Care Management Important Message (Signed)
Important Message  Patient Details  Name: Jacob Farmer MRN: [id number] Date of Birth: [birth year]   Medicare Important Message Given:  Yes     Corey Harold 04/25/2023, 10:17 AM

## 2023-04-28 ENCOUNTER — Other Ambulatory Visit (INDEPENDENT_AMBULATORY_CARE_PROVIDER_SITE_OTHER): Payer: Self-pay

## 2023-04-28 ENCOUNTER — Telehealth (INDEPENDENT_AMBULATORY_CARE_PROVIDER_SITE_OTHER): Payer: Self-pay | Admitting: Gastroenterology

## 2023-04-28 ENCOUNTER — Telehealth: Payer: Self-pay | Admitting: *Deleted

## 2023-04-28 ENCOUNTER — Encounter: Payer: Self-pay | Admitting: *Deleted

## 2023-04-28 DIAGNOSIS — D500 Iron deficiency anemia secondary to blood loss (chronic): Secondary | ICD-10-CM

## 2023-04-28 DIAGNOSIS — K922 Gastrointestinal hemorrhage, unspecified: Secondary | ICD-10-CM

## 2023-04-28 DIAGNOSIS — D62 Acute posthemorrhagic anemia: Secondary | ICD-10-CM

## 2023-04-28 DIAGNOSIS — D631 Anemia in chronic kidney disease: Secondary | ICD-10-CM

## 2023-04-28 DIAGNOSIS — I10 Essential (primary) hypertension: Secondary | ICD-10-CM

## 2023-04-28 DIAGNOSIS — K921 Melena: Secondary | ICD-10-CM

## 2023-04-28 NOTE — Telephone Encounter (Signed)
Pt wife called in to cancel EGD scheduled for 05/06/23. Pt wife states he was in the hospital and they completed an EGD

## 2023-04-28 NOTE — Telephone Encounter (Signed)
Patients wife called stated they wanted to stay with Dr Leward Quan scheduled a hospital f/u but needs blood work done - please advise

## 2023-04-28 NOTE — Transitions of Care (Post Inpatient/ED Visit) (Signed)
04/28/2023  Name: CONAGHER FAIRLESS MRN: [id number] DOB: 04-18-1946  Today's TOC FU Call Status: Today's TOC FU Call Status:: Successful TOC FU Call Competed TOC FU Call Complete Date: 04/28/23  Transition Care Management Follow-up Telephone Call Date of Discharge: 04/25/23 Discharge [facility] Type of Discharge: Inpatient Admission Primary Inpatient Discharge Diagnosis:: Acute Blood Loss anemia/ endoscopy How have you been since you were released from the hospital?: Better (per spouse/ caregiver: "He is doing better; I am watching him closely and supervising everything-- but overall, no problems doing fine and getting back to his normal self") Any questions or concerns?: No  Items Reviewed: Did you receive and understand the discharge instructions provided?: Yes (thoroughly reviewed with spouse who verbalizes good understanding of same) Medications obtained,verified, and reconciled?: Yes (Medications Reviewed) (Full medication reconciliation/ review completed; no concerns or discrepancies identified; confirmed patient obtained/ is taking all newly Rx'd medications as instructed; self-manages medications and denies questions/ concerns around medications today) Any new allergies since your discharge?: No Dietary orders reviewed?: Yes Type of Diet Ordered:: "Healthy" Do you have support at home?: Yes People in Home: spouse Name of Support/Comfort Primary Source: Reports essentially independent in self-care activities; spouse assists as/ if needed/ indicated  Medications Reviewed Today: Medications Reviewed Today     Reviewed by Michaela Corner, RN (Registered Nurse) on 04/28/23 at 1242  Med List Status: <None>   Medication Order Taking? Sig Documenting Provider Last Dose Status Informant  Alcohol Swabs (DROPSAFE ALCOHOL PREP) 70 % PADS [id number] Yes USE AS NEEDED TO CLEAN SKIN PRIOR TO FINGERPRICK AND INJECTION Elenore Paddy, NP Taking Active Spouse/Significant Other,  Pharmacy Records, Other, Self, Multiple Informants  allopurinol (ZYLOPRIM) 100 MG tablet [id number] Yes TAKE 1/2 TABLET EVERY OTHER DAY  Patient taking differently: Take 50 mg by mouth every other day.   Pollyann Savoy, MD Taking Active Spouse/Significant Other, Pharmacy Records, Other, Self, Multiple Informants  amLODipine (NORVASC) 10 MG tablet [id number] Yes Take 1 tablet (10 mg total) by mouth daily. Anabel Halon, MD Taking Active Spouse/Significant Other, Pharmacy Records, Other, Self, Multiple Informants  Blood Glucose Monitoring Suppl (ACCU-CHEK GUIDE) w/Device KIT [id number] Yes 1 Units by Does not apply route 2 (two) times daily. Wilson Singer, MD Taking Active Spouse/Significant Other, Pharmacy Records, Other, Self, Multiple Informants  calcitRIOL (ROCALTROL) 0.25 MCG capsule [id number] Yes Take 0.25 mcg by mouth. Margorie John, F [provider] Taking Active Spouse/Significant Other, Pharmacy Records, Other, Self, Multiple Informants  Colchicine 0.6 MG CAPS [id number] Yes Take 0.6 mg by mouth daily as needed (Gout). [provider] Taking Active Spouse/Significant Other, Pharmacy Records, Other, Self, Multiple Informants           Med Note Marilu Favre Apr 28, 2023 12:41 PM) 04/28/23: reports has not needed recently   Dupilumab (DUPIXENT) 300 MG/2ML SOPN [id number] Yes Inject 1 application into the skin every 14 (fourteen) days. Janalyn Harder, MD Taking Active Spouse/Significant Other, Pharmacy Records, Other, Self, Multiple Informants           Med Note Joanne Gavel, CRYSTAL L   Mon Mar 31, 2023  2:24 PM)    folic acid (FOLVITE) 400 MCG tablet [id number] Yes Take 400 mcg by mouth daily. [provider] Taking Active Spouse/Significant Other, Pharmacy Records, Other, Self, Multiple Informants  furosemide (LASIX) 20 MG tablet [id number] Yes Take 20 mg by mouth 2 (two) times daily. [provider] Taking Active Spouse/Significant Other, Pharmacy Records,  Other, Self, Multiple  Informants           Med Note Michaela Corner   Mon Apr 28, 2023 12:37 PM) 04/28/23:  During TOC call reports taking PRN for swelling in ankles   glucose blood (ACCU-CHEK GUIDE) test strip [id number] Yes Use as instructed Wilson Singer, MD Taking Active Spouse/Significant Other, Pharmacy Records, Other, Self, Multiple Informants  hydrALAZINE (APRESOLINE) 50 MG tablet [id number] Yes Take 100 mg by mouth 2 (two) times daily. [provider] Taking Active Spouse/Significant Other, Pharmacy Records, Other, Self, Multiple Informants  Lancets (ACCU-CHEK SOFT TOUCH) lancets [id number] Yes Use as instructed Wilson Singer, MD Taking Active Spouse/Significant Other, Pharmacy Records, Other, Self, Multiple Informants  leflunomide (ARAVA) 10 MG tablet [id number] Yes Take 1 tablet (10 mg total) by mouth daily. Pollyann Savoy, MD Taking Active Spouse/Significant Other, Pharmacy Records, Other, Self, Multiple Informants  nitroGLYCERIN (NITROSTAT) 0.4 MG SL tablet [id number] Yes Place 0.4 mg under the tongue every 5 (five) minutes as needed for chest pain.  [provider] Taking Active Spouse/Significant Other, Pharmacy Records, Other, Self, Multiple Informants  pantoprazole (PROTONIX) 40 MG tablet [id number] Yes Take 1 tablet (40 mg total) by mouth 2 (two) times daily. Raquel James, NP Taking Active Spouse/Significant Other, Pharmacy Records, Other, Self, Multiple Informants  potassium chloride SA (KLOR-CON M) 20 MEQ tablet [id number] Yes Take 20 mEq by mouth daily. [provider] Taking Active Spouse/Significant Other, Pharmacy Records, Other, Self, Multiple Informants  rosuvastatin (CRESTOR) 40 MG tablet [id number] Yes TAKE 1 TABLET EVERY DAY. STOP ATORVASTATIN  Patient taking differently: Take 40 mg by mouth daily.   Antoine Poche, MD Taking Active Spouse/Significant Other, Pharmacy Records, Other, Self, Multiple Informants  sucralfate (CARAFATE) 1  GM/10ML suspension [id number] Yes Take 10 mLs (1 g total) by mouth 4 (four) times daily -  with meals and at bedtime. Tyrone Nine, MD Taking Active   triamcinolone cream (KENALOG) 0.1 % [id number] Yes Apply 1 Application topically daily. Cleora Fleet, MD Taking Active Spouse/Significant Other, Pharmacy Records, Other, Self, Multiple Informants            Home Care and Equipment/Supplies: Were Home Health Services Ordered?: No Any new equipment or medical supplies ordered?: No  Functional Questionnaire: Do you need assistance with bathing/showering or dressing?: Yes (wife assisiting/ supervising post- recent hospital discharge on 04/25/23) Do you need assistance with meal preparation?: Yes Do you need assistance with eating?: No Do you have difficulty maintaining continence: No Do you need assistance with getting out of bed/getting out of a chair/moving?: No Do you have difficulty managing or taking your medications?: Yes  Follow up appointments reviewed: PCP Follow-up appointment confirmed?: Yes (care coordination outreach in real-time with scheduling care guide to successfully schedule hospital follow up PCP appointment) Date of PCP follow-up appointment?: 05/01/23 Follow-up Provider: PCP Specialist Hospital Follow-up appointment confirmed?: Yes Date of Specialist follow-up appointment?: 05/21/23 Follow-Up Specialty Provider:: ID provider Do you need transportation to your follow-up appointment?: No Do you understand care options if your condition(s) worsen?: Yes-patient verbalized understanding  SDOH Interventions Today    Flowsheet Row Most Recent Value  SDOH Interventions   Food Insecurity Interventions Intervention Not Indicated  Transportation Interventions Intervention Not Indicated  [normally drives self,  spouse assisiting post-recent hospitalization/ assists as needed]      TOC Interventions Today    Flowsheet Row Most Recent Value  TOC Interventions   TOC  Interventions Discussed/Reviewed TOC Interventions Discussed, Arranged PCP follow up within 7 days/Care Guide  scheduled      Interventions Today    Flowsheet Row Most Recent Value  Chronic Disease   Chronic disease during today's visit Chronic Kidney Disease/End Stage Renal Disease (ESRD), Other  [acute blood loss anemia]  General Interventions   General Interventions Discussed/Reviewed General Interventions Discussed, Doctor Visits, Durable Medical Equipment (DME)  Doctor Visits Discussed/Reviewed Doctor Visits Discussed, Specialist, PCP  Durable Medical Equipment (DME) Walker  PCP/Specialist Visits Compliance with follow-up visit  Nutrition Interventions   Nutrition Discussed/Reviewed Nutrition Discussed  Pharmacy Interventions   Pharmacy Dicussed/Reviewed Pharmacy Topics Discussed  [Full medication review with updating medication list in EHR per patient report]      Caryl Pina, RN, BSN, CCRN Alumnus RN CM Care Coordination/ Transition of Care- [facility] Care Management [phone]: direct office

## 2023-04-28 NOTE — Telephone Encounter (Signed)
I think he needs repeat CBC in one week

## 2023-04-28 NOTE — Telephone Encounter (Signed)
LMOM for pt to call office  

## 2023-04-28 NOTE — Telephone Encounter (Signed)
Patient wife made aware to have labs redrawn at [facility] on 05/05/2023. Orders placed in Epic for [facility].

## 2023-04-29 ENCOUNTER — Encounter (HOSPITAL_COMMUNITY): Payer: Medicare HMO

## 2023-04-30 ENCOUNTER — Encounter (HOSPITAL_COMMUNITY): Payer: Self-pay | Admitting: Gastroenterology

## 2023-05-01 ENCOUNTER — Ambulatory Visit (INDEPENDENT_AMBULATORY_CARE_PROVIDER_SITE_OTHER): Payer: Medicare HMO | Admitting: Family Medicine

## 2023-05-01 ENCOUNTER — Encounter: Payer: Self-pay | Admitting: Family Medicine

## 2023-05-01 ENCOUNTER — Encounter (HOSPITAL_COMMUNITY): Payer: Self-pay

## 2023-05-01 ENCOUNTER — Ambulatory Visit (HOSPITAL_COMMUNITY): Admit: 2023-05-01 | Payer: Medicare HMO | Admitting: Gastroenterology

## 2023-05-01 ENCOUNTER — Ambulatory Visit: Payer: Medicare HMO | Admitting: Dermatology

## 2023-05-01 VITALS — BP 130/62 | HR 68 | Ht 70.0 in | Wt 166.0 lb

## 2023-05-01 DIAGNOSIS — E878 Other disorders of electrolyte and fluid balance, not elsewhere classified: Secondary | ICD-10-CM | POA: Diagnosis not present

## 2023-05-01 DIAGNOSIS — K922 Gastrointestinal hemorrhage, unspecified: Secondary | ICD-10-CM | POA: Diagnosis not present

## 2023-05-01 DIAGNOSIS — Z8719 Personal history of other diseases of the digestive system: Secondary | ICD-10-CM | POA: Diagnosis not present

## 2023-05-01 SURGERY — ESOPHAGOGASTRODUODENOSCOPY (EGD) WITH PROPOFOL
Anesthesia: Monitor Anesthesia Care

## 2023-05-01 NOTE — Assessment & Plan Note (Signed)
Hospital follow up today CBC and BMP labs ordered.  Advise patient Do not drink alcohol and to follow iron rich diet foods.These include red meat, shellfish, poultry, and eggs.

## 2023-05-01 NOTE — Patient Instructions (Signed)
        Great to see you today.   If labs were collected, we will inform you of lab results once received either by echart message or telephone call.   - echart message- for normal results that have been seen by the patient already.   - telephone call: abnormal results or if patient has not viewed results in their echart.   - Please take medications as prescribed. - Follow up with your primary health provider if any health concerns arises. - If symptoms worsen please contact your primary care provider and/or visit the emergency department.  

## 2023-05-01 NOTE — Progress Notes (Signed)
Patient Office Visit   Subjective   Patient ID: Jacob Farmer, male    DOB: 12/30/1945  Age: 77 y.o. MRN: [id number]  CC:  Chief Complaint  Patient presents with   Follow-up    Patient is here for hospital f/u. Was in [facility] from 5/4-5/10 for GI bleed.     HPI Jacob Farmer 77 year old male, presents to the clinic for  hospital follow up for GI bleed. He  has a past medical history of CAD (coronary artery disease), CKD (chronic kidney disease) stage 4, GFR 15-29 ml/min (HCC), First degree heart block, History of gastric ulcer, History of kidney stones, History of MI (myocardial infarction), Hyperlipidemia, Hypertension, Jaundice, OA (osteoarthritis), Prediabetes, Prostate cancer (HCC), Psoriatic arthritis (HCC), RA (rheumatoid arthritis) (HCC), Sinus bradycardia, and Wears dentures.For the details of today's visit, please refer to assessment and plan.   HPI    Outpatient Encounter Medications as of 05/01/2023  Medication Sig   Alcohol Swabs (DROPSAFE ALCOHOL PREP) 70 % PADS USE AS NEEDED TO CLEAN SKIN PRIOR TO FINGERPRICK AND INJECTION   allopurinol (ZYLOPRIM) 100 MG tablet TAKE 1/2 TABLET EVERY OTHER DAY (Patient taking differently: Take 50 mg by mouth every other day.)   amLODipine (NORVASC) 10 MG tablet Take 1 tablet (10 mg total) by mouth daily.   Blood Glucose Monitoring Suppl (ACCU-CHEK GUIDE) w/Device KIT 1 Units by Does not apply route 2 (two) times daily.   calcitRIOL (ROCALTROL) 0.25 MCG capsule Take 0.25 mcg by mouth. M, W, F   Colchicine 0.6 MG CAPS Take 0.6 mg by mouth daily as needed (Gout).   Dupilumab (DUPIXENT) 300 MG/2ML SOPN Inject 1 application into the skin every 14 (fourteen) days.   folic acid (FOLVITE) 400 MCG tablet Take 400 mcg by mouth daily.   furosemide (LASIX) 20 MG tablet Take 20 mg by mouth 2 (two) times daily.   glucose blood (ACCU-CHEK GUIDE) test strip Use as instructed   hydrALAZINE (APRESOLINE) 50 MG tablet Take 100 mg by mouth 2 (two) times daily.    Lancets (ACCU-CHEK SOFT TOUCH) lancets Use as instructed   leflunomide (ARAVA) 10 MG tablet Take 1 tablet (10 mg total) by mouth daily.   nitroGLYCERIN (NITROSTAT) 0.4 MG SL tablet Place 0.4 mg under the tongue every 5 (five) minutes as needed for chest pain.    pantoprazole (PROTONIX) 40 MG tablet Take 1 tablet (40 mg total) by mouth 2 (two) times daily.   potassium chloride SA (KLOR-CON M) 20 MEQ tablet Take 20 mEq by mouth daily.   rosuvastatin (CRESTOR) 40 MG tablet TAKE 1 TABLET EVERY DAY. STOP ATORVASTATIN (Patient taking differently: Take 40 mg by mouth daily.)   sucralfate (CARAFATE) 1 GM/10ML suspension Take 10 mLs (1 g total) by mouth 4 (four) times daily -  with meals and at bedtime.   triamcinolone cream (KENALOG) 0.1 % Apply 1 Application topically daily.   No facility-administered encounter medications on file as of 05/01/2023.    Past Surgical History:  Procedure Laterality Date   BIOPSY  08/27/2019   Procedure: BIOPSY;  Surgeon: Malissa Hippo, MD;  [facility];  Service: Endoscopy;;  gastric   BIOPSY  04/03/2022   Procedure: BIOPSY;  Surgeon: Dolores Frame, MD;  [facility];  Service: Gastroenterology;;   BIOPSY  04/21/2023   Procedure: BIOPSY;  Surgeon: Lanelle Bal, DO;  [facility];  Service: Endoscopy;;   callus removal Left 09/13/2022   left foot  CARDIOVASCULAR STRESS TEST  2001  per Dr Dietrich Pates clinic note   distal anteroseptal ischemia,  normal LVF   COLONOSCOPY  last one 2006 (approx)   COLONOSCOPY WITH PROPOFOL N/A 04/03/2022   Procedure: COLONOSCOPY WITH PROPOFOL;  Surgeon: Dolores Frame, MD;  [facility];  Service: Gastroenterology;  Laterality: N/A;  945   CORONARY ANGIOPLASTY WITH STENT PLACEMENT  1999   DES x2  to CFX and RCA/  residual total occlusion LAD,  normal LVEF   ENTEROSCOPY N/A 04/24/2023   Procedure: ENTEROSCOPY;  Surgeon: Dolores Frame, MD;  [facility];  Service: Gastroenterology;  Laterality: N/A;   ESOPHAGOGASTRODUODENOSCOPY (EGD) WITH PROPOFOL N/A 08/27/2019   Procedure: ESOPHAGOGASTRODUODENOSCOPY (EGD) WITH PROPOFOL;  Surgeon: Malissa Hippo, MD;  [facility];  Service: Endoscopy;  Laterality: N/A;  10:10   ESOPHAGOGASTRODUODENOSCOPY (EGD) WITH PROPOFOL N/A 04/03/2022   Procedure: ESOPHAGOGASTRODUODENOSCOPY (EGD) WITH PROPOFOL;  Surgeon: Dolores Frame, MD;  [facility];  Service: Gastroenterology;  Laterality: N/A;   ESOPHAGOGASTRODUODENOSCOPY (EGD) WITH PROPOFOL N/A 02/16/2023   Procedure: ESOPHAGOGASTRODUODENOSCOPY (EGD) WITH PROPOFOL;  Surgeon: Dolores Frame, MD;  [facility];  Service: Gastroenterology;  Laterality: N/A;   ESOPHAGOGASTRODUODENOSCOPY (EGD) WITH PROPOFOL N/A 04/21/2023   Procedure: ESOPHAGOGASTRODUODENOSCOPY (EGD) WITH PROPOFOL;  Surgeon: Lanelle Bal, DO;  [facility];  Service: Endoscopy;  Laterality: N/A;   EYE SURGERY Bilateral    cataract   GIVENS CAPSULE STUDY N/A 04/22/2023   Procedure: GIVENS CAPSULE STUDY;  Surgeon: Dolores Frame, MD;  [facility];  Service: Gastroenterology;  Laterality: N/A;   HEMOSTASIS CLIP PLACEMENT  04/24/2023   Procedure: HEMOSTASIS CLIP PLACEMENT;  Surgeon: Dolores Frame, MD;  [facility];  Service: Gastroenterology;;   HOT HEMOSTASIS  04/03/2022   Procedure: HOT HEMOSTASIS (ARGON PLASMA COAGULATION/BICAP);  Surgeon: Marguerita Merles, Reuel Boom, MD;  [facility];  Service: Gastroenterology;;   HOT HEMOSTASIS  04/24/2023   Procedure: HOT HEMOSTASIS (ARGON PLASMA COAGULATION/BICAP);  Surgeon: Marguerita Merles, Reuel Boom, MD;  [facility];  Service: Gastroenterology;;   POLYPECTOMY  04/03/2022   Procedure: POLYPECTOMY;  Surgeon: Dolores Frame, MD;  [facility];  Service: Gastroenterology;;   POSTERIOR CERVICAL  FUSION/FORAMINOTOMY N/A 05/26/2017   Procedure: RIGHT C4-5 FORAMINOTOMY WITH EXCISION OF HERNIATED DISC;  Surgeon: Kerrin Champagne, MD;  Location: [facility] OR;  Service: Orthopedics;  Laterality: N/A;   RADIOACTIVE SEED IMPLANT N/A 01/11/2016   Procedure: RADIOACTIVE SEED IMPLANT/BRACHYTHERAPY IMPLANT;  Surgeon: Marcine Matar, MD;  Location: [facility];  Service: Urology;  Laterality: N/A;   73  seeds implanted no seeds founds in bladder   SCLEROTHERAPY  04/24/2023   Procedure: SCLEROTHERAPY;  Surgeon: Marguerita Merles, Reuel Boom, MD;  [facility];  Service: Gastroenterology;;   SUBMUCOSAL TATTOO INJECTION  04/24/2023   Procedure: SUBMUCOSAL TATTOO INJECTION;  Surgeon: Marguerita Merles, Reuel Boom, MD;  [facility];  Service: Gastroenterology;;    Review of Systems  Constitutional:  Negative for chills and fever.  Eyes:  Negative for blurred vision.  Respiratory:  Negative for shortness of breath.   Cardiovascular:  Negative for chest pain.  Gastrointestinal:  Negative for abdominal pain, blood in stool, constipation, diarrhea, heartburn, melena, nausea and vomiting.  Genitourinary:  Negative for dysuria.  Neurological:  Negative for dizziness and headaches.  Endo/Heme/Allergies:  Does not bruise/bleed easily.      Objective    BP 130/62  Pulse 68   Ht 5\' 10"  (1.778 m)   Wt 166 lb (75.3 kg)   SpO2 97%   BMI 23.82 kg/m   Physical Exam Vitals reviewed.  Constitutional:      General: He is not in acute distress.    Appearance: Normal appearance. He is not ill-appearing, toxic-appearing or diaphoretic.  HENT:     Head: Normocephalic.  Eyes:     General:        Right eye: No discharge.        Left eye: No discharge.     Conjunctiva/sclera: Conjunctivae normal.  Cardiovascular:     Rate and Rhythm: Normal rate.     Pulses: Normal pulses.     Heart sounds: Normal heart sounds.  Pulmonary:     Effort: Pulmonary effort is normal. No respiratory  distress.     Breath sounds: Normal breath sounds.  Abdominal:     General: Bowel sounds are normal.     Palpations: Abdomen is soft.     Tenderness: There is no abdominal tenderness. There is no guarding.  Musculoskeletal:        General: Normal range of motion.     Cervical back: Normal range of motion.  Skin:    General: Skin is warm and dry.     Capillary Refill: Capillary refill takes less than 2 seconds.  Neurological:     General: No focal deficit present.     Mental Status: He is alert and oriented to person, place, and time.     Coordination: Coordination normal.     Gait: Gait normal.  Psychiatric:        Mood and Affect: Mood normal.        Behavior: Behavior normal.       Assessment & Plan:  H/O: GI bleed -     CBC  Electrolyte imbalance -     Basic metabolic panel  Upper GI bleed Assessment & Plan: Hospital follow up today CBC and BMP labs ordered.  Advise patient Do not drink alcohol and to follow iron rich diet foods.These include red meat, shellfish, poultry, and eggs.     No follow-ups on file.   Cruzita Lederer Newman Nip, FNP

## 2023-05-02 LAB — CBC
Hematocrit: 31.7 % — ABNORMAL LOW (ref 37.5–51.0)
Hemoglobin: 10 g/dL — ABNORMAL LOW (ref 13.0–17.7)
MCH: 28.2 pg (ref 26.6–33.0)
MCHC: 31.5 g/dL (ref 31.5–35.7)
MCV: 90 fL (ref 79–97)
Platelets: 201 10*3/uL (ref 150–450)
RBC: 3.54 x10E6/uL — ABNORMAL LOW (ref 4.14–5.80)
RDW: 15.6 % — ABNORMAL HIGH (ref 11.6–15.4)
WBC: 7.3 10*3/uL (ref 3.4–10.8)

## 2023-05-02 LAB — BASIC METABOLIC PANEL
BUN/Creatinine Ratio: 9 — ABNORMAL LOW (ref 10–24)
BUN: 51 mg/dL — ABNORMAL HIGH (ref 8–27)
CO2: 17 mmol/L — ABNORMAL LOW (ref 20–29)
Calcium: 8.4 mg/dL — ABNORMAL LOW (ref 8.6–10.2)
Chloride: 102 mmol/L (ref 96–106)
Creatinine, Ser: 5.73 mg/dL — ABNORMAL HIGH (ref 0.76–1.27)
Glucose: 122 mg/dL — ABNORMAL HIGH (ref 70–99)
Potassium: 4 mmol/L (ref 3.5–5.2)
Sodium: 139 mmol/L (ref 134–144)
eGFR: 9 mL/min/{1.73_m2} — ABNORMAL LOW (ref >59)

## 2023-05-05 ENCOUNTER — Other Ambulatory Visit: Payer: Self-pay | Admitting: Physician Assistant

## 2023-05-05 ENCOUNTER — Telehealth: Payer: Self-pay | Admitting: Internal Medicine

## 2023-05-05 NOTE — Telephone Encounter (Signed)
Labs have not been reviewed by PCP yet.

## 2023-05-05 NOTE — Telephone Encounter (Signed)
Patient called in for lab results  ?

## 2023-05-05 NOTE — Telephone Encounter (Signed)
Last Fill: 04/10/2022  Labs: 05/01/2023 RBC 3.54 Hemoglobin 10.0 Hematocrit 31.7 RDW 15.6 Glucose 122 BUN 51 Creatinine 5.73 eGFR 9 BUN/Creatinine Ratio 9 CO2 17 Calcium 8.4  03/28/2023 Uric Acid 6.7  Next Visit: 06/11/2023  Last Visit: 01/09/2023  DX: Idiopathic chronic gout of multiple sites without tophus   Current Dose per office note 01/09/2023: allopurinol 50 mg every other day   Okay to refill Allopurinol?

## 2023-05-05 NOTE — Telephone Encounter (Signed)
It appears that the patient remains on low-dose Arava 10 mg daily and allopurinol 50 mg 1 tablet every other day.   GFR continues to trend down.  Patient is not scheduled for a follow-up visit with you until 06/11/23.   Should we advise patient to hold medications or continue therapy with close monitoring for now?

## 2023-05-07 ENCOUNTER — Telehealth: Payer: Self-pay

## 2023-05-07 MED ORDER — DUPIXENT 300 MG/2ML ~~LOC~~ SOAJ: 300.0000 mg | SUBCUTANEOUS | 2 refills | Status: DC

## 2023-05-07 NOTE — Telephone Encounter (Signed)
Patient's spouse called and request Dupixent RF. aw

## 2023-05-08 NOTE — Telephone Encounter (Signed)
Please see previous message and try to contact nephrology office.  Thank you.

## 2023-05-09 NOTE — Telephone Encounter (Signed)
Attempted to contact Dr. Wolfgang Phoenix and left a second message requesting a call back to discuss if patient can continue on these medications. Advised on message we need a call back ASAP.

## 2023-05-09 NOTE — Telephone Encounter (Signed)
Liborio Nixon with Dr. Lucio Edward office returned call and advised that Mr. Standard should stay on both medications.

## 2023-05-09 NOTE — Telephone Encounter (Signed)
Received a message from [facility] with [facility], she states Dr. Wolfgang Phoenix no longer works at that office. Reached out to patient and obtained correct contact information and spoke with Liborio Nixon directly and advised Dr. Corliss Skains would like Dr. Lucio Edward input on whether or not Ranon should continue the Arava and Allopurinol or not. She will call me with his response.

## 2023-05-13 ENCOUNTER — Other Ambulatory Visit: Payer: Self-pay | Admitting: *Deleted

## 2023-05-13 DIAGNOSIS — M0600 Rheumatoid arthritis without rheumatoid factor, unspecified site: Secondary | ICD-10-CM

## 2023-05-13 MED ORDER — LEFLUNOMIDE 10 MG PO TABS: 10.0000 mg | ORAL_TABLET | Freq: Every day | ORAL | 0 refills | Status: DC

## 2023-05-13 NOTE — Telephone Encounter (Signed)
I called patient, patient verbalized understanding. 

## 2023-05-13 NOTE — Telephone Encounter (Signed)
Last Fill: 01/02/2023  Labs: 05/01/2023 RBC 3.54, Hemoglobin 10.0, Hematocrit 31.7, RDW 15.6, Glucose 122, BUN 51, Creatinine 5.73, eGFR 9, BUN/Creatinine ration 9, CO2 17, Calcium 8.4   Next Visit: 06/11/2023  Last Visit: 01/09/2023  DX: Seronegative rheumatoid arthritis   Current Dose per office note 01/09/2023 : Arava 10 mg 1 tablet by mouth daily   Okay to refill Arava ?

## 2023-05-17 DIAGNOSIS — N186 End stage renal disease: Secondary | ICD-10-CM | POA: Diagnosis not present

## 2023-05-17 DIAGNOSIS — E1129 Type 2 diabetes mellitus with other diabetic kidney complication: Secondary | ICD-10-CM | POA: Diagnosis not present

## 2023-05-17 DIAGNOSIS — E8722 Chronic metabolic acidosis: Secondary | ICD-10-CM | POA: Diagnosis not present

## 2023-05-17 DIAGNOSIS — Z992 Dependence on renal dialysis: Secondary | ICD-10-CM | POA: Diagnosis not present

## 2023-05-17 DIAGNOSIS — N185 Chronic kidney disease, stage 5: Secondary | ICD-10-CM | POA: Diagnosis not present

## 2023-05-20 ENCOUNTER — Encounter: Payer: Self-pay | Admitting: Cardiology

## 2023-05-20 ENCOUNTER — Ambulatory Visit: Payer: Medicare HMO | Attending: Cardiology | Admitting: Cardiology

## 2023-05-20 VITALS — BP 150/80 | HR 84 | Ht 70.0 in | Wt 172.0 lb

## 2023-05-20 DIAGNOSIS — I495 Sick sinus syndrome: Secondary | ICD-10-CM

## 2023-05-20 DIAGNOSIS — K551 Chronic vascular disorders of intestine: Secondary | ICD-10-CM | POA: Diagnosis not present

## 2023-05-20 DIAGNOSIS — K922 Gastrointestinal hemorrhage, unspecified: Secondary | ICD-10-CM | POA: Diagnosis not present

## 2023-05-20 DIAGNOSIS — I2510 Atherosclerotic heart disease of native coronary artery without angina pectoris: Secondary | ICD-10-CM | POA: Diagnosis not present

## 2023-05-20 DIAGNOSIS — I10 Essential (primary) hypertension: Secondary | ICD-10-CM

## 2023-05-20 DIAGNOSIS — Z8546 Personal history of malignant neoplasm of prostate: Secondary | ICD-10-CM | POA: Diagnosis not present

## 2023-05-20 DIAGNOSIS — I4891 Unspecified atrial fibrillation: Secondary | ICD-10-CM

## 2023-05-20 DIAGNOSIS — N184 Chronic kidney disease, stage 4 (severe): Secondary | ICD-10-CM

## 2023-05-20 DIAGNOSIS — E1122 Type 2 diabetes mellitus with diabetic chronic kidney disease: Secondary | ICD-10-CM

## 2023-05-20 DIAGNOSIS — R0602 Shortness of breath: Secondary | ICD-10-CM

## 2023-05-20 NOTE — Patient Instructions (Signed)
Medication Instructions:  Your physician recommends that you continue on your current medications as directed. Please refer to the Current Medication list given to you today.  *If you need a refill on your cardiac medications before your next appointment, please call your pharmacy*   Lab Work: None If you have labs (blood work) drawn today and your tests are completely normal, you will receive your results only by: MyChart Message (if you have MyChart) OR A paper copy in the mail If you have any lab test that is abnormal or we need to change your treatment, we will call you to review the results.   Testing/Procedures: Your physician has requested that you have an echocardiogram. Echocardiography is a painless test that uses sound waves to create images of your heart. It provides your doctor with information about the size and shape of your heart and how well your heart's chambers and valves are working. This procedure takes approximately one hour. There are no restrictions for this procedure. Please do NOT wear cologne, perfume, aftershave, or lotions (deodorant is allowed). Please arrive 15 minutes prior to your appointment time.    Follow-Up: At [facility], you and your health needs are our priority.  As part of our continuing mission to provide you with exceptional heart care, we have created designated Provider Care Teams.  These Care Teams include your primary Cardiologist (physician) and Advanced Practice Providers (APPs -  Physician Assistants and Nurse Practitioners) who all work together to provide you with the care you need, when you need it.  We recommend signing up for the patient portal called "MyChart".  Sign up information is provided on this After Visit Summary.  MyChart is used to connect with patients for Virtual Visits (Telemedicine).  Patients are able to view lab/test results, encounter notes, upcoming appointments, etc.  Non-urgent messages can be sent to your  provider as well.   To learn more about what you can do with MyChart, go to [email or web address].    Your next appointment:   4 week(s)  Provider:   Dina Rich, MD    Other Instructions

## 2023-05-20 NOTE — Progress Notes (Signed)
Cardiology Office Note:   Date:  05/20/2023  ID:  Jacob Farmer, DOB 1946/01/20, MRN [id number] PCP: Anabel Halon, MD  [facility] Providers Cardiologist:  Dina Rich, MD    History of Present Illness:   Jacob Farmer is a 77 y.o. male with past medical history of coronary artery disease, paroxysmal atrial fibrillation, tachybradycardia syndrome, HFpEF, type 2 diabetes, essential hypertension, rheumatoid and osteoarthritis, CKD stage IV, and prostate cancer, who is here today for follow-up.  He had an acute myocardial infarction treated with tPA in 1990, 1999 he had stents placed to the circumflex and the RCA, residual total occlusion of the left anterior descending, normal ejection fraction, and stress nuclear in 2001 revealed distal anterior septal ischemia and normal LV function.  He was last seen in clinic 10/22/2022 by Dr. Wyline Mood.  EKG revealed that he was back in sinus rhythm and had not required any AV nodal agents.  Prior dizziness had improved off of [place].  Longstanding history of orthostatic hypotension with no recurrent symptoms.  He was hospitalized at [facility] on 04/19/2023 where he had recently had an outpatient blood work done with his nephrologist and his creatinine was noted to be significantly elevated at 4.71.  He was also noted to have acute blood loss anemia secondary to upper GI bleed/GI hemorrhage/AVM of the colon and stent being transfused 2 units of blood, remain n.p.o. at midnight, anticoagulant for paroxysmal atrial fibrillation was placed on hold, he was given IV fluids for his elevated serum creatinine.  He ended up receiving 2 additional units of packed red blood cells.  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  Enteroscopy performed on 5/9 revealed jejunal ulcers and an AVM which were treated with clips and  APC.  Mesenteric artery ultrasound revealed SMA stenosis for which the patient will follow-up with vascular surgery after discharge.  Hemoglobin had remained stable with no evidence of ongoing bleeding.  He had been cleared for discharge from GI and medical teams.  Eliquis had remained on hold per GI.  He was considered stable for discharge and was able to be discharged home on 04/25/2023.  He returns to clinic today accompanied by his wife.  He has complaints of dyspnea on exertion, two-pillow orthopnea, peripheral edema, and dry itchy skin.  He recently had labs that was done at his PCPs office but is yet to find out what those results are.  He states that he also has a follow-up with CT surgeon tomorrow and his nephrologist next week.  There was concern for worsening kidney function which placed him in the hospital last month.  Kidney function is still elevated as he arrived to the hospital with a serum creatinine of 4.9 and was found to have a hemoglobin of 4.9 which required 4 units of blood.  He remains off of Eliquis and was advised that did GI would advise him when he would be able to restart that medication.  He has been compliant with his other medications but is concerned about male his worsening shortness of breath.  ROS: 10 point review of systems has been completed and considered negative with exception of what is listed in the HPI  Studies Reviewed:    EKG: No new tracings have been completed today  Ultrasound mesenteric arteries 04/25/2023 IMPRESSION: 1. Elevated peak systolic velocity in the  mid superior mesenteric artery suspicious for greater than 70% stenosis. 2. 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 Recommend follow-up every 3 years. This recommendation follows ACR consensus guidelines: White Paper of the ACR Incidental Findings Committee II on Vascular Findings. J Am  Coll Radiol 2013; 10:789-794.  Risk Assessment/Calculations:     HYPERTENSION CONTROL Vitals:   05/20/23 1515 05/20/23 1530  BP: (!) 154/70 (!) 150/80    The patient's blood pressure is elevated above target today.  In order to address the patient's elevated BP: Blood pressure will be monitored at home to determine if medication changes need to be made.          CHA2DS2-VASc Score = 6   This indicates a 9.7% annual risk of stroke. The patient's score is based upon: CHF History: 1 HTN History: 1 Diabetes History: 1 Stroke History: 0 Vascular Disease History: 1 Age Score: 2 Gender Score: 0      Physical Exam:   VS:  BP (!) 150/80 (BP Location: Right Arm, Patient Position: Sitting, Cuff Size: Normal)   Pulse 84   Ht 5\' 10"  (1.778 m)   Wt 172 lb (78 kg)   SpO2 97%   BMI 24.68 kg/m    Wt Readings from Last 3 Encounters:  05/20/23 172 lb (78 kg)  05/01/23 166 lb (75.3 kg)  04/21/23 169 lb 12.1 oz (77 kg)     GEN: Well nourished, well developed in no acute distress NECK: No JVD; No carotid bruits CARDIAC: RRR, no murmurs, rubs, gallops RESPIRATORY:  Clear upper lobes with diminished bases to auscultation without rales, wheezing or rhonchi, respirations are unlabored at rest ABDOMEN: Soft, non-tender, non-distended EXTREMITIES:  1+ edema; No deformity   ASSESSMENT AND PLAN:   Recent GI bleed with a hemoglobin of 4.9.  Hospitalized scoped, received 4 units of packed red blood cells.  Follow-up hemoglobin of 10.  Is already scheduled for repeat blood work.  Shortness of breath and dyspnea on exertion with two-pillow orthopnea and peripheral edema.  Currently taking furosemide 20 mg daily versus twice daily as ordered.  After further discussion with patient and his wife and patient will try to take 1 furosemide in the morning and the second furosemide at lunch.  Echocardiogram ordered to reevaluate heart function.  Tachybradycardia/paroxysmal atrial fibrillation with his  A-fib been diagnosed in 11/2021.  He remains in sinus rhythm today.  He has not been on AV nodal blocking agents.  Is off of his apixaban due to recent GI bleed.  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  Based on ultrasound greater than 70%.  Thought was that the likely culprit for the jejunal ulcers.  Discussed with vascular surgery prior to discharge.  Patient states that he has follow-up appointments already made.  CKD stage IV which is worsened since hospital discharge according to blood work drawn by his PCP with serum creatinine of 5.73.  These labs have been sent to his nephrologist Dr. Wolfgang Phoenix.  Patient states that he has blood work and a follow-up appointment with nephrology next week.  Encouraged him to keep this appointment.  Further discussion patient states that he and his wife have had long discussions about hemodialysis and he is not interested in pursuing that modality of treatment at this time.  Coronary artery disease without angina.  Previously had been on apixaban and lieu of aspirin which has been stopped due to  recent GI bleed.  He is continued on rosuvastatin 40 mg daily.  Essential hypertension with blood pressure of 150/80 today.  He is continued on amlodipine, furosemide, and hydralazine.  Patient was advised to take his furosemide twice daily to help with some of his shortness of breath symptoms which will also help improve his blood pressure hopefully.  He has been encouraged to continue his monitor his pressure at home as well.  Type 2 diabetes with a last hemoglobin A1c of 5.3.  Continues to be managed by PCP.  History of prostate cancer with suspicion for recent recurrence.  He was originally diagnosed in 2016 status post brachytherapy in 2017.  Recent PSA rise in February 2024 to 7.49. Continues to follow up with Dr Retta Diones.  Disposition patient return to clinic to see MD/[facility] in 4 weeks or sooner for reevaluation of symptoms       Signed, Gennaro Lizotte, NP

## 2023-05-21 ENCOUNTER — Encounter: Payer: Self-pay | Admitting: Vascular Surgery

## 2023-05-21 ENCOUNTER — Ambulatory Visit: Payer: Medicare HMO | Admitting: Vascular Surgery

## 2023-05-21 VITALS — BP 163/78 | HR 80 | Temp 98.2°F | Resp 20 | Ht 70.0 in | Wt 170.0 lb

## 2023-05-21 DIAGNOSIS — K551 Chronic vascular disorders of intestine: Secondary | ICD-10-CM | POA: Diagnosis not present

## 2023-05-21 DIAGNOSIS — I5032 Chronic diastolic (congestive) heart failure: Secondary | ICD-10-CM | POA: Diagnosis not present

## 2023-05-21 DIAGNOSIS — D638 Anemia in other chronic diseases classified elsewhere: Secondary | ICD-10-CM | POA: Diagnosis not present

## 2023-05-21 DIAGNOSIS — N184 Chronic kidney disease, stage 4 (severe): Secondary | ICD-10-CM | POA: Diagnosis not present

## 2023-05-21 DIAGNOSIS — D509 Iron deficiency anemia, unspecified: Secondary | ICD-10-CM | POA: Diagnosis not present

## 2023-05-21 DIAGNOSIS — Z79899 Other long term (current) drug therapy: Secondary | ICD-10-CM | POA: Diagnosis not present

## 2023-05-21 DIAGNOSIS — N189 Chronic kidney disease, unspecified: Secondary | ICD-10-CM | POA: Diagnosis not present

## 2023-05-21 NOTE — Progress Notes (Signed)
Patient ID: Jacob Farmer, male   DOB: 01-25-1946, 77 y.o.   MRN: [id number]  Reason for Consult: New Patient (Initial Visit)   Referred by Anabel Halon, MD  Subjective:     HPI:  Jacob Farmer is a 77 y.o. male with   of esophageal ulceration recently admitted to [facility].  He also has advancing chronic kidney disease.  He states that his ulcers have been well treated now he does not notice any black tarry stools that he did before.  He has no difficulty eating he is free of pain he has lost some weight recently as he has been ill but he does not have any food fear and states that his bowels are now working normally.  Past Medical History:  Diagnosis Date   CAD (coronary artery disease)    Acute myocardial infarction treated with TPA in 1990; 1999-stents to circumflex and RCA; residual total occlusion of the left anterior descending; normal ejection fraction; stress nuclear in 2001-distal anteroseptal ischemia; normal LV function   CKD (chronic kidney disease) stage 4, GFR 15-29 ml/min (HCC)    First degree heart block    History of gastric ulcer    History of kidney stones    History of MI (myocardial infarction)    1990-  treated w/ TPA   Hyperlipidemia    Hypertension    Jaundice    OA (osteoarthritis)    Prediabetes    Prostate cancer (HCC)    Stage T1c , Gleason 3+4,  PSA 4.7,  vol 24cc--  scheduled for radiative seed implants   Psoriatic arthritis (HCC)    RA (rheumatoid arthritis) (HCC)    Dr. Beatrix Fetters   Sinus bradycardia    Wears dentures    Family History  Problem Relation Age of Onset   Heart attack Mother    Coronary artery disease Father    Ulcerative colitis Father    Ulcers Father    Breast cancer Sister    COPD Brother    Pancreatic cancer Brother    Healthy Sister    Diabetes Brother    Cirrhosis Son    Seizures Son    Past Surgical History:  Procedure Laterality Date   BIOPSY  08/27/2019   Procedure: BIOPSY;  Surgeon: Malissa Hippo, MD;  [facility];  Service: Endoscopy;;  gastric   BIOPSY  04/03/2022   Procedure: BIOPSY;  Surgeon: Dolores Frame, MD;  [facility];  Service: Gastroenterology;;   BIOPSY  04/21/2023   Procedure: BIOPSY;  Surgeon: Lanelle Bal, DO;  [facility];  Service: Endoscopy;;   callus removal Left 09/13/2022   left foot   CARDIOVASCULAR STRESS TEST  2001  per Dr Dietrich Pates clinic note   distal anteroseptal ischemia,  normal LVF   COLONOSCOPY  last one 2006 (approx)   COLONOSCOPY WITH PROPOFOL N/A 04/03/2022   Procedure: COLONOSCOPY WITH PROPOFOL;  Surgeon: Dolores Frame, MD;  [facility];  Service: Gastroenterology;  Laterality: N/A;  945   CORONARY ANGIOPLASTY WITH STENT PLACEMENT  1999   DES x2  to CFX and RCA/  residual total occlusion LAD,  normal LVEF   ENTEROSCOPY N/A 04/24/2023   Procedure: ENTEROSCOPY;  Surgeon: Dolores Frame, MD;  [facility];  Service: Gastroenterology;  Laterality: N/A;   ESOPHAGOGASTRODUODENOSCOPY (EGD) WITH PROPOFOL N/A 08/27/2019   Procedure: ESOPHAGOGASTRODUODENOSCOPY (EGD) WITH PROPOFOL;  Surgeon: Malissa Hippo, MD;  [facility];  Service: Endoscopy;  Laterality: N/A;  10:10   ESOPHAGOGASTRODUODENOSCOPY (EGD) WITH PROPOFOL N/A 04/03/2022   Procedure: ESOPHAGOGASTRODUODENOSCOPY (EGD) WITH PROPOFOL;  Surgeon: Dolores Frame, MD;  [facility];  Service: Gastroenterology;  Laterality: N/A;   ESOPHAGOGASTRODUODENOSCOPY (EGD) WITH PROPOFOL N/A 02/16/2023   Procedure: ESOPHAGOGASTRODUODENOSCOPY (EGD) WITH PROPOFOL;  Surgeon: Dolores Frame, MD;  [facility];  Service: Gastroenterology;  Laterality: N/A;   ESOPHAGOGASTRODUODENOSCOPY (EGD) WITH PROPOFOL N/A 04/21/2023   Procedure: ESOPHAGOGASTRODUODENOSCOPY (EGD) WITH PROPOFOL;  Surgeon: Lanelle Bal, DO;  [facility];  Service: Endoscopy;  Laterality:  N/A;   EYE SURGERY Bilateral    cataract   GIVENS CAPSULE STUDY N/A 04/22/2023   Procedure: GIVENS CAPSULE STUDY;  Surgeon: Dolores Frame, MD;  [facility];  Service: Gastroenterology;  Laterality: N/A;   HEMOSTASIS CLIP PLACEMENT  04/24/2023   Procedure: HEMOSTASIS CLIP PLACEMENT;  Surgeon: Dolores Frame, MD;  [facility];  Service: Gastroenterology;;   HOT HEMOSTASIS  04/03/2022   Procedure: HOT HEMOSTASIS (ARGON PLASMA COAGULATION/BICAP);  Surgeon: Marguerita Merles, Reuel Boom, MD;  [facility];  Service: Gastroenterology;;   HOT HEMOSTASIS  04/24/2023   Procedure: HOT HEMOSTASIS (ARGON PLASMA COAGULATION/BICAP);  Surgeon: Marguerita Merles, Reuel Boom, MD;  [facility];  Service: Gastroenterology;;   POLYPECTOMY  04/03/2022   Procedure: POLYPECTOMY;  Surgeon: Dolores Frame, MD;  [facility];  Service: Gastroenterology;;   POSTERIOR CERVICAL FUSION/FORAMINOTOMY N/A 05/26/2017   Procedure: RIGHT C4-5 FORAMINOTOMY WITH EXCISION OF HERNIATED DISC;  Surgeon: Kerrin Champagne, MD;  Location[facility];  Service: Orthopedics;  Laterality: N/A;   RADIOACTIVE SEED IMPLANT N/A 01/11/2016   Procedure: RADIOACTIVE SEED IMPLANT/BRACHYTHERAPY IMPLANT;  Surgeon: Marcine Matar, MD;  Location: [facility];  Service: Urology;  Laterality: N/A;   73  seeds implanted no seeds founds in bladder   SCLEROTHERAPY  04/24/2023   Procedure: SCLEROTHERAPY;  Surgeon: Marguerita Merles, Reuel Boom, MD;  [facility];  Service: Gastroenterology;;   SUBMUCOSAL TATTOO INJECTION  04/24/2023   Procedure: SUBMUCOSAL TATTOO INJECTION;  Surgeon: Marguerita Merles, Reuel Boom, MD;  [facility];  Service: Gastroenterology;;    Short Social History:  Social History   Tobacco Use   Smoking status: Former    Packs/day: 1.00    Years: 30.00    Additional pack years: 0.00    Total pack years: 30.00    Types: Cigarettes     Quit date: 05/21/1989    Years since quitting: 34.0    Passive exposure: Never   Smokeless tobacco: Never  Substance Use Topics   Alcohol use: Not Currently    Comment: 1 beer and 1 mix drink per day    No Known Allergies  Current Outpatient Medications  Medication Sig Dispense Refill   allopurinol (ZYLOPRIM) 100 MG tablet TAKE 1/2 TABLET EVERY OTHER DAY 23 tablet 0   amLODipine (NORVASC) 10 MG tablet Take 1 tablet (10 mg total) by mouth daily. 90 tablet 3   calcitRIOL (ROCALTROL) 0.25 MCG capsule Take 0.25 mcg by mouth. M, W, F     Colchicine 0.6 MG CAPS Take 0.6 mg by mouth daily as needed (Gout).     Dupilumab (DUPIXENT) 300 MG/2ML SOPN Inject 300 mg into the skin every 14 (fourteen) days. Starting at day 15 for maintenance. 4 mL 2   folic acid (FOLVITE) 400 MCG tablet Take 400 mcg by mouth daily.  furosemide (LASIX) 20 MG tablet Take 20 mg by mouth 2 (two) times daily.     hydrALAZINE (APRESOLINE) 50 MG tablet Take 100 mg by mouth 2 (two) times daily.     leflunomide (ARAVA) 10 MG tablet Take 1 tablet (10 mg total) by mouth daily. 90 tablet 0   nitroGLYCERIN (NITROSTAT) 0.4 MG SL tablet Place 0.4 mg under the tongue every 5 (five) minutes as needed for chest pain.      pantoprazole (PROTONIX) 40 MG tablet Take 1 tablet (40 mg total) by mouth 2 (two) times daily. 180 tablet 1   potassium chloride SA (KLOR-CON M) 20 MEQ tablet Take 20 mEq by mouth daily.     rosuvastatin (CRESTOR) 40 MG tablet TAKE 1 TABLET EVERY DAY. STOP ATORVASTATIN (Patient taking differently: Take 40 mg by mouth daily.) 90 tablet 3   sucralfate (CARAFATE) 1 GM/10ML suspension Take 10 mLs (1 g total) by mouth 4 (four) times daily -  with meals and at bedtime. 420 mL 0   triamcinolone cream (KENALOG) 0.1 % Apply 1 Application topically daily.     No current facility-administered medications for this visit.    Review of Systems  Constitutional:  Constitutional negative. HENT: HENT negative.  Eyes: Eyes  negative.  Respiratory: Positive for shortness of breath.  Cardiovascular: Positive for leg swelling.  GI: Gastrointestinal negative.  Musculoskeletal: Musculoskeletal negative.  Skin: Skin negative.  Neurological: Neurological negative. Hematologic: Hematologic/lymphatic negative.  Psychiatric: Psychiatric negative.        Objective:  Objective   Vitals:   05/21/23 1049  BP: (!) 163/78  Pulse: 80  Resp: 20  Temp: 98.2 F (36.8 C)  SpO2: 90%     Physical Exam HENT:     Head: Normocephalic.     Nose: Nose normal.  Eyes:     Pupils: Pupils are equal, round, and reactive to light.  Cardiovascular:     Rate and Rhythm: Normal rate.  Abdominal:     General: Abdomen is flat.     Palpations: Abdomen is soft.  Musculoskeletal:     Right lower leg: Edema present.     Left lower leg: Edema present.  Skin:    Capillary Refill: Capillary refill takes less than 2 seconds.  Neurological:     Mental Status: He is alert.  Psychiatric:        Mood and Affect: Mood normal.        Thought Content: Thought content normal.     Data: Mesenteric Duplex IMPRESSION: 1. 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 2. 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 Recommend follow-up every 3 years. This recommendation follows ACR consensus guidelines: White Paper of the ACR Incidental Findings Committee II on Vascular Findings. J Am Coll Radiol 2013; 10:789-794.     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  Appears asymptomatic without chronic mesenteric ischemia.  His greatest issue now is worsening chronic kidney disease with significant edema of bilateral  lower extremities and shortness of breath and very short distance walking.  From a mesenteric standpoint he can follow-up with [place] in 1 year with repeat duplex.  The patient elects for dialysis he will need consideration for access prior  to that 1 year we will be happy to see him back.     Maeola Harman MD [facility]

## 2023-05-22 ENCOUNTER — Other Ambulatory Visit: Payer: Self-pay

## 2023-05-22 DIAGNOSIS — K551 Chronic vascular disorders of intestine: Secondary | ICD-10-CM

## 2023-05-22 LAB — LAB REPORT - SCANNED
Creatinine, POC: 40.4 mg/dL
EGFR: 7

## 2023-05-28 DIAGNOSIS — E1129 Type 2 diabetes mellitus with other diabetic kidney complication: Secondary | ICD-10-CM | POA: Diagnosis not present

## 2023-05-28 DIAGNOSIS — E8722 Chronic metabolic acidosis: Secondary | ICD-10-CM | POA: Diagnosis not present

## 2023-05-28 DIAGNOSIS — N185 Chronic kidney disease, stage 5: Secondary | ICD-10-CM | POA: Diagnosis not present

## 2023-05-28 NOTE — Progress Notes (Deleted)
Office Visit Note  Patient: Jacob Farmer             Date of Birth: 29-Dec-1945           MRN: [id number]             PCP: Anabel Halon, MD Referring: Anabel Halon, MD Visit Date: 06/11/2023 Occupation: @GUAROCC @  Subjective:  No chief complaint on file.   History of Present Illness: Jacob Farmer is a 77 y.o. male ***     Activities of Daily Living:  Patient reports morning stiffness for *** {minute/hour:19697}.   Patient {ACTIONS;DENIES/REPORTS:21021675::"Denies"} nocturnal pain.  Difficulty dressing/grooming: {ACTIONS;DENIES/REPORTS:21021675::"Denies"} Difficulty climbing stairs: {ACTIONS;DENIES/REPORTS:21021675::"Denies"} Difficulty getting out of chair: {ACTIONS;DENIES/REPORTS:21021675::"Denies"} Difficulty using hands for taps, buttons, cutlery, and/or writing: {ACTIONS;DENIES/REPORTS:21021675::"Denies"}  No Rheumatology ROS completed.   PMFS History:  Patient Active Problem List   Diagnosis Date Noted   Melena 04/23/2023   Acute blood loss anemia 04/19/2023   Anemia due to stage 4 chronic kidney disease (HCC) 03/25/2023   Iron deficiency anemia 03/25/2023   Acute GI bleeding 02/26/2023   Hospital discharge follow-up 02/26/2023   ABLA (acute blood loss anemia) 02/26/2023   Ulcer of esophagus without bleeding 02/16/2023   Upper GI bleed 02/13/2023   Acquired thrombophilia (HCC) 02/13/2023   Generalized weakness 02/13/2023   Acute bronchitis 02/13/2023   Drug-induced liver injury 02/08/2023   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   Gastritis and gastroduodenitis 02/06/2023   Bleeding from right ear 12/25/2022   Chronic heart failure with preserved ejection fraction (HCC) 07/22/2022   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   Dizziness 04/09/2022   Encounter for general adult medical examination with abnormal findings 01/25/2022   Paroxysmal atrial fibrillation (HCC) 01/22/2022   Allergic dermatitis 09/12/2021   Herniated  cervical disc 05/26/2017    Class: Acute   Cervical spinal stenosis 05/26/2017   Primary osteoarthritis of both hands 02/04/2017   History of gout 02/04/2017   DDD (degenerative disc disease), lumbar 02/04/2017   High risk medication use 01/28/2017   History of prostate cancer 01/28/2017   Chronic kidney disease, stage 4 (severe) (HCC) 12/27/2011   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   Rheumatoid arthritis (HCC) 01/04/2011   SINUS BRADYCARDIA 01/12/2010   Coronary artery disease involving native coronary artery of native heart without angina pectoris 01/12/2010   Hyperlipidemia 01/08/2010   Essential hypertension 01/08/2010    Past Medical History:  Diagnosis Date   CAD (coronary artery disease)    Acute myocardial infarction treated with TPA in 1990; 1999-stents to circumflex and RCA; residual total occlusion of the left anterior descending; normal ejection fraction; stress nuclear in 2001-distal anteroseptal ischemia; normal LV function   CKD (chronic kidney disease) stage 4, GFR 15-29 ml/min (HCC)    First degree heart block    History of gastric ulcer    History of kidney stones    History of MI (myocardial infarction)    1990-  treated w/ TPA   Hyperlipidemia    Hypertension    Jaundice    OA (osteoarthritis)    Prediabetes    Prostate cancer (HCC)    Stage T1c , Gleason 3+4,  PSA 4.7,  vol 24cc--  scheduled for radiative seed implants   Psoriatic arthritis (HCC)    RA (rheumatoid arthritis) (HCC)    Dr. Beatrix Fetters   Sinus bradycardia    Wears dentures     Family History  Problem Relation Age of Onset  Heart attack Mother    Coronary artery disease Father    Ulcerative colitis Father    Ulcers Father    Breast cancer Sister    COPD Brother    Pancreatic cancer Brother    Healthy Sister    Diabetes Brother    Cirrhosis Son    Seizures Son    Past Surgical History:  Procedure Laterality Date   BIOPSY  08/27/2019    Procedure: BIOPSY;  Surgeon: Malissa Hippo, MD;  [facility];  Service: Endoscopy;;  gastric   BIOPSY  04/03/2022   Procedure: BIOPSY;  Surgeon: Dolores Frame, MD;  [facility];  Service: Gastroenterology;;   BIOPSY  04/21/2023   Procedure: BIOPSY;  Surgeon: Lanelle Bal, DO;  [facility];  Service: Endoscopy;;   callus removal Left 09/13/2022   left foot   CARDIOVASCULAR STRESS TEST  2001  per Dr Dietrich Pates clinic note   distal anteroseptal ischemia,  normal LVF   COLONOSCOPY  last one 2006 (approx)   COLONOSCOPY WITH PROPOFOL N/A 04/03/2022   Procedure: COLONOSCOPY WITH PROPOFOL;  Surgeon: Dolores Frame, MD;  [facility];  Service: Gastroenterology;  Laterality: N/A;  945   CORONARY ANGIOPLASTY WITH STENT PLACEMENT  1999   DES x2  to CFX and RCA/  residual total occlusion LAD,  normal LVEF   ENTEROSCOPY N/A 04/24/2023   Procedure: ENTEROSCOPY;  Surgeon: Dolores Frame, MD;  [facility];  Service: Gastroenterology;  Laterality: N/A;   ESOPHAGOGASTRODUODENOSCOPY (EGD) WITH PROPOFOL N/A 08/27/2019   Procedure: ESOPHAGOGASTRODUODENOSCOPY (EGD) WITH PROPOFOL;  Surgeon: Malissa Hippo, MD;  [facility];  Service: Endoscopy;  Laterality: N/A;  10:10   ESOPHAGOGASTRODUODENOSCOPY (EGD) WITH PROPOFOL N/A 04/03/2022   Procedure: ESOPHAGOGASTRODUODENOSCOPY (EGD) WITH PROPOFOL;  Surgeon: Dolores Frame, MD;  [facility];  Service: Gastroenterology;  Laterality: N/A;   ESOPHAGOGASTRODUODENOSCOPY (EGD) WITH PROPOFOL N/A 02/16/2023   Procedure: ESOPHAGOGASTRODUODENOSCOPY (EGD) WITH PROPOFOL;  Surgeon: Dolores Frame, MD;  [facility];  Service: Gastroenterology;  Laterality: N/A;   ESOPHAGOGASTRODUODENOSCOPY (EGD) WITH PROPOFOL N/A 04/21/2023   Procedure: ESOPHAGOGASTRODUODENOSCOPY (EGD) WITH PROPOFOL;  Surgeon: Lanelle Bal, DO;  [facility];  Service: Endoscopy;  Laterality: N/A;   EYE SURGERY Bilateral    cataract   GIVENS CAPSULE STUDY N/A 04/22/2023   Procedure: GIVENS CAPSULE STUDY;  Surgeon: Dolores Frame, MD;  [facility];  Service: Gastroenterology;  Laterality: N/A;   HEMOSTASIS CLIP PLACEMENT  04/24/2023   Procedure: HEMOSTASIS CLIP PLACEMENT;  Surgeon: Dolores Frame, MD;  [facility];  Service: Gastroenterology;;   HOT HEMOSTASIS  04/03/2022   Procedure: HOT HEMOSTASIS (ARGON PLASMA COAGULATION/BICAP);  Surgeon: Marguerita Merles, Reuel Boom, MD;  [facility];  Service: Gastroenterology;;   HOT HEMOSTASIS  04/24/2023   Procedure: HOT HEMOSTASIS (ARGON PLASMA COAGULATION/BICAP);  Surgeon: Marguerita Merles, Reuel Boom, MD;  [facility];  Service: Gastroenterology;;   POLYPECTOMY  04/03/2022   Procedure: POLYPECTOMY;  Surgeon: Dolores Frame, MD;  [facility];  Service: Gastroenterology;;   POSTERIOR CERVICAL FUSION/FORAMINOTOMY N/A 05/26/2017   Procedure: RIGHT C4-5 FORAMINOTOMY WITH EXCISION OF HERNIATED DISC;  Surgeon: Kerrin Champagne, MD;  Location[facility];  Service: Orthopedics;  Laterality: N/A;   RADIOACTIVE SEED IMPLANT N/A 01/11/2016   Procedure: RADIOACTIVE SEED IMPLANT/BRACHYTHERAPY IMPLANT;  Surgeon: Marcine Matar, MD;  Location: [facility];  Service:  Urology;  Laterality: N/A;   73  seeds implanted no seeds founds in bladder   SCLEROTHERAPY  04/24/2023   Procedure: SCLEROTHERAPY;  Surgeon: Dolores Frame, MD;  [facility];  Service: Gastroenterology;;   SUBMUCOSAL TATTOO INJECTION  04/24/2023   Procedure: SUBMUCOSAL TATTOO INJECTION;  Surgeon: Dolores Frame, MD;  [facility];  Service: Gastroenterology;;   Social History   Social History Narrative   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   Immunization History  Administered Date(s) Administered   Fluad  Quad(high Dose 65+) 09/10/2022   Influenza, High Dose Seasonal PF 09/12/2017, 08/28/2019, 08/28/2019   Influenza-Unspecified 08/08/2020, 08/24/2021   PFIZER(Purple Top)SARS-COV-2 Vaccination 01/22/2020, 02/12/2020, 08/15/2020, 01/19/2021   PNEUMOCOCCAL CONJUGATE-20 01/28/2022   Pneumococcal Polysaccharide-23 08/16/2009   Td 10/26/2021   Tdap 09/28/2021   Zoster Recombinat (Shingrix) 07/29/2019, 09/28/2021     Objective: Vital Signs: There were no vitals taken for this visit.   Physical Exam   Musculoskeletal Exam: ***  CDAI Exam: CDAI Score: -- Patient Global: --; Provider Global: -- Swollen: --; Tender: -- Joint Exam 06/11/2023   No joint exam has been documented for this visit   There is currently no information documented on the homunculus. Go to the Rheumatology activity and complete the homunculus joint exam.  Investigation: No additional findings.  Imaging: No results found.  Recent Labs: Lab Results  Component Value Date   WBC 7.3 05/01/2023   HGB 10.0 (L) 05/01/2023   PLT 201 05/01/2023   NA 139 05/01/2023   K 4.0 05/01/2023   CL 102 05/01/2023   CO2 17 (L) 05/01/2023   GLUCOSE 122 (H) 05/01/2023   BUN 51 (H) 05/01/2023   CREATININE 5.73 (H) 05/01/2023   BILITOT 0.5 04/19/2023   ALKPHOS 95 04/19/2023   AST 28 04/19/2023   ALT 34 04/19/2023   PROT 6.2 (L) 04/19/2023   ALBUMIN 2.4 (L) 04/25/2023   CALCIUM 8.4 (L) 05/01/2023   GFRAA 27 (L) 06/14/2021    Speciality Comments: No specialty comments available.  Procedures:  No procedures performed Allergies: Patient has no known allergies.   Assessment / Plan:     Visit Diagnoses: No diagnosis found.  Orders: No orders of the defined types were placed in this encounter.  No orders of the defined types were placed in this encounter.   Face-to-face time spent with patient was *** minutes. Greater than 50% of time was spent in counseling and coordination of care.  Follow-Up Instructions: No  follow-ups on file.   Ellen Henri, CMA  Note - This record has been created using Animal nutritionist.  Chart creation errors have been sought, but may not always  have been located. Such creation errors do not reflect on  the standard of medical care.

## 2023-05-29 ENCOUNTER — Other Ambulatory Visit (HOSPITAL_COMMUNITY): Payer: Self-pay | Admitting: Nephrology

## 2023-05-29 DIAGNOSIS — D649 Anemia, unspecified: Secondary | ICD-10-CM | POA: Diagnosis not present

## 2023-05-29 DIAGNOSIS — N2581 Secondary hyperparathyroidism of renal origin: Secondary | ICD-10-CM | POA: Diagnosis not present

## 2023-05-29 DIAGNOSIS — E1129 Type 2 diabetes mellitus with other diabetic kidney complication: Secondary | ICD-10-CM | POA: Diagnosis not present

## 2023-05-29 DIAGNOSIS — I5032 Chronic diastolic (congestive) heart failure: Secondary | ICD-10-CM | POA: Diagnosis not present

## 2023-05-29 DIAGNOSIS — N170 Acute kidney failure with tubular necrosis: Secondary | ICD-10-CM | POA: Diagnosis not present

## 2023-05-29 DIAGNOSIS — N185 Chronic kidney disease, stage 5: Secondary | ICD-10-CM | POA: Diagnosis not present

## 2023-05-29 DIAGNOSIS — N184 Chronic kidney disease, stage 4 (severe): Secondary | ICD-10-CM | POA: Diagnosis not present

## 2023-05-29 DIAGNOSIS — Z992 Dependence on renal dialysis: Secondary | ICD-10-CM

## 2023-05-29 DIAGNOSIS — N189 Chronic kidney disease, unspecified: Secondary | ICD-10-CM | POA: Diagnosis not present

## 2023-05-30 ENCOUNTER — Other Ambulatory Visit (HOSPITAL_COMMUNITY): Payer: Self-pay | Admitting: Nephrology

## 2023-05-30 ENCOUNTER — Other Ambulatory Visit: Payer: Self-pay

## 2023-05-30 ENCOUNTER — Encounter (HOSPITAL_COMMUNITY): Payer: Self-pay

## 2023-05-30 ENCOUNTER — Ambulatory Visit (HOSPITAL_COMMUNITY)
Admission: RE | Admit: 2023-05-30 | Discharge: 2023-05-30 | Disposition: A | Discharge status: Home / Self Care | Payer: Medicare HMO | Source: Ambulatory Visit | Attending: Nephrology | Admitting: Nephrology

## 2023-05-30 ENCOUNTER — Other Ambulatory Visit (HOSPITAL_COMMUNITY): Payer: Self-pay | Admitting: Student

## 2023-05-30 DIAGNOSIS — N186 End stage renal disease: Secondary | ICD-10-CM | POA: Diagnosis not present

## 2023-05-30 DIAGNOSIS — I120 Hypertensive chronic kidney disease with stage 5 chronic kidney disease or end stage renal disease: Secondary | ICD-10-CM | POA: Diagnosis not present

## 2023-05-30 DIAGNOSIS — Z4901 Encounter for fitting and adjustment of extracorporeal dialysis catheter: Secondary | ICD-10-CM | POA: Diagnosis not present

## 2023-05-30 DIAGNOSIS — Z87891 Personal history of nicotine dependence: Secondary | ICD-10-CM | POA: Insufficient documentation

## 2023-05-30 HISTORY — PX: IR FLUORO GUIDE CV LINE RIGHT: IMG2283

## 2023-05-30 HISTORY — PX: IR US GUIDE VASC ACCESS RIGHT: IMG2390

## 2023-05-30 MED ORDER — LIDOCAINE-EPINEPHRINE 1 %-1:100000 IJ SOLN
INTRAMUSCULAR | Status: AC
  Filled 2023-05-30: qty 1

## 2023-05-30 MED ORDER — HEPARIN SODIUM (PORCINE) 1000 UNIT/ML IJ SOLN
INTRAMUSCULAR | Status: AC
  Filled 2023-05-30: qty 10

## 2023-05-30 MED ORDER — GELATIN ABSORBABLE 12-7 MM EX MISC
CUTANEOUS | Status: AC
  Filled 2023-05-30: qty 1

## 2023-05-30 MED ORDER — MIDAZOLAM HCL 2 MG/2ML IJ SOLN
INTRAMUSCULAR | Status: AC
  Filled 2023-05-30: qty 2

## 2023-05-30 MED ORDER — CEFAZOLIN SODIUM-DEXTROSE 2-4 GM/100ML-% IV SOLN
INTRAVENOUS | Status: AC
  Filled 2023-05-30: qty 100

## 2023-05-30 MED ORDER — LIDOCAINE HCL 1 % IJ SOLN
INTRAMUSCULAR | Status: AC
  Filled 2023-05-30: qty 20

## 2023-05-30 MED ORDER — FENTANYL CITRATE (PF) 100 MCG/2ML IJ SOLN
INTRAMUSCULAR | Status: AC
  Filled 2023-05-30: qty 2

## 2023-05-30 MED ORDER — FENTANYL CITRATE (PF) 100 MCG/2ML IJ SOLN
INTRAMUSCULAR | Status: AC | PRN
  Administered 2023-05-30: 50 ug via INTRAVENOUS

## 2023-05-30 MED ORDER — MIDAZOLAM HCL 2 MG/2ML IJ SOLN
INTRAMUSCULAR | Status: AC | PRN
  Administered 2023-05-30: 1 mg via INTRAVENOUS

## 2023-05-30 MED ORDER — CEFAZOLIN SODIUM-DEXTROSE 2-4 GM/100ML-% IV SOLN
2.0000 g | INTRAVENOUS | Status: AC
  Administered 2023-05-30: 2 g via INTRAVENOUS

## 2023-05-30 MED ORDER — SODIUM CHLORIDE 0.9 % IV SOLN: INTRAVENOUS | Status: DC

## 2023-05-30 NOTE — Procedures (Signed)
Interventional Radiology Procedure Note  Procedure: Placement of a right IJ approach triple lumen DC cath. 23 cm tip to cuff.     Tip is positioned at the superior cavoatrial junction and catheter is ready for immediate use.  Complications: None Recommendations:  - Ok to use - Do not submerge - Routine line care  - advance diet & DC 1 hr  Signed,  Yvone Neu. Loreta Ave, DO

## 2023-05-30 NOTE — H&P (Signed)
Chief Complaint: Patient was seen in consultation today for ESRD at the request of Bhutani,Manpreet S  Referring Physician(s): Bhutani,Manpreet S  Supervising Physician: Gilmer Mor  Patient Status: [facility] - Out-pt  History of Present Illness: Jacob Farmer is a 77 y.o. male with PMHs of CAD, prostate CA, ESRD who presents for [facility] placement to initiate HD.   Patient has been followed by nephrology for about 14 years, he was recently diagnosed with ESRD and tunneled HD cath placement was recommended to the patient which he decided to proceed. Most recent RF in EMR on 05/01/23 Creatinine 5.73, RUB 51, GFR 9, potassium 4.0.   Patient laying in bed, not in acute distress.  States that he is still urinating, denies UTI symptoms.  Denise headache, fever, chills, shortness of breath, cough, chest pain, abdominal pain, nausea ,vomiting, and bleeding.    Past Medical History:  Diagnosis Date   CAD (coronary artery disease)    Acute myocardial infarction treated with TPA in 1990; 1999-stents to circumflex and RCA; residual total occlusion of the left anterior descending; normal ejection fraction; stress nuclear in 2001-distal anteroseptal ischemia; normal LV function   CKD (chronic kidney disease) stage 4, GFR 15-29 ml/min (HCC)    First degree heart block    History of gastric ulcer    History of kidney stones    History of MI (myocardial infarction)    1990-  treated w/ TPA   Hyperlipidemia    Hypertension    Jaundice    OA (osteoarthritis)    Prediabetes    Prostate cancer (HCC)    Stage T1c , Gleason 3+4,  PSA 4.7,  vol 24cc--  scheduled for radiative seed implants   Psoriatic arthritis (HCC)    RA (rheumatoid arthritis) (HCC)    Dr. Beatrix Fetters   Sinus bradycardia    Wears dentures     Past Surgical History:  Procedure Laterality Date   BIOPSY  08/27/2019   Procedure: BIOPSY;  Surgeon: Malissa Hippo, MD;  [facility];  Service: Endoscopy;;  gastric    BIOPSY  04/03/2022   Procedure: BIOPSY;  Surgeon: Dolores Frame, MD;  [facility];  Service: Gastroenterology;;   BIOPSY  04/21/2023   Procedure: BIOPSY;  Surgeon: Lanelle Bal, DO;  [facility];  Service: Endoscopy;;   callus removal Left 09/13/2022   left foot   CARDIOVASCULAR STRESS TEST  2001  per Dr Dietrich Pates clinic note   distal anteroseptal ischemia,  normal LVF   COLONOSCOPY  last one 2006 (approx)   COLONOSCOPY WITH PROPOFOL N/A 04/03/2022   Procedure: COLONOSCOPY WITH PROPOFOL;  Surgeon: Dolores Frame, MD;  [facility];  Service: Gastroenterology;  Laterality: N/A;  945   CORONARY ANGIOPLASTY WITH STENT PLACEMENT  1999   DES x2  to CFX and RCA/  residual total occlusion LAD,  normal LVEF   ENTEROSCOPY N/A 04/24/2023   Procedure: ENTEROSCOPY;  Surgeon: Dolores Frame, MD;  [facility];  Service: Gastroenterology;  Laterality: N/A;   ESOPHAGOGASTRODUODENOSCOPY (EGD) WITH PROPOFOL N/A 08/27/2019   Procedure: ESOPHAGOGASTRODUODENOSCOPY (EGD) WITH PROPOFOL;  Surgeon: Malissa Hippo, MD;  [facility];  Service: Endoscopy;  Laterality: N/A;  10:10   ESOPHAGOGASTRODUODENOSCOPY (EGD) WITH PROPOFOL N/A 04/03/2022   Procedure: ESOPHAGOGASTRODUODENOSCOPY (EGD) WITH PROPOFOL;  Surgeon: Dolores Frame, MD;  [facility];  Service: Gastroenterology;  Laterality: N/A;   ESOPHAGOGASTRODUODENOSCOPY (EGD) WITH PROPOFOL N/A 02/16/2023  Procedure: ESOPHAGOGASTRODUODENOSCOPY (EGD) WITH PROPOFOL;  Surgeon: Dolores Frame, MD;  [facility];  Service: Gastroenterology;  Laterality: N/A;   ESOPHAGOGASTRODUODENOSCOPY (EGD) WITH PROPOFOL N/A 04/21/2023   Procedure: ESOPHAGOGASTRODUODENOSCOPY (EGD) WITH PROPOFOL;  Surgeon: Lanelle Bal, DO;  [facility];  Service: Endoscopy;  Laterality: N/A;   EYE SURGERY Bilateral    cataract   GIVENS CAPSULE STUDY N/A  04/22/2023   Procedure: GIVENS CAPSULE STUDY;  Surgeon: Dolores Frame, MD;  [facility];  Service: Gastroenterology;  Laterality: N/A;   HEMOSTASIS CLIP PLACEMENT  04/24/2023   Procedure: HEMOSTASIS CLIP PLACEMENT;  Surgeon: Dolores Frame, MD;  [facility];  Service: Gastroenterology;;   HOT HEMOSTASIS  04/03/2022   Procedure: HOT HEMOSTASIS (ARGON PLASMA COAGULATION/BICAP);  Surgeon: Marguerita Merles, Reuel Boom, MD;  [facility];  Service: Gastroenterology;;   HOT HEMOSTASIS  04/24/2023   Procedure: HOT HEMOSTASIS (ARGON PLASMA COAGULATION/BICAP);  Surgeon: Marguerita Merles, Reuel Boom, MD;  [facility];  Service: Gastroenterology;;   POLYPECTOMY  04/03/2022   Procedure: POLYPECTOMY;  Surgeon: Dolores Frame, MD;  [facility];  Service: Gastroenterology;;   POSTERIOR CERVICAL FUSION/FORAMINOTOMY N/A 05/26/2017   Procedure: RIGHT C4-5 FORAMINOTOMY WITH EXCISION OF HERNIATED DISC;  Surgeon: Kerrin Champagne, MD;  Location: [facility] OR;  Service: Orthopedics;  Laterality: N/A;   RADIOACTIVE SEED IMPLANT N/A 01/11/2016   Procedure: RADIOACTIVE SEED IMPLANT/BRACHYTHERAPY IMPLANT;  Surgeon: Marcine Matar, MD;  Location: [facility];  Service: Urology;  Laterality: N/A;   73  seeds implanted no seeds founds in bladder   SCLEROTHERAPY  04/24/2023   Procedure: SCLEROTHERAPY;  Surgeon: Marguerita Merles, Reuel Boom, MD;  [facility];  Service: Gastroenterology;;   SUBMUCOSAL TATTOO INJECTION  04/24/2023   Procedure: SUBMUCOSAL TATTOO INJECTION;  Surgeon: Dolores Frame, MD;  [facility];  Service: Gastroenterology;;    Allergies: Patient has no known allergies.  Medications: Prior to Admission medications   Medication Sig Start Date End Date Taking? Authorizing Provider  allopurinol (ZYLOPRIM) 100 MG tablet TAKE 1/2 TABLET EVERY OTHER DAY 05/09/23  Yes Rice, Jamesetta Orleans, MD   amLODipine (NORVASC) 10 MG tablet Take 1 tablet (10 mg total) by mouth daily. 12/25/22  Yes Anabel Halon, MD  calcitRIOL (ROCALTROL) 0.25 MCG capsule Take 0.25 mcg by mouth. M, W, F   Yes [provider]  Dupilumab (DUPIXENT) 300 MG/2ML SOPN Inject 300 mg into the skin every 14 (fourteen) days. Starting at day 15 for maintenance. 05/07/23  Yes Deirdre Evener, MD  folic acid (FOLVITE) 400 MCG tablet Take 400 mcg by mouth daily.   Yes [provider]  furosemide (LASIX) 20 MG tablet Take 20 mg by mouth 2 (two) times daily.   Yes [provider]  hydrALAZINE (APRESOLINE) 50 MG tablet Take 100 mg by mouth 2 (two) times daily. 10/14/22 10/14/23 Yes [provider]  leflunomide (ARAVA) 10 MG tablet Take 1 tablet (10 mg total) by mouth daily. 05/13/23  Yes Deveshwar, Janalyn Rouse, MD  pantoprazole (PROTONIX) 40 MG tablet Take 1 tablet (40 mg total) by mouth 2 (two) times daily. 03/31/23  Yes Carlan, Chelsea L, NP  potassium chloride SA (KLOR-CON M) 20 MEQ tablet Take 20 mEq by mouth daily. 03/21/23 03/20/24 Yes [provider]  rosuvastatin (CRESTOR) 40 MG tablet TAKE 1 TABLET EVERY DAY. STOP ATORVASTATIN Patient taking differently: Take 40 mg by mouth daily. 01/21/23  Yes Branch, Dorothe Pea, MD  sucralfate (CARAFATE) 1 GM/10ML suspension Take 10 mLs (1 g total) by mouth 4 (four) times daily -  with meals and at bedtime. 04/25/23  Yes Tyrone Nine, MD  Colchicine 0.6 MG CAPS Take 0.6 mg by mouth daily as needed (Gout).    [provider]  nitroGLYCERIN (NITROSTAT) 0.4 MG SL tablet Place 0.4 mg under the tongue every 5 (five) minutes as needed for chest pain.  08/03/19   [provider]  triamcinolone cream (KENALOG) 0.1 % Apply 1 Application topically daily. 02/17/23   Cleora Fleet, MD     Family History  Problem Relation Age of Onset   Heart attack Mother    Coronary artery disease Father    Ulcerative colitis Father    Ulcers Father     Breast cancer Sister    COPD Brother    Pancreatic cancer Brother    Healthy Sister    Diabetes Brother    Cirrhosis Son    Seizures Son     Social History   Socioeconomic History   Marital status: Married    Spouse name: Not on file   Number of children: 2   Years of education: Not on file   Highest education level: Not on file  Occupational History   Occupation: Retired Personnel officer  Tobacco Use   Smoking status: Former    Packs/day: 1.00    Years: 30.00    Additional pack years: 0.00    Total pack years: 30.00    Types: Cigarettes    Quit date: 05/21/1989    Years since quitting: 34.0    Passive exposure: Never   Smokeless tobacco: Never  Vaping Use   Vaping Use: Never used  Substance and Sexual Activity   Alcohol use: Not Currently    Comment: 1 beer and 1 mix drink per day   Drug use: No   Sexual activity: Not on file  Other Topics Concern   Not on file  Social History Narrative   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   Social Determinants of Health   Financial Resource Strain: Low Risk  (02/18/2023)   Overall Financial Resource Strain (CARDIA)    Difficulty of Paying Living Expenses: Not very hard  Food Insecurity: No Food Insecurity (04/28/2023)   Hunger Vital Sign    Worried About Running Out of Food in the Last Year: Never true    Ran Out of Food in the Last Year: Never true  Transportation Needs: No Transportation Needs (04/28/2023)   PRAPARE - Administrator, Civil Service (Medical): No    Lack of Transportation (Non-Medical): No  Physical Activity: Sufficiently Active (09/14/2021)   Exercise Vital Sign    Days of Exercise per Week: 7 days    Minutes of Exercise per Session: 60 min  Stress: No Stress Concern Present (09/14/2021)   [employer] of Occupational Health - Occupational Stress Questionnaire    Feeling of Stress : Not at all  Social Connections: Moderately Integrated (09/14/2021)   Social Connection and Isolation Panel  [NHANES]    Frequency of Communication with Friends and Family: More than three times a week    Frequency of Social Gatherings with Friends and Family: More than three times a week    Attends Religious Services: More than 4 times per year    Active Member of [employer] or Organizations: No    Attends Banker Meetings: Never    Marital Status: Married     Review of  Systems: A 12 point ROS discussed and pertinent positives are indicated in the HPI above.  All other systems are negative.  Vital Signs: BP (!) 174/88   Pulse 83   Temp 98.2 F (36.8 C) (Oral)   Resp 18   Ht 5\' 10"  (1.778 m)   Wt 166 lb (75.3 kg)   SpO2 97%   BMI 23.82 kg/m    Physical Exam Vitals and nursing note reviewed.  Constitutional:      General: Patient is not in acute distress.    Appearance: Normal appearance. Patient is not ill-appearing.  HENT:     Head: Normocephalic and atraumatic.     Mouth/Throat:     Mouth: Mucous membranes are moist.     Pharynx: Oropharynx is clear.  Cardiovascular:     Rate and Rhythm: Normal rate and regular rhythm.     Pulses: Normal pulses.     Heart sounds: Normal heart sounds.  Pulmonary:     Effort: Pulmonary effort is normal.     Breath sounds: Normal breath sounds.  Abdominal:     General: Abdomen is flat. Bowel sounds are normal.     Palpations: Abdomen is soft.  Musculoskeletal:     Cervical back: Neck supple.  Skin:    General: Skin is warm and dry.     Coloration: Skin is not jaundiced or pale.  Neurological:     Mental Status: Patient is alert and oriented to person, place, and time.  Psychiatric:        Mood and Affect: Mood normal.        Behavior: Behavior normal.        Judgment: Judgment normal.    MD Evaluation Airway: WNL Heart: WNL Abdomen: WNL Chest/ Lungs: WNL ASA  Classification: 3 Mallampati/Airway Score: One  Imaging: No results found.  Labs:  CBC: Recent Labs    04/23/23 0508 04/24/23 0808 04/25/23 0453  05/01/23 1024  WBC 9.1 9.3 8.8 7.3  HGB 9.0* 9.0* 9.4* 10.0*  HCT 27.9* 28.3* 29.5* 31.7*  PLT 159 160 159 201    COAGS: Recent Labs    04/20/23 0322  INR 1.4*    BMP: Recent Labs    04/22/23 0512 04/23/23 0508 04/24/23 0808 04/25/23 0453 05/01/23 1024  NA 137 137 137 137 139  K 3.6 3.9 3.0* 2.6* 4.0  CL 110 110 109 108 102  CO2 18* 17* 17* 17* 17*  GLUCOSE 106* 108* 103* 116* 122*  BUN 64* 61* 58* 57* 51*  CALCIUM 8.1* 8.1* 7.9* 7.9* 8.4*  CREATININE 4.47* 4.50* 4.84* 4.90* 5.73*  GFRNONAA 13* 13* 12* 11*  --     LIVER FUNCTION TESTS: Recent Labs    08/28/22 1410 01/01/23 1346 02/14/23 0430 02/16/23 0503 03/28/23 1340 04/19/23 1841 04/24/23 0808 04/25/23 0453  BILITOT 0.3 0.4  --   --  0.4 0.5  --   --   AST 34 23  --   --  24 28  --   --   ALT 38 19  --   --  14 34  --   --   ALKPHOS  --   --   --   --   --  95  --   --   PROT 6.1 6.8  --   --  7.0 6.2*  --   --   ALBUMIN  --   --    < > 3.2*  --  2.9* 2.4* 2.4*   < > =  values in this interval not displayed.    TUMOR MARKERS: No results for input(s): "AFPTM", "CEA", "CA199", "CHROMGRNA" in the last 8760 hours.  Assessment and Plan: 77 y.o. male with CKD which recently progressed to ESRD, he is in need of long ter, HD access.   NPO since 7:30 today VS hypertensive 174/88, pt states that he took his HTN meds this morning, his normal SBP runs in 140s but it does go up when he goes to a hospital. He denies HA/CP/vision changes.  Not on AC/AP NKDA 2 g ancef to be given in IR  Risks and benefits discussed with the patient including, but not limited to bleeding, infection, vascular injury, pneumothorax which may require chest tube placement, air embolism or even death  All of the patient's questions were answered, patient is agreeable to proceed. Consent signed and in chart.    Thank you for this interesting consult.  I greatly enjoyed meeting Jacob Farmer and look forward to participating in  their care.  A copy of this report was sent to the requesting provider on this date.  Electronically Signed: Willette Brace, PA-C 05/30/2023, 1:49 PM   I spent a total of    25 min in face to face in clinical consultation, greater than 50% of which was counseling/coordinating care for [facility] placement.   This chart was dictated using voice recognition software.  Despite best efforts to proofread,  errors can occur which can change the documentation meaning.

## 2023-06-02 ENCOUNTER — Ambulatory Visit (INDEPENDENT_AMBULATORY_CARE_PROVIDER_SITE_OTHER): Payer: Medicare HMO | Admitting: Gastroenterology

## 2023-06-02 ENCOUNTER — Encounter (INDEPENDENT_AMBULATORY_CARE_PROVIDER_SITE_OTHER): Payer: Self-pay | Admitting: Gastroenterology

## 2023-06-02 VITALS — BP 150/75 | Temp 98.1°F | Ht 70.0 in | Wt 164.4 lb

## 2023-06-02 DIAGNOSIS — K289 Gastrojejunal ulcer, unspecified as acute or chronic, without hemorrhage or perforation: Secondary | ICD-10-CM | POA: Insufficient documentation

## 2023-06-02 DIAGNOSIS — Z992 Dependence on renal dialysis: Secondary | ICD-10-CM | POA: Diagnosis not present

## 2023-06-02 DIAGNOSIS — K5520 Angiodysplasia of colon without hemorrhage: Secondary | ICD-10-CM | POA: Diagnosis not present

## 2023-06-02 DIAGNOSIS — R935 Abnormal findings on diagnostic imaging of other abdominal regions, including retroperitoneum: Secondary | ICD-10-CM

## 2023-06-02 DIAGNOSIS — N2581 Secondary hyperparathyroidism of renal origin: Secondary | ICD-10-CM | POA: Diagnosis not present

## 2023-06-02 DIAGNOSIS — N170 Acute kidney failure with tubular necrosis: Secondary | ICD-10-CM | POA: Diagnosis not present

## 2023-06-02 NOTE — Progress Notes (Deleted)
Office Visit Note  Patient: Jacob Farmer             Date of Birth: 1946/06/24           MRN: [id number]             PCP: Anabel Halon, MD Referring: Anabel Halon, MD Visit Date: 06/10/2023 Occupation: @GUAROCC @  Subjective:  No chief complaint on file.   History of Present Illness: Jacob Farmer is a 77 y.o. male ***     Activities of Daily Living:  Patient reports morning stiffness for *** {minute/hour:19697}.   Patient {ACTIONS;DENIES/REPORTS:21021675::"Denies"} nocturnal pain.  Difficulty dressing/grooming: {ACTIONS;DENIES/REPORTS:21021675::"Denies"} Difficulty climbing stairs: {ACTIONS;DENIES/REPORTS:21021675::"Denies"} Difficulty getting out of chair: {ACTIONS;DENIES/REPORTS:21021675::"Denies"} Difficulty using hands for taps, buttons, cutlery, and/or writing: {ACTIONS;DENIES/REPORTS:21021675::"Denies"}  No Rheumatology ROS completed.   PMFS History:  Patient Active Problem List   Diagnosis Date Noted   Melena 04/23/2023   Acute blood loss anemia 04/19/2023   Anemia due to stage 4 chronic kidney disease (HCC) 03/25/2023   Iron deficiency anemia 03/25/2023   Acute GI bleeding 02/26/2023   Hospital discharge follow-up 02/26/2023   ABLA (acute blood loss anemia) 02/26/2023   Ulcer of esophagus without bleeding 02/16/2023   Upper GI bleed 02/13/2023   Acquired thrombophilia (HCC) 02/13/2023   Generalized weakness 02/13/2023   Acute bronchitis 02/13/2023   Drug-induced liver injury 02/08/2023   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   Gastritis and gastroduodenitis 02/06/2023   Bleeding from right ear 12/25/2022   Chronic heart failure with preserved ejection fraction (HCC) 07/22/2022   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   Dizziness 04/09/2022   Encounter for general adult medical examination with abnormal findings 01/25/2022   Paroxysmal atrial fibrillation (HCC) 01/22/2022   Allergic dermatitis 09/12/2021   Herniated  cervical disc 05/26/2017    Class: Acute   Cervical spinal stenosis 05/26/2017   Primary osteoarthritis of both hands 02/04/2017   History of gout 02/04/2017   DDD (degenerative disc disease), lumbar 02/04/2017   High risk medication use 01/28/2017   History of prostate cancer 01/28/2017   Chronic kidney disease, stage 4 (severe) (HCC) 12/27/2011   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   Rheumatoid arthritis (HCC) 01/04/2011   SINUS BRADYCARDIA 01/12/2010   Coronary artery disease involving native coronary artery of native heart without angina pectoris 01/12/2010   Hyperlipidemia 01/08/2010   Essential hypertension 01/08/2010    Past Medical History:  Diagnosis Date   CAD (coronary artery disease)    Acute myocardial infarction treated with TPA in 1990; 1999-stents to circumflex and RCA; residual total occlusion of the left anterior descending; normal ejection fraction; stress nuclear in 2001-distal anteroseptal ischemia; normal LV function   CKD (chronic kidney disease) stage 4, GFR 15-29 ml/min (HCC)    First degree heart block    History of gastric ulcer    History of kidney stones    History of MI (myocardial infarction)    1990-  treated w/ TPA   Hyperlipidemia    Hypertension    Jaundice    OA (osteoarthritis)    Prediabetes    Prostate cancer (HCC)    Stage T1c , Gleason 3+4,  PSA 4.7,  vol 24cc--  scheduled for radiative seed implants   Psoriatic arthritis (HCC)    RA (rheumatoid arthritis) (HCC)    Dr. Beatrix Fetters   Sinus bradycardia    Wears dentures     Family History  Problem Relation Age of Onset  Heart attack Mother    Coronary artery disease Father    Ulcerative colitis Father    Ulcers Father    Breast cancer Sister    COPD Brother    Pancreatic cancer Brother    Healthy Sister    Diabetes Brother    Cirrhosis Son    Seizures Son    Past Surgical History:  Procedure Laterality Date   BIOPSY  08/27/2019    Procedure: BIOPSY;  Surgeon: Malissa Hippo, MD;  [facility];  Service: Endoscopy;;  gastric   BIOPSY  04/03/2022   Procedure: BIOPSY;  Surgeon: Dolores Frame, MD;  [facility];  Service: Gastroenterology;;   BIOPSY  04/21/2023   Procedure: BIOPSY;  Surgeon: Lanelle Bal, DO;  [facility]: [facility];  Service: Endoscopy;;   callus removal Left 09/13/2022   left foot   CARDIOVASCULAR STRESS TEST  2001  per Dr Dietrich Pates clinic note   distal anteroseptal ischemia,  normal LVF   COLONOSCOPY  last one 2006 (approx)   COLONOSCOPY WITH PROPOFOL N/A 04/03/2022   Procedure: COLONOSCOPY WITH PROPOFOL;  Surgeon: Dolores Frame, MD;  [facility]: [facility];  Service: Gastroenterology;  Laterality: N/A;  945   CORONARY ANGIOPLASTY WITH STENT PLACEMENT  1999   DES x2  to CFX and RCA/  residual total occlusion LAD,  normal LVEF   ENTEROSCOPY N/A 04/24/2023   Procedure: ENTEROSCOPY;  Surgeon: Dolores Frame, MD;  [facility];  Service: Gastroenterology;  Laterality: N/A;   ESOPHAGOGASTRODUODENOSCOPY (EGD) WITH PROPOFOL N/A 08/27/2019   Procedure: ESOPHAGOGASTRODUODENOSCOPY (EGD) WITH PROPOFOL;  Surgeon: Malissa Hippo, MD;  [facility];  Service: Endoscopy;  Laterality: N/A;  10:10   ESOPHAGOGASTRODUODENOSCOPY (EGD) WITH PROPOFOL N/A 04/03/2022   Procedure: ESOPHAGOGASTRODUODENOSCOPY (EGD) WITH PROPOFOL;  Surgeon: Dolores Frame, MD;  [facility];  Service: Gastroenterology;  Laterality: N/A;   ESOPHAGOGASTRODUODENOSCOPY (EGD) WITH PROPOFOL N/A 02/16/2023   Procedure: ESOPHAGOGASTRODUODENOSCOPY (EGD) WITH PROPOFOL;  Surgeon: Dolores Frame, MD;  [facility];  Service: Gastroenterology;  Laterality: N/A;   ESOPHAGOGASTRODUODENOSCOPY (EGD) WITH PROPOFOL N/A 04/21/2023   Procedure: ESOPHAGOGASTRODUODENOSCOPY (EGD) WITH PROPOFOL;  Surgeon: Lanelle Bal, DO;  [facility];  Service: Endoscopy;  Laterality: N/A;   EYE SURGERY Bilateral    cataract   GIVENS CAPSULE STUDY N/A 04/22/2023   Procedure: GIVENS CAPSULE STUDY;  Surgeon: Dolores Frame, MD;  [facility];  Service: Gastroenterology;  Laterality: N/A;   HEMOSTASIS CLIP PLACEMENT  04/24/2023   Procedure: HEMOSTASIS CLIP PLACEMENT;  Surgeon: Dolores Frame, MD;  [facility];  Service: Gastroenterology;;   HOT HEMOSTASIS  04/03/2022   Procedure: HOT HEMOSTASIS (ARGON PLASMA COAGULATION/BICAP);  Surgeon: Marguerita Merles, Reuel Boom, MD;  [facility];  Service: Gastroenterology;;   HOT HEMOSTASIS  04/24/2023   Procedure: HOT HEMOSTASIS (ARGON PLASMA COAGULATION/BICAP);  Surgeon: Marguerita Merles, Reuel Boom, MD;  [facility];  Service: Gastroenterology;;   IR FLUORO GUIDE CV LINE RIGHT  05/30/2023   IR US GUIDE VASC ACCESS RIGHT  05/30/2023   POLYPECTOMY  04/03/2022   Procedure: POLYPECTOMY;  Surgeon: Dolores Frame, MD;  [facility];  Service: Gastroenterology;;   POSTERIOR CERVICAL FUSION/FORAMINOTOMY N/A 05/26/2017   Procedure: RIGHT C4-5 FORAMINOTOMY WITH EXCISION OF HERNIATED DISC;  Surgeon: Kerrin Champagne, MD;  Location[facility];  Service: Orthopedics;  Laterality: N/A;   RADIOACTIVE SEED IMPLANT N/A 01/11/2016  Procedure: RADIOACTIVE SEED IMPLANT/BRACHYTHERAPY IMPLANT;  Surgeon: Marcine Matar, MD;  Location: [facility];  Service: Urology;  Laterality: N/A;   73  seeds implanted no seeds founds in bladder   SCLEROTHERAPY  04/24/2023   Procedure: SCLEROTHERAPY;  Surgeon: Dolores Frame, MD;  [facility];  Service: Gastroenterology;;   SUBMUCOSAL TATTOO INJECTION  04/24/2023   Procedure: SUBMUCOSAL TATTOO INJECTION;  Surgeon: Dolores Frame, MD;  [facility];  Service: Gastroenterology;;   Social History   Social History Narrative   Married for 24  years.Retired Personnel officer.   Immunization History  Administered Date(s) Administered   Fluad Quad(high Dose 65+) 09/10/2022   Influenza, High Dose Seasonal PF 09/12/2017, 08/28/2019, 08/28/2019   Influenza-Unspecified 08/08/2020, 08/24/2021   PFIZER(Purple Top)SARS-COV-2 Vaccination 01/22/2020, 02/12/2020, 08/15/2020, 01/19/2021   PNEUMOCOCCAL CONJUGATE-20 01/28/2022   Pneumococcal Polysaccharide-23 08/16/2009   Td 10/26/2021   Tdap 09/28/2021   Zoster Recombinat (Shingrix) 07/29/2019, 09/28/2021     Objective: Vital Signs: There were no vitals taken for this visit.   Physical Exam   Musculoskeletal Exam: ***  CDAI Exam: CDAI Score: -- Patient Global: --; Provider Global: -- Swollen: --; Tender: -- Joint Exam 06/10/2023   No joint exam has been documented for this visit   There is currently no information documented on the homunculus. Go to the Rheumatology activity and complete the homunculus joint exam.  Investigation: No additional findings.  Imaging: IR Fluoro Guide CV Line Right  Result Date: 05/30/2023 INDICATION: 77 year old male referred for tunneled hemodialysis catheter EXAM: ULTRASOUND-GUIDED ACCESS RIGHT INTERNAL JUGULAR VEIN TUNNELED HEMODIALYSIS CATHETER MEDICATIONS: 2 g Ancef; The antibiotic was administered within an appropriate time interval prior to skin puncture. ANESTHESIA/SEDATION: Moderate (conscious) sedation was employed during this procedure. A total of Versed 1.0 mg and Fentanyl 50 mcg was administered intravenously by the radiology nurse. Total intra-service moderate Sedation Time: 16 minutes. The patient's level of consciousness and vital signs were monitored continuously by radiology nursing throughout the procedure under my direct supervision. FLUOROSCOPY: Radiation Exposure Index (as provided by the fluoroscopic device): 1 mGy Kerma COMPLICATIONS: None PROCEDURE: Informed written consent was obtained from the patient after a discussion of the  risks, benefits, and alternatives to treatment. Questions regarding the procedure were encouraged and answered. The right neck and chest were prepped with chlorhexidine in a sterile fashion, and a sterile drape was applied covering the operative field. Maximum barrier sterile technique with sterile gowns and gloves were used for the procedure. A timeout was performed prior to the initiation of the procedure. Ultrasound survey was performed. The right internal jugular vein was confirmed to be patent, with images stored and sent to PACS. Micropuncture kit was utilized to access the right internal jugular vein under direct, real-time ultrasound guidance after the overlying soft tissues were anesthetized with 1% lidocaine with epinephrine. Stab incision was made with 11 blade scalpel. Microwire was passed centrally. The microwire was then marked to measure appropriate internal catheter length. External tunneled length was estimated. A total tip to cuff length of 23 cm was selected. 035 guidewire was advanced to the level of the IVC. Skin and subcutaneous tissues of chest wall below the clavicle were generously infiltrated with 1% lidocaine for local anesthesia. A small stab incision was made with 11 blade scalpel. The selected hemodialysis catheter was tunneled in a retrograde fashion from the anterior chest wall to the venotomy incision. Serial dilation was performed and then a peel-away sheath was placed. The catheter was then placed  through the peel-away sheath with tips ultimately positioned within the superior aspect of the right atrium. Final catheter positioning was confirmed and documented with a spot radiographic image. The catheter aspirates and flushes normally. The catheter was flushed with appropriate volume heparin dwells. The catheter exit site was secured with a 0-Prolene retention suture. Gel-Foam slurry was infused into the soft tissue tract. The venotomy incision was closed Derma bond and sterile  dressing. Dressings were applied at the chest wall. Patient tolerated the procedure well and remained hemodynamically stable throughout. No complications were encountered and no significant blood loss encountered. IMPRESSION: Status post image guided right IJ tunneled hemodialysis catheter. Signed, Yvone Neu. Miachel Roux, RPVI [facility] Radiology Electronically Signed   By: Gilmer Mor D.O.   On: 05/30/2023 15:42   IR US Guide Vasc Access Right  Result Date: 05/30/2023 INDICATION: 77 year old male referred for tunneled hemodialysis catheter EXAM: ULTRASOUND-GUIDED ACCESS RIGHT INTERNAL JUGULAR VEIN TUNNELED HEMODIALYSIS CATHETER MEDICATIONS: 2 g Ancef; The antibiotic was administered within an appropriate time interval prior to skin puncture. ANESTHESIA/SEDATION: Moderate (conscious) sedation was employed during this procedure. A total of Versed 1.0 mg and Fentanyl 50 mcg was administered intravenously by the radiology nurse. Total intra-service moderate Sedation Time: 16 minutes. The patient's level of consciousness and vital signs were monitored continuously by radiology nursing throughout the procedure under my direct supervision. FLUOROSCOPY: Radiation Exposure Index (as provided by the fluoroscopic device): 1 mGy Kerma COMPLICATIONS: None PROCEDURE: Informed written consent was obtained from the patient after a discussion of the risks, benefits, and alternatives to treatment. Questions regarding the procedure were encouraged and answered. The right neck and chest were prepped with chlorhexidine in a sterile fashion, and a sterile drape was applied covering the operative field. Maximum barrier sterile technique with sterile gowns and gloves were used for the procedure. A timeout was performed prior to the initiation of the procedure. Ultrasound survey was performed. The right internal jugular vein was confirmed to be patent, with images stored and sent  to PACS. Micropuncture kit was utilized to access the right internal jugular vein under direct, real-time ultrasound guidance after the overlying soft tissues were anesthetized with 1% lidocaine with epinephrine. Stab incision was made with 11 blade scalpel. Microwire was passed centrally. The microwire was then marked to measure appropriate internal catheter length. External tunneled length was estimated. A total tip to cuff length of 23 cm was selected. 035 guidewire was advanced to the level of the IVC. Skin and subcutaneous tissues of chest wall below the clavicle were generously infiltrated with 1% lidocaine for local anesthesia. A small stab incision was made with 11 blade scalpel. The selected hemodialysis catheter was tunneled in a retrograde fashion from the anterior chest wall to the venotomy incision. Serial dilation was performed and then a peel-away sheath was placed. The catheter was then placed through the peel-away sheath with tips ultimately positioned within the superior aspect of the right atrium. Final catheter positioning was confirmed and documented with a spot radiographic image. The catheter aspirates and flushes normally. The catheter was flushed with appropriate volume heparin dwells. The catheter exit site was secured with a 0-Prolene retention suture. Gel-Foam slurry was infused into the soft tissue tract. The venotomy incision was closed Derma bond and sterile dressing. Dressings were applied at the chest wall. Patient tolerated the procedure well and remained hemodynamically stable throughout. No complications were encountered and no significant blood loss encountered. IMPRESSION: Status post image guided right  IJ tunneled hemodialysis catheter. Signed, Yvone Neu. Miachel Roux, RPVI [facility] Radiology Electronically Signed   By: Gilmer Mor D.O.   On: 05/30/2023 15:42    Recent Labs: Lab Results  Component Value Date   WBC 7.3  05/01/2023   HGB 10.0 (L) 05/01/2023   PLT 201 05/01/2023   NA 139 05/01/2023   K 4.0 05/01/2023   CL 102 05/01/2023   CO2 17 (L) 05/01/2023   GLUCOSE 122 (H) 05/01/2023   BUN 51 (H) 05/01/2023   CREATININE 5.73 (H) 05/01/2023   BILITOT 0.5 04/19/2023   ALKPHOS 95 04/19/2023   AST 28 04/19/2023   ALT 34 04/19/2023   PROT 6.2 (L) 04/19/2023   ALBUMIN 2.4 (L) 04/25/2023   CALCIUM 8.4 (L) 05/01/2023   GFRAA 27 (L) 06/14/2021    Speciality Comments: No specialty comments available.  Procedures:  No procedures performed Allergies: Patient has no known allergies.   Assessment / Plan:     Visit Diagnoses: No diagnosis found.  Orders: No orders of the defined types were placed in this encounter.  No orders of the defined types were placed in this encounter.   Face-to-face time spent with patient was *** minutes. Greater than 50% of time was spent in counseling and coordination of care.  Follow-Up Instructions: No follow-ups on file.   Ellen Henri, CMA  Note - This record has been created using Animal nutritionist.  Chart creation errors have been sought, but may not always  have been located. Such creation errors do not reflect on  the standard of medical care.

## 2023-06-02 NOTE — Patient Instructions (Addendum)
Will request blood workup from Dr. Wolfgang Phoenix Continue pantoprazole 40 mg twice a day

## 2023-06-02 NOTE — Progress Notes (Signed)
[name], M.D. Gastroenterology & Hepatology [facility] [address] [place], [place] [address]  Primary Care Physician: Anabel Halon, MD [address] [place] [address]  I will communicate my assessment and recommendations to the referring MD via EMR.  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  The patient was last seen on while hospitalized at [facility] on May 2024.  The patient was hospitalized after presenting recurrent melena with drop in his hemoglobin down to 4.9 for which he received 3 units of PRBC.  EGD was performed which showed normal esophagus with healed esophageal ulcer, presence of gastritis and erythematous duodenopathy.  Biopsies from the stomach showed reactive gastropathy negative for H. pylori.  He underwent a capsule endoscopy next day which showed a few small AVMs in the small bowel with wisps of bright red blood but no active bleeding.  Due to this, the patient underwent an enteroscopy on 04/24/2023 which showed 1 oozing ulcer in the jejunum with very slow oozing.  This was clipped x 2.,  There was another ulcer in the proximal jejunum measuring 10 mm in size, clipped for marking purposes.  A single AVM without bleeding was found in the proximal  jejunum which was ablated.Patient the patient remained stable since then and decision was made to hold Eliquis indefinitely given his risk of recurrent bleeding.  Also, given concern for mesenteric stenosis, the patient underwent ultrasound of the mesenteric arteries which showed possible obstruction of up to 70% in the mid superior mesenteric artery.  Patient reports feeling well. Denies any complaints. Started dialysis today. States his stools have been normal. He is taking  pantoprazole 40 mg twice a day. He did not restart Eliquis since his last hospitalization. The patient denies having any nausea, vomiting, fever, chills, hematochezia, melena, hematemesis, abdominal distention, abdominal pain, diarrhea, jaundice, pruritus or weight loss. Has had some shortnes of breath when he has been talking, but states today his SOB was better after HD.  His most recent labs on 05/21/23 showed a Hb  8.8 MCV 92 WBC 6.4, PLT 147 , CMP with BUN 49 and Cr 6.09.  He had labs done today at his dialysis center but the results are not back yet.  Notably, the patient was seen by Dr. Randie Heinz from vascular surgery on 05/21/2023.  It was considered that given his ongoing medical issues and absence of symptoms, he should follow-up with vascular surgery with a repeat duplex in 1 year.  Last EGD: As above Last Colonoscopy: 03/2022  Two colonic angiodysplastic lesions. Treated with argon plasma coagulation (APC).  - Three 3 to 6 mm polyps in the ascending colon and in the cecum, removed with a cold snare. Resected and retrieved. - The distal rectum and anal verge are normal on retroflexion view No repeat colonoscopy given patient's age  Past Medical History: Past Medical History:  Diagnosis Date  CAD (coronary artery disease)    Acute myocardial infarction treated with TPA in 1990; 1999-stents to circumflex and RCA; residual total occlusion of the left anterior descending; normal ejection fraction; stress nuclear in 2001-distal  anteroseptal ischemia; normal LV function   CKD (chronic kidney disease) stage 4, GFR 15-29 ml/min (HCC)    First degree heart block    History of gastric ulcer    History of kidney stones    History of MI (myocardial infarction)    1990-  treated w/ TPA   Hyperlipidemia    Hypertension    Jaundice    OA (osteoarthritis)    Prediabetes    Prostate cancer (HCC)    Stage T1c , Gleason 3+4,  PSA 4.7,  vol 24cc--  scheduled for radiative seed implants   Psoriatic arthritis (HCC)    RA (rheumatoid arthritis) (HCC)    Dr. Beatrix Fetters   Sinus bradycardia    Wears dentures     Past Surgical History: Past Surgical History:  Procedure Laterality Date   BIOPSY  08/27/2019   Procedure: BIOPSY;  Surgeon: Malissa Hippo, MD;  [facility];  Service: Endoscopy;;  gastric   BIOPSY  04/03/2022   Procedure: BIOPSY;  Surgeon: Dolores Frame, MD;  [facility];  Service: Gastroenterology;;   BIOPSY  04/21/2023   Procedure: BIOPSY;  Surgeon: Lanelle Bal, DO;  [facility];  Service: Endoscopy;;   callus removal Left 09/13/2022   left foot   CARDIOVASCULAR STRESS TEST  2001  per Dr Dietrich Pates clinic note   distal anteroseptal ischemia,  normal LVF   COLONOSCOPY  last one 2006 (approx)   COLONOSCOPY WITH PROPOFOL N/A 04/03/2022   Procedure: COLONOSCOPY WITH PROPOFOL;  Surgeon: Dolores Frame, MD;  [facility];  Service: Gastroenterology;  Laterality: N/A;  945   CORONARY ANGIOPLASTY WITH STENT PLACEMENT  1999   DES x2  to CFX and RCA/  residual total occlusion LAD,  normal LVEF   ENTEROSCOPY N/A 04/24/2023   Procedure: ENTEROSCOPY;  Surgeon: Dolores Frame, MD;  [facility];  Service: Gastroenterology;  Laterality: N/A;   ESOPHAGOGASTRODUODENOSCOPY (EGD) WITH PROPOFOL N/A 08/27/2019   Procedure: ESOPHAGOGASTRODUODENOSCOPY (EGD) WITH PROPOFOL;  Surgeon: Malissa Hippo, MD;  [facility];  Service:  Endoscopy;  Laterality: N/A;  10:10   ESOPHAGOGASTRODUODENOSCOPY (EGD) WITH PROPOFOL N/A 04/03/2022   Procedure: ESOPHAGOGASTRODUODENOSCOPY (EGD) WITH PROPOFOL;  Surgeon: Dolores Frame, MD;  [facility];  Service: Gastroenterology;  Laterality: N/A;   ESOPHAGOGASTRODUODENOSCOPY (EGD) WITH PROPOFOL N/A 02/16/2023   Procedure: ESOPHAGOGASTRODUODENOSCOPY (EGD) WITH PROPOFOL;  Surgeon: Dolores Frame, MD;  [facility];  Service: Gastroenterology;  Laterality: N/A;   ESOPHAGOGASTRODUODENOSCOPY (EGD) WITH PROPOFOL N/A 04/21/2023   Procedure: ESOPHAGOGASTRODUODENOSCOPY (EGD) WITH PROPOFOL;  Surgeon: Lanelle Bal, DO;  [facility];  Service: Endoscopy;  Laterality: N/A;   EYE SURGERY Bilateral    cataract   GIVENS CAPSULE STUDY N/A 04/22/2023   Procedure: GIVENS CAPSULE STUDY;  Surgeon: Dolores Frame, MD;  [facility];  Service: Gastroenterology;  Laterality: N/A;   HEMOSTASIS CLIP PLACEMENT  04/24/2023   Procedure: HEMOSTASIS CLIP PLACEMENT;  Surgeon: Dolores Frame, MD;  [facility];  Service: Gastroenterology;;   HOT HEMOSTASIS  04/03/2022   Procedure: HOT HEMOSTASIS (ARGON PLASMA COAGULATION/BICAP);  Surgeon: Marguerita Merles, Reuel Boom, MD;  [facility];  Service: Gastroenterology;;   HOT HEMOSTASIS  04/24/2023   Procedure: HOT HEMOSTASIS (ARGON PLASMA COAGULATION/BICAP);  Surgeon: Marguerita Merles, Reuel Boom, MD;  [facility];  Service: Gastroenterology;;   IR FLUORO GUIDE CV LINE RIGHT  05/30/2023   IR US GUIDE VASC ACCESS RIGHT  05/30/2023   POLYPECTOMY  04/03/2022   Procedure: POLYPECTOMY;  Surgeon: Dolores Frame, MD;  [facility];  Service: Gastroenterology;;   POSTERIOR CERVICAL FUSION/FORAMINOTOMY N/A 05/26/2017   Procedure: RIGHT C4-5 FORAMINOTOMY WITH EXCISION OF HERNIATED DISC;  Surgeon: Kerrin Champagne, MD;  Location: [facility];  Service: Orthopedics;   Laterality: N/A;   RADIOACTIVE SEED IMPLANT N/A 01/11/2016   Procedure: RADIOACTIVE SEED IMPLANT/BRACHYTHERAPY IMPLANT;  Surgeon: Marcine Matar, MD;  Location: [facility];  Service: Urology;  Laterality: N/A;   73  seeds implanted no seeds founds in bladder   SCLEROTHERAPY  04/24/2023   Procedure: SCLEROTHERAPY;  Surgeon: Marguerita Merles, Reuel Boom, MD;  [facility];  Service: Gastroenterology;;   SUBMUCOSAL TATTOO INJECTION  04/24/2023   Procedure: SUBMUCOSAL TATTOO INJECTION;  Surgeon: Marguerita Merles, Reuel Boom, MD;  [facility];  Service: Gastroenterology;;    Family History: Family History  Problem Relation Age of Onset   Heart attack Mother    Coronary artery disease Father    Ulcerative colitis Father    Ulcers Father    Breast cancer Sister    COPD Brother    Pancreatic cancer Brother    Healthy Sister    Diabetes Brother    Cirrhosis Son    Seizures Son     Social History: Social History   Tobacco Use  Smoking Status Former   Packs/day: 1.00   Years: 30.00   Additional pack years: 0.00   Total pack years: 30.00   Types: Cigarettes   Quit date: 05/21/1989   Years since quitting: 34.0   Passive exposure: Never  Smokeless Tobacco Never   Social History   Substance and Sexual Activity  Alcohol Use Not Currently   Comment: 1 beer and 1 mix drink per day   Social History   Substance and Sexual Activity  Drug Use No    Allergies: No Known Allergies  Medications: Current Outpatient Medications  Medication Sig Dispense Refill   allopurinol (ZYLOPRIM) 100 MG tablet TAKE 1/2 TABLET EVERY OTHER DAY 23 tablet 0   amLODipine (NORVASC) 10 MG tablet Take 1 tablet (10 mg total) by mouth daily. 90 tablet 3   calcitRIOL (ROCALTROL) 0.25 MCG capsule Take 0.25 mcg by mouth. M, W, F     Colchicine 0.6 MG CAPS Take 0.6 mg by mouth daily as needed (Gout).     Dupilumab (DUPIXENT) 300 MG/2ML SOPN Inject 300 mg into the skin every 14  (fourteen) days. Starting at day 15 for maintenance. 4 mL 2   folic acid (FOLVITE) 400 MCG tablet Take 400 mcg by mouth daily.     furosemide (LASIX) 20 MG tablet Take 20 mg by mouth 2 (two) times daily.     hydrALAZINE (APRESOLINE) 50 MG tablet Take 100 mg by mouth 2 (two) times daily.     leflunomide (ARAVA) 10 MG tablet Take 1 tablet (10 mg total) by mouth daily. 90 tablet 0   nitroGLYCERIN (NITROSTAT) 0.4 MG SL tablet Place 0.4 mg under the tongue every 5 (five) minutes as needed for chest pain.      pantoprazole (PROTONIX) 40 MG tablet Take 1 tablet (40 mg total) by mouth 2 (two) times daily. 180 tablet 1  potassium chloride SA (KLOR-CON M) 20 MEQ tablet Take 20 mEq by mouth daily.     rosuvastatin (CRESTOR) 40 MG tablet TAKE 1 TABLET EVERY DAY. STOP ATORVASTATIN (Patient taking differently: Take 40 mg by mouth daily.) 90 tablet 3   sucralfate (CARAFATE) 1 GM/10ML suspension Take 10 mLs (1 g total) by mouth 4 (four) times daily -  with meals and at bedtime. 420 mL 0   triamcinolone cream (KENALOG) 0.1 % Apply 1 Application topically daily.     No current facility-administered medications for this visit.    Review of Systems: GENERAL: negative for malaise, night sweats HEENT: No changes in hearing or vision, no nose bleeds or other nasal problems. NECK: Negative for lumps, goiter, pain and significant neck swelling RESPIRATORY: Negative for cough, wheezing CARDIOVASCULAR: Negative for chest pain, leg swelling, palpitations, orthopnea GI: SEE HPI MUSCULOSKELETAL: Negative for joint pain or swelling, back pain, and muscle pain. SKIN: Negative for lesions, rash PSYCH: Negative for sleep disturbance, mood disorder and recent psychosocial stressors. HEMATOLOGY Negative for prolonged bleeding, bruising easily, and swollen nodes. ENDOCRINE: Negative for cold or heat intolerance, polyuria, polydipsia and goiter. NEURO: negative for tremor, gait imbalance, syncope and seizures. The remainder  of the review of systems is noncontributory.   Physical Exam: BP (!) 150/75   Temp 98.1 F (36.7 C) (Oral)   Ht 5\' 10"  (1.778 m)   Wt 164 lb 6.4 oz (74.6 kg)   BMI 23.59 kg/m  GENERAL: The patient is AO x3, in no acute distress. HEENT: Head is normocephalic and atraumatic. EOMI are intact. Mouth is well hydrated and without lesions. NECK: Supple. No masses LUNGS: Clear to auscultation. No presence of rhonchi/wheezing/rales. Adequate chest expansion HEART: RRR, normal s1 and s2. ABDOMEN: Soft, nontender, no guarding, no peritoneal signs, and nondistended. BS +. No masses. EXTREMITIES: Without any cyanosis, clubbing, rash, lesions or edema. NEUROLOGIC: AOx3, no focal motor deficit. SKIN: no jaundice, no rashes  Imaging/Labs: as above  I personally reviewed and interpreted the available labs, imaging and endoscopic files.  Impression and Plan: TIMARI SUCHANEK is a 77 y.o. 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  The patient has been doing better clinically after stopping anticoagulation and has no presented any overt gastrointestinal bleeding.  He denies any complaints and it seems that most of the shortness of breath is related to third spacing.  However, his most recent labs showed a mild drop in his hemoglobin.  Will request his most recent labs drawn today and determine if further investigations are warranted.  Nevertheless, given the characteristics of the ulcers found on his most recent push enteroscopy, these are likely related to ischemia due to decreased splanchnic flow.  Due to this, we will continue with pantoprazole 40 mg every 12  hours.  -Will request blood workup from Dr. Wolfgang Phoenix in a week -Continue pantoprazole 40 mg twice a day  All questions were answered.      [name], MD Gastroenterology and Hepatology [facility]

## 2023-06-04 DIAGNOSIS — N2581 Secondary hyperparathyroidism of renal origin: Secondary | ICD-10-CM | POA: Diagnosis not present

## 2023-06-04 DIAGNOSIS — Z992 Dependence on renal dialysis: Secondary | ICD-10-CM | POA: Diagnosis not present

## 2023-06-04 DIAGNOSIS — N170 Acute kidney failure with tubular necrosis: Secondary | ICD-10-CM | POA: Diagnosis not present

## 2023-06-06 DIAGNOSIS — N2581 Secondary hyperparathyroidism of renal origin: Secondary | ICD-10-CM | POA: Diagnosis not present

## 2023-06-06 DIAGNOSIS — N170 Acute kidney failure with tubular necrosis: Secondary | ICD-10-CM | POA: Diagnosis not present

## 2023-06-06 DIAGNOSIS — Z992 Dependence on renal dialysis: Secondary | ICD-10-CM | POA: Diagnosis not present

## 2023-06-09 DIAGNOSIS — N2581 Secondary hyperparathyroidism of renal origin: Secondary | ICD-10-CM | POA: Diagnosis not present

## 2023-06-09 DIAGNOSIS — N170 Acute kidney failure with tubular necrosis: Secondary | ICD-10-CM | POA: Diagnosis not present

## 2023-06-09 DIAGNOSIS — Z992 Dependence on renal dialysis: Secondary | ICD-10-CM | POA: Diagnosis not present

## 2023-06-10 ENCOUNTER — Ambulatory Visit: Payer: Medicare HMO | Admitting: Rheumatology

## 2023-06-10 DIAGNOSIS — M1A09X0 Idiopathic chronic gout, multiple sites, without tophus (tophi): Secondary | ICD-10-CM

## 2023-06-10 DIAGNOSIS — Z13820 Encounter for screening for osteoporosis: Secondary | ICD-10-CM

## 2023-06-10 DIAGNOSIS — Z8639 Personal history of other endocrine, nutritional and metabolic disease: Secondary | ICD-10-CM

## 2023-06-10 DIAGNOSIS — Z8546 Personal history of malignant neoplasm of prostate: Secondary | ICD-10-CM

## 2023-06-10 DIAGNOSIS — M5136 Other intervertebral disc degeneration, lumbar region: Secondary | ICD-10-CM

## 2023-06-10 DIAGNOSIS — M25661 Stiffness of right knee, not elsewhere classified: Secondary | ICD-10-CM

## 2023-06-10 DIAGNOSIS — Z87448 Personal history of other diseases of urinary system: Secondary | ICD-10-CM

## 2023-06-10 DIAGNOSIS — M0600 Rheumatoid arthritis without rheumatoid factor, unspecified site: Secondary | ICD-10-CM

## 2023-06-10 DIAGNOSIS — Z8679 Personal history of other diseases of the circulatory system: Secondary | ICD-10-CM

## 2023-06-10 DIAGNOSIS — Z79899 Other long term (current) drug therapy: Secondary | ICD-10-CM

## 2023-06-10 DIAGNOSIS — M19041 Primary osteoarthritis, right hand: Secondary | ICD-10-CM

## 2023-06-10 DIAGNOSIS — M79672 Pain in left foot: Secondary | ICD-10-CM

## 2023-06-10 DIAGNOSIS — Z8719 Personal history of other diseases of the digestive system: Secondary | ICD-10-CM

## 2023-06-10 NOTE — Addendum Note (Signed)
Addended by: Ferdinand Lango on: 06/10/2023 10:19 AM   Modules accepted: Orders

## 2023-06-10 NOTE — Progress Notes (Unsigned)
Office Visit Note  Patient: Jacob Farmer             Date of Birth: 1946/07/09           MRN: [id number]             PCP: Anabel Halon, MD Referring: Anabel Halon, MD Visit Date: 06/17/2023 Occupation: @GUAROCC @  Subjective:  Medication monitoring  History of Present Illness: Jacob Farmer is a 77 y.o. male with history of seronegative rheumatoid arthritis and osteoarthritis.  Patient remains under the care of Dr. Wolfgang Phoenix for chronic kidney disease.  Patient was found to have end-stage renal disease and has been transition to dialysis on Monday, Wednesday, and Fridays.   He denies any rheumatoid arthritis flare since his last office visit.  Patient remains on Arava 10 mg 1 tablet by mouth daily.  He experiences some discomfort and stiffness in his lower back and both knees at times.  He has difficulty standing for prolonged periods of time.  He denies any joint swelling.  He has had injections in his lower back performed by Dr. Alvester Morin in the past which have been helpful at alleviating his symptoms.  He denies any signs or symptoms of a gout flare.  He takes allopurinol 50 mg every other day for management of gout.  He keeps a prescription for colchicine on hand for if he develops symptoms of a gout flare but has not needed to take colchicine recently.   Activities of Daily Living:  Patient reports morning stiffness for 0 minutes.   Patient Denies nocturnal pain.  Difficulty dressing/grooming: Denies Difficulty climbing stairs: Denies Difficulty getting out of chair: Denies Difficulty using hands for taps, buttons, cutlery, and/or writing: Denies  Review of Systems  Constitutional:  Positive for fatigue.  HENT:  Negative for mouth sores and mouth dryness.   Eyes:  Negative for dryness.  Respiratory:  Negative for shortness of breath.   Cardiovascular:  Negative for chest pain and palpitations.  Gastrointestinal:  Negative for blood in stool, constipation and diarrhea.   Endocrine: Negative for increased urination.  Genitourinary:  Negative for involuntary urination.  Musculoskeletal:  Positive for joint pain and joint pain. Negative for gait problem, joint swelling, myalgias, muscle weakness, morning stiffness, muscle tenderness and myalgias.  Skin:  Negative for color change, rash, hair loss and sensitivity to sunlight.  Allergic/Immunologic: Negative for susceptible to infections.  Neurological:  Negative for dizziness and headaches.  Hematological:  Negative for swollen glands.  Psychiatric/Behavioral:  Negative for depressed mood and sleep disturbance. The patient is not nervous/anxious.     PMFS History:  Patient Active Problem List   Diagnosis Date Noted   Jejunal ulcer 06/02/2023   Abnormal ultrasound of abdomen 06/02/2023   Melena 04/23/2023   Acute blood loss anemia 04/19/2023   Anemia due to stage 4 chronic kidney disease (HCC) 03/25/2023   Iron deficiency anemia 03/25/2023   Acute GI bleeding 02/26/2023   Hospital discharge follow-up 02/26/2023   ABLA (acute blood loss anemia) 02/26/2023   Ulcer of esophagus without bleeding 02/16/2023   Upper GI bleed 02/13/2023   Acquired thrombophilia (HCC) 02/13/2023   Generalized weakness 02/13/2023   Acute bronchitis 02/13/2023   Drug-induced liver injury 02/08/2023   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   Gastritis and gastroduodenitis 02/06/2023   Bleeding from right ear 12/25/2022   Chronic heart failure with preserved ejection fraction (HCC) 07/22/2022   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   Dizziness 04/09/2022  Encounter for general adult medical examination with abnormal findings 01/25/2022   Paroxysmal atrial fibrillation (HCC) 01/22/2022   Allergic dermatitis 09/12/2021   Herniated cervical disc 05/26/2017    Class: Acute   Cervical spinal stenosis 05/26/2017   Primary osteoarthritis of both hands 02/04/2017   History of gout 02/04/2017   DDD  (degenerative disc disease), lumbar 02/04/2017   High risk medication use 01/28/2017   History of prostate cancer 01/28/2017   Chronic kidney disease, stage 4 (severe) (HCC) 12/27/2011   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   Rheumatoid arthritis (HCC) 01/04/2011   SINUS BRADYCARDIA 01/12/2010   Coronary artery disease involving native coronary artery of native heart without angina pectoris 01/12/2010   Hyperlipidemia 01/08/2010   Essential hypertension 01/08/2010    Past Medical History:  Diagnosis Date   CAD (coronary artery disease)    Acute myocardial infarction treated with TPA in 1990; 1999-stents to circumflex and RCA; residual total occlusion of the left anterior descending; normal ejection fraction; stress nuclear in 2001-distal anteroseptal ischemia; normal LV function   CKD (chronic kidney disease) stage 4, GFR 15-29 ml/min (HCC)    First degree heart block    History of gastric ulcer    History of kidney stones    History of MI (myocardial infarction)    1990-  treated w/ TPA   Hyperlipidemia    Hypertension    Jaundice    OA (osteoarthritis)    Prediabetes    Prostate cancer (HCC)    Stage T1c , Gleason 3+4,  PSA 4.7,  vol 24cc--  scheduled for radiative seed implants   Psoriatic arthritis (HCC)    RA (rheumatoid arthritis) (HCC)    Dr. Beatrix Fetters   Sinus bradycardia    Wears dentures     Family History  Problem Relation Age of Onset   Heart attack Mother    Coronary artery disease Father    Ulcerative colitis Father    Ulcers Father    Breast cancer Sister    COPD Brother    Pancreatic cancer Brother    Healthy Sister    Diabetes Brother    Cirrhosis Son    Seizures Son    Past Surgical History:  Procedure Laterality Date   BIOPSY  08/27/2019   Procedure: BIOPSY;  Surgeon: Malissa Hippo, MD;  [facility];  Service: Endoscopy;;  gastric   BIOPSY  04/03/2022   Procedure: BIOPSY;  Surgeon:  Dolores Frame, MD;  [facility];  Service: Gastroenterology;;   BIOPSY  04/21/2023   Procedure: BIOPSY;  Surgeon: Lanelle Bal, DO;  [facility];  Service: Endoscopy;;   callus removal Left 09/13/2022   left foot   CARDIOVASCULAR STRESS TEST  2001  per Dr Dietrich Pates clinic note   distal anteroseptal ischemia,  normal LVF   COLONOSCOPY  last one 2006 (approx)   COLONOSCOPY WITH PROPOFOL N/A 04/03/2022   Procedure: COLONOSCOPY WITH PROPOFOL;  Surgeon: Dolores Frame, MD;  [facility]: [facility];  Service: Gastroenterology;  Laterality: N/A;  945   CORONARY ANGIOPLASTY WITH STENT PLACEMENT  1999   DES x2  to CFX and RCA/  residual total occlusion LAD,  normal LVEF   ENTEROSCOPY N/A 04/24/2023   Procedure: ENTEROSCOPY;  Surgeon: Dolores Frame, MD;  [facility];  Service: Gastroenterology;  Laterality: N/A;   ESOPHAGOGASTRODUODENOSCOPY (EGD) WITH PROPOFOL N/A 08/27/2019   Procedure: ESOPHAGOGASTRODUODENOSCOPY (EGD) WITH PROPOFOL;  Surgeon: Malissa Hippo, MD;  [facility];  Service: Endoscopy;  Laterality: N/A;  10:10   ESOPHAGOGASTRODUODENOSCOPY (EGD) WITH PROPOFOL N/A 04/03/2022   Procedure: ESOPHAGOGASTRODUODENOSCOPY (EGD) WITH PROPOFOL;  Surgeon: Dolores Frame, MD;  [facility];  Service: Gastroenterology;  Laterality: N/A;   ESOPHAGOGASTRODUODENOSCOPY (EGD) WITH PROPOFOL N/A 02/16/2023   Procedure: ESOPHAGOGASTRODUODENOSCOPY (EGD) WITH PROPOFOL;  Surgeon: Dolores Frame, MD;  [facility];  Service: Gastroenterology;  Laterality: N/A;   ESOPHAGOGASTRODUODENOSCOPY (EGD) WITH PROPOFOL N/A 04/21/2023   Procedure: ESOPHAGOGASTRODUODENOSCOPY (EGD) WITH PROPOFOL;  Surgeon: Lanelle Bal, DO;  [facility];  Service: Endoscopy;  Laterality: N/A;   EYE SURGERY Bilateral    cataract   GIVENS CAPSULE STUDY N/A 04/22/2023   Procedure: GIVENS CAPSULE STUDY;   Surgeon: Dolores Frame, MD;  [facility];  Service: Gastroenterology;  Laterality: N/A;   HEMOSTASIS CLIP PLACEMENT  04/24/2023   Procedure: HEMOSTASIS CLIP PLACEMENT;  Surgeon: Dolores Frame, MD;  [facility];  Service: Gastroenterology;;   HOT HEMOSTASIS  04/03/2022   Procedure: HOT HEMOSTASIS (ARGON PLASMA COAGULATION/BICAP);  Surgeon: Marguerita Merles, Reuel Boom, MD;  [facility];  Service: Gastroenterology;;   HOT HEMOSTASIS  04/24/2023   Procedure: HOT HEMOSTASIS (ARGON PLASMA COAGULATION/BICAP);  Surgeon: Marguerita Merles, Reuel Boom, MD;  [facility];  Service: Gastroenterology;;   IR FLUORO GUIDE CV LINE RIGHT  05/30/2023   IR US GUIDE VASC ACCESS RIGHT  05/30/2023   POLYPECTOMY  04/03/2022   Procedure: POLYPECTOMY;  Surgeon: Dolores Frame, MD;  [facility];  Service: Gastroenterology;;   POSTERIOR CERVICAL FUSION/FORAMINOTOMY N/A 05/26/2017   Procedure: RIGHT C4-5 FORAMINOTOMY WITH EXCISION OF HERNIATED DISC;  Surgeon: Kerrin Champagne, MD;  Location: [facility];  Service: Orthopedics;  Laterality: N/A;   RADIOACTIVE SEED IMPLANT N/A 01/11/2016   Procedure: RADIOACTIVE SEED IMPLANT/BRACHYTHERAPY IMPLANT;  Surgeon: Marcine Matar, MD;  Location: [facility];  Service: Urology;  Laterality: N/A;   73  seeds implanted no seeds founds in bladder   SCLEROTHERAPY  04/24/2023   Procedure: SCLEROTHERAPY;  Surgeon: Dolores Frame, MD;  [facility];  Service: Gastroenterology;;   SUBMUCOSAL TATTOO INJECTION  04/24/2023   Procedure: SUBMUCOSAL TATTOO INJECTION;  Surgeon: Dolores Frame, MD;  [facility];  Service: Gastroenterology;;   Social History   Social History Narrative   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   Immunization History  Administered Date(s) Administered   Fluad Quad(high Dose 65+) 09/10/2022   Influenza, High Dose Seasonal PF  09/12/2017, 08/28/2019, 08/28/2019   Influenza-Unspecified 08/08/2020, 08/24/2021   PFIZER(Purple Top)SARS-COV-2 Vaccination 01/22/2020, 02/12/2020, 08/15/2020, 01/19/2021   PNEUMOCOCCAL CONJUGATE-20 01/28/2022   Pneumococcal Polysaccharide-23 08/16/2009   Td 10/26/2021   Tdap 09/28/2021   Zoster Recombinant(Shingrix) 07/29/2019, 09/28/2021     Objective: Vital Signs: BP 135/76 (BP Location: Left Arm, Patient Position: Sitting, Cuff Size: Normal)   Pulse 73   Resp 15   Ht 5\' 10"  (1.778 m)   Wt 155 lb 9.6 oz (70.6 kg)   BMI 22.33 kg/m    Physical Exam Vitals and nursing note reviewed.  Constitutional:      Appearance: He is well-developed.  HENT:     Head: Normocephalic and atraumatic.  Eyes:     Conjunctiva/sclera: Conjunctivae normal.     Pupils: Pupils are equal, round, and reactive to light.  Cardiovascular:     Rate and Rhythm: Normal rate and regular  rhythm.     Heart sounds: Normal heart sounds.  Pulmonary:     Effort: Pulmonary effort is normal.     Breath sounds: Normal breath sounds.  Abdominal:     General: Bowel sounds are normal.     Palpations: Abdomen is soft.  Musculoskeletal:     Cervical back: Normal range of motion and neck supple.  Skin:    General: Skin is warm and dry.     Capillary Refill: Capillary refill takes less than 2 seconds.  Neurological:     Mental Status: He is alert and oriented to person, place, and time.  Psychiatric:        Behavior: Behavior normal.      Musculoskeletal Exam: C-spine has slightly limited mobility.  Shoulder joints, elbow joints, wrist joints, MCPs, PIPs, DIPs have good range of motion with no synovitis.  Some PIP and DIP thickening consistent with osteoarthritis of both hands.  Hip joints have good range of motion with no groin pain.  Knee joints have good range of motion no warmth or effusion.  Ankle joints have good range of motion with no tenderness or joint swelling.  CDAI Exam: CDAI Score: -- Patient  Global: 10 / 100; Provider Global: 10 / 100 Swollen: --; Tender: -- Joint Exam 06/17/2023   No joint exam has been documented for this visit   There is currently no information documented on the homunculus. Go to the Rheumatology activity and complete the homunculus joint exam.  Investigation: No additional findings.  Imaging: IR Fluoro Guide CV Line Right  Result Date: 05/30/2023 INDICATION: 77 year old male referred for tunneled hemodialysis catheter EXAM: ULTRASOUND-GUIDED ACCESS RIGHT INTERNAL JUGULAR VEIN TUNNELED HEMODIALYSIS CATHETER MEDICATIONS: 2 g Ancef; The antibiotic was administered within an appropriate time interval prior to skin puncture. ANESTHESIA/SEDATION: Moderate (conscious) sedation was employed during this procedure. A total of Versed 1.0 mg and Fentanyl 50 mcg was administered intravenously by the radiology nurse. Total intra-service moderate Sedation Time: 16 minutes. The patient's level of consciousness and vital signs were monitored continuously by radiology nursing throughout the procedure under my direct supervision. FLUOROSCOPY: Radiation Exposure Index (as provided by the fluoroscopic device): 1 mGy Kerma COMPLICATIONS: None PROCEDURE: Informed written consent was obtained from the patient after a discussion of the risks, benefits, and alternatives to treatment. Questions regarding the procedure were encouraged and answered. The right neck and chest were prepped with chlorhexidine in a sterile fashion, and a sterile drape was applied covering the operative field. Maximum barrier sterile technique with sterile gowns and gloves were used for the procedure. A timeout was performed prior to the initiation of the procedure. Ultrasound survey was performed. The right internal jugular vein was confirmed to be patent, with images stored and sent to PACS. Micropuncture kit was utilized to access the right internal jugular vein under direct, real-time ultrasound guidance after the  overlying soft tissues were anesthetized with 1% lidocaine with epinephrine. Stab incision was made with 11 blade scalpel. Microwire was passed centrally. The microwire was then marked to measure appropriate internal catheter length. External tunneled length was estimated. A total tip to cuff length of 23 cm was selected. 035 guidewire was advanced to the level of the IVC. Skin and subcutaneous tissues of chest wall below the clavicle were generously infiltrated with 1% lidocaine for local anesthesia. A small stab incision was made with 11 blade scalpel. The selected hemodialysis catheter was tunneled in a retrograde fashion from the anterior chest wall to the venotomy incision. Serial  dilation was performed and then a peel-away sheath was placed. The catheter was then placed through the peel-away sheath with tips ultimately positioned within the superior aspect of the right atrium. Final catheter positioning was confirmed and documented with a spot radiographic image. The catheter aspirates and flushes normally. The catheter was flushed with appropriate volume heparin dwells. The catheter exit site was secured with a 0-Prolene retention suture. Gel-Foam slurry was infused into the soft tissue tract. The venotomy incision was closed Derma bond and sterile dressing. Dressings were applied at the chest wall. Patient tolerated the procedure well and remained hemodynamically stable throughout. No complications were encountered and no significant blood loss encountered. IMPRESSION: Status post image guided right IJ tunneled hemodialysis catheter. Signed, Yvone Neu. Miachel Roux, RPVI [facility] Electronically Signed   By: Gilmer Mor D.O.   On: 05/30/2023 15:42   IR US Guide Vasc Access Right  Result Date: 05/30/2023 INDICATION: 77 year old male referred for tunneled hemodialysis catheter EXAM: ULTRASOUND-GUIDED ACCESS RIGHT INTERNAL JUGULAR VEIN  TUNNELED HEMODIALYSIS CATHETER MEDICATIONS: 2 g Ancef; The antibiotic was administered within an appropriate time interval prior to skin puncture. ANESTHESIA/SEDATION: Moderate (conscious) sedation was employed during this procedure. A total of Versed 1.0 mg and Fentanyl 50 mcg was administered intravenously by the radiology nurse. Total intra-service moderate Sedation Time: 16 minutes. The patient's level of consciousness and vital signs were monitored continuously by radiology nursing throughout the procedure under my direct supervision. FLUOROSCOPY: Radiation Exposure Index (as provided by the fluoroscopic device): 1 mGy Kerma COMPLICATIONS: None PROCEDURE: Informed written consent was obtained from the patient after a discussion of the risks, benefits, and alternatives to treatment. Questions regarding the procedure were encouraged and answered. The right neck and chest were prepped with chlorhexidine in a sterile fashion, and a sterile drape was applied covering the operative field. Maximum barrier sterile technique with sterile gowns and gloves were used for the procedure. A timeout was performed prior to the initiation of the procedure. Ultrasound survey was performed. The right internal jugular vein was confirmed to be patent, with images stored and sent to PACS. Micropuncture kit was utilized to access the right internal jugular vein under direct, real-time ultrasound guidance after the overlying soft tissues were anesthetized with 1% lidocaine with epinephrine. Stab incision was made with 11 blade scalpel. Microwire was passed centrally. The microwire was then marked to measure appropriate internal catheter length. External tunneled length was estimated. A total tip to cuff length of 23 cm was selected. 035 guidewire was advanced to the level of the IVC. Skin and subcutaneous tissues of chest wall below the clavicle were generously infiltrated with 1% lidocaine for local anesthesia. A small stab incision  was made with 11 blade scalpel. The selected hemodialysis catheter was tunneled in a retrograde fashion from the anterior chest wall to the venotomy incision. Serial dilation was performed and then a peel-away sheath was placed. The catheter was then placed through the peel-away sheath with tips ultimately positioned within the superior aspect of the right atrium. Final catheter positioning was confirmed and documented with a spot radiographic image. The catheter aspirates and flushes normally. The catheter was flushed with appropriate volume heparin dwells. The catheter exit site was secured with a 0-Prolene retention suture. Gel-Foam slurry was infused into the soft tissue tract. The venotomy incision was closed Derma bond and sterile dressing. Dressings were applied at the chest wall. Patient tolerated the procedure well and remained hemodynamically stable throughout. No  complications were encountered and no significant blood loss encountered. IMPRESSION: Status post image guided right IJ tunneled hemodialysis catheter. Signed, Yvone Neu. Miachel Roux, RPVI [facility] Electronically Signed   By: Gilmer Mor D.O.   On: 05/30/2023 15:42    Recent Labs: Lab Results  Component Value Date   WBC 7.3 05/01/2023   HGB 10.0 (L) 05/01/2023   PLT 201 05/01/2023   NA 139 05/01/2023   K 4.0 05/01/2023   CL 102 05/01/2023   CO2 17 (L) 05/01/2023   GLUCOSE 122 (H) 05/01/2023   BUN 51 (H) 05/01/2023   CREATININE 5.73 (H) 05/01/2023   BILITOT 0.5 04/19/2023   ALKPHOS 95 04/19/2023   AST 28 04/19/2023   ALT 34 04/19/2023   PROT 6.2 (L) 04/19/2023   ALBUMIN 2.4 (L) 04/25/2023   CALCIUM 8.4 (L) 05/01/2023   GFRAA 27 (L) 06/14/2021    Speciality Comments: No specialty comments available.  Procedures:  No procedures performed Allergies: Patient has no known allergies.   Assessment / Plan:     Visit Diagnoses: Seronegative rheumatoid  arthritis (HCC): He has no synovitis on examination today.  He has not had any signs or symptoms of a rheumatoid arthritis flare.  He has clinically been doing well taking Arava 10 mg 1 tablet by mouth daily.  He is tolerating Arava without any side effects and has not missed any doses recently.  No medication changes will be made at this time.  He was advised to notify us if he develops signs or symptoms of a flare.  He will follow-up in the office in 5 months or sooner if needed.  High risk medication use - Arava 10 mg 1 tablet by mouth daily. CBC and BMP updated on 05/01/23. CMP updated on 04/19/23.  He will be having close lab monitoring while receiving dialysis. Standing orders for CBC and CMP remain in place.  Discussed that he will continue to require updated CBC and CMP every 3 months while taking Arava. No recent or recurrent infections.   Idiopathic chronic gout of multiple sites without tophus - He has not had any signs or symptoms of a gout flare.  He has clinically been doing well taking allopurinol 50 mg every other day-cleared to continue per Dr. Wolfgang Phoenix. He has not needed to take colchicine recently.  He likes to keep a prescription for colchicine on hand to take if he develops signs or symptoms of a gout flare.  A sample was provided today in the office.  Patient was strongly encouraged to avoid the use of colchicine and NSAIDs. Uric acid: 6.7 on 03/28/2023.  Stiffness of both knees: Good range of motion of both knee joints noted on examination today.  No warmth or effusion noted.  He experiences some stiffness and instability in his knees when rising from a seated position at times or if standing for prolonged periods of time.  Primary osteoarthritis of both hands: No joint tenderness or synovitis noted on examination today.  Some PIP and DIP thickening consistent with osteoarthritis of both hands.  DDD (degenerative disc disease), lumbar - Previously followed by Dr. Otelia Sergeant.  Patient  underwent facet joint injections on 09/09/2022 as well as a radiofrequency ablation in the lumbar spine on 10/15/2022 performed by Dr. Alvester Morin.  He has difficulty standing for prolonged periods of time.  He plans on following up with Dr. Alvester Morin if his symptoms persist or worsen.  ESRD (end stage renal disease) (HCC) -  Followed closely by nephrology-by Dr. Wolfgang Phoenix.  Patient is receiving dialysis on Monday, Wednesday, and Fridays.  Other medical conditions are listed as follows:  Osteoporosis screening - DEXA scan from February 08, 2021 was normal.  History of hypercholesterolemia  History of hypertension: Blood pressure was 135/76 today in the office.  History of gastroesophageal reflux (GERD)  History of coronary artery disease  History of prostate cancer  Orders: No orders of the defined types were placed in this encounter.  No orders of the defined types were placed in this encounter.    Follow-Up Instructions: Return in about 5 months (around 11/17/2023) for Rheumatoid arthritis, Osteoarthritis.   Gearldine Bienenstock, PA-C  Note - This record has been created using Dragon software.  Chart creation errors have been sought, but may not always  have been located. Such creation errors do not reflect on  the standard of medical care.

## 2023-06-11 ENCOUNTER — Ambulatory Visit: Payer: Medicare HMO | Admitting: Rheumatology

## 2023-06-11 DIAGNOSIS — Z8679 Personal history of other diseases of the circulatory system: Secondary | ICD-10-CM

## 2023-06-11 DIAGNOSIS — M0600 Rheumatoid arthritis without rheumatoid factor, unspecified site: Secondary | ICD-10-CM

## 2023-06-11 DIAGNOSIS — Z8639 Personal history of other endocrine, nutritional and metabolic disease: Secondary | ICD-10-CM

## 2023-06-11 DIAGNOSIS — N2581 Secondary hyperparathyroidism of renal origin: Secondary | ICD-10-CM | POA: Diagnosis not present

## 2023-06-11 DIAGNOSIS — N170 Acute kidney failure with tubular necrosis: Secondary | ICD-10-CM | POA: Diagnosis not present

## 2023-06-11 DIAGNOSIS — M5136 Other intervertebral disc degeneration, lumbar region: Secondary | ICD-10-CM

## 2023-06-11 DIAGNOSIS — Z79899 Other long term (current) drug therapy: Secondary | ICD-10-CM

## 2023-06-11 DIAGNOSIS — M19042 Primary osteoarthritis, left hand: Secondary | ICD-10-CM

## 2023-06-11 DIAGNOSIS — Z8719 Personal history of other diseases of the digestive system: Secondary | ICD-10-CM

## 2023-06-11 DIAGNOSIS — Z992 Dependence on renal dialysis: Secondary | ICD-10-CM | POA: Diagnosis not present

## 2023-06-11 DIAGNOSIS — M25661 Stiffness of right knee, not elsewhere classified: Secondary | ICD-10-CM

## 2023-06-11 DIAGNOSIS — Z8546 Personal history of malignant neoplasm of prostate: Secondary | ICD-10-CM

## 2023-06-11 DIAGNOSIS — M79672 Pain in left foot: Secondary | ICD-10-CM

## 2023-06-11 DIAGNOSIS — M1A09X0 Idiopathic chronic gout, multiple sites, without tophus (tophi): Secondary | ICD-10-CM

## 2023-06-11 DIAGNOSIS — Z87448 Personal history of other diseases of urinary system: Secondary | ICD-10-CM

## 2023-06-11 DIAGNOSIS — Z13820 Encounter for screening for osteoporosis: Secondary | ICD-10-CM

## 2023-06-13 DIAGNOSIS — Z992 Dependence on renal dialysis: Secondary | ICD-10-CM | POA: Diagnosis not present

## 2023-06-13 DIAGNOSIS — N170 Acute kidney failure with tubular necrosis: Secondary | ICD-10-CM | POA: Diagnosis not present

## 2023-06-13 DIAGNOSIS — N2581 Secondary hyperparathyroidism of renal origin: Secondary | ICD-10-CM | POA: Diagnosis not present

## 2023-06-16 DIAGNOSIS — Z992 Dependence on renal dialysis: Secondary | ICD-10-CM | POA: Diagnosis not present

## 2023-06-16 DIAGNOSIS — N2581 Secondary hyperparathyroidism of renal origin: Secondary | ICD-10-CM | POA: Diagnosis not present

## 2023-06-16 DIAGNOSIS — N170 Acute kidney failure with tubular necrosis: Secondary | ICD-10-CM | POA: Diagnosis not present

## 2023-06-17 ENCOUNTER — Encounter: Payer: Self-pay | Admitting: Physician Assistant

## 2023-06-17 ENCOUNTER — Encounter: Payer: Medicare HMO | Attending: Nephrology | Admitting: Physician Assistant

## 2023-06-17 VITALS — BP 135/76 | HR 73 | Resp 15 | Ht 70.0 in | Wt 155.6 lb

## 2023-06-17 DIAGNOSIS — M25661 Stiffness of right knee, not elsewhere classified: Secondary | ICD-10-CM | POA: Diagnosis not present

## 2023-06-17 DIAGNOSIS — M5136 Other intervertebral disc degeneration, lumbar region: Secondary | ICD-10-CM | POA: Insufficient documentation

## 2023-06-17 DIAGNOSIS — N186 End stage renal disease: Secondary | ICD-10-CM | POA: Insufficient documentation

## 2023-06-17 DIAGNOSIS — Z79899 Other long term (current) drug therapy: Secondary | ICD-10-CM | POA: Insufficient documentation

## 2023-06-17 DIAGNOSIS — M19041 Primary osteoarthritis, right hand: Secondary | ICD-10-CM | POA: Diagnosis not present

## 2023-06-17 DIAGNOSIS — M25662 Stiffness of left knee, not elsewhere classified: Secondary | ICD-10-CM | POA: Insufficient documentation

## 2023-06-17 DIAGNOSIS — Z8719 Personal history of other diseases of the digestive system: Secondary | ICD-10-CM | POA: Insufficient documentation

## 2023-06-17 DIAGNOSIS — M19042 Primary osteoarthritis, left hand: Secondary | ICD-10-CM | POA: Insufficient documentation

## 2023-06-17 DIAGNOSIS — M79672 Pain in left foot: Secondary | ICD-10-CM

## 2023-06-17 DIAGNOSIS — M0600 Rheumatoid arthritis without rheumatoid factor, unspecified site: Secondary | ICD-10-CM | POA: Insufficient documentation

## 2023-06-17 DIAGNOSIS — Z8546 Personal history of malignant neoplasm of prostate: Secondary | ICD-10-CM | POA: Insufficient documentation

## 2023-06-17 DIAGNOSIS — Z8679 Personal history of other diseases of the circulatory system: Secondary | ICD-10-CM | POA: Insufficient documentation

## 2023-06-17 DIAGNOSIS — Z8639 Personal history of other endocrine, nutritional and metabolic disease: Secondary | ICD-10-CM | POA: Diagnosis not present

## 2023-06-17 DIAGNOSIS — Z13820 Encounter for screening for osteoporosis: Secondary | ICD-10-CM | POA: Insufficient documentation

## 2023-06-17 DIAGNOSIS — M1A09X0 Idiopathic chronic gout, multiple sites, without tophus (tophi): Secondary | ICD-10-CM | POA: Insufficient documentation

## 2023-06-17 DIAGNOSIS — Z87448 Personal history of other diseases of urinary system: Secondary | ICD-10-CM

## 2023-06-17 NOTE — Progress Notes (Signed)
Medication Samples have been provided to the patient.  Drug name: mitigare       Strength: 0.6mg         Qty: 2 bottles  LOT: [id number]  Exp.Date: 11/15/2023  Dosing instructions: Take 1 capsule by mouth daily as needed.

## 2023-06-18 DIAGNOSIS — N2581 Secondary hyperparathyroidism of renal origin: Secondary | ICD-10-CM | POA: Diagnosis not present

## 2023-06-18 DIAGNOSIS — N170 Acute kidney failure with tubular necrosis: Secondary | ICD-10-CM | POA: Diagnosis not present

## 2023-06-18 DIAGNOSIS — Z992 Dependence on renal dialysis: Secondary | ICD-10-CM | POA: Diagnosis not present

## 2023-06-20 DIAGNOSIS — N170 Acute kidney failure with tubular necrosis: Secondary | ICD-10-CM | POA: Diagnosis not present

## 2023-06-20 DIAGNOSIS — Z992 Dependence on renal dialysis: Secondary | ICD-10-CM | POA: Diagnosis not present

## 2023-06-20 DIAGNOSIS — N2581 Secondary hyperparathyroidism of renal origin: Secondary | ICD-10-CM | POA: Diagnosis not present

## 2023-06-23 DIAGNOSIS — N2581 Secondary hyperparathyroidism of renal origin: Secondary | ICD-10-CM | POA: Diagnosis not present

## 2023-06-23 DIAGNOSIS — N170 Acute kidney failure with tubular necrosis: Secondary | ICD-10-CM | POA: Diagnosis not present

## 2023-06-23 DIAGNOSIS — Z992 Dependence on renal dialysis: Secondary | ICD-10-CM | POA: Diagnosis not present

## 2023-06-25 DIAGNOSIS — Z992 Dependence on renal dialysis: Secondary | ICD-10-CM | POA: Diagnosis not present

## 2023-06-25 DIAGNOSIS — N2581 Secondary hyperparathyroidism of renal origin: Secondary | ICD-10-CM | POA: Diagnosis not present

## 2023-06-25 DIAGNOSIS — N170 Acute kidney failure with tubular necrosis: Secondary | ICD-10-CM | POA: Diagnosis not present

## 2023-06-26 ENCOUNTER — Ambulatory Visit (HOSPITAL_COMMUNITY)
Admission: RE | Admit: 2023-06-26 | Discharge: 2023-06-26 | Disposition: A | Discharge status: Home / Self Care | Payer: Medicare HMO | Source: Ambulatory Visit | Attending: Urology | Admitting: Urology

## 2023-06-26 DIAGNOSIS — R9721 Rising PSA following treatment for malignant neoplasm of prostate: Secondary | ICD-10-CM | POA: Insufficient documentation

## 2023-06-26 DIAGNOSIS — C61 Malignant neoplasm of prostate: Secondary | ICD-10-CM | POA: Diagnosis not present

## 2023-06-26 MED ORDER — PIFLIFOLASTAT F 18 (PYLARIFY) INJECTION
9.0000 | Freq: Once | INTRAVENOUS | Status: AC
  Administered 2023-06-26: 8.61 via INTRAVENOUS

## 2023-06-27 DIAGNOSIS — Z992 Dependence on renal dialysis: Secondary | ICD-10-CM | POA: Diagnosis not present

## 2023-06-27 DIAGNOSIS — N2581 Secondary hyperparathyroidism of renal origin: Secondary | ICD-10-CM | POA: Diagnosis not present

## 2023-06-27 DIAGNOSIS — N170 Acute kidney failure with tubular necrosis: Secondary | ICD-10-CM | POA: Diagnosis not present

## 2023-06-30 DIAGNOSIS — N2581 Secondary hyperparathyroidism of renal origin: Secondary | ICD-10-CM | POA: Diagnosis not present

## 2023-06-30 DIAGNOSIS — N170 Acute kidney failure with tubular necrosis: Secondary | ICD-10-CM | POA: Diagnosis not present

## 2023-06-30 DIAGNOSIS — Z992 Dependence on renal dialysis: Secondary | ICD-10-CM | POA: Diagnosis not present

## 2023-07-02 ENCOUNTER — Other Ambulatory Visit: Payer: Self-pay

## 2023-07-02 DIAGNOSIS — N170 Acute kidney failure with tubular necrosis: Secondary | ICD-10-CM | POA: Diagnosis not present

## 2023-07-02 DIAGNOSIS — N2581 Secondary hyperparathyroidism of renal origin: Secondary | ICD-10-CM | POA: Diagnosis not present

## 2023-07-02 DIAGNOSIS — Z992 Dependence on renal dialysis: Secondary | ICD-10-CM | POA: Diagnosis not present

## 2023-07-03 ENCOUNTER — Encounter: Payer: Self-pay | Admitting: Student

## 2023-07-03 ENCOUNTER — Ambulatory Visit: Payer: Medicare HMO | Attending: Student | Admitting: Student

## 2023-07-03 ENCOUNTER — Ambulatory Visit (HOSPITAL_COMMUNITY)
Admission: RE | Admit: 2023-07-03 | Discharge: 2023-07-03 | Disposition: A | Discharge status: Home / Self Care | Payer: Medicare HMO | Source: Ambulatory Visit | Attending: Cardiology | Admitting: Cardiology

## 2023-07-03 ENCOUNTER — Telehealth: Payer: Self-pay

## 2023-07-03 VITALS — BP 132/64 | HR 68 | Ht 70.0 in | Wt 158.4 lb

## 2023-07-03 DIAGNOSIS — D62 Acute posthemorrhagic anemia: Secondary | ICD-10-CM | POA: Diagnosis not present

## 2023-07-03 DIAGNOSIS — I10 Essential (primary) hypertension: Secondary | ICD-10-CM

## 2023-07-03 DIAGNOSIS — E785 Hyperlipidemia, unspecified: Secondary | ICD-10-CM | POA: Diagnosis not present

## 2023-07-03 DIAGNOSIS — R0602 Shortness of breath: Secondary | ICD-10-CM

## 2023-07-03 DIAGNOSIS — I2510 Atherosclerotic heart disease of native coronary artery without angina pectoris: Secondary | ICD-10-CM | POA: Diagnosis not present

## 2023-07-03 DIAGNOSIS — I4891 Unspecified atrial fibrillation: Secondary | ICD-10-CM | POA: Insufficient documentation

## 2023-07-03 DIAGNOSIS — I480 Paroxysmal atrial fibrillation: Secondary | ICD-10-CM

## 2023-07-03 DIAGNOSIS — Z992 Dependence on renal dialysis: Secondary | ICD-10-CM

## 2023-07-03 DIAGNOSIS — N186 End stage renal disease: Secondary | ICD-10-CM

## 2023-07-03 LAB — ECHOCARDIOGRAM COMPLETE
Area-P 1/2: 3.27 cm2
S' Lateral: 3.8 cm

## 2023-07-03 MED ORDER — ROSUVASTATIN CALCIUM 40 MG PO TABS: 40.0000 mg | ORAL_TABLET | Freq: Every day | ORAL | 3 refills | Status: DC

## 2023-07-03 MED ORDER — HYDRALAZINE HCL 50 MG PO TABS: 50.0000 mg | ORAL_TABLET | Freq: Two times a day (BID) | ORAL | 3 refills | Status: DC

## 2023-07-03 MED ORDER — AMLODIPINE BESYLATE 10 MG PO TABS
10.0000 mg | ORAL_TABLET | Freq: Every day | ORAL | 3 refills | Status: DC
Start: 2023-07-03 — End: 2023-09-25

## 2023-07-03 NOTE — Patient Instructions (Signed)
Medication Instructions:   Decrease Hydralazine to 50 mg Two Times Daily   *If you need a refill on your cardiac medications before your next appointment, please call your pharmacy*   Lab Work: NONE   If you have labs (blood work) drawn today and your tests are completely normal, you will receive your results only by: MyChart Message (if you have MyChart) OR A paper copy in the mail If you have any lab test that is abnormal or we need to change your treatment, we will call you to review the results.   Testing/Procedures: NONE    Follow-Up: At [facility], you and your health needs are our priority.  As part of our continuing mission to provide you with exceptional heart care, we have created designated Provider Care Teams.  These Care Teams include your primary Cardiologist (physician) and Advanced Practice Providers (APPs -  Physician Assistants and Nurse Practitioners) who all work together to provide you with the care you need, when you need it.  We recommend signing up for the patient portal called "MyChart".  Sign up information is provided on this After Visit Summary.  MyChart is used to connect with patients for Virtual Visits (Telemedicine).  Patients are able to view lab/test results, encounter notes, upcoming appointments, etc.  Non-urgent messages can be sent to your provider as well.   To learn more about what you can do with MyChart, go to [email or web address].    Your next appointment:   6 month(s)  Provider:   You may see Dina Rich, MD or one of the following Advanced Practice Providers on your designated Care Team:   Randall An, PA-C  Jacolyn Reedy, [place]     Other Instructions Thank you for choosing [facility]!

## 2023-07-03 NOTE — Telephone Encounter (Signed)
-----   Message from Bertram Millard Dahlstedt sent at 07/02/2023  6:20 PM EDT ----- I called pt w/ results. Please book him to see me 1st Tues in Aug--ok to double book--to discuss ADT. Work on getting PA for firmagon 240 mg ----- Message ----- From: Troy Sine, CMA Sent: 06/30/2023   5:07 PM EDT To: Marcine Matar, MD  Please review

## 2023-07-03 NOTE — Progress Notes (Signed)
*  PRELIMINARY RESULTS* Echocardiogram 2D Echocardiogram has been performed.  Jacob Farmer 07/03/2023, 1:42 PM

## 2023-07-03 NOTE — Progress Notes (Signed)
Cardiology Office Note    Date:  07/03/2023  ID:  Jacob Farmer, DOB 1945-12-21, MRN [id number] Cardiologist: Dina Rich, MD    History of Present Illness:    Jacob Farmer is a 77 y.o. male with past medical history of CAD (s/p stents in 1999 to LCx and RCA with CTO of LAD), paroxysmal atrial fibrillation, tachy-brady syndrome, HFpEF, orthostatic hypotension, HTN, HLD, Type 2 DM, RA, OA, history of prostate cancer and ESRD who presents to the office today for 6-week follow-up.  He was last examined by Charlsie Quest, NP in 05/2023 following a recent admission for anemia and AKI. He was found to have an upper GI bleed and received a total of 4 units pRBC's. 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 At the time of his visit, he reported still having dyspnea on exertion, orthopnea and lower extremity edema. He was only taking Lasix 20 mg daily and he was encouraged to take this as twice daily dosing and a follow-up echocardiogram was recommended which is scheduled for today.  In the interim, it appears he did undergo placement of a right IJ on 05/30/2023 and was started on dialysis given worsening renal function.  In talking with the patient and his wife today, he reports his breathing significantly improved after being started on HD. No recent dyspnea on exertion, orthopnea, PND or pitting edema. He has noticed his BP drops during HD and he also has soft BP on his non-HD days. He denies any recent chest pain or palpitations. No recent melena, hematochezia or hematuria.    Studies Reviewed:   EKG: EKG is not ordered today.  Echocardiogram: 07/2020 IMPRESSIONS     1. Left ventricular ejection fraction, by estimation, is 55 to 60%. The  left ventricle has normal function. The left ventricle has no regional  wall motion abnormalities. There is severe left ventricular hypertrophy.  Left ventricular diastolic parameters   are consistent with Grade  II diastolic dysfunction (pseudonormalization).  Elevated left atrial pressure.   2. Right ventricular systolic function is normal. The right ventricular  size is normal. There is mildly elevated pulmonary artery systolic  pressure.   3. The mitral valve is normal in structure. Mild mitral valve  regurgitation. No evidence of mitral stenosis.   4. The aortic valve is tricuspid. Aortic valve regurgitation is mild. No  aortic stenosis is present.   5. The inferior vena cava is normal in size with greater than 50%  respiratory variability, suggesting right atrial pressure of 3 mmHg.   Event Monitor: 08/2022 NSR with sinus brady and sinus tachy Occaisional PAC's and PVC's No VT or SVT or atrial fib No prolonged pauses Noise artifact is present and limits interpretation    Echocardiogram: 07/02/2023 IMPRESSIONS     1. Left ventricular ejection fraction, by estimation, is 50 to 55%. The  left ventricle has low normal function. The left ventricle demonstrates  regional wall motion abnormalities (see scoring diagram/findings for  description). There is moderate left  ventricular hypertrophy. Left ventricular diastolic parameters are  consistent with Grade I diastolic dysfunction (impaired relaxation).   2. Right ventricular systolic function is normal. The right ventricular  size is normal. There is normal pulmonary artery systolic pressure.   3. Left atrial size was moderately dilated.   4. Right atrial size was mildly dilated.   5. The mitral valve is normal in structure. Trivial mitral valve  regurgitation. No evidence of mitral  stenosis.   6. The aortic valve is tricuspid. Aortic valve regurgitation is not  visualized. No aortic stenosis is present.   7. The inferior vena cava is normal in size with greater than 50%  respiratory variability, suggesting right atrial pressure of 3 mmHg.   Comparison(s): Changes from prior study are noted. MR and AR resolved.  LVEF 50-55% with  RWMA.    Risk Assessment/Calculations:    CHA2DS2-VASc Score = 6   This indicates a 9.7% annual risk of stroke. The patient's score is based upon: CHF History: 1 HTN History: 1 Diabetes History: 1 Stroke History: 0 Vascular Disease History: 1 Age Score: 2 Gender Score: 0   Physical Exam:   VS:  BP 132/64   Pulse 68   Ht 5\' 10"  (1.778 m)   Wt 158 lb 6.4 oz (71.8 kg)   SpO2 100%   BMI 22.73 kg/m    Wt Readings from Last 3 Encounters:  07/03/23 158 lb 6.4 oz (71.8 kg)  06/17/23 155 lb 9.6 oz (70.6 kg)  06/02/23 164 lb 6.4 oz (74.6 kg)     GEN: Well nourished, well developed male appearing in no acute distress NECK: No JVD; No carotid bruits CARDIAC: RRR, no murmurs, rubs, gallops RESPIRATORY:  Clear to auscultation without rales, wheezing or rhonchi  ABDOMEN: Appears non-distended. No obvious abdominal masses. EXTREMITIES: No clubbing or cyanosis. No pitting edema.  Distal pedal pulses are 2+ bilaterally.   Assessment and Plan:   1. CAD - He has a history of remote stents. Echo earlier today showed his EF was mildly reduced at 50-55% with WMA noted. He reports his prior angina was diaphoresis but he did not experience chest pain or significant dyspnea in the past. We discussed the possibility of a repeat stress test today as he would now be a candidate for a cardiac catheterization if abnormal since he is on HD. He does not wish to pursue a stress test at this time. I encouraged him to make [place] aware if he changes his mind or develops new symptoms in the interim.  - Continue statin therapy. Not on a beta-blocker due to prior bradycardia and no longer on ASA given prior GIB and anemia.   2. Paroxysmal Atrial Fibrillation complicated by Tachy-brady syndrome - He denies any recent palpitations and HR is in the 60's today. He has not been on AV nodal blocking agents given prior bradycardia. Previously evaluated by Dr. Ladona Ridgel and no indication for PPM placement at that time.   - He is no longer on anticoagulation given his prior GIB and anemia. We reviewed the possibility of Watchman Device placement in the future if able to tolerate short-term anticoagulation but he is not interested in this currently.    3. Dyspnea on Exertion - This has significantly improved since being on HD. Will defer diuretic dosing to Nephrology as he is still producing urine.   4. HTN with Orthostatic Hypotension - BP is at 132/64 during today's visit and he has been having hypotension as discussed above during HD and on non-HD days. He is currently taking Amlodipine 10mg  daily and Hydralazine 100mg  BID. Will reduce Hydralazine to 50mg  BID. I encouraged him to follow BP and report back with results. If he develops elevated BP, may just need to hold Hydralazine the mornings of his HD sessions.    5. HLD - LDL was at 42 in 01/2023. Continue Crestor 40mg  daily.   6. ESRD - Creatinine had trended up to 6.09 by  labs in 05/2023 and he was started on HD by Nephrology.   7. Anemia - Hgb was at 8.8 when checked in 05/2023. He did have repeat labs with Nephrology in the interim and we will request a copy of these. No reports of active bleeding.    Signed, Ellsworth Lennox, PA-C

## 2023-07-03 NOTE — Telephone Encounter (Signed)
Patient is made aware of appointment and voiced understanding.

## 2023-07-04 DIAGNOSIS — N2581 Secondary hyperparathyroidism of renal origin: Secondary | ICD-10-CM | POA: Diagnosis not present

## 2023-07-04 DIAGNOSIS — Z992 Dependence on renal dialysis: Secondary | ICD-10-CM | POA: Diagnosis not present

## 2023-07-04 DIAGNOSIS — N170 Acute kidney failure with tubular necrosis: Secondary | ICD-10-CM | POA: Diagnosis not present

## 2023-07-07 ENCOUNTER — Other Ambulatory Visit: Payer: Self-pay | Admitting: Rheumatology

## 2023-07-07 ENCOUNTER — Ambulatory Visit (INDEPENDENT_AMBULATORY_CARE_PROVIDER_SITE_OTHER): Payer: Medicare HMO | Admitting: Gastroenterology

## 2023-07-07 DIAGNOSIS — M0600 Rheumatoid arthritis without rheumatoid factor, unspecified site: Secondary | ICD-10-CM

## 2023-07-07 DIAGNOSIS — Z992 Dependence on renal dialysis: Secondary | ICD-10-CM | POA: Diagnosis not present

## 2023-07-07 DIAGNOSIS — N2581 Secondary hyperparathyroidism of renal origin: Secondary | ICD-10-CM | POA: Diagnosis not present

## 2023-07-07 DIAGNOSIS — N170 Acute kidney failure with tubular necrosis: Secondary | ICD-10-CM | POA: Diagnosis not present

## 2023-07-09 ENCOUNTER — Ambulatory Visit: Payer: Medicare HMO | Admitting: Dermatology

## 2023-07-09 VITALS — BP 134/69 | HR 61

## 2023-07-09 DIAGNOSIS — L209 Atopic dermatitis, unspecified: Secondary | ICD-10-CM | POA: Diagnosis not present

## 2023-07-09 DIAGNOSIS — Z7189 Other specified counseling: Secondary | ICD-10-CM

## 2023-07-09 DIAGNOSIS — Z79899 Other long term (current) drug therapy: Secondary | ICD-10-CM | POA: Diagnosis not present

## 2023-07-09 DIAGNOSIS — L2089 Other atopic dermatitis: Secondary | ICD-10-CM

## 2023-07-09 DIAGNOSIS — N2581 Secondary hyperparathyroidism of renal origin: Secondary | ICD-10-CM | POA: Diagnosis not present

## 2023-07-09 DIAGNOSIS — Z992 Dependence on renal dialysis: Secondary | ICD-10-CM | POA: Diagnosis not present

## 2023-07-09 DIAGNOSIS — L821 Other seborrheic keratosis: Secondary | ICD-10-CM | POA: Diagnosis not present

## 2023-07-09 DIAGNOSIS — N186 End stage renal disease: Secondary | ICD-10-CM | POA: Diagnosis not present

## 2023-07-09 MED ORDER — DUPIXENT 300 MG/2ML ~~LOC~~ SOAJ: 300.0000 mg | SUBCUTANEOUS | 2 refills | Status: DC

## 2023-07-09 NOTE — Patient Instructions (Signed)
Due to recent changes in healthcare laws, you may see results of your pathology and/or laboratory studies on MyChart before the doctors have had a chance to review them. We understand that in some cases there may be results that are confusing or concerning to you. Please understand that not all results are received at the same time and often the doctors may need to interpret multiple results in order to provide you with the best plan of care or course of treatment. Therefore, we ask that you please give us 2 business days to thoroughly review all your results before contacting the office for clarification. Should we see a critical lab result, you will be contacted sooner.   If You Need Anything After Your Visit  If you have any questions or concerns for your doctor, please call our main line at [phone] and press option 4 to reach your doctor's medical assistant. If no one answers, please leave a voicemail as directed and we will return your call as soon as possible. Messages left after 4 pm will be answered the following business day.   You may also send us a message via MyChart. We typically respond to MyChart messages within 1-2 business days.  For prescription refills, please ask your pharmacy to contact our office. Our fax number is [phone].  If you have an urgent issue when the clinic is closed that cannot wait until the next business day, you can page your doctor at the number below.    Please note that while we do our best to be available for urgent issues outside of office hours, we are not available 24/7.   If you have an urgent issue and are unable to reach us, you may choose to seek medical care at your doctor's office, retail clinic, urgent care center, or emergency room.  If you have a medical emergency, please immediately call 911 or go to the emergency department.  Pager Numbers  - Dr. Kowalski: [phone]  - Dr. Moye: [phone]  - Dr. Stewart:  [phone]  In the event of inclement weather, please call our main line at [phone] for an update on the status of any delays or closures.  Dermatology Medication Tips: Please keep the boxes that topical medications come in in order to help keep track of the instructions about where and how to use these. Pharmacies typically print the medication instructions only on the boxes and not directly on the medication tubes.   If your medication is too expensive, please contact our office at [phone] option 4 or send us a message through MyChart.   We are unable to tell what your co-pay for medications will be in advance as this is different depending on your insurance coverage. However, we may be able to find a substitute medication at lower cost or fill out paperwork to get insurance to cover a needed medication.   If a prior authorization is required to get your medication covered by your insurance company, please allow us 1-2 business days to complete this process.  Drug prices often vary depending on where the prescription is filled and some pharmacies may offer cheaper prices.  The website www.goodrx.com contains coupons for medications through different pharmacies. The prices here do not account for what the cost may be with help from insurance (it may be cheaper with your insurance), but the website can give you the price if you did not use any insurance.  - You can print the associated coupon and take it with   your prescription to the pharmacy.  - You may also stop by our office during regular business hours and pick up a GoodRx coupon card.  - If you need your prescription sent electronically to a different pharmacy, notify our office through [facility] MyChart or by phone at [phone] option 4.     Si Usted Necesita Algo Despus de Su Visita  Tambin puede enviarnos un mensaje a travs de MyChart. Por lo general respondemos a los mensajes de MyChart en el transcurso de 1 a 2  das hbiles.  Para renovar recetas, por favor pida a su farmacia que se ponga en contacto con nuestra oficina. Nuestro nmero de fax es el [phone].  Si tiene un asunto urgente cuando la clnica est cerrada y que no puede esperar hasta el siguiente da hbil, puede llamar/localizar a su doctor(a) al nmero que aparece a continuacin.   Por favor, tenga en cuenta que aunque hacemos todo lo posible para estar disponibles para asuntos urgentes fuera del horario de oficina, no estamos disponibles las 24 horas del da, los 7 das de la semana.   Si tiene un problema urgente y no puede comunicarse con nosotros, puede optar por buscar atencin mdica  en el consultorio de su doctor(a), en una clnica privada, en un centro de atencin urgente o en una sala de emergencias.  Si tiene una emergencia mdica, por favor llame inmediatamente al 911 o vaya a la sala de emergencias.  Nmeros de bper  - Dr. Kowalski: [phone]  - Dra. Moye: [phone]  - Dra. Stewart: [phone]  En caso de inclemencias del tiempo, por favor llame a nuestra lnea principal al [phone] para una actualizacin sobre el estado de cualquier retraso o cierre.  Consejos para la medicacin en dermatologa: Por favor, guarde las cajas en las que vienen los medicamentos de uso tpico para ayudarle a seguir las instrucciones sobre dnde y cmo usarlos. Las farmacias generalmente imprimen las instrucciones del medicamento slo en las cajas y no directamente en los tubos del medicamento.   Si su medicamento es muy caro, por favor, pngase en contacto con nuestra oficina llamando al [phone] y presione la opcin 4 o envenos un mensaje a travs de MyChart.   No podemos decirle cul ser su copago por los medicamentos por adelantado ya que esto es diferente dependiendo de la cobertura de su seguro. Sin embargo, es posible que podamos encontrar un medicamento sustituto a menor costo o llenar un formulario para que el  seguro cubra el medicamento que se considera necesario.   Si se requiere una autorizacin previa para que su compaa de seguros cubra su medicamento, por favor permtanos de 1 a 2 das hbiles para completar este proceso.  Los precios de los medicamentos varan con frecuencia dependiendo del lugar de dnde se surte la receta y alguna farmacias pueden ofrecer precios ms baratos.  El sitio web www.goodrx.com tiene cupones para medicamentos de diferentes farmacias. Los precios aqu no tienen en cuenta lo que podra costar con la ayuda del seguro (puede ser ms barato con su seguro), pero el sitio web puede darle el precio si no utiliz ningn seguro.  - Puede imprimir el cupn correspondiente y llevarlo con su receta a la farmacia.  - Tambin puede pasar por nuestra oficina durante el horario de atencin regular y recoger una tarjeta de cupones de GoodRx.  - Si necesita que su receta se enve electrnicamente a una farmacia diferente, informe a nuestra oficina a travs de MyChart de [facility]   o por telfono llamando al [phone] y presione la opcin 4.  

## 2023-07-09 NOTE — Progress Notes (Signed)
   Follow-Up Visit   Subjective  Jacob Farmer is a 77 y.o. male who presents for the following: F/u Atopic Dermatitis on his trunk and extremtitis treating with Dupixent injection every 2 weeks with a good response and using otc Cerave cream daily.  The patient has spots, moles and lesions to be evaluated, some may be new or changing and the patient may have concern these could be cancer.  Patient was hospitalized a few months ago he is now on dialysis   The following portions of the chart were reviewed this encounter and updated as appropriate: medications, allergies, medical history  Review of Systems:  No other skin or systemic complaints except as noted in HPI or Assessment and Plan.  Objective  Well appearing patient in no apparent distress; mood and affect are within normal limits. Areas Examined:face, trunk, extremities  Relevant physical exam findings are noted in the Assessment and Plan.   Assessment & Plan   ATOPIC DERMATITIS Exam: dry skin  10% BSA Chronic and persistent condition with duration or expected duration over one year. Condition is symptomatic/ bothersome to patient. Not currently at goal, but almost completely clear.  Pt improved and very pleased with treatment and wants to continue.  Atopic dermatitis (eczema) is a chronic, relapsing, pruritic condition that can significantly affect quality of life. It is often associated with allergic rhinitis and/or asthma and can require treatment with topical medications, phototherapy, or in severe cases biologic injectable medication (Dupixent; Adbry) or Oral JAK inhibitors.  Treatment Plan: Continue Dupixent injection every 2 weeks Continue otc Cerave cream daily   Dupilumab (Dupixent) is a treatment given by injection for adults and children with moderate-to-severe atopic dermatitis. Goal is control of skin condition, not cure. It is given as 2 injections at the first dose followed by 1 injection ever 2 weeks  thereafter.  Young children are dosed monthly.  Potential side effects include allergic reaction, herpes infections, injection site reactions and conjunctivitis (inflammation of the eyes).  The use of Dupixent requires long term medication management, including periodic office visits.   Recommend gentle skin care.  SEBORRHEIC KERATOSIS - Stuck-on, waxy, tan-brown papules and/or plaques  - Benign-appearing - Discussed benign etiology and prognosis. - Observe - Call for any changes  Return in about 6 months (around 01/09/2024) for Dupixent .  IAngelique Holm, CMA, am acting as scribe for Armida Sans, MD .   Documentation: I have reviewed the above documentation for accuracy and completeness, and I agree with the above.  Armida Sans, MD

## 2023-07-10 ENCOUNTER — Other Ambulatory Visit: Payer: Self-pay

## 2023-07-10 DIAGNOSIS — R9721 Rising PSA following treatment for malignant neoplasm of prostate: Secondary | ICD-10-CM

## 2023-07-10 MED ORDER — DEGARELIX ACETATE(240 MG DOSE) 120 MG/VIAL ~~LOC~~ SOLR
240.0000 mg | Freq: Once | SUBCUTANEOUS | Status: DC
Start: 2023-07-22 — End: 2023-09-25

## 2023-07-11 DIAGNOSIS — N186 End stage renal disease: Secondary | ICD-10-CM | POA: Diagnosis not present

## 2023-07-11 DIAGNOSIS — N2581 Secondary hyperparathyroidism of renal origin: Secondary | ICD-10-CM | POA: Diagnosis not present

## 2023-07-11 DIAGNOSIS — Z992 Dependence on renal dialysis: Secondary | ICD-10-CM | POA: Diagnosis not present

## 2023-07-13 ENCOUNTER — Encounter: Payer: Self-pay | Admitting: Dermatology

## 2023-07-14 DIAGNOSIS — Z992 Dependence on renal dialysis: Secondary | ICD-10-CM | POA: Diagnosis not present

## 2023-07-14 DIAGNOSIS — N2581 Secondary hyperparathyroidism of renal origin: Secondary | ICD-10-CM | POA: Diagnosis not present

## 2023-07-14 DIAGNOSIS — N186 End stage renal disease: Secondary | ICD-10-CM | POA: Diagnosis not present

## 2023-07-16 DIAGNOSIS — Z992 Dependence on renal dialysis: Secondary | ICD-10-CM | POA: Diagnosis not present

## 2023-07-16 DIAGNOSIS — N2581 Secondary hyperparathyroidism of renal origin: Secondary | ICD-10-CM | POA: Diagnosis not present

## 2023-07-16 DIAGNOSIS — N186 End stage renal disease: Secondary | ICD-10-CM | POA: Diagnosis not present

## 2023-07-17 ENCOUNTER — Other Ambulatory Visit: Payer: Self-pay | Admitting: *Deleted

## 2023-07-17 ENCOUNTER — Other Ambulatory Visit: Payer: Self-pay

## 2023-07-17 DIAGNOSIS — N184 Chronic kidney disease, stage 4 (severe): Secondary | ICD-10-CM

## 2023-07-17 DIAGNOSIS — Z992 Dependence on renal dialysis: Secondary | ICD-10-CM | POA: Diagnosis not present

## 2023-07-17 DIAGNOSIS — N186 End stage renal disease: Secondary | ICD-10-CM | POA: Diagnosis not present

## 2023-07-17 MED ORDER — DUPIXENT 300 MG/2ML ~~LOC~~ SOAJ: 300.0000 mg | SUBCUTANEOUS | 1 refills | Status: DC

## 2023-07-17 NOTE — Progress Notes (Signed)
Patient needs 6 month RF to [facility] and not 3 months. RX corrected. aw

## 2023-07-18 DIAGNOSIS — Z992 Dependence on renal dialysis: Secondary | ICD-10-CM | POA: Diagnosis not present

## 2023-07-18 DIAGNOSIS — N186 End stage renal disease: Secondary | ICD-10-CM | POA: Diagnosis not present

## 2023-07-18 DIAGNOSIS — N2581 Secondary hyperparathyroidism of renal origin: Secondary | ICD-10-CM | POA: Diagnosis not present

## 2023-07-21 DIAGNOSIS — Z992 Dependence on renal dialysis: Secondary | ICD-10-CM | POA: Diagnosis not present

## 2023-07-21 DIAGNOSIS — N186 End stage renal disease: Secondary | ICD-10-CM | POA: Diagnosis not present

## 2023-07-21 DIAGNOSIS — N2581 Secondary hyperparathyroidism of renal origin: Secondary | ICD-10-CM | POA: Diagnosis not present

## 2023-07-22 ENCOUNTER — Encounter: Payer: Self-pay | Admitting: Urology

## 2023-07-22 ENCOUNTER — Ambulatory Visit: Payer: Medicare HMO | Admitting: Urology

## 2023-07-22 VITALS — BP 125/69 | HR 74

## 2023-07-22 DIAGNOSIS — C775 Secondary and unspecified malignant neoplasm of intrapelvic lymph nodes: Secondary | ICD-10-CM | POA: Diagnosis not present

## 2023-07-22 DIAGNOSIS — Z8546 Personal history of malignant neoplasm of prostate: Secondary | ICD-10-CM | POA: Diagnosis not present

## 2023-07-22 DIAGNOSIS — R9721 Rising PSA following treatment for malignant neoplasm of prostate: Secondary | ICD-10-CM

## 2023-07-22 DIAGNOSIS — C61 Malignant neoplasm of prostate: Secondary | ICD-10-CM

## 2023-07-22 LAB — URINALYSIS, ROUTINE W REFLEX MICROSCOPIC
Bilirubin, UA: NEGATIVE
Glucose, UA: NEGATIVE
Leukocytes,UA: NEGATIVE
Nitrite, UA: NEGATIVE
RBC, UA: NEGATIVE
Specific Gravity, UA: 1.02 (ref 1.005–1.030)
Urobilinogen, Ur: 1 mg/dL (ref 0.2–1.0)
pH, UA: 6 (ref 5.0–7.5)

## 2023-07-22 LAB — MICROSCOPIC EXAMINATION: Bacteria, UA: NONE SEEN

## 2023-07-22 MED ORDER — LEUPROLIDE ACETATE (4 MONTH) 30 MG ~~LOC~~ KIT
30.0000 mg | PACK | Freq: Once | SUBCUTANEOUS | Status: DC
Start: 2023-07-22 — End: 2023-07-22

## 2023-07-22 MED ORDER — DEGARELIX ACETATE(240 MG DOSE) 120 MG/VIAL ~~LOC~~ SOLR
240.0000 mg | Freq: Once | SUBCUTANEOUS | Status: DC
Start: 2023-07-22 — End: 2023-07-22

## 2023-07-22 NOTE — Progress Notes (Signed)
History of Present Illness:   4.23.2024: 77 yo male is here for E/M of elevated PSA--referred by Dr Allena Katz.  PSA in Oct 2022--0.69 PSA 2.16.2024--7.49.  He does have a h/o PCA--  He underwent ultrasound and biopsy of his prostate on 9.27.2016. Prostatic volume 17.6 cc. PSA 4.7. PSA density 0.27.  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   He underwent brachytherapy on 1.26.2017.   Last seen in 2020 for PCa  followup. PSA @ that time 0.20.  [facility].  Now on iron infusions.  Still with low energy.  No gross hematuria, no dysuria.  He has stage IV CKD.  Followed by Dr. Wolfgang Phoenix.  7.12.2024: PSMA PET scan-- 1. Radiotracer avid left external iliac, left periaortic, and left pelvic lymph nodes compatible with nodal disease involvement. 2. No evidence of local recurrence in the prostate bed. 3. No evidence of radiotracer avid metastatic disease in the chest. 4. Trace bilateral pleural effusions. 5. 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 Recommend follow-up ultrasound every 12 months and vascular consultation. 6.  Aortic Atherosclerosis (ICD10-I70.0).  8.6.2024: Here today for follow-up and to review the above results as well as hopefully start ADT.  He is getting washed out from his dialysis which was started about 7 weeks ago. Past Medical History:  Diagnosis Date   CAD (coronary artery disease)    Acute myocardial infarction treated with TPA in 1990; 1999-stents to circumflex and RCA; residual total occlusion of the left anterior descending; normal ejection fraction; stress nuclear in 2001-distal anteroseptal ischemia; normal LV function   CKD (chronic kidney disease) stage 4, GFR 15-29 ml/min (HCC)    First degree heart block    History of gastric ulcer    History of kidney stones    History of MI (myocardial  infarction)    1990-  treated w/ TPA   Hyperlipidemia    Hypertension    Jaundice    OA (osteoarthritis)    Prediabetes    Prostate cancer (HCC)    Stage T1c , Gleason 3+4,  PSA 4.7,  vol 24cc--  scheduled for radiative seed implants   Psoriatic arthritis (HCC)    RA (rheumatoid arthritis) (HCC)    Dr. Beatrix Fetters   Sinus bradycardia    Wears dentures     Past Surgical History:  Procedure Laterality Date   BIOPSY  08/27/2019   Procedure: BIOPSY;  Surgeon: Malissa Hippo, MD;  [facility];  Service: Endoscopy;;  gastric   BIOPSY  04/03/2022   Procedure: BIOPSY;  Surgeon: Dolores Frame, MD;  [facility]: [facility];  Service: Gastroenterology;;   BIOPSY  04/21/2023   Procedure: BIOPSY;  Surgeon: Lanelle Bal, DO;  [facility]: [facility];  Service: Endoscopy;;   callus removal Left 09/13/2022   left foot   CARDIOVASCULAR STRESS TEST  2001  per Dr Dietrich Pates clinic note   distal anteroseptal ischemia,  normal LVF   COLONOSCOPY  last one 2006 (approx)   COLONOSCOPY WITH PROPOFOL N/A 04/03/2022   Procedure: COLONOSCOPY WITH PROPOFOL;  Surgeon: Dolores Frame, MD;  Location: [facility];  Service: Gastroenterology;  Laterality: N/A;  945   CORONARY ANGIOPLASTY WITH STENT PLACEMENT  1999   DES x2  to CFX and RCA/  residual total occlusion LAD,  normal LVEF   ENTEROSCOPY N/A 04/24/2023   Procedure:  ENTEROSCOPY;  Surgeon: Dolores Frame, MD;  [facility];  Service: Gastroenterology;  Laterality: N/A;   ESOPHAGOGASTRODUODENOSCOPY (EGD) WITH PROPOFOL N/A 08/27/2019   Procedure: ESOPHAGOGASTRODUODENOSCOPY (EGD) WITH PROPOFOL;  Surgeon: Malissa Hippo, MD;  [facility]: [facility];  Service: Endoscopy;  Laterality: N/A;  10:10   ESOPHAGOGASTRODUODENOSCOPY (EGD) WITH PROPOFOL N/A 04/03/2022   Procedure: ESOPHAGOGASTRODUODENOSCOPY (EGD) WITH PROPOFOL;  Surgeon: Dolores Frame, MD;  [facility];  Service:  Gastroenterology;  Laterality: N/A;   ESOPHAGOGASTRODUODENOSCOPY (EGD) WITH PROPOFOL N/A 02/16/2023   Procedure: ESOPHAGOGASTRODUODENOSCOPY (EGD) WITH PROPOFOL;  Surgeon: Dolores Frame, MD;  [facility];  Service: Gastroenterology;  Laterality: N/A;   ESOPHAGOGASTRODUODENOSCOPY (EGD) WITH PROPOFOL N/A 04/21/2023   Procedure: ESOPHAGOGASTRODUODENOSCOPY (EGD) WITH PROPOFOL;  Surgeon: Lanelle Bal, DO;  [facility];  Service: Endoscopy;  Laterality: N/A;   EYE SURGERY Bilateral    cataract   GIVENS CAPSULE STUDY N/A 04/22/2023   Procedure: GIVENS CAPSULE STUDY;  Surgeon: Dolores Frame, MD;  [facility]: [facility];  Service: Gastroenterology;  Laterality: N/A;   HEMOSTASIS CLIP PLACEMENT  04/24/2023   Procedure: HEMOSTASIS CLIP PLACEMENT;  Surgeon: Dolores Frame, MD;  [facility];  Service: Gastroenterology;;   HOT HEMOSTASIS  04/03/2022   Procedure: HOT HEMOSTASIS (ARGON PLASMA COAGULATION/BICAP);  Surgeon: Marguerita Merles, Reuel Boom, MD;  [facility]: [facility];  Service: Gastroenterology;;   HOT HEMOSTASIS  04/24/2023   Procedure: HOT HEMOSTASIS (ARGON PLASMA COAGULATION/BICAP);  Surgeon: Marguerita Merles, Reuel Boom, MD;  [facility]: [facility];  Service: Gastroenterology;;   IR FLUORO GUIDE CV LINE RIGHT  05/30/2023   IR US GUIDE VASC ACCESS RIGHT  05/30/2023   POLYPECTOMY  04/03/2022   Procedure: POLYPECTOMY;  Surgeon: Dolores Frame, MD;  [facility];  Service: Gastroenterology;;   POSTERIOR CERVICAL FUSION/FORAMINOTOMY N/A 05/26/2017   Procedure: RIGHT C4-5 FORAMINOTOMY WITH EXCISION OF HERNIATED DISC;  Surgeon: Kerrin Champagne, MD;  [facility];  Service: Orthopedics;  Laterality: N/A;   RADIOACTIVE SEED IMPLANT N/A 01/11/2016   Procedure: RADIOACTIVE SEED IMPLANT/BRACHYTHERAPY IMPLANT;  Surgeon: Marcine Matar, MD;  Location: [facility];  Service: Urology;  Laterality: N/A;   73   seeds implanted no seeds founds in bladder   SCLEROTHERAPY  04/24/2023   Procedure: SCLEROTHERAPY;  Surgeon: Marguerita Merles, Reuel Boom, MD;  [facility];  Service: Gastroenterology;;   SUBMUCOSAL TATTOO INJECTION  04/24/2023   Procedure: SUBMUCOSAL TATTOO INJECTION;  Surgeon: Dolores Frame, MD;  [facility];  Service: Gastroenterology;;    Home Medications:  Allergies as of 07/22/2023   No Known Allergies      Medication List        Accurate as of July 22, 2023 12:31 PM. If you have any questions, ask your nurse or doctor.          allopurinol 100 MG tablet Commonly known as: ZYLOPRIM TAKE 1/2 TABLET EVERY OTHER DAY   amLODipine 10 MG tablet Commonly known as: NORVASC Take 1 tablet (10 mg total) by mouth daily.   calcitRIOL 0.25 MCG capsule Commonly known as: ROCALTROL Take 0.25 mcg by mouth. M, W, F   Colchicine 0.6 MG Caps Take 0.6 mg by mouth daily as needed (Gout).   Dupixent 300 MG/2ML Sopn Generic drug: Dupilumab Inject 300 mg into the skin every 14 (fourteen) days. Starting at day 15 for maintenance.   folic acid 400 MCG tablet Commonly known as: FOLVITE Take  400 mcg by mouth daily.   furosemide 20 MG tablet Commonly known as: LASIX Take 20 mg by mouth 2 (two) times daily.   hydrALAZINE 50 MG tablet Commonly known as: APRESOLINE Take 1 tablet (50 mg total) by mouth 2 (two) times daily.   leflunomide 10 MG tablet Commonly known as: ARAVA Take 1 tablet (10 mg total) by mouth daily.   nitroGLYCERIN 0.4 MG SL tablet Commonly known as: NITROSTAT Place 0.4 mg under the tongue every 5 (five) minutes as needed for chest pain.   pantoprazole 40 MG tablet Commonly known as: PROTONIX Take 1 tablet (40 mg total) by mouth 2 (two) times daily.   potassium chloride SA 20 MEQ tablet Commonly known as: KLOR-CON M Take 20 mEq by mouth daily.   rosuvastatin 40 MG tablet Commonly known as: CRESTOR Take 1 tablet (40 mg total)  by mouth daily.   sucralfate 1 GM/10ML suspension Commonly known as: CARAFATE Take 10 mLs (1 g total) by mouth 4 (four) times daily -  with meals and at bedtime.   triamcinolone cream 0.1 % Commonly known as: KENALOG Apply 1 Application topically daily. What changed:  when to take this reasons to take this        Allergies: No Known Allergies  Family History  Problem Relation Age of Onset   Heart attack Mother    Coronary artery disease Father    Ulcerative colitis Father    Ulcers Father    Breast cancer Sister    COPD Brother    Pancreatic cancer Brother    Healthy Sister    Diabetes Brother    Cirrhosis Son    Seizures Son     Social History:  reports that he quit smoking about 34 years ago. His smoking use included cigarettes. He started smoking about 64 years ago. He has a 30 pack-year smoking history. He has never been exposed to tobacco smoke. He has never used smokeless tobacco. He reports current alcohol use. He reports that he does not use drugs.  ROS: A complete review of systems was performed.  All systems are negative except for pertinent findings as noted.  Physical Exam:  Vital signs in last 24 hours: There were no vitals taken for this visit. Constitutional:  Alert and oriented, No acute distress Cardiovascular: Regular rate  Respiratory: Normal respiratory effort Neurologic: Grossly intact, no focal deficits Psychiatric: Normal mood and affect  I have reviewed prior pt notes  I have reviewed urinalysis results  I have independently reviewed prior imaging-PSMA PET scan results reviewed  I have reviewed prior PSA and pathology results     Impression/Assessment:  Status post brachytherapy for prostate cancer with recurrence now on pelvic nodes.  Plan:  I discussed ADT with him, risks and benefits  We will start him out on leuprolide 30 mg IM today  I will see him back in 2 months for recheck

## 2023-07-23 DIAGNOSIS — N186 End stage renal disease: Secondary | ICD-10-CM | POA: Diagnosis not present

## 2023-07-23 DIAGNOSIS — Z992 Dependence on renal dialysis: Secondary | ICD-10-CM | POA: Diagnosis not present

## 2023-07-23 DIAGNOSIS — N2581 Secondary hyperparathyroidism of renal origin: Secondary | ICD-10-CM | POA: Diagnosis not present

## 2023-07-25 DIAGNOSIS — N2581 Secondary hyperparathyroidism of renal origin: Secondary | ICD-10-CM | POA: Diagnosis not present

## 2023-07-25 DIAGNOSIS — Z992 Dependence on renal dialysis: Secondary | ICD-10-CM | POA: Diagnosis not present

## 2023-07-25 DIAGNOSIS — N186 End stage renal disease: Secondary | ICD-10-CM | POA: Diagnosis not present

## 2023-07-28 ENCOUNTER — Other Ambulatory Visit: Payer: Self-pay | Admitting: Internal Medicine

## 2023-07-28 DIAGNOSIS — N2581 Secondary hyperparathyroidism of renal origin: Secondary | ICD-10-CM | POA: Diagnosis not present

## 2023-07-28 DIAGNOSIS — N186 End stage renal disease: Secondary | ICD-10-CM | POA: Diagnosis not present

## 2023-07-28 DIAGNOSIS — Z992 Dependence on renal dialysis: Secondary | ICD-10-CM | POA: Diagnosis not present

## 2023-07-28 NOTE — Telephone Encounter (Signed)
Last Fill: 05/09/2023  Labs: 05/21/2023 Glucose 130, BUN 49, Creat. 6.09, GFR 7, BUN/Creat. Ratio 8, CO2 19, Calcium 8.5, Phosphorus 4.9, Albumin 3.7, RBC 3.10, Hgb 8.8, Hct 28.6, MCHC 30.8, RDW 15.6, Platelets 147  Next Visit: 11/18/2023  Last Visit: 06/17/2023  DX: Idiopathic chronic gout of multiple sites without tophus   Current Dose per office note 06/17/2023: allopurinol 50 mg every other day   Okay to refill Allopurinol?

## 2023-07-29 ENCOUNTER — Ambulatory Visit: Payer: Medicare HMO

## 2023-07-29 ENCOUNTER — Telehealth: Payer: Self-pay

## 2023-07-29 NOTE — Telephone Encounter (Signed)
I tried to call patient to see if they wanted to move apt out another week until they were able to get their oop cost for this injection.  No answer.  Apt canceled for now and MD routed pt message below.

## 2023-07-29 NOTE — Telephone Encounter (Signed)
Patient will cancel apt and follow up with MD as scheduled in October.

## 2023-07-29 NOTE — Telephone Encounter (Signed)
Patient call in today and states that the insurance company [employer] told patient it would be 20% co-pay for his Lupron injection. Patient is aware to contact insurance for co-pay information. Patient states they call the insurance company [employer] and there was not a definite answer on cost. Patient states he will not get his Lupron inject unless he known the exact cost of the Lupron injection. Patient is aware a message will be sent to Dr. Retta Diones making him aware about the patient not getting his Lupron injection. Patient voiced understanding

## 2023-07-30 ENCOUNTER — Ambulatory Visit: Payer: Medicare HMO | Admitting: Internal Medicine

## 2023-07-30 DIAGNOSIS — N186 End stage renal disease: Secondary | ICD-10-CM | POA: Diagnosis not present

## 2023-07-30 DIAGNOSIS — N2581 Secondary hyperparathyroidism of renal origin: Secondary | ICD-10-CM | POA: Diagnosis not present

## 2023-07-30 DIAGNOSIS — Z992 Dependence on renal dialysis: Secondary | ICD-10-CM | POA: Diagnosis not present

## 2023-07-31 ENCOUNTER — Ambulatory Visit: Payer: Medicare HMO | Admitting: Internal Medicine

## 2023-07-31 ENCOUNTER — Ambulatory Visit (INDEPENDENT_AMBULATORY_CARE_PROVIDER_SITE_OTHER)
Admission: RE | Admit: 2023-07-31 | Discharge: 2023-07-31 | Disposition: A | Discharge status: Home / Self Care | Payer: Medicare HMO | Source: Ambulatory Visit | Attending: Vascular Surgery | Admitting: Vascular Surgery

## 2023-07-31 ENCOUNTER — Ambulatory Visit (HOSPITAL_COMMUNITY)
Admission: RE | Admit: 2023-07-31 | Discharge: 2023-07-31 | Disposition: A | Discharge status: Home / Self Care | Payer: Medicare HMO | Source: Ambulatory Visit | Attending: Vascular Surgery | Admitting: Vascular Surgery

## 2023-07-31 DIAGNOSIS — N184 Chronic kidney disease, stage 4 (severe): Secondary | ICD-10-CM | POA: Diagnosis not present

## 2023-08-01 DIAGNOSIS — N2581 Secondary hyperparathyroidism of renal origin: Secondary | ICD-10-CM | POA: Diagnosis not present

## 2023-08-01 DIAGNOSIS — N186 End stage renal disease: Secondary | ICD-10-CM | POA: Diagnosis not present

## 2023-08-01 DIAGNOSIS — Z992 Dependence on renal dialysis: Secondary | ICD-10-CM | POA: Diagnosis not present

## 2023-08-04 DIAGNOSIS — N2581 Secondary hyperparathyroidism of renal origin: Secondary | ICD-10-CM | POA: Diagnosis not present

## 2023-08-04 DIAGNOSIS — N186 End stage renal disease: Secondary | ICD-10-CM | POA: Diagnosis not present

## 2023-08-04 DIAGNOSIS — Z992 Dependence on renal dialysis: Secondary | ICD-10-CM | POA: Diagnosis not present

## 2023-08-05 ENCOUNTER — Ambulatory Visit: Payer: Medicare HMO | Admitting: Internal Medicine

## 2023-08-05 ENCOUNTER — Telehealth: Payer: Self-pay

## 2023-08-05 NOTE — Telephone Encounter (Signed)
Patient scheduled for leuprolide 30 mg IM on 09/10 for [place].  His follow up is with you on 10/15 do you want PSA labs a week prior to this apt?

## 2023-08-05 NOTE — Telephone Encounter (Signed)
Patient calling to make sure he can have a shot with his visit in October with Dr. Retta Diones.  Please advise.  Call:  [phone]

## 2023-08-06 DIAGNOSIS — Z992 Dependence on renal dialysis: Secondary | ICD-10-CM | POA: Diagnosis not present

## 2023-08-06 DIAGNOSIS — N186 End stage renal disease: Secondary | ICD-10-CM | POA: Diagnosis not present

## 2023-08-06 DIAGNOSIS — N2581 Secondary hyperparathyroidism of renal origin: Secondary | ICD-10-CM | POA: Diagnosis not present

## 2023-08-07 ENCOUNTER — Ambulatory Visit (INDEPENDENT_AMBULATORY_CARE_PROVIDER_SITE_OTHER): Payer: Medicare HMO | Admitting: Internal Medicine

## 2023-08-07 ENCOUNTER — Other Ambulatory Visit: Payer: Self-pay | Admitting: Urology

## 2023-08-07 ENCOUNTER — Encounter: Payer: Self-pay | Admitting: Internal Medicine

## 2023-08-07 VITALS — BP 110/65 | HR 73 | Ht 70.0 in | Wt 157.8 lb

## 2023-08-07 DIAGNOSIS — N186 End stage renal disease: Secondary | ICD-10-CM

## 2023-08-07 DIAGNOSIS — E1122 Type 2 diabetes mellitus with diabetic chronic kidney disease: Secondary | ICD-10-CM

## 2023-08-07 DIAGNOSIS — C61 Malignant neoplasm of prostate: Secondary | ICD-10-CM

## 2023-08-07 DIAGNOSIS — I480 Paroxysmal atrial fibrillation: Secondary | ICD-10-CM | POA: Diagnosis not present

## 2023-08-07 DIAGNOSIS — N184 Chronic kidney disease, stage 4 (severe): Secondary | ICD-10-CM | POA: Diagnosis not present

## 2023-08-07 DIAGNOSIS — I10 Essential (primary) hypertension: Secondary | ICD-10-CM | POA: Diagnosis not present

## 2023-08-07 DIAGNOSIS — Z992 Dependence on renal dialysis: Secondary | ICD-10-CM

## 2023-08-07 LAB — HEMOGLOBIN A1C: Hemoglobin A1C: 5.3

## 2023-08-07 MED ORDER — HYDRALAZINE HCL 25 MG PO TABS
25.0000 mg | ORAL_TABLET | Freq: Two times a day (BID) | ORAL | 3 refills | Status: AC
Start: 2023-08-07 — End: 2023-11-05

## 2023-08-07 NOTE — Patient Instructions (Signed)
Please start taking Hydralazine 25 mg twice daily instead of 50 mg. Do not take it on the morning of dialysis.  Please continue to take medications as prescribed.  Please continue to follow low salt diet and ambulate as tolerated.

## 2023-08-07 NOTE — Assessment & Plan Note (Signed)
In sinus rhythm currently, rate controlled Was on Eliquis, had to stop due to recurrent GI bleeding Not on rate-controlling agent due to bradycardia

## 2023-08-07 NOTE — Assessment & Plan Note (Signed)
S/p radiotherapy Had rising PSA Had urology evaluation-had PET scan, has radiotracer activity in iliac and pelvic lymph nodes Planned to get leuprolide Referred to heme-onc

## 2023-08-07 NOTE — Progress Notes (Signed)
Established Patient Office Visit  Subjective:  Patient ID: Jacob Farmer, male    DOB: [birth year]  Age: 77 y.o. MRN: [id number]  CC:  Chief Complaint  Patient presents with   Diabetes    6 month fu    Coronary Artery Disease    6 month fu     HPI Jacob Farmer is a 77 y.o. male with past medical history of CAD status post stent placement, HTN, HLD, RA, ESRD on HD, prostate ca. s/p radiotherapy, gout, cervical spinal stenosis and GERD who presents for f/u of his chronic medical conditions.  CAD s/p stent placement and HTN: BP is well-controlled today.  He has had low BP on HD days. He reports dizziness due to hypotension.  Takes amlodipine 10 mg QD and hydralazine 50 mg twice daily regularly.  He had has tried avoiding hydralazine on HD days, but still feels dizzy on non-HD days.  Patient denies headache, dizziness, chest pain, dyspnea or palpitations.  He follows up with cardiology.  He is on aspirin and statin.  He was diagnosed with A Fib, but had recurrent GI bleeding with Eliquis.  Denies any signs of bleeding currently.  RA: He is on Arava for it. He goes to rheumatology clinic.  He also takes allopurinol for history of gout, denies any recent flareups.   ESRD on HD: He has started HD on MWF now. He follows up with nephrology for it.  Denies any dysuria or hematuria currently.   He has a history of allergic dermatitis, for which he takes Dupixent and sees Dr. Jorja Loa.  Prostate cancer: He had urology evaluation and PET scan for rising PSA.  He is planned to start leuprolide injections.  He currently denies any dysuria or hematuria.   Past Medical History:  Diagnosis Date   CAD (coronary artery disease)    Acute myocardial infarction treated with TPA in 1990; 1999-stents to circumflex and RCA; residual total occlusion of the left anterior descending; normal ejection fraction; stress nuclear in 2001-distal anteroseptal ischemia; normal LV function   CKD (chronic kidney disease)  stage 4, GFR 15-29 ml/min (HCC)    First degree heart block    History of gastric ulcer    History of kidney stones    History of MI (myocardial infarction)    1990-  treated w/ TPA   Hyperlipidemia    Hypertension    Jaundice    OA (osteoarthritis)    Prediabetes    Prostate cancer (HCC)    Stage T1c , Gleason 3+4,  PSA 4.7,  vol 24cc--  scheduled for radiative seed implants   Psoriatic arthritis (HCC)    RA (rheumatoid arthritis) (HCC)    Dr. Beatrix Fetters   Sinus bradycardia    Wears dentures     Past Surgical History:  Procedure Laterality Date   BIOPSY  08/27/2019   Procedure: BIOPSY;  Surgeon: Malissa Hippo, MD;  [facility];  Service: Endoscopy;;  gastric   BIOPSY  04/03/2022   Procedure: BIOPSY;  Surgeon: Dolores Frame, MD;  [facility];  Service: Gastroenterology;;   BIOPSY  04/21/2023   Procedure: BIOPSY;  Surgeon: Lanelle Bal, DO;  [facility];  Service: Endoscopy;;   callus removal Left 09/13/2022   left foot   CARDIOVASCULAR STRESS TEST  2001  per Dr Dietrich Pates clinic note   distal anteroseptal ischemia,  normal LVF   COLONOSCOPY  last one 2006 (approx)   COLONOSCOPY WITH PROPOFOL  N/A 04/03/2022   Procedure: COLONOSCOPY WITH PROPOFOL;  Surgeon: Dolores Frame, MD;  [facility]: [facility];  Service: Gastroenterology;  Laterality: N/A;  945   CORONARY ANGIOPLASTY WITH STENT PLACEMENT  1999   DES x2  to CFX and RCA/  residual total occlusion LAD,  normal LVEF   ENTEROSCOPY N/A 04/24/2023   Procedure: ENTEROSCOPY;  Surgeon: Dolores Frame, MD;  [facility];  Service: Gastroenterology;  Laterality: N/A;   ESOPHAGOGASTRODUODENOSCOPY (EGD) WITH PROPOFOL N/A 08/27/2019   Procedure: ESOPHAGOGASTRODUODENOSCOPY (EGD) WITH PROPOFOL;  Surgeon: Malissa Hippo, MD;  [facility];  Service: Endoscopy;  Laterality: N/A;  10:10   ESOPHAGOGASTRODUODENOSCOPY (EGD) WITH PROPOFOL N/A  04/03/2022   Procedure: ESOPHAGOGASTRODUODENOSCOPY (EGD) WITH PROPOFOL;  Surgeon: Dolores Frame, MD;  [facility];  Service: Gastroenterology;  Laterality: N/A;   ESOPHAGOGASTRODUODENOSCOPY (EGD) WITH PROPOFOL N/A 02/16/2023   Procedure: ESOPHAGOGASTRODUODENOSCOPY (EGD) WITH PROPOFOL;  Surgeon: Dolores Frame, MD;  [facility];  Service: Gastroenterology;  Laterality: N/A;   ESOPHAGOGASTRODUODENOSCOPY (EGD) WITH PROPOFOL N/A 04/21/2023   Procedure: ESOPHAGOGASTRODUODENOSCOPY (EGD) WITH PROPOFOL;  Surgeon: Lanelle Bal, DO;  [facility];  Service: Endoscopy;  Laterality: N/A;   EYE SURGERY Bilateral    cataract   GIVENS CAPSULE STUDY N/A 04/22/2023   Procedure: GIVENS CAPSULE STUDY;  Surgeon: Dolores Frame, MD;  [facility];  Service: Gastroenterology;  Laterality: N/A;   HEMOSTASIS CLIP PLACEMENT  04/24/2023   Procedure: HEMOSTASIS CLIP PLACEMENT;  Surgeon: Dolores Frame, MD;  [facility];  Service: Gastroenterology;;   HOT HEMOSTASIS  04/03/2022   Procedure: HOT HEMOSTASIS (ARGON PLASMA COAGULATION/BICAP);  Surgeon: Marguerita Merles, Reuel Boom, MD;  [facility];  Service: Gastroenterology;;   HOT HEMOSTASIS  04/24/2023   Procedure: HOT HEMOSTASIS (ARGON PLASMA COAGULATION/BICAP);  Surgeon: Marguerita Merles, Reuel Boom, MD;  [facility];  Service: Gastroenterology;;   IR FLUORO GUIDE CV LINE RIGHT  05/30/2023   IR US GUIDE VASC ACCESS RIGHT  05/30/2023   POLYPECTOMY  04/03/2022   Procedure: POLYPECTOMY;  Surgeon: Dolores Frame, MD;  [facility];  Service: Gastroenterology;;   POSTERIOR CERVICAL FUSION/FORAMINOTOMY N/A 05/26/2017   Procedure: RIGHT C4-5 FORAMINOTOMY WITH EXCISION OF HERNIATED DISC;  Surgeon: Kerrin Champagne, MD;  [facility];  Service: Orthopedics;  Laterality: N/A;   RADIOACTIVE SEED IMPLANT N/A 01/11/2016   Procedure: RADIOACTIVE  SEED IMPLANT/BRACHYTHERAPY IMPLANT;  Surgeon: Marcine Matar, MD;  Location: [facility];  Service: Urology;  Laterality: N/A;   73  seeds implanted no seeds founds in bladder   SCLEROTHERAPY  04/24/2023   Procedure: SCLEROTHERAPY;  Surgeon: Marguerita Merles, Reuel Boom, MD;  [facility];  Service: Gastroenterology;;   SUBMUCOSAL TATTOO INJECTION  04/24/2023   Procedure: SUBMUCOSAL TATTOO INJECTION;  Surgeon: Marguerita Merles, Reuel Boom, MD;  [facility];  Service: Gastroenterology;;    Family History  Problem Relation Age of Onset   Heart attack Mother    Coronary artery disease Father    Ulcerative colitis Father    Ulcers Father    Breast cancer Sister    COPD Brother    Pancreatic cancer Brother    Healthy Sister    Diabetes Brother    Cirrhosis Son    Seizures Son     Social History   Socioeconomic History   Marital status: Married    Spouse name: Not on file   Number of  children: 2   Years of education: Not on file   Highest education level: Not on file  Occupational History   Occupation: Retired Personnel officer  Tobacco Use   Smoking status: Former    Current packs/day: 0.00    Average packs/day: 1 pack/day for 30.0 years (30.0 ttl pk-yrs)    Types: Cigarettes    Start date: 05/22/1959    Quit date: 05/21/1989    Years since quitting: 34.2    Passive exposure: Never   Smokeless tobacco: Never  Vaping Use   Vaping status: Never Used  Substance and Sexual Activity   Alcohol use: Yes    Comment: 1 drink per day   Drug use: No   Sexual activity: Not on file  Other Topics Concern   Not on file  Social History Narrative   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   Social Determinants of Health   Financial Resource Strain: Low Risk  (02/18/2023)   Overall Financial Resource Strain (CARDIA)    Difficulty of Paying Living Expenses: Not very hard  Food Insecurity: No Food Insecurity (04/28/2023)   Hunger Vital Sign    Worried About  Running Out of Food in the Last Year: Never true    Ran Out of Food in the Last Year: Never true  Transportation Needs: No Transportation Needs (04/28/2023)   PRAPARE - Administrator, Civil Service (Medical): No    Lack of Transportation (Non-Medical): No  Physical Activity: Sufficiently Active (09/14/2021)   Exercise Vital Sign    Days of Exercise per Week: 7 days    Minutes of Exercise per Session: 60 min  Stress: No Stress Concern Present (09/14/2021)   [employer] of Occupational Health - Occupational Stress Questionnaire    Feeling of Stress : Not at all  Social Connections: Unknown (04/30/2022)   [facility] from [facility]   Social Network    Social Network: Not on file  Intimate Partner Violence: Not At Risk (04/19/2023)   Humiliation, Afraid, Rape, and Kick questionnaire    Fear of Current or Ex-Partner: No    Emotionally Abused: No    Physically Abused: No    Sexually Abused: No    Outpatient Medications Prior to Visit  Medication Sig Dispense Refill   allopurinol (ZYLOPRIM) 100 MG tablet TAKE 1/2 TABLET EVERY OTHER DAY 23 tablet 0   amLODipine (NORVASC) 10 MG tablet Take 1 tablet (10 mg total) by mouth daily. 90 tablet 3   calcitRIOL (ROCALTROL) 0.25 MCG capsule Take 0.25 mcg by mouth. M, W, F     Colchicine 0.6 MG CAPS Take 0.6 mg by mouth daily as needed (Gout).     Dupilumab (DUPIXENT) 300 MG/2ML SOPN Inject 300 mg into the skin every 14 (fourteen) days. Starting at day 15 for maintenance. 12 mL 1   folic acid (FOLVITE) 400 MCG tablet Take 400 mcg by mouth daily.     leflunomide (ARAVA) 10 MG tablet Take 1 tablet (10 mg total) by mouth daily. 90 tablet 0   nitroGLYCERIN (NITROSTAT) 0.4 MG SL tablet Place 0.4 mg under the tongue every 5 (five) minutes as needed for chest pain.      pantoprazole (PROTONIX) 40 MG tablet Take 1 tablet (40 mg total) by mouth 2 (two) times daily. 180 tablet 1   potassium chloride SA (KLOR-CON M) 20 MEQ tablet  Take 20 mEq by mouth daily. (Patient not taking: Reported on 08/07/2023)     rosuvastatin (CRESTOR) 40 MG tablet Take  1 tablet (40 mg total) by mouth daily. 90 tablet 3   sucralfate (CARAFATE) 1 GM/10ML suspension Take 10 mLs (1 g total) by mouth 4 (four) times daily -  with meals and at bedtime. (Patient not taking: Reported on 08/07/2023) 420 mL 0   triamcinolone cream (KENALOG) 0.1 % Apply 1 Application topically daily. (Patient taking differently: Apply 1 Application topically as needed.)     furosemide (LASIX) 20 MG tablet Take 20 mg by mouth 2 (two) times daily.     hydrALAZINE (APRESOLINE) 50 MG tablet Take 1 tablet (50 mg total) by mouth 2 (two) times daily. 180 tablet 3   Facility-Administered Medications Prior to Visit  Medication Dose Route Frequency Provider Last Rate Last Admin   degarelix (FIRMAGON) injection 240 mg  240 mg Subcutaneous Once Marcine Matar, MD        No Known Allergies  ROS Review of Systems  Constitutional:  Negative for chills and fever.  HENT:  Negative for congestion and sore throat.   Eyes:  Negative for pain and discharge.  Respiratory:  Negative for cough and shortness of breath.   Cardiovascular:  Negative for chest pain and palpitations.  Gastrointestinal:  Positive for constipation. Negative for diarrhea, nausea and vomiting.  Endocrine: Negative for polydipsia and polyuria.  Genitourinary:  Negative for dysuria and hematuria.  Musculoskeletal:  Positive for arthralgias and back pain. Negative for neck pain and neck stiffness.  Skin:  Positive for rash.  Neurological:  Negative for dizziness, weakness, numbness and headaches.  Psychiatric/Behavioral:  Negative for agitation and behavioral problems.       Objective:    Physical Exam Vitals reviewed.  Constitutional:      General: He is not in acute distress.    Appearance: He is not diaphoretic.  HENT:     Head: Normocephalic and atraumatic.     Nose: Nose normal.     Mouth/Throat:      Mouth: Mucous membranes are moist.  Eyes:     General: No scleral icterus.    Extraocular Movements: Extraocular movements intact.  Cardiovascular:     Rate and Rhythm: Normal rate and regular rhythm.     Heart sounds: Normal heart sounds. No murmur heard. Pulmonary:     Breath sounds: Normal breath sounds. No wheezing or rales.  Abdominal:     Palpations: Abdomen is soft.     Tenderness: There is no abdominal tenderness.  Musculoskeletal:     Cervical back: Neck supple. No tenderness.     Right lower leg: No edema.     Left lower leg: No edema.  Feet:     Right foot:     Toenail Condition: Right toenails are abnormally thick. Fungal disease present.    Left foot:     Toenail Condition: Left toenails are abnormally thick. Fungal disease present. Skin:    General: Skin is warm.     Findings: Rash (Diffuse eczematous and dry, scaly patches over LE) present.     Comments: Has soft tissue mass over left side of neck, about 2 cm in diameter, nontender Has HD catheter in place over right chest wall-C/D/I  Neurological:     General: No focal deficit present.     Mental Status: He is alert and oriented to person, place, and time.     Sensory: No sensory deficit.     Motor: No weakness.  Psychiatric:        Mood and Affect: Mood normal.  Behavior: Behavior normal.     BP 110/65 (BP Location: Left Arm, Patient Position: Sitting, Cuff Size: Small)   Pulse 73   Ht 5\' 10"  (1.778 m)   Wt 157 lb 12.8 oz (71.6 kg)   SpO2 98%   BMI 22.64 kg/m  Wt Readings from Last 3 Encounters:  08/07/23 157 lb 12.8 oz (71.6 kg)  07/03/23 158 lb 6.4 oz (71.8 kg)  06/17/23 155 lb 9.6 oz (70.6 kg)    Lab Results  Component Value Date   TSH 3.636 02/13/2023   Lab Results  Component Value Date   WBC 7.3 05/01/2023   HGB 10.0 (L) 05/01/2023   HCT 31.7 (L) 05/01/2023   MCV 90 05/01/2023   PLT 201 05/01/2023   Lab Results  Component Value Date   NA 139 05/01/2023   K 4.0 05/01/2023    CO2 17 (L) 05/01/2023   GLUCOSE 122 (H) 05/01/2023   BUN 51 (H) 05/01/2023   CREATININE 5.73 (H) 05/01/2023   BILITOT 0.5 04/19/2023   ALKPHOS 95 04/19/2023   AST 28 04/19/2023   ALT 34 04/19/2023   PROT 6.2 (L) 04/19/2023   ALBUMIN 2.4 (L) 04/25/2023   CALCIUM 8.4 (L) 05/01/2023   ANIONGAP 12 04/25/2023   EGFR 7.0 05/21/2023   Lab Results  Component Value Date   CHOL 111 01/31/2023   Lab Results  Component Value Date   HDL 53 01/31/2023   Lab Results  Component Value Date   LDLCALC 42 01/31/2023   Lab Results  Component Value Date   TRIG 76 01/31/2023   Lab Results  Component Value Date   CHOLHDL 2.1 01/31/2023   Lab Results  Component Value Date   HGBA1C 5.3 08/07/2023      Assessment & Plan:   Problem List Items Addressed This Visit       Cardiovascular and Mediastinum   Essential hypertension    BP Readings from Last 1 Encounters:  08/07/23 110/65   Was well-controlled with Amlodipine 10 mg QD and hydralazine 50 mg BID Has hypotension on HD days Decreased dose of hydralazine to 25 mg twice daily and avoid  morning dose of hydralazine on HD days Counseled for compliance with the medications Advised DASH diet and moderate exercise/walking as tolerated      Relevant Medications   hydrALAZINE (APRESOLINE) 25 MG tablet   Paroxysmal atrial fibrillation (HCC)    In sinus rhythm currently, rate controlled Was on Eliquis, had to stop due to recurrent GI bleeding Not on rate-controlling agent due to bradycardia      Relevant Medications   hydrALAZINE (APRESOLINE) 25 MG tablet     Endocrine   Type 2 diabetes mellitus with diabetic chronic kidney disease (HCC) - Primary    HbA1c: 5.3 (08/07/23) Diet controlled Advised to follow diabetic diet On statin F/u CMP and lipid panel Diabetic eye exam: Advised to follow up with Ophthalmology for diabetic eye exam      Relevant Orders   Bayer DCA Hb A1c Waived     Genitourinary   ESRD on dialysis  (HCC)    Gets HD on MWF through HD catheter Planned to see vascular surgery for AV fistula      Prostate cancer [facility])    S/p radiotherapy Had rising PSA Had urology evaluation-had PET scan, has radiotracer activity in iliac and pelvic lymph nodes Planned to get leuprolide Referred to heme-onc      Relevant Orders   Ambulatory referral to Hematology / Oncology  Meds ordered this encounter  Medications   hydrALAZINE (APRESOLINE) 25 MG tablet    Sig: Take 1 tablet (25 mg total) by mouth 2 (two) times daily.    Dispense:  180 tablet    Refill:  3    Follow-up: Return in about 4 months (around 12/07/2023) for HTN and ESRD.    Anabel Halon, MD

## 2023-08-07 NOTE — Assessment & Plan Note (Signed)
Gets HD on MWF through HD catheter Planned to see vascular surgery for AV fistula

## 2023-08-07 NOTE — Assessment & Plan Note (Addendum)
HbA1c: 5.3 (08/07/23) Diet controlled Advised to follow diabetic diet On statin F/u CMP and lipid panel Diabetic eye exam: Advised to follow up with Ophthalmology for diabetic eye exam

## 2023-08-07 NOTE — Assessment & Plan Note (Addendum)
BP Readings from Last 1 Encounters:  08/07/23 110/65   Was well-controlled with Amlodipine 10 mg QD and hydralazine 50 mg BID Has hypotension on HD days Decreased dose of hydralazine to 25 mg twice daily and avoid  morning dose of hydralazine on HD days Counseled for compliance with the medications Advised DASH diet and moderate exercise/walking as tolerated

## 2023-08-08 DIAGNOSIS — N2581 Secondary hyperparathyroidism of renal origin: Secondary | ICD-10-CM | POA: Diagnosis not present

## 2023-08-08 DIAGNOSIS — N186 End stage renal disease: Secondary | ICD-10-CM | POA: Diagnosis not present

## 2023-08-08 DIAGNOSIS — Z992 Dependence on renal dialysis: Secondary | ICD-10-CM | POA: Diagnosis not present

## 2023-08-11 ENCOUNTER — Encounter (INDEPENDENT_AMBULATORY_CARE_PROVIDER_SITE_OTHER): Payer: Self-pay | Admitting: Gastroenterology

## 2023-08-11 ENCOUNTER — Ambulatory Visit (INDEPENDENT_AMBULATORY_CARE_PROVIDER_SITE_OTHER): Payer: Medicare HMO | Admitting: Gastroenterology

## 2023-08-11 ENCOUNTER — Other Ambulatory Visit (INDEPENDENT_AMBULATORY_CARE_PROVIDER_SITE_OTHER): Payer: Self-pay

## 2023-08-11 ENCOUNTER — Telehealth (INDEPENDENT_AMBULATORY_CARE_PROVIDER_SITE_OTHER): Payer: Self-pay | Admitting: Gastroenterology

## 2023-08-11 VITALS — BP 138/85 | HR 70 | Temp 98.2°F | Ht 70.0 in | Wt 159.1 lb

## 2023-08-11 DIAGNOSIS — N186 End stage renal disease: Secondary | ICD-10-CM | POA: Diagnosis not present

## 2023-08-11 DIAGNOSIS — N2581 Secondary hyperparathyroidism of renal origin: Secondary | ICD-10-CM | POA: Diagnosis not present

## 2023-08-11 DIAGNOSIS — D509 Iron deficiency anemia, unspecified: Secondary | ICD-10-CM

## 2023-08-11 DIAGNOSIS — L98499 Non-pressure chronic ulcer of skin of other sites with unspecified severity: Secondary | ICD-10-CM | POA: Insufficient documentation

## 2023-08-11 DIAGNOSIS — Z992 Dependence on renal dialysis: Secondary | ICD-10-CM | POA: Diagnosis not present

## 2023-08-11 DIAGNOSIS — K5520 Angiodysplasia of colon without hemorrhage: Secondary | ICD-10-CM | POA: Diagnosis not present

## 2023-08-11 DIAGNOSIS — K289 Gastrojejunal ulcer, unspecified as acute or chronic, without hemorrhage or perforation: Secondary | ICD-10-CM

## 2023-08-11 DIAGNOSIS — D631 Anemia in chronic kidney disease: Secondary | ICD-10-CM

## 2023-08-11 NOTE — Patient Instructions (Signed)
Continue pantoprazole 40 mg every 12 hours May consider decreasing dosage to once a day in next appointment Will request labs from Dr. Lucio Edward office

## 2023-08-11 NOTE — Telephone Encounter (Signed)
I received the results of the most recent blood workup performed on 05/29/2023 which showed a hemoglobin of 9.3, MCV of 90, WBC of 5.3, platelets of 151.  Ferritin was elevated at 391, albumin was 3.7, normal electrolytes.  Negative hepatitis B surface antigen.  Will need to repeat a CBC.  Crystal, can you please call the patient and tell the patient the hemoglobin in June was lower than the previous value?  We will need to repeat a CBC -please send him an order for this.  Thanks,  [name], MD Gastroenterology and Hepatology [facility] Gastroenterology

## 2023-08-11 NOTE — Telephone Encounter (Signed)
Orders placed in Epic for [employer].

## 2023-08-11 NOTE — Progress Notes (Signed)
Jacob Farmer, M.D. Gastroenterology & Hepatology [facility] Gastroenterology [address] [place], [place] [address]  Primary Care Physician: Anabel Halon, MD [address] [place] [address]  I will communicate my assessment and recommendations to the referring MD via EMR.  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  The patient was last seen on 06/02/2023. At that time, the patient was continued on pantoprazole 40 mg twice a day.  We did not receive the blood workup from Dr. Wolfgang Phoenix.  Most recent available blood testing from 05/01/2023 showed hemoglobin of 10.0 with MCV of 90, WBC 7.3 and platelets 201.  Currently taking pantoprazole 40 mg twice a day.  Patient reports feeling well and denies any complaints. The patient denies having any nausea, vomiting, fever, chills, hematochezia, melena, hematemesis, abdominal distention, abdominal pain, diarrhea, jaundice, pruritus or weight loss.  States he talked to his dietitian recently and he was told his "numbers of blood workup were looking very good but only his phosphorus was high".  Last EGD: 04/2023 normal esophagus with healed esophageal ulcer, presence of gastritis and erythematous duodenopathy. Biopsies from the stomach showed reactive gastropathy negative  for H. pylori.   Capsule endoscopy next day which showed a few small AVMs in the small bowel with wisps of bright red blood but no active bleeding.   Enteroscopy on 04/24/2023 showed 1 oozing ulcer in the jejunum with very slow oozing. This was clipped x 2., There was another ulcer in the proximal jejunum measuring 10 mm in size, clipped for marking purposes. A single AVM without bleeding was found in the proximal jejunum which was ablated.   Last Colonoscopy: 03/2022  Two colonic angiodysplastic lesions. Treated with argon plasma coagulation (APC).  - Three 3 to 6 mm polyps in the ascending colon and in the cecum, removed with a cold snare. Resected and retrieved. - The distal rectum and anal verge are normal on retroflexion view No repeat colonoscopy given patient's age  Past Medical History: Past Medical History:  Diagnosis Date   CAD (coronary artery disease)    Acute myocardial infarction treated with TPA in 1990; 1999-stents to circumflex and RCA; residual total occlusion of the left anterior descending; normal ejection fraction; stress nuclear in 2001-distal anteroseptal ischemia; normal LV function   CKD (chronic kidney disease) stage 4, GFR 15-29 ml/min (HCC)    First degree heart block    History of gastric ulcer    History of kidney stones    History of MI (myocardial infarction)    1990-  treated w/ TPA   Hyperlipidemia    Hypertension    Jaundice    OA (osteoarthritis)    Prediabetes    Prostate cancer (HCC)    Stage T1c , Gleason 3+4,  PSA 4.7,  vol 24cc--  scheduled for radiative seed implants   Psoriatic arthritis (HCC)    RA (rheumatoid arthritis) (HCC)    Dr.  Devewshar   Sinus bradycardia    Wears dentures     Past Surgical History: Past Surgical History:  Procedure Laterality Date   BIOPSY  08/27/2019   Procedure: BIOPSY;  Surgeon: Malissa Hippo, MD;  [facility];  Service: Endoscopy;;  gastric   BIOPSY  04/03/2022   Procedure: BIOPSY;   Surgeon: Dolores Frame, MD;  [facility];  Service: Gastroenterology;;   BIOPSY  04/21/2023   Procedure: BIOPSY;  Surgeon: Lanelle Bal, DO;  [facility];  Service: Endoscopy;;   callus removal Left 09/13/2022   left foot   CARDIOVASCULAR STRESS TEST  2001  per Dr Dietrich Pates clinic note   distal anteroseptal ischemia,  normal LVF   COLONOSCOPY  last one 2006 (approx)   COLONOSCOPY WITH PROPOFOL N/A 04/03/2022   Procedure: COLONOSCOPY WITH PROPOFOL;  Surgeon: Dolores Frame, MD;  [facility];  Service: Gastroenterology;  Laterality: N/A;  945   CORONARY ANGIOPLASTY WITH STENT PLACEMENT  1999   DES x2  to CFX and RCA/  residual total occlusion LAD,  normal LVEF   ENTEROSCOPY N/A 04/24/2023   Procedure: ENTEROSCOPY;  Surgeon: Dolores Frame, MD;  [facility];  Service: Gastroenterology;  Laterality: N/A;   ESOPHAGOGASTRODUODENOSCOPY (EGD) WITH PROPOFOL N/A 08/27/2019   Procedure: ESOPHAGOGASTRODUODENOSCOPY (EGD) WITH PROPOFOL;  Surgeon: Malissa Hippo, MD;  [facility];  Service: Endoscopy;  Laterality: N/A;  10:10   ESOPHAGOGASTRODUODENOSCOPY (EGD) WITH PROPOFOL N/A 04/03/2022   Procedure: ESOPHAGOGASTRODUODENOSCOPY (EGD) WITH PROPOFOL;  Surgeon: Dolores Frame, MD;  [facility];  Service: Gastroenterology;  Laterality: N/A;   ESOPHAGOGASTRODUODENOSCOPY (EGD) WITH PROPOFOL N/A 02/16/2023   Procedure: ESOPHAGOGASTRODUODENOSCOPY (EGD) WITH PROPOFOL;  Surgeon: Dolores Frame, MD;  [facility];  Service: Gastroenterology;  Laterality: N/A;   ESOPHAGOGASTRODUODENOSCOPY (EGD) WITH PROPOFOL N/A 04/21/2023   Procedure: ESOPHAGOGASTRODUODENOSCOPY (EGD) WITH PROPOFOL;  Surgeon: Lanelle Bal, DO;  [facility];  Service: Endoscopy;  Laterality: N/A;   EYE SURGERY Bilateral    cataract   GIVENS CAPSULE STUDY N/A 04/22/2023   Procedure: GIVENS CAPSULE STUDY;   Surgeon: Dolores Frame, MD;  [facility];  Service: Gastroenterology;  Laterality: N/A;   HEMOSTASIS CLIP PLACEMENT  04/24/2023   Procedure: HEMOSTASIS CLIP PLACEMENT;  Surgeon: Dolores Frame, MD;  [facility];  Service: Gastroenterology;;   HOT HEMOSTASIS  04/03/2022   Procedure: HOT HEMOSTASIS (ARGON PLASMA COAGULATION/BICAP);  Surgeon: Marguerita Merles, Reuel Boom, MD;  [facility];  Service: Gastroenterology;;   HOT HEMOSTASIS  04/24/2023   Procedure: HOT HEMOSTASIS (ARGON PLASMA COAGULATION/BICAP);  Surgeon: Marguerita Merles, Reuel Boom, MD;  [facility];  Service: Gastroenterology;;   IR FLUORO GUIDE CV LINE RIGHT  05/30/2023   IR US GUIDE VASC ACCESS RIGHT  05/30/2023   POLYPECTOMY  04/03/2022   Procedure: POLYPECTOMY;  Surgeon: Dolores Frame, MD;  [facility];  Service: Gastroenterology;;   POSTERIOR CERVICAL FUSION/FORAMINOTOMY N/A 05/26/2017   Procedure: RIGHT C4-5 FORAMINOTOMY WITH EXCISION OF HERNIATED DISC;  Surgeon: Kerrin Champagne, MD;  Location: [facility];  Service: Orthopedics;  Laterality: N/A;   RADIOACTIVE SEED IMPLANT N/A 01/11/2016   Procedure: RADIOACTIVE SEED IMPLANT/BRACHYTHERAPY IMPLANT;  Surgeon: Marcine Matar, MD;  Location: [facility];  Service: Urology;  Laterality: N/A;   73  seeds implanted no seeds founds in bladder   SCLEROTHERAPY  04/24/2023   Procedure: SCLEROTHERAPY;  Surgeon: Marguerita Merles, Reuel Boom, MD;  [facility];  Service: Gastroenterology;;   SUBMUCOSAL TATTOO INJECTION  04/24/2023   Procedure: SUBMUCOSAL TATTOO INJECTION;  Surgeon: Marguerita Merles, Reuel Boom, MD;  [facility];  Service: Gastroenterology;;    Family History: Family History  Problem Relation Age of Onset   Heart attack Mother    Coronary artery disease Father    Ulcerative colitis Father    Ulcers Father    Breast cancer Sister    COPD Brother    Pancreatic  cancer Brother    Healthy Sister    Diabetes Brother    Cirrhosis Son    Seizures Son     Social History: Social History   Tobacco Use  Smoking Status Former   Current packs/day: 0.00   Average packs/day: 1 pack/day for 30.0 years (30.0 ttl pk-yrs)   Types: Cigarettes   Start date: 05/22/1959   Quit date: 05/21/1989   Years since quitting: 34.2   Passive exposure: Never  Smokeless Tobacco Never   Social History   Substance and Sexual Activity  Alcohol Use Yes   Comment: 1 drink per day   Social History   Substance and Sexual Activity  Drug Use No    Allergies: No Known Allergies  Medications: Current Outpatient Medications  Medication Sig Dispense Refill   allopurinol (ZYLOPRIM) 100 MG tablet TAKE 1/2 TABLET EVERY OTHER DAY 23 tablet 0   amLODipine (NORVASC) 10 MG tablet Take 1 tablet (10 mg total) by mouth daily. 90 tablet 3   calcitRIOL (ROCALTROL) 0.25 MCG capsule Take 0.25 mcg by mouth. M, W, F     Colchicine 0.6 MG CAPS Take 0.6 mg by mouth daily as needed (Gout).     Dupilumab (DUPIXENT) 300 MG/2ML SOPN Inject 300 mg into the skin every 14 (fourteen) days. Starting at day 15 for maintenance. 12 mL 1   folic acid (FOLVITE) 400 MCG tablet Take 400 mcg by mouth daily.     hydrALAZINE (APRESOLINE) 25 MG tablet Take 1 tablet (25 mg total) by mouth 2 (two) times daily. 180 tablet 3   leflunomide (ARAVA) 10 MG tablet Take 1 tablet (10 mg total) by mouth daily. 90 tablet 0   nitroGLYCERIN (NITROSTAT) 0.4 MG SL tablet Place 0.4 mg under the tongue every 5 (five) minutes as needed for chest pain.      pantoprazole (PROTONIX) 40 MG tablet Take 1 tablet (40 mg total) by mouth 2 (two) times daily. 180 tablet 1   rosuvastatin (CRESTOR) 40 MG tablet Take 1 tablet (40 mg total) by mouth daily. 90 tablet 3   triamcinolone cream (KENALOG) 0.1 % Apply 1 Application topically daily. (Patient taking differently: Apply 1 Application topically as needed.)     Current  Facility-Administered Medications  Medication Dose Route Frequency Provider Last Rate Last Admin   degarelix (FIRMAGON) injection 240 mg  240 mg Subcutaneous Once Marcine Matar, MD        Review of Systems: GENERAL: negative for malaise, night sweats HEENT: No changes in hearing or vision, no nose bleeds or other nasal problems. NECK: Negative for lumps, goiter, pain and significant neck swelling RESPIRATORY: Negative for cough, wheezing CARDIOVASCULAR: Negative for chest pain, leg swelling, palpitations, orthopnea GI: SEE HPI MUSCULOSKELETAL: Negative for joint pain or swelling, back pain, and muscle pain. SKIN: Negative for lesions, rash PSYCH: Negative for sleep disturbance, mood disorder and recent psychosocial stressors. HEMATOLOGY Negative for prolonged bleeding, bruising easily, and swollen nodes. ENDOCRINE: Negative for cold  or heat intolerance, polyuria, polydipsia and goiter. NEURO: negative for tremor, gait imbalance, syncope and seizures. The remainder of the review of systems is noncontributory.   Physical Exam: BP 138/85 (BP Location: Right Arm, Patient Position: Sitting, Cuff Size: Normal)   Pulse 70   Temp 98.2 F (36.8 C) (Oral)   Ht 5\' 10"  (1.778 m)   Wt 159 lb 1.6 oz (72.2 kg)   BMI 22.83 kg/m  GENERAL: The patient is AO x3, in no acute distress. HEENT: Head is normocephalic and atraumatic. EOMI are intact. Mouth is well hydrated and without lesions. NECK: Supple. No masses LUNGS: Clear to auscultation. No presence of rhonchi/wheezing/rales. Adequate chest expansion HEART: RRR, normal s1 and s2. ABDOMEN: Soft, nontender, no guarding, no peritoneal signs, and nondistended. BS +. No masses. EXTREMITIES: Without any cyanosis, clubbing, rash, lesions or edema. NEUROLOGIC: AOx3, no focal motor deficit. SKIN: no jaundice, no rashes  Imaging/Labs: as above  I personally reviewed and interpreted the available labs, imaging and endoscopic files.  Impression  and Plan: Jacob Farmer is a 77 y.o. 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  The patient has not presented any more episodes of overt gastrointestinal bleeding while taking PPI twice a day for multiple jejunal ischemic ulcers.  It is unclear if his hemoglobin has stabilized, but he reports most recent labs looked better than before.  I will request the most recent labs from Dr. Eliane Decree office.  For now we will continue with PPI twice daily and may consider decreasing it in his follow-up appointment.  - Continue pantoprazole 40 mg every 12 hours - May consider decreasing dosage to once a day in next appointment - Will request labs from Dr. Lucio Edward office  All questions were answered.      Jacob Blazing, MD Gastroenterology and Hepatology [facility] Gastroenterology

## 2023-08-11 NOTE — Telephone Encounter (Signed)
Mailed lab orders to patient home address

## 2023-08-12 NOTE — Telephone Encounter (Signed)
I spoke with the patient and made aware the hemoglobin in June was lower than the previous value?  We will need to repeat a CBC -please send him an order for this.   Thanks,   [name], MD Gastroenterology and Hepatology [facility] Gastroenterology  Patient aware of all, states understanding and aware I have sent the order to him via mail on 08/11/2023. He is to have drawn at his convenience.

## 2023-08-13 ENCOUNTER — Encounter: Payer: Self-pay | Admitting: Vascular Surgery

## 2023-08-13 ENCOUNTER — Ambulatory Visit: Payer: Medicare HMO | Admitting: Vascular Surgery

## 2023-08-13 VITALS — BP 138/76 | HR 70 | Temp 97.7°F | Resp 18 | Ht 70.0 in | Wt 158.9 lb

## 2023-08-13 DIAGNOSIS — N2581 Secondary hyperparathyroidism of renal origin: Secondary | ICD-10-CM | POA: Diagnosis not present

## 2023-08-13 DIAGNOSIS — N186 End stage renal disease: Secondary | ICD-10-CM

## 2023-08-13 DIAGNOSIS — Z992 Dependence on renal dialysis: Secondary | ICD-10-CM | POA: Diagnosis not present

## 2023-08-13 NOTE — Progress Notes (Signed)
Patient ID: Jacob Farmer, male   DOB: [birth year], 77 y.o.   MRN: [id number]  Reason for Consult: Chronic Kidney Disease (F/U for permanent access)   Referred by Randa Lynn, MD  Subjective:     HPI:  Jacob Farmer is a 77 y.o. male I previously seen for concern of chronic mesenteric ischemia with SMA stenosis.  He is subsequently elected to go on dialysis and is currently dialyzing via catheter.  He is right-hand dominant.  Denies any previous left upper arm, chest or breast surgeries and does not have a pacemaker or defibrillator.  Patient does not take blood thinners.  He is feeling much better since initiating dialysis.  Past Medical History:  Diagnosis Date   CAD (coronary artery disease)    Acute myocardial infarction treated with TPA in 1990; 1999-stents to circumflex and RCA; residual total occlusion of the left anterior descending; normal ejection fraction; stress nuclear in 2001-distal anteroseptal ischemia; normal LV function   CKD (chronic kidney disease) stage 4, GFR 15-29 ml/min (HCC)    First degree heart block    History of gastric ulcer    History of kidney stones    History of MI (myocardial infarction)    1990-  treated w/ TPA   Hyperlipidemia    Hypertension    Jaundice    OA (osteoarthritis)    Prediabetes    Prostate cancer (HCC)    Stage T1c , Gleason 3+4,  PSA 4.7,  vol 24cc--  scheduled for radiative seed implants   Psoriatic arthritis (HCC)    RA (rheumatoid arthritis) (HCC)    Dr. Beatrix Fetters   Sinus bradycardia    Wears dentures    Family History  Problem Relation Age of Onset   Heart attack Mother    Coronary artery disease Father    Ulcerative colitis Father    Ulcers Father    Breast cancer Sister    COPD Brother    Pancreatic cancer Brother    Healthy Sister    Diabetes Brother    Cirrhosis Son    Seizures Son    Past Surgical History:  Procedure Laterality Date   BIOPSY  08/27/2019   Procedure: BIOPSY;  Surgeon: Malissa Hippo, MD;  [facility];  Service: Endoscopy;;  gastric   BIOPSY  04/03/2022   Procedure: BIOPSY;  Surgeon: Dolores Frame, MD;  [facility];  Service: Gastroenterology;;   BIOPSY  04/21/2023   Procedure: BIOPSY;  Surgeon: Lanelle Bal, DO;  [facility];  Service: Endoscopy;;   callus removal Left 09/13/2022   left foot   CARDIOVASCULAR STRESS TEST  2001  per Dr Dietrich Pates clinic note   distal anteroseptal ischemia,  normal LVF   COLONOSCOPY  last one 2006 (approx)   COLONOSCOPY WITH PROPOFOL N/A 04/03/2022   Procedure: COLONOSCOPY WITH PROPOFOL;  Surgeon: Dolores Frame, MD;  [facility];  Service: Gastroenterology;  Laterality: N/A;  945   CORONARY ANGIOPLASTY WITH STENT PLACEMENT  1999   DES x2  to CFX and RCA/  residual total occlusion LAD,  normal LVEF   ENTEROSCOPY N/A 04/24/2023   Procedure: ENTEROSCOPY;  Surgeon: Dolores Frame, MD;  [facility];  Service: Gastroenterology;  Laterality: N/A;   ESOPHAGOGASTRODUODENOSCOPY (EGD) WITH PROPOFOL N/A 08/27/2019   Procedure: ESOPHAGOGASTRODUODENOSCOPY (EGD) WITH PROPOFOL;  Surgeon: Malissa Hippo, MD;  [facility];  Service: Endoscopy;  Laterality: N/A;  10:10  ESOPHAGOGASTRODUODENOSCOPY (EGD) WITH PROPOFOL N/A 04/03/2022   Procedure: ESOPHAGOGASTRODUODENOSCOPY (EGD) WITH PROPOFOL;  Surgeon: Dolores Frame, MD;  [facility];  Service: Gastroenterology;  Laterality: N/A;   ESOPHAGOGASTRODUODENOSCOPY (EGD) WITH PROPOFOL N/A 02/16/2023   Procedure: ESOPHAGOGASTRODUODENOSCOPY (EGD) WITH PROPOFOL;  Surgeon: Dolores Frame, MD;  [facility];  Service: Gastroenterology;  Laterality: N/A;   ESOPHAGOGASTRODUODENOSCOPY (EGD) WITH PROPOFOL N/A 04/21/2023   Procedure: ESOPHAGOGASTRODUODENOSCOPY (EGD) WITH PROPOFOL;  Surgeon: Lanelle Bal, DO;  [facility];  Service: Endoscopy;  Laterality:  N/A;   EYE SURGERY Bilateral    cataract   GIVENS CAPSULE STUDY N/A 04/22/2023   Procedure: GIVENS CAPSULE STUDY;  Surgeon: Dolores Frame, MD;  [facility];  Service: Gastroenterology;  Laterality: N/A;   HEMOSTASIS CLIP PLACEMENT  04/24/2023   Procedure: HEMOSTASIS CLIP PLACEMENT;  Surgeon: Dolores Frame, MD;  [facility];  Service: Gastroenterology;;   HOT HEMOSTASIS  04/03/2022   Procedure: HOT HEMOSTASIS (ARGON PLASMA COAGULATION/BICAP);  Surgeon: Marguerita Merles, Reuel Boom, MD;  [facility];  Service: Gastroenterology;;   HOT HEMOSTASIS  04/24/2023   Procedure: HOT HEMOSTASIS (ARGON PLASMA COAGULATION/BICAP);  Surgeon: Marguerita Merles, Reuel Boom, MD;  [facility];  Service: Gastroenterology;;   IR FLUORO GUIDE CV LINE RIGHT  05/30/2023   IR US GUIDE VASC ACCESS RIGHT  05/30/2023   POLYPECTOMY  04/03/2022   Procedure: POLYPECTOMY;  Surgeon: Dolores Frame, MD;  [facility];  Service: Gastroenterology;;   POSTERIOR CERVICAL FUSION/FORAMINOTOMY N/A 05/26/2017   Procedure: RIGHT C4-5 FORAMINOTOMY WITH EXCISION OF HERNIATED DISC;  Surgeon: Kerrin Champagne, MD;  Location[facility];  Service: Orthopedics;  Laterality: N/A;   RADIOACTIVE SEED IMPLANT N/A 01/11/2016   Procedure: RADIOACTIVE SEED IMPLANT/BRACHYTHERAPY IMPLANT;  Surgeon: Marcine Matar, MD;  Location: [facility];  Service: Urology;  Laterality: N/A;   73  seeds implanted no seeds founds in bladder   SCLEROTHERAPY  04/24/2023   Procedure: SCLEROTHERAPY;  Surgeon: Marguerita Merles, Reuel Boom, MD;  [facility];  Service: Gastroenterology;;   SUBMUCOSAL TATTOO INJECTION  04/24/2023   Procedure: SUBMUCOSAL TATTOO INJECTION;  Surgeon: Marguerita Merles, Reuel Boom, MD;  [facility];  Service: Gastroenterology;;    Short Social History:  Social History   Tobacco Use   Smoking status: Former    Current packs/day: 0.00     Average packs/day: 1 pack/day for 30.0 years (30.0 ttl pk-yrs)    Types: Cigarettes    Start date: 05/22/1959    Quit date: 05/21/1989    Years since quitting: 34.2    Passive exposure: Never   Smokeless tobacco: Never  Substance Use Topics   Alcohol use: Yes    Comment: 1 drink per day    No Known Allergies  Current Outpatient Medications  Medication Sig Dispense Refill   allopurinol (ZYLOPRIM) 100 MG tablet TAKE 1/2 TABLET EVERY OTHER DAY 23 tablet 0   amLODipine (NORVASC) 10 MG tablet Take 1 tablet (10 mg total) by mouth daily. 90 tablet 3   calcitRIOL (ROCALTROL) 0.25 MCG capsule Take 0.25 mcg by mouth. M, W, F     Colchicine 0.6 MG CAPS Take 0.6 mg by mouth daily as needed (Gout). (Patient not taking: Reported on 08/13/2023)     Dupilumab (DUPIXENT) 300 MG/2ML SOPN Inject 300 mg into the skin every 14 (fourteen) days. Starting at day 15 for maintenance. 12 mL 1   folic acid (FOLVITE) 400 MCG tablet  Take 400 mcg by mouth daily.     hydrALAZINE (APRESOLINE) 25 MG tablet Take 1 tablet (25 mg total) by mouth 2 (two) times daily. 180 tablet 3   leflunomide (ARAVA) 10 MG tablet Take 1 tablet (10 mg total) by mouth daily. 90 tablet 0   nitroGLYCERIN (NITROSTAT) 0.4 MG SL tablet Place 0.4 mg under the tongue every 5 (five) minutes as needed for chest pain.      pantoprazole (PROTONIX) 40 MG tablet Take 1 tablet (40 mg total) by mouth 2 (two) times daily. 180 tablet 1   rosuvastatin (CRESTOR) 40 MG tablet Take 1 tablet (40 mg total) by mouth daily. 90 tablet 3   triamcinolone cream (KENALOG) 0.1 % Apply 1 Application topically daily. (Patient taking differently: Apply 1 Application topically as needed.)     Current Facility-Administered Medications  Medication Dose Route Frequency Provider Last Rate Last Admin   degarelix (FIRMAGON) injection 240 mg  240 mg Subcutaneous Once Marcine Matar, MD        Review of Systems  Constitutional:  Constitutional negative. HENT: HENT negative.   Eyes: Eyes negative.  Respiratory: Respiratory negative.  Cardiovascular: Cardiovascular negative.  GI: Gastrointestinal negative.  Musculoskeletal: Musculoskeletal negative.  Skin: Skin negative.  Neurological: Neurological negative. Hematologic: Hematologic/lymphatic negative.  Psychiatric: Psychiatric negative.        Objective:  Objective   Vitals:   08/13/23 0857  BP: 138/76  Pulse: 70  Resp: 18  Temp: 97.7 F (36.5 C)  TempSrc: Temporal  SpO2: 99%  Weight: 158 lb 14.4 oz (72.1 kg)  Height: 5\' 10"  (1.778 m)   Body mass index is 22.8 kg/m.  Physical Exam HENT:     Head: Normocephalic.     Nose: Nose normal.     Mouth/Throat:     Mouth: Mucous membranes are moist.  Eyes:     Pupils: Pupils are equal, round, and reactive to light.  Cardiovascular:     Rate and Rhythm: Normal rate.     Pulses:          Radial pulses are 2+ on the right side and 2+ on the left side.  Pulmonary:     Effort: Pulmonary effort is normal.  Abdominal:     General: Abdomen is flat.     Palpations: Abdomen is soft.  Musculoskeletal:        General: Normal range of motion.     Cervical back: Normal range of motion and neck supple.     Right lower leg: No edema.     Left lower leg: No edema.  Skin:    General: Skin is warm.     Capillary Refill: Capillary refill takes less than 2 seconds.  Neurological:     General: No focal deficit present.     Mental Status: He is alert.  Psychiatric:        Mood and Affect: Mood normal.        Thought Content: Thought content normal.        Judgment: Judgment normal.     Data: Right Pre-Dialysis Findings:  +-----------------------+----------+--------------------+---------+--------  +  Location              PSV (cm/s)Intralum. Diam. (cm)Waveform  Comments  +-----------------------+----------+--------------------+---------+--------  +  Brachial Antecub. fossa58        0.69                triphasic            +-----------------------+----------+--------------------+---------+--------  +  Radial Art at Wrist    75        0.25                triphasic           +-----------------------+----------+--------------------+---------+--------  +  Ulnar Art at Wrist     86        0.19                triphasic           +-----------------------+----------+--------------------+---------+--------  +       Left Pre-Dialysis Findings:  +-----------------------+----------+--------------------+---------+--------  +  Location              PSV (cm/s)Intralum. Diam. (cm)Waveform  Comments  +-----------------------+----------+--------------------+---------+--------  +  Brachial Antecub. fossa62        0.55                triphasic           +-----------------------+----------+--------------------+---------+--------  +  Radial Art at Wrist    74        0.27                triphasic           +-----------------------+----------+--------------------+---------+--------  +  Ulnar Art at Wrist     44        0.23                triphasic           +-----------------------+----------+--------------------+---------+--------  +        Summary:    Right: Patent brachial, radial, and ulnar arteries.  Left: Patent brachial, radial, and ulnar arteries.     Right Cephalic   Diameter (cm)Depth (cm)Findings  +-----------------+-------------+----------+--------+  Shoulder            0.46                         +-----------------+-------------+----------+--------+  Prox upper arm       0.39                         +-----------------+-------------+----------+--------+  Mid upper arm        0.46                         +-----------------+-------------+----------+--------+  Dist upper arm       0.48                         +-----------------+-------------+----------+--------+  Antecubital fossa    0.42                          +-----------------+-------------+----------+--------+  Prox forearm         0.27                         +-----------------+-------------+----------+--------+  Mid forearm          0.27                         +-----------------+-------------+----------+--------+  Dist forearm         0.24                         +-----------------+-------------+----------+--------+   +-----------------+-------------+----------+--------+  Right Basilic    Diameter (cm)Depth (cm)Findings  +-----------------+-------------+----------+--------+  Prox upper arm       0.27                         +-----------------+-------------+----------+--------+  Mid upper arm        0.29                         +-----------------+-------------+----------+--------+  Dist upper arm       0.36                         +-----------------+-------------+----------+--------+  Antecubital fossa    0.29                         +-----------------+-------------+----------+--------+   +-----------------+-------------+----------+--------+  Left Cephalic    Diameter (cm)Depth (cm)Findings  +-----------------+-------------+----------+--------+  Shoulder            0.42                         +-----------------+-------------+----------+--------+  Prox upper arm       0.51                         +-----------------+-------------+----------+--------+  Mid upper arm        0.46                         +-----------------+-------------+----------+--------+  Dist upper arm       0.45                         +-----------------+-------------+----------+--------+  Antecubital fossa    0.39                         +-----------------+-------------+----------+--------+  Prox forearm         0.24                         +-----------------+-------------+----------+--------+  Mid forearm          0.25                          +-----------------+-------------+----------+--------+  Dist forearm         0.25                         +-----------------+-------------+----------+--------+   +-----------------+-------------+----------+--------+  Left Basilic     Diameter (cm)Depth (cm)Findings  +-----------------+-------------+----------+--------+  Prox upper arm       0.33                         +-----------------+-------------+----------+--------+  Mid upper arm        0.30                         +-----------------+-------------+----------+--------+  Dist upper arm       0.32                         +-----------------+-------------+----------+--------+  Antecubital fossa    0.31                         +-----------------+-------------+----------+--------+  Summary: Right: Patent cephalic and basilic veins.  Left: Patent cephalic and basilic veins.      Assessment/Plan:    77 year old male now with end-stage renal disease on dialysis via right IJ catheter.  Plan will be for left arm AV fistula versus graft on a nondialysis day in the near future.  Currently he is dialyzing Mondays, Wednesdays and Fridays.  All risk benefits alternatives discussed with patient and his significant other and they demonstrate good understanding.     Maeola Harman MD [facility]

## 2023-08-15 DIAGNOSIS — Z992 Dependence on renal dialysis: Secondary | ICD-10-CM | POA: Diagnosis not present

## 2023-08-15 DIAGNOSIS — N186 End stage renal disease: Secondary | ICD-10-CM | POA: Diagnosis not present

## 2023-08-15 DIAGNOSIS — N2581 Secondary hyperparathyroidism of renal origin: Secondary | ICD-10-CM | POA: Diagnosis not present

## 2023-08-17 DIAGNOSIS — Z992 Dependence on renal dialysis: Secondary | ICD-10-CM | POA: Diagnosis not present

## 2023-08-17 DIAGNOSIS — N186 End stage renal disease: Secondary | ICD-10-CM | POA: Diagnosis not present

## 2023-08-18 ENCOUNTER — Other Ambulatory Visit: Payer: Self-pay | Admitting: Rheumatology

## 2023-08-18 DIAGNOSIS — N186 End stage renal disease: Secondary | ICD-10-CM | POA: Diagnosis not present

## 2023-08-18 DIAGNOSIS — M0600 Rheumatoid arthritis without rheumatoid factor, unspecified site: Secondary | ICD-10-CM

## 2023-08-18 DIAGNOSIS — Z79899 Other long term (current) drug therapy: Secondary | ICD-10-CM

## 2023-08-18 DIAGNOSIS — N2581 Secondary hyperparathyroidism of renal origin: Secondary | ICD-10-CM | POA: Diagnosis not present

## 2023-08-18 DIAGNOSIS — Z992 Dependence on renal dialysis: Secondary | ICD-10-CM | POA: Diagnosis not present

## 2023-08-19 ENCOUNTER — Telehealth: Payer: Self-pay

## 2023-08-19 ENCOUNTER — Telehealth (INDEPENDENT_AMBULATORY_CARE_PROVIDER_SITE_OTHER): Payer: Self-pay | Admitting: Gastroenterology

## 2023-08-19 DIAGNOSIS — D631 Anemia in chronic kidney disease: Secondary | ICD-10-CM | POA: Diagnosis not present

## 2023-08-19 DIAGNOSIS — N184 Chronic kidney disease, stage 4 (severe): Secondary | ICD-10-CM | POA: Diagnosis not present

## 2023-08-19 DIAGNOSIS — D509 Iron deficiency anemia, unspecified: Secondary | ICD-10-CM | POA: Diagnosis not present

## 2023-08-19 NOTE — Telephone Encounter (Signed)
Attempted to reach patient to schedule surgery. Left VM for patient to return call.  

## 2023-08-19 NOTE — Addendum Note (Signed)
Addended by: Henriette Combs on: 08/19/2023 01:54 PM   Modules accepted: Orders

## 2023-08-19 NOTE — Telephone Encounter (Signed)
Hi Crystal,  Can you please call the patient and tell the patient the most recent hemoglobin was better (11.2)?  He should continue taking the pantoprazole twice a day.  Thanks,  [name], MD Gastroenterology and Hepatology [facility] Gastroenterology

## 2023-08-19 NOTE — Telephone Encounter (Signed)
Please clarify if he has had updated lab work?  Due for lab work this week.

## 2023-08-19 NOTE — Telephone Encounter (Signed)
Last Fill: 05/13/2023  Labs: 05/21/2023 Glucose 130, BUN 49, Creat. 6.09, GFR 7, BUN/Creat. Ratio 8, CO2 19, Calcium 8.5, Phosphorus 4.9, Albumin 3.7, RBC 3.10, Hgb 8.8, Hct 28.6, MCHC 30.8, RDW 15.6, Platelets 147  Next Visit: 11/18/2023  Last Visit: 06/17/2023  DX: Seronegative rheumatoid arthritis   Current Dose per office note 06/17/2023: Arava 10 mg 1 tablet by mouth daily   Okay to refill Arava ?

## 2023-08-19 NOTE — Telephone Encounter (Signed)
Patient has not had any recent lab work. Patient will go to [facility] this week. Orders released.

## 2023-08-20 ENCOUNTER — Other Ambulatory Visit: Payer: Self-pay

## 2023-08-20 DIAGNOSIS — N2581 Secondary hyperparathyroidism of renal origin: Secondary | ICD-10-CM | POA: Diagnosis not present

## 2023-08-20 DIAGNOSIS — N186 End stage renal disease: Secondary | ICD-10-CM

## 2023-08-20 DIAGNOSIS — Z992 Dependence on renal dialysis: Secondary | ICD-10-CM | POA: Diagnosis not present

## 2023-08-20 LAB — CBC WITH DIFFERENTIAL/PLATELET
Basophils Absolute: 0 10*3/uL (ref 0.0–0.2)
Basos: 1 %
EOS (ABSOLUTE): 0.2 10*3/uL (ref 0.0–0.4)
Eos: 4 %
Hematocrit: 35 % — ABNORMAL LOW (ref 37.5–51.0)
Hemoglobin: 11.4 g/dL — ABNORMAL LOW (ref 13.0–17.7)
Immature Grans (Abs): 0 10*3/uL (ref 0.0–0.1)
Immature Granulocytes: 0 %
Lymphocytes Absolute: 1.2 10*3/uL (ref 0.7–3.1)
Lymphs: 20 %
MCH: 28.9 pg (ref 26.6–33.0)
MCHC: 32.6 g/dL (ref 31.5–35.7)
MCV: 89 fL (ref 79–97)
Monocytes Absolute: 0.6 10*3/uL (ref 0.1–0.9)
Monocytes: 10 %
Neutrophils Absolute: 4 10*3/uL (ref 1.4–7.0)
Neutrophils: 65 %
Platelets: 179 10*3/uL (ref 150–450)
RBC: 3.94 x10E6/uL — ABNORMAL LOW (ref 4.14–5.80)
RDW: 13 % (ref 11.6–15.4)
WBC: 6.2 10*3/uL (ref 3.4–10.8)

## 2023-08-20 NOTE — Telephone Encounter (Signed)
I called and left a message asked that the patient please return call to the office. 

## 2023-08-20 NOTE — Telephone Encounter (Signed)
Patient/wife returned call. Surgery scheduled for 9/17. Instructions provided and patient/wife verbalized understanding.

## 2023-08-20 NOTE — Telephone Encounter (Signed)
Patient made aware   the most recent hemoglobin was better (11.2)?  He should continue taking the pantoprazole twice a day.   Thanks,   [name], MD Gastroenterology and Hepatology [facility] Gastroenterology

## 2023-08-21 DIAGNOSIS — Z79899 Other long term (current) drug therapy: Secondary | ICD-10-CM | POA: Diagnosis not present

## 2023-08-22 DIAGNOSIS — Z992 Dependence on renal dialysis: Secondary | ICD-10-CM | POA: Diagnosis not present

## 2023-08-22 DIAGNOSIS — N186 End stage renal disease: Secondary | ICD-10-CM | POA: Diagnosis not present

## 2023-08-22 DIAGNOSIS — N2581 Secondary hyperparathyroidism of renal origin: Secondary | ICD-10-CM | POA: Diagnosis not present

## 2023-08-22 LAB — CMP14+EGFR
ALT: 16 IU/L (ref 0–44)
AST: 20 IU/L (ref 0–40)
Albumin: 4.2 g/dL (ref 3.8–4.8)
Alkaline Phosphatase: 104 IU/L (ref 44–121)
BUN/Creatinine Ratio: 6 — ABNORMAL LOW (ref 10–24)
BUN: 20 mg/dL (ref 8–27)
Bilirubin Total: 0.3 mg/dL (ref 0.0–1.2)
CO2: 28 mmol/L (ref 20–29)
Calcium: 9.1 mg/dL (ref 8.6–10.2)
Chloride: 93 mmol/L — ABNORMAL LOW (ref 96–106)
Creatinine, Ser: 3.59 mg/dL — ABNORMAL HIGH (ref 0.76–1.27)
Globulin, Total: 3 g/dL (ref 1.5–4.5)
Glucose: 102 mg/dL — ABNORMAL HIGH (ref 70–99)
Potassium: 3.5 mmol/L (ref 3.5–5.2)
Sodium: 138 mmol/L (ref 134–144)
Total Protein: 7.2 g/dL (ref 6.0–8.5)
eGFR: 17 mL/min/{1.73_m2} — ABNORMAL LOW (ref >59)

## 2023-08-22 LAB — CBC WITH DIFFERENTIAL/PLATELET
Basophils Absolute: 0 10*3/uL (ref 0.0–0.2)
Basos: 0 %
EOS (ABSOLUTE): 0.3 10*3/uL (ref 0.0–0.4)
Eos: 4 %
Hematocrit: 38.8 % (ref 37.5–51.0)
Hemoglobin: 12.1 g/dL — ABNORMAL LOW (ref 13.0–17.7)
Immature Grans (Abs): 0 10*3/uL (ref 0.0–0.1)
Immature Granulocytes: 0 %
Lymphocytes Absolute: 1.4 10*3/uL (ref 0.7–3.1)
Lymphs: 21 %
MCH: 28.5 pg (ref 26.6–33.0)
MCHC: 31.2 g/dL — ABNORMAL LOW (ref 31.5–35.7)
MCV: 91 fL (ref 79–97)
Monocytes Absolute: 0.6 10*3/uL (ref 0.1–0.9)
Monocytes: 9 %
Neutrophils Absolute: 4.4 10*3/uL (ref 1.4–7.0)
Neutrophils: 66 %
Platelets: 198 10*3/uL (ref 150–450)
RBC: 4.25 x10E6/uL (ref 4.14–5.80)
RDW: 13.1 % (ref 11.6–15.4)
WBC: 6.7 10*3/uL (ref 3.4–10.8)

## 2023-08-24 NOTE — Progress Notes (Signed)
Creatinine is elevated and stable.  Hemoglobin is low and stable.  Please forward results to his PCP and nephrologist.

## 2023-08-25 ENCOUNTER — Telehealth: Payer: Self-pay | Admitting: Internal Medicine

## 2023-08-25 DIAGNOSIS — N186 End stage renal disease: Secondary | ICD-10-CM | POA: Diagnosis not present

## 2023-08-25 DIAGNOSIS — Z992 Dependence on renal dialysis: Secondary | ICD-10-CM | POA: Diagnosis not present

## 2023-08-25 DIAGNOSIS — N2581 Secondary hyperparathyroidism of renal origin: Secondary | ICD-10-CM | POA: Diagnosis not present

## 2023-08-25 NOTE — Telephone Encounter (Signed)
Pt called in for A1C results. Wants a call back.

## 2023-08-25 NOTE — Telephone Encounter (Signed)
Spoke to patient

## 2023-08-26 ENCOUNTER — Ambulatory Visit (INDEPENDENT_AMBULATORY_CARE_PROVIDER_SITE_OTHER): Payer: Medicare HMO

## 2023-08-26 DIAGNOSIS — C61 Malignant neoplasm of prostate: Secondary | ICD-10-CM | POA: Diagnosis not present

## 2023-08-26 MED ORDER — LEUPROLIDE ACETATE (4 MONTH) 30 MG ~~LOC~~ KIT
30.0000 mg | PACK | Freq: Once | SUBCUTANEOUS | Status: AC
Start: 2023-08-26 — End: 2023-08-26
  Administered 2023-08-26: 30 mg via SUBCUTANEOUS

## 2023-08-26 NOTE — Progress Notes (Signed)
Eligard SubQ Injection   Due to Prostate Cancer patient is present today for a Eligard Injection.  Medication: Eligard 4 month Dose: 30 mg  Location: left  Lot: [id number] Exp: 06/2024  Patient tolerated well, no complications were noted  Performed by: Guss Bunde, CMA

## 2023-08-27 DIAGNOSIS — N2581 Secondary hyperparathyroidism of renal origin: Secondary | ICD-10-CM | POA: Diagnosis not present

## 2023-08-27 DIAGNOSIS — Z992 Dependence on renal dialysis: Secondary | ICD-10-CM | POA: Diagnosis not present

## 2023-08-27 DIAGNOSIS — N186 End stage renal disease: Secondary | ICD-10-CM | POA: Diagnosis not present

## 2023-08-29 DIAGNOSIS — Z992 Dependence on renal dialysis: Secondary | ICD-10-CM | POA: Diagnosis not present

## 2023-08-29 DIAGNOSIS — N186 End stage renal disease: Secondary | ICD-10-CM | POA: Diagnosis not present

## 2023-08-29 DIAGNOSIS — N2581 Secondary hyperparathyroidism of renal origin: Secondary | ICD-10-CM | POA: Diagnosis not present

## 2023-09-01 ENCOUNTER — Telehealth: Payer: Self-pay | Admitting: Cardiology

## 2023-09-01 ENCOUNTER — Encounter (HOSPITAL_COMMUNITY): Payer: Self-pay | Admitting: Physician Assistant

## 2023-09-01 ENCOUNTER — Telehealth: Payer: Self-pay

## 2023-09-01 DIAGNOSIS — N2581 Secondary hyperparathyroidism of renal origin: Secondary | ICD-10-CM | POA: Diagnosis not present

## 2023-09-01 DIAGNOSIS — Z992 Dependence on renal dialysis: Secondary | ICD-10-CM | POA: Diagnosis not present

## 2023-09-01 DIAGNOSIS — N186 End stage renal disease: Secondary | ICD-10-CM | POA: Diagnosis not present

## 2023-09-01 NOTE — Telephone Encounter (Signed)
Patient Name: NICOLAOS LASHWAY  DOB: 05-05-1946 MRN: [id number]  Primary Cardiologist: Dina Rich, MD  Chart reviewed as part of pre-operative protocol coverage. Pre-op clearance already addressed by colleagues in earlier phone notes. To summarize recommendations:  -At the time of his visit in 06/2023, he denied any recent anginal symptoms. He has a history of remote stenting in 1999 but no recent PCI. Volume status was being managed by HD at that time. Given that his procedure is already scheduled for tomorrow, I would not plan for further cardiac testing prior to this given no recent symptoms. Would be of acceptable risk to proceed from a cardiac perspective and is moderate risk (RCRI Risk of 11.1% risk of a major cardiac event).   -Randall An, PA-C  Will route this bundled recommendation to requesting provider via Epic fax function and remove from pre-op pool. Please call with questions.  Sharlene Dory, PA-C 09/01/2023, 5:00 PM

## 2023-09-01 NOTE — Telephone Encounter (Signed)
    At the time of his visit in 06/2023, he denied any recent anginal symptoms. He has a history of remote stenting in 1999 but no recent PCI. Volume status was being managed by HD at that time. Given that his procedure is already scheduled for tomorrow, I would not plan for further cardiac testing prior to this given no recent symptoms. Would be of acceptable risk to proceed from a cardiac perspective and is moderate risk (RCRI Risk of 11.1% risk of a major cardiac event).   Signed, Ellsworth Lennox, PA-C 09/01/2023, 2:15 PM

## 2023-09-01 NOTE — Telephone Encounter (Signed)
Pre-operative Risk Assessment    Patient Name: Jacob Farmer  DOB: 11/27/1946 MRN: [id number]      Request for Surgical Clearance    Procedure:   AVS   Date of Surgery:  Clearance 09/02/23                                 Surgeon:  Dr. Randie Heinz  Surgeon's Group or Practice Name:  [facility]  Phone number:  [phone] Fax number:  [phone]   Type of Clearance Requested:   - Medical    Type of Anesthesia:   Choice    Additional requests/questions:      SignedFilomena Jungling   09/01/2023, 11:30 AM

## 2023-09-01 NOTE — Telephone Encounter (Signed)
Jacob Farmer called to check in on clearance. I let her know that provider will not be in office until 09/03/23 and will message [place] to call pt with what type appt he needs.

## 2023-09-01 NOTE — Telephone Encounter (Signed)
Pt needs cardiac clearance per anesthesia prior to his surgery. He has been made aware of this and we will r/s him once he is cleared. MD is aware.

## 2023-09-01 NOTE — Progress Notes (Signed)
Anesthesia Chart Review: Same day workup  Follows with cardiology for history of CAD (s/p stents in 1999 to LCx and RCA with CTO of LAD), paroxysmal atrial fibrillation (not on anticoagulation due to recurrent GI bleed), tachy-brady syndrome, HFpEF, orthostatic hypotension, HTN, HLD.  Last seen by Randall An, PA-C 07/03/2023.  Reported at that time his breathing has significantly proved since being started on HD.  Denied any cardiopulmonary complaints.  recent admission for anemia and AKI. He was found to have an upper GI bleed and received a total of 4 units pRBC's. 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

## 2023-09-02 ENCOUNTER — Ambulatory Visit (HOSPITAL_COMMUNITY): Admission: RE | Admit: 2023-09-02 | Payer: Medicare HMO | Source: Home / Self Care | Admitting: Vascular Surgery

## 2023-09-02 ENCOUNTER — Encounter (HOSPITAL_COMMUNITY): Admission: RE | Payer: Self-pay | Source: Home / Self Care

## 2023-09-02 SURGERY — ARTERIOVENOUS (AV) FISTULA CREATION
Anesthesia: Choice | Laterality: Left

## 2023-09-03 DIAGNOSIS — Z992 Dependence on renal dialysis: Secondary | ICD-10-CM | POA: Diagnosis not present

## 2023-09-03 DIAGNOSIS — N186 End stage renal disease: Secondary | ICD-10-CM | POA: Diagnosis not present

## 2023-09-03 DIAGNOSIS — N2581 Secondary hyperparathyroidism of renal origin: Secondary | ICD-10-CM | POA: Diagnosis not present

## 2023-09-05 DIAGNOSIS — N2581 Secondary hyperparathyroidism of renal origin: Secondary | ICD-10-CM | POA: Diagnosis not present

## 2023-09-05 DIAGNOSIS — Z992 Dependence on renal dialysis: Secondary | ICD-10-CM | POA: Diagnosis not present

## 2023-09-05 DIAGNOSIS — N186 End stage renal disease: Secondary | ICD-10-CM | POA: Diagnosis not present

## 2023-09-07 ENCOUNTER — Other Ambulatory Visit (INDEPENDENT_AMBULATORY_CARE_PROVIDER_SITE_OTHER): Payer: Self-pay | Admitting: Gastroenterology

## 2023-09-08 DIAGNOSIS — N186 End stage renal disease: Secondary | ICD-10-CM | POA: Diagnosis not present

## 2023-09-08 DIAGNOSIS — Z992 Dependence on renal dialysis: Secondary | ICD-10-CM | POA: Diagnosis not present

## 2023-09-08 DIAGNOSIS — N2581 Secondary hyperparathyroidism of renal origin: Secondary | ICD-10-CM | POA: Diagnosis not present

## 2023-09-10 DIAGNOSIS — N186 End stage renal disease: Secondary | ICD-10-CM | POA: Diagnosis not present

## 2023-09-10 DIAGNOSIS — N2581 Secondary hyperparathyroidism of renal origin: Secondary | ICD-10-CM | POA: Diagnosis not present

## 2023-09-10 DIAGNOSIS — Z992 Dependence on renal dialysis: Secondary | ICD-10-CM | POA: Diagnosis not present

## 2023-09-11 ENCOUNTER — Inpatient Hospital Stay (HOSPITAL_COMMUNITY)
Admission: EM | Admit: 2023-09-11 | Discharge: 2023-09-16 | DRG: 061 | Disposition: A | Discharge status: Rehabilitation Facility | Payer: Medicare HMO | Attending: Neurology | Admitting: Neurology

## 2023-09-11 ENCOUNTER — Other Ambulatory Visit: Payer: Self-pay

## 2023-09-11 ENCOUNTER — Encounter (HOSPITAL_COMMUNITY): Payer: Self-pay

## 2023-09-11 ENCOUNTER — Emergency Department (HOSPITAL_COMMUNITY): Payer: Medicare HMO

## 2023-09-11 DIAGNOSIS — R270 Ataxia, unspecified: Secondary | ICD-10-CM | POA: Diagnosis present

## 2023-09-11 DIAGNOSIS — E785 Hyperlipidemia, unspecified: Secondary | ICD-10-CM | POA: Diagnosis present

## 2023-09-11 DIAGNOSIS — I480 Paroxysmal atrial fibrillation: Secondary | ICD-10-CM | POA: Diagnosis present

## 2023-09-11 DIAGNOSIS — Z8546 Personal history of malignant neoplasm of prostate: Secondary | ICD-10-CM

## 2023-09-11 DIAGNOSIS — Z79899 Other long term (current) drug therapy: Secondary | ICD-10-CM | POA: Diagnosis not present

## 2023-09-11 DIAGNOSIS — Z833 Family history of diabetes mellitus: Secondary | ICD-10-CM

## 2023-09-11 DIAGNOSIS — I959 Hypotension, unspecified: Secondary | ICD-10-CM | POA: Diagnosis not present

## 2023-09-11 DIAGNOSIS — E876 Hypokalemia: Secondary | ICD-10-CM | POA: Diagnosis present

## 2023-09-11 DIAGNOSIS — I610 Nontraumatic intracerebral hemorrhage in hemisphere, subcortical: Secondary | ICD-10-CM | POA: Diagnosis present

## 2023-09-11 DIAGNOSIS — M109 Gout, unspecified: Secondary | ICD-10-CM | POA: Diagnosis present

## 2023-09-11 DIAGNOSIS — I69392 Facial weakness following cerebral infarction: Secondary | ICD-10-CM | POA: Diagnosis not present

## 2023-09-11 DIAGNOSIS — K5520 Angiodysplasia of colon without hemorrhage: Secondary | ICD-10-CM | POA: Diagnosis present

## 2023-09-11 DIAGNOSIS — G8194 Hemiplegia, unspecified affecting left nondominant side: Secondary | ICD-10-CM | POA: Diagnosis present

## 2023-09-11 DIAGNOSIS — N186 End stage renal disease: Secondary | ICD-10-CM | POA: Diagnosis present

## 2023-09-11 DIAGNOSIS — I6523 Occlusion and stenosis of bilateral carotid arteries: Secondary | ICD-10-CM | POA: Diagnosis not present

## 2023-09-11 DIAGNOSIS — R29898 Other symptoms and signs involving the musculoskeletal system: Secondary | ICD-10-CM | POA: Diagnosis not present

## 2023-09-11 DIAGNOSIS — Z8249 Family history of ischemic heart disease and other diseases of the circulatory system: Secondary | ICD-10-CM

## 2023-09-11 DIAGNOSIS — Z7982 Long term (current) use of aspirin: Secondary | ICD-10-CM

## 2023-09-11 DIAGNOSIS — Z820 Family history of epilepsy and other diseases of the nervous system: Secondary | ICD-10-CM

## 2023-09-11 DIAGNOSIS — R29810 Facial weakness: Secondary | ICD-10-CM | POA: Diagnosis present

## 2023-09-11 DIAGNOSIS — Z825 Family history of asthma and other chronic lower respiratory diseases: Secondary | ICD-10-CM

## 2023-09-11 DIAGNOSIS — Z800 Family history of malignant neoplasm of digestive organs: Secondary | ICD-10-CM

## 2023-09-11 DIAGNOSIS — I672 Cerebral atherosclerosis: Secondary | ICD-10-CM | POA: Diagnosis not present

## 2023-09-11 DIAGNOSIS — G4700 Insomnia, unspecified: Secondary | ICD-10-CM | POA: Diagnosis not present

## 2023-09-11 DIAGNOSIS — I639 Cerebral infarction, unspecified: Secondary | ICD-10-CM

## 2023-09-11 DIAGNOSIS — I2510 Atherosclerotic heart disease of native coronary artery without angina pectoris: Secondary | ICD-10-CM | POA: Diagnosis present

## 2023-09-11 DIAGNOSIS — I252 Old myocardial infarction: Secondary | ICD-10-CM

## 2023-09-11 DIAGNOSIS — Z981 Arthrodesis status: Secondary | ICD-10-CM

## 2023-09-11 DIAGNOSIS — D631 Anemia in chronic kidney disease: Secondary | ICD-10-CM | POA: Diagnosis present

## 2023-09-11 DIAGNOSIS — I611 Nontraumatic intracerebral hemorrhage in hemisphere, cortical: Principal | ICD-10-CM | POA: Diagnosis present

## 2023-09-11 DIAGNOSIS — I482 Chronic atrial fibrillation, unspecified: Secondary | ICD-10-CM | POA: Diagnosis not present

## 2023-09-11 DIAGNOSIS — N250 Renal osteodystrophy: Secondary | ICD-10-CM | POA: Diagnosis not present

## 2023-09-11 DIAGNOSIS — I6930 Unspecified sequelae of cerebral infarction: Secondary | ICD-10-CM | POA: Diagnosis not present

## 2023-09-11 DIAGNOSIS — Z992 Dependence on renal dialysis: Secondary | ICD-10-CM

## 2023-09-11 DIAGNOSIS — R7303 Prediabetes: Secondary | ICD-10-CM | POA: Diagnosis present

## 2023-09-11 DIAGNOSIS — Z955 Presence of coronary angioplasty implant and graft: Secondary | ICD-10-CM

## 2023-09-11 DIAGNOSIS — I69328 Other speech and language deficits following cerebral infarction: Secondary | ICD-10-CM | POA: Diagnosis not present

## 2023-09-11 DIAGNOSIS — M069 Rheumatoid arthritis, unspecified: Secondary | ICD-10-CM | POA: Diagnosis not present

## 2023-09-11 DIAGNOSIS — I609 Nontraumatic subarachnoid hemorrhage, unspecified: Secondary | ICD-10-CM | POA: Diagnosis present

## 2023-09-11 DIAGNOSIS — L4050 Arthropathic psoriasis, unspecified: Secondary | ICD-10-CM | POA: Diagnosis present

## 2023-09-11 DIAGNOSIS — G936 Cerebral edema: Secondary | ICD-10-CM | POA: Diagnosis not present

## 2023-09-11 DIAGNOSIS — I120 Hypertensive chronic kidney disease with stage 5 chronic kidney disease or end stage renal disease: Secondary | ICD-10-CM | POA: Diagnosis present

## 2023-09-11 DIAGNOSIS — N2581 Secondary hyperparathyroidism of renal origin: Secondary | ICD-10-CM | POA: Diagnosis present

## 2023-09-11 DIAGNOSIS — R29701 NIHSS score 1: Secondary | ICD-10-CM | POA: Diagnosis present

## 2023-09-11 DIAGNOSIS — R600 Localized edema: Secondary | ICD-10-CM | POA: Diagnosis not present

## 2023-09-11 DIAGNOSIS — R29818 Other symptoms and signs involving the nervous system: Secondary | ICD-10-CM | POA: Diagnosis not present

## 2023-09-11 DIAGNOSIS — I6501 Occlusion and stenosis of right vertebral artery: Secondary | ICD-10-CM | POA: Diagnosis not present

## 2023-09-11 DIAGNOSIS — I6389 Other cerebral infarction: Principal | ICD-10-CM | POA: Diagnosis present

## 2023-09-11 DIAGNOSIS — R531 Weakness: Secondary | ICD-10-CM | POA: Diagnosis not present

## 2023-09-11 DIAGNOSIS — Z803 Family history of malignant neoplasm of breast: Secondary | ICD-10-CM

## 2023-09-11 DIAGNOSIS — I69354 Hemiplegia and hemiparesis following cerebral infarction affecting left non-dominant side: Secondary | ICD-10-CM | POA: Diagnosis not present

## 2023-09-11 DIAGNOSIS — I6782 Cerebral ischemia: Secondary | ICD-10-CM | POA: Diagnosis not present

## 2023-09-11 DIAGNOSIS — M549 Dorsalgia, unspecified: Secondary | ICD-10-CM | POA: Diagnosis not present

## 2023-09-11 DIAGNOSIS — I10 Essential (primary) hypertension: Secondary | ICD-10-CM | POA: Diagnosis not present

## 2023-09-11 DIAGNOSIS — I953 Hypotension of hemodialysis: Secondary | ICD-10-CM | POA: Diagnosis not present

## 2023-09-11 DIAGNOSIS — Z87891 Personal history of nicotine dependence: Secondary | ICD-10-CM

## 2023-09-11 DIAGNOSIS — Z8489 Family history of other specified conditions: Secondary | ICD-10-CM

## 2023-09-11 HISTORY — DX: Cerebral infarction, unspecified: I63.9

## 2023-09-11 LAB — I-STAT CHEM 8, ED
BUN: 26 mg/dL — ABNORMAL HIGH (ref 8–23)
Calcium, Ion: 0.96 mmol/L — ABNORMAL LOW (ref 1.15–1.40)
Chloride: 94 mmol/L — ABNORMAL LOW (ref 98–111)
Creatinine, Ser: 4.7 mg/dL — ABNORMAL HIGH (ref 0.61–1.24)
Glucose, Bld: 156 mg/dL — ABNORMAL HIGH (ref 70–99)
HCT: 33 % — ABNORMAL LOW (ref 39.0–52.0)
Hemoglobin: 11.2 g/dL — ABNORMAL LOW (ref 13.0–17.0)
Potassium: 3.1 mmol/L — ABNORMAL LOW (ref 3.5–5.1)
Sodium: 132 mmol/L — ABNORMAL LOW (ref 135–145)
TCO2: 25 mmol/L (ref 22–32)

## 2023-09-11 LAB — CBC
HCT: 33.4 % — ABNORMAL LOW (ref 39.0–52.0)
Hemoglobin: 10.9 g/dL — ABNORMAL LOW (ref 13.0–17.0)
MCH: 29.2 pg (ref 26.0–34.0)
MCHC: 32.6 g/dL (ref 30.0–36.0)
MCV: 89.5 fL (ref 80.0–100.0)
Platelets: 162 10*3/uL (ref 150–400)
RBC: 3.73 MIL/uL — ABNORMAL LOW (ref 4.22–5.81)
RDW: 14.5 % (ref 11.5–15.5)
WBC: 6.1 10*3/uL (ref 4.0–10.5)
nRBC: 0 % (ref 0.0–0.2)

## 2023-09-11 LAB — DIFFERENTIAL
Abs Immature Granulocytes: 0.01 10*3/uL (ref 0.00–0.07)
Basophils Absolute: 0 10*3/uL (ref 0.0–0.1)
Basophils Relative: 0 %
Eosinophils Absolute: 0.3 10*3/uL (ref 0.0–0.5)
Eosinophils Relative: 4 %
Immature Granulocytes: 0 %
Lymphocytes Relative: 18 %
Lymphs Abs: 1.1 10*3/uL (ref 0.7–4.0)
Monocytes Absolute: 0.6 10*3/uL (ref 0.1–1.0)
Monocytes Relative: 10 %
Neutro Abs: 4.1 10*3/uL (ref 1.7–7.7)
Neutrophils Relative %: 68 %

## 2023-09-11 LAB — COMPREHENSIVE METABOLIC PANEL
ALT: 16 U/L (ref 0–44)
AST: 20 U/L (ref 15–41)
Albumin: 3.6 g/dL (ref 3.5–5.0)
Alkaline Phosphatase: 98 U/L (ref 38–126)
Anion gap: 10 (ref 5–15)
BUN: 28 mg/dL — ABNORMAL HIGH (ref 8–23)
CO2: 29 mmol/L (ref 22–32)
Calcium: 8.6 mg/dL — ABNORMAL LOW (ref 8.9–10.3)
Chloride: 92 mmol/L — ABNORMAL LOW (ref 98–111)
Creatinine, Ser: 4.15 mg/dL — ABNORMAL HIGH (ref 0.61–1.24)
GFR, Estimated: 14 mL/min — ABNORMAL LOW (ref >60)
Glucose, Bld: 161 mg/dL — ABNORMAL HIGH (ref 70–99)
Potassium: 3 mmol/L — ABNORMAL LOW (ref 3.5–5.1)
Sodium: 131 mmol/L — ABNORMAL LOW (ref 135–145)
Total Bilirubin: 0.5 mg/dL (ref 0.3–1.2)
Total Protein: 6.9 g/dL (ref 6.5–8.1)

## 2023-09-11 LAB — PROTIME-INR
INR: 0.9 (ref 0.8–1.2)
Prothrombin Time: 12.3 seconds (ref 11.4–15.2)

## 2023-09-11 LAB — APTT: aPTT: 24 seconds (ref 24–36)

## 2023-09-11 LAB — ETHANOL: Alcohol, Ethyl (B): <10 mg/dL (ref <10)

## 2023-09-11 LAB — CBG MONITORING, ED: Glucose-Capillary: 142 mg/dL — ABNORMAL HIGH (ref 70–99)

## 2023-09-11 MED ORDER — TENECTEPLASE FOR STROKE: 0.2500 mg/kg | PACK | Freq: Once | INTRAVENOUS | Status: AC

## 2023-09-11 MED ORDER — IOHEXOL 350 MG/ML SOLN
75.0000 mL | Freq: Once | INTRAVENOUS | Status: AC | PRN
  Administered 2023-09-11: 75 mL via INTRAVENOUS

## 2023-09-11 MED ORDER — TENECTEPLASE FOR STROKE
PACK | INTRAVENOUS | Status: AC
  Administered 2023-09-11: 18 mg via INTRAVENOUS
  Filled 2023-09-11: qty 10

## 2023-09-11 NOTE — Progress Notes (Addendum)
2130: Code stroke cart activated at this time. EDP at bedside assessing patient.   2135: Pt to CT at this time.   2138: TSP paged   2141: Pt returned from CT.   2142: TSP on camera at this time to assess patient.   2150: Results of NCCT provided to TSP.   2202: Time out performed for TNK admin.   2206: TNK 18mg  given at this time.   2208: TSP off camera at this time. TSP to follow up with EDP for plan of care.   2211: Pt back to CT for advanced imaging.   2220: Pt back to room from CT.   2243: TSRN off cart at this time. TSRN reviewd Post TNK care with bedside RN.   2255: Results of CTA provided to TSP at this time.

## 2023-09-11 NOTE — ED Notes (Signed)
Neurologist discussing TNK with patient. Reviewing risk/ benefits with pt / spouse

## 2023-09-11 NOTE — ED Triage Notes (Signed)
Arrives EMS from home with weakness that manifested around 8PM when getting ready for bed.   Enroute at 2130 pt displayed left sided facial droop noticed by paramedics. Upon arrival pt scores NIH - 4. Left arm ataxia observed with minimal improvement after CT. Left arm sensory deficit.   Dialysis M, W, F  Not currently on anticoagulants.

## 2023-09-11 NOTE — ED Provider Notes (Signed)
[place] [facility] DE[facility]MENT AT [facility] Provider Note   CSN: [id number] Arrival date & time: 09/11/23  2130  An emergency department physician performed an initial assessment on this suspected stroke patient at 2133.  History  Chief Complaint  Patient presents with   Code Stroke    Jacob Farmer is a 77 y.o. male.  77 year old male with past medical history of end-stage renal disease on dialysis Mondays, Wednesdays, and Fridays, hypertension, and atrial fibrillation presenting to the emergency department today with weakness in the left upper extremity.  The patient states this began around 9 PM.  He states he was getting ready go to bed and he felt like he did not have control of his arm.  He was initially just numbness but as medics were bringing him here he is complaining of weakness.  He denies any significant headaches.  Denies any chest pain.  Denies any associated shortness of breath.  He was brought to the emergency department for further evaluation.  He was on Eliquis in the past but this was discontinued due to GI bleeding.        Home Medications Prior to Admission medications   Medication Sig Start Date End Date Taking? Authorizing Provider  allopurinol (ZYLOPRIM) 100 MG tablet TAKE 1/2 TABLET EVERY OTHER DAY 07/28/23  Yes Gearldine Bienenstock, PA-C  amLODipine (NORVASC) 10 MG tablet Take 1 tablet (10 mg total) by mouth daily. 07/03/23  Yes Strader, [place] M, PA-C  calcitRIOL (ROCALTROL) 0.25 MCG capsule Take 0.25 mcg by mouth every Monday, Wednesday, and Friday.   Yes [provider]  Dupilumab (DUPIXENT) 300 MG/2ML SOPN Inject 300 mg into the skin every 14 (fourteen) days. Starting at day 15 for maintenance. 07/17/23  Yes Deirdre Evener, MD  folic acid (FOLVITE) 400 MCG tablet Take 400 mcg by mouth daily.   Yes [provider]  hydrALAZINE (APRESOLINE) 25 MG tablet Take 1 tablet (25 mg total) by mouth 2 (two) times daily. 08/07/23 11/05/23  Yes Anabel Halon, MD  leflunomide (ARAVA) 10 MG tablet TAKE 1 TABLET EVERY DAY 08/19/23  Yes Gearldine Bienenstock, PA-C  Leuprolide Acetate (ELIGARD [place]) Inject 1 Dose into the skin every 4 (four) months.   Yes [provider]  nitroGLYCERIN (NITROSTAT) 0.4 MG SL tablet Place 0.4 mg under the tongue every 5 (five) minutes as needed for chest pain.  08/03/19  Yes [provider]  pantoprazole (PROTONIX) 40 MG tablet TAKE 1 TABLET TWICE DAILY 09/08/23  Yes Marguerita Merles, Reuel Boom, MD  RENVELA 800 MG tablet Take 800 mg by mouth 2 (two) times daily with a meal. 08/05/23  Yes [provider]  rosuvastatin (CRESTOR) 40 MG tablet Take 1 tablet (40 mg total) by mouth daily. 07/03/23  Yes Strader, [place] M, PA-C  triamcinolone cream (KENALOG) 0.1 % Apply 1 Application topically daily. Patient taking differently: Apply 1 Application topically daily as needed (irritation). 02/17/23  Yes Cleora Fleet, MD      Allergies    Patient has no known allergies.    Review of Systems   Review of Systems  Neurological:  Positive for weakness and numbness.  All other systems reviewed and are negative.   Physical Exam Updated Vital Signs BP (!) 153/82   Pulse 82   Temp 98.1 F (36.7 C)   Resp 13   Ht 5\' 10"  (1.778 m)   Wt 70.3 kg   SpO2 99%   BMI 22.24 kg/m  Physical Exam  Vitals and nursing note reviewed.   Gen: NAD Eyes: PERRL, EOMI HEENT: no oropharyngeal swelling Neck: trachea midline Resp: clear to auscultation bilaterally Card: RRR, no murmurs, rubs, or gallops Abd: nontender, nondistended Extremities: no calf tenderness, no edema Vascular: 2+ radial pulses bilaterally, 2+ DP pulses bilaterally Neuro: See NIH stroke scale Skin: no rashes Psyc: acting appropriately   ED Results / Procedures / Treatments   Labs (all labs ordered are listed, but only abnormal results are displayed) Labs Reviewed  CBC - Abnormal; Notable for the following components:       Result Value   RBC 3.73 (*)    Hemoglobin 10.9 (*)    HCT 33.4 (*)    All other components within normal limits  COMPREHENSIVE METABOLIC PANEL - Abnormal; Notable for the following components:   Sodium 131 (*)    Potassium 3.0 (*)    Chloride 92 (*)    Glucose, Bld 161 (*)    BUN 28 (*)    Creatinine, Ser 4.15 (*)    Calcium 8.6 (*)    GFR, Estimated 14 (*)    All other components within normal limits  CBG MONITORING, ED - Abnormal; Notable for the following components:   Glucose-Capillary 142 (*)    All other components within normal limits  I-STAT CHEM 8, ED - Abnormal; Notable for the following components:   Sodium 132 (*)    Potassium 3.1 (*)    Chloride 94 (*)    BUN 26 (*)    Creatinine, Ser 4.70 (*)    Glucose, Bld 156 (*)    Calcium, Ion 0.96 (*)    Hemoglobin 11.2 (*)    HCT 33.0 (*)    All other components within normal limits  ETHANOL  PROTIME-INR  APTT  DIFFERENTIAL  RAPID URINE DRUG SCREEN, HOSP PERFORMED  URINALYSIS, ROUTINE W REFLEX MICROSCOPIC    EKG EKG Interpretation Date/Time:  Thursday September 11 2023 21:52:21 EDT Ventricular Rate:  84 PR Interval:  236 QRS Duration:  166 QT Interval:  444 QTC Calculation: 525 R Axis:   -79  Text Interpretation: Sinus rhythm Prolonged PR interval Consider right atrial enlargement RBBB and LAFB Baseline wander in lead(s) V3 Confirmed by Beckey Downing [phone]) on 09/11/2023 11:05:34 PM  Radiology CT ANGIO HEAD NECK W WO CM (CODE STROKE)  Result Date: 09/11/2023 CLINICAL DATA:  Initial evaluation for neuro deficit, stroke suspected, left upper extremity weakness. EXAM: CT ANGIOGRAPHY HEAD AND NECK WITH AND WITHOUT CONTRAST TECHNIQUE: Multidetector CT imaging of the head and neck was performed using the standard protocol during bolus administration of intravenous contrast. Multiplanar CT image reconstructions and MIPs were obtained to evaluate the vascular anatomy. Carotid stenosis measurements (when applicable) are  obtained utilizing NASCET criteria, using the distal internal carotid diameter as the denominator. RADIATION DOSE REDUCTION: This exam was performed according to the departmental dose-optimization program which includes automated exposure control, adjustment of the mA and/or kV according to patient size and/or use of iterative reconstruction technique. CONTRAST:  75mL OMNIPAQUE IOHEXOL 350 MG/ML SOLN COMPARISON:  CT from earlier the same day. FINDINGS: CTA NECK FINDINGS Aortic arch: Visualized aortic arch within normal limits for caliber. Severe atheromatous change about the arch and distal descending intrathoracic aorta with markedly irregular soft plaque about the aortic lumen. Superimposed multifocal penetrating plaques noted no high-grade stenosis about the origin the great vessels. Right carotid system: Right common and internal carotid arteries are patent without dissection. Atheromatous plaque about the right carotid bulb without hemodynamically  significant greater than 50% stenosis. Left carotid system: Left common and internal carotid arteries are patent without dissection. Atheromatous plaque about the left carotid bulb/proximal left ICA with associated stenosis of up to approximate 50% by NASCET criteria. Vertebral arteries: Both vertebral arteries arise from subclavian arteries. No significant proximal subclavian artery stenosis. 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 Vertebral arteries are otherwise patent without stenosis or dissection. Skeleton: No worrisome osseous lesions. Moderate to advanced multilevel cervical spondylosis. Patient is edentulous. Other neck: No other acute finding. 2.3 cm sebaceous cyst noted within the lateral left neck. Right-sided central venous catheter in place. Upper chest: No other acute finding. Review of the MIP images confirms the above findings CTA HEAD FINDINGS Anterior circulation: Atheromatous change about the carotid siphons with  associated moderate to severe stenoses at the para clinoid segments bilaterally. A1 segments patent bilaterally. Normal anterior communicating artery complex. Anterior cerebral arteries patent without significant stenosis. No M1 stenosis or occlusion. No proximal MCA branch occlusion or high-grade stenosis. Distal MCA branches perfused and symmetric. Posterior circulation: Dominant right V4 segment patent without significant stenosis. Focal severe stenosis seen involving the mid-distal left V4 segment (series 6, image 282). Left PICA patent at its origin. Right PICA not well seen. Basilar patent without stenosis. Superior cerebellar and posterior cerebral arteries patent bilaterally. Venous sinuses: Patent allowing for timing the contrast bolus. Anatomic variants: None significant.  No aneurysm. Review of the MIP images confirms the above findings IMPRESSION: 1. Negative CTA for large vessel occlusion or other emergent finding. 2. Atheromatous plaque about the left carotid bulb/proximal left ICA with associated stenosis of up to approximate 50% by NASCET criteria. 3. 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 4. Severe atheromatous stenosis involving the mid-distal left V4 segment. 5. Atheromatous change about the carotid siphons with associated moderate to severe stenoses at the para clinoid segments bilaterally. 6. Severe atheromatous change about the aortic arch an proximal descending intrathoracic aorta with associated multifocal intimal irregularity and penetrating plaques. Aortic Atherosclerosis (ICD10-I70.0). Electronically Signed   By: Rise Mu M.D.   On: 09/11/2023 22:50   CT HEAD CODE STROKE WO CONTRAST  Result Date: 09/11/2023 CLINICAL DATA:  Code stroke. Neuro deficit, acute, stroke suspected. Left-sided weakness. EXAM: CT HEAD WITHOUT CONTRAST TECHNIQUE: Contiguous axial images were obtained from the base of the skull through the vertex without intravenous  contrast. RADIATION DOSE REDUCTION: This exam was performed according to the departmental dose-optimization program which includes automated exposure control, adjustment of the mA and/or kV according to patient size and/or use of iterative reconstruction technique. COMPARISON:  None Available. FINDINGS: Brain: No acute intracranial hemorrhage. Gray-white differentiation is preserved. No hydrocephalus or extra-axial collection. No mass effect or midline shift. Vascular: No hyperdense vessel or unexpected calcification. Skull: No calvarial fracture or suspicious bone lesion. Skull base is unremarkable. Sinuses/Orbits: No acute finding. Other: None. ASPECTS ([place] Stroke Program Early CT Score) - Ganglionic level infarction (caudate, lentiform nuclei, internal capsule, insula, M1-M3 cortex): 7 - Supraganglionic infarction (M4-M6 cortex): 3 Total score (0-10 with 10 being normal): 10 IMPRESSION: No acute intracranial hemorrhage or evidence of acute large vessel territory infarct. ASPECT score is 10. Code stroke imaging results were communicated on 09/11/2023 at 9:47 pm to provider Dr. Rhae Hammock via telephone, who verbally acknowledged these results. Electronically Signed   By: Orvan Falconer M.D.   On: 09/11/2023 21:47    Procedures Procedures    Medications Ordered in ED Medications  tenecteplase (TNKASE) injection for Stroke 18 mg (18 mg Intravenous Given 09/11/23 2206)  iohexol (OMNIPAQUE) 350 MG/ML injection 75 mL (75 mLs Intravenous Contrast Given 09/11/23 2215)    ED Course/ Medical Decision Making/ A&P                                 Medical Decision Making 77 year old male with past medical history of end-stage renal disease, hypertension, hyperlipidemia, and atrial fibrillation presenting to the emergency department today with symptoms concerning for CVA.  The patient symptoms started roughly 40 minutes prior to arrival.  A code stroke is initiated.  Will discuss his case with neurology for further  management.  The patient's head CT was negative.  I did discuss the patient's case with Dr. Imogene Burn.  He did administer TNK.  The patient's blood pressures are within acceptable range.  He will be transferred to [facility] ICU for further management.  Critical care time 40 minutes including reassessments, review of old records, coordination of care with neurology  Amount and/or Complexity of Data Reviewed Labs: ordered. Radiology: ordered.  Risk Decision regarding hospitalization.           Final Clinical Impression(s) / ED Diagnoses Final diagnoses:  Acute CVA (cerebrovascular accident) [facility])    Rx / DC Orders ED Discharge Orders     None         Durwin Glaze, MD 09/11/23 2310

## 2023-09-11 NOTE — ED Notes (Signed)
Pt verbalizes " I don't know if the medicine worked but I can use my left arm without having to think a bout it. I feel ike it helped me out. I can feel my arm when you touch it also".

## 2023-09-11 NOTE — ED Notes (Addendum)
ED TO INPATIENT HANDOFF REPORT  ED Nurse Name and Phone #: Manus Gunning, RN   S Name/Age/Gender Jacob Farmer 77 y.o. male Room/Bed: APA07/APA07  Code Status   Code Status: Prior  Home/SNF/Other Home Patient oriented to: self, place, time, and situation Is this baseline? Yes   Triage Complete: Triage complete  Chief Complaint Acute CVA (cerebrovascular accident) [facility]) [I63.9]  Triage Note Pt bib [facility] for code stroke, called in route, paged out by ED sec at 2127, upon pt arrival, lab, Dr Rhae Hammock, and primary nurse, Riki Rusk at bedside, neuro cart activated and stroke nurse currently talking with pt, EDP and nurse. Pt cleared for CT and being transported at this time with nurse and neuro cart. Pt able to move all extremities, some weakness noted to left arm, pt has clear speech, alert and oriented x 4. Pt has very mild asymmetry to nasolabial fold on left side of face.   Arrives EMS from home with weakness that manifested around 8PM when getting ready for bed.   Enroute at 2130 pt displayed left sided facial droop noticed by paramedics. Upon arrival pt scores NIH - 4. Left arm ataxia observed with minimal improvement after CT. Left arm sensory deficit.   Dialysis M, W, F  Not currently on anticoagulants.    Allergies No Known Allergies  Level of Care/Admitting Diagnosis ED Disposition     ED Disposition  Admit   Condition  --   Comment  Hospital Area: [facility] [[id number]]  Level of Care: ICU [6]  May admit patient to [facility] or [facility] if equivalent level of care is available:: No  Interfacility transfer: Yes  Covid Evaluation: Asymptomatic - no recent exposure (last 10 days) testing not required  Diagnosis: Acute CVA (cerebrovascular accident) [facility]) [[id number]]  Admitting Physician: Lynnda Child [[id number]]  Attending Physician: Lynnda Child [[id number]]  Certification:: I certify this patient will need inpatient services for at least 2 midnights  Expected  Medical Readiness: 09/14/2023          B Medical/Surgery History Past Medical History:  Diagnosis Date   CAD (coronary artery disease)    Acute myocardial infarction treated with TPA in 1990; 1999-stents to circumflex and RCA; residual total occlusion of the left anterior descending; normal ejection fraction; stress nuclear in 2001-distal anteroseptal ischemia; normal LV function   CKD (chronic kidney disease) stage 4, GFR 15-29 ml/min (HCC)    First degree heart block    History of gastric ulcer    History of kidney stones    History of MI (myocardial infarction)    1990-  treated w/ TPA   Hyperlipidemia    Hypertension    Jaundice    OA (osteoarthritis)    Prediabetes    Prostate cancer (HCC)    Stage T1c , Gleason 3+4,  PSA 4.7,  vol 24cc--  scheduled for radiative seed implants   Psoriatic arthritis (HCC)    RA (rheumatoid arthritis) (HCC)    Dr. Beatrix Fetters   Sinus bradycardia    Wears dentures    Past Surgical History:  Procedure Laterality Date   BIOPSY  08/27/2019   Procedure: BIOPSY;  Surgeon: Malissa Hippo, MD;  [facility];  Service: Endoscopy;;  gastric   BIOPSY  04/03/2022   Procedure: BIOPSY;  Surgeon: Dolores Frame, MD;  [facility];  Service: Gastroenterology;;   BIOPSY  04/21/2023   Procedure: BIOPSY;  Surgeon: Lanelle Bal, DO;  [facility];  Service: Endoscopy;;   callus removal Left 09/13/2022   left foot   CARDIOVASCULAR STRESS TEST  2001  per Dr Dietrich Pates clinic note   distal anteroseptal ischemia,  normal LVF   COLONOSCOPY  last one 2006 (approx)   COLONOSCOPY WITH PROPOFOL N/A 04/03/2022   Procedure: COLONOSCOPY WITH PROPOFOL;  Surgeon: Dolores Frame, MD;  [facility];  Service: Gastroenterology;  Laterality: N/A;  945   CORONARY ANGIOPLASTY WITH STENT PLACEMENT  1999   DES x2  to CFX and RCA/  residual total occlusion LAD,  normal LVEF   ENTEROSCOPY N/A 04/24/2023    Procedure: ENTEROSCOPY;  Surgeon: Dolores Frame, MD;  [facility];  Service: Gastroenterology;  Laterality: N/A;   ESOPHAGOGASTRODUODENOSCOPY (EGD) WITH PROPOFOL N/A 08/27/2019   Procedure: ESOPHAGOGASTRODUODENOSCOPY (EGD) WITH PROPOFOL;  Surgeon: Malissa Hippo, MD;  [facility];  Service: Endoscopy;  Laterality: N/A;  10:10   ESOPHAGOGASTRODUODENOSCOPY (EGD) WITH PROPOFOL N/A 04/03/2022   Procedure: ESOPHAGOGASTRODUODENOSCOPY (EGD) WITH PROPOFOL;  Surgeon: Dolores Frame, MD;  [facility];  Service: Gastroenterology;  Laterality: N/A;   ESOPHAGOGASTRODUODENOSCOPY (EGD) WITH PROPOFOL N/A 02/16/2023   Procedure: ESOPHAGOGASTRODUODENOSCOPY (EGD) WITH PROPOFOL;  Surgeon: Dolores Frame, MD;  [facility];  Service: Gastroenterology;  Laterality: N/A;   ESOPHAGOGASTRODUODENOSCOPY (EGD) WITH PROPOFOL N/A 04/21/2023   Procedure: ESOPHAGOGASTRODUODENOSCOPY (EGD) WITH PROPOFOL;  Surgeon: Lanelle Bal, DO;  [facility];  Service: Endoscopy;  Laterality: N/A;   EYE SURGERY Bilateral    cataract   GIVENS CAPSULE STUDY N/A 04/22/2023   Procedure: GIVENS CAPSULE STUDY;  Surgeon: Dolores Frame, MD;  [facility];  Service: Gastroenterology;  Laterality: N/A;   HEMOSTASIS CLIP PLACEMENT  04/24/2023   Procedure: HEMOSTASIS CLIP PLACEMENT;  Surgeon: Dolores Frame, MD;  [facility];  Service: Gastroenterology;;   HOT HEMOSTASIS  04/03/2022   Procedure: HOT HEMOSTASIS (ARGON PLASMA COAGULATION/BICAP);  Surgeon: Marguerita Merles, Reuel Boom, MD;  [facility];  Service: Gastroenterology;;   HOT HEMOSTASIS  04/24/2023   Procedure: HOT HEMOSTASIS (ARGON PLASMA COAGULATION/BICAP);  Surgeon: Marguerita Merles, Reuel Boom, MD;  [facility];  Service: Gastroenterology;;   IR FLUORO GUIDE CV LINE RIGHT  05/30/2023   IR US GUIDE VASC ACCESS RIGHT  05/30/2023   POLYPECTOMY   04/03/2022   Procedure: POLYPECTOMY;  Surgeon: Dolores Frame, MD;  [facility];  Service: Gastroenterology;;   POSTERIOR CERVICAL FUSION/FORAMINOTOMY N/A 05/26/2017   Procedure: RIGHT C4-5 FORAMINOTOMY WITH EXCISION OF HERNIATED DISC;  Surgeon: Kerrin Champagne, MD;  [facility] OR;  Service: Orthopedics;  Laterality: N/A;   RADIOACTIVE SEED IMPLANT N/A 01/11/2016   Procedure: RADIOACTIVE SEED IMPLANT/BRACHYTHERAPY IMPLANT;  Surgeon: Marcine Matar, MD;  Location: [facility];  Service: Urology;  Laterality: N/A;   73  seeds implanted no seeds founds in bladder   SCLEROTHERAPY  04/24/2023   Procedure: SCLEROTHERAPY;  Surgeon: Marguerita Merles, Reuel Boom, MD;  [facility];  Service: Gastroenterology;;   SUBMUCOSAL TATTOO INJECTION  04/24/2023   Procedure: SUBMUCOSAL TATTOO INJECTION;  Surgeon: Dolores Frame, MD;  [facility];  Service: Gastroenterology;;     A IV Location/Drains/Wounds Patient Lines/Drains/Airways Status     Active Line/Drains/Airways     Name Placement date Placement time Site Days   Peripheral IV 09/11/23 20 G 1" Left Antecubital 09/11/23  --  Antecubital  less than 1   Peripheral IV 09/11/23 18  G 1.16" Right Antecubital 09/11/23  2202  Antecubital  less than 1   Hemodialysis Catheter Right Internal jugular Double lumen Permanent (Tunneled) 05/30/23  1447  Internal jugular  104   Airway 02/16/23  0856  -- 207            Intake/Output Last 24 hours No intake or output data in the 24 hours ending 09/11/23 2309  Labs/Imaging Results for orders placed or performed during the hospital encounter of 09/11/23 (from the past 48 hour(s))  CBG monitoring, ED     Status: Abnormal   Collection Time: 09/11/23  9:32 PM  Result Value Ref Range   Glucose-Capillary 142 (H) 70 - 99 mg/dL    Comment: Glucose reference range applies only to samples taken after fasting for at least 8 hours.  Ethanol      Status: None   Collection Time: 09/11/23  9:33 PM  Result Value Ref Range   Alcohol, Ethyl (B) <10 <10 mg/dL    Comment: (NOTE) Lowest detectable limit for serum alcohol is 10 mg/dL.  For medical purposes only. Performed at [facility], [address], [place], [place] [address]   Protime-INR     Status: None   Collection Time: 09/11/23  9:33 PM  Result Value Ref Range   Prothrombin Time 12.3 11.4 - 15.2 seconds   INR 0.9 0.8 - 1.2    Comment: (NOTE) INR goal varies based on device and disease states. Performed at [facility], [address], [place], [place] [address]   APTT     Status: None   Collection Time: 09/11/23  9:33 PM  Result Value Ref Range   aPTT 24 24 - 36 seconds    Comment: Performed at [facility], [address] [place], [place] [address]  CBC     Status: Abnormal   Collection Time: 09/11/23  9:33 PM  Result Value Ref Range   WBC 6.1 4.0 - 10.5 K/uL   RBC 3.73 (L) 4.22 - 5.81 MIL/uL   Hemoglobin 10.9 (L) 13.0 - 17.0 g/dL   HCT 21.3 (L) 08.6 - 57.8 %   MCV 89.5 80.0 - 100.0 fL   MCH 29.2 26.0 - 34.0 pg   MCHC 32.6 30.0 - 36.0 g/dL   RDW 46.9 62.9 - 52.8 %   Platelets 162 150 - 400 K/uL   nRBC 0.0 0.0 - 0.2 %    Comment: Performed at [facility], [address], [place], [place] [address]  Differential     Status: None   Collection Time: 09/11/23  9:33 PM  Result Value Ref Range   Neutrophils Relative % 68 %   Neutro Abs 4.1 1.7 - 7.7 K/uL   Lymphocytes Relative 18 %   Lymphs Abs 1.1 0.7 - 4.0 K/uL   Monocytes Relative 10 %   Monocytes Absolute 0.6 0.1 - 1.0 K/uL   Eosinophils Relative 4 %   Eosinophils Absolute 0.3 0.0 - 0.5 K/uL   Basophils Relative 0 %   Basophils Absolute 0.0 0.0 - 0.1 K/uL   Immature Granulocytes 0 %   Abs Immature Granulocytes 0.01 0.00 - 0.07 K/uL    Comment: Performed at [facility], [address], [place], [place] [address]  Comprehensive metabolic panel     Status: Abnormal   Collection Time: 09/11/23  9:33 PM   Result Value Ref Range   Sodium 131 (L) 135 - 145 mmol/L   Potassium 3.0 (L) 3.5 - 5.1 mmol/L   Chloride 92 (L)  98 - 111 mmol/L   CO2 29 22 - 32 mmol/L   Glucose, Bld 161 (H) 70 - 99 mg/dL    Comment: Glucose reference range applies only to samples taken after fasting for at least 8 hours.   BUN 28 (H) 8 - 23 mg/dL   Creatinine, Ser 1.61 (H) 0.61 - 1.24 mg/dL   Calcium 8.6 (L) 8.9 - 10.3 mg/dL   Total Protein 6.9 6.5 - 8.1 g/dL   Albumin 3.6 3.5 - 5.0 g/dL   AST 20 15 - 41 U/L   ALT 16 0 - 44 U/L   Alkaline Phosphatase 98 38 - 126 U/L   Total Bilirubin 0.5 0.3 - 1.2 mg/dL   GFR, Estimated 14 (L) >60 mL/min    Comment: (NOTE) Calculated using the CKD-EPI Creatinine Equation (2021)    Anion gap 10 5 - 15    Comment: Performed at [facility], [address], [place], [place] [address]  I-stat chem 8, ED     Status: Abnormal   Collection Time: 09/11/23  9:36 PM  Result Value Ref Range   Sodium 132 (L) 135 - 145 mmol/L   Potassium 3.1 (L) 3.5 - 5.1 mmol/L   Chloride 94 (L) 98 - 111 mmol/L   BUN 26 (H) 8 - 23 mg/dL   Creatinine, Ser 5.40 (H) 0.61 - 1.24 mg/dL   Glucose, Bld 981 (H) 70 - 99 mg/dL    Comment: Glucose reference range applies only to samples taken after fasting for at least 8 hours.   Calcium, Ion 0.96 (L) 1.15 - 1.40 mmol/L   TCO2 25 22 - 32 mmol/L   Hemoglobin 11.2 (L) 13.0 - 17.0 g/dL   HCT 19.1 (L) 47.8 - 29.5 %   CT ANGIO HEAD NECK W WO CM (CODE STROKE)  Result Date: 09/11/2023 CLINICAL DATA:  Initial evaluation for neuro deficit, stroke suspected, left upper extremity weakness. EXAM: CT ANGIOGRAPHY HEAD AND NECK WITH AND WITHOUT CONTRAST TECHNIQUE: Multidetector CT imaging of the head and neck was performed using the standard protocol during bolus administration of intravenous contrast. Multiplanar CT image reconstructions and MIPs were obtained to evaluate the vascular anatomy. Carotid stenosis measurements (when applicable) are obtained utilizing NASCET  criteria, using the distal internal carotid diameter as the denominator. RADIATION DOSE REDUCTION: This exam was performed according to the departmental dose-optimization program which includes automated exposure control, adjustment of the mA and/or kV according to patient size and/or use of iterative reconstruction technique. CONTRAST:  75mL OMNIPAQUE IOHEXOL 350 MG/ML SOLN COMPARISON:  CT from earlier the same day. FINDINGS: CTA NECK FINDINGS Aortic arch: Visualized aortic arch within normal limits for caliber. Severe atheromatous change about the arch and distal descending intrathoracic aorta with markedly irregular soft plaque about the aortic lumen. Superimposed multifocal penetrating plaques noted no high-grade stenosis about the origin the great vessels. Right carotid system: Right common and internal carotid arteries are patent without dissection. Atheromatous plaque about the right carotid bulb without hemodynamically significant greater than 50% stenosis. Left carotid system: Left common and internal carotid arteries are patent without dissection. Atheromatous plaque about the left carotid bulb/proximal left ICA with associated stenosis of up to approximate 50% by NASCET criteria. Vertebral arteries: Both vertebral arteries arise from subclavian arteries. No significant proximal subclavian artery stenosis. 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 Vertebral arteries are otherwise patent without stenosis or dissection. Skeleton: No worrisome osseous lesions. Moderate to advanced multilevel cervical spondylosis. Patient is edentulous. Other  neck: No other acute finding. 2.3 cm sebaceous cyst noted within the lateral left neck. Right-sided central venous catheter in place. Upper chest: No other acute finding. Review of the MIP images confirms the above findings CTA HEAD FINDINGS Anterior circulation: Atheromatous change about the carotid siphons with associated moderate to  severe stenoses at the para clinoid segments bilaterally. A1 segments patent bilaterally. Normal anterior communicating artery complex. Anterior cerebral arteries patent without significant stenosis. No M1 stenosis or occlusion. No proximal MCA branch occlusion or high-grade stenosis. Distal MCA branches perfused and symmetric. Posterior circulation: Dominant right V4 segment patent without significant stenosis. Focal severe stenosis seen involving the mid-distal left V4 segment (series 6, image 282). Left PICA patent at its origin. Right PICA not well seen. Basilar patent without stenosis. Superior cerebellar and posterior cerebral arteries patent bilaterally. Venous sinuses: Patent allowing for timing the contrast bolus. Anatomic variants: None significant.  No aneurysm. Review of the MIP images confirms the above findings IMPRESSION: 1. Negative CTA for large vessel occlusion or other emergent finding. 2. Atheromatous plaque about the left carotid bulb/proximal left ICA with associated stenosis of up to approximate 50% by NASCET criteria. 3. 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 4. Severe atheromatous stenosis involving the mid-distal left V4 segment. 5. Atheromatous change about the carotid siphons with associated moderate to severe stenoses at the para clinoid segments bilaterally. 6. Severe atheromatous change about the aortic arch an proximal descending intrathoracic aorta with associated multifocal intimal irregularity and penetrating plaques. Aortic Atherosclerosis (ICD10-I70.0). Electronically Signed   By: Rise Mu M.D.   On: 09/11/2023 22:50   CT HEAD CODE STROKE WO CONTRAST  Result Date: 09/11/2023 CLINICAL DATA:  Code stroke. Neuro deficit, acute, stroke suspected. Left-sided weakness. EXAM: CT HEAD WITHOUT CONTRAST TECHNIQUE: Contiguous axial images were obtained from the base of the skull through the vertex without intravenous contrast. RADIATION  DOSE REDUCTION: This exam was performed according to the departmental dose-optimization program which includes automated exposure control, adjustment of the mA and/or kV according to patient size and/or use of iterative reconstruction technique. COMPARISON:  None Available. FINDINGS: Brain: No acute intracranial hemorrhage. Gray-white differentiation is preserved. No hydrocephalus or extra-axial collection. No mass effect or midline shift. Vascular: No hyperdense vessel or unexpected calcification. Skull: No calvarial fracture or suspicious bone lesion. Skull base is unremarkable. Sinuses/Orbits: No acute finding. Other: None. ASPECTS ([place] Stroke Program Early CT Score) - Ganglionic level infarction (caudate, lentiform nuclei, internal capsule, insula, M1-M3 cortex): 7 - Supraganglionic infarction (M4-M6 cortex): 3 Total score (0-10 with 10 being normal): 10 IMPRESSION: No acute intracranial hemorrhage or evidence of acute large vessel territory infarct. ASPECT score is 10. Code stroke imaging results were communicated on 09/11/2023 at 9:47 pm to provider Dr. Rhae Hammock via telephone, who verbally acknowledged these results. Electronically Signed   By: Orvan Falconer M.D.   On: 09/11/2023 21:47    Pending Labs Unresulted Labs (From admission, onward)     Start     Ordered   09/11/23 2137  Urine rapid drug screen (hosp performed)  Once,   STAT        09/11/23 2136   09/11/23 2137  Urinalysis, Routine w reflex microscopic -Urine, Clean Catch  Once,   URGENT       Question:  Specimen Source  Answer:  Urine, Clean Catch   09/11/23 2136            Vitals/Pain Today's Vitals   09/11/23 2236 09/11/23  2245 09/11/23 2251 09/11/23 2300  BP: (!) 151/78 (!) 156/73 (!) 156/73 (!) 153/82  Pulse: 80 86 86 82  Resp: 13 10 12 13   Temp: 98.2 F (36.8 C)  98.1 F (36.7 C)   TempSrc:      SpO2: 98% 98% 98% 99%  Weight:      Height:      PainSc:        Isolation Precautions No active  isolations  Medications Medications  tenecteplase (TNKASE) injection for Stroke 18 mg (18 mg Intravenous Given 09/11/23 2206)  iohexol (OMNIPAQUE) 350 MG/ML injection 75 mL (75 mLs Intravenous Contrast Given 09/11/23 2215)    Mobility Walks at baseline. Received TNK in ED and not ambulating.      Focused Assessments Neuro Assessment Handoff:  Swallow screen pass? Yes    NIH Stroke Scale  Dizziness Present: No Headache Present: No Interval: Other (Comment) Level of Consciousness (1a.)   : Alert, keenly responsive LOC Questions (1b. )   : Answers both questions correctly LOC Commands (1c. )   : Performs both tasks correctly Best Gaze (2. )  : Normal Visual (3. )  : No visual loss Facial Palsy (4. )    : Minor paralysis Motor Arm, Left (5a. )   : No drift Motor Arm, Right (5b. ) : No drift Motor Leg, Left (6a. )  : No drift Motor Leg, Right (6b. ) : No drift Limb Ataxia (7. ): Absent Sensory (8. )  : Normal, no sensory loss Best Language (9. )  : No aphasia Dysarthria (10. ): Normal Extinction/Inattention (11.)   : No Abnormality Complete NIHSS TOTAL: 1 Last date known well: 09/11/23 Last time known well: 2100 Neuro Assessment:   Neuro Checks:   Initial (09/11/23 2131)  Has TPA been given? Yes Temp: 98.1 F (36.7 C) (09/26 2251) Temp Source: Oral (09/26 2135) BP: 153/82 (09/26 2300) Pulse Rate: 82 (09/26 2300) If patient is a Neuro Trauma and patient is going to OR before floor call report to 4N Charge nurse: [phone] or [phone]   R Recommendations: See Admitting Provider Note  Report given to:   Additional Notes: Alert oriented and ambulatory at baseline. Presented with left arm weakness, minimal left sided droop, ataxia, and sensory deficits. Received TNK at 2206 with near immediate improvement in symptoms. No anticoagulants currently says he was stopped "a while ago".   Dialysis M, W, F.

## 2023-09-11 NOTE — ED Notes (Signed)
Patient transported to CT 

## 2023-09-11 NOTE — ED Notes (Signed)
EMS ENCODE @ 2127 CODE STROKE PAGED. STROKE CART INITIATED @ 2130

## 2023-09-11 NOTE — ED Triage Notes (Signed)
Pt bib [facility] for code stroke, called in route, paged out by ED sec at 2127, upon pt arrival, lab, Dr Rhae Hammock, and primary nurse, Riki Rusk at bedside, neuro cart activated and stroke nurse currently talking with pt, EDP and nurse. Pt cleared for CT and being transported at this time with nurse and neuro cart. Pt able to move all extremities, some weakness noted to left arm, pt has clear speech, alert and oriented x 4. Pt has very mild asymmetry to nasolabial fold on left side of face.

## 2023-09-11 NOTE — Consult Note (Addendum)
[facility] Tele[facility]urology Consult Services   Patient Name:   Jacob Farmer, Jacob Farmer Date of Birth:   04-Mar-1946 Identification Number:   MRN - [id number] Date of Service:   09/11/2023 21:38:12  Diagnosis:       I63.89 - Cerebrovascular accident (CVA) due to other mechanism [facility])  Impression:      The patient presents with L sided weakness and numbness (arm more than leg), facial droop. Acute ischemic stroke is suspected. CTH shows no hemorrhage, aspects 10. NIHSS is 4, patient had improvement since onset but said his leg was also previously weak. He has a h/o afib, ESRD on dialysis, and also GI bleed while he was previously on eliquis. That occurred in May and technically he has been free of GI bleed >3 weeks. We did discuss this in detail in regards to risks and benefits. Given the patient's current disabling deficits, we recommended the use of IV thrombolytic and had detailed discussion with the patient regarding risks including bleeding, allergic reaction,risk of needing surgery, blood transfusions, or loss of limb function, and other unforeseen adverse effects. Contraindications were reviewed in detail and patient met criteria for thrombolytic. We reviewed that the risk of bleeding is estimated at 3-6% and could even be fatal. The patient was in agreement with receiving the medication. A timeout was performed and the patient received the TNK injection without complication. Stat CTA is ordered to rule out LVO to assess for eligibility for thrombectomy. Otherwise, will admit for post TNK stroke care and further workup.  ADDENDUM:  Advanced Imaging: CTA Head and Neck Completed.  LVO:No  Patient is not eligible for NIR consideration. Proceed with plan as discussed.   Our recommendations are outlined below. Recommendations: IV Tenecteplase recommended.  I confirmed the following. (Patient name, DOB, MRN, Blood Pressure, dose of Thrombolytic and waste, weight completed by  stretcher/scale not stated weight, have ED staff inform ED MD of thrombolytic decision) Thrombolytic bolus given Without Complication.   IV Tenecteplase Total Dose - 17.6 mg   Routine post Thrombolytic monitoring including neuro checks and blood pressure control during/after treatment Monitor blood pressure Check blood pressure and neuro assessment every 15 min for 2 h, then every 30 min for 6 h, and finally every hour for 16 h.  Manage Blood Pressure per post Thrombolytic protocol.        Follow designated hospital protocol for admission and post thrombolytic care       CT brain 24 hours post Thrombolytic       NPO until swallowing screen performed and passed       No antiplatelet agents or anticoagulants (including heparin for DVT prophylaxis) in first 24 hours       No Foley catheter, nasogastric tube, arterial catheter or central venous catheter for 24 hr, unless absolutely necessary       Telemetry       Bedside swallow evaluation       HOB less than 30 degrees       Euglycemia       Avoid hyperthermia, PRN acetaminophen       DVT prophylaxis       Inpatient Neurology Consultation       Stroke evaluation as per inpatient neurology recommendations  Discussed with ED physician    ------------------------------------------------------------------------------  Advanced Imaging: Advanced imaging has been ordered. Results pending. ADDENDUM:  Advanced Imaging: CTA Head and Neck Completed.  LVO:No  Patient is not eligible for NIR consideration. Proceed with plan as discussed.  Metrics: Last  Known Well: 09/11/2023 20:00:00 [facility] Notification Time: 09/11/2023 21:38:12 Arrival Time: 09/11/2023 21:30:00 Stamp Time: 09/11/2023 21:38:12 Initial Response Time: 09/11/2023 21:42:24 Symptoms: L arm weakness. Initial patient interaction: 09/11/2023 21:43:26 NIHSS Assessment Completed: 09/11/2023 21:47:46 Patient is a candidate for Thrombolytic. Thrombolytic Medical  Decision: 09/11/2023 22:01:18 Needle Time: 09/11/2023 22:06:30 Weight Noted by Staff: 70.3 kg  CT head showed no acute hemorrhage or acute core infarct.  Primary Provider Notified of Diagnostic Impression and Management Plan on: 09/11/2023 22:11:31    ------------------------------------------------------------------------------  Thrombolytic Contraindications:  Last Known Well > 4.5 hours: No CT Head showing hemorrhage: No Ischemic stroke within 3 months: No Severe head trauma within 3 months: No Intracranial/intraspinal surgery within 3 months: No History of intracranial hemorrhage: No Symptoms and signs consistent with an SAH: No GI malignancy or GI bleed within 21 days: No Coagulopathy: Platelets <100 000 /mm3, INR >1.7, aPTT>40 s, or PT >15 s: No Treatment dose of LMWH within the previous 24 hrs: No Use of NOACs in past 48 hours: No Glycoprotein IIb/IIIa receptor inhibitors use: No Symptoms consistent with infective endocarditis: No Suspected aortic arch dissection: No Intra-axial intracranial neoplasm: No  Thrombolytic Decision and Management Plan: Management with thrombolytic treatment was explained to the Patient and Family as was risks and benefits and alternatives to the treatment. Patient agrees with the decision to proceed with thrombolytic treatment. . All questions were answered and the Patient and Family expressed understanding of the treatment plan.   History of Present Illness: Patient is a 77 year old Male.  Patient was brought by EMS for symptoms of L arm weakness. L arm was working well when he was going to bed. As he laid down at 8pm to sleep, He suddenly experienced L side numbness, L arm weakness and facial droop. He has h/o afib but had He had 2 bleeding ulcers (last was in May), and had no further detected afib so they discontinued eliquis. Initially his left leg he couldn't work. He could barely move his L arm but it has improved. Leg is working well  now but still numb. L arm is moving better but L wrist is still a little clumsy. He is R handed.   Past Medical History:      Hypertension      Hyperlipidemia      Atrial Fibrillation      Coronary Artery Disease Other PMH:  ESRD  CAD  gastric ulcer  h/o MI  psoriatic arthritis  sinus bradycardia  prostate CA  prediabetes    Medications:  No Anticoagulant use  No Antiplatelet use Reviewed EMR for current medications  Allergies:  Reviewed  Social History: Smoking: Former  Family History:  There is no family history of premature cerebrovascular disease pertinent to this consultation  ROS : 14 Points Review of Systems was performed and was negative except mentioned in HPI.  Past Surgical History: There Is No Surgical History Contributory To Today's Visit   Examination: BP(166/77), Pulse(85), Blood Glucose(142) 1A: Level of Consciousness - Alert; keenly responsive + 0 1B: Ask Month and Age - Both Questions Right + 0 1C: Blink Eyes & Squeeze Hands - Performs Both Tasks + 0 2: Test Horizontal Extraocular Movements - Normal + 0 3: Test Visual Fields - No Visual Loss + 0 4: Test Facial Palsy (Use Grimace if Obtunded) - Partial paralysis (lower face) + 2 5A: Test Left Arm Motor Drift - Drift, but doesn't hit bed + 1 5B: Test Right Arm Motor Drift - No Drift for  10 Seconds + 0 6A: Test Left Leg Motor Drift - No Drift for 5 Seconds + 0 6B: Test Right Leg Motor Drift - No Drift for 5 Seconds + 0 7: Test Limb Ataxia (FNF/Heel-Shin) - No Ataxia + 0 8: Test Sensation - Mild-Moderate Loss: Less Sharp/More Dull + 1 9: Test Language/Aphasia - Normal; No aphasia + 0 10: Test Dysarthria - Normal + 0 11: Test Extinction/Inattention - No abnormality + 0  NIHSS Score: 4  Pre-Morbid Modified Rankin Scale: 0 Points = No symptoms at all  Spoke with : Dr Rhae Hammock  This consult was conducted in real time using interactive audio and Immunologist. Patient was informed of the  technology being used for this visit and agreed to proceed. Patient located in hospital and provider located at home/office setting.   Patient is being evaluated for possible acute neurologic impairment and high probability of imminent or life-threatening deterioration. I spent total of 35 minutes providing care to this patient, including time for face to face visit via telemedicine, review of medical records, imaging studies and discussion of findings with providers, the patient and/or family.   Dr Lynnda Child   [facility] For Inpatient follow-up with [facility] physician please call [facility] [phone]. This is not an outpatient service. Post hospital discharge, please contact hospital directly.  Please do not communicate with [facility] physicians via secure chat. If you have any questions, Please contact [facility]. Please call or reconsult our service if there are any clinical or diagnostic changes.

## 2023-09-12 ENCOUNTER — Inpatient Hospital Stay (HOSPITAL_COMMUNITY): Payer: Medicare HMO

## 2023-09-12 ENCOUNTER — Telehealth: Payer: Self-pay

## 2023-09-12 DIAGNOSIS — I639 Cerebral infarction, unspecified: Secondary | ICD-10-CM | POA: Diagnosis present

## 2023-09-12 DIAGNOSIS — I6389 Other cerebral infarction: Secondary | ICD-10-CM | POA: Diagnosis not present

## 2023-09-12 LAB — LIPID PANEL
Cholesterol: 123 mg/dL (ref 0–200)
HDL: 64 mg/dL (ref >40)
LDL Cholesterol: 41 mg/dL (ref 0–99)
Total CHOL/HDL Ratio: 1.9 {ratio}
Triglycerides: 92 mg/dL (ref <150)
VLDL: 18 mg/dL (ref 0–40)

## 2023-09-12 LAB — URINALYSIS, ROUTINE W REFLEX MICROSCOPIC
Bilirubin Urine: NEGATIVE
Glucose, UA: 50 mg/dL — AB
Ketones, ur: NEGATIVE mg/dL
Leukocytes,Ua: NEGATIVE
Nitrite: NEGATIVE
Protein, ur: 30 mg/dL — AB
Specific Gravity, Urine: 1.01 (ref 1.005–1.030)
pH: 7 (ref 5.0–8.0)

## 2023-09-12 LAB — RAPID URINE DRUG SCREEN, HOSP PERFORMED
Amphetamines: NOT DETECTED
Barbiturates: NOT DETECTED
Benzodiazepines: NOT DETECTED
Cocaine: NOT DETECTED
Opiates: NOT DETECTED
Tetrahydrocannabinol: NOT DETECTED

## 2023-09-12 LAB — ECHOCARDIOGRAM COMPLETE
AR max vel: 2.58 cm2
AV Peak grad: 4.6 mm[Hg]
Ao pk vel: 1.07 m/s
Area-P 1/2: 3.31 cm2
Height: 70 in
S' Lateral: 3.4 cm
Weight: 2480 [oz_av]

## 2023-09-12 LAB — GLUCOSE, CAPILLARY: Glucose-Capillary: 116 mg/dL — ABNORMAL HIGH (ref 70–99)

## 2023-09-12 LAB — HEPATITIS B SURFACE ANTIGEN: Hepatitis B Surface Ag: NONREACTIVE

## 2023-09-12 LAB — MRSA NEXT GEN BY PCR, NASAL: MRSA by PCR Next Gen: NOT DETECTED

## 2023-09-12 MED ORDER — SEVELAMER CARBONATE 800 MG PO TABS
800.0000 mg | ORAL_TABLET | Freq: Two times a day (BID) | ORAL | Status: DC
  Administered 2023-09-12 – 2023-09-16 (×7): 800 mg via ORAL
  Filled 2023-09-12 (×7): qty 1

## 2023-09-12 MED ORDER — FOLIC ACID 1 MG PO TABS
0.5000 mg | ORAL_TABLET | Freq: Every day | ORAL | Status: DC
  Administered 2023-09-12 – 2023-09-15 (×4): 0.5 mg via ORAL
  Filled 2023-09-12 (×4): qty 1

## 2023-09-12 MED ORDER — SENNOSIDES-DOCUSATE SODIUM 8.6-50 MG PO TABS: 1.0000 | ORAL_TABLET | Freq: Every evening | ORAL | Status: DC | PRN

## 2023-09-12 MED ORDER — STROKE: EARLY STAGES OF RECOVERY BOOK
Freq: Once | Status: AC
  Filled 2023-09-12: qty 1

## 2023-09-12 MED ORDER — CALCITRIOL 0.25 MCG PO CAPS
0.2500 ug | ORAL_CAPSULE | ORAL | Status: DC
  Administered 2023-09-12 – 2023-09-15 (×2): 0.25 ug via ORAL
  Filled 2023-09-12 (×2): qty 1

## 2023-09-12 MED ORDER — HYDRALAZINE HCL 25 MG PO TABS
25.0000 mg | ORAL_TABLET | Freq: Two times a day (BID) | ORAL | Status: DC
  Administered 2023-09-12 – 2023-09-13 (×3): 25 mg via ORAL
  Filled 2023-09-12 (×3): qty 1

## 2023-09-12 MED ORDER — ACETAMINOPHEN 160 MG/5ML PO SOLN: 650.0000 mg | ORAL | Status: DC | PRN

## 2023-09-12 MED ORDER — CHLORHEXIDINE GLUCONATE CLOTH 2 % EX PADS
6.0000 | MEDICATED_PAD | Freq: Every day | CUTANEOUS | Status: DC
  Administered 2023-09-12: 6 via TOPICAL

## 2023-09-12 MED ORDER — PANTOPRAZOLE SODIUM 40 MG IV SOLR: 40.0000 mg | Freq: Every day | INTRAVENOUS | Status: DC

## 2023-09-12 MED ORDER — PANTOPRAZOLE SODIUM 40 MG PO TBEC
40.0000 mg | DELAYED_RELEASE_TABLET | Freq: Two times a day (BID) | ORAL | Status: DC
  Administered 2023-09-12 – 2023-09-15 (×8): 40 mg via ORAL
  Filled 2023-09-12 (×8): qty 1

## 2023-09-12 MED ORDER — LABETALOL HCL 5 MG/ML IV SOLN
10.0000 mg | INTRAVENOUS | Status: DC | PRN
  Administered 2023-09-12 – 2023-09-13 (×2): 10 mg via INTRAVENOUS
  Filled 2023-09-12 (×4): qty 4

## 2023-09-12 MED ORDER — HYDRALAZINE HCL 20 MG/ML IJ SOLN
10.0000 mg | INTRAMUSCULAR | Status: DC | PRN
  Administered 2023-09-12 – 2023-09-13 (×4): 10 mg via INTRAVENOUS
  Filled 2023-09-12 (×5): qty 1

## 2023-09-12 MED ORDER — ACETAMINOPHEN 650 MG RE SUPP: 650.0000 mg | RECTAL | Status: DC | PRN

## 2023-09-12 MED ORDER — MUSCLE RUB 10-15 % EX CREA
TOPICAL_CREAM | CUTANEOUS | Status: DC | PRN
  Filled 2023-09-12: qty 85

## 2023-09-12 MED ORDER — LEFLUNOMIDE 10 MG PO TABS
10.0000 mg | ORAL_TABLET | Freq: Every day | ORAL | Status: DC
  Administered 2023-09-12 – 2023-09-16 (×5): 10 mg via ORAL
  Filled 2023-09-12 (×5): qty 1

## 2023-09-12 MED ORDER — SODIUM CHLORIDE 0.9 % IV SOLN: INTRAVENOUS | Status: DC

## 2023-09-12 MED ORDER — ACETAMINOPHEN 325 MG PO TABS
650.0000 mg | ORAL_TABLET | ORAL | Status: DC | PRN
  Administered 2023-09-12 – 2023-09-15 (×4): 650 mg via ORAL
  Filled 2023-09-12 (×4): qty 2

## 2023-09-12 MED ORDER — ROSUVASTATIN CALCIUM 20 MG PO TABS
40.0000 mg | ORAL_TABLET | Freq: Every day | ORAL | Status: DC
  Administered 2023-09-12 – 2023-09-15 (×4): 40 mg via ORAL
  Filled 2023-09-12 (×4): qty 2

## 2023-09-12 MED ORDER — POTASSIUM CHLORIDE 10 MEQ/100ML IV SOLN
10.0000 meq | INTRAVENOUS | Status: AC
  Administered 2023-09-12 (×3): 10 meq via INTRAVENOUS
  Filled 2023-09-12 (×3): qty 100

## 2023-09-12 MED ORDER — AMLODIPINE BESYLATE 10 MG PO TABS
10.0000 mg | ORAL_TABLET | Freq: Every day | ORAL | Status: DC
  Administered 2023-09-12 – 2023-09-16 (×4): 10 mg via ORAL
  Filled 2023-09-12 (×4): qty 1

## 2023-09-12 MED ORDER — LORAZEPAM 2 MG/ML IJ SOLN
1.0000 mg | INTRAMUSCULAR | Status: DC | PRN
  Administered 2023-09-12: 1 mg via INTRAVENOUS
  Filled 2023-09-12 (×2): qty 1

## 2023-09-12 MED ORDER — ALLOPURINOL 100 MG PO TABS
100.0000 mg | ORAL_TABLET | Freq: Every day | ORAL | Status: DC
  Administered 2023-09-12 – 2023-09-15 (×4): 100 mg via ORAL
  Filled 2023-09-12 (×5): qty 1

## 2023-09-12 MED ORDER — CHLORHEXIDINE GLUCONATE CLOTH 2 % EX PADS: 6.0000 | MEDICATED_PAD | Freq: Every day | CUTANEOUS | Status: DC

## 2023-09-12 MED ORDER — SODIUM CHLORIDE 0.9 % IV SOLN
4.0000 g | Freq: Once | INTRAVENOUS | Status: AC
  Administered 2023-09-12: 4 g via INTRAVENOUS
  Filled 2023-09-12: qty 40

## 2023-09-12 MED ORDER — LABETALOL HCL 5 MG/ML IV SOLN
20.0000 mg | Freq: Once | INTRAVENOUS | Status: AC
  Administered 2023-09-12: 20 mg via INTRAVENOUS
  Filled 2023-09-12: qty 4

## 2023-09-12 MED ORDER — CLEVIDIPINE BUTYRATE 0.5 MG/ML IV EMUL
0.0000 mg/h | INTRAVENOUS | Status: DC
  Administered 2023-09-12 (×2): 2 mg/h via INTRAVENOUS
  Filled 2023-09-12: qty 100

## 2023-09-12 NOTE — Progress Notes (Addendum)
STROKE TEAM PROGRESS NOTE   BRIEF HPI [name] is a 77 y.o. male with history of MI, hypertension, hyperlipidemia, former smoker, end-stage renal disease on hemodialysis, atrial fibrillation not on anticoagulation due to bleeding ulcers presenting with sudden onset of left arm weakness and numbness as well as left facial droop.  Patient was given TNK to treat his stroke and has experienced improvement in symptoms since then.  He is awaiting MRI and will need dialysis resumed while he is here.  He is on a Monday Wednesday Friday schedule.   SIGNIFICANT HOSPITAL EVENTS 9/27 patient admitted and TNK given  INTERIM HISTORY/SUBJECTIVE Patient has been hemodynamically stable overnight, and his neurological exam is improved from admission.  He states that his symptoms have nearly resolved. Vital signs stable.  Blood pressure adequately controlled.  Post TNK brain imaging is pending  OBJECTIVE  CBC    Component Value Date/Time   WBC 6.1 09/11/2023 2133   RBC 3.73 (L) 09/11/2023 2133   HGB 11.2 (L) 09/11/2023 2136   HGB 12.1 (L) 08/21/2023 1321   HCT 33.0 (L) 09/11/2023 2136   HCT 38.8 08/21/2023 1321   PLT 162 09/11/2023 2133   PLT 198 08/21/2023 1321   MCV 89.5 09/11/2023 2133   MCV 91 08/21/2023 1321   MCH 29.2 09/11/2023 2133   MCHC 32.6 09/11/2023 2133   RDW 14.5 09/11/2023 2133   RDW 13.1 08/21/2023 1321   LYMPHSABS 1.1 09/11/2023 2133   LYMPHSABS 1.4 08/21/2023 1321   MONOABS 0.6 09/11/2023 2133   EOSABS 0.3 09/11/2023 2133   EOSABS 0.3 08/21/2023 1321   BASOSABS 0.0 09/11/2023 2133   BASOSABS 0.0 08/21/2023 1321    BMET    Component Value Date/Time   NA 132 (L) 09/11/2023 2136   NA 138 08/21/2023 1321   K 3.1 (L) 09/11/2023 2136   CL 94 (L) 09/11/2023 2136   CO2 29 09/11/2023 2133   GLUCOSE 156 (H) 09/11/2023 2136   BUN 26 (H) 09/11/2023 2136   BUN 20 08/21/2023 1321   CREATININE 4.70 (H) 09/11/2023 2136   CREATININE 4.66 (H) 03/28/2023 1340   CALCIUM  8.6 (L) 09/11/2023 2133   EGFR 17 (L) 08/21/2023 1321   GFRNONAA 14 (L) 09/11/2023 2133   GFRNONAA 23 (L) 06/14/2021 1322    IMAGING past 24 hours CT ANGIO HEAD NECK W WO CM (CODE STROKE)  Result Date: 09/11/2023 CLINICAL DATA:  Initial evaluation for neuro deficit, stroke suspected, left upper extremity weakness. EXAM: CT ANGIOGRAPHY HEAD AND NECK WITH AND WITHOUT CONTRAST TECHNIQUE: Multidetector CT imaging of the head and neck was performed using the standard protocol during bolus administration of intravenous contrast. Multiplanar CT image reconstructions and MIPs were obtained to evaluate the vascular anatomy. Carotid stenosis measurements (when applicable) are obtained utilizing NASCET criteria, using the distal internal carotid diameter as the denominator. RADIATION DOSE REDUCTION: This exam was performed according to the departmental dose-optimization program which includes automated exposure control, adjustment of the mA and/or kV according to patient size and/or use of iterative reconstruction technique. CONTRAST:  75mL OMNIPAQUE IOHEXOL 350 MG/ML SOLN COMPARISON:  CT from earlier the same day. FINDINGS: CTA NECK FINDINGS Aortic arch: Visualized aortic arch within normal limits for caliber. Severe atheromatous change about the arch and distal descending intrathoracic aorta with markedly irregular soft plaque about the aortic lumen. Superimposed multifocal penetrating plaques noted no high-grade stenosis about the origin the great vessels. Right carotid system: Right common and internal carotid arteries are patent without  dissection. Atheromatous plaque about the right carotid bulb without hemodynamically significant greater than 50% stenosis. Left carotid system: Left common and internal carotid arteries are patent without dissection. Atheromatous plaque about the left carotid bulb/proximal left ICA with associated stenosis of up to approximate 50% by NASCET criteria. Vertebral arteries: Both  vertebral arteries arise from subclavian arteries. No significant proximal subclavian artery stenosis. 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 Vertebral arteries are otherwise patent without stenosis or dissection. Skeleton: No worrisome osseous lesions. Moderate to advanced multilevel cervical spondylosis. Patient is edentulous. Other neck: No other acute finding. 2.3 cm sebaceous cyst noted within the lateral left neck. Right-sided central venous catheter in place. Upper chest: No other acute finding. Review of the MIP images confirms the above findings CTA HEAD FINDINGS Anterior circulation: Atheromatous change about the carotid siphons with associated moderate to severe stenoses at the para clinoid segments bilaterally. A1 segments patent bilaterally. Normal anterior communicating artery complex. Anterior cerebral arteries patent without significant stenosis. No M1 stenosis or occlusion. No proximal MCA branch occlusion or high-grade stenosis. Distal MCA branches perfused and symmetric. Posterior circulation: Dominant right V4 segment patent without significant stenosis. Focal severe stenosis seen involving the mid-distal left V4 segment (series 6, image 282). Left PICA patent at its origin. Right PICA not well seen. Basilar patent without stenosis. Superior cerebellar and posterior cerebral arteries patent bilaterally. Venous sinuses: Patent allowing for timing the contrast bolus. Anatomic variants: None significant.  No aneurysm. Review of the MIP images confirms the above findings IMPRESSION: 1. Negative CTA for large vessel occlusion or other emergent finding. 2. Atheromatous plaque about the left carotid bulb/proximal left ICA with associated stenosis of up to approximate 50% by NASCET criteria. 3. 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 4. Severe atheromatous stenosis involving the mid-distal left V4 segment. 5. Atheromatous change about  the carotid siphons with associated moderate to severe stenoses at the para clinoid segments bilaterally. 6. Severe atheromatous change about the aortic arch an proximal descending intrathoracic aorta with associated multifocal intimal irregularity and penetrating plaques. Aortic Atherosclerosis (ICD10-I70.0). Electronically Signed   By: Rise Mu M.D.   On: 09/11/2023 22:50   CT HEAD CODE STROKE WO CONTRAST  Result Date: 09/11/2023 CLINICAL DATA:  Code stroke. Neuro deficit, acute, stroke suspected. Left-sided weakness. EXAM: CT HEAD WITHOUT CONTRAST TECHNIQUE: Contiguous axial images were obtained from the base of the skull through the vertex without intravenous contrast. RADIATION DOSE REDUCTION: This exam was performed according to the departmental dose-optimization program which includes automated exposure control, adjustment of the mA and/or kV according to patient size and/or use of iterative reconstruction technique. COMPARISON:  None Available. FINDINGS: Brain: No acute intracranial hemorrhage. Gray-white differentiation is preserved. No hydrocephalus or extra-axial collection. No mass effect or midline shift. Vascular: No hyperdense vessel or unexpected calcification. Skull: No calvarial fracture or suspicious bone lesion. Skull base is unremarkable. Sinuses/Orbits: No acute finding. Other: None. ASPECTS ([place] Stroke Program Early CT Score) - Ganglionic level infarction (caudate, lentiform nuclei, internal capsule, insula, M1-M3 cortex): 7 - Supraganglionic infarction (M4-M6 cortex): 3 Total score (0-10 with 10 being normal): 10 IMPRESSION: No acute intracranial hemorrhage or evidence of acute large vessel territory infarct. ASPECT score is 10. Code stroke imaging results were communicated on 09/11/2023 at 9:47 pm to provider Dr. Rhae Hammock via telephone, who verbally acknowledged these results. Electronically Signed   By: Orvan Falconer M.D.   On: 09/11/2023 21:47    Vitals:  09/12/23 0900  09/12/23 0906 09/12/23 1000 09/12/23 1116  BP: (!) 165/77 (!) 165/77 (!) 175/107   Pulse: 67  67   Resp: 12  17   Temp:    98.1 F (36.7 C)  TempSrc:    Oral  SpO2: 100%  100%   Weight:      Height:         PHYSICAL EXAM General:  Alert, well-nourished, well-developed patient in no acute distress Psych:  Mood and affect appropriate for situation CV: Regular rate and rhythm on monitor Respiratory:  Regular, unlabored respirations on room air GI: Abdomen soft and nontender   NEURO:  Mental Status: AA&Ox3, patient is able to give clear and coherent history Speech/Language: speech is without dysarthria or aphasia.    Cranial Nerves:  II: PERRL. Visual fields full.  III, IV, VI: EOMI. Eyelids elevate symmetrically.  V: Sensation is intact to light touch and symmetrical to face.  VII: Slight left facial droop VIII: hearing intact to voice. IX, X: Phonation is normal.  XII: tongue is midline without fasciculations. Motor: 5/5 strength to all muscle groups tested.  Slight grip weakness on the left and diminished fine finger movements of the left hand, right hand orbits left Tone: is normal and bulk is normal Sensation- Intact to light touch bilaterally. Extinction absent to light touch to DSS.   Coordination: FTN intact bilaterally.  No drift.  Gait- deferred   ASSESSMENT/PLAN  Right hemispheric subcortical Acute Ischemic Infarct:  right  s/p TNK Etiology: Cardioembolic in the setting of atrial fibrillation off anticoagulation versus small vessel disease Code Stroke CT head No acute abnormality. ASPECTS 10.    CTA head & neck no LVO, left ICA with 50% stenosis, severe stenosis at origin of right vertebral artery, severe stenosis in mid distal left V4 segment MRI pending 2D Echo pending LDL 41 HgbA1c 5.3 VTE prophylaxis -SCDs No antithrombotic prior to admission, now on No antithrombotic as he is less than 24 hours from TNK administration Therapy recommendations:  Pending Disposition: Pending  Atrial fibrillation Home Meds: No anticoagulation due to history of bleeding ulcers and severe GI bleed Continue telemetry monitoring Consulted cardiology for consideration of Watchman procedure  Hypertension Home meds: Amlodipine 10 mg daily, hydralazine 25 mg twice daily Stable Blood Pressure Goal: BP less than 180/105   Hyperlipidemia Home meds: Rosuvastatin 40 mg daily, resumed in hospital LDL 41, goal < 70 Continue statin at discharge  End-stage renal disease on dialysis Patient has been on dialysis for about 3 months, has Vas-Cath On Monday Wednesday Friday hemodialysis schedule Nephrology consulted, appreciate recommendations  Other Stroke Risk Factors None   Other Active Problems None  Hospital day # 1  Patient seen by NP and then by MD, MD to edit note as needed. Cortney E Ernestina Columbia , MSN, AGACNP-BC [facility] See Amion for schedule and pager information 09/12/2023 12:45 PM  I have personally obtained history,examined this patient, reviewed notes, independently viewed imaging studies, participated in medical decision making and plan of care.ROS completed by me personally and pertinent positives fully documented  I have made any additions or clarifications directly to the above note. Agree with note above.  Patient presented with sudden onset of left hemiparesis and received IV TNK and has made near complete improvement.  Continue close neurological observation and strict blood pressure control as per post TNK protocol.  Mobilize out of bed.  Therapy consults.  Check MRI scan, echocardiogram.  Aggressive risk factor modification.  Nephrology  consult for dialysis.  Patient was scheduled to undergo AV fistula surgery next week but this will have to be postponed now. This patient is critically ill and at significant risk of neurological worsening, death and care requires constant monitoring of vital signs, hemodynamics,respiratory  and cardiac monitoring, extensive review of multiple databases, frequent neurological assessment, discussion with family, other specialists and medical decision making of high complexity.I have made any additions or clarifications directly to the above note.This critical care time does not reflect procedure time, or teaching time or supervisory time of PA/NP/Med Resident etc but could involve care discussion time.  I spent 30 minutes of neurocritical care time  in the care of  this patient.     Delia Heady, MD Medical Director [facility] Pager: [phone] 09/12/2023 2:24 PM   To contact Stroke Continuity provider, please refer to [email or web address]. After hours, contact General Neurology

## 2023-09-12 NOTE — Consult Note (Addendum)
[facility] Nephrology Consult Note: Reason for Consult: To manage dialysis and dialysis related needs Referring Physician: Dr. Pearlean Brownie (stroke team)  HPI:  Jacob Farmer is an 77 y.o. male with past medical history significant for hypertension, HLD, CAD, Psoriatec arthritis, A-fib not on anticoagulation because of bleeding ulcer, ESRD on HD who was presented with left arm weakness, numbness and left facial droop due to a stroke.  Patient was treated with tPA and admitted in ICU. For ESRD, patient reports getting dialysis MWF at [facility] in [place].  He reports his last dialysis was Wednesday which was a uneventful.  He has [facility] the access.  He is supposed to have vascular permanent access placement sometime soon which is now postponed because of his stroke.  He follows with Dr. Wolfgang Phoenix as his nephrologist. The blood pressure is elevated and currently he is on clevidipine drip.  He denies nausea, vomiting, chest pain or shortness of breath.  No peripheral edema.  His wife was presented with him. The labs showed sodium 132, potassium 3.1, hemoglobin 11.2  Past Medical History:  Diagnosis Date   CAD (coronary artery disease)    Acute myocardial infarction treated with TPA in 1990; 1999-stents to circumflex and RCA; residual total occlusion of the left anterior descending; normal ejection fraction; stress nuclear in 2001-distal anteroseptal ischemia; normal LV function   CKD (chronic kidney disease) stage 4, GFR 15-29 ml/min (HCC)    First degree heart block    History of gastric ulcer    History of kidney stones    History of MI (myocardial infarction)    1990-  treated w/ TPA   Hyperlipidemia    Hypertension    Jaundice    OA (osteoarthritis)    Prediabetes    Prostate cancer (HCC)    Stage T1c , Gleason 3+4,  PSA 4.7,  vol 24cc--  scheduled for radiative seed implants   Psoriatic arthritis (HCC)    RA (rheumatoid arthritis) (HCC)    Dr.  Beatrix Fetters   Sinus bradycardia    Wears dentures     Past Surgical History:  Procedure Laterality Date   BIOPSY  08/27/2019   Procedure: BIOPSY;  Surgeon: Malissa Hippo, MD;  [facility];  Service: Endoscopy;;  gastric   BIOPSY  04/03/2022   Procedure: BIOPSY;  Surgeon: Dolores Frame, MD;  [facility];  Service: Gastroenterology;;   BIOPSY  04/21/2023   Procedure: BIOPSY;  Surgeon: Lanelle Bal, DO;  [facility];  Service: Endoscopy;;   callus removal Left 09/13/2022   left foot   CARDIOVASCULAR STRESS TEST  2001  per Dr Dietrich Pates clinic note   distal anteroseptal ischemia,  normal LVF   COLONOSCOPY  last one 2006 (approx)   COLONOSCOPY WITH PROPOFOL N/A 04/03/2022   Procedure: COLONOSCOPY WITH PROPOFOL;  Surgeon: Dolores Frame, MD;  [facility];  Service: Gastroenterology;  Laterality: N/A;  945   CORONARY ANGIOPLASTY WITH STENT PLACEMENT  1999   DES x2  to CFX and RCA/  residual total occlusion LAD,  normal LVEF   ENTEROSCOPY N/A 04/24/2023   Procedure: ENTEROSCOPY;  Surgeon: Dolores Frame, MD;  [facility];  Service: Gastroenterology;  Laterality: N/A;   ESOPHAGOGASTRODUODENOSCOPY (EGD) WITH PROPOFOL N/A 08/27/2019   Procedure: ESOPHAGOGASTRODUODENOSCOPY (EGD) WITH PROPOFOL;  Surgeon: Malissa Hippo, MD;  [facility];  Service: Endoscopy;  Laterality: N/A;  10:10  ESOPHAGOGASTRODUODENOSCOPY (EGD) WITH PROPOFOL N/A 04/03/2022   Procedure: ESOPHAGOGASTRODUODENOSCOPY (EGD) WITH PROPOFOL;  Surgeon: Dolores Frame, MD;  [facility];  Service: Gastroenterology;  Laterality: N/A;   ESOPHAGOGASTRODUODENOSCOPY (EGD) WITH PROPOFOL N/A 02/16/2023   Procedure: ESOPHAGOGASTRODUODENOSCOPY (EGD) WITH PROPOFOL;  Surgeon: Dolores Frame, MD;  [facility];  Service: Gastroenterology;  Laterality: N/A;   ESOPHAGOGASTRODUODENOSCOPY (EGD) WITH  PROPOFOL N/A 04/21/2023   Procedure: ESOPHAGOGASTRODUODENOSCOPY (EGD) WITH PROPOFOL;  Surgeon: Lanelle Bal, DO;  [facility];  Service: Endoscopy;  Laterality: N/A;   EYE SURGERY Bilateral    cataract   GIVENS CAPSULE STUDY N/A 04/22/2023   Procedure: GIVENS CAPSULE STUDY;  Surgeon: Dolores Frame, MD;  [facility];  Service: Gastroenterology;  Laterality: N/A;   HEMOSTASIS CLIP PLACEMENT  04/24/2023   Procedure: HEMOSTASIS CLIP PLACEMENT;  Surgeon: Dolores Frame, MD;  [facility];  Service: Gastroenterology;;   HOT HEMOSTASIS  04/03/2022   Procedure: HOT HEMOSTASIS (ARGON PLASMA COAGULATION/BICAP);  Surgeon: Marguerita Merles, Reuel Boom, MD;  [facility];  Service: Gastroenterology;;   HOT HEMOSTASIS  04/24/2023   Procedure: HOT HEMOSTASIS (ARGON PLASMA COAGULATION/BICAP);  Surgeon: Marguerita Merles, Reuel Boom, MD;  [facility];  Service: Gastroenterology;;   IR FLUORO GUIDE CV LINE RIGHT  05/30/2023   IR US GUIDE VASC ACCESS RIGHT  05/30/2023   POLYPECTOMY  04/03/2022   Procedure: POLYPECTOMY;  Surgeon: Dolores Frame, MD;  [facility];  Service: Gastroenterology;;   POSTERIOR CERVICAL FUSION/FORAMINOTOMY N/A 05/26/2017   Procedure: RIGHT C4-5 FORAMINOTOMY WITH EXCISION OF HERNIATED DISC;  Surgeon: Kerrin Champagne, MD;  Location: [facility] OR;  Service: Orthopedics;  Laterality: N/A;   RADIOACTIVE SEED IMPLANT N/A 01/11/2016   Procedure: RADIOACTIVE SEED IMPLANT/BRACHYTHERAPY IMPLANT;  Surgeon: Marcine Matar, MD;  Location: [facility];  Service: Urology;  Laterality: N/A;   73  seeds implanted no seeds founds in bladder   SCLEROTHERAPY  04/24/2023   Procedure: SCLEROTHERAPY;  Surgeon: Marguerita Merles, Reuel Boom, MD;  [facility];  Service: Gastroenterology;;   SUBMUCOSAL TATTOO INJECTION  04/24/2023   Procedure: SUBMUCOSAL TATTOO INJECTION;  Surgeon: Marguerita Merles, Reuel Boom,  MD;  [facility];  Service: Gastroenterology;;    Family History  Problem Relation Age of Onset   Heart attack Mother    Coronary artery disease Father    Ulcerative colitis Father    Ulcers Father    Breast cancer Sister    COPD Brother    Pancreatic cancer Brother    Healthy Sister    Diabetes Brother    Cirrhosis Son    Seizures Son     Social History:  reports that he quit smoking about 34 years ago. His smoking use included cigarettes. He started smoking about 64 years ago. He has a 30 pack-year smoking history. He has never been exposed to tobacco smoke. He has never used smokeless tobacco. He reports current alcohol use. He reports that he does not use drugs.  Allergies: No Known Allergies  Medications: I have reviewed the patient's current medications.   Results for orders placed or performed during the hospital encounter of 09/11/23 (from the past 48 hour(s))  CBG monitoring, ED     Status: Abnormal   Collection Time: 09/11/23  9:32 PM  Result Value Ref Range   Glucose-Capillary 142 (H) 70 - 99 mg/dL    Comment: Glucose reference range applies only to samples taken after fasting for  at least 8 hours.  Ethanol     Status: None   Collection Time: 09/11/23  9:33 PM  Result Value Ref Range   Alcohol, Ethyl (B) <10 <10 mg/dL    Comment: (NOTE) Lowest detectable limit for serum alcohol is 10 mg/dL.  For medical purposes only. Performed at [facility], [address] [place], [place] [address]   Protime-INR     Status: None   Collection Time: 09/11/23  9:33 PM  Result Value Ref Range   Prothrombin Time 12.3 11.4 - 15.2 seconds   INR 0.9 0.8 - 1.2    Comment: (NOTE) INR goal varies based on device and disease states. Performed at [facility], [address] [place], [place] [address]   APTT     Status: None   Collection Time: 09/11/23  9:33 PM  Result Value Ref Range   aPTT 24 24 - 36 seconds    Comment: Performed at [facility], [address] [place], [place] [address]  CBC     Status: Abnormal   Collection Time: 09/11/23  9:33 PM  Result Value Ref Range   WBC 6.1 4.0 - 10.5 K/uL   RBC 3.73 (L) 4.22 - 5.81 MIL/uL   Hemoglobin 10.9 (L) 13.0 - 17.0 g/dL   HCT 91.4 (L) 78.2 - 95.6 %   MCV 89.5 80.0 - 100.0 fL   MCH 29.2 26.0 - 34.0 pg   MCHC 32.6 30.0 - 36.0 g/dL   RDW 21.3 08.6 - 57.8 %   Platelets 162 150 - 400 K/uL   nRBC 0.0 0.0 - 0.2 %    Comment: Performed at [facility], [address] [place], [place] [address]  Differential     Status: None   Collection Time: 09/11/23  9:33 PM  Result Value Ref Range   Neutrophils Relative % 68 %   Neutro Abs 4.1 1.7 - 7.7 K/uL   Lymphocytes Relative 18 %   Lymphs Abs 1.1 0.7 - 4.0 K/uL   Monocytes Relative 10 %   Monocytes Absolute 0.6 0.1 - 1.0 K/uL   Eosinophils Relative 4 %   Eosinophils Absolute 0.3 0.0 - 0.5 K/uL   Basophils Relative 0 %   Basophils Absolute 0.0 0.0 - 0.1 K/uL   Immature Granulocytes 0 %   Abs Immature Granulocytes 0.01 0.00 - 0.07 K/uL    Comment: Performed at [facility], [address] [place], [place] [address]  Comprehensive metabolic panel     Status: Abnormal   Collection Time: 09/11/23  9:33 PM  Result Value Ref Range   Sodium 131 (L) 135 - 145 mmol/L   Potassium 3.0 (L) 3.5 - 5.1 mmol/L   Chloride 92 (L) 98 - 111 mmol/L   CO2 29 22 - 32 mmol/L   Glucose, Bld 161 (H) 70 - 99 mg/dL    Comment: Glucose reference range applies only to samples taken after fasting for at least 8 hours.   BUN 28 (H) 8 - 23 mg/dL   Creatinine, Ser 1.32 (H) 0.61 - 1.24 mg/dL   Calcium 8.6 (L) 8.9 - 10.3 mg/dL   Total Protein 6.9 6.5 - 8.1 g/dL   Albumin 3.6 3.5 - 5.0 g/dL   AST 20 15 - 41 U/L   ALT 16 0 - 44 U/L   Alkaline Phosphatase 98 38 - 126 U/L   Total Bilirubin 0.5 0.3 - 1.2 mg/dL   GFR, Estimated 14 (L) >60 mL/min    Comment: (NOTE) Calculated using  the CKD-EPI Creatinine Equation (2021)    Anion gap 10 5 - 15    Comment: Performed at [facility], [address], [place], [place] [address]  I-stat chem 8, ED     Status: Abnormal   Collection Time: 09/11/23  9:36 PM  Result Value Ref Range   Sodium 132 (L) 135 - 145 mmol/L   Potassium 3.1 (L) 3.5 - 5.1 mmol/L   Chloride 94 (L) 98 - 111 mmol/L   BUN 26 (H) 8 - 23 mg/dL   Creatinine, Ser 7.82 (H) 0.61 - 1.24 mg/dL   Glucose, Bld 956 (H) 70 - 99 mg/dL    Comment: Glucose reference range applies only to samples taken after fasting for at least 8 hours.   Calcium, Ion 0.96 (L) 1.15 - 1.40 mmol/L   TCO2 25 22 - 32 mmol/L   Hemoglobin 11.2 (L) 13.0 - 17.0 g/dL   HCT 21.3 (L) 08.6 - 57.8 %  Urine rapid drug screen (hosp performed)     Status: None   Collection Time: 09/11/23 11:54 PM  Result Value Ref Range   Opiates NONE DETECTED NONE DETECTED   Cocaine NONE DETECTED NONE DETECTED   Benzodiazepines NONE DETECTED NONE DETECTED   Amphetamines NONE DETECTED NONE DETECTED   Tetrahydrocannabinol NONE DETECTED NONE DETECTED   Barbiturates NONE DETECTED NONE DETECTED    Comment: (NOTE) DRUG SCREEN FOR MEDICAL PURPOSES ONLY.  IF CONFIRMATION IS NEEDED FOR ANY PURPOSE, NOTIFY LAB WITHIN 5 DAYS.  LOWEST DETECTABLE LIMITS FOR URINE DRUG SCREEN Drug Class                     Cutoff (ng/mL) Amphetamine and metabolites    1000 Barbiturate and metabolites    200 Benzodiazepine                 200 Opiates and metabolites        300 Cocaine and metabolites        300 THC                            50 Performed at [facility], [address] [place], [place] [address]   Urinalysis, Routine w reflex microscopic -Urine, Clean Catch     Status: Abnormal   Collection Time: 09/11/23 11:54 PM  Result Value Ref Range   Color, Urine STRAW (A) YELLOW   APPearance CLEAR CLEAR   Specific Gravity, Urine 1.010 1.005 - 1.030   pH 7.0 5.0 - 8.0   Glucose, UA 50 (A) NEGATIVE mg/dL   Hgb urine dipstick SMALL (A) NEGATIVE   Bilirubin Urine NEGATIVE NEGATIVE   Ketones, ur NEGATIVE NEGATIVE mg/dL    Protein, ur 30 (A) NEGATIVE mg/dL   Nitrite NEGATIVE NEGATIVE   Leukocytes,Ua NEGATIVE NEGATIVE   RBC / HPF 0-5 0 - 5 RBC/hpf   WBC, UA 0-5 0 - 5 WBC/hpf   Bacteria, UA RARE (A) NONE SEEN   Squamous Epithelial / HPF 0-5 0 - 5 /HPF    Comment: Performed at [facility], [address], [place], [place] [address]  MRSA Next Gen by PCR, Nasal     Status: None   Collection Time: 09/12/23 12:59 AM   Specimen: Nasal Mucosa; Nasal Swab  Result Value Ref Range   MRSA by PCR Next Gen NOT DETECTED NOT DETECTED    Comment: (NOTE) The GeneXpert MRSA Assay (FDA approved for NASAL specimens only), is one component  of a comprehensive MRSA colonization surveillance program. It is not intended to diagnose MRSA infection nor to guide or monitor treatment for MRSA infections. Test performance is not FDA approved in patients less than 50 years old. Performed at [facility], [address], [place], [place] [address]   Glucose, capillary     Status: Abnormal   Collection Time: 09/12/23  1:05 AM  Result Value Ref Range   Glucose-Capillary 116 (H) 70 - 99 mg/dL    Comment: Glucose reference range applies only to samples taken after fasting for at least 8 hours.  Lipid panel     Status: None   Collection Time: 09/12/23  8:19 AM  Result Value Ref Range   Cholesterol 123 0 - 200 mg/dL   Triglycerides 92 <562 mg/dL   HDL 64 >13 mg/dL   Total CHOL/HDL Ratio 1.9 RATIO   VLDL 18 0 - 40 mg/dL   LDL Cholesterol 41 0 - 99 mg/dL    Comment:        Total Cholesterol/HDL:CHD Risk Coronary Heart Disease Risk Table                     Men   Women  1/2 Average Risk   3.4   3.3  Average Risk       5.0   4.4  2 X Average Risk   9.6   7.1  3 X Average Risk  23.4   11.0        Use the calculated Patient Ratio above and the CHD Risk Table to determine the patient's CHD Risk.        ATP III CLASSIFICATION (LDL):  <100     mg/dL   Optimal  086-578  mg/dL   Near or Above                    Optimal   130-159  mg/dL   Borderline  469-629  mg/dL   High  >528     mg/dL   Very High Performed at [facility], [address] [place], [place] [address]     CT ANGIO HEAD NECK W WO CM (CODE STROKE)  Result Date: 09/11/2023 CLINICAL DATA:  Initial evaluation for neuro deficit, stroke suspected, left upper extremity weakness. EXAM: CT ANGIOGRAPHY HEAD AND NECK WITH AND WITHOUT CONTRAST TECHNIQUE: Multidetector CT imaging of the head and neck was performed using the standard protocol during bolus administration of intravenous contrast. Multiplanar CT image reconstructions and MIPs were obtained to evaluate the vascular anatomy. Carotid stenosis measurements (when applicable) are obtained utilizing NASCET criteria, using the distal internal carotid diameter as the denominator. RADIATION DOSE REDUCTION: This exam was performed according to the departmental dose-optimization program which includes automated exposure control, adjustment of the mA and/or kV according to patient size and/or use of iterative reconstruction technique. CONTRAST:  75mL OMNIPAQUE IOHEXOL 350 MG/ML SOLN COMPARISON:  CT from earlier the same day. FINDINGS: CTA NECK FINDINGS Aortic arch: Visualized aortic arch within normal limits for caliber. Severe atheromatous change about the arch and distal descending intrathoracic aorta with markedly irregular soft plaque about the aortic lumen. Superimposed multifocal penetrating plaques noted no high-grade stenosis about the origin the great vessels. Right carotid system: Right common and internal carotid arteries are patent without dissection. Atheromatous plaque about the right carotid bulb without hemodynamically significant greater than 50% stenosis. Left carotid system: Left common and internal carotid arteries are patent without dissection. Atheromatous  plaque about the left carotid bulb/proximal left ICA with associated stenosis of up to approximate 50% by NASCET criteria. Vertebral  arteries: Both vertebral arteries arise from subclavian arteries. No significant proximal subclavian artery stenosis. 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 Vertebral arteries are otherwise patent without stenosis or dissection. Skeleton: No worrisome osseous lesions. Moderate to advanced multilevel cervical spondylosis. Patient is edentulous. Other neck: No other acute finding. 2.3 cm sebaceous cyst noted within the lateral left neck. Right-sided central venous catheter in place. Upper chest: No other acute finding. Review of the MIP images confirms the above findings CTA HEAD FINDINGS Anterior circulation: Atheromatous change about the carotid siphons with associated moderate to severe stenoses at the para clinoid segments bilaterally. A1 segments patent bilaterally. Normal anterior communicating artery complex. Anterior cerebral arteries patent without significant stenosis. No M1 stenosis or occlusion. No proximal MCA branch occlusion or high-grade stenosis. Distal MCA branches perfused and symmetric. Posterior circulation: Dominant right V4 segment patent without significant stenosis. Focal severe stenosis seen involving the mid-distal left V4 segment (series 6, image 282). Left PICA patent at its origin. Right PICA not well seen. Basilar patent without stenosis. Superior cerebellar and posterior cerebral arteries patent bilaterally. Venous sinuses: Patent allowing for timing the contrast bolus. Anatomic variants: None significant.  No aneurysm. Review of the MIP images confirms the above findings IMPRESSION: 1. Negative CTA for large vessel occlusion or other emergent finding. 2. Atheromatous plaque about the left carotid bulb/proximal left ICA with associated stenosis of up to approximate 50% by NASCET criteria. 3. 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 4. Severe atheromatous stenosis involving the mid-distal left V4 segment. 5. Atheromatous  change about the carotid siphons with associated moderate to severe stenoses at the para clinoid segments bilaterally. 6. Severe atheromatous change about the aortic arch an proximal descending intrathoracic aorta with associated multifocal intimal irregularity and penetrating plaques. Aortic Atherosclerosis (ICD10-I70.0). Electronically Signed   By: Rise Mu M.D.   On: 09/11/2023 22:50   CT HEAD CODE STROKE WO CONTRAST  Result Date: 09/11/2023 CLINICAL DATA:  Code stroke. Neuro deficit, acute, stroke suspected. Left-sided weakness. EXAM: CT HEAD WITHOUT CONTRAST TECHNIQUE: Contiguous axial images were obtained from the base of the skull through the vertex without intravenous contrast. RADIATION DOSE REDUCTION: This exam was performed according to the departmental dose-optimization program which includes automated exposure control, adjustment of the mA and/or kV according to patient size and/or use of iterative reconstruction technique. COMPARISON:  None Available. FINDINGS: Brain: No acute intracranial hemorrhage. Gray-white differentiation is preserved. No hydrocephalus or extra-axial collection. No mass effect or midline shift. Vascular: No hyperdense vessel or unexpected calcification. Skull: No calvarial fracture or suspicious bone lesion. Skull base is unremarkable. Sinuses/Orbits: No acute finding. Other: None. ASPECTS (Alberta Stroke Program Early CT Score) - Ganglionic level infarction (caudate, lentiform nuclei, internal capsule, insula, M1-M3 cortex): 7 - Supraganglionic infarction (M4-M6 cortex): 3 Total score (0-10 with 10 being normal): 10 IMPRESSION: No acute intracranial hemorrhage or evidence of acute large vessel territory infarct. ASPECT score is 10. Code stroke imaging results were communicated on 09/11/2023 at 9:47 pm to provider Dr. Rhae Hammock via telephone, who verbally acknowledged these results. Electronically Signed   By: Orvan Falconer M.D.   On: 09/11/2023 21:47    ROS: As per  H&P, rest of systems reviewed and negative. Blood pressure (!) 184/78, pulse 72, temperature 98.1 F (36.7 C), temperature source Oral, resp. rate 16, height 5'  10" (1.778 m), weight 70.3 kg, SpO2 99%. Gen: NAD, comfortable Respiratory: Clear bilateral, no wheezing or crackle Cardiovascular: Regular rate rhythm S1-S2 normal, no rubs GI: Abdomen soft, nontender, nondistended Extremities, no cyanosis or clubbing, no edema Skin: No rash or ulcer Neurology: Alert, awake, following commands, oriented Dialysis Access: Right IJ TDC in place.  Assessment/Plan:  # Acute ischemic stroke status post tPA: Cardioembolic in the setting of A-fib, off of anticoagulation.  Stroke team is following and managing.  # ESRD: MWF schedule.  Plan for regular HD today, use TDC for the access.  No anticoagulation because of recent tPA.  # Hypertension: Currently on clevidipine because of recent stroke.  Avoid hypotensive episode.  Monitor BP.  # Anemia of ESRD: Hemoglobin at goal, no ESA.  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  Monitor lab.  Thank you for the consult.  We will continue to follow. Discussed with the patient, his wife and primary team.  Maxie Barb 09/12/2023, 1:42 PM

## 2023-09-12 NOTE — Progress Notes (Signed)
PT Cancellation Note  Patient Details Name: Jacob Farmer MRN: [id number] DOB: 06/23/1946   Cancelled Treatment:    Reason Eval/Treat Not Completed: Active bedrest order   [name] [name] 09/12/2023, 7:15 AM [name], PT Acute Rehabilitation Services Office: [phone]

## 2023-09-12 NOTE — Telephone Encounter (Signed)
Pt's wife left VM letting us know pt is currently admitted for possible CVA and to cancel his AVF scheduled for next week. I have made MD aware.

## 2023-09-12 NOTE — Progress Notes (Signed)
Echocardiogram 2D Echocardiogram has been performed.  Jacob Farmer 09/12/2023, 2:59 PM

## 2023-09-12 NOTE — Progress Notes (Signed)
CODE STROKE  Call time  2128 Beeper   2128 Start   2135 End   2139 Epic   2139 Rad called  2140

## 2023-09-12 NOTE — Progress Notes (Signed)
OT Cancellation Note  Patient Details Name: Jacob Farmer MRN: [id number] DOB: 21-Jun-1946   Cancelled Treatment:    Reason Eval/Treat Not Completed: Active bedrest order. Will follow up as pt medically appropriate.   Tyler Deis, OTR/L [facility] Acute Rehabilitation Office: [phone]   Jacob Farmer 09/12/2023, 7:22 AM

## 2023-09-12 NOTE — H&P (Signed)
NEUROLOGY H&P NOTE   Date of service: September 12, 2023 Patient Name: Jacob Farmer MRN:  [id number] DOB:  1946-05-16 Chief Complaint: "L sided weakness"  History of Present Illness  Jacob Farmer is a 77 y.o. male with past medical history of MI, hypertension, hyperlipidemia, former smoker, ESRD on HD, paroxysmal atrial fibrillation not on anticoagulation due to prior history of GI bleeds who presents with sudden onset left arm weakness and numbness and left facial droop.  Patient reports that he laid down on the bed at 8 PM.  While trying to sleep, his left arm went numb and he could not feel it.  When he tried to move his arm, it was very clumsy and all over the place.  He was unable to coordinate his arm movements.  He called for his brother who helped him to the kitchen.  Patient reports he was feeling very unsteady and so they decided to call EMS.  He was brought into [facility] emergency department where a code stroke was activated.  He was evaluated by teleneurology and CT head without contrast was negative for any acute ICH or other intracranial abnormality.  He was given TNKase.  He had CT angio head and neck with without contrast which was negative for an LVO.  He was transferred to [facility], ICU for post [facility] monitoring and neurochecks.   Last known well: 2000 Modified rankin score: 1-No significant post stroke disability and can perform usual duties with stroke symptoms tNKASE: given at [facility] ED. Thrombectomy: not offered 2/2 no LVO NIHSS components Score: Comment  1a Level of Conscious 0[x]  1[]  2[]  3[]      1b LOC Questions 0[x]  1[]  2[]       1c LOC Commands 0[x]  1[]  2[]       2 Best Gaze 0[x]  1[]  2[]       3 Visual 0[x]  1[]  2[]  3[]      4 Facial Palsy 0[]  1[x]  2[]  3[]      5a Motor Arm - left 0[x]  1[]  2[]  3[]  4[]  UN[]    5b Motor Arm - Right 0[x]  1[]  2[]  3[]  4[]  UN[]    6a Motor Leg - Left 0[x]  1[]  2[]  3[]  4[]  UN[]    6b Motor Leg - Right 0[x]  1[]  2[]  3[]  4[]  UN[]    7  Limb Ataxia 0[]  1[x]  2[]  3[]  UN[]     8 Sensory 0[x]  1[]  2[]  UN[]      9 Best Language 0[x]  1[]  2[]  3[]      10 Dysarthria 0[x]  1[]  2[]  UN[]      11 Extinct. and Inattention 0[x]  1[]  2[]       TOTAL: 2      ROS   A detailed 10 point review of system was completed and negative except above.  Past History   Past Medical History:  Diagnosis Date   CAD (coronary artery disease)    Acute myocardial infarction treated with TPA in 1990; 1999-stents to circumflex and RCA; residual total occlusion of the left anterior descending; normal ejection fraction; stress nuclear in 2001-distal anteroseptal ischemia; normal LV function   CKD (chronic kidney disease) stage 4, GFR 15-29 ml/min (HCC)    First degree heart block    History of gastric ulcer    History of kidney stones    History of MI (myocardial infarction)    1990-  treated w/ TPA   Hyperlipidemia    Hypertension    Jaundice    OA (osteoarthritis)    Prediabetes    Prostate cancer (HCC)  Stage T1c , Gleason 3+4,  PSA 4.7,  vol 24cc--  scheduled for radiative seed implants   Psoriatic arthritis (HCC)    RA (rheumatoid arthritis) (HCC)    Dr. Beatrix Fetters   Sinus bradycardia    Wears dentures    Past Surgical History:  Procedure Laterality Date   BIOPSY  08/27/2019   Procedure: BIOPSY;  Surgeon: Malissa Hippo, MD;  [facility];  Service: Endoscopy;;  gastric   BIOPSY  04/03/2022   Procedure: BIOPSY;  Surgeon: Dolores Frame, MD;  [facility];  Service: Gastroenterology;;   BIOPSY  04/21/2023   Procedure: BIOPSY;  Surgeon: Lanelle Bal, DO;  [facility];  Service: Endoscopy;;   callus removal Left 09/13/2022   left foot   CARDIOVASCULAR STRESS TEST  2001  per Dr Dietrich Pates clinic note   distal anteroseptal ischemia,  normal LVF   COLONOSCOPY  last one 2006 (approx)   COLONOSCOPY WITH PROPOFOL N/A 04/03/2022   Procedure: COLONOSCOPY WITH PROPOFOL;  Surgeon: Dolores Frame, MD;  [facility];  Service: Gastroenterology;  Laterality: N/A;  945   CORONARY ANGIOPLASTY WITH STENT PLACEMENT  1999   DES x2  to CFX and RCA/  residual total occlusion LAD,  normal LVEF   ENTEROSCOPY N/A 04/24/2023   Procedure: ENTEROSCOPY;  Surgeon: Dolores Frame, MD;  [facility];  Service: Gastroenterology;  Laterality: N/A;   ESOPHAGOGASTRODUODENOSCOPY (EGD) WITH PROPOFOL N/A 08/27/2019   Procedure: ESOPHAGOGASTRODUODENOSCOPY (EGD) WITH PROPOFOL;  Surgeon: Malissa Hippo, MD;  [facility];  Service: Endoscopy;  Laterality: N/A;  10:10   ESOPHAGOGASTRODUODENOSCOPY (EGD) WITH PROPOFOL N/A 04/03/2022   Procedure: ESOPHAGOGASTRODUODENOSCOPY (EGD) WITH PROPOFOL;  Surgeon: Dolores Frame, MD;  [facility];  Service: Gastroenterology;  Laterality: N/A;   ESOPHAGOGASTRODUODENOSCOPY (EGD) WITH PROPOFOL N/A 02/16/2023   Procedure: ESOPHAGOGASTRODUODENOSCOPY (EGD) WITH PROPOFOL;  Surgeon: Dolores Frame, MD;  [facility];  Service: Gastroenterology;  Laterality: N/A;   ESOPHAGOGASTRODUODENOSCOPY (EGD) WITH PROPOFOL N/A 04/21/2023   Procedure: ESOPHAGOGASTRODUODENOSCOPY (EGD) WITH PROPOFOL;  Surgeon: Lanelle Bal, DO;  [facility];  Service: Endoscopy;  Laterality: N/A;   EYE SURGERY Bilateral    cataract   GIVENS CAPSULE STUDY N/A 04/22/2023   Procedure: GIVENS CAPSULE STUDY;  Surgeon: Dolores Frame, MD;  [facility];  Service: Gastroenterology;  Laterality: N/A;   HEMOSTASIS CLIP PLACEMENT  04/24/2023   Procedure: HEMOSTASIS CLIP PLACEMENT;  Surgeon: Dolores Frame, MD;  [facility];  Service: Gastroenterology;;   HOT HEMOSTASIS  04/03/2022   Procedure: HOT HEMOSTASIS (ARGON PLASMA COAGULATION/BICAP);  Surgeon: Marguerita Merles, Reuel Boom, MD;  [facility];  Service: Gastroenterology;;   HOT HEMOSTASIS  04/24/2023   Procedure: HOT  HEMOSTASIS (ARGON PLASMA COAGULATION/BICAP);  Surgeon: Marguerita Merles, Reuel Boom, MD;  [facility];  Service: Gastroenterology;;   IR FLUORO GUIDE CV LINE RIGHT  05/30/2023   IR US GUIDE VASC ACCESS RIGHT  05/30/2023   POLYPECTOMY  04/03/2022   Procedure: POLYPECTOMY;  Surgeon: Dolores Frame, MD;  [facility];  Service: Gastroenterology;;   POSTERIOR CERVICAL FUSION/FORAMINOTOMY N/A 05/26/2017   Procedure: RIGHT C4-5 FORAMINOTOMY WITH EXCISION OF HERNIATED DISC;  Surgeon: Kerrin Champagne, MD;  Location: [facility] OR;  Service: Orthopedics;  Laterality: N/A;   RADIOACTIVE SEED IMPLANT N/A 01/11/2016   Procedure: RADIOACTIVE SEED IMPLANT/BRACHYTHERAPY IMPLANT;  Surgeon: Marcine Matar, MD;  Location: [facility]  ;  Service: Urology;  Laterality: N/A;   73  seeds implanted no seeds founds in bladder   SCLEROTHERAPY  04/24/2023   Procedure: SCLEROTHERAPY;  Surgeon: Marguerita Merles, Reuel Boom, MD;  [facility];  Service: Gastroenterology;;   SUBMUCOSAL TATTOO INJECTION  04/24/2023   Procedure: SUBMUCOSAL TATTOO INJECTION;  Surgeon: Marguerita Merles, Reuel Boom, MD;  [facility];  Service: Gastroenterology;;   Family History  Problem Relation Age of Onset   Heart attack Mother    Coronary artery disease Father    Ulcerative colitis Father    Ulcers Father    Breast cancer Sister    COPD Brother    Pancreatic cancer Brother    Healthy Sister    Diabetes Brother    Cirrhosis Son    Seizures Son    Social History   Socioeconomic History   Marital status: Married    Spouse name: Not on file   Number of children: 2   Years of education: Not on file   Highest education level: Not on file  Occupational History   Occupation: Retired Personnel officer  Tobacco Use   Smoking status: Former    Current packs/day: 0.00    Average packs/day: 1 pack/day for 30.0 years (30.0 ttl pk-yrs)    Types: Cigarettes    Start date: 05/22/1959    Quit  date: 05/21/1989    Years since quitting: 34.3    Passive exposure: Never   Smokeless tobacco: Never  Vaping Use   Vaping status: Never Used  Substance and Sexual Activity   Alcohol use: Yes    Comment: 1 drink per day   Drug use: No   Sexual activity: Not on file  Other Topics Concern   Not on file  Social History Narrative   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   Social Determinants of Health   Financial Resource Strain: Low Risk  (02/18/2023)   Overall Financial Resource Strain (CARDIA)    Difficulty of Paying Living Expenses: Not very hard  Food Insecurity: No Food Insecurity (04/28/2023)   Hunger Vital Sign    Worried About Running Out of Food in the Last Year: Never true    Ran Out of Food in the Last Year: Never true  Transportation Needs: No Transportation Needs (04/28/2023)   PRAPARE - Administrator, Civil Service (Medical): No    Lack of Transportation (Non-Medical): No  Physical Activity: Sufficiently Active (09/14/2021)   Exercise Vital Sign    Days of Exercise per Week: 7 days    Minutes of Exercise per Session: 60 min  Stress: No Stress Concern Present (09/14/2021)   [employer] of Occupational Health - Occupational Stress Questionnaire    Feeling of Stress : Not at all  Social Connections: Unknown (04/30/2022)   [facility]   Social Network    Social Network: Not on file   No Known Allergies  Medications   Facility-Administered Medications Prior to Admission  Medication Dose Route Frequency Provider Last Rate Last Admin   degarelix [facility]) injection 240 mg  240 mg Subcutaneous Once Marcine Matar, MD       Medications Prior to Admission  Medication Sig Dispense Refill Last Dose   allopurinol (ZYLOPRIM) 100 MG tablet TAKE 1/2 TABLET EVERY OTHER DAY 23 tablet 0 09/11/2023   amLODipine (NORVASC) 10 MG tablet Take 1 tablet (10 mg total) by mouth daily. 90 tablet 3 09/11/2023   calcitRIOL (ROCALTROL) 0.25  MCG capsule  Take 0.25 mcg by mouth every Monday, Wednesday, and Friday.   09/10/2023   Dupilumab (DUPIXENT) 300 MG/2ML SOPN Inject 300 mg into the skin every 14 (fourteen) days. Starting at day 15 for maintenance. 12 mL 1 09/05/2023   folic acid (FOLVITE) 400 MCG tablet Take 400 mcg by mouth daily.   09/11/2023   hydrALAZINE (APRESOLINE) 25 MG tablet Take 1 tablet (25 mg total) by mouth 2 (two) times daily. 180 tablet 3 09/11/2023   leflunomide (ARAVA) 10 MG tablet TAKE 1 TABLET EVERY DAY 90 tablet 0 09/11/2023   Leuprolide Acetate (ELIGARD [place]) Inject 1 Dose into the skin every 4 (four) months.   Past Month   nitroGLYCERIN (NITROSTAT) 0.4 MG SL tablet Place 0.4 mg under the tongue every 5 (five) minutes as needed for chest pain.    unknown   pantoprazole (PROTONIX) 40 MG tablet TAKE 1 TABLET TWICE DAILY 180 tablet 1 09/11/2023   RENVELA 800 MG tablet Take 800 mg by mouth 2 (two) times daily with a meal.   09/11/2023   rosuvastatin (CRESTOR) 40 MG tablet Take 1 tablet (40 mg total) by mouth daily. 90 tablet 3 09/11/2023   triamcinolone cream (KENALOG) 0.1 % Apply 1 Application topically daily. (Patient taking differently: Apply 1 Application topically daily as needed (irritation).)   Past Week     Vitals   Vitals:   09/12/23 0100 09/12/23 0106 09/12/23 0130 09/12/23 0206  BP: (!) 197/99 (!) 197/99 (!) 182/79 (!) 174/80  Pulse: 83 81 81   Resp: 14 11 15    Temp: 98.4 F (36.9 C)     TempSrc: Oral     SpO2: 97% 98% 98%   Weight:      Height:         Body mass index is 22.24 kg/m.  Physical Exam   Constitutional: Appears well-developed and well-nourished.  Psych: Affect appropriate to situation.  Eyes: No scleral injection.  HENT: No OP obstrucion.  Head: Normocephalic.  Cardiovascular: Normal rate and regular rhythm.  Respiratory: Effort normal, non-labored breathing.  GI: Soft.  No distension. There is no tenderness.  Skin: WDI.   Neurologic Examination  Mental status/Cognition:  Alert, oriented to self, place, month and year, good attention.  Speech/language: Fluent, comprehension intact, object naming intact, repetition intact.  Cranial nerves:   CN II Pupils equal and reactive to light, no VF deficits    CN III,IV,VI EOM intact, no gaze preference or deviation, no nystagmus    CN V normal sensation in V1, V2, and V3 segments bilaterally    CN VII Mild L facial droop   CN VIII normal hearing to speech    CN IX & X normal palatal elevation, no uvular deviation    CN XI 5/5 head turn and 5/5 shoulder shrug bilaterally    CN XII midline tongue protrusion    Motor:  Muscle bulk: poor, tone normal, pronator drift noted in LUE. tremor none Mvmt Root Nerve  Muscle Right Left Comments  SA C5/6 Ax Deltoid 5 5   EF C5/6 Mc Biceps 5 4   EE C6/7/8 Rad Triceps 5 4   WF C6/7 Med FCR     WE C7/8 PIN ECU     F Ab C8/T1 U ADM/FDI 5 4   HF L1/2/3 Fem Illopsoas 5 5   KE L2/3/4 Fem Quad 5 5   DF L4/5 D Peron Tib Ant 5 5   PF S1/2 Tibial Grc/Sol 5 5    Sensation:  Light touch Intact throughout   Pin prick    Temperature    Vibration   Proprioception    Coordination/Complex Motor:  - Finger to Nose with ataxia in LUE - Heel to shin intact BL - Rapid alternating movement are slowed on the left. - Gait: deferred for patient safety.   Labs   CBC:  Recent Labs  Lab 09/11/23 2133 09/11/23 2136  WBC 6.1  --   NEUTROABS 4.1  --   HGB 10.9* 11.2*  HCT 33.4* 33.0*  MCV 89.5  --   PLT 162  --     Basic Metabolic Panel:  Lab Results  Component Value Date   NA 132 (L) 09/11/2023   K 3.1 (L) 09/11/2023   CO2 29 09/11/2023   GLUCOSE 156 (H) 09/11/2023   BUN 26 (H) 09/11/2023   CREATININE 4.70 (H) 09/11/2023   CALCIUM 8.6 (L) 09/11/2023   GFRNONAA 14 (L) 09/11/2023   GFRAA 27 (L) 06/14/2021   Lipid Panel:  Lab Results  Component Value Date   LDLCALC 42 01/31/2023   HgbA1c:  Lab Results  Component Value Date   HGBA1C 5.3 08/07/2023   Urine Drug  Screen:     Component Value Date/Time   LABOPIA NONE DETECTED 09/11/2023 2354   COCAINSCRNUR NONE DETECTED 09/11/2023 2354   LABBENZ NONE DETECTED 09/11/2023 2354   AMPHETMU NONE DETECTED 09/11/2023 2354   THCU NONE DETECTED 09/11/2023 2354   LABBARB NONE DETECTED 09/11/2023 2354    Alcohol Level     Component Value Date/Time   ETH <10 09/11/2023 2133   INR  Lab Results  Component Value Date   INR 0.9 09/11/2023   APTT  Lab Results  Component Value Date   APTT 24 09/11/2023     CT Head without contrast(Personally reviewed): CTH was negative for a large hypodensity concerning for a large territory infarct or hyperdensity concerning for an ICH  CT angio Head and Neck with contrast(Personally reviewed): No LVO  MRI Brain: pending  Impression   EXAVIER MCANNALLY is a 77 y.o. male ith past medical history of MI, hypertension, hyperlipidemia, former smoker, ESRD on HD, paroxysmal atrial fibrillation not on anticoagulation due to prior history of GI bleeds who presents with sudden onset left arm weakness and numbness and left facial droop.  Presentation is clinically concerning for a stroke. He was given tnkase at [facility]. Etiology of his stroke is pending full workup and further imaging.  Recommendations  Left sided weakness concerning for a stroke s/p tnkase: - Frequent NeuroChecks for post tNK care per stroke unit protocol: - Initial CTH demonstrated no acute hemorrhage or mass - MRI Brain - pending - CTA - no LVO - TTE - pending - Lipid Panel: LDL - pending  - Statin: if LDL > 70 - HbA1c: pending - Antithrombotic: Start ASA 81 mg daily if 24 h CTH does not show acute hemorrhage - DVT prophylaxis: SCDs. Pharmacologic prophylaxis if 24 h CTH does not demonstrate acute hemorrhage - Systolic Blood Pressure goal: < 180 mm Hg - Telemetry monitoring for arrhythmia: 72 hours - Swallow screen - ordered - PT/OT/SLP consults   ESRD on HD MWF: - consult  nephrology in AM. Might need additional sessions given he got iodinated contrast for CTA.  Patient is full code, verified allergies and he denies any allergies. He would like his wife to be his HCPOA in case if he is unable to make medical decisions by himself.  This patient is  critically ill and at significant risk of neurological worsening, death and care requires constant monitoring of vital signs, hemodynamics,respiratory and cardiac monitoring, neurological assessment, discussion with family, other specialists and medical decision making of high complexity. I spent 50 minutes of neurocritical care time  in the care of  this patient. This was time spent independent of any time provided by nurse practitioner or PA.  Erick Blinks [facility] 09/12/2023  3:04 AM  ______________________________________________________________________   Jacob Farmer

## 2023-09-12 NOTE — Plan of Care (Signed)
  Problem: Education: Goal: Knowledge of disease or condition will improve Outcome: Progressing Goal: Knowledge of secondary prevention will improve (MUST DOCUMENT ALL) Outcome: Progressing Goal: Knowledge of patient specific risk factors will improve (Mark N/A or DELETE if not current risk factor) Outcome: Progressing   Problem: Coping: Goal: Will verbalize positive feelings about self Outcome: Progressing   

## 2023-09-12 NOTE — Plan of Care (Signed)
  Problem: Education: Goal: Knowledge of General Education information will improve Description: Including pain rating scale, medication(s)/side effects and non-pharmacologic comfort measures 09/12/2023 1713 by Dustin Flock, RN Outcome: Progressing 09/12/2023 1713 by Dustin Flock, RN Outcome: Progressing   Problem: Health Behavior/Discharge Planning: Goal: Ability to manage health-related needs will improve 09/12/2023 1713 by Dustin Flock, RN Outcome: Progressing 09/12/2023 1713 by Dustin Flock, RN Outcome: Progressing   Problem: Clinical Measurements: Goal: Ability to maintain clinical measurements within normal limits will improve 09/12/2023 1713 by Dustin Flock, RN Outcome: Progressing 09/12/2023 1713 by Dustin Flock, RN Outcome: Progressing Goal: Will remain free from infection 09/12/2023 1713 by Dustin Flock, RN Outcome: Progressing 09/12/2023 1713 by Dustin Flock, RN Outcome: Progressing Goal: Diagnostic test results will improve 09/12/2023 1713 by Dustin Flock, RN Outcome: Progressing 09/12/2023 1713 by Dustin Flock, RN Outcome: Progressing Goal: Respiratory complications will improve 09/12/2023 1713 by Dustin Flock, RN Outcome: Progressing 09/12/2023 1713 by Dustin Flock, RN Outcome: Progressing Goal: Cardiovascular complication will be avoided 09/12/2023 1713 by Dustin Flock, RN Outcome: Progressing 09/12/2023 1713 by Dustin Flock, RN Outcome: Progressing   Problem: Clinical Measurements: Goal: Will remain free from infection 09/12/2023 1713 by Dustin Flock, RN Outcome: Progressing 09/12/2023 1713 by Dustin Flock, RN Outcome: Progressing   Problem: Activity: Goal: Risk for activity intolerance will decrease 09/12/2023 1713 by Dustin Flock, RN Outcome: Progressing 09/12/2023 1713 by Dustin Flock, RN Outcome: Progressing   Problem: Nutrition: Goal: Adequate nutrition will be maintained 09/12/2023 1713 by Dustin Flock, RN Outcome:  Progressing 09/12/2023 1713 by Dustin Flock, RN Outcome: Progressing   Problem: Coping: Goal: Level of anxiety will decrease 09/12/2023 1713 by Dustin Flock, RN Outcome: Progressing 09/12/2023 1713 by Dustin Flock, RN Outcome: Progressing

## 2023-09-13 ENCOUNTER — Inpatient Hospital Stay (HOSPITAL_COMMUNITY): Payer: Medicare HMO

## 2023-09-13 DIAGNOSIS — I639 Cerebral infarction, unspecified: Secondary | ICD-10-CM | POA: Diagnosis not present

## 2023-09-13 DIAGNOSIS — I482 Chronic atrial fibrillation, unspecified: Secondary | ICD-10-CM

## 2023-09-13 LAB — HEPATITIS B SURFACE ANTIBODY, QUANTITATIVE: Hep B S AB Quant (Post): 1818 m[IU]/mL

## 2023-09-13 LAB — C DIFFICILE QUICK SCREEN W PCR REFLEX
C Diff antigen: NEGATIVE
C Diff interpretation: NOT DETECTED
C Diff toxin: NEGATIVE

## 2023-09-13 MED ORDER — ONDANSETRON HCL 4 MG/2ML IJ SOLN
4.0000 mg | Freq: Four times a day (QID) | INTRAMUSCULAR | Status: DC | PRN
  Administered 2023-09-13: 4 mg via INTRAVENOUS
  Filled 2023-09-13: qty 2

## 2023-09-13 MED ORDER — HYDRALAZINE HCL 20 MG/ML IJ SOLN
20.0000 mg | INTRAMUSCULAR | Status: DC | PRN
  Administered 2023-09-13: 20 mg via INTRAVENOUS
  Filled 2023-09-13: qty 1

## 2023-09-13 MED ORDER — LABETALOL HCL 5 MG/ML IV SOLN
20.0000 mg | INTRAVENOUS | Status: DC | PRN
Start: 2023-09-13 — End: 2023-09-13

## 2023-09-13 MED ORDER — LABETALOL HCL 5 MG/ML IV SOLN
20.0000 mg | INTRAVENOUS | Status: DC | PRN
  Administered 2023-09-13: 20 mg via INTRAVENOUS
  Filled 2023-09-13: qty 4

## 2023-09-13 MED ORDER — HYDRALAZINE HCL 50 MG PO TABS
50.0000 mg | ORAL_TABLET | Freq: Two times a day (BID) | ORAL | Status: DC
  Administered 2023-09-13 – 2023-09-14 (×3): 50 mg via ORAL
  Filled 2023-09-13 (×4): qty 1

## 2023-09-13 MED ORDER — HEPARIN SODIUM (PORCINE) 5000 UNIT/ML IJ SOLN
5000.0000 [IU] | Freq: Three times a day (TID) | INTRAMUSCULAR | Status: DC
  Administered 2023-09-14 – 2023-09-16 (×7): 5000 [IU] via SUBCUTANEOUS
  Filled 2023-09-13 (×7): qty 1

## 2023-09-13 MED ORDER — HYDRALAZINE HCL 25 MG PO TABS
25.0000 mg | ORAL_TABLET | Freq: Once | ORAL | Status: AC
  Administered 2023-09-13: 25 mg via ORAL
  Filled 2023-09-13: qty 1

## 2023-09-13 MED ORDER — LIDOCAINE 5 % EX PTCH
1.0000 | MEDICATED_PATCH | CUTANEOUS | Status: DC
  Administered 2023-09-13 – 2023-09-16 (×4): 1 via TRANSDERMAL
  Filled 2023-09-13 (×4): qty 1

## 2023-09-13 MED ORDER — HEPARIN SODIUM (PORCINE) 1000 UNIT/ML IJ SOLN
INTRAMUSCULAR | Status: AC
  Filled 2023-09-13: qty 1

## 2023-09-13 NOTE — Evaluation (Signed)
Speech Language Pathology Evaluation Patient Details Name: Jacob Farmer MRN: [id number] DOB: 03-Jun-1946 Today's Date: 09/13/2023 Time: 5462-7035 SLP Time Calculation (min) (ACUTE ONLY): 12 min  Problem List:  Patient Active Problem List   Diagnosis Date Noted   Stroke determined by clinical assessment (HCC) 09/12/2023   Acute CVA (cerebrovascular accident) (HCC) 09/11/2023   Ischemic ulcer (HCC) 08/11/2023   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   Prostate cancer (HCC) 08/07/2023   Jejunal ulcer 06/02/2023   Abnormal ultrasound of abdomen 06/02/2023   Acute blood loss anemia 04/19/2023   Anemia due to stage 4 chronic kidney disease (HCC) 03/25/2023   Iron deficiency anemia 03/25/2023   Hospital discharge follow-up 02/26/2023   ABLA (acute blood loss anemia) 02/26/2023   Acquired thrombophilia (HCC) 02/13/2023   Generalized weakness 02/13/2023   Acute bronchitis 02/13/2023   Drug-induced liver injury 02/08/2023   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   Bleeding from right ear 12/25/2022   Chronic heart failure with preserved ejection fraction (HCC) 07/22/2022   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   Dizziness 04/09/2022   Encounter for general adult medical examination with abnormal findings 01/25/2022   Paroxysmal atrial fibrillation (HCC) 01/22/2022   Allergic dermatitis 09/12/2021   Herniated cervical disc 05/26/2017    Class: Acute   Cervical spinal stenosis 05/26/2017   Primary osteoarthritis of both hands 02/04/2017   History of gout 02/04/2017   DDD (degenerative disc disease), lumbar 02/04/2017   High risk medication use 01/28/2017   History of prostate cancer 01/28/2017   Chronic kidney disease, stage 4 (severe) (HCC) 12/27/2011   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   Rheumatoid arthritis (HCC) 01/04/2011   SINUS BRADYCARDIA 01/12/2010   Coronary artery disease involving native coronary artery of  native heart without angina pectoris 01/12/2010   Hyperlipidemia 01/08/2010   Essential hypertension 01/08/2010   Past Medical History:  Past Medical History:  Diagnosis Date   CAD (coronary artery disease)    Acute myocardial infarction treated with TPA in 1990; 1999-stents to circumflex and RCA; residual total occlusion of the left anterior descending; normal ejection fraction; stress nuclear in 2001-distal anteroseptal ischemia; normal LV function   CKD (chronic kidney disease) stage 4, GFR 15-29 ml/min (HCC)    First degree heart block    History of gastric ulcer    History of kidney stones    History of MI (myocardial infarction)    1990-  treated w/ TPA   Hyperlipidemia    Hypertension    Jaundice    OA (osteoarthritis)    Prediabetes    Prostate cancer (HCC)    Stage T1c , Gleason 3+4,  PSA 4.7,  vol 24cc--  scheduled for radiative seed implants   Psoriatic arthritis (HCC)    RA (rheumatoid arthritis) (HCC)    Dr. Beatrix Fetters   Sinus bradycardia    Wears dentures    Past Surgical History:  Past Surgical History:  Procedure Laterality Date   BIOPSY  08/27/2019   Procedure: BIOPSY;  Surgeon: Malissa Hippo, MD;  [facility];  Service: Endoscopy;;  gastric   BIOPSY  04/03/2022   Procedure: BIOPSY;  Surgeon: Dolores Frame, MD;  [facility]: [facility];  Service: Gastroenterology;;   BIOPSY  04/21/2023   Procedure: BIOPSY;  Surgeon: Lanelle Bal, DO;  [facility]: [facility];  Service: Endoscopy;;   callus removal Left 09/13/2022   left foot   CARDIOVASCULAR STRESS TEST  2001  per Dr Dietrich Pates clinic note   distal anteroseptal ischemia,  normal LVF   COLONOSCOPY  last one 2006 (approx)   COLONOSCOPY WITH PROPOFOL N/A 04/03/2022   Procedure: COLONOSCOPY WITH PROPOFOL;  Surgeon: Dolores Frame, MD;  [facility]: [facility];  Service: Gastroenterology;  Laterality: N/A;  945   CORONARY ANGIOPLASTY WITH STENT PLACEMENT  1999   DES x2  to  CFX and RCA/  residual total occlusion LAD,  normal LVEF   ENTEROSCOPY N/A 04/24/2023   Procedure: ENTEROSCOPY;  Surgeon: Dolores Frame, MD;  [facility];  Service: Gastroenterology;  Laterality: N/A;   ESOPHAGOGASTRODUODENOSCOPY (EGD) WITH PROPOFOL N/A 08/27/2019   Procedure: ESOPHAGOGASTRODUODENOSCOPY (EGD) WITH PROPOFOL;  Surgeon: Malissa Hippo, MD;  [facility];  Service: Endoscopy;  Laterality: N/A;  10:10   ESOPHAGOGASTRODUODENOSCOPY (EGD) WITH PROPOFOL N/A 04/03/2022   Procedure: ESOPHAGOGASTRODUODENOSCOPY (EGD) WITH PROPOFOL;  Surgeon: Dolores Frame, MD;  [facility];  Service: Gastroenterology;  Laterality: N/A;   ESOPHAGOGASTRODUODENOSCOPY (EGD) WITH PROPOFOL N/A 02/16/2023   Procedure: ESOPHAGOGASTRODUODENOSCOPY (EGD) WITH PROPOFOL;  Surgeon: Dolores Frame, MD;  [facility];  Service: Gastroenterology;  Laterality: N/A;   ESOPHAGOGASTRODUODENOSCOPY (EGD) WITH PROPOFOL N/A 04/21/2023   Procedure: ESOPHAGOGASTRODUODENOSCOPY (EGD) WITH PROPOFOL;  Surgeon: Lanelle Bal, DO;  [facility];  Service: Endoscopy;  Laterality: N/A;   EYE SURGERY Bilateral    cataract   GIVENS CAPSULE STUDY N/A 04/22/2023   Procedure: GIVENS CAPSULE STUDY;  Surgeon: Dolores Frame, MD;  [facility];  Service: Gastroenterology;  Laterality: N/A;   HEMOSTASIS CLIP PLACEMENT  04/24/2023   Procedure: HEMOSTASIS CLIP PLACEMENT;  Surgeon: Dolores Frame, MD;  [facility];  Service: Gastroenterology;;   HOT HEMOSTASIS  04/03/2022   Procedure: HOT HEMOSTASIS (ARGON PLASMA COAGULATION/BICAP);  Surgeon: Marguerita Merles, Reuel Boom, MD;  [facility];  Service: Gastroenterology;;   HOT HEMOSTASIS  04/24/2023   Procedure: HOT HEMOSTASIS (ARGON PLASMA COAGULATION/BICAP);  Surgeon: Marguerita Merles, Reuel Boom, MD;  [facility];  Service: Gastroenterology;;   IR FLUORO  GUIDE CV LINE RIGHT  05/30/2023   IR US GUIDE VASC ACCESS RIGHT  05/30/2023   POLYPECTOMY  04/03/2022   Procedure: POLYPECTOMY;  Surgeon: Dolores Frame, MD;  [facility];  Service: Gastroenterology;;   POSTERIOR CERVICAL FUSION/FORAMINOTOMY N/A 05/26/2017   Procedure: RIGHT C4-5 FORAMINOTOMY WITH EXCISION OF HERNIATED DISC;  Surgeon: Kerrin Champagne, MD;  Location: [facility];  Service: Orthopedics;  Laterality: N/A;   RADIOACTIVE SEED IMPLANT N/A 01/11/2016   Procedure: RADIOACTIVE SEED IMPLANT/BRACHYTHERAPY IMPLANT;  Surgeon: Marcine Matar, MD;  Location: [facility];  Service: Urology;  Laterality: N/A;   73  seeds implanted no seeds founds in bladder   SCLEROTHERAPY  04/24/2023   Procedure: SCLEROTHERAPY;  Surgeon: Marguerita Merles, Reuel Boom, MD;  [facility];  Service: Gastroenterology;;   SUBMUCOSAL TATTOO INJECTION  04/24/2023   Procedure: SUBMUCOSAL TATTOO INJECTION;  Surgeon: Marguerita Merles, Reuel Boom, MD;  [facility];  Service: Gastroenterology;;   HPI:  77 y.o. male admitted 9/26 with sudden LUD weakness and Lt facial droop s/p TNK. PMHx: MI, HTN, HLD, former smoker, ESRD on HD, PAF, CAD, prostate CA   Assessment / Plan / Recommendation Clinical Impression  Pt seen for skilled ST services for cognitive evaluation. Upon arrival, pt reports feeling tired. Pt reports no cognitive concerns and no change from baseline. Pt was given the SLUMS  where he scored a 25/30, which is barely indicative of an MCI. However, given pt's fatigue status, presumed [facility] for cognition. The pt's noted areas of strengths were: orientation, attention, delayed recall, visual spatial, executive function. The pt's areas of relative weaknesses were: executive function with extrapolation and numeric calculation. The pt's voice was hoarse, low, and slightly slurred. Pt reports baseline with voice and artic, per pt's wife, the pt's voice is hoarse following dialysis.  The pt's cog status is [facility] given fatigue status and is presumed at cog baseline. No further speech needs at this time, SLP signing off.    SLP Assessment  SLP Recommendation/Assessment: Patient does not need any further Speech Lanaguage Pathology Services SLP Visit Diagnosis: Cognitive communication deficit (R41.841)    Recommendations for follow up therapy are one component of a multi-disciplinary discharge planning process, led by the attending physician.  Recommendations may be updated based on patient status, additional functional criteria and insurance authorization.    Follow Up Recommendations       Assistance Recommended at Discharge  None  Functional Status Assessment Patient has had a recent decline in their functional status and demonstrates the ability to make significant improvements in function in a reasonable and predictable amount of time.  Frequency and Duration           SLP Evaluation Cognition  Overall Cognitive Status: Within Functional Limits for tasks assessed Arousal/Alertness: Awake/alert (slightly lethargic) Orientation Level: Oriented X4 Year: 2024 Day of Week: Correct Attention: Sustained Focused Attention: Appears intact Sustained Attention: Appears intact Memory: Appears intact Awareness: Appears intact Problem Solving: Appears intact Executive Function: Organizing Sequencing: Appears intact Organizing: Appears intact Safety/Judgment: Appears intact       Comprehension  Auditory Comprehension Overall Auditory Comprehension: Appears within functional limits for tasks assessed Yes/No Questions: Within Functional Limits Commands: Within Functional Limits Conversation: Complex Interfering Components: Other (comment) (fatigue) Visual Recognition/Discrimination Discrimination: Within Function Limits Reading Comprehension Reading Status: Not tested    Expression Expression Primary Mode of Expression: Verbal Verbal Expression Overall Verbal  Expression: Appears within functional limits for tasks assessed Written Expression Dominant Hand: Right   Oral / Motor  Motor Speech Overall Motor Speech: Appears within functional limits for tasks assessed Intelligibility: Intelligible (Pt's voice was slightly hoarse and slightly slurred. Pt's wife reports that he always sounds like that after dialysis (which he had yesterday))            Dione Housekeeper M.S. CCC-SLP

## 2023-09-13 NOTE — Evaluation (Signed)
Occupational Therapy Evaluation Patient Details Name: Jacob Farmer MRN: [id number] DOB: April 11, 1946 Today's Date: 09/13/2023   History of Present Illness 77 y.o. male admitted 9/26 with sudden LUD weakness and Lt facial droop s/p TNK. PMHx: MI, HTN, HLD, former smoker, ESRD on HD, PAF, CAD, prostate CA   Clinical Impression   PTA, pt lived with wife and was independent. Upon eval, pt performing UB Adl with up to mod A and LB AD with up to max A. Pt with decreased balance, LUE weakness and coordination. Pt motivated to return to PLOF. Pt wife present and supportive. Initiated weightbearing to normalize muscle tone to LUE at EOB with lateral leans and in standing on extended extremity with RW and OT supporting wrist after assisting with placement of UE. Pt with good tolerance. Due to significant change in functional status, recommending intensive multidisciplinary rehabilitation >3 hours/day to optimize safety and independence in ADL.        If plan is discharge home, recommend the following: A little help with walking and/or transfers;A lot of help with bathing/dressing/bathroom;Assistance with cooking/housework;Assist for transportation;Help with stairs or ramp for entrance    Functional Status Assessment  Patient has had a recent decline in their functional status and demonstrates the ability to make significant improvements in function in a reasonable and predictable amount of time.  Equipment Recommendations  Other (comment) (defer)    Recommendations for Other Services Rehab consult     Precautions / Restrictions Precautions Precautions: Fall Precaution Comments: L hemiparesis      Mobility Bed Mobility Overal bed mobility: Needs Assistance Bed Mobility: Supine to Sit, Sit to Supine     Supine to sit: Used rails, Contact guard Sit to supine: Contact guard assist   General bed mobility comments: cues for technique for supine to sit using R hand on rail and some assist to  lift trunk; to supine assist for positioning    Transfers Overall transfer level: Needs assistance Equipment used: Rolling walker (2 wheels) Transfers: Sit to/from Stand Sit to Stand: Min assist           General transfer comment: cues for technique, assist for balance with support at R wrist for placing UE on RW. Used RW for engagement in weightbearing on extended UE with support at wrist for joint stability      Balance Overall balance assessment: Needs assistance   Sitting balance-Leahy Scale: Fair     Standing balance support: Single extremity supported Standing balance-Leahy Scale: Poor Standing balance comment: Needs support in standing                           ADL either performed or assessed with clinical judgement   ADL Overall ADL's : Needs assistance/impaired     Grooming: Minimal assistance;Sitting Grooming Details (indicate cue type and reason): for bil tasks Upper Body Bathing: Moderate assistance   Lower Body Bathing: Sit to/from stand;Moderate assistance   Upper Body Dressing : Moderate assistance;Sitting   Lower Body Dressing: Maximal assistance;Sit to/from stand   Toilet Transfer: Minimal assistance;Stand-pivot;Rolling walker (2 wheels)   Toileting- Clothing Manipulation and Hygiene: Total assistance;Bed level Toileting - Clothing Manipulation Details (indicate cue type and reason): Pt with BM at EOB and seemingly poor awareness. pt with max request not to stand again so performing at bed level     Functional mobility during ADLs: Minimal assistance;Rolling walker (2 wheels) (assist for balance and RW management)  Vision Baseline Vision/History: 0 No visual deficits Ability to See in Adequate Light: 0 Adequate Patient Visual Report: No change from baseline Vision Assessment?: No apparent visual deficits Additional Comments: not formally assessed     Perception         Praxis         Pertinent Vitals/Pain Pain  Assessment Pain Assessment: No/denies pain     Extremity/Trunk Assessment Upper Extremity Assessment Upper Extremity Assessment: Right hand dominant;LUE deficits/detail LUE Deficits / Details: No active motion at wrist or digits. elbow flex/ext grossly [facility] but poor control and 3/5 strength. Shoulder 4/5 LUE Sensation: decreased light touch;decreased proprioception (tingling) LUE Coordination: decreased fine motor;decreased gross motor   Lower Extremity Assessment Lower Extremity Assessment: Defer to PT evaluation LLE Deficits / Details: hip flexion 3+/5, knee extension 4/5, ankle DF 4+/5       Communication Communication Communication: Hearing impairment   Cognition Arousal: Alert Behavior During Therapy: WFL for tasks assessed/performed Overall Cognitive Status: Within Functional Limits for tasks assessed                                 General Comments: questionable Decr attention to L side of body     General Comments  c/o nausea after mobility and noted BP elevated 174/79; RN Aware    Exercises     Shoulder Instructions      Home Living Family/patient expects to be discharged to:: Private residence Living Arrangements: Spouse/significant other Available Help at Discharge: Family;Available 24 hours/day Type of Home: House Home Access: Stairs to enter [employer] of Steps: 2 Entrance Stairs-Rails: None Home Layout: One level     Bathroom Shower/Tub: Producer, television/film/video: Standard     Home Equipment: Rollator (4 wheels);Shower seat - built in;Cane - single Librarian, academic (2 wheels)      Lives With: Spouse Jacob Farmer)    Prior Functioning/Environment Prior Level of Function : Independent/Modified Independent;Driving             Mobility Comments: Tourist information centre manager without AD, drives ADLs Comments: Independent        OT Problem List: Decreased strength;Decreased activity tolerance;Impaired balance  (sitting and/or standing);Decreased coordination;Decreased knowledge of use of DME or AE;Impaired sensation;Impaired UE functional use      OT Treatment/Interventions: Self-care/ADL training;Neuromuscular education;Therapeutic exercise;DME and/or AE instruction;Balance training;Patient/family education;Therapeutic activities    OT Goals(Current goals can be found in the care plan section) Acute Rehab OT Goals Patient Stated Goal: get better OT Goal Formulation: With patient Time For Goal Achievement: 09/27/23 Potential to Achieve Goals: Good  OT Frequency: Min 1X/week    Co-evaluation              AM-PAC OT "6 Clicks" Daily Activity     Outcome Measure Help from another person eating meals?: A Little Help from another person taking care of personal grooming?: A Little Help from another person toileting, which includes using toliet, bedpan, or urinal?: A Lot Help from another person bathing (including washing, rinsing, drying)?: A Lot Help from another person to put on and taking off regular upper body clothing?: A Lot Help from another person to put on and taking off regular lower body clothing?: A Little 6 Click Score: 15   End of Session Equipment Utilized During Treatment: Gait belt;Rolling walker (2 wheels) Nurse Communication: Mobility status  Activity Tolerance: Patient tolerated treatment well Patient left: in bed;with call bell/phone within reach;with bed  alarm set;with family/visitor present  OT Visit Diagnosis: Unsteadiness on feet (R26.81);Muscle weakness (generalized) (M62.81);Hemiplegia and hemiparesis Hemiplegia - Right/Left: Left Hemiplegia - dominant/non-dominant: Non-Dominant                Time: 2595-6387 OT Time Calculation (min): 29 min Charges:  OT General Charges $OT Visit: 1 Visit OT Evaluation $OT Eval Moderate Complexity: 1 Mod OT Treatments $Therapeutic Activity: 8-22 mins  Tyler Deis, OTR/L [facility] Acute Rehabilitation Office:  [phone]   Myrla Halsted 09/13/2023, 5:49 PM

## 2023-09-13 NOTE — Plan of Care (Signed)
  Problem: Education: Goal: Knowledge of disease or condition will improve Outcome: Progressing Goal: Knowledge of secondary prevention will improve (MUST DOCUMENT ALL) Outcome: Progressing   Problem: Ischemic Stroke/TIA Tissue Perfusion: Goal: Complications of ischemic stroke/TIA will be minimized Outcome: Progressing   Problem: Coping: Goal: Will verbalize positive feelings about self Outcome: Progressing Goal: Will identify appropriate support needs Outcome: Progressing

## 2023-09-13 NOTE — Evaluation (Signed)
Physical Therapy Evaluation Patient Details Name: Jacob Farmer MRN: [id number] DOB: 1946-07-10 Today's Date: 09/13/2023  History of Present Illness  77 y.o. male admitted 9/26 with sudden LUD weakness and Lt facial droop s/p TNK. PMHx: MI, HTN, HLD, former smoker, ESRD on HD, PAF, CAD, prostate CA  Clinical Impression  Patient presents with decreased mobility due to deficits in generalized weakness, decreased balance, decreased coordination, L hemiparesis and decreased activity tolerance.  Currently min A with R HHA for mobilizing up to bathroom and into hallway.  He was previously independent at home, though not showering due to HD catheter.  He will benefit from skilled PT in the acute setting and may need intensive inpatient rehab prior to d/c home.         If plan is discharge home, recommend the following: A little help with walking and/or transfers;A little help with bathing/dressing/bathroom;Assistance with cooking/housework;Assist for transportation;Help with stairs or ramp for entrance   Can travel by private vehicle        Equipment Recommendations None recommended by PT  Recommendations for Other Services  Rehab consult    Functional Status Assessment Patient has had a recent decline in their functional status and demonstrates the ability to make significant improvements in function in a reasonable and predictable amount of time.     Precautions / Restrictions Precautions Precautions: Fall Precaution Comments: L hemiparesis      Mobility  Bed Mobility Overal bed mobility: Needs Assistance Bed Mobility: Supine to Sit, Sit to Supine     Supine to sit: Used rails, Contact guard Sit to supine: Contact guard assist   General bed mobility comments: cues for technique for supine to sit using R hand on rail and some assist to lift trunk; to supine assist for positioning    Transfers Overall transfer level: Needs assistance Equipment used: 1 person hand held  assist Transfers: Sit to/from Stand Sit to Stand: Min assist           General transfer comment: cues for technique, assist for balance with R UE support    Ambulation/Gait Ambulation/Gait assistance: Min assist Gait Distance (Feet): 60 Feet Assistive device: 1 person hand held assist Gait Pattern/deviations: Step-to pattern, Step-through pattern, Decreased stride length, Ataxic       General Gait Details: mild ataxia with L LE incoordination; R HHA and increased time  Stairs            Wheelchair Mobility     Tilt Bed    Modified Rankin (Stroke Patients Only) Modified Rankin (Stroke Patients Only) Pre-Morbid Rankin Score: No significant disability Modified Rankin: Moderately severe disability     Balance Overall balance assessment: Needs assistance   Sitting balance-Leahy Scale: Fair Sitting balance - Comments: in bathroom on toilet static balance ok, min A for leaning to try to complete toilet hygiene   Standing balance support: Single extremity supported Standing balance-Leahy Scale: Poor Standing balance comment: R UE support in standing                             Pertinent Vitals/Pain Pain Assessment Pain Assessment: No/denies pain    Home Living Family/patient expects to be discharged to:: Private residence Living Arrangements: Spouse/significant other Available Help at Discharge: Family;Available 24 hours/day Type of Home: House Home Access: Stairs to enter Entrance Stairs-Rails: None Entrance Stairs-Number of Steps: 2   Home Layout: One level Home Equipment: Rollator (4 wheels);Shower seat - built in;Cane -  single point;Rolling Walker (2 wheels)      Prior Function Prior Level of Function : Independent/Modified Independent;Driving                     Extremity/Trunk Assessment   Upper Extremity Assessment Upper Extremity Assessment: Right hand dominant;LUE deficits/detail LUE Deficits / Details: limited finger and  wrist strength with decreased coordination of movement; elbow flex/ext and shoulder flexion AROM WFL; strength grossly 4/5 LUE Sensation: decreased light touch;decreased proprioception (tinging throughout) LUE Coordination: decreased fine motor;decreased gross motor    Lower Extremity Assessment Lower Extremity Assessment: LLE deficits/detail LLE Deficits / Details: hip flexion 3+/5, knee extension 4/5, ankle DF 4+/5       Communication   Communication Communication: Hearing impairment  Cognition Arousal: Alert Behavior During Therapy: WFL for tasks assessed/performed Overall Cognitive Status: Within Functional Limits for tasks assessed                                          General Comments General comments (skin integrity, edema, etc.): c/o nausea after mobility and noted BP elevated 174/79; RN Aware    Exercises     Assessment/Plan    PT Assessment Patient needs continued PT services  PT Problem List Decreased strength;Decreased activity tolerance;Decreased balance;Decreased mobility;Decreased coordination;Impaired sensation;Decreased safety awareness;Decreased knowledge of use of DME       PT Treatment Interventions DME instruction;Gait training;Stair training;Patient/family education;Functional mobility training;Therapeutic activities;Therapeutic exercise;Balance training    PT Goals (Current goals can be found in the Care Plan section)  Acute Rehab PT Goals Patient Stated Goal: to return home PT Goal Formulation: With patient/family Time For Goal Achievement: 09/26/23 Potential to Achieve Goals: Good    Frequency Min 1X/week     Co-evaluation               AM-PAC PT "6 Clicks" Mobility  Outcome Measure Help needed turning from your back to your side while in a flat bed without using bedrails?: A Little Help needed moving from lying on your back to sitting on the side of a flat bed without using bedrails?: A Little Help needed moving  to and from a bed to a chair (including a wheelchair)?: A Little Help needed standing up from a chair using your arms (e.g., wheelchair or bedside chair)?: A Little Help needed to walk in hospital room?: A Little Help needed climbing 3-5 steps with a railing? : Total 6 Click Score: 16    End of Session Equipment Utilized During Treatment: Gait belt Activity Tolerance: Patient limited by fatigue Patient left: in bed;with call bell/phone within reach;with family/visitor present   PT Visit Diagnosis: Other abnormalities of gait and mobility (R26.89);Ataxic gait (R26.0);Hemiplegia and hemiparesis Hemiplegia - Right/Left: Left Hemiplegia - dominant/non-dominant: Non-dominant Hemiplegia - caused by: Cerebral infarction    Time: 1610-9604 PT Time Calculation (min) (ACUTE ONLY): 30 min   Charges:   PT Evaluation $PT Eval Moderate Complexity: 1 Mod PT Treatments $Gait Training: 8-22 mins PT General Charges $$ ACUTE PT VISIT: 1 Visit         Sheran Lawless, PT Acute Rehabilitation Services Office:[phone] 09/13/2023   Elray Mcgregor 09/13/2023, 2:05 PM

## 2023-09-13 NOTE — Progress Notes (Signed)
[facility]PH[facility]OLOG[facility] PROGRESS NOTE  Assessment/ Plan: Pt is a 77 y.o. yo male  ith past medical history significant for hypertension, HLD, CAD, Psoriatec arthritis, A-fib not on anticoagulation because of bleeding ulcer, ESRD on HD who was presented with acute stroke status post tPA. For ESRD:MWF at [facility] in [place]. RIJ TDC for the access.   # Acute ischemic stroke status post tPA: Cardioembolic in the setting of A-fib, off of anticoagulation.  Stroke team is following and managing.   # ESRD: MWF schedule.  Status post HD yesterday, reportedly some issue with the catheter.  UF around 1 L.  Plan for next HD on Monday.     # Hypertension: Resume home antihypertensives, monitor BP. Avoid hypotensive episode.  Monitor BP.   # Anemia of ESRD: Hemoglobin at goal, no ESA.   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   Subjective: Seen and examined in ICU.  Patient is clinically improved.  Denies nausea, vomiting, chest pain or shortness of breath.  His wife was present with him.  Discussed with ICU nurse and neuro team. Objective Vital signs in last 24 hours: Vitals:   09/13/23 1032 09/13/23 1044 09/13/23 1045 09/13/23 1048  BP:   (!) 164/87   Pulse: 75 76 77 76  Resp: 18 19 10 13   Temp:      TempSrc:      SpO2: 97% 97% 97% 98%  Weight:      Height:       Weight change:   Intake/Output Summary (Last 24 hours) at 09/13/2023 1126 Last data filed at 09/13/2023 0700 Gross per 24 hour  Intake 443.47 ml  Output 1300 ml  Net -856.53 ml       Labs: RENAL PANEL Recent Labs  Lab 09/11/23 2133 09/11/23 2136  NA 131* 132*  K 3.0* 3.1*  CL 92* 94*  CO2 29  --   GLUCOSE 161* 156*  BUN 28* 26*  CREATININE 4.15* 4.70*  CALCIUM 8.6*  --   ALBUMIN 3.6  --     Liver Function Tests: Recent Labs  Lab 09/11/23 2133  AST 20  ALT 16  ALKPHOS 98  BILITOT 0.5  PROT 6.9  ALBUMIN 3.6   No results for input(s): "LIPASE", "AMYLASE" in  the last 168 hours. No results for input(s): "AMMONIA" in the last 168 hours. CBC: Recent Labs    05/01/23 1024 08/19/23 1312 08/21/23 1321 09/11/23 2133 09/11/23 2136  HGB 10.0* 11.4* 12.1* 10.9* 11.2*  MCV 90 89 91 89.5  --     Cardiac Enzymes: No results for input(s): "CKTOTAL", "CKMB", "CKMBINDEX", "TROPONINI" in the last 168 hours. CBG: Recent Labs  Lab 09/11/23 2132 09/12/23 0105  GLUCAP 142* 116*    Iron Studies: No results for input(s): "IRON", "TIBC", "TRANSFERRIN", "FERRITIN" in the last 72 hours. Studies/Results: MR BRAIN WO CONTRAST  Result Date: 09/13/2023 CLINICAL DATA:  Acute neurologic deficit EXAM: MRI HEAD WITHOUT CONTRAST TECHNIQUE: Multiplanar, multiecho pulse sequences of the brain and surrounding structures were obtained without intravenous contrast. COMPARISON:  Head CT 09/11/2023 FINDINGS: Brain: There is a small area (1.4 x 1.0 cm) of intraparenchymal hemorrhage within the right postcentral gyrus with mild surrounding edema. There are small foci of abnormal diffusion restriction within the parietal white matter at the base of the postcentral gyrus. There is multifocal hyperintense T2-weighted signal within the periventricular and deep white matter. There is no midline shift or other mass effect. Normal CSF spaces. Vascular: Normal  flow voids. Skull and upper cervical spine: Normal marrow signal. Sinuses/Orbits: Negative. Other: None. IMPRESSION: 1. Small area of intraparenchymal hemorrhage within the right postcentral gyrus with mild surrounding edema. No mass effect or midline shift. 2. Small foci of acute ischemia within the parietal white matter at the base of the postcentral gyrus. These results were communicated to Dr. Erick Blinks at 1:36 am on 09/13/2023 by text page via the [facility] messaging system. Electronically Signed   By: Deatra Robinson M.D.   On: 09/13/2023 01:37   ECHOCARDIOGRAM COMPLETE  Result Date: 09/12/2023    ECHOCARDIOGRAM REPORT    Patient Name:   Jacob Farmer Date of Exam: 09/12/2023 Medical Rec #:  [id number]      Height:       70.0 in Accession #:    [id number]     Weight:       155.0 lb Date of Birth:  07-28-1946      BSA:          1.873 m Patient Age:    25 years       BP:           184/78 mmHg Patient Gender: M              HR:           76 bpm. Exam Location:  Inpatient Procedure: 2D Echo, Cardiac Doppler and Color Doppler Indications:    Stroke I63.9  History:        Patient has prior history of Echocardiogram examinations, most                 recent 07/03/2023. CAD, Stroke, Arrythmias:Atrial Fibrillation,                 Bradycardia and Tachycardia; Risk Factors:Hypertension, Diabetes                 and Dyslipidemia. ESRD.  Sonographer:    Lucendia Herrlich Referring Phys: [id number] [facility] KHALIQDINA IMPRESSIONS  1. Left ventricular ejection fraction, by estimation, is 60 to 65%. The left ventricle has normal function. The left ventricle demonstrates regional wall motion abnormalities (see scoring diagram/findings for description). Left ventricular diastolic parameters are consistent with Grade I diastolic dysfunction (impaired relaxation).  2. Right ventricular systolic function is normal. The right ventricular size is normal. There is normal pulmonary artery systolic pressure.  3. The mitral valve is normal in structure. No evidence of mitral valve regurgitation. No evidence of mitral stenosis.  4. The aortic valve is tricuspid. Aortic valve regurgitation is not visualized. No aortic stenosis is present.  5. Aortic dilatation noted. There is mild dilatation of the aortic root, measuring 41 mm.  6. The inferior vena cava is normal in size with greater than 50% respiratory variability, suggesting right atrial pressure of 3 mmHg. FINDINGS  Left Ventricle: Left ventricular ejection fraction, by estimation, is 60 to 65%. The left ventricle has normal function. The left ventricle demonstrates regional wall motion abnormalities. The left  ventricular internal cavity size was normal in size. There is no left ventricular hypertrophy. Left ventricular diastolic parameters are consistent with Grade I diastolic dysfunction (impaired relaxation). Indeterminate filling pressures.  LV Wall Scoring: The apex is akinetic. The apical lateral segment and apical anterior segment are hypokinetic. The anterior wall, antero-lateral wall, entire septum, entire inferior wall, and posterior wall are normal. Right Ventricle: The right ventricular size is normal. No increase in right ventricular wall thickness. Right ventricular systolic function is normal. There  is normal pulmonary artery systolic pressure. The tricuspid regurgitant velocity is 1.16 m/s, and  with an assumed right atrial pressure of 3 mmHg, the estimated right ventricular systolic pressure is 8.4 mmHg. Left Atrium: Left atrial size was normal in size. Right Atrium: Right atrial size was normal in size. Pericardium: There is no evidence of pericardial effusion. Mitral Valve: The mitral valve is normal in structure. No evidence of mitral valve regurgitation. No evidence of mitral valve stenosis. Tricuspid Valve: The tricuspid valve is normal in structure. Tricuspid valve regurgitation is trivial. No evidence of tricuspid stenosis. Aortic Valve: The aortic valve is tricuspid. Aortic valve regurgitation is not visualized. No aortic stenosis is present. Aortic valve peak gradient measures 4.6 mmHg. Pulmonic Valve: The pulmonic valve was normal in structure. Pulmonic valve regurgitation is not visualized. No evidence of pulmonic stenosis. Aorta: Aortic dilatation noted. There is mild dilatation of the aortic root, measuring 41 mm. Venous: The inferior vena cava is normal in size with greater than 50% respiratory variability, suggesting right atrial pressure of 3 mmHg. IAS/Shunts: No atrial level shunt detected by color flow Doppler.  LEFT VENTRICLE PLAX 2D LVIDd:         4.70 cm   Diastology LVIDs:          3.40 cm   LV e' medial:    5.52 cm/s LV PW:         1.00 cm   LV E/e' medial:  12.1 LV IVS:        1.00 cm   LV e' lateral:   5.70 cm/s LVOT diam:     2.10 cm   LV E/e' lateral: 11.7 LV SV:         51 LV SV Index:   27 LVOT Area:     3.46 cm  RIGHT VENTRICLE             IVC RV S prime:     12.90 cm/s  IVC diam: 1.10 cm TAPSE (M-mode): 1.8 cm LEFT ATRIUM             Index        RIGHT ATRIUM           Index LA diam:        3.10 cm 1.66 cm/m   RA Area:     12.70 cm LA Vol (A2C):   31.6 ml 16.87 ml/m  RA Volume:   25.70 ml  13.72 ml/m LA Vol (A4C):   25.8 ml 13.77 ml/m LA Biplane Vol: 31.2 ml 16.66 ml/m  AORTIC VALVE AV Area (Vmax): 2.58 cm AV Vmax:        107.00 cm/s AV Peak Grad:   4.6 mmHg LVOT Vmax:      79.73 cm/s LVOT Vmean:     48.033 cm/s LVOT VTI:       0.147 m  AORTA Ao Root diam: 4.10 cm Ao Asc diam:  3.40 cm MITRAL VALVE               TRICUSPID VALVE MV Area (PHT): 3.31 cm    TR Peak grad:   5.4 mmHg MV Decel Time: 229 msec    TR Vmax:        116.00 cm/s MV E velocity: 66.80 cm/s MV A velocity: 91.30 cm/s  SHUNTS MV E/A ratio:  0.73        Systemic VTI:  0.15 m  Systemic Diam: 2.10 cm Chilton Si MD Electronically signed by Chilton Si MD Signature Date/Time: 09/12/2023/3:45:09 PM    Final    CT ANGIO HEAD NECK W WO CM (CODE STROKE)  Result Date: 09/11/2023 CLINICAL DATA:  Initial evaluation for neuro deficit, stroke suspected, left upper extremity weakness. EXAM: CT ANGIOGRAPHY HEAD AND NECK WITH AND WITHOUT CONTRAST TECHNIQUE: Multidetector CT imaging of the head and neck was performed using the standard protocol during bolus administration of intravenous contrast. Multiplanar CT image reconstructions and MIPs were obtained to evaluate the vascular anatomy. Carotid stenosis measurements (when applicable) are obtained utilizing NASCET criteria, using the distal internal carotid diameter as the denominator. RADIATION DOSE REDUCTION: This exam was performed  according to the departmental dose-optimization program which includes automated exposure control, adjustment of the mA and/or kV according to patient size and/or use of iterative reconstruction technique. CONTRAST:  75mL OMNIPAQUE IOHEXOL 350 MG/ML SOLN COMPARISON:  CT from earlier the same day. FINDINGS: CTA NECK FINDINGS Aortic arch: Visualized aortic arch within normal limits for caliber. Severe atheromatous change about the arch and distal descending intrathoracic aorta with markedly irregular soft plaque about the aortic lumen. Superimposed multifocal penetrating plaques noted no high-grade stenosis about the origin the great vessels. Right carotid system: Right common and internal carotid arteries are patent without dissection. Atheromatous plaque about the right carotid bulb without hemodynamically significant greater than 50% stenosis. Left carotid system: Left common and internal carotid arteries are patent without dissection. Atheromatous plaque about the left carotid bulb/proximal left ICA with associated stenosis of up to approximate 50% by NASCET criteria. Vertebral arteries: Both vertebral arteries arise from subclavian arteries. No significant proximal subclavian artery stenosis. 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 Vertebral arteries are otherwise patent without stenosis or dissection. Skeleton: No worrisome osseous lesions. Moderate to advanced multilevel cervical spondylosis. Patient is edentulous. Other neck: No other acute finding. 2.3 cm sebaceous cyst noted within the lateral left neck. Right-sided central venous catheter in place. Upper chest: No other acute finding. Review of the MIP images confirms the above findings CTA HEAD FINDINGS Anterior circulation: Atheromatous change about the carotid siphons with associated moderate to severe stenoses at the para clinoid segments bilaterally. A1 segments patent bilaterally. Normal anterior communicating artery  complex. Anterior cerebral arteries patent without significant stenosis. No M1 stenosis or occlusion. No proximal MCA branch occlusion or high-grade stenosis. Distal MCA branches perfused and symmetric. Posterior circulation: Dominant right V4 segment patent without significant stenosis. Focal severe stenosis seen involving the mid-distal left V4 segment (series 6, image 282). Left PICA patent at its origin. Right PICA not well seen. Basilar patent without stenosis. Superior cerebellar and posterior cerebral arteries patent bilaterally. Venous sinuses: Patent allowing for timing the contrast bolus. Anatomic variants: None significant.  No aneurysm. Review of the MIP images confirms the above findings IMPRESSION: 1. Negative CTA for large vessel occlusion or other emergent finding. 2. Atheromatous plaque about the left carotid bulb/proximal left ICA with associated stenosis of up to approximate 50% by NASCET criteria. 3. 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 4. Severe atheromatous stenosis involving the mid-distal left V4 segment. 5. Atheromatous change about the carotid siphons with associated moderate to severe stenoses at the para clinoid segments bilaterally. 6. Severe atheromatous change about the aortic arch an proximal descending intrathoracic aorta with associated multifocal intimal irregularity and penetrating plaques. Aortic Atherosclerosis (ICD10-I70.0). Electronically Signed   By: Rise Mu M.D.   On:  09/11/2023 22:50   CT HEAD CODE STROKE WO CONTRAST  Result Date: 09/11/2023 CLINICAL DATA:  Code stroke. Neuro deficit, acute, stroke suspected. Left-sided weakness. EXAM: CT HEAD WITHOUT CONTRAST TECHNIQUE: Contiguous axial images were obtained from the base of the skull through the vertex without intravenous contrast. RADIATION DOSE REDUCTION: This exam was performed according to the departmental dose-optimization program which includes automated exposure  control, adjustment of the mA and/or kV according to patient size and/or use of iterative reconstruction technique. COMPARISON:  None Available. FINDINGS: Brain: No acute intracranial hemorrhage. Gray-white differentiation is preserved. No hydrocephalus or extra-axial collection. No mass effect or midline shift. Vascular: No hyperdense vessel or unexpected calcification. Skull: No calvarial fracture or suspicious bone lesion. Skull base is unremarkable. Sinuses/Orbits: No acute finding. Other: None. ASPECTS ([place] Stroke Program Early CT Score) - Ganglionic level infarction (caudate, lentiform nuclei, internal capsule, insula, M1-M3 cortex): 7 - Supraganglionic infarction (M4-M6 cortex): 3 Total score (0-10 with 10 being normal): 10 IMPRESSION: No acute intracranial hemorrhage or evidence of acute large vessel territory infarct. ASPECT score is 10. Code stroke imaging results were communicated on 09/11/2023 at 9:47 pm to provider Dr. Rhae Hammock via telephone, who verbally acknowledged these results. Electronically Signed   By: Orvan Falconer M.D.   On: 09/11/2023 21:47    Medications: Infusions:  clevidipine Stopped (09/12/23 1416)    Scheduled Medications:  allopurinol  100 mg Oral Daily   amLODipine  10 mg Oral Daily   calcitRIOL  0.25 mcg Oral Q M,W,F   Chlorhexidine Gluconate Cloth  6 each Topical Q0600   Chlorhexidine Gluconate Cloth  6 each Topical Q0600   folic acid  0.5 mg Oral Daily   hydrALAZINE  25 mg Oral Once   hydrALAZINE  50 mg Oral BID   leflunomide  10 mg Oral Daily   lidocaine  1 patch Transdermal Q24H   pantoprazole  40 mg Oral BID   rosuvastatin  40 mg Oral Daily   sevelamer carbonate  800 mg Oral BID WC    have reviewed scheduled and prn medications.  Physical Exam: General:NAD, comfortable Heart:RRR, s1s2 nl Lungs:clear b/l, no crackle Abdomen:soft, Non-tender, non-distended Extremities:No edema Dialysis Access: Right IJ TDC in place.  Jacob Heinle Elpidio Anis  Patryce Farmer 09/13/2023,11:26 AM  LOS: 2 days

## 2023-09-13 NOTE — Progress Notes (Addendum)
STROKE TEAM PROGRESS NOTE   BRIEF HPI [name] is a 77 y.o. male with history of MI, hypertension, hyperlipidemia, former smoker, end-stage renal disease on hemodialysis, atrial fibrillation not on anticoagulation due to bleeding ulcers presenting with sudden onset of left arm weakness and numbness as well as left facial droop.  Patient was given TNK to treat his stroke and has experienced improvement in symptoms since then.  He is awaiting MRI and will need dialysis resumed while he is here.  He is on a Monday Wednesday Friday schedule.   SIGNIFICANT HOSPITAL EVENTS 9/27 patient admitted and TNK given 9/28 - Hemorrhagic transformation, stay in ICU over night  INTERIM HISTORY/SUBJECTIVE Patient has been hemodynamically stable overnight, and his neurological exam is improved from admission.  He states that his symptoms have nearly resolved. MRI and CT head show hemorrhagic transformation/infarction of the right posterior frontal vertex. Vital signs stable.  Reported a little nausea, resolved with zofran.   OBJECTIVE  CBC    Component Value Date/Time   WBC 6.1 09/11/2023 2133   RBC 3.73 (L) 09/11/2023 2133   HGB 11.2 (L) 09/11/2023 2136   HGB 12.1 (L) 08/21/2023 1321   HCT 33.0 (L) 09/11/2023 2136   HCT 38.8 08/21/2023 1321   PLT 162 09/11/2023 2133   PLT 198 08/21/2023 1321   MCV 89.5 09/11/2023 2133   MCV 91 08/21/2023 1321   MCH 29.2 09/11/2023 2133   MCHC 32.6 09/11/2023 2133   RDW 14.5 09/11/2023 2133   RDW 13.1 08/21/2023 1321   LYMPHSABS 1.1 09/11/2023 2133   LYMPHSABS 1.4 08/21/2023 1321   MONOABS 0.6 09/11/2023 2133   EOSABS 0.3 09/11/2023 2133   EOSABS 0.3 08/21/2023 1321   BASOSABS 0.0 09/11/2023 2133   BASOSABS 0.0 08/21/2023 1321    BMET    Component Value Date/Time   NA 132 (L) 09/11/2023 2136   NA 138 08/21/2023 1321   K 3.1 (L) 09/11/2023 2136   CL 94 (L) 09/11/2023 2136   CO2 29 09/11/2023 2133   GLUCOSE 156 (H) 09/11/2023 2136   BUN 26 (H)  09/11/2023 2136   BUN 20 08/21/2023 1321   CREATININE 4.70 (H) 09/11/2023 2136   CREATININE 4.66 (H) 03/28/2023 1340   CALCIUM 8.6 (L) 09/11/2023 2133   EGFR 17 (L) 08/21/2023 1321   GFRNONAA 14 (L) 09/11/2023 2133   GFRNONAA 23 (L) 06/14/2021 1322    IMAGING past 24 hours MR BRAIN WO CONTRAST  Result Date: 09/13/2023 CLINICAL DATA:  Acute neurologic deficit EXAM: MRI HEAD WITHOUT CONTRAST TECHNIQUE: Multiplanar, multiecho pulse sequences of the brain and surrounding structures were obtained without intravenous contrast. COMPARISON:  Head CT 09/11/2023 FINDINGS: Brain: There is a small area (1.4 x 1.0 cm) of intraparenchymal hemorrhage within the right postcentral gyrus with mild surrounding edema. There are small foci of abnormal diffusion restriction within the parietal white matter at the base of the postcentral gyrus. There is multifocal hyperintense T2-weighted signal within the periventricular and deep white matter. There is no midline shift or other mass effect. Normal CSF spaces. Vascular: Normal flow voids. Skull and upper cervical spine: Normal marrow signal. Sinuses/Orbits: Negative. Other: None. IMPRESSION: 1. Small area of intraparenchymal hemorrhage within the right postcentral gyrus with mild surrounding edema. No mass effect or midline shift. 2. Small foci of acute ischemia within the parietal white matter at the base of the postcentral gyrus. These results were communicated to Dr. Erick Blinks at 1:36 am on 09/13/2023 by text page via  the [employer] system. Electronically Signed   By: Deatra Robinson M.D.   On: 09/13/2023 01:37   ECHOCARDIOGRAM COMPLETE  Result Date: 09/12/2023    ECHOCARDIOGRAM REPORT   Patient Name:   Jacob Farmer Date of Exam: 09/12/2023 Medical Rec #:  [id number]      Height:       70.0 in Accession #:    [id number]     Weight:       155.0 lb Date of Birth:  03/03/1946      BSA:          1.873 m Patient Age:    31 years       BP:           184/78 mmHg  Patient Gender: M              HR:           76 bpm. Exam Location:  Inpatient Procedure: 2D Echo, Cardiac Doppler and Color Doppler Indications:    Stroke I63.9  History:        Patient has prior history of Echocardiogram examinations, most                 recent 07/03/2023. CAD, Stroke, Arrythmias:Atrial Fibrillation,                 Bradycardia and Tachycardia; Risk Factors:Hypertension, Diabetes                 and Dyslipidemia. ESRD.  Sonographer:    Lucendia Herrlich Referring Phys: [id number] [facility] KHALIQDINA IMPRESSIONS  1. Left ventricular ejection fraction, by estimation, is 60 to 65%. The left ventricle has normal function. The left ventricle demonstrates regional wall motion abnormalities (see scoring diagram/findings for description). Left ventricular diastolic parameters are consistent with Grade I diastolic dysfunction (impaired relaxation).  2. Right ventricular systolic function is normal. The right ventricular size is normal. There is normal pulmonary artery systolic pressure.  3. The mitral valve is normal in structure. No evidence of mitral valve regurgitation. No evidence of mitral stenosis.  4. The aortic valve is tricuspid. Aortic valve regurgitation is not visualized. No aortic stenosis is present.  5. Aortic dilatation noted. There is mild dilatation of the aortic root, measuring 41 mm.  6. The inferior vena cava is normal in size with greater than 50% respiratory variability, suggesting right atrial pressure of 3 mmHg. FINDINGS  Left Ventricle: Left ventricular ejection fraction, by estimation, is 60 to 65%. The left ventricle has normal function. The left ventricle demonstrates regional wall motion abnormalities. The left ventricular internal cavity size was normal in size. There is no left ventricular hypertrophy. Left ventricular diastolic parameters are consistent with Grade I diastolic dysfunction (impaired relaxation). Indeterminate filling pressures.  LV Wall Scoring: The apex is  akinetic. The apical lateral segment and apical anterior segment are hypokinetic. The anterior wall, antero-lateral wall, entire septum, entire inferior wall, and posterior wall are normal. Right Ventricle: The right ventricular size is normal. No increase in right ventricular wall thickness. Right ventricular systolic function is normal. There is normal pulmonary artery systolic pressure. The tricuspid regurgitant velocity is 1.16 m/s, and  with an assumed right atrial pressure of 3 mmHg, the estimated right ventricular systolic pressure is 8.4 mmHg. Left Atrium: Left atrial size was normal in size. Right Atrium: Right atrial size was normal in size. Pericardium: There is no evidence of pericardial effusion. Mitral Valve: The mitral valve is normal in structure. No evidence of  mitral valve regurgitation. No evidence of mitral valve stenosis. Tricuspid Valve: The tricuspid valve is normal in structure. Tricuspid valve regurgitation is trivial. No evidence of tricuspid stenosis. Aortic Valve: The aortic valve is tricuspid. Aortic valve regurgitation is not visualized. No aortic stenosis is present. Aortic valve peak gradient measures 4.6 mmHg. Pulmonic Valve: The pulmonic valve was normal in structure. Pulmonic valve regurgitation is not visualized. No evidence of pulmonic stenosis. Aorta: Aortic dilatation noted. There is mild dilatation of the aortic root, measuring 41 mm. Venous: The inferior vena cava is normal in size with greater than 50% respiratory variability, suggesting right atrial pressure of 3 mmHg. IAS/Shunts: No atrial level shunt detected by color flow Doppler.  LEFT VENTRICLE PLAX 2D LVIDd:         4.70 cm   Diastology LVIDs:         3.40 cm   LV e' medial:    5.52 cm/s LV PW:         1.00 cm   LV E/e' medial:  12.1 LV IVS:        1.00 cm   LV e' lateral:   5.70 cm/s LVOT diam:     2.10 cm   LV E/e' lateral: 11.7 LV SV:         51 LV SV Index:   27 LVOT Area:     3.46 cm  RIGHT VENTRICLE              IVC RV S prime:     12.90 cm/s  IVC diam: 1.10 cm TAPSE (M-mode): 1.8 cm LEFT ATRIUM             Index        RIGHT ATRIUM           Index LA diam:        3.10 cm 1.66 cm/m   RA Area:     12.70 cm LA Vol (A2C):   31.6 ml 16.87 ml/m  RA Volume:   25.70 ml  13.72 ml/m LA Vol (A4C):   25.8 ml 13.77 ml/m LA Biplane Vol: 31.2 ml 16.66 ml/m  AORTIC VALVE AV Area (Vmax): 2.58 cm AV Vmax:        107.00 cm/s AV Peak Grad:   4.6 mmHg LVOT Vmax:      79.73 cm/s LVOT Vmean:     48.033 cm/s LVOT VTI:       0.147 m  AORTA Ao Root diam: 4.10 cm Ao Asc diam:  3.40 cm MITRAL VALVE               TRICUSPID VALVE MV Area (PHT): 3.31 cm    TR Peak grad:   5.4 mmHg MV Decel Time: 229 msec    TR Vmax:        116.00 cm/s MV E velocity: 66.80 cm/s MV A velocity: 91.30 cm/s  SHUNTS MV E/A ratio:  0.73        Systemic VTI:  0.15 m                            Systemic Diam: 2.10 cm Chilton Si MD Electronically signed by Chilton Si MD Signature Date/Time: 09/12/2023/3:45:09 PM    Final     Vitals:   09/13/23 0600 09/13/23 0615 09/13/23 0636 09/13/23 0700  BP: (!) 148/85 (!) 157/88 (!) 178/81 (!) 154/75  Pulse: 77 77  81  Resp: 17 20  17   Temp:  97.7 F (36.5 C)  TempSrc:    Oral  SpO2: 98% 99%  98%  Weight:      Height:         PHYSICAL EXAM General:  Alert, well-nourished, well-developed patient in no acute distress Psych:  Mood and affect appropriate for situation CV: Regular rate and rhythm on monitor Respiratory:  Regular, unlabored respirations on room air GI: Abdomen soft and nontender   NEURO:  Mental Status: AA&Ox3, patient is able to give clear and coherent history Speech/Language: speech is without dysarthria or aphasia.    Cranial Nerves:  II: PERRL. Visual fields full.  III, IV, VI: EOMI. Eyelids elevate symmetrically.  V: Sensation is intact to light touch and symmetrical to face.  VII: Slight left facial droop VIII: hearing intact to voice. IX, X: Phonation is normal.  XII:  tongue is midline without fasciculations. Motor: 5/5 strength to all muscle groups tested.  Slight grip weakness on the left and diminished fine finger movements of the left hand, right hand orbits left Tone: is normal and bulk is normal Sensation- Intact to light touch bilaterally. Extinction absent to light touch to DSS.   Coordination: FTN intact bilaterally.  No drift.  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 ASPECTS 10.    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 No mass effect or midline shift. Small foci of acute ischemia within the right parietal white matter at the base of the postcentral gyrus. Repeat CT Head -  Compared to the MRI of yesterday, no change in the appearance of a 2.4 x 1.7 cm hemorrhagic infarction in the right posterior frontal vertex is suspected. Very small amount of adjacent subarachnoid hemorrhage 2D Echo EF 60-65% with Grade 1 diastolic dysfunction LDL 41 HgbA1c 5.3 VTE prophylaxis - heparin subq No antithrombotic prior to admission, now on No antithrombotic due to HT Therapy recommendations: CIR Disposition: Pending  Atrial fibrillation Home Meds: No anticoagulation due to history of bleeding ulcers and severe GI bleed Continue telemetry monitoring Per cardiology note, they are considering of Watchman device if pt is able to tolerate short term AC Recommend to continue follow up with cardiology regarding watchman device option  Recent GIB Admitted twice in 04/2023 for GIB and severe anemia, required 4U PRBC infusion Had EGD no acute finding 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  Pt was not on antiplatelet or anticoagulation since then May need to discuss with his GI Dr. Marguerita Merles [phone] regarding antiplatelet use.   Hypertension Home meds: Amlodipine 10 mg daily, hydralazine 25 mg twice daily Stable Now on norvasc 10 and hydralazine 50 bid Blood pressure goal < 160 Long term BP goal normotensive  Hyperlipidemia Home meds: Rosuvastatin 40 mg daily, resumed in hospital LDL 41, goal < 70 Continue statin at discharge  End-stage renal disease on dialysis Patient has been on dialysis for about 3 months, has Vas-Cath On Monday Wednesday Friday hemodialysis schedule Nephrology consulted, appreciate recommendations  Other  Stroke Risk Factors Advanced age   Other Active Problems Rheumatoid arthritis Diarrhea - C diff test pending  Hospital day # 2  Patient seen and examined by NP/APP with MD. MD to update note as needed.   Elmer Picker, DNP, FNP-BC [facility] Pager: [phone]  ATTENDING NOTE: I reviewed above note and agree with the assessment and plan. Pt was seen and examined.   Wife and RN are at the bedside. Pt awake, alert, eyes open, orientated to age, place, time and people. No aphasia, fluent language, following all simple commands. Able to name and repeat. No gaze palsy, tracking bilaterally, visual field full, PERRL. Mild left facial droop. Tongue midline. LUE drift with 3+/5 proximal, 4/5 bicep and tricep and 4+/5 finger grip. BLEs 4+/5 except left ankle DF 4/5. Sensation symmetrical bilaterally, R FTN intact, but L FTN ataxia more than weakness can explain, gait not tested.  Pt stroke likely due to afib not on AC. He is not on antiplatelet either given his GIB and severe anemia in 04/2023. Cardiology note indicated possible  watchman device. He will need continued follow up with them. However, at meantime, will discuss with his GI doc to see if he can be placed on any antiplatelet. Now hold off antithrombotics due to HT. Continue statin. BP goal < 160, resume home meds and increased hydralazine dose. PT and OT recommend CIR.   For detailed assessment and plan, please refer to above/below as I have made changes wherever appropriate.   Marvel Plan, MD PhD Stroke Neurology 09/13/2023 9:51 PM  This patient is critically ill due to hemorrhagic infarct s/p TNK, afib not on AC, GIB, diarrhea and at significant risk of neurological worsening, death form bleeding from TNK, stroke, recurrent GIB, anemia, C diff. This patient's care requires constant monitoring of vital signs, hemodynamics, respiratory and cardiac monitoring, review of multiple databases, neurological assessment, discussion with family, other specialists and medical decision making of high complexity. I spent 45 minutes of neurocritical care time in the care of this patient. I had long discussion with pt and wife at bedside, updated pt current condition, treatment plan and potential prognosis, and answered all the questions. They expressed understanding and appreciation.      To contact Stroke Continuity provider, please refer to [email or web address]. After hours, contact General Neurology

## 2023-09-14 DIAGNOSIS — I639 Cerebral infarction, unspecified: Secondary | ICD-10-CM | POA: Diagnosis not present

## 2023-09-14 LAB — COMPREHENSIVE METABOLIC PANEL
ALT: 25 U/L (ref 0–44)
AST: 86 U/L — ABNORMAL HIGH (ref 15–41)
Albumin: 3.1 g/dL — ABNORMAL LOW (ref 3.5–5.0)
Alkaline Phosphatase: 77 U/L (ref 38–126)
Anion gap: 13 (ref 5–15)
BUN: 42 mg/dL — ABNORMAL HIGH (ref 8–23)
CO2: 25 mmol/L (ref 22–32)
Calcium: 9.1 mg/dL (ref 8.9–10.3)
Chloride: 95 mmol/L — ABNORMAL LOW (ref 98–111)
Creatinine, Ser: 5.58 mg/dL — ABNORMAL HIGH (ref 0.61–1.24)
GFR, Estimated: 10 mL/min — ABNORMAL LOW (ref >60)
Glucose, Bld: 131 mg/dL — ABNORMAL HIGH (ref 70–99)
Potassium: 3.4 mmol/L — ABNORMAL LOW (ref 3.5–5.1)
Sodium: 133 mmol/L — ABNORMAL LOW (ref 135–145)
Total Bilirubin: 0.8 mg/dL (ref 0.3–1.2)
Total Protein: 6.3 g/dL — ABNORMAL LOW (ref 6.5–8.1)

## 2023-09-14 LAB — MAGNESIUM: Magnesium: 2.2 mg/dL (ref 1.7–2.4)

## 2023-09-14 LAB — PHOSPHORUS: Phosphorus: 3.9 mg/dL (ref 2.5–4.6)

## 2023-09-14 MED ORDER — CHLORHEXIDINE GLUCONATE CLOTH 2 % EX PADS
6.0000 | MEDICATED_PAD | Freq: Every day | CUTANEOUS | Status: DC
  Administered 2023-09-14 – 2023-09-16 (×3): 6 via TOPICAL

## 2023-09-14 MED ORDER — LOPERAMIDE HCL 2 MG PO CAPS: 2.0000 mg | ORAL_CAPSULE | Freq: Four times a day (QID) | ORAL | Status: DC | PRN

## 2023-09-14 MED ORDER — ENSURE ENLIVE PO LIQD
237.0000 mL | Freq: Two times a day (BID) | ORAL | Status: DC
  Administered 2023-09-14 – 2023-09-15 (×3): 237 mL via ORAL

## 2023-09-14 NOTE — Discharge Summary (Shared)
Stroke Discharge Summary  Patient ID: Jacob Farmer   MRN: [id number]      DOB: [birth year]  Date of Admission: 09/11/2023 Date of Discharge: 09/14/2023  Attending Physician:  Stroke, Md, MD Consultant(s):   Treatment Team:  Maxie Barb, MD  nephrology   Patient's PCP:  Anabel Halon, MD  DISCHARGE PRIMARY DIAGNOSIS:  Stroke - hemorrhagic infarction of the right posterior frontal vertex s/p TNK, etiology likely due to afib not on [facility]   Patient Active Problem List   Diagnosis Date Noted   Stroke determined by clinical assessment (HCC) 09/12/2023   Acute CVA (cerebrovascular accident) (HCC) 09/11/2023   Ischemic ulcer (HCC) 08/11/2023   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   Prostate cancer (HCC) 08/07/2023   Jejunal ulcer 06/02/2023   Abnormal ultrasound of abdomen 06/02/2023   Acute blood loss anemia 04/19/2023   Anemia due to stage 4 chronic kidney disease (HCC) 03/25/2023   Iron deficiency anemia 03/25/2023   Hospital discharge follow-up 02/26/2023   ABLA (acute blood loss anemia) 02/26/2023   Acquired thrombophilia (HCC) 02/13/2023   Generalized weakness 02/13/2023   Acute bronchitis 02/13/2023   Drug-induced liver injury 02/08/2023   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   Bleeding from right ear 12/25/2022   Chronic heart failure with preserved ejection fraction (HCC) 07/22/2022   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   Dizziness 04/09/2022   Encounter for general adult medical examination with abnormal findings 01/25/2022   Paroxysmal atrial fibrillation (HCC) 01/22/2022   Allergic dermatitis 09/12/2021   Herniated cervical disc 05/26/2017    Class: Acute   Cervical spinal stenosis 05/26/2017   Primary osteoarthritis of both hands 02/04/2017   History of gout 02/04/2017   DDD (degenerative disc disease), lumbar 02/04/2017   High risk medication use 01/28/2017   History of prostate cancer 01/28/2017   Chronic kidney disease,  stage 4 (severe) (HCC) 12/27/2011   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   Rheumatoid arthritis (HCC) 01/04/2011   SINUS BRADYCARDIA 01/12/2010   Coronary artery disease involving native coronary artery of native heart without angina pectoris 01/12/2010   Hyperlipidemia 01/08/2010   Essential hypertension 01/08/2010     Secondary Diagnoses: Hypertension Hyperlipidemia End-stage renal disease on dialysis Atrial fibrillation   Allergies as of 09/14/2023   No Known Allergies   Med Rec must be completed prior to using this SMARTLINK***       LABORATORY STUDIES CBC    Component Value Date/Time   WBC 6.1 09/11/2023 2133   RBC 3.73 (L) 09/11/2023 2133   HGB 11.2 (L) 09/11/2023 2136   HGB 12.1 (L) 08/21/2023 1321   HCT 33.0 (L) 09/11/2023 2136   HCT 38.8 08/21/2023 1321   PLT 162 09/11/2023 2133   PLT 198 08/21/2023 1321   MCV 89.5 09/11/2023 2133   MCV 91 08/21/2023 1321   MCH 29.2 09/11/2023 2133   MCHC 32.6 09/11/2023 2133   RDW 14.5 09/11/2023 2133   RDW 13.1 08/21/2023 1321   LYMPHSABS 1.1 09/11/2023 2133   LYMPHSABS 1.4 08/21/2023 1321   MONOABS 0.6 09/11/2023 2133   EOSABS 0.3 09/11/2023 2133   EOSABS 0.3 08/21/2023 1321   BASOSABS 0.0 09/11/2023 2133   BASOSABS 0.0 08/21/2023 1321   CMP    Component Value Date/Time   NA 133 (L) 09/14/2023 0407   NA 138 08/21/2023 1321   K 3.4 (L) 09/14/2023 0407   CL 95 (L) 09/14/2023 0407  Stroke Discharge Summary  Patient ID: Jacob Farmer   MRN: [id number]      DOB: [birth year]  Date of Admission: 09/11/2023 Date of Discharge: 09/14/2023  Attending Physician:  Stroke, Md, MD Consultant(s):   Treatment Team:  Maxie Barb, MD  nephrology   Patient's PCP:  Anabel Halon, MD  DISCHARGE PRIMARY DIAGNOSIS:  Stroke - hemorrhagic infarction of the right posterior frontal vertex s/p TNK, etiology likely due to afib not on [facility]   Patient Active Problem List   Diagnosis Date Noted   Stroke determined by clinical assessment (HCC) 09/12/2023   Acute CVA (cerebrovascular accident) (HCC) 09/11/2023   Ischemic ulcer (HCC) 08/11/2023   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   Prostate cancer (HCC) 08/07/2023   Jejunal ulcer 06/02/2023   Abnormal ultrasound of abdomen 06/02/2023   Acute blood loss anemia 04/19/2023   Anemia due to stage 4 chronic kidney disease (HCC) 03/25/2023   Iron deficiency anemia 03/25/2023   Hospital discharge follow-up 02/26/2023   ABLA (acute blood loss anemia) 02/26/2023   Acquired thrombophilia (HCC) 02/13/2023   Generalized weakness 02/13/2023   Acute bronchitis 02/13/2023   Drug-induced liver injury 02/08/2023   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   Bleeding from right ear 12/25/2022   Chronic heart failure with preserved ejection fraction (HCC) 07/22/2022   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   Dizziness 04/09/2022   Encounter for general adult medical examination with abnormal findings 01/25/2022   Paroxysmal atrial fibrillation (HCC) 01/22/2022   Allergic dermatitis 09/12/2021   Herniated cervical disc 05/26/2017    Class: Acute   Cervical spinal stenosis 05/26/2017   Primary osteoarthritis of both hands 02/04/2017   History of gout 02/04/2017   DDD (degenerative disc disease), lumbar 02/04/2017   High risk medication use 01/28/2017   History of prostate cancer 01/28/2017   Chronic kidney disease,  stage 4 (severe) (HCC) 12/27/2011   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   Rheumatoid arthritis (HCC) 01/04/2011   SINUS BRADYCARDIA 01/12/2010   Coronary artery disease involving native coronary artery of native heart without angina pectoris 01/12/2010   Hyperlipidemia 01/08/2010   Essential hypertension 01/08/2010     Secondary Diagnoses: Hypertension Hyperlipidemia End-stage renal disease on dialysis Atrial fibrillation   Allergies as of 09/14/2023   No Known Allergies   Med Rec must be completed prior to using this SMARTLINK***       LABORATORY STUDIES CBC    Component Value Date/Time   WBC 6.1 09/11/2023 2133   RBC 3.73 (L) 09/11/2023 2133   HGB 11.2 (L) 09/11/2023 2136   HGB 12.1 (L) 08/21/2023 1321   HCT 33.0 (L) 09/11/2023 2136   HCT 38.8 08/21/2023 1321   PLT 162 09/11/2023 2133   PLT 198 08/21/2023 1321   MCV 89.5 09/11/2023 2133   MCV 91 08/21/2023 1321   MCH 29.2 09/11/2023 2133   MCHC 32.6 09/11/2023 2133   RDW 14.5 09/11/2023 2133   RDW 13.1 08/21/2023 1321   LYMPHSABS 1.1 09/11/2023 2133   LYMPHSABS 1.4 08/21/2023 1321   MONOABS 0.6 09/11/2023 2133   EOSABS 0.3 09/11/2023 2133   EOSABS 0.3 08/21/2023 1321   BASOSABS 0.0 09/11/2023 2133   BASOSABS 0.0 08/21/2023 1321   CMP    Component Value Date/Time   NA 133 (L) 09/14/2023 0407   NA 138 08/21/2023 1321   K 3.4 (L) 09/14/2023 0407   CL 95 (L) 09/14/2023 0407  Stroke Discharge Summary  Patient ID: Jacob Farmer   MRN: [id number]      DOB: [birth year]  Date of Admission: 09/11/2023 Date of Discharge: 09/14/2023  Attending Physician:  Stroke, Md, MD Consultant(s):   Treatment Team:  Maxie Barb, MD  nephrology   Patient's PCP:  Anabel Halon, MD  DISCHARGE PRIMARY DIAGNOSIS:  Stroke - hemorrhagic infarction of the right posterior frontal vertex s/p TNK, etiology likely due to afib not on [facility]   Patient Active Problem List   Diagnosis Date Noted   Stroke determined by clinical assessment (HCC) 09/12/2023   Acute CVA (cerebrovascular accident) (HCC) 09/11/2023   Ischemic ulcer (HCC) 08/11/2023   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   Prostate cancer (HCC) 08/07/2023   Jejunal ulcer 06/02/2023   Abnormal ultrasound of abdomen 06/02/2023   Acute blood loss anemia 04/19/2023   Anemia due to stage 4 chronic kidney disease (HCC) 03/25/2023   Iron deficiency anemia 03/25/2023   Hospital discharge follow-up 02/26/2023   ABLA (acute blood loss anemia) 02/26/2023   Acquired thrombophilia (HCC) 02/13/2023   Generalized weakness 02/13/2023   Acute bronchitis 02/13/2023   Drug-induced liver injury 02/08/2023   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   Bleeding from right ear 12/25/2022   Chronic heart failure with preserved ejection fraction (HCC) 07/22/2022   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   Dizziness 04/09/2022   Encounter for general adult medical examination with abnormal findings 01/25/2022   Paroxysmal atrial fibrillation (HCC) 01/22/2022   Allergic dermatitis 09/12/2021   Herniated cervical disc 05/26/2017    Class: Acute   Cervical spinal stenosis 05/26/2017   Primary osteoarthritis of both hands 02/04/2017   History of gout 02/04/2017   DDD (degenerative disc disease), lumbar 02/04/2017   High risk medication use 01/28/2017   History of prostate cancer 01/28/2017   Chronic kidney disease,  stage 4 (severe) (HCC) 12/27/2011   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   Rheumatoid arthritis (HCC) 01/04/2011   SINUS BRADYCARDIA 01/12/2010   Coronary artery disease involving native coronary artery of native heart without angina pectoris 01/12/2010   Hyperlipidemia 01/08/2010   Essential hypertension 01/08/2010     Secondary Diagnoses: Hypertension Hyperlipidemia End-stage renal disease on dialysis Atrial fibrillation   Allergies as of 09/14/2023   No Known Allergies   Med Rec must be completed prior to using this SMARTLINK***       LABORATORY STUDIES CBC    Component Value Date/Time   WBC 6.1 09/11/2023 2133   RBC 3.73 (L) 09/11/2023 2133   HGB 11.2 (L) 09/11/2023 2136   HGB 12.1 (L) 08/21/2023 1321   HCT 33.0 (L) 09/11/2023 2136   HCT 38.8 08/21/2023 1321   PLT 162 09/11/2023 2133   PLT 198 08/21/2023 1321   MCV 89.5 09/11/2023 2133   MCV 91 08/21/2023 1321   MCH 29.2 09/11/2023 2133   MCHC 32.6 09/11/2023 2133   RDW 14.5 09/11/2023 2133   RDW 13.1 08/21/2023 1321   LYMPHSABS 1.1 09/11/2023 2133   LYMPHSABS 1.4 08/21/2023 1321   MONOABS 0.6 09/11/2023 2133   EOSABS 0.3 09/11/2023 2133   EOSABS 0.3 08/21/2023 1321   BASOSABS 0.0 09/11/2023 2133   BASOSABS 0.0 08/21/2023 1321   CMP    Component Value Date/Time   NA 133 (L) 09/14/2023 0407   NA 138 08/21/2023 1321   K 3.4 (L) 09/14/2023 0407   CL 95 (L) 09/14/2023 0407  Stroke Discharge Summary  Patient ID: Jacob Farmer   MRN: [id number]      DOB: [birth year]  Date of Admission: 09/11/2023 Date of Discharge: 09/14/2023  Attending Physician:  Stroke, Md, MD Consultant(s):   Treatment Team:  Maxie Barb, MD  nephrology   Patient's PCP:  Anabel Halon, MD  DISCHARGE PRIMARY DIAGNOSIS:  Stroke - hemorrhagic infarction of the right posterior frontal vertex s/p TNK, etiology likely due to afib not on [facility]   Patient Active Problem List   Diagnosis Date Noted   Stroke determined by clinical assessment (HCC) 09/12/2023   Acute CVA (cerebrovascular accident) (HCC) 09/11/2023   Ischemic ulcer (HCC) 08/11/2023   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   Prostate cancer (HCC) 08/07/2023   Jejunal ulcer 06/02/2023   Abnormal ultrasound of abdomen 06/02/2023   Acute blood loss anemia 04/19/2023   Anemia due to stage 4 chronic kidney disease (HCC) 03/25/2023   Iron deficiency anemia 03/25/2023   Hospital discharge follow-up 02/26/2023   ABLA (acute blood loss anemia) 02/26/2023   Acquired thrombophilia (HCC) 02/13/2023   Generalized weakness 02/13/2023   Acute bronchitis 02/13/2023   Drug-induced liver injury 02/08/2023   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   Bleeding from right ear 12/25/2022   Chronic heart failure with preserved ejection fraction (HCC) 07/22/2022   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   Dizziness 04/09/2022   Encounter for general adult medical examination with abnormal findings 01/25/2022   Paroxysmal atrial fibrillation (HCC) 01/22/2022   Allergic dermatitis 09/12/2021   Herniated cervical disc 05/26/2017    Class: Acute   Cervical spinal stenosis 05/26/2017   Primary osteoarthritis of both hands 02/04/2017   History of gout 02/04/2017   DDD (degenerative disc disease), lumbar 02/04/2017   High risk medication use 01/28/2017   History of prostate cancer 01/28/2017   Chronic kidney disease,  stage 4 (severe) (HCC) 12/27/2011   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   Rheumatoid arthritis (HCC) 01/04/2011   SINUS BRADYCARDIA 01/12/2010   Coronary artery disease involving native coronary artery of native heart without angina pectoris 01/12/2010   Hyperlipidemia 01/08/2010   Essential hypertension 01/08/2010     Secondary Diagnoses: Hypertension Hyperlipidemia End-stage renal disease on dialysis Atrial fibrillation   Allergies as of 09/14/2023   No Known Allergies   Med Rec must be completed prior to using this SMARTLINK***       LABORATORY STUDIES CBC    Component Value Date/Time   WBC 6.1 09/11/2023 2133   RBC 3.73 (L) 09/11/2023 2133   HGB 11.2 (L) 09/11/2023 2136   HGB 12.1 (L) 08/21/2023 1321   HCT 33.0 (L) 09/11/2023 2136   HCT 38.8 08/21/2023 1321   PLT 162 09/11/2023 2133   PLT 198 08/21/2023 1321   MCV 89.5 09/11/2023 2133   MCV 91 08/21/2023 1321   MCH 29.2 09/11/2023 2133   MCHC 32.6 09/11/2023 2133   RDW 14.5 09/11/2023 2133   RDW 13.1 08/21/2023 1321   LYMPHSABS 1.1 09/11/2023 2133   LYMPHSABS 1.4 08/21/2023 1321   MONOABS 0.6 09/11/2023 2133   EOSABS 0.3 09/11/2023 2133   EOSABS 0.3 08/21/2023 1321   BASOSABS 0.0 09/11/2023 2133   BASOSABS 0.0 08/21/2023 1321   CMP    Component Value Date/Time   NA 133 (L) 09/14/2023 0407   NA 138 08/21/2023 1321   K 3.4 (L) 09/14/2023 0407   CL 95 (L) 09/14/2023 0407  the base of the postcentral gyrus. There is multifocal hyperintense T2-weighted signal within the periventricular and deep white matter. There is no midline shift or other mass effect. Normal CSF spaces. Vascular: Normal flow voids. Skull and upper cervical spine: Normal marrow signal. Sinuses/Orbits: Negative. Other: None. IMPRESSION: 1. Small area of intraparenchymal hemorrhage within the right postcentral gyrus with mild surrounding edema. No mass effect or midline shift. 2. Small foci of acute ischemia within the parietal white matter at the base of the postcentral gyrus. These results were communicated to Dr. Erick Blinks at 1:36 am on 09/13/2023 by text page via the [facility] messaging system. Electronically Signed   By: Deatra Robinson M.D.   On: 09/13/2023 01:37   ECHOCARDIOGRAM  COMPLETE  Result Date: 09/12/2023    ECHOCARDIOGRAM REPORT   Patient Name:   Jacob SIEFKER Date of Exam: 09/12/2023 Medical Rec #:  [id number]      Height:       70.0 in Accession #:    [id number]     Weight:       155.0 lb Date of Birth:  1946-04-10      BSA:          1.873 m Patient Age:    77 years       BP:           184/78 mmHg Patient Gender: M              HR:           76 bpm. Exam Location:  Inpatient Procedure: 2D Echo, Cardiac Doppler and Color Doppler Indications:    Stroke I63.9  History:        Patient has prior history of Echocardiogram examinations, most                 recent 07/03/2023. CAD, Stroke, Arrythmias:Atrial Fibrillation,                 Bradycardia and Tachycardia; Risk Factors:Hypertension, Diabetes                 and Dyslipidemia. ESRD.  Sonographer:    Lucendia Herrlich Referring Phys: [id number] [facility] KHAL[place]DINA IMPRESSIONS  1. Left ventricular ejection fraction, by estimation, is 60 to 65%. The left ventricle has normal function. The left ventricle demonstrates regional wall motion abnormalities (see scoring diagram/findings for description). Left ventricular diastolic parameters are consistent with Grade I diastolic dysfunction (impaired relaxation).  2. Right ventricular systolic function is normal. The right ventricular size is normal. There is normal pulmonary artery systolic pressure.  3. The mitral valve is normal in structure. No evidence of mitral valve regurgitation. No evidence of mitral stenosis.  4. The aortic valve is tricuspid. Aortic valve regurgitation is not visualized. No aortic stenosis is present.  5. Aortic dilatation noted. There is mild dilatation of the aortic root, measuring 41 mm.  6. The inferior vena cava is normal in size with greater than 50% respiratory variability, suggesting right atrial pressure of 3 mmHg. FINDINGS  Left Ventricle: Left ventricular ejection fraction, by estimation, is 60 to 65%. The left ventricle has normal function. The left  ventricle demonstrates regional wall motion abnormalities. The left ventricular internal cavity size was normal in size. There is no left ventricular hypertrophy. Left ventricular diastolic parameters are consistent with Grade I diastolic dysfunction (impaired relaxation). Indeterminate filling pressures.  LV Wall Scoring: The apex is akinetic. The apical lateral segment and apical anterior segment are hypokinetic. The anterior wall, antero-lateral  Stroke Discharge Summary  Patient ID: Jacob Farmer   MRN: [id number]      DOB: [birth year]  Date of Admission: 09/11/2023 Date of Discharge: 09/14/2023  Attending Physician:  Stroke, Md, MD Consultant(s):   Treatment Team:  Maxie Barb, MD  nephrology   Patient's PCP:  Anabel Halon, MD  DISCHARGE PRIMARY DIAGNOSIS:  Stroke - hemorrhagic infarction of the right posterior frontal vertex s/p TNK, etiology likely due to afib not on [facility]   Patient Active Problem List   Diagnosis Date Noted   Stroke determined by clinical assessment (HCC) 09/12/2023   Acute CVA (cerebrovascular accident) (HCC) 09/11/2023   Ischemic ulcer (HCC) 08/11/2023   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   Prostate cancer (HCC) 08/07/2023   Jejunal ulcer 06/02/2023   Abnormal ultrasound of abdomen 06/02/2023   Acute blood loss anemia 04/19/2023   Anemia due to stage 4 chronic kidney disease (HCC) 03/25/2023   Iron deficiency anemia 03/25/2023   Hospital discharge follow-up 02/26/2023   ABLA (acute blood loss anemia) 02/26/2023   Acquired thrombophilia (HCC) 02/13/2023   Generalized weakness 02/13/2023   Acute bronchitis 02/13/2023   Drug-induced liver injury 02/08/2023   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   Bleeding from right ear 12/25/2022   Chronic heart failure with preserved ejection fraction (HCC) 07/22/2022   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   Dizziness 04/09/2022   Encounter for general adult medical examination with abnormal findings 01/25/2022   Paroxysmal atrial fibrillation (HCC) 01/22/2022   Allergic dermatitis 09/12/2021   Herniated cervical disc 05/26/2017    Class: Acute   Cervical spinal stenosis 05/26/2017   Primary osteoarthritis of both hands 02/04/2017   History of gout 02/04/2017   DDD (degenerative disc disease), lumbar 02/04/2017   High risk medication use 01/28/2017   History of prostate cancer 01/28/2017   Chronic kidney disease,  stage 4 (severe) (HCC) 12/27/2011   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   Rheumatoid arthritis (HCC) 01/04/2011   SINUS BRADYCARDIA 01/12/2010   Coronary artery disease involving native coronary artery of native heart without angina pectoris 01/12/2010   Hyperlipidemia 01/08/2010   Essential hypertension 01/08/2010     Secondary Diagnoses: Hypertension Hyperlipidemia End-stage renal disease on dialysis Atrial fibrillation   Allergies as of 09/14/2023   No Known Allergies   Med Rec must be completed prior to using this SMARTLINK***       LABORATORY STUDIES CBC    Component Value Date/Time   WBC 6.1 09/11/2023 2133   RBC 3.73 (L) 09/11/2023 2133   HGB 11.2 (L) 09/11/2023 2136   HGB 12.1 (L) 08/21/2023 1321   HCT 33.0 (L) 09/11/2023 2136   HCT 38.8 08/21/2023 1321   PLT 162 09/11/2023 2133   PLT 198 08/21/2023 1321   MCV 89.5 09/11/2023 2133   MCV 91 08/21/2023 1321   MCH 29.2 09/11/2023 2133   MCHC 32.6 09/11/2023 2133   RDW 14.5 09/11/2023 2133   RDW 13.1 08/21/2023 1321   LYMPHSABS 1.1 09/11/2023 2133   LYMPHSABS 1.4 08/21/2023 1321   MONOABS 0.6 09/11/2023 2133   EOSABS 0.3 09/11/2023 2133   EOSABS 0.3 08/21/2023 1321   BASOSABS 0.0 09/11/2023 2133   BASOSABS 0.0 08/21/2023 1321   CMP    Component Value Date/Time   NA 133 (L) 09/14/2023 0407   NA 138 08/21/2023 1321   K 3.4 (L) 09/14/2023 0407   CL 95 (L) 09/14/2023 0407  the base of the postcentral gyrus. There is multifocal hyperintense T2-weighted signal within the periventricular and deep white matter. There is no midline shift or other mass effect. Normal CSF spaces. Vascular: Normal flow voids. Skull and upper cervical spine: Normal marrow signal. Sinuses/Orbits: Negative. Other: None. IMPRESSION: 1. Small area of intraparenchymal hemorrhage within the right postcentral gyrus with mild surrounding edema. No mass effect or midline shift. 2. Small foci of acute ischemia within the parietal white matter at the base of the postcentral gyrus. These results were communicated to Dr. Erick Blinks at 1:36 am on 09/13/2023 by text page via the [facility] messaging system. Electronically Signed   By: Deatra Robinson M.D.   On: 09/13/2023 01:37   ECHOCARDIOGRAM  COMPLETE  Result Date: 09/12/2023    ECHOCARDIOGRAM REPORT   Patient Name:   Jacob SIEFKER Date of Exam: 09/12/2023 Medical Rec #:  [id number]      Height:       70.0 in Accession #:    [id number]     Weight:       155.0 lb Date of Birth:  1946-04-10      BSA:          1.873 m Patient Age:    77 years       BP:           184/78 mmHg Patient Gender: M              HR:           76 bpm. Exam Location:  Inpatient Procedure: 2D Echo, Cardiac Doppler and Color Doppler Indications:    Stroke I63.9  History:        Patient has prior history of Echocardiogram examinations, most                 recent 07/03/2023. CAD, Stroke, Arrythmias:Atrial Fibrillation,                 Bradycardia and Tachycardia; Risk Factors:Hypertension, Diabetes                 and Dyslipidemia. ESRD.  Sonographer:    Lucendia Herrlich Referring Phys: [id number] [facility] [place]HAL[place] IMPRESSIONS  1. Left ventricular ejection fraction, by estimation, is 60 to 65%. The left ventricle has normal function. The left ventricle demonstrates regional wall motion abnormalities (see scoring diagram/findings for description). Left ventricular diastolic parameters are consistent with Grade I diastolic dysfunction (impaired relaxation).  2. Right ventricular systolic function is normal. The right ventricular size is normal. There is normal pulmonary artery systolic pressure.  3. The mitral valve is normal in structure. No evidence of mitral valve regurgitation. No evidence of mitral stenosis.  4. The aortic valve is tricuspid. Aortic valve regurgitation is not visualized. No aortic stenosis is present.  5. Aortic dilatation noted. There is mild dilatation of the aortic root, measuring 41 mm.  6. The inferior vena cava is normal in size with greater than 50% respiratory variability, suggesting right atrial pressure of 3 mmHg. FINDINGS  Left Ventricle: Left ventricular ejection fraction, by estimation, is 60 to 65%. The left ventricle has normal function. The left  ventricle demonstrates regional wall motion abnormalities. The left ventricular internal cavity size was normal in size. There is no left ventricular hypertrophy. Left ventricular diastolic parameters are consistent with Grade I diastolic dysfunction (impaired relaxation). Indeterminate filling pressures.  LV Wall Scoring: The apex is akinetic. The apical lateral segment and apical anterior segment are hypokinetic. The anterior wall, antero-lateral

## 2023-09-14 NOTE — Progress Notes (Signed)
Pt transferred to [id number] without complications. Wife at beside.

## 2023-09-14 NOTE — Progress Notes (Signed)
[facility] NEPHROLOGY PROGRESS NOTE  Assessment/ Plan: Pt is a 77 y.o. yo male  ith past medical history significant for hypertension, HLD, CAD, Psoriatec arthritis, A-fib not on anticoagulation because of bleeding ulcer, ESRD on HD who was presented with acute stroke status post tPA. For ESRD:MWF at [facility] in [place]. RIJ TDC for the access.   # Acute ischemic stroke status post tPA: Cardioembolic in the setting of A-fib, off of anticoagulation.  Stroke team is following and managing.   # ESRD: MWF schedule.  Plan for next HD on Monday.  Volume status acceptable.   # Hypertension: Resume home antihypertensives, monitor BP. Avoid hypotensive episode.  Monitor BP.   # Anemia of ESRD: Hemoglobin at goal, no ESA.   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  # Hypokalemia, adjust potassium and dialysis bath.  Subjective: Seen and examined in ICU.  Off of IV drip.  Denies nausea, vomiting, chest pain or shortness of breath.  His wife was presented with him.  Objective Vital signs in last 24 hours: Vitals:   09/14/23 0600 09/14/23 0700 09/14/23 0734 09/14/23 0800  BP: (!) 142/63 (!) 147/72  128/69  Pulse: 86 79  86  Resp: 14 16  19   Temp:   97.9 F (36.6 C)   TempSrc:   Oral   SpO2: 97% 97%  97%  Weight:      Height:       Weight change:  No intake or output data in the 24 hours ending 09/14/23 1003      Labs: RENAL PANEL Recent Labs  Lab 09/11/23 2133 09/11/23 2136 09/14/23 0407  NA 131* 132* 133*  K 3.0* 3.1* 3.4*  CL 92* 94* 95*  CO2 29  --  25  GLUCOSE 161* 156* 131*  BUN 28* 26* 42*  CREATININE 4.15* 4.70* 5.58*  CALCIUM 8.6*  --  9.1  MG  --   --  2.2  PHOS  --   --  3.9  ALBUMIN 3.6  --  3.1*    Liver Function Tests: Recent Labs  Lab 09/11/23 2133 09/14/23 0407  AST 20 86*  ALT 16 25  ALKPHOS 98 77  BILITOT 0.5 0.8  PROT 6.9 6.3*  ALBUMIN 3.6 3.1*   No results for input(s): "LIPASE", "AMYLASE" in  the last 168 hours. No results for input(s): "AMMONIA" in the last 168 hours. CBC: Recent Labs    05/01/23 1024 08/19/23 1312 08/21/23 1321 09/11/23 2133 09/11/23 2136  HGB 10.0* 11.4* 12.1* 10.9* 11.2*  MCV 90 89 91 89.5  --     Cardiac Enzymes: No results for input(s): "CKTOTAL", "CKMB", "CKMBINDEX", "TROPONINI" in the last 168 hours. CBG: Recent Labs  Lab 09/11/23 2132 09/12/23 0105  GLUCAP 142* 116*    Iron Studies: No results for input(s): "IRON", "TIBC", "TRANSFERRIN", "FERRITIN" in the last 72 hours. Studies/Results: CT HEAD WO CONTRAST ( )  Result Date: 09/13/2023 CLINICAL DATA:  Follow-up stroke. Acute neurological deficit. Intraparenchymal hemorrhage right postcentral gyrus. Stroke in the right parietal white matter. EXAM: CT HEAD WITHOUT CONTRAST TECHNIQUE: Contiguous axial images were obtained from the base of the skull through the vertex without intravenous contrast. RADIATION DOSE REDUCTION: This exam was performed according to the departmental dose-optimization program which includes automated exposure control, adjustment of the mA and/or kV according to patient size and/or use of iterative reconstruction technique. COMPARISON:  MRI 09/12/2023 and CT 09/11/2023 FINDINGS: Brain: Probable artifactual low-density in the pons. No focal  cerebellar finding. Cerebral hemispheres show age related atrophy and chronic small-vessel ischemic change. Hemorrhagic infarction in the right posterior frontal vertex appears similar to the MRI study, with the hemorrhage measuring proximally 2.4 x 1.7 cm in size. Very small amount of adjacent subarachnoid hemorrhage within a sulcus. Other small near by right frontoparietal cortical infarctions are not visible by CT. No mass effect. No subdural hemorrhage. No hydrocephalus. Vascular: There is atherosclerotic calcification of the major vessels at the base of the brain. Skull: Negative Sinuses/Orbits: Clear/normal Other: None IMPRESSION: 1.  Compared to the MRI of yesterday, no change in the appearance of a 2.4 x 1.7 cm hemorrhagic infarction in the right posterior frontal vertex is suspected. Very small amount of adjacent subarachnoid hemorrhage. No mass effect. 2. Other small near by right frontoparietal cortical infarctions shown by MRI are not visible by CT. 3. Age related atrophy and chronic small-vessel ischemic change of the cerebral hemispheric white matter. Electronically Signed   By: Paulina Fusi M.D.   On: 09/13/2023 14:56   MR BRAIN WO CONTRAST  Result Date: 09/13/2023 CLINICAL DATA:  Acute neurologic deficit EXAM: MRI HEAD WITHOUT CONTRAST TECHNIQUE: Multiplanar, multiecho pulse sequences of the brain and surrounding structures were obtained without intravenous contrast. COMPARISON:  Head CT 09/11/2023 FINDINGS: Brain: There is a small area (1.4 x 1.0 cm) of intraparenchymal hemorrhage within the right postcentral gyrus with mild surrounding edema. There are small foci of abnormal diffusion restriction within the parietal white matter at the base of the postcentral gyrus. There is multifocal hyperintense T2-weighted signal within the periventricular and deep white matter. There is no midline shift or other mass effect. Normal CSF spaces. Vascular: Normal flow voids. Skull and upper cervical spine: Normal marrow signal. Sinuses/Orbits: Negative. Other: None. IMPRESSION: 1. Small area of intraparenchymal hemorrhage within the right postcentral gyrus with mild surrounding edema. No mass effect or midline shift. 2. Small foci of acute ischemia within the parietal white matter at the base of the postcentral gyrus. These results were communicated to Dr. Erick Blinks at 1:36 am on 09/13/2023 by text page via the [facility] messaging system. Electronically Signed   By: Deatra Robinson M.D.   On: 09/13/2023 01:37   ECHOCARDIOGRAM COMPLETE  Result Date: 09/12/2023    ECHOCARDIOGRAM REPORT   Patient Name:   Jacob [facility] Date of Exam: 09/12/2023  Medical Rec #:  [id number]      Height:       70.0 in Accession #:    [id number]     Weight:       155.0 lb Date of Birth:  12/30/1945      BSA:          1.873 m Patient Age:    77 years       BP:           184/78 mmHg Patient Gender: M              HR:           76 bpm. Exam Location:  Inpatient Procedure: 2D Echo, Cardiac Doppler and Color Doppler Indications:    Stroke I63.9  History:        Patient has prior history of Echocardiogram examinations, most                 recent 07/03/2023. CAD, Stroke, Arrythmias:Atrial Fibrillation,                 Bradycardia and Tachycardia; Risk Factors:Hypertension, Diabetes  and Dyslipidemia. ESRD.  Sonographer:    Lucendia Herrlich Referring Phys: [id number] [facility] KHALIQDINA IMPRESSIONS  1. Left ventricular ejection fraction, by estimation, is 60 to 65%. The left ventricle has normal function. The left ventricle demonstrates regional wall motion abnormalities (see scoring diagram/findings for description). Left ventricular diastolic parameters are consistent with Grade I diastolic dysfunction (impaired relaxation).  2. Right ventricular systolic function is normal. The right ventricular size is normal. There is normal pulmonary artery systolic pressure.  3. The mitral valve is normal in structure. No evidence of mitral valve regurgitation. No evidence of mitral stenosis.  4. The aortic valve is tricuspid. Aortic valve regurgitation is not visualized. No aortic stenosis is present.  5. Aortic dilatation noted. There is mild dilatation of the aortic root, measuring 41 mm.  6. The inferior vena cava is normal in size with greater than 50% respiratory variability, suggesting right atrial pressure of 3 mmHg. FINDINGS  Left Ventricle: Left ventricular ejection fraction, by estimation, is 60 to 65%. The left ventricle has normal function. The left ventricle demonstrates regional wall motion abnormalities. The left ventricular internal cavity size was normal in size. There  is no left ventricular hypertrophy. Left ventricular diastolic parameters are consistent with Grade I diastolic dysfunction (impaired relaxation). Indeterminate filling pressures.  LV Wall Scoring: The apex is akinetic. The apical lateral segment and apical anterior segment are hypokinetic. The anterior wall, antero-lateral wall, entire septum, entire inferior wall, and posterior wall are normal. Right Ventricle: The right ventricular size is normal. No increase in right ventricular wall thickness. Right ventricular systolic function is normal. There is normal pulmonary artery systolic pressure. The tricuspid regurgitant velocity is 1.16 m/s, and  with an assumed right atrial pressure of 3 mmHg, the estimated right ventricular systolic pressure is 8.4 mmHg. Left Atrium: Left atrial size was normal in size. Right Atrium: Right atrial size was normal in size. Pericardium: There is no evidence of pericardial effusion. Mitral Valve: The mitral valve is normal in structure. No evidence of mitral valve regurgitation. No evidence of mitral valve stenosis. Tricuspid Valve: The tricuspid valve is normal in structure. Tricuspid valve regurgitation is trivial. No evidence of tricuspid stenosis. Aortic Valve: The aortic valve is tricuspid. Aortic valve regurgitation is not visualized. No aortic stenosis is present. Aortic valve peak gradient measures 4.6 mmHg. Pulmonic Valve: The pulmonic valve was normal in structure. Pulmonic valve regurgitation is not visualized. No evidence of pulmonic stenosis. Aorta: Aortic dilatation noted. There is mild dilatation of the aortic root, measuring 41 mm. Venous: The inferior vena cava is normal in size with greater than 50% respiratory variability, suggesting right atrial pressure of 3 mmHg. IAS/Shunts: No atrial level shunt detected by color flow Doppler.  LEFT VENTRICLE PLAX 2D LVIDd:         4.70 cm   Diastology LVIDs:         3.40 cm   LV e' medial:    5.52 cm/s LV PW:         1.00 cm    LV E/e' medial:  12.1 LV IVS:        1.00 cm   LV e' lateral:   5.70 cm/s LVOT diam:     2.10 cm   LV E/e' lateral: 11.7 LV SV:         51 LV SV Index:   27 LVOT Area:     3.46 cm  RIGHT VENTRICLE             IVC  RV S prime:     12.90 cm/s  IVC diam: 1.10 cm TAPSE (M-mode): 1.8 cm LEFT ATRIUM             Index        RIGHT ATRIUM           Index LA diam:        3.10 cm 1.66 cm/m   RA Area:     12.70 cm LA Vol (A2C):   31.6 ml 16.87 ml/m  RA Volume:   25.70 ml  13.72 ml/m LA Vol (A4C):   25.8 ml 13.77 ml/m LA Biplane Vol: 31.2 ml 16.66 ml/m  AORTIC VALVE AV Area (Vmax): 2.58 cm AV Vmax:        107.00 cm/s AV Peak Grad:   4.6 mmHg LVOT Vmax:      79.73 cm/s LVOT Vmean:     48.033 cm/s LVOT VTI:       0.147 m  AORTA Ao Root diam: 4.10 cm Ao Asc diam:  3.40 cm MITRAL VALVE               TRICUSPID VALVE MV Area (PHT): 3.31 cm    TR Peak grad:   5.4 mmHg MV Decel Time: 229 msec    TR Vmax:        116.00 cm/s MV E velocity: 66.80 cm/s MV A velocity: 91.30 cm/s  SHUNTS MV E/A ratio:  0.73        Systemic VTI:  0.15 m                            Systemic Diam: 2.10 cm Chilton Si MD Electronically signed by Chilton Si MD Signature Date/Time: 09/12/2023/3:45:09 PM    Final     Medications: Infusions:  clevidipine Stopped (09/12/23 1416)    Scheduled Medications:  allopurinol  100 mg Oral Daily   amLODipine  10 mg Oral Daily   calcitRIOL  0.25 mcg Oral Q M,W,F   Chlorhexidine Gluconate Cloth  6 each Topical Q0600   Chlorhexidine Gluconate Cloth  6 each Topical Q0600   folic acid  0.5 mg Oral Daily   heparin injection (subcutaneous)  5,000 Units Subcutaneous Q8H   hydrALAZINE  50 mg Oral BID   leflunomide  10 mg Oral Daily   lidocaine  1 patch Transdermal Q24H   pantoprazole  40 mg Oral BID   rosuvastatin  40 mg Oral Daily   sevelamer carbonate  800 mg Oral BID WC    have reviewed scheduled and prn medications.  Physical Exam: General:NAD, comfortable Heart:RRR, s1s2 nl Lungs:clear  b/l, no crackle Abdomen:soft, Non-tender, non-distended Extremities:No edema Dialysis Access: Right IJ TDC in place.  Evett Kassa Elpidio Anis Julia Kulzer 09/14/2023,10:03 AM  LOS: 3 days

## 2023-09-14 NOTE — Plan of Care (Signed)
  Problem: Ischemic Stroke/TIA Tissue Perfusion: Goal: Complications of ischemic stroke/TIA will be minimized Outcome: Progressing   Problem: Coping: Goal: Will verbalize positive feelings about self Outcome: Progressing Goal: Will identify appropriate support needs Outcome: Progressing   Problem: Self-Care: Goal: Ability to participate in self-care as condition permits will improve Outcome: Progressing

## 2023-09-14 NOTE — Plan of Care (Signed)

## 2023-09-14 NOTE — Progress Notes (Addendum)
STROKE TEAM PROGRESS NOTE   BRIEF HPI [name] is a 77 y.o. male with history of MI, hypertension, hyperlipidemia, former smoker, end-stage renal disease on hemodialysis, atrial fibrillation not on anticoagulation due to bleeding ulcers presenting with sudden onset of left arm weakness and numbness as well as left facial droop.  Patient was given TNK to treat his stroke and has experienced improvement in symptoms since then.  He is awaiting MRI and will need dialysis resumed while he is here.  He is on a Monday Wednesday Friday schedule.   SIGNIFICANT HOSPITAL EVENTS 9/27 patient admitted and TNK given 9/28 - Hemorrhagic transformation, stay in ICU over night 9/29 transferred out of ICU  INTERIM HISTORY/SUBJECTIVE Patient has been hemodynamically stable overnight, and his neurological exam is stable.  He is stable and ready to be transferred out of the ICU today.  OBJECTIVE  CBC    Component Value Date/Time   WBC 6.1 09/11/2023 2133   RBC 3.73 (L) 09/11/2023 2133   HGB 11.2 (L) 09/11/2023 2136   HGB 12.1 (L) 08/21/2023 1321   HCT 33.0 (L) 09/11/2023 2136   HCT 38.8 08/21/2023 1321   PLT 162 09/11/2023 2133   PLT 198 08/21/2023 1321   MCV 89.5 09/11/2023 2133   MCV 91 08/21/2023 1321   MCH 29.2 09/11/2023 2133   MCHC 32.6 09/11/2023 2133   RDW 14.5 09/11/2023 2133   RDW 13.1 08/21/2023 1321   LYMPHSABS 1.1 09/11/2023 2133   LYMPHSABS 1.4 08/21/2023 1321   MONOABS 0.6 09/11/2023 2133   EOSABS 0.3 09/11/2023 2133   EOSABS 0.3 08/21/2023 1321   BASOSABS 0.0 09/11/2023 2133   BASOSABS 0.0 08/21/2023 1321    BMET    Component Value Date/Time   NA 133 (L) 09/14/2023 0407   NA 138 08/21/2023 1321   K 3.4 (L) 09/14/2023 0407   CL 95 (L) 09/14/2023 0407   CO2 25 09/14/2023 0407   GLUCOSE 131 (H) 09/14/2023 0407   BUN 42 (H) 09/14/2023 0407   BUN 20 08/21/2023 1321   CREATININE 5.58 (H) 09/14/2023 0407   CREATININE 4.66 (H) 03/28/2023 1340   CALCIUM 9.1  09/14/2023 0407   EGFR 17 (L) 08/21/2023 1321   GFRNONAA 10 (L) 09/14/2023 0407   GFRNONAA 23 (L) 06/14/2021 1322    IMAGING past 24 hours No results found.  Vitals:   09/14/23 0900 09/14/23 1000 09/14/23 1100 09/14/23 1110  BP: (!) 145/69 (!) 146/72 (!) 144/76   Pulse: 79 76 78   Resp: 14 15 16    Temp:    98.8 F (37.1 C)  TempSrc:    Oral  SpO2: 98% 97% 98%   Weight:      Height:         PHYSICAL EXAM General:  Alert, well-nourished, well-developed patient in no acute distress Psych:  Mood and affect appropriate for situation CV: Regular rate and rhythm on monitor Respiratory:  Regular, unlabored respirations on room air GI: Abdomen soft and nontender   NEURO:  Mental Status: AA&Ox3, patient is able to give clear and coherent history Speech/Language: speech is without dysarthria or aphasia.    Cranial Nerves:  II: PERRL. Visual fields full.  III, IV, VI: EOMI. Eyelids elevate symmetrically.  V: Sensation is intact to light touch and symmetrical to face.  VII: Slight left facial droop VIII: hearing intact to voice. IX, X: Phonation is normal.  XII: tongue is midline without fasciculations. Motor: 5/5 strength to all muscle groups tested.  Slight grip weakness on the left  Tone: is normal and bulk is normal Sensation- Intact to light touch bilaterally.   Coordination: FTN intact bilaterally.  No drift.  Gait- deferred   ASSESSMENT/PLAN  Stroke - hemorrhagic infarction of the right posterior frontal vertex s/p TNK, etiology likely due to afib not on [facility] Code Stroke CT head No acute abnormality. ASPECTS 10.    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 No mass effect or midline shift. Small foci of acute ischemia within the right parietal white  matter at the base of the postcentral gyrus. Repeat CT Head -  Compared to the MRI of yesterday, no change in the appearance of a 2.4 x 1.7 cm hemorrhagic infarction in the right posterior frontal vertex is suspected. Very small amount of adjacent subarachnoid hemorrhage 2D Echo EF 60-65% with Grade 1 diastolic dysfunction LDL 41 HgbA1c 5.3 VTE prophylaxis - heparin subq No antithrombotic prior to admission, now on No antithrombotic due to HT Therapy recommendations: CIR Disposition: Pending  Atrial fibrillation Home Meds: No anticoagulation due to history of bleeding ulcers and severe GI bleed Continue telemetry monitoring Per cardiology note, they are considering of Watchman device if pt is able to tolerate short term AC Recommend to continue follow up with cardiology regarding watchman device option  Recent GIB Admitted twice in 04/2023 for GIB and severe anemia, required 4U PRBC infusion Had EGD no acute finding capsule endoscopy performed 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  Pt was not on antiplatelet or anticoagulation since then May need to discuss with his GI Dr. Marguerita Merles [phone] regarding antiplatelet use.   Hypertension Home meds: Amlodipine 10 mg daily, hydralazine 25 mg twice daily Stable Now on norvasc 10 and hydralazine 50 bid Blood pressure goal < 160 Long term BP goal normotensive  Hyperlipidemia Home meds: Rosuvastatin 40 mg daily, resumed in hospital LDL 41, goal < 70 Continue statin at discharge  End-stage renal disease on dialysis Patient has been on dialysis for about 3 months, has Vas-Cath On Monday Wednesday Friday hemodialysis schedule Nephrology consulted, appreciate recommendations  Other Stroke Risk Factors Advanced age  Other Active Problems Rheumatoid  arthritis Diarrhea - C diff test negative - on loperamide PRN Nutrition - decreased appetite - add ensure supplement  Hospital day # 3  Patient seen and examined by NP/APP with MD. MD to update note as needed.   Cortney E Ernestina Columbia , MSN, AGACNP-BC [facility] See Amion for schedule and pager information 09/14/2023 11:48 AM   ATTENDING NOTE: I reviewed above note and agree with the assessment and plan. Pt was seen and examined.   Wife at the bedside. Pt lying in bed, no acute event overnight. No complains. Neuro stable. Yesterday had several loose stools but C diff negative. Today had one small stool, will add loperamide PRN. He had decreased appetite and afraid of diarrhea, will add ensure supplement. BP stable now, on po BP meds. Will need to discuss with GI and cardiology regarding antithrombotic regimen and potential watchman device. Pending HD tomorrow. PT and OT recommend CIR.   For detailed assessment and plan, please  refer to above/below as I have made changes wherever appropriate.   Marvel Plan, MD PhD Stroke Neurology 09/14/2023 12:02 PM  This patient is critically ill due to hemorrhagic infarct s/p TNK, afib not on AC, GIB, diarrhea and at significant risk of neurological worsening, death form bleeding from TNK, stroke, recurrent GIB, anemia, C diff. This patient's care requires constant monitoring of vital signs, hemodynamics, respiratory and cardiac monitoring, review of multiple databases, neurological assessment, discussion with family, other specialists and medical decision making of high complexity. I spent 35 minutes of neurocritical care time in the care of this patient. I had long discussion with pt and wife at bedside, updated pt current condition, treatment plan and potential prognosis, and answered all the questions. They expressed understanding and appreciation.   To contact Stroke Continuity provider, please refer to [email or web address]. After hours, contact  General Neurology

## 2023-09-14 NOTE — Progress Notes (Signed)
Inpatient Rehab Admissions Coordinator:  ? ?Per therapy recommendations,  patient was screened for CIR candidacy by Devaney Segers, MS, CCC-SLP. At this time, Pt. Appears to be a a potential candidate for CIR. I will place   order for rehab consult per protocol for full assessment. Please contact me any with questions. ? ?Trine Fread, MS, CCC-SLP ?Rehab Admissions Coordinator  ?[phone] (celll) ?[phone] (office) ? ?

## 2023-09-15 DIAGNOSIS — G8194 Hemiplegia, unspecified affecting left nondominant side: Secondary | ICD-10-CM

## 2023-09-15 DIAGNOSIS — I639 Cerebral infarction, unspecified: Secondary | ICD-10-CM | POA: Diagnosis not present

## 2023-09-15 LAB — BASIC METABOLIC PANEL
Anion gap: 17 — ABNORMAL HIGH (ref 5–15)
BUN: 66 mg/dL — ABNORMAL HIGH (ref 8–23)
CO2: 23 mmol/L (ref 22–32)
Calcium: 8.6 mg/dL — ABNORMAL LOW (ref 8.9–10.3)
Chloride: 94 mmol/L — ABNORMAL LOW (ref 98–111)
Creatinine, Ser: 8.33 mg/dL — ABNORMAL HIGH (ref 0.61–1.24)
GFR, Estimated: 6 mL/min — ABNORMAL LOW (ref >60)
Glucose, Bld: 107 mg/dL — ABNORMAL HIGH (ref 70–99)
Potassium: 3.9 mmol/L (ref 3.5–5.1)
Sodium: 134 mmol/L — ABNORMAL LOW (ref 135–145)

## 2023-09-15 LAB — CBC
HCT: 29.6 % — ABNORMAL LOW (ref 39.0–52.0)
Hemoglobin: 9.7 g/dL — ABNORMAL LOW (ref 13.0–17.0)
MCH: 29.6 pg (ref 26.0–34.0)
MCHC: 32.8 g/dL (ref 30.0–36.0)
MCV: 90.2 fL (ref 80.0–100.0)
Platelets: 175 10*3/uL (ref 150–400)
RBC: 3.28 MIL/uL — ABNORMAL LOW (ref 4.22–5.81)
RDW: 14.8 % (ref 11.5–15.5)
WBC: 13.6 10*3/uL — ABNORMAL HIGH (ref 4.0–10.5)
nRBC: 0 % (ref 0.0–0.2)

## 2023-09-15 MED ORDER — LOPERAMIDE HCL 2 MG PO CAPS
2.0000 mg | ORAL_CAPSULE | Freq: Three times a day (TID) | ORAL | Status: DC
  Administered 2023-09-15 – 2023-09-16 (×3): 2 mg via ORAL
  Filled 2023-09-15 (×3): qty 1

## 2023-09-15 MED ORDER — ASPIRIN 81 MG PO TBEC
81.0000 mg | DELAYED_RELEASE_TABLET | Freq: Every day | ORAL | Status: DC
  Administered 2023-09-15 – 2023-09-16 (×2): 81 mg via ORAL
  Filled 2023-09-15 (×2): qty 1

## 2023-09-15 NOTE — TOC CM/SW Note (Signed)
Transition of Care [facility]) - Inpatient Brief Assessment   Patient Details  Name: Jacob Farmer MRN: [id number] Date of Birth: April 13, 1946  Transition of Care [facility] CM/SW Contact:    Kermit Balo, RN Phone Number: 09/15/2023, 12:56 PM   Clinical Narrative:  CIR to begin insurance auth once therapy notes are entered.   Transition of Care Asessment: Insurance and Status: Insurance coverage has been reviewed Patient has primary care physician: Yes Home environment has been reviewed: home with spouse   Prior/Current Home Services: No current home services Social Determinants of Health Reivew: SDOH reviewed no interventions necessary Readmission risk has been reviewed: Yes Transition of care needs: transition of care needs identified, TOC will continue to follow

## 2023-09-15 NOTE — Progress Notes (Signed)
[facility] Progress Note   Subjective:   Patient seen and examined in room.  Wife at bedside. Feeling better today, nausea improved.  Tolerated chicken broth yesterday, appetite decreased since coming to the hospital.  Denies CP, SOB, abdominal pain and n/v/d.  Reports he was scheduled to have permanent access placed tomorrow but now had to cancel.   Objective Vitals:   09/14/23 2335 09/15/23 0335 09/15/23 0752 09/15/23 1151  BP: (!) 144/72 134/65 (!) 144/70 136/61  Pulse: 77 76 73 67  Resp: 16 17 16 18   Temp: 98.6 F (37 C) 97.9 F (36.6 C) 97.9 F (36.6 C) 98.1 F (36.7 C)  TempSrc: Oral Oral Oral Oral  SpO2: 100% 100% 100% 98%  Weight:      Height:       Physical Exam General:alert, elderly male in NAD Heart:RRR, no mrg Lungs:CTAB, nml WOB on RA Abdomen:soft, +tenderness, ND Extremities:no LE edema Dialysis Access: [facility]   Filed Weights   09/11/23 2139  Weight: 70.3 kg    Intake/Output Summary (Last 24 hours) at 09/15/2023 1327 Last data filed at 09/14/2023 1400 Gross per 24 hour  Intake 237 ml  Output --  Net 237 ml    Additional Objective Labs: Basic Metabolic Panel: Recent Labs  Lab 09/11/23 2133 09/11/23 2136 09/14/23 0407 09/15/23 0738  NA 131* 132* 133* 134*  K 3.0* 3.1* 3.4* 3.9  CL 92* 94* 95* 94*  CO2 29  --  25 23  GLUCOSE 161* 156* 131* 107*  BUN 28* 26* 42* 66*  CREATININE 4.15* 4.70* 5.58* 8.33*  CALCIUM 8.6*  --  9.1 8.6*  PHOS  --   --  3.9  --    Liver Function Tests: Recent Labs  Lab 09/11/23 2133 09/14/23 0407  AST 20 86*  ALT 16 25  ALKPHOS 98 77  BILITOT 0.5 0.8  PROT 6.9 6.3*  ALBUMIN 3.6 3.1*    CBC: Recent Labs  Lab 09/11/23 2133 09/11/23 2136 09/15/23 0738  WBC 6.1  --  13.6*  NEUTROABS 4.1  --   --   HGB 10.9* 11.2* 9.7*  HCT 33.4* 33.0* 29.6*  MCV 89.5  --  90.2  PLT 162  --  175   CBG: Recent Labs  Lab 09/11/23 2132 09/12/23 0105  GLUCAP 142* 116*   Medications:  clevidipine  Stopped (09/12/23 1416)    allopurinol  100 mg Oral Daily   amLODipine  10 mg Oral Daily   aspirin EC  81 mg Oral Daily   calcitRIOL  0.25 mcg Oral Q M,W,F   Chlorhexidine Gluconate Cloth  6 each Topical Q0600   Chlorhexidine Gluconate Cloth  6 each Topical Q0600   feeding supplement  237 mL Oral BID BM   folic acid  0.5 mg Oral Daily   heparin injection (subcutaneous)  5,000 Units Subcutaneous Q8H   hydrALAZINE  50 mg Oral BID   leflunomide  10 mg Oral Daily   lidocaine  1 patch Transdermal Q24H   pantoprazole  40 mg Oral BID   rosuvastatin  40 mg Oral Daily   sevelamer carbonate  800 mg Oral BID WC    Dialysis Orders: [facility] in [place]. RIJ TDC for the access.  MWF  Assessment/Plan: 1. 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 Cardioembolic in setting of A fib most likely per neuro. Off anticoagulation. Going to CIR. 2. ESRD -on HD MWF.  Plan for HD today per  regular schedule.  NO HEPARIN.  3. Anemia of CKD- Hgb 9.7. Follow trends, may need to start ESA.   4. Secondary hyperparathyroidism - Ca and phos at goal. Continue renvela when eating. 5. HTN/volume - BP ok. Home antihypertensive's restarted. Avoid hypotension. Does not appear volume overloaded, UF as tolerated 6. Nutrition - Renal diet w/fluid restrictions.   Virgina Norfolk, PA-C [facility] 09/15/2023,1:27 PM  LOS: 4 days

## 2023-09-15 NOTE — Plan of Care (Signed)

## 2023-09-15 NOTE — Progress Notes (Addendum)
Physical Therapy Treatment Patient Details Name: Jacob Farmer MRN: [id number] DOB: December 05, 1946 Today's Date: 09/15/2023   History of Present Illness 77 y.o. male admitted 9/26 with sudden LUD weakness and Lt facial droop s/p TNK. PMHx: MI, HTN, HLD, former smoker, ESRD on HD, PAF, CAD, prostate CA    PT Comments  Pt seen for PT tx with wife present for session, pt agreeable. Pt is able to complete supine>sit with supervision with use of hospital bed features. Session focused on gait with various ADs - SPC vs RW. Pt is self limiting with gait distances 2/2 decreased balance, fatigue, & incontinent BM. Will continue to follow pt acutely to address balance, endurance, L NMR, & gait with LRAD.  Notified nurse of redness noted on pt's buttocks.   If plan is discharge home, recommend the following: A little help with walking and/or transfers;A little help with bathing/dressing/bathroom;Assistance with cooking/housework;Assist for transportation;Help with stairs or ramp for entrance   Can travel by private vehicle        Equipment Recommendations  None recommended by PT (defer to next venue)    Recommendations for Other Services Rehab consult     Precautions / Restrictions Precautions Precautions: Fall Precaution Comments: L hemiparesis Restrictions Weight Bearing Restrictions: No     Mobility  Bed Mobility Overal bed mobility: Needs Assistance Bed Mobility: Supine to Sit     Supine to sit: Supervision, HOB elevated, Used rails          Transfers Overall transfer level: Needs assistance Equipment used: None, Rolling walker (2 wheels) Transfers: Sit to/from Stand, Bed to chair/wheelchair/BSC Sit to Stand: Contact guard assist   Step pivot transfers: Min assist       General transfer comment: education re: hand placement when transferring STS with RW    Ambulation/Gait Ambulation/Gait assistance: Min assist Gait Distance (Feet): 35 Feet (+ 40 ft) Assistive device:  Straight cane, Rolling walker (2 wheels) Gait Pattern/deviations: Decreased stride length, Decreased dorsiflexion - right, Decreased dorsiflexion - left, Decreased step length - left, Decreased step length - right Gait velocity: decreased     General Gait Details: Pt ambulates with SPC into hallway & back then with RW. Pt requires min assist overall 2/2 decreased balance, assistance maintaining neutral L wrist positioning when grasping RW with LUE. Pt self limits gait distance 2/2 decreased balance then incontinent BM.   Stairs             Wheelchair Mobility     Tilt Bed    Modified Rankin (Stroke Patients Only)       Balance Overall balance assessment: Needs assistance Sitting-balance support: Feet supported, Bilateral upper extremity supported Sitting balance-Leahy Scale: Fair Sitting balance - Comments: pt able to don socks sitting EOB with supervision   Standing balance support: During functional activity, No upper extremity supported Standing balance-Leahy Scale: Poor                              Cognition Arousal: Alert Behavior During Therapy: Flat affect Overall Cognitive Status: Within Functional Limits for tasks assessed                                 General Comments: decreased attention to L side of body        Exercises      General Comments General comments (skin integrity, edema, etc.): Pt with incontinent  BM in bed, attemps peri hygiene but PT provides assistance for thoroughness. Pt reports incontinence is new.      Pertinent Vitals/Pain Pain Assessment Pain Assessment: No/denies pain    Home Living   Living Arrangements: Spouse/significant other Available Help at Discharge: Family;Available 24 hours/day Type of Home: House Home Access: Stairs to enter Entrance Stairs-Rails: None Entrance Stairs-Number of Steps: 2   Home Layout: One level        Prior Function            PT Goals (current goals can  now be found in the care plan section) Acute Rehab PT Goals Patient Stated Goal: get better, return home Time For Goal Achievement: 09/26/23 Potential to Achieve Goals: Good Progress towards PT goals: Progressing toward goals    Frequency    Min 1X/week      PT Plan      Co-evaluation              AM-PAC PT "6 Clicks" Mobility   Outcome Measure  Help needed turning from your back to your side while in a flat bed without using bedrails?: None Help needed moving from lying on your back to sitting on the side of a flat bed without using bedrails?: A Little Help needed moving to and from a bed to a chair (including a wheelchair)?: A Little Help needed standing up from a chair using your arms (e.g., wheelchair or bedside chair)?: A Little Help needed to walk in hospital room?: A Little Help needed climbing 3-5 steps with a railing? : A Lot 6 Click Score: 18    End of Session Equipment Utilized During Treatment: Gait belt Activity Tolerance: Patient limited by fatigue Patient left: in chair;with chair alarm set;with call bell/phone within reach Nurse Communication: Mobility status PT Visit Diagnosis: Hemiplegia and hemiparesis;Unsteadiness on feet (R26.81);Muscle weakness (generalized) (M62.81);Other abnormalities of gait and mobility (R26.89) Hemiplegia - Right/Left: Left Hemiplegia - dominant/non-dominant: Non-dominant Hemiplegia - caused by: Cerebral infarction     Time: 4098-1191 PT Time Calculation (min) (ACUTE ONLY): 24 min  Charges:    $Gait Training: 8-22 mins $Neuromuscular Re-education: 8-22 mins PT General Charges $$ ACUTE PT VISIT: 1 Visit                     Aleda Grana, PT, DPT 09/15/23, 2:55 PM   Sandi Mariscal 09/15/2023, 2:54 PM

## 2023-09-15 NOTE — Consult Note (Signed)
Physical Medicine and Rehabilitation Consult Reason for Consult: Decreased functional mobility after stroke Referring Physician: Roda Shutters   HPI: Jacob Farmer is a 77 y.o. male with a history of MI, hypertension, end-stage renal disease on hemodialysis, atrial fibrillation not on anticoagulation due to history of bleeding jejunal ulcers and an AVM who presented on 09/11/2023 with acute onset left-sided weakness and numbness in addition to left facial droop.  Patient was given TNK and experienced improvement in symptoms.  CT of the head was without abnormality.  CTA demonstrated  50% stenosis of left ICA and severe stenosis at the origin of right vertebral artery and severe stenosis in the mid to distal left V4 segment.  MRI revealed small area of intraparenchymal hemorrhage within the right post central gyrus with mild surrounding edema as well as small foci of acute ischemia within the right parietal white matter at the base of the postcentral gyrus.  Patient was not placed on antithrombotic's due to hemorrhagic transformation.  Cardiology considering Watchman device.  Patient was up with therapy over the weekend and was min assist for sit to stand transfers and 60 feet of ambulation with 1 person hand-held assist.  Therapy notes significant ataxia on the left side.  Patient was min to max assist with ADLs.  Prior to this admission patient was independent and driving.  He lives at home with spouse in a 1 level home with 2 steps to enter.   Review of Systems  Constitutional: Negative.   HENT: Negative.    Eyes: Negative.   Respiratory: Negative.    Cardiovascular: Negative.   Gastrointestinal: Negative.   Genitourinary: Negative.   Musculoskeletal: Negative.   Skin: Negative.   Neurological:  Positive for dizziness, sensory change and focal weakness.  Psychiatric/Behavioral: Negative.     Past Medical History:  Diagnosis Date   CAD (coronary artery disease)    Acute myocardial infarction  treated with TPA in 1990; 1999-stents to circumflex and RCA; residual total occlusion of the left anterior descending; normal ejection fraction; stress nuclear in 2001-distal anteroseptal ischemia; normal LV function   CKD (chronic kidney disease) stage 4, GFR 15-29 ml/min (HCC)    First degree heart block    History of gastric ulcer    History of kidney stones    History of MI (myocardial infarction)    1990-  treated w/ TPA   Hyperlipidemia    Hypertension    Jaundice    OA (osteoarthritis)    Prediabetes    Prostate cancer (HCC)    Stage T1c , Gleason 3+4,  PSA 4.7,  vol 24cc--  scheduled for radiative seed implants   Psoriatic arthritis (HCC)    RA (rheumatoid arthritis) (HCC)    Dr. Beatrix Fetters   Sinus bradycardia    Wears dentures    Past Surgical History:  Procedure Laterality Date   BIOPSY  08/27/2019   Procedure: BIOPSY;  Surgeon: Malissa Hippo, MD;  [facility]: [facility];  Service: Endoscopy;;  gastric   BIOPSY  04/03/2022   Procedure: BIOPSY;  Surgeon: Dolores Frame, MD;  [facility]: [facility];  Service: Gastroenterology;;   BIOPSY  04/21/2023   Procedure: BIOPSY;  Surgeon: Lanelle Bal, DO;  [facility]: [facility];  Service: Endoscopy;;   callus removal Left 09/13/2022   left foot   CARDIOVASCULAR STRESS TEST  2001  per Dr Dietrich Pates clinic note   distal anteroseptal ischemia,  normal LVF   COLONOSCOPY  last one 2006 (  approx)   COLONOSCOPY WITH PROPOFOL N/A 04/03/2022   Procedure: COLONOSCOPY WITH PROPOFOL;  Surgeon: Dolores Frame, MD;  [facility]: [facility];  Service: Gastroenterology;  Laterality: N/A;  945   CORONARY ANGIOPLASTY WITH STENT PLACEMENT  1999   DES x2  to CFX and RCA/  residual total occlusion LAD,  normal LVEF   ENTEROSCOPY N/A 04/24/2023   Procedure: ENTEROSCOPY;  Surgeon: Dolores Frame, MD;  [facility];  Service: Gastroenterology;  Laterality: N/A;   ESOPHAGOGASTRODUODENOSCOPY (EGD) WITH  PROPOFOL N/A 08/27/2019   Procedure: ESOPHAGOGASTRODUODENOSCOPY (EGD) WITH PROPOFOL;  Surgeon: Malissa Hippo, MD;  [facility]: [facility];  Service: Endoscopy;  Laterality: N/A;  10:10   ESOPHAGOGASTRODUODENOSCOPY (EGD) WITH PROPOFOL N/A 04/03/2022   Procedure: ESOPHAGOGASTRODUODENOSCOPY (EGD) WITH PROPOFOL;  Surgeon: Dolores Frame, MD;  [facility];  Service: Gastroenterology;  Laterality: N/A;   ESOPHAGOGASTRODUODENOSCOPY (EGD) WITH PROPOFOL N/A 02/16/2023   Procedure: ESOPHAGOGASTRODUODENOSCOPY (EGD) WITH PROPOFOL;  Surgeon: Dolores Frame, MD;  [facility];  Service: Gastroenterology;  Laterality: N/A;   ESOPHAGOGASTRODUODENOSCOPY (EGD) WITH PROPOFOL N/A 04/21/2023   Procedure: ESOPHAGOGASTRODUODENOSCOPY (EGD) WITH PROPOFOL;  Surgeon: Lanelle Bal, DO;  [facility];  Service: Endoscopy;  Laterality: N/A;   EYE SURGERY Bilateral    cataract   GIVENS CAPSULE STUDY N/A 04/22/2023   Procedure: GIVENS CAPSULE STUDY;  Surgeon: Dolores Frame, MD;  [facility];  Service: Gastroenterology;  Laterality: N/A;   HEMOSTASIS CLIP PLACEMENT  04/24/2023   Procedure: HEMOSTASIS CLIP PLACEMENT;  Surgeon: Dolores Frame, MD;  [facility];  Service: Gastroenterology;;   HOT HEMOSTASIS  04/03/2022   Procedure: HOT HEMOSTASIS (ARGON PLASMA COAGULATION/BICAP);  Surgeon: Marguerita Merles, Reuel Boom, MD;  [facility]: [facility];  Service: Gastroenterology;;   HOT HEMOSTASIS  04/24/2023   Procedure: HOT HEMOSTASIS (ARGON PLASMA COAGULATION/BICAP);  Surgeon: Marguerita Merles, Reuel Boom, MD;  [facility]: [facility];  Service: Gastroenterology;;   IR FLUORO GUIDE CV LINE RIGHT  05/30/2023   IR US GUIDE VASC ACCESS RIGHT  05/30/2023   POLYPECTOMY  04/03/2022   Procedure: POLYPECTOMY;  Surgeon: Dolores Frame, MD;  [facility];  Service: Gastroenterology;;   POSTERIOR CERVICAL FUSION/FORAMINOTOMY  N/A 05/26/2017   Procedure: RIGHT C4-5 FORAMINOTOMY WITH EXCISION OF HERNIATED DISC;  Surgeon: Kerrin Champagne, MD;  Location: [facility];  Service: Orthopedics;  Laterality: N/A;   RADIOACTIVE SEED IMPLANT N/A 01/11/2016   Procedure: RADIOACTIVE SEED IMPLANT/BRACHYTHERAPY IMPLANT;  Surgeon: Marcine Matar, MD;  Location: [facility];  Service: Urology;  Laterality: N/A;   73  seeds implanted no seeds founds in bladder   SCLEROTHERAPY  04/24/2023   Procedure: SCLEROTHERAPY;  Surgeon: Marguerita Merles, Reuel Boom, MD;  [facility]: [facility];  Service: Gastroenterology;;   SUBMUCOSAL TATTOO INJECTION  04/24/2023   Procedure: SUBMUCOSAL TATTOO INJECTION;  Surgeon: Marguerita Merles, Reuel Boom, MD;  [facility];  Service: Gastroenterology;;   Family History  Problem Relation Age of Onset   Heart attack Mother    Coronary artery disease Father    Ulcerative colitis Father    Ulcers Father    Breast cancer Sister    COPD Brother    Pancreatic cancer Brother    Healthy Sister    Diabetes Brother    Cirrhosis Son    Seizures Son    Social History:  reports that he quit smoking about 34 years ago. His smoking use included cigarettes. He started  smoking about 64 years ago. He has a 30 pack-year smoking history. He has never been exposed to tobacco smoke. He has never used smokeless tobacco. He reports current alcohol use. He reports that he does not use drugs. Allergies: No Known Allergies Facility-Administered Medications Prior to Admission  Medication Dose Route Frequency Provider Last Rate Last Admin   degarelix (FIRMAGON) injection 240 mg  240 mg Subcutaneous Once Marcine Matar, MD       Medications Prior to Admission  Medication Sig Dispense Refill   allopurinol (ZYLOPRIM) 100 MG tablet TAKE 1/2 TABLET EVERY OTHER DAY 23 tablet 0   amLODipine (NORVASC) 10 MG tablet Take 1 tablet (10 mg total) by mouth daily. 90 tablet 3   calcitRIOL (ROCALTROL) 0.25 MCG capsule Take  0.25 mcg by mouth every Monday, Wednesday, and Friday.     Dupilumab (DUPIXENT) 300 MG/2ML SOPN Inject 300 mg into the skin every 14 (fourteen) days. Starting at day 15 for maintenance. 12 mL 1   folic acid (FOLVITE) 400 MCG tablet Take 400 mcg by mouth daily.     hydrALAZINE (APRESOLINE) 25 MG tablet Take 1 tablet (25 mg total) by mouth 2 (two) times daily. 180 tablet 3   leflunomide (ARAVA) 10 MG tablet TAKE 1 TABLET EVERY DAY 90 tablet 0   Leuprolide Acetate (ELIGARD Maxwell) Inject 1 Dose into the skin every 4 (four) months.     nitroGLYCERIN (NITROSTAT) 0.4 MG SL tablet Place 0.4 mg under the tongue every 5 (five) minutes as needed for chest pain.      pantoprazole (PROTONIX) 40 MG tablet TAKE 1 TABLET TWICE DAILY 180 tablet 1   RENVELA 800 MG tablet Take 800 mg by mouth 2 (two) times daily with a meal.     rosuvastatin (CRESTOR) 40 MG tablet Take 1 tablet (40 mg total) by mouth daily. 90 tablet 3   triamcinolone cream (KENALOG) 0.1 % Apply 1 Application topically daily. (Patient taking differently: Apply 1 Application topically daily as needed (irritation).)      Home: Home Living Family/patient expects to be discharged to:: Private residence Living Arrangements: Spouse/significant other Available Help at Discharge: Family, Available 24 hours/day Type of Home: House Home Access: Stairs to enter [employer] of Steps: 2 Entrance Stairs-Rails: None Home Layout: One level Bathroom Shower/Tub: Health visitor: Standard Home Equipment: Occupational hygienist (4 wheels), Shower seat - built in, [place] - single point, Agricultural consultant (2 wheels)  Lives With: Spouse [place] Citizen)  Functional History: Prior Function Prior Level of Function : Independent/Modified Independent, Driving Mobility Comments: Community ambulator without AD, drives ADLs Comments: Independent Functional Status:  Mobility: Bed Mobility Overal bed mobility: Needs Assistance Bed Mobility: Supine to Sit, Sit to  Supine Supine to sit: Used rails, Contact guard Sit to supine: Contact guard assist General bed mobility comments: cues for technique for supine to sit using R hand on rail and some assist to lift trunk; to supine assist for positioning Transfers Overall transfer level: Needs assistance Equipment used: Rolling walker (2 wheels) Transfers: Sit to/from Stand Sit to Stand: Min assist General transfer comment: cues for technique, assist for balance with support at R wrist for placing UE on RW. Used RW for engagement in weightbearing on extended UE with support at wrist for joint stability Ambulation/Gait Ambulation/Gait assistance: Min assist Gait Distance (Feet): 60 Feet Assistive device: 1 person hand held assist Gait Pattern/deviations: Step-to pattern, Step-through pattern, Decreased stride length, Ataxic General Gait Details: mild ataxia with L LE incoordination; R HHA and  increased time    ADL: ADL Overall ADL's : Needs assistance/impaired Grooming: Minimal assistance, Sitting Grooming Details (indicate cue type and reason): for bil tasks Upper Body Bathing: Moderate assistance Lower Body Bathing: Sit to/from stand, Moderate assistance Upper Body Dressing : Moderate assistance, Sitting Lower Body Dressing: Maximal assistance, Sit to/from stand Toilet Transfer: Minimal assistance, Stand-pivot, Rolling walker (2 wheels) Toileting- Clothing Manipulation and Hygiene: Total assistance, Bed level Toileting - Clothing Manipulation Details (indicate cue type and reason): Pt with BM at EOB and seemingly poor awareness. pt with max request not to stand again so performing at bed level Functional mobility during ADLs: Minimal assistance, Rolling walker (2 wheels) (assist for balance and RW management)  Cognition: Cognition Overall Cognitive Status: Within Functional Limits for tasks assessed Arousal/Alertness: Awake/alert (slightly lethargic) Orientation Level: Oriented X4 Year: 2024 Day  of Week: Correct Attention: Sustained Focused Attention: Appears intact Sustained Attention: Appears intact Memory: Appears intact Awareness: Appears intact Problem Solving: Appears intact Executive Function: Organizing Sequencing: Appears intact Organizing: Appears intact Safety/Judgment: Appears intact Cognition Arousal: Alert Behavior During Therapy: WFL for tasks assessed/performed Overall Cognitive Status: Within Functional Limits for tasks assessed General Comments: questionable Decr attention to L side of body  Blood pressure (!) 144/70, pulse 73, temperature 97.9 F (36.6 C), temperature source Oral, resp. rate 16, height 5\' 10"  (1.778 m), weight 70.3 kg, SpO2 100%. Physical Exam Constitutional:      Appearance: Normal appearance.  HENT:     Head: Normocephalic.     Right Ear: External ear normal.     Left Ear: External ear normal.     Nose: Nose normal.     Mouth/Throat:     Mouth: Mucous membranes are moist.  Eyes:     Extraocular Movements: Extraocular movements intact.     Pupils: Pupils are equal, round, and reactive to light.  Cardiovascular:     Rate and Rhythm: Normal rate.  Pulmonary:     Effort: Pulmonary effort is normal.  Abdominal:     Palpations: Abdomen is soft.  Musculoskeletal:        General: No swelling or deformity. Normal range of motion.     Cervical back: Normal range of motion.  Skin:    General: Skin is warm.  Neurological:     Mental Status: He is alert.     Comments: Alert and oriented x 3. Normal insight and awareness. Intact Memory. Normal language and speech sl dysarthric. Cranial nerve exam unremarkable except for mild left central 7. MMT: RUE 5/5. LUE 3+/5 prox to 4-/5 distally. RLE 5/5. LLE 4+/5. Sl sensory loss LUE. No abnormal resting tone. DTR's 1+.    Psychiatric:        Mood and Affect: Mood normal.        Behavior: Behavior normal.     Results for orders placed or performed during the hospital encounter of 09/11/23  (from the past 24 hour(s))  CBC     Status: Abnormal   Collection Time: 09/15/23  7:38 AM  Result Value Ref Range   WBC 13.6 (H) 4.0 - 10.5 K/uL   RBC 3.28 (L) 4.22 - 5.81 MIL/uL   Hemoglobin 9.7 (L) 13.0 - 17.0 g/dL   HCT 16.1 (L) 09.6 - 04.5 %   MCV 90.2 80.0 - 100.0 fL   MCH 29.6 26.0 - 34.0 pg   MCHC 32.8 30.0 - 36.0 g/dL   RDW 40.9 81.1 - 91.4 %   Platelets 175 150 - 400 K/uL  nRBC 0.0 0.0 - 0.2 %  Basic metabolic panel     Status: Abnormal   Collection Time: 09/15/23  7:38 AM  Result Value Ref Range   Sodium 134 (L) 135 - 145 mmol/L   Potassium 3.9 3.5 - 5.1 mmol/L   Chloride 94 (L) 98 - 111 mmol/L   CO2 23 22 - 32 mmol/L   Glucose, Bld 107 (H) 70 - 99 mg/dL   BUN 66 (H) 8 - 23 mg/dL   Creatinine, Ser 1.61 (H) 0.61 - 1.24 mg/dL   Calcium 8.6 (L) 8.9 - 10.3 mg/dL   GFR, Estimated 6 (L) >60 mL/min   Anion gap 17 (H) 5 - 15   CT HEAD WO CONTRAST ( )  Result Date: 09/13/2023 CLINICAL DATA:  Follow-up stroke. Acute neurological deficit. Intraparenchymal hemorrhage right postcentral gyrus. Stroke in the right parietal white matter. EXAM: CT HEAD WITHOUT CONTRAST TECHNIQUE: Contiguous axial images were obtained from the base of the skull through the vertex without intravenous contrast. RADIATION DOSE REDUCTION: This exam was performed according to the departmental dose-optimization program which includes automated exposure control, adjustment of the mA and/or kV according to patient size and/or use of iterative reconstruction technique. COMPARISON:  MRI 09/12/2023 and CT 09/11/2023 FINDINGS: Brain: Probable artifactual low-density in the pons. No focal cerebellar finding. Cerebral hemispheres show age related atrophy and chronic small-vessel ischemic change. Hemorrhagic infarction in the right posterior frontal vertex appears similar to the MRI study, with the hemorrhage measuring proximally 2.4 x 1.7 cm in size. Very small amount of adjacent subarachnoid hemorrhage within a sulcus.  Other small near by right frontoparietal cortical infarctions are not visible by CT. No mass effect. No subdural hemorrhage. No hydrocephalus. Vascular: There is atherosclerotic calcification of the major vessels at the base of the brain. Skull: Negative Sinuses/Orbits: Clear/normal Other: None IMPRESSION: 1. Compared to the MRI of yesterday, no change in the appearance of a 2.4 x 1.7 cm hemorrhagic infarction in the right posterior frontal vertex is suspected. Very small amount of adjacent subarachnoid hemorrhage. No mass effect. 2. Other small near by right frontoparietal cortical infarctions shown by MRI are not visible by CT. 3. Age related atrophy and chronic small-vessel ischemic change of the cerebral hemispheric white matter. Electronically Signed   By: Paulina Fusi M.D.   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 Does the need for close, 24 hr/day medical supervision in concert with the patient's rehab needs make it unreasonable for this patient to be served in a less intensive setting? Yes Co-Morbidities requiring supervision/potential complications:  -Atrial fibrillation -Recent GI bleed -Hypertension -End-stage renal disease on hemodialysis Due to bladder management, bowel management, safety, skin/wound care, disease management, medication administration, pain management, and patient education, does the patient require 24 hr/day rehab nursing? Yes Does the patient require coordinated care of a physician, rehab nurse, therapy disciplines of PT, OT to address physical and functional deficits in the context of the above medical diagnosis(es)? Yes Addressing deficits in the following areas: balance, endurance, locomotion, strength, transferring, bowel/bladder control, bathing, dressing, feeding, grooming, toileting, and psychosocial  support Can the patient actively participate in an intensive therapy program of at least 3 hrs of therapy per day at least 5 days per week? Yes The potential for patient to make measurable gains while on inpatient rehab is excellent Anticipated functional outcomes upon discharge from inpatient rehab  are modified independent  with PT, modified independent with OT, n/a with SLP. Estimated rehab length of stay to reach the above functional goals is: 7-9 days Anticipated discharge destination: Home Overall Rehab/Functional Prognosis: excellent  POST ACUTE RECOMMENDATIONS: This patient's condition is appropriate for continued rehabilitative care in the following setting: CIR Patient has agreed to participate in recommended program. Yes Note that insurance prior authorization may be required for reimbursement for recommended care.  Comment: Pt was active and independent prior to admit. Wife at home to help. Rehab Admissions Coordinator to follow up.      I have personally performed a face to face diagnostic evaluation of this patient. Additionally, I have examined the patient's medical record including any pertinent labs and radiographic images. If the physician assistant has documented in this note, I have reviewed and edited or otherwise concur with the physician assistant's documentation.  Thanks,  Ranelle Oyster, MD 09/15/2023

## 2023-09-15 NOTE — Plan of Care (Signed)
  Problem: Safety: Goal: Ability to remain free from injury will improve Outcome: Progressing   Problem: Skin Integrity: Goal: Risk for impaired skin integrity will decrease Outcome: Progressing   

## 2023-09-15 NOTE — Care Management Important Message (Signed)
Important Message  Patient Details  Name: Jacob Farmer MRN: [id number] Date of Birth: 1946-05-22   Important Message Given:  Yes - Medicare IM     Dorena Bodo 09/15/2023, 3:22 PM

## 2023-09-15 NOTE — Progress Notes (Addendum)
STROKE TEAM PROGRESS NOTE  INTERIM HISTORY/SUBJECTIVE Patient family in room/ patient in bed napping. NAD   BRIEF HPI Jacob Farmer is a 77 y.o. male with history of MI, hypertension, hyperlipidemia, former smoker, end-stage renal disease on hemodialysis, atrial fibrillation not on anticoagulation due to bleeding ulcers presenting with sudden onset of left arm weakness and numbness as well as left facial droop.  Patient was given TNK to treat his stroke and has experienced improvement in symptoms since then.  He is awaiting MRI and will need dialysis resumed while he is here.  He is on a Monday Wednesday Friday schedule.   SIGNIFICANT HOSPITAL EVENTS 9/27 patient admitted and TNK given 9/28 - Hemorrhagic transformation, stay in ICU over night 9/29 transferred out of ICU    CBC    Component Value Date/Time   WBC 6.1 09/11/2023 2133   RBC 3.73 (L) 09/11/2023 2133   HGB 11.2 (L) 09/11/2023 2136   HGB 12.1 (L) 08/21/2023 1321   HCT 33.0 (L) 09/11/2023 2136   HCT 38.8 08/21/2023 1321   PLT 162 09/11/2023 2133   PLT 198 08/21/2023 1321   MCV 89.5 09/11/2023 2133   MCV 91 08/21/2023 1321   MCH 29.2 09/11/2023 2133   MCHC 32.6 09/11/2023 2133   RDW 14.5 09/11/2023 2133   RDW 13.1 08/21/2023 1321   LYMPHSABS 1.1 09/11/2023 2133   LYMPHSABS 1.4 08/21/2023 1321   MONOABS 0.6 09/11/2023 2133   EOSABS 0.3 09/11/2023 2133   EOSABS 0.3 08/21/2023 1321   BASOSABS 0.0 09/11/2023 2133   BASOSABS 0.0 08/21/2023 1321    BMET    Component Value Date/Time   NA 134 (L) 09/15/2023 0738   NA 138 08/21/2023 1321   K 3.9 09/15/2023 0738   CL 94 (L) 09/15/2023 0738   CO2 23 09/15/2023 0738   GLUCOSE 107 (H) 09/15/2023 0738   BUN 66 (H) 09/15/2023 0738   BUN 20 08/21/2023 1321   CREATININE 8.33 (H) 09/15/2023 0738   CREATININE 4.66 (H) 03/28/2023 1340   CALCIUM 8.6 (L) 09/15/2023 0738   EGFR 17 (L) 08/21/2023 1321   GFRNONAA 6 (L) 09/15/2023 0738   GFRNONAA 23 (L) 06/14/2021 1322     IMAGING past 24 hours No results found.  Vitals:   09/14/23 2031 09/14/23 2335 09/15/23 0335 09/15/23 0752  BP: 137/65 (!) 144/72 134/65 (!) 144/70  Pulse: 75 77 76 73  Resp: 18 16 17 16   Temp: 98.3 F (36.8 C) 98.6 F (37 C) 97.9 F (36.6 C) 97.9 F (36.6 C)  TempSrc: Oral Oral Oral Oral  SpO2: 99% 100% 100% 100%  Weight:      Height:         PHYSICAL EXAM General:  Alert, well-nourished, well-developed patient in no acute distress Psych:  Mood and affect appropriate for situation CV: Regular rate and rhythm on monitor Respiratory:  Regular, unlabored respirations on room air GI: Abdomen soft and nontender   NEURO:  Mental Status: AA&Ox3, patient is able to give clear and coherent history Speech/Language: speech is without dysarthria or aphasia.    Cranial Nerves:  II: PERRL. Visual fields full.  III, IV, VI: EOMI. Eyelids elevate symmetrically.  V: Sensation is intact to light touch and symmetrical to face.  VII: Slight left facial droop VIII: hearing intact to voice. IX, X: Phonation is normal.  XII: tongue is midline without fasciculations. Motor: 5/5 strength to all muscle groups tested.  Slight grip weakness on the left  Tone: is  normal and bulk is normal Sensation- Intact to light touch bilaterally.   Coordination: FTN intact bilaterally.  No drift.  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 ASPECTS 10.    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 No mass effect or midline shift. Small foci of acute ischemia within the right parietal white matter at the base of the postcentral  gyrus. Repeat CT Head -  Compared to the MRI of yesterday, no change in the appearance of a 2.4 x 1.7 cm hemorrhagic infarction in the right posterior frontal vertex is suspected. Very small amount of adjacent subarachnoid hemorrhage 2D Echo EF 60-65% with Grade 1 diastolic dysfunction LDL 41 HgbA1c 5.3 VTE prophylaxis - heparin subq No antithrombotic prior to admission, now on aspirin 81 mg daily after discussion with GI.  Therapy recommendations: CIR Disposition: Pending  Atrial fibrillation Home Meds: No anticoagulation due to history of bleeding ulcers and severe GI bleed Continue telemetry monitoring Per cardiology note, they are considering of Watchman device if pt is able to tolerate short term [facility] Discussed with structural cardiology team, will setup appointment regarding watchman device option next month Pt will continue follow up with Dr. 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  Pt was not on antiplatelet or anticoagulation since then D/w GI Dr. Levon Hedger, okay to start low dose ASA.  Hypertension Home meds: Amlodipine 10 mg daily, hydralazine 25 mg twice daily Stable Now on norvasc 10 and hydralazine 50 bid Blood pressure goal < 160 Long term BP goal normotensive  Hyperlipidemia Home meds: Rosuvastatin 40 mg daily, resumed in hospital LDL 41, goal < 70 Continue statin at discharge  End-stage renal disease on dialysis Patient has been on dialysis for about 3 months, has Vas-Cath On Monday Wednesday Friday hemodialysis schedule Nephrology consulted, appreciate recommendations  Other Stroke Risk Factors Advanced age  Other Active  Problems Rheumatoid arthritis Diarrhea - C diff test negative - on loperamide PRN -> scheduled Q8h Nutrition - decreased appetite - add ensure supplement  Hospital day # 4  Patient seen and examined by NP/APP with MD. MD to update note as needed.   Arline Asp , MSN, FNP-C [facility] See Amion for schedule and pager information 09/15/2023 9:08 AM  ATTENDING NOTE: I reviewed above note and agree with the assessment and plan. Pt was seen and examined.   RN and wife are at the bedside. Pt no acute event, neuro stable. Still has diarrhea, on imodium PRN but he did not ask for it, will change to scheduled Q8h dose. Talked with cardiology structural team, will see him soon for watchman device. Talked with GI Dr. Levon Hedger, cleared for low dose ASA. Continue statin, pending CIR.  For detailed assessment and plan, please refer to above/below as I have made changes wherever appropriate.   Marvel Plan, MD PhD  Stroke Neurology 09/15/2023 3:18 PM     To contact Stroke Continuity provider, please refer to [email or web address]. After hours, contact General Neurology

## 2023-09-15 NOTE — Progress Notes (Addendum)
Inpatient Rehab Coordinator Note:  I met with patient and wife, [name] at bedside to discuss CIR recommendations and goals/expectations of CIR stay.  We reviewed 3 hrs/day of therapy, physician follow up, and average length of stay 2 weeks (dependent upon progress) with goals of mod I.  Patient and wife are interested in pursuing CIR. Wife is home with patient 24/7 and can provide assistance as needed. Consult pending and will open insurance as soon as completed.   Addendum- Insurance opened today for potential CIR admission. Continue to follow.   Rehab Admissons Coordinator [place], , [place] [phone]

## 2023-09-15 NOTE — PMR Pre-admission (Signed)
PMR Admission Coordinator Pre-Admission Assessment  Patient: Jacob Farmer is an 77 y.o., male MRN: [id number] DOB: 06/30/1946 Height: 5\' 10"  (177.8 cm) Weight: 70.3 kg              Insurance Information HMO: yes    PPO:      PCP:      IPA:      80/20:      OTHER:  PRIMARY: [employer] medicare      Policy#: [id number]      Subscriber: patient CM Name: Danella Penton      Phone#:  [phone] ext [phone]    Fax#: [phone] Pre-Cert#: [id number]   Received insurance approval on 09/16/23. Pt approved for 7 days beginning 09/16/23.   Employer:  Benefits:  Phone #: [phone]     Name:  Eff. Date: 12/16/2022     Deduct: $0      Out of Pocket Max: $3,600(met)        CIR: $295/day with a max copay of $2065 ( 7 days)      SNF: $20/day co pay for days 1-20 Outpatient: $25 copay/ visit       Home Health: 100% coverage      DME: 80% coverage        The "Data Collection Information Summary" for patients in Inpatient Rehabilitation Facilities with attached "Privacy Act Statement-Health Care Records" was provided and verbally reviewed with: Patient and Family  Emergency Contact Information Contact Information     Name Relation Home Work Mobile   Calame,[name] Spouse [phone]  [phone]      Other Contacts   None on File    Current Medical History  Patient Admitting Diagnosis: CVA History of Present Illness: Jacob Farmer is a 77 y.o. male with a history of MI, hypertension, end-stage renal disease on hemodialysis, atrial fibrillation not on anticoagulation due to history of bleeding jejunal ulcers and an AVM who presented on 09/11/2023 with acute onset left-sided weakness and numbness in addition to left facial droop.  Patient was given TNK and experienced improvement in symptoms.  CT of the head was without abnormality.  CTA demonstrated  50% stenosis of left ICA and severe stenosis at the origin of right vertebral artery and severe stenosis in the mid to distal left V4 segment.  MRI  revealed small area of intraparenchymal hemorrhage within the right post central gyrus with mild surrounding edema as well as small foci of acute ischemia within the right parietal white matter at the base of the postcentral gyrus.  Patient was not placed on antithrombotic's due to hemorrhagic transformation.  Cardiology considering Watchman device.  Complete NIHSS TOTAL: 2 Glasgow Coma Scale Score: 15  Patient's medical record from [facility] has been reviewed by the rehabilitation admission coordinator and physician.  Past Medical History  Past Medical History:  Diagnosis Date   CAD (coronary artery disease)    Acute myocardial infarction treated with TPA in 1990; 1999-stents to circumflex and RCA; residual total occlusion of the left anterior descending; normal ejection fraction; stress nuclear in 2001-distal anteroseptal ischemia; normal LV function   CKD (chronic kidney disease) stage 4, GFR 15-29 ml/min (HCC)    First degree heart block    History of gastric ulcer    History of kidney stones    History of MI (myocardial infarction)    1990-  treated w/ TPA   Hyperlipidemia    Hypertension    Jaundice    OA (osteoarthritis)    Prediabetes  Prostate cancer (HCC)    Stage T1c , Gleason 3+4,  PSA 4.7,  vol 24cc--  scheduled for radiative seed implants   Psoriatic arthritis (HCC)    RA (rheumatoid arthritis) (HCC)    Dr. Beatrix Fetters   Sinus bradycardia    Wears dentures     Has the patient had major surgery during 100 days prior to admission? No  Family History  family history includes Breast cancer in his sister; COPD in his brother; Cirrhosis in his son; Coronary artery disease in his father; Diabetes in his brother; Healthy in his sister; Heart attack in his mother; Pancreatic cancer in his brother; Seizures in his son; Ulcerative colitis in his father; Ulcers in his father.   Current Medications   Current Facility-Administered Medications:    acetaminophen (TYLENOL)  tablet 650 mg, 650 mg, Oral, Q4H PRN, 650 mg at 09/14/23 1612 **OR** acetaminophen (TYLENOL) 160 MG/5ML solution 650 mg, 650 mg, Per Tube, Q4H PRN **OR** acetaminophen (TYLENOL) suppository 650 mg, 650 mg, Rectal, Q4H PRN, Erick Blinks, MD   allopurinol (ZYLOPRIM) tablet 100 mg, 100 mg, Oral, Daily, Pearlean Brownie, Pramod S, MD, 100 mg at 09/15/23 0947   amLODipine (NORVASC) tablet 10 mg, 10 mg, Oral, Daily, Pearlean Brownie, Pramod S, MD, 10 mg at 09/14/23 1033   aspirin EC tablet 81 mg, 81 mg, Oral, Daily, Valentina Lucks N, NP, 81 mg at 09/15/23 1308   calcitRIOL (ROCALTROL) capsule 0.25 mcg, 0.25 mcg, Oral, Q M,W,F, Pearlean Brownie, Pramod S, MD, 0.25 mcg at 09/15/23 0947   Chlorhexidine Gluconate Cloth 2 % PADS 6 each, 6 each, Topical, Q0600, Lynnda Child, MD, 6 each at 09/12/23 0059   Chlorhexidine Gluconate Cloth 2 % PADS 6 each, 6 each, Topical, Q0600, Maxie Barb, MD, 6 each at 09/15/23 0557   [COMPLETED] labetalol (NORMODYNE) injection 20 mg, 20 mg, Intravenous, Once, 20 mg at 09/12/23 0149 **AND** clevidipine (CLEVIPREX) infusion 0.5 mg/mL, 0-21 mg/hr, Intravenous, Continuous, Erick Blinks, MD, Stopped at 09/12/23 1416   feeding supplement (ENSURE ENLIVE / ENSURE PLUS) liquid 237 mL, 237 mL, Oral, BID BM, Marvel Plan, MD, 237 mL at 09/15/23 1419   folic acid (FOLVITE) tablet 0.5 mg, 0.5 mg, Oral, Daily, Pearlean Brownie, Pramod S, MD, 0.5 mg at 09/15/23 0947   heparin injection 5,000 Units, 5,000 Units, Subcutaneous, Q8H, Marvel Plan, MD, 5,000 Units at 09/15/23 1419   hydrALAZINE (APRESOLINE) injection 20 mg, 20 mg, Intravenous, Q4H PRN, Pamalee Leyden, Devon, NP, 20 mg at 09/13/23 1509   hydrALAZINE (APRESOLINE) tablet 50 mg, 50 mg, Oral, BID, Shafer, Devon, NP, 50 mg at 09/14/23 2033   labetalol (NORMODYNE) injection 20 mg, 20 mg, Intravenous, Q2H PRN, Pamalee Leyden, Devon, NP, 20 mg at 09/13/23 1301   leflunomide (ARAVA) tablet 10 mg, 10 mg, Oral, Daily, Pearlean Brownie, Pramod S, MD, 10 mg at 09/15/23 0950   lidocaine  (LIDODERM) 5 % 1 patch, 1 patch, Transdermal, Q24H, Shafer, Devon, NP, 1 patch at 09/15/23 0817   loperamide (IMODIUM) capsule 2 mg, 2 mg, Oral, Q8H, Marvel Plan, MD   Muscle Rub CREA, , Topical, PRN, Erick Blinks, MD, Given at 09/12/23 0433   ondansetron (ZOFRAN) injection 4 mg, 4 mg, Intravenous, Q6H PRN, Pamalee Leyden, Devon, NP, 4 mg at 09/13/23 1635   pantoprazole (PROTONIX) EC tablet 40 mg, 40 mg, Oral, BID, Micki Riley, MD, 40 mg at 09/15/23 0947   rosuvastatin (CRESTOR) tablet 40 mg, 40 mg, Oral, Daily, Erick Blinks, MD, 40 mg at 09/15/23 0947   sevelamer carbonate (RENVELA) tablet 800 mg,  800 mg, Oral, BID WC, Micki Riley, MD, 800 mg at 09/15/23 1610  Patients Current Diet:  Diet Order             Diet Heart Room service appropriate? Yes; Fluid consistency: Thin  Diet effective now                   Precautions / Restrictions Precautions Precautions: Fall Precaution Comments: L hemiparesis Restrictions Weight Bearing Restrictions: No   Has the patient had 2 or more falls or a fall with injury in the past year?No  Prior Activity Level Community (5-7x/wk): without AD, drives  Prior Functional Level Prior Function Prior Level of Function : Independent/Modified Independent, Driving Mobility Comments: Community ambulator without AD, drives ADLs Comments: Independent  Self Care: Did the patient need help bathing, dressing, using the toilet or eating?  Independent  Indoor Mobility: Did the patient need assistance with walking from room to room (with or without device)? Independent  Stairs: Did the patient need assistance with internal or external stairs (with or without device)? Independent  Functional Cognition: Did the patient need help planning regular tasks such as shopping or remembering to take medications? Independent  Patient Information Are you of Hispanic, Latino/a,or Spanish origin?: A. No, not of Hispanic, Latino/a, or Spanish origin What  is your race?: A. White Do you need or want an interpreter to communicate with a doctor or health care staff?: 0. No  Patient's Response To:  Health Literacy and Transportation Is the patient able to respond to health literacy and transportation needs?: Yes Health Literacy - How often do you need to have someone help you when you read instructions, pamphlets, or other written material from your doctor or pharmacy?: Never In the past 12 months, has lack of transportation kept you from medical appointments or from getting medications?: No In the past 12 months, has lack of transportation kept you from meetings, work, or from getting things needed for daily living?: No  Journalist, newspaper / Equipment Home Equipment: Occupational hygienist (4 wheels), Shower seat - built in, Hubbell - single point, Agricultural consultant (2 wheels)  Prior Device Use: Indicate devices/aids used by the patient prior to current illness, exacerbation or injury? None of the above  Current Functional Level Cognition  Arousal/Alertness: Awake/alert (slightly lethargic) Overall Cognitive Status: Within Functional Limits for tasks assessed Orientation Level: Oriented X4 General Comments: decreased attention to L side of body Attention: Sustained Focused Attention: Appears intact Sustained Attention: Appears intact Memory: Appears intact Awareness: Appears intact Problem Solving: Appears intact Executive Function: Organizing Sequencing: Appears intact Organizing: Appears intact Safety/Judgment: Appears intact    Extremity Assessment (includes Sensation/Coordination)  Upper Extremity Assessment: Right hand dominant, LUE deficits/detail LUE Deficits / Details: No active motion at wrist or digits. elbow flex/ext grossly [facility] but poor control and 3/5 strength. Shoulder 4/5 LUE Sensation: decreased light touch, decreased proprioception (tingling) LUE Coordination: decreased fine motor, decreased gross motor  Lower Extremity Assessment:  Defer to PT evaluation LLE Deficits / Details: hip flexion 3+/5, knee extension 4/5, ankle DF 4+/5    ADLs  Overall ADL's : Needs assistance/impaired Grooming: Minimal assistance, Sitting Grooming Details (indicate cue type and reason): for bil tasks Upper Body Bathing: Moderate assistance Lower Body Bathing: Sit to/from stand, Moderate assistance Upper Body Dressing : Moderate assistance, Sitting Lower Body Dressing: Maximal assistance, Sit to/from stand Toilet Transfer: Minimal assistance, Stand-pivot, Rolling walker (2 wheels) Toileting- Clothing Manipulation and Hygiene: Total assistance, Bed level Toileting - Clothing Manipulation  Details (indicate cue type and reason): Pt with BM at EOB and seemingly poor awareness. pt with max request not to stand again so performing at bed level Functional mobility during ADLs: Minimal assistance, Rolling walker (2 wheels) (assist for balance and RW management)    Mobility  Overal bed mobility: Needs Assistance Bed Mobility: Supine to Sit Supine to sit: Supervision, HOB elevated, Used rails Sit to supine: Contact guard assist General bed mobility comments: cues for technique for supine to sit using R hand on rail and some assist to lift trunk; to supine assist for positioning    Transfers  Overall transfer level: Needs assistance Equipment used: None, Rolling walker (2 wheels) Transfers: Sit to/from Stand, Bed to chair/wheelchair/BSC Sit to Stand: Contact guard assist Bed to/from chair/wheelchair/BSC transfer type:: Step pivot Step pivot transfers: Min assist General transfer comment: education re: hand placement when transferring STS with RW    Ambulation / Gait / Stairs / Wheelchair Mobility  Ambulation/Gait Ambulation/Gait assistance: Editor, commissioning (Feet): 35 Feet (+ 40 ft) Assistive device: Straight cane, Rolling walker (2 wheels) Gait Pattern/deviations: Decreased stride length, Decreased dorsiflexion - right, Decreased  dorsiflexion - left, Decreased step length - left, Decreased step length - right General Gait Details: Pt ambulates with SPC into hallway & back then with RW. Pt requires min assist overall 2/2 decreased balance, assistance maintaining neutral L wrist positioning when grasping RW with LUE. Pt self limits gait distance 2/2 decreased balance then incontinent BM. Gait velocity: decreased    Posture / Balance Dynamic Sitting Balance Sitting balance - Comments: pt able to don socks sitting EOB with supervision Balance Overall balance assessment: Needs assistance Sitting-balance support: Feet supported, Bilateral upper extremity supported Sitting balance-Leahy Scale: Fair Sitting balance - Comments: pt able to don socks sitting EOB with supervision Standing balance support: During functional activity, No upper extremity supported Standing balance-Leahy Scale: Poor Standing balance comment: Needs support in standing    Special needs/care consideration Dialysis: Hemodialysis Monday, Wednesday, and Friday and Skin intact     Previous Home Environment (from acute therapy documentation) Living Arrangements: Spouse/significant other  Lives With: Spouse Available Help at Discharge: Family, Available 24 hours/day Type of Home: House Home Layout: One level Home Access: Stairs to enter Entrance Stairs-Rails: None Entrance Stairs-Number of Steps: 2 Bathroom Shower/Tub: Health visitor: Standard Bathroom Accessibility: Yes How Accessible: Accessible via walker  Discharge Living Setting Plans for Discharge Living Setting: Patient's home, Lives with (comment) (Spouse) Type of Home at Discharge: House Discharge Home Layout: One level Discharge Home Access: Stairs to enter Entrance Stairs-Rails: None Entrance Stairs-Number of Steps: 2 Discharge Bathroom Shower/Tub: Walk-in shower Discharge Bathroom Toilet: Standard Discharge Bathroom Accessibility: Yes How Accessible: Accessible  via walker Does the patient have any problems obtaining your medications?: No  Social/Family/Support Systems Anticipated Caregiver: spouse, [name] Anticipated Industrial/product designer Information: [phone] Caregiver Availability: 24/7 Discharge Plan Discussed with Primary Caregiver: Yes Is Caregiver In Agreement with Plan?: Yes Does Caregiver/Family have Issues with Lodging/Transportation while Pt is in Rehab?: No   Goals Patient/Family Goal for Rehab: Mod I PT, OT, independent with SLP Expected length of stay: 12-14 days Pt/Family Agrees to Admission and willing to participate: Yes Program Orientation Provided & Reviewed with Pt/Caregiver Including Roles  & Responsibilities: Yes  Barriers to Discharge: Insurance for SNF coverage   Decrease burden of Care through IP rehab admission: NA   Possible need for SNF placement upon discharge: Not Anticipated   Patient Condition: This patient's medical and  functional status has not changed since the consult dated 09/15/23 in which the Rehabilitation Physician determined and documented that the patient was potentially appropriate for intensive rehabilitative care in an inpatient rehabilitation facility. Issues have been addressed and update has been discussed with Dr. Berline Chough and patient now appropriate for inpatient rehabilitation. Will admit to inpatient rehab today.   Preadmission Screen Completed By:  Beryle Beams, 09/15/2023 3:23 PM ______________________________________________________________________   Discussed status with Dr. Berline Chough on 09/16/23 at 12:23 PM and received approval for admission today.  Admission Coordinator:  Beryle Beams, time 12:23 PM/Date 09/16/23

## 2023-09-16 ENCOUNTER — Inpatient Hospital Stay (HOSPITAL_COMMUNITY)
Admission: AD | Admit: 2023-09-16 | Discharge: 2023-09-25 | DRG: 056 | Disposition: A | Discharge status: Home Health | Payer: Medicare HMO | Source: Intra-hospital | Attending: Physical Medicine & Rehabilitation | Admitting: Physical Medicine & Rehabilitation

## 2023-09-16 ENCOUNTER — Encounter (HOSPITAL_COMMUNITY): Admission: RE | Payer: Self-pay | Source: Home / Self Care

## 2023-09-16 ENCOUNTER — Encounter (HOSPITAL_COMMUNITY): Payer: Self-pay | Admitting: Neurology

## 2023-09-16 ENCOUNTER — Encounter (HOSPITAL_COMMUNITY): Payer: Self-pay | Admitting: Physical Medicine & Rehabilitation

## 2023-09-16 ENCOUNTER — Ambulatory Visit (HOSPITAL_COMMUNITY): Admission: RE | Admit: 2023-09-16 | Payer: Medicare HMO | Source: Home / Self Care | Admitting: Vascular Surgery

## 2023-09-16 ENCOUNTER — Other Ambulatory Visit: Payer: Self-pay

## 2023-09-16 DIAGNOSIS — R7303 Prediabetes: Secondary | ICD-10-CM | POA: Diagnosis present

## 2023-09-16 DIAGNOSIS — Z955 Presence of coronary angioplasty implant and graft: Secondary | ICD-10-CM

## 2023-09-16 DIAGNOSIS — I639 Cerebral infarction, unspecified: Secondary | ICD-10-CM | POA: Diagnosis present

## 2023-09-16 DIAGNOSIS — I2510 Atherosclerotic heart disease of native coronary artery without angina pectoris: Secondary | ICD-10-CM | POA: Diagnosis present

## 2023-09-16 DIAGNOSIS — M069 Rheumatoid arthritis, unspecified: Secondary | ICD-10-CM | POA: Diagnosis present

## 2023-09-16 DIAGNOSIS — Z8249 Family history of ischemic heart disease and other diseases of the circulatory system: Secondary | ICD-10-CM

## 2023-09-16 DIAGNOSIS — I953 Hypotension of hemodialysis: Secondary | ICD-10-CM | POA: Diagnosis not present

## 2023-09-16 DIAGNOSIS — I951 Orthostatic hypotension: Secondary | ICD-10-CM | POA: Diagnosis present

## 2023-09-16 DIAGNOSIS — Z87891 Personal history of nicotine dependence: Secondary | ICD-10-CM

## 2023-09-16 DIAGNOSIS — D631 Anemia in chronic kidney disease: Secondary | ICD-10-CM | POA: Diagnosis not present

## 2023-09-16 DIAGNOSIS — I10 Essential (primary) hypertension: Secondary | ICD-10-CM

## 2023-09-16 DIAGNOSIS — R600 Localized edema: Secondary | ICD-10-CM | POA: Diagnosis not present

## 2023-09-16 DIAGNOSIS — R197 Diarrhea, unspecified: Secondary | ICD-10-CM | POA: Diagnosis present

## 2023-09-16 DIAGNOSIS — L4050 Arthropathic psoriasis, unspecified: Secondary | ICD-10-CM | POA: Diagnosis present

## 2023-09-16 DIAGNOSIS — N186 End stage renal disease: Secondary | ICD-10-CM | POA: Diagnosis not present

## 2023-09-16 DIAGNOSIS — I69392 Facial weakness following cerebral infarction: Secondary | ICD-10-CM | POA: Diagnosis not present

## 2023-09-16 DIAGNOSIS — I120 Hypertensive chronic kidney disease with stage 5 chronic kidney disease or end stage renal disease: Secondary | ICD-10-CM | POA: Diagnosis not present

## 2023-09-16 DIAGNOSIS — I4891 Unspecified atrial fibrillation: Secondary | ICD-10-CM | POA: Diagnosis present

## 2023-09-16 DIAGNOSIS — Z8546 Personal history of malignant neoplasm of prostate: Secondary | ICD-10-CM | POA: Diagnosis not present

## 2023-09-16 DIAGNOSIS — I6930 Unspecified sequelae of cerebral infarction: Secondary | ICD-10-CM | POA: Diagnosis not present

## 2023-09-16 DIAGNOSIS — M549 Dorsalgia, unspecified: Secondary | ICD-10-CM | POA: Diagnosis present

## 2023-09-16 DIAGNOSIS — I6389 Other cerebral infarction: Principal | ICD-10-CM

## 2023-09-16 DIAGNOSIS — R531 Weakness: Secondary | ICD-10-CM | POA: Diagnosis not present

## 2023-09-16 DIAGNOSIS — R159 Full incontinence of feces: Secondary | ICD-10-CM | POA: Diagnosis present

## 2023-09-16 DIAGNOSIS — I959 Hypotension, unspecified: Secondary | ICD-10-CM | POA: Diagnosis not present

## 2023-09-16 DIAGNOSIS — Z79899 Other long term (current) drug therapy: Secondary | ICD-10-CM

## 2023-09-16 DIAGNOSIS — Z800 Family history of malignant neoplasm of digestive organs: Secondary | ICD-10-CM

## 2023-09-16 DIAGNOSIS — I252 Old myocardial infarction: Secondary | ICD-10-CM | POA: Diagnosis not present

## 2023-09-16 DIAGNOSIS — E785 Hyperlipidemia, unspecified: Secondary | ICD-10-CM | POA: Diagnosis present

## 2023-09-16 DIAGNOSIS — Z7982 Long term (current) use of aspirin: Secondary | ICD-10-CM

## 2023-09-16 DIAGNOSIS — Z8711 Personal history of peptic ulcer disease: Secondary | ICD-10-CM

## 2023-09-16 DIAGNOSIS — Z825 Family history of asthma and other chronic lower respiratory diseases: Secondary | ICD-10-CM

## 2023-09-16 DIAGNOSIS — N250 Renal osteodystrophy: Secondary | ICD-10-CM | POA: Diagnosis not present

## 2023-09-16 DIAGNOSIS — Z992 Dependence on renal dialysis: Secondary | ICD-10-CM

## 2023-09-16 DIAGNOSIS — N2581 Secondary hyperparathyroidism of renal origin: Secondary | ICD-10-CM | POA: Diagnosis not present

## 2023-09-16 DIAGNOSIS — Z833 Family history of diabetes mellitus: Secondary | ICD-10-CM

## 2023-09-16 DIAGNOSIS — Z8489 Family history of other specified conditions: Secondary | ICD-10-CM | POA: Diagnosis not present

## 2023-09-16 DIAGNOSIS — I69328 Other speech and language deficits following cerebral infarction: Secondary | ICD-10-CM

## 2023-09-16 DIAGNOSIS — I69354 Hemiplegia and hemiparesis following cerebral infarction affecting left non-dominant side: Secondary | ICD-10-CM | POA: Diagnosis not present

## 2023-09-16 DIAGNOSIS — G4700 Insomnia, unspecified: Secondary | ICD-10-CM | POA: Diagnosis not present

## 2023-09-16 DIAGNOSIS — M109 Gout, unspecified: Secondary | ICD-10-CM | POA: Diagnosis present

## 2023-09-16 DIAGNOSIS — Z803 Family history of malignant neoplasm of breast: Secondary | ICD-10-CM

## 2023-09-16 DIAGNOSIS — Z79620 Long term (current) use of immunosuppressive biologic: Secondary | ICD-10-CM

## 2023-09-16 LAB — BASIC METABOLIC PANEL
Anion gap: 16 — ABNORMAL HIGH (ref 5–15)
BUN: 79 mg/dL — ABNORMAL HIGH (ref 8–23)
CO2: 23 mmol/L (ref 22–32)
Calcium: 8.5 mg/dL — ABNORMAL LOW (ref 8.9–10.3)
Chloride: 93 mmol/L — ABNORMAL LOW (ref 98–111)
Creatinine, Ser: 10.23 mg/dL — ABNORMAL HIGH (ref 0.61–1.24)
GFR, Estimated: 5 mL/min — ABNORMAL LOW (ref >60)
Glucose, Bld: 111 mg/dL — ABNORMAL HIGH (ref 70–99)
Potassium: 3.9 mmol/L (ref 3.5–5.1)
Sodium: 132 mmol/L — ABNORMAL LOW (ref 135–145)

## 2023-09-16 LAB — CBC
HCT: 29.5 % — ABNORMAL LOW (ref 39.0–52.0)
Hemoglobin: 9.8 g/dL — ABNORMAL LOW (ref 13.0–17.0)
MCH: 29.2 pg (ref 26.0–34.0)
MCHC: 33.2 g/dL (ref 30.0–36.0)
MCV: 87.8 fL (ref 80.0–100.0)
Platelets: 181 10*3/uL (ref 150–400)
RBC: 3.36 MIL/uL — ABNORMAL LOW (ref 4.22–5.81)
RDW: 14.6 % (ref 11.5–15.5)
WBC: 9.7 10*3/uL (ref 4.0–10.5)
nRBC: 0 % (ref 0.0–0.2)

## 2023-09-16 SURGERY — ARTERIOVENOUS (AV) FISTULA CREATION
Anesthesia: Choice | Laterality: Left

## 2023-09-16 MED ORDER — CHLORHEXIDINE GLUCONATE CLOTH 2 % EX PADS
6.0000 | MEDICATED_PAD | Freq: Two times a day (BID) | CUTANEOUS | Status: DC
  Administered 2023-09-16 – 2023-09-17 (×2): 6 via TOPICAL

## 2023-09-16 MED ORDER — LIDOCAINE 5 % EX PTCH
1.0000 | MEDICATED_PATCH | CUTANEOUS | Status: DC
  Administered 2023-09-17 – 2023-09-25 (×9): 1 via TRANSDERMAL
  Filled 2023-09-16 (×9): qty 1

## 2023-09-16 MED ORDER — FOLIC ACID 1 MG PO TABS
0.5000 mg | ORAL_TABLET | Freq: Every day | ORAL | Status: DC
  Administered 2023-09-17 – 2023-09-25 (×9): 0.5 mg via ORAL
  Filled 2023-09-16 (×9): qty 1

## 2023-09-16 MED ORDER — HEPARIN SODIUM (PORCINE) 5000 UNIT/ML IJ SOLN
5000.0000 [IU] | Freq: Three times a day (TID) | INTRAMUSCULAR | Status: DC
  Administered 2023-09-16 – 2023-09-25 (×24): 5000 [IU] via SUBCUTANEOUS
  Filled 2023-09-16 (×24): qty 1

## 2023-09-16 MED ORDER — LIDOCAINE 5 % EX PTCH: 1.0000 | MEDICATED_PATCH | CUTANEOUS | 0 refills | Status: DC

## 2023-09-16 MED ORDER — HEPARIN SODIUM (PORCINE) 5000 UNIT/ML IJ SOLN: 5000.0000 [IU] | Freq: Three times a day (TID) | INTRAMUSCULAR | Status: DC

## 2023-09-16 MED ORDER — MUSCLE RUB 10-15 % EX CREA: TOPICAL_CREAM | CUTANEOUS | Status: DC | PRN

## 2023-09-16 MED ORDER — HYDRALAZINE HCL 50 MG PO TABS
50.0000 mg | ORAL_TABLET | Freq: Two times a day (BID) | ORAL | Status: DC
  Administered 2023-09-16: 50 mg via ORAL
  Filled 2023-09-16: qty 1

## 2023-09-16 MED ORDER — LOPERAMIDE HCL 2 MG PO CAPS: 2.0000 mg | ORAL_CAPSULE | Freq: Three times a day (TID) | ORAL | Status: DC

## 2023-09-16 MED ORDER — ONDANSETRON HCL 4 MG/2ML IJ SOLN: 4.0000 mg | Freq: Four times a day (QID) | INTRAMUSCULAR | Status: DC | PRN

## 2023-09-16 MED ORDER — MUSCLE RUB 10-15 % EX CREA: 1.0000 | TOPICAL_CREAM | CUTANEOUS | 0 refills | Status: DC | PRN

## 2023-09-16 MED ORDER — ROSUVASTATIN CALCIUM 20 MG PO TABS
40.0000 mg | ORAL_TABLET | Freq: Every day | ORAL | Status: DC
  Administered 2023-09-17 – 2023-09-25 (×9): 40 mg via ORAL
  Filled 2023-09-16 (×9): qty 2

## 2023-09-16 MED ORDER — SEVELAMER CARBONATE 800 MG PO TABS
800.0000 mg | ORAL_TABLET | Freq: Two times a day (BID) | ORAL | Status: DC
  Administered 2023-09-16 – 2023-09-25 (×17): 800 mg via ORAL
  Filled 2023-09-16 (×16): qty 1

## 2023-09-16 MED ORDER — ASPIRIN 81 MG PO TBEC: 81.0000 mg | DELAYED_RELEASE_TABLET | Freq: Every day | ORAL | 12 refills | Status: DC

## 2023-09-16 MED ORDER — ENSURE ENLIVE PO LIQD
237.0000 mL | Freq: Two times a day (BID) | ORAL | Status: DC
  Administered 2023-09-17 – 2023-09-22 (×7): 237 mL via ORAL

## 2023-09-16 MED ORDER — HEPARIN SODIUM (PORCINE) 1000 UNIT/ML IJ SOLN
INTRAMUSCULAR | Status: AC
  Administered 2023-09-16: 1000 [IU]
  Filled 2023-09-16: qty 4

## 2023-09-16 MED ORDER — ALLOPURINOL 100 MG PO TABS
100.0000 mg | ORAL_TABLET | Freq: Every day | ORAL | Status: DC
  Administered 2023-09-17 – 2023-09-25 (×9): 100 mg via ORAL
  Filled 2023-09-16 (×9): qty 1

## 2023-09-16 MED ORDER — HYDRALAZINE HCL 50 MG PO TABS: 50.0000 mg | ORAL_TABLET | Freq: Two times a day (BID) | ORAL | 0 refills | Status: DC

## 2023-09-16 MED ORDER — LEFLUNOMIDE 10 MG PO TABS
10.0000 mg | ORAL_TABLET | Freq: Every day | ORAL | Status: DC
  Administered 2023-09-17 – 2023-09-25 (×9): 10 mg via ORAL
  Filled 2023-09-16 (×9): qty 1

## 2023-09-16 MED ORDER — PANTOPRAZOLE SODIUM 40 MG PO TBEC
40.0000 mg | DELAYED_RELEASE_TABLET | Freq: Two times a day (BID) | ORAL | Status: DC
  Administered 2023-09-16 – 2023-09-25 (×18): 40 mg via ORAL
  Filled 2023-09-16 (×18): qty 1

## 2023-09-16 MED ORDER — LOPERAMIDE HCL 2 MG PO CAPS: 2.0000 mg | ORAL_CAPSULE | Freq: Three times a day (TID) | ORAL | 0 refills | Status: DC

## 2023-09-16 MED ORDER — ACETAMINOPHEN 325 MG PO TABS
325.0000 mg | ORAL_TABLET | ORAL | Status: DC | PRN
  Administered 2023-09-18 – 2023-09-22 (×6): 650 mg via ORAL
  Filled 2023-09-16 (×6): qty 2

## 2023-09-16 MED ORDER — AMLODIPINE BESYLATE 10 MG PO TABS
10.0000 mg | ORAL_TABLET | Freq: Every day | ORAL | Status: DC
  Administered 2023-09-18: 10 mg via ORAL
  Filled 2023-09-16: qty 1

## 2023-09-16 MED ORDER — CHLORHEXIDINE GLUCONATE CLOTH 2 % EX PADS: 6.0000 | MEDICATED_PAD | Freq: Every day | CUTANEOUS | Status: DC

## 2023-09-16 MED ORDER — ONDANSETRON HCL 4 MG PO TABS: 4.0000 mg | ORAL_TABLET | Freq: Four times a day (QID) | ORAL | Status: DC | PRN

## 2023-09-16 MED ORDER — CALCITRIOL 0.25 MCG PO CAPS
0.2500 ug | ORAL_CAPSULE | ORAL | Status: DC
  Administered 2023-09-17 – 2023-09-24 (×4): 0.25 ug via ORAL
  Filled 2023-09-16 (×4): qty 1

## 2023-09-16 MED ORDER — ASPIRIN 81 MG PO TBEC
81.0000 mg | DELAYED_RELEASE_TABLET | Freq: Every day | ORAL | Status: DC
  Administered 2023-09-17 – 2023-09-25 (×9): 81 mg via ORAL
  Filled 2023-09-16 (×9): qty 1

## 2023-09-16 NOTE — Plan of Care (Signed)

## 2023-09-16 NOTE — Progress Notes (Signed)
Physical Therapy Treatment Patient Details Name: Jacob Farmer MRN: [id number] DOB: [birth year] Today's Date: 09/16/2023   History of Present Illness 77 y.o. male admitted 9/26 with sudden L UE weakness and Lt facial droop and received TNK. 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 PMHx: MI, HTN, HLD, former smoker, ESRD on HD, PAF, CAD, prostate CA    PT Comments  Saw pt for PT today post HD.  He was able to sit up EOB but then became lightheaded and symptoms did not improve with time and AROM/AAROM exercises.  Unable to get BP in sitting as dynamap not in room.  Upon return to bed BP 126/75 and symptoms improving.  Suspect lightheaded due to HD and orthostatic hypotension. Noted pt likely to d/c to [facility] today.  Otherwise continue POC.     If plan is discharge home, recommend the following: A little help with walking and/or transfers;A little help with bathing/dressing/bathroom;Assistance with cooking/housework;Assist for transportation;Help with stairs or ramp for entrance   Can travel by private vehicle        Equipment Recommendations  Other (comment) (defer to next venue)    Recommendations for Other Services Rehab consult     Precautions / Restrictions Precautions Precautions: Fall Precaution Comments: L hemiparesis     Mobility  Bed Mobility Overal bed mobility: Needs Assistance Bed Mobility: Supine to Sit, Sit to Supine     Supine to sit: Min assist Sit to supine: Min assist   General bed mobility comments: Min A to stabilize and cues for scooting forward    Transfers Overall transfer level: Needs assistance   Transfers: Bed to chair/wheelchair/BSC            Lateral/Scoot Transfers: Min assist General transfer comment: Lateral scooting at EOB with min A and cues.  Not able to tolerate standing due to lightheadedness (see comments)    Ambulation/Gait                   Stairs             Wheelchair  Mobility     Tilt Bed    Modified Rankin (Stroke Patients Only)       Balance Overall balance assessment: Needs assistance Sitting-balance support: Feet supported, No upper extremity supported Sitting balance-Leahy Scale: Fair         Standing balance comment: not able to test - lightheaded                            Cognition Arousal: Alert Behavior During Therapy: WFL for tasks assessed/performed Overall Cognitive Status: Within Functional Limits for tasks assessed                                 General Comments: not formally assesed but following commands and oriented        Exercises      General Comments General comments (skin integrity, edema, etc.): Pt recently returned from HD with 1500 fluid removed today (had been longer since last session). Upon sitting pt was lightheaded.  Dynamap not in room, not safe to leave pt.  Had pt perform bil LE LAQ, marching, and L UE shoulder presee with AAROM all x 15.  Pt reports symptoms worsening.  Returned to supine.  Able to retrieve dynamap.  In supine , bp was 126/75 and symptoms improving  Pertinent Vitals/Pain Pain Assessment Pain Assessment: No/denies pain    Home Living                          Prior Function            PT Goals (current goals can now be found in the care plan section) Progress towards PT goals: Progressing toward goals    Frequency    Min 1X/week      PT Plan      Co-evaluation              AM-PAC PT "6 Clicks" Mobility   Outcome Measure  Help needed turning from your back to your side while in a flat bed without using bedrails?: None Help needed moving from lying on your back to sitting on the side of a flat bed without using bedrails?: A Little Help needed moving to and from a bed to a chair (including a wheelchair)?: A Little Help needed standing up from a chair using your arms (e.g., wheelchair or bedside chair)?: A Lot Help  needed to walk in hospital room?: A Lot Help needed climbing 3-5 steps with a railing? : A Lot 6 Click Score: 16    End of Session Equipment Utilized During Treatment: Gait belt Activity Tolerance: Treatment limited secondary to medical complications (Comment) Patient left: with call bell/phone within reach;in bed;with bed alarm set Nurse Communication: Mobility status PT Visit Diagnosis: Hemiplegia and hemiparesis;Unsteadiness on feet (R26.81);Muscle weakness (generalized) (M62.81);Other abnormalities of gait and mobility (R26.89) Hemiplegia - Right/Left: Left Hemiplegia - dominant/non-dominant: Non-dominant Hemiplegia - caused by: Cerebral infarction     Time: 1445-1500 PT Time Calculation (min) (ACUTE ONLY): 15 min  Charges:    $Therapeutic Activity: 8-22 mins PT General Charges $$ ACUTE PT VISIT: 1 Visit                     Anise Salvo, PT Acute Rehab Services [facility] [phone]    Rayetta Humphrey 09/16/2023, 3:28 PM

## 2023-09-16 NOTE — Progress Notes (Signed)
Pt receives out-pt HD at [facility] on MWF. Will assist as needed.   Melven Sartorius Renal Navigator [phone]

## 2023-09-16 NOTE — Progress Notes (Signed)
Inpatient Rehab Admissions Coordinator:  Received insurance authorization. There is a bed available for pt in CIR today. Dr. Roda Shutters aware and in agreement. Pt, pt's wife, TOC, and NSG made aware.  Wolfgang Phoenix, MS, CCC-SLP Admissions Coordinator [phone]

## 2023-09-16 NOTE — Progress Notes (Signed)
Patient ID: Jacob Farmer, male   DOB: [birth year], 77 y.o.   MRN: [id number]  INPATIENT REHABILITATION ADMISSION NOTE   Arrival Method: bed     Mental Orientation: x4   Assessment: see flowsheet   Skin: CDI   IV'S: Right internal jugular-HD   Pain: none reported   Tubes and Drains: n/a   Safety Measures: in place   Vital Signs: see flowsheet   Height and Weight: see flowsheet   Rehab Orientation: completed   Family: spouse at bedside

## 2023-09-16 NOTE — H&P (Signed)
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  He was given TNK to treat his stroke and he experienced improvement in symptoms.  A follow-up CT scan on 9/28 hemorrhagic transformation of his infarct was noted.  He was monitored in the ICU and transferred out on 9/29.  Follow-up CT head without change in the appearance of the hemorrhagic infarction in the right posterior frontal vertex.  As part of his workup, he underwent 2D echo with ejection fraction of 60 to 65% and grade 1 diastolic dysfunction.  Hemoglobin A1c 5.3%.  He was started on heparin subcutaneously for VTE prophylaxis.  He is now on aspirin 81 mg daily.  Past medical history significant for atrial fibrillation and cardiology was consulted during this admission.  They are considering Watchman device if the patient is able to tolerate short-term anticoagulation.  He will follow-up as an outpatient.  His past medical history is also significant for recent gastrointestinal bleed in May.  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  Platelet use discussed with GI service and okayed to start aspirin 81 mg daily.  The patient is maintained on amlodipine 10 mg daily and hydralazine 25 mg twice daily for hypertension.  The patient undergoes hemodialysis via tunneled dialysis catheter on Mondays Wednesdays and Fridays.  Nephrology will continue to follow.  Medications for rheumatoid arthritis continue.  He is noted loose stools and testing for C. difficile toxin is negative.  Currently receiving Loperamide 2 mg every 8 hours.  Is able to complete supine to sit with supervision and use of hospital bed features  yesterday.  He is self-limiting with gait distance is secondary to decreased balance, fatigue and incontinent bowel movement.The patient requires inpatient medicine and rehabilitation evaluations and services for ongoing dysfunction secondary to CVA with hemorrhagic conversion.     Pt reports LBM yesterday but was having diarrhea- required Imodium.  CDIff (-) Back hurting from same position in bed during HD.  Walked yesterday with therapy.  Denies dizziness, nausea or vomiting, but "HD makes me hoarse".  Forms a few drops of urine every 1-2 days, however is dark when saw last.      Review of Systems  Constitutional:  Positive for malaise/fatigue. Negative for chills and fever.  HENT: Negative.    Eyes: Negative.   Respiratory:  Negative for cough and shortness of breath.   Cardiovascular:  Negative for chest pain, palpitations and leg swelling.  Gastrointestinal:  Positive for diarrhea. Negative for constipation and nausea.  Genitourinary:        Almost anuric- a few drops/day  Musculoskeletal:  Positive for back pain. Negative for myalgias.  Skin: Negative.   Neurological:  Positive for focal weakness. Negative for sensory change.  Endo/Heme/Allergies: Negative.   Psychiatric/Behavioral: Negative.    All other systems reviewed and are negative.       Past Medical History:  Diagnosis Date   CAD (coronary artery disease)      Acute myocardial infarction treated with TPA in 1990; 1999-stents to circumflex and RCA; residual total occlusion of the left anterior descending; normal ejection fraction; stress nuclear in 2001-distal anteroseptal ischemia; normal LV function  CKD (chronic kidney disease) stage 4, GFR 15-29 ml/min (HCC)     First degree heart block     History of gastric ulcer     History of kidney stones     History of MI (myocardial infarction)      1990-  treated w/ TPA   Hyperlipidemia     Hypertension     Jaundice     OA (osteoarthritis)     Prediabetes      Prostate cancer (HCC)      Stage T1c , Gleason 3+4,  PSA 4.7,  vol 24cc--  scheduled for radiative seed implants   Psoriatic arthritis (HCC)     RA (rheumatoid arthritis) (HCC)      Dr. Beatrix Fetters   Sinus bradycardia     Wears dentures               Past Surgical History:  Procedure Laterality Date   BIOPSY   08/27/2019    Procedure: BIOPSY;  Surgeon: Malissa Hippo, MD;  [facility];  Service: Endoscopy;;  gastric   BIOPSY   04/03/2022    Procedure: BIOPSY;  Surgeon: Dolores Frame, MD;  [facility];  Service: Gastroenterology;;   BIOPSY   04/21/2023    Procedure: BIOPSY;  Surgeon: Lanelle Bal, DO;  [facility]: [facility];  Service: Endoscopy;;   callus removal Left 09/13/2022    left foot   CARDIOVASCULAR STRESS TEST   2001  per Dr Dietrich Pates clinic note    distal anteroseptal ischemia,  normal LVF   COLONOSCOPY   last one 2006 (approx)   COLONOSCOPY WITH PROPOFOL N/A 04/03/2022    Procedure: COLONOSCOPY WITH PROPOFOL;  Surgeon: Dolores Frame, MD;  [facility]: [facility];  Service: Gastroenterology;  Laterality: N/A;  945   CORONARY ANGIOPLASTY WITH STENT PLACEMENT   1999    DES x2  to CFX and RCA/  residual total occlusion LAD,  normal LVEF   ENTEROSCOPY N/A 04/24/2023    Procedure: ENTEROSCOPY;  Surgeon: Dolores Frame, MD;  [facility];  Service: Gastroenterology;  Laterality: N/A;   ESOPHAGOGASTRODUODENOSCOPY (EGD) WITH PROPOFOL N/A 08/27/2019    Procedure: ESOPHAGOGASTRODUODENOSCOPY (EGD) WITH PROPOFOL;  Surgeon: Malissa Hippo, MD;  [facility];  Service: Endoscopy;  Laterality: N/A;  10:10   ESOPHAGOGASTRODUODENOSCOPY (EGD) WITH PROPOFOL N/A 04/03/2022    Procedure: ESOPHAGOGASTRODUODENOSCOPY (EGD) WITH PROPOFOL;  Surgeon: Dolores Frame, MD;  [facility];  Service: Gastroenterology;  Laterality: N/A;   ESOPHAGOGASTRODUODENOSCOPY (EGD) WITH PROPOFOL N/A 02/16/2023     Procedure: ESOPHAGOGASTRODUODENOSCOPY (EGD) WITH PROPOFOL;  Surgeon: Dolores Frame, MD;  [facility];  Service: Gastroenterology;  Laterality: N/A;   ESOPHAGOGASTRODUODENOSCOPY (EGD) WITH PROPOFOL N/A 04/21/2023    Procedure: ESOPHAGOGASTRODUODENOSCOPY (EGD) WITH PROPOFOL;  Surgeon: Lanelle Bal, DO;  [facility];  Service: Endoscopy;  Laterality: N/A;   EYE SURGERY Bilateral      cataract   GIVENS CAPSULE STUDY N/A 04/22/2023    Procedure: GIVENS CAPSULE STUDY;  Surgeon: Dolores Frame, MD;  [facility];  Service: Gastroenterology;  Laterality: N/A;   HEMOSTASIS CLIP PLACEMENT   04/24/2023    Procedure: HEMOSTASIS CLIP PLACEMENT;  Surgeon: Dolores Frame, MD;  [facility];  Service: Gastroenterology;;   HOT HEMOSTASIS   04/03/2022    Procedure: HOT HEMOSTASIS (ARGON PLASMA COAGULATION/BICAP);  Surgeon: Marguerita Merles, Reuel Boom, MD;  [facility];  Service: Gastroenterology;;   HOT HEMOSTASIS   04/24/2023    Procedure: HOT HEMOSTASIS (ARGON PLASMA COAGULATION/BICAP);  Surgeon: Marguerita Merles, Reuel Boom, MD;  [facility];  Service: Gastroenterology;;   IR FLUORO GUIDE CV LINE RIGHT   05/30/2023   IR US GUIDE VASC ACCESS RIGHT   05/30/2023   POLYPECTOMY   04/03/2022    Procedure: POLYPECTOMY;  Surgeon: Dolores Frame, MD;  [facility];  Service: Gastroenterology;;   POSTERIOR CERVICAL FUSION/FORAMINOTOMY N/A 05/26/2017    Procedure: RIGHT C4-5 FORAMINOTOMY WITH EXCISION OF HERNIATED DISC;  Surgeon: Kerrin Champagne, MD;  Location: [facility];  Service: Orthopedics;  Laterality: N/A;   RADIOACTIVE SEED IMPLANT N/A 01/11/2016    Procedure: RADIOACTIVE SEED IMPLANT/BRACHYTHERAPY IMPLANT;  Surgeon: Marcine Matar, MD;  Location: [facility];  Service: Urology;  Laterality: N/A;   73  seeds implanted no seeds founds in bladder   SCLEROTHERAPY   04/24/2023    Procedure:  SCLEROTHERAPY;  Surgeon: Marguerita Merles, Reuel Boom, MD;  [facility];  Service: Gastroenterology;;   SUBMUCOSAL TATTOO INJECTION   04/24/2023    Procedure: SUBMUCOSAL TATTOO INJECTION;  Surgeon: Marguerita Merles, Reuel Boom, MD;  [facility];  Service: Gastroenterology;;             Family History  Problem Relation Age of Onset   Heart attack Mother     Coronary artery disease Father     Ulcerative colitis Father     Ulcers Father     Breast cancer Sister     COPD Brother     Pancreatic cancer Brother     Healthy Sister     Diabetes Brother     Cirrhosis Son     Seizures Son          Social History:  reports that he quit smoking about 34 years ago. His smoking use included cigarettes. He started smoking about 64 years ago. He has a 30 pack-year smoking history. He has never been exposed to tobacco smoke. He has never used smokeless tobacco. He reports current alcohol use. He reports that he does not use drugs. Allergies:  Allergies  No Known Allergies            Facility-Administered Medications Prior to Admission  Medication Dose Route Frequency Provider Last Rate Last Admin   degarelix (FIRMAGON) injection 240 mg  240 mg Subcutaneous Once Marcine Matar, MD                Medications Prior to Admission  Medication Sig Dispense Refill   allopurinol (ZYLOPRIM) 100 MG tablet TAKE 1/2 TABLET EVERY OTHER DAY 23 tablet 0   amLODipine (NORVASC) 10 MG tablet Take 1 tablet (10 mg total) by mouth daily. 90 tablet 3   calcitRIOL (ROCALTROL) 0.25 MCG capsule Take 0.25 mcg by mouth every Monday, Wednesday, and Friday.       Dupilumab (DUPIXENT) 300 MG/2ML SOPN Inject 300 mg into the skin every 14 (fourteen) days. Starting at day 15 for maintenance. 12 mL 1   folic acid (FOLVITE) 400 MCG tablet Take 400 mcg by mouth daily.       hydrALAZINE (APRESOLINE) 25 MG tablet Take 1 tablet (25 mg total) by mouth 2 (two) times daily. 180 tablet 3   leflunomide (ARAVA) 10 MG  tablet TAKE 1 TABLET EVERY DAY 90 tablet 0   Leuprolide Acetate (ELIGARD [place]) Inject 1 Dose into the skin every 4 (four) months.  nitroGLYCERIN (NITROSTAT) 0.4 MG SL tablet Place 0.4 mg under the tongue every 5 (five) minutes as needed for chest pain.        pantoprazole (PROTONIX) 40 MG tablet TAKE 1 TABLET TWICE DAILY 180 tablet 1   RENVELA 800 MG tablet Take 800 mg by mouth 2 (two) times daily with a meal.       rosuvastatin (CRESTOR) 40 MG tablet Take 1 tablet (40 mg total) by mouth daily. 90 tablet 3   triamcinolone cream (KENALOG) 0.1 % Apply 1 Application topically daily. (Patient taking differently: Apply 1 Application topically daily as needed (irritation).)                Home: Home Living Family/patient expects to be discharged to:: Private residence Living Arrangements: Spouse/significant other Available Help at Discharge: Family, Available 24 hours/day Type of Home: House Home Access: Stairs to enter [employer] of Steps: 2 Entrance Stairs-Rails: None Home Layout: One level Bathroom Shower/Tub: Health visitor: Pharmacist, community: Yes Home Equipment: Occupational hygienist (4 wheels), Shower seat - built in, [place] - single point, Agricultural consultant (2 wheels)  Lives With: Spouse   Functional History: Prior Function Prior Level of Function : Independent/Modified Independent, Driving Mobility Comments: Community ambulator without AD, drives ADLs Comments: Independent   Functional Status:  Mobility: Bed Mobility Overal bed mobility: Needs Assistance Bed Mobility: Supine to Sit Supine to sit: Supervision, HOB elevated, Used rails Sit to supine: Contact guard assist General bed mobility comments: cues for technique for supine to sit using R hand on rail and some assist to lift trunk; to supine assist for positioning Transfers Overall transfer level: Needs assistance Equipment used: None, Rolling walker (2 wheels) Transfers: Sit to/from  Stand, Bed to chair/wheelchair/BSC Sit to Stand: Contact guard assist Bed to/from chair/wheelchair/BSC transfer type:: Step pivot Step pivot transfers: Min assist General transfer comment: education re: hand placement when transferring STS with RW Ambulation/Gait Ambulation/Gait assistance: Min assist Gait Distance (Feet): 35 Feet (+ 40 ft) Assistive device: Straight cane, Rolling walker (2 wheels) Gait Pattern/deviations: Decreased stride length, Decreased dorsiflexion - right, Decreased dorsiflexion - left, Decreased step length - left, Decreased step length - right General Gait Details: Pt ambulates with SPC into hallway & back then with RW. Pt requires min assist overall 2/2 decreased balance, assistance maintaining neutral L wrist positioning when grasping RW with LUE. Pt self limits gait distance 2/2 decreased balance then incontinent BM. Gait velocity: decreased   ADL: ADL Overall ADL's : Needs assistance/impaired Grooming: Minimal assistance, Sitting Grooming Details (indicate cue type and reason): for bil tasks Upper Body Bathing: Moderate assistance Lower Body Bathing: Sit to/from stand, Moderate assistance Upper Body Dressing : Moderate assistance, Sitting Lower Body Dressing: Maximal assistance, Sit to/from stand Toilet Transfer: Minimal assistance, Stand-pivot, Rolling walker (2 wheels) Toileting- Clothing Manipulation and Hygiene: Total assistance, Bed level Toileting - Clothing Manipulation Details (indicate cue type and reason): Pt with BM at EOB and seemingly poor awareness. pt with max request not to stand again so performing at bed level Functional mobility during ADLs: Minimal assistance, Rolling walker (2 wheels) (assist for balance and RW management)   Cognition: Cognition Overall Cognitive Status: Within Functional Limits for tasks assessed Arousal/Alertness: Awake/alert (slightly lethargic) Orientation Level: Oriented X4 Year: 2024 Day of Week:  Correct Attention: Sustained Focused Attention: Appears intact Sustained Attention: Appears intact Memory: Appears intact Awareness: Appears intact Problem Solving: Appears intact Executive Function: Organizing Sequencing: Appears intact Organizing: Appears intact Safety/Judgment: Appears intact Cognition Arousal:  Alert Behavior During Therapy: Flat affect Overall Cognitive Status: Within Functional Limits for tasks assessed General Comments: decreased attention to L side of body   Physical Exam: Blood pressure 127/76, pulse 68, temperature 97.8 F (36.6 C), resp. rate 18, height 5\' 10"  (1.778 m), weight (S) 66.9 kg, SpO2 100%. Physical Exam Vitals and nursing note reviewed. Exam conducted with a chaperone present.  Constitutional:      Appearance: Normal appearance. He is normal weight.     Comments: Almost supine in HD bed- awake, alert, appropriate, NAD Hooked up to HD-   HENT:     Head: Normocephalic and atraumatic.     Comments: L facial droop Tongue midline Facial sensation intact    Right Ear: External ear normal.     Left Ear: External ear normal.     Nose: Nose normal. No congestion.     Mouth/Throat:     Mouth: Mucous membranes are moist.     Pharynx: Oropharynx is clear. No oropharyngeal exudate.  Eyes:     General:        Right eye: No discharge.        Left eye: No discharge.     Extraocular Movements: Extraocular movements intact.  Cardiovascular:     Rate and Rhythm: Normal rate. Rhythm irregular.     Heart sounds: Normal heart sounds. No murmur heard. Pulmonary:     Effort: Pulmonary effort is normal. No respiratory distress.     Breath sounds: Normal breath sounds. No wheezing, rhonchi or rales.  Abdominal:     General: Abdomen is flat.     Palpations: Abdomen is soft.     Comments: Slightly hyperactive BS  Musculoskeletal:     Cervical back: Neck supple. No tenderness.     Comments: R side 5/5 in biceps, triceps, WE< grip and FA as well as HF,  KE, KF, DF and PF LUE and LLE- 5-/5 throughout in same muscles tested  Skin:    General: Skin is warm and dry.     Comments: No LE edema B/L Ecchmoses B/L Ue's R chest HD catheter- looks OK L AC fossa IV- looks OK Getting HD  Neurological:     Mental Status: He is alert.     Comments: Intact to light touch in all 4 extremities No spasticity seen No clonus Ox3 Intact naming 3/3 and repetition   Psychiatric:     Comments: Slightly flat        Lab Results Last 48 Hours        Results for orders placed or performed during the hospital encounter of 09/11/23 (from the past 48 hour(s))  CBC     Status: Abnormal    Collection Time: 09/15/23  7:38 AM  Result Value Ref Range    WBC 13.6 (H) 4.0 - 10.5 K/uL    RBC 3.28 (L) 4.22 - 5.81 MIL/uL    Hemoglobin 9.7 (L) 13.0 - 17.0 g/dL    HCT 10.2 (L) 72.5 - 52.0 %    MCV 90.2 80.0 - 100.0 fL    MCH 29.6 26.0 - 34.0 pg    MCHC 32.8 30.0 - 36.0 g/dL    RDW 36.6 44.0 - 34.7 %    Platelets 175 150 - 400 K/uL    nRBC 0.0 0.0 - 0.2 %      Comment: Performed at [facility], [address] [place], [place] [address]  Basic metabolic panel     Status: Abnormal  Collection Time: 09/15/23  7:38 AM  Result Value Ref Range    Sodium 134 (L) 135 - 145 mmol/L    Potassium 3.9 3.5 - 5.1 mmol/L    Chloride 94 (L) 98 - 111 mmol/L    CO2 23 22 - 32 mmol/L    Glucose, Bld 107 (H) 70 - 99 mg/dL      Comment: Glucose reference range applies only to samples taken after fasting for at least 8 hours.    BUN 66 (H) 8 - 23 mg/dL    Creatinine, Ser 2.72 (H) 0.61 - 1.24 mg/dL    Calcium 8.6 (L) 8.9 - 10.3 mg/dL    GFR, Estimated 6 (L) >60 mL/min      Comment: (NOTE) Calculated using the CKD-EPI Creatinine Equation (2021)      Anion gap 17 (H) 5 - 15      Comment: Performed at [facility], [address] [place], [place] [address]  CBC     Status: Abnormal    Collection Time: 09/16/23  6:14 AM  Result Value Ref Range    WBC 9.7 4.0 -  10.5 K/uL    RBC 3.36 (L) 4.22 - 5.81 MIL/uL    Hemoglobin 9.8 (L) 13.0 - 17.0 g/dL    HCT 40.3 (L) 47.4 - 52.0 %    MCV 87.8 80.0 - 100.0 fL    MCH 29.2 26.0 - 34.0 pg    MCHC 33.2 30.0 - 36.0 g/dL    RDW 25.9 56.3 - 87.5 %    Platelets 181 150 - 400 K/uL    nRBC 0.0 0.0 - 0.2 %      Comment: Performed at [facility], [address], [place], [place] [address]  Basic metabolic panel     Status: Abnormal    Collection Time: 09/16/23  6:14 AM  Result Value Ref Range    Sodium 132 (L) 135 - 145 mmol/L    Potassium 3.9 3.5 - 5.1 mmol/L    Chloride 93 (L) 98 - 111 mmol/L    CO2 23 22 - 32 mmol/L    Glucose, Bld 111 (H) 70 - 99 mg/dL      Comment: Glucose reference range applies only to samples taken after fasting for at least 8 hours.    BUN 79 (H) 8 - 23 mg/dL    Creatinine, Ser 95.18 (H) 0.61 - 1.24 mg/dL    Calcium 8.5 (L) 8.9 - 10.3 mg/dL    GFR, Estimated 5 (L) >60 mL/min      Comment: (NOTE) Calculated using the CKD-EPI Creatinine Equation (2021)      Anion gap 16 (H) 5 - 15      Comment: Performed at [facility], [address] [place], [place] [address]      Imaging Results (Last 48 hours)  No results found.         Blood pressure 127/76, pulse 68, temperature 97.8 F (36.6 C), resp. rate 18, height 5\' 10"  (1.778 m), weight (S) 66.9 kg, SpO2 100%.   Medical Problem List and Plan: 1. Functional deficits secondary to parietal lobe infarct on Right with ICH s/p TNK             -patient may not shower due to HD catheter             -ELOS/Goals: 12-14 days mod I             Admit to CIR  2.  Antithrombotics: -DVT/anticoagulation:  Pharmaceutical: Heparin             -antiplatelet therapy: aspirin 81 mg   3. Pain Management: Tylenol as needed   4. Mood/Behavior/Sleep: LCSW to evaluate and provide emotional support             -antipsychotic agents: n/a   5. Neuropsych/cognition: This patient appears capable of making decisions on his own behalf.    6. Skin/Wound Care: Routine skin care checks   7. Fluids/Electrolytes/Nutrition: Routine Is and Os and follow-up chemistries.  Routine labs as per nephrology.             -continue Calcitrol on Monday Wednesday and Fridays             -continue Folvite and Renvela   8: Hypertension: monitor TID and prn             -continue amlodipine 10 mg daily             -continue hydralazine 50 mg BID   9: Hyperlipidemia: continue statin   10: History of gout: Continue allopurinol 100 mg daily   11: Diarrhea: C. difficile is negative.  Continue loperamide 2 mg every 8 hours.  Consider probiotics, fiber supplement.   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 ESRD- HD per renal             Milinda Antis, PA-C 09/16/2023     I have personally performed a face to face diagnostic evaluation of this patient and formulated the key components of the plan.  Additionally, I have personally reviewed laboratory data, imaging studies, as well as relevant notes and concur with the physician assistant's documentation above.   The patient's status has not changed from the original H&P.  Any changes in documentation from the acute care chart have been noted above.

## 2023-09-16 NOTE — Progress Notes (Signed)
Per MD Malen Gauze; paged MD [place].

## 2023-09-16 NOTE — Plan of Care (Signed)
  Problem: Education: Goal: Knowledge of General Education information will improve Description: Including pain rating scale, medication(s)/side effects and non-pharmacologic comfort measures Outcome: Progressing   Problem: Health Behavior/Discharge Planning: Goal: Ability to manage health-related needs will improve Outcome: Progressing   Problem: Clinical Measurements: Goal: Ability to maintain clinical measurements within normal limits will improve Outcome: Progressing Goal: Will remain free from infection Outcome: Progressing Goal: Diagnostic test results will improve Outcome: Progressing Goal: Respiratory complications will improve Outcome: Progressing Goal: Cardiovascular complication will be avoided Outcome: Progressing   Problem: Activity: Goal: Risk for activity intolerance will decrease Outcome: Progressing   Problem: Nutrition: Goal: Adequate nutrition will be maintained Outcome: Progressing   Problem: Coping: Goal: Level of anxiety will decrease Outcome: Progressing   Problem: Elimination: Goal: Will not experience complications related to bowel motility Outcome: Progressing Goal: Will not experience complications related to urinary retention Outcome: Progressing   Problem: Pain Managment: Goal: General experience of comfort will improve Outcome: Progressing   Problem: Ischemic Stroke/TIA Tissue Perfusion: Goal: Complications of ischemic stroke/TIA will be minimized Outcome: Progressing

## 2023-09-16 NOTE — Progress Notes (Signed)
Nephrology MD Malen Gauze paged. Pt due for dialysis and needs appropriate orders.   Creatinine 10.23.

## 2023-09-16 NOTE — Plan of Care (Signed)
Problem: Education: Goal: Knowledge of General Education information will improve Description: Including pain rating scale, medication(s)/side effects and non-pharmacologic comfort measures 09/16/2023 1504 by Juluis Mire, RN Outcome: Adequate for Discharge 09/16/2023 1504 by Juluis Mire, RN Outcome: Adequate for Discharge 09/16/2023 1305 by Juluis Mire, RN Outcome: Progressing   Problem: Health Behavior/Discharge Planning: Goal: Ability to manage health-related needs will improve 09/16/2023 1504 by Juluis Mire, RN Outcome: Adequate for Discharge 09/16/2023 1504 by Juluis Mire, RN Outcome: Adequate for Discharge 09/16/2023 1305 by Juluis Mire, RN Outcome: Progressing   Problem: Clinical Measurements: Goal: Ability to maintain clinical measurements within normal limits will improve 09/16/2023 1504 by Juluis Mire, RN Outcome: Adequate for Discharge 09/16/2023 1504 by Juluis Mire, RN Outcome: Adequate for Discharge 09/16/2023 1305 by Juluis Mire, RN Outcome: Progressing Goal: Will remain free from infection 09/16/2023 1504 by Juluis Mire, RN Outcome: Adequate for Discharge 09/16/2023 1504 by Juluis Mire, RN Outcome: Adequate for Discharge 09/16/2023 1305 by Juluis Mire, RN Outcome: Progressing Goal: Diagnostic test results will improve 09/16/2023 1504 by Juluis Mire, RN Outcome: Adequate for Discharge 09/16/2023 1504 by Juluis Mire, RN Outcome: Adequate for Discharge 09/16/2023 1305 by Juluis Mire, RN Outcome: Progressing Goal: Respiratory complications will improve 09/16/2023 1504 by Juluis Mire, RN Outcome: Adequate for Discharge 09/16/2023 1504 by Juluis Mire, RN Outcome: Adequate for Discharge 09/16/2023 1305 by Juluis Mire, RN Outcome: Progressing Goal: Cardiovascular complication will be avoided 09/16/2023 1504 by Juluis Mire,  RN Outcome: Adequate for Discharge 09/16/2023 1504 by Juluis Mire, RN Outcome: Adequate for Discharge 09/16/2023 1305 by Juluis Mire, RN Outcome: Progressing   Problem: Activity: Goal: Risk for activity intolerance will decrease 09/16/2023 1504 by Juluis Mire, RN Outcome: Adequate for Discharge 09/16/2023 1504 by Juluis Mire, RN Outcome: Adequate for Discharge 09/16/2023 1305 by Juluis Mire, RN Outcome: Progressing   Problem: Nutrition: Goal: Adequate nutrition will be maintained 09/16/2023 1504 by Juluis Mire, RN Outcome: Adequate for Discharge 09/16/2023 1504 by Juluis Mire, RN Outcome: Adequate for Discharge 09/16/2023 1305 by Juluis Mire, RN Outcome: Progressing   Problem: Coping: Goal: Level of anxiety will decrease 09/16/2023 1504 by Juluis Mire, RN Outcome: Adequate for Discharge 09/16/2023 1504 by Juluis Mire, RN Outcome: Adequate for Discharge 09/16/2023 1305 by Juluis Mire, RN Outcome: Progressing   Problem: Elimination: Goal: Will not experience complications related to bowel motility 09/16/2023 1504 by Juluis Mire, RN Outcome: Adequate for Discharge 09/16/2023 1504 by Juluis Mire, RN Outcome: Adequate for Discharge 09/16/2023 1305 by Juluis Mire, RN Outcome: Progressing Goal: Will not experience complications related to urinary retention 09/16/2023 1504 by Juluis Mire, RN Outcome: Adequate for Discharge 09/16/2023 1504 by Juluis Mire, RN Outcome: Adequate for Discharge 09/16/2023 1305 by Juluis Mire, RN Outcome: Progressing   Problem: Pain Managment: Goal: General experience of comfort will improve 09/16/2023 1504 by Juluis Mire, RN Outcome: Adequate for Discharge 09/16/2023 1504 by Juluis Mire, RN Outcome: Adequate for Discharge 09/16/2023 1305 by Juluis Mire, RN Outcome: Progressing   Problem:  Safety: Goal: Ability to remain free from injury will improve 09/16/2023 1504 by Juluis Mire, RN Outcome: Adequate for Discharge 09/16/2023 1504 by Juluis Mire, RN Outcome: Adequate for Discharge 09/16/2023 1305 by Juluis Mire, RN Outcome: Progressing   Problem: Skin Integrity: Goal: Risk for impaired skin integrity will  decrease 09/16/2023 1504 by Juluis Mire, RN Outcome: Adequate for Discharge 09/16/2023 1504 by Juluis Mire, RN Outcome: Adequate for Discharge 09/16/2023 1305 by Juluis Mire, RN Outcome: Progressing   Problem: Education: Goal: Knowledge of disease or condition will improve 09/16/2023 1504 by Juluis Mire, RN Outcome: Adequate for Discharge 09/16/2023 1504 by Juluis Mire, RN Outcome: Adequate for Discharge 09/16/2023 1305 by Juluis Mire, RN Outcome: Progressing Goal: Knowledge of secondary prevention will improve (MUST DOCUMENT ALL) 09/16/2023 1504 by Juluis Mire, RN Outcome: Adequate for Discharge 09/16/2023 1504 by Juluis Mire, RN Outcome: Adequate for Discharge 09/16/2023 1305 by Juluis Mire, RN Outcome: Progressing Goal: Knowledge of patient specific risk factors will improve Loraine Leriche N/A or DELETE if not current risk factor) 09/16/2023 1504 by Juluis Mire, RN Outcome: Adequate for Discharge 09/16/2023 1504 by Juluis Mire, RN Outcome: Adequate for Discharge 09/16/2023 1305 by Juluis Mire, RN Outcome: Progressing   Problem: Ischemic Stroke/TIA Tissue Perfusion: Goal: Complications of ischemic stroke/TIA will be minimized 09/16/2023 1504 by Juluis Mire, RN Outcome: Adequate for Discharge 09/16/2023 1504 by Juluis Mire, RN Outcome: Adequate for Discharge 09/16/2023 1305 by Juluis Mire, RN Outcome: Progressing   Problem: Coping: Goal: Will verbalize positive feelings about self 09/16/2023 1504 by Juluis Mire, RN Outcome:  Adequate for Discharge 09/16/2023 1504 by Juluis Mire, RN Outcome: Adequate for Discharge 09/16/2023 1305 by Juluis Mire, RN Outcome: Progressing Goal: Will identify appropriate support needs 09/16/2023 1504 by Juluis Mire, RN Outcome: Adequate for Discharge 09/16/2023 1504 by Juluis Mire, RN Outcome: Adequate for Discharge 09/16/2023 1305 by Juluis Mire, RN Outcome: Progressing   Problem: Health Behavior/Discharge Planning: Goal: Ability to manage health-related needs will improve 09/16/2023 1504 by Juluis Mire, RN Outcome: Adequate for Discharge 09/16/2023 1504 by Juluis Mire, RN Outcome: Adequate for Discharge 09/16/2023 1305 by Juluis Mire, RN Outcome: Progressing Goal: Goals will be collaboratively established with patient/family 09/16/2023 1504 by Juluis Mire, RN Outcome: Adequate for Discharge 09/16/2023 1504 by Juluis Mire, RN Outcome: Adequate for Discharge 09/16/2023 1305 by Juluis Mire, RN Outcome: Progressing   Problem: Self-Care: Goal: Ability to participate in self-care as condition permits will improve 09/16/2023 1504 by Juluis Mire, RN Outcome: Adequate for Discharge 09/16/2023 1504 by Juluis Mire, RN Outcome: Adequate for Discharge 09/16/2023 1305 by Juluis Mire, RN Outcome: Progressing Goal: Verbalization of feelings and concerns over difficulty with self-care will improve 09/16/2023 1504 by Juluis Mire, RN Outcome: Adequate for Discharge 09/16/2023 1504 by Juluis Mire, RN Outcome: Adequate for Discharge 09/16/2023 1305 by Juluis Mire, RN Outcome: Progressing Goal: Ability to communicate needs accurately will improve 09/16/2023 1504 by Juluis Mire, RN Outcome: Adequate for Discharge 09/16/2023 1504 by Juluis Mire, RN Outcome: Adequate for Discharge 09/16/2023 1305 by Juluis Mire, RN Outcome: Progressing    Problem: Nutrition: Goal: Risk of aspiration will decrease 09/16/2023 1504 by Juluis Mire, RN Outcome: Adequate for Discharge 09/16/2023 1504 by Juluis Mire, RN Outcome: Adequate for Discharge 09/16/2023 1305 by Juluis Mire, RN Outcome: Progressing Goal: Dietary intake will improve 09/16/2023 1504 by Juluis Mire, RN Outcome: Adequate for Discharge 09/16/2023 1504 by Juluis Mire, RN Outcome: Adequate for Discharge 09/16/2023 1305 by Juluis Mire, RN Outcome: Progressing   Problem: Education: Goal: Knowledge of disease and its progression will improve 09/16/2023 1504 by Juluis Mire,  RN Outcome: Adequate for Discharge 09/16/2023 1504 by Juluis Mire, RN Outcome: Adequate for Discharge   Problem: Fluid Volume: Goal: Compliance with measures to maintain balanced fluid volume will improve 09/16/2023 1504 by Juluis Mire, RN Outcome: Adequate for Discharge 09/16/2023 1504 by Juluis Mire, RN Outcome: Adequate for Discharge   Problem: Health Behavior/Discharge Planning: Goal: Ability to manage health-related needs will improve 09/16/2023 1504 by Juluis Mire, RN Outcome: Adequate for Discharge 09/16/2023 1504 by Juluis Mire, RN Outcome: Adequate for Discharge   Problem: Nutritional: Goal: Ability to make healthy dietary choices will improve 09/16/2023 1504 by Juluis Mire, RN Outcome: Adequate for Discharge 09/16/2023 1504 by Juluis Mire, RN Outcome: Adequate for Discharge   Problem: Clinical Measurements: Goal: Complications related to the disease process, condition or treatment will be avoided or minimized 09/16/2023 1504 by Juluis Mire, RN Outcome: Adequate for Discharge 09/16/2023 1504 by Juluis Mire, RN Outcome: Adequate for Discharge

## 2023-09-16 NOTE — Progress Notes (Signed)
Signed      Expand All Collapse All          Physical Medicine and Rehabilitation Consult Reason for Consult: Decreased functional mobility after stroke Referring Physician: Roda Shutters     HPI: Jacob Farmer is a 77 y.o. male with a history of MI, hypertension, end-stage renal disease on hemodialysis, atrial fibrillation not on anticoagulation due to history of bleeding jejunal ulcers and an AVM who presented on 09/11/2023 with acute onset left-sided weakness and numbness in addition to left facial droop.  Patient was given TNK and experienced improvement in symptoms.  CT of the head was without abnormality.  CTA demonstrated  50% stenosis of left ICA and severe stenosis at the origin of right vertebral artery and severe stenosis in the mid to distal left V4 segment.  MRI revealed small area of intraparenchymal hemorrhage within the right post central gyrus with mild surrounding edema as well as small foci of acute ischemia within the right parietal white matter at the base of the postcentral gyrus.  Patient was not placed on antithrombotic's due to hemorrhagic transformation.  Cardiology considering Watchman device.  Patient was up with therapy over the weekend and was min assist for sit to stand transfers and 60 feet of ambulation with 1 person hand-held assist.  Therapy notes significant ataxia on the left side.  Patient was min to max assist with ADLs.  Prior to this admission patient was independent and driving.  He lives at home with spouse in a 1 level home with 2 steps to enter.     Review of Systems  Constitutional: Negative.   HENT: Negative.    Eyes: Negative.   Respiratory: Negative.    Cardiovascular: Negative.   Gastrointestinal: Negative.   Genitourinary: Negative.   Musculoskeletal: Negative.   Skin: Negative.   Neurological:  Positive for dizziness, sensory change and focal weakness.  Psychiatric/Behavioral: Negative.          Past Medical History:  Diagnosis Date   CAD  (coronary artery disease)      Acute myocardial infarction treated with TPA in 1990; 1999-stents to circumflex and RCA; residual total occlusion of the left anterior descending; normal ejection fraction; stress nuclear in 2001-distal anteroseptal ischemia; normal LV function   CKD (chronic kidney disease) stage 4, GFR 15-29 ml/min (HCC)     First degree heart block     History of gastric ulcer     History of kidney stones     History of MI (myocardial infarction)      1990-  treated w/ TPA   Hyperlipidemia     Hypertension     Jaundice     OA (osteoarthritis)     Prediabetes     Prostate cancer (HCC)      Stage T1c , Gleason 3+4,  PSA 4.7,  vol 24cc--  scheduled for radiative seed implants   Psoriatic arthritis (HCC)     RA (rheumatoid arthritis) (HCC)      Dr. Beatrix Fetters   Sinus bradycardia     Wears dentures               Past Surgical History:  Procedure Laterality Date   BIOPSY   08/27/2019    Procedure: BIOPSY;  Surgeon: Malissa Hippo, MD;  [facility];  Service: Endoscopy;;  gastric   BIOPSY   04/03/2022    Procedure: BIOPSY;  Surgeon: Dolores Frame, MD;  [facility];  Service: Gastroenterology;;   BIOPSY  04/21/2023    Procedure: BIOPSY;  Surgeon: Lanelle Bal, DO;  [facility];  Service: Endoscopy;;   callus removal Left 09/13/2022    left foot   CARDIOVASCULAR STRESS TEST   2001  per Dr Dietrich Pates clinic note    distal anteroseptal ischemia,  normal LVF   COLONOSCOPY   last one 2006 (approx)   COLONOSCOPY WITH PROPOFOL N/A 04/03/2022    Procedure: COLONOSCOPY WITH PROPOFOL;  Surgeon: Dolores Frame, MD;  [facility];  Service: Gastroenterology;  Laterality: N/A;  945   CORONARY ANGIOPLASTY WITH STENT PLACEMENT   1999    DES x2  to CFX and RCA/  residual total occlusion LAD,  normal LVEF   ENTEROSCOPY N/A 04/24/2023    Procedure: ENTEROSCOPY;  Surgeon: Dolores Frame, MD;  [facility];  Service: Gastroenterology;  Laterality: N/A;   ESOPHAGOGASTRODUODENOSCOPY (EGD) WITH PROPOFOL N/A 08/27/2019    Procedure: ESOPHAGOGASTRODUODENOSCOPY (EGD) WITH PROPOFOL;  Surgeon: Malissa Hippo, MD;  [facility];  Service: Endoscopy;  Laterality: N/A;  10:10   ESOPHAGOGASTRODUODENOSCOPY (EGD) WITH PROPOFOL N/A 04/03/2022    Procedure: ESOPHAGOGASTRODUODENOSCOPY (EGD) WITH PROPOFOL;  Surgeon: Dolores Frame, MD;  [facility];  Service: Gastroenterology;  Laterality: N/A;   ESOPHAGOGASTRODUODENOSCOPY (EGD) WITH PROPOFOL N/A 02/16/2023    Procedure: ESOPHAGOGASTRODUODENOSCOPY (EGD) WITH PROPOFOL;  Surgeon: Dolores Frame, MD;  [facility];  Service: Gastroenterology;  Laterality: N/A;   ESOPHAGOGASTRODUODENOSCOPY (EGD) WITH PROPOFOL N/A 04/21/2023    Procedure: ESOPHAGOGASTRODUODENOSCOPY (EGD) WITH PROPOFOL;  Surgeon: Lanelle Bal, DO;  [facility];  Service: Endoscopy;  Laterality: N/A;   EYE SURGERY Bilateral      cataract   GIVENS CAPSULE STUDY N/A 04/22/2023    Procedure: GIVENS CAPSULE STUDY;  Surgeon: Dolores Frame, MD;  [facility];  Service: Gastroenterology;  Laterality: N/A;   HEMOSTASIS CLIP PLACEMENT   04/24/2023    Procedure: HEMOSTASIS CLIP PLACEMENT;  Surgeon: Dolores Frame, MD;  [facility];  Service: Gastroenterology;;   HOT HEMOSTASIS   04/03/2022    Procedure: HOT HEMOSTASIS (ARGON PLASMA COAGULATION/BICAP);  Surgeon: Marguerita Merles, Reuel Boom, MD;  [facility];  Service: Gastroenterology;;   HOT HEMOSTASIS   04/24/2023    Procedure: HOT HEMOSTASIS (ARGON PLASMA COAGULATION/BICAP);  Surgeon: Marguerita Merles, Reuel Boom, MD;  [facility];  Service: Gastroenterology;;   IR FLUORO GUIDE CV LINE RIGHT   05/30/2023   IR US GUIDE VASC ACCESS RIGHT   05/30/2023   POLYPECTOMY   04/03/2022    Procedure: POLYPECTOMY;  Surgeon: Dolores Frame, MD;  [facility];  Service: Gastroenterology;;   POSTERIOR CERVICAL FUSION/FORAMINOTOMY N/A 05/26/2017    Procedure: RIGHT C4-5 FORAMINOTOMY WITH EXCISION OF HERNIATED DISC;  Surgeon: Kerrin Champagne, MD;  [facility];  Service: Orthopedics;  Laterality: N/A;   RADIOACTIVE SEED IMPLANT N/A 01/11/2016    Procedure: RADIOACTIVE SEED IMPLANT/BRACHYTHERAPY IMPLANT;  Surgeon: Marcine Matar, MD;  Location: [facility];  Service: Urology;  Laterality: N/A;   73  seeds implanted no seeds founds in bladder   SCLEROTHERAPY   04/24/2023    Procedure: SCLEROTHERAPY;  Surgeon: Marguerita Merles, Reuel Boom, MD;  [facility];  Service: Gastroenterology;;   SUBMUCOSAL TATTOO INJECTION   04/24/2023    Procedure: SUBMUCOSAL TATTOO INJECTION;  Surgeon: Dolores Frame, MD;  [facility];  Service: Gastroenterology;;  Family History  Problem Relation Age of Onset   Heart attack Mother     Coronary artery disease Father     Ulcerative colitis Father     Ulcers Father     Breast cancer Sister     COPD Brother     Pancreatic cancer Brother     Healthy Sister     Diabetes Brother     Cirrhosis Son     Seizures Son          Social History:  reports that he quit smoking about 34 years ago. His smoking use included cigarettes. He started smoking about 64 years ago. He has a 30 pack-year smoking history. He has never been exposed to tobacco smoke. He has never used smokeless tobacco. He reports current alcohol use. He reports that he does not use drugs. Allergies:  Allergies  No Known Allergies            Facility-Administered Medications Prior to Admission  Medication Dose Route Frequency Provider Last Rate Last Admin   degarelix (FIRMAGON) injection 240 mg  240 mg Subcutaneous Once Marcine Matar, MD                Medications Prior to Admission  Medication Sig Dispense Refill   allopurinol (ZYLOPRIM) 100 MG  tablet TAKE 1/2 TABLET EVERY OTHER DAY 23 tablet 0   amLODipine (NORVASC) 10 MG tablet Take 1 tablet (10 mg total) by mouth daily. 90 tablet 3   calcitRIOL (ROCALTROL) 0.25 MCG capsule Take 0.25 mcg by mouth every Monday, Wednesday, and Friday.       Dupilumab (DUPIXENT) 300 MG/2ML SOPN Inject 300 mg into the skin every 14 (fourteen) days. Starting at day 15 for maintenance. 12 mL 1   folic acid (FOLVITE) 400 MCG tablet Take 400 mcg by mouth daily.       hydrALAZINE (APRESOLINE) 25 MG tablet Take 1 tablet (25 mg total) by mouth 2 (two) times daily. 180 tablet 3   leflunomide (ARAVA) 10 MG tablet TAKE 1 TABLET EVERY DAY 90 tablet 0   Leuprolide Acetate (ELIGARD Zimmerman) Inject 1 Dose into the skin every 4 (four) months.       nitroGLYCERIN (NITROSTAT) 0.4 MG SL tablet Place 0.4 mg under the tongue every 5 (five) minutes as needed for chest pain.        pantoprazole (PROTONIX) 40 MG tablet TAKE 1 TABLET TWICE DAILY 180 tablet 1   RENVELA 800 MG tablet Take 800 mg by mouth 2 (two) times daily with a meal.       rosuvastatin (CRESTOR) 40 MG tablet Take 1 tablet (40 mg total) by mouth daily. 90 tablet 3   triamcinolone cream (KENALOG) 0.1 % Apply 1 Application topically daily. (Patient taking differently: Apply 1 Application topically daily as needed (irritation).)            Home: Home Living Family/patient expects to be discharged to:: Private residence Living Arrangements: Spouse/significant other Available Help at Discharge: Family, Available 24 hours/day Type of Home: House Home Access: Stairs to enter [employer] of Steps: 2 Entrance Stairs-Rails: None Home Layout: One level Bathroom Shower/Tub: Health visitor: Standard Home Equipment: Occupational hygienist (4 wheels), Shower seat - built in, [place] - single point, Agricultural consultant (2 wheels)  Lives With: Spouse Jacob Farmer)  Functional History: Prior Function Prior Level of Function : Independent/Modified Independent,  Driving Mobility Comments: Community ambulator without AD, drives ADLs Comments: Independent Functional Status:  Mobility: Bed Mobility Overal bed mobility:  Needs Assistance Bed Mobility: Supine to Sit, Sit to Supine Supine to sit: Used rails, Contact guard Sit to supine: Contact guard assist General bed mobility comments: cues for technique for supine to sit using R hand on rail and some assist to lift trunk; to supine assist for positioning Transfers Overall transfer level: Needs assistance Equipment used: Rolling walker (2 wheels) Transfers: Sit to/from Stand Sit to Stand: Min assist General transfer comment: cues for technique, assist for balance with support at R wrist for placing UE on RW. Used RW for engagement in weightbearing on extended UE with support at wrist for joint stability Ambulation/Gait Ambulation/Gait assistance: Min assist Gait Distance (Feet): 60 Feet Assistive device: 1 person hand held assist Gait Pattern/deviations: Step-to pattern, Step-through pattern, Decreased stride length, Ataxic General Gait Details: mild ataxia with L LE incoordination; R HHA and increased time   ADL: ADL Overall ADL's : Needs assistance/impaired Grooming: Minimal assistance, Sitting Grooming Details (indicate cue type and reason): for bil tasks Upper Body Bathing: Moderate assistance Lower Body Bathing: Sit to/from stand, Moderate assistance Upper Body Dressing : Moderate assistance, Sitting Lower Body Dressing: Maximal assistance, Sit to/from stand Toilet Transfer: Minimal assistance, Stand-pivot, Rolling walker (2 wheels) Toileting- Clothing Manipulation and Hygiene: Total assistance, Bed level Toileting - Clothing Manipulation Details (indicate cue type and reason): Pt with BM at EOB and seemingly poor awareness. pt with max request not to stand again so performing at bed level Functional mobility during ADLs: Minimal assistance, Rolling walker (2 wheels) (assist for balance  and RW management)   Cognition: Cognition Overall Cognitive Status: Within Functional Limits for tasks assessed Arousal/Alertness: Awake/alert (slightly lethargic) Orientation Level: Oriented X4 Year: 2024 Day of Week: Correct Attention: Sustained Focused Attention: Appears intact Sustained Attention: Appears intact Memory: Appears intact Awareness: Appears intact Problem Solving: Appears intact Executive Function: Organizing Sequencing: Appears intact Organizing: Appears intact Safety/Judgment: Appears intact Cognition Arousal: Alert Behavior During Therapy: WFL for tasks assessed/performed Overall Cognitive Status: Within Functional Limits for tasks assessed General Comments: questionable Decr attention to L side of body   Blood pressure (!) 144/70, pulse 73, temperature 97.9 F (36.6 C), temperature source Oral, resp. rate 16, height 5\' 10"  (1.778 m), weight 70.3 kg, SpO2 100%. Physical Exam Constitutional:      Appearance: Normal appearance.  HENT:     Head: Normocephalic.     Right Ear: External ear normal.     Left Ear: External ear normal.     Nose: Nose normal.     Mouth/Throat:     Mouth: Mucous membranes are moist.  Eyes:     Extraocular Movements: Extraocular movements intact.     Pupils: Pupils are equal, round, and reactive to light.  Cardiovascular:     Rate and Rhythm: Normal rate.  Pulmonary:     Effort: Pulmonary effort is normal.  Abdominal:     Palpations: Abdomen is soft.  Musculoskeletal:        General: No swelling or deformity. Normal range of motion.     Cervical back: Normal range of motion.  Skin:    General: Skin is warm.  Neurological:     Mental Status: He is alert.     Comments: Alert and oriented x 3. Normal insight and awareness. Intact Memory. Normal language and speech sl dysarthric. Cranial nerve exam unremarkable except for mild left central 7. MMT: RUE 5/5. LUE 3+/5 prox to 4-/5 distally. RLE 5/5. LLE 4+/5. Sl sensory loss LUE.  No abnormal resting tone. DTR's 1+.  Psychiatric:        Mood and Affect: Mood normal.        Behavior: Behavior normal.        Lab Results Last 24 Hours       Results for orders placed or performed during the hospital encounter of 09/11/23 (from the past 24 hour(s))  CBC     Status: Abnormal    Collection Time: 09/15/23  7:38 AM  Result Value Ref Range    WBC 13.6 (H) 4.0 - 10.5 K/uL    RBC 3.28 (L) 4.22 - 5.81 MIL/uL    Hemoglobin 9.7 (L) 13.0 - 17.0 g/dL    HCT 13.2 (L) 44.0 - 52.0 %    MCV 90.2 80.0 - 100.0 fL    MCH 29.6 26.0 - 34.0 pg    MCHC 32.8 30.0 - 36.0 g/dL    RDW 10.2 72.5 - 36.6 %    Platelets 175 150 - 400 K/uL    nRBC 0.0 0.0 - 0.2 %  Basic metabolic panel     Status: Abnormal    Collection Time: 09/15/23  7:38 AM  Result Value Ref Range    Sodium 134 (L) 135 - 145 mmol/L    Potassium 3.9 3.5 - 5.1 mmol/L    Chloride 94 (L) 98 - 111 mmol/L    CO2 23 22 - 32 mmol/L    Glucose, Bld 107 (H) 70 - 99 mg/dL    BUN 66 (H) 8 - 23 mg/dL    Creatinine, Ser 4.40 (H) 0.61 - 1.24 mg/dL    Calcium 8.6 (L) 8.9 - 10.3 mg/dL    GFR, Estimated 6 (L) >60 mL/min    Anion gap 17 (H) 5 - 15       Imaging Results (Last 48 hours)  CT HEAD WO CONTRAST ( )   Result Date: 09/13/2023 CLINICAL DATA:  Follow-up stroke. Acute neurological deficit. Intraparenchymal hemorrhage right postcentral gyrus. Stroke in the right parietal white matter. EXAM: CT HEAD WITHOUT CONTRAST TECHNIQUE: Contiguous axial images were obtained from the base of the skull through the vertex without intravenous contrast. RADIATION DOSE REDUCTION: This exam was performed according to the departmental dose-optimization program which includes automated exposure control, adjustment of the mA and/or kV according to patient size and/or use of iterative reconstruction technique. COMPARISON:  MRI 09/12/2023 and CT 09/11/2023 FINDINGS: Brain: Probable artifactual low-density in the pons. No focal cerebellar finding.  Cerebral hemispheres show age related atrophy and chronic small-vessel ischemic change. Hemorrhagic infarction in the right posterior frontal vertex appears similar to the MRI study, with the hemorrhage measuring proximally 2.4 x 1.7 cm in size. Very small amount of adjacent subarachnoid hemorrhage within a sulcus. Other small near by right frontoparietal cortical infarctions are not visible by CT. No mass effect. No subdural hemorrhage. No hydrocephalus. Vascular: There is atherosclerotic calcification of the major vessels at the base of the brain. Skull: Negative Sinuses/Orbits: Clear/normal Other: None IMPRESSION: 1. Compared to the MRI of yesterday, no change in the appearance of a 2.4 x 1.7 cm hemorrhagic infarction in the right posterior frontal vertex is suspected. Very small amount of adjacent subarachnoid hemorrhage. No mass effect. 2. Other small near by right frontoparietal cortical infarctions shown by MRI are not visible by CT. 3. Age related atrophy and chronic small-vessel ischemic change of the cerebral hemispheric white matter. Electronically Signed   By: Paulina Fusi M.D.   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 Does the need for close, 24 hr/day medical supervision in concert with the patient's rehab needs make it unreasonable for this patient to be served in a less intensive setting? Yes Co-Morbidities requiring supervision/potential complications:  -Atrial fibrillation -Recent GI bleed -Hypertension -End-stage renal disease on hemodialysis Due to bladder management, bowel management, safety, skin/wound care, disease management, medication administration, pain management, and patient education, does the patient require 24 hr/day rehab nursing? Yes Does the patient require coordinated care of a physician, rehab nurse,  therapy disciplines of PT, OT to address physical and functional deficits in the context of the above medical diagnosis(es)? Yes Addressing deficits in the following areas: balance, endurance, locomotion, strength, transferring, bowel/bladder control, bathing, dressing, feeding, grooming, toileting, and psychosocial support Can the patient actively participate in an intensive therapy program of at least 3 hrs of therapy per day at least 5 days per week? Yes The potential for patient to make measurable gains while on inpatient rehab is excellent Anticipated functional outcomes upon discharge from inpatient rehab are modified independent  with PT, modified independent with OT, n/a with SLP. Estimated rehab length of stay to reach the above functional goals is: 7-9 days Anticipated discharge destination: Home Overall Rehab/Functional Prognosis: excellent   POST ACUTE RECOMMENDATIONS: This patient's condition is appropriate for continued rehabilitative care in the following setting: CIR Patient has agreed to participate in recommended program. Yes Note that insurance prior authorization may be required for reimbursement for recommended care.   Comment: Pt was active and independent prior to admit. Wife at home to help. Rehab Admissions Coordinator to follow up.         I have personally performed a face to face diagnostic evaluation of this patient. Additionally, I have examined the patient's medical record including any pertinent labs and radiographic images. If the physician assistant has documented in this note, I have reviewed and edited or otherwise concur with the physician assistant's documentation.   Thanks,   Ranelle Oyster, MD 09/15/2023

## 2023-09-16 NOTE — Progress Notes (Signed)
   09/16/23 1411  Vitals  Temp 97.8 F (36.6 C)  Pulse Rate 74  Resp 16  BP 120/76  SpO2 100 %  O2 Device Room Air  Oxygen Therapy  Patient Activity (if Appropriate) In bed  Pulse Oximetry Type Continuous  Post Treatment  Dialyzer Clearance Clear  Hemodialysis Intake (mL) 0 mL  Liters Processed 83.9  Fluid Removed (mL) 1500 mL  Tolerated HD Treatment Yes  Post-Hemodialysis Comments Pt. tolerated Procedure without difficulties. UF goal met. Report call to 3W bedside RN.   Received patient in bed to unit.  Alert and oriented.  Informed consent signed and in chart.   TX duration:3.5 Patient tolerated well.  Transported back to the room  Alert, without acute distress.  Hand-off given to patient's nurse.   Access used: Yes Access issues: No  Total UF removed: 1500 Medication(s) given: See MAR Post HD VS: See Above Grid Post HD weight: 63.5 kg   [facility]

## 2023-09-16 NOTE — Progress Notes (Signed)
[facility] Progress Note   Subjective:   Patient seen and examined at bedside in dialysis. Treatment rolled over due to increased number of patients and acuity.  Reports not sleeping well overnight.  Tolerated breakfast this AM, appetite a little better.  Denies CP, SOB, abdominal pain and n/v/d.   Objective Vitals:   09/16/23 1300 09/16/23 1330 09/16/23 1408 09/16/23 1411  BP: 129/80 122/84  120/76  Pulse: 73 76  74  Resp: 16 13  16   Temp:    97.8 F (36.6 C)  TempSrc:      SpO2: 99% 100%  100%  Weight:   (S) 63.5 kg   Height:       Physical Exam General:alert, elderly male in NAD Heart:RRR, no mrg Lungs:CTAB, nml WOB on RA Abdomen:soft, NTND Extremities:no LE edema Dialysis Access: [facility]   Filed Weights   09/11/23 2139 09/16/23 1014 09/16/23 1408  Weight: 70.3 kg (S) 66.9 kg (S) 63.5 kg    Intake/Output Summary (Last 24 hours) at 09/16/2023 1429 Last data filed at 09/16/2023 1411 Gross per 24 hour  Intake 240 ml  Output 1510 ml  Net -1270 ml    Additional Objective Labs: Basic Metabolic Panel: Recent Labs  Lab 09/14/23 0407 09/15/23 0738 09/16/23 0614  NA 133* 134* 132*  K 3.4* 3.9 3.9  CL 95* 94* 93*  CO2 25 23 23   GLUCOSE 131* 107* 111*  BUN 42* 66* 79*  CREATININE 5.58* 8.33* 10.23*  CALCIUM 9.1 8.6* 8.5*  PHOS 3.9  --   --    Liver Function Tests: Recent Labs  Lab 09/11/23 2133 09/14/23 0407  AST 20 86*  ALT 16 25  ALKPHOS 98 77  BILITOT 0.5 0.8  PROT 6.9 6.3*  ALBUMIN 3.6 3.1*   CBC: Recent Labs  Lab 09/11/23 2133 09/11/23 2136 09/15/23 0738 09/16/23 0614  WBC 6.1  --  13.6* 9.7  NEUTROABS 4.1  --   --   --   HGB 10.9* 11.2* 9.7* 9.8*  HCT 33.4* 33.0* 29.6* 29.5*  MCV 89.5  --  90.2 87.8  PLT 162  --  175 181    Medications:  clevidipine Stopped (09/12/23 1416)    allopurinol  100 mg Oral Daily   amLODipine  10 mg Oral Daily   aspirin EC  81 mg Oral Daily   calcitRIOL  0.25 mcg Oral Q M,W,F   Chlorhexidine  Gluconate Cloth  6 each Topical Q0600   feeding supplement  237 mL Oral BID BM   folic acid  0.5 mg Oral Daily   heparin injection (subcutaneous)  5,000 Units Subcutaneous Q8H   hydrALAZINE  50 mg Oral BID   leflunomide  10 mg Oral Daily   lidocaine  1 patch Transdermal Q24H   loperamide  2 mg Oral Q8H   pantoprazole  40 mg Oral BID   rosuvastatin  40 mg Oral Daily   sevelamer carbonate  800 mg Oral BID WC    Dialysis Orders: [facility] in [place]. RIJ TDC for the access.  MWF   Assessment/Plan: 1. 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 Cardioembolic in setting of A fib most likely per neuro. Off anticoagulation. Going to CIR. 2. ESRD -on HD MWF.  HD today d/t being rolled over yesterday, will have HD again tomorrow per regular schedule. NO HEPARIN.  3. Anemia of CKD- Hgb 9.8. Follow trends, may need to start ESA.   4. Secondary hyperparathyroidism -  Ca and phos at goal. Continue renvela when eating. 5. HTN/volume - BP ok. Home antihypertensive's restarted. Avoid hypotension. Does not appear volume overloaded, UF as tolerated 6. Nutrition - Renal diet w/fluid restrictions  Virgina Norfolk, PA-C [facility] 09/16/2023,2:29 PM  LOS: 5 days

## 2023-09-16 NOTE — TOC Transition Note (Signed)
Transition of Care [facility]) - CM/SW Discharge Note   Patient Details  Name: Jacob Farmer MRN: [id number] Date of Birth: 08-31-1946  Transition [facility]) CM/SW Contact:  Kermit Balo, RN Phone Number: 09/16/2023, 2:49 PM   Clinical Narrative:     Patient is discharging to CIR today. CM signing off.   Final next level of care: IP Rehab Facility Barriers to Discharge: No Barriers Identified   Patient Goals and CMS Choice CMS Medicare.gov Compare Post Acute Care list provided to:: Patient Choice offered to / list presented to : Patient  Discharge Placement                         Discharge Plan and Services Additional resources added to the After Visit Summary for                                       Social Determinants of Health (SDOH) Interventions SDOH Screenings   Food Insecurity: No Food Insecurity (04/28/2023)  Housing: Low Risk  (04/19/2023)  Transportation Needs: No Transportation Needs (04/28/2023)  Utilities: Not At Risk (04/19/2023)  Alcohol Screen: Low Risk  (09/14/2021)  Depression (PHQ2-9): Medium Risk (08/07/2023)  Financial Resource Strain: Low Risk  (02/18/2023)  Physical Activity: Sufficiently Active (09/14/2021)  Social Connections: Unknown (04/30/2022)   Received from [facility], [facility]  Stress: No Stress Concern Present (09/14/2021)  Tobacco Use: Medium Risk (09/11/2023)     Readmission Risk Interventions     No data to display

## 2023-09-16 NOTE — Progress Notes (Signed)
Signed     Expand All Collapse All PMR Admission Coordinator Pre-Admission Assessment   Patient: Jacob Farmer is an 77 y.o., male MRN: [id number] DOB: 1946/03/31 Height: 5\' 10"  (177.8 cm) Weight: 70.3 kg                                                                                                                                                  Insurance Information HMO: yes    PPO:      PCP:      IPA:      80/20:      OTHER:  PRIMARY: [employer] medicare      Policy#: [id number]      Subscriber: patient CM Name: Jacob Farmer      Phone#:  [phone] ext [phone]    Fax#: [phone] Pre-Cert#: [id number]   Received insurance approval on 09/16/23. Pt approved for 7 days beginning 09/16/23.   Employer:  Benefits:  Phone #: [phone]     Name:  Jacob Farmer: 12/16/2022     Deduct: $0      Out of Pocket Max: $3,600(met)        CIR: $295/day with a max copay of $2065 ( 7 days)      SNF: $20/day co pay for days 1-20 Outpatient: $25 copay/ visit       Home Health: 100% coverage      DME: 80% coverage          The "Data Collection Information Summary" for patients in Inpatient Rehabilitation Facilities with attached "Privacy Act Statement-Health Care Records" was provided and verbally reviewed with: Patient and Family   Emergency Contact Information Contact Information       Name Relation Home Work Mobile    Jacob Farmer Farmer [phone]   [phone]         Other Contacts   None on File      Current Medical History  Patient Admitting Diagnosis: CVA History of Present Illness: Jacob Farmer is a 77 y.o. male with a history of MI, hypertension, end-stage renal disease on hemodialysis, atrial fibrillation not on anticoagulation due to history of bleeding jejunal ulcers and an AVM who presented on 09/11/2023 with acute onset left-sided weakness and numbness in addition to left facial droop.  Patient was given TNK and experienced improvement in symptoms.  CT of the head was  without abnormality.  CTA demonstrated  50% stenosis of left ICA and severe stenosis at the origin of right vertebral artery and severe stenosis in the mid to distal left V4 segment.  MRI revealed small area of intraparenchymal hemorrhage within the right post central gyrus with mild surrounding edema as well as small foci of acute ischemia within the right parietal white matter at the base of the postcentral gyrus.  Patient was not placed on antithrombotic's due to hemorrhagic transformation.  Cardiology  considering Watchman device.  Complete NIHSS TOTAL: 2 Glasgow Coma Scale Score: 15   Patient's medical record from [facility] has been reviewed by the rehabilitation admission coordinator and physician.   Past Medical History      Past Medical History:  Diagnosis Farmer   CAD (coronary artery disease)      Acute myocardial infarction treated with TPA in 1990; 1999-stents to circumflex and RCA; residual total occlusion of the left anterior descending; normal ejection fraction; stress nuclear in 2001-distal anteroseptal ischemia; normal LV function   CKD (chronic kidney disease) stage 4, GFR 15-29 ml/min (HCC)     First degree heart block     History of gastric ulcer     History of kidney stones     History of MI (myocardial infarction)      1990-  treated w/ TPA   Hyperlipidemia     Hypertension     Jaundice     OA (osteoarthritis)     Prediabetes     Prostate cancer (HCC)      Stage T1c , Gleason 3+4,  PSA 4.7,  vol 24cc--  scheduled for radiative seed implants   Psoriatic arthritis (HCC)     RA (rheumatoid arthritis) (HCC)      Dr. Beatrix Farmer   Sinus bradycardia     Wears dentures            Has the patient had major surgery during 100 days prior to admission? No   Family History  family history includes Breast cancer in his sister; COPD in his brother; Cirrhosis in his son; Coronary artery disease in his father; Diabetes in his brother; Healthy in his sister; Heart attack in his  mother; Pancreatic cancer in his brother; Seizures in his son; Ulcerative colitis in his father; Ulcers in his father.     Current Medications   Current Medications    Current Facility-Administered Medications:    acetaminophen (TYLENOL) tablet 650 mg, 650 mg, Oral, Q4H PRN, 650 mg at 09/14/23 1612 **OR** acetaminophen (TYLENOL) 160 MG/5ML solution 650 mg, 650 mg, Per Tube, Q4H PRN **OR** acetaminophen (TYLENOL) suppository 650 mg, 650 mg, Rectal, Q4H PRN, Jacob Blinks, MD   allopurinol (ZYLOPRIM) tablet 100 mg, 100 mg, Oral, Daily, Pearlean Brownie, Pramod S, MD, 100 mg at 09/15/23 0947   amLODipine (NORVASC) tablet 10 mg, 10 mg, Oral, Daily, Pearlean Brownie, Pramod S, MD, 10 mg at 09/14/23 1033   aspirin EC tablet 81 mg, 81 mg, Oral, Daily, Valentina Lucks N, NP, 81 mg at 09/15/23 0947   calcitRIOL (ROCALTROL) capsule 0.25 mcg, 0.25 mcg, Oral, Q M,W,F, Pearlean Brownie, Pramod S, MD, 0.25 mcg at 09/15/23 0947   Chlorhexidine Gluconate Cloth 2 % PADS 6 each, 6 each, Topical, Q0600, Lynnda Child, MD, 6 each at 09/12/23 0059   Chlorhexidine Gluconate Cloth 2 % PADS 6 each, 6 each, Topical, Q0600, Maxie Barb, MD, 6 each at 09/15/23 0557   [COMPLETED] labetalol (NORMODYNE) injection 20 mg, 20 mg, Intravenous, Once, 20 mg at 09/12/23 0149 **AND** clevidipine (CLEVIPREX) infusion 0.5 mg/mL, 0-21 mg/hr, Intravenous, Continuous, Jacob Blinks, MD, Stopped at 09/12/23 1416   feeding supplement (ENSURE ENLIVE / ENSURE PLUS) liquid 237 mL, 237 mL, Oral, BID BM, Marvel Plan, MD, 237 mL at 09/15/23 1419   folic acid (FOLVITE) tablet 0.5 mg, 0.5 mg, Oral, Daily, Pearlean Brownie, Pramod S, MD, 0.5 mg at 09/15/23 0947   heparin injection 5,000 Units, 5,000 Units, Subcutaneous, Q8H, Marvel Plan, MD, 5,000 Units at 09/15/23 1419  hydrALAZINE (APRESOLINE) injection 20 mg, 20 mg, Intravenous, Q4H PRN, Pamalee Leyden, Devon, NP, 20 mg at 09/13/23 1509   hydrALAZINE (APRESOLINE) tablet 50 mg, 50 mg, Oral, BID, Shafer, Devon, NP, 50 mg at  09/14/23 2033   labetalol (NORMODYNE) injection 20 mg, 20 mg, Intravenous, Q2H PRN, Pamalee Leyden, Devon, NP, 20 mg at 09/13/23 1301   leflunomide (ARAVA) tablet 10 mg, 10 mg, Oral, Daily, Pearlean Brownie, Pramod S, MD, 10 mg at 09/15/23 0950   lidocaine (LIDODERM) 5 % 1 patch, 1 patch, Transdermal, Q24H, Shafer, Devon, NP, 1 patch at 09/15/23 1610   loperamide (IMODIUM) capsule 2 mg, 2 mg, Oral, Q8H, Marvel Plan, MD   Muscle Rub CREA, , Topical, PRN, Jacob Blinks, MD, Given at 09/12/23 0433   ondansetron (ZOFRAN) injection 4 mg, 4 mg, Intravenous, Q6H PRN, Pamalee Leyden, Ludger Nutting, NP, 4 mg at 09/13/23 1635   pantoprazole (PROTONIX) EC tablet 40 mg, 40 mg, Oral, BID, Pearlean Brownie, Pramod S, MD, 40 mg at 09/15/23 0947   rosuvastatin (CRESTOR) tablet 40 mg, 40 mg, Oral, Daily, Jacob Blinks, MD, 40 mg at 09/15/23 0947   sevelamer carbonate (RENVELA) tablet 800 mg, 800 mg, Oral, BID WC, Micki Riley, MD, 800 mg at 09/15/23 9604     Patients Current Diet:  Diet Order                  Diet Heart Room service appropriate? Yes; Fluid consistency: Thin  Diet effective now                         Precautions / Restrictions Precautions Precautions: Fall Precaution Comments: L hemiparesis Restrictions Weight Bearing Restrictions: No    Has the patient had 2 or more falls or a fall with injury in the past year?No   Prior Activity Level Community (5-7x/wk): without AD, drives   Prior Functional Level Prior Function Prior Level of Function : Independent/Modified Independent, Driving Mobility Comments: Community ambulator without AD, drives ADLs Comments: Independent   Self Care: Did the patient need help bathing, dressing, using the toilet or eating?  Independent   Indoor Mobility: Did the patient need assistance with walking from room to room (with or without device)? Independent   Stairs: Did the patient need assistance with internal or external stairs (with or without device)? Independent    Functional Cognition: Did the patient need help planning regular tasks such as shopping or remembering to take medications? Independent   Patient Information Are you of Hispanic, Latino/a,or Spanish origin?: A. No, not of Hispanic, Latino/a, or Spanish origin What is your race?: A. White Do you need or want an interpreter to communicate with a doctor or health care staff?: 0. No   Patient's Response To:  Health Literacy and Transportation Is the patient able to respond to health literacy and transportation needs?: Yes Health Literacy - How often do you need to have someone help you when you read instructions, pamphlets, or other written material from your doctor or pharmacy?: Never In the past 12 months, has lack of transportation kept you from medical appointments or from getting medications?: No In the past 12 months, has lack of transportation kept you from meetings, work, or from getting things needed for daily living?: No   Journalist, newspaper / Equipment Home Equipment: Occupational hygienist (4 wheels), Shower seat - built in, [place] - single point, Agricultural consultant (2 wheels)   Prior Device Use: Indicate devices/aids used by the patient prior to current illness, exacerbation or  injury? None of the above   Current Functional Level Cognition   Arousal/Alertness: Awake/alert (slightly lethargic) Overall Cognitive Status: Within Functional Limits for tasks assessed Orientation Level: Oriented X4 General Comments: decreased attention to L side of body Attention: Sustained Focused Attention: Appears intact Sustained Attention: Appears intact Memory: Appears intact Awareness: Appears intact Problem Solving: Appears intact Executive Function: Organizing Sequencing: Appears intact Organizing: Appears intact Safety/Judgment: Appears intact    Extremity Assessment (includes Sensation/Coordination)   Upper Extremity Assessment: Right hand dominant, LUE deficits/detail LUE Deficits / Details: No  active motion at wrist or digits. elbow flex/ext grossly [facility] but poor control and 3/5 strength. Shoulder 4/5 LUE Sensation: decreased light touch, decreased proprioception (tingling) LUE Coordination: decreased fine motor, decreased gross motor  Lower Extremity Assessment: Defer to PT evaluation LLE Deficits / Details: hip flexion 3+/5, knee extension 4/5, ankle DF 4+/5     ADLs   Overall ADL's : Needs assistance/impaired Grooming: Minimal assistance, Sitting Grooming Details (indicate cue type and reason): for bil tasks Upper Body Bathing: Moderate assistance Lower Body Bathing: Sit to/from stand, Moderate assistance Upper Body Dressing : Moderate assistance, Sitting Lower Body Dressing: Maximal assistance, Sit to/from stand Toilet Transfer: Minimal assistance, Stand-pivot, Rolling walker (2 wheels) Toileting- Clothing Manipulation and Hygiene: Total assistance, Bed level Toileting - Clothing Manipulation Details (indicate cue type and reason): Pt with BM at EOB and seemingly poor awareness. pt with max request not to stand again so performing at bed level Functional mobility during ADLs: Minimal assistance, Rolling walker (2 wheels) (assist for balance and RW management)     Mobility   Overal bed mobility: Needs Assistance Bed Mobility: Supine to Sit Supine to sit: Supervision, HOB elevated, Used rails Sit to supine: Contact guard assist General bed mobility comments: cues for technique for supine to sit using R hand on rail and some assist to lift trunk; to supine assist for positioning     Transfers   Overall transfer level: Needs assistance Equipment used: None, Rolling walker (2 wheels) Transfers: Sit to/from Stand, Bed to chair/wheelchair/BSC Sit to Stand: Contact guard assist Bed to/from chair/wheelchair/BSC transfer type:: Step pivot Step pivot transfers: Min assist General transfer comment: education re: hand placement when transferring STS with RW     Ambulation / Gait /  Stairs / Wheelchair Mobility   Ambulation/Gait Ambulation/Gait assistance: Editor, commissioning (Feet): 35 Feet (+ 40 ft) Assistive device: Straight cane, Rolling walker (2 wheels) Gait Pattern/deviations: Decreased stride length, Decreased dorsiflexion - right, Decreased dorsiflexion - left, Decreased step length - left, Decreased step length - right General Gait Details: Pt ambulates with SPC into hallway & back then with RW. Pt requires min assist overall 2/2 decreased balance, assistance maintaining neutral L wrist positioning when grasping RW with LUE. Pt self limits gait distance 2/2 decreased balance then incontinent BM. Gait velocity: decreased     Posture / Balance Dynamic Sitting Balance Sitting balance - Comments: pt able to don socks sitting EOB with supervision Balance Overall balance assessment: Needs assistance Sitting-balance support: Feet supported, Bilateral upper extremity supported Sitting balance-Leahy Scale: Fair Sitting balance - Comments: pt able to don socks sitting EOB with supervision Standing balance support: During functional activity, No upper extremity supported Standing balance-Leahy Scale: Poor Standing balance comment: Needs support in standing     Special needs/care consideration Dialysis: Hemodialysis Monday, Wednesday, and Friday and Skin intact        Previous Home Environment (from acute therapy documentation) Living Arrangements: Farmer/significant other  Lives  With: Farmer Available Help at Discharge: Family, Available 24 hours/day Type of Home: House Home Layout: One level Home Access: Stairs to enter Entrance Stairs-Rails: None Entrance Stairs-Number of Steps: 2 Bathroom Shower/Tub: Health visitor: Standard Bathroom Accessibility: Yes How Accessible: Accessible via walker   Discharge Living Setting Plans for Discharge Living Setting: Patient's home, Lives with (comment) (Farmer) Type of Home at Discharge:  House Discharge Home Layout: One level Discharge Home Access: Stairs to enter Entrance Stairs-Rails: None Entrance Stairs-Number of Steps: 2 Discharge Bathroom Shower/Tub: Walk-in shower Discharge Bathroom Toilet: Standard Discharge Bathroom Accessibility: Yes How Accessible: Accessible via walker Does the patient have any problems obtaining your medications?: No   Social/Family/Support Systems Anticipated Caregiver: Farmer, Jacob Farmer Anticipated Industrial/product designer Information: [phone] Caregiver Availability: 24/7 Discharge Plan Discussed with Primary Caregiver: Yes Is Caregiver In Agreement with Plan?: Yes Does Caregiver/Family have Issues with Lodging/Transportation while Pt is in Rehab?: No     Goals Patient/Family Goal for Rehab: Mod I PT, OT, independent with SLP Expected length of stay: 12-14 days Pt/Family Agrees to Admission and willing to participate: Yes Program Orientation Provided & Reviewed with Pt/Caregiver Including Roles  & Responsibilities: Yes  Barriers to Discharge: Insurance for SNF coverage     Decrease burden of Care through IP rehab admission: NA     Possible need for SNF placement upon discharge: Not Anticipated     Patient Condition: This patient's medical and functional status has not changed since the consult dated 09/15/23 in which the Rehabilitation Physician determined and documented that the patient was potentially appropriate for intensive rehabilitative care in an inpatient rehabilitation facility. Issues have been addressed and update has been discussed with Dr. Berline Chough and patient now appropriate for inpatient rehabilitation. Will admit to inpatient rehab today.    Preadmission Screen Completed By:  Beryle Beams, 09/15/2023 3:23 PM ______________________________________________________________________   Discussed status with Dr. Berline Chough on 09/16/23 at 12:23 PM and received approval for admission today.   Admission Coordinator:  Beryle Beams, time 12:23 PM/Farmer 09/16/23

## 2023-09-16 NOTE — H&P (Signed)
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  He was given TNK to treat his stroke and he experienced improvement in symptoms.  A follow-up CT scan on 9/28 hemorrhagic transformation of his infarct was noted.  He was monitored in the ICU and transferred out on 9/29.  Follow-up CT head without change in the appearance of the hemorrhagic infarction in the right posterior frontal vertex.  As part of his workup, he underwent 2D echo with ejection fraction of 60 to 65% and grade 1 diastolic dysfunction.  Hemoglobin A1c 5.3%.  He was started on heparin subcutaneously for VTE prophylaxis.  He is now on aspirin 81 mg daily.  Past medical history significant for atrial fibrillation and cardiology was consulted during this admission.  They are considering Watchman device if the patient is able to tolerate short-term anticoagulation.  He will follow-up as an outpatient.  His past medical history is also significant for recent gastrointestinal bleed in May.  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  Platelet use discussed with GI service and okayed to start aspirin 81 mg daily.  The patient is maintained on amlodipine 10 mg daily and hydralazine 25 mg twice daily for hypertension.  The patient undergoes hemodialysis via tunneled dialysis catheter on Mondays Wednesdays and Fridays.  Nephrology will continue to follow.  Medications for rheumatoid arthritis continue.  He is noted loose stools and testing for C. difficile toxin is negative.  Currently receiving Loperamide 2 mg every 8 hours.  Is able to complete supine to sit with supervision and use of hospital bed features yesterday.   He is self-limiting with gait distance is secondary to decreased balance, fatigue and incontinent bowel movement.The patient requires inpatient medicine and rehabilitation evaluations and services for ongoing dysfunction secondary to CVA with hemorrhagic conversion.   Pt reports LBM yesterday but was having diarrhea- required Imodium.  CDIff (-) Back hurting from same position in bed during HD.  Walked yesterday with therapy.  Denies dizziness, nausea or vomiting, but "HD makes me hoarse".  Forms a few drops of urine every 1-2 days, however is dark when saw last.    Review of Systems  Constitutional:  Positive for malaise/fatigue. Negative for chills and fever.  HENT: Negative.    Eyes: Negative.   Respiratory:  Negative for cough and shortness of breath.   Cardiovascular:  Negative for chest pain, palpitations and leg swelling.  Gastrointestinal:  Positive for diarrhea. Negative for constipation and nausea.  Genitourinary:        Almost anuric- a few drops/day  Musculoskeletal:  Positive for back pain. Negative for myalgias.  Skin: Negative.   Neurological:  Positive for focal weakness. Negative for sensory change.  Endo/Heme/Allergies: Negative.   Psychiatric/Behavioral: Negative.    All other systems reviewed and are negative.  Past Medical History:  Diagnosis Date   CAD (coronary artery disease)    Acute myocardial infarction treated with TPA in 1990; 1999-stents to circumflex and RCA; residual total occlusion of the left anterior descending; normal ejection fraction; stress nuclear in 2001-distal anteroseptal ischemia; normal LV function   CKD (chronic kidney disease) stage 4, GFR 15-29 ml/min (HCC)  First degree heart block    History of gastric ulcer    History of kidney stones    History of MI (myocardial infarction)    1990-  treated w/ TPA   Hyperlipidemia    Hypertension    Jaundice    OA (osteoarthritis)    Prediabetes    Prostate cancer (HCC)    Stage T1c ,  Gleason 3+4,  PSA 4.7,  vol 24cc--  scheduled for radiative seed implants   Psoriatic arthritis (HCC)    RA (rheumatoid arthritis) (HCC)    Dr. Beatrix Fetters   Sinus bradycardia    Wears dentures    Past Surgical History:  Procedure Laterality Date   BIOPSY  08/27/2019   Procedure: BIOPSY;  Surgeon: Malissa Hippo, MD;  [facility];  Service: Endoscopy;;  gastric   BIOPSY  04/03/2022   Procedure: BIOPSY;  Surgeon: Dolores Frame, MD;  [facility];  Service: Gastroenterology;;   BIOPSY  04/21/2023   Procedure: BIOPSY;  Surgeon: Lanelle Bal, DO;  [facility];  Service: Endoscopy;;   callus removal Left 09/13/2022   left foot   CARDIOVASCULAR STRESS TEST  2001  per Dr Dietrich Pates clinic note   distal anteroseptal ischemia,  normal LVF   COLONOSCOPY  last one 2006 (approx)   COLONOSCOPY WITH PROPOFOL N/A 04/03/2022   Procedure: COLONOSCOPY WITH PROPOFOL;  Surgeon: Dolores Frame, MD;  [facility];  Service: Gastroenterology;  Laterality: N/A;  945   CORONARY ANGIOPLASTY WITH STENT PLACEMENT  1999   DES x2  to CFX and RCA/  residual total occlusion LAD,  normal LVEF   ENTEROSCOPY N/A 04/24/2023   Procedure: ENTEROSCOPY;  Surgeon: Dolores Frame, MD;  [facility];  Service: Gastroenterology;  Laterality: N/A;   ESOPHAGOGASTRODUODENOSCOPY (EGD) WITH PROPOFOL N/A 08/27/2019   Procedure: ESOPHAGOGASTRODUODENOSCOPY (EGD) WITH PROPOFOL;  Surgeon: Malissa Hippo, MD;  [facility];  Service: Endoscopy;  Laterality: N/A;  10:10   ESOPHAGOGASTRODUODENOSCOPY (EGD) WITH PROPOFOL N/A 04/03/2022   Procedure: ESOPHAGOGASTRODUODENOSCOPY (EGD) WITH PROPOFOL;  Surgeon: Dolores Frame, MD;  [facility];  Service: Gastroenterology;  Laterality: N/A;   ESOPHAGOGASTRODUODENOSCOPY (EGD) WITH PROPOFOL N/A 02/16/2023   Procedure: ESOPHAGOGASTRODUODENOSCOPY (EGD) WITH PROPOFOL;  Surgeon:  Dolores Frame, MD;  [facility];  Service: Gastroenterology;  Laterality: N/A;   ESOPHAGOGASTRODUODENOSCOPY (EGD) WITH PROPOFOL N/A 04/21/2023   Procedure: ESOPHAGOGASTRODUODENOSCOPY (EGD) WITH PROPOFOL;  Surgeon: Lanelle Bal, DO;  [facility];  Service: Endoscopy;  Laterality: N/A;   EYE SURGERY Bilateral    cataract   GIVENS CAPSULE STUDY N/A 04/22/2023   Procedure: GIVENS CAPSULE STUDY;  Surgeon: Dolores Frame, MD;  [facility];  Service: Gastroenterology;  Laterality: N/A;   HEMOSTASIS CLIP PLACEMENT  04/24/2023   Procedure: HEMOSTASIS CLIP PLACEMENT;  Surgeon: Dolores Frame, MD;  [facility];  Service: Gastroenterology;;   HOT HEMOSTASIS  04/03/2022   Procedure: HOT HEMOSTASIS (ARGON PLASMA COAGULATION/BICAP);  Surgeon: Marguerita Merles, Reuel Boom, MD;  [facility];  Service: Gastroenterology;;   HOT HEMOSTASIS  04/24/2023   Procedure: HOT HEMOSTASIS (ARGON PLASMA COAGULATION/BICAP);  Surgeon: Marguerita Merles, Reuel Boom, MD;  [facility];  Service: Gastroenterology;;   IR FLUORO GUIDE CV LINE RIGHT  05/30/2023   IR US GUIDE VASC ACCESS RIGHT  05/30/2023   POLYPECTOMY  04/03/2022   Procedure: POLYPECTOMY;  Surgeon: Dolores Frame, MD;  [facility];  Service: Gastroenterology;;   POSTERIOR CERVICAL FUSION/FORAMINOTOMY N/A 05/26/2017   Procedure: RIGHT C4-5 FORAMINOTOMY WITH EXCISION OF HERNIATED DISC;  Surgeon: Kerrin Champagne, MD;  Location: [facility];  Service: Orthopedics;  Laterality: N/A;   RADIOACTIVE SEED IMPLANT N/A 01/11/2016   Procedure: RADIOACTIVE SEED IMPLANT/BRACHYTHERAPY IMPLANT;  Surgeon: Marcine Matar, MD;  [facility];  Service: Urology;  Laterality: N/A;   73  seeds implanted no seeds founds in bladder   SCLEROTHERAPY  04/24/2023   Procedure: SCLEROTHERAPY;  Surgeon: Marguerita Merles, Reuel Boom, MD;  [facility];   Service: Gastroenterology;;   SUBMUCOSAL TATTOO INJECTION  04/24/2023   Procedure: SUBMUCOSAL TATTOO INJECTION;  Surgeon: Marguerita Merles, Reuel Boom, MD;  [facility];  Service: Gastroenterology;;   Family History  Problem Relation Age of Onset   Heart attack Mother    Coronary artery disease Father    Ulcerative colitis Father    Ulcers Father    Breast cancer Sister    COPD Brother    Pancreatic cancer Brother    Healthy Sister    Diabetes Brother    Cirrhosis Son    Seizures Son    Social History:  reports that he quit smoking about 34 years ago. His smoking use included cigarettes. He started smoking about 64 years ago. He has a 30 pack-year smoking history. He has never been exposed to tobacco smoke. He has never used smokeless tobacco. He reports current alcohol use. He reports that he does not use drugs. Allergies: No Known Allergies Facility-Administered Medications Prior to Admission  Medication Dose Route Frequency Provider Last Rate Last Admin   degarelix (FIRMAGON) injection 240 mg  240 mg Subcutaneous Once Marcine Matar, MD       Medications Prior to Admission  Medication Sig Dispense Refill   allopurinol (ZYLOPRIM) 100 MG tablet TAKE 1/2 TABLET EVERY OTHER DAY 23 tablet 0   amLODipine (NORVASC) 10 MG tablet Take 1 tablet (10 mg total) by mouth daily. 90 tablet 3   calcitRIOL (ROCALTROL) 0.25 MCG capsule Take 0.25 mcg by mouth every Monday, Wednesday, and Friday.     Dupilumab (DUPIXENT) 300 MG/2ML SOPN Inject 300 mg into the skin every 14 (fourteen) days. Starting at day 15 for maintenance. 12 mL 1   folic acid (FOLVITE) 400 MCG tablet Take 400 mcg by mouth daily.     hydrALAZINE (APRESOLINE) 25 MG tablet Take 1 tablet (25 mg total) by mouth 2 (two) times daily. 180 tablet 3   leflunomide (ARAVA) 10 MG tablet TAKE 1 TABLET EVERY DAY 90 tablet 0   Leuprolide Acetate (ELIGARD [place]) Inject 1 Dose into the skin every 4 (four) months.     nitroGLYCERIN  (NITROSTAT) 0.4 MG SL tablet Place 0.4 mg under the tongue every 5 (five) minutes as needed for chest pain.      pantoprazole (PROTONIX) 40 MG tablet TAKE 1 TABLET TWICE DAILY 180 tablet 1   RENVELA 800 MG tablet Take 800 mg by mouth 2 (two) times daily with a meal.     rosuvastatin (CRESTOR) 40 MG tablet Take 1 tablet (40 mg total) by mouth daily. 90 tablet 3   triamcinolone cream (KENALOG) 0.1 % Apply 1 Application topically daily. (Patient taking differently: Apply 1 Application topically daily as needed (irritation).)        Home: Home Living Family/patient expects to be discharged to:: Private residence Living Arrangements: Spouse/significant other Available Help at Discharge: Family, Available 24 hours/day Type  of Home: House Home Access: Stairs to enter [employer] of Steps: 2 Entrance Stairs-Rails: None Home Layout: One level Bathroom Shower/Tub: Health visitor: Pharmacist, community: Yes Home Equipment: Occupational hygienist (4 wheels), Shower seat - built in, [place] - single point, Agricultural consultant (2 wheels)  Lives With: Spouse   Functional History: Prior Function Prior Level of Function : Independent/Modified Independent, Driving Mobility Comments: Community ambulator without AD, drives ADLs Comments: Independent  Functional Status:  Mobility: Bed Mobility Overal bed mobility: Needs Assistance Bed Mobility: Supine to Sit Supine to sit: Supervision, HOB elevated, Used rails Sit to supine: Contact guard assist General bed mobility comments: cues for technique for supine to sit using R hand on rail and some assist to lift trunk; to supine assist for positioning Transfers Overall transfer level: Needs assistance Equipment used: None, Rolling walker (2 wheels) Transfers: Sit to/from Stand, Bed to chair/wheelchair/BSC Sit to Stand: Contact guard assist Bed to/from chair/wheelchair/BSC transfer type:: Step pivot Step pivot transfers: Min  assist General transfer comment: education re: hand placement when transferring STS with RW Ambulation/Gait Ambulation/Gait assistance: Min assist Gait Distance (Feet): 35 Feet (+ 40 ft) Assistive device: Straight cane, Rolling walker (2 wheels) Gait Pattern/deviations: Decreased stride length, Decreased dorsiflexion - right, Decreased dorsiflexion - left, Decreased step length - left, Decreased step length - right General Gait Details: Pt ambulates with SPC into hallway & back then with RW. Pt requires min assist overall 2/2 decreased balance, assistance maintaining neutral L wrist positioning when grasping RW with LUE. Pt self limits gait distance 2/2 decreased balance then incontinent BM. Gait velocity: decreased    ADL: ADL Overall ADL's : Needs assistance/impaired Grooming: Minimal assistance, Sitting Grooming Details (indicate cue type and reason): for bil tasks Upper Body Bathing: Moderate assistance Lower Body Bathing: Sit to/from stand, Moderate assistance Upper Body Dressing : Moderate assistance, Sitting Lower Body Dressing: Maximal assistance, Sit to/from stand Toilet Transfer: Minimal assistance, Stand-pivot, Rolling walker (2 wheels) Toileting- Clothing Manipulation and Hygiene: Total assistance, Bed level Toileting - Clothing Manipulation Details (indicate cue type and reason): Pt with BM at EOB and seemingly poor awareness. pt with max request not to stand again so performing at bed level Functional mobility during ADLs: Minimal assistance, Rolling walker (2 wheels) (assist for balance and RW management)  Cognition: Cognition Overall Cognitive Status: Within Functional Limits for tasks assessed Arousal/Alertness: Awake/alert (slightly lethargic) Orientation Level: Oriented X4 Year: 2024 Day of Week: Correct Attention: Sustained Focused Attention: Appears intact Sustained Attention: Appears intact Memory: Appears intact Awareness: Appears intact Problem Solving:  Appears intact Executive Function: Organizing Sequencing: Appears intact Organizing: Appears intact Safety/Judgment: Appears intact Cognition Arousal: Alert Behavior During Therapy: Flat affect Overall Cognitive Status: Within Functional Limits for tasks assessed General Comments: decreased attention to L side of body  Physical Exam: Blood pressure 127/76, pulse 68, temperature 97.8 F (36.6 C), resp. rate 18, height 5\' 10"  (1.778 m), weight (S) 66.9 kg, SpO2 100%. Physical Exam Vitals and nursing note reviewed. Exam conducted with a chaperone present.  Constitutional:      Appearance: Normal appearance. He is normal weight.     Comments: Almost supine in HD bed- awake, alert, appropriate, NAD Hooked up to HD-   HENT:     Head: Normocephalic and atraumatic.     Comments: L facial droop Tongue midline Facial sensation intact    Right Ear: External ear normal.     Left Ear: External ear normal.     Nose: Nose normal. No  congestion.     Mouth/Throat:     Mouth: Mucous membranes are moist.     Pharynx: Oropharynx is clear. No oropharyngeal exudate.  Eyes:     General:        Right eye: No discharge.        Left eye: No discharge.     Extraocular Movements: Extraocular movements intact.  Cardiovascular:     Rate and Rhythm: Normal rate. Rhythm irregular.     Heart sounds: Normal heart sounds. No murmur heard. Pulmonary:     Effort: Pulmonary effort is normal. No respiratory distress.     Breath sounds: Normal breath sounds. No wheezing, rhonchi or rales.  Abdominal:     General: Abdomen is flat.     Palpations: Abdomen is soft.     Comments: Slightly hyperactive BS  Musculoskeletal:     Cervical back: Neck supple. No tenderness.     Comments: R side 5/5 in biceps, triceps, WE< grip and FA as well as HF, KE, KF, DF and PF LUE and LLE- 5-/5 throughout in same muscles tested  Skin:    General: Skin is warm and dry.     Comments: No LE edema B/L Ecchmoses B/L Ue's R chest  HD catheter- looks OK L AC fossa IV- looks OK Getting HD  Neurological:     Mental Status: He is alert.     Comments: Intact to light touch in all 4 extremities No spasticity seen No clonus Ox3 Intact naming 3/3 and repetition   Psychiatric:     Comments: Slightly flat     Results for orders placed or performed during the hospital encounter of 09/11/23 (from the past 48 hour(s))  CBC     Status: Abnormal   Collection Time: 09/15/23  7:38 AM  Result Value Ref Range   WBC 13.6 (H) 4.0 - 10.5 K/uL   RBC 3.28 (L) 4.22 - 5.81 MIL/uL   Hemoglobin 9.7 (L) 13.0 - 17.0 g/dL   HCT 14.7 (L) 82.9 - 56.2 %   MCV 90.2 80.0 - 100.0 fL   MCH 29.6 26.0 - 34.0 pg   MCHC 32.8 30.0 - 36.0 g/dL   RDW 13.0 86.5 - 78.4 %   Platelets 175 150 - 400 K/uL   nRBC 0.0 0.0 - 0.2 %    Comment: Performed at [facility], [address] [place], [place] [address]  Basic metabolic panel     Status: Abnormal   Collection Time: 09/15/23  7:38 AM  Result Value Ref Range   Sodium 134 (L) 135 - 145 mmol/L   Potassium 3.9 3.5 - 5.1 mmol/L   Chloride 94 (L) 98 - 111 mmol/L   CO2 23 22 - 32 mmol/L   Glucose, Bld 107 (H) 70 - 99 mg/dL    Comment: Glucose reference range applies only to samples taken after fasting for at least 8 hours.   BUN 66 (H) 8 - 23 mg/dL   Creatinine, Ser 5.28 (H) 0.61 - 1.24 mg/dL   Calcium 8.6 (L) 8.9 - 10.3 mg/dL   GFR, Estimated 6 (L) >60 mL/min    Comment: (NOTE) Calculated using the CKD-EPI Creatinine Equation (2021)    Anion gap 17 (H) 5 - 15    Comment: Performed at [facility], [address], [place], [place] [address]  CBC     Status: Abnormal   Collection Time: 09/16/23  6:14 AM  Result Value Ref Range   WBC 9.7 4.0 -  10.5 K/uL   RBC 3.36 (L) 4.22 - 5.81 MIL/uL   Hemoglobin 9.8 (L) 13.0 - 17.0 g/dL   HCT 78.2 (L) 95.6 - 21.3 %   MCV 87.8 80.0 - 100.0 fL   MCH 29.2 26.0 - 34.0 pg   MCHC 33.2 30.0 - 36.0 g/dL   RDW 08.6 57.8 - 46.9 %   Platelets 181 150  - 400 K/uL   nRBC 0.0 0.0 - 0.2 %    Comment: Performed at [facility], [address], [place], [place] [address]  Basic metabolic panel     Status: Abnormal   Collection Time: 09/16/23  6:14 AM  Result Value Ref Range   Sodium 132 (L) 135 - 145 mmol/L   Potassium 3.9 3.5 - 5.1 mmol/L   Chloride 93 (L) 98 - 111 mmol/L   CO2 23 22 - 32 mmol/L   Glucose, Bld 111 (H) 70 - 99 mg/dL    Comment: Glucose reference range applies only to samples taken after fasting for at least 8 hours.   BUN 79 (H) 8 - 23 mg/dL   Creatinine, Ser 84.13 (H) 0.61 - 1.24 mg/dL   Calcium 8.5 (L) 8.9 - 10.3 mg/dL   GFR, Estimated 5 (L) >60 mL/min    Comment: (NOTE) Calculated using the CKD-EPI Creatinine Equation (2021)    Anion gap 16 (H) 5 - 15    Comment: Performed at [facility], [address], [place], [place] [address]   No results found.    Blood pressure 127/76, pulse 68, temperature 97.8 F (36.6 C), resp. rate 18, height 5\' 10"  (1.778 m), weight (S) 66.9 kg, SpO2 100%.  Medical Problem List and Plan: 1. Functional deficits secondary to parietal lobe infarct on Right with ICH s/p TNK  -patient may not shower due to HD catheter  -ELOS/Goals: 12-14 days mod I  Admit to CIR  2.  Antithrombotics: -DVT/anticoagulation:  Pharmaceutical: Heparin  -antiplatelet therapy: aspirin 81 mg  3. Pain Management: Tylenol as needed  4. Mood/Behavior/Sleep: LCSW to evaluate and provide emotional support  -antipsychotic agents: n/a  5. Neuropsych/cognition: This patient appears capable of making decisions on his own behalf.  6. Skin/Wound Care: Routine skin care checks   7. Fluids/Electrolytes/Nutrition: Routine Is and Os and follow-up chemistries.  Routine labs as per nephrology.  -continue Calcitrol on Monday Wednesday and Fridays  -continue Folvite and Renvela  8: Hypertension: monitor TID and prn  -continue amlodipine 10 mg daily  -continue hydralazine 50 mg BID  9: Hyperlipidemia:  continue statin  10: History of gout: Continue allopurinol 100 mg daily  11: Diarrhea: C. difficile is negative.  Continue loperamide 2 mg every 8 hours.  Consider probiotics, fiber supplement.  12: History of jejunal ulcer/AVM: Continue Protonix 40 mg twice daily  13: History of atrial fibrillation: follow-up with cardiology for possible Watchman procedure  13: History of rheumatoid arthritis: Continue leflunomide 10 mg daily  14. ESRD- HD per renal       Milinda Antis, PA-C 09/16/2023   I have personally performed a face to face diagnostic evaluation of this patient and formulated the key components of the plan.  Additionally, I have personally reviewed laboratory data, imaging studies, as well as relevant notes and concur with the physician assistant's documentation above.   The patient's status has not changed from the original H&P.  Any changes in documentation from the acute care chart have been noted above.

## 2023-09-16 NOTE — Progress Notes (Signed)
MD response: Dialysis orders placed.

## 2023-09-17 DIAGNOSIS — I6389 Other cerebral infarction: Secondary | ICD-10-CM | POA: Diagnosis not present

## 2023-09-17 LAB — CBC WITH DIFFERENTIAL/PLATELET
Abs Immature Granulocytes: 0.14 10*3/uL — ABNORMAL HIGH (ref 0.00–0.07)
Basophils Absolute: 0 10*3/uL (ref 0.0–0.1)
Basophils Relative: 1 %
Eosinophils Absolute: 0.5 10*3/uL (ref 0.0–0.5)
Eosinophils Relative: 6 %
HCT: 29.9 % — ABNORMAL LOW (ref 39.0–52.0)
Hemoglobin: 10 g/dL — ABNORMAL LOW (ref 13.0–17.0)
Immature Granulocytes: 2 %
Lymphocytes Relative: 11 %
Lymphs Abs: 1 10*3/uL (ref 0.7–4.0)
MCH: 30.6 pg (ref 26.0–34.0)
MCHC: 33.4 g/dL (ref 30.0–36.0)
MCV: 91.4 fL (ref 80.0–100.0)
Monocytes Absolute: 1.4 10*3/uL — ABNORMAL HIGH (ref 0.1–1.0)
Monocytes Relative: 16 %
Neutro Abs: 5.6 10*3/uL (ref 1.7–7.7)
Neutrophils Relative %: 64 %
Platelets: 197 10*3/uL (ref 150–400)
RBC: 3.27 MIL/uL — ABNORMAL LOW (ref 4.22–5.81)
RDW: 14.6 % (ref 11.5–15.5)
WBC: 8.6 10*3/uL (ref 4.0–10.5)
nRBC: 0 % (ref 0.0–0.2)

## 2023-09-17 LAB — BASIC METABOLIC PANEL
Anion gap: 10 (ref 5–15)
BUN: 44 mg/dL — ABNORMAL HIGH (ref 8–23)
CO2: 27 mmol/L (ref 22–32)
Calcium: 8.5 mg/dL — ABNORMAL LOW (ref 8.9–10.3)
Chloride: 95 mmol/L — ABNORMAL LOW (ref 98–111)
Creatinine, Ser: 7.02 mg/dL — ABNORMAL HIGH (ref 0.61–1.24)
GFR, Estimated: 7 mL/min — ABNORMAL LOW (ref >60)
Glucose, Bld: 140 mg/dL — ABNORMAL HIGH (ref 70–99)
Potassium: 3.8 mmol/L (ref 3.5–5.1)
Sodium: 132 mmol/L — ABNORMAL LOW (ref 135–145)

## 2023-09-17 MED ORDER — CHLORHEXIDINE GLUCONATE CLOTH 2 % EX PADS
6.0000 | MEDICATED_PAD | Freq: Two times a day (BID) | CUTANEOUS | Status: DC
  Administered 2023-09-17 – 2023-09-25 (×13): 6 via TOPICAL

## 2023-09-17 MED ORDER — CHLORHEXIDINE GLUCONATE CLOTH 2 % EX PADS
6.0000 | MEDICATED_PAD | Freq: Every day | CUTANEOUS | Status: DC
  Administered 2023-09-17: 6 via TOPICAL

## 2023-09-17 MED ORDER — LOPERAMIDE HCL 2 MG PO CAPS: 2.0000 mg | ORAL_CAPSULE | ORAL | Status: DC | PRN

## 2023-09-17 MED ORDER — HEPARIN SODIUM (PORCINE) 1000 UNIT/ML IJ SOLN
INTRAMUSCULAR | Status: AC
  Administered 2023-09-17: 1000 [IU]
  Filled 2023-09-17: qty 4

## 2023-09-17 NOTE — Progress Notes (Signed)
   09/17/23 1836  Vitals  Temp 98.8 F (37.1 C)  Pulse Rate 79  Resp (!) 22  BP (!) 168/78  SpO2 100 %  O2 Device Room Air  Oxygen Therapy  Patient Activity (if Appropriate) In bed  Pulse Oximetry Type Continuous  Post Treatment  Dialyzer Clearance Clear  Hemodialysis Intake (mL) 0 mL  Liters Processed 69.2  Fluid Removed (mL) 0 mL  Tolerated HD Treatment Yes  Post-Hemodialysis Comments Pt. tolerated procedure without difficulties. UF goal met and VSS and report call to 4W Bedside RN   Received patient in bed to unit.  Alert and oriented.  Informed consent signed and in chart.   TX duration:3.25  Patient tolerated well.  Transported back to the room  Alert, without acute distress.  Hand-off given to patient's nurse.   Access used: Yes Access issues: No  Total UF removed: 0 Medication(s) given: See MAR Post HD VS: See Above Grid Post HD weight: 65.0 kg   [facility]

## 2023-09-17 NOTE — Progress Notes (Signed)
Physical Therapy Session Note  Patient Details  Name: Jacob Farmer MRN: [id number] Date of Birth: [birth year]  Today's Date: 09/17/2023 PT Individual Time: 1100-1200 PT Individual Time Calculation (min): 60 min  Today's Date: 09/17/2023 PT Missed Time: 15 Minutes Missed Time Reason: Other (Comment) (B knee pain from arthritis and pt discussing medical presentation with nephrology PA.)  Short Term Goals: Week 1:     Skilled Therapeutic Interventions/Progress Updates: Patient supine in bed with wife present on entrance to room. Patient alert and agreeable to PT session.   PTA discussed with attending PT about evaluation prior to session that included pt's plan of care and current presentation.   Patient reported no pain, only some slight fatigue from it being the first day of therapy services. Pt BP monitored at beginning of session in supine, short sitting EOB, and standing as recorded under "vitals." Pt reported slight dizziness when transitioning from supine to sitting (supervision with pt utilizing bed railing for assistance). After getting up to standing to RW with CGA (VC for hand placement/sequence), pt did not report any symptoms until several seconds passed with pt then saying he needed to sit back down due to steady progression of dizziness. Pt back to supine with min/modA for safety (pt becoming pallor at this point - PTA elevated B LE's). Pt reported decrease in symptoms and BP monitored one last time with pt in supine. PTA discussed with RN about findings (patient to stay supine in bed - pt also had dialysis shortly after lunch). PTA also alerted attending PA in nephrology that arrived in the middle of the session to converse with pt. Pt agreed to participate in bed level activities. Pt performed SLR with L LE's 2 x 10 with decrease in ROM with L LE, and 1 x 10 with R LE (all with 5lb ankle weight). Pt then performed SLR with L LE without weight. Pt reported not being able to increase  ROM. PTA provided false AAROM (touching pants, but not providing assistance) to show that pt could increase ROM, and that a lot of interventions will be focused on building neuromuscular connection back to L side. Session concluded early due to fatigue and pt reporting's of pain in B knees from arthritis.   Patient supine in bed with wife present at end of session with brakes locked, bed alarm set, and all needs within reach.      Therapy Documentation Precautions:  Precautions Precautions: Fall Precaution Comments: L hemiparesis Restrictions Weight Bearing Restrictions: No  Vitals: 139/73 (93) - supine  99/60 (72) - short sitting 1st 119/76 (89) - short sitting 2nd after 3-4 minutes  75/53 (60) - shortly after sitting and pt getting back to supine with reported symptoms and pallor skin in face 136/69 (87) - shortly after supine and B LE's elevated  Therapy/Group: Individual Therapy  Robyne Matar PTA 09/17/2023, 3:17 PM

## 2023-09-17 NOTE — Progress Notes (Signed)
Pt chart unavailable due to HD. SW will input assessment 10/3.

## 2023-09-17 NOTE — Progress Notes (Signed)
Inpatient Rehabilitation  Patient information reviewed and entered into eRehab system by Demarrio Menges Tomeka Kantner, OTR/L, Rehab Quality Coordinator.   Information including medical coding, functional ability and quality indicators will be reviewed and updated through discharge.   

## 2023-09-17 NOTE — Progress Notes (Signed)
[facility] Progress Note   Subjective:   Patient seen and examined at bedside during PT.  Orthostatic hypotension noted with symptoms when standing.  Feeling better now that laying back down.  No issues with HD yesterday.  Denies CP, SOB, abdominal pain and n/v/d.   Objective Vitals:   09/16/23 1554 09/16/23 1958 09/16/23 2033 09/17/23 0411  BP: (!) 140/71 134/76 (!) 146/70 (!) 141/81  Pulse: 75 75 74 77  Resp: 15 16 18 17   Temp: 98.3 F (36.8 C) 98.6 F (37 C) 98.2 F (36.8 C) 98.2 F (36.8 C)  SpO2: 100% 99% 99% 98%  Weight:      Height:       Physical Exam General:well appearing male in NAD Heart:RRR, no MRG Lungs:CTAB, nml WOB Abdomen:soft, NTND  Extremities:no LE edema Dialysis [facility]: [facility]   Filed Weights   09/16/23 1537  Weight: 65.2 kg   No intake or output data in the 24 hours ending 09/17/23 1347  Additional Objective Labs: Basic Metabolic Panel: Recent Labs  Lab 09/14/23 0407 09/15/23 0738 09/16/23 0614  NA 133* 134* 132*  K 3.4* 3.9 3.9  CL 95* 94* 93*  CO2 25 23 23   GLUCOSE 131* 107* 111*  BUN 42* 66* 79*  CREATININE 5.58* 8.33* 10.23*  CALCIUM 9.1 8.6* 8.5*  PHOS 3.9  --   --    Liver Function Tests: Recent Labs  Lab 09/11/23 2133 09/14/23 0407  AST 20 86*  ALT 16 25  ALKPHOS 98 77  BILITOT 0.5 0.8  PROT 6.9 6.3*  ALBUMIN 3.6 3.1*   CBC: Recent Labs  Lab 09/11/23 2133 09/11/23 2136 09/15/23 0738 09/16/23 0614  WBC 6.1  --  13.6* 9.7  NEUTROABS 4.1  --   --   --   HGB 10.9* 11.2* 9.7* 9.8*  HCT 33.4* 33.0* 29.6* 29.5*  MCV 89.5  --  90.2 87.8  PLT 162  --  175 181   CBG: Recent Labs  Lab 09/11/23 2132 09/12/23 0105  GLUCAP 142* 116*    No results found.  Medications:   allopurinol  100 mg Oral Daily   amLODipine  10 mg Oral Daily   aspirin EC  81 mg Oral Daily   calcitRIOL  0.25 mcg Oral Q M,W,F   Chlorhexidine Gluconate Cloth  6 each Topical Q0600   Chlorhexidine Gluconate Cloth  6 each  Topical Q12H   feeding supplement  237 mL Oral BID BM   folic acid  0.5 mg Oral Daily   heparin injection (subcutaneous)  5,000 Units Subcutaneous Q8H   hydrALAZINE  50 mg Oral BID   leflunomide  10 mg Oral Daily   lidocaine  1 patch Transdermal Q24H   pantoprazole  40 mg Oral BID   rosuvastatin  40 mg Oral Daily   sevelamer carbonate  800 mg Oral BID WC    Dialysis Orders: [facility] in [place]. RIJ TDC for the access.  MWF 4hrs 400/800 2K 2.5 Ca EDW 70.8 Venofer 50mg  q2wks Mircera q2wks Calcitriol 0.49mcg qHD No Heparin   Assessment/Plan: 1. 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 Cardioembolic in setting of A fib most likely per neuro. Off anticoagulation. Going to CIR. 2. ESRD -on HD MWF.  HD today per regular schedule. NO HEPARIN.  3. Anemia of CKD- Hgb 9.8. Follow trends, may need to start ESA.   4. Secondary hyperparathyroidism - Ca and phos at goal. Continue VDRA and  renvela when eating. 5. HTN/volume - BP ok. Orthostatic during PT today. Home antihypertensive's restarted.  Will d/c hydralazine. Continue Amlodipine 10mg  every day. Avoid hypotension. Does not appear volume overloaded,  6. Nutrition - Renal diet w/fluid restrictions  Virgina [place], PA-C [facility] 09/17/2023,1:47 PM  LOS: 1 day

## 2023-09-17 NOTE — Progress Notes (Signed)
   09/17/23 1836  Vitals  Temp 98.8 F (37.1 C)  Pulse Rate 79  Resp (!) 22  BP (!) 168/78  SpO2 100 %  O2 Device Room Air  Oxygen Therapy  Patient Activity (if Appropriate) In bed  Pulse Oximetry Type Continuous  Post Treatment  Dialyzer Clearance Clear  Hemodialysis Intake (mL) 0 mL  Liters Processed 69.2  Fluid Removed (mL) 0 mL  Tolerated HD Treatment Yes  Post-Hemodialysis Comments Pt. tolerated procedure without difficulties. UF goal met and VSS and report call to 4W Bedside RN

## 2023-09-17 NOTE — Discharge Summary (Signed)
Physician Discharge Summary  Patient ID: Jacob Farmer MRN: [id number] DOB/AGE: 03/29/1946 77 y.o.  Admit date: 09/16/2023 Discharge date: 09/25/2023  Discharge Diagnoses:  Principal Problem:   Parietal lobe infarction [facility]) Active problems: Functional deficits secondary to parietal lobe infarct on the right with ICH status post TNK Hypertension Hyperlipidemia Gout Diarrhea PUD Atrial fibrillation Rheumatoid arthritis ESRD Orthostatic hypotension  Discharged Condition: {condition:18240}  Significant Diagnostic Studies: CT H Labs:  Basic Metabolic Panel: Recent Labs  Lab 09/11/23 2133 09/11/23 2136 09/14/23 0407 09/15/23 0738 09/16/23 0614  NA 131* 132* 133* 134* 132*  K 3.0* 3.1* 3.4* 3.9 3.9  CL 92* 94* 95* 94* 93*  CO2 29  --  25 23 23   GLUCOSE 161* 156* 131* 107* 111*  BUN 28* 26* 42* 66* 79*  CREATININE 4.15* 4.70* 5.58* 8.33* 10.23*  CALCIUM 8.6*  --  9.1 8.6* 8.5*  MG  --   --  2.2  --   --   PHOS  --   --  3.9  --   --     CBC: Recent Labs  Lab 09/11/23 2133 09/11/23 2136 09/15/23 0738 09/16/23 0614  WBC 6.1  --  13.6* 9.7  NEUTROABS 4.1  --   --   --   HGB 10.9* 11.2* 9.7* 9.8*  HCT 33.4* 33.0* 29.6* 29.5*  MCV 89.5  --  90.2 87.8  PLT 162  --  175 181    CBG: Recent Labs  Lab 09/11/23 2132 09/12/23 0105  GLUCAP 142* 116*    Brief HPI:   [name] is a 77 y.o. male who presented to the ED on 09/11/2023 complaining of left sided weakness, left arm numbness and left facial droop. He was given TNK to treat his stroke and he experienced improvement in symptoms. A follow-up CT scan on 9/28 hemorrhagic transformation of his infarct was noted. He was monitored in the ICU and transferred out on 9/29. Follow-up CT head without change in the appearance of the hemorrhagic infarction in the right posterior frontal vertex. As part of his workup, he underwent 2D echo with ejection fraction of 60 to 65% and grade 1 diastolic dysfunction. Hemoglobin  A1c 5.3%. He was started on heparin subcutaneously for VTE prophylaxis. He is now on aspirin 81 mg daily. Past medical history significant for atrial fibrillation and cardiology was consulted during this admission. They are considering Watchman device if the patient is able to tolerate short-term anticoagulation. He will follow-up as an outpatient. His past medical history is also significant for recent gastrointestinal bleed in May. 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 Platelet use discussed with GI service and okayed to start aspirin 81 mg daily. The patient is maintained on amlodipine 10 mg daily and hydralazine 25 mg twice daily for hypertension. The patient undergoes hemodialysis via tunneled dialysis catheter on Mondays Wednesdays and Fridays. Nephrology will continue to follow. Medications for rheumatoid arthritis continue. He is noted loose stools and testing for C. difficile toxin is negative. Currently receiving Loperamide 2 mg every 8 hours. Is able to complete supine to sit with supervision and use of hospital bed features yesterday. He is self-limiting with gait distance is secondary to decreased balance, fatigue and incontinent bowel movement.    Hospital Course: RANDALPH DUFRENE was admitted to rehab 09/16/2023 for inpatient therapies to consist of PT, ST and OT at  least three hours five days a week. Past admission physiatrist, therapy team and rehab RN have worked together to provide customized collaborative inpatient rehab. Felt orthostatic with PT after HD on Wednesday. Thigh TED hose applied. 10/03: BP supine: 147/74, BP sitting: 106/64  BP standing: 81/54. Abdominal binder added.    Blood pressures were monitored on TID basis and amlodipine 10 mg daily and hydralazine 50 mg daily continued. Discontinued hydralazine 10/02 due to hypotension. Norvasc adjusted to 5 mg daily  10/04.     Rehab course: During patient's stay in rehab weekly team conferences were held to monitor patient's progress, set goals and discuss barriers to discharge. At admission, patient required min with basic self-care skills and min assist with mobility.   He has had improvement in activity tolerance, balance, postural control as well as ability to compensate for deficits. He has had improvement in functional use RUE/LUE  and RLE/LLE as well as improvement in awareness       Disposition:  There are no questions and answers to display.         Diet: Renal  Special Instructions: No driving, alcohol consumption or tobacco use.  Discharge Instructions     Ambulatory referral to Neurology   Complete by: As directed    Follow up with Dr. Pearlean Brownie at [facility] in 4-6 weeks. Too complicated for RN to follow. Thanks.      Allergies as of 09/17/2023   No Known Allergies   Med Rec must be completed prior to using this [facility]***       Follow-up Information     Micki Riley, MD. Schedule an appointment as soon as possible for a visit in 1 month(s).   Specialties: Neurology, Radiology Why: stroke clinic Contact information: [address] [place] [address] [phone]                 Signed: Milinda Antis 09/17/2023, 2:21 PM

## 2023-09-17 NOTE — Progress Notes (Signed)
Inpatient Rehabilitation Center Individual Statement of Services  Patient Name:  Jacob Farmer  Date:  09/17/2023  Welcome to the Inpatient Rehabilitation Center.  Our goal is to provide you with an individualized program based on your diagnosis and situation, designed to meet your specific needs.  With this comprehensive rehabilitation program, you will be expected to participate in at least 3 hours of rehabilitation therapies Monday-Friday, with modified therapy programming on the weekends.  Your rehabilitation program will include the following services:  Physical Therapy (PT), Occupational Therapy (OT), Speech Therapy (ST), 24 hour per day rehabilitation nursing, Therapeutic Recreaction (TR), Neuropsychology, Care Coordinator, Rehabilitation Medicine, Nutrition Services, Pharmacy Services, and Other  Weekly team conferences will be held on Wednesdays to discuss your progress.  Your Inpatient Rehabilitation Care Coordinator will talk with you frequently to get your input and to update you on team discussions.  Team conferences with you and your family in attendance may also be held.  Expected length of stay:  12-14 Days  Overall anticipated outcome: Mod I PT, OT, independent with SLP   Depending on your progress and recovery, your program may change. Your Inpatient Rehabilitation Care Coordinator will coordinate services and will keep you informed of any changes. Your Inpatient Rehabilitation Care Coordinator's name and contact numbers are listed  below.  The following services may also be recommended but are not provided by the Inpatient Rehabilitation Center:   Home Health Rehabiltiation Services Outpatient Rehabilitation Services    Arrangements will be made to provide these services after discharge if needed.  Arrangements include referral to agencies that provide these services.  Your insurance has been verified to be:  [employer] Your primary doctor is:  Trena Platt,  MD  Pertinent information will be shared with your doctor and your insurance company.  Inpatient Rehabilitation Care Coordinator:  Lavera Guise, [place] [phone] or [phone]  Information discussed with and copy given to patient by: Andria Rhein, 09/17/2023, 9:09 AM

## 2023-09-17 NOTE — Progress Notes (Signed)
PROGRESS NOTE   Subjective/Complaints:  No issues overnite, dizzy with PT this am SBP 100 in PT was 140 prior to therapy  ROS- neg CP, SOB, N/V/D Objective:   No results found. Recent Labs    09/15/23 0738 09/16/23 0614  WBC 13.6* 9.7  HGB 9.7* 9.8*  HCT 29.6* 29.5*  PLT 175 181   Recent Labs    09/15/23 0738 09/16/23 0614  NA 134* 132*  K 3.9 3.9  CL 94* 93*  CO2 23 23  GLUCOSE 107* 111*  BUN 66* 79*  CREATININE 8.33* 10.23*  CALCIUM 8.6* 8.5*   No intake or output data in the 24 hours ending 09/17/23 0848      Physical Exam: Vital Signs Blood pressure (!) 141/81, pulse 77, temperature 98.2 F (36.8 C), resp. rate 17, height 5\' 10"  (1.778 m), weight 65.2 kg, SpO2 98%.   General: No acute distress Mood and affect are appropriate Heart: Regular rate and rhythm no rubs murmurs or extra sounds Lungs: Clear to auscultation, breathing unlabored, no rales or wheezes Abdomen: Positive bowel sounds, soft nontender to palpation, nondistended Extremities: No clubbing, cyanosis, or edema Skin: No evidence of breakdown, no evidence of rash Neurologic: Cranial nerves II through XII intact, motor strength is 5/5 in RIght and 4+ Left  deltoid, bicep, tricep, grip, hip flexor, knee extensors, ankle dorsiflexor and plantar flexor  Cerebellar exam normal finger to nose to finger as well as heel to shin in RIght and mild Left  upper and lower extremities Musculoskeletal: Full range of motion in all 4 extremities. No joint swelling   Assessment/Plan: 1. Functional deficits which require 3+ hours per day of interdisciplinary therapy in a comprehensive inpatient rehab setting. Physiatrist is providing close team supervision and 24 hour management of active medical problems listed below. Physiatrist and rehab team continue to assess barriers to discharge/monitor patient progress toward functional and medical goals  Care  Tool:  Bathing              Bathing assist       Upper Body Dressing/Undressing Upper body dressing        Upper body assist      Lower Body Dressing/Undressing Lower body dressing            Lower body assist       Toileting Toileting    Toileting assist       Transfers Chair/bed transfer  Transfers assist           Locomotion Ambulation   Ambulation assist              Walk 10 feet activity   Assist           Walk 50 feet activity   Assist           Walk 150 feet activity   Assist           Walk 10 feet on uneven surface  activity   Assist           Wheelchair     Assist               Wheelchair 50 feet with  2 turns activity    Assist            Wheelchair 150 feet activity     Assist          Blood pressure (!) 141/81, pulse 77, temperature 98.2 F (36.8 C), resp. rate 17, height 5\' 10"  (1.778 m), weight 65.2 kg, SpO2 98%.  Medical Problem List and Plan: 1. 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  Antithrombotics: -DVT/anticoagulation:  Pharmaceutical: Heparin             -antiplatelet therapy: aspirin 81 mg   3. Pain Management: Tylenol as needed   4. Mood/Behavior/Sleep: LCSW to evaluate and provide emotional support             -antipsychotic agents: n/a   5. Neuropsych/cognition: This patient appears capable of making decisions on his own behalf.   6. Skin/Wound Care: Routine skin care checks   7. Fluids/Electrolytes/Nutrition: Routine Is and Os and follow-up chemistries.  Routine labs as per nephrology.             -continue Calcitrol on Monday Wednesday and Fridays             -continue Folvite and Renvela   8: Hypertension: monitor TID and prn             -continue amlodipine 10 mg daily              -continue hydralazine 50 mg BID  Check ortho vitals for hypotension - worse day after HD, will use thigh hi TED hose  Vitals:   09/16/23 2033 09/17/23 0411  BP: (!) 146/70 (!) 141/81  Pulse: 74 77  Resp: 18 17  Temp: 98.2 F (36.8 C) 98.2 F (36.8 C)  SpO2: 99% 98%    9: Hyperlipidemia: continue statin   10: History of gout: Continue allopurinol 100 mg daily   11: Diarrhea: C. difficile is negative.  Continue loperamide 2 mg every 8 hours.  Consider probiotics, fiber supplement.   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 ESRD- HD per renal        LOS: 1 days A FACE TO FACE EVALUATION WAS PERFORMED  Erick Colace 09/17/2023, 8:48 AM

## 2023-09-17 NOTE — Evaluation (Signed)
Occupational Therapy Assessment and Plan  Patient Details  Name: Jacob Farmer MRN: [id number] Date of Birth: 1946-09-03  OT Diagnosis: hemiplegia affecting non-dominant side and muscle weakness (generalized) Rehab Potential: Rehab Potential (ACUTE ONLY): Good ELOS: 7-9 days   Today's Date: 09/17/2023 OT Individual Time: 0900-1000 OT Individual Time Calculation (min): 60 min     Hospital Problem: Principal Problem:   Parietal lobe infarction [facility])   Past Medical History:  Past Medical History:  Diagnosis Date   CAD (coronary artery disease)    Acute myocardial infarction treated with TPA in 1990; 1999-stents to circumflex and RCA; residual total occlusion of the left anterior descending; normal ejection fraction; stress nuclear in 2001-distal anteroseptal ischemia; normal LV function   CKD (chronic kidney disease) stage 4, GFR 15-29 ml/min (HCC)    First degree heart block    History of gastric ulcer    History of kidney stones    History of MI (myocardial infarction)    1990-  treated w/ TPA   Hyperlipidemia    Hypertension    Jaundice    OA (osteoarthritis)    Prediabetes    Prostate cancer (HCC)    Stage T1c , Gleason 3+4,  PSA 4.7,  vol 24cc--  scheduled for radiative seed implants   Psoriatic arthritis (HCC)    RA (rheumatoid arthritis) (HCC)    Dr. Beatrix Fetters   Sinus bradycardia    Wears dentures    Past Surgical History:  Past Surgical History:  Procedure Laterality Date   BIOPSY  08/27/2019   Procedure: BIOPSY;  Surgeon: Malissa Hippo, MD;  [facility];  Service: Endoscopy;;  gastric   BIOPSY  04/03/2022   Procedure: BIOPSY;  Surgeon: Dolores Frame, MD;  [facility];  Service: Gastroenterology;;   BIOPSY  04/21/2023   Procedure: BIOPSY;  Surgeon: Lanelle Bal, DO;  [facility];  Service: Endoscopy;;   callus removal Left 09/13/2022   left foot   CARDIOVASCULAR STRESS TEST  2001  per Dr Dietrich Pates clinic  note   distal anteroseptal ischemia,  normal LVF   COLONOSCOPY  last one 2006 (approx)   COLONOSCOPY WITH PROPOFOL N/A 04/03/2022   Procedure: COLONOSCOPY WITH PROPOFOL;  Surgeon: Dolores Frame, MD;  [facility];  Service: Gastroenterology;  Laterality: N/A;  945   CORONARY ANGIOPLASTY WITH STENT PLACEMENT  1999   DES x2  to CFX and RCA/  residual total occlusion LAD,  normal LVEF   ENTEROSCOPY N/A 04/24/2023   Procedure: ENTEROSCOPY;  Surgeon: Dolores Frame, MD;  [facility];  Service: Gastroenterology;  Laterality: N/A;   ESOPHAGOGASTRODUODENOSCOPY (EGD) WITH PROPOFOL N/A 08/27/2019   Procedure: ESOPHAGOGASTRODUODENOSCOPY (EGD) WITH PROPOFOL;  Surgeon: Malissa Hippo, MD;  [facility];  Service: Endoscopy;  Laterality: N/A;  10:10   ESOPHAGOGASTRODUODENOSCOPY (EGD) WITH PROPOFOL N/A 04/03/2022   Procedure: ESOPHAGOGASTRODUODENOSCOPY (EGD) WITH PROPOFOL;  Surgeon: Dolores Frame, MD;  [facility];  Service: Gastroenterology;  Laterality: N/A;   ESOPHAGOGASTRODUODENOSCOPY (EGD) WITH PROPOFOL N/A 02/16/2023   Procedure: ESOPHAGOGASTRODUODENOSCOPY (EGD) WITH PROPOFOL;  Surgeon: Dolores Frame, MD;  [facility];  Service: Gastroenterology;  Laterality: N/A;   ESOPHAGOGASTRODUODENOSCOPY (EGD) WITH PROPOFOL N/A 04/21/2023   Procedure: ESOPHAGOGASTRODUODENOSCOPY (EGD) WITH PROPOFOL;  Surgeon: Lanelle Bal, DO;  [facility];  Service: Endoscopy;  Laterality: N/A;   EYE SURGERY Bilateral    cataract   GIVENS CAPSULE STUDY N/A 04/22/2023  Procedure: GIVENS CAPSULE STUDY;  Surgeon: Dolores Frame, MD;  [facility];  Service: Gastroenterology;  Laterality: N/A;   HEMOSTASIS CLIP PLACEMENT  04/24/2023   Procedure: HEMOSTASIS CLIP PLACEMENT;  Surgeon: Dolores Frame, MD;  [facility];  Service: Gastroenterology;;   HOT HEMOSTASIS  04/03/2022    Procedure: HOT HEMOSTASIS (ARGON PLASMA COAGULATION/BICAP);  Surgeon: Marguerita Merles, Reuel Boom, MD;  [facility];  Service: Gastroenterology;;   HOT HEMOSTASIS  04/24/2023   Procedure: HOT HEMOSTASIS (ARGON PLASMA COAGULATION/BICAP);  Surgeon: Marguerita Merles, Reuel Boom, MD;  [facility];  Service: Gastroenterology;;   IR FLUORO GUIDE CV LINE RIGHT  05/30/2023   IR US GUIDE VASC ACCESS RIGHT  05/30/2023   POLYPECTOMY  04/03/2022   Procedure: POLYPECTOMY;  Surgeon: Dolores Frame, MD;  [facility];  Service: Gastroenterology;;   POSTERIOR CERVICAL FUSION/FORAMINOTOMY N/A 05/26/2017   Procedure: RIGHT C4-5 FORAMINOTOMY WITH EXCISION OF HERNIATED DISC;  Surgeon: Kerrin Champagne, MD;  Location: [facility];  Service: Orthopedics;  Laterality: N/A;   RADIOACTIVE SEED IMPLANT N/A 01/11/2016   Procedure: RADIOACTIVE SEED IMPLANT/BRACHYTHERAPY IMPLANT;  Surgeon: Marcine Matar, MD;  Location: [facility];  Service: Urology;  Laterality: N/A;   73  seeds implanted no seeds founds in bladder   SCLEROTHERAPY  04/24/2023   Procedure: SCLEROTHERAPY;  Surgeon: Marguerita Merles, Reuel Boom, MD;  [facility];  Service: Gastroenterology;;   SUBMUCOSAL TATTOO INJECTION  04/24/2023   Procedure: SUBMUCOSAL TATTOO INJECTION;  Surgeon: Dolores Frame, MD;  [facility];  Service: Gastroenterology;;    Assessment & Plan Clinical Impression: .Jacob Farmer is a 77 year old male who presented to the ED on 09/11/2023 complaining of left sided weakness, left arm numbness and left facial droop. He was given TNK to treat his stroke and he experienced improvement in symptoms. A follow-up CT scan on 9/28 hemorrhagic transformation of his infarct was noted. He was monitored in the ICU and transferred out on 9/29. Follow-up CT head without change in the appearance of the hemorrhagic infarction in the right posterior frontal vertex. As part of his  workup, he underwent 2D echo with ejection fraction of 60 to 65% and grade 1 diastolic dysfunction. Hemoglobin A1c 5.3%. He was started on heparin subcutaneously for VTE prophylaxis. He is now on aspirin 81 mg daily. Past medical history significant for atrial fibrillation and cardiology was consulted during this admission. They are considering Watchman device if the patient is able to tolerate short-term anticoagulation. He will follow-up as an outpatient. His past medical history is also significant for recent gastrointestinal bleed in May. 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 Platelet use discussed with GI service and okayed to start aspirin 81 mg daily. The patient is maintained on amlodipine 10 mg daily and hydralazine 25 mg twice daily for hypertension. The patient undergoes hemodialysis via tunneled dialysis catheter on Mondays Wednesdays and Fridays. Nephrology will continue to follow. Medications for rheumatoid arthritis continue. He is noted loose stools and testing for C. difficile toxin is negative. Currently receiving Loperamide 2 mg every 8 hours. Is able to complete supine to sit with supervision and use of hospital bed features yesterday. He is self-limiting with gait distance is secondary to decreased balance, fatigue and incontinent bowel movement.The patient requires inpatient medicine and rehabilitation evaluations and services for ongoing  dysfunction secondary to CVA with hemorrhagic conversion.   Patient transferred to CIR on 09/16/2023 .    Patient currently requires min with basic self-care skills secondary to decreased cardiorespiratoy endurance, decreased coordination, and decreased standing balance, hemiplegia, decreased balance strategies, and activity tolerance with low blood pressure .  Prior to hospitalization, patient was fully independent and active.  Patient will  benefit from skilled intervention to increase independence with basic self-care skills prior to discharge home with care partner.  Anticipate patient will require intermittent supervision and follow up home health.  OT - End of Session Endurance Deficit: Yes Endurance Deficit Description: can not tolerate standing more than 1-2 min without hypotension OT Assessment Rehab Potential (ACUTE ONLY): Good OT Patient demonstrates impairments in the following area(s): Balance;Endurance;Motor;Sensory OT Basic ADL's Functional Problem(s): Bathing;Dressing;Toileting OT Transfers Functional Problem(s): Toilet OT Additional Impairment(s): Fuctional Use of Upper Extremity OT Plan OT Intensity: Minimum of 1-2 x/day, 45 to 90 minutes OT Frequency: 5 out of 7 days OT Duration/Estimated Length of Stay: 7-9 days OT Treatment/Interventions: Social worker;Discharge planning;Functional mobility training;Neuromuscular re-education;Psychosocial support;Patient/family education;Pain management;Self Care/advanced ADL retraining;UE/LE Strength taining/ROM;Therapeutic Exercise;Therapeutic Activities;UE/LE Coordination activities OT Self Feeding Anticipated Outcome(s): independent OT Basic Self-Care Anticipated Outcome(s): supervision OT Toileting Anticipated Outcome(s): supervision OT Bathroom Transfers Anticipated Outcome(s): supervision OT Recommendation Patient destination: Home Follow Up Recommendations: Home health OT Equipment Recommended: 3 in 1 bedside comode   OT Evaluation Precautions/Restrictions  Precautions Precautions: Fall Precaution Comments: L hemiparesis Restrictions Weight Bearing Restrictions: No   Pain Pain Assessment Pain Score: 0-No pain Home Living/Prior Functioning Home Living Family/patient expects to be discharged to:: Private residence Living Arrangements: Spouse/significant other Available Help at Discharge: Family, Available  24 hours/day Type of Home: House Home Access: Stairs to enter Secretary/administrator of Steps: 2 Entrance Stairs-Rails: None Home Layout: One level Bathroom Shower/Tub: Health visitor: Standard  Lives With: Spouse Prior Function Level of Independence: Independent with basic ADLs, Independent with gait, Independent with homemaking with ambulation, Independent with transfers  Able to Take Stairs?: Yes Driving: Yes Vocation: Retired Administrator, sports Baseline Vision/History: 0 No visual deficits Ability to See in Adequate Light: 0 Adequate Patient Visual Report: No change from baseline Vision Assessment?: No apparent visual deficits Perception  Perception: Within Functional Limits Praxis Praxis: WFL Cognition Cognition Overall Cognitive Status: Within Functional Limits for tasks assessed Orientation Level: Person;Place;Situation Person: Oriented Place: Oriented Situation: Oriented Memory: Appears intact Focused Attention: Appears intact Safety/Judgment: Appears intact Brief Interview for Mental Status (BIMS) Repetition of Three Words (First Attempt): 3 Temporal Orientation: Year: Correct Temporal Orientation: Month: Accurate within 5 days Temporal Orientation: Day: Correct Recall: "Sock": Yes, no cue required Recall: "Blue": Yes, no cue required Recall: "Bed": Yes, no cue required BIMS Summary Score: 15 Sensation Sensation Light Touch: Appears Intact Hot/Cold: Appears Intact Proprioception: Impaired by gross assessment Stereognosis: Impaired by gross assessment Coordination Gross Motor Movements are Fluid and Coordinated: No Fine Motor Movements are Fluid and Coordinated: No Coordination and Movement Description: mild L side hemiparesis, L hand motor impersistence (drops items easily) Motor  Motor Motor: Hemiplegia  Trunk/Postural Assessment  Postural Control Postural Control: Within Functional Limits  Balance Static Standing Balance Static Standing -  Level of Assistance: 4: Min assist Dynamic Standing Balance Dynamic Standing - Level of Assistance: 4: Min assist Extremity/Trunk Assessment RUE Assessment RUE Assessment: Within Functional Limits LUE Assessment Active Range of Motion (AROM) Comments: WFL General Strength Comments: 4-/5  Care Tool Care Tool Self Care Eating   Eating Assist  Level: Set up assist    Oral Care    Oral Care Assist Level: Set up assist    Bathing   Body parts bathed by patient: Right arm;Left arm;Chest;Abdomen;Front perineal area;Buttocks;Right upper leg;Face;Left lower leg;Right lower leg;Left upper leg     Assist Level: Contact Guard/Touching assist    Upper Body Dressing(including orthotics)   What is the patient wearing?: Pull over shirt   Assist Level: Supervision/Verbal cueing    Lower Body Dressing (excluding footwear)   What is the patient wearing?: Underwear/pull up;Pants Assist for lower body dressing: Minimal Assistance - Patient > 75%    Putting on/Taking off footwear   What is the patient wearing?: Ted hose;Shoes Assist for footwear: Minimal Assistance - Patient > 75%       Care Tool Toileting Toileting activity   Assist for toileting: Minimal Assistance - Patient > 75%     Care Tool Bed Mobility Roll left and right activity        Sit to lying activity   Sit to lying assist level: Supervision/Verbal cueing    Lying to sitting on side of bed activity   Lying to sitting on side of bed assist level: the ability to move from lying on the back to sitting on the side of the bed with no back support.: Supervision/Verbal cueing     Care Tool Transfers Sit to stand transfer   Sit to stand assist level: Contact Guard/Touching assist    Chair/bed transfer   Chair/bed transfer assist level: Contact Guard/Touching assist     Toilet transfer   Assist Level: Minimal Assistance - Patient > 75%     Care Tool Cognition  Expression of Ideas and Wants Expression of Ideas and  Wants: 4. Without difficulty (complex and basic) - expresses complex messages without difficulty and with speech that is clear and easy to understand  Understanding Verbal and Non-Verbal Content Understanding Verbal and Non-Verbal Content: 4. Understands (complex and basic) - clear comprehension without cues or repetitions   Memory/Recall Ability     Refer to Care Plan for Long Term Goals  SHORT TERM GOAL WEEK 1 OT Short Term Goal 1 (Week 1): STGs = LTGs  Recommendations for other services: None    Skilled Therapeutic Intervention ADL ADL ADL Comments: CGA overall with self care due to low blood pressure in standing   Pt seen for initial evaluation and ADL training with a focus on activity tolerance with low blood pressure. Reviewed role of OT, discussed POC and pt's goals, and ELOS. Pt able to complete bathing and dressing with 3 sit to stands and standing 1-2 minutes at a time with rest breaks in between.  Pt resting in bed With all needs met and bed alarm set.        Discharge Criteria: Patient will be discharged from OT if patient refuses treatment 3 consecutive times without medical reason, if treatment goals not met, if there is a change in medical status, if patient makes no progress towards goals or if patient is discharged from hospital.  The above assessment, treatment plan, treatment alternatives and goals were discussed and mutually agreed upon: by patient and by family  Arien Morine 09/17/2023, 1:16 PM

## 2023-09-17 NOTE — Progress Notes (Signed)
Inpatient Rehabilitation Admission Medication Review by a Pharmacist  A complete drug regimen review was completed for this patient to identify any potential clinically significant medication issues.  High Risk Drug Classes Is patient taking? 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    Other  PTA meds: Firmagon Dupixent Eligard Restart PTA meds when and if necessary during CIR admission or at time of discharge, if warranted    Clinically significant medication issues were identified that warrant physician communication and completion of prescribed/recommended actions by midnight of the next day:  No    Time spent performing this drug regimen review (minutes):  30   Deran Barro BS, PharmD, BCPS Clinical Pharmacist 09/17/2023 7:11 AM  Contact: [phone] after 3 PM  "Be curious, not judgmental..." -Debbora Dus

## 2023-09-17 NOTE — Progress Notes (Signed)
Slept well last night. Denies pain. No loose stools. Refused Imodium. Wife at bedside. Therapy evaluation today. Safety maintained.

## 2023-09-17 NOTE — Plan of Care (Signed)
  Problem: RH Balance Goal: LTG Patient will maintain dynamic standing with ADLs (OT) Description: LTG:  Patient will maintain dynamic standing balance with assist during activities of daily living (OT)  Flowsheets (Taken 09/17/2023 1321) LTG: Pt will maintain dynamic standing balance during ADLs with: Supervision/Verbal cueing   Problem: Sit to Stand Goal: LTG:  Patient will perform sit to stand in prep for activites of daily living with assistance level (OT) Description: LTG:  Patient will perform sit to stand in prep for activites of daily living with assistance level (OT) Flowsheets (Taken 09/17/2023 1321) LTG: PT will perform sit to stand in prep for activites of daily living with assistance level: Supervision/Verbal cueing   Problem: RH Bathing Goal: LTG Patient will bathe all body parts with assist levels (OT) Description: LTG: Patient will bathe all body parts with assist levels (OT) Flowsheets (Taken 09/17/2023 1321) LTG: Pt will perform bathing with assistance level/cueing: Supervision/Verbal cueing LTG: Position pt will perform bathing:  Edge of bed  At sink   Problem: RH Dressing Goal: LTG Patient will perform lower body dressing w/assist (OT) Description: LTG: Patient will perform lower body dressing with assist, with/without cues in positioning using equipment (OT) Flowsheets (Taken 09/17/2023 1321) LTG: Pt will perform lower body dressing with assistance level of: Supervision/Verbal cueing   Problem: RH Toileting Goal: LTG Patient will perform toileting task (3/3 steps) with assistance level (OT) Description: LTG: Patient will perform toileting task (3/3 steps) with assistance level (OT)  Flowsheets (Taken 09/17/2023 1321) LTG: Pt will perform toileting task (3/3 steps) with assistance level: Supervision/Verbal cueing   Problem: RH Functional Use of Upper Extremity Goal: LTG Patient will use RT/LT upper extremity as a (OT) Description: LTG: Patient will use right/left  upper extremity as a stabilizer/gross assist/diminished/nondominant/dominant level with assist, with/without cues during functional activity (OT) Flowsheets (Taken 09/17/2023 1321) LTG: Use of upper extremity in functional activities: LUE as diminished level LTG: Pt will use upper extremity in functional activity with assistance level of: Independent   Problem: RH Toilet Transfers Goal: LTG Patient will perform toilet transfers w/assist (OT) Description: LTG: Patient will perform toilet transfers with assist, with/without cues using equipment (OT) Flowsheets (Taken 09/17/2023 1321) LTG: Pt will perform toilet transfers with assistance level of: Supervision/Verbal cueing

## 2023-09-17 NOTE — Progress Notes (Signed)
SW met with patient and spouse, introduced self and explained role. Patient anticipates discharging home with spouse to assist. No additional questions or concerns currently.   Patient current D/C: 10/9.

## 2023-09-17 NOTE — Evaluation (Signed)
Physical Therapy Assessment and Plan  Patient Details  Name: Jacob Farmer MRN: [id number] Date of Birth: 09-22-1946  PT Diagnosis: Difficulty walking, Dizziness and giddiness, Hemiparesis non-dominant, Low back pain, and Muscle weakness Rehab Potential: Good ELOS: 10-14 days   Today's Date: 09/17/2023 PT Individual Time: 0807-0903 PT Individual Time Calculation (min): 56 min    Hospital Problem: Principal Problem:   Parietal lobe infarction [facility])   Past Medical History:  Past Medical History:  Diagnosis Date   CAD (coronary artery disease)    Acute myocardial infarction treated with TPA in 1990; 1999-stents to circumflex and RCA; residual total occlusion of the left anterior descending; normal ejection fraction; stress nuclear in 2001-distal anteroseptal ischemia; normal LV function   CKD (chronic kidney disease) stage 4, GFR 15-29 ml/min (HCC)    First degree heart block    History of gastric ulcer    History of kidney stones    History of MI (myocardial infarction)    1990-  treated w/ TPA   Hyperlipidemia    Hypertension    Jaundice    OA (osteoarthritis)    Prediabetes    Prostate cancer (HCC)    Stage T1c , Gleason 3+4,  PSA 4.7,  vol 24cc--  scheduled for radiative seed implants   Psoriatic arthritis (HCC)    RA (rheumatoid arthritis) (HCC)    Dr. Beatrix Fetters   Sinus bradycardia    Wears dentures    Past Surgical History:  Past Surgical History:  Procedure Laterality Date   BIOPSY  08/27/2019   Procedure: BIOPSY;  Surgeon: Malissa Hippo, MD;  [facility];  Service: Endoscopy;;  gastric   BIOPSY  04/03/2022   Procedure: BIOPSY;  Surgeon: Dolores Frame, MD;  [facility];  Service: Gastroenterology;;   BIOPSY  04/21/2023   Procedure: BIOPSY;  Surgeon: Lanelle Bal, DO;  [facility];  Service: Endoscopy;;   callus removal Left 09/13/2022   left foot   CARDIOVASCULAR STRESS TEST  2001  per Dr Dietrich Pates clinic  note   distal anteroseptal ischemia,  normal LVF   COLONOSCOPY  last one 2006 (approx)   COLONOSCOPY WITH PROPOFOL N/A 04/03/2022   Procedure: COLONOSCOPY WITH PROPOFOL;  Surgeon: Dolores Frame, MD;  [facility];  Service: Gastroenterology;  Laterality: N/A;  945   CORONARY ANGIOPLASTY WITH STENT PLACEMENT  1999   DES x2  to CFX and RCA/  residual total occlusion LAD,  normal LVEF   ENTEROSCOPY N/A 04/24/2023   Procedure: ENTEROSCOPY;  Surgeon: Dolores Frame, MD;  [facility];  Service: Gastroenterology;  Laterality: N/A;   ESOPHAGOGASTRODUODENOSCOPY (EGD) WITH PROPOFOL N/A 08/27/2019   Procedure: ESOPHAGOGASTRODUODENOSCOPY (EGD) WITH PROPOFOL;  Surgeon: Malissa Hippo, MD;  [facility];  Service: Endoscopy;  Laterality: N/A;  10:10   ESOPHAGOGASTRODUODENOSCOPY (EGD) WITH PROPOFOL N/A 04/03/2022   Procedure: ESOPHAGOGASTRODUODENOSCOPY (EGD) WITH PROPOFOL;  Surgeon: Dolores Frame, MD;  [facility];  Service: Gastroenterology;  Laterality: N/A;   ESOPHAGOGASTRODUODENOSCOPY (EGD) WITH PROPOFOL N/A 02/16/2023   Procedure: ESOPHAGOGASTRODUODENOSCOPY (EGD) WITH PROPOFOL;  Surgeon: Dolores Frame, MD;  [facility];  Service: Gastroenterology;  Laterality: N/A;   ESOPHAGOGASTRODUODENOSCOPY (EGD) WITH PROPOFOL N/A 04/21/2023   Procedure: ESOPHAGOGASTRODUODENOSCOPY (EGD) WITH PROPOFOL;  Surgeon: Lanelle Bal, DO;  [facility];  Service: Endoscopy;  Laterality: N/A;   EYE SURGERY Bilateral    cataract   GIVENS CAPSULE STUDY N/A 04/22/2023  Procedure: GIVENS CAPSULE STUDY;  Surgeon: Dolores Frame, MD;  [facility];  Service: Gastroenterology;  Laterality: N/A;   HEMOSTASIS CLIP PLACEMENT  04/24/2023   Procedure: HEMOSTASIS CLIP PLACEMENT;  Surgeon: Dolores Frame, MD;  [facility];  Service: Gastroenterology;;   HOT HEMOSTASIS  04/03/2022    Procedure: HOT HEMOSTASIS (ARGON PLASMA COAGULATION/BICAP);  Surgeon: Marguerita Merles, Reuel Boom, MD;  [facility];  Service: Gastroenterology;;   HOT HEMOSTASIS  04/24/2023   Procedure: HOT HEMOSTASIS (ARGON PLASMA COAGULATION/BICAP);  Surgeon: Marguerita Merles, Reuel Boom, MD;  [facility];  Service: Gastroenterology;;   IR FLUORO GUIDE CV LINE RIGHT  05/30/2023   IR US GUIDE VASC ACCESS RIGHT  05/30/2023   POLYPECTOMY  04/03/2022   Procedure: POLYPECTOMY;  Surgeon: Dolores Frame, MD;  [facility];  Service: Gastroenterology;;   POSTERIOR CERVICAL FUSION/FORAMINOTOMY N/A 05/26/2017   Procedure: RIGHT C4-5 FORAMINOTOMY WITH EXCISION OF HERNIATED DISC;  Surgeon: Kerrin Champagne, MD;  Location: [facility] OR;  Service: Orthopedics;  Laterality: N/A;   RADIOACTIVE SEED IMPLANT N/A 01/11/2016   Procedure: RADIOACTIVE SEED IMPLANT/BRACHYTHERAPY IMPLANT;  Surgeon: Marcine Matar, MD;  Location: [facility];  Service: Urology;  Laterality: N/A;   73  seeds implanted no seeds founds in bladder   SCLEROTHERAPY  04/24/2023   Procedure: SCLEROTHERAPY;  Surgeon: Marguerita Merles, Reuel Boom, MD;  [facility];  Service: Gastroenterology;;   SUBMUCOSAL TATTOO INJECTION  04/24/2023   Procedure: SUBMUCOSAL TATTOO INJECTION;  Surgeon: Dolores Frame, MD;  [facility];  Service: Gastroenterology;;    Assessment & Plan Clinical Impression: Patient is a 77 y.o. male who presented to the ED on 09/11/2023 complaining of left sided weakness, left arm numbness and left facial droop. He was given TNK to treat his stroke and he experienced improvement in symptoms. A follow-up CT scan on 9/28 hemorrhagic transformation of his infarct was noted. He was monitored in the ICU and transferred out on 9/29. Follow-up CT head without change in the appearance of the hemorrhagic infarction in the right posterior frontal vertex. As part of his workup, he  underwent 2D echo with ejection fraction of 60 to 65% and grade 1 diastolic dysfunction. Hemoglobin A1c 5.3%. He was started on heparin subcutaneously for VTE prophylaxis. He is now on aspirin 81 mg daily. Past medical history significant for atrial fibrillation and cardiology was consulted during this admission. They are considering Watchman device if the patient is able to tolerate short-term anticoagulation. He will follow-up as an outpatient. His past medical history is also significant for recent gastrointestinal bleed in May. 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 Platelet use discussed with GI service and okayed to start aspirin 81 mg daily. The patient is maintained on amlodipine 10 mg daily and hydralazine 25 mg twice daily for hypertension. The patient undergoes hemodialysis via tunneled dialysis catheter on Mondays Wednesdays and Fridays. Nephrology will continue to follow. Medications for rheumatoid arthritis continue. He is noted loose stools and testing for C. difficile toxin is negative. Currently receiving Loperamide 2 mg every 8 hours. Is able to complete supine to sit with supervision and use of hospital bed features yesterday. He is self-limiting with gait distance is secondary to decreased balance, fatigue and incontinent bowel movement.The patient requires inpatient medicine and rehabilitation evaluations and services for ongoing dysfunction secondary  to CVA with hemorrhagic conversion. Patient transferred to CIR on 09/16/2023 .   Patient currently requires min assist with mobility secondary to muscle weakness, decreased cardiorespiratoy endurance, unbalanced muscle activation, decreased attention to left, decreased safety awareness, and decreased standing balance, decreased balance strategies, and hemipareisis .  Prior to hospitalization, patient was modified independent  with  mobility and lived with Spouse in a House home.  Home access is 2Stairs to enter.  Patient will benefit from skilled PT intervention to maximize safe functional mobility, minimize fall risk, and decrease caregiver burden for planned discharge home with 24 hour assist.  Anticipate patient will benefit from follow up [facility] at discharge.  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) PT Patient demonstrates impairments in the following area(s): Balance;Endurance;Motor;Safety;Perception PT Transfers Functional Problem(s): Bed Mobility;Bed to Chair;Car;Furniture PT Locomotion Functional Problem(s): Ambulation;Stairs PT Plan PT Intensity: Minimum of 1-2 x/day ,45 to 90 minutes PT Frequency: 5 out of 7 days PT Duration Estimated Length of Stay: 10-14 days PT Treatment/Interventions: Ambulation/gait training;Community reintegration;DME/adaptive equipment instruction;Neuromuscular re-education;Stair training;Psychosocial support;UE/LE Strength taining/ROM;Balance/vestibular training;Discharge planning;Pain management;Skin care/wound management;Therapeutic Activities;UE/LE Coordination activities;Visual/perceptual remediation/compensation;Therapeutic Exercise;Splinting/orthotics;Patient/family education;Cognitive remediation/compensation;Disease management/prevention;Functional mobility training PT Transfers Anticipated Outcome(s): Mod I/ supervision PT Locomotion Anticipated Outcome(s): Mod I/ supervision PT Recommendation Recommendations for Other Services: None Follow Up Recommendations: Home health PT;24 hour  supervision/assistance Patient destination: Home Equipment Recommended: To be determined   PT Evaluation Precautions/Restrictions Precautions Precautions: Fall Precaution Comments: L hemipareisis, orthostasis Restrictions Weight Bearing Restrictions: No General PT Amount of Missed Time (min): 15 Minutes PT Missed Treatment Reason: Other (Comment) (B knee pain from arthritis and pt discussing medical presentation with nephrology PA.) Vital SignsTherapy Vitals Temp: 97.7 F (36.5 C) Pulse Rate: 68 Resp: 14 BP: (!) 155/81 Oxygen Therapy SpO2: 93 % O2 Device: Room Air Patient Activity (if Appropriate): In bed Pulse Oximetry Type: Continuous Pain Pain Assessment Pain Scale: 0-10 Pain Score: 0-No pain Pain Interference Pain Interference Pain Effect on Sleep: 1. Rarely or not at all Pain Interference with Therapy Activities: 1. Rarely or not at all Pain Interference with Day-to-Day Activities: 2. Occasionally Home Living/Prior Functioning Home Living Available Help at Discharge: Family;Available 24 hours/day Type of Home: House Home Access: Stairs to enter [employer] of Steps: 2 Entrance Stairs-Rails: None Home Layout: One level Bathroom Shower/Tub: Health visitor: Standard Bathroom Accessibility: Yes  Lives With: Spouse Prior Function Level of Independence: Independent with basic ADLs;Independent with gait;Independent with homemaking with ambulation;Independent with transfers  Able to Take Stairs?: Yes Driving: Yes Vocation: Retired Optometrist - History Ability to See in Adequate Light: 0 Adequate Perception Perception: Within Functional Limits Praxis Praxis: WFL  Cognition Overall Cognitive Status: Within Functional Limits for tasks assessed Arousal/Alertness: Awake/alert Orientation Level: Oriented X4 Focused Attention: Appears intact Memory: Appears intact Problem Solving: Appears intact Safety/Judgment: Appears  intact Sensation Sensation Light Touch: Appears Intact Coordination Gross Motor Movements are Fluid and Coordinated: No Fine Motor Movements are Fluid and Coordinated: No Coordination and Movement Description: mild L side hemiparesis Heel Shin Test: DNT Motor  Motor Motor: Other (comment) (hemipareisis) Motor - Skilled Clinical Observations: mild L hemipareisis   Trunk/Postural Assessment  Cervical Assessment Cervical Assessment: Exceptions to [facility] (forward  head) Thoracic Assessment Thoracic Assessment: Exceptions to [facility] (rounded shoulders) Lumbar Assessment Lumbar Assessment: Within Functional Limits Postural Control Postural Control: Within Functional Limits  Balance Balance Balance Assessed: Yes Static Sitting Balance Static Sitting - Balance Support: Feet supported;No upper extremity supported Static Sitting - Level of Assistance: 5: Stand by assistance Dynamic Sitting Balance Dynamic Sitting - Balance Support: Feet supported;Bilateral upper extremity supported Dynamic Sitting - Level of Assistance: 5: Stand by assistance Static Standing Balance Static Standing - Balance Support: During functional activity;Bilateral upper extremity supported Static Standing - Level of Assistance: 4: Min assist Extremity Assessment      RLE Assessment RLE Assessment: Within Functional Limits RLE Strength RLE Overall Strength: Within Functional Limits for tasks assessed Right Hip Flexion: 4+/5 Right Hip Extension: 4/5 Right Hip ABduction: 4+/5 Right Hip ADduction: 4/5 Right Knee Flexion: 4/5 Right Knee Extension: 4+/5 Right Ankle Dorsiflexion: 4+/5 Right Ankle Plantar Flexion: 4+/5 Right Ankle Inversion: 4+/5 Right Ankle Eversion: 4+/5 LLE Assessment LLE Assessment: Exceptions to WFL LLE Strength LLE Overall Strength: Deficits Left Hip Flexion: 4/5 Left Hip Extension: 4-/5 Left Hip ABduction: 4/5 Left Hip ADduction: 4-/5 Left Knee Flexion: 4/5 Left Knee Extension:  4/5 Left Ankle Dorsiflexion: 4+/5 Left Ankle Plantar Flexion: 4+/5 Left Ankle Inversion: 4/5 Left Ankle Eversion: 4+/5  Care Tool Care Tool Bed Mobility Roll left and right activity   Roll left and right assist level: Supervision/Verbal cueing    Sit to lying activity   Sit to lying assist level: Supervision/Verbal cueing    Lying to sitting on side of bed activity   Lying to sitting on side of bed assist level: the ability to move from lying on the back to sitting on the side of the bed with no back support.: Supervision/Verbal cueing     Care Tool Transfers Sit to stand transfer   Sit to stand assist level: Minimal Assistance - Patient > 75%    Chair/bed transfer   Chair/bed transfer assist level: Minimal Assistance - Patient > 75%     Toilet transfer   Assist Level: Minimal Assistance - Patient > 75%    Car transfer Car transfer activity did not occur: Safety/medical concerns        Care Tool Locomotion Ambulation Ambulation activity did not occur: Safety/medical concerns        Walk 10 feet activity Walk 10 feet activity did not occur: Safety/medical concerns       Walk 50 feet with 2 turns activity Walk 50 feet with 2 turns activity did not occur: Safety/medical concerns      Walk 150 feet activity Walk 150 feet activity did not occur: Safety/medical concerns      Walk 10 feet on uneven surfaces activity Walk 10 feet on uneven surfaces activity did not occur: Safety/medical concerns      Stairs Stair activity did not occur: Safety/medical concerns        Walk up/down 1 step activity Walk up/down 1 step or curb (drop down) activity did not occur: Safety/medical concerns      Walk up/down 4 steps activity Walk up/down 4 steps activity did not occur: Safety/medical concerns      Walk up/down 12 steps activity Walk up/down 12 steps activity did not occur: Safety/medical concerns      Pick up small objects from floor Pick up small object from the floor  (from standing position) activity did not occur: Safety/medical concerns      Wheelchair Is the patient using a wheelchair?: No Type of  Wheelchair: Media planner activity did not occur: Safety/medical concerns      Wheel 50 feet with 2 turns activity Wheelchair 50 feet with 2 turns activity did not occur: Safety/medical concerns    Wheel 150 feet activity Wheelchair 150 feet activity did not occur: Safety/medical concerns      Refer to Care Plan for Long Term Goals  SHORT TERM GOAL WEEK 1 PT Short Term Goal 1 (Week 1): Pt will properly utilize TED hose and abdominal binder to assist with orthostatic hypotension. PT Short Term Goal 2 (Week 1): Pt will perform bed mobility with distant supervision and less c/o dizziness. PT Short Term Goal 3 (Week 1): Pt will perform functional standing transfers with overall light CGA. PT Short Term Goal 4 (Week 1): Pt will initiate gait training using LRAD. PT Short Term Goal 5 (Week 1): Pt will initiate stair training with no HR assist using LRAD.  Recommendations for other services: None   Skilled Therapeutic Intervention Mobility Bed Mobility Bed Mobility: Rolling Right;Rolling Left;Supine to Sit;Sit to Supine Rolling Right: Supervision/verbal cueing Rolling Left: Supervision/Verbal cueing Supine to Sit: Supervision/Verbal cueing (requires extra time) Sit to Supine: Supervision/Verbal cueing Transfers Transfers: Sit to Stand;Stand to Sit Sit to Stand: Minimal Assistance - Patient > 75% Stand to Sit: Minimal Assistance - Patient > 75% Transfer (Assistive device): Rolling walker Locomotion  Gait Ambulation: No Gait Gait: No Stairs / Additional Locomotion Stairs: No Wheelchair Mobility Wheelchair Mobility: No  Skilled Intervention:  PT Evaluation completed; see above for results. PT educated patient in roles of PT vs OT, PT POC, rehab potential, rehab goals, and discharge recommendations along with recommendation for follow-up  rehabilitation services. Individual treatment initiated:  Patient supine in bed upon PT arrival. Patient alert and agreeable to PT session. No pain complaint during session.  Appropriately sized w/c and RW acquired for pt use and fit to pt.   Therapeutic Activity: Bed Mobility: Patient performed supine to/from sit with supervision using bed features. No cueing provided.  Transfers: Patient performed sit <> stand transfer from lowered bed surface with light MinA. Provided vc/tc for increasing forward lean for more appropriate weight shift. Upon standing, pt relates increased dizziness. Attempt to remain standing and is able to 20sec more, then relates continued symptoms and need to sit. Second stand performed with minA and return of dizziness symptoms. Return to sit and BP taken with reading of 101/ 69 (80), pulse 86 bpm. MD enters room with observing resident. Discussion re: BP with orthostasis. MD to look into medications and make adjustments as necessary. Following rounding, pt again stands with side step closer to [facility] and again c/o dizziness. Return to supine with supervision and able to center self in bed for better neutral positioning.   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 Pt has had one treatment session and will need to continue treatments.   After session, team conference held and MD places order for TED hose and abd binder for wear while OOB.   Patient supine in bed at end of session with brakes locked, bed alarm set, and all needs within reach. Reduction in symptoms in supine.    Discharge Criteria: Patient will be discharged from PT if patient refuses treatment 3 consecutive times without medical reason, if treatment goals not met, if there is a change in medical status, if patient makes no progress towards goals or if  patient is discharged from  hospital.  The above assessment, treatment plan, treatment alternatives and goals were discussed and mutually agreed upon: by patient and by family  Loel Dubonnet PT, DPT, CSRS 09/17/2023, 6:00 PM

## 2023-09-17 NOTE — Discharge Instructions (Addendum)
Inpatient Rehab Discharge Instructions  Jacob Farmer Discharge date and time:  09/25/2023 Activities/Precautions/ Functional Status: Activity: no lifting, driving, or strenuous exercise until cleared by MD Diet: renal diet Wound Care: none needed Functional status:  ___ No restrictions     ___ Walk up steps independently _x__ 24/7 supervision/assistance   ___ Walk up steps with assistance ___ Intermittent supervision/assistance  ___ Bathe/dress independently ___ Walk with walker     ___ Bathe/dress with assistance ___ Walk Independently    ___ Shower independently ___ Walk with assistance    __x_ Shower with assistance __x_ No alcohol     ___ Return to work/school ________ Special Instructions: No driving, alcohol consumption or tobacco use.          STROKE/TIA DISCHARGE INSTRUCTIONS SMOKING Cigarette smoking nearly doubles your risk of having a stroke & is the single most alterable risk factor  If you smoke or have smoked in the last 12 months, you are advised to quit smoking for your health. Most of the excess cardiovascular risk related to smoking disappears within a year of stopping. Ask you doctor about anti-smoking medications [place] Quit Line: 1-800-QUIT NOW Free Smoking Cessation Classes [phone]  CHOLESTEROL Know your levels; limit fat & cholesterol in your diet  Lipid Panel     Component Value Date/Time   CHOL 123 09/12/2023 0819   TRIG 92 09/12/2023 0819   HDL 64 09/12/2023 0819   CHOLHDL 1.9 09/12/2023 0819   VLDL 18 09/12/2023 0819   LDLCALC 41 09/12/2023 0819   LDLCALC 42 01/31/2023 0859     Many patients benefit from treatment even if their cholesterol is at goal. Goal: Total Cholesterol (CHOL) less than 160 Goal:  Triglycerides (TRIG) less than 150 Goal:  HDL greater than 40 Goal:  LDL (LDLCALC) less than 100   BLOOD PRESSURE American Stroke Association blood pressure target is less that 120/80 mm/Hg  Your discharge blood pressure is:  BP: (!)  144/59 Monitor your blood pressure Limit your salt and alcohol intake Many individuals will require more than one medication for high blood pressure  DIABETES (A1c is a blood sugar average for last 3 months) Goal HGBA1c is under 7% (HBGA1c is blood sugar average for last 3 months)  Diabetes: No known diagnosis of diabetes    Lab Results  Component Value Date   HGBA1C 5.3 08/07/2023    Your HGBA1c can be lowered with medications, healthy diet, and exercise. Check your blood sugar as directed by your physician Call your physician if you experience unexplained or low blood sugars.  PHYSICAL ACTIVITY/REHABILITATION Goal is 30 minutes at least 4 days per week  Activity: Increase activity slowly, Therapies: Physical Therapy: Home Health and Occupational Therapy: Home Health Return to work: n/a Activity decreases your risk of heart attack and stroke and makes your heart stronger.  It helps control your weight and blood pressure; helps you relax and can improve your mood. Participate in a regular exercise program. Talk with your doctor about the best form of exercise for you (dancing, walking, swimming, cycling).  DIET/WEIGHT Goal is to maintain a healthy weight  Your discharge diet is:  Diet Order             Diet Heart Room service appropriate? Yes; Fluid consistency: Thin  Diet effective now                  thin liquids Your height is:  Height: 5\' 10"  (177.8 cm) Your current weight is:  Weight: 69.8 kg Your Body Mass Index (BMI) is:  BMI (Calculated): 22.08 Following the type of diet specifically designed for you will help prevent another stroke. Your goal weight range is:   Your goal Body Mass Index (BMI) is 19-24. Healthy food habits can help reduce 3 risk factors for stroke:  High cholesterol, hypertension, and excess weight.  RESOURCES Stroke/Support Group:  Call [phone]   STROKE EDUCATION PROVIDED/REVIEWED AND GIVEN TO PATIENT Stroke warning signs and symptoms How to  activate emergency medical system (call 911). Medications prescribed at discharge. Need for follow-up after discharge. Personal risk factors for stroke. Pneumonia vaccine given: No Flu vaccine given: No My questions have been answered, the writing is legible, and I understand these instructions.  I will adhere to these goals & educational materials that have been provided to me after my discharge from the hospital.     My questions have been answered and I understand these instructions. I will adhere to these goals and the provided educational materials after my discharge from the hospital.  Patient/Caregiver Signature _______________________________ Date __________  Clinician Signature _______________________________________ Date __________  Please bring this form and your medication list with you to all your follow-up doctor's appointments.

## 2023-09-18 DIAGNOSIS — I953 Hypotension of hemodialysis: Secondary | ICD-10-CM

## 2023-09-18 DIAGNOSIS — Z992 Dependence on renal dialysis: Secondary | ICD-10-CM

## 2023-09-18 DIAGNOSIS — N186 End stage renal disease: Secondary | ICD-10-CM

## 2023-09-18 DIAGNOSIS — I6389 Other cerebral infarction: Secondary | ICD-10-CM | POA: Diagnosis not present

## 2023-09-18 LAB — HEPATITIS B SURFACE ANTIGEN: Hepatitis B Surface Ag: NONREACTIVE

## 2023-09-18 MED ORDER — CHLORHEXIDINE GLUCONATE CLOTH 2 % EX PADS
6.0000 | MEDICATED_PAD | Freq: Every day | CUTANEOUS | Status: DC
  Administered 2023-09-20 – 2023-09-22 (×3): 6 via TOPICAL

## 2023-09-18 MED ORDER — AMLODIPINE BESYLATE 5 MG PO TABS
5.0000 mg | ORAL_TABLET | Freq: Every day | ORAL | Status: DC
  Administered 2023-09-19 – 2023-09-24 (×6): 5 mg via ORAL
  Filled 2023-09-18 (×6): qty 1

## 2023-09-18 MED ORDER — MELATONIN 5 MG PO TABS
5.0000 mg | ORAL_TABLET | Freq: Every evening | ORAL | Status: DC | PRN
  Administered 2023-09-18 (×2): 5 mg via ORAL
  Filled 2023-09-18 (×2): qty 1

## 2023-09-18 MED ORDER — MIDODRINE HCL 5 MG PO TABS: 2.5000 mg | ORAL_TABLET | Freq: Three times a day (TID) | ORAL | Status: DC

## 2023-09-18 NOTE — Patient Care Conference (Signed)
Inpatient RehabilitationTeam Conference and Plan of Care Update Date: 09/17/2023   Time: 10:18 AM    Patient Name: Jacob Farmer      Medical Record Number: [id number]  Date of Birth: Apr 02, 1946 Sex: Male         Room/Bed: 4W24C/4W24C-01 Payor Info: Payor: [employer] MEDICARE / Plan: [employer] MEDICARE HMO / Product Type: *No Product type* /    Admit Date/Time:  09/16/2023  3:25 PM  Primary Diagnosis:  Parietal lobe infarction [facility])  Hospital Problems: Principal Problem:   Parietal lobe infarction [facility])    Expected Discharge Date: Expected Discharge Date: 09/24/23  Team Members Present: Physician leading conference: Dr. Claudette Laws Social Worker Present: Lavera Guise, BSW Nurse Present: Chana Bode, RN PT Present: Ralph Leyden, PT OT Present: Primitivo Gauze, OT SLP Present: Everardo Pacific, SLP     Current Status/Progress Goal Weekly Team Focus  Bowel/Bladder   Continent of Bowel/ Oliguric (HD patient)   Maintain bowel function.   Assist with BR needs    Swallow/Nutrition/ Hydration               ADL's                Mobility   bed mobility = supervision, transfers = CGA/ light MinA, no ambulation on eval d/t dizziness and drop in BP  with standing from EOB. Symptoms improved minimally with repeated rise to stand.   overall supervision  Barriers: orthostasis, L  sided weakness /// Work on: L hemibody NMR, general strengthening, safety in transfers, ambulation, stair training, family educ.    Communication                Safety/Cognition/ Behavioral Observations               Pain   Denies   Remain pain free   Assess Q4 and prn    Skin   Intact. Just redened sacrum. Prophylactic dressing.   Maintain integrity  Assess QS      Discharge Planning:  atient anticipates discharging home with spouse to assist.   Team Discussion: Patient post mild ICH post TPA for parietal lobe infarct with orthostasis, left side weakness and fine motor  coordination issues. ESRD; HD changing to T-H-Sat rotation.  Patient on target to meet rehab goals: yes, currently needs CGA for sit - stand and min - CGA for maintaining balance. Goals for discharge set for supervision overall.  *See Care Plan and progress notes for long and short-term goals.   Revisions to Treatment Plan:  TTEDs and abd. Binder added for orthostatic hypotension   Teaching Needs: Safeety, medications, dietary modifications, transfers, toileting, etc.   Current Barriers to Discharge: Hemodialysis  Possible Resolutions to Barriers: Family education     Medical Summary Current Status: orthostatic hypotension, HD converting from M-W-F to Tu-Th -Sat  Barriers to Discharge: Medical stability;Hypotension   Possible Resolutions to [employer] Focus: Orthostatic precautions, coordinate with nephrology on BP and HD   Continued Need for Acute Rehabilitation Level of Care: The patient requires daily medical management by a physician with specialized training in physical medicine and rehabilitation for the following reasons: Direction of a multidisciplinary physical rehabilitation program to maximize functional independence : Yes Medical management of patient stability for increased activity during participation in an intensive rehabilitation regime.: Yes Analysis of laboratory values and/or radiology reports with any subsequent need for medication adjustment and/or medical intervention. : Yes   I attest that I was present, lead the team conference, and concur  with the assessment and plan of the team.   Pamelia Hoit 09/18/2023, 8:53 AM

## 2023-09-18 NOTE — Progress Notes (Signed)
Occupational Therapy Session Note  Patient Details  Name: Jacob Farmer MRN: [id number] Date of Birth: [birth year]  Today's Date: 09/18/2023 OT Individual Time: 1045-1200 OT Individual Time Calculation (min): 75 min    Short Term Goals: Week 1:  OT Short Term Goal 1 (Week 1): STGs = LTGs  Skilled Therapeutic Interventions/Progress Updates:    Pt received in bed and ready for therapy.  Pt dressed and declined a bath today.  Pt stated he was feeling much stronger today.  His wife was present and discussed his dc date next week.  Pt used RW to move to w/c and taken to ortho gym.  He then used RW to pivot to seat on arm bike.  Used arm bike at resistance 5 for 2 min at a time with 20-30 break for 12 minutes.   He then used RW to step to therapy mat. Sat on mat to work with a 4 lb med ball transferring ball from hand to hand and working on L hand /arm strength.  Pt c/o back pain due to unsupported sitting.  Pt then stepped to w/c.  BP 110/70, pt with no c/o lightheadedness. Pt taken back to his room, he then used RW to ambulate from door of room to bed with RW with close Supervision.  Pt got into bed and scooted up without A.  From bed in chair position, pt worked on L hand and finger strength using resistive clothes pins.  Pt did extremely well. Provided him with red theraputty to work on hand strength in the room.   Pt resting in bed with all needs met. Alarm set and call light in reach.    Therapy Documentation Precautions:  Precautions Precautions: Fall Precaution Comments: L hemipareisis, orthostasis Restrictions Weight Bearing Restrictions: No   Pain:  Pt did develop back pain in unsupported sit and requested to move back to bed -has hx of chronic pain   ADL: ADL ADL Comments: CGA overall with self care due to low blood pressure in standing   Therapy/Group: Individual Therapy  Cailyn Houdek 09/18/2023, 12:48 PM

## 2023-09-18 NOTE — Progress Notes (Signed)
Physical Therapy Session Note  Patient Details  Name: Jacob Farmer MRN: [id number] Date of Birth: 10/03/1946  Today's Date: 09/18/2023 PT Individual Time: 4098-1191 PT Individual Time Calculation (min): 78 min   Short Term Goals: Week 1:  PT Short Term Goal 1 (Week 1): Pt will properly utilize TED hose and abdominal binder to assist with orthostatic hypotension. PT Short Term Goal 2 (Week 1): Pt will perform bed mobility with distant supervision and less c/o dizziness. PT Short Term Goal 3 (Week 1): Pt will perform functional standing transfers with overall light CGA. PT Short Term Goal 4 (Week 1): Pt will initiate gait training using LRAD. PT Short Term Goal 5 (Week 1): Pt will initiate stair training with no HR assist using LRAD.  Skilled Therapeutic Interventions/Progress Updates: Patient supine in bed with wife present on entrance to room. Patient alert and agreeable to PT session.   Patient reported no new pain at beginning of PT session. Pt did report 3-4/10 on low back (chronic). Today's session focused on outcome measures and activity tolerance/LOA as pt was unable to do tasks at evaluation due to [facility] with increased symptomology (CARE tools updated).  Wife's biggest concern is pt ambulating home<>car for dialysis after d/c. PTA discussed with pt and wife about pt's current physical presentation and LOA with tasks performed. Pt mostly limited with ambulatory endurance due to low back pain and decreased strength due to hospital stay.   Therapeutic Activity: Bed Mobility: Pt performed supine<>sit on EOB with supervision (HOB slightly elevated). No VC required.  Transfers: Pt performed sit<>stand transfers throughout session with RW and with CGA at first for safety, then progressed to supervision. Provided VC for anterior scoot.  Patient demonstrates increased fall risk as noted by score of 29/56 on Berg Balance Scale.  (<36= high risk for falls, close to 100%; 37-45 significant >80%;  46-51 moderate >50%; 52-55 lower >25%)  Car transfer: Pt performed with supervision and demonstrates safe transfer with proper sequence with hand placement and RW management.   Gait Training:  Pt ambulated 140' using RW with CGA for safety. Pt demonstrated the following gait deviations with therapist providing the described cuing and facilitation for improvement:  - Narrow BOS, forward flexed posture (slightly), decreased step length. Pt demos decrease in BOS when making turns, but had no instance of LOB (performed 2, 90* turns in RW)  Stair Navigation: - Ascending 4 (6") steps with use of BHR, and with close supervision required. Pt with step to pattern and alternating leading LE. - Descending 4 (6") steps with use of BHR, and with close supervision required. Pt with step to pattern.  Pt required VC to ensure entirety of foot are on the step.   Patient supine in bed with wife present at end of session with brakes locked, bed alarm set, and all needs within reach.      Therapy Documentation Precautions:  Precautions Precautions: Fall Precaution Comments: L hemipareisis, orthostasis Restrictions Weight Bearing Restrictions: No   Therapy/Group: Individual Therapy  Deyonte Cadden PTA 09/18/2023, 3:31 PM

## 2023-09-18 NOTE — Progress Notes (Addendum)
[facility] Progress Note   Subjective:   Patient seen and examined at bedside. Orthostatic again today when going to PT limiting therapy.  States BP improved after he started riding the bike.  Feeling ok now.  HD tolerated well yesterday.    Objective Vitals:   09/17/23 1833 09/17/23 1836 09/17/23 1931 09/18/23 0556  BP:  (!) 168/78 (!) 140/77 139/61  Pulse:  79 81 74  Resp:  (!) 22 16 15   Temp:  98.8 F (37.1 C) (!) 96.5 F (35.8 C) 98.3 F (36.8 C)  SpO2:  100% 99% 100%  Weight: (S) 65 kg     Height:       Physical Exam General:chronically ill appearing male in NAD Heart:RRR, no mrg Lungs:CTAB, nml WOB on RA Abdomen:soft, NTND Extremities:no LE edema on exam Dialysis Access: [facility]   [facility] Weights   09/16/23 1537 09/17/23 1440 09/17/23 1833  Weight: 65.2 kg 65.1 kg (S) 65 kg    Intake/Output Summary (Last 24 hours) at 09/18/2023 1346 Last data filed at 09/18/2023 0800 Gross per 24 hour  Intake 120 ml  Output 0 ml  Net 120 ml    Additional Objective Labs: Basic Metabolic Panel: Recent Labs  Lab 09/14/23 0407 09/15/23 0738 09/16/23 0614 09/17/23 1549  NA 133* 134* 132* 132*  K 3.4* 3.9 3.9 3.8  CL 95* 94* 93* 95*  CO2 25 23 23 27   GLUCOSE 131* 107* 111* 140*  BUN 42* 66* 79* 44*  CREATININE 5.58* 8.33* 10.23* 7.02*  CALCIUM 9.1 8.6* 8.5* 8.5*  PHOS 3.9  --   --   --    Liver Function Tests: Recent Labs  Lab 09/11/23 2133 09/14/23 0407  AST 20 86*  ALT 16 25  ALKPHOS 98 77  BILITOT 0.5 0.8  PROT 6.9 6.3*  ALBUMIN 3.6 3.1*    CBC: Recent Labs  Lab 09/11/23 2133 09/11/23 2136 09/15/23 0738 09/16/23 0614 09/17/23 1549  WBC 6.1  --  13.6* 9.7 8.6  NEUTROABS 4.1  --   --   --  5.6  HGB 10.9*   < > 9.7* 9.8* 10.0*  HCT 33.4*   < > 29.6* 29.5* 29.9*  MCV 89.5  --  90.2 87.8 91.4  PLT 162  --  175 181 197   < > = values in this interval not displayed.    CBG: Recent Labs  Lab 09/11/23 2132 09/12/23 0105  GLUCAP 142*  116*    Medications:   allopurinol  100 mg Oral Daily   [START ON 09/19/2023] amLODipine  5 mg Oral QHS   aspirin EC  81 mg Oral Daily   calcitRIOL  0.25 mcg Oral Q M,W,F   Chlorhexidine Gluconate Cloth  6 each Topical Q0600   Chlorhexidine Gluconate Cloth  6 each Topical Q12H   feeding supplement  237 mL Oral BID BM   folic acid  0.5 mg Oral Daily   heparin injection (subcutaneous)  5,000 Units Subcutaneous Q8H   leflunomide  10 mg Oral Daily   lidocaine  1 patch Transdermal Q24H   pantoprazole  40 mg Oral BID   rosuvastatin  40 mg Oral Daily   sevelamer carbonate  800 mg Oral BID WC    Dialysis Orders: [facility] in [place]. RIJ TDC for the access.  MWF 4hrs 400/800 2K 2.5 Ca EDW 70.8 Venofer 50mg  q2wks Mircera q2wks Calcitriol 0.76mcg qHD No Heparin     Assessment/Plan: 1. Acute stroke - hemorrhagic  infarction of R posterior frontal vertex s/p TNK. Cardioembolic in setting of A fib most likely per neuro. Off anticoagulation. In CIR. 2. ESRD -on HD MWF.  HD tomorrow per regular schedule. NO HEPARIN.  3. Anemia of CKD- Hgb 10. Follow trends, may need to start ESA.   4. Secondary hyperparathyroidism - Ca and phos at goal. Continue VDRA and renvela when eating. 5. HTN/volume - Orthostatic hypotension last 2 days during PT.  Permissive hypertension d/t symptomatic orthostasis.  If resolves can work on improved BP control.  Hydralazine d/c yesterday. Decreased amlodipine to 5mg  and changed to at bedtime schedule.   Does not appear volume overloaded, UF as tolerated.  6. Nutrition - On heart healthy diet w/fluid restrictions. Monitor labs  [employer], PA-C [facility] 09/18/2023,1:46 PM  LOS: 2 days

## 2023-09-18 NOTE — Progress Notes (Signed)
Inpatient Rehabilitation Care Coordinator Assessment and Plan Patient Details  Name: Jacob Farmer MRN: [id number] Date of Birth: 1946/04/14  Today's Date: 09/18/2023  Hospital Problems: Principal Problem:   Parietal lobe infarction [facility])  Past Medical History:  Past Medical History:  Diagnosis Date   CAD (coronary artery disease)    Acute myocardial infarction treated with TPA in 1990; 1999-stents to circumflex and RCA; residual total occlusion of the left anterior descending; normal ejection fraction; stress nuclear in 2001-distal anteroseptal ischemia; normal LV function   CKD (chronic kidney disease) stage 4, GFR 15-29 ml/min (HCC)    First degree heart block    History of gastric ulcer    History of kidney stones    History of MI (myocardial infarction)    1990-  treated w/ TPA   Hyperlipidemia    Hypertension    Jaundice    OA (osteoarthritis)    Prediabetes    Prostate cancer (HCC)    Stage T1c , Gleason 3+4,  PSA 4.7,  vol 24cc--  scheduled for radiative seed implants   Psoriatic arthritis (HCC)    RA (rheumatoid arthritis) (HCC)    Dr. Beatrix Fetters   Sinus bradycardia    Wears dentures    Past Surgical History:  Past Surgical History:  Procedure Laterality Date   BIOPSY  08/27/2019   Procedure: BIOPSY;  Surgeon: Malissa Hippo, MD;  [facility];  Service: Endoscopy;;  gastric   BIOPSY  04/03/2022   Procedure: BIOPSY;  Surgeon: Dolores Frame, MD;  [facility];  Service: Gastroenterology;;   BIOPSY  04/21/2023   Procedure: BIOPSY;  Surgeon: Lanelle Bal, DO;  [facility];  Service: Endoscopy;;   callus removal Left 09/13/2022   left foot   CARDIOVASCULAR STRESS TEST  2001  per Dr Dietrich Pates clinic note   distal anteroseptal ischemia,  normal LVF   COLONOSCOPY  last one 2006 (approx)   COLONOSCOPY WITH PROPOFOL N/A 04/03/2022   Procedure: COLONOSCOPY WITH PROPOFOL;  Surgeon: Dolores Frame, MD;   [facility];  Service: Gastroenterology;  Laterality: N/A;  945   CORONARY ANGIOPLASTY WITH STENT PLACEMENT  1999   DES x2  to CFX and RCA/  residual total occlusion LAD,  normal LVEF   ENTEROSCOPY N/A 04/24/2023   Procedure: ENTEROSCOPY;  Surgeon: Dolores Frame, MD;  [facility];  Service: Gastroenterology;  Laterality: N/A;   ESOPHAGOGASTRODUODENOSCOPY (EGD) WITH PROPOFOL N/A 08/27/2019   Procedure: ESOPHAGOGASTRODUODENOSCOPY (EGD) WITH PROPOFOL;  Surgeon: Malissa Hippo, MD;  [facility];  Service: Endoscopy;  Laterality: N/A;  10:10   ESOPHAGOGASTRODUODENOSCOPY (EGD) WITH PROPOFOL N/A 04/03/2022   Procedure: ESOPHAGOGASTRODUODENOSCOPY (EGD) WITH PROPOFOL;  Surgeon: Dolores Frame, MD;  [facility];  Service: Gastroenterology;  Laterality: N/A;   ESOPHAGOGASTRODUODENOSCOPY (EGD) WITH PROPOFOL N/A 02/16/2023   Procedure: ESOPHAGOGASTRODUODENOSCOPY (EGD) WITH PROPOFOL;  Surgeon: Dolores Frame, MD;  [facility];  Service: Gastroenterology;  Laterality: N/A;   ESOPHAGOGASTRODUODENOSCOPY (EGD) WITH PROPOFOL N/A 04/21/2023   Procedure: ESOPHAGOGASTRODUODENOSCOPY (EGD) WITH PROPOFOL;  Surgeon: Lanelle Bal, DO;  [facility];  Service: Endoscopy;  Laterality: N/A;   EYE SURGERY Bilateral    cataract   GIVENS CAPSULE STUDY N/A 04/22/2023   Procedure: GIVENS CAPSULE STUDY;  Surgeon: Dolores Frame, MD;  [facility];  Service: Gastroenterology;  Laterality: N/A;   HEMOSTASIS CLIP PLACEMENT  04/24/2023   Procedure: HEMOSTASIS CLIP PLACEMENT;  Surgeon: Marguerita Merles, Reuel Boom, MD;  [facility];  Service: Gastroenterology;;   HOT HEMOSTASIS  04/03/2022   Procedure: HOT HEMOSTASIS (ARGON PLASMA COAGULATION/BICAP);  Surgeon: Marguerita Merles, Reuel Boom, MD;  [facility];  Service: Gastroenterology;;   HOT HEMOSTASIS  04/24/2023   Procedure: HOT HEMOSTASIS  (ARGON PLASMA COAGULATION/BICAP);  Surgeon: Marguerita Merles, Reuel Boom, MD;  [facility];  Service: Gastroenterology;;   IR FLUORO GUIDE CV LINE RIGHT  05/30/2023   IR US GUIDE VASC ACCESS RIGHT  05/30/2023   POLYPECTOMY  04/03/2022   Procedure: POLYPECTOMY;  Surgeon: Dolores Frame, MD;  [facility];  Service: Gastroenterology;;   POSTERIOR CERVICAL FUSION/FORAMINOTOMY N/A 05/26/2017   Procedure: RIGHT C4-5 FORAMINOTOMY WITH EXCISION OF HERNIATED DISC;  Surgeon: Kerrin Champagne, MD;  Location: [facility] OR;  Service: Orthopedics;  Laterality: N/A;   RADIOACTIVE SEED IMPLANT N/A 01/11/2016   Procedure: RADIOACTIVE SEED IMPLANT/BRACHYTHERAPY IMPLANT;  Surgeon: Marcine Matar, MD;  Location: [facility];  Service: Urology;  Laterality: N/A;   73  seeds implanted no seeds founds in bladder   SCLEROTHERAPY  04/24/2023   Procedure: SCLEROTHERAPY;  Surgeon: Marguerita Merles, Reuel Boom, MD;  [facility];  Service: Gastroenterology;;   SUBMUCOSAL TATTOO INJECTION  04/24/2023   Procedure: SUBMUCOSAL TATTOO INJECTION;  Surgeon: Dolores Frame, MD;  [facility];  Service: Gastroenterology;;   Social History:  reports that he quit smoking about 34 years ago. His smoking use included cigarettes. He started smoking about 64 years ago. He has a 30 pack-year smoking history. He has never been exposed to tobacco smoke. He has never used smokeless tobacco. He reports current alcohol use. He reports that he does not use drugs.  Family / Support Systems Marital Status: Married How Long?: N/A Patient Roles: Spouse Spouse/Significant Other: Jacob Farmer Children: N/A Other Supports: N/A Anticipated Caregiver: Spouse, [name] Ability/Limitations of Caregiver: none Caregiver Availability: 24/7 Family Dynamics: support from spouse  Social History Preferred language: English Religion: Non-Denominational Cultural Background:  n/a Education: n/a Primary school teacher - How often do you need to have someone help you when you read instructions, pamphlets, or other written material from your doctor or pharmacy?: Never Writes: Yes Employment Status: Retired Date Retired/Disabled/Unemployed: n/a Marine scientist Issues: n/a Guardian/Conservator: n/a   Abuse/Neglect Abuse/Neglect Assessment Can Be Completed: Yes Physical Abuse: Denies Verbal Abuse: Denies Sexual Abuse: Denies Exploitation of patient/patient's resources: Denies Self-Neglect: Denies  Patient response to: Social Isolation - How often do you feel lonely or isolated from those around you?: Never  Emotional Status Pt's affect, behavior and adjustment status: Pl pleasant with support at bedside from spouse Recent Psychosocial Issues: coping Psychiatric History: n/a Substance Abuse History: n/a  Patient / Family Perceptions, Expectations & Goals Pt/Family understanding of illness & functional limitations: yes spouse at bedside Premorbid pt/family roles/activities: Independent overall and driving Anticipated changes in roles/activities/participation: spouse able to supervise and assist with roles and tasks at d/c. Pt/family expectations/goals: MOD I  Building surveyor: None Premorbid Home Care/DME Agencies: Other (Comment) ([facility], [facility] and RW) Transportation available at discharge: spouse Is the patient able to respond to transportation needs?: Yes In the past 12 months, has lack of transportation kept you from medical appointments or from getting medications?: No In the past 12 months, has lack of transportation kept you from meetings, work, or from getting things needed for daily living?: No Resource referrals recommended: Neuropsychology  Discharge Planning Living Arrangements: Spouse/significant other Support Systems:  Spouse/significant other Type of Residence: Private residence [employer] Resources: Marine scientist (specify) Multimedia programmer Medicare) Financial Resources: Social Security Financial Screen Referred: No Living Expenses: Own Money Management: Spouse Does the patient have any problems obtaining your medications?: No Home Management: Independent prior Patient/Family Preliminary Plans: Spouse able to assis with tasks if needed Care Coordinator Anticipated Follow Up Needs: HH/OP Expected length of stay: 12-14 Days  Clinical Impression SW met with patient and spouse on 10/2, introduced self and explained role. Patient anticipates discharging home with spouse to assist. No additional questions or concerns currently.   Andria Rhein 09/18/2023, 9:17 AM

## 2023-09-18 NOTE — Progress Notes (Signed)
Physical Therapy Session Note  Patient Details  Name: Jacob Farmer MRN: [id number] Date of Birth: 12/05/1946  Today's Date: 09/18/2023 PT Individual Time: 0900-0940 PT Individual Time Calculation (min): 40 min   Short Term Goals: Week 1:  PT Short Term Goal 1 (Week 1): Pt will properly utilize TED hose and abdominal binder to assist with orthostatic hypotension. PT Short Term Goal 2 (Week 1): Pt will perform bed mobility with distant supervision and less c/o dizziness. PT Short Term Goal 3 (Week 1): Pt will perform functional standing transfers with overall light CGA. PT Short Term Goal 4 (Week 1): Pt will initiate gait training using LRAD. PT Short Term Goal 5 (Week 1): Pt will initiate stair training with no HR assist using LRAD.  Skilled Therapeutic Interventions/Progress Updates:       Pt in bed to start - no c/o pain. Donned B thigh high TEDs with totalA. No abdominal binder in the room. Orthostatic BP taken with results below. Bed mobility completed with CGA, including supine<>sitting, forward scooting to EOB. Sit<>Stand to RW with CGA from slightly elevated bed height. Able to tolerate standing long enough to get BP with CGA for balance.  BP supine: 147/74 BP sitting: 106/64  BP standing: 81/54  Pt agreeable to resume PT session with caution. Transferred into w/c with CGA/minA for stand pivot transfer. Transported to day room rehab gym and patient assisted onto nustep with similar assist. SetupA provided patient completed x5 minutes at L6 resistance using BLE/BUE- cues for keeping cadence >30 steps/minute but patient having difficulty with this. BP checked during Nustep and it raised some, 105/65.   Returned to his room and rechecked BP sitting in w/c - 136/75 - pt asymptomatic other than feeling slightly fatigued. Pt requesting to lie down in bed - HHA ambulatory transfer (minA) back to bed. Bed mobility completed without assist. All needs met.    Therapy  Documentation Precautions:  Precautions Precautions: Fall Precaution Comments: L hemipareisis, orthostasis Restrictions Weight Bearing Restrictions: No General:       Therapy/Group: Individual Therapy  Orrin Brigham 09/18/2023, 7:58 AM

## 2023-09-18 NOTE — Plan of Care (Signed)
  Problem: RH Balance Goal: LTG Patient will maintain dynamic standing balance (PT) Description: LTG:  Patient will maintain dynamic standing balance with assistance during mobility activities (PT) Flowsheets (Taken 09/17/2023 1640) LTG: Pt will maintain dynamic standing balance during mobility activities with:: Independent with assistive device    Problem: Sit to Stand Goal: LTG:  Patient will perform sit to stand with assistance level (PT) Description: LTG:  Patient will perform sit to stand with assistance level (PT) Flowsheets (Taken 09/17/2023 1640) LTG: PT will perform sit to stand in preparation for functional mobility with assistance level: Independent with assistive device   Problem: RH Bed Mobility Goal: LTG Patient will perform bed mobility with assist (PT) Description: LTG: Patient will perform bed mobility with assistance, with/without cues (PT). Flowsheets (Taken 09/17/2023 1640) LTG: Pt will perform bed mobility with assistance level of: Independent with assistive device    Problem: RH Bed to Chair Transfers Goal: LTG Patient will perform bed/chair transfers w/assist (PT) Description: LTG: Patient will perform bed to chair transfers with assistance (PT). Flowsheets (Taken 09/17/2023 1640) LTG: Pt will perform Bed to Chair Transfers with assistance level: Supervision/Verbal cueing   Problem: RH Car Transfers Goal: LTG Patient will perform car transfers with assist (PT) Description: LTG: Patient will perform car transfers with assistance (PT). Flowsheets (Taken 09/17/2023 1640) LTG: Pt will perform car transfers with assist:: Supervision/Verbal cueing   Problem: RH Furniture Transfers Goal: LTG Patient will perform furniture transfers w/assist (OT/PT) Description: LTG: Patient will perform furniture transfers  with assistance (OT/PT). Flowsheets (Taken 09/17/2023 1640) LTG: Pt will perform furniture transfers with assist:: Supervision/Verbal cueing   Problem: RH  Ambulation Goal: LTG Patient will ambulate in controlled environment (PT) Description: LTG: Patient will ambulate in a controlled environment, # of feet with assistance (PT). Flowsheets (Taken 09/17/2023 1640) LTG: Pt will ambulate in controlled environ  assist needed:: Supervision/Verbal cueing LTG: Ambulation distance in controlled environment: at least 100 ft using LRAD Goal: LTG Patient will ambulate in home environment (PT) Description: LTG: Patient will ambulate in home environment, # of feet with assistance (PT). Flowsheets (Taken 09/17/2023 1640) LTG: Pt will ambulate in home environ  assist needed:: Supervision/Verbal cueing LTG: Ambulation distance in home environment: up to 50 ft per bout using LRAD   Problem: RH Stairs Goal: LTG Patient will ambulate up and down stairs w/assist (PT) Description: LTG: Patient will ambulate up and down # of stairs with assistance (PT) Flowsheets (Taken 09/17/2023 1640) LTG: Pt will ambulate up/down stairs assist needed:: Supervision/Verbal cueing LTG: Pt will  ambulate up and down number of stairs: at least 2 steps using no HR as per home environment with LRAD

## 2023-09-18 NOTE — Progress Notes (Signed)
PROGRESS NOTE   Subjective/Complaints:  Discussed d/c date 10/9 but this is HD day , PT notes therapy limited due to orthostatic hypotension Pt slept poorly due to comp hose, discussed these do not need to be worn in bed  ROS- neg CP, SOB, N/V/D Objective:   No results found. Recent Labs    09/16/23 0614 09/17/23 1549  WBC 9.7 8.6  HGB 9.8* 10.0*  HCT 29.5* 29.9*  PLT 181 197   Recent Labs    09/16/23 0614 09/17/23 1549  NA 132* 132*  K 3.9 3.8  CL 93* 95*  CO2 23 27  GLUCOSE 111* 140*  BUN 79* 44*  CREATININE 10.23* 7.02*  CALCIUM 8.5* 8.5*    Intake/Output Summary (Last 24 hours) at 09/18/2023 0847 Last data filed at 09/17/2023 1836 Gross per 24 hour  Intake --  Output 0 ml  Net 0 ml        Physical Exam: Vital Signs Blood pressure 139/61, pulse 74, temperature 98.3 F (36.8 C), resp. rate 15, height 5\' 10"  (1.778 m), weight (S) 65 kg, SpO2 100%.   General: No acute distress Mood and affect are appropriate Heart: Regular rate and rhythm no rubs murmurs or extra sounds Lungs: Clear to auscultation, breathing unlabored, no rales or wheezes Abdomen: Positive bowel sounds, soft nontender to palpation, nondistended Extremities: No clubbing, cyanosis, or edema Skin: No evidence of breakdown, no evidence of rash Neurologic: Cranial nerves II through XII intact, motor strength is 5/5 in RIght and 4+ Left  deltoid, bicep, tricep, grip, hip flexor, knee extensors, ankle dorsiflexor and plantar flexor  Cerebellar exam normal finger to nose to finger as well as heel to shin in RIght and mild Left  upper and lower extremities Musculoskeletal: Full range of motion in all 4 extremities. No joint swelling   Assessment/Plan: 1. Functional deficits which require 3+ hours per day of interdisciplinary therapy in a comprehensive inpatient rehab setting. Physiatrist is providing close team supervision and 24 hour  management of active medical problems listed below. Physiatrist and rehab team continue to assess barriers to discharge/monitor patient progress toward functional and medical goals  Care Tool:  Bathing    Body parts bathed by patient: Right arm, Left arm, Chest, Abdomen, Front perineal area, Buttocks, Right upper leg, Face, Left lower leg, Right lower leg, Left upper leg         Bathing assist Assist Level: Contact Guard/Touching assist     Upper Body Dressing/Undressing Upper body dressing   What is the patient wearing?: Pull over shirt    Upper body assist Assist Level: Supervision/Verbal cueing    Lower Body Dressing/Undressing Lower body dressing      What is the patient wearing?: Underwear/pull up, Pants     Lower body assist Assist for lower body dressing: Minimal Assistance - Patient > 75%     Toileting Toileting    Toileting assist Assist for toileting: Minimal Assistance - Patient > 75%     Transfers Chair/bed transfer  Transfers assist     Chair/bed transfer assist level: Minimal Assistance - Patient > 75%     Locomotion Ambulation   Ambulation assist   Ambulation activity  did not occur: Safety/medical concerns          Walk 10 feet activity   Assist  Walk 10 feet activity did not occur: Safety/medical concerns        Walk 50 feet activity   Assist Walk 50 feet with 2 turns activity did not occur: Safety/medical concerns         Walk 150 feet activity   Assist Walk 150 feet activity did not occur: Safety/medical concerns         Walk 10 feet on uneven surface  activity   Assist Walk 10 feet on uneven surfaces activity did not occur: Safety/medical concerns         Wheelchair     Assist Is the patient using a wheelchair?: No Type of Wheelchair: Manual Wheelchair activity did not occur: Safety/medical concerns         Wheelchair 50 feet with 2 turns activity    Assist    Wheelchair 50 feet with  2 turns activity did not occur: Safety/medical concerns       Wheelchair 150 feet activity     Assist  Wheelchair 150 feet activity did not occur: Safety/medical concerns       Blood pressure 139/61, pulse 74, temperature 98.3 F (36.8 C), resp. rate 15, height 5\' 10"  (1.778 m), weight (S) 65 kg, SpO2 100%.  Medical Problem List and Plan: 1. 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  Antithrombotics: -DVT/anticoagulation:  Pharmaceutical: Heparin             -antiplatelet therapy: aspirin 81 mg   3. Pain Management: Tylenol as needed   4. Mood/Behavior/Sleep: LCSW to evaluate and provide emotional support             -antipsychotic agents: n/a   5. Neuropsych/cognition: This patient appears capable of making decisions on his own behalf.   6. Skin/Wound Care: Routine skin care checks   7. Fluids/Electrolytes/Nutrition: Routine Is and Os and follow-up chemistries.  Routine labs as per nephrology.             -continue Calcitrol on Monday Wednesday and Fridays             -continue Folvite and Renvela   8: Hypertension: monitor TID and prn             -continue amlodipine 10 mg daily             -continue hydralazine 50 mg BID  Check ortho vitals for hypotension - worse day after HD, will use thigh hi TED hose  Vitals:   09/17/23 1931 09/18/23 0556  BP: (!) 140/77 139/61  Pulse: 81 74  Resp: 16 15  Temp: (!) 96.5 F (35.8 C) 98.3 F (36.8 C)  SpO2: 99% 100%    9: Hyperlipidemia: continue statin   10: History of gout: Continue allopurinol 100 mg daily   11: Diarrhea: C. difficile is negative.  Continue loperamide 2 mg every 8 hours.  Consider probiotics, fiber supplement.   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 ESRD- HD per renal        LOS: 2 days A FACE TO FACE EVALUATION WAS PERFORMED  Erick Colace 09/18/2023, 8:47 AM

## 2023-09-18 NOTE — Progress Notes (Signed)
Had a hard time fallen asleep. Given melatonin to help with sleep. Good effect. Denies pain. CHG bath done this arm. Rt internal jugular cath in place. No drainage observed. Wife in attendance. Safety maintained.

## 2023-09-19 DIAGNOSIS — I6930 Unspecified sequelae of cerebral infarction: Secondary | ICD-10-CM

## 2023-09-19 DIAGNOSIS — I959 Hypotension, unspecified: Secondary | ICD-10-CM

## 2023-09-19 LAB — CBC
HCT: 29 % — ABNORMAL LOW (ref 39.0–52.0)
Hemoglobin: 9.4 g/dL — ABNORMAL LOW (ref 13.0–17.0)
MCH: 28.8 pg (ref 26.0–34.0)
MCHC: 32.4 g/dL (ref 30.0–36.0)
MCV: 89 fL (ref 80.0–100.0)
Platelets: 181 10*3/uL (ref 150–400)
RBC: 3.26 MIL/uL — ABNORMAL LOW (ref 4.22–5.81)
RDW: 14.4 % (ref 11.5–15.5)
WBC: 9.3 10*3/uL (ref 4.0–10.5)
nRBC: 0 % (ref 0.0–0.2)

## 2023-09-19 LAB — RENAL FUNCTION PANEL
Albumin: 2.8 g/dL — ABNORMAL LOW (ref 3.5–5.0)
Anion gap: 16 — ABNORMAL HIGH (ref 5–15)
BUN: 42 mg/dL — ABNORMAL HIGH (ref 8–23)
CO2: 26 mmol/L (ref 22–32)
Calcium: 8.5 mg/dL — ABNORMAL LOW (ref 8.9–10.3)
Chloride: 92 mmol/L — ABNORMAL LOW (ref 98–111)
Creatinine, Ser: 7.67 mg/dL — ABNORMAL HIGH (ref 0.61–1.24)
GFR, Estimated: 7 mL/min — ABNORMAL LOW (ref >60)
Glucose, Bld: 105 mg/dL — ABNORMAL HIGH (ref 70–99)
Phosphorus: 4.7 mg/dL — ABNORMAL HIGH (ref 2.5–4.6)
Potassium: 3.9 mmol/L (ref 3.5–5.1)
Sodium: 134 mmol/L — ABNORMAL LOW (ref 135–145)

## 2023-09-19 LAB — HEPATITIS B SURFACE ANTIBODY, QUANTITATIVE: Hep B S AB Quant (Post): 1258 m[IU]/mL

## 2023-09-19 MED ORDER — TRAZODONE HCL 50 MG PO TABS
50.0000 mg | ORAL_TABLET | Freq: Every evening | ORAL | Status: DC | PRN
  Administered 2023-09-20 – 2023-09-24 (×6): 50 mg via ORAL
  Filled 2023-09-19 (×6): qty 1

## 2023-09-19 MED ORDER — CALCIUM POLYCARBOPHIL 625 MG PO TABS
625.0000 mg | ORAL_TABLET | Freq: Every day | ORAL | Status: DC
  Administered 2023-09-19 – 2023-09-25 (×7): 625 mg via ORAL
  Filled 2023-09-19 (×7): qty 1

## 2023-09-19 MED ORDER — SACCHAROMYCES BOULARDII 250 MG PO CAPS
250.0000 mg | ORAL_CAPSULE | Freq: Two times a day (BID) | ORAL | Status: DC
  Administered 2023-09-19 – 2023-09-25 (×13): 250 mg via ORAL
  Filled 2023-09-19 (×13): qty 1

## 2023-09-19 NOTE — Progress Notes (Signed)
[facility] Progress Note   Subjective:   Observed orthostatics with PT, systolic BP dropped to 70's with standing and patient was symptomatic. Improved quickly once he laid down. Denies CP, SOB, nausea, edema.   Objective Vitals:   09/18/23 0556 09/18/23 1348 09/18/23 1937 09/19/23 0403  BP: 139/61 (!) 150/76 (!) 150/81 (!) 158/78  Pulse: 74 72 72 73  Resp: 15 18 18 18   Temp: 98.3 F (36.8 C) 97.9 F (36.6 C) 98.5 F (36.9 C) 97.7 F (36.5 C)  TempSrc:   Oral Oral  SpO2: 100% 98% 98% 100%  Weight:      Height:       Physical Exam General: Alert male in NAD Heart: RRR, no murmurs, rubs or gallops Lungs: CTA bilaterally Abdomen: Soft, non-distended, +BS Extremities: No edema b/l lower extremities Dialysis Access:  [facility]  Additional Objective Labs: Basic Metabolic Panel: Recent Labs  Lab 09/14/23 0407 09/15/23 0738 09/16/23 0614 09/17/23 1549  NA 133* 134* 132* 132*  K 3.4* 3.9 3.9 3.8  CL 95* 94* 93* 95*  CO2 25 23 23 27   GLUCOSE 131* 107* 111* 140*  BUN 42* 66* 79* 44*  CREATININE 5.58* 8.33* 10.23* 7.02*  CALCIUM 9.1 8.6* 8.5* 8.5*  PHOS 3.9  --   --   --    Liver Function Tests: Recent Labs  Lab 09/14/23 0407  AST 86*  ALT 25  ALKPHOS 77  BILITOT 0.8  PROT 6.3*  ALBUMIN 3.1*   No results for input(s): "LIPASE", "AMYLASE" in the last 168 hours. CBC: Recent Labs  Lab 09/15/23 0738 09/16/23 0614 09/17/23 1549  WBC 13.6* 9.7 8.6  NEUTROABS  --   --  5.6  HGB 9.7* 9.8* 10.0*  HCT 29.6* 29.5* 29.9*  MCV 90.2 87.8 91.4  PLT 175 181 197   Blood Culture No results found for: "SDES", "SPECREQUEST", "CULT", "REPTSTATUS"  Cardiac Enzymes: No results for input(s): "CKTOTAL", "CKMB", "CKMBINDEX", "TROPONINI" in the last 168 hours. CBG: No results for input(s): "GLUCAP" in the last 168 hours. Iron Studies: No results for input(s): "IRON", "TIBC", "TRANSFERRIN", "FERRITIN" in the last 72 hours. @lablastinr3 @ Studies/Results: No  results found. Medications:   allopurinol  100 mg Oral Daily   amLODipine  5 mg Oral QHS   aspirin EC  81 mg Oral Daily   calcitRIOL  0.25 mcg Oral Q M,W,F   Chlorhexidine Gluconate Cloth  6 each Topical Q0600   Chlorhexidine Gluconate Cloth  6 each Topical Q12H   Chlorhexidine Gluconate Cloth  6 each Topical Q0600   feeding supplement  237 mL Oral BID BM   folic acid  0.5 mg Oral Daily   heparin injection (subcutaneous)  5,000 Units Subcutaneous Q8H   leflunomide  10 mg Oral Daily   lidocaine  1 patch Transdermal Q24H   pantoprazole  40 mg Oral BID   rosuvastatin  40 mg Oral Daily   sevelamer carbonate  800 mg Oral BID WC    Dialysis Orders: [facility] in [place]. RIJ TDC for the access.  MWF 4hrs 400/800 2K 2.5 Ca EDW 70.8 Venofer 50mg  q2wks Mircera q2wks Calcitriol 0.38mcg qHD No Heparin    Assessment/Plan: 1. 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 Cardioembolic in setting of A fib most likely per neuro. In CIR. 2. ESRD -on HD MWF.  HD today per regular schedule. NO HEPARIN.  3. Anemia of CKD- Hgb 10. Follow trends, may need to start ESA  if drops below goal again.   4. Secondary hyperparathyroidism - Ca and phos at goal. Continue VDRA and renvela. 5. HTN/volume - Orthostatic hypotension last 2 days during PT.  Permissive hypertension d/t symptomatic orthostasis.  Hydralazine discontinued. Decreased amlodipine to 5mg  and changed to at bedtime schedule.   Does not appear volume overloaded, no UF with HD today, will let volume come up a bit to see if this helps. Also discussed abdominal binder and compression stockings. Consider reducing amlodipine further if no improvement in the next few days 6. Nutrition - On heart healthy diet w/fluid restrictions. Monitor labs  Rogers Blocker, PA-C 09/19/2023, 8:41 AM  [facility] Pager: [phone]

## 2023-09-19 NOTE — Progress Notes (Signed)
PROGRESS NOTE   Subjective/Complaints:  Appreciate nephro note , discussed need to run BP higher with pt and wife  ROS- neg CP, SOB, N/V/D Objective:   No results found. Recent Labs    09/17/23 1549  WBC 8.6  HGB 10.0*  HCT 29.9*  PLT 197   Recent Labs    09/17/23 1549  NA 132*  K 3.8  CL 95*  CO2 27  GLUCOSE 140*  BUN 44*  CREATININE 7.02*  CALCIUM 8.5*    Intake/Output Summary (Last 24 hours) at 09/19/2023 0909 Last data filed at 09/19/2023 0733 Gross per 24 hour  Intake 540 ml  Output --  Net 540 ml        Physical Exam: Vital Signs Blood pressure (!) 158/78, pulse 73, temperature 97.7 F (36.5 C), temperature source Oral, resp. rate 18, height 5\' 10"  (1.778 m), weight (S) 65 kg, SpO2 100%.   General: No acute distress Mood and affect are appropriate Heart: Regular rate and rhythm no rubs murmurs or extra sounds Lungs: Clear to auscultation, breathing unlabored, no rales or wheezes Abdomen: Positive bowel sounds, soft nontender to palpation, nondistended Extremities: No clubbing, cyanosis, or edema Skin: No evidence of breakdown, no evidence of rash Neurologic: Cranial nerves II through XII intact, motor strength is 5/5 in RIght and 4+ Left  deltoid, bicep, tricep, grip, hip flexor, knee extensors, ankle dorsiflexor and plantar flexor  Cerebellar exam normal finger to nose to finger as well as heel to shin in RIght and mild Left  upper and lower extremities Musculoskeletal: Full range of motion in all 4 extremities. No joint swelling   Assessment/Plan: 1. Functional deficits which require 3+ hours per day of interdisciplinary therapy in a comprehensive inpatient rehab setting. Physiatrist is providing close team supervision and 24 hour management of active medical problems listed below. Physiatrist and rehab team continue to assess barriers to discharge/monitor patient progress toward functional  and medical goals  Care Tool:  Bathing    Body parts bathed by patient: Right arm, Left arm, Chest, Abdomen, Front perineal area, Buttocks, Right upper leg, Face, Left lower leg, Right lower leg, Left upper leg         Bathing assist Assist Level: Contact Guard/Touching assist     Upper Body Dressing/Undressing Upper body dressing   What is the patient wearing?: Pull over shirt    Upper body assist Assist Level: Supervision/Verbal cueing    Lower Body Dressing/Undressing Lower body dressing      What is the patient wearing?: Underwear/pull up, Pants     Lower body assist Assist for lower body dressing: Minimal Assistance - Patient > 75%     Toileting Toileting    Toileting assist Assist for toileting: Minimal Assistance - Patient > 75%     Transfers Chair/bed transfer  Transfers assist     Chair/bed transfer assist level: Contact Guard/Touching assist     Locomotion Ambulation   Ambulation assist   Ambulation activity did not occur: Safety/medical concerns          Walk 10 feet activity   Assist  Walk 10 feet activity did not occur: Safety/medical concerns  Assist level: Contact Guard/Touching  assist Assistive device: Walker-rolling   Walk 50 feet activity   Assist Walk 50 feet with 2 turns activity did not occur: Safety/medical concerns  Assist level: Contact Guard/Touching assist Assistive device: Walker-rolling    Walk 150 feet activity   Assist Walk 150 feet activity did not occur: Safety/medical concerns         Walk 10 feet on uneven surface  activity   Assist Walk 10 feet on uneven surfaces activity did not occur: Safety/medical concerns         Wheelchair     Assist Is the patient using a wheelchair?: No Type of Wheelchair: Manual Wheelchair activity did not occur: Safety/medical concerns         Wheelchair 50 feet with 2 turns activity    Assist    Wheelchair 50 feet with 2 turns activity did  not occur: Safety/medical concerns       Wheelchair 150 feet activity     Assist  Wheelchair 150 feet activity did not occur: Safety/medical concerns       Blood pressure (!) 158/78, pulse 73, temperature 97.7 F (36.5 C), temperature source Oral, resp. rate 18, height 5\' 10"  (1.778 m), weight (S) 65 kg, SpO2 100%.  Medical Problem List and Plan: 1. 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  Antithrombotics: -DVT/anticoagulation:  Pharmaceutical: Heparin             -antiplatelet therapy: aspirin 81 mg   3. Pain Management: Tylenol as needed   4. Mood/Behavior/Sleep: LCSW to evaluate and provide emotional support             -antipsychotic agents: n/a   5. Neuropsych/cognition: This patient appears capable of making decisions on his own behalf.   6. Skin/Wound Care: Routine skin care checks   7. Fluids/Electrolytes/Nutrition: Routine Is and Os and follow-up chemistries.  Routine labs as per nephrology.             -continue Calcitrol on Monday Wednesday and Fridays             -continue Folvite and Renvela   8: Hypertension: monitor TID and prn             -continue amlodipine 10 mg daily             -continue hydralazine 50 mg BID  Check ortho vitals for hypotension - worse day after HD, will use thigh hi TED hose waiting for abd binder  Vitals:   09/18/23 1937 09/19/23 0403  BP: (!) 150/81 (!) 158/78  Pulse: 72 73  Resp: 18 18  Temp: 98.5 F (36.9 C) 97.7 F (36.5 C)  SpO2: 98% 100%   Laying or sitting BP will need to run higher per nephro to allow for drop with standing No hypovolemia suspected  9: Hyperlipidemia: continue statin   10: History of gout: Continue allopurinol 100 mg daily   11: Diarrhea: C. difficile is negative.  Continue loperamide 2 mg every 8 hours.  addprobiotics, fiber  supplement.   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 ESRD- HD per renal        LOS: 3 days A FACE TO FACE EVALUATION WAS PERFORMED  Erick Colace 09/19/2023, 9:09 AM

## 2023-09-19 NOTE — Progress Notes (Signed)
Patient ID: Jacob Farmer, male   DOB: 11-Sep-1946, 78 y.o.   MRN: [id number]  No DME or FU recommendations currently.

## 2023-09-19 NOTE — Progress Notes (Addendum)
Physical Therapy Session Note  Patient Details  Name: Jacob Farmer MRN: [id number] Date of Birth: 1946/11/27  Today's Date: 09/19/2023 PT Individual Time: 0907-1005 PT Individual Time Calculation (min): 58 min   Short Term Goals: Week 1:  PT Short Term Goal 1 (Week 1): Pt will properly utilize TED hose and abdominal binder to assist with orthostatic hypotension. PT Short Term Goal 2 (Week 1): Pt will perform bed mobility with distant supervision and less c/o dizziness. PT Short Term Goal 3 (Week 1): Pt will perform functional standing transfers with overall light CGA. PT Short Term Goal 4 (Week 1): Pt will initiate gait training using LRAD. PT Short Term Goal 5 (Week 1): Pt will initiate stair training with no HR assist using LRAD.  Skilled Therapeutic Interventions/Progress Updates: Patient supine in bed with wife present on entrance to room. Patient alert and agreeable to PT session.   Patient reported not getting much sleep the previous night (MD arrived shortly after and made aware). OTA made rehab team aware that pt was orthostatic this morning, and that the abdominal binder that was ordered yesterday has not arrived (RN called and supply said they are sending up another). PTA discussed DME options with pt and wife. Pt's wife stated that they wanted to get a quad base cane and WC (for safety due to pt's decrease in ambulatory endurance and low back pain requiring pt to sit periodically). PTA stated that it will be discussed with primary PT because wife was also wanting a WC that doesn't weigh a lot. Pt supine to sitting EOB with supervision. When pt sitting EOB, pt denied any symptoms of dizziness or lightheadedness (BP monitored). Shortly after, nephrology PA arrived to converse about pt's BP with pt. Pt sitting EOB to standing with supervision to RW (BP monitored). Pt reported no symptoms for the first several seconds, and then shortly began to experience lightheadedness and required to  quickly be placed back in supine with modA and with B LE's elevated (BP monitored). Pt started to turn pallor, but started to feel better shortly after in supine with BP again checked (nephrology PA present at that time). Pt reports not feeling dehydrated and that this problem with BP started after having to do dialysis and has gotten worse since admission to [facility]. Pt performed bed level therex sitting EOB with B LE's. LAQ x 10 with ankle weight of 5lbs. Ankle weight taken off due to reports of pt having L knee pain (more than R) due to arthritis. PTA provided PROM into hip flexion with VC for pt to push into PTA's hand for manual resistance, and to work on HS strength for power up during transfers. Pt reported wanting to sit up in chair, but that he may not be able to tolerate due to low back pain. PTA encouraged pt to sit on EOB, that way pt can lay back down prn. PTA also stated that nursing can assist with transfers, but sitting EOB is safer right now because of BP presentation (pt not symptomatic sitting EOB - attending RN made aware)  Patient sitting EOB at end of session with brakes locked, bed alarm set, and all needs within reach.      Therapy Documentation Precautions:  Precautions Precautions: Fall Precaution Comments: L hemipareisis, orthostasis Restrictions Weight Bearing Restrictions: No  Vitals: 121/71 - sitting EOB 74/? - standing (quickly placed back in supine with B LE's elevated) 136/? - supine in bed shortly after  Therapy/Group: Individual Therapy  Rey Dansby  Sheala Dosh PTA 09/19/2023, 11:48 AM

## 2023-09-19 NOTE — Progress Notes (Signed)
Occupational Therapy Session Note  Patient Details  Name: Jacob Farmer MRN: [id number] Date of Birth: 07-20-1946  Today's Date: 09/19/2023 OT Individual Time: 1100-1200 OT Individual Time Calculation (min): 60 min    Short Term Goals: Week 1:  OT Short Term Goal 1 (Week 1): STGs = LTGs  Skilled Therapeutic Interventions/Progress Updates:    Pt received in wc ready for therapy.  Taken to gym. Pt stated he felt better than this morning and was ready to participate. Pt used RW to step to mat and sat on mat to engage in LUE strengthening exercises with blue theraband in sitting.  He then stood and worked on reaching for cones with L hand and placing them on the mat.  In standing BP, 100/77 but pt not symptomatic.  He agreed to try walking with RW with a w/c follow. Pt able to ambulate 108 ft before feeling tired with CGA.   Pt then worked on B arm strengthening with 4 lb dowel bar.  Pt stated he feels his L arm is much stronger.  Pt is using it well. Pt returned to hallway and then ambulated into his room to his bed with RW.  Pt was able to lay down without A.  Pt participated extremely well.  Bed alarm set and all needs met.   Therapy Documentation Precautions:  Precautions Precautions: Fall Precaution Comments: L hemipareisis, orthostasis Restrictions Weight Bearing Restrictions: No    Pain: Pain Assessment Pain Scale: 0-10 Pain Score: 0-No pain ADL: ADL ADL Comments: CGA overall with self care due to low blood pressure in standing  Therapy/Group: Individual Therapy  Magdelena Kinsella 09/19/2023, 9:07 AM

## 2023-09-19 NOTE — Progress Notes (Signed)
Received patient in bed to unit.    Informed consent signed and in chart.    TX duration: 3 hrs     Transported back to floor  Hand-off given to patient's nurse.   Access used:  rt chest cvc Access issues: n/a  Total UF removed: -0.68ml Medication(s) given: Tylenol 650 mg  Signed ama 30 min early d/t knee pain       Maple Hudson, RN Dialysis Unit

## 2023-09-19 NOTE — IPOC Note (Signed)
Overall Plan of Care [facility]) Patient Details Name: Jacob Farmer MRN: [id number] DOB: 1946/08/28  Admitting Diagnosis: Parietal lobe infarction [facility])  Hospital Problems: Principal Problem:   Parietal lobe infarction [facility])     Functional Problem List: Nursing Bowel, Medication Management, Endurance, Safety  PT Balance, Endurance, Motor, Safety, Perception  OT Balance, Endurance, Motor, Sensory  SLP    TR         Basic ADL's: OT Bathing, Dressing, Toileting     Advanced  ADL's: OT       Transfers: PT Bed Mobility, Bed to Chair, Car, Oncologist: PT Ambulation, Stairs     Additional Impairments: OT Fuctional Use of Upper Extremity  SLP        TR      Anticipated Outcomes Item Anticipated Outcome  Self Feeding independent  Swallowing      Basic self-care  supervision  Toileting  supervision   Bathroom Transfers supervision  Bowel/Bladder  manage bowel with mod I assist  Transfers  Mod I/ supervision  Locomotion  Mod I/ supervision  Communication     Cognition     Pain  N/A  Safety/Judgment  manage with cues   Therapy Plan: PT Intensity: Minimum of 1-2 x/day ,45 to 90 minutes PT Frequency: 5 out of 7 days PT Duration Estimated Length of Stay: 10-14 days OT Intensity: Minimum of 1-2 x/day, 45 to 90 minutes OT Frequency: 5 out of 7 days OT Duration/Estimated Length of Stay: 7-9 days     Team Interventions: Nursing Interventions Patient/Family Education, Bowel Management, Disease Management/Prevention, Medication Management  PT interventions Ambulation/gait training, Community reintegration, DME/adaptive equipment instruction, Neuromuscular re-education, Stair training, Psychosocial support, UE/LE Strength taining/ROM, Warden/ranger, Discharge planning, Pain management, Skin care/wound management, Therapeutic Activities, UE/LE Coordination activities, Visual/perceptual remediation/compensation, Therapeutic  Exercise, Splinting/orthotics, Patient/family education, Cognitive remediation/compensation, Disease management/prevention, Functional mobility training  OT Interventions Balance/vestibular training, DME/adaptive equipment instruction, Discharge planning, Functional mobility training, Neuromuscular re-education, Psychosocial support, Patient/family education, Pain management, Self Care/advanced ADL retraining, UE/LE Strength taining/ROM, Therapeutic Exercise, Therapeutic Activities, UE/LE Coordination activities  SLP Interventions    TR Interventions    SW/CM Interventions Discharge Planning, Psychosocial Support, Patient/Family Education, Disease Management/Prevention   Barriers to Discharge MD  Medical stability and Hemodialysis  Nursing Home environment access/layout 2 steps to entry on main level, home with wife Jacob Farmer)  PT Inaccessible home environment, Insurance for SNF coverage, Hemodialysis, Other (comments) (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 The team will be addressing functional mobility, strength, stamina, balance, safety, adaptive techniques and equipment, self-care, bowel and bladder mgt, patient and caregiver education, management of orthostatic hypotension . Goals have been set at Mod I. Anticipated discharge destination is home.        See Team Conference Notes  for weekly updates to the plan of care

## 2023-09-19 NOTE — Progress Notes (Signed)
DId not occur.

## 2023-09-19 NOTE — Plan of Care (Signed)
  Problem: Consults Goal: RH STROKE PATIENT EDUCATION Description: See Patient Education module for education specifics  Outcome: Progressing   Problem: RH BOWEL ELIMINATION Goal: RH STG MANAGE BOWEL WITH ASSISTANCE Description: STG Manage Bowel with mod I Assistance. Outcome: Progressing Goal: RH STG MANAGE BOWEL W/MEDICATION W/ASSISTANCE Description: STG Manage Bowel with Medication with mod I Assistance. Outcome: Progressing   Problem: RH SAFETY Goal: RH STG ADHERE TO SAFETY PRECAUTIONS W/ASSISTANCE/DEVICE Description: STG Adhere to Safety Precautions With cues Assistance/Device. Outcome: Progressing Goal: RH STG DECREASED RISK OF FALL WITH ASSISTANCE Description: STG Decreased Risk of Fall With cues Assistance. Outcome: Progressing   Problem: RH KNOWLEDGE DEFICIT Goal: RH STG INCREASE KNOWLEDGE OF DIABETES Description: Pt and wife manage diabetes with medication and dietary management with educational resources independently  Outcome: Progressing Goal: RH STG INCREASE KNOWLEDGE OF HYPERTENSION Description: Pt and wife manage htn with medication and dietary management with educational resources independently  Outcome: Progressing Goal: RH STG INCREASE KNOWLEGDE OF HYPERLIPIDEMIA Description: Pt and wife manage hld with medication and dietary management with educational resources independently  Outcome: Progressing Goal: RH STG INCREASE KNOWLEDGE OF STROKE PROPHYLAXIS Description: Pt and wife manage stroke prophylaxis with medication and dietary management with educational resources independently  Outcome: Progressing   Problem: Education: Goal: Knowledge of disease or condition will improve Outcome: Progressing Goal: Knowledge of secondary prevention will improve (MUST DOCUMENT ALL) Outcome: Progressing Goal: Knowledge of patient specific risk factors will improve Loraine Leriche N/A or DELETE if not current risk factor) Outcome: Progressing   Problem: Ischemic Stroke/TIA Tissue  Perfusion: Goal: Complications of ischemic stroke/TIA will be minimized Outcome: Progressing   Problem: Coping: Goal: Will verbalize positive feelings about self Outcome: Progressing Goal: Will identify appropriate support needs Outcome: Progressing   Problem: Health Behavior/Discharge Planning: Goal: Ability to manage health-related needs will improve Outcome: Progressing Goal: Goals will be collaboratively established with patient/family Outcome: Progressing   Problem: Self-Care: Goal: Ability to participate in self-care as condition permits will improve Outcome: Progressing Goal: Verbalization of feelings and concerns over difficulty with self-care will improve Outcome: Progressing Goal: Ability to communicate needs accurately will improve Outcome: Progressing   Problem: Nutrition: Goal: Risk of aspiration will decrease Outcome: Progressing Goal: Dietary intake will improve Outcome: Progressing   Problem: Education: Goal: Knowledge of General Education information will improve Description: Including pain rating scale, medication(s)/side effects and non-pharmacologic comfort measures Outcome: Progressing   Problem: Health Behavior/Discharge Planning: Goal: Ability to manage health-related needs will improve Outcome: Progressing   Problem: Clinical Measurements: Goal: Ability to maintain clinical measurements within normal limits will improve Outcome: Progressing Goal: Will remain free from infection Outcome: Progressing Goal: Diagnostic test results will improve Outcome: Progressing Goal: Respiratory complications will improve Outcome: Progressing Goal: Cardiovascular complication will be avoided Outcome: Progressing   Problem: Activity: Goal: Risk for activity intolerance will decrease Outcome: Progressing   Problem: Nutrition: Goal: Adequate nutrition will be maintained Outcome: Progressing   Problem: Coping: Goal: Level of anxiety will  decrease Outcome: Progressing   Problem: Elimination: Goal: Will not experience complications related to bowel motility Outcome: Progressing Goal: Will not experience complications related to urinary retention Outcome: Progressing   Problem: Pain Managment: Goal: General experience of comfort will improve Outcome: Progressing   Problem: Safety: Goal: Ability to remain free from injury will improve Outcome: Progressing   Problem: Skin Integrity: Goal: Risk for impaired skin integrity will decrease Outcome: Progressing

## 2023-09-19 NOTE — Progress Notes (Signed)
Occupational Therapy Session Note  Patient Details  Name: Jacob Farmer MRN: [id number] Date of Birth: 06/28/1946  Today's Date: 09/19/2023 OT Individual Time: 6578-4696 OT Individual Time Calculation (min): 69 min    Short Term Goals: Week 1:  OT Short Term Goal 1 (Week 1): STGs = LTGs  Skilled Therapeutic Interventions/Progress Updates:  Pt greeted supine in bed, pt agreeable to OT intervention.  Session limited by [facility], decreased activity tolerance and generalized deconditioning. Pt required several rest breaks throughout session d/t low activity tolerance, however pt reports this was his baseline.  BPs assessed during session as indicated below:    Supine- 139/75 ( 94) HR 67 Sitting- 11/64( 76) HR 72 Standing-  80/47( 58) HR 79  Returned to supine d/t drop in BP for rest break, with pt then able to return to sitting after rest break.   Transfers/bed mobility: pt completed supine<>sit with CGA. Pt completed sit>stand with RW and CGA. Pt completed stand pivot to w/c with no AD and MINA.    ADLs:   UB dressing:set- up assist to don OH shirt from w/c level LB dressing: CGA to don pants via sit>stand from w/c with no AD Footwear: total A to don socks  Bathing: pt completed bathing at sink with overall MIN A, MIN A needed for standing balance while pt stood to wash LB. Noted L inattention as pt dropping wash cloth in L hand   Ended session with pt supine in bed with all needs within reach and bed alarm activated.                  Therapy Documentation Precautions:  Precautions Precautions: Fall Precaution Comments: L hemipareisis, orthostasis Restrictions Weight Bearing Restrictions: No  Pain: unrated back pain reported throughout session, rest breaks and repositioning provided as needed.    Therapy/Group: Individual Therapy  Barron Schmid 09/19/2023, 12:13 PM

## 2023-09-20 DIAGNOSIS — I10 Essential (primary) hypertension: Secondary | ICD-10-CM | POA: Diagnosis not present

## 2023-09-20 DIAGNOSIS — G4700 Insomnia, unspecified: Secondary | ICD-10-CM | POA: Diagnosis not present

## 2023-09-20 DIAGNOSIS — N186 End stage renal disease: Secondary | ICD-10-CM | POA: Diagnosis not present

## 2023-09-20 DIAGNOSIS — I6389 Other cerebral infarction: Secondary | ICD-10-CM | POA: Diagnosis not present

## 2023-09-20 MED ORDER — DARBEPOETIN ALFA 40 MCG/0.4ML IJ SOSY
40.0000 ug | PREFILLED_SYRINGE | INTRAMUSCULAR | Status: DC
  Administered 2023-09-22: 40 ug via SUBCUTANEOUS
  Filled 2023-09-20: qty 0.4

## 2023-09-20 MED ORDER — MELATONIN 5 MG PO TABS: 5.0000 mg | ORAL_TABLET | Freq: Every evening | ORAL | Status: DC | PRN

## 2023-09-20 NOTE — Progress Notes (Signed)
Occupational Therapy Session Note  Patient Details  Name: Jacob Farmer MRN: [id number] Date of Birth: 09/23/1946  Today's Date: 09/20/2023 OT Individual Time: 1052-1200 OT Individual Time Calculation (min): 68 min    Short Term Goals: Week 1:  OT Short Term Goal 1 (Week 1): STGs = LTGs  Skilled Therapeutic Interventions/Progress Updates: patient concurred to participat in OT services this visit "if I am not too weak.  We can try it."            BP supine in bed 169/82 (head of bed elevated 40 degrees and wearing ted hose) with no complaints of weakness, dizziness, or light headedness;   BP seated EOB after transition (No abdominal binder applied while seated):133/78.   Patient preferred to stay seated this session and tolerated sitting EOB to complete core work and L UE neuro re education, including strengthening, dexterity, and coordination.    Patient stated his hand and digital strength has improved since admission.  However, as exhibited during session, he will benefit from continued OT to especially address pinch strength and digital coordination and overhand left hand dexterity.  At end of session he was assisted back in supine position with distant S.  He was able to elevate Head of bed to his preferred level.   Bed alarm was engaged.   He wife rested in his room recliner.  Continue OT POC.     Therapy Documentation Precautions:  Precautions Precautions: Fall Precaution Comments: L hemipareisis, orthostasis Restrictions Weight Bearing Restrictions: No  Pain:denied     Therapy/Group: Individual Therapy  Bud Face [facility] 09/20/2023, 4:19 PM

## 2023-09-20 NOTE — Progress Notes (Signed)
Physical Therapy Session Note  Patient Details  Name: Jacob Farmer MRN: [id number] Date of Birth: 11/14/1946  Today's Date: 09/20/2023 PT Individual Time: 0802-0918 PT Individual Time Calculation (min): 76 min   Short Term Goals: Week 1:  PT Short Term Goal 1 (Week 1): Pt will properly utilize TED hose and abdominal binder to assist with orthostatic hypotension. PT Short Term Goal 2 (Week 1): Pt will perform bed mobility with distant supervision and less c/o dizziness. PT Short Term Goal 3 (Week 1): Pt will perform functional standing transfers with overall light CGA. PT Short Term Goal 4 (Week 1): Pt will initiate gait training using LRAD. PT Short Term Goal 5 (Week 1): Pt will initiate stair training with no HR assist using LRAD.  Skilled Therapeutic Interventions/Progress Updates: Patient supine in bed with wife present on entrance to room. Patient alert and agreeable to PT session.   Patient reported unrated pain in L LE (reported wearing thigh high TED's during dialysis and thinks that is where it came from at the knee - pt instructed that he does not have to wear TED's when laying in bed, only when pt is OOB - reported taking tylenol after dialysis and mid morning).  Pt reported walking from EOB to bathroom with NT this morning prior to therapy with no instances of lightheadedness.  PTA donned thigh high TED hoses and had pt donn the rest up to thigh. PTA at that time talked to charge nurse about patient's abdominal binder that has not been delivered yet. RN contacted ortho supply to have them re-send. Shortly after, abdominal binder was dropped off (10" fit well) and pt supine to sit EOB with supervision/modI. PTA donned binder.  Orthostatics monitored as indicated in vitals with pt requiring multiple rounds of standing tolerance to habituate. Each time pt stood from EOB, pt reported increase in dizziness but in greater time in standing each time. Bed level was elevated to decrease  intraabdominal pressure when standing from a flexed position. Pt then pivoted to WC in RW with no reported symptoms and was dependently transported to ortho gym to work towards ambulating with small base quad cane. Pt stood from [facility] to RW and tolerated close to 2 minutes before needing to sit due to lightheadedness. Pt sat back in [facility] with CGA for safety and then transferred to edge of mat in RW with CGA for safety. Mat elevated and pt stood with supervision to RW and performed standing marches in RW for about two minutes with standing rest break. Pt started to report increase in dizziness and required to sit (symptoms decreased shortly after sitting). Pt transferred back to [facility] in RW with supervision and back to room dependently for time management. Pt encouraged to sit in [facility] to habituate self to upright tolerance for next therapy session (pt in agreeance). Pt also requested to sit on EOB if wanting to during the night. Pt instructed to ensure wife was awake and to assess lightheadedness (lay back in supine if symptoms arise), and to advocate to night nursing about it stating it will help with upright tolerance and pt morale as pt feels like he is "in a cage."  Patient sitting in [facility] with wife present at end of session with brakes locked, chair alarm set, and all needs within reach.      Therapy Documentation Precautions:  Precautions Precautions: Fall Precaution Comments: L hemipareisis, orthostasis Restrictions Weight Bearing Restrictions: No  Vitals: 120/65 sitting EOB 89/59 standing (pt back to supine  with LE's elevated)  108/66 sitting EOB elevated 78/48 standing (pt back to sitting EOB with symptoms not as bad as first trial)  120/72 sitting EOB 94/59 standing (pt requested to sit for a second due to lightheadedness, but quickly felt better)  109/68 sitting EOB - pt then stood to transfer to [facility] in order to move to ortho gym on mat to progress session.  Therapy/Group: Individual  Therapy  Clair Bardwell PTA 09/20/2023, 12:51 PM

## 2023-09-20 NOTE — Progress Notes (Signed)
Occupational Therapy Session Note  Patient Details  Name: Jacob Farmer MRN: [id number] Date of Birth: [birth year]  Today's Date: 09/20/2023 OT Individual Time: 1420-1505 OT Individual Time Calculation (min): 45 min    Short Term Goals: Week 1:  OT Short Term Goal 1 (Week 1): STGs = LTGs  Skilled Therapeutic Interventions/Progress Updates: patient seated in chair in roomand concurred to participate in OT seated in chair with overall focus working on left hand fine motor, dexterity, coordination.   In addition to working midline for bilateral tasks and left hand fine motor, coordination, dexterity tasks, Additionally he preceded work on picking up objects with left UE crossing midline to placed in containers on left.    Wife was present for session.  3 visitors came in at end of session.   Patient was left seated in his w/chair next to his bed.  Continue OT plan of care     Therapy Documentation Precautions:  Precautions Precautions: Fall Precaution Comments: L hemipareisis, orthostasis Restrictions Weight Bearing Restrictions: No   Pain:denied    Therapy/Group: Individual Therapy  Bud Face [facility] 09/20/2023, 4:28 PM

## 2023-09-20 NOTE — Progress Notes (Signed)
Physical Therapy Session Note  Patient Details  Name: Jacob Farmer MRN: [id number] Date of Birth: [birth year]  Today's Date: 09/20/2023 PT Individual Time: 1330-1415 PT Individual Time Calculation (min): 45 min   Short Term Goals: Week 1:  PT Short Term Goal 1 (Week 1): Pt will properly utilize TED hose and abdominal binder to assist with orthostatic hypotension. PT Short Term Goal 2 (Week 1): Pt will perform bed mobility with distant supervision and less c/o dizziness. PT Short Term Goal 3 (Week 1): Pt will perform functional standing transfers with overall light CGA. PT Short Term Goal 4 (Week 1): Pt will initiate gait training using LRAD. PT Short Term Goal 5 (Week 1): Pt will initiate stair training with no HR assist using LRAD.  Skilled Therapeutic Interventions/Progress Updates: Patient supine asleep with wife present on entrance to room. Patient alert after name call and agreeable to PT session.   PTA picked up some time with patient due to morning session's presentation (pt with OH making it difficult to practice ambulation with small base quad cane), and pt seems to do better orthostatically in the later mornings and afternoon vs early morning. Pt agreed to PT.   Therapeutic Activity: Bed Mobility: Pt performed supine<>sit on EOB with supervision/modI Transfers: Pt performed sit<>stand transfers throughout session with supervision and to RW or quad base cane.   - Pt stood from EOB (slightly elevated) with supervision and no reports of lightheadedness. Pt ambulated shortly to [facility] and dependently transported to main gym for therapy.  Gait Training:  Pt ambulated roughly 30' in main gym using RW with supervision at first for safety to assess walking tolerance after not being OOB all day and this mornings presentation (with supervision and WC follow for safety by PTA) - pt ambulated roughly 30' in main gym with quad base cane on R UE and demonstrative cues for sequence during gait  cycle. Pt with CGA for safety and mild unsteadiness but no LOB. Pt initially required increased cues for sequence, but eventually found a rhythm.  - Pt them ambulated after a short rest break for 19' with WC follow for safety + 2, and PTA providing close supervision to further assess pt's dynamic standing balance with quad base cane. Pt with narrow BOS and decreased stance time on L LE. Pt also with slight unsteadiness and reported feeling "wobbly" when ambulating but no LOB. Pt ambulated roughly 20' with close supervision and VC to increase stance time on L LE by increasing step length on R LE. Pt provided with rationale to increasing stance time on L LE post stroke.   Patient sitting in WC at end of session with brakes locked, O and all needs within reach.     Therapy Documentation Precautions:  Precautions Precautions: Fall Precaution Comments: L hemipareisis, orthostasis Restrictions Weight Bearing Restrictions: No   Therapy/Group: Individual Therapy  .Alyah Boehning PTA 09/20/2023, 3:28 PM

## 2023-09-20 NOTE — Progress Notes (Signed)
[facility] Progress Note   Subjective:   HD ended a bit early yesterday due to pain. Patient reports he is still having orthostatic hypotension but it is intermittent, is sometimes able to ambulate with no dizziness. Denies SOB, CP, dizziness, abdominal pain, N/V.    Objective Vitals:   09/19/23 1830 09/19/23 1835 09/19/23 2037 09/20/23 0600  BP: (!) 179/92 (!) 178/82 (!) 140/80 (!) 142/64  Pulse:  72 78 68  Resp:  14 18 17   Temp:  98.1 F (36.7 C) 97.7 F (36.5 C) 98 F (36.7 C)  TempSrc:      SpO2:  100% 98% 100%  Weight:      Height:       Physical Exam General: Alert male in NAD Heart: RRR, no murmurs, rubs or gallops Lungs: CTA bilaterally, respirations unlabored on RA Abdomen: Soft, non-distended, +BS Extremities: No edema b/l lower extremities, compression stockings in place Dialysis Access: [facility]  Additional Objective Labs: Basic Metabolic Panel: Recent Labs  Lab 09/14/23 0407 09/15/23 0738 09/16/23 0614 09/17/23 1549 09/19/23 1529  NA 133*   < > 132* 132* 134*  K 3.4*   < > 3.9 3.8 3.9  CL 95*   < > 93* 95* 92*  CO2 25   < > 23 27 26   GLUCOSE 131*   < > 111* 140* 105*  BUN 42*   < > 79* 44* 42*  CREATININE 5.58*   < > 10.23* 7.02* 7.67*  CALCIUM 9.1   < > 8.5* 8.5* 8.5*  PHOS 3.9  --   --   --  4.7*   < > = values in this interval not displayed.   Liver Function Tests: Recent Labs  Lab 09/14/23 0407 09/19/23 1529  AST 86*  --   ALT 25  --   ALKPHOS 77  --   BILITOT 0.8  --   PROT 6.3*  --   ALBUMIN 3.1* 2.8*   No results for input(s): "LIPASE", "AMYLASE" in the last 168 hours. CBC: Recent Labs  Lab 09/15/23 0738 09/16/23 0614 09/17/23 1549 09/19/23 1529  WBC 13.6* 9.7 8.6 9.3  NEUTROABS  --   --  5.6  --   HGB 9.7* 9.8* 10.0* 9.4*  HCT 29.6* 29.5* 29.9* 29.0*  MCV 90.2 87.8 91.4 89.0  PLT 175 181 197 181   Blood Culture No results found for: "SDES", "SPECREQUEST", "CULT", "REPTSTATUS"  Cardiac Enzymes: No results  for input(s): "CKTOTAL", "CKMB", "CKMBINDEX", "TROPONINI" in the last 168 hours. CBG: No results for input(s): "GLUCAP" in the last 168 hours. Iron Studies: No results for input(s): "IRON", "TIBC", "TRANSFERRIN", "FERRITIN" in the last 72 hours. @lablastinr3 @ Studies/Results: No results found. Medications:   allopurinol  100 mg Oral Daily   amLODipine  5 mg Oral QHS   aspirin EC  81 mg Oral Daily   calcitRIOL  0.25 mcg Oral Q M,W,F   Chlorhexidine Gluconate Cloth  6 each Topical Q0600   Chlorhexidine Gluconate Cloth  6 each Topical Q12H   Chlorhexidine Gluconate Cloth  6 each Topical Q0600   feeding supplement  237 mL Oral BID BM   folic acid  0.5 mg Oral Daily   heparin injection (subcutaneous)  5,000 Units Subcutaneous Q8H   leflunomide  10 mg Oral Daily   lidocaine  1 patch Transdermal Q24H   pantoprazole  40 mg Oral BID   polycarbophil  625 mg Oral Daily   rosuvastatin  40 mg Oral Daily   saccharomyces boulardii  250 mg Oral BID   sevelamer carbonate  800 mg Oral BID WC    Dialysis Orders: [facility] in [place]. RIJ TDC for the access.  MWF 4hrs 400/800 2K 2.5 Ca EDW 70.8 Venofer 50mg  q2wks Mircera q2wks Calcitriol 0.29mcg qHD No Heparin    Assessment/Plan: 1. 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 Cardioembolic in setting of A fib most likely per neuro. In CIR. 2. ESRD -on HD MWF.  NO HEPARIN. Minimizing UF with HD due to orthostatic hypotension, see below.  3. Anemia of CKD- Hgb 9.4, will start aranesp.  4. Secondary hyperparathyroidism - Ca and phos at goal. Continue VDRA and renvela. 5. HTN/volume - Orthostatic hypotension during PT.  Permissive hypertension d/t symptomatic orthostasis.  Hydralazine discontinued. Decreased amlodipine to 5mg  and changed to at bedtime schedule.   Does not appear volume overloaded, no UF with HD, will let volume come up a bit to see if this helps. Also discussed abdominal  binder and compression stockings.  6. Nutrition - On heart healthy diet w/fluid restrictions. Monitor labs    Rogers Blocker, PA-C 09/20/2023, 8:27 AM  [facility] Pager: [phone]

## 2023-09-20 NOTE — Progress Notes (Signed)
PROGRESS NOTE   Subjective/Complaints:  Pt doing ok, slept poorly and agreeable with trying melatonin. Also has trazodone PRN to use.  Denies pain currently.  LBM this morning Doesn't make urine anymore.  Denies any other complaints or concerns today.   ROS- neg CP, SOB, N/V/D/C  Objective:   No results found. Recent Labs    09/17/23 1549 09/19/23 1529  WBC 8.6 9.3  HGB 10.0* 9.4*  HCT 29.9* 29.0*  PLT 197 181   Recent Labs    09/17/23 1549 09/19/23 1529  NA 132* 134*  K 3.8 3.9  CL 95* 92*  CO2 27 26  GLUCOSE 140* 105*  BUN 44* 42*  CREATININE 7.02* 7.67*  CALCIUM 8.5* 8.5*    Intake/Output Summary (Last 24 hours) at 09/20/2023 0818 Last data filed at 09/20/2023 0802 Gross per 24 hour  Intake 477 ml  Output --  Net 477 ml        Physical Exam: Vital Signs Blood pressure (!) 142/64, pulse 68, temperature 98 F (36.7 C), resp. rate 17, height 5\' 10"  (1.778 m), weight 67.3 kg, SpO2 100%.   General: No acute distress, sitting in w/c Mood and affect are appropriate Heart: Regular rate, IRIR rhythm?, no m/r/g appreciated Lungs: Clear to auscultation, breathing unlabored, no rales or wheezes Abdomen: Positive bowel sounds, soft nontender to palpation, nondistended Extremities: No clubbing, cyanosis, or edema Skin: No evidence of breakdown, no evidence of rash  PRIOR EXAMS: Neurologic: Cranial nerves II through XII intact, motor strength is 5/5 in RIght and 4+ Left  deltoid, bicep, tricep, grip, hip flexor, knee extensors, ankle dorsiflexor and plantar flexor  Cerebellar exam normal finger to nose to finger as well as heel to shin in RIght and mild Left  upper and lower extremities Musculoskeletal: Full range of motion in all 4 extremities. No joint swelling   Assessment/Plan: 1. Functional deficits which require 3+ hours per day of interdisciplinary therapy in a comprehensive inpatient rehab  setting. Physiatrist is providing close team supervision and 24 hour management of active medical problems listed below. Physiatrist and rehab team continue to assess barriers to discharge/monitor patient progress toward functional and medical goals  Care Tool:  Bathing    Body parts bathed by patient: Right arm, Left arm, Chest, Abdomen, Front perineal area, Buttocks, Right upper leg, Face, Left lower leg, Right lower leg, Left upper leg         Bathing assist Assist Level: Minimal Assistance - Patient > 75%     Upper Body Dressing/Undressing Upper body dressing   What is the patient wearing?: Pull over shirt    Upper body assist Assist Level: Set up assist    Lower Body Dressing/Undressing Lower body dressing      What is the patient wearing?: Underwear/pull up, Pants     Lower body assist Assist for lower body dressing: Contact Guard/Touching assist     Toileting Toileting Toileting Activity did not occur (Clothing management and hygiene only): N/A (no void or bm)  Toileting assist Assist for toileting: Minimal Assistance - Patient > 75%     Transfers Chair/bed transfer  Transfers assist     Chair/bed transfer assist level:  Contact Guard/Touching assist     Locomotion Ambulation   Ambulation assist   Ambulation activity did not occur: Safety/medical concerns          Walk 10 feet activity   Assist  Walk 10 feet activity did not occur: Safety/medical concerns  Assist level: Contact Guard/Touching assist Assistive device: Walker-rolling   Walk 50 feet activity   Assist Walk 50 feet with 2 turns activity did not occur: Safety/medical concerns  Assist level: Contact Guard/Touching assist Assistive device: Walker-rolling    Walk 150 feet activity   Assist Walk 150 feet activity did not occur: Safety/medical concerns         Walk 10 feet on uneven surface  activity   Assist Walk 10 feet on uneven surfaces activity did not occur:  Safety/medical concerns         Wheelchair     Assist Is the patient using a wheelchair?: Yes Type of Wheelchair: Manual Wheelchair activity did not occur: Safety/medical concerns         Wheelchair 50 feet with 2 turns activity    Assist    Wheelchair 50 feet with 2 turns activity did not occur: Safety/medical concerns       Wheelchair 150 feet activity     Assist  Wheelchair 150 feet activity did not occur: Safety/medical concerns       Blood pressure (!) 142/64, pulse 68, temperature 98 F (36.7 C), resp. rate 17, height 5\' 10"  (1.778 m), weight 67.3 kg, SpO2 100%.  Medical Problem List and Plan: 1. Functional deficits secondary to parietal lobe infarct on Right with ICH s/p TNK             -patient may not shower due to HD catheter             -ELOS/Goals: 12-14 days mod I             -Continue CIR   2.  Antithrombotics: -DVT/anticoagulation:  Pharmaceutical: Heparin             -antiplatelet therapy: aspirin 81 mg   3. Pain Management: Tylenol as needed   4. Mood/Behavior/Sleep: LCSW to evaluate and provide emotional support             -antipsychotic agents: n/a   -09/20/23 trouble sleeping, has trazodone PRN, added melatonin PRN as well 5. Neuropsych/cognition: This patient appears capable of making decisions on his own behalf.   6. Skin/Wound Care: Routine skin care checks   7. Fluids/Electrolytes/Nutrition: Routine Is and Os and follow-up chemistries.  Routine labs as per nephrology.             -continue Calcitrol on Monday Wednesday and Fridays             -continue Folvite and Renvela   8: Hypertension: monitor TID and prn             -continue amlodipine 10 mg daily             -continue hydralazine 50 mg BID  Check ortho vitals for hypotension - worse day after HD, will use thigh hi TED hose waiting for abd binder   Laying or sitting BP will need to run higher per nephro to allow for drop with standing No hypovolemia suspected   -09/20/23 BPs ok, monitor Vitals:   09/17/23 1931 09/18/23 0556 09/18/23 1348 09/18/23 1937  BP: (!) 140/77 139/61 (!) 150/76 (!) 150/81   09/19/23 0403 09/19/23 1317 09/19/23 1504 09/19/23 1545  BP: Marland Kitchen)  158/78 (!) 161/83 (!) 165/78 (!) 161/81   09/19/23 1830 09/19/23 1835 09/19/23 2037 09/20/23 0600  BP: (!) 179/92 (!) 178/82 (!) 140/80 (!) 142/64     9: Hyperlipidemia: continue statin   10: History of gout: Continue allopurinol 100 mg daily   11: Diarrhea: C. difficile is negative.  Continue loperamide 2 mg every 8 hours.  addprobiotics, fiber supplement.   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 ESRD- HD per renal        LOS: 4 days A FACE TO FACE EVALUATION WAS PERFORMED  [address] 09/20/2023, 8:18 AM

## 2023-09-20 NOTE — Progress Notes (Signed)
Orthopedic Tech Progress Note Patient Details:  Jacob Farmer 03-13-1946 [id number] Both 10'' and 12'' abdominal binders were delivered to bedside.  Ortho Devices Type of Ortho Device: Abdominal binder Ortho Device/Splint Interventions: Ordered      Natara Monfort E Braden Cimo 09/20/2023, 8:29 AM

## 2023-09-21 DIAGNOSIS — I10 Essential (primary) hypertension: Secondary | ICD-10-CM | POA: Diagnosis not present

## 2023-09-21 DIAGNOSIS — G4700 Insomnia, unspecified: Secondary | ICD-10-CM | POA: Diagnosis not present

## 2023-09-21 DIAGNOSIS — N186 End stage renal disease: Secondary | ICD-10-CM | POA: Diagnosis not present

## 2023-09-21 DIAGNOSIS — I6389 Other cerebral infarction: Secondary | ICD-10-CM | POA: Diagnosis not present

## 2023-09-21 MED ORDER — CHLORHEXIDINE GLUCONATE CLOTH 2 % EX PADS
6.0000 | MEDICATED_PAD | Freq: Every day | CUTANEOUS | Status: DC
  Administered 2023-09-21: 6 via TOPICAL

## 2023-09-21 NOTE — Progress Notes (Signed)
[facility] Progress Note   Subjective:   BP appears a bit more stable overall, reports he was able to ambulate with PT yesterday with no dizziness. Denies SOB, CP, dizziness, nausea. No peripheral edema  Objective Vitals:   09/20/23 0600 09/20/23 1648 09/20/23 1821 09/21/23 0520  BP: (!) 142/64 (!) 169/79 (!) 152/69 137/64  Pulse: 68 72 73 69  Resp: 17 16 16 17   Temp: 98 F (36.7 C) 97.7 F (36.5 C) 97.6 F (36.4 C) 98 F (36.7 C)  TempSrc:  Oral Oral   SpO2: 100% 100% 100% 100%  Weight:      Height:       Physical Exam General: Alert male in NAD Heart: RRR, no murmurs, rubs or gallops Lungs: CTA bilaterally, respirations unlabored on RA Abdomen: Soft, non-distended, +BS Extremities: No edema b/l lower extremities Dialysis Access:  [facility]  Additional Objective Labs: Basic Metabolic Panel: Recent Labs  Lab 09/16/23 0614 09/17/23 1549 09/19/23 1529  NA 132* 132* 134*  K 3.9 3.8 3.9  CL 93* 95* 92*  CO2 23 27 26   GLUCOSE 111* 140* 105*  BUN 79* 44* 42*  CREATININE 10.23* 7.02* 7.67*  CALCIUM 8.5* 8.5* 8.5*  PHOS  --   --  4.7*   Liver Function Tests: Recent Labs  Lab 09/19/23 1529  ALBUMIN 2.8*   No results for input(s): "LIPASE", "AMYLASE" in the last 168 hours. CBC: Recent Labs  Lab 09/15/23 0738 09/16/23 0614 09/17/23 1549 09/19/23 1529  WBC 13.6* 9.7 8.6 9.3  NEUTROABS  --   --  5.6  --   HGB 9.7* 9.8* 10.0* 9.4*  HCT 29.6* 29.5* 29.9* 29.0*  MCV 90.2 87.8 91.4 89.0  PLT 175 181 197 181   Blood Culture No results found for: "SDES", "SPECREQUEST", "CULT", "REPTSTATUS"  Cardiac Enzymes: No results for input(s): "CKTOTAL", "CKMB", "CKMBINDEX", "TROPONINI" in the last 168 hours. CBG: No results for input(s): "GLUCAP" in the last 168 hours. Iron Studies: No results for input(s): "IRON", "TIBC", "TRANSFERRIN", "FERRITIN" in the last 72 hours. @lablastinr3 @ Studies/Results: No results found. Medications:   allopurinol  100 mg  Oral Daily   amLODipine  5 mg Oral QHS   aspirin EC  81 mg Oral Daily   calcitRIOL  0.25 mcg Oral Q M,W,F   Chlorhexidine Gluconate Cloth  6 each Topical Q0600   Chlorhexidine Gluconate Cloth  6 each Topical Q12H   Chlorhexidine Gluconate Cloth  6 each Topical Q0600   [START ON 09/22/2023] darbepoetin (ARANESP) injection - DIALYSIS  40 mcg Subcutaneous Q Mon-1800   feeding supplement  237 mL Oral BID BM   folic acid  0.5 mg Oral Daily   heparin injection (subcutaneous)  5,000 Units Subcutaneous Q8H   leflunomide  10 mg Oral Daily   lidocaine  1 patch Transdermal Q24H   pantoprazole  40 mg Oral BID   polycarbophil  625 mg Oral Daily   rosuvastatin  40 mg Oral Daily   saccharomyces boulardii  250 mg Oral BID   sevelamer carbonate  800 mg Oral BID WC    Dialysis Orders: [facility] in [place]. RIJ TDC for the access.  MWF 4hrs 400/800 2K 2.5 Ca EDW 70.8 Venofer 50mg  q2wks Mircera q2wks Calcitriol 0.36mcg qHD No Heparin    Assessment/Plan: 1. 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 Cardioembolic in setting of A fib most likely per neuro. In CIR. 2. ESRD -on HD MWF.  NO HEPARIN.  Minimizing UF with HD due to orthostatic hypotension, see below.  3. Anemia of CKD- Hgb 9.4, will start aranesp.  4. Secondary hyperparathyroidism - Ca and phos at goal. Continue VDRA and renvela. 5. HTN/volume - Orthostatic hypotension during PT.  Permissive mild hypertension d/t symptomatic orthostasis.  Hydralazine discontinued. Decreased amlodipine to 5mg  and changed to at bedtime schedule.   Does not appear volume overloaded, no UF with HD, will let volume come up a bit to see if this helps. Also discussed abdominal binder and compression stockings. Seems to be improving over the past 24 hours.  6. Nutrition - On heart healthy diet w/fluid restrictions. Monitor labs    Rogers Blocker, PA-C 09/21/2023, 9:39 AM  [facility] Pager:  [phone]

## 2023-09-21 NOTE — Progress Notes (Signed)
Patient refused bath today. Made x2 attempts. Wife was present. Notified Street PA.    Tilden Dome, LPN

## 2023-09-21 NOTE — Progress Notes (Signed)
PROGRESS NOTE   Subjective/Complaints:  Jacob Farmer doing well, slept much better last night, used trazodone. Also was just extra tired.  Pain well managed.  LBM yesterday.  Doesn't make urine anymore. Dialysis tomorrow.  Denies any other complaints or concerns today.   ROS- neg CP, SOB, N/V/D/C  Objective:   No results found. Recent Labs    09/19/23 1529  WBC 9.3  HGB 9.4*  HCT 29.0*  PLT 181   Recent Labs    09/19/23 1529  NA 134*  K 3.9  CL 92*  CO2 26  GLUCOSE 105*  BUN 42*  CREATININE 7.67*  CALCIUM 8.5*    Intake/Output Summary (Last 24 hours) at 09/21/2023 1011 Last data filed at 09/20/2023 1819 Gross per 24 hour  Intake 720 ml  Output --  Net 720 ml        Physical Exam: Vital Signs Blood pressure 137/64, pulse 69, temperature 98 F (36.7 C), resp. rate 17, height 5\' 10"  (1.778 m), weight 67.3 kg, SpO2 100%.   General: No acute distress, laying in bed Mood and affect are appropriate Heart: Regular rate, IRIR rhythm?, no m/r/g appreciated Lungs: Clear to auscultation, breathing unlabored, no rales or wheezes Abdomen: Positive bowel sounds, soft nontender to palpation, nondistended Extremities: No clubbing or cyanosis. L hand just slightly swollen appearing compared to right; states it's been like this since his stroke.  Skin: No evidence of breakdown, no evidence of rash  PRIOR EXAMS: Neurologic: Cranial nerves II through XII intact, motor strength is 5/5 in RIght and 4+ Left  deltoid, bicep, tricep, grip, hip flexor, knee extensors, ankle dorsiflexor and plantar flexor  Cerebellar exam normal finger to nose to finger as well as heel to shin in RIght and mild Left  upper and lower extremities Musculoskeletal: Full range of motion in all 4 extremities. No joint swelling   Assessment/Plan: 1. Functional deficits which require 3+ hours per day of interdisciplinary therapy in a comprehensive  inpatient rehab setting. Physiatrist is providing close team supervision and 24 hour management of active medical problems listed below. Physiatrist and rehab team continue to assess barriers to discharge/monitor patient progress toward functional and medical goals  Care Tool:  Bathing    Body parts bathed by patient: Right arm, Left arm, Chest, Abdomen, Front perineal area, Buttocks, Right upper leg, Face, Left lower leg, Right lower leg, Left upper leg         Bathing assist Assist Level: Minimal Assistance - Patient > 75%     Upper Body Dressing/Undressing Upper body dressing   What is the patient wearing?: Pull over shirt    Upper body assist Assist Level: Set up assist    Lower Body Dressing/Undressing Lower body dressing      What is the patient wearing?: Underwear/pull up, Pants     Lower body assist Assist for lower body dressing: Contact Guard/Touching assist     Toileting Toileting Toileting Activity did not occur (Clothing management and hygiene only): N/A (no void or bm)  Toileting assist Assist for toileting: Minimal Assistance - Patient > 75%     Transfers Chair/bed transfer  Transfers assist     Chair/bed transfer assist  level: Contact Guard/Touching assist     Locomotion Ambulation   Ambulation assist   Ambulation activity did not occur: Safety/medical concerns          Walk 10 feet activity   Assist  Walk 10 feet activity did not occur: Safety/medical concerns  Assist level: Contact Guard/Touching assist Assistive device: Walker-rolling   Walk 50 feet activity   Assist Walk 50 feet with 2 turns activity did not occur: Safety/medical concerns  Assist level: Contact Guard/Touching assist Assistive device: Walker-rolling    Walk 150 feet activity   Assist Walk 150 feet activity did not occur: Safety/medical concerns         Walk 10 feet on uneven surface  activity   Assist Walk 10 feet on uneven surfaces activity  did not occur: Safety/medical concerns         Wheelchair     Assist Is the patient using a wheelchair?: Yes Type of Wheelchair: Manual Wheelchair activity did not occur: Safety/medical concerns         Wheelchair 50 feet with 2 turns activity    Assist    Wheelchair 50 feet with 2 turns activity did not occur: Safety/medical concerns       Wheelchair 150 feet activity     Assist  Wheelchair 150 feet activity did not occur: Safety/medical concerns       Blood pressure 137/64, pulse 69, temperature 98 F (36.7 C), resp. rate 17, height 5\' 10"  (1.778 m), weight 67.3 kg, SpO2 100%.  Medical Problem List and Plan: 1. Functional deficits secondary to parietal lobe infarct on Right with ICH s/p TNK             -patient may not shower due to HD catheter             -ELOS/Goals: 12-14 days mod I             -Continue CIR   2.  Antithrombotics: -DVT/anticoagulation:  Pharmaceutical: Heparin             -antiplatelet therapy: aspirin 81 mg   3. Pain Management: Tylenol as needed   4. Mood/Behavior/Sleep: LCSW to evaluate and provide emotional support             -antipsychotic agents: n/a   -09/20/23 trouble sleeping, has trazodone PRN, added melatonin PRN as well-- trazodone helped 09/21/23 5. Neuropsych/cognition: This patient appears capable of making decisions on his own behalf.   6. Skin/Wound Care: Routine skin care checks   7. Fluids/Electrolytes/Nutrition: Routine Is and Os and follow-up chemistries.  Routine labs as per nephrology.             -continue Calcitrol on Monday Wednesday and Fridays             -continue Folvite and Renvela   8: Hypertension: monitor TID and prn             -continue amlodipine 10 mg daily>> down to 5mg  QHS             -continue hydralazine 50 mg BID>>d/c'd  Check ortho vitals for hypotension - worse day after HD, will use thigh hi TED hose waiting for abd binder   Laying or sitting BP will need to run higher per nephro  to allow for drop with standing No hypovolemia suspected  -09/21/23 BPs a bit better, cont regimen Vitals:   09/18/23 1937 09/19/23 0403 09/19/23 1317 09/19/23 1504  BP: (!) 150/81 (!) 158/78 (!) 161/83 (!) 165/78  09/19/23 1545 09/19/23 1830 09/19/23 1835 09/19/23 2037  BP: (!) 161/81 (!) 179/92 (!) 178/82 (!) 140/80   09/20/23 0600 09/20/23 1648 09/20/23 1821 09/21/23 0520  BP: (!) 142/64 (!) 169/79 (!) 152/69 137/64     9: Hyperlipidemia: continue statin   10: History of gout: Continue allopurinol 100 mg daily   11: Diarrhea: C. difficile is negative.  Continue loperamide 2 mg every 8 hours.  addprobiotics, fiber supplement.   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 ESRD- HD per renal  15. L hand swelling: Jacob Farmer states it's been like this since his stroke, just marginally larger than R hand; doubt need for work up or intervention, compression glove may help? Defer to weekday team.         LOS: 5 days A FACE TO FACE EVALUATION WAS PERFORMED  [address] 09/21/2023, 10:11 AM

## 2023-09-21 NOTE — Progress Notes (Signed)
Occupational Therapy Session Note  Patient Details  Name: Jacob Farmer MRN: [id number] Date of Birth: 05/25/1946  {CHL IP REHAB OT TIME CALCULATIONS:[id number]}   Short Term Goals: Week 1:  OT Short Term Goal 1 (Week 1): STGs = LTGs  Skilled Therapeutic Interventions/Progress Updates:    Patient agreeable to participate in OT session. Reports *** pain level.   Patient participated in skilled OT session focusing on ***. Therapist facilitated/assessed/developed/educated/integrated/elicited *** in order to improve/facilitate/promote    Therapy Documentation Precautions:  Precautions Precautions: Fall Precaution Comments: L hemipareisis, orthostasis Restrictions Weight Bearing Restrictions: No  Therapy/Group: Individual Therapy  Limmie Patricia, OTR/L,CBIS  Supplemental OT - [facility] and [facility] Secure Chat Preferred   09/21/2023, 9:07 PM

## 2023-09-22 DIAGNOSIS — I6389 Other cerebral infarction: Secondary | ICD-10-CM | POA: Diagnosis not present

## 2023-09-22 MED ORDER — HEPARIN SODIUM (PORCINE) 1000 UNIT/ML IJ SOLN: 3800.0000 [IU] | Freq: Once | INTRAMUSCULAR | Status: DC

## 2023-09-22 MED ORDER — HEPARIN SODIUM (PORCINE) 1000 UNIT/ML IJ SOLN
3800.0000 [IU] | INTRAMUSCULAR | Status: DC
  Administered 2023-09-22: 3800 [IU]
  Filled 2023-09-22: qty 4

## 2023-09-22 MED ORDER — HEPARIN SODIUM (PORCINE) 1000 UNIT/ML IJ SOLN: 3800.0000 [IU] | INTRAMUSCULAR | Status: DC

## 2023-09-22 NOTE — Progress Notes (Signed)
[facility] Progress Note   Subjective:   Patient seen and examined at bedside. Tolerated PT well this AM.  Able to walk from room to gym with a cane without dizziness.  Feeling more confident about discharging later this week.  Denies CP, SOB, abdominal pain and n/v/d.    Objective Vitals:   09/21/23 1345 09/21/23 1945 09/22/23 0522 09/22/23 1412  BP:  (!) 152/64 (!) 144/59 (!) 154/72  Pulse:  65 70 73  Resp:  16 17 18   Temp:  98.6 F (37 C) (!) 97.5 F (36.4 C) 98.5 F (36.9 C)  TempSrc:    Oral  SpO2:  98% 98% 97%  Weight: 69.8 kg     Height:       Physical Exam General:well appearing male in NAD Heart:RRR, no mrg Lungs:CTAB, nml WOB on RA Abdomen:soft, NTND Extremities:no LE edema Dialysis Access: [facility]   Filed Weights   09/17/23 1833 09/19/23 1504 09/21/23 1345  Weight: (S) 65 kg 67.3 kg 69.8 kg    Intake/Output Summary (Last 24 hours) at 09/22/2023 1513 Last data filed at 09/22/2023 1305 Gross per 24 hour  Intake 478 ml  Output --  Net 478 ml    Additional Objective Labs: Basic Metabolic Panel: Recent Labs  Lab 09/16/23 0614 09/17/23 1549 09/19/23 1529  NA 132* 132* 134*  K 3.9 3.8 3.9  CL 93* 95* 92*  CO2 23 27 26   GLUCOSE 111* 140* 105*  BUN 79* 44* 42*  CREATININE 10.23* 7.02* 7.67*  CALCIUM 8.5* 8.5* 8.5*  PHOS  --   --  4.7*   Liver Function Tests: Recent Labs  Lab 09/19/23 1529  ALBUMIN 2.8*   CBC: Recent Labs  Lab 09/16/23 0614 09/17/23 1549 09/19/23 1529  WBC 9.7 8.6 9.3  NEUTROABS  --  5.6  --   HGB 9.8* 10.0* 9.4*  HCT 29.5* 29.9* 29.0*  MCV 87.8 91.4 89.0  PLT 181 197 181    Medications:   allopurinol  100 mg Oral Daily   amLODipine  5 mg Oral QHS   aspirin EC  81 mg Oral Daily   calcitRIOL  0.25 mcg Oral Q M,W,F   Chlorhexidine Gluconate Cloth  6 each Topical Q12H   darbepoetin (ARANESP) injection - DIALYSIS  40 mcg Subcutaneous Q Mon-1800   feeding supplement  237 mL Oral BID BM   folic acid  0.5  mg Oral Daily   heparin injection (subcutaneous)  5,000 Units Subcutaneous Q8H   leflunomide  10 mg Oral Daily   lidocaine  1 patch Transdermal Q24H   pantoprazole  40 mg Oral BID   polycarbophil  625 mg Oral Daily   rosuvastatin  40 mg Oral Daily   saccharomyces boulardii  250 mg Oral BID   sevelamer carbonate  800 mg Oral BID WC    Dialysis Orders: [facility] in [place]. RIJ TDC for the access.  MWF 4hrs 400/800 2K 2.5 Ca EDW 70.8 Venofer 50mg  q2wks Mircera q2wks Calcitriol 0.40mcg qHD No Heparin     Assessment/Plan: 1. 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 Cardioembolic in setting of A fib most likely per neuro. In CIR. 2. ESRD -on HD MWF.  NO HEPARIN. Minimizing UF with HD due to orthostatic hypotension, see below.  3. Anemia of CKD- Hgb 9.4, will start aranesp.  4. Secondary hyperparathyroidism - Ca and phos at goal. Continue VDRA and renvela. 5. HTN/volume - Orthostatic hypotension during PT.  Permissive mild hypertension d/t symptomatic orthostasis.  Hydralazine discontinued. Decreased amlodipine to 5mg  and changed to at bedtime schedule.   Does not appear volume overloaded, no UF with HD, will let volume come up a bit to see if this helps. Also discussed abdominal binder and compression stockings. Seems to be improving over the past 48 hours.  6. Nutrition - On heart healthy diet w/fluid restrictions. Monitor labs 7. Dispo - tentative plan for d/c on 09/25/23.  Virgina Norfolk, PA-C [facility] 09/22/2023,3:13 PM  LOS: 6 days

## 2023-09-22 NOTE — Progress Notes (Addendum)
Patient ID: Jacob Farmer, male   DOB: [birth year], 77 y.o.   MRN: [id number]  NO DME needs. HH referral sent to [facility].   2:03 PM: Patient approved. Orders sent.

## 2023-09-22 NOTE — Progress Notes (Signed)
PROGRESS NOTE   Subjective/Complaints:  Pt reports I woke him up- Doing pretty good- denies issues LBM yesterday Is Anuric.     ROS-  Pt denies SOB, abd pain, CP, N/V/C/D, and vision changes   Objective:   No results found. Recent Labs    09/19/23 1529  WBC 9.3  HGB 9.4*  HCT 29.0*  PLT 181   Recent Labs    09/19/23 1529  NA 134*  K 3.9  CL 92*  CO2 26  GLUCOSE 105*  BUN 42*  CREATININE 7.67*  CALCIUM 8.5*    Intake/Output Summary (Last 24 hours) at 09/22/2023 0831 Last data filed at 09/21/2023 1820 Gross per 24 hour  Intake 840 ml  Output 100 ml  Net 740 ml        Physical Exam: Vital Signs Blood pressure (!) 144/59, pulse 70, temperature (!) 97.5 F (36.4 C), resp. rate 17, height 5\' 10"  (1.778 m), weight 69.8 kg, SpO2 98%.     General: awake, alert, appropriate, supine in bed; wife asleep at bedside;  NAD HENT: conjugate gaze; oropharynx moist CV: regular rate rate controlled no JVD Pulmonary: CTA B/L; no W/R/R- good air movement GI: soft, NT, ND, (+)BS Psychiatric: appropriate but very flat- slightly irritable Neurological: Ox3 Extremities: No clubbing or cyanosis. L hand just slightly swollen appearing compared to right; states it's been like this since his stroke.  Skin: No evidence of breakdown, no evidence of rash  PRIOR EXAMS: Neurologic: Cranial nerves II through XII intact, motor strength is 5/5 in RIght and 4+ Left  deltoid, bicep, tricep, grip, hip flexor, knee extensors, ankle dorsiflexor and plantar flexor  Cerebellar exam normal finger to nose to finger as well as heel to shin in RIght and mild Left  upper and lower extremities Musculoskeletal: Full range of motion in all 4 extremities. No joint swelling   Assessment/Plan: 1. Functional deficits which require 3+ hours per day of interdisciplinary therapy in a comprehensive inpatient rehab setting. Physiatrist is  providing close team supervision and 24 hour management of active medical problems listed below. Physiatrist and rehab team continue to assess barriers to discharge/monitor patient progress toward functional and medical goals  Care Tool:  Bathing    Body parts bathed by patient: Right arm, Left arm, Chest, Abdomen, Front perineal area, Buttocks, Right upper leg, Face, Left lower leg, Right lower leg, Left upper leg         Bathing assist Assist Level: Minimal Assistance - Patient > 75%     Upper Body Dressing/Undressing Upper body dressing   What is the patient wearing?: Pull over shirt    Upper body assist Assist Level: Set up assist    Lower Body Dressing/Undressing Lower body dressing      What is the patient wearing?: Underwear/pull up, Pants     Lower body assist Assist for lower body dressing: Contact Guard/Touching assist     Toileting Toileting Toileting Activity did not occur (Clothing management and hygiene only): N/A (no void or bm)  Toileting assist Assist for toileting: Minimal Assistance - Patient > 75%     Transfers Chair/bed transfer  Transfers assist     Chair/bed transfer  assist level: Contact Guard/Touching assist     Locomotion Ambulation   Ambulation assist   Ambulation activity did not occur: Safety/medical concerns          Walk 10 feet activity   Assist  Walk 10 feet activity did not occur: Safety/medical concerns  Assist level: Contact Guard/Touching assist Assistive device: Walker-rolling   Walk 50 feet activity   Assist Walk 50 feet with 2 turns activity did not occur: Safety/medical concerns  Assist level: Contact Guard/Touching assist Assistive device: Walker-rolling    Walk 150 feet activity   Assist Walk 150 feet activity did not occur: Safety/medical concerns         Walk 10 feet on uneven surface  activity   Assist Walk 10 feet on uneven surfaces activity did not occur: Safety/medical  concerns         Wheelchair     Assist Is the patient using a wheelchair?: Yes Type of Wheelchair: Manual Wheelchair activity did not occur: Safety/medical concerns         Wheelchair 50 feet with 2 turns activity    Assist    Wheelchair 50 feet with 2 turns activity did not occur: Safety/medical concerns       Wheelchair 150 feet activity     Assist  Wheelchair 150 feet activity did not occur: Safety/medical concerns       Blood pressure (!) 144/59, pulse 70, temperature (!) 97.5 F (36.4 C), resp. rate 17, height 5\' 10"  (1.778 m), weight 69.8 kg, SpO2 98%.  Medical Problem List and Plan: 1. Functional deficits secondary to parietal lobe infarct on Right with ICH s/p TNK             -patient may not shower due to HD catheter             -ELOS/Goals: 12-14 days mod I            Con't CIR  2.  Antithrombotics: -DVT/anticoagulation:  Pharmaceutical: Heparin             -antiplatelet therapy: aspirin 81 mg   3. Pain Management: Tylenol as needed   10/7- denies pain- con't regimen 4. Mood/Behavior/Sleep: LCSW to evaluate and provide emotional support             -antipsychotic agents: n/a   -09/20/23 trouble sleeping, has trazodone PRN, added melatonin PRN as well-- trazodone helped 09/21/23  10/7- slept well- con't regimen 5. Neuropsych/cognition: This patient appears capable of making decisions on his own behalf.   6. Skin/Wound Care: Routine skin care checks   7. Fluids/Electrolytes/Nutrition: Routine Is and Os and follow-up chemistries.  Routine labs as per nephrology.             -continue Calcitrol on Monday Wednesday and Fridays             -continue Folvite and Renvela   8: Hypertension: monitor TID and prn             -continue amlodipine 10 mg daily>> down to 5mg  QHS             -continue hydralazine 50 mg BID>>d/c'd  Check ortho vitals for hypotension - worse day after HD, will use thigh hi TED hose waiting for abd binder   Laying or sitting  BP will need to run higher per nephro to allow for drop with standing No hypovolemia suspected  -09/21/23 BPs a bit better, cont regimen 10/7- BP's a little elevated, however orthostatics doing slightly better -  con't regimen Vitals:   09/19/23 1504 09/19/23 1545 09/19/23 1830 09/19/23 1835  BP: (!) 165/78 (!) 161/81 (!) 179/92 (!) 178/82   09/19/23 2037 09/20/23 0600 09/20/23 1648 09/20/23 1821  BP: (!) 140/80 (!) 142/64 (!) 169/79 (!) 152/69   09/21/23 0520 09/21/23 1335 09/21/23 1945 09/22/23 0522  BP: 137/64 139/70 (!) 152/64 (!) 144/59     9: Hyperlipidemia: continue statin   10: History of gout: Continue allopurinol 100 mg daily   11: Diarrhea: C. difficile is negative.  Continue loperamide 2 mg every 8 hours.  addprobiotics, fiber supplement.   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 ESRD- HD per renal  15. L hand swelling: pt states it's been like this since his stroke, just marginally larger than R hand; doubt need for work up or intervention, compression glove may help? Defer to weekday team.    10/7- very mild- don't see to work up         LOS: 6 days A FACE TO FACE EVALUATION WAS PERFORMED  Jacob Farmer 09/22/2023, 8:31 AM

## 2023-09-22 NOTE — Progress Notes (Signed)
Physical Therapy Session Note  Patient Details  Name: Jacob Farmer MRN: [id number] Date of Birth: 30-Apr-1946  Today's Date: 09/22/2023 PT Individual Time: 0808-0905 PT Individual Time Calculation (min): 57 min   Short Term Goals: Week 1:  PT Short Term Goal 1 (Week 1): Pt will properly utilize TED hose and abdominal binder to assist with orthostatic hypotension. PT Short Term Goal 2 (Week 1): Pt will perform bed mobility with distant supervision and less c/o dizziness. PT Short Term Goal 3 (Week 1): Pt will perform functional standing transfers with overall light CGA. PT Short Term Goal 4 (Week 1): Pt will initiate gait training using LRAD. PT Short Term Goal 5 (Week 1): Pt will initiate stair training with no HR assist using LRAD.   Skilled Therapeutic Interventions/Progress Updates:  Patient supine in bed on entrance to room. Patient alert and agreeable to PT session.  LPN and nursing student present in room and providing morning medications.   Patient with no pain complaint at start of session.  Therapeutic Activity: Bed Mobility: TED hose donned in supine with MaxA for time. Pt relates desire to remove hose prior to HD this afternoon as in previous HD appointment, TED hose were donned and after starting treatment, he experienced pain in this L LE. Nursing in HD dept would not remove d/t orders placed for wear in IPR. Msg sent to final OT pf the day to remove TED hose at end of session since orders are to wear when OOB and pt will be in bed during dialysis.   Pt performed supine > sit with supervision/ Mod I. Completes dressing while seated EOB with supervision/ setup.  Transfers: Pt performed sit<>stand transfer at EOB with CGA initially and abdominal binder donned while standing. Relates dizziness and returns to sit for resolution of symptoms. Educated pt and wife on physiology behind temporary need for TED hose and abd binder. Stands again with mild dizziness that improves. Stand  pivot transfers throughout session with close supervision/ CGA.  Gait Training:  Pt ambulated >150 ft using SBQC and close supervision/ CGA. Steady pace with good floor clearance and improving stamina. No dizziness reported. Pt then ambulates 60 ft using no AD to armchair from mat table. CGA provided for safety/ confidence initially and improves to close supervision. Provided vc for equalizing step lengths L to R. Following rest break, pt then able to ambulate back to room >150 ft using SBQC. Relates fatigue ~120 ft but is able to maintain slightly slower pace and reach bed. No dizziness related and states improvement in low back soreness with ability to ambulate longer distances.   Patient supine in bed at end of session with brakes locked, bed alarm set, and all needs within reach.   Therapy Documentation Precautions:  Precautions Precautions: Fall Precaution Comments: L hemipareisis, orthostasis Restrictions Weight Bearing Restrictions: No  Pain: Pain Assessment Pain Scale: 0-10 Pain Score: 0-No pain related this session with improvement in low back soreness with mobility.  Therapy/Group: Individual Therapy  Loel Dubonnet PT, DPT, CSRS 09/22/2023, 5:32 PM

## 2023-09-22 NOTE — Patient Instructions (Signed)
Sensory deficits - exercises and advice  Loss of light touch When doing your exercises, start with larger objects, such as pegs or keys then progress to smaller fiddly objects such as washers, screws, coins, buttons, paperclips, cotton buds, as your sensation improves. 1) Use a piece of tissue paper. Drag it slowly and lightly across the affected arm paying particular attention to the hand. Can you feel it? Get someone else to move the paper across your arm and hand. With your eyes closed, can you tell where it is? You can do this with various different materials and objects. Try to keep focused on this for at least 10 minutes at a time. 2) Either do this yourself or have another person touch a spot on your arm with your eyes open, and then with your eyes closed. Try to associate where you saw the object touch your skin to how it felt on your skin. 3) Have another person press firmly onto a point on your arm/hand. Then have them move their finger around, maintaining firm pressure. Watch and pay attention how it feels. Close your eyes and try to identify whether their finger is still or moving. You can do this exercise using vibration such as an Licensed conveyancer. 4) If the skin feels numb all the time try gently stroking your arm for a few minutes a day with a variety of textures for example a sponge, a towel, a soft brush, a wool scarf.

## 2023-09-22 NOTE — Progress Notes (Signed)
[facility] advised last week that pt will likely d/c on 10/10 and resume on 10/11. Will assist as needed.   Olivia Canter Renal Navigator [phone]

## 2023-09-22 NOTE — Progress Notes (Signed)
Occupational Therapy Session Note  Patient Details  Name: Jacob Farmer MRN: [id number] Date of Birth: March 21, 1946  Today's Date: 09/22/2023 OT Individual Time: 7829-5621 OT Individual Time Calculation (min): 70 min    Short Term Goals: Week 1:  OT Short Term Goal 1 (Week 1): STGs = LTGs  Skilled Therapeutic Interventions/Progress Updates:    Pt received in w/c ready for therapy.  Focus of therapy session on pt's mobility and balance skills during ADLs.  Pt had on thigh high TEDS and abdominal binder, he was able to get up from wc and walk to chair at sink with close S.  Pt doffed all clothing with supervision except for A with TEDS.  Pt completed all steps of bathing with supervision and then donned his underwear/pants. Did not reapply TEDs as pt planned to lay in bed for dialysis.  He wanted to wash hair.  Due to new cath, not able to shower. He used washcloths to wet hair and apply shampoo and scrub.  Had pt stand at sink and bend over so I could rinse shampoo from hair using cup.  Pt then sat down to don shirt, socks and shoes.   Pt c/o back pain and requested to lay down.  Pt walked to bed and sat down.  Did not check vitals as pt had no c/o dizziness.   Pt moved to supine and was able to scoot self up in bed and then positioned into chair position.  Pt engaged in LUE and BUE exercises using blue medium band for shoulders, triceps, upper back exercises.   His wife was present and watching the exercises he can do at home.  She stated she is more comfortable with him going home this week as he is moving more easily.  Pt resting in bed, alarm set and all needs met.      Therapy Documentation Precautions:  Precautions Precautions: Fall Precaution Comments: L hemipareisis, orthostasis Restrictions Weight Bearing Restrictions: No Pain:  Pt develops back pain when sitting up and stated it increased to 8/10 but once laying down in bed pt stated pain decreased  significantly ADL: ADL ADL Comments: supervision overall   Therapy/Group: Individual Therapy  Jarel Cuadra 09/22/2023, 12:38 PM

## 2023-09-22 NOTE — Progress Notes (Deleted)
Patient ID: Jacob Farmer, male   DOB: 1946-05-21, 77 y.o.   MRN: [id number]  OP referral faxed to [facility]

## 2023-09-23 ENCOUNTER — Other Ambulatory Visit: Payer: Medicare HMO

## 2023-09-23 DIAGNOSIS — R600 Localized edema: Secondary | ICD-10-CM

## 2023-09-23 MED ORDER — CHLORHEXIDINE GLUCONATE CLOTH 2 % EX PADS: 6.0000 | MEDICATED_PAD | Freq: Every day | CUTANEOUS | Status: DC

## 2023-09-23 NOTE — Progress Notes (Signed)
Inpatient Rehabilitation Care Coordinator Discharge Note   Patient Details  Name: Jacob Farmer MRN: [id number] Date of Birth: 07-Feb-1946   Discharge location: Home  Length of Stay: 8 Days  Discharge activity level: Sup/Min A  Home/community participation: Spouse  Patient response WU:JWJXBJ Literacy - How often do you need to have someone help you when you read instructions, pamphlets, or other written material from your doctor or pharmacy?: Never  Patient response YN:WGNFAO Isolation - How often do you feel lonely or isolated from those around you?: Never  Services provided included: MD, RD, PT, OT, RN, Pharmacy, TR, CM, Neuropsych, SW  Financial Services:  Field seismologist Utilized: Private [employer]  Choices offered to/list presented to: Pt and Spouse  Follow-up services arranged:  Home Health, DME Home Health Agency: [facility]    DME : NO DME NEEDS    Patient response to transportation need: Is the patient able to respond to transportation needs?: Yes In the past 12 months, has lack of transportation kept you from medical appointments or from getting medications?: No In the past 12 months, has lack of transportation kept you from meetings, work, or from getting things needed for daily living?: No   Patient/Family verbalized understanding of follow-up arrangements:  Yes  Individual responsible for coordination of the follow-up plan: spouse,[phone]  Confirmed correct DME delivered: Andria Rhein 09/23/2023    Comments (or additional information):  Summary of Stay    Date/Time Discharge Planning CSW  09/18/23 0853 Patient anticipates discharging home with spouse to assist. CJB  09/18/23 0844 atient anticipates discharging home with spouse to assist. CJB       Andria Rhein

## 2023-09-23 NOTE — Progress Notes (Signed)
Physical Therapy Session Note  Patient Details  Name: Jacob Farmer MRN: [id number] Date of Birth: 09-Aug-1946  Today's Date: 09/23/2023 PT Individual Time: 1440-1508 PT Individual Time Calculation (min): 28 min   Short Term Goals: Week 1:  PT Short Term Goal 1 (Week 1): Pt will properly utilize TED hose and abdominal binder to assist with orthostatic hypotension. PT Short Term Goal 2 (Week 1): Pt will perform bed mobility with distant supervision and less c/o dizziness. PT Short Term Goal 3 (Week 1): Pt will perform functional standing transfers with overall light CGA. PT Short Term Goal 4 (Week 1): Pt will initiate gait training using LRAD. PT Short Term Goal 5 (Week 1): Pt will initiate stair training with no HR assist using LRAD.  Skilled Therapeutic Interventions/Progress Updates: Patient supine in bed with wife present on entrance to room. Pt alert and agreeable to participate in PT. No pain or complaints reported at beginning of session. Pt stated desire to walk as it is a bigger weakness in pt's presentation (per pt report). Pt supine to sit independently. Pt stood from EOB with supervision/modI to [facility] for safety due to pt's past of OH (no reports noted, but did endorse OH in the morning with therapy that required pt to lay back down in supine, but also reports it is getting better since evaluation). PTA donned abdominal binder dependently for time management (no TED hoses donned this session as pt stated therapy prior initiated standing activities without to assess pt's BP tolerance. Pt also reported that the abdominal binder assists in upright support with low back pain. Pt ambulated from room to ortho gym with CGA and with [facility] and demos good sequence. Pt then performed endurance ambulation trial with WC for safety, and no AD (CGA provided). Pt required increase in focus while ambulating due to distractions causing LOB. Pt had one instance towards end of 271' trial due to increased reports  of pain in low back (chronic) and required to sit. Ambulation performed in order to increase/assess pt's endurance to distance. Pt transported back to room in [facility] and transferred back to EOB via stand pivot and use of HOB railing.   Patient sitting EOB with NT obtaining vitals and wife present at end of session with brakes locked, bed alarm set, and all needs within reach.      Therapy Documentation Precautions:  Precautions Precautions: Fall Precaution Comments: L hemipareisis, orthostasis Restrictions Weight Bearing Restrictions: No  Therapy/Group: Individual Therapy  Oluwadamilola Rosamond PTA 09/23/2023, 3:29 PM

## 2023-09-23 NOTE — Progress Notes (Signed)
[facility] Progress Note   Subjective:   Patient seen and examined at bedside in room.  Reports 1 episode of dizziness this AM in HD when bending over to pick something up.  Otherwise it went well.  Dialysis ok except clotting the system and being there late.  Denies CP, SOB, abdominal pain and n/v/d.     Objective Vitals:   09/22/23 2311 09/23/23 0500 09/23/23 0642 09/23/23 1337  BP: (!) 159/70  138/63   Pulse: 75  76 (!) 50  Resp: 16     Temp: 98.6 F (37 C)  99.1 F (37.3 C)   TempSrc: Oral  Oral   SpO2: 99%  97% (!) 81%  Weight:  69.9 kg    Height:       Physical Exam General:well appearing male in NAD Heart:RRR, no mrg Lungs:CTAB, nml WOB on RA Abdomen:soft, NTND Extremities:no LE edema Dialysis Access: [facility]   Filed Weights   09/21/23 1345 09/22/23 1726 09/23/23 0500  Weight: 69.8 kg 69.8 kg 69.9 kg    Intake/Output Summary (Last 24 hours) at 09/23/2023 1420 Last data filed at 09/22/2023 2116 Gross per 24 hour  Intake --  Output 0 ml  Net 0 ml    Additional Objective Labs: Basic Metabolic Panel: Recent Labs  Lab 09/17/23 1549 09/19/23 1529  NA 132* 134*  K 3.8 3.9  CL 95* 92*  CO2 27 26  GLUCOSE 140* 105*  BUN 44* 42*  CREATININE 7.02* 7.67*  CALCIUM 8.5* 8.5*  PHOS  --  4.7*   Liver Function Tests: Recent Labs  Lab 09/19/23 1529  ALBUMIN 2.8*   CBC: Recent Labs  Lab 09/17/23 1549 09/19/23 1529  WBC 8.6 9.3  NEUTROABS 5.6  --   HGB 10.0* 9.4*  HCT 29.9* 29.0*  MCV 91.4 89.0  PLT 197 181   Medications:   allopurinol  100 mg Oral Daily   amLODipine  5 mg Oral QHS   aspirin EC  81 mg Oral Daily   calcitRIOL  0.25 mcg Oral Q M,W,F   Chlorhexidine Gluconate Cloth  6 each Topical Q12H   darbepoetin (ARANESP) injection - DIALYSIS  40 mcg Subcutaneous Q Mon-1800   feeding supplement  237 mL Oral BID BM   folic acid  0.5 mg Oral Daily   heparin injection (subcutaneous)  5,000 Units Subcutaneous Q8H   leflunomide  10 mg  Oral Daily   lidocaine  1 patch Transdermal Q24H   pantoprazole  40 mg Oral BID   polycarbophil  625 mg Oral Daily   rosuvastatin  40 mg Oral Daily   saccharomyces boulardii  250 mg Oral BID   sevelamer carbonate  800 mg Oral BID WC    Dialysis Orders: [facility] in [place]. RIJ TDC for the access.  MWF 4hrs 400/800 2K 2.5 Ca EDW 70.8 Venofer 50mg  q2wks Mircera q2wks Calcitriol 0.77mcg qHD No Heparin     Assessment/Plan: 1. 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 Cardioembolic in setting of A fib most likely per neuro. In CIR. 2. ESRD -on HD MWF.  NO HEPARIN. Minimizing UF with HD due to orthostatic hypotension, see below.  3. Anemia of CKD- last Hgb 9.4, will start aranesp.  4. Secondary hyperparathyroidism - Ca and phos at goal. Continue VDRA and renvela. 5. HTN/volume - Orthostatic hypotension during PT.  Permissive mild hypertension d/t symptomatic orthostasis.  Hydralazine discontinued. Decreased amlodipine to 5mg  and changed to at bedtime  schedule.   Does not appear volume overloaded, no UF with HD, will let volume come up a bit to see if this helps.  Closer to dry weight now. Also discussed abdominal binder and compression stockings. Seems to be improving over the past 48 hours.  6. Nutrition - On heart healthy diet w/fluid restrictions. Monitor labs 7. Dispo - tentative plan for d/c on 09/25/23.  Virgina Norfolk, PA-C [facility] 09/23/2023,2:20 PM  LOS: 7 days

## 2023-09-23 NOTE — Progress Notes (Signed)
Physical Therapy Session Note  Patient Details  Name: Jacob Farmer MRN: [id number] Date of Birth: Sep 07, 1946  Today's Date: 09/23/2023 PT Individual Time: 7829-5621 PT Individual Time Calculation (min): 56 min   Short Term Goals: Week 1:  PT Short Term Goal 1 (Week 1): Pt will properly utilize TED hose and abdominal binder to assist with orthostatic hypotension. PT Short Term Goal 2 (Week 1): Pt will perform bed mobility with distant supervision and less c/o dizziness. PT Short Term Goal 3 (Week 1): Pt will perform functional standing transfers with overall light CGA. PT Short Term Goal 4 (Week 1): Pt will initiate gait training using LRAD. PT Short Term Goal 5 (Week 1): Pt will initiate stair training with no HR assist using LRAD.  Skilled Therapeutic Interventions/Progress Updates:  Patient supine in bed on entrance to room. Patient alert and agreeable to PT session.   Patient with no pain complaint at start of session. Reports feeling good.   Therapeutic Activity: Bed Mobility: Pt performed supine <> sit with Mod I. Transfers: Pt performed sit<>stand and stand pivot transfers throughout session with close supervision for balance/ dizziness upon standing. Pt requests to sit following initial stance from EOB d/t dizziness. On second stand pt reports improvement in symptoms. Provided minimal verbal cues for technique.  Gait Training:  ambulated 212' x1 to therapy gym using SBQC and CGA. Demonstrated some inability to coordinate cane with step progression but balance is good and no swaying or path deviation. Provided vc/ tc for coordinated step with cane.  Used SBQC to ascend one 4" step with no HR x4 with vc for fist attempt and min cues on subsequent attempts. Completes with heavy CGA initially and light CGA by end of fourth bout.  Ambulated 40' x2 with no AD and good balance, equal time in stance per LE, very smooth movement with equal step length bilaterally.   Following seated  rest, pt is able to return ambulate to room with no AD and light CGA/ supervision over 212 ft. He does demo fatigue around 160 ft but is able to slow pace and complete without stopping.   Discussion with pt and wife that pt will require a transport chair for pt mobility following HD appointments at home d/t decreased strength and increased fatigue following appointments.   Patient supine in bed at end of session with brakes locked, bed alarm set, and all needs within reach.  Therapy Documentation Precautions:  Precautions Precautions: Fall Precaution Comments: L hemipareisis, orthostasis Restrictions Weight Bearing Restrictions: No  Pain:  Mild back pain related following   Therapy/Group: Individual Therapy  Loel Dubonnet PT, DPT, CSRS 09/23/2023, 12:45 PM

## 2023-09-23 NOTE — Plan of Care (Signed)
  Problem: RH SAFETY Goal: RH STG ADHERE TO SAFETY PRECAUTIONS W/ASSISTANCE/DEVICE Description: STG Adhere to Safety Precautions With cues Assistance/Device. Outcome: Progressing   Problem: RH SAFETY Goal: RH STG DECREASED RISK OF FALL WITH ASSISTANCE Description: STG Decreased Risk of Fall With cues Assistance. Outcome: Progressing   Problem: RH KNOWLEDGE DEFICIT Goal: RH STG INCREASE KNOWLEDGE OF DIABETES Description: Pt and wife manage diabetes with medication and dietary management with educational resources independently  Outcome: Progressing   Problem: RH BOWEL ELIMINATION Goal: RH STG MANAGE BOWEL WITH ASSISTANCE Description: STG Manage Bowel with mod I Assistance. Outcome: Progressing   Problem: RH BOWEL ELIMINATION Goal: RH STG MANAGE BOWEL W/MEDICATION W/ASSISTANCE Description: STG Manage Bowel with Medication with mod I Assistance. Outcome: Progressing   Problem: Activity: Goal: Risk for activity intolerance will decrease Outcome: Progressing   Problem: Clinical Measurements: Goal: Will remain free from infection Outcome: Progressing   Problem: Clinical Measurements: Goal: Will remain free from infection Outcome: Progressing   Problem: Clinical Measurements: Goal: Ability to maintain clinical measurements within normal limits will improve Outcome: Progressing

## 2023-09-23 NOTE — Progress Notes (Signed)
Occupational Therapy Session Note  Patient Details  Name: Jacob Farmer MRN: [id number] Date of Birth: [birth year]  Today's Date: 09/23/2023 OT Individual Time: [phone] session 1 OT Individual Time Calculation (min): 56 min  Session 2: 1305-1400   Short Term Goals: Week 1:  OT Short Term Goal 1 (Week 1): STGs = LTGs  Skilled Therapeutic Interventions/Progress Updates:  Session 1: Pt greeted supine in bed, pt agreeable to OT intervention.  No pain reported during session.     Transfers/bed mobility/functional mobility: pt completed all functional mobility with RW and supervision no teds or binder donned today with no s/s of dizziness. Pt completed functional mobility in apt with quad cane with close supervision to recliner, flat HOB and low couch. Pt did endorse short moment of dizziness when transitioning from supine>sit but symptoms resolved with seated rest break.  Pt completed functional ambulation in kitchen with quad cane to challenge dynamic reaching in home setting. Pt completed task with supervision but pt did report dizziness needing to sit down and rest, recommended pt wear teds or binder for the remainder of the day d/t dizziness.   ADLs:  Grooming: pt completed seated grooming at sink with set- up assist.  LB dressing: donned pants from EOB with supervision, stood with RW with supervision to stand to pull pants to waist line.  Footwear: donned shoes from EOB with supervision.   Education: education provided on taking it slow at home, completing short bouts of mobility at home. Discussed equipment needs with pt only needing quad cane if that is the AD team recommends. Discussed IADLs at home, pt reports his wife sets out his clothes and does the cooking. Education provided on making sure pt is breathing during transitional movements to decrease dizziness, discussed also making sure chairs are available at home when pt is transitioning from one room to another.    Ended  session with pt supine in bed with all needs within reach and bed alarm activated.                   Session 2:  Pt greeted supine in bed, pt agreeable to OT intervention.    No pain reported during session, pt completed session with ABD binder donned.   Transfers/bed mobility/functional mobility/therapeutic activity: pt completed supine>sit with supervision. Pt completed all functional mobility with quad cane and CGA. Pt completed functional ambulation outside across uneven surfaces with quad cane and CGA to promote improved global endurance for higher level functional mobility tasks. Pt completed ~ 50 ft total needing seated rest break after ever 10-15 ft.   Pt completed ~ 68ft of functional ambulation with quad cane while holding glass of water to simulate iADL task in home, pt completed task with close supervision with no spillage or LOB noted.   Pt completed functional ambulation from gym> room, greater than a household distance with quad cane with close supervision.   ADLs:   Footwear: pt donned shoes from EOB with set- up assist.    Exercises: pt completed 6 mins total on scifit ( 3 mins of forward propulsion/ 3 mins of backwards propulsion) to improve BUE strength and endurance to promote improved overall activity tolerance.                HR 80 O2 98   Ended session with pt supine in bed with all needs within reach and bed alarm activated.  Therapy Documentation Precautions:  Precautions Precautions: Fall Precaution Comments: L hemipareisis, orthostasis Restrictions Weight Bearing Restrictions: No     Therapy/Group: Individual Therapy  Pollyann Glen [facility] 09/23/2023, 8:34 AM

## 2023-09-23 NOTE — Progress Notes (Signed)
Confirmed with Carlyon Prows, RN HD Cath dressing was changed yesterday 09/22/23.    Tilden Dome, LPN

## 2023-09-23 NOTE — Progress Notes (Signed)
PROGRESS NOTE   Subjective/Complaints:  Discussed BP as well as d/c date   ROS-  Pt denies SOB, abd pain, CP, N/V/C/D, and vision changes   Objective:   No results found. No results for input(s): "WBC", "HGB", "HCT", "PLT" in the last 72 hours.  No results for input(s): "NA", "K", "CL", "CO2", "GLUCOSE", "BUN", "CREATININE", "CALCIUM" in the last 72 hours.   Intake/Output Summary (Last 24 hours) at 09/23/2023 0747 Last data filed at 09/22/2023 2116 Gross per 24 hour  Intake 118 ml  Output 0 ml  Net 118 ml        Physical Exam: Vital Signs Blood pressure 138/63, pulse 76, temperature 99.1 F (37.3 C), temperature source Oral, resp. rate 16, height 5\' 10"  (1.778 m), weight 69.9 kg, SpO2 97%.   General: No acute distress Mood and affect are appropriate Heart: Regular rate and rhythm no rubs murmurs or extra sounds Lungs: Clear to auscultation, breathing unlabored, no rales or wheezes Abdomen: Positive bowel sounds, soft nontender to palpation, nondistended Extremities: No clubbing, cyanosis, or edema Skin: No evidence of breakdown, no evidence of rash   PRIOR EXAMS: Neurologic: Cranial nerves II through XII intact, motor strength is 5/5 in RIght and 4+ Left  deltoid, bicep, tricep, grip, hip flexor, knee extensors, ankle dorsiflexor and plantar flexor  Cerebellar exam normal finger to nose to finger as well as heel to shin in RIght and mild Left  upper and lower extremities Musculoskeletal: Full range of motion in all 4 extremities. No joint swelling   Assessment/Plan: 1. Functional deficits which require 3+ hours per day of interdisciplinary therapy in a comprehensive inpatient rehab setting. Physiatrist is providing close team supervision and 24 hour management of active medical problems listed below. Physiatrist and rehab team continue to assess barriers to discharge/monitor patient progress toward functional  and medical goals  Care Tool:  Bathing    Body parts bathed by patient: Right arm, Left arm, Chest, Abdomen, Front perineal area, Buttocks, Right upper leg, Face, Left lower leg, Right lower leg, Left upper leg         Bathing assist Assist Level: Supervision/Verbal cueing     Upper Body Dressing/Undressing Upper body dressing   What is the patient wearing?: Pull over shirt    Upper body assist Assist Level: Set up assist    Lower Body Dressing/Undressing Lower body dressing      What is the patient wearing?: Underwear/pull up, Pants     Lower body assist Assist for lower body dressing: Supervision/Verbal cueing     Toileting Toileting Toileting Activity did not occur (Clothing management and hygiene only): N/A (no void or bm)  Toileting assist Assist for toileting: Minimal Assistance - Patient > 75%     Transfers Chair/bed transfer  Transfers assist     Chair/bed transfer assist level: Supervision/Verbal cueing     Locomotion Ambulation   Ambulation assist   Ambulation activity did not occur: Safety/medical concerns  Assist level: Supervision/Verbal cueing Assistive device: Cane-quad Max distance: 150 ft   Walk 10 feet activity   Assist  Walk 10 feet activity did not occur: Safety/medical concerns  Assist level: Supervision/Verbal cueing Assistive device: Cane-quad  Walk 50 feet activity   Assist Walk 50 feet with 2 turns activity did not occur: Safety/medical concerns  Assist level: Contact Guard/Touching assist Assistive device: Cane-quad    Walk 150 feet activity   Assist Walk 150 feet activity did not occur: Safety/medical concerns  Assist level: Contact Guard/Touching assist Assistive device: Cane-quad    Walk 10 feet on uneven surface  activity   Assist Walk 10 feet on uneven surfaces activity did not occur: Safety/medical concerns         Wheelchair     Assist Is the patient using a wheelchair?: Yes Type of  Wheelchair: Manual Wheelchair activity did not occur: Safety/medical concerns         Wheelchair 50 feet with 2 turns activity    Assist    Wheelchair 50 feet with 2 turns activity did not occur: Safety/medical concerns       Wheelchair 150 feet activity     Assist  Wheelchair 150 feet activity did not occur: Safety/medical concerns       Blood pressure 138/63, pulse 76, temperature 99.1 F (37.3 C), temperature source Oral, resp. rate 16, height 5\' 10"  (1.778 m), weight 69.9 kg, SpO2 97%.  Medical Problem List and Plan: 1. Functional deficits secondary to parietal lobe infarct on Right with ICH s/p TNK             -patient may not shower due to HD catheter             -ELOS/Goals: 10/10 mod I, team conf in am             Con't CIR  2.  Antithrombotics: -DVT/anticoagulation:  Pharmaceutical: Heparin             -antiplatelet therapy: aspirin 81 mg   3. Pain Management: Tylenol as needed   10/7- denies pain- con't regimen 4. Mood/Behavior/Sleep: LCSW to evaluate and provide emotional support             -antipsychotic agents: n/a   -09/20/23 trouble sleeping, has trazodone PRN, added melatonin PRN as well-- trazodone helped 09/21/23  10/7- slept well- con't regimen 5. Neuropsych/cognition: This patient appears capable of making decisions on his own behalf.   6. Skin/Wound Care: Routine skin care checks   7. Fluids/Electrolytes/Nutrition: Routine Is and Os and follow-up chemistries.  Routine labs as per nephrology.             -continue Calcitrol on Monday Wednesday and Fridays             -continue Folvite and Renvela   8: Hypertension: monitor TID and prn             -continue amlodipine 10 mg daily>> down to 5mg  QHS             -continue hydralazine 50 mg BID>>d/c'd  Check ortho vitals for hypotension - worse day after HD, will use thigh hi TED hose waiting for abd binder   Allowing sys BP to run higher in lying position to allow for orthostatic drops , will  d/c TEDs and Abd binder if pt tolerates  Vitals:   09/22/23 1741 09/22/23 1800 09/22/23 1830 09/22/23 1900  BP: (!) 161/82 (!) 140/74 (!) 152/74 (!) 154/82   09/22/23 1930 09/22/23 1944 09/22/23 2012 09/22/23 2026  BP: (!) 166/80 (!) 183/79 (!) 163/89 (!) 176/79   09/22/23 2056 09/22/23 2116 09/22/23 2311 09/23/23 0642  BP: (!) 170/73 (!) 173/76 (!) 159/70 138/63     9:  Hyperlipidemia: continue statin   10: History of gout: Continue allopurinol 100 mg daily   11: Diarrhea: C. difficile is negative.  Continue loperamide 2 mg every 8 hours.  addprobiotics, fiber supplement.   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 ESRD- HD per renal COnt M-W-F HD managed by Nephro  15. L hand swelling: as expected post CVA with weakness, cont elevation and edema reduction techniques per OT       LOS: 7 days A FACE TO FACE EVALUATION WAS PERFORMED  Erick Colace 09/23/2023, 7:47 AM

## 2023-09-23 NOTE — Progress Notes (Addendum)
Upon arrival this morning, discussed current bath schedule with patient. Patient refused bath today. Per patient he had a really good bath yesterday and his hair was washed. Patient requested to have bath on his next schedule bath day prior to DC on Thursday. Updated assigned OT and MD. Also, made patient aware if he changes his mind to let this nurse know and staff will provide a bath. Wife present in room.   Jacob Dome, LPN

## 2023-09-24 DIAGNOSIS — M549 Dorsalgia, unspecified: Secondary | ICD-10-CM

## 2023-09-24 LAB — RENAL FUNCTION PANEL
Albumin: 2.4 g/dL — ABNORMAL LOW (ref 3.5–5.0)
Anion gap: 14 (ref 5–15)
BUN: 29 mg/dL — ABNORMAL HIGH (ref 8–23)
CO2: 27 mmol/L (ref 22–32)
Calcium: 8.4 mg/dL — ABNORMAL LOW (ref 8.9–10.3)
Chloride: 92 mmol/L — ABNORMAL LOW (ref 98–111)
Creatinine, Ser: 7.8 mg/dL — ABNORMAL HIGH (ref 0.61–1.24)
GFR, Estimated: 7 mL/min — ABNORMAL LOW (ref >60)
Glucose, Bld: 108 mg/dL — ABNORMAL HIGH (ref 70–99)
Phosphorus: 3.8 mg/dL (ref 2.5–4.6)
Potassium: 4.1 mmol/L (ref 3.5–5.1)
Sodium: 133 mmol/L — ABNORMAL LOW (ref 135–145)

## 2023-09-24 LAB — CBC
HCT: 28 % — ABNORMAL LOW (ref 39.0–52.0)
Hemoglobin: 9.2 g/dL — ABNORMAL LOW (ref 13.0–17.0)
MCH: 30.3 pg (ref 26.0–34.0)
MCHC: 32.9 g/dL (ref 30.0–36.0)
MCV: 92.1 fL (ref 80.0–100.0)
Platelets: 205 10*3/uL (ref 150–400)
RBC: 3.04 MIL/uL — ABNORMAL LOW (ref 4.22–5.81)
RDW: 14.5 % (ref 11.5–15.5)
WBC: 10.1 10*3/uL (ref 4.0–10.5)
nRBC: 0 % (ref 0.0–0.2)

## 2023-09-24 MED ORDER — HEPARIN SODIUM (PORCINE) 1000 UNIT/ML DIALYSIS: 1000.0000 [IU] | INTRAMUSCULAR | Status: DC | PRN

## 2023-09-24 MED ORDER — ALTEPLASE 2 MG IJ SOLR: 2.0000 mg | Freq: Once | INTRAMUSCULAR | Status: DC | PRN

## 2023-09-24 MED ORDER — PENTAFLUOROPROP-TETRAFLUOROETH EX AERO: 1.0000 | INHALATION_SPRAY | CUTANEOUS | Status: DC | PRN

## 2023-09-24 MED ORDER — LIDOCAINE-PRILOCAINE 2.5-2.5 % EX CREA: 1.0000 | TOPICAL_CREAM | CUTANEOUS | Status: DC | PRN

## 2023-09-24 MED ORDER — LIDOCAINE HCL (PF) 1 % IJ SOLN: 5.0000 mL | INTRAMUSCULAR | Status: DC | PRN

## 2023-09-24 MED ORDER — LIDOCAINE-PRILOCAINE 2.5-2.5 % EX CREA
1.0000 | TOPICAL_CREAM | CUTANEOUS | Status: DC | PRN
  Filled 2023-09-24: qty 5

## 2023-09-24 MED ORDER — ANTICOAGULANT SODIUM CITRATE 4% (200MG/5ML) IV SOLN: 5.0000 mL | Status: DC | PRN

## 2023-09-24 MED ORDER — ALTEPLASE 2 MG IJ SOLR
2.0000 mg | Freq: Once | INTRAMUSCULAR | Status: DC | PRN
  Filled 2023-09-24: qty 2

## 2023-09-24 MED ORDER — HEPARIN SODIUM (PORCINE) 1000 UNIT/ML DIALYSIS: 20.0000 [IU]/kg | INTRAMUSCULAR | Status: DC | PRN

## 2023-09-24 MED ORDER — HEPARIN SODIUM (PORCINE) 1000 UNIT/ML DIALYSIS
1000.0000 [IU] | INTRAMUSCULAR | Status: DC | PRN
  Filled 2023-09-24: qty 1

## 2023-09-24 MED ORDER — ANTICOAGULANT SODIUM CITRATE 4% (200MG/5ML) IV SOLN
5.0000 mL | Status: DC | PRN
  Filled 2023-09-24: qty 5

## 2023-09-24 MED ORDER — HEPARIN SODIUM (PORCINE) 1000 UNIT/ML IJ SOLN
3800.0000 [IU] | Freq: Once | INTRAMUSCULAR | Status: AC
  Administered 2023-09-24: 3800 [IU] via INTRAVENOUS
  Filled 2023-09-24: qty 4

## 2023-09-24 NOTE — Progress Notes (Signed)
Physical Therapy Session Note  Patient Details  Name: Jacob Farmer MRN: [id number] Date of Birth: 02-Sep-1946  Today's Date: 09/24/2023 PT Individual Time: 0805-0920 PT Individual Time Calculation (min): 75 min   Short Term Goals: Week 1:  PT Short Term Goal 1 (Week 1): Pt will properly utilize TED hose and abdominal binder to assist with orthostatic hypotension. PT Short Term Goal 2 (Week 1): Pt will perform bed mobility with distant supervision and less c/o dizziness. PT Short Term Goal 3 (Week 1): Pt will perform functional standing transfers with overall light CGA. PT Short Term Goal 4 (Week 1): Pt will initiate gait training using LRAD. PT Short Term Goal 5 (Week 1): Pt will initiate stair training with no HR assist using LRAD.  Skilled Therapeutic Interventions/Progress Updates: Patient supine in bed with wife present on entrance to room. Patient alert and agreeable to PT session.   Patient reported no pain this morning.  Therapeutic Activity: Bed Mobility: Pt performed supine<>sit on EOB independently.  Transfers: Pt performed sit<>stand transfers throughout session with no AD or with SBQC (modI).  - Pt requested to self bathe/change into new clothes prior to leaving room. Pt stood from EOB and waited a few seconds prior to moving to assess lightheadedness. Pt light headed and sat back down for a minute, then stood back up and ambulated to chair in front of sink with no issue. Pt self bathed with wash clothe and odor eliminator spray with no assistance required. Pt then doffed old underwear, and donned personal lower body clothing (EOB and stood to don around waist) and shirt with supervision for safety (no issues with lightheadedness, only lower back pain - nsg notified that pt requested lidocaine patch).  - RN request PTA to obtain vitals prior to leaving room. (Recorded).   - Pt transported ortho gym in [facility] for time management and energy conservation for family ed and d/c  activities.  - Pt ambulated 10' without AD and with supervision from [facility] to car with wife present. Pt demos safe car transfer. - Pt transported to main gym in [facility] for energy conservation to practice stairs with wife present. Pt navigated 4 (6") stairs with R HHA (due to pt reported that back entrance has 2 steps and storm door so PTA provided unsteady HHA with pt showing no issue). Pt then reported having 3 steps in front of house with B HR. Pt navigated 4 (6") steps with B HR and with supervision for safety. Pt with step to pattern and no LOB or unsteadiness. Pt wife feels comfortable here in controlled environment with pt in stairs, but unsure about when at home. Pt wife and pt brother will be present to assist pt prn when entering home.  Pt transported back to room for time management in [facility].  Patient sitting in WC at end of session with brakes locked, wife present and all needs within reach.      Therapy Documentation Precautions:  Precautions Precautions: Fall Precaution Comments: occasional orthostasis Restrictions Weight Bearing Restrictions: No  Vitals: Sitting EOB 121/72 (87)  HR - 87 bpm 99 SpO2  Therapy/Group: Individual Therapy  Amaar Oshita PTA 09/24/2023, 12:24 PM

## 2023-09-24 NOTE — Progress Notes (Signed)
Occupational Therapy Discharge Summary  Patient Details  Name: Jacob Farmer MRN: [id number] Date of Birth: December 29, 1945  Date of Discharge from OT service:September 24, 2023   Patient has met 7 of 7 long term goals due to improved activity tolerance, improved balance, postural control, ability to compensate for deficits, functional use of  LEFT upper and LEFT lower extremity, and improved coordination.  Patient to discharge at overall Supervision level.  Patient's care partner is independent to provide the necessary physical assistance at discharge.  Family education completed with spouse and pt.  HEP and supervision handouts provided.  Reasons goals not met: n/a  Recommendation:  No further OT needs at this time.   Equipment: No equipment provided - pt has needed equipment  - pt was given theraband and theraputty.  Reasons for discharge: treatment goals met  Patient/family agrees with progress made and goals achieved: Yes  OT Discharge Precautions/Restrictions  Precautions Precautions: Fall Precaution Comments: occasional orthostasis Restrictions Weight Bearing Restrictions: No  ADL ADL Eating: Independent Grooming: Independent Upper Body Bathing: Setup Lower Body Bathing: Supervision/safety Upper Body Dressing: Independent Lower Body Dressing: Supervision/safety Toileting: Supervision/safety Toilet Transfer: Close supervision Toilet Transfer Method: Proofreader: Grab bars, Raised toilet seat ADL Comments: supervision overall Vision Patient Visual Report: No change from baseline Vision Assessment?: No apparent visual deficits Perception  Perception: Within Functional Limits Praxis Praxis: WFL Cognition Cognition Overall Cognitive Status: Within Functional Limits for tasks assessed Safety/Judgment: Appears intact Brief Interview for Mental Status (BIMS) Repetition of Three Words (First Attempt): 3 Temporal Orientation: Year:  Correct Temporal Orientation: Month: Accurate within 5 days Temporal Orientation: Day: Correct Recall: "Sock": Yes, no cue required Recall: "Blue": Yes, no cue required Recall: "Bed": Yes, no cue required BIMS Summary Score: 15 Sensation Sensation Light Touch: Appears Intact Hot/Cold: Appears Intact Proprioception: Appears Intact Stereognosis: Appears Intact Coordination Gross Motor Movements are Fluid and Coordinated: No Fine Motor Movements are Fluid and Coordinated: Yes Coordination and Movement Description: mild L side hemiparesis Finger Nose Finger Test: [facility]     Balance Static Sitting Balance Static Sitting - Level of Assistance: 7: Independent Dynamic Sitting Balance Dynamic Sitting - Level of Assistance: 7: Independent Static Standing Balance Static Standing - Level of Assistance: 5: Stand by assistance Dynamic Standing Balance Dynamic Standing - Level of Assistance: 5: Stand by assistance Extremity/Trunk Assessment RUE Assessment RUE Assessment: Within Functional Limits LUE Assessment Active Range of Motion (AROM) Comments: WFL General Strength Comments: 4/5   [name] 09/24/2023, 12:21 PM

## 2023-09-24 NOTE — Progress Notes (Signed)
Physical Therapy Discharge Summary  Patient Details  Name: Jacob Farmer MRN: [id number] Date of Birth: 1945-12-19  Date of Discharge from PT service:September 24, 2023   Patient has met 12 of 12 long term goals due to {due UY:4034742}.  Patient to discharge at [facility] level {LOA:3049010}.   Patient's care partner is independent to provide the necessary physical assistance at discharge.  Reasons goals not met: ***  Recommendation:  Patient will benefit from ongoing skilled PT services in {setting:3041680} to continue to advance safe functional mobility, address ongoing impairments in ***, and minimize fall risk.  Equipment: {equipment:3041657}  Reasons for discharge: {Reason for discharge:3049018}  Patient/family agrees with progress made and goals achieved: {Pt/Family agree with progress/goals:3049020}  PT Discharge Precautions/Restrictions Precautions Precautions: Fall Precaution Comments: occasional orthostasis Restrictions Weight Bearing Restrictions: No Vital Signs   Pain Pain Assessment Pain Scale: 0-10 Pain Score: 0-No pain Pain Interference Pain Interference Pain Effect on Sleep: 1. Rarely or not at all Pain Interference with Therapy Activities: 2. Occasionally Pain Interference with Day-to-Day Activities: 1. Rarely or not at all Vision/Perception  Vision - History Ability to See in Adequate Light: 0 Adequate Perception Perception: Within Functional Limits Praxis Praxis: WFL  Cognition Overall Cognitive Status: Within Functional Limits for tasks assessed Arousal/Alertness: Awake/alert Orientation Level: Oriented X4 Safety/Judgment: Appears intact Sensation Sensation Light Touch: Appears Intact Hot/Cold: Appears Intact Proprioception: Appears Intact Stereognosis: Appears Intact Coordination Gross Motor Movements are Fluid and Coordinated: No Fine Motor Movements are Fluid and Coordinated: Yes Coordination and Movement Description: mild L side  hemiparesis Finger Nose Finger Test: [facility] Motor  Motor Motor: Other (comment) (hemiparesis) Motor - Skilled Clinical Observations: mild L hemipareisis but has progressed since evaluation  Mobility Bed Mobility Bed Mobility: Rolling Right;Rolling Left;Supine to Sit;Sit to Supine Rolling Right: Independent Rolling Left: Independent Supine to Sit: Independent Sit to Supine: Independent Transfers Transfers: Sit to Stand;Stand to Sit Sit to Stand: Independent with assistive device Stand to Sit: Independent with assistive device Stand Pivot Transfers: Supervision/Verbal cueing Transfer (Assistive device): Small based quad cane Locomotion  Gait Ambulation: Yes Gait Assistance: Supervision/Verbal cueing Gait Distance (Feet): 250 Feet Assistive device: None Gait Gait: Yes Gait Pattern: Decreased step length - right;Decreased stance time - left;Poor foot clearance - left;Poor foot clearance - right Gait velocity: decreased Stairs / Additional Locomotion Stairs: Yes Stairs Assistance: Supervision/Verbal cueing Stair Management Technique: No rails Number of Stairs: 4 Height of Stairs: 6 Ramp: Supervision/Verbal cueing Curb: Supervision/Verbal cueing Wheelchair Mobility Wheelchair Mobility: No  Trunk/Postural Assessment  Cervical Assessment Cervical Assessment: Exceptions to [facility] (forward head) Thoracic Assessment Thoracic Assessment: Exceptions to [facility] (rounded shoulders) Lumbar Assessment Lumbar Assessment: Within Functional Limits Postural Control Postural Control: Within Functional Limits  Balance Balance Balance Assessed: Yes Static Sitting Balance Static Sitting - Balance Support: Feet supported;No upper extremity supported Static Sitting - Level of Assistance: 7: Independent Dynamic Sitting Balance Dynamic Sitting - Balance Support: Feet supported;Bilateral upper extremity supported Dynamic Sitting - Level of Assistance: 7: Independent Static Standing Balance Static  Standing - Balance Support: During functional activity;No upper extremity supported Static Standing - Level of Assistance: 5: Stand by assistance Dynamic Standing Balance Dynamic Standing - Balance Support: During functional activity;No upper extremity supported Dynamic Standing - Level of Assistance: 5: Stand by assistance Extremity Assessment  RUE Assessment RUE Assessment: Within Functional Limits LUE Assessment Active Range of Motion (AROM) Comments: WFL General Strength Comments: 4/5 RLE Assessment RLE Assessment: Within Functional Limits LLE Assessment LLE Assessment: Within Functional Limits LLE Strength  Left Hip Flexion: 4/5 Left Hip Extension: 4+/5 Left Hip ABduction: 5/5 Left Hip ADduction: 5/5 Left Knee Flexion: 4+/5 Left Knee Extension: 5/5 Left Ankle Dorsiflexion: 4+/5 Left Ankle Plantar Flexion: 5/5 Left Ankle Inversion: 4+/5 Left Ankle Eversion: 4+/5   Emiline Mancebo PTA  09/24/2023, 1:05 PM

## 2023-09-24 NOTE — Progress Notes (Signed)
PROGRESS NOTE   Subjective/Complaints:  Working on steps with PT, discussed that ramp to entry not needed   ROS-  Pt denies SOB, abd pain, CP, N/V/C/D, and vision changes   Objective:   No results found. No results for input(s): "WBC", "HGB", "HCT", "PLT" in the last 72 hours.  No results for input(s): "NA", "K", "CL", "CO2", "GLUCOSE", "BUN", "CREATININE", "CALCIUM" in the last 72 hours.   Intake/Output Summary (Last 24 hours) at 09/24/2023 0933 Last data filed at 09/24/2023 0731 Gross per 24 hour  Intake 474 ml  Output --  Net 474 ml        Physical Exam: Vital Signs Blood pressure (!) 138/55, pulse 66, temperature 98.1 F (36.7 C), resp. rate 18, height 5\' 10"  (1.778 m), weight 69 kg, SpO2 98%.   General: No acute distress Mood and affect are appropriate Heart: Regular rate and rhythm no rubs murmurs or extra sounds Lungs: Clear to auscultation, breathing unlabored, no rales or wheezes Abdomen: Positive bowel sounds, soft nontender to palpation, nondistended Extremities: No clubbing, cyanosis, or edema Skin: No evidence of breakdown, no evidence of rash   PRIOR EXAMS: Neurologic: Cranial nerves II through XII intact, motor strength is 5/5 in RIght and 4+ Left  deltoid, bicep, tricep, grip, hip flexor, knee extensors, ankle dorsiflexor and plantar flexor  Cerebellar exam normal finger to nose to finger as well as heel to shin in RIght and mild Left  upper and lower extremities Musculoskeletal: Full range of motion in all 4 extremities. No joint swelling   Assessment/Plan: 1. Functional deficits which require 3+ hours per day of interdisciplinary therapy in a comprehensive inpatient rehab setting. Physiatrist is providing close team supervision and 24 hour management of active medical problems listed below. Physiatrist and rehab team continue to assess barriers to discharge/monitor patient progress toward  functional and medical goals  Care Tool:  Bathing    Body parts bathed by patient: Right arm, Left arm, Chest, Abdomen, Front perineal area, Buttocks, Right upper leg, Face, Left lower leg, Right lower leg, Left upper leg         Bathing assist Assist Level: Supervision/Verbal cueing     Upper Body Dressing/Undressing Upper body dressing   What is the patient wearing?: Pull over shirt    Upper body assist Assist Level: Set up assist    Lower Body Dressing/Undressing Lower body dressing      What is the patient wearing?: Pants     Lower body assist Assist for lower body dressing: Supervision/Verbal cueing     Toileting Toileting Toileting Activity did not occur (Clothing management and hygiene only): N/A (no void or bm)  Toileting assist Assist for toileting: Minimal Assistance - Patient > 75%     Transfers Chair/bed transfer  Transfers assist     Chair/bed transfer assist level: Supervision/Verbal cueing     Locomotion Ambulation   Ambulation assist   Ambulation activity did not occur: Safety/medical concerns  Assist level: Supervision/Verbal cueing Assistive device: Cane-quad Max distance: 150 ft   Walk 10 feet activity   Assist  Walk 10 feet activity did not occur: Safety/medical concerns  Assist level: Supervision/Verbal cueing Assistive device: Cane-quad  Walk 50 feet activity   Assist Walk 50 feet with 2 turns activity did not occur: Safety/medical concerns  Assist level: Contact Guard/Touching assist Assistive device: Cane-quad    Walk 150 feet activity   Assist Walk 150 feet activity did not occur: Safety/medical concerns  Assist level: Contact Guard/Touching assist Assistive device: Cane-quad    Walk 10 feet on uneven surface  activity   Assist Walk 10 feet on uneven surfaces activity did not occur: Safety/medical concerns         Wheelchair     Assist Is the patient using a wheelchair?: Yes Type of  Wheelchair: Manual Wheelchair activity did not occur: Safety/medical concerns         Wheelchair 50 feet with 2 turns activity    Assist    Wheelchair 50 feet with 2 turns activity did not occur: Safety/medical concerns       Wheelchair 150 feet activity     Assist  Wheelchair 150 feet activity did not occur: Safety/medical concerns       Blood pressure (!) 138/55, pulse 66, temperature 98.1 F (36.7 C), resp. rate 18, height 5\' 10"  (1.778 m), weight 69 kg, SpO2 98%.  Medical Problem List and Plan: 1. 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 Formulized individual treatment plan based on medical history, underlying problem and comorbidities.             Con't CIR  2.  Antithrombotics: -DVT/anticoagulation:  Pharmaceutical: Heparin             -antiplatelet therapy: aspirin 81 mg   3. Pain Management: Tylenol as needed   10/7- denies pain- con't regimen 4. Mood/Behavior/Sleep: LCSW to evaluate and provide emotional support             -antipsychotic agents: n/a   On trazodone and melatonin  5. Neuropsych/cognition: This patient appears capable of making decisions on his own behalf.   6. Skin/Wound Care: Routine skin care checks   7. Fluids/Electrolytes/Nutrition: Routine Is and Os and follow-up chemistries.  Routine labs as per nephrology.             -continue Calcitrol on Monday Wednesday and Fridays             -continue Folvite and Renvela   8: Hypertension: monitor TID and prn             -continue amlodipine 10 mg daily>> down to 5mg  QHS             -continue hydralazine 50 mg BID>>d/c'd  Check ortho vitals for hypotension - worse day after HD, will use thigh hi TED hose waiting for abd  binder   Allowing sys BP to run higher in lying position to allow for orthostatic drops , will d/c TEDs and Abd binder if pt tolerates  Vitals:   09/22/23 1900 09/22/23 1930 09/22/23 1944 09/22/23 2012  BP: (!) 154/82 (!) 166/80 (!) 183/79 (!) 163/89   09/22/23 2026 09/22/23 2056 09/22/23 2116 09/22/23 2311  BP: (!) 176/79 (!) 170/73 (!) 173/76 (!) 159/70   09/23/23 0642 09/23/23 1552 09/23/23 2002 09/24/23 0551  BP: 138/63 Marland Kitchen)  152/74 (!) 152/64 (!) 138/55  Pt still wears abd binder due to back pain not dizziness    9: Hyperlipidemia: continue statin   10: History of gout: Continue allopurinol 100 mg daily   11: Diarrhea: C. difficile is negative.  Continue loperamide 2 mg every 8 hours.  addprobiotics, fiber supplement.   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 ESRD- HD per renal COnt M-W-F HD managed by Nephro  15. L hand swelling: as expected post CVA with weakness, cont elevation and edema reduction techniques per OT       LOS: 8 days A FACE TO FACE EVALUATION WAS PERFORMED  Erick Colace 09/24/2023, 9:33 AM

## 2023-09-24 NOTE — Progress Notes (Signed)
   09/24/23 1739  Vitals  Temp 98 F (36.7 C)  Temp Source Oral  BP (!) 180/82  MAP (mmHg) 110  BP Location Left Wrist  BP Method Automatic  Patient Position (if appropriate) Lying  Pulse Rate 65  Pulse Rate Source Monitor  ECG Heart Rate 66  Resp 14  Oxygen Therapy  SpO2 100 %  O2 Device Room Air  During Treatment Monitoring  HD Safety Checks Performed Yes  Intra-Hemodialysis Comments Tx completed;Tolerated well  Dialysis Fluid Bolus Normal Saline  Bolus Amount (mL) 300 mL  Hemodialysis Catheter Right Internal jugular Double lumen Permanent (Tunneled)  Placement Date/Time: 05/30/23 1447   Serial / Lot #: [id number]  Expiration Date: 07/25/27  Time Out: Correct site;Correct patient;Correct procedure  Maximum sterile barrier precautions: Hand hygiene;Cap;Mask;Sterile gown;Sterile gloves;Large sterile ...  Site Condition No complications  Blue Lumen Status Flushed;Heparin locked;Dead end cap in place  Red Lumen Status Flushed;Heparin locked;Dead end cap in place  Purple Lumen Status N/A  Catheter fill solution Heparin 1000 units/ml  Catheter fill volume (Arterial) 1.9 cc  Catheter fill volume (Venous) 1.9  Dressing Type Transparent  Dressing Status Antimicrobial disc in place;Clean, Dry, Intact  Interventions  (deaccessed)  Drainage Description None  Dressing Change Due 09/29/23  Post treatment catheter status Capped and Clamped

## 2023-09-24 NOTE — Progress Notes (Signed)
[facility] Progress Note   Subjective:   Patient seen and examined in room.  Wearing abdominal binder which he reports helps his back and blood pressure.  Denies CP, SOB, abdominal pain and n/v/d. Plans  for d/c home tomorrow.   Objective Vitals:   09/23/23 1552 09/23/23 2002 09/24/23 0500 09/24/23 0551  BP: (!) 152/74 (!) 152/64  (!) 138/55  Pulse: 64 74  66  Resp: 18 17  18   Temp: 98 F (36.7 C) 97.9 F (36.6 C)  98.1 F (36.7 C)  TempSrc: Oral     SpO2: 100% 99%  98%  Weight:   69 kg   Height:       Physical Exam General:alert male in NAD, sitting in wheelchair Heart:RRR, no mrg Lungs:+rhonchi, nml WOB on RA, no crackles Abdomen:soft, NT, abdominal binder in place Extremities:no LE edema Dialysis Access: [facility]   Filed Weights   09/22/23 1726 09/23/23 0500 09/24/23 0500  Weight: 69.8 kg 69.9 kg 69 kg    Intake/Output Summary (Last 24 hours) at 09/24/2023 1325 Last data filed at 09/24/2023 0731 Gross per 24 hour  Intake 474 ml  Output --  Net 474 ml    Additional Objective Labs: Basic Metabolic Panel: Recent Labs  Lab 09/17/23 1549 09/19/23 1529 09/24/23 1128  NA 132* 134* 133*  K 3.8 3.9 4.1  CL 95* 92* 92*  CO2 27 26 27   GLUCOSE 140* 105* 108*  BUN 44* 42* 29*  CREATININE 7.02* 7.67* 7.80*  CALCIUM 8.5* 8.5* 8.4*  PHOS  --  4.7* 3.8   Liver Function Tests: Recent Labs  Lab 09/19/23 1529 09/24/23 1128  ALBUMIN 2.8* 2.4*   CBC: Recent Labs  Lab 09/17/23 1549 09/19/23 1529 09/24/23 1128  WBC 8.6 9.3 10.1  NEUTROABS 5.6  --   --   HGB 10.0* 9.4* 9.2*  HCT 29.9* 29.0* 28.0*  MCV 91.4 89.0 92.1  PLT 197 181 205    Medications:  anticoagulant sodium citrate      allopurinol  100 mg Oral Daily   amLODipine  5 mg Oral QHS   aspirin EC  81 mg Oral Daily   calcitRIOL  0.25 mcg Oral Q M,W,F   Chlorhexidine Gluconate Cloth  6 each Topical Q12H   darbepoetin (ARANESP) injection - DIALYSIS  40 mcg Subcutaneous Q Mon-1800    feeding supplement  237 mL Oral BID BM   folic acid  0.5 mg Oral Daily   heparin injection (subcutaneous)  5,000 Units Subcutaneous Q8H   leflunomide  10 mg Oral Daily   lidocaine  1 patch Transdermal Q24H   pantoprazole  40 mg Oral BID   polycarbophil  625 mg Oral Daily   rosuvastatin  40 mg Oral Daily   saccharomyces boulardii  250 mg Oral BID   sevelamer carbonate  800 mg Oral BID WC    Dialysis Orders: [facility] in [place]. RIJ TDC for the access.  MWF 4hrs 400/800 2K 2.5 Ca EDW 70.8 Venofer 50mg  q2wks Mircera q2wks Calcitriol 0.33mcg qHD No Heparin     Assessment/Plan: 1. 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 Cardioembolic in setting of A fib most likely per neuro. In CIR. 2. ESRD -on HD MWF.  NO HEPARIN. Minimizing UF with HD due to orthostatic hypotension, see below.  3. Anemia of CKD- last Hgb 9.2, will start aranesp.  4. Secondary hyperparathyroidism - Ca and phos at goal. Continue VDRA and renvela. 5.  HTN/volume - Orthostatic hypotension during PT.  Permissive mild hypertension d/t symptomatic orthostasis.  Hydralazine discontinued. Decreased amlodipine to 5mg  and changed to at bedtime schedule.   Does not appear volume overloaded, no UF with HD, will let volume come up a bit to see if this helps.  Closer to dry weight now. Also discussed abdominal binder and compression stockings. Seems to be improving. 6. Nutrition - On heart healthy diet w/fluid restrictions. Monitor labs 7. Dispo - tentative plan for d/c on 09/25/23.  Virgina Norfolk, PA-C [facility] 09/24/2023,1:25 PM  LOS: 8 days

## 2023-09-24 NOTE — Progress Notes (Signed)
Patient ID: Jacob Farmer, male   DOB: 1946-09-15, 77 y.o.   MRN: [id number]   Transport Chair recommended. Ordered through [employer].

## 2023-09-24 NOTE — Progress Notes (Signed)
Received patient in bed to unit.  Alert and oriented.  Informed consent signed and in chart.   TX duration: 3 hours  Patient tolerated well.  Transported back to the room  Alert, without acute distress.  Hand-off given to patient's nurse.   Access used: R HD Cath Access issues: none  Total UF removed: Medication(s) given: none   09/24/23 1739  Vitals  Temp 98 F (36.7 C)  Temp Source Oral  BP (!) 180/82  MAP (mmHg) 110  BP Location Left Wrist  BP Method Automatic  Patient Position (if appropriate) Lying  Pulse Rate 65  Pulse Rate Source Monitor  ECG Heart Rate 66  Resp 14  Oxygen Therapy  SpO2 100 %  O2 Device Room Air  During Treatment Monitoring  HD Safety Checks Performed Yes  Intra-Hemodialysis Comments Tx completed;Tolerated well  Dialysis Fluid Bolus Normal Saline  Bolus Amount (mL) 300 mL  Hemodialysis Catheter Right Internal jugular Double lumen Permanent (Tunneled)  Placement Date/Time: 05/30/23 1447   Serial / Lot #: [id number]  Expiration Date: 07/25/27  Time Out: Correct site;Correct patient;Correct procedure  Maximum sterile barrier precautions: Hand hygiene;Cap;Mask;Sterile gown;Sterile gloves;Large sterile ...  Site Condition No complications  Blue Lumen Status Flushed;Heparin locked;Dead end cap in place  Red Lumen Status Flushed;Heparin locked;Dead end cap in place  Purple Lumen Status N/A  Catheter fill solution Heparin 1000 units/ml  Catheter fill volume (Arterial) 1.9 cc  Catheter fill volume (Venous) 1.9  Dressing Type Transparent  Dressing Status Antimicrobial disc in place;Clean, Dry, Intact  Interventions  (deaccessed)  Drainage Description None  Dressing Change Due 09/29/23  Post treatment catheter status Capped and Clamped     Stacie Glaze LPN [facility]

## 2023-09-24 NOTE — Plan of Care (Signed)
  Problem: RH Balance Goal: LTG Patient will maintain dynamic standing with ADLs (OT) Description: LTG:  Patient will maintain dynamic standing balance with assist during activities of daily living (OT)  Outcome: Completed/Met   Problem: Sit to Stand Goal: LTG:  Patient will perform sit to stand in prep for activites of daily living with assistance level (OT) Description: LTG:  Patient will perform sit to stand in prep for activites of daily living with assistance level (OT) Outcome: Completed/Met   Problem: RH Bathing Goal: LTG Patient will bathe all body parts with assist levels (OT) Description: LTG: Patient will bathe all body parts with assist levels (OT) Outcome: Completed/Met   Problem: RH Dressing Goal: LTG Patient will perform lower body dressing w/assist (OT) Description: LTG: Patient will perform lower body dressing with assist, with/without cues in positioning using equipment (OT) Outcome: Completed/Met   Problem: RH Toileting Goal: LTG Patient will perform toileting task (3/3 steps) with assistance level (OT) Description: LTG: Patient will perform toileting task (3/3 steps) with assistance level (OT)  Outcome: Completed/Met   Problem: RH Functional Use of Upper Extremity Goal: LTG Patient will use RT/LT upper extremity as a (OT) Description: LTG: Patient will use right/left upper extremity as a stabilizer/gross assist/diminished/nondominant/dominant level with assist, with/without cues during functional activity (OT) Outcome: Completed/Met   Problem: RH Toilet Transfers Goal: LTG Patient will perform toilet transfers w/assist (OT) Description: LTG: Patient will perform toilet transfers with assist, with/without cues using equipment (OT) Outcome: Completed/Met

## 2023-09-24 NOTE — Progress Notes (Signed)
Patient ID: Jacob Farmer, male   DOB: 1946-08-27, 77 y.o.   MRN: [id number]  Team Conference Report to Patient/Family  Team Conference discussion was reviewed with the patient and caregiver, including goals, any changes in plan of care and target discharge date.  Patient and caregiver express understanding and are in agreement.  The patient has a target discharge date of 09/25/23.   SW met with pt and spouse in the room and provided team conference updates. Pt and spouse to purchase quad cane privately after discharge. Transport chair ordered through [employer]. Sw has confirmed pt/spouse do not have financial responsibility for item. WC covered through insurance. No additional questions or concerns.  Andria Rhein 09/24/2023, 2:00 PM

## 2023-09-24 NOTE — Progress Notes (Signed)
Occupational Therapy Session Note  Patient Details  Name: Jacob Farmer MRN: [id number] Date of Birth: 09/01/1946  Today's Date: 09/24/2023 OT Individual Time: [phone] and 1100-1120 OT Individual Time Calculation (min): 18 min and 20 min    Short Term Goals: Week 1:  OT Short Term Goal 1 (Week 1): STGs = LTGs  Skilled Therapeutic Interventions/Progress Updates:    Visit 1:  Pain:  c/o back pain, resting in bed  Pt in bed and stated he just wanted to lay down as he had walked quite a bit this am.  Used this session, for pt/family education and reviewed how they are feeling about his discharge.  They are both comfortable with his mobility at this point.  Reviewed energy conservation strategies.  Pt had completed self care this am.  Completed discharge eval. Pt resting in bed with all needs met.    Visit 2: Pain:  same c/o of back pain. Pt received in bed and politely declined getting out of bed. "Today is a dialysis day and I do not want to push myself" Pt agreeable to reviewing his HEP with me demonstrating. Pt is taking home a green theraband and provided with written HEP and a handout given to his wife [name] on how to provide supervision and energy conservation strategies.  Reviewed the exercises with pt.  Pt in bed with all needs met and wife present.   Therapy Documentation Precautions:  Precautions Precautions: Fall Precaution Comments: L hemipareisis, orthostasis Restrictions Weight Bearing Restrictions: No    Vital Signs: Therapy Vitals Temp: 98.1 F (36.7 C) Pulse Rate: 66 Resp: 18 BP: (!) 138/55 Patient Position (if appropriate): Lying Oxygen Therapy SpO2: 98 % O2 Device: Room Air    ADL: ADL ADL Comments: supervision overall      Therapy/Group: Individual Therapy  Westlynn Fifer 09/24/2023, 8:27 AM

## 2023-09-25 ENCOUNTER — Other Ambulatory Visit (HOSPITAL_COMMUNITY): Payer: Self-pay

## 2023-09-25 ENCOUNTER — Telehealth: Payer: Self-pay | Admitting: Internal Medicine

## 2023-09-25 MED ORDER — DARBEPOETIN ALFA 40 MCG/0.4ML IJ SOSY: 40.0000 ug | PREFILLED_SYRINGE | INTRAMUSCULAR | Status: DC

## 2023-09-25 MED ORDER — CALCIUM POLYCARBOPHIL 625 MG PO TABS
625.0000 mg | ORAL_TABLET | Freq: Every day | ORAL | 1 refills | Status: DC
  Filled 2023-09-25: qty 30, 30d supply, fill #0

## 2023-09-25 MED ORDER — ACETAMINOPHEN 325 MG PO TABS: 325.0000 mg | ORAL_TABLET | ORAL | Status: DC | PRN

## 2023-09-25 MED ORDER — AMLODIPINE BESYLATE 10 MG PO TABS: 5.0000 mg | ORAL_TABLET | Freq: Every day | ORAL | Status: DC

## 2023-09-25 NOTE — Progress Notes (Signed)
Physical Therapy Session Note  Patient Details  Name: Jacob Farmer MRN: [id number] Date of Birth: 01-20-1946  Today's Date: 09/24/2023 PT Individual Time:  1031-1100  PT Individual Time Calculation (min): 29 min  Short Term Goals: Week 1:  PT Short Term Goal 1 (Week 1): Pt will properly utilize TED hose and abdominal binder to assist with orthostatic hypotension. PT Short Term Goal 2 (Week 1): Pt will perform bed mobility with distant supervision and less c/o dizziness. PT Short Term Goal 3 (Week 1): Pt will perform functional standing transfers with overall light CGA. PT Short Term Goal 4 (Week 1): Pt will initiate gait training using LRAD. PT Short Term Goal 5 (Week 1): Pt will initiate stair training with no HR assist using LRAD.  Skilled Therapeutic Interventions/Progress Updates:  Patient supine in bed on entrance to room. Wife present. Patient alert and agreeable to PT session.   Patient with no pain complaint at start of session.  Wife relates having a good session earlier with PTA for family education session and is less anxious re: return home after watching pt perform so well and happy with progress   Therapeutic Activity: Bed Mobility: Pt performed supine <> sit with Mod I. No cueing required. Transfers: Pt performed sit<>stand and stand pivot transfers throughout session with supervision/ Mod I requiring supervision d/t fatigue from earlier session. Pt has dialysis this afternoon and all therapy sessions are completed before noon today.  Neuromuscular Re-ed: NMR facilitated during session with focus on standing balance. Pt guided in completion of Berg Balance Test. Patient demonstrates increased fall risk as noted by score of  41/56 on Berg Balance Scale.  (<36= high risk for falls, close to 100%; 37-45 significant >80%; 46-51 moderate >50%; 52-55 lower >25%) Although this still demonstrates an increased fall risk, it is a significant improvement since initial scoring one  week ago of 29/ 56.  NMR performed for improvements in motor control and coordination, balance, sequencing, judgement, and self confidence/ efficacy in performing all aspects of mobility at highest level of independence.   Patient supine in bed at end of session with brakes locked, bed alarm set, and all needs within reach.   Therapy Documentation Precautions:  Precautions Precautions: Fall Precaution Comments: occasional orthostasis Restrictions Weight Bearing Restrictions: No Pain:  Mild to mod back pain related during session. Addressed with repositioning.   Therapy/Group: Individual Therapy  Loel Dubonnet PT, DPT, CSRS 09/24/2023, 5:20 PM

## 2023-09-25 NOTE — Patient Care Conference (Signed)
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 Claudette Laws Social Worker Present: Lavera Guise, BSW Nurse Present: Chana Bode, RN PT Present: Ralph Leyden, PT OT Present: Primitivo Gauze, OT SLP Present: Everardo Pacific, SLP PPS Coordinator present : Fae Pippin, SLP     Current Status/Progress Goal Weekly Team Focus  Bowel/Bladder   continent of bowel/ Oliguric (HD patient)   Maintain bowel function   Assist with BR needs qshift and prn    Swallow/Nutrition/ Hydration               ADL's   progressing well and is now at a supervision level with ADLS and close Supervision to occasional CGA with ambulation , L hand now functional   supervision bathing, dressing, sit to stand, standing balance due to low blood pressure   pt/fam educ, HEP, activity tolerance    Mobility   bed mobility = Mod I, transfers = supervision, ambulation = close supervision for up to 200' at longest with [facility]. In general can ambulate household distances with supervision and no AD   overall supervision  Barriers: continued but improving orthostasis, mild L sided weakness /// Work on: L hemibody NMR, general strengthening, safety in transfers, ambulation, stair training, family educ.    Communication                Safety/Cognition/ Behavioral Observations               Pain   no c/o pain   Remain pain  free   Assess qshift and prn    Skin   Intact, Red bottom, appears to be a boil, barrier cream applied   Maintain skin integrity  Assess qshift and prn      Discharge Planning:  Discharging home with spouse tomorrow. No barriers.   Team Discussion: Post Parietal lobe infarct with dizziness, poor coordination, orthostasis and low endurance. Patient on target to meet rehab goals: yes, Currently supervision overall. Dizziness resolved using abdominal binder. Able to ambulate without assistive device with close supervision.   *See Care Plan and progress notes for long and short-term goals.   Revisions to Treatment Plan:  N/A  Teaching Needs: Safety, medication, dietary modifications, toileting, transfers, etc.  Current Barriers to Discharge: Hemodialysis  Possible Resolutions to Barriers: Family education HH follow up services  DME- TTB, quad cane     Medical Summary Current Status: orthostatic hypotension improving  Barriers to Discharge: Medical stability;Hypotension   Possible Resolutions to [employer] Focus: coordination with nephrologist on BP issues   Continued Need for Acute Rehabilitation Level of Care: The patient requires daily medical management by a physician with specialized training in physical medicine and rehabilitation for the following reasons: Direction of a multidisciplinary physical rehabilitation program to maximize functional independence : Yes Medical management of patient stability for increased activity during participation in an intensive rehabilitation regime.: Yes Analysis of laboratory  values and/or radiology reports with any subsequent need for medication adjustment and/or medical intervention. : Yes   I attest that I was present, lead the team conference, and concur with the assessment and plan of the team.   Felisa Bonier Jamesia Linnen 09/25/2023, 12:12 PM

## 2023-09-25 NOTE — Progress Notes (Addendum)
Inpatient Rehabilitation Discharge Medication Review by a Pharmacist  A complete drug regimen review was completed for this patient to identify any potential clinically significant medication issues.  High Risk Drug Classes Is patient taking? Indication by Medication  Antipsychotic No   Anticoagulant No   Antibiotic No   Opioid No   Antiplatelet Yes Aspirin 81 mg - CVA prophylaxis  Hypoglycemics/insulin No   Vasoactive Medication Yes Amlodipine - hypertension  Chemotherapy Yes, Chemotherapy by other route and No Eligard SQ to resume as outpatient - prostate cancer  Other Yes Allopurinol - gout prophylaxis Calcitriol - secondary hyperparathyroidism  Leflunomide - rheumatoid arthritis Lidocaine patch - topical pain relief Pantoprazole - GERD Rosuvastatin - hyperlipidemia Sevelamer - phosphate binder Folic acid - supplement  Fibercon - laxative Dupixent - atopic dermatitis Eligard - prostate cancer Triamcinolone topical - skin irritation  PRNs: Acetaminophen - mild pain     Type of Medication Issue Identified Description of Issue Recommendation(s)  Drug Interaction(s) (clinically significant)     Duplicate Therapy     Allergy     No Medication Administration End Date     Incorrect Dose     Additional Drug Therapy Needed     Significant med changes from prior encounter (inform family/care partners about these prior to discharge). Hydralazine discontinued on 10/2 Amlodipine dose decreased 10/5 Communicate changes with patient/family prior to discharge.  Other  PTA meds: Degarelix (Firmagon) - 240 mg x 1 given 8/6 Leuprolide (Eligard) - Q4 months, last 9/10 - follows with Dr. Hillis Range Dupixent (unknown when last given)  Aranesp 40 mcg given 10/7   Was using prn Trazodone while on CIR. To resume Dupixent and Eligard after discharge.       Back to [facility] at [facility] as prior to admission.   Per Kela Millin, PA-C, patient did not wish to continue at  discharge.    Clinically significant medication issues were identified that warrant physician communication and completion of prescribed/recommended actions by midnight of the next day:  No  Time spent performing this drug regimen review (minutes):  [address]   Dennie Fetters, [place] 09/25/2023 8:08 AM

## 2023-09-25 NOTE — Telephone Encounter (Signed)
Lucky from [facility] calling needs the okay to treat patient on Tuesday since he does not have dialysis that- need it by the end of the day. Please advise [phone] Thank you

## 2023-09-25 NOTE — Plan of Care (Signed)
Problem: RH Balance Goal: LTG Patient will maintain dynamic standing balance (PT) Description: LTG:  Patient will maintain dynamic standing balance with assistance during mobility activities (PT) 09/25/2023 0540 by Loel Dubonnet, PT Outcome: Completed/Met 09/25/2023 0538 by Loel Dubonnet, PT Flowsheets (Taken 09/17/2023 1640) LTG: Pt will maintain dynamic standing balance during mobility activities with:: Independent with assistive device    Problem: Sit to Stand Goal: LTG:  Patient will perform sit to stand with assistance level (PT) Description: LTG:  Patient will perform sit to stand with assistance level (PT) 09/25/2023 0540 by Loel Dubonnet, PT Outcome: Completed/Met 09/25/2023 0538 by Loel Dubonnet, PT Flowsheets (Taken 09/17/2023 1640) LTG: PT will perform sit to stand in preparation for functional mobility with assistance level: Independent with assistive device   Problem: RH Bed Mobility Goal: LTG Patient will perform bed mobility with assist (PT) Description: LTG: Patient will perform bed mobility with assistance, with/without cues (PT). 09/25/2023 0540 by Loel Dubonnet, PT Outcome: Completed/Met 09/25/2023 0538 by Loel Dubonnet, PT Flowsheets (Taken 09/17/2023 1640) LTG: Pt will perform bed mobility with assistance level of: Independent with assistive device    Problem: RH Bed to Chair Transfers Goal: LTG Patient will perform bed/chair transfers w/assist (PT) Description: LTG: Patient will perform bed to chair transfers with assistance (PT). 09/25/2023 0540 by Loel Dubonnet, PT Outcome: Completed/Met 09/25/2023 0538 by Loel Dubonnet, PT Flowsheets (Taken 09/17/2023 1640) LTG: Pt will perform Bed to Chair Transfers with assistance level: Supervision/Verbal cueing   Problem: RH Car Transfers Goal: LTG Patient will perform car transfers with assist (PT) Description: LTG: Patient will perform car transfers with assistance (PT). 09/25/2023 0540 by Loel Dubonnet,  PT Outcome: Completed/Met 09/25/2023 0538 by Loel Dubonnet, PT Flowsheets (Taken 09/17/2023 1640) LTG: Pt will perform car transfers with assist:: Supervision/Verbal cueing   Problem: RH Furniture Transfers Goal: LTG Patient will perform furniture transfers w/assist (OT/PT) Description: LTG: Patient will perform furniture transfers  with assistance (OT/PT). 09/25/2023 0540 by Loel Dubonnet, PT Outcome: Completed/Met 09/25/2023 0538 by Loel Dubonnet, PT Flowsheets (Taken 09/17/2023 1640) LTG: Pt will perform furniture transfers with assist:: Supervision/Verbal cueing   Problem: RH Ambulation Goal: LTG Patient will ambulate in controlled environment (PT) Description: LTG: Patient will ambulate in a controlled environment, # of feet with assistance (PT). 09/25/2023 0540 by Loel Dubonnet, PT Outcome: Completed/Met 09/25/2023 0538 by Loel Dubonnet, PT Flowsheets (Taken 09/17/2023 1640) LTG: Pt will ambulate in controlled environ  assist needed:: Supervision/Verbal cueing LTG: Ambulation distance in controlled environment: at least 100 ft using LRAD Goal: LTG Patient will ambulate in home environment (PT) Description: LTG: Patient will ambulate in home environment, # of feet with assistance (PT). 09/25/2023 0540 by Loel Dubonnet, PT Outcome: Completed/Met 09/25/2023 0538 by Loel Dubonnet, PT Flowsheets (Taken 09/17/2023 1640) LTG: Pt will ambulate in home environ  assist needed:: Supervision/Verbal cueing LTG: Ambulation distance in home environment: up to 50 ft per bout using LRAD   Problem: RH Stairs Goal: LTG Patient will ambulate up and down stairs w/assist (PT) Description: LTG: Patient will ambulate up and down # of stairs with assistance (PT) 09/25/2023 0540 by Loel Dubonnet, PT Outcome: Completed/Met 09/25/2023 0538 by Loel Dubonnet, PT Flowsheets (Taken 09/17/2023 1640) LTG: Pt will ambulate up/down stairs assist needed:: Supervision/Verbal cueing LTG: Pt will   ambulate up and down number of stairs: at least 2 steps using no HR as per home environment with LRAD

## 2023-09-25 NOTE — Progress Notes (Signed)
PROGRESS NOTE   Subjective/Complaints:  No issues overnite Discussed no driving until cleared by MD   ROS-  Pt denies SOB, abd pain, CP, N/V/C/D, and vision changes   Objective:   No results found. Recent Labs    09/24/23 1128  WBC 10.1  HGB 9.2*  HCT 28.0*  PLT 205    Recent Labs    09/24/23 1128  NA 133*  K 4.1  CL 92*  CO2 27  GLUCOSE 108*  BUN 29*  CREATININE 7.80*  CALCIUM 8.4*     Intake/Output Summary (Last 24 hours) at 09/25/2023 0759 Last data filed at 09/24/2023 1802 Gross per 24 hour  Intake --  Output 500 ml  Net -500 ml        Physical Exam: Vital Signs Blood pressure (!) 155/61, pulse 76, temperature 98.8 F (37.1 C), resp. rate 18, height 5\' 10"  (1.778 m), weight 69.9 kg, SpO2 96%.   General: No acute distress Mood and affect are appropriate Heart: Regular rate and rhythm no rubs murmurs or extra sounds Lungs: Clear to auscultation, breathing unlabored, no rales or wheezes Abdomen: Positive bowel sounds, soft nontender to palpation, nondistended Extremities: No clubbing, cyanosis, or edema Skin: No evidence of breakdown, no evidence of rash   PRIOR EXAMS: Neurologic: Cranial nerves II through XII intact, motor strength is 5/5 in RIght and 4+ Left  deltoid, bicep, tricep, grip, hip flexor, knee extensors, ankle dorsiflexor and plantar flexor  Cerebellar exam normal finger to nose to finger as well as heel to shin in RIght and mild Left  upper and lower extremities Musculoskeletal: Full range of motion in all 4 extremities. No joint swelling   Assessment/Plan: 1. 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  Stable for D/C today F/u PCP in 3-4 weeks F/u PM&R 2 weeks See D/C summary See D/C instructions   Care Tool:  Bathing    Body parts bathed by patient: Right arm, Left arm, Chest, Abdomen, Front perineal area, Buttocks, Right upper leg, Face, Left lower leg, Right  lower leg, Left upper leg         Bathing assist Assist Level: Supervision/Verbal cueing     Upper Body Dressing/Undressing Upper body dressing   What is the patient wearing?: Pull over shirt    Upper body assist Assist Level: Independent    Lower Body Dressing/Undressing Lower body dressing      What is the patient wearing?: Underwear/pull up, Pants     Lower body assist Assist for lower body dressing: Supervision/Verbal cueing     Toileting Toileting Toileting Activity did not occur (Clothing management and hygiene only): N/A (no void or bm)  Toileting assist Assist for toileting: Supervision/Verbal cueing     Transfers Chair/bed transfer  Transfers assist     Chair/bed transfer assist level: Supervision/Verbal cueing     Locomotion Ambulation   Ambulation assist   Ambulation activity did not occur: Safety/medical concerns  Assist level: Supervision/Verbal cueing Assistive device: No Device Max distance: 250 ft   Walk 10 feet activity   Assist  Walk 10 feet activity did not occur: Safety/medical concerns  Assist level: Supervision/Verbal cueing Assistive device: No Device  Walk 50 feet activity   Assist Walk 50 feet with 2 turns activity did not occur: Safety/medical concerns  Assist level: Supervision/Verbal cueing Assistive device: No Device    Walk 150 feet activity   Assist Walk 150 feet activity did not occur: Safety/medical concerns  Assist level: Supervision/Verbal cueing Assistive device: No Device    Walk 10 feet on uneven surface  activity   Assist Walk 10 feet on uneven surfaces activity did not occur: Safety/medical concerns   Assist level: Contact Guard/Touching assist Assistive device: Cane-quad   Wheelchair     Assist Is the patient using a wheelchair?: Yes Type of Wheelchair: Manual Wheelchair activity did not occur: Safety/medical concerns  Wheelchair assist level: Maximal Assistance - Patient 25 -  49% (limited activity tolerance with BUE and in this body position) Max wheelchair distance: 50 ft    Wheelchair 50 feet with 2 turns activity    Assist    Wheelchair 50 feet with 2 turns activity did not occur: Safety/medical concerns   Assist Level: Maximal Assistance - Patient 25 - 49%   Wheelchair 150 feet activity     Assist  Wheelchair 150 feet activity did not occur: Safety/medical concerns   Assist Level: Dependent - Patient 0%   Blood pressure (!) 155/61, pulse 76, temperature 98.8 F (37.1 C), resp. rate 18, height 5\' 10"  (1.778 m), weight 69.9 kg, SpO2 96%.  Medical Problem List and Plan: 1. 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  Antithrombotics: -DVT/anticoagulation:  Pharmaceutical: Heparin             -antiplatelet therapy: aspirin 81 mg   3. Pain Management: Tylenol as needed   10/7- denies pain- con't regimen 4. Mood/Behavior/Sleep: LCSW to evaluate and provide emotional support             -antipsychotic agents: n/a   On trazodone and melatonin  5. Neuropsych/cognition: This patient appears capable of making decisions on his own behalf.   6. Skin/Wound Care: Routine skin care checks   7. Fluids/Electrolytes/Nutrition: Routine Is and Os and follow-up chemistries.  Routine labs as per nephrology.             -continue Calcitrol on Monday Wednesday and Fridays             -continue Folvite and Renvela   8: Hypertension: monitor TID and prn             -continue amlodipine 10 mg daily>> down to 5mg  QHS             -continue hydralazine 50 mg BID>>d/c'd  Check ortho vitals for hypotension - worse day after HD, will use thigh hi TED hose waiting for abd binder   Allowing sys BP to run higher in lying position to allow for orthostatic drops , will d/c TEDs and Abd binder if pt tolerates  Vitals:   09/24/23 1445 09/24/23 1500 09/24/23 1530 09/24/23 1600  BP: (!) 167/76 (!)  164/81 (!) 170/82 (!) 167/78   09/24/23 1630 09/24/23 1700 09/24/23 1730 09/24/23 1739  BP: (!) 169/76 (!) 168/68 (!) 187/75 (!) 180/82   09/24/23 1744 09/24/23 1832 09/24/23 1959 09/25/23 0514  BP: (!) 182/80 (!) 181/76 (!) 172/74 (!) 155/61  Pt still wears abd binder due to back pain not dizziness  May need further adjustments on OP basis per PCP +/- nephro  9: Hyperlipidemia: continue statin   10: History of gout: Continue allopurinol 100 mg daily   11: Diarrhea: C. difficile is negative.  Continue loperamide 2 mg every 8 hours.  addprobiotics, fiber supplement.   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 ESRD- HD per renal Cont M-W-F HD managed by Nephro  15. L hand swelling: as expected post CVA with weakness, cont elevation and edema reduction techniques per OT       LOS: 9 days A FACE TO FACE EVALUATION WAS PERFORMED  Erick Colace 09/25/2023, 7:59 AM

## 2023-09-25 NOTE — Progress Notes (Signed)
Pt to d/c today. Contacted [facility] to advise clinic that pt will d/c today as planned and should resume care tomorrow.   Olivia Canter Renal Navigator [phone]

## 2023-09-25 NOTE — Telephone Encounter (Signed)
Verbal orders given to lucky

## 2023-09-26 DIAGNOSIS — Z992 Dependence on renal dialysis: Secondary | ICD-10-CM | POA: Diagnosis not present

## 2023-09-26 DIAGNOSIS — N2581 Secondary hyperparathyroidism of renal origin: Secondary | ICD-10-CM | POA: Diagnosis not present

## 2023-09-26 DIAGNOSIS — N186 End stage renal disease: Secondary | ICD-10-CM | POA: Diagnosis not present

## 2023-09-29 ENCOUNTER — Telehealth: Payer: Self-pay

## 2023-09-29 DIAGNOSIS — N2581 Secondary hyperparathyroidism of renal origin: Secondary | ICD-10-CM | POA: Diagnosis not present

## 2023-09-29 DIAGNOSIS — Z992 Dependence on renal dialysis: Secondary | ICD-10-CM | POA: Diagnosis not present

## 2023-09-29 DIAGNOSIS — N186 End stage renal disease: Secondary | ICD-10-CM | POA: Diagnosis not present

## 2023-09-29 NOTE — Telephone Encounter (Signed)
Received a call from [facility] at [facility] requesting to reschedule patient for AVF surgery. Noted patient's recent hospitalization and advised that he will need to be cleared by neurology and cardiology again. Per d/c summary patient is to recommended to follow up with Dr. Pearlean Brownie and Dr. Wyline Mood. Boyd Kerbs voiced understanding.

## 2023-09-30 ENCOUNTER — Encounter: Payer: Self-pay | Admitting: Cardiology

## 2023-09-30 ENCOUNTER — Ambulatory Visit: Payer: Medicare HMO | Admitting: Urology

## 2023-09-30 ENCOUNTER — Ambulatory Visit: Payer: Medicare HMO | Attending: Cardiology | Admitting: Cardiology

## 2023-09-30 VITALS — BP 138/74 | HR 81 | Ht 70.0 in | Wt 153.8 lb

## 2023-09-30 DIAGNOSIS — Z992 Dependence on renal dialysis: Secondary | ICD-10-CM

## 2023-09-30 DIAGNOSIS — I2510 Atherosclerotic heart disease of native coronary artery without angina pectoris: Secondary | ICD-10-CM

## 2023-09-30 DIAGNOSIS — E785 Hyperlipidemia, unspecified: Secondary | ICD-10-CM

## 2023-09-30 DIAGNOSIS — C61 Malignant neoplasm of prostate: Secondary | ICD-10-CM | POA: Diagnosis not present

## 2023-09-30 DIAGNOSIS — D649 Anemia, unspecified: Secondary | ICD-10-CM

## 2023-09-30 DIAGNOSIS — I10 Essential (primary) hypertension: Secondary | ICD-10-CM | POA: Diagnosis not present

## 2023-09-30 DIAGNOSIS — I69392 Facial weakness following cerebral infarction: Secondary | ICD-10-CM | POA: Diagnosis not present

## 2023-09-30 DIAGNOSIS — N186 End stage renal disease: Secondary | ICD-10-CM

## 2023-09-30 DIAGNOSIS — E1122 Type 2 diabetes mellitus with diabetic chronic kidney disease: Secondary | ICD-10-CM | POA: Diagnosis not present

## 2023-09-30 DIAGNOSIS — I639 Cerebral infarction, unspecified: Secondary | ICD-10-CM | POA: Diagnosis not present

## 2023-09-30 DIAGNOSIS — M069 Rheumatoid arthritis, unspecified: Secondary | ICD-10-CM | POA: Diagnosis not present

## 2023-09-30 DIAGNOSIS — Z8679 Personal history of other diseases of the circulatory system: Secondary | ICD-10-CM

## 2023-09-30 DIAGNOSIS — I4891 Unspecified atrial fibrillation: Secondary | ICD-10-CM | POA: Diagnosis not present

## 2023-09-30 DIAGNOSIS — I120 Hypertensive chronic kidney disease with stage 5 chronic kidney disease or end stage renal disease: Secondary | ICD-10-CM | POA: Diagnosis not present

## 2023-09-30 DIAGNOSIS — I480 Paroxysmal atrial fibrillation: Secondary | ICD-10-CM | POA: Diagnosis not present

## 2023-09-30 DIAGNOSIS — I69354 Hemiplegia and hemiparesis following cerebral infarction affecting left non-dominant side: Secondary | ICD-10-CM | POA: Diagnosis not present

## 2023-09-30 NOTE — Patient Instructions (Addendum)
Medication Instructions:   Continue all current medications.   Labwork:  none  Testing/Procedures:  none  Follow-Up:  As scheduled   Any Other Special Instructions Will Be Listed Below (If Applicable).  You have been referred to EP (electrophysiology)    If you need a refill on your cardiac medications before your next appointment, please call your pharmacy.

## 2023-09-30 NOTE — Progress Notes (Signed)
Cardiology Office Note:  .   Date:  09/30/2023  ID:  Jacob Farmer, DOB 1946-06-01, MRN [id number] PCP: Anabel Halon, MD  [facility] Providers Cardiologist:  Dina Rich, MD    History of Present Illness: .   Jacob Farmer is a 77 y.o. male with a past medical history of coronary disease, paroxysmal atrial fibrillation, tachybradycardia syndrome, HFpEF, type 2 diabetes, essential hypertension with bouts of orthostatic hypotension, rheumatoid and osteoarthritis, end-stage renal disease, prostate cancer, who is here today for hospital follow-up.  Previously had acute myocardial infarction treated with tPA 1990, 1999 he had stents placed to the circumflex and RCA, residual total occlusion of the left anterior descending, normal ejection fraction, stress nuclear 2021 revealed distal anteroseptal ischemia and normal LV function.  He was hospitalized 04/2023 after having outpatient labs revealing significantly elevated serum creatinine of 4.71.  He was also noted to have acute blood loss anemia secondary to upper GI bleed, GI hemorrhage, AVM of the colon NSTEMI and transfused 2 units of blood.  He was given IV fluids for his elevated serum creatinine.  Received an additional 2 units of packed red blood cells.  Underwent EGD with no acute findings, capsule study showed AVMs in the small bowel with some blood without active bleeding lesions been identified.  Enteroscopy performed revealed duodenal ulcers and AVMs which were treated with clips and APC.  Mesenteric artery ultrasound revealed SMA stenosis for which the patient will follow-up with vascular after discharge.  Apixaban remains on hold and he was considered stable for discharge on 04/25/2023.  He was last seen in clinic 6//24 where he had continued to have complaints of dyspnea on exertion, two-pillow orthopnea, peripheral edema, dry itchy skin.  Kidney function was still elevated at 4.9 and was found to be hemoglobin of 4.9 which  required 4 units of blood.  He continued to remain off of apixaban.  Follow-up hemoglobin was 10 and he had already been scheduled for repeat blood work.  He was scheduled for an echocardiogram.  He presented to the [facility] emergency department 09/11/23 as a code stroke.  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  He presented with sudden onset weakness and numbness of the left arm as well as facial droop.  He was administered TNK to treat his stroke and experienced improvement in symptoms.  On 9/28 follow-up CT scan was done showing hemorrhagic transformation of his infarct. He had developed an ICH s/p TNK. He was carefully monitored in ICU and transferred out on 9/29 when he was noted to be stable.  MRI showed a small area of intraparenchymal hemorrhage without the right postcentral gyrus and mild surrounding edema.  Small foci of acute ischemia within the right parietal white matter at the base of the postcentral gyrus.  Echocardiogram completed revealed LVEF 60 to 65% with G1 DD.  After consideration from GI he was started on aspirin 81 mg daily.  During his hospitalization if he can tolerate a short-term AC then we will consider Watchman device as an outpatient.  He was transferred to [facility] for continued rehabilitation on 09/16/2023.  He was then to be discharged home with home health services on 09/25/2023.  He returns to clinic today accompanied by his wife.  Overall he states that he has been doing fairly well.  He denies any chest pain or shortness of breath.  He  recently placed on aspirin at hospital discharge.  He has not been compliant with dialysis on Monday Wednesday and Fridays he stated he has been running 4 hours each time.  Has not noticed any bleeding with being back on aspirin therapy or receiving heparin at dialysis.  Was advised in the hospital he should be evaluated for  possible Watchman procedure.  States that he has been compliant with all medications.  Has recently gotten home last Thursday from rehab and has been doing extremely well since his stroke.  ROS: 10 point review of systems has been reviewed and considered negative except what is been listed in the HPI  Studies Reviewed: Marland Kitchen       TTE 09/12/23 1. Left ventricular ejection fraction, by estimation, is 60 to 65%. The  left ventricle has normal function. The left ventricle demonstrates  regional wall motion abnormalities (see scoring diagram/findings for  description). Left ventricular diastolic  parameters are consistent with Grade I diastolic dysfunction (impaired  relaxation).   2. Right ventricular systolic function is normal. The right ventricular  size is normal. There is normal pulmonary artery systolic pressure.   3. The mitral valve is normal in structure. No evidence of mitral valve  regurgitation. No evidence of mitral stenosis.   4. The aortic valve is tricuspid. Aortic valve regurgitation is not  visualized. No aortic stenosis is present.   5. Aortic dilatation noted. There is mild dilatation of the aortic root,  measuring 41 mm.   6. The inferior vena cava is normal in size with greater than 50%  respiratory variability, suggesting right atrial pressure of 3 mmHg.   Ultrasound mesenteric arteries 04/25/2023 IMPRESSION: 1. 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 2. 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 Recommend follow-up every 3 years. This recommendation follows ACR consensus guidelines: White Paper of the ACR Incidental Findings Committee II on Vascular Findings. J Am Coll Radiol 2013; 10:789-794. Risk Assessment/Calculations:    CHA2DS2-VASc Score = 6   This indicates a 9.7% annual risk of  stroke. The patient's score is based upon: CHF History: 1 HTN History: 1 Diabetes History: 1 Stroke History: 0 Vascular Disease History: 1 Age Score: 2 Gender Score: 0            Physical Exam:   VS:  BP 138/74 (BP Location: Right Arm, Patient Position: Sitting, Cuff Size: Normal)   Pulse 81   Ht 5\' 10"  (1.778 m)   Wt 153 lb 12.8 oz (69.8 kg)   SpO2 95%   BMI 22.07 kg/m    Wt Readings from Last 3 Encounters:  09/30/23 153 lb 12.8 oz (69.8 kg)  09/24/23 154 lb 1.6 oz (69.9 kg)  09/16/23 (S) 139 lb 15.9 oz (63.5 kg)    GEN: Well nourished, well developed in no acute distress NECK: No JVD; No carotid bruits CARDIAC: RRR, no murmurs, rubs, gallops RESPIRATORY:  Clear to auscultation without rales, wheezing or rhonchi  ABDOMEN: Soft, non-tender, non-distended EXTREMITIES:  No edema; No deformity   ASSESSMENT AND PLAN: .   Coronary artery disease with history of remote stents.  Echocardiogram revealed an LVEF of 60-65%, with wall motion abnormalities, G1 DD.  He has not experienced any chest pain or significant dyspnea in the past.  Without any symptoms of angina or anginal equivalents he is not interested in repeating stress testing.  If he experiences any symptoms he has been encouraged to notify the office.  He has been continued on aspirin 81 mg daily and rosuvastatin 40 mg daily.  Not on beta-blocker therapy due to prior bradycardia.  Paroxysmal atrial fibrillation complicated by tachybradycardia syndrome.  Denies any recent palpitations and heart rate has been well-controlled with rate today noted to be 81.  He is not on any AV nodal blocking agents given prior history of bradycardia.  Previously evaluated by Dr. Ladona Ridgel with no indication for permanent pacemaker placement at the time.  Due to history of GI bleed no longer on anticoagulation.  Discussed he would need to be on short-term anticoagulation for possible Watchman device.  He has been referred to EP for  consideration.  Hypertension with orthostatic hypotension blood pressure today 158/82 with a recheck of 138/74.  He is not orthostatic today.  He states that occasionally he has some dizziness with dialysis.  He is continued on his current medication regimen of amlodipine 5 mg daily.  He is also been encouraged to continue to monitor his pressure 1 to 2 hours postmedication administration at home.  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  He is continued on rosuvastatin 40 mg daily.  Anemia with last hemoglobin 09/24/2023 of 9.2.  Currently remained stable.  Have requested labs from nephrology.  He reports no signs of active bleeding.  Recent stroke status post TNK with subsequent ICH.  He has upcoming follow-up with neurology.  He has been advised during his appointment with neurology will need to determine if they believe he is stable after the 50-month window to start [facility] for short period of time for the Watchman procedure if he is deemed appropriate by EP.  End-stage renal disease where he has hemodialysis on Monday Wednesday and Friday.  This continues to be managed by nephrology.  Patient has upcoming procedure for AV fistula placement.  According to [facility] AHA guidelines no further cardiovascular testing is needed patient patient may proceed with surgery at acceptable risk.       Mr. Hostetler perioperative risk of a major cardiac event is 11% according to the Revised Cardiac Risk Index (RCRI).  Therefore, he is at high risk for perioperative complications.   His functional capacity is fair at 4.64 METs according to the Duke Activity Status Index (DASI). Recommendations: According to ACC/AHA guidelines, no further cardiovascular testing needed.  The patient may proceed to surgery at acceptable risk.         Dispo: Patient to return to clinic to see MD/APP in 3 months or sooner if needed for evaluation of symptoms  Signed, Mohab Ashby, NP

## 2023-10-01 DIAGNOSIS — Z992 Dependence on renal dialysis: Secondary | ICD-10-CM | POA: Diagnosis not present

## 2023-10-01 DIAGNOSIS — N186 End stage renal disease: Secondary | ICD-10-CM | POA: Diagnosis not present

## 2023-10-01 DIAGNOSIS — N2581 Secondary hyperparathyroidism of renal origin: Secondary | ICD-10-CM | POA: Diagnosis not present

## 2023-10-03 DIAGNOSIS — Z992 Dependence on renal dialysis: Secondary | ICD-10-CM | POA: Diagnosis not present

## 2023-10-03 DIAGNOSIS — N2581 Secondary hyperparathyroidism of renal origin: Secondary | ICD-10-CM | POA: Diagnosis not present

## 2023-10-03 DIAGNOSIS — N186 End stage renal disease: Secondary | ICD-10-CM | POA: Diagnosis not present

## 2023-10-06 ENCOUNTER — Telehealth: Payer: Self-pay

## 2023-10-06 DIAGNOSIS — N2581 Secondary hyperparathyroidism of renal origin: Secondary | ICD-10-CM | POA: Diagnosis not present

## 2023-10-06 DIAGNOSIS — Z992 Dependence on renal dialysis: Secondary | ICD-10-CM | POA: Diagnosis not present

## 2023-10-06 DIAGNOSIS — N186 End stage renal disease: Secondary | ICD-10-CM | POA: Diagnosis not present

## 2023-10-06 NOTE — Telephone Encounter (Signed)
Per Dr. Roda Shutters and Charlsie Quest, scheduled the patient for [facility] consult with Dr. Jimmey Ralph 11/25/2023. Jacob Farmer was grateful for call and agreed with plan.

## 2023-10-07 ENCOUNTER — Encounter: Payer: Medicare HMO | Attending: Registered Nurse | Admitting: Registered Nurse

## 2023-10-07 ENCOUNTER — Encounter: Payer: Self-pay | Admitting: Registered Nurse

## 2023-10-07 VITALS — BP 137/69 | HR 71 | Ht 70.0 in | Wt 150.0 lb

## 2023-10-07 DIAGNOSIS — I6389 Other cerebral infarction: Secondary | ICD-10-CM | POA: Diagnosis not present

## 2023-10-07 DIAGNOSIS — I69392 Facial weakness following cerebral infarction: Secondary | ICD-10-CM | POA: Diagnosis not present

## 2023-10-07 DIAGNOSIS — Z992 Dependence on renal dialysis: Secondary | ICD-10-CM | POA: Diagnosis not present

## 2023-10-07 DIAGNOSIS — I480 Paroxysmal atrial fibrillation: Secondary | ICD-10-CM

## 2023-10-07 DIAGNOSIS — I10 Essential (primary) hypertension: Secondary | ICD-10-CM

## 2023-10-07 DIAGNOSIS — E1122 Type 2 diabetes mellitus with diabetic chronic kidney disease: Secondary | ICD-10-CM | POA: Diagnosis not present

## 2023-10-07 DIAGNOSIS — E7849 Other hyperlipidemia: Secondary | ICD-10-CM

## 2023-10-07 DIAGNOSIS — N186 End stage renal disease: Secondary | ICD-10-CM

## 2023-10-07 DIAGNOSIS — I120 Hypertensive chronic kidney disease with stage 5 chronic kidney disease or end stage renal disease: Secondary | ICD-10-CM | POA: Diagnosis not present

## 2023-10-07 DIAGNOSIS — M069 Rheumatoid arthritis, unspecified: Secondary | ICD-10-CM | POA: Diagnosis not present

## 2023-10-07 DIAGNOSIS — C61 Malignant neoplasm of prostate: Secondary | ICD-10-CM | POA: Diagnosis not present

## 2023-10-07 DIAGNOSIS — I69354 Hemiplegia and hemiparesis following cerebral infarction affecting left non-dominant side: Secondary | ICD-10-CM | POA: Diagnosis not present

## 2023-10-07 NOTE — Progress Notes (Unsigned)
Subjective:    Patient ID: Jacob Farmer, male    DOB: [birth year], 77 y.o.   MRN: [id number]  HPI: Jacob Farmer is a 77 y.o. male who returns for follow up appointment for chronic pain and medication refill. states *** pain is located in  ***. rates pain ***. current exercise regime is walking and performing stretching exercises.    Pain Inventory Average Pain 4 Pain Right Now 0 My pain is aching  LOCATION OF PAIN  back  BOWEL Number of stools per week: ?   BLADDER Normal    Mobility how many minutes can you walk? 3  Function retired  Neuro/Psych No problems in this area  Prior Studies Any changes since last visit?  no  Physicians involved in your care Any changes since last visit?  no   Family History  Problem Relation Age of Onset   Heart attack Mother    Coronary artery disease Father    Ulcerative colitis Father    Ulcers Father    Breast cancer Sister    COPD Brother    Pancreatic cancer Brother    Healthy Sister    Diabetes Brother    Cirrhosis Son    Seizures Son    Social History   Socioeconomic History   Marital status: Married    Spouse name: Not on file   Number of children: 2   Years of education: Not on file   Highest education level: Not on file  Occupational History   Occupation: Retired Personnel officer  Tobacco Use   Smoking status: Former    Current packs/day: 0.00    Average packs/day: 1 pack/day for 30.0 years (30.0 ttl pk-yrs)    Types: Cigarettes    Start date: 05/22/1959    Quit date: 05/21/1989    Years since quitting: 34.4    Passive exposure: Never   Smokeless tobacco: Never  Vaping Use   Vaping status: Never Used  Substance and Sexual Activity   Alcohol use: Yes    Comment: 1 drink per day   Drug use: No   Sexual activity: Not on file  Other Topics Concern   Not on file  Social History Narrative   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   Social Determinants of Health   Financial Resource Strain: Low Risk   (02/18/2023)   Overall Financial Resource Strain (CARDIA)    Difficulty of Paying Living Expenses: Not very hard  Food Insecurity: No Food Insecurity (04/28/2023)   Hunger Vital Sign    Worried About Running Out of Food in the Last Year: Never true    Ran Out of Food in the Last Year: Never true  Transportation Needs: No Transportation Needs (04/28/2023)   PRAPARE - Administrator, Civil Service (Medical): No    Lack of Transportation (Non-Medical): No  Physical Activity: Sufficiently Active (09/14/2021)   Exercise Vital Sign    Days of Exercise per Week: 7 days    Minutes of Exercise per Session: 60 min  Stress: No Stress Concern Present (09/14/2021)   [employer] of Occupational Health - Occupational Stress Questionnaire    Feeling of Stress : Not at all  Social Connections: Unknown (04/30/2022)   Received from [facility]   Social Network    Social Network: Not on file   Past Surgical History:  Procedure Laterality Date   BIOPSY  08/27/2019   Procedure: BIOPSY;  Surgeon: Malissa Hippo, MD;  [facility];  Service: Endoscopy;;  gastric   BIOPSY  04/03/2022   Procedure: BIOPSY;  Surgeon: Dolores Frame, MD;  [facility];  Service: Gastroenterology;;   BIOPSY  04/21/2023   Procedure: BIOPSY;  Surgeon: Lanelle Bal, DO;  [facility];  Service: Endoscopy;;   callus removal Left 09/13/2022   left foot   CARDIOVASCULAR STRESS TEST  2001  per Dr Dietrich Pates clinic note   distal anteroseptal ischemia,  normal LVF   COLONOSCOPY  last one 2006 (approx)   COLONOSCOPY WITH PROPOFOL N/A 04/03/2022   Procedure: COLONOSCOPY WITH PROPOFOL;  Surgeon: Dolores Frame, MD;  [facility];  Service: Gastroenterology;  Laterality: N/A;  945   CORONARY ANGIOPLASTY WITH STENT PLACEMENT  1999   DES x2  to CFX and RCA/  residual total occlusion LAD,  normal LVEF   ENTEROSCOPY N/A 04/24/2023   Procedure:  ENTEROSCOPY;  Surgeon: Dolores Frame, MD;  [facility];  Service: Gastroenterology;  Laterality: N/A;   ESOPHAGOGASTRODUODENOSCOPY (EGD) WITH PROPOFOL N/A 08/27/2019   Procedure: ESOPHAGOGASTRODUODENOSCOPY (EGD) WITH PROPOFOL;  Surgeon: Malissa Hippo, MD;  [facility];  Service: Endoscopy;  Laterality: N/A;  10:10   ESOPHAGOGASTRODUODENOSCOPY (EGD) WITH PROPOFOL N/A 04/03/2022   Procedure: ESOPHAGOGASTRODUODENOSCOPY (EGD) WITH PROPOFOL;  Surgeon: Dolores Frame, MD;  [facility];  Service: Gastroenterology;  Laterality: N/A;   ESOPHAGOGASTRODUODENOSCOPY (EGD) WITH PROPOFOL N/A 02/16/2023   Procedure: ESOPHAGOGASTRODUODENOSCOPY (EGD) WITH PROPOFOL;  Surgeon: Dolores Frame, MD;  [facility];  Service: Gastroenterology;  Laterality: N/A;   ESOPHAGOGASTRODUODENOSCOPY (EGD) WITH PROPOFOL N/A 04/21/2023   Procedure: ESOPHAGOGASTRODUODENOSCOPY (EGD) WITH PROPOFOL;  Surgeon: Lanelle Bal, DO;  [facility];  Service: Endoscopy;  Laterality: N/A;   EYE SURGERY Bilateral    cataract   GIVENS CAPSULE STUDY N/A 04/22/2023   Procedure: GIVENS CAPSULE STUDY;  Surgeon: Dolores Frame, MD;  [facility];  Service: Gastroenterology;  Laterality: N/A;   HEMOSTASIS CLIP PLACEMENT  04/24/2023   Procedure: HEMOSTASIS CLIP PLACEMENT;  Surgeon: Dolores Frame, MD;  [facility];  Service: Gastroenterology;;   HOT HEMOSTASIS  04/03/2022   Procedure: HOT HEMOSTASIS (ARGON PLASMA COAGULATION/BICAP);  Surgeon: Marguerita Merles, Reuel Boom, MD;  [facility];  Service: Gastroenterology;;   HOT HEMOSTASIS  04/24/2023   Procedure: HOT HEMOSTASIS (ARGON PLASMA COAGULATION/BICAP);  Surgeon: Marguerita Merles, Reuel Boom, MD;  [facility];  Service: Gastroenterology;;   IR FLUORO GUIDE CV LINE RIGHT  05/30/2023   IR US GUIDE VASC ACCESS RIGHT  05/30/2023   POLYPECTOMY  04/03/2022    Procedure: POLYPECTOMY;  Surgeon: Dolores Frame, MD;  [facility];  Service: Gastroenterology;;   POSTERIOR CERVICAL FUSION/FORAMINOTOMY N/A 05/26/2017   Procedure: RIGHT C4-5 FORAMINOTOMY WITH EXCISION OF HERNIATED DISC;  Surgeon: Kerrin Champagne, MD;  [facility];  Service: Orthopedics;  Laterality: N/A;   RADIOACTIVE SEED IMPLANT N/A 01/11/2016   Procedure: RADIOACTIVE SEED IMPLANT/BRACHYTHERAPY IMPLANT;  Surgeon: Marcine Matar, MD;  Location[facility];  Service: Urology;  Laterality: N/A;   73  seeds implanted no seeds founds in bladder   SCLEROTHERAPY  04/24/2023   Procedure: SCLEROTHERAPY;  Surgeon: Marguerita Merles, Reuel Boom, MD;  [facility];  Service: Gastroenterology;;   SUBMUCOSAL TATTOO INJECTION  04/24/2023   Procedure: SUBMUCOSAL TATTOO INJECTION;  Surgeon: Dolores Frame, MD;  [facility];  Service: Gastroenterology;;   Past Medical  History:  Diagnosis Date   CAD (coronary artery disease)    Acute myocardial infarction treated with TPA in 1990; 1999-stents to circumflex and RCA; residual total occlusion of the left anterior descending; normal ejection fraction; stress nuclear in 2001-distal anteroseptal ischemia; normal LV function   CKD (chronic kidney disease) stage 4, GFR 15-29 ml/min (HCC)    First degree heart block    History of gastric ulcer    History of kidney stones    History of MI (myocardial infarction)    1990-  treated w/ TPA   Hyperlipidemia    Hypertension    Jaundice    OA (osteoarthritis)    Prediabetes    Prostate cancer (HCC)    Stage T1c , Gleason 3+4,  PSA 4.7,  vol 24cc--  scheduled for radiative seed implants   Psoriatic arthritis (HCC)    RA (rheumatoid arthritis) (HCC)    Dr. Beatrix Fetters   Sinus bradycardia    Wears dentures    BP 137/69   Pulse 71   Ht 5\' 10"  (1.778 m)   Wt 150 lb (68 kg)   SpO2 96%   BMI 21.52 kg/m   Opioid Risk Score:   Fall Risk  Score:  `1  Depression screen [facility] 2/9     10/07/2023    2:03 PM 08/07/2023   10:21 AM 05/01/2023    9:47 AM 03/18/2023    1:51 PM 02/26/2023    1:04 PM 01/27/2023    9:53 AM 01/09/2023    1:52 PM  Depression screen PHQ 2/9  Decreased Interest 0 3 3 3  0 0 0  Down, Depressed, Hopeless 0 0 0 0 0 0 0  PHQ - 2 Score 0 3 3 3  0 0 0  Altered sleeping 2 0 0 0     Tired, decreased energy 2 3 3 3      Change in appetite 0 0 0 3     Feeling bad or failure about yourself  0 0 0 0     Trouble concentrating 0 0 0 0     Moving slowly or fidgety/restless 0 3 3 0     Suicidal thoughts 0 0 0 0     PHQ-9 Score 4 9 9 9      Difficult doing work/chores Not difficult at all Not difficult at all Not difficult at all Not difficult at all         Review of Systems  Musculoskeletal:  Positive for back pain and gait problem.  All other systems reviewed and are negative.     Objective:   Physical Exam        Assessment & Plan:

## 2023-10-08 DIAGNOSIS — N186 End stage renal disease: Secondary | ICD-10-CM | POA: Diagnosis not present

## 2023-10-08 DIAGNOSIS — N2581 Secondary hyperparathyroidism of renal origin: Secondary | ICD-10-CM | POA: Diagnosis not present

## 2023-10-08 DIAGNOSIS — Z992 Dependence on renal dialysis: Secondary | ICD-10-CM | POA: Diagnosis not present

## 2023-10-09 ENCOUNTER — Encounter: Payer: Self-pay | Admitting: Internal Medicine

## 2023-10-09 ENCOUNTER — Encounter: Payer: Self-pay | Admitting: Registered Nurse

## 2023-10-09 ENCOUNTER — Ambulatory Visit (INDEPENDENT_AMBULATORY_CARE_PROVIDER_SITE_OTHER): Payer: Medicare HMO | Admitting: Internal Medicine

## 2023-10-09 VITALS — BP 131/62 | HR 41 | Ht 70.0 in | Wt 156.0 lb

## 2023-10-09 DIAGNOSIS — Z09 Encounter for follow-up examination after completed treatment for conditions other than malignant neoplasm: Secondary | ICD-10-CM | POA: Diagnosis not present

## 2023-10-09 DIAGNOSIS — Z992 Dependence on renal dialysis: Secondary | ICD-10-CM

## 2023-10-09 DIAGNOSIS — I10 Essential (primary) hypertension: Secondary | ICD-10-CM | POA: Diagnosis not present

## 2023-10-09 DIAGNOSIS — I120 Hypertensive chronic kidney disease with stage 5 chronic kidney disease or end stage renal disease: Secondary | ICD-10-CM | POA: Diagnosis not present

## 2023-10-09 DIAGNOSIS — N186 End stage renal disease: Secondary | ICD-10-CM

## 2023-10-09 DIAGNOSIS — Z8719 Personal history of other diseases of the digestive system: Secondary | ICD-10-CM | POA: Diagnosis not present

## 2023-10-09 DIAGNOSIS — C61 Malignant neoplasm of prostate: Secondary | ICD-10-CM | POA: Diagnosis not present

## 2023-10-09 DIAGNOSIS — Z8673 Personal history of transient ischemic attack (TIA), and cerebral infarction without residual deficits: Secondary | ICD-10-CM | POA: Diagnosis not present

## 2023-10-09 DIAGNOSIS — I69354 Hemiplegia and hemiparesis following cerebral infarction affecting left non-dominant side: Secondary | ICD-10-CM | POA: Diagnosis not present

## 2023-10-09 DIAGNOSIS — I69392 Facial weakness following cerebral infarction: Secondary | ICD-10-CM | POA: Diagnosis not present

## 2023-10-09 DIAGNOSIS — I480 Paroxysmal atrial fibrillation: Secondary | ICD-10-CM | POA: Diagnosis not present

## 2023-10-09 DIAGNOSIS — E1122 Type 2 diabetes mellitus with diabetic chronic kidney disease: Secondary | ICD-10-CM | POA: Diagnosis not present

## 2023-10-09 DIAGNOSIS — M069 Rheumatoid arthritis, unspecified: Secondary | ICD-10-CM | POA: Diagnosis not present

## 2023-10-09 MED ORDER — AMLODIPINE BESYLATE 5 MG PO TABS
5.0000 mg | ORAL_TABLET | Freq: Every day | ORAL | 1 refills | Status: DC
Start: 2023-10-09 — End: 2024-01-08

## 2023-10-09 NOTE — Assessment & Plan Note (Addendum)
In sinus rhythm currently, rate controlled Was on Eliquis, had to stop due to recurrent GI bleeding Not on rate-controlling agent due to bradycardia On Aspirin now due to recent CVA Follow-up with cardiology for consideration of Watchman device

## 2023-10-09 NOTE — Assessment & Plan Note (Signed)
Gets HD on MWF through HD catheter Planned to see vascular surgery for AV fistula

## 2023-10-09 NOTE — Progress Notes (Signed)
Established Patient Office Visit  Subjective:  Patient ID: Jacob Farmer, male    DOB: 05/19/1946  Age: 77 y.o. MRN: [id number]  CC:  Chief Complaint  Patient presents with   Hospitalization Follow-up    Hospital Follow up     HPI Jacob Farmer is a 77 y.o. male with past medical history of CAD status post stent placement, HTN, HLD, RA, ESRD on HD, prostate ca. s/p radiotherapy, gout, cervical spinal stenosis and GERD who presents for follow-up after recent hospitalization.  He was admitted with sudden onset weakness and numbness of the left arm as well as left facial droop on 09/11/23. He was given TNK to treat his stroke and he experienced improvement in symptoms.  On 9/28, a follow-up CT scan was done showing hemorrhagic transformation of his infarct.  He was carefully monitored in the ICU and transferred out on 9/29 when he was noted to be stable.  He was latter transferred to CIR for continued rehabilitation. Of note, he has h/o A fib and his AC was discontinued in 05/24 due to severe GI bleeding. He had Cardiology evaluation during hospital stay, and they recommend Watchman device if he tolerates short-term AC.  During stay at rehab facility, he was having hypotension especially during HD session.  His hydralazine was discontinued and dose of amlodipine was decreased to 5 mg once daily. His BP is wnl today.  He is currently participating in home PT. he still has mild left leg weakness, but has been feeling better now.  Denies any focal numbness or tingling.   Past Medical History:  Diagnosis Date   Acute CVA (cerebrovascular accident) (HCC) 09/11/2023   CAD (coronary artery disease)    Acute myocardial infarction treated with TPA in 1990; 1999-stents to circumflex and RCA; residual total occlusion of the left anterior descending; normal ejection fraction; stress nuclear in 2001-distal anteroseptal ischemia; normal LV function   CKD (chronic kidney disease) stage 4, GFR 15-29  ml/min (HCC)    First degree heart block    History of gastric ulcer    History of kidney stones    History of MI (myocardial infarction)    1990-  treated w/ TPA   Hyperlipidemia    Hypertension    Jaundice    OA (osteoarthritis)    Prediabetes    Prostate cancer (HCC)    Stage T1c , Gleason 3+4,  PSA 4.7,  vol 24cc--  scheduled for radiative seed implants   Psoriatic arthritis (HCC)    RA (rheumatoid arthritis) (HCC)    Dr. Beatrix Fetters   Sinus bradycardia    Wears dentures     Past Surgical History:  Procedure Laterality Date   BIOPSY  08/27/2019   Procedure: BIOPSY;  Surgeon: Malissa Hippo, MD;  [facility];  Service: Endoscopy;;  gastric   BIOPSY  04/03/2022   Procedure: BIOPSY;  Surgeon: Dolores Frame, MD;  [facility];  Service: Gastroenterology;;   BIOPSY  04/21/2023   Procedure: BIOPSY;  Surgeon: Lanelle Bal, DO;  [facility];  Service: Endoscopy;;   callus removal Left 09/13/2022   left foot   CARDIOVASCULAR STRESS TEST  2001  per Dr Dietrich Pates clinic note   distal anteroseptal ischemia,  normal LVF   COLONOSCOPY  last one 2006 (approx)   COLONOSCOPY WITH PROPOFOL N/A 04/03/2022   Procedure: COLONOSCOPY WITH PROPOFOL;  Surgeon: Dolores Frame, MD;  [facility]: [facility];  Service: Gastroenterology;  Laterality:  N/A;  945   CORONARY ANGIOPLASTY WITH STENT PLACEMENT  1999   DES x2  to CFX and RCA/  residual total occlusion LAD,  normal LVEF   ENTEROSCOPY N/A 04/24/2023   Procedure: ENTEROSCOPY;  Surgeon: Dolores Frame, MD;  [facility];  Service: Gastroenterology;  Laterality: N/A;   ESOPHAGOGASTRODUODENOSCOPY (EGD) WITH PROPOFOL N/A 08/27/2019   Procedure: ESOPHAGOGASTRODUODENOSCOPY (EGD) WITH PROPOFOL;  Surgeon: Malissa Hippo, MD;  [facility];  Service: Endoscopy;  Laterality: N/A;  10:10   ESOPHAGOGASTRODUODENOSCOPY (EGD) WITH PROPOFOL N/A 04/03/2022   Procedure:  ESOPHAGOGASTRODUODENOSCOPY (EGD) WITH PROPOFOL;  Surgeon: Dolores Frame, MD;  [facility];  Service: Gastroenterology;  Laterality: N/A;   ESOPHAGOGASTRODUODENOSCOPY (EGD) WITH PROPOFOL N/A 02/16/2023   Procedure: ESOPHAGOGASTRODUODENOSCOPY (EGD) WITH PROPOFOL;  Surgeon: Dolores Frame, MD;  [facility];  Service: Gastroenterology;  Laterality: N/A;   ESOPHAGOGASTRODUODENOSCOPY (EGD) WITH PROPOFOL N/A 04/21/2023   Procedure: ESOPHAGOGASTRODUODENOSCOPY (EGD) WITH PROPOFOL;  Surgeon: Lanelle Bal, DO;  [facility];  Service: Endoscopy;  Laterality: N/A;   EYE SURGERY Bilateral    cataract   GIVENS CAPSULE STUDY N/A 04/22/2023   Procedure: GIVENS CAPSULE STUDY;  Surgeon: Dolores Frame, MD;  [facility];  Service: Gastroenterology;  Laterality: N/A;   HEMOSTASIS CLIP PLACEMENT  04/24/2023   Procedure: HEMOSTASIS CLIP PLACEMENT;  Surgeon: Dolores Frame, MD;  [facility];  Service: Gastroenterology;;   HOT HEMOSTASIS  04/03/2022   Procedure: HOT HEMOSTASIS (ARGON PLASMA COAGULATION/BICAP);  Surgeon: Marguerita Merles, Reuel Boom, MD;  [facility];  Service: Gastroenterology;;   HOT HEMOSTASIS  04/24/2023   Procedure: HOT HEMOSTASIS (ARGON PLASMA COAGULATION/BICAP);  Surgeon: Marguerita Merles, Reuel Boom, MD;  [facility];  Service: Gastroenterology;;   IR FLUORO GUIDE CV LINE RIGHT  05/30/2023   IR US GUIDE VASC ACCESS RIGHT  05/30/2023   POLYPECTOMY  04/03/2022   Procedure: POLYPECTOMY;  Surgeon: Dolores Frame, MD;  [facility];  Service: Gastroenterology;;   POSTERIOR CERVICAL FUSION/FORAMINOTOMY N/A 05/26/2017   Procedure: RIGHT C4-5 FORAMINOTOMY WITH EXCISION OF HERNIATED DISC;  Surgeon: Kerrin Champagne, MD;  Location: [facility];  Service: Orthopedics;  Laterality: N/A;   RADIOACTIVE SEED IMPLANT N/A 01/11/2016   Procedure: RADIOACTIVE SEED IMPLANT/BRACHYTHERAPY  IMPLANT;  Surgeon: Marcine Matar, MD;  Location: [facility];  Service: Urology;  Laterality: N/A;   73  seeds implanted no seeds founds in bladder   SCLEROTHERAPY  04/24/2023   Procedure: SCLEROTHERAPY;  Surgeon: Marguerita Merles, Reuel Boom, MD;  [facility];  Service: Gastroenterology;;   SUBMUCOSAL TATTOO INJECTION  04/24/2023   Procedure: SUBMUCOSAL TATTOO INJECTION;  Surgeon: Marguerita Merles, Reuel Boom, MD;  [facility];  Service: Gastroenterology;;    Family History  Problem Relation Age of Onset   Heart attack Mother    Coronary artery disease Father    Ulcerative colitis Father    Ulcers Father    Breast cancer Sister    COPD Brother    Pancreatic cancer Brother    Healthy Sister    Diabetes Brother    Cirrhosis Son    Seizures Son     Social History   Socioeconomic History   Marital status: Married    Spouse name: Not on file   Number of children: 2   Years of education: Not on file   Highest education level: Not on file  Occupational History   Occupation:  Retired Personnel officer  Tobacco Use   Smoking status: Former    Current packs/day: 0.00    Average packs/day: 1 pack/day for 30.0 years (30.0 ttl pk-yrs)    Types: Cigarettes    Start date: 05/22/1959    Quit date: 05/21/1989    Years since quitting: 34.4    Passive exposure: Never   Smokeless tobacco: Never  Vaping Use   Vaping status: Never Used  Substance and Sexual Activity   Alcohol use: Yes    Comment: 1 drink per day   Drug use: No   Sexual activity: Not on file  Other Topics Concern   Not on file  Social History Narrative   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   Social Determinants of Health   Financial Resource Strain: Low Risk  (02/18/2023)   Overall Financial Resource Strain (CARDIA)    Difficulty of Paying Living Expenses: Not very hard  Food Insecurity: No Food Insecurity (04/28/2023)   Hunger Vital Sign    Worried About Running Out of Food in the  Last Year: Never true    Ran Out of Food in the Last Year: Never true  Transportation Needs: No Transportation Needs (04/28/2023)   PRAPARE - Administrator, Civil Service (Medical): No    Lack of Transportation (Non-Medical): No  Physical Activity: Sufficiently Active (09/14/2021)   Exercise Vital Sign    Days of Exercise per Week: 7 days    Minutes of Exercise per Session: 60 min  Stress: No Stress Concern Present (09/14/2021)   [employer] of Occupational Health - Occupational Stress Questionnaire    Feeling of Stress : Not at all  Social Connections: Unknown (04/30/2022)   [facility]   Social Network    Social Network: Not on file  Intimate Partner Violence: Not At Risk (04/19/2023)   Humiliation, Afraid, Rape, and Kick questionnaire    Fear of Current or Ex-Partner: No    Emotionally Abused: No    Physically Abused: No    Sexually Abused: No    Outpatient Medications Prior to Visit  Medication Sig Dispense Refill   acetaminophen (TYLENOL) 325 MG tablet Take 1-2 tablets (325-650 mg total) by mouth every 4 (four) hours as needed for mild pain.     allopurinol (ZYLOPRIM) 100 MG tablet TAKE 1/2 TABLET EVERY OTHER DAY 23 tablet 0   aspirin EC 81 MG tablet Take 1 tablet (81 mg total) by mouth daily. Swallow whole. 30 tablet 12   calcitRIOL (ROCALTROL) 0.25 MCG capsule Take 0.25 mcg by mouth every Monday, Wednesday, and Friday.     Darbepoetin Alfa (ARANESP) 40 MCG/0.4ML SOSY injection Inject 0.4 mLs (40 mcg total) into the skin every Monday at 6 PM.     Dupilumab (DUPIXENT) 300 MG/2ML SOPN Inject 300 mg into the skin every 14 (fourteen) days. Starting at day 15 for maintenance. 12 mL 1   folic acid (FOLVITE) 400 MCG tablet Take 400 mcg by mouth daily.     leflunomide (ARAVA) 10 MG tablet TAKE 1 TABLET EVERY DAY 90 tablet 0   Leuprolide Acetate (ELIGARD [place]) Inject 1 Dose into the skin every 4 (four) months.     pantoprazole (PROTONIX) 40 MG  tablet TAKE 1 TABLET TWICE DAILY 180 tablet 1   RENVELA 800 MG tablet Take 800 mg by mouth 2 (two) times daily with a meal.     rosuvastatin (CRESTOR) 40 MG tablet Take 1 tablet (40 mg total) by mouth daily. 90  tablet 3   triamcinolone cream (KENALOG) 0.1 % Apply 1 Application topically daily. (Patient taking differently: Apply 1 Application topically daily as needed (irritation).)     amLODipine (NORVASC) 10 MG tablet Take 0.5 tablets (5 mg total) by mouth daily.     No facility-administered medications prior to visit.    No Known Allergies  ROS Review of Systems  Constitutional:  Negative for chills and fever.  HENT:  Negative for congestion and sore throat.   Eyes:  Negative for pain and discharge.  Respiratory:  Negative for cough and shortness of breath.   Cardiovascular:  Negative for chest pain and palpitations.  Gastrointestinal:  Positive for constipation. Negative for diarrhea, nausea and vomiting.  Endocrine: Negative for polydipsia and polyuria.  Genitourinary:  Negative for dysuria and hematuria.  Musculoskeletal:  Positive for arthralgias and back pain. Negative for neck pain and neck stiffness.  Skin:  Positive for rash.  Neurological:  Negative for dizziness, weakness, numbness and headaches.  Psychiatric/Behavioral:  Negative for agitation and behavioral problems.       Objective:    Physical Exam Vitals reviewed.  Constitutional:      General: He is not in acute distress.    Appearance: He is not diaphoretic.  HENT:     Head: Normocephalic and atraumatic.     Nose: Nose normal.     Mouth/Throat:     Mouth: Mucous membranes are moist.  Eyes:     General: No scleral icterus.    Extraocular Movements: Extraocular movements intact.  Cardiovascular:     Rate and Rhythm: Normal rate and regular rhythm.     Heart sounds: Normal heart sounds. No murmur heard. Pulmonary:     Breath sounds: Normal breath sounds. No wheezing or rales.  Abdominal:      Palpations: Abdomen is soft.     Tenderness: There is no abdominal tenderness.  Musculoskeletal:     Cervical back: Neck supple. No tenderness.     Right lower leg: No edema.     Left lower leg: No edema.  Skin:    General: Skin is warm.     Findings: Rash (Diffuse eczematous and dry, scaly patches over LE) present.     Comments: Has soft tissue mass over left side of neck, about 2 cm in diameter, nontender Has HD catheter in place over right chest wall-C/D/I  Neurological:     General: No focal deficit present.     Mental Status: He is alert and oriented to person, place, and time.     Cranial Nerves: No cranial nerve deficit.     Sensory: No sensory deficit.     Motor: No weakness.  Psychiatric:        Mood and Affect: Mood normal.        Behavior: Behavior normal.     BP 131/62 (BP Location: Left Arm, Patient Position: Sitting, Cuff Size: Normal)   Pulse (!) 41   Ht 5\' 10"  (1.778 m)   Wt 156 lb (70.8 kg)   SpO2 94%   BMI 22.38 kg/m  Wt Readings from Last 3 Encounters:  10/09/23 156 lb (70.8 kg)  10/07/23 150 lb (68 kg)  09/30/23 153 lb 12.8 oz (69.8 kg)    Lab Results  Component Value Date   TSH 3.636 02/13/2023   Lab Results  Component Value Date   WBC 10.1 09/24/2023   HGB 9.2 (L) 09/24/2023   HCT 28.0 (L) 09/24/2023   MCV 92.1 09/24/2023  PLT 205 09/24/2023   Lab Results  Component Value Date   NA 133 (L) 09/24/2023   K 4.1 09/24/2023   CO2 27 09/24/2023   GLUCOSE 108 (H) 09/24/2023   BUN 29 (H) 09/24/2023   CREATININE 7.80 (H) 09/24/2023   BILITOT 0.8 09/14/2023   ALKPHOS 77 09/14/2023   AST 86 (H) 09/14/2023   ALT 25 09/14/2023   PROT 6.3 (L) 09/14/2023   ALBUMIN 2.4 (L) 09/24/2023   CALCIUM 8.4 (L) 09/24/2023   ANIONGAP 14 09/24/2023   EGFR 17 (L) 08/21/2023   Lab Results  Component Value Date   CHOL 123 09/12/2023   Lab Results  Component Value Date   HDL 64 09/12/2023   Lab Results  Component Value Date   LDLCALC 41 09/12/2023    Lab Results  Component Value Date   TRIG 92 09/12/2023   Lab Results  Component Value Date   CHOLHDL 1.9 09/12/2023   Lab Results  Component Value Date   HGBA1C 5.3 08/07/2023      Assessment & Plan:   Problem List Items Addressed This Visit       Cardiovascular and Mediastinum   Essential hypertension    BP Readings from Last 1 Encounters:  10/09/23 131/62   Well-controlled with Amlodipine 5 mg once daily, was on 10 mg dose till recently DCed hydralazine as he had hypotension on HD days Counseled for compliance with the medications Advised DASH diet and moderate exercise/walking as tolerated      Relevant Medications   amLODipine (NORVASC) 5 MG tablet   Paroxysmal atrial fibrillation (HCC)    In sinus rhythm currently, rate controlled Was on Eliquis, had to stop due to recurrent GI bleeding Not on rate-controlling agent due to bradycardia On Aspirin now due to recent CVA Follow-up with cardiology for consideration of Watchman device      Relevant Medications   amLODipine (NORVASC) 5 MG tablet     Genitourinary   ESRD on dialysis (HCC)    Gets HD on MWF through HD catheter Planned to see vascular surgery for AV fistula        Other   Hospital discharge follow-up    Hospital chart reviewed, including discharge summary Medications reconciled and reviewed with the patient in detail      History of CVA (cerebrovascular accident) - Primary    In 09/24 Had left-sided hemiplegia, now improved Continue home PT/OT On aspirin and Crestor      H/O: GI bleed    Currently denies melena or hematochezia Unable to take ACE currently due to recent history of GI bleeding related acute blood loss anemia       Meds ordered this encounter  Medications   amLODipine (NORVASC) 5 MG tablet    Sig: Take 1 tablet (5 mg total) by mouth daily.    Dispense:  90 tablet    Refill:  1    Follow-up: Return in about 5 months (around 03/08/2024) for Annual physical.     Anabel Halon, MD

## 2023-10-09 NOTE — Patient Instructions (Signed)
Please continue to take medications as prescribed. ? ?Please continue to follow low salt diet and ambulate as tolerated. ?

## 2023-10-09 NOTE — Assessment & Plan Note (Signed)
Currently denies melena or hematochezia Unable to take ACE currently due to recent history of GI bleeding related acute blood loss anemia

## 2023-10-09 NOTE — Assessment & Plan Note (Signed)
Hospital chart reviewed, including discharge summary Medications reconciled and reviewed with the patient in detail 

## 2023-10-09 NOTE — Assessment & Plan Note (Signed)
In 09/24 Had left-sided hemiplegia, now improved Continue home PT/OT On aspirin and Crestor

## 2023-10-09 NOTE — Assessment & Plan Note (Addendum)
BP Readings from Last 1 Encounters:  10/09/23 131/62   Well-controlled with Amlodipine 5 mg once daily, was on 10 mg dose till recently DCed hydralazine as he had hypotension on HD days Counseled for compliance with the medications Advised DASH diet and moderate exercise/walking as tolerated

## 2023-10-10 DIAGNOSIS — Z992 Dependence on renal dialysis: Secondary | ICD-10-CM | POA: Diagnosis not present

## 2023-10-10 DIAGNOSIS — N186 End stage renal disease: Secondary | ICD-10-CM | POA: Diagnosis not present

## 2023-10-10 DIAGNOSIS — N2581 Secondary hyperparathyroidism of renal origin: Secondary | ICD-10-CM | POA: Diagnosis not present

## 2023-10-13 ENCOUNTER — Telehealth (HOSPITAL_COMMUNITY): Payer: Self-pay | Admitting: *Deleted

## 2023-10-13 ENCOUNTER — Other Ambulatory Visit: Payer: Self-pay | Admitting: Physician Assistant

## 2023-10-13 DIAGNOSIS — N2581 Secondary hyperparathyroidism of renal origin: Secondary | ICD-10-CM | POA: Diagnosis not present

## 2023-10-13 DIAGNOSIS — N186 End stage renal disease: Secondary | ICD-10-CM | POA: Diagnosis not present

## 2023-10-13 DIAGNOSIS — Z992 Dependence on renal dialysis: Secondary | ICD-10-CM | POA: Diagnosis not present

## 2023-10-13 NOTE — Telephone Encounter (Signed)
Received fax from Dr Wolfgang Phoenix requesting new access placement.  Will give to [facility]

## 2023-10-14 ENCOUNTER — Other Ambulatory Visit: Payer: Medicare HMO

## 2023-10-14 DIAGNOSIS — I120 Hypertensive chronic kidney disease with stage 5 chronic kidney disease or end stage renal disease: Secondary | ICD-10-CM | POA: Diagnosis not present

## 2023-10-14 DIAGNOSIS — Z992 Dependence on renal dialysis: Secondary | ICD-10-CM | POA: Diagnosis not present

## 2023-10-14 DIAGNOSIS — I69354 Hemiplegia and hemiparesis following cerebral infarction affecting left non-dominant side: Secondary | ICD-10-CM | POA: Diagnosis not present

## 2023-10-14 DIAGNOSIS — E1122 Type 2 diabetes mellitus with diabetic chronic kidney disease: Secondary | ICD-10-CM | POA: Diagnosis not present

## 2023-10-14 DIAGNOSIS — N186 End stage renal disease: Secondary | ICD-10-CM | POA: Diagnosis not present

## 2023-10-14 DIAGNOSIS — I69392 Facial weakness following cerebral infarction: Secondary | ICD-10-CM | POA: Diagnosis not present

## 2023-10-14 DIAGNOSIS — I480 Paroxysmal atrial fibrillation: Secondary | ICD-10-CM | POA: Diagnosis not present

## 2023-10-14 DIAGNOSIS — C61 Malignant neoplasm of prostate: Secondary | ICD-10-CM

## 2023-10-14 DIAGNOSIS — M069 Rheumatoid arthritis, unspecified: Secondary | ICD-10-CM | POA: Diagnosis not present

## 2023-10-14 NOTE — Telephone Encounter (Signed)
Last Fill: 07/28/2023  Labs: 09/24/2023 CBC RBC 3.04, hemoglobin 9.2, HCT 28.0, 09/17/2023 BMP Sodium 132, Chloride 95, Glucose 140, BUN 44, Creatinine 7.02, Calcium 8.5, GFR 7, 03/28/2023 uric acid 6.7  Next Visit: 11/18/2023  Last Visit: 06/17/2023  DX: Seronegative rheumatoid arthritis   Current Dose per office note 06/17/2023:  allopurinol 50 mg every other day for management of gout.     Okay to refill Allopurinol?

## 2023-10-15 DIAGNOSIS — N186 End stage renal disease: Secondary | ICD-10-CM | POA: Diagnosis not present

## 2023-10-15 DIAGNOSIS — Z992 Dependence on renal dialysis: Secondary | ICD-10-CM | POA: Diagnosis not present

## 2023-10-15 DIAGNOSIS — N2581 Secondary hyperparathyroidism of renal origin: Secondary | ICD-10-CM | POA: Diagnosis not present

## 2023-10-15 LAB — PSA: Prostate Specific Ag, Serum: 1.7 ng/mL (ref 0.0–4.0)

## 2023-10-15 LAB — TESTOSTERONE: Testosterone: 7 ng/dL — ABNORMAL LOW (ref 264–916)

## 2023-10-16 DIAGNOSIS — Z992 Dependence on renal dialysis: Secondary | ICD-10-CM | POA: Diagnosis not present

## 2023-10-16 DIAGNOSIS — I69354 Hemiplegia and hemiparesis following cerebral infarction affecting left non-dominant side: Secondary | ICD-10-CM | POA: Diagnosis not present

## 2023-10-16 DIAGNOSIS — I480 Paroxysmal atrial fibrillation: Secondary | ICD-10-CM | POA: Diagnosis not present

## 2023-10-16 DIAGNOSIS — I120 Hypertensive chronic kidney disease with stage 5 chronic kidney disease or end stage renal disease: Secondary | ICD-10-CM | POA: Diagnosis not present

## 2023-10-16 DIAGNOSIS — M069 Rheumatoid arthritis, unspecified: Secondary | ICD-10-CM | POA: Diagnosis not present

## 2023-10-16 DIAGNOSIS — N186 End stage renal disease: Secondary | ICD-10-CM | POA: Diagnosis not present

## 2023-10-16 DIAGNOSIS — E1122 Type 2 diabetes mellitus with diabetic chronic kidney disease: Secondary | ICD-10-CM | POA: Diagnosis not present

## 2023-10-16 DIAGNOSIS — C61 Malignant neoplasm of prostate: Secondary | ICD-10-CM | POA: Diagnosis not present

## 2023-10-16 DIAGNOSIS — I69392 Facial weakness following cerebral infarction: Secondary | ICD-10-CM | POA: Diagnosis not present

## 2023-10-17 DIAGNOSIS — N186 End stage renal disease: Secondary | ICD-10-CM | POA: Diagnosis not present

## 2023-10-17 DIAGNOSIS — N2581 Secondary hyperparathyroidism of renal origin: Secondary | ICD-10-CM | POA: Diagnosis not present

## 2023-10-17 DIAGNOSIS — Z992 Dependence on renal dialysis: Secondary | ICD-10-CM | POA: Diagnosis not present

## 2023-10-20 DIAGNOSIS — N186 End stage renal disease: Secondary | ICD-10-CM | POA: Diagnosis not present

## 2023-10-20 DIAGNOSIS — N2581 Secondary hyperparathyroidism of renal origin: Secondary | ICD-10-CM | POA: Diagnosis not present

## 2023-10-20 DIAGNOSIS — Z992 Dependence on renal dialysis: Secondary | ICD-10-CM | POA: Diagnosis not present

## 2023-10-20 NOTE — Progress Notes (Signed)
History of Present Illness:   4.23.2024: 77 yo Farmer is here for E/M of elevated PSA--referred by Dr Allena Katz.  PSA in Oct 2022--0.69 PSA 2.16.2024--7.49.  He does have a h/o PCA--  He underwent ultrasound and biopsy of his prostate on 9.27.2016. Prostatic volume 17.6 cc. PSA 4.7. PSA density 0.27.  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   He underwent brachytherapy on 1.26.2017.   Last seen in 2020 for PCa  followup. PSA @ that time 0.20.  He has stage IV CKD.  Followed by Dr. Wolfgang Phoenix.  7.12.2024: PSMA PET scan-- 1. Radiotracer avid left external iliac, left periaortic, and left pelvic lymph nodes compatible with nodal disease involvement. 2. No evidence of local recurrence in the prostate bed. 3. No evidence of radiotracer avid metastatic disease in the chest. 4. Trace bilateral pleural effusions. 5. 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 Recommend follow-up ultrasound every 12 months and vascular consultation. 6.  Aortic Atherosclerosis (ICD10-I70.0).  9.10.2024: Started on LT ADT w/ leuprolide 30 mg.  11.5.2024: He is here today for routine check.  Most recent PSA checked last week is down to 1.7 from 26 which was last checked in April of this year.  Testosterone level 7.  Occasional hot flashes.  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  He has recovered well.  Past Medical History:  Diagnosis Date   Acute CVA (cerebrovascular accident) (HCC) 09/11/2023   CAD (coronary artery disease)    Acute myocardial infarction treated with TPA in 1990; 1999-stents to circumflex and RCA; residual total occlusion of the left anterior descending; normal ejection fraction; stress nuclear in 2001-distal anteroseptal ischemia; normal LV function   CKD (chronic kidney disease) stage 4, GFR 15-29 ml/min (HCC)    First degree heart block    History of gastric ulcer    History of kidney stones     History of MI (myocardial infarction)    1990-  treated w/ TPA   Hyperlipidemia    Hypertension    Jaundice    OA (osteoarthritis)    Prediabetes    Prostate cancer (HCC)    Stage T1c , Gleason 3+4,  PSA 4.7,  vol 24cc--  scheduled for radiative seed implants   Psoriatic arthritis (HCC)    RA (rheumatoid arthritis) (HCC)    Dr. Beatrix Fetters   Sinus bradycardia    Wears dentures     Past Surgical History:  Procedure Laterality Date   BIOPSY  08/27/2019   Procedure: BIOPSY;  Surgeon: Malissa Hippo, MD;  [facility];  Service: Endoscopy;;  gastric   BIOPSY  04/03/2022   Procedure: BIOPSY;  Surgeon: Dolores Frame, MD;  [facility];  Service: Gastroenterology;;   BIOPSY  04/21/2023   Procedure: BIOPSY;  Surgeon: Lanelle Bal, DO;  [facility];  Service: Endoscopy;;   callus removal Left 09/13/2022   left foot   CARDIOVASCULAR STRESS TEST  2001  per Dr Dietrich Pates clinic note   distal anteroseptal ischemia,  normal LVF   COLONOSCOPY  last one 2006 (approx)   COLONOSCOPY WITH PROPOFOL N/A 04/03/2022   Procedure: COLONOSCOPY WITH PROPOFOL;  Surgeon: Dolores Frame, MD;  [facility];  Service: Gastroenterology;  Laterality: N/A;  945   CORONARY ANGIOPLASTY WITH STENT PLACEMENT  1999   DES x2  to CFX and RCA/  residual total occlusion LAD,  normal LVEF   ENTEROSCOPY N/A 04/24/2023   Procedure: ENTEROSCOPY;  Surgeon: Dolores Frame, MD;  [facility];  Service: Gastroenterology;  Laterality: N/A;   ESOPHAGOGASTRODUODENOSCOPY (EGD) WITH PROPOFOL N/A 08/27/2019   Procedure: ESOPHAGOGASTRODUODENOSCOPY (EGD) WITH PROPOFOL;  Surgeon: Malissa Hippo, MD;  [facility];  Service: Endoscopy;  Laterality: N/A;  10:10   ESOPHAGOGASTRODUODENOSCOPY (EGD) WITH PROPOFOL N/A 04/03/2022   Procedure: ESOPHAGOGASTRODUODENOSCOPY (EGD) WITH PROPOFOL;  Surgeon: Dolores Frame, MD;  [facility];  Service: Gastroenterology;  Laterality: N/A;   ESOPHAGOGASTRODUODENOSCOPY (EGD) WITH PROPOFOL N/A 02/16/2023   Procedure: ESOPHAGOGASTRODUODENOSCOPY (EGD) WITH PROPOFOL;  Surgeon: Dolores Frame, MD;  [facility];  Service: Gastroenterology;  Laterality: N/A;   ESOPHAGOGASTRODUODENOSCOPY (EGD) WITH PROPOFOL N/A 04/21/2023   Procedure: ESOPHAGOGASTRODUODENOSCOPY (EGD) WITH PROPOFOL;  Surgeon: Lanelle Bal, DO;  [facility];  Service: Endoscopy;  Laterality: N/A;   EYE SURGERY Bilateral    cataract   GIVENS CAPSULE STUDY N/A 04/22/2023   Procedure: GIVENS CAPSULE STUDY;  Surgeon: Dolores Frame, MD;  [facility];  Service: Gastroenterology;  Laterality: N/A;   HEMOSTASIS CLIP PLACEMENT  04/24/2023   Procedure: HEMOSTASIS CLIP PLACEMENT;  Surgeon: Dolores Frame, MD;  [facility];  Service: Gastroenterology;;   HOT HEMOSTASIS  04/03/2022   Procedure: HOT HEMOSTASIS (ARGON PLASMA COAGULATION/BICAP);  Surgeon: Marguerita Merles, Reuel Boom, MD;  [facility];  Service: Gastroenterology;;   HOT HEMOSTASIS  04/24/2023   Procedure: HOT HEMOSTASIS (ARGON PLASMA COAGULATION/BICAP);  Surgeon: Marguerita Merles, Reuel Boom, MD;  [facility];  Service: Gastroenterology;;   IR FLUORO GUIDE CV LINE RIGHT  05/30/2023   IR US GUIDE VASC ACCESS RIGHT  05/30/2023   POLYPECTOMY  04/03/2022   Procedure: POLYPECTOMY;  Surgeon: Dolores Frame, MD;  [facility];  Service: Gastroenterology;;   POSTERIOR CERVICAL FUSION/FORAMINOTOMY N/A 05/26/2017   Procedure: RIGHT C4-5 FORAMINOTOMY WITH EXCISION OF HERNIATED DISC;  Surgeon: Kerrin Champagne, MD;  Location: [facility];  Service: Orthopedics;  Laterality: N/A;   RADIOACTIVE SEED IMPLANT N/A 01/11/2016   Procedure: RADIOACTIVE SEED IMPLANT/BRACHYTHERAPY IMPLANT;  Surgeon: Marcine Matar, MD;  [facility];  Service: Urology;   Laterality: N/A;   73  seeds implanted no seeds founds in bladder   SCLEROTHERAPY  04/24/2023   Procedure: SCLEROTHERAPY;  Surgeon: Marguerita Merles, Reuel Boom, MD;  [facility];  Service: Gastroenterology;;   SUBMUCOSAL TATTOO INJECTION  04/24/2023   Procedure: SUBMUCOSAL TATTOO INJECTION;  Surgeon: Dolores Frame, MD;  [facility];  Service: Gastroenterology;;    Home Medications:  Allergies as of 10/21/2023   No Known Allergies      Medication List        Accurate as of October 20, 2023  3:00 PM. If you have any questions, ask your nurse or doctor.          acetaminophen 325 MG tablet Commonly known as: TYLENOL Take 1-2 tablets (325-650 mg total) by mouth every 4 (four) hours as needed for mild pain.   allopurinol 100 MG tablet Commonly known as: ZYLOPRIM TAKE 1/2 TABLET EVERY OTHER DAY   amLODipine 5 MG tablet Commonly known as: NORVASC Take 1 tablet (5 mg total) by mouth daily.   aspirin EC 81 MG tablet Take 1 tablet (81 mg total) by mouth daily. Swallow whole.   calcitRIOL 0.25 MCG capsule Commonly known as: ROCALTROL Take 0.25 mcg by  mouth every Monday, Wednesday, and Friday.   Darbepoetin Alfa 40 MCG/0.4ML Sosy injection Commonly known as: ARANESP Inject 0.4 mLs (40 mcg total) into the skin every Monday at 6 PM.   Dupixent 300 MG/2ML Soaj Generic drug: Dupilumab Inject 300 mg into the skin every 14 (fourteen) days. Starting at day 15 for maintenance.   ELIGARD Clinch Inject 1 Dose into the skin every 4 (four) months.   folic acid 400 MCG tablet Commonly known as: FOLVITE Take 400 mcg by mouth daily.   leflunomide 10 MG tablet Commonly known as: ARAVA TAKE 1 TABLET EVERY DAY   pantoprazole 40 MG tablet Commonly known as: PROTONIX TAKE 1 TABLET TWICE DAILY   Renvela 800 MG tablet Generic drug: sevelamer carbonate Take 800 mg by mouth 2 (two) times daily with a meal.   rosuvastatin 40 MG tablet Commonly known as:  CRESTOR Take 1 tablet (40 mg total) by mouth daily.   triamcinolone cream 0.1 % Commonly known as: KENALOG Apply 1 Application topically daily. What changed:  when to take this reasons to take this        Allergies: No Known Allergies  Family History  Problem Relation Age of Onset   Heart attack Mother    Coronary artery disease Father    Ulcerative colitis Father    Ulcers Father    Breast cancer Sister    COPD Brother    Pancreatic cancer Brother    Healthy Sister    Diabetes Brother    Cirrhosis Son    Seizures Son     Social History:  reports that he quit smoking about 34 years ago. His smoking use included cigarettes. He started smoking about Jacob years ago. He has a 30 pack-year smoking history. He has never been exposed to tobacco smoke. He has never used smokeless tobacco. He reports current alcohol use. He reports that he does not use drugs.  ROS: A complete review of systems was performed.  All systems are negative except for pertinent findings as noted.  Physical Exam:  Vital signs in last 24 hours: There were no vitals taken for this visit. 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  Now on LT ADT which he tolerates well.  Excellent PSA response thus far.  He did have a CVA 2 weeks after initiation of his ADT  Plan:  I will have him come back in a couple of months for his next Lupron injection-nurse visit coupled with my office visit following laboratories

## 2023-10-21 ENCOUNTER — Ambulatory Visit: Payer: Medicare HMO | Admitting: Urology

## 2023-10-21 VITALS — BP 99/62 | HR 80 | Ht 70.0 in | Wt 156.0 lb

## 2023-10-21 DIAGNOSIS — I69354 Hemiplegia and hemiparesis following cerebral infarction affecting left non-dominant side: Secondary | ICD-10-CM | POA: Diagnosis not present

## 2023-10-21 DIAGNOSIS — C61 Malignant neoplasm of prostate: Secondary | ICD-10-CM | POA: Diagnosis not present

## 2023-10-21 DIAGNOSIS — Z8546 Personal history of malignant neoplasm of prostate: Secondary | ICD-10-CM | POA: Diagnosis not present

## 2023-10-21 DIAGNOSIS — E1122 Type 2 diabetes mellitus with diabetic chronic kidney disease: Secondary | ICD-10-CM | POA: Diagnosis not present

## 2023-10-21 DIAGNOSIS — I69392 Facial weakness following cerebral infarction: Secondary | ICD-10-CM | POA: Diagnosis not present

## 2023-10-21 DIAGNOSIS — Z992 Dependence on renal dialysis: Secondary | ICD-10-CM | POA: Diagnosis not present

## 2023-10-21 DIAGNOSIS — R9721 Rising PSA following treatment for malignant neoplasm of prostate: Secondary | ICD-10-CM

## 2023-10-21 DIAGNOSIS — I480 Paroxysmal atrial fibrillation: Secondary | ICD-10-CM | POA: Diagnosis not present

## 2023-10-21 DIAGNOSIS — I120 Hypertensive chronic kidney disease with stage 5 chronic kidney disease or end stage renal disease: Secondary | ICD-10-CM | POA: Diagnosis not present

## 2023-10-21 DIAGNOSIS — N186 End stage renal disease: Secondary | ICD-10-CM | POA: Diagnosis not present

## 2023-10-21 DIAGNOSIS — M069 Rheumatoid arthritis, unspecified: Secondary | ICD-10-CM | POA: Diagnosis not present

## 2023-10-21 LAB — URINALYSIS, ROUTINE W REFLEX MICROSCOPIC
Bilirubin, UA: NEGATIVE
Ketones, UA: NEGATIVE
Leukocytes,UA: NEGATIVE
Nitrite, UA: NEGATIVE
Specific Gravity, UA: 1.015 (ref 1.005–1.030)
Urobilinogen, Ur: 0.2 mg/dL (ref 0.2–1.0)
pH, UA: 8.5 — ABNORMAL HIGH (ref 5.0–7.5)

## 2023-10-21 LAB — MICROSCOPIC EXAMINATION: Bacteria, UA: NONE SEEN

## 2023-10-22 DIAGNOSIS — N2581 Secondary hyperparathyroidism of renal origin: Secondary | ICD-10-CM | POA: Diagnosis not present

## 2023-10-22 DIAGNOSIS — Z992 Dependence on renal dialysis: Secondary | ICD-10-CM | POA: Diagnosis not present

## 2023-10-22 DIAGNOSIS — N186 End stage renal disease: Secondary | ICD-10-CM | POA: Diagnosis not present

## 2023-10-24 DIAGNOSIS — N2581 Secondary hyperparathyroidism of renal origin: Secondary | ICD-10-CM | POA: Diagnosis not present

## 2023-10-24 DIAGNOSIS — Z992 Dependence on renal dialysis: Secondary | ICD-10-CM | POA: Diagnosis not present

## 2023-10-24 DIAGNOSIS — N186 End stage renal disease: Secondary | ICD-10-CM | POA: Diagnosis not present

## 2023-10-27 DIAGNOSIS — N2581 Secondary hyperparathyroidism of renal origin: Secondary | ICD-10-CM | POA: Diagnosis not present

## 2023-10-27 DIAGNOSIS — Z992 Dependence on renal dialysis: Secondary | ICD-10-CM | POA: Diagnosis not present

## 2023-10-27 DIAGNOSIS — N186 End stage renal disease: Secondary | ICD-10-CM | POA: Diagnosis not present

## 2023-10-28 ENCOUNTER — Encounter: Payer: Self-pay | Admitting: Physical Medicine & Rehabilitation

## 2023-10-28 ENCOUNTER — Encounter: Payer: Medicare HMO | Attending: Physical Medicine & Rehabilitation | Admitting: Physical Medicine & Rehabilitation

## 2023-10-28 VITALS — BP 132/81 | HR 72 | Ht 70.0 in | Wt 155.0 lb

## 2023-10-28 DIAGNOSIS — I6389 Other cerebral infarction: Secondary | ICD-10-CM | POA: Insufficient documentation

## 2023-10-28 DIAGNOSIS — E1122 Type 2 diabetes mellitus with diabetic chronic kidney disease: Secondary | ICD-10-CM | POA: Diagnosis not present

## 2023-10-28 DIAGNOSIS — N186 End stage renal disease: Secondary | ICD-10-CM | POA: Diagnosis not present

## 2023-10-28 DIAGNOSIS — Z992 Dependence on renal dialysis: Secondary | ICD-10-CM | POA: Diagnosis not present

## 2023-10-28 DIAGNOSIS — I69392 Facial weakness following cerebral infarction: Secondary | ICD-10-CM | POA: Diagnosis not present

## 2023-10-28 DIAGNOSIS — I480 Paroxysmal atrial fibrillation: Secondary | ICD-10-CM | POA: Diagnosis not present

## 2023-10-28 DIAGNOSIS — I69354 Hemiplegia and hemiparesis following cerebral infarction affecting left non-dominant side: Secondary | ICD-10-CM | POA: Diagnosis not present

## 2023-10-28 DIAGNOSIS — C61 Malignant neoplasm of prostate: Secondary | ICD-10-CM | POA: Diagnosis not present

## 2023-10-28 DIAGNOSIS — M069 Rheumatoid arthritis, unspecified: Secondary | ICD-10-CM | POA: Diagnosis not present

## 2023-10-28 DIAGNOSIS — I120 Hypertensive chronic kidney disease with stage 5 chronic kidney disease or end stage renal disease: Secondary | ICD-10-CM | POA: Diagnosis not present

## 2023-10-28 NOTE — Progress Notes (Signed)
Subjective:    Patient ID: Jacob Farmer, male    DOB: 29-Jan-1946, 77 y.o.   MRN: [id number] Admit date: 09/16/2023 Discharge date: 09/25/2023  77 y.o. male who presented to the ED on 09/11/2023 complaining of left sided weakness, left arm numbness and left facial droop. He was given TNK to treat his stroke and he experienced improvement in symptoms. A follow-up CT scan on 9/28 hemorrhagic transformation of his infarct was noted. He was monitored in the ICU and transferred out on 9/29. Follow-up CT head without change in the appearance of the hemorrhagic infarction in the right posterior frontal vertex. As part of his workup, he underwent 2D echo with ejection fraction of 60 to 65% and grade 1 diastolic dysfunction. Hemoglobin A1c 5.3%. He was started on heparin subcutaneously for VTE prophylaxis. He is now on aspirin 81 mg daily. Past medical history significant for atrial fibrillation and cardiology was consulted during this admission. They are considering Watchman device if the patient is able to tolerate short-term anticoagulation. He will follow-up as an outpatient. His past medical history is also significant for recent gastrointestinal bleed in May. 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 Platelet use discussed with GI service and okayed to start aspirin 81 mg daily. The patient is maintained on amlodipine 10 mg daily and hydralazine 25 mg twice daily for hypertension. The patient undergoes hemodialysis via tunneled dialysis catheter on Mondays Wednesdays and Fridays. Nephrology will continue to follow. Medications for rheumatoid arthritis continue. He is noted loose stools and testing for C. difficile toxin is negative. Currently receiving Loperamide 2 mg every 8 hours. Is able to complete supine to sit with supervision and use of hospital bed features yesterday. He is self-limiting with  gait distance is secondary to decreased balance, fatigue and incontinent bowel movement.      HPI  ESRD on HD 3 d per week  Asking to drive  No falls since d/c  Mod I ADLs still slow with buttoning with Right hand  Patient without current pain complaints is able ambulate steps his walking tolerance is 5 minutes he is married lives with his wife  He has driven his motorized cart to do some yard work in his backyard. Pain Inventory Average Pain 2 Pain Right Now 0 My pain is constant and dull  LOCATION OF PAIN  back  BOWEL Number of stools per week: 7 Oral laxative use No    BLADDER Dialysis    Mobility walk without assistance how many minutes can you walk? 5 minutes ability to climb steps?  yes do you drive?  no  Function retired  Neuro/Psych No problems in this area  Prior Studies Any changes since last visit?  no  Physicians involved in your care Any changes since last visit?  no   Family History  Problem Relation Age of Onset   Heart attack Mother    Coronary artery disease Father    Ulcerative colitis Father    Ulcers Father    Breast cancer Sister    COPD Brother    Pancreatic cancer Brother    Healthy Sister    Diabetes Brother    Cirrhosis Son    Seizures Son    Social History   Socioeconomic History   Marital status: Married    Spouse name: Not on file   Number of children: 2  Years of education: Not on file   Highest education level: Not on file  Occupational History   Occupation: Retired Personnel officer  Tobacco Use   Smoking status: Former    Current packs/day: 0.00    Average packs/day: 1 pack/day for 30.0 years (30.0 ttl pk-yrs)    Types: Cigarettes    Start date: 05/22/1959    Quit date: 05/21/1989    Years since quitting: 34.4    Passive exposure: Never   Smokeless tobacco: Never  Vaping Use   Vaping status: Never Used  Substance and Sexual Activity   Alcohol use: Yes    Comment: 1 drink per day   Drug use: No   Sexual  activity: Not on file  Other Topics Concern   Not on file  Social History Narrative   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   Social Determinants of Health   Financial Resource Strain: Low Risk  (02/18/2023)   Overall Financial Resource Strain (CARDIA)    Difficulty of Paying Living Expenses: Not very hard  Food Insecurity: No Food Insecurity (04/28/2023)   Hunger Vital Sign    Worried About Running Out of Food in the Last Year: Never true    Ran Out of Food in the Last Year: Never true  Transportation Needs: No Transportation Needs (04/28/2023)   PRAPARE - Administrator, Civil Service (Medical): No    Lack of Transportation (Non-Medical): No  Physical Activity: Sufficiently Active (09/14/2021)   Exercise Vital Sign    Days of Exercise per Week: 7 days    Minutes of Exercise per Session: 60 min  Stress: No Stress Concern Present (09/14/2021)   [employer] of Occupational Health - Occupational Stress Questionnaire    Feeling of Stress : Not at all  Social Connections: Unknown (04/30/2022)   [facility]   Social Network    Social Network: Not on file   Past Surgical History:  Procedure Laterality Date   BIOPSY  08/27/2019   Procedure: BIOPSY;  Surgeon: Malissa Hippo, MD;  [facility];  Service: Endoscopy;;  gastric   BIOPSY  04/03/2022   Procedure: BIOPSY;  Surgeon: Dolores Frame, MD;  [facility];  Service: Gastroenterology;;   BIOPSY  04/21/2023   Procedure: BIOPSY;  Surgeon: Lanelle Bal, DO;  [facility];  Service: Endoscopy;;   callus removal Left 09/13/2022   left foot   CARDIOVASCULAR STRESS TEST  2001  per Dr Dietrich Pates clinic note   distal anteroseptal ischemia,  normal LVF   COLONOSCOPY  last one 2006 (approx)   COLONOSCOPY WITH PROPOFOL N/A 04/03/2022   Procedure: COLONOSCOPY WITH PROPOFOL;  Surgeon: Dolores Frame, MD;  [facility];  Service:  Gastroenterology;  Laterality: N/A;  945   CORONARY ANGIOPLASTY WITH STENT PLACEMENT  1999   DES x2  to CFX and RCA/  residual total occlusion LAD,  normal LVEF   ENTEROSCOPY N/A 04/24/2023   Procedure: ENTEROSCOPY;  Surgeon: Dolores Frame, MD;  [facility];  Service: Gastroenterology;  Laterality: N/A;   ESOPHAGOGASTRODUODENOSCOPY (EGD) WITH PROPOFOL N/A 08/27/2019   Procedure: ESOPHAGOGASTRODUODENOSCOPY (EGD) WITH PROPOFOL;  Surgeon: Malissa Hippo, MD;  [facility];  Service: Endoscopy;  Laterality: N/A;  10:10   ESOPHAGOGASTRODUODENOSCOPY (EGD) WITH PROPOFOL N/A 04/03/2022   Procedure: ESOPHAGOGASTRODUODENOSCOPY (EGD) WITH PROPOFOL;  Surgeon: Dolores Frame, MD;  [facility];  Service: Gastroenterology;  Laterality: N/A;  ESOPHAGOGASTRODUODENOSCOPY (EGD) WITH PROPOFOL N/A 02/16/2023   Procedure: ESOPHAGOGASTRODUODENOSCOPY (EGD) WITH PROPOFOL;  Surgeon: Dolores Frame, MD;  [facility];  Service: Gastroenterology;  Laterality: N/A;   ESOPHAGOGASTRODUODENOSCOPY (EGD) WITH PROPOFOL N/A 04/21/2023   Procedure: ESOPHAGOGASTRODUODENOSCOPY (EGD) WITH PROPOFOL;  Surgeon: Lanelle Bal, DO;  [facility];  Service: Endoscopy;  Laterality: N/A;   EYE SURGERY Bilateral    cataract   GIVENS CAPSULE STUDY N/A 04/22/2023   Procedure: GIVENS CAPSULE STUDY;  Surgeon: Dolores Frame, MD;  [facility];  Service: Gastroenterology;  Laterality: N/A;   HEMOSTASIS CLIP PLACEMENT  04/24/2023   Procedure: HEMOSTASIS CLIP PLACEMENT;  Surgeon: Dolores Frame, MD;  [facility];  Service: Gastroenterology;;   HOT HEMOSTASIS  04/03/2022   Procedure: HOT HEMOSTASIS (ARGON PLASMA COAGULATION/BICAP);  Surgeon: Marguerita Merles, Reuel Boom, MD;  [facility];  Service: Gastroenterology;;   HOT HEMOSTASIS  04/24/2023   Procedure: HOT HEMOSTASIS (ARGON PLASMA COAGULATION/BICAP);   Surgeon: Marguerita Merles, Reuel Boom, MD;  [facility];  Service: Gastroenterology;;   IR FLUORO GUIDE CV LINE RIGHT  05/30/2023   IR US GUIDE VASC ACCESS RIGHT  05/30/2023   POLYPECTOMY  04/03/2022   Procedure: POLYPECTOMY;  Surgeon: Dolores Frame, MD;  [facility];  Service: Gastroenterology;;   POSTERIOR CERVICAL FUSION/FORAMINOTOMY N/A 05/26/2017   Procedure: RIGHT C4-5 FORAMINOTOMY WITH EXCISION OF HERNIATED DISC;  Surgeon: Kerrin Champagne, MD;  Location: [facility];  Service: Orthopedics;  Laterality: N/A;   RADIOACTIVE SEED IMPLANT N/A 01/11/2016   Procedure: RADIOACTIVE SEED IMPLANT/BRACHYTHERAPY IMPLANT;  Surgeon: Marcine Matar, MD;  Location: [facility];  Service: Urology;  Laterality: N/A;   73  seeds implanted no seeds founds in bladder   SCLEROTHERAPY  04/24/2023   Procedure: SCLEROTHERAPY;  Surgeon: Marguerita Merles, Reuel Boom, MD;  [facility];  Service: Gastroenterology;;   SUBMUCOSAL TATTOO INJECTION  04/24/2023   Procedure: SUBMUCOSAL TATTOO INJECTION;  Surgeon: Marguerita Merles, Reuel Boom, MD;  [facility];  Service: Gastroenterology;;   Past Medical History:  Diagnosis Date   Acute CVA (cerebrovascular accident) (HCC) 09/11/2023   CAD (coronary artery disease)    Acute myocardial infarction treated with TPA in 1990; 1999-stents to circumflex and RCA; residual total occlusion of the left anterior descending; normal ejection fraction; stress nuclear in 2001-distal anteroseptal ischemia; normal LV function   CKD (chronic kidney disease) stage 4, GFR 15-29 ml/min (HCC)    First degree heart block    History of gastric ulcer    History of kidney stones    History of MI (myocardial infarction)    1990-  treated w/ TPA   Hyperlipidemia    Hypertension    Jaundice    OA (osteoarthritis)    Prediabetes    Prostate cancer (HCC)    Stage T1c , Gleason 3+4,  PSA 4.7,  vol 24cc--  scheduled for radiative seed  implants   Psoriatic arthritis (HCC)    RA (rheumatoid arthritis) (HCC)    Dr. Beatrix Fetters   Sinus bradycardia    Wears dentures    BP (!) 162/82   Pulse 72   Ht 5\' 10"  (1.778 m)   Wt 155 lb (70.3 kg)   SpO2 95%   BMI 22.24 kg/m   Opioid Risk Score:   Fall Risk Score:  `1  Depression screen [facility] 2/9     10/28/2023    2:12 PM 10/09/2023    8:53 AM  10/07/2023    2:03 PM 08/07/2023   10:21 AM 05/01/2023    9:47 AM 03/18/2023    1:51 PM 02/26/2023    1:04 PM  Depression screen PHQ 2/9  Decreased Interest 0 0 0 3 3 3  0  Down, Depressed, Hopeless 0 0 0 0 0 0 0  PHQ - 2 Score 0 0 0 3 3 3  0  Altered sleeping  0 2 0 0 0   Tired, decreased energy  0 2 3 3 3    Change in appetite  0 0 0 0 3   Feeling bad or failure about yourself   0 0 0 0 0   Trouble concentrating  0 0 0 0 0   Moving slowly or fidgety/restless  0 0 3 3 0   Suicidal thoughts  0 0 0 0 0   PHQ-9 Score  0 4 9 9 9    Difficult doing work/chores  Not difficult at all Not difficult at all Not difficult at all Not difficult at all Not difficult at all     Review of Systems     Objective:   Physical Exam General No acute distress Mood and affect appropriate No evidence of dysarthria or aphasia Motor strength is 4+ in the left deltoid bicep tricep grip hip flexor knee extensor ankle dorsiflexor 5/5 in the right deltoid, bicep, tricep, grip, hip flexor, knee extensor, ankle dorsiflexion plantarflexion Negative straight leg raise Intact finger to thumb opposition although slightly slower on the left side compared to the right side Mild dysdiadochokinesis with rapid alternating supination pronation of the left upper extremity  Ambulates with a cane no evidence of toe drag or knee instability Visual fields are intact confrontation testing Extraocular muscles are intact No evidence of nystagmus Cervical spine range of motion is normal Upper extremity range of motion is normal    Assessment & Plan:   1.  Right posterior  frontal hemorrhagic infarct with excellent functional recovery still has some mild left-sided weakness but is not really impairing the patient from a functional standpoint. I do think he can return to driving in a graduated fashion as outlined below Follow-up with neurology Follow-up primary care Graduated return to driving instructions were provided. It is recommended that the patient first drives with another licensed driver in an empty parking lot. If the patient does well with this, and they can drive on a quiet street with the licensed driver. If the patient does well with this they can drive on a busy street with a licensed driver.  Hold off on independent driving until cleared by neurology.

## 2023-10-28 NOTE — Patient Instructions (Signed)
Graduated return to driving instructions were provided. It is recommended that the patient first drives with another licensed driver in an empty parking lot. If the patient does well with this, and they can drive on a quiet street with the licensed driver. If the patient does well with this they can drive on a busy street with a licensed driver. If the patient does well with this, the next time out they can go by himself. For the first month after resuming driving, I recommend no nighttime or Interstate driving.   

## 2023-10-29 DIAGNOSIS — Z992 Dependence on renal dialysis: Secondary | ICD-10-CM | POA: Diagnosis not present

## 2023-10-29 DIAGNOSIS — N2581 Secondary hyperparathyroidism of renal origin: Secondary | ICD-10-CM | POA: Diagnosis not present

## 2023-10-29 DIAGNOSIS — N186 End stage renal disease: Secondary | ICD-10-CM | POA: Diagnosis not present

## 2023-10-31 DIAGNOSIS — N2581 Secondary hyperparathyroidism of renal origin: Secondary | ICD-10-CM | POA: Diagnosis not present

## 2023-10-31 DIAGNOSIS — N186 End stage renal disease: Secondary | ICD-10-CM | POA: Diagnosis not present

## 2023-10-31 DIAGNOSIS — Z992 Dependence on renal dialysis: Secondary | ICD-10-CM | POA: Diagnosis not present

## 2023-11-03 DIAGNOSIS — N2581 Secondary hyperparathyroidism of renal origin: Secondary | ICD-10-CM | POA: Diagnosis not present

## 2023-11-03 DIAGNOSIS — N186 End stage renal disease: Secondary | ICD-10-CM | POA: Diagnosis not present

## 2023-11-03 DIAGNOSIS — Z992 Dependence on renal dialysis: Secondary | ICD-10-CM | POA: Diagnosis not present

## 2023-11-04 DIAGNOSIS — I480 Paroxysmal atrial fibrillation: Secondary | ICD-10-CM | POA: Diagnosis not present

## 2023-11-04 DIAGNOSIS — M069 Rheumatoid arthritis, unspecified: Secondary | ICD-10-CM | POA: Diagnosis not present

## 2023-11-04 DIAGNOSIS — I69392 Facial weakness following cerebral infarction: Secondary | ICD-10-CM | POA: Diagnosis not present

## 2023-11-04 DIAGNOSIS — I120 Hypertensive chronic kidney disease with stage 5 chronic kidney disease or end stage renal disease: Secondary | ICD-10-CM | POA: Diagnosis not present

## 2023-11-04 DIAGNOSIS — N186 End stage renal disease: Secondary | ICD-10-CM | POA: Diagnosis not present

## 2023-11-04 DIAGNOSIS — I69354 Hemiplegia and hemiparesis following cerebral infarction affecting left non-dominant side: Secondary | ICD-10-CM | POA: Diagnosis not present

## 2023-11-04 DIAGNOSIS — Z992 Dependence on renal dialysis: Secondary | ICD-10-CM | POA: Diagnosis not present

## 2023-11-04 DIAGNOSIS — C61 Malignant neoplasm of prostate: Secondary | ICD-10-CM | POA: Diagnosis not present

## 2023-11-04 DIAGNOSIS — E1122 Type 2 diabetes mellitus with diabetic chronic kidney disease: Secondary | ICD-10-CM | POA: Diagnosis not present

## 2023-11-05 ENCOUNTER — Ambulatory Visit: Payer: Medicare HMO

## 2023-11-05 VITALS — Ht 70.0 in | Wt 155.0 lb

## 2023-11-05 DIAGNOSIS — N186 End stage renal disease: Secondary | ICD-10-CM | POA: Diagnosis not present

## 2023-11-05 DIAGNOSIS — Z0000 Encounter for general adult medical examination without abnormal findings: Secondary | ICD-10-CM | POA: Diagnosis not present

## 2023-11-05 DIAGNOSIS — Z992 Dependence on renal dialysis: Secondary | ICD-10-CM | POA: Diagnosis not present

## 2023-11-05 DIAGNOSIS — N2581 Secondary hyperparathyroidism of renal origin: Secondary | ICD-10-CM | POA: Diagnosis not present

## 2023-11-05 NOTE — Progress Notes (Signed)
Subjective:   Jacob Farmer is a 77 y.o. male who presents for Medicare Annual/Subsequent preventive examination.  Visit Complete: Virtual I connected with  [name] on 11/05/23 by a audio enabled telemedicine application and verified that I am speaking with the correct person using two identifiers.  Patient Location: Home  Provider Location: Home Office  I discussed the limitations of evaluation and management by telemedicine. The patient expressed understanding and agreed to proceed.  Vital Signs: Because this visit was a virtual/telehealth visit, some criteria may be missing or patient reported. Any vitals not documented were not able to be obtained and vitals that have been documented are patient reported.  Cardiac Risk Factors include: advanced age (>70men, >45 women);diabetes mellitus;dyslipidemia;hypertension;male gender;smoking/ tobacco exposure     Objective:    Today's Vitals   11/05/23 0842  Weight: 155 lb (70.3 kg)  Height: 5\' 10"  (1.778 m)   Body mass index is 22.24 kg/m.     11/05/2023    9:21 AM 09/16/2023    3:38 PM 09/11/2023    9:39 PM 05/30/2023    1:12 PM 04/24/2023    8:54 AM 04/21/2023    1:41 PM 04/19/2023   10:30 PM  Advanced Directives  Does Patient Have a Medical Advance Directive? No No No Yes Yes Yes Yes  Type of Advance Directive    Living will Living will Living will Living will  Does patient want to make changes to medical advance directive?    No - Patient declined   No - Patient declined  Would patient like information on creating a medical advance directive? Yes (MAU/Ambulatory/Procedural Areas - Information given) No - Patient declined No - Patient declined        Current Medications (verified) Outpatient Encounter Medications as of 11/05/2023  Medication Sig   acetaminophen (TYLENOL) 325 MG tablet Take 1-2 tablets (325-650 mg total) by mouth every 4 (four) hours as needed for mild pain.   allopurinol (ZYLOPRIM) 100 MG tablet TAKE 1/2  TABLET EVERY OTHER DAY   amLODipine (NORVASC) 5 MG tablet Take 1 tablet (5 mg total) by mouth daily.   aspirin EC 81 MG tablet Take 1 tablet (81 mg total) by mouth daily. Swallow whole.   calcitRIOL (ROCALTROL) 0.25 MCG capsule Take 0.25 mcg by mouth every Monday, Wednesday, and Friday.   Darbepoetin Alfa (ARANESP) 40 MCG/0.4ML SOSY injection Inject 0.4 mLs (40 mcg total) into the skin every Monday at 6 PM.   Dupilumab (DUPIXENT) 300 MG/2ML SOPN Inject 300 mg into the skin every 14 (fourteen) days. Starting at day 15 for maintenance.   folic acid (FOLVITE) 400 MCG tablet Take 400 mcg by mouth daily.   leflunomide (ARAVA) 10 MG tablet TAKE 1 TABLET EVERY DAY   Leuprolide Acetate (ELIGARD Leon) Inject 1 Dose into the skin every 4 (four) months.   pantoprazole (PROTONIX) 40 MG tablet TAKE 1 TABLET TWICE DAILY   RENVELA 800 MG tablet Take 800 mg by mouth 2 (two) times daily with a meal.   rosuvastatin (CRESTOR) 40 MG tablet Take 1 tablet (40 mg total) by mouth daily.   triamcinolone cream (KENALOG) 0.1 % Apply 1 Application topically daily. (Patient taking differently: Apply 1 Application topically daily as needed (irritation).)   No facility-administered encounter medications on file as of 11/05/2023.    Allergies (verified) Patient has no known allergies.   History: Past Medical History:  Diagnosis Date   Acute CVA (cerebrovascular accident) (HCC) 09/11/2023   CAD (coronary artery  disease)    Acute myocardial infarction treated with TPA in 1990; 1999-stents to circumflex and RCA; residual total occlusion of the left anterior descending; normal ejection fraction; stress nuclear in 2001-distal anteroseptal ischemia; normal LV function   CKD (chronic kidney disease) stage 4, GFR 15-29 ml/min (HCC)    First degree heart block    History of gastric ulcer    History of kidney stones    History of MI (myocardial infarction)    1990-  treated w/ TPA   Hyperlipidemia    Hypertension    Jaundice     OA (osteoarthritis)    Prediabetes    Prostate cancer (HCC)    Stage T1c , Gleason 3+4,  PSA 4.7,  vol 24cc--  scheduled for radiative seed implants   Psoriatic arthritis (HCC)    RA (rheumatoid arthritis) (HCC)    Dr. Beatrix Fetters   Sinus bradycardia    Wears dentures    Past Surgical History:  Procedure Laterality Date   BIOPSY  08/27/2019   Procedure: BIOPSY;  Surgeon: Malissa Hippo, MD;  [facility];  Service: Endoscopy;;  gastric   BIOPSY  04/03/2022   Procedure: BIOPSY;  Surgeon: Dolores Frame, MD;  [facility];  Service: Gastroenterology;;   BIOPSY  04/21/2023   Procedure: BIOPSY;  Surgeon: Lanelle Bal, DO;  [facility];  Service: Endoscopy;;   callus removal Left 09/13/2022   left foot   CARDIOVASCULAR STRESS TEST  2001  per Dr Dietrich Pates clinic note   distal anteroseptal ischemia,  normal LVF   COLONOSCOPY  last one 2006 (approx)   COLONOSCOPY WITH PROPOFOL N/A 04/03/2022   Procedure: COLONOSCOPY WITH PROPOFOL;  Surgeon: Dolores Frame, MD;  [facility];  Service: Gastroenterology;  Laterality: N/A;  945   CORONARY ANGIOPLASTY WITH STENT PLACEMENT  1999   DES x2  to CFX and RCA/  residual total occlusion LAD,  normal LVEF   ENTEROSCOPY N/A 04/24/2023   Procedure: ENTEROSCOPY;  Surgeon: Dolores Frame, MD;  [facility];  Service: Gastroenterology;  Laterality: N/A;   ESOPHAGOGASTRODUODENOSCOPY (EGD) WITH PROPOFOL N/A 08/27/2019   Procedure: ESOPHAGOGASTRODUODENOSCOPY (EGD) WITH PROPOFOL;  Surgeon: Malissa Hippo, MD;  [facility];  Service: Endoscopy;  Laterality: N/A;  10:10   ESOPHAGOGASTRODUODENOSCOPY (EGD) WITH PROPOFOL N/A 04/03/2022   Procedure: ESOPHAGOGASTRODUODENOSCOPY (EGD) WITH PROPOFOL;  Surgeon: Dolores Frame, MD;  [facility];  Service: Gastroenterology;  Laterality: N/A;   ESOPHAGOGASTRODUODENOSCOPY (EGD) WITH PROPOFOL N/A  02/16/2023   Procedure: ESOPHAGOGASTRODUODENOSCOPY (EGD) WITH PROPOFOL;  Surgeon: Dolores Frame, MD;  [facility];  Service: Gastroenterology;  Laterality: N/A;   ESOPHAGOGASTRODUODENOSCOPY (EGD) WITH PROPOFOL N/A 04/21/2023   Procedure: ESOPHAGOGASTRODUODENOSCOPY (EGD) WITH PROPOFOL;  Surgeon: Lanelle Bal, DO;  [facility];  Service: Endoscopy;  Laterality: N/A;   EYE SURGERY Bilateral    cataract   GIVENS CAPSULE STUDY N/A 04/22/2023   Procedure: GIVENS CAPSULE STUDY;  Surgeon: Dolores Frame, MD;  [facility];  Service: Gastroenterology;  Laterality: N/A;   HEMOSTASIS CLIP PLACEMENT  04/24/2023   Procedure: HEMOSTASIS CLIP PLACEMENT;  Surgeon: Dolores Frame, MD;  [facility];  Service: Gastroenterology;;   HOT HEMOSTASIS  04/03/2022   Procedure: HOT HEMOSTASIS (ARGON PLASMA COAGULATION/BICAP);  Surgeon: Marguerita Merles, Reuel Boom, MD;  [facility];  Service: Gastroenterology;;   HOT HEMOSTASIS  04/24/2023   Procedure: HOT HEMOSTASIS (ARGON  PLASMA COAGULATION/BICAP);  Surgeon: Marguerita Merles, Reuel Boom, MD;  [facility];  Service: Gastroenterology;;   IR FLUORO GUIDE CV LINE RIGHT  05/30/2023   IR US GUIDE VASC ACCESS RIGHT  05/30/2023   POLYPECTOMY  04/03/2022   Procedure: POLYPECTOMY;  Surgeon: Dolores Frame, MD;  [facility];  Service: Gastroenterology;;   POSTERIOR CERVICAL FUSION/FORAMINOTOMY N/A 05/26/2017   Procedure: RIGHT C4-5 FORAMINOTOMY WITH EXCISION OF HERNIATED DISC;  Surgeon: Kerrin Champagne, MD;  Location[facility];  Service: Orthopedics;  Laterality: N/A;   RADIOACTIVE SEED IMPLANT N/A 01/11/2016   Procedure: RADIOACTIVE SEED IMPLANT/BRACHYTHERAPY IMPLANT;  Surgeon: Marcine Matar, MD;  Location: [facility];  Service: Urology;  Laterality: N/A;   73  seeds implanted no seeds founds in bladder   SCLEROTHERAPY  04/24/2023   Procedure:  SCLEROTHERAPY;  Surgeon: Marguerita Merles, Reuel Boom, MD;  [facility];  Service: Gastroenterology;;   SUBMUCOSAL TATTOO INJECTION  04/24/2023   Procedure: SUBMUCOSAL TATTOO INJECTION;  Surgeon: Marguerita Merles, Reuel Boom, MD;  [facility];  Service: Gastroenterology;;   Family History  Problem Relation Age of Onset   Heart attack Mother    Coronary artery disease Father    Ulcerative colitis Father    Ulcers Father    Breast cancer Sister    COPD Brother    Pancreatic cancer Brother    Healthy Sister    Diabetes Brother    Cirrhosis Son    Seizures Son    Social History   Socioeconomic History   Marital status: Married    Spouse name: Not on file   Number of children: 2   Years of education: Not on file   Highest education level: Not on file  Occupational History   Occupation: Retired Personnel officer  Tobacco Use   Smoking status: Former    Current packs/day: 0.00    Average packs/day: 1 pack/day for 30.0 years (30.0 ttl pk-yrs)    Types: Cigarettes    Start date: 05/22/1959    Quit date: 05/21/1989    Years since quitting: 34.4    Passive exposure: Never   Smokeless tobacco: Never  Vaping Use   Vaping status: Never Used  Substance and Sexual Activity   Alcohol use: Yes    Comment: 1 drink per day   Drug use: No   Sexual activity: Not on file  Other Topics Concern   Not on file  Social History Narrative   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   Social Determinants of Health   Financial Resource Strain: Low Risk  (11/05/2023)   Overall Financial Resource Strain (CARDIA)    Difficulty of Paying Living Expenses: Not very hard  Food Insecurity: No Food Insecurity (11/05/2023)   Hunger Vital Sign    Worried About Running Out of Food in the Last Year: Never true    Ran Out of Food in the Last Year: Never true  Transportation Needs: No Transportation Needs (11/05/2023)   PRAPARE - Administrator, Civil Service (Medical): No    Lack of  Transportation (Non-Medical): No  Physical Activity: Sufficiently Active (11/05/2023)   Exercise Vital Sign    Days of Exercise per Week: 5 days    Minutes of Exercise per Session: 30 min  Stress: No Stress Concern Present (11/05/2023)   [employer] of Occupational Health - Occupational Stress Questionnaire    Feeling of Stress : Not at all  Social Connections: Moderately Integrated (11/05/2023)   Social Connection  and Isolation Panel [NHANES]    Frequency of Communication with Friends and Family: More than three times a week    Frequency of Social Gatherings with Friends and Family: Three times a week    Attends Religious Services: More than 4 times per year    Active Member of Clubs or Organizations: No    Attends Banker Meetings: Never    Marital Status: Married    Tobacco Counseling Counseling given: Not Answered   Clinical Intake:  Pre-visit preparation completed: Yes  Pain : No/denies pain     Diabetes: Yes CBG done?: No Did pt. bring in CBG monitor from home?: No  How often do you need to have someone help you when you read instructions, pamphlets, or other written materials from your doctor or pharmacy?: 1 - Never  Interpreter Needed?: No  Information entered by :: Kandis Fantasia LPN   Activities of Daily Living    11/05/2023    9:16 AM 09/16/2023    3:48 PM  In your present state of health, do you have any difficulty performing the following activities:  Hearing? 0   Vision? 0   Difficulty concentrating or making decisions? 0   Walking or climbing stairs? 0   Dressing or bathing? 0   Doing errands, shopping? 0 0  Preparing Food and eating ? N   Using the Toilet? N   In the past six months, have you accidently leaked urine? N   Do you have problems with loss of bowel control? N   Managing your Medications? N   Managing your Finances? N   Housekeeping or managing your Housekeeping? N     Patient Care Team: Anabel Halon, MD as  PCP - General (Internal Medicine) Wyline Mood Dorothe Pea, MD as PCP - Cardiology (Cardiology) Wyline Mood Dorothe Pea, MD as Consulting Physician (Cardiology) Janalyn Harder, MD (Inactive) as Consulting Physician (Dermatology) Pollyann Savoy, MD as Consulting Physician (Rheumatology) Marcine Matar, MD as Consulting Physician (Urology) Wynn Banker Victorino Sparrow, MD as Consulting Physician (Physical Medicine and Rehabilitation) [facility], [facility]  Indicate any recent Medical Services you may have received from other than [facility] providers in the past year (date may be approximate).     Assessment:   This is a routine wellness examination for Andrik.  Hearing/Vision screen Hearing Screening - Comments:: Denies hearing difficulties   Vision Screening - Comments:: up to date with routine eye exams with [facility].     Goals Addressed   None   Depression Screen    11/05/2023    9:20 AM 10/28/2023    2:12 PM 10/09/2023    8:53 AM 10/07/2023    2:03 PM 08/07/2023   10:21 AM 05/01/2023    9:47 AM 03/18/2023    1:51 PM  PHQ 2/9 Scores  PHQ - 2 Score 0 0 0 0 3 3 3   PHQ- 9 Score 0  0 4 9 9 9     Fall Risk    11/05/2023    9:22 AM 10/28/2023    2:12 PM 10/09/2023    8:53 AM 08/07/2023   10:20 AM 05/01/2023    9:47 AM  Fall Risk   Falls in the past year? 0 0 0 0 0  Number falls in past yr: 0 0 0  0  Injury with Fall? 0 0 0  0  Risk for fall due to : No Fall Risks  No Fall Risks  No Fall Risks  Follow up Falls prevention discussed;Education  provided;Falls evaluation completed  Falls evaluation completed  Falls evaluation completed    MEDICARE RISK AT HOME: Medicare Risk at Home Any stairs in or around the home?: No If so, are there any without handrails?: No Home free of loose throw rugs in walkways, pet beds, electrical cords, etc?: Yes Adequate lighting in your home to reduce risk of falls?: Yes Life alert?: No Use of a cane, walker or w/c?: No Grab bars in the  bathroom?: Yes Shower chair or bench in shower?: No Elevated toilet seat or a handicapped toilet?: Yes  TIMED UP AND GO:  Was the test performed?  No    Cognitive Function:    09/14/2021   11:42 AM  MMSE - Mini Mental State Exam  Not completed: Unable to complete        11/05/2023    9:22 AM 09/23/2022   10:41 AM 09/14/2021   11:55 AM 08/16/2020   10:24 AM  6CIT Screen  What Year? 0 points 0 points 0 points 0 points  What month? 0 points 0 points 0 points 0 points  What time? 0 points 0 points 0 points 0 points  Count back from 20 0 points 0 points 0 points 0 points  Months in reverse 2 points 0 points 0 points 0 points  Repeat phrase 0 points 2 points 0 points 2 points  Total Score 2 points 2 points 0 points 2 points    Immunizations Immunization History  Administered Date(s) Administered   Fluad Quad(high Dose 65+) 09/10/2022, 09/14/2023   Hepb-cpg 06/11/2023, 07/02/2023, 08/06/2023, 10/01/2023   Influenza, High Dose Seasonal PF 09/12/2017, 08/28/2019, 08/28/2019   Influenza-Unspecified 08/08/2020, 08/24/2021, 08/24/2021   PFIZER(Purple Top)SARS-COV-2 Vaccination 01/22/2020, 02/12/2020, 08/15/2020, 01/19/2021, 09/14/2023   PNEUMOCOCCAL CONJUGATE-20 01/28/2022   Pfizer(Comirnaty)Fall Seasonal Vaccine 12 years and older 09/07/2023   Pneumococcal Polysaccharide-23 08/16/2009   Td 10/26/2021   Tdap 09/28/2021   Zoster Recombinant(Shingrix) 07/29/2019, 09/28/2021    TDAP status: Up to date  Flu Vaccine status: Up to date  Pneumococcal vaccine status: Up to date  Covid-19 vaccine status: Completed vaccines  Qualifies for Shingles Vaccine? Yes   Zostavax completed No   Shingrix Completed?: Yes  Screening Tests Health Maintenance  Topic Date Due   OPHTHALMOLOGY EXAM  06/27/2023   COVID-19 Vaccine (6 - 2023-24 season) 11/09/2023   FOOT EXAM  01/28/2024   HEMOGLOBIN A1C  02/07/2024   Medicare Annual Wellness (AWV)  11/04/2024   DTaP/Tdap/Td (3 - Td or Tdap)  10/27/2031   Pneumonia Vaccine 31+ Years old  Completed   INFLUENZA VACCINE  Completed   Hepatitis C Screening  Completed   Zoster Vaccines- Shingrix  Completed   HPV VACCINES  Aged Out   Colonoscopy  Discontinued    Health Maintenance  Health Maintenance Due  Topic Date Due   OPHTHALMOLOGY EXAM  06/27/2023   COVID-19 Vaccine (6 - 2023-24 season) 11/09/2023    Colorectal cancer screening: No longer required.   Lung Cancer Screening: (Low Dose CT Chest recommended if Age 20-80 years, 20 pack-year currently smoking OR have quit w/in 15years.) does not qualify.   Lung Cancer Screening Referral: n/a  Additional Screening:  Hepatitis C Screening: does qualify; Completed 11/02/20  Vision Screening: Recommended annual ophthalmology exams for early detection of glaucoma and other disorders of the eye. Is the patient up to date with their annual eye exam?  Yes  Who is the provider or what is the name of the office in which the patient attends annual  eye exams? [facility].  If pt is not established with a provider, would they like to be referred to a provider to establish care? No .   Dental Screening: Recommended annual dental exams for proper oral hygiene  Diabetic Foot Exam: Diabetic Foot Exam: Completed 01/27/23  Community Resource Referral / Chronic Care Management: CRR required this visit?  No   CCM required this visit?  No     Plan:     I have personally reviewed and noted the following in the patient's chart:   Medical and social history Use of alcohol, tobacco or illicit drugs  Current medications and supplements including opioid prescriptions. Patient is not currently taking opioid prescriptions. Functional ability and status Nutritional status Physical activity Advanced directives List of other physicians Hospitalizations, surgeries, and ER visits in previous 12 months Vitals Screenings to include cognitive, depression, and falls Referrals and  appointments  In addition, I have reviewed and discussed with patient certain preventive protocols, quality metrics, and best practice recommendations. A written personalized care plan for preventive services as well as general preventive health recommendations were provided to patient.     Kandis Fantasia [place], [place]   [id number]   After Visit Summary: (MyChart) Due to this being a telephonic visit, the after visit summary with patients personalized plan was offered to patient via MyChart   Nurse Notes: No concerns at this time

## 2023-11-05 NOTE — Patient Instructions (Signed)
Jacob Farmer , Thank you for taking time to come for your Medicare Wellness Visit. I appreciate your ongoing commitment to your health goals. Please review the following plan we discussed and let me know if I can assist you in the future.   Referrals/Orders/Follow-Ups/Clinician Recommendations: Aim for 30 minutes of exercise or brisk walking, 6-8 glasses of water, and 5 servings of fruits and vegetables each day.  This is a list of the screening recommended for you and due dates:  Health Maintenance  Topic Date Due   Eye exam for diabetics  06/27/2023   Medicare Annual Wellness Visit  09/24/2023   COVID-19 Vaccine (6 - 2023-24 season) 11/09/2023   Complete foot exam   01/28/2024   Hemoglobin A1C  02/07/2024   DTaP/Tdap/Td vaccine (3 - Td or Tdap) 10/27/2031   Pneumonia Vaccine  Completed   Flu Shot  Completed   Hepatitis C Screening  Completed   Zoster (Shingles) Vaccine  Completed   HPV Vaccine  Aged Out   Colon Cancer Screening  Discontinued    Advanced directives: (ACP Link)Information on Advanced Care Planning can be found at [facility] of [place] Advance Health Care Directives Advance Health Care Directives ([email or web address]   Next Medicare Annual Wellness Visit scheduled for next year: Yes

## 2023-11-07 ENCOUNTER — Other Ambulatory Visit: Payer: Self-pay

## 2023-11-07 DIAGNOSIS — N2581 Secondary hyperparathyroidism of renal origin: Secondary | ICD-10-CM | POA: Diagnosis not present

## 2023-11-07 DIAGNOSIS — N186 End stage renal disease: Secondary | ICD-10-CM | POA: Diagnosis not present

## 2023-11-07 DIAGNOSIS — Z992 Dependence on renal dialysis: Secondary | ICD-10-CM | POA: Diagnosis not present

## 2023-11-07 MED ORDER — DUPIXENT 300 MG/2ML ~~LOC~~ SOAJ: 300.0000 mg | SUBCUTANEOUS | 0 refills | Status: DC

## 2023-11-07 NOTE — Progress Notes (Signed)
RF per pharmacy request. aw

## 2023-11-09 DIAGNOSIS — N2581 Secondary hyperparathyroidism of renal origin: Secondary | ICD-10-CM | POA: Diagnosis not present

## 2023-11-09 DIAGNOSIS — N186 End stage renal disease: Secondary | ICD-10-CM | POA: Diagnosis not present

## 2023-11-09 DIAGNOSIS — Z992 Dependence on renal dialysis: Secondary | ICD-10-CM | POA: Diagnosis not present

## 2023-11-11 DIAGNOSIS — N2581 Secondary hyperparathyroidism of renal origin: Secondary | ICD-10-CM | POA: Diagnosis not present

## 2023-11-11 DIAGNOSIS — N186 End stage renal disease: Secondary | ICD-10-CM | POA: Diagnosis not present

## 2023-11-11 DIAGNOSIS — Z992 Dependence on renal dialysis: Secondary | ICD-10-CM | POA: Diagnosis not present

## 2023-11-12 DIAGNOSIS — I69354 Hemiplegia and hemiparesis following cerebral infarction affecting left non-dominant side: Secondary | ICD-10-CM | POA: Diagnosis not present

## 2023-11-12 DIAGNOSIS — I69392 Facial weakness following cerebral infarction: Secondary | ICD-10-CM | POA: Diagnosis not present

## 2023-11-12 DIAGNOSIS — M069 Rheumatoid arthritis, unspecified: Secondary | ICD-10-CM | POA: Diagnosis not present

## 2023-11-12 DIAGNOSIS — I480 Paroxysmal atrial fibrillation: Secondary | ICD-10-CM | POA: Diagnosis not present

## 2023-11-12 DIAGNOSIS — N186 End stage renal disease: Secondary | ICD-10-CM | POA: Diagnosis not present

## 2023-11-12 DIAGNOSIS — Z992 Dependence on renal dialysis: Secondary | ICD-10-CM | POA: Diagnosis not present

## 2023-11-12 DIAGNOSIS — E1122 Type 2 diabetes mellitus with diabetic chronic kidney disease: Secondary | ICD-10-CM | POA: Diagnosis not present

## 2023-11-12 DIAGNOSIS — I120 Hypertensive chronic kidney disease with stage 5 chronic kidney disease or end stage renal disease: Secondary | ICD-10-CM | POA: Diagnosis not present

## 2023-11-12 DIAGNOSIS — C61 Malignant neoplasm of prostate: Secondary | ICD-10-CM | POA: Diagnosis not present

## 2023-11-14 DIAGNOSIS — N2581 Secondary hyperparathyroidism of renal origin: Secondary | ICD-10-CM | POA: Diagnosis not present

## 2023-11-14 DIAGNOSIS — Z992 Dependence on renal dialysis: Secondary | ICD-10-CM | POA: Diagnosis not present

## 2023-11-14 DIAGNOSIS — N186 End stage renal disease: Secondary | ICD-10-CM | POA: Diagnosis not present

## 2023-11-16 DIAGNOSIS — N186 End stage renal disease: Secondary | ICD-10-CM | POA: Diagnosis not present

## 2023-11-16 DIAGNOSIS — Z992 Dependence on renal dialysis: Secondary | ICD-10-CM | POA: Diagnosis not present

## 2023-11-17 ENCOUNTER — Ambulatory Visit: Payer: Medicare HMO | Admitting: Cardiology

## 2023-11-17 DIAGNOSIS — N186 End stage renal disease: Secondary | ICD-10-CM | POA: Diagnosis not present

## 2023-11-17 DIAGNOSIS — Z992 Dependence on renal dialysis: Secondary | ICD-10-CM | POA: Diagnosis not present

## 2023-11-17 DIAGNOSIS — N2581 Secondary hyperparathyroidism of renal origin: Secondary | ICD-10-CM | POA: Diagnosis not present

## 2023-11-18 ENCOUNTER — Ambulatory Visit: Payer: Medicare HMO | Admitting: Physician Assistant

## 2023-11-19 DIAGNOSIS — N186 End stage renal disease: Secondary | ICD-10-CM | POA: Diagnosis not present

## 2023-11-19 DIAGNOSIS — Z992 Dependence on renal dialysis: Secondary | ICD-10-CM | POA: Diagnosis not present

## 2023-11-19 DIAGNOSIS — N2581 Secondary hyperparathyroidism of renal origin: Secondary | ICD-10-CM | POA: Diagnosis not present

## 2023-11-20 DIAGNOSIS — I69354 Hemiplegia and hemiparesis following cerebral infarction affecting left non-dominant side: Secondary | ICD-10-CM | POA: Diagnosis not present

## 2023-11-20 DIAGNOSIS — C61 Malignant neoplasm of prostate: Secondary | ICD-10-CM | POA: Diagnosis not present

## 2023-11-20 DIAGNOSIS — Z992 Dependence on renal dialysis: Secondary | ICD-10-CM | POA: Diagnosis not present

## 2023-11-20 DIAGNOSIS — E1122 Type 2 diabetes mellitus with diabetic chronic kidney disease: Secondary | ICD-10-CM | POA: Diagnosis not present

## 2023-11-20 DIAGNOSIS — M069 Rheumatoid arthritis, unspecified: Secondary | ICD-10-CM | POA: Diagnosis not present

## 2023-11-20 DIAGNOSIS — H43393 Other vitreous opacities, bilateral: Secondary | ICD-10-CM | POA: Diagnosis not present

## 2023-11-20 DIAGNOSIS — N186 End stage renal disease: Secondary | ICD-10-CM | POA: Diagnosis not present

## 2023-11-20 DIAGNOSIS — I69392 Facial weakness following cerebral infarction: Secondary | ICD-10-CM | POA: Diagnosis not present

## 2023-11-20 DIAGNOSIS — I480 Paroxysmal atrial fibrillation: Secondary | ICD-10-CM | POA: Diagnosis not present

## 2023-11-20 DIAGNOSIS — I120 Hypertensive chronic kidney disease with stage 5 chronic kidney disease or end stage renal disease: Secondary | ICD-10-CM | POA: Diagnosis not present

## 2023-11-21 DIAGNOSIS — Z992 Dependence on renal dialysis: Secondary | ICD-10-CM | POA: Diagnosis not present

## 2023-11-21 DIAGNOSIS — N2581 Secondary hyperparathyroidism of renal origin: Secondary | ICD-10-CM | POA: Diagnosis not present

## 2023-11-21 DIAGNOSIS — N186 End stage renal disease: Secondary | ICD-10-CM | POA: Diagnosis not present

## 2023-11-24 DIAGNOSIS — N2581 Secondary hyperparathyroidism of renal origin: Secondary | ICD-10-CM | POA: Diagnosis not present

## 2023-11-24 DIAGNOSIS — Z992 Dependence on renal dialysis: Secondary | ICD-10-CM | POA: Diagnosis not present

## 2023-11-24 DIAGNOSIS — N186 End stage renal disease: Secondary | ICD-10-CM | POA: Diagnosis not present

## 2023-11-25 ENCOUNTER — Ambulatory Visit: Payer: Medicare HMO | Attending: Cardiology | Admitting: Cardiology

## 2023-11-25 ENCOUNTER — Encounter: Payer: Self-pay | Admitting: Cardiology

## 2023-11-25 ENCOUNTER — Ambulatory Visit (INDEPENDENT_AMBULATORY_CARE_PROVIDER_SITE_OTHER): Payer: Medicare HMO

## 2023-11-25 VITALS — BP 120/88 | HR 78 | Ht 70.0 in | Wt 155.2 lb

## 2023-11-25 DIAGNOSIS — D6869 Other thrombophilia: Secondary | ICD-10-CM

## 2023-11-25 DIAGNOSIS — Z8679 Personal history of other diseases of the circulatory system: Secondary | ICD-10-CM

## 2023-11-25 DIAGNOSIS — I480 Paroxysmal atrial fibrillation: Secondary | ICD-10-CM

## 2023-11-25 DIAGNOSIS — I4891 Unspecified atrial fibrillation: Secondary | ICD-10-CM

## 2023-11-25 DIAGNOSIS — K922 Gastrointestinal hemorrhage, unspecified: Secondary | ICD-10-CM

## 2023-11-25 NOTE — Progress Notes (Unsigned)
Enrolled for [employer] to mail a ZIO XT long term holter monitor to the patients address on file.  

## 2023-11-25 NOTE — Patient Instructions (Addendum)
Medication Instructions:  Your physician recommends that you continue on your current medications as directed. Please refer to the Current Medication list given to you today.  *If you need a refill on your cardiac medications before your next appointment, please call your pharmacy*   Lab Work: TODAY: BMET  If you have labs (blood work) drawn today and your tests are completely normal, you will receive your results only by: MyChart Message (if you have MyChart) OR A paper copy in the mail If you have any lab test that is abnormal or we need to change your treatment, we will call you to review the results.   Testing/Procedures:  Watchman CT: Your physician has requested that you have cardiac CT. Cardiac computed tomography (CT) is a painless test that uses an x-ray machine to take clear, detailed pictures of your heart. For further information please visit [email or web address] Please follow instruction sheet as given.  Heart Monitor: Your physician has recommended that you wear an event monitor. Event monitors are medical devices that record the heart's electrical activity. Doctors most often [place] these monitors to diagnose arrhythmias. Arrhythmias are problems with the speed or rhythm of the heartbeat. The monitor is a small, portable device. You can wear one while you do your normal daily activities. This is usually used to diagnose what is causing palpitations/syncope (passing out).  Follow-Up: At [facility], you and your health needs are our priority.  As part of our continuing mission to provide you with exceptional heart care, we have created designated Provider Care Teams.  These Care Teams include your primary Cardiologist (physician) and Advanced Practice Providers (APPs -  Physician Assistants and Nurse Practitioners) who all work together to provide you with the care you need, when you need it.  Your next appointment:   6 weeks  Provider:   Nobie Putnam, MD    Christena Deem- Long Term Monitor Instructions  Your physician has requested you wear a ZIO patch monitor for 14 days.  This is a single patch monitor. [employer] supplies one patch monitor per enrollment. Additional stickers are not available. Please do not apply patch if you will be having a Nuclear Stress Test,  Echocardiogram, Cardiac CT, MRI, or Chest Xray during the period you would be wearing the  monitor. The patch cannot be worn during these tests. You cannot remove and re-apply the  ZIO XT patch monitor.  Your ZIO patch monitor will be mailed 3 day USPS to your address on file. It may take 3-5 days  to receive your monitor after you have been enrolled.  Once you have received your monitor, please review the enclosed instructions. Your monitor  has already been registered assigning a specific monitor serial # to you.  Billing and Patient Assistance Program Information  We have supplied [employer] with any of your insurance information on file for billing purposes. [employer] offers a sliding scale Patient Assistance Program for patients that do not have  insurance, or whose insurance does not completely cover the cost of the ZIO monitor.  You must apply for the Patient Assistance Program to qualify for this discounted rate.  To apply, please call [employer] at [phone], select option 4, select option 2, ask to apply for  Patient Assistance Program. Meredeth Ide will ask your household income, and how many people  are in your household. They will quote your out-of-pocket cost based on that information.  [employer] will also be able to set up a 88-month, interest-free payment plan if needed.  Applying the monitor   Shave hair from upper left chest.  Hold abrader disc by orange tab. Rub abrader in 40 strokes over the upper left chest as  indicated in your monitor instructions.  Clean area with 4 enclosed alcohol pads. Let dry.  Apply patch as indicated in monitor instructions. Patch will be placed under  collarbone on left  side of chest with arrow pointing upward.  Rub patch adhesive wings for 2 minutes. Remove white label marked "1". Remove the white  label marked "2". Rub patch adhesive wings for 2 additional minutes.  While looking in a mirror, press and release button in center of patch. A small green light will  flash 3-4 times. This will be your only indicator that the monitor has been turned on.  Do not shower for the first 24 hours. You may shower after the first 24 hours.  Press the button if you feel a symptom. You will hear a small click. Record Date, Time and  Symptom in the Patient Logbook.  When you are ready to remove the patch, follow instructions on the last 2 pages of Patient  Logbook. Stick patch monitor onto the last page of Patient Logbook.  Place Patient Logbook in the blue and white box. Use locking tab on box and tape box closed  securely. The blue and white box has prepaid postage on it. Please place it in the mailbox as  soon as possible. Your physician should have your test results approximately 7 days after the  monitor has been mailed back to [facility].  Call [facility] Customer Care at [phone] if you have questions regarding  your ZIO XT patch monitor. Call them immediately if you see an orange light blinking on your  monitor.  If your monitor falls off in less than 4 days, contact our Monitor department at [phone].  If your monitor becomes loose or falls off after 4 days call [employer] at [phone] for  suggestions on securing your monitor

## 2023-11-25 NOTE — Progress Notes (Addendum)
**Note Jacob-Identified via Obfuscation** Electrophysiology Office Note:   Date:  11/25/2023  ID:  DEMARIE Farmer, DOB 1946-09-28, MRN [id number]  Primary Cardiologist: Dina Rich, MD Electrophysiologist: Nobie Putnam, MD      History of Present Illness:   Jacob Farmer is a 77 y.o. male with h/o coronary disease, paroxysmal atrial fibrillation, tachybradycardia syndrome, HFpEF, type 2 diabetes, essential hypertension with bouts of orthostatic hypotension, rheumatoid and osteoarthritis, end-stage renal disease, prostate cancer, GI bleeding and stroke who is being seen today for evaluation for Watchman device implant.   Patient was hospitalized in May 2020 for with acute blood loss anemia secondary to GI bleeding which required transfusion of 2 units of blood.  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  Hemoglobin remained stable and he was discharged off Eliquis.  Patient had recurrence of symptomatic anemia and was found to have a hemoglobin of 4.9 in July 2024 and again required transfusion of blood, this time 4 units PRBCs.  He remained off Eliquis after this visit.  He then presented in September 2024 to the ED with an acute stroke.  This was felt to be due to cardioembolic origin from his atrial fibrillation off oral anticoagulation.  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  He was ultimately discharged to rehab on 09/16/2023 and then ultimately home with home health services on 09/25/2023. He thinks that he has recovered relatively well. He is quite independent and returned to reasonable functional status. He is doing dialysis 3 times weekly. He does feel fatigued after completing a dialysis session. Otherwise no new or acute complaints.  Review of systems complete and found to be negative unless listed in HPI.   EP Information / Studies Reviewed:    EKG is not ordered  today. EKG from 09/12/23 reviewed which showed sinus rhythm with RBBB.      Echo 09/12/23: 1. Left ventricular ejection fraction, by estimation, is 60 to 65%. The  left ventricle has normal function. The left ventricle demonstrates  regional wall motion abnormalities (see scoring diagram/findings for  description). Left ventricular diastolic  parameters are consistent with Grade I diastolic dysfunction (impaired  relaxation).   2. Right ventricular systolic function is normal. The right ventricular  size is normal. There is normal pulmonary artery systolic pressure.   3. The mitral valve is normal in structure. No evidence of mitral valve  regurgitation. No evidence of mitral stenosis.   4. The aortic valve is tricuspid. Aortic valve regurgitation is not  visualized. No aortic stenosis is present.   5. Aortic dilatation noted. There is mild dilatation of the aortic root,  measuring 41 mm.   6. The inferior vena cava is normal in size with greater than 50%  respiratory variability, suggesting right atrial pressure of 3 mmHg.   Risk Assessment/Calculations:    CHA2DS2-VASc Score = 6   This indicates a 9.7% annual risk of stroke. The patient's score is based upon: CHF History: 1 HTN History: 1 Diabetes History: 1 Stroke History: 0 Vascular Disease History: 1 Age Score: 2 Gender Score: 0             Physical Exam:   VS:  BP 120/88 (BP Location: Left Arm, Patient Position: Sitting, Cuff Size: Normal)   Pulse 78   Ht 5\' 10"  (1.778 m)   Wt 155 lb 3.2 oz (70.4 kg)   SpO2  98%   BMI 22.27 kg/m    Wt Readings from Last 3 Encounters:  11/25/23 155 lb 3.2 oz (70.4 kg)  11/05/23 155 lb (70.3 kg)  10/28/23 155 lb (70.3 kg)     GEN: Well nourished, well developed in no acute distress NECK: No JVD; No carotid bruits CARDIAC: Normal rate and regular. RESPIRATORY:  Clear to auscultation without rales, wheezing or rhonchi  ABDOMEN: Soft, non-tender, non-distended EXTREMITIES:  No  edema; No deformity   ASSESSMENT AND PLAN:   TSHOMBE DOOLIN is a 77 y.o. male with h/o coronary disease, paroxysmal atrial fibrillation, tachybradycardia syndrome, HFpEF, type 2 diabetes, essential hypertension with bouts of orthostatic hypotension, rheumatoid and osteoarthritis, end-stage renal disease, prostate cancer, GI bleeding and stroke who is being seen today for evaluation for Watchman device implant.   I have seen Jacob Farmer in the office today who is being considered for a Watchman left atrial appendage closure device. I believe they will benefit from this procedure given their history of atrial fibrillation, CHA2DS2-VASc score of 6 and unadjusted ischemic stroke rate of 9.7% per year. Unfortunately, the patient is not felt to be a long term anticoagulation candidate secondary to GI bleeding and intracranial hemorrhage. The patient's chart has been reviewed and I feel that they would be a candidate for short term oral anticoagulation after Watchman implant.   It is my belief that after undergoing a LAA closure procedure, Jacob Farmer will not need long term anticoagulation which eliminates anticoagulation side effects and major bleeding risk.   Procedural risks for the Watchman implant have been reviewed with the patient including a 0.5% risk of stroke, <1% risk of perforation and <1% risk of device embolization. Other risks include bleeding, vascular damage, tamponade, worsening renal function, and death. The patient understands these risk and wishes to proceed.     The published clinical data on the safety and effectiveness of WATCHMAN include but are not limited to the following: - Holmes DR, Everlene Farrier, Sick P et al. for the PROTECT AF Investigators. Percutaneous closure of the left atrial appendage versus warfarin therapy for prevention of stroke in patients with atrial fibrillation: a randomised non-inferiority trial. Lancet 2009; 374: 534-42. Everlene Farrier, Doshi SK, Isa Rankin D et al. on behalf of the PROTECT AF Investigators. Percutaneous Left Atrial Appendage Closure for Stroke Prophylaxis in Patients With Atrial Fibrillation 2.3-Year Follow-up of the PROTECT AF (Watchman Left Atrial Appendage System for Embolic Protection in Patients With Atrial Fibrillation) Trial. Circulation 2013; 127:720-729. - Alli O, Doshi S,  Kar S, Reddy VY, Sievert H et al. Quality of Life Assessment in the Randomized PROTECT AF (Percutaneous Closure of the Left Atrial Appendage Versus Warfarin Therapy for Prevention of Stroke in Patients With Atrial Fibrillation) Trial of Patients at Risk for Stroke With Nonvalvular Atrial Fibrillation. J Am Coll Cardiol 2013; 61:1790-8. Aline August DR, Mia [place], Price M, Whisenant B, Sievert H, Doshi S, Huber K, Reddy V. Prospective randomized evaluation of the Watchman left atrial appendage Device in patients with atrial fibrillation versus long-term warfarin therapy; the PREVAIL trial. Journal of the [employer] of Cardiology, Vol. 4, No. 1, 2014, 1-11. - Kar S, Doshi SK, Sadhu A, Horton R, Osorio J et al. Primary outcome evaluation of a next-generation left atrial appendage closure device: results from the PINNACLE FLX trial. Circulation 2021;143(18)1754-1762.    After today's visit with the patient which was dedicated solely for shared decision making visit regarding LAA closure device, the  patient decided to proceed with the LAA appendage closure procedure scheduled to be done in the near future at [facility]. Prior to the procedure, I would like to obtain a gated CT scan of the chest with contrast timed for PV/LA visualization.    ASSESSMENT AND PLAN: Paroxysmal Atrial Fibrillation (ICD10:  I48.0) Additionally, we decided to order a Zio monitor to assess for AF burden. Secondary Hypercoagulable State (ICD10:  D68.69) The patient is at significant risk for stroke/thromboembolism based upon his CHA2DS2-VASc Score of 6.  However, the patient  is not on anticoagulation due to his high bleeding risk.     Follow up with Dr. Jimmey Ralph  in 6 weeks.   Total time of encounter: 61 minutes total time of encounter, including chart review, face-to-face patient care, coordination of care and counseling regarding high complexity medical decision making.  Signed, Nobie Putnam, MD

## 2023-11-26 DIAGNOSIS — N186 End stage renal disease: Secondary | ICD-10-CM | POA: Diagnosis not present

## 2023-11-26 DIAGNOSIS — Z992 Dependence on renal dialysis: Secondary | ICD-10-CM | POA: Diagnosis not present

## 2023-11-26 DIAGNOSIS — N2581 Secondary hyperparathyroidism of renal origin: Secondary | ICD-10-CM | POA: Diagnosis not present

## 2023-11-27 DIAGNOSIS — I69354 Hemiplegia and hemiparesis following cerebral infarction affecting left non-dominant side: Secondary | ICD-10-CM | POA: Diagnosis not present

## 2023-11-27 DIAGNOSIS — N186 End stage renal disease: Secondary | ICD-10-CM | POA: Diagnosis not present

## 2023-11-27 DIAGNOSIS — I120 Hypertensive chronic kidney disease with stage 5 chronic kidney disease or end stage renal disease: Secondary | ICD-10-CM | POA: Diagnosis not present

## 2023-11-27 DIAGNOSIS — C61 Malignant neoplasm of prostate: Secondary | ICD-10-CM | POA: Diagnosis not present

## 2023-11-27 DIAGNOSIS — Z992 Dependence on renal dialysis: Secondary | ICD-10-CM | POA: Diagnosis not present

## 2023-11-27 DIAGNOSIS — I480 Paroxysmal atrial fibrillation: Secondary | ICD-10-CM | POA: Diagnosis not present

## 2023-11-27 DIAGNOSIS — M069 Rheumatoid arthritis, unspecified: Secondary | ICD-10-CM | POA: Diagnosis not present

## 2023-11-27 DIAGNOSIS — E1122 Type 2 diabetes mellitus with diabetic chronic kidney disease: Secondary | ICD-10-CM | POA: Diagnosis not present

## 2023-11-27 DIAGNOSIS — I69392 Facial weakness following cerebral infarction: Secondary | ICD-10-CM | POA: Diagnosis not present

## 2023-11-28 DIAGNOSIS — N2581 Secondary hyperparathyroidism of renal origin: Secondary | ICD-10-CM | POA: Diagnosis not present

## 2023-11-28 DIAGNOSIS — N186 End stage renal disease: Secondary | ICD-10-CM | POA: Diagnosis not present

## 2023-11-28 DIAGNOSIS — Z992 Dependence on renal dialysis: Secondary | ICD-10-CM | POA: Diagnosis not present

## 2023-11-29 DIAGNOSIS — I4891 Unspecified atrial fibrillation: Secondary | ICD-10-CM

## 2023-12-01 ENCOUNTER — Telehealth: Payer: Self-pay | Admitting: Physician Assistant

## 2023-12-01 DIAGNOSIS — N2581 Secondary hyperparathyroidism of renal origin: Secondary | ICD-10-CM | POA: Diagnosis not present

## 2023-12-01 DIAGNOSIS — Z992 Dependence on renal dialysis: Secondary | ICD-10-CM | POA: Diagnosis not present

## 2023-12-01 DIAGNOSIS — N186 End stage renal disease: Secondary | ICD-10-CM | POA: Diagnosis not present

## 2023-12-01 NOTE — Telephone Encounter (Signed)
Patient contacted the office requesting lab orders to be be release to [facility] [address] in [place].   Patient plans to have labs on this Thursday the 19th.

## 2023-12-03 ENCOUNTER — Other Ambulatory Visit: Payer: Self-pay | Admitting: *Deleted

## 2023-12-03 DIAGNOSIS — N2581 Secondary hyperparathyroidism of renal origin: Secondary | ICD-10-CM | POA: Diagnosis not present

## 2023-12-03 DIAGNOSIS — M1A09X0 Idiopathic chronic gout, multiple sites, without tophus (tophi): Secondary | ICD-10-CM

## 2023-12-03 DIAGNOSIS — Z992 Dependence on renal dialysis: Secondary | ICD-10-CM | POA: Diagnosis not present

## 2023-12-03 DIAGNOSIS — N186 End stage renal disease: Secondary | ICD-10-CM | POA: Diagnosis not present

## 2023-12-03 DIAGNOSIS — Z79899 Other long term (current) drug therapy: Secondary | ICD-10-CM

## 2023-12-03 NOTE — Telephone Encounter (Signed)
Lab Order released.  

## 2023-12-04 ENCOUNTER — Ambulatory Visit: Payer: Medicare HMO | Admitting: Internal Medicine

## 2023-12-04 DIAGNOSIS — M1A09X0 Idiopathic chronic gout, multiple sites, without tophus (tophi): Secondary | ICD-10-CM | POA: Diagnosis not present

## 2023-12-04 DIAGNOSIS — Z79899 Other long term (current) drug therapy: Secondary | ICD-10-CM | POA: Diagnosis not present

## 2023-12-04 NOTE — Progress Notes (Signed)
 Office Visit Note  Patient: Jacob Farmer             Date of Birth: May 25, 1946           MRN: [id number]             PCP: Jacob Suzzane POUR, MD Referring: Jacob Suzzane POUR, MD Visit Date: 12/18/2023 Occupation: @GUAROCC @  Subjective:  Medication monitoring  History of Present Illness: Jacob Farmer is a 77 y.o. male with history of seronegative rheumatoid arthritis, fibromyalgia, and gout.  Patient reports that he has been started on dialysis which he receives Monday, Wednesday, and Fridays.  Patient states that he also has been diagnosed with a stroke since his last office visit.  Patient remains on Arava  10 mg 1 tablet by mouth daily.  He is tolerating Arava  without any side effects and has not had any recent gaps in therapy.  He denies any signs or symptoms of a rheumatoid arthritis flare.  He continues to experience intermittent discomfort in his lower back if standing for prolonged periods of time.  Patient had a radiofrequency ablation performed by Dr. Eldonna on 10/15/2022 which provided significant relief but his symptoms have started to recurred.   He denies any recent gout flares.  He has been taking allopurinol  100 mg 1 tablet by mouth every other day.  Patient keeps mitigare  on hand to take during flares but he has not needed to take mitigare  recently.  He denies any recent or recurrent infections.    Activities of Daily Living:  Patient reports morning stiffness for 0 minutes.   Patient Denies nocturnal pain.  Difficulty dressing/grooming: Denies Difficulty climbing stairs: Denies Difficulty getting out of chair: Denies Difficulty using hands for taps, buttons, cutlery, and/or writing: Reports  Review of Systems  Constitutional:  Negative for fatigue.  HENT:  Negative for mouth sores and mouth dryness.   Eyes:  Negative for dryness.  Respiratory:  Negative for shortness of breath.   Cardiovascular:  Negative for chest pain and palpitations.  Gastrointestinal:  Negative  for blood in stool, constipation and diarrhea.  Endocrine: Negative for increased urination.  Genitourinary:  Negative for involuntary urination.  Musculoskeletal:  Positive for gait problem, myalgias and myalgias. Negative for joint pain, joint pain, joint swelling, muscle weakness, morning stiffness and muscle tenderness.  Skin:  Negative for color change, rash, hair loss and sensitivity to sunlight.  Allergic/Immunologic: Negative for susceptible to infections.  Neurological:  Negative for dizziness and headaches.  Hematological:  Negative for swollen glands.  Psychiatric/Behavioral:  Positive for sleep disturbance. Negative for depressed mood. The patient is not nervous/anxious.     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   Stroke determined by clinical assessment (HCC) 09/12/2023   Ischemic ulcer (HCC) 08/11/2023   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   Prostate cancer (HCC) 08/07/2023   Jejunal ulcer 06/02/2023   Abnormal ultrasound of abdomen 06/02/2023   Acute blood loss anemia 04/19/2023   Anemia due to stage 4 chronic kidney disease (HCC) 03/25/2023   Iron  deficiency anemia 03/25/2023   Hospital discharge follow-up 02/26/2023   ABLA (acute blood loss anemia) 02/26/2023   Acquired thrombophilia (HCC) 02/13/2023   Generalized weakness 02/13/2023   Acute bronchitis 02/13/2023   Drug-induced liver injury 02/08/2023   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   Bleeding from  right ear 12/25/2022   Chronic heart failure with preserved ejection fraction (HCC) 07/22/2022   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   Dizziness 04/09/2022   Encounter for general adult medical examination with abnormal findings 01/25/2022   Paroxysmal atrial fibrillation (HCC) 01/22/2022   Allergic dermatitis 09/12/2021    Herniated cervical disc 05/26/2017    Class: Acute   Cervical spinal stenosis 05/26/2017   Primary osteoarthritis of both hands 02/04/2017   History of gout 02/04/2017   DDD (degenerative disc disease), lumbar 02/04/2017   High risk medication use 01/28/2017   History of prostate cancer 01/28/2017   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   Rheumatoid arthritis (HCC) 01/04/2011   Coronary artery disease involving native coronary artery of native heart without angina pectoris 01/12/2010   Hyperlipidemia 01/08/2010   Essential hypertension 01/08/2010    Past Medical History:  Diagnosis Date   Acute CVA (cerebrovascular accident) (HCC) 09/11/2023   CAD (coronary artery disease)    Acute myocardial infarction treated with TPA in 1990; 1999-stents to circumflex and RCA; residual total occlusion of the left anterior descending; normal ejection fraction; stress nuclear in 2001-distal anteroseptal ischemia; normal LV function   CKD (chronic kidney disease) stage 4, GFR 15-29 ml/min (HCC)    First degree heart block    History of gastric ulcer    History of kidney stones    History of MI (myocardial infarction)    1990-  treated w/ TPA   Hyperlipidemia    Hypertension    Jaundice    OA (osteoarthritis)    Prediabetes    Prostate cancer (HCC)    Stage T1c , Gleason 3+4,  PSA 4.7,  vol 24cc--  scheduled for radiative seed implants   Psoriatic arthritis (HCC)    RA (rheumatoid arthritis) (HCC)    Jacob Farmer   Sinus bradycardia    Wears dentures     Family History  Problem Relation Age of Onset   Heart attack Mother    Coronary artery disease Father    Ulcerative colitis Father    Ulcers Father    Breast cancer Sister    COPD Brother    Pancreatic cancer Brother    Healthy Sister    Diabetes Brother    Cirrhosis Son    Seizures Son    Past Surgical History:  Procedure Laterality Date   BIOPSY  08/27/2019   Procedure: BIOPSY;   Surgeon: Jacob Claudis PENNER, MD;  [facility];  Service: Endoscopy;;  gastric   BIOPSY  04/03/2022   Procedure: BIOPSY;  Surgeon: Jacob Angelia Sieving, MD;  [facility]: [facility];  Service: Gastroenterology;;   BIOPSY  04/21/2023   Procedure: BIOPSY;  Surgeon: Cindie Carlin POUR, DO;  Location: [facility];  Service: Endoscopy;;   callus removal Left 09/13/2022   left foot   CARDIOVASCULAR STRESS TEST  2001  per Dr Juventino clinic note   distal anteroseptal ischemia,  normal LVF   COLONOSCOPY  last one 2006 (approx)   COLONOSCOPY WITH PROPOFOL  N/A 04/03/2022   Procedure: COLONOSCOPY WITH PROPOFOL ;  Surgeon: Jacob Angelia Sieving, MD;  [facility]: [facility];  Service: Gastroenterology;  Laterality: N/A;  945   CORONARY ANGIOPLASTY WITH STENT PLACEMENT  1999   DES x2  to CFX and RCA/  residual total occlusion LAD,  normal LVEF   ENTEROSCOPY N/A 04/24/2023   Procedure: ENTEROSCOPY;  Surgeon: Jacob Angelia Sieving, MD;  [facility]: [facility];  Service: Gastroenterology;  Laterality: N/A;   ESOPHAGOGASTRODUODENOSCOPY (EGD) WITH PROPOFOL  N/A 08/27/2019   Procedure: ESOPHAGOGASTRODUODENOSCOPY (EGD) WITH PROPOFOL ;  Surgeon: Jacob Claudis PENNER, MD;  [facility];  Service: Endoscopy;  Laterality: N/A;  10:10   ESOPHAGOGASTRODUODENOSCOPY (EGD) WITH PROPOFOL  N/A 04/03/2022   Procedure: ESOPHAGOGASTRODUODENOSCOPY (EGD) WITH PROPOFOL ;  Surgeon: Jacob Angelia Sieving, MD;  [facility];  Service: Gastroenterology;  Laterality: N/A;   ESOPHAGOGASTRODUODENOSCOPY (EGD) WITH PROPOFOL  N/A 02/16/2023   Procedure: ESOPHAGOGASTRODUODENOSCOPY (EGD) WITH PROPOFOL ;  Surgeon: Jacob Angelia Sieving, MD;  [facility];  Service: Gastroenterology;  Laterality: N/A;   ESOPHAGOGASTRODUODENOSCOPY (EGD) WITH PROPOFOL  N/A 04/21/2023   Procedure: ESOPHAGOGASTRODUODENOSCOPY (EGD) WITH PROPOFOL ;  Surgeon: Cindie Carlin POUR, DO;  [facility];  Service:  Endoscopy;  Laterality: N/A;   EYE SURGERY Bilateral    cataract   GIVENS CAPSULE STUDY N/A 04/22/2023   Procedure: GIVENS CAPSULE STUDY;  Surgeon: Jacob Angelia Sieving, MD;  [facility];  Service: Gastroenterology;  Laterality: N/A;   HEMOSTASIS CLIP PLACEMENT  04/24/2023   Procedure: HEMOSTASIS CLIP PLACEMENT;  Surgeon: Jacob Angelia Sieving, MD;  [facility];  Service: Gastroenterology;;   HOT HEMOSTASIS  04/03/2022   Procedure: HOT HEMOSTASIS (ARGON PLASMA COAGULATION/BICAP);  Surgeon: Jacob Angelia, Sieving, MD;  [facility];  Service: Gastroenterology;;   HOT HEMOSTASIS  04/24/2023   Procedure: HOT HEMOSTASIS (ARGON PLASMA COAGULATION/BICAP);  Surgeon: Jacob Angelia, Sieving, MD;  [facility];  Service: Gastroenterology;;   IR FLUORO GUIDE CV LINE RIGHT  05/30/2023   IR US  GUIDE VASC ACCESS RIGHT  05/30/2023   POLYPECTOMY  04/03/2022   Procedure: POLYPECTOMY;  Surgeon: Jacob Angelia Sieving, MD;  [facility];  Service: Gastroenterology;;   POSTERIOR CERVICAL FUSION/FORAMINOTOMY N/A 05/26/2017   Procedure: RIGHT C4-5 FORAMINOTOMY WITH EXCISION OF HERNIATED DISC;  Surgeon: Lucilla Lynwood BRAVO, MD;  Location[facility];  Service: Orthopedics;  Laterality: N/A;   RADIOACTIVE SEED IMPLANT N/A 01/11/2016   Procedure: RADIOACTIVE SEED IMPLANT/BRACHYTHERAPY IMPLANT;  Surgeon: Garnette Shack, MD;  Location[facility];  Service: Urology;  Laterality: N/A;   73  seeds implanted no seeds founds in bladder   SCLEROTHERAPY  04/24/2023   Procedure: SCLEROTHERAPY;  Surgeon: Jacob Angelia Sieving, MD;  [facility];  Service: Gastroenterology;;   SUBMUCOSAL TATTOO INJECTION  04/24/2023   Procedure: SUBMUCOSAL TATTOO INJECTION;  Surgeon: Jacob Angelia Sieving, MD;  [facility];  Service: Gastroenterology;;   Social History   Social History Narrative   Married for 24 years.Retired personnel officer.    Immunization History  Administered Date(s) Administered   Fluad Quad(high Dose 65+) 09/10/2022, 09/14/2023   Hepb-cpg 06/11/2023, 07/02/2023, 08/06/2023, 10/01/2023   Influenza, High Dose Seasonal PF 09/12/2017, 08/28/2019, 08/28/2019   Influenza-Unspecified 08/08/2020, 08/24/2021, 08/24/2021   PFIZER(Purple Top)SARS-COV-2 Vaccination 01/22/2020, 02/12/2020, 08/15/2020, 01/19/2021, 09/14/2023   PNEUMOCOCCAL CONJUGATE-20 01/28/2022   Pfizer(Comirnaty)Fall Seasonal Vaccine 12 years and older 09/07/2023   Pneumococcal Polysaccharide-23 08/16/2009   Td 10/26/2021   Tdap 09/28/2021   Zoster Recombinant(Shingrix) 07/29/2019, 09/28/2021     Objective: Vital Signs: BP (!) 168/83 (BP Location: Left Arm, Patient Position: Sitting, Cuff Size: Normal)   Pulse 75   Resp 14   Ht 5' 10 (1.778 m)   Wt 158 lb (71.7 kg)   BMI 22.67 kg/m    Physical Exam Vitals and nursing note reviewed.  Constitutional:      Appearance: He is well-developed.  HENT:     Head: Normocephalic  and atraumatic.  Eyes:     Conjunctiva/sclera: Conjunctivae normal.     Pupils: Pupils are equal, round, and reactive to light.  Cardiovascular:     Rate and Rhythm: Normal rate and regular rhythm.     Heart sounds: Normal heart sounds.  Pulmonary:     Effort: Pulmonary effort is normal.     Breath sounds: Normal breath sounds.  Abdominal:     General: Bowel sounds are normal.     Palpations: Abdomen is soft.  Musculoskeletal:     Cervical back: Normal range of motion and neck supple.  Skin:    General: Skin is warm and dry.     Capillary Refill: Capillary refill takes less than 2 seconds.  Neurological:     Mental Status: He is alert and oriented to person, place, and time.  Psychiatric:        Behavior: Behavior normal.      Musculoskeletal Exam: Patient remained seated in the examination today.  C-spine has limited range of motion with lateral rotation.  Shoulder joints, elbow joints, wrist joints, MCPs,  PIPs, DIPs have good range of motion with no synovitis.  Complete fist formation bilaterally.  PIP and DIP thickening noted.  CMC joint thickening noted bilaterally.  Knee joints have good range of motion no warmth or effusion.  Ankle joints have good range of motion with no tenderness or joint swelling.  CDAI Exam: CDAI Score: -- Patient Global: --; Provider Global: -- Swollen: --; Tender: -- Joint Exam 12/18/2023   No joint exam has been documented for this visit   There is currently no information documented on the homunculus. Go to the Rheumatology activity and complete the homunculus joint exam.  Investigation: No additional findings.  Imaging: CT CARDIAC MORPH/PULM VEIN W/CM&W/O CA SCORE Result Date: 12/18/2023 CLINICAL DATA:  Pre Watchman EXAM: Cardiac Gated CTA TECHNIQUE: The patient was scanned on a Siemens Force 192 slice scanner. Gantry rotation speed was 250 msec with a temporal resolution of 66 msec. A prospective scan was triggered in the ascending thoracic aorta at 140 HU's Data sets were reconstructed with full mA between 35% and 75% of the R-R interval Images were reviewed using VRT, MIP and MPR modes. Double oblique images were used to measure the PV diameter and areas. The patient received 80 cc of contrast at 5 cc/sec CONTRAST:  Isovue 370 total 80 cc COMPARISON:  None Available. FINDINGS: Moderate bi atrial enlargement. No ASD/PFO. No pericardial effusion. Normal ascending thoracic aorta 3.3 cm Normal PV anatomy see measurements below Large anterior chicken wing appendage. No thrombus. Landing zone 23.9 mm x 14.9 mm with average diameter 19.1 mm Depth 25.8 mm LUPV:  Ostium 20.2 mm   area 2.8 cm2 LLPV:   Ostium 17.7 mm  area 1.6 cm2 RUPV:  Ostium 18.1 mm  area 2.7 cm2 RLPV:  Ostium 17.3 mm  area 2.2 cm2 Severe 3 vessel coronary calcium  noted LM 0 LAD 394 LCX  834 RCA 512 Total 1740 IMPRESSION: 1. Elevated calcium  score 3 vessel 1740 which is 82 nd percentile for age/sex 2. Large  anterior chicken wing appendage with average diameter 19.1 mm However maximum diameter 23.9 mm suggesting a 27 mm FLX device may be suitable Depth adequate 25.8 mm 3.  Normal PV anatomy see measurements above 4.  Moderate bi atrial enlargement 5.  Normal ascending thoracic aorta 3.3 cm 6.  No pericardial effusion 7.  No ASD/PFO Electronically Signed   By: Maude Delford HERO.D.  On: 12/18/2023 13:07    Recent Labs: Lab Results  Component Value Date   WBC 6.1 12/04/2023   HGB 11.6 (L) 12/04/2023   PLT 153 12/04/2023   NA 140 12/04/2023   K 3.6 12/04/2023   CL 94 (L) 12/04/2023   CO2 28 12/04/2023   GLUCOSE 92 12/04/2023   BUN 24 12/04/2023   CREATININE 4.53 (H) 12/04/2023   BILITOT 0.2 12/04/2023   ALKPHOS 146 (H) 12/04/2023   AST 25 12/04/2023   ALT 22 12/04/2023   PROT 6.9 12/04/2023   ALBUMIN 4.1 12/04/2023   CALCIUM  9.5 12/04/2023   GFRAA 27 (L) 06/14/2021    Speciality Comments: No specialty comments available.  Procedures:  No procedures performed Allergies: Patient has no known allergies.    Assessment / Plan:     Visit Diagnoses: Seronegative rheumatoid arthritis (HCC) -  No synovitis was noted on examination today.  He has not had any signs or symptoms of a rheumatoid arthritis flare.  He has clinically been doing well taking Arava  10 mg 1 tablet by mouth daily.  He is tolerating Arava  without any side effects and has not had any recent gaps in therapy.  He continues to have close lab monitoring and is currently on dialysis on Monday, Wednesday, and Fridays.  No medication changes will be made at this time.  He was advised to notify us  if he develops signs or symptoms of a flare.  He will follow up in the office in 5 months or sooner if needed.  Plan: leflunomide  (ARAVA ) 10 MG tablet  High risk medication use - Arava  10 mg 1 tablet by mouth daily.  Patient is currently on dialysis Monday, Wednesday, and Fridays. CBC and CMP updated on 12/04/23.   His next lab work will be  due in March and every 3 months. No recent or recurrent infections.  Discussed the importance of holding arava  if he develops signs or symptoms of an infection and to resume once the infection has completely cleared.   Stiffness of both knees: No warmth or effusion noted.  He experiences an aching sensation in the left knee at night intermittently.  No active inflammation at this time.  Primary osteoarthritis of both hands: He has CMC joint, PIP, and DIP thickening consistent with osteoarthritis of both hands.  No synovitis noted.    Degeneration of intervertebral disc of lumbar region without discogenic back pain or lower extremity pain - Previously followed by Dr. Lucilla.facet joint injections on 09/09/2022. radiofrequency ablation in the lumbar spine on 10/15/2022 performed by Dr. Eldonna --provided significant relief but he started to have a recurrence of discomfort in his lower back if standing for prolonged periods of time.  He has no symptoms of radiculopathy at this time.  Patient plans on following up with Dr. Eldonna if his symptoms persist or worsen.  Idiopathic chronic gout of multiple sites without tophus - He has not had any signs or symptoms of a gout flare.  He has clinically been doing well taking allopurinol  50 mg every other day-cleared to continue per Dr. Rachele.  He takes Mitigare  very sparingly if he develops signs or symptoms of a flare but has not needed to take it recently.  ESRD (end stage renal disease) (HCC) - Followed closely by nephrology-by Dr. Rachele.  Patient is receiving dialysis on Monday, Wednesday, and Fridays.  History of CVA (cerebrovascular accident): 09/11/2023--presented to ED with left-sided numbness and weakness--given TNK.  Currently taking aspirin  81 mg daily.  Completed  home therapy.    Other medical conditions are listed as follows:  History of hypercholesterolemia  History of hypertension: Blood pressure was 168/83 today in the office.  Patient was  advised to monitor blood pressure closely and to reach out to PCP if it remains elevated.  History of gastroesophageal reflux (GERD)  History of coronary artery disease  History of prostate cancer  History of renal insufficiency syndrome  Osteoporosis screening - DEXA scan from February 08, 2021 was normal.    Orders: No orders of the defined types were placed in this encounter.  Meds ordered this encounter  Medications   DISCONTD: leflunomide  (ARAVA ) 10 MG tablet    Sig: Take 1 tablet (10 mg total) by mouth daily.    Dispense:  90 tablet    Refill:  0   leflunomide  (ARAVA ) 10 MG tablet    Sig: Take 1 tablet (10 mg total) by mouth daily.    Dispense:  90 tablet    Refill:  0    Follow-Up Instructions: Return in about 5 months (around 05/17/2024) for Rheumatoid arthritis, Fibromyalgia, Gout.   Waddell CHRISTELLA Craze, PA-C  Note - This record has been created using Dragon software.  Chart creation errors have been sought, but may not always  have been located. Such creation errors do not reflect on  the standard of medical care.

## 2023-12-05 DIAGNOSIS — N186 End stage renal disease: Secondary | ICD-10-CM | POA: Diagnosis not present

## 2023-12-05 DIAGNOSIS — Z992 Dependence on renal dialysis: Secondary | ICD-10-CM | POA: Diagnosis not present

## 2023-12-05 DIAGNOSIS — N2581 Secondary hyperparathyroidism of renal origin: Secondary | ICD-10-CM | POA: Diagnosis not present

## 2023-12-05 LAB — CBC WITH DIFFERENTIAL/PLATELET
Basophils Absolute: 0.1 10*3/uL (ref 0.0–0.2)
Basos: 1 %
EOS (ABSOLUTE): 0.3 10*3/uL (ref 0.0–0.4)
Eos: 5 %
Hematocrit: 36.8 % — ABNORMAL LOW (ref 37.5–51.0)
Hemoglobin: 11.6 g/dL — ABNORMAL LOW (ref 13.0–17.7)
Immature Grans (Abs): 0 10*3/uL (ref 0.0–0.1)
Immature Granulocytes: 0 %
Lymphocytes Absolute: 1 10*3/uL (ref 0.7–3.1)
Lymphs: 16 %
MCH: 29.4 pg (ref 26.6–33.0)
MCHC: 31.5 g/dL (ref 31.5–35.7)
MCV: 93 fL (ref 79–97)
Monocytes Absolute: 0.8 10*3/uL (ref 0.1–0.9)
Monocytes: 14 %
Neutrophils Absolute: 3.9 10*3/uL (ref 1.4–7.0)
Neutrophils: 64 %
Platelets: 153 10*3/uL (ref 150–450)
RBC: 3.94 x10E6/uL — ABNORMAL LOW (ref 4.14–5.80)
RDW: 14 % (ref 11.6–15.4)
WBC: 6.1 10*3/uL (ref 3.4–10.8)

## 2023-12-05 LAB — CMP14+EGFR
ALT: 22 [IU]/L (ref 0–44)
AST: 25 [IU]/L (ref 0–40)
Albumin: 4.1 g/dL (ref 3.8–4.8)
Alkaline Phosphatase: 146 [IU]/L — ABNORMAL HIGH (ref 44–121)
BUN/Creatinine Ratio: 5 — ABNORMAL LOW (ref 10–24)
BUN: 24 mg/dL (ref 8–27)
Bilirubin Total: 0.2 mg/dL (ref 0.0–1.2)
CO2: 28 mmol/L (ref 20–29)
Calcium: 9.5 mg/dL (ref 8.6–10.2)
Chloride: 94 mmol/L — ABNORMAL LOW (ref 96–106)
Creatinine, Ser: 4.53 mg/dL — ABNORMAL HIGH (ref 0.76–1.27)
Globulin, Total: 2.8 g/dL (ref 1.5–4.5)
Glucose: 92 mg/dL (ref 70–99)
Potassium: 3.6 mmol/L (ref 3.5–5.2)
Sodium: 140 mmol/L (ref 134–144)
Total Protein: 6.9 g/dL (ref 6.0–8.5)
eGFR: 12 mL/min/{1.73_m2} — ABNORMAL LOW (ref >59)

## 2023-12-05 LAB — URIC ACID: Uric Acid: 3.1 mg/dL — ABNORMAL LOW (ref 3.8–8.4)

## 2023-12-05 NOTE — Progress Notes (Signed)
Uric acid is in the desirable range.  Creatinine remains elevated but better.  Hemoglobin is low and stable.  Please forward results to his PCP and nephrologist.

## 2023-12-07 DIAGNOSIS — N186 End stage renal disease: Secondary | ICD-10-CM | POA: Diagnosis not present

## 2023-12-07 DIAGNOSIS — Z992 Dependence on renal dialysis: Secondary | ICD-10-CM | POA: Diagnosis not present

## 2023-12-07 DIAGNOSIS — N2581 Secondary hyperparathyroidism of renal origin: Secondary | ICD-10-CM | POA: Diagnosis not present

## 2023-12-09 DIAGNOSIS — N2581 Secondary hyperparathyroidism of renal origin: Secondary | ICD-10-CM | POA: Diagnosis not present

## 2023-12-09 DIAGNOSIS — N186 End stage renal disease: Secondary | ICD-10-CM | POA: Diagnosis not present

## 2023-12-09 DIAGNOSIS — Z992 Dependence on renal dialysis: Secondary | ICD-10-CM | POA: Diagnosis not present

## 2023-12-12 DIAGNOSIS — N2581 Secondary hyperparathyroidism of renal origin: Secondary | ICD-10-CM | POA: Diagnosis not present

## 2023-12-12 DIAGNOSIS — N186 End stage renal disease: Secondary | ICD-10-CM | POA: Diagnosis not present

## 2023-12-12 DIAGNOSIS — Z992 Dependence on renal dialysis: Secondary | ICD-10-CM | POA: Diagnosis not present

## 2023-12-14 DIAGNOSIS — Z992 Dependence on renal dialysis: Secondary | ICD-10-CM | POA: Diagnosis not present

## 2023-12-14 DIAGNOSIS — N2581 Secondary hyperparathyroidism of renal origin: Secondary | ICD-10-CM | POA: Diagnosis not present

## 2023-12-14 DIAGNOSIS — N186 End stage renal disease: Secondary | ICD-10-CM | POA: Diagnosis not present

## 2023-12-16 ENCOUNTER — Ambulatory Visit (HOSPITAL_COMMUNITY): Payer: Medicare HMO

## 2023-12-16 ENCOUNTER — Telehealth (HOSPITAL_COMMUNITY): Payer: Self-pay | Admitting: *Deleted

## 2023-12-16 DIAGNOSIS — Z992 Dependence on renal dialysis: Secondary | ICD-10-CM | POA: Diagnosis not present

## 2023-12-16 DIAGNOSIS — N186 End stage renal disease: Secondary | ICD-10-CM | POA: Diagnosis not present

## 2023-12-16 DIAGNOSIS — N2581 Secondary hyperparathyroidism of renal origin: Secondary | ICD-10-CM | POA: Diagnosis not present

## 2023-12-16 NOTE — Telephone Encounter (Signed)
 Reaching out to patient to offer assistance regarding upcoming cardiac imaging study; pt verbalizes understanding of appt date/time, parking situation and where to check in, pre-test NPO status and verified current allergies; name and call back number provided for further questions should they arise  Larey Brick RN Navigator Cardiac Imaging [facility] [phone] office [phone] cell  Patient aware to arrive at 11:30 AM.

## 2023-12-17 DIAGNOSIS — N186 End stage renal disease: Secondary | ICD-10-CM | POA: Diagnosis not present

## 2023-12-17 DIAGNOSIS — Z992 Dependence on renal dialysis: Secondary | ICD-10-CM | POA: Diagnosis not present

## 2023-12-18 ENCOUNTER — Encounter: Payer: Self-pay | Admitting: Physician Assistant

## 2023-12-18 ENCOUNTER — Ambulatory Visit: Payer: Medicare HMO | Admitting: Physician Assistant

## 2023-12-18 ENCOUNTER — Ambulatory Visit (HOSPITAL_COMMUNITY)
Admission: RE | Admit: 2023-12-18 | Discharge: 2023-12-18 | Disposition: A | Discharge status: Home / Self Care | Payer: Medicare HMO | Source: Ambulatory Visit | Attending: Cardiology | Admitting: Cardiology

## 2023-12-18 VITALS — BP 168/83 | HR 75 | Resp 14 | Ht 70.0 in | Wt 158.0 lb

## 2023-12-18 DIAGNOSIS — Z79899 Other long term (current) drug therapy: Secondary | ICD-10-CM

## 2023-12-18 DIAGNOSIS — Z87448 Personal history of other diseases of urinary system: Secondary | ICD-10-CM | POA: Diagnosis present

## 2023-12-18 DIAGNOSIS — Z8639 Personal history of other endocrine, nutritional and metabolic disease: Secondary | ICD-10-CM

## 2023-12-18 DIAGNOSIS — M25662 Stiffness of left knee, not elsewhere classified: Secondary | ICD-10-CM | POA: Insufficient documentation

## 2023-12-18 DIAGNOSIS — Z13820 Encounter for screening for osteoporosis: Secondary | ICD-10-CM

## 2023-12-18 DIAGNOSIS — Z8546 Personal history of malignant neoplasm of prostate: Secondary | ICD-10-CM

## 2023-12-18 DIAGNOSIS — M19041 Primary osteoarthritis, right hand: Secondary | ICD-10-CM | POA: Insufficient documentation

## 2023-12-18 DIAGNOSIS — N186 End stage renal disease: Secondary | ICD-10-CM | POA: Insufficient documentation

## 2023-12-18 DIAGNOSIS — I517 Cardiomegaly: Secondary | ICD-10-CM | POA: Diagnosis not present

## 2023-12-18 DIAGNOSIS — Z8673 Personal history of transient ischemic attack (TIA), and cerebral infarction without residual deficits: Secondary | ICD-10-CM | POA: Diagnosis present

## 2023-12-18 DIAGNOSIS — I480 Paroxysmal atrial fibrillation: Secondary | ICD-10-CM | POA: Insufficient documentation

## 2023-12-18 DIAGNOSIS — M51369 Other intervertebral disc degeneration, lumbar region without mention of lumbar back pain or lower extremity pain: Secondary | ICD-10-CM

## 2023-12-18 DIAGNOSIS — M0600 Rheumatoid arthritis without rheumatoid factor, unspecified site: Secondary | ICD-10-CM

## 2023-12-18 DIAGNOSIS — M25661 Stiffness of right knee, not elsewhere classified: Secondary | ICD-10-CM | POA: Insufficient documentation

## 2023-12-18 DIAGNOSIS — Z8679 Personal history of other diseases of the circulatory system: Secondary | ICD-10-CM

## 2023-12-18 DIAGNOSIS — M1A09X0 Idiopathic chronic gout, multiple sites, without tophus (tophi): Secondary | ICD-10-CM | POA: Diagnosis not present

## 2023-12-18 DIAGNOSIS — Z8719 Personal history of other diseases of the digestive system: Secondary | ICD-10-CM | POA: Diagnosis present

## 2023-12-18 DIAGNOSIS — M19042 Primary osteoarthritis, left hand: Secondary | ICD-10-CM | POA: Diagnosis not present

## 2023-12-18 DIAGNOSIS — I2510 Atherosclerotic heart disease of native coronary artery without angina pectoris: Secondary | ICD-10-CM | POA: Diagnosis not present

## 2023-12-18 MED ORDER — LEFLUNOMIDE 10 MG PO TABS: 10.0000 mg | ORAL_TABLET | Freq: Every day | ORAL | 0 refills | Status: DC

## 2023-12-18 MED ORDER — LEFLUNOMIDE 10 MG PO TABS
10.0000 mg | ORAL_TABLET | Freq: Every day | ORAL | 0 refills | Status: DC
Start: 2023-12-18 — End: 2023-12-18

## 2023-12-18 MED ORDER — IOHEXOL 350 MG/ML SOLN
100.0000 mL | Freq: Once | INTRAVENOUS | Status: AC | PRN
  Administered 2023-12-18: 100 mL via INTRAVENOUS

## 2023-12-18 NOTE — Patient Instructions (Signed)
 Standing Labs We placed an order today for your standing lab work.   Please have your standing labs drawn in March and every 3 months   Please have your labs drawn 2 weeks prior to your appointment so that the provider can discuss your lab results at your appointment, if possible.  Please note that you may see your imaging and lab results in MyChart before we have reviewed them. We will contact you once all results are reviewed. Please allow our office up to 72 hours to thoroughly review all of the results before contacting the office for clarification of your results.  WALK-IN LAB HOURS  Monday through Thursday from 8:00 am -12:30 pm and 1:00 pm-5:00 pm and Friday from 8:00 am-12:00 pm.  Patients with office visits requiring labs will be seen before walk-in labs.  You may encounter longer than normal wait times. Please allow additional time. Wait times may be shorter on  Monday and Thursday afternoons.  We do not book appointments for walk-in labs. We appreciate your patience and understanding with our staff.   Labs are drawn by [facility]. Please bring your co-pay at the time of your lab draw.  You may receive a bill from [facility] for your lab work.  Please note if you are on Hydroxychloroquine and and an order has been placed for a Hydroxychloroquine level,  you will need to have it drawn 4 hours or more after your last dose.  If you wish to have your labs drawn at another location, please call the office 24 hours in advance so we can fax the orders.  The office is located at [address], [address], [place], [place] [address]   If you have any questions regarding directions or hours of operation,  please call [phone].   As a reminder, please drink plenty of water prior to coming for your lab work. Thanks!

## 2023-12-19 ENCOUNTER — Telehealth: Payer: Self-pay

## 2023-12-19 DIAGNOSIS — N2581 Secondary hyperparathyroidism of renal origin: Secondary | ICD-10-CM | POA: Diagnosis not present

## 2023-12-19 DIAGNOSIS — I4891 Unspecified atrial fibrillation: Secondary | ICD-10-CM | POA: Diagnosis not present

## 2023-12-19 DIAGNOSIS — N186 End stage renal disease: Secondary | ICD-10-CM | POA: Diagnosis not present

## 2023-12-19 DIAGNOSIS — Z992 Dependence on renal dialysis: Secondary | ICD-10-CM | POA: Diagnosis not present

## 2023-12-19 NOTE — Telephone Encounter (Signed)
 Marland Kitchen

## 2023-12-22 DIAGNOSIS — N186 End stage renal disease: Secondary | ICD-10-CM | POA: Diagnosis not present

## 2023-12-22 DIAGNOSIS — N2581 Secondary hyperparathyroidism of renal origin: Secondary | ICD-10-CM | POA: Diagnosis not present

## 2023-12-22 DIAGNOSIS — Z992 Dependence on renal dialysis: Secondary | ICD-10-CM | POA: Diagnosis not present

## 2023-12-23 ENCOUNTER — Other Ambulatory Visit: Payer: Self-pay

## 2023-12-23 NOTE — Patient Outreach (Signed)
 Patient returned call, obtained mRS was successfully. MRS= 1  [facility] Care Management Assistant [phone]

## 2023-12-23 NOTE — Patient Outreach (Signed)
 First telephone outreach attempt to obtain mRS. No answer. Left message for returned call.  Vanice Sarah Care Management Assistant [phone]

## 2023-12-24 DIAGNOSIS — Z992 Dependence on renal dialysis: Secondary | ICD-10-CM | POA: Diagnosis not present

## 2023-12-24 DIAGNOSIS — N186 End stage renal disease: Secondary | ICD-10-CM | POA: Diagnosis not present

## 2023-12-24 DIAGNOSIS — N2581 Secondary hyperparathyroidism of renal origin: Secondary | ICD-10-CM | POA: Diagnosis not present

## 2023-12-25 ENCOUNTER — Ambulatory Visit: Payer: Medicare HMO | Attending: Student | Admitting: Student

## 2023-12-25 ENCOUNTER — Encounter: Payer: Self-pay | Admitting: Student

## 2023-12-25 ENCOUNTER — Ambulatory Visit: Payer: Medicare HMO

## 2023-12-25 VITALS — BP 158/82 | HR 82 | Ht 70.0 in | Wt 158.0 lb

## 2023-12-25 DIAGNOSIS — I2510 Atherosclerotic heart disease of native coronary artery without angina pectoris: Secondary | ICD-10-CM | POA: Diagnosis not present

## 2023-12-25 DIAGNOSIS — I480 Paroxysmal atrial fibrillation: Secondary | ICD-10-CM | POA: Diagnosis not present

## 2023-12-25 DIAGNOSIS — E785 Hyperlipidemia, unspecified: Secondary | ICD-10-CM

## 2023-12-25 DIAGNOSIS — D649 Anemia, unspecified: Secondary | ICD-10-CM

## 2023-12-25 DIAGNOSIS — Z8673 Personal history of transient ischemic attack (TIA), and cerebral infarction without residual deficits: Secondary | ICD-10-CM

## 2023-12-25 DIAGNOSIS — N186 End stage renal disease: Secondary | ICD-10-CM | POA: Diagnosis not present

## 2023-12-25 DIAGNOSIS — Z992 Dependence on renal dialysis: Secondary | ICD-10-CM | POA: Diagnosis not present

## 2023-12-25 DIAGNOSIS — I10 Essential (primary) hypertension: Secondary | ICD-10-CM | POA: Diagnosis not present

## 2023-12-25 NOTE — Patient Instructions (Signed)
 Medication Instructions:   Your physician recommends that you continue on your current medications as directed. Please refer to the Current Medication list given to you today.   Labwork: None today  Testing/Procedures: None today  Follow-Up: 6 months with Dr.Branch or [place] Strader,PA-C  Any Other Special Instructions Will Be Listed Below (If Applicable).  If you need a refill on your cardiac medications before your next appointment, please call your pharmacy.

## 2023-12-25 NOTE — Progress Notes (Signed)
 Cardiology Office Note    Date:  12/25/2023  ID:  Jacob Farmer, DOB 05/21/1946, MRN [id number] Cardiologist: Alvan Carrier, MD   EP: Dr. Kennyth  History of Present Illness:    Jacob Farmer is a 78 y.o. male with past medical history of CAD (s/p stents in 1999 to LCx and RCA with CTO of LAD), paroxysmal atrial fibrillation, tachy-brady syndrome, HFpEF, orthostatic hypotension, HTN, HLD, Type 2 DM, RA, OA, history of GIB (known AVMs and duodenal ulcers), history of CVA (occurring in 08/2023), history of prostate cancer and ESRD who presents to the office today for 1-month follow-up.  He was examined by Tylene Lunch, NP in 09/2023 and had recently been hospitalized for a stroke which was complicated by receiving TNK and developing an ICH. He was evaluated by GI and ultimately started on ASA with plans to consider Watchman device placement as an outpatient as he had been off anticoagulation given history of GIB. At the time of his office visit, he was overall feeling well and had been undergoing dialysis. Given no recent anginal symptoms, he was not interested in repeat stress testing and was continued on ASA and Crestor . He was referred back to EP for consideration of Watchman device placement.  He did meet with Dr. Kennyth in 11/2023 for consideration of this and was felt to be a candidate for LAA appendage closure. Was recommended to proceed with CT imaging to assess candidacy for this and a Zio patch was also recommended to assess his atrial fibrillation burden. CT was obtained on 12/18/2023 and showed an elevated calcium  score of 1740 which was 82nd percentile for age and sex and sizing was obtained for consideration of Watchman. Event monitor also resulted what showed an average heart rate of 72 bpm. He did have 2 episodes of NSVT with the longest lasting for 6 beats and was noted to have atrial flutter or coarse atrial fibrillation with the longest episode being 11 minutes.   In talking with  the patient and his wife today, he reports overall doing well since his last visit. Reports his strength and stamina are starting to improve. He denies any specific chest pain or palpitations. Was overall asymptomatic while wearing his event monitor. No recent dyspnea on exertion, orthopnea, PND or pitting edema. Reports he was previously feeling very weak after dialysis sessions but this is starting to improve.  BP was initially recorded at 194/90 at the time of today's visit and rechecked and improved to 158/82.  Checked orthostatics and BP dropped to 130/80 with standing but he was asymptomatic at that time. He remains on ASA alone for now and denies any recent melena, hematochezia or hematuria.  Studies Reviewed:   EKG: EKG is not ordered today.  Echocardiogram: 08/2023 IMPRESSIONS     1. Left ventricular ejection fraction, by estimation, is 60 to 65%. The  left ventricle has normal function. The left ventricle demonstrates  regional wall motion abnormalities (see scoring diagram/findings for  description). Left ventricular diastolic  parameters are consistent with Grade I diastolic dysfunction (impaired  relaxation).   2. Right ventricular systolic function is normal. The right ventricular  size is normal. There is normal pulmonary artery systolic pressure.   3. The mitral valve is normal in structure. No evidence of mitral valve  regurgitation. No evidence of mitral stenosis.   4. The aortic valve is tricuspid. Aortic valve regurgitation is not  visualized. No aortic stenosis is present.   5. Aortic dilatation noted. There is  mild dilatation of the aortic root,  measuring 41 mm.   6. The inferior vena cava is normal in size with greater than 50%  respiratory variability, suggesting right atrial pressure of 3 mmHg.    Event Monitor: 12/2023 Patch Wear Time:  13 days and 23 hours (2024-12-14T10:31:14-0500 to 2024-12-28T10:01:24-0500)   HR 33 - 146, average 72 bpm. 2 nonsustained  VT (longest 6 beats) Atrial flutter and/or coarse atrial fibrillation detected. Longest episode was 11 minutes and 23 seconds. Occasional supraventricular ectopy, 2.5%. Rare ventricular ectopy. Nocturnal Mobitz 1 AV block was seen. No symptom trigger episodes.  Risk Assessment/Calculations:    CHA2DS2-VASc Score = 6   This indicates a 9.7% annual risk of stroke. The patient's score is based upon: CHF History: 1 HTN History: 1 Diabetes History: 1 Stroke History: 0 Vascular Disease History: 1 Age Score: 2 Gender Score: 0    HYPERTENSION CONTROL Vitals:   12/25/23 1247 12/25/23 1315  BP: (!) 194/90 (!) 158/82    The patient's blood pressure is elevated above target today.  In order to address the patient's elevated BP: Blood pressure will be monitored at home to determine if medication changes need to be made.       Physical Exam:   VS:  BP (!) 158/82   Pulse 82   Ht 5' 10 (1.778 m)   Wt 158 lb (71.7 kg)   SpO2 99%   BMI 22.67 kg/m    Wt Readings from Last 3 Encounters:  12/25/23 158 lb (71.7 kg)  12/18/23 158 lb (71.7 kg)  11/25/23 155 lb 3.2 oz (70.4 kg)     GEN: Well nourished, well developed male appearing in no acute distress NECK: No JVD; No carotid bruits CARDIAC: RRR, no murmurs, rubs, gallops RESPIRATORY:  Clear to auscultation without rales, wheezing or rhonchi  ABDOMEN: Appears non-distended. No obvious abdominal masses. EXTREMITIES: No clubbing or cyanosis. No pitting edema.  Distal pedal pulses are 2+ bilaterally.   Assessment and Plan:   1. CAD - He is s/p stents in 1999 to LCx and RCA with CTO of LAD. He has been increasing his activity and denies any recent anginal symptoms. Continue current medical therapy with ASA 81 mg daily and Crestor  40 mg daily.  2. Paroxysmal Atrial Fibrillation complicated by Tachy-brady Syndrome - He has known paroxysmal atrial fibrillation and recent monitor showed brief atrial fibrillation/flutter with the  longest episode lasting for 11 minutes and occasional supraventricular ectopy with an overall burden of 2.5%. He has not been on AV nodal blocking agents given baseline bradycardia and average heart rate while wearing the monitor was at 72 bpm with episodes of nocturnal Mobitz 1 AV block noted.   - He has been off anticoagulation given prior GI bleeding requiring transfusion and unfortunately did suffer a CVA last year which was complicated after receiving TNK and developing an ICH. He is being followed by Dr. Kennyth for consideration of Watchman device placement and has follow-up later this month to review.  3. HTN with Orthostatic Hypotension - As discussed above, his BP was significantly elevated upon arrival today, rechecked and improved to 158/82. Orthostatics were checked and SBP dropped to 130 but he denied any associated symptoms. At this time, would not further titrate medical therapy given his orthostasis. Continue Amlodipine  5 mg daily. If he develops symptoms or this worsens, the amount of fluid removal at dialysis may need to be adjusted by Nephrology.  4. HLD - LDL was at 41 in 08/2023. Continue  current medical therapy with Crestor  40 mg daily.  5. Anemia - His hemoglobin had improved to 11.6 when checked in 11/2023. No reports of active bleeding.  6. Prior CVA - He has been continued on ASA 81 mg daily and Crestor  40 mg daily. He does have follow-up with Neurology later this month.  7. ESRD - On HD - MWF schedule. Followed by Dr. Rachele.    Signed, Laymon CHRISTELLA Qua, PA-C

## 2023-12-26 DIAGNOSIS — N2581 Secondary hyperparathyroidism of renal origin: Secondary | ICD-10-CM | POA: Diagnosis not present

## 2023-12-26 DIAGNOSIS — Z992 Dependence on renal dialysis: Secondary | ICD-10-CM | POA: Diagnosis not present

## 2023-12-26 DIAGNOSIS — N186 End stage renal disease: Secondary | ICD-10-CM | POA: Diagnosis not present

## 2023-12-29 DIAGNOSIS — N186 End stage renal disease: Secondary | ICD-10-CM | POA: Diagnosis not present

## 2023-12-29 DIAGNOSIS — N2581 Secondary hyperparathyroidism of renal origin: Secondary | ICD-10-CM | POA: Diagnosis not present

## 2023-12-29 DIAGNOSIS — Z992 Dependence on renal dialysis: Secondary | ICD-10-CM | POA: Diagnosis not present

## 2023-12-31 DIAGNOSIS — N186 End stage renal disease: Secondary | ICD-10-CM | POA: Diagnosis not present

## 2023-12-31 DIAGNOSIS — N2581 Secondary hyperparathyroidism of renal origin: Secondary | ICD-10-CM | POA: Diagnosis not present

## 2023-12-31 DIAGNOSIS — Z992 Dependence on renal dialysis: Secondary | ICD-10-CM | POA: Diagnosis not present

## 2024-01-01 ENCOUNTER — Other Ambulatory Visit: Payer: Medicare HMO

## 2024-01-01 DIAGNOSIS — C61 Malignant neoplasm of prostate: Secondary | ICD-10-CM | POA: Diagnosis not present

## 2024-01-02 DIAGNOSIS — N2581 Secondary hyperparathyroidism of renal origin: Secondary | ICD-10-CM | POA: Diagnosis not present

## 2024-01-02 DIAGNOSIS — N186 End stage renal disease: Secondary | ICD-10-CM | POA: Diagnosis not present

## 2024-01-02 DIAGNOSIS — Z992 Dependence on renal dialysis: Secondary | ICD-10-CM | POA: Diagnosis not present

## 2024-01-02 LAB — TESTOSTERONE: Testosterone: <3 ng/dL — ABNORMAL LOW (ref 264–916)

## 2024-01-02 LAB — PSA: Prostate Specific Ag, Serum: 0.1 ng/mL (ref 0.0–4.0)

## 2024-01-05 DIAGNOSIS — N2581 Secondary hyperparathyroidism of renal origin: Secondary | ICD-10-CM | POA: Diagnosis not present

## 2024-01-05 DIAGNOSIS — N186 End stage renal disease: Secondary | ICD-10-CM | POA: Diagnosis not present

## 2024-01-05 DIAGNOSIS — Z992 Dependence on renal dialysis: Secondary | ICD-10-CM | POA: Diagnosis not present

## 2024-01-06 ENCOUNTER — Telehealth (HOSPITAL_COMMUNITY): Payer: Self-pay | Admitting: *Deleted

## 2024-01-06 ENCOUNTER — Ambulatory Visit: Payer: Medicare HMO | Attending: Cardiology | Admitting: Cardiology

## 2024-01-06 ENCOUNTER — Ambulatory Visit: Payer: Medicare HMO | Admitting: Neurology

## 2024-01-06 ENCOUNTER — Encounter: Payer: Self-pay | Admitting: Cardiology

## 2024-01-06 ENCOUNTER — Encounter: Payer: Self-pay | Admitting: Neurology

## 2024-01-06 VITALS — BP 138/90 | HR 76 | Ht 70.0 in | Wt 158.0 lb

## 2024-01-06 VITALS — BP 174/93 | HR 75 | Ht 69.0 in | Wt 157.8 lb

## 2024-01-06 DIAGNOSIS — Z8673 Personal history of transient ischemic attack (TIA), and cerebral infarction without residual deficits: Secondary | ICD-10-CM | POA: Diagnosis not present

## 2024-01-06 DIAGNOSIS — N186 End stage renal disease: Secondary | ICD-10-CM | POA: Diagnosis not present

## 2024-01-06 DIAGNOSIS — R29898 Other symptoms and signs involving the musculoskeletal system: Secondary | ICD-10-CM | POA: Diagnosis not present

## 2024-01-06 DIAGNOSIS — I482 Chronic atrial fibrillation, unspecified: Secondary | ICD-10-CM | POA: Diagnosis not present

## 2024-01-06 DIAGNOSIS — Z992 Dependence on renal dialysis: Secondary | ICD-10-CM

## 2024-01-06 DIAGNOSIS — Z8719 Personal history of other diseases of the digestive system: Secondary | ICD-10-CM | POA: Diagnosis not present

## 2024-01-06 DIAGNOSIS — D6869 Other thrombophilia: Secondary | ICD-10-CM

## 2024-01-06 DIAGNOSIS — I480 Paroxysmal atrial fibrillation: Secondary | ICD-10-CM

## 2024-01-06 DIAGNOSIS — I6389 Other cerebral infarction: Secondary | ICD-10-CM | POA: Diagnosis not present

## 2024-01-06 NOTE — Patient Instructions (Signed)
 Medication Instructions:  Your physician recommends that you continue on your current medications as directed. Please refer to the Current Medication list given to you today.  *If you need a refill on your cardiac medications before your next appointment, please call your pharmacy*  Follow-Up: At [facility], you and your health needs are our priority.  As part of our continuing mission to provide you with exceptional heart care, we have created designated Provider Care Teams.  These Care Teams include your primary Cardiologist (physician) and Advanced Practice Providers (APPs -  Physician Assistants and Nurse Practitioners) who all work together to provide you with the care you need, when you need it.  Your next appointment:   3 months  Provider:   Nobie Putnam, MD

## 2024-01-06 NOTE — Progress Notes (Unsigned)
[facility] [address] [place] [place] [address] [phone]       OFFICE FOLLOW-UP NOTE  [name] Date of Birth:  03-03-1946 Medical Record Number:  [id number]   HPI:  ROS:   14 system review of systems is positive for ***  PMH:  Past Medical History:  Diagnosis Date   Acute CVA (cerebrovascular accident) (HCC) 09/11/2023   CAD (coronary artery disease)    Acute myocardial infarction treated with TPA in 1990; 1999-stents to circumflex and RCA; residual total occlusion of the left anterior descending; normal ejection fraction; stress nuclear in 2001-distal anteroseptal ischemia; normal LV function   CKD (chronic kidney disease) stage 4, GFR 15-29 ml/min (HCC)    First degree heart block    History of gastric ulcer    History of kidney stones    History of MI (myocardial infarction)    1990-  treated w/ TPA   Hyperlipidemia    Hypertension    Jaundice    OA (osteoarthritis)    Prediabetes    Prostate cancer (HCC)    Stage T1c , Gleason 3+4,  PSA 4.7,  vol 24cc--  scheduled for radiative seed implants   Psoriatic arthritis (HCC)    RA (rheumatoid arthritis) (HCC)    Dr. Beatrix Fetters   Sinus bradycardia    Wears dentures     Social History:  Social History   Socioeconomic History   Marital status: Married    Spouse name: Not on file   Number of children: 2   Years of education: Not on file   Highest education level: Not on file  Occupational History   Occupation: Retired Personnel officer  Tobacco Use   Smoking status: Former    Current packs/day: 0.00    Average packs/day: 1 pack/day for 30.0 years (30.0 ttl pk-yrs)    Types: Cigarettes    Start date: 05/22/1959    Quit date: 05/21/1989    Years since quitting: 34.6    Passive exposure: Never   Smokeless tobacco: Never  Vaping Use   Vaping status: Never Used  Substance and Sexual Activity   Alcohol use: Yes    Alcohol/week: 3.0 standard drinks of alcohol    Types: 3 Standard drinks  or equivalent per week   Drug use: No   Sexual activity: Not on file  Other Topics Concern   Not on file  Social History Narrative   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      Right handed   Wears readers   Drinks 1 cup in mornings   Drinks ginger ale   Social Drivers of Health   Financial Resource Strain: Low Risk  (11/05/2023)   Overall Financial Resource Strain (CARDIA)    Difficulty of Paying Living Expenses: Not very hard  Food Insecurity: No Food Insecurity (11/05/2023)   Hunger Vital Sign    Worried About Running Out of Food in the Last Year: Never true    Ran Out of Food in the Last Year: Never true  Transportation Needs: No Transportation Needs (11/05/2023)   PRAPARE - Administrator, Civil Service (Medical): No    Lack of Transportation (Non-Medical): No  Physical Activity: Sufficiently Active (11/05/2023)   Exercise Vital Sign    Days of Exercise per Week: 5 days    Minutes of Exercise per Session: 30 min  Stress: No Stress Concern Present (11/05/2023)   [employer] of Occupational Health - Occupational Stress Questionnaire    Feeling of Stress :  Not at all  Social Connections: Moderately Integrated (11/05/2023)   Social Connection and Isolation Panel [NHANES]    Frequency of Communication with Friends and Family: More than three times a week    Frequency of Social Gatherings with Friends and Family: Three times a week    Attends Religious Services: More than 4 times per year    Active Member of Clubs or Organizations: No    Attends Banker Meetings: Never    Marital Status: Married  Catering manager Violence: Not At Risk (11/05/2023)   Humiliation, Afraid, Rape, and Kick questionnaire    Fear of Current or Ex-Partner: No    Emotionally Abused: No    Physically Abused: No    Sexually Abused: No    Medications:   Current Outpatient Medications on File Prior to Visit  Medication Sig Dispense Refill   allopurinol (ZYLOPRIM)  100 MG tablet TAKE 1/2 TABLET EVERY OTHER DAY 45 tablet 0   amLODipine (NORVASC) 5 MG tablet Take 1 tablet (5 mg total) by mouth daily. 90 tablet 1   aspirin EC 81 MG tablet Take 1 tablet (81 mg total) by mouth daily. Swallow whole. 30 tablet 12   calcitRIOL (ROCALTROL) 0.25 MCG capsule Take 0.25 mcg by mouth every Monday, Wednesday, and Friday.     Dupilumab (DUPIXENT) 300 MG/2ML SOAJ Inject 300 mg into the skin every 14 (fourteen) days. Starting at day 15 for maintenance. 12 mL 0   folic acid (FOLVITE) 400 MCG tablet Take 400 mcg by mouth daily.     leflunomide (ARAVA) 10 MG tablet Take 1 tablet (10 mg total) by mouth daily. 90 tablet 0   Leuprolide Acetate (ELIGARD [place]) Inject 1 Dose into the skin every 4 (four) months.     pantoprazole (PROTONIX) 40 MG tablet TAKE 1 TABLET TWICE DAILY 180 tablet 1   RENVELA 800 MG tablet Take 800 mg by mouth 2 (two) times daily with a meal.     rosuvastatin (CRESTOR) 40 MG tablet Take 1 tablet (40 mg total) by mouth daily. 90 tablet 3   triamcinolone cream (KENALOG) 0.1 % Apply 1 Application topically daily. (Patient taking differently: Apply 1 Application topically daily as needed (irritation).)     acetaminophen (TYLENOL) 325 MG tablet Take 1-2 tablets (325-650 mg total) by mouth every 4 (four) hours as needed for mild pain. (Patient not taking: Reported on 01/06/2024)     No current facility-administered medications on file prior to visit.    Allergies:  No Known Allergies  Physical Exam General: well developed, well nourished, seated, in no evident distress Head: head normocephalic and atraumatic.  Neck: supple with no carotid or supraclavicular bruits Cardiovascular: regular rate and rhythm, no murmurs Musculoskeletal: no deformity Skin:  no rash/petichiae Vascular:  Normal pulses all extremities Vitals:   01/06/24 1028 01/06/24 1039  BP: (!) 171/95 (!) 174/93  Pulse: 75 75   Neurologic Exam Mental Status: Awake and fully alert. Oriented to  place and time. Recent and remote memory intact. Attention span, concentration and fund of knowledge appropriate. Mood and affect appropriate.  Cranial Nerves: Fundoscopic exam reveals sharp disc margins. Pupils equal, briskly reactive to light. Extraocular movements full without nystagmus. Visual fields full to confrontation. Hearing intact. Facial sensation intact. Face, tongue, palate moves normally and symmetrically.  Motor: Normal bulk and tone. Normal strength in all tested extremity muscles. Sensory.: intact to touch ,pinprick .position and vibratory sensation.  Coordination: Rapid alternating movements normal in all extremities. Finger-to-nose and heel-to-shin  performed accurately bilaterally. Gait and Station: Arises from chair without difficulty. Stance is normal. Gait demonstrates normal stride length and balance . Able to heel, toe and tandem walk without difficulty.  Reflexes: 1+ and symmetric. Toes downgoing.   NIHSS  *** Modified Rankin  ***   ASSESSMENT: 78 year old Caucasian male with embolic right frontal MCA branch infarct and September 2026 complicated by hemorrhagic transformation following TNK with prior history of GI bleed on anticoagulation for atrial fibrillation.  Patient has done remarkably well with near complete recovery and no residual deficits.  Vascular risk factors of atrial fibrillation, hypertension and hyperlipidemia.     PLAN:I had a long d/w patient and his wife about his recent embolic stroke, thrombolysis with TNK and hemorrhagic transformation, chronic atrial fibrillation and history of GI bleed, risk for recurrent stroke/TIAs, personally independently reviewed imaging studies and stroke evaluation results and answered questions.Continue aspirin 81 mg daily  for secondary stroke prevention due to history of GI bleed on anticoagulation in the past and maintain strict control of hypertension with blood pressure goal below 130/90, diabetes with hemoglobin A1c  goal below 6.5% and lipids with LDL cholesterol goal below 70 mg/dL. I also advised the patient to eat a healthy diet with plenty of whole grains, cereals, fruits and vegetables, exercise regularly and maintain ideal body weight .I would strongly encourage him to consider the Watchman device due to his prior history of GI bleed is not a good long-term anticoagulation candidate.  He was advised to keep his appointment with Dr. Nobie Putnam today to discuss this further.  Patient was also neurologically cleared to undergo AV fistula surgery for his dialysis.  Followup in the future with my nurse practitioner in 6 months or call earlier if necessary.  Greater than 50% of time during this 35 minute visit was spent on counseling,explanation of diagnosis of embolic stroke, atrial fibrillation, history of GI bleed on anticoagulation, possible Watchman device consideration, planning of further management, discussion with patient and family and coordination of care Delia Heady, MD Note: This document was prepared with digital dictation and possible smart phrase technology. Any transcriptional errors that result from this process are unintentional

## 2024-01-06 NOTE — Progress Notes (Unsigned)
Electrophysiology Office Note:   Date:  01/07/2024  ID:  IMHOTEP SCORE, DOB 06/10/1946, MRN [id number]  Primary Cardiologist: Dina Rich, MD Electrophysiologist: Nobie Putnam, MD      History of Present Illness:   Jacob Farmer is a 78 y.o. male with h/o coronary disease, paroxysmal atrial fibrillation, tachybradycardia syndrome, HFpEF, type 2 diabetes, essential hypertension with bouts of orthostatic hypotension, rheumatoid and osteoarthritis, end-stage renal disease, prostate cancer, GI bleeding and stroke who was initially seen for evaluation for Watchman device implant. Patient was hospitalized in May 2020 for with acute blood loss anemia secondary to GI bleeding which required transfusion of 2 units of blood.  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  Hemoglobin remained stable and he was discharged off Eliquis.  Patient had recurrence of symptomatic anemia and was found to have a hemoglobin of 4.9 in July 2024 and again required transfusion of blood, this time 4 units PRBCs.  He remained off Eliquis after this visit.  He then presented in September 2024 to the ED with an acute stroke.  This was felt to be due to cardioembolic origin from his atrial fibrillation off oral anticoagulation.  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    Patient continues to report doing relatively well since last clinic visit.  He wore a ZIO monitor in the interim which did show paroxysmal atrial fibrillation.  He was asymptomatic with this.  He is doing dialysis 3 times a week.  He is getting more used to this but does report feeling quite fatigued after dialysis session.  He is planning to get an AV fistula.  This has not yet been scheduled.  Today, he has no new or acute complaints.  Review of systems complete and found to be negative unless listed in  HPI.   EP Information / Studies Reviewed:    EKG is not ordered today. EKG from 09/12/23 reviewed which showed sinus rhythm with RBBB.      Echo 09/12/23: 1. Left ventricular ejection fraction, by estimation, is 60 to 65%. The  left ventricle has normal function. The left ventricle demonstrates  regional wall motion abnormalities (see scoring diagram/findings for  description). Left ventricular diastolic  parameters are consistent with Grade I diastolic dysfunction (impaired  relaxation).   2. Right ventricular systolic function is normal. The right ventricular  size is normal. There is normal pulmonary artery systolic pressure.   3. The mitral valve is normal in structure. No evidence of mitral valve  regurgitation. No evidence of mitral stenosis.   4. The aortic valve is tricuspid. Aortic valve regurgitation is not  visualized. No aortic stenosis is present.   5. Aortic dilatation noted. There is mild dilatation of the aortic root,  measuring 41 mm.   6. The inferior vena cava is normal in size with greater than 50%  respiratory variability, suggesting right atrial pressure of 3 mmHg.    Zio 12/19/23:  HR 33 - 146, average 72 bpm. 2 nonsustained VT (longest 6 beats) Atrial flutter and/or coarse atrial fibrillation detected. Longest episode was 11 minutes and 23 seconds. Occasional supraventricular ectopy, 2.5%. Rare ventricular ectopy. Nocturnal Mobitz 1 AV block was seen. No symptom trigger episodes.  Risk Assessment/Calculations:    CHA2DS2-VASc Score = 6   This indicates a 9.7% annual risk of stroke. The patient's score is based upon:  CHF History: 1 HTN History: 1 Diabetes History: 1 Stroke History: 0 Vascular Disease History: 1 Age Score: 2 Gender Score: 0    HYPERTENSION CONTROL Vitals:   01/06/24 1334  BP: (!) 138/90    The patient's blood pressure is elevated above target today.  In order to address the patient's elevated BP:            Physical  Exam:   VS:  BP (!) 138/90   Pulse 76   Ht 5\' 10"  (1.778 m)   Wt 158 lb (71.7 kg)   SpO2 97%   BMI 22.67 kg/m    Wt Readings from Last 3 Encounters:  01/06/24 158 lb (71.7 kg)  01/06/24 157 lb 12.8 oz (71.6 kg)  12/25/23 158 lb (71.7 kg)     GEN: Well nourished, well developed in no acute distress NECK: No JVD CARDIAC: Normal rate and regular. RESPIRATORY:  Clear to auscultation without rales, wheezing or rhonchi  ABDOMEN: Soft, non-tender, non-distended EXTREMITIES:  No edema; No deformity   ASSESSMENT AND PLAN:   Jacob Farmer is a 78 y.o. male with h/o coronary disease, paroxysmal atrial fibrillation, tachybradycardia syndrome, HFpEF, type 2 diabetes, essential hypertension with bouts of orthostatic hypotension, rheumatoid and osteoarthritis, end-stage renal disease, prostate cancer, GI bleeding and stroke who is being seen today for evaluation for Watchman device implant.   #. Paroxysmal atrial fibrillation #. Secondary hypercoagulable state due to atrial fibrillation #. GI bleeding #. History of CVA #. ESRD on hemodialysis  Patient would likely benefit from Watchman procedure given that he had a stroke while off anticoagulation in the setting of paroxysmal atrial fibrillation.  Unfortunately last stroke was complicated by hemorrhagic conversion.  He has remained off oral anticoagulation since.  On a recent Zio monitor he did have short paroxysms of atrial fibrillation.  For this reason, I think we should proceed with watchman implantation.  Timing of Watchman is difficult given that he also needs AV fistula.  I think it would be best to have AV fistula done before watchman implantation so as to reduce the infectious risk associated with an indwelling intravenous dialysis catheter.  Also, post watchman implantation he will need uninterrupted anticoagulation for at least 45 days.  In the interim, I will discuss with his neurologist Dr. Pearlean Brownie if it would be safe to resume Eliquis 2.5  mg twice daily to offer some degree of protection for stroke prophylaxis.  We would then continue this up until watchman implant and for 45 days after.  Follow up in 3 months.   Signed, Nobie Putnam, MD

## 2024-01-06 NOTE — Telephone Encounter (Signed)
Received fax from Dr Wolfgang Phoenix requesting permanent access.  Will give to [facility] and scan fax into media.

## 2024-01-06 NOTE — Patient Instructions (Addendum)
I had a long d/w patient and his wife about his recent embolic stroke, thrombolysis with TNK and hemorrhagic transformation, chronic atrial fibrillation and history of GI bleed, risk for recurrent stroke/TIAs, personally independently reviewed imaging studies and stroke evaluation results and answered questions.Continue aspirin 81 mg daily  for secondary stroke prevention due to history of GI bleed on anticoagulation in the past and maintain strict control of hypertension with blood pressure goal below 130/90, diabetes with hemoglobin A1c goal below 6.5% and lipids with LDL cholesterol goal below 70 mg/dL. I also advised the patient to eat a healthy diet with plenty of whole grains, cereals, fruits and vegetables, exercise regularly and maintain ideal body weight .I would strongly encourage him to consider the Watchman device due to his prior history of GI bleed is not a good long-term anticoagulation candidate.  He was advised to keep his appointment with Dr. Nobie Putnam today to discuss this further.  Patient was also neurologically cleared to undergo AV fistula surgery for his dialysis.  Followup in the future with my nurse practitioner in 6 months or call earlier if necessary.  Stroke Prevention Some medical conditions and behaviors can lead to a higher chance of having a stroke. You can help prevent a stroke by eating healthy, exercising, not smoking, and managing any medical conditions you have. Stroke is a leading cause of functional impairment. Primary prevention is particularly important because a majority of strokes are first-time events. Stroke changes the lives of not only those who experience a stroke but also their family and other caregivers. How can this condition affect me? A stroke is a medical emergency and should be treated right away. A stroke can lead to brain damage and can sometimes be life-threatening. If a person gets medical treatment right away, there is a better chance of  surviving and recovering from a stroke. What can increase my risk? The following medical conditions may increase your risk of a stroke: Cardiovascular disease. High blood pressure (hypertension). Diabetes. High cholesterol. Sickle cell disease. Blood clotting disorders (hypercoagulable state). Obesity. Sleep disorders (obstructive sleep apnea). Other risk factors include: Being older than age 55. Having a history of blood clots, stroke, or mini-stroke (transient ischemic attack, TIA). Genetic factors, such as race, ethnicity, or a family history of stroke. Smoking cigarettes or using other tobacco products. Taking birth control pills, especially if you also use tobacco. Heavy use of alcohol or drugs, especially cocaine and methamphetamine. Physical inactivity. What actions can I take to prevent this? Manage your health conditions High cholesterol levels. Eating a healthy diet is important for preventing high cholesterol. If cholesterol cannot be managed through diet alone, you may need to take medicines. Take any prescribed medicines to control your cholesterol as told by your health care provider. Hypertension. To reduce your risk of stroke, try to keep your blood pressure below 130/80. Eating a healthy diet and exercising regularly are important for controlling blood pressure. If these steps are not enough to manage your blood pressure, you may need to take medicines. Take any prescribed medicines to control hypertension as told by your health care provider. Ask your health care provider if you should monitor your blood pressure at home. Have your blood pressure checked every year, even if your blood pressure is normal. Blood pressure increases with age and some medical conditions. Diabetes. Eating a healthy diet and exercising regularly are important parts of managing your blood sugar (glucose). If your blood sugar cannot be managed through diet and exercise, you  may need to take  medicines. Take any prescribed medicines to control your diabetes as told by your health care provider. Get evaluated for obstructive sleep apnea. Talk to your health care provider about getting a sleep evaluation if you snore a lot or have excessive sleepiness. Make sure that any other medical conditions you have, such as atrial fibrillation or atherosclerosis, are managed. Nutrition Follow instructions from your health care provider about what to eat or drink to help manage your health condition. These instructions may include: Reducing your daily calorie intake. Limiting how much salt (sodium) you use to 1,500 milligrams (mg) each day. Using only healthy fats for cooking, such as olive oil, canola oil, or sunflower oil. Eating healthy foods. You can do this by: Choosing foods that are high in fiber, such as whole grains, and fresh fruits and vegetables. Eating at least 5 servings of fruits and vegetables a day. Try to fill one-half of your plate with fruits and vegetables at each meal. Choosing lean protein foods, such as lean cuts of meat, poultry without skin, fish, tofu, beans, and nuts. Eating low-fat dairy products. Avoiding foods that are high in sodium. This can help lower blood pressure. Avoiding foods that have saturated fat, trans fat, and cholesterol. This can help prevent high cholesterol. Avoiding processed and prepared foods. Counting your daily carbohydrate intake.  Lifestyle If you drink alcohol: Limit how much you have to: 0-1 drink a day for women who are not pregnant. 0-2 drinks a day for men. Know how much alcohol is in your drink. In the [place] one drink equals one 12 oz bottle of beer ( ), one 5 oz glass of wine ( ), or one 1 oz glass of hard liquor (44mL). Do not use any products that contain nicotine or tobacco. These products include cigarettes, chewing tobacco, and vaping devices, such as e-cigarettes. If you need help quitting, ask your health care  provider. Avoid secondhand smoke. Do not use drugs. Activity  Try to stay at a healthy weight. Get at least 30 minutes of exercise on most days, such as: Fast walking. Biking. Swimming. Medicines Take over-the-counter and prescription medicines only as told by your health care provider. Aspirin or blood thinners (antiplatelets or anticoagulants) may be recommended to reduce your risk of forming blood clots that can lead to stroke. Avoid taking birth control pills. Talk to your health care provider about the risks of taking birth control pills if: You are over 54 years old. You smoke. You get very bad headaches. You have had a blood clot. Where to find more information American Stroke Association: www.strokeassociation.org Get help right away if: You or a loved one has any symptoms of a stroke. "BE FAST" is an easy way to remember the main warning signs of a stroke: B - Balance. Signs are dizziness, sudden trouble walking, or loss of balance. E - Eyes. Signs are trouble seeing or a sudden change in vision. F - Face. Signs are sudden weakness or numbness of the face, or the face or eyelid drooping on one side. A - Arms. Signs are weakness or numbness in an arm. This happens suddenly and usually on one side of the body. S - Speech. Signs are sudden trouble speaking, slurred speech, or trouble understanding what people say. T - Time. Time to call emergency services. Write down what time symptoms started. You or a loved one has other signs of a stroke, such as: A sudden, severe headache with no known cause. Nausea or vomiting.  Seizure. These symptoms may represent a serious problem that is an emergency. Do not wait to see if the symptoms will go away. Get medical help right away. Call your local emergency services (911 in the [place] Do not drive yourself to the hospital. Summary You can help to prevent a stroke by eating healthy, exercising, not smoking, limiting alcohol intake, and  managing any medical conditions you may have. Do not use any products that contain nicotine or tobacco. These include cigarettes, chewing tobacco, and vaping devices, such as e-cigarettes. If you need help quitting, ask your health care provider. Remember "BE FAST" for warning signs of a stroke. Get help right away if you or a loved one has any of these signs. This information is not intended to replace advice given to you by your health care provider. Make sure you discuss any questions you have with your health care provider. Document Revised: 11/04/2022 Document Reviewed: 11/04/2022 Elsevier Patient Education  2024 [employer].

## 2024-01-07 ENCOUNTER — Telehealth: Payer: Self-pay

## 2024-01-07 DIAGNOSIS — Z992 Dependence on renal dialysis: Secondary | ICD-10-CM | POA: Diagnosis not present

## 2024-01-07 DIAGNOSIS — N186 End stage renal disease: Secondary | ICD-10-CM | POA: Diagnosis not present

## 2024-01-07 DIAGNOSIS — N2581 Secondary hyperparathyroidism of renal origin: Secondary | ICD-10-CM | POA: Diagnosis not present

## 2024-01-07 NOTE — Telephone Encounter (Signed)
Copied from CRM [id number]. Topic: Clinical - Medical Advice >> Jan 07, 2024  2:29 PM Dennison Nancy wrote: Reason for CRM: Tiffany wise nurse practitioner, for patient  , patient is  having issues with sleeping  Patient is on dialysis Monday Wednesday and Friday for 4 hours  Tiffany try putting patient on Trazadone and its not working  Can call tiffany [phone] work for Dr. Wolfgang Phoenix office

## 2024-01-08 ENCOUNTER — Ambulatory Visit: Payer: Medicare HMO | Admitting: Dermatology

## 2024-01-08 ENCOUNTER — Encounter: Payer: Self-pay | Admitting: Dermatology

## 2024-01-08 DIAGNOSIS — Z79899 Other long term (current) drug therapy: Secondary | ICD-10-CM | POA: Diagnosis not present

## 2024-01-08 DIAGNOSIS — L2089 Other atopic dermatitis: Secondary | ICD-10-CM | POA: Diagnosis not present

## 2024-01-08 MED ORDER — DUPIXENT 300 MG/2ML ~~LOC~~ SOAJ: 300.0000 mg | SUBCUTANEOUS | 3 refills | Status: DC

## 2024-01-08 NOTE — Patient Instructions (Signed)

## 2024-01-08 NOTE — Telephone Encounter (Signed)
Pt informed

## 2024-01-08 NOTE — Progress Notes (Signed)
   Follow-Up Visit   Subjective  Jacob Farmer is a 78 y.o. male who presents for the following: Atopic Dermatitis 6 month follow up, not flared today, hx of flares at trunk and extremities  Currently on Dupixent and staying clear in affected areas.    The following portions of the chart were reviewed this encounter and updated as appropriate: medications, allergies, medical history  Review of Systems:  No other skin or systemic complaints except as noted in HPI or Assessment and Plan.  Objective  Well appearing patient in no apparent distress; mood and affect are within normal limits.  Areas Examined:   Relevant physical exam findings are noted in the Assessment and Plan.    Assessment & Plan   OTHER ATOPIC DERMATITIS   Related Medications Dupilumab (DUPIXENT) 300 MG/2ML SOAJ Inject 300 mg into the skin every 14 (fourteen) days. Starting at day 15 for maintenance. 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 Well controlled on dupixent with no side effects   Atopic dermatitis (eczema) is a chronic, relapsing, pruritic condition that can significantly affect quality of life. It is often associated with allergic rhinitis and/or asthma and can require treatment with topical medications, phototherapy, or in severe cases biologic injectable medication (Dupixent; Adbry) or Oral JAK inhibitors.   Treatment Plan: Continue Dupixent injection 300 mg every 2 weeks Continue otc Cerave cream daily    Dupilumab (Dupixent) is a treatment given by injection for adults and children with moderate-to-severe atopic dermatitis. Goal is control of skin condition, not cure. It is given as 2 injections at the first dose followed by 1 injection ever 2 weeks thereafter.  Young children are dosed monthly.   Potential side  effects include allergic reaction, herpes infections, injection site reactions and conjunctivitis (inflammation of the eyes).  The use of Dupixent requires long term medication management, including periodic office visits.    Recommend gentle skin care.      Return in about 1 year (around 01/07/2025) for atopic derm followup .  I, Asher Muir, CMA, am acting as scribe for Elie Goody, MD.   Documentation: I have reviewed the above documentation for accuracy and completeness, and I agree with the above.  Elie Goody, MD

## 2024-01-09 ENCOUNTER — Telehealth: Payer: Self-pay

## 2024-01-09 DIAGNOSIS — Z992 Dependence on renal dialysis: Secondary | ICD-10-CM | POA: Diagnosis not present

## 2024-01-09 DIAGNOSIS — N2581 Secondary hyperparathyroidism of renal origin: Secondary | ICD-10-CM | POA: Diagnosis not present

## 2024-01-09 DIAGNOSIS — N186 End stage renal disease: Secondary | ICD-10-CM | POA: Diagnosis not present

## 2024-01-09 MED ORDER — APIXABAN 2.5 MG PO TABS: 2.5000 mg | ORAL_TABLET | Freq: Two times a day (BID) | ORAL | 11 refills | Status: DC

## 2024-01-09 NOTE — Telephone Encounter (Signed)
Patient's wife reports that the patient is currently taking a baby aspirin daily and would like to know if this should be discontinued

## 2024-01-09 NOTE — Telephone Encounter (Signed)
-----   Message from Nobie Putnam sent at 01/09/2024  6:31 AM EST ----- Regarding: FW: Watchman eval Let's start this patient on Eliquis 2.5mg  twice daily while we await Watchman implant. ----- Message ----- From: Micki Riley, MD Sent: 01/08/2024   4:25 PM EST To: Nobie Putnam, MD Subject: RE: Watchman eval                              Agree. Even for ICH we usually recommend starting AC back in 4 weeks ----- Message ----- From: Nobie Putnam, MD Sent: 01/07/2024   2:39 PM EST To: Micki Riley, MD Subject: Watchman eval                                  Dr. Pearlean Brownie,  I recently saw this patient of yours for Watchman eval. he has a history of stroke off anticoagulation for GI bleeding.  Unfortunately, he had hemorrhagic conversion with last stroke and has been off anticoagulation since.  I am hoping to implant a Watchman, but we have to wait on him to have AV fistula surgery first.  With his history of intracerebral bleed, can I start Eliquis 2.5 mg twice daily in the interim until watchman can be implanted?  Any advice is appreciated.  Thanks, Nobie Putnam, MD Cardiac Electrophysiology

## 2024-01-09 NOTE — Telephone Encounter (Signed)
Spoke with the patient's wife and advised on recommendations from Dr. Jimmey Ralph. Prescription has been sent in.

## 2024-01-09 NOTE — Telephone Encounter (Signed)
Patient's wife is calling back to ask another question about patient's medication.

## 2024-01-11 NOTE — Progress Notes (Signed)
History of Present Illness:   4.23.2024: referred by Dr Allena Katz for PSA elevation.  PSA in Oct 2022--0.69 PSA 2.16.2024--7.49.  He does have a h/o PCA--  He underwent ultrasound and biopsy of his prostate on 9.27.2016. Prostatic volume 17.6 cc. PSA 4.7. PSA density 0.27.  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   He underwent brachytherapy on 1.26.2017.   Last seen in 2020 for PCa  followup. PSA @ that time 0.20.  He has stage IV CKD.  Followed by Dr. Wolfgang Phoenix.  7.12.2024: PSMA PET scan-- 1. Radiotracer avid left external iliac, left periaortic, and left pelvic lymph nodes compatible with nodal disease involvement. 2. No evidence of local recurrence in the prostate bed. 3. No evidence of radiotracer avid metastatic disease in the chest. 4. Trace bilateral pleural effusions. 5. 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 Recommend follow-up ultrasound every 12 months and vascular consultation. 6.  Aortic Atherosclerosis (ICD10-I70.0).  9.10.2024: Started on LT ADT w/ leuprolide 30 mg.  11.5.2024: PSA down to 1.7 from 26 which was last checked in April of this year.  Testosterone level 7.  Occasional hot flashes.  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   1.28.2025: PSA 0.1, T level <3.  No real voiding issues except for nocturia x 3-4.  No blood in his urine.  He does not get regular hot flashes.  He is complaining of a sore area on his right buttock that drained this morning.  Past Medical History:  Diagnosis Date   Acute CVA (cerebrovascular accident) (HCC) 09/11/2023   CAD (coronary artery disease)    Acute myocardial infarction treated with TPA in 1990; 1999-stents to circumflex and RCA; residual total occlusion of the left anterior descending; normal ejection fraction; stress nuclear in 2001-distal anteroseptal ischemia; normal LV function   CKD (chronic kidney disease) stage 4,  GFR 15-29 ml/min (HCC)    First degree heart block    History of gastric ulcer    History of kidney stones    History of MI (myocardial infarction)    1990-  treated w/ TPA   Hyperlipidemia    Hypertension    Jaundice    OA (osteoarthritis)    Prediabetes    Prostate cancer (HCC)    Stage T1c , Gleason 3+4,  PSA 4.7,  vol 24cc--  scheduled for radiative seed implants   Psoriatic arthritis (HCC)    RA (rheumatoid arthritis) (HCC)    Dr. Beatrix Fetters   Sinus bradycardia    Wears dentures     Past Surgical History:  Procedure Laterality Date   BIOPSY  08/27/2019   Procedure: BIOPSY;  Surgeon: Malissa Hippo, MD;  [facility];  Service: Endoscopy;;  gastric   BIOPSY  04/03/2022   Procedure: BIOPSY;  Surgeon: Dolores Frame, MD;  [facility]: [facility];  Service: Gastroenterology;;   BIOPSY  04/21/2023   Procedure: BIOPSY;  Surgeon: Lanelle Bal, DO;  Location: [facility];  Service: Endoscopy;;   callus removal Left 09/13/2022   left foot   CARDIOVASCULAR STRESS TEST  2001  per Dr Dietrich Pates clinic note   distal anteroseptal ischemia,  normal LVF   COLONOSCOPY  last one 2006 (approx)   COLONOSCOPY WITH PROPOFOL N/A 04/03/2022   Procedure: COLONOSCOPY WITH PROPOFOL;  Surgeon: Dolores Frame, MD;  Location: [facility];  Service: Gastroenterology;  Laterality: N/A;  945  CORONARY ANGIOPLASTY WITH STENT PLACEMENT  1999   DES x2  to CFX and RCA/  residual total occlusion LAD,  normal LVEF   ENTEROSCOPY N/A 04/24/2023   Procedure: ENTEROSCOPY;  Surgeon: Dolores Frame, MD;  [facility]: [facility];  Service: Gastroenterology;  Laterality: N/A;   ESOPHAGOGASTRODUODENOSCOPY (EGD) WITH PROPOFOL N/A 08/27/2019   Procedure: ESOPHAGOGASTRODUODENOSCOPY (EGD) WITH PROPOFOL;  Surgeon: Malissa Hippo, MD;  [facility]: [facility];  Service: Endoscopy;  Laterality: N/A;  10:10   ESOPHAGOGASTRODUODENOSCOPY (EGD) WITH PROPOFOL N/A 04/03/2022    Procedure: ESOPHAGOGASTRODUODENOSCOPY (EGD) WITH PROPOFOL;  Surgeon: Dolores Frame, MD;  [facility]: [facility];  Service: Gastroenterology;  Laterality: N/A;   ESOPHAGOGASTRODUODENOSCOPY (EGD) WITH PROPOFOL N/A 02/16/2023   Procedure: ESOPHAGOGASTRODUODENOSCOPY (EGD) WITH PROPOFOL;  Surgeon: Dolores Frame, MD;  [facility];  Service: Gastroenterology;  Laterality: N/A;   ESOPHAGOGASTRODUODENOSCOPY (EGD) WITH PROPOFOL N/A 04/21/2023   Procedure: ESOPHAGOGASTRODUODENOSCOPY (EGD) WITH PROPOFOL;  Surgeon: Lanelle Bal, DO;  [facility]: [facility];  Service: Endoscopy;  Laterality: N/A;   EYE SURGERY Bilateral    cataract   GIVENS CAPSULE STUDY N/A 04/22/2023   Procedure: GIVENS CAPSULE STUDY;  Surgeon: Dolores Frame, MD;  [facility]: [facility];  Service: Gastroenterology;  Laterality: N/A;   HEMOSTASIS CLIP PLACEMENT  04/24/2023   Procedure: HEMOSTASIS CLIP PLACEMENT;  Surgeon: Dolores Frame, MD;  [facility];  Service: Gastroenterology;;   HOT HEMOSTASIS  04/03/2022   Procedure: HOT HEMOSTASIS (ARGON PLASMA COAGULATION/BICAP);  Surgeon: Marguerita Merles, Reuel Boom, MD;  [facility]: [facility];  Service: Gastroenterology;;   HOT HEMOSTASIS  04/24/2023   Procedure: HOT HEMOSTASIS (ARGON PLASMA COAGULATION/BICAP);  Surgeon: Marguerita Merles, Reuel Boom, MD;  [facility]: [facility];  Service: Gastroenterology;;   IR FLUORO GUIDE CV LINE RIGHT  05/30/2023   IR US GUIDE VASC ACCESS RIGHT  05/30/2023   POLYPECTOMY  04/03/2022   Procedure: POLYPECTOMY;  Surgeon: Dolores Frame, MD;  [facility];  Service: Gastroenterology;;   POSTERIOR CERVICAL FUSION/FORAMINOTOMY N/A 05/26/2017   Procedure: RIGHT C4-5 FORAMINOTOMY WITH EXCISION OF HERNIATED DISC;  Surgeon: Kerrin Champagne, MD;  Location: [facility];  Service: Orthopedics;  Laterality: N/A;   RADIOACTIVE SEED IMPLANT N/A 01/11/2016   Procedure: RADIOACTIVE SEED  IMPLANT/BRACHYTHERAPY IMPLANT;  Surgeon: Marcine Matar, MD;  Location: [facility];  Service: Urology;  Laterality: N/A;   73  seeds implanted no seeds founds in bladder   SCLEROTHERAPY  04/24/2023   Procedure: SCLEROTHERAPY;  Surgeon: Marguerita Merles, Reuel Boom, MD;  [facility];  Service: Gastroenterology;;   SUBMUCOSAL TATTOO INJECTION  04/24/2023   Procedure: SUBMUCOSAL TATTOO INJECTION;  Surgeon: Dolores Frame, MD;  [facility];  Service: Gastroenterology;;    Home Medications:  Allergies as of 01/13/2024   No Known Allergies      Medication List        Accurate as of January 11, 2024 10:47 AM. If you have any questions, ask your nurse or doctor.          apixaban 2.5 MG Tabs tablet Commonly known as: ELIQUIS Take 1 tablet (2.5 mg total) by mouth 2 (two) times daily.   calcitRIOL 0.25 MCG capsule Commonly known as: ROCALTROL Take 0.25 mcg by mouth every Monday, Wednesday, and Friday.   Dupixent 300 MG/2ML Soaj Generic drug: Dupilumab Inject 300 mg into the skin every 14 (fourteen) days. Starting at day 15 for maintenance.   ELIGARD Curtice Inject  1 Dose into the skin every 4 (four) months.   folic acid 400 MCG tablet Commonly known as: FOLVITE Take 400 mcg by mouth daily.   leflunomide 10 MG tablet Commonly known as: ARAVA Take 1 tablet (10 mg total) by mouth daily.   pantoprazole 40 MG tablet Commonly known as: PROTONIX TAKE 1 TABLET TWICE DAILY   Renvela 800 MG tablet Generic drug: sevelamer carbonate Take 800 mg by mouth 2 (two) times daily with a meal.   rosuvastatin 40 MG tablet Commonly known as: CRESTOR Take 1 tablet (40 mg total) by mouth daily.   triamcinolone cream 0.1 % Commonly known as: KENALOG Apply 1 Application topically daily. What changed:  when to take this reasons to take this        Allergies: No Known Allergies  Family History  Problem Relation Age of Onset   Heart attack  Mother    Coronary artery disease Father    Ulcerative colitis Father    Ulcers Father    Breast cancer Sister    COPD Brother    Pancreatic cancer Brother    Healthy Sister    Diabetes Brother    Cirrhosis Son    Seizures Son     Social History:  reports that he quit smoking about 34 years ago. His smoking use included cigarettes. He started smoking about 64 years ago. He has a 30 pack-year smoking history. He has never been exposed to tobacco smoke. He has never used smokeless tobacco. He reports current alcohol use of about 3.0 standard drinks of alcohol per week. He reports that he does not use drugs.  ROS: A complete review of systems was performed.  All systems are negative except for pertinent findings as noted.  Physical Exam:  Vital signs in last 24 hours: There were no vitals taken for this visit. Constitutional:  Alert and oriented, No acute distress Cardiovascular: Regular rate  Respiratory: Normal respiratory effort GU: Indurated 2 cm area on his right buttock with small draining sinus, most likely abscess.  Minimal fluctuance. 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  Now on LT ADT which he tolerates well.  Excellent PSA response thus far.    Right buttock abscess  Plan:  5-month leuprolide administered today  I gave him a weeks worth of doxycycline  I would recommend follow-up of aneurysmal dilatation of abdominal aorta with the patient's vascular surgeon

## 2024-01-12 ENCOUNTER — Telehealth: Payer: Self-pay | Admitting: Cardiology

## 2024-01-12 ENCOUNTER — Other Ambulatory Visit: Payer: Self-pay

## 2024-01-12 DIAGNOSIS — Z992 Dependence on renal dialysis: Secondary | ICD-10-CM | POA: Diagnosis not present

## 2024-01-12 DIAGNOSIS — N186 End stage renal disease: Secondary | ICD-10-CM | POA: Diagnosis not present

## 2024-01-12 DIAGNOSIS — N2581 Secondary hyperparathyroidism of renal origin: Secondary | ICD-10-CM | POA: Diagnosis not present

## 2024-01-12 NOTE — Telephone Encounter (Signed)
Left message for patient's wife to call back.

## 2024-01-12 NOTE — Telephone Encounter (Signed)
Spoke with the patient's wife and she is aware that Dr. Jimmey Ralph recommends that he continue on ASA daily.

## 2024-01-12 NOTE — Telephone Encounter (Signed)
Follow Up:     Patient was returning Carlyle's call from today.

## 2024-01-12 NOTE — Telephone Encounter (Signed)
See previous phone note.

## 2024-01-13 ENCOUNTER — Ambulatory Visit (INDEPENDENT_AMBULATORY_CARE_PROVIDER_SITE_OTHER): Payer: Medicare HMO | Admitting: Urology

## 2024-01-13 ENCOUNTER — Encounter: Payer: Self-pay | Admitting: Urology

## 2024-01-13 VITALS — BP 148/71 | HR 80

## 2024-01-13 DIAGNOSIS — N151 Renal and perinephric abscess: Secondary | ICD-10-CM

## 2024-01-13 DIAGNOSIS — R9721 Rising PSA following treatment for malignant neoplasm of prostate: Secondary | ICD-10-CM

## 2024-01-13 DIAGNOSIS — Z79899 Other long term (current) drug therapy: Secondary | ICD-10-CM

## 2024-01-13 DIAGNOSIS — C61 Malignant neoplasm of prostate: Secondary | ICD-10-CM

## 2024-01-13 LAB — URINALYSIS, ROUTINE W REFLEX MICROSCOPIC
Bilirubin, UA: NEGATIVE
Ketones, UA: NEGATIVE
Nitrite, UA: NEGATIVE
Specific Gravity, UA: 1.015 (ref 1.005–1.030)
Urobilinogen, Ur: 0.2 mg/dL (ref 0.2–1.0)
pH, UA: 8 — ABNORMAL HIGH (ref 5.0–7.5)

## 2024-01-13 LAB — MICROSCOPIC EXAMINATION

## 2024-01-13 MED ORDER — DOXYCYCLINE HYCLATE 100 MG PO TABS: 100.0000 mg | ORAL_TABLET | Freq: Two times a day (BID) | ORAL | 0 refills | Status: DC

## 2024-01-14 DIAGNOSIS — N186 End stage renal disease: Secondary | ICD-10-CM | POA: Diagnosis not present

## 2024-01-14 DIAGNOSIS — Z992 Dependence on renal dialysis: Secondary | ICD-10-CM | POA: Diagnosis not present

## 2024-01-14 DIAGNOSIS — N2581 Secondary hyperparathyroidism of renal origin: Secondary | ICD-10-CM | POA: Diagnosis not present

## 2024-01-16 ENCOUNTER — Ambulatory Visit: Payer: Medicare HMO

## 2024-01-16 DIAGNOSIS — N2581 Secondary hyperparathyroidism of renal origin: Secondary | ICD-10-CM | POA: Diagnosis not present

## 2024-01-16 DIAGNOSIS — N186 End stage renal disease: Secondary | ICD-10-CM | POA: Diagnosis not present

## 2024-01-16 DIAGNOSIS — Z992 Dependence on renal dialysis: Secondary | ICD-10-CM | POA: Diagnosis not present

## 2024-01-17 DIAGNOSIS — Z992 Dependence on renal dialysis: Secondary | ICD-10-CM | POA: Diagnosis not present

## 2024-01-17 DIAGNOSIS — N186 End stage renal disease: Secondary | ICD-10-CM | POA: Diagnosis not present

## 2024-01-19 ENCOUNTER — Telehealth: Payer: Self-pay

## 2024-01-19 DIAGNOSIS — Z992 Dependence on renal dialysis: Secondary | ICD-10-CM | POA: Diagnosis not present

## 2024-01-19 DIAGNOSIS — N2581 Secondary hyperparathyroidism of renal origin: Secondary | ICD-10-CM | POA: Diagnosis not present

## 2024-01-19 DIAGNOSIS — N186 End stage renal disease: Secondary | ICD-10-CM | POA: Diagnosis not present

## 2024-01-19 NOTE — Telephone Encounter (Signed)
Called patient today to confirmed if eligard 4 months was given at last office visited. Patient's wife denial patient receiving eligard 4 months injection at last office visited. Patient/wife verbalized understanding.

## 2024-01-20 ENCOUNTER — Ambulatory Visit: Payer: Medicare HMO

## 2024-01-20 DIAGNOSIS — C61 Malignant neoplasm of prostate: Secondary | ICD-10-CM | POA: Diagnosis not present

## 2024-01-20 MED ORDER — LEUPROLIDE ACETATE (4 MONTH) 30 MG ~~LOC~~ KIT
30.0000 mg | PACK | Freq: Once | SUBCUTANEOUS | Status: AC
  Administered 2024-01-20: 30 mg via SUBCUTANEOUS

## 2024-01-20 NOTE — Progress Notes (Signed)
 Eligard  SubQ Injection   Due to Prostate Cancer patient is present today for a Eligard  Injection.  Medication: Eligard  4 month Dose: 30 mg  Location: right  Lot: [id number] Exp: [id number]  Patient tolerated well, no complications were noted  Performed by: Carlos, CMA

## 2024-01-21 DIAGNOSIS — Z992 Dependence on renal dialysis: Secondary | ICD-10-CM | POA: Diagnosis not present

## 2024-01-21 DIAGNOSIS — N2581 Secondary hyperparathyroidism of renal origin: Secondary | ICD-10-CM | POA: Diagnosis not present

## 2024-01-21 DIAGNOSIS — N186 End stage renal disease: Secondary | ICD-10-CM | POA: Diagnosis not present

## 2024-01-23 DIAGNOSIS — Z992 Dependence on renal dialysis: Secondary | ICD-10-CM | POA: Diagnosis not present

## 2024-01-23 DIAGNOSIS — N186 End stage renal disease: Secondary | ICD-10-CM | POA: Diagnosis not present

## 2024-01-23 DIAGNOSIS — N2581 Secondary hyperparathyroidism of renal origin: Secondary | ICD-10-CM | POA: Diagnosis not present

## 2024-01-26 ENCOUNTER — Encounter (HOSPITAL_COMMUNITY): Payer: Self-pay | Admitting: Vascular Surgery

## 2024-01-26 ENCOUNTER — Other Ambulatory Visit: Payer: Self-pay

## 2024-01-26 DIAGNOSIS — N186 End stage renal disease: Secondary | ICD-10-CM | POA: Diagnosis not present

## 2024-01-26 DIAGNOSIS — N2581 Secondary hyperparathyroidism of renal origin: Secondary | ICD-10-CM | POA: Diagnosis not present

## 2024-01-26 DIAGNOSIS — Z992 Dependence on renal dialysis: Secondary | ICD-10-CM | POA: Diagnosis not present

## 2024-01-26 NOTE — Anesthesia Preprocedure Evaluation (Addendum)
Anesthesia Evaluation  Patient identified by MRN, date of birth, ID band Patient awake    Reviewed: Allergy & Precautions, H&P , NPO status , Patient's Chart, lab work & pertinent test results  Airway Mallampati: II   Neck ROM: full    Dental   Pulmonary former smoker   breath sounds clear to auscultation       Cardiovascular hypertension, + CAD and + Cardiac Stents  + dysrhythmias Atrial Fibrillation  Rhythm:regular Rate:Normal     Neuro/Psych CVA    GI/Hepatic PUD,,,H/o GI bleeding in setting of anticoagulation.   Endo/Other  diabetes, Type 2    Renal/GU ESRF and DialysisRenal disease     Musculoskeletal  (+) Arthritis ,    Abdominal   Peds  Hematology   Anesthesia Other Findings   Reproductive/Obstetrics                             Anesthesia Physical Anesthesia Plan  ASA: 4  Anesthesia Plan: General   Post-op Pain Management:    Induction: Intravenous  PONV Risk Score and Plan: 2 and Ondansetron, Dexamethasone and Treatment may vary due to age or medical condition  Airway Management Planned: LMA  Additional Equipment:   Intra-op Plan:   Post-operative Plan: Extubation in OR  Informed Consent: I have reviewed the patients History and Physical, chart, labs and discussed the procedure including the risks, benefits and alternatives for the proposed anesthesia with the patient or authorized representative who has indicated his/her understanding and acceptance.     Dental advisory given  Plan Discussed with: CRNA, Anesthesiologist and Surgeon  Anesthesia Plan Comments: (PAT note written 01/26/2024 by Shonna Chock, PA-C.  )       Anesthesia Quick Evaluation

## 2024-01-26 NOTE — Progress Notes (Signed)
 Anesthesia Chart Review: Jacob Farmer  Case: [id number] Date/Time: 01/27/24 1238   Procedure: LEFT ARM ARTERIOVENOUS (AV) FISTULA VERSUS ARTERIOVENOUS GRAFT CREATION (Left)   Anesthesia type: Choice   Pre-op  diagnosis: End stage renal disese   [facility] / [facility]   Surgeons: Adine Hoof, MD       DISCUSSION: Patient is a 78 year old male scheduled for the above procedure.  History includes former smoker, CAD (MI, tPA 1990; s/p stents in 1999 to LCx and RCA with CTO of LAD), PAF, tachybrady syndrome, hyperlipidemia, HTN (with orthostatic hypotension), RA, psoriatic arthritis, prediabetes, ESRD, CVA (08/2023 in setting of PAF off Eliquis  for GI bleed, s/p TNK but developed hemorrhagic conversion with ICH), GI bleed (04/2019, s/p PRBC; s/p clips & APC for small bowel AVMs and duodenal ulcer; s/p 4 units PRBC 06/2023 & Eliquis  held), prostate cancer (radioactive seed implant 01/11/16), spinal surgery (right C4 foraminotomies 05/26/17)  Last EP visit with Dr. Daneil Dunker was on 01/06/24 for evaluation of Watchman Device. He remained off anticoagulation (GI bleed, CVA with hemorrhagic conversion). Recent Zio monitor did show short runs of afib/flutter. Proceeding with Watchman device recommended; However, he recommended that Jacob Farmer undergo AVF creation first to reduce infectious risk associated with indwelling TDC. Also because he would need uninterrupted anticoagulation for 45 days following Watchman device. He planned to discuss with neurologist Dr. Janett Medin if safe to resume Eliquis . 3 month follow-up planned.  No recent anginal symptoms at primary cardiology follow-up on 12/25/23 with Finis Hugger, [place], PA-C.  Last neurology follow-up with Dr. Janett Medin was on 01/06/24. Patient's left sided weakness had improved but still with some manage fine motor skills.  Tolerating aspirin  with minor bruising but no bleeding.  No recurrent stroke or TIA symptoms.  Encouraged him to consider Watchman  device given he was not a good long-term anticoagulation candidate.  He also added, "Patient was also neurologically cleared to undergo AV fistula surgery for his dialysis. Followup in the future with my nurse practitioner in 6 months or call earlier if necessary. "  He was started on doxycyline 01/13/24 by Dr. Joie Narrow because of a an "Indurated 2 cm area on his right buttock with small draining sinus, most likely abscess. Minimal fluctuance." Wife reportedly thought the wound was improving following antibiotic completion, but could not provide the most up-to-date description since he is at HD until 01/26/24 afternoon. PAT RN sent FYI communication to [facility] RN staff regarding this.   Last dose Eliquis  reported as 01/23/24.  Anesthesia team to evaluate on the day of surgery.     VS:  Wt Readings from Last 3 Encounters:  01/06/24 71.7 kg  01/06/24 71.6 kg  12/25/23 71.7 kg   BP Readings from Last 3 Encounters:  01/13/24 (!) 148/71  01/06/24 (!) 138/90  01/06/24 (!) 174/93   Pulse Readings from Last 3 Encounters:  01/13/24 80  01/06/24 76  01/06/24 75     PROVIDERS: Meldon Sport, MD is PCP Armida Lander, MD is primary cardiologist Paker, Melodie Spry, MD is EP cardiologist Nicholas Bari, MD is rheumatologist Trent Frizzle, MD is neurologist Woodie Hazard, MD is nephrologist Samantha Cress, MD is GI   LABS: For day of surgery. As of 12/04/23, H/H 11.6/26.8.   IMAGES: CTA Head/Neck 09/11/23: IMPRESSION: 1. Negative CTA for large vessel occlusion or other emergent finding. 2. Atheromatous plaque about the left carotid bulb/proximal left ICA with associated stenosis of up to approximate 50% by NASCET criteria. 3. Severe atheromatous stenosis at  the origin of the right vertebral artery. 4. Severe atheromatous stenosis involving the mid-distal left V4 segment. 5. Atheromatous change about the carotid siphons with associated moderate to severe stenoses at the  para clinoid segments bilaterally. 6. Severe atheromatous change about the aortic arch an proximal descending intrathoracic aorta with associated multifocal intimal irregularity and penetrating plaques.    EKG: 09/11/23: Sinus rhythm Prolonged PR interval Consider right atrial enlargement RBBB and LAFB Baseline wander in lead(s) V3 Confirmed by Abner Hoffman [phone]) on 09/11/2023 11:05:34 PM   CV: CT Cardiac (pre Watchman) 12/18/23: IMPRESSION: 1. Elevated calcium  score 3 vessel 1740 which is 82 nd percentile for age/sex 2. Large anterior chicken wing appendage with average diameter 19.1 mm However maximum diameter 23.9 mm suggesting a 27 mm FLX device may be suitable Depth adequate 25.8 mm 3.  Normal PV anatomy see measurements above 4.  Moderate bi atrial enlargement 5.  Normal ascending thoracic aorta 3.3 cm 6.  No pericardial effusion 7.  No ASD/PFO   Zio Monitor 11/29/23 - 12/03/23:  HR 33 - 146, average 72 bpm. 2 nonsustained VT (longest 6 beats) Atrial flutter and/or coarse atrial fibrillation detected. Longest episode was 11 minutes and 23 seconds. Occasional supraventricular ectopy, 2.5%. Rare ventricular ectopy. Nocturnal Mobitz 1 AV block was seen. No symptom trigger episodes.   Echo 09/12/23: 1. Left ventricular ejection fraction, by estimation, is 60 to 65%. The  left ventricle has normal function. The left ventricle demonstrates  regional wall motion abnormalities (see scoring diagram/findings for  description). Left ventricular diastolic  parameters are consistent with Grade I diastolic dysfunction (impaired  relaxation).   2. Right ventricular systolic function is normal. The right ventricular  size is normal. There is normal pulmonary artery systolic pressure.   3. The mitral valve is normal in structure. No evidence of mitral valve  regurgitation. No evidence of mitral stenosis.   4. The aortic valve is tricuspid. Aortic valve regurgitation is not   visualized. No aortic stenosis is present.   5. Aortic dilatation noted. There is mild dilatation of the aortic root,  measuring 41 mm.   6. The inferior vena cava is normal in size with greater than 50%  respiratory variability, suggesting right atrial pressure of 3 mmHg.       Past Medical History:  Diagnosis Date   Acute CVA (cerebrovascular accident) (HCC) 09/11/2023   CAD (coronary artery disease)    Acute myocardial infarction treated with TPA in 1990; 1999-stents to circumflex and RCA; residual total occlusion of the left anterior descending; normal ejection fraction; stress nuclear in 2001-distal anteroseptal ischemia; normal LV function   CKD (chronic kidney disease) stage 4, GFR 15-29 ml/min (HCC)    First degree heart block    History of gastric ulcer    History of kidney stones    History of MI (myocardial infarction)    1990-  treated w/ TPA   Hyperlipidemia    Hypertension    Jaundice    OA (osteoarthritis)    Prediabetes    Prostate cancer (HCC)    Stage T1c , Gleason 3+4,  PSA 4.7,  vol 24cc--  scheduled for radiative seed implants   Psoriatic arthritis (HCC)    RA (rheumatoid arthritis) (HCC)    Dr. Myla Artist   Sinus bradycardia    Wears dentures     Past Surgical History:  Procedure Laterality Date   BIOPSY  08/27/2019   Procedure: BIOPSY;  Surgeon: Ruby Corporal, MD;  [facility];  Service: Endoscopy;;  gastric   BIOPSY  04/03/2022   Procedure: BIOPSY;  Surgeon: Urban Garden, MD;  [facility];  Service: Gastroenterology;;   BIOPSY  04/21/2023   Procedure: BIOPSY;  Surgeon: Vinetta Greening, DO;  [facility];  Service: Endoscopy;;   callus removal Left 09/13/2022   left foot   CARDIOVASCULAR STRESS TEST  2001  per Dr Macdonald [place] clinic note   distal anteroseptal ischemia,  normal LVF   COLONOSCOPY  last one 2006 (approx)   COLONOSCOPY WITH PROPOFOL  N/A 04/03/2022   Procedure: COLONOSCOPY WITH PROPOFOL ;   Surgeon: Urban Garden, MD;  [facility];  Service: Gastroenterology;  Laterality: N/A;  945   CORONARY ANGIOPLASTY WITH STENT PLACEMENT  1999   DES x2  to CFX and RCA/  residual total occlusion LAD,  normal LVEF   ENTEROSCOPY N/A 04/24/2023   Procedure: ENTEROSCOPY;  Surgeon: Urban Garden, MD;  [facility];  Service: Gastroenterology;  Laterality: N/A;   ESOPHAGOGASTRODUODENOSCOPY (EGD) WITH PROPOFOL  N/A 08/27/2019   Procedure: ESOPHAGOGASTRODUODENOSCOPY (EGD) WITH PROPOFOL ;  Surgeon: Ruby Corporal, MD;  [facility];  Service: Endoscopy;  Laterality: N/A;  10:10   ESOPHAGOGASTRODUODENOSCOPY (EGD) WITH PROPOFOL  N/A 04/03/2022   Procedure: ESOPHAGOGASTRODUODENOSCOPY (EGD) WITH PROPOFOL ;  Surgeon: Urban Garden, MD;  [facility];  Service: Gastroenterology;  Laterality: N/A;   ESOPHAGOGASTRODUODENOSCOPY (EGD) WITH PROPOFOL  N/A 02/16/2023   Procedure: ESOPHAGOGASTRODUODENOSCOPY (EGD) WITH PROPOFOL ;  Surgeon: Urban Garden, MD;  [facility];  Service: Gastroenterology;  Laterality: N/A;   ESOPHAGOGASTRODUODENOSCOPY (EGD) WITH PROPOFOL  N/A 04/21/2023   Procedure: ESOPHAGOGASTRODUODENOSCOPY (EGD) WITH PROPOFOL ;  Surgeon: Vinetta Greening, DO;  [facility];  Service: Endoscopy;  Laterality: N/A;   EYE SURGERY Bilateral    cataract   GIVENS CAPSULE STUDY N/A 04/22/2023   Procedure: GIVENS CAPSULE STUDY;  Surgeon: Urban Garden, MD;  [facility];  Service: Gastroenterology;  Laterality: N/A;   HEMOSTASIS CLIP PLACEMENT  04/24/2023   Procedure: HEMOSTASIS CLIP PLACEMENT;  Surgeon: Urban Garden, MD;  [facility];  Service: Gastroenterology;;   HOT HEMOSTASIS  04/03/2022   Procedure: HOT HEMOSTASIS (ARGON PLASMA COAGULATION/BICAP);  Surgeon: Umberto Ganong, Bearl Limes, MD;  [facility];  Service: Gastroenterology;;   HOT HEMOSTASIS   04/24/2023   Procedure: HOT HEMOSTASIS (ARGON PLASMA COAGULATION/BICAP);  Surgeon: Umberto Ganong, Bearl Limes, MD;  [facility];  Service: Gastroenterology;;   IR FLUORO GUIDE CV LINE RIGHT  05/30/2023   IR US  GUIDE VASC ACCESS RIGHT  05/30/2023   POLYPECTOMY  04/03/2022   Procedure: POLYPECTOMY;  Surgeon: Urban Garden, MD;  [facility];  Service: Gastroenterology;;   POSTERIOR CERVICAL FUSION/FORAMINOTOMY N/A 05/26/2017   Procedure: RIGHT C4-5 FORAMINOTOMY WITH EXCISION OF HERNIATED DISC;  Surgeon: Alphonso Jean, MD;  Location: [facility] OR;  Service: Orthopedics;  Laterality: N/A;   RADIOACTIVE SEED IMPLANT N/A 01/11/2016   Procedure: RADIOACTIVE SEED IMPLANT/BRACHYTHERAPY IMPLANT;  Surgeon: Trent Frizzle, MD;  Location: [facility];  Service: Urology;  Laterality: N/A;   73  seeds implanted no seeds founds in bladder   SCLEROTHERAPY  04/24/2023   Procedure: SCLEROTHERAPY;  Surgeon: Umberto Ganong, Bearl Limes, MD;  [facility];  Service: Gastroenterology;;   SUBMUCOSAL TATTOO INJECTION  04/24/2023   Procedure: SUBMUCOSAL TATTOO INJECTION;  Surgeon: Urban Garden, MD;  [facility];  Service: Gastroenterology;;  MEDICATIONS: No current facility-administered medications for this encounter.    allopurinol  (ZYLOPRIM ) 100 MG tablet   amLODipine  (NORVASC ) 5 MG tablet   apixaban  (ELIQUIS ) 2.5 MG TABS tablet   aspirin  EC 81 MG tablet   calcitRIOL  (ROCALTROL ) 0.25 MCG capsule   Dupilumab  (DUPIXENT ) 300 MG/2ML SOAJ   folic acid  (FOLVITE ) 400 MCG tablet   leflunomide  (ARAVA ) 10 MG tablet   Leuprolide  Acetate (ELIGARD  [place])   pantoprazole  (PROTONIX ) 40 MG tablet   RENVELA  800 MG tablet   rosuvastatin  (CRESTOR ) 40 MG tablet   triamcinolone  cream (KENALOG ) 0.1 %   doxycycline  (VIBRA -TABS) 100 MG tablet     Ella Gun, PA-C Surgical Short Stay/Anesthesiology [facility] Phone [phone] [facility] Phone [phone] 01/26/2024 1:27 PM

## 2024-01-26 NOTE — Progress Notes (Signed)
 SDW CALL  Patient's wife was given pre-op  instructions over the phone. The opportunity was given for the patient's wife to ask questions. No further questions asked. Patient's wife verbalized understanding of instructions given.   PCP - Cleola Dach Cardiologist - Dr. Armida Lander Dialysis on MWF.    PPM/ICD - denies Device Orders - n/a Rep Notified - n/a  Chest x-ray - denies EKG - 09/11/23 Stress Test - denies ECHO - 09/12/23 Cardiac Cath - 1999  Sleep Study - denies CPAP - n/a  No DM  Last dose of GLP1 agonist-  n/a GLP1 instructions:  n/a  Blood Thinner Instructions: last does of Eliquis  2/7 Aspirin  Instructions: continue Aspirin   ERAS Protcol - NPO   COVID TEST- no   Anesthesia review: yes  Patient's wife denies that the patient has shortness of breath, fever, cough and chest pain over the phone call   All instructions explained to the patient's wife, with a verbal understanding of the material. Patient's wife agrees to go over the instructions while at home for a better understanding.     Patient has completed his doxycycline  that was prescribed to him for a "boil" on his right buttock.  Patient's wife states that she will look at the area when the patient arrives home from dialysis.  She did state that the patient stated that the area was no longer painful but she was unsure if it was still red or draining.  Gaylin Ke at Dr. Horald Lyme office was notified.

## 2024-01-27 ENCOUNTER — Ambulatory Visit (HOSPITAL_COMMUNITY): Payer: Self-pay | Admitting: Vascular Surgery

## 2024-01-27 ENCOUNTER — Ambulatory Visit (HOSPITAL_BASED_OUTPATIENT_CLINIC_OR_DEPARTMENT_OTHER): Payer: Medicare HMO | Admitting: Vascular Surgery

## 2024-01-27 ENCOUNTER — Ambulatory Visit (HOSPITAL_COMMUNITY)
Admission: RE | Admit: 2024-01-27 | Discharge: 2024-01-27 | Disposition: A | Discharge status: Home / Self Care | Payer: Medicare HMO | Attending: Vascular Surgery | Admitting: Vascular Surgery

## 2024-01-27 ENCOUNTER — Other Ambulatory Visit: Payer: Self-pay

## 2024-01-27 ENCOUNTER — Encounter (HOSPITAL_COMMUNITY): Payer: Self-pay | Admitting: Vascular Surgery

## 2024-01-27 ENCOUNTER — Encounter (HOSPITAL_COMMUNITY)
Admission: RE | Disposition: A | Discharge status: Home / Self Care | Payer: Self-pay | Source: Home / Self Care | Attending: Vascular Surgery

## 2024-01-27 DIAGNOSIS — I132 Hypertensive heart and chronic kidney disease with heart failure and with stage 5 chronic kidney disease, or end stage renal disease: Secondary | ICD-10-CM | POA: Diagnosis not present

## 2024-01-27 DIAGNOSIS — N185 Chronic kidney disease, stage 5: Secondary | ICD-10-CM

## 2024-01-27 DIAGNOSIS — Z955 Presence of coronary angioplasty implant and graft: Secondary | ICD-10-CM | POA: Insufficient documentation

## 2024-01-27 DIAGNOSIS — Z87891 Personal history of nicotine dependence: Secondary | ICD-10-CM | POA: Diagnosis not present

## 2024-01-27 DIAGNOSIS — I252 Old myocardial infarction: Secondary | ICD-10-CM | POA: Diagnosis not present

## 2024-01-27 DIAGNOSIS — I1311 Hypertensive heart and chronic kidney disease without heart failure, with stage 5 chronic kidney disease, or end stage renal disease: Secondary | ICD-10-CM | POA: Diagnosis not present

## 2024-01-27 DIAGNOSIS — Z951 Presence of aortocoronary bypass graft: Secondary | ICD-10-CM | POA: Insufficient documentation

## 2024-01-27 DIAGNOSIS — I5032 Chronic diastolic (congestive) heart failure: Secondary | ICD-10-CM | POA: Diagnosis not present

## 2024-01-27 DIAGNOSIS — I700 Atherosclerosis of aorta: Secondary | ICD-10-CM | POA: Insufficient documentation

## 2024-01-27 DIAGNOSIS — Z8546 Personal history of malignant neoplasm of prostate: Secondary | ICD-10-CM | POA: Insufficient documentation

## 2024-01-27 DIAGNOSIS — Z992 Dependence on renal dialysis: Secondary | ICD-10-CM | POA: Insufficient documentation

## 2024-01-27 DIAGNOSIS — I2510 Atherosclerotic heart disease of native coronary artery without angina pectoris: Secondary | ICD-10-CM | POA: Insufficient documentation

## 2024-01-27 DIAGNOSIS — I120 Hypertensive chronic kidney disease with stage 5 chronic kidney disease or end stage renal disease: Secondary | ICD-10-CM | POA: Diagnosis not present

## 2024-01-27 DIAGNOSIS — N186 End stage renal disease: Secondary | ICD-10-CM | POA: Diagnosis not present

## 2024-01-27 DIAGNOSIS — E1122 Type 2 diabetes mellitus with diabetic chronic kidney disease: Secondary | ICD-10-CM

## 2024-01-27 DIAGNOSIS — D631 Anemia in chronic kidney disease: Secondary | ICD-10-CM | POA: Diagnosis not present

## 2024-01-27 HISTORY — PX: AV FISTULA PLACEMENT: SHX1204

## 2024-01-27 LAB — POCT I-STAT, CHEM 8
BUN: 35 mg/dL — ABNORMAL HIGH (ref 8–23)
Calcium, Ion: 1.04 mmol/L — ABNORMAL LOW (ref 1.15–1.40)
Chloride: 96 mmol/L — ABNORMAL LOW (ref 98–111)
Creatinine, Ser: 5.9 mg/dL — ABNORMAL HIGH (ref 0.61–1.24)
Glucose, Bld: 116 mg/dL — ABNORMAL HIGH (ref 70–99)
HCT: 37 % — ABNORMAL LOW (ref 39.0–52.0)
Hemoglobin: 12.6 g/dL — ABNORMAL LOW (ref 13.0–17.0)
Potassium: 3.7 mmol/L (ref 3.5–5.1)
Sodium: 136 mmol/L (ref 135–145)
TCO2: 29 mmol/L (ref 22–32)

## 2024-01-27 SURGERY — ARTERIOVENOUS (AV) FISTULA CREATION
Anesthesia: General | Site: Arm Upper | Laterality: Left

## 2024-01-27 MED ORDER — SODIUM CHLORIDE 0.9 % IV SOLN: INTRAVENOUS | Status: DC

## 2024-01-27 MED ORDER — 0.9 % SODIUM CHLORIDE (POUR BTL) OPTIME
TOPICAL | Status: DC | PRN
  Administered 2024-01-27: 1000 mL

## 2024-01-27 MED ORDER — CHLORHEXIDINE GLUCONATE 0.12 % MT SOLN
OROMUCOSAL | Status: AC
Start: 2024-01-27 — End: 2024-01-27
  Administered 2024-01-27: 15 mL via OROMUCOSAL
  Filled 2024-01-27: qty 15

## 2024-01-27 MED ORDER — CHLORHEXIDINE GLUCONATE 0.12 % MT SOLN: 15.0000 mL | Freq: Once | OROMUCOSAL | Status: AC

## 2024-01-27 MED ORDER — FENTANYL CITRATE (PF) 100 MCG/2ML IJ SOLN
INTRAMUSCULAR | Status: AC
  Filled 2024-01-27: qty 2

## 2024-01-27 MED ORDER — ONDANSETRON HCL 4 MG/2ML IJ SOLN
INTRAMUSCULAR | Status: DC | PRN
  Administered 2024-01-27: 4 mg via INTRAVENOUS

## 2024-01-27 MED ORDER — LIDOCAINE 2% (20 MG/ML) 5 ML SYRINGE
INTRAMUSCULAR | Status: DC | PRN
  Administered 2024-01-27: 60 mg via INTRAVENOUS

## 2024-01-27 MED ORDER — ONDANSETRON HCL 4 MG/2ML IJ SOLN
INTRAMUSCULAR | Status: AC
  Filled 2024-01-27: qty 2

## 2024-01-27 MED ORDER — CHLORHEXIDINE GLUCONATE 4 % EX SOLN
60.0000 mL | Freq: Once | CUTANEOUS | Status: DC
Start: 2024-01-28 — End: 2024-01-29

## 2024-01-27 MED ORDER — CEFAZOLIN SODIUM-DEXTROSE 2-4 GM/100ML-% IV SOLN
2.0000 g | INTRAVENOUS | Status: AC
  Administered 2024-01-27: 2 g via INTRAVENOUS
  Filled 2024-01-27: qty 100

## 2024-01-27 MED ORDER — SODIUM CHLORIDE 0.9% FLUSH
3.0000 mL | INTRAVENOUS | Status: DC | PRN
Start: 2024-01-27 — End: 2024-01-29

## 2024-01-27 MED ORDER — FENTANYL CITRATE (PF) 250 MCG/5ML IJ SOLN
INTRAMUSCULAR | Status: AC
  Filled 2024-01-27: qty 5

## 2024-01-27 MED ORDER — FENTANYL CITRATE (PF) 250 MCG/5ML IJ SOLN: INTRAMUSCULAR | Status: DC | PRN

## 2024-01-27 MED ORDER — HEPARIN 6000 UNIT IRRIGATION SOLUTION
Status: DC | PRN
  Administered 2024-01-27: 1

## 2024-01-27 MED ORDER — ORAL CARE MOUTH RINSE: 15.0000 mL | Freq: Once | OROMUCOSAL | Status: AC

## 2024-01-27 MED ORDER — ONDANSETRON HCL 4 MG/2ML IJ SOLN: 4.0000 mg | Freq: Four times a day (QID) | INTRAMUSCULAR | Status: DC | PRN

## 2024-01-27 MED ORDER — CHLORHEXIDINE GLUCONATE 4 % EX SOLN: 60.0000 mL | Freq: Once | CUTANEOUS | Status: DC

## 2024-01-27 MED ORDER — PROPOFOL 10 MG/ML IV BOLUS
INTRAVENOUS | Status: DC | PRN
  Administered 2024-01-27: 80 mg via INTRAVENOUS

## 2024-01-27 MED ORDER — OXYCODONE HCL 5 MG PO TABS: 5.0000 mg | ORAL_TABLET | Freq: Once | ORAL | Status: DC | PRN

## 2024-01-27 MED ORDER — OXYCODONE-ACETAMINOPHEN 5-325 MG PO TABS: 1.0000 | ORAL_TABLET | Freq: Four times a day (QID) | ORAL | 0 refills | Status: DC | PRN

## 2024-01-27 MED ORDER — SODIUM CHLORIDE 0.9% FLUSH: 3.0000 mL | Freq: Two times a day (BID) | INTRAVENOUS | Status: DC

## 2024-01-27 MED ORDER — MIDAZOLAM HCL 2 MG/2ML IJ SOLN
INTRAMUSCULAR | Status: AC
  Filled 2024-01-27: qty 2

## 2024-01-27 MED ORDER — FENTANYL CITRATE (PF) 250 MCG/5ML IJ SOLN
INTRAMUSCULAR | Status: DC | PRN
  Administered 2024-01-27: 25 ug via INTRAVENOUS
  Administered 2024-01-27: 50 ug via INTRAVENOUS

## 2024-01-27 MED ORDER — PROPOFOL 10 MG/ML IV BOLUS
INTRAVENOUS | Status: AC
  Filled 2024-01-27: qty 20

## 2024-01-27 MED ORDER — FENTANYL CITRATE (PF) 100 MCG/2ML IJ SOLN: 25.0000 ug | INTRAMUSCULAR | Status: DC | PRN

## 2024-01-27 MED ORDER — HEPARIN 6000 UNIT IRRIGATION SOLUTION
Status: AC
  Filled 2024-01-27: qty 500

## 2024-01-27 MED ORDER — LIDOCAINE 2% (20 MG/ML) 5 ML SYRINGE
INTRAMUSCULAR | Status: AC
  Filled 2024-01-27: qty 5

## 2024-01-27 MED ORDER — OXYCODONE HCL 5 MG/5ML PO SOLN: 5.0000 mg | Freq: Once | ORAL | Status: DC | PRN

## 2024-01-27 SURGICAL SUPPLY — 27 items
ARMBAND PINK RESTRICT EXTREMIT (MISCELLANEOUS) ×1 IMPLANT
BAG COUNTER SPONGE SURGICOUNT (BAG) ×1 IMPLANT
CANISTER SUCT 3000ML PPV (MISCELLANEOUS) ×1 IMPLANT
CLIP LIGATING EXTRA MED SLVR (CLIP) ×1 IMPLANT
CLIP LIGATING EXTRA SM BLUE (MISCELLANEOUS) ×1 IMPLANT
COVER PROBE W GEL 5X96 (DRAPES) IMPLANT
DERMABOND ADVANCED .7 DNX12 (GAUZE/BANDAGES/DRESSINGS) ×1 IMPLANT
ELECT REM PT RETURN 9FT ADLT (ELECTROSURGICAL) ×1 IMPLANT
ELECTRODE REM PT RTRN 9FT ADLT (ELECTROSURGICAL) ×1 IMPLANT
GLOVE BIO SURGEON STRL SZ7.5 (GLOVE) ×1 IMPLANT
GOWN STRL REUS W/ TWL LRG LVL3 (GOWN DISPOSABLE) ×2 IMPLANT
GOWN STRL REUS W/ TWL XL LVL3 (GOWN DISPOSABLE) ×1 IMPLANT
INSERT FOGARTY SM (MISCELLANEOUS) IMPLANT
KIT BASIN OR (CUSTOM PROCEDURE TRAY) ×1 IMPLANT
KIT TURNOVER KIT B (KITS) ×1 IMPLANT
NS IRRIG 1000ML POUR BTL (IV SOLUTION) ×1 IMPLANT
PACK CV ACCESS (CUSTOM PROCEDURE TRAY) ×1 IMPLANT
PAD ARMBOARD 7.5X6 YLW CONV (MISCELLANEOUS) ×2 IMPLANT
POWDER SURGICEL 3.0 GRAM (HEMOSTASIS) IMPLANT
SLING ARM FOAM STRAP LRG (SOFTGOODS) IMPLANT
SLING ARM FOAM STRAP MED (SOFTGOODS) IMPLANT
SUT MNCRL AB 4-0 PS2 18 (SUTURE) ×1 IMPLANT
SUT PROLENE 6 0 BV (SUTURE) ×1 IMPLANT
SUT VIC AB 3-0 SH 27X BRD (SUTURE) ×1 IMPLANT
TOWEL GREEN STERILE (TOWEL DISPOSABLE) ×1 IMPLANT
UNDERPAD 30X36 HEAVY ABSORB (UNDERPADS AND DIAPERS) ×1 IMPLANT
WATER STERILE IRR 1000ML POUR (IV SOLUTION) ×1 IMPLANT

## 2024-01-27 NOTE — H&P (Signed)
HPI:   Jacob Farmer is a 78 y.o. male I previously seen for concern of chronic mesenteric ischemia with SMA stenosis.  Jacob Farmer is subsequently elected to go on dialysis and is currently dialyzing via catheter.  Jacob Farmer is right-hand dominant.  Denies any previous left upper arm, chest or breast surgeries and does not have a pacemaker or defibrillator.  Patient does not take blood thinners.  Jacob Farmer is feeling much better since initiating dialysis.       Past Medical History:  Diagnosis Date   CAD (coronary artery disease)      Acute myocardial infarction treated with TPA in 1990; 1999-stents to circumflex and RCA; residual total occlusion of the left anterior descending; normal ejection fraction; stress nuclear in 2001-distal anteroseptal ischemia; normal LV function   CKD (chronic kidney disease) stage 4, GFR 15-29 ml/min (HCC)     First degree heart block     History of gastric ulcer     History of kidney stones     History of MI (myocardial infarction)      1990-  treated w/ TPA   Hyperlipidemia     Hypertension     Jaundice     OA (osteoarthritis)     Prediabetes     Prostate cancer (HCC)      Stage T1c , Gleason 3+4,  PSA 4.7,  vol 24cc--  scheduled for radiative seed implants   Psoriatic arthritis (HCC)     RA (rheumatoid arthritis) (HCC)      Dr. Beatrix Fetters   Sinus bradycardia     Wears dentures               Family History  Problem Relation Age of Onset   Heart attack Mother     Coronary artery disease Father     Ulcerative colitis Father     Ulcers Father     Breast cancer Sister     COPD Brother     Pancreatic cancer Brother     Healthy Sister     Diabetes Brother     Cirrhosis Son     Seizures Son               Past Surgical History:  Procedure Laterality Date   BIOPSY   08/27/2019    Procedure: BIOPSY;  Surgeon: Malissa Hippo, MD;  [facility];  Service: Endoscopy;;  gastric   BIOPSY   04/03/2022    Procedure: BIOPSY;  Surgeon: Dolores Frame, MD;  [facility];  Service: Gastroenterology;;   BIOPSY   04/21/2023    Procedure: BIOPSY;  Surgeon: Lanelle Bal, DO;  [facility];  Service: Endoscopy;;   callus removal Left 09/13/2022    left foot   CARDIOVASCULAR STRESS TEST   2001  per Dr Dietrich Pates clinic note    distal anteroseptal ischemia,  normal LVF   COLONOSCOPY   last one 2006 (approx)   COLONOSCOPY WITH PROPOFOL N/A 04/03/2022    Procedure: COLONOSCOPY WITH PROPOFOL;  Surgeon: Dolores Frame, MD;  [facility]: [facility];  Service: Gastroenterology;  Laterality: N/A;  945   CORONARY ANGIOPLASTY WITH STENT PLACEMENT   1999    DES x2  to CFX and RCA/  residual total occlusion LAD,  normal LVEF   ENTEROSCOPY N/A 04/24/2023    Procedure: ENTEROSCOPY;  Surgeon: Dolores Frame, MD;  [facility];  Service: Gastroenterology;  Laterality: N/A;   ESOPHAGOGASTRODUODENOSCOPY (EGD)  WITH PROPOFOL N/A 08/27/2019    Procedure: ESOPHAGOGASTRODUODENOSCOPY (EGD) WITH PROPOFOL;  Surgeon: Malissa Hippo, MD;  [facility];  Service: Endoscopy;  Laterality: N/A;  10:10   ESOPHAGOGASTRODUODENOSCOPY (EGD) WITH PROPOFOL N/A 04/03/2022    Procedure: ESOPHAGOGASTRODUODENOSCOPY (EGD) WITH PROPOFOL;  Surgeon: Dolores Frame, MD;  [facility];  Service: Gastroenterology;  Laterality: N/A;   ESOPHAGOGASTRODUODENOSCOPY (EGD) WITH PROPOFOL N/A 02/16/2023    Procedure: ESOPHAGOGASTRODUODENOSCOPY (EGD) WITH PROPOFOL;  Surgeon: Dolores Frame, MD;  [facility];  Service: Gastroenterology;  Laterality: N/A;   ESOPHAGOGASTRODUODENOSCOPY (EGD) WITH PROPOFOL N/A 04/21/2023    Procedure: ESOPHAGOGASTRODUODENOSCOPY (EGD) WITH PROPOFOL;  Surgeon: Lanelle Bal, DO;  [facility];  Service: Endoscopy;  Laterality: N/A;   EYE SURGERY Bilateral      cataract   GIVENS CAPSULE STUDY N/A 04/22/2023    Procedure: GIVENS CAPSULE STUDY;  Surgeon:  Dolores Frame, MD;  [facility];  Service: Gastroenterology;  Laterality: N/A;   HEMOSTASIS CLIP PLACEMENT   04/24/2023    Procedure: HEMOSTASIS CLIP PLACEMENT;  Surgeon: Dolores Frame, MD;  [facility];  Service: Gastroenterology;;   HOT HEMOSTASIS   04/03/2022    Procedure: HOT HEMOSTASIS (ARGON PLASMA COAGULATION/BICAP);  Surgeon: Marguerita Merles, Reuel Boom, MD;  [facility];  Service: Gastroenterology;;   HOT HEMOSTASIS   04/24/2023    Procedure: HOT HEMOSTASIS (ARGON PLASMA COAGULATION/BICAP);  Surgeon: Marguerita Merles, Reuel Boom, MD;  [facility];  Service: Gastroenterology;;   IR FLUORO GUIDE CV LINE RIGHT   05/30/2023   IR US GUIDE VASC ACCESS RIGHT   05/30/2023   POLYPECTOMY   04/03/2022    Procedure: POLYPECTOMY;  Surgeon: Dolores Frame, MD;  [facility];  Service: Gastroenterology;;   POSTERIOR CERVICAL FUSION/FORAMINOTOMY N/A 05/26/2017    Procedure: RIGHT C4-5 FORAMINOTOMY WITH EXCISION OF HERNIATED DISC;  Surgeon: Kerrin Champagne, MD;  Location: [facility];  Service: Orthopedics;  Laterality: N/A;   RADIOACTIVE SEED IMPLANT N/A 01/11/2016    Procedure: RADIOACTIVE SEED IMPLANT/BRACHYTHERAPY IMPLANT;  Surgeon: Marcine Matar, MD;  Location: [facility];  Service: Urology;  Laterality: N/A;   73  seeds implanted no seeds founds in bladder   SCLEROTHERAPY   04/24/2023    Procedure: SCLEROTHERAPY;  Surgeon: Marguerita Merles, Reuel Boom, MD;  [facility];  Service: Gastroenterology;;   SUBMUCOSAL TATTOO INJECTION   04/24/2023    Procedure: SUBMUCOSAL TATTOO INJECTION;  Surgeon: Marguerita Merles, Reuel Boom, MD;  [facility];  Service: Gastroenterology;;          Short Social History:  Social History         Tobacco Use   Smoking status: Former      Current packs/day: 0.00      Average packs/day: 1 pack/day for 30.0 years (30.0 ttl pk-yrs)      Types: Cigarettes       Start date: 05/22/1959      Quit date: 05/21/1989      Years since quitting: 34.2      Passive exposure: Never   Smokeless tobacco: Never  Substance Use Topics   Alcohol use: Yes      Comment: 1 drink per day      Allergies  No Known Allergies           Current Outpatient Medications  Medication Sig Dispense Refill   allopurinol (ZYLOPRIM) 100 MG tablet TAKE 1/2 TABLET EVERY OTHER DAY  23 tablet 0   amLODipine (NORVASC) 10 MG tablet Take 1 tablet (10 mg total) by mouth daily. 90 tablet 3   calcitRIOL (ROCALTROL) 0.25 MCG capsule Take 0.25 mcg by mouth. M, W, F       Colchicine 0.6 MG CAPS Take 0.6 mg by mouth daily as needed (Gout). (Patient not taking: Reported on 08/13/2023)       Dupilumab (DUPIXENT) 300 MG/2ML SOPN Inject 300 mg into the skin every 14 (fourteen) days. Starting at day 15 for maintenance. 12 mL 1   folic acid (FOLVITE) 400 MCG tablet Take 400 mcg by mouth daily.       hydrALAZINE (APRESOLINE) 25 MG tablet Take 1 tablet (25 mg total) by mouth 2 (two) times daily. 180 tablet 3   leflunomide (ARAVA) 10 MG tablet Take 1 tablet (10 mg total) by mouth daily. 90 tablet 0   nitroGLYCERIN (NITROSTAT) 0.4 MG SL tablet Place 0.4 mg under the tongue every 5 (five) minutes as needed for chest pain.        pantoprazole (PROTONIX) 40 MG tablet Take 1 tablet (40 mg total) by mouth 2 (two) times daily. 180 tablet 1   rosuvastatin (CRESTOR) 40 MG tablet Take 1 tablet (40 mg total) by mouth daily. 90 tablet 3   triamcinolone cream (KENALOG) 0.1 % Apply 1 Application topically daily. (Patient taking differently: Apply 1 Application topically as needed.)                   Current Facility-Administered Medications  Medication Dose Route Frequency Provider Last Rate Last Admin   degarelix (FIRMAGON) injection 240 mg  240 mg Subcutaneous Once Marcine Matar, MD            Review of Systems  Constitutional:  Constitutional negative. HENT: HENT negative.  Eyes: Eyes negative.   Respiratory: Respiratory negative.  Cardiovascular: Cardiovascular negative.  GI: Gastrointestinal negative.  Musculoskeletal: Musculoskeletal negative.  Skin: Skin negative.  Neurological: Neurological negative. Hematologic: Hematologic/lymphatic negative.  Psychiatric: Psychiatric negative.          Objective:    Vitals:   01/27/24 1005 01/27/24 1036  BP: (!) 193/89 (!) 182/92  Pulse: 74   Resp: 18   Temp: 98.1 F (36.7 C)   SpO2: 99%     Physical Exam HENT:     Head: Normocephalic.     Nose: Nose normal.     Mouth/Throat:     Mouth: Mucous membranes are moist.  Eyes:     Pupils: Pupils are equal, round, and reactive to light.  Cardiovascular:     Rate and Rhythm: Normal rate.     Pulses:          Radial pulses are 2+ on the right side and 2+ on the left side.  Pulmonary:     Effort: Pulmonary effort is normal.  Abdominal:     General: Abdomen is flat.     Palpations: Abdomen is soft.  Musculoskeletal:        General: Normal range of motion.     Cervical back: Normal range of motion and neck supple.     Right lower leg: No edema.     Left lower leg: No edema.  Skin:    General: Skin is warm.     Capillary Refill: Capillary refill takes less than 2 seconds.  Neurological:     General: No focal deficit present.     Mental Status: Jacob Farmer is alert.  Psychiatric:  Mood and Affect: Mood normal.        Thought Content: Thought content normal.        Judgment: Judgment normal.        Data: Right Pre-Dialysis Findings:  +-----------------------+----------+--------------------+---------+--------  +  Location              PSV (cm/s)Intralum. Diam. (cm)Waveform  Comments  +-----------------------+----------+--------------------+---------+--------  +  Brachial Antecub. fossa58        0.69                triphasic           +-----------------------+----------+--------------------+---------+--------  +  Radial Art at Wrist    75        0.25                 triphasic           +-----------------------+----------+--------------------+---------+--------  +  Ulnar Art at Wrist     86        0.19                triphasic           +-----------------------+----------+--------------------+---------+--------  +       Left Pre-Dialysis Findings:  +-----------------------+----------+--------------------+---------+--------  +  Location              PSV (cm/s)Intralum. Diam. (cm)Waveform  Comments  +-----------------------+----------+--------------------+---------+--------  +  Brachial Antecub. fossa62        0.55                triphasic           +-----------------------+----------+--------------------+---------+--------  +  Radial Art at Wrist    74        0.27                triphasic           +-----------------------+----------+--------------------+---------+--------  +  Ulnar Art at Wrist     44        0.23                triphasic           +-----------------------+----------+--------------------+---------+--------  +        Summary:    Right: Patent brachial, radial, and ulnar arteries.  Left: Patent brachial, radial, and ulnar arteries.       Right Cephalic   Diameter (cm)Depth (cm)Findings  +-----------------+-------------+----------+--------+  Shoulder            0.46                         +-----------------+-------------+----------+--------+  Prox upper arm       0.39                         +-----------------+-------------+----------+--------+  Mid upper arm        0.46                         +-----------------+-------------+----------+--------+  Dist upper arm       0.48                         +-----------------+-------------+----------+--------+  Antecubital fossa    0.42                         +-----------------+-------------+----------+--------+  Prox forearm  0.27                          +-----------------+-------------+----------+--------+  Mid forearm          0.27                         +-----------------+-------------+----------+--------+  Dist forearm         0.24                         +-----------------+-------------+----------+--------+   +-----------------+-------------+----------+--------+  Right Basilic    Diameter (cm)Depth (cm)Findings  +-----------------+-------------+----------+--------+  Prox upper arm       0.27                         +-----------------+-------------+----------+--------+  Mid upper arm        0.29                         +-----------------+-------------+----------+--------+  Dist upper arm       0.36                         +-----------------+-------------+----------+--------+  Antecubital fossa    0.29                         +-----------------+-------------+----------+--------+   +-----------------+-------------+----------+--------+  Left Cephalic    Diameter (cm)Depth (cm)Findings  +-----------------+-------------+----------+--------+  Shoulder            0.42                         +-----------------+-------------+----------+--------+  Prox upper arm       0.51                         +-----------------+-------------+----------+--------+  Mid upper arm        0.46                         +-----------------+-------------+----------+--------+  Dist upper arm       0.45                         +-----------------+-------------+----------+--------+  Antecubital fossa    0.39                         +-----------------+-------------+----------+--------+  Prox forearm         0.24                         +-----------------+-------------+----------+--------+  Mid forearm          0.25                         +-----------------+-------------+----------+--------+  Dist forearm         0.25                          +-----------------+-------------+----------+--------+   +-----------------+-------------+----------+--------+  Left Basilic     Diameter (cm)Depth (cm)Findings  +-----------------+-------------+----------+--------+  Prox upper arm       0.33                         +-----------------+-------------+----------+--------+  Mid upper arm        0.30                         +-----------------+-------------+----------+--------+  Dist upper arm       0.32                         +-----------------+-------------+----------+--------+  Antecubital fossa    0.31                         +-----------------+-------------+----------+--------+   Summary: Right: Patent cephalic and basilic veins.  Left: Patent cephalic and basilic veins.       Assessment/Plan:    78 year old male now with end-stage renal disease on dialysis via right IJ catheter.  Plan will be for left arm AV fistula versus graft on a today in the near future.  Currently Jacob Farmer is dialyzing Mondays, Wednesdays and Fridays.  All risk benefits alternatives discussed with patient and his significant other and they demonstrate good understanding.   Bethan Adamek C. Randie Heinz, MD [facility] Office: [phone] Pager: [phone]

## 2024-01-27 NOTE — Anesthesia Procedure Notes (Signed)
Procedure Name: LMA Insertion Date/Time: 01/27/2024 11:34 AM  Performed by: Colbert Coyer, CRNAPre-anesthesia Checklist: Patient identified, Emergency Drugs available, Suction available and Patient being monitored Patient Re-evaluated:Patient Re-evaluated prior to induction Oxygen Delivery Method: Circle System Utilized Preoxygenation: Pre-oxygenation with 100% oxygen Induction Type: IV induction Ventilation: Mask ventilation without difficulty LMA: LMA inserted LMA Size: 4.0 Number of attempts: 1 Placement Confirmation: positive ETCO2 Dental Injury: Teeth and Oropharynx as per pre-operative assessment

## 2024-01-27 NOTE — Op Note (Signed)
    Patient name: Jacob Farmer MRN: [id number] DOB: Dec 19, 1945 Sex: male  01/27/2024 Pre-operative Diagnosis: End-stage renal disease Post-operative diagnosis:  Same Surgeon:  Apolinar Junes C. Randie Heinz, MD Assistant: Nathanial Rancher, PA Procedure Performed:  Left brachial artery to cephalic vein AV fistula creation  Indications: 78 year old male now on dialysis via right IJ tunneled dialysis catheter.  He is right-hand dominant appears to have suitable vein for fistula creation on the left we have discussed proceeding with left arm fistula versus graft and we have discussed the risk benefits alternatives and he demonstrates good understanding.  Given the complexity of the case,  the assistant was necessary in order to expedite the procedure and safely perform the technical aspects of the operation.  The assistant provided traction and countertraction to assist with exposure of the artery and vein.  They also assisted with suture ligation of multiple venous branches.  They played a critical role in the anastomosis. These skills, especially following the Prolene suture for the anastomosis, could not have been adequately performed by a scrub tech assistant.    Findings: The cephalic vein at the antecubital measured approximately 3-1/2 mm and the brachial artery was at least 4 mm and free of disease.  At completion there was a very strong thrill in the fistula that could be felt throughout the upper arm with a palpable radial artery pulse at the wrist.   Procedure:  The patient was identified in the holding area and taken to the operating room where he was placed supine operative table and LMA anesthesia was induced.  He was sterilely prepped and draped in the left upper extremity in usual fashion, antibiotics were administered a timeout was called.  We began using ultrasound identified a suitable cephalic vein just next to the brachial artery below the antecubitum.  A transverse incision was created we dissected  the vein outburst divided branches between ties and marked this for orientation.  We dissected to the deep fascia identified the brachial artery and encircled this with vessel loop.  The vein was then transected and tied off distally spatulated flushed with heparinized saline and clamped.  The artery was clamped distally and proximally opened longitudinally flushed with heparinized saline in both directions and reclamped.  The vein was then sewn into side with 6-0 Prolene suture.  Prior completion we allowed flushing all directions.  Upon completion there was a very strong thrill in the vein traced with Doppler of the upper arm and a palpable radial artery pulse at the wrist.  We freed up some soft tissue around the vein and the level of the seat without tension.  The wound was irrigated hemostasis was obtained we closed in layers of Vicryl and Monocryl.  Dermabond was placed at the skin level.  The patient was awakened from anesthesia having tolerated the procedure well without immediate complication.  All counts were correct at completion.  EBL: 25 cc   Lynnett Langlinais C. Randie Heinz, MD [facility] [place] Office: [phone] Pager: [phone]

## 2024-01-27 NOTE — Discharge Instructions (Signed)
[facility]  Discharge Instructions  AV Fistula or Graft Surgery for Dialysis Access  Please refer to the following instructions for your post-procedure care. Your surgeon or physician assistant will discuss any changes with you.  Activity  You may drive the day following your surgery, if you are comfortable and no longer taking prescription pain medication. Resume full activity as the soreness in your incision resolves.  Bathing/Showering  You may shower after you go home. Keep your incision dry for 48 hours. Do not soak in a bathtub, hot tub, or swim until the incision heals completely. You may not shower if you have a hemodialysis catheter.  Incision Care  Clean your incision with mild soap and water after 48 hours. Pat the area dry with a clean towel. You do not need a bandage unless otherwise instructed. Do not apply any ointments or creams to your incision. You may have skin glue on your incision. Do not peel it off. It will come off on its own in about one week. Your arm may swell a bit after surgery. To reduce swelling use pillows to elevate your arm so it is above your heart. Your doctor will tell you if you need to lightly wrap your arm with an ACE bandage.  Diet  Resume your normal diet. There are not special food restrictions following this procedure. In order to heal from your surgery, it is CRITICAL to get adequate nutrition. Your body requires vitamins, minerals, and protein. Vegetables are the best source of vitamins and minerals. Vegetables also provide the perfect balance of protein. Processed food has little nutritional value, so try to avoid this.  Medications  Resume taking all of your medications. If your incision is causing pain, you may take over-the counter pain relievers such as acetaminophen (Tylenol). If you were prescribed a stronger pain medication, please be aware these medications can cause nausea and constipation. Prevent  nausea by taking the medication with a snack or meal. Avoid constipation by drinking plenty of fluids and eating foods with high amount of fiber, such as fruits, vegetables, and grains.  Do not take Tylenol if you are taking prescription pain medications.  Follow up Your surgeon may want to see you in the office following your access surgery. If so, this will be arranged at the time of your surgery.  Please call us immediately for any of the following conditions:  Increased pain, redness, drainage (pus) from your incision site Fever of 101 degrees or higher Severe or worsening pain at your incision site Hand pain or numbness.  Reduce your risk of vascular disease:  Stop smoking. If you would like help, call [facility] at 1-800-QUIT-NOW ((909)138-0437) or [place] at [phone]  Manage your cholesterol Maintain a desired weight Control your diabetes Keep your blood pressure down  Dialysis  It will take several weeks to several months for your new dialysis access to be ready for use. Your surgeon will determine when it is okay to use it. Your nephrologist will continue to direct your dialysis. You can continue to use your Permcath until your new access is ready for use.   01/27/2024 Jacob Farmer [id number] 03-28-1946  Surgeon(s): Maeola Harman, MD  Procedure(s): CREATION OF LEFT ARM ARTERIOVENOUS (AV) FISTULA   May stick graft immediately   May stick graft on designated area only:   X Do not stick left AV fistula for 12 weeks    If you have any questions, please call the  office at [phone].

## 2024-01-27 NOTE — Transfer of Care (Signed)
Immediate Anesthesia Transfer of Care Note  Patient: Jacob Farmer  Procedure(s) Performed: CREATION OF LEFT ARM ARTERIOVENOUS (AV) FISTULA (Left: Arm Upper)  Patient Location: PACU  Anesthesia Type:General  Level of Consciousness: awake and patient cooperative  Airway & Oxygen Therapy: Patient Spontanous Breathing and Patient connected to nasal cannula oxygen  Post-op Assessment: Report given to RN and Post -op Vital signs reviewed and stable  Post vital signs: Reviewed and stable  Last Vitals:  Vitals Value Taken Time  BP 171/81 01/27/24 1232  Temp 97.8   Pulse 69 01/27/24 1234  Resp 15 01/27/24 1234  SpO2 100 % 01/27/24 1234  Vitals shown include unfiled device data.  Last Pain:  Vitals:   01/27/24 1024  TempSrc:   PainSc: 0-No pain         Complications: There were no known notable events for this encounter.

## 2024-01-28 ENCOUNTER — Encounter (HOSPITAL_COMMUNITY): Payer: Self-pay | Admitting: Vascular Surgery

## 2024-01-28 DIAGNOSIS — Z992 Dependence on renal dialysis: Secondary | ICD-10-CM | POA: Diagnosis not present

## 2024-01-28 DIAGNOSIS — N2581 Secondary hyperparathyroidism of renal origin: Secondary | ICD-10-CM | POA: Diagnosis not present

## 2024-01-28 DIAGNOSIS — N186 End stage renal disease: Secondary | ICD-10-CM | POA: Diagnosis not present

## 2024-01-29 ENCOUNTER — Encounter (HOSPITAL_COMMUNITY): Payer: Self-pay | Admitting: Vascular Surgery

## 2024-01-29 NOTE — Anesthesia Postprocedure Evaluation (Signed)
Anesthesia Post Note  Patient: Jacob Farmer  Procedure(s) Performed: CREATION OF LEFT ARM ARTERIOVENOUS (AV) FISTULA (Left: Arm Upper)     Patient location during evaluation: PACU Anesthesia Type: General Level of consciousness: awake and alert Pain management: pain level controlled Vital Signs Assessment: post-procedure vital signs reviewed and stable Respiratory status: spontaneous breathing, nonlabored ventilation, respiratory function stable and patient connected to nasal cannula oxygen Cardiovascular status: blood pressure returned to baseline and stable Postop Assessment: no apparent nausea or vomiting Anesthetic complications: no   There were no known notable events for this encounter.  Last Vitals:  Vitals:   01/27/24 1245 01/27/24 1300  BP: (!) 161/80 (!) 160/72  Pulse: 68 70  Resp: 12 12  Temp:  36.6 C  SpO2: 98% 100%    Last Pain:  Vitals:   01/27/24 1300  TempSrc:   PainSc: 0-No pain                 Dani Danis S

## 2024-01-30 DIAGNOSIS — Z992 Dependence on renal dialysis: Secondary | ICD-10-CM | POA: Diagnosis not present

## 2024-01-30 DIAGNOSIS — N186 End stage renal disease: Secondary | ICD-10-CM | POA: Diagnosis not present

## 2024-01-30 DIAGNOSIS — N2581 Secondary hyperparathyroidism of renal origin: Secondary | ICD-10-CM | POA: Diagnosis not present

## 2024-02-02 DIAGNOSIS — N2581 Secondary hyperparathyroidism of renal origin: Secondary | ICD-10-CM | POA: Diagnosis not present

## 2024-02-02 DIAGNOSIS — Z992 Dependence on renal dialysis: Secondary | ICD-10-CM | POA: Diagnosis not present

## 2024-02-02 DIAGNOSIS — N186 End stage renal disease: Secondary | ICD-10-CM | POA: Diagnosis not present

## 2024-02-03 LAB — HEMOGLOBIN A1C: Hemoglobin A1C: 5.7

## 2024-02-04 DIAGNOSIS — N2581 Secondary hyperparathyroidism of renal origin: Secondary | ICD-10-CM | POA: Diagnosis not present

## 2024-02-04 DIAGNOSIS — Z992 Dependence on renal dialysis: Secondary | ICD-10-CM | POA: Diagnosis not present

## 2024-02-04 DIAGNOSIS — N186 End stage renal disease: Secondary | ICD-10-CM | POA: Diagnosis not present

## 2024-02-06 ENCOUNTER — Other Ambulatory Visit (INDEPENDENT_AMBULATORY_CARE_PROVIDER_SITE_OTHER): Payer: Self-pay | Admitting: Gastroenterology

## 2024-02-06 DIAGNOSIS — N2581 Secondary hyperparathyroidism of renal origin: Secondary | ICD-10-CM | POA: Diagnosis not present

## 2024-02-06 DIAGNOSIS — N186 End stage renal disease: Secondary | ICD-10-CM | POA: Diagnosis not present

## 2024-02-06 DIAGNOSIS — Z992 Dependence on renal dialysis: Secondary | ICD-10-CM | POA: Diagnosis not present

## 2024-02-09 DIAGNOSIS — N186 End stage renal disease: Secondary | ICD-10-CM | POA: Diagnosis not present

## 2024-02-09 DIAGNOSIS — N2581 Secondary hyperparathyroidism of renal origin: Secondary | ICD-10-CM | POA: Diagnosis not present

## 2024-02-09 DIAGNOSIS — Z992 Dependence on renal dialysis: Secondary | ICD-10-CM | POA: Diagnosis not present

## 2024-02-11 DIAGNOSIS — N186 End stage renal disease: Secondary | ICD-10-CM | POA: Diagnosis not present

## 2024-02-11 DIAGNOSIS — N2581 Secondary hyperparathyroidism of renal origin: Secondary | ICD-10-CM | POA: Diagnosis not present

## 2024-02-11 DIAGNOSIS — Z992 Dependence on renal dialysis: Secondary | ICD-10-CM | POA: Diagnosis not present

## 2024-02-13 ENCOUNTER — Inpatient Hospital Stay (HOSPITAL_COMMUNITY)
Admission: EM | Admit: 2024-02-13 | Discharge: 2024-02-17 | DRG: 640 | Disposition: A | Discharge status: Home / Self Care | Payer: Medicare HMO | Attending: Internal Medicine | Admitting: Internal Medicine

## 2024-02-13 ENCOUNTER — Encounter (HOSPITAL_COMMUNITY): Payer: Self-pay

## 2024-02-13 ENCOUNTER — Emergency Department (HOSPITAL_COMMUNITY): Payer: Medicare HMO

## 2024-02-13 ENCOUNTER — Other Ambulatory Visit: Payer: Self-pay

## 2024-02-13 DIAGNOSIS — Z8673 Personal history of transient ischemic attack (TIA), and cerebral infarction without residual deficits: Secondary | ICD-10-CM

## 2024-02-13 DIAGNOSIS — M545 Low back pain, unspecified: Secondary | ICD-10-CM | POA: Diagnosis present

## 2024-02-13 DIAGNOSIS — I480 Paroxysmal atrial fibrillation: Secondary | ICD-10-CM | POA: Diagnosis not present

## 2024-02-13 DIAGNOSIS — E782 Mixed hyperlipidemia: Secondary | ICD-10-CM | POA: Diagnosis not present

## 2024-02-13 DIAGNOSIS — Z7982 Long term (current) use of aspirin: Secondary | ICD-10-CM | POA: Diagnosis not present

## 2024-02-13 DIAGNOSIS — N186 End stage renal disease: Secondary | ICD-10-CM

## 2024-02-13 DIAGNOSIS — M069 Rheumatoid arthritis, unspecified: Secondary | ICD-10-CM | POA: Diagnosis present

## 2024-02-13 DIAGNOSIS — Z1152 Encounter for screening for COVID-19: Secondary | ICD-10-CM

## 2024-02-13 DIAGNOSIS — N2581 Secondary hyperparathyroidism of renal origin: Secondary | ICD-10-CM | POA: Diagnosis not present

## 2024-02-13 DIAGNOSIS — E871 Hypo-osmolality and hyponatremia: Principal | ICD-10-CM | POA: Insufficient documentation

## 2024-02-13 DIAGNOSIS — M4807 Spinal stenosis, lumbosacral region: Secondary | ICD-10-CM | POA: Diagnosis not present

## 2024-02-13 DIAGNOSIS — L4050 Arthropathic psoriasis, unspecified: Secondary | ICD-10-CM | POA: Diagnosis present

## 2024-02-13 DIAGNOSIS — M898X9 Other specified disorders of bone, unspecified site: Secondary | ICD-10-CM | POA: Diagnosis present

## 2024-02-13 DIAGNOSIS — R531 Weakness: Secondary | ICD-10-CM | POA: Diagnosis not present

## 2024-02-13 DIAGNOSIS — R42 Dizziness and giddiness: Secondary | ICD-10-CM | POA: Diagnosis not present

## 2024-02-13 DIAGNOSIS — Z803 Family history of malignant neoplasm of breast: Secondary | ICD-10-CM

## 2024-02-13 DIAGNOSIS — D631 Anemia in chronic kidney disease: Secondary | ICD-10-CM | POA: Diagnosis present

## 2024-02-13 DIAGNOSIS — R7989 Other specified abnormal findings of blood chemistry: Secondary | ICD-10-CM

## 2024-02-13 DIAGNOSIS — Z79620 Long term (current) use of immunosuppressive biologic: Secondary | ICD-10-CM

## 2024-02-13 DIAGNOSIS — M549 Dorsalgia, unspecified: Secondary | ICD-10-CM | POA: Diagnosis not present

## 2024-02-13 DIAGNOSIS — K219 Gastro-esophageal reflux disease without esophagitis: Secondary | ICD-10-CM | POA: Diagnosis not present

## 2024-02-13 DIAGNOSIS — Z992 Dependence on renal dialysis: Secondary | ICD-10-CM

## 2024-02-13 DIAGNOSIS — R7881 Bacteremia: Secondary | ICD-10-CM | POA: Diagnosis present

## 2024-02-13 DIAGNOSIS — Z8249 Family history of ischemic heart disease and other diseases of the circulatory system: Secondary | ICD-10-CM

## 2024-02-13 DIAGNOSIS — I10 Essential (primary) hypertension: Secondary | ICD-10-CM | POA: Diagnosis present

## 2024-02-13 DIAGNOSIS — R7303 Prediabetes: Secondary | ICD-10-CM | POA: Diagnosis present

## 2024-02-13 DIAGNOSIS — I120 Hypertensive chronic kidney disease with stage 5 chronic kidney disease or end stage renal disease: Secondary | ICD-10-CM | POA: Diagnosis present

## 2024-02-13 DIAGNOSIS — Z955 Presence of coronary angioplasty implant and graft: Secondary | ICD-10-CM

## 2024-02-13 DIAGNOSIS — Z7901 Long term (current) use of anticoagulants: Secondary | ICD-10-CM | POA: Diagnosis not present

## 2024-02-13 DIAGNOSIS — Z800 Family history of malignant neoplasm of digestive organs: Secondary | ICD-10-CM

## 2024-02-13 DIAGNOSIS — Z825 Family history of asthma and other chronic lower respiratory diseases: Secondary | ICD-10-CM

## 2024-02-13 DIAGNOSIS — R519 Headache, unspecified: Secondary | ICD-10-CM | POA: Diagnosis not present

## 2024-02-13 DIAGNOSIS — M48061 Spinal stenosis, lumbar region without neurogenic claudication: Secondary | ICD-10-CM | POA: Diagnosis not present

## 2024-02-13 DIAGNOSIS — I2510 Atherosclerotic heart disease of native coronary artery without angina pectoris: Secondary | ICD-10-CM | POA: Diagnosis present

## 2024-02-13 DIAGNOSIS — N250 Renal osteodystrophy: Secondary | ICD-10-CM | POA: Diagnosis not present

## 2024-02-13 DIAGNOSIS — I252 Old myocardial infarction: Secondary | ICD-10-CM | POA: Diagnosis not present

## 2024-02-13 DIAGNOSIS — R6510 Systemic inflammatory response syndrome (SIRS) of non-infectious origin without acute organ dysfunction: Secondary | ICD-10-CM | POA: Diagnosis not present

## 2024-02-13 DIAGNOSIS — Z8546 Personal history of malignant neoplasm of prostate: Secondary | ICD-10-CM

## 2024-02-13 DIAGNOSIS — Z981 Arthrodesis status: Secondary | ICD-10-CM

## 2024-02-13 DIAGNOSIS — Z833 Family history of diabetes mellitus: Secondary | ICD-10-CM | POA: Diagnosis not present

## 2024-02-13 DIAGNOSIS — I6782 Cerebral ischemia: Secondary | ICD-10-CM | POA: Diagnosis not present

## 2024-02-13 DIAGNOSIS — Z87891 Personal history of nicotine dependence: Secondary | ICD-10-CM

## 2024-02-13 DIAGNOSIS — M47816 Spondylosis without myelopathy or radiculopathy, lumbar region: Secondary | ICD-10-CM | POA: Diagnosis not present

## 2024-02-13 DIAGNOSIS — G9389 Other specified disorders of brain: Secondary | ICD-10-CM | POA: Diagnosis not present

## 2024-02-13 DIAGNOSIS — I7143 Infrarenal abdominal aortic aneurysm, without rupture: Secondary | ICD-10-CM | POA: Diagnosis not present

## 2024-02-13 DIAGNOSIS — Z79899 Other long term (current) drug therapy: Secondary | ICD-10-CM

## 2024-02-13 LAB — TROPONIN I (HIGH SENSITIVITY)
Troponin I (High Sensitivity): 33 ng/L — ABNORMAL HIGH (ref <18)
Troponin I (High Sensitivity): 38 ng/L — ABNORMAL HIGH (ref <18)

## 2024-02-13 LAB — BASIC METABOLIC PANEL
Anion gap: 10 (ref 5–15)
BUN: 35 mg/dL — ABNORMAL HIGH (ref 8–23)
CO2: 27 mmol/L (ref 22–32)
Calcium: 8.6 mg/dL — ABNORMAL LOW (ref 8.9–10.3)
Chloride: 93 mmol/L — ABNORMAL LOW (ref 98–111)
Creatinine, Ser: 5.43 mg/dL — ABNORMAL HIGH (ref 0.61–1.24)
GFR, Estimated: 10 mL/min — ABNORMAL LOW (ref >60)
Glucose, Bld: 99 mg/dL (ref 70–99)
Potassium: 3.5 mmol/L (ref 3.5–5.1)
Sodium: 130 mmol/L — ABNORMAL LOW (ref 135–145)

## 2024-02-13 LAB — CBC WITH DIFFERENTIAL/PLATELET
Abs Immature Granulocytes: 0.17 10*3/uL — ABNORMAL HIGH (ref 0.00–0.07)
Basophils Absolute: 0 10*3/uL (ref 0.0–0.1)
Basophils Relative: 0 %
Eosinophils Absolute: 0 10*3/uL (ref 0.0–0.5)
Eosinophils Relative: 0 %
HCT: 29.3 % — ABNORMAL LOW (ref 39.0–52.0)
Hemoglobin: 9.7 g/dL — ABNORMAL LOW (ref 13.0–17.0)
Immature Granulocytes: 1 %
Lymphocytes Relative: 2 %
Lymphs Abs: 0.5 10*3/uL — ABNORMAL LOW (ref 0.7–4.0)
MCH: 30.7 pg (ref 26.0–34.0)
MCHC: 33.1 g/dL (ref 30.0–36.0)
MCV: 92.7 fL (ref 80.0–100.0)
Monocytes Absolute: 2.4 10*3/uL — ABNORMAL HIGH (ref 0.1–1.0)
Monocytes Relative: 12 %
Neutro Abs: 17.4 10*3/uL — ABNORMAL HIGH (ref 1.7–7.7)
Neutrophils Relative %: 85 %
Platelets: 167 10*3/uL (ref 150–400)
RBC: 3.16 MIL/uL — ABNORMAL LOW (ref 4.22–5.81)
RDW: 14.9 % (ref 11.5–15.5)
WBC: 20.4 10*3/uL — ABNORMAL HIGH (ref 4.0–10.5)
nRBC: 0 % (ref 0.0–0.2)

## 2024-02-13 LAB — URINALYSIS, W/ REFLEX TO CULTURE (INFECTION SUSPECTED)
Bilirubin Urine: NEGATIVE
Glucose, UA: 50 mg/dL — AB
Hgb urine dipstick: NEGATIVE
Ketones, ur: NEGATIVE mg/dL
Leukocytes,Ua: NEGATIVE
Nitrite: NEGATIVE
Protein, ur: 100 mg/dL — AB
Specific Gravity, Urine: 1.014 (ref 1.005–1.030)
pH: 8 (ref 5.0–8.0)

## 2024-02-13 LAB — LACTIC ACID, PLASMA
Lactic Acid, Venous: 1.3 mmol/L (ref 0.5–1.9)
Lactic Acid, Venous: 1.3 mmol/L (ref 0.5–1.9)

## 2024-02-13 LAB — RESP PANEL BY RT-PCR (RSV, FLU A&B, COVID)  RVPGX2
Influenza A by PCR: NEGATIVE
Influenza B by PCR: NEGATIVE
Resp Syncytial Virus by PCR: NEGATIVE
SARS Coronavirus 2 by RT PCR: NEGATIVE

## 2024-02-13 MED ORDER — LORAZEPAM 2 MG/ML IJ SOLN
1.0000 mg | Freq: Once | INTRAMUSCULAR | Status: AC
  Administered 2024-02-13: 1 mg via INTRAVENOUS
  Filled 2024-02-13: qty 1

## 2024-02-13 MED ORDER — VANCOMYCIN HCL 1500 MG/300ML IV SOLN
1500.0000 mg | Freq: Once | INTRAVENOUS | Status: AC
  Administered 2024-02-13: 1500 mg via INTRAVENOUS
  Filled 2024-02-13: qty 300

## 2024-02-13 MED ORDER — PIPERACILLIN-TAZOBACTAM IN DEX 2-0.25 GM/50ML IV SOLN
2.2500 g | Freq: Once | INTRAVENOUS | Status: AC
Start: 2024-02-13 — End: 2024-02-14
  Administered 2024-02-13: 2.25 g via INTRAVENOUS
  Filled 2024-02-13: qty 50

## 2024-02-13 MED ORDER — PIPERACILLIN SOD-TAZOBACTAM SO 2.25 (2-0.25) G IV SOLR
INTRAVENOUS | Status: AC
  Filled 2024-02-13: qty 10

## 2024-02-13 NOTE — ED Triage Notes (Signed)
 Pt states his back started hurting and he became weak while at dialysis. Pt could only tolerate one hour. Pt went home and became weaker so he called 911.

## 2024-02-13 NOTE — ED Notes (Signed)
 Pt ambulated. Pt set up on side of bed. Pt set there until O2 hit 98% and pulse was 84. Once instructed pt stood up but instead of standing staggered to door handle for balance even though I was supporting pt under his arm. Pt kept taking steps attempting to maintain balance. I was able to have Meagan RN help with assisting with pt's balance and she grabbed his other arm. Pt was walked back to bed and pt set down and assisted back into bed. Charm Barges MD notified.

## 2024-02-13 NOTE — Progress Notes (Signed)
 ED Pharmacy Antibiotic Sign Off An antibiotic consult was received from an ED provider for zosyn/vancomycin per pharmacy dosing for bacteremia. A chart review was completed to assess appropriateness.   The following one time order(s) were placed:   -Zosyn 2.25gm IV x1 -Vancomycin 1500 mg IV x1  Further antibiotic and/or antibiotic pharmacy consults should be ordered by the admitting provider if indicated.   Thank you for allowing pharmacy to be a part of this patient's care.   Arabella Merles, [facility]  Clinical Pharmacist 02/13/24 11:20 PM

## 2024-02-13 NOTE — ED Notes (Signed)
Pt is in MRI  

## 2024-02-13 NOTE — ED Provider Notes (Signed)
 [facility] AT [facility] Provider Note   CSN: [id number] Arrival date & time: 02/13/24  1555     History  No chief complaint on file.   Jacob Farmer is a 78 y.o. male.  He has a history of diabetes, anemia, CKD on dialysis.  He said he only could last about an hour today for dialysis when his back started aching.  He asked them to stop the dialysis and when he got up he was very weak and shaky in his legs.  They recommended he come to the emergency department for further evaluation.  He said he went home and rested in a recliner for a bit and his back is better but his legs were still weak.  Needed assistance getting out of a chair.  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  He is not felt sick, no cough or shortness of breath no vomiting or diarrhea.  No fevers.  He said he sometimes feels weak after dialysis and his legs are shaky the evening of dialysis  The history is provided by the patient.  Weakness Severity:  Moderate Onset quality:  Sudden Duration:  3 hours Timing:  Constant Progression:  Unchanged Chronicity:  Recurrent Context comment:  Dialysis Relieved by:  Nothing Worsened by:  Activity Ineffective treatments:  Rest Associated symptoms: difficulty walking   Associated symptoms: no abdominal pain, no chest pain, no cough, no diarrhea, no falls, no fever, no nausea, no shortness of breath, no syncope and no vomiting        Home Medications Prior to Admission medications   Medication Sig Start Date End Date Taking? Authorizing Provider  allopurinol (ZYLOPRIM) 100 MG tablet Take 50 mg by mouth every other day.    [provider]  amLODipine (NORVASC) 5 MG tablet Take 5 mg by mouth in the morning.    [provider]  apixaban (ELIQUIS) 2.5 MG TABS tablet Take 1 tablet (2.5 mg total) by mouth 2 (two) times daily. 01/09/24   Nobie Putnam, MD  aspirin EC 81 MG tablet Take 81 mg by mouth in the morning. Swallow whole.    [provider]  calcitRIOL (ROCALTROL) 0.25 MCG capsule Take 0.25 mcg by mouth every Monday, Wednesday, and Friday.    [provider]  Dupilumab (DUPIXENT) 300 MG/2ML SOAJ Inject 300 mg into the skin every 14 (fourteen) days. Starting at day 15 for maintenance. 01/08/24   Elie Goody, MD  folic acid (FOLVITE) 400 MCG tablet Take 400 mcg by mouth in the morning.    [provider]  leflunomide (ARAVA) 10 MG tablet Take 1 tablet (10 mg total) by mouth daily. 12/18/23   Gearldine Bienenstock, PA-C  Leuprolide Acetate (ELIGARD [place]) Inject 1 Dose into the skin every 4 (four) months.    [provider]  oxyCODONE-acetaminophen (PERCOCET) 5-325 MG tablet Take 1 tablet by mouth every 6 (six) hours as needed for severe pain (pain score 7-10). 01/27/24 01/26/25  Baglia, Corrina, PA-C  pantoprazole (PROTONIX) 40 MG tablet TAKE 1 TABLET TWICE DAILY 02/06/24   Marguerita Merles, Reuel Boom, MD  RENVELA 800 MG tablet Take 800 mg by mouth 2 (two) times daily with a meal. 08/05/23   [provider]  rosuvastatin (CRESTOR) 40 MG tablet Take 1 tablet (40 mg total) by mouth daily. Patient taking differently: Take 40 mg by mouth  every evening. 07/03/23   Strader, Lennart Pall, PA-C  triamcinolone cream (KENALOG) 0.1 % Apply 1 Application topically daily. Patient taking differently: Apply 1 Application topically daily as needed (irritation). 02/17/23   Cleora Fleet, MD      Allergies    Patient has no known allergies.    Review of Systems   Review of Systems  Constitutional:  Negative for fever.  Respiratory:  Negative for cough and shortness of breath.   Cardiovascular:  Negative for chest pain and syncope.  Gastrointestinal:  Negative for abdominal pain, diarrhea, nausea and vomiting.  Musculoskeletal:  Negative for falls.  Neurological:  Positive for weakness.    Physical  Exam Updated Vital Signs BP 135/67 (BP Location: Right Arm)   Pulse 73   Temp 97.7 F (36.5 C) (Oral)   Resp 18   Ht 5\' 10"  (1.778 m)   Wt 71.7 kg   SpO2 97%   BMI 22.67 kg/m  Physical Exam Vitals and nursing note reviewed.  Constitutional:      General: He is not in acute distress.    Appearance: Normal appearance. He is well-developed.  HENT:     Head: Normocephalic and atraumatic.  Eyes:     Conjunctiva/sclera: Conjunctivae normal.  Cardiovascular:     Rate and Rhythm: Normal rate and regular rhythm.     Heart sounds: No murmur heard. Pulmonary:     Effort: Pulmonary effort is normal. No respiratory distress.     Breath sounds: Normal breath sounds.     Comments: Has dialysis catheter in his right upper chest. Abdominal:     Palpations: Abdomen is soft.     Tenderness: There is no abdominal tenderness. There is no guarding or rebound.  Musculoskeletal:        General: No deformity.     Cervical back: Neck supple.  Skin:    General: Skin is warm and dry.     Capillary Refill: Capillary refill takes less than 2 seconds.  Neurological:     General: No focal deficit present.     Mental Status: He is alert.     Cranial Nerves: No cranial nerve deficit.     Sensory: No sensory deficit.     Motor: No weakness.     ED Results / Procedures / Treatments   Labs (all labs ordered are listed, but only abnormal results are displayed) Labs Reviewed  BASIC METABOLIC PANEL - Abnormal; Notable for the following components:      Result Value   Sodium 130 (*)    Chloride 93 (*)    BUN 35 (*)    Creatinine, Ser 5.43 (*)    Calcium 8.6 (*)    GFR, Estimated 10 (*)    All other components within normal limits  CBC WITH DIFFERENTIAL/PLATELET - Abnormal; Notable for the following components:   WBC 20.4 (*)    RBC 3.16 (*)    Hemoglobin 9.7 (*)    HCT 29.3 (*)    Neutro Abs 17.4 (*)    Lymphs Abs 0.5 (*)    Monocytes Absolute 2.4 (*)    Abs Immature Granulocytes 0.17 (*)     All other components within normal limits  URINALYSIS, W/ REFLEX TO CULTURE (INFECTION SUSPECTED) - Abnormal; Notable for the following components:   Glucose, UA 50 (*)    Protein, ur 100 (*)    Bacteria, UA RARE (*)    All other components within normal limits  COMPREHENSIVE METABOLIC PANEL - Abnormal; Notable  for the following components:   Sodium 132 (*)    Chloride 91 (*)    Glucose, Bld 100 (*)    BUN 49 (*)    Creatinine, Ser 6.53 (*)    Calcium 8.7 (*)    Total Protein 6.1 (*)    Albumin 2.9 (*)    GFR, Estimated 8 (*)    Anion gap 16 (*)    All other components within normal limits  CBC - Abnormal; Notable for the following components:   WBC 20.7 (*)    RBC 2.94 (*)    Hemoglobin 8.7 (*)    HCT 27.6 (*)    All other components within normal limits  PHOSPHORUS - Abnormal; Notable for the following components:   Phosphorus 5.0 (*)    All other components within normal limits  TROPONIN I (HIGH SENSITIVITY) - Abnormal; Notable for the following components:   Troponin I (High Sensitivity) 33 (*)    All other components within normal limits  TROPONIN I (HIGH SENSITIVITY) - Abnormal; Notable for the following components:   Troponin I (High Sensitivity) 38 (*)    All other components within normal limits  TROPONIN I (HIGH SENSITIVITY) - Abnormal; Notable for the following components:   Troponin I (High Sensitivity) 53 (*)    All other components within normal limits  TROPONIN I (HIGH SENSITIVITY) - Abnormal; Notable for the following components:   Troponin I (High Sensitivity) 54 (*)    All other components within normal limits  RESP PANEL BY RT-PCR (RSV, FLU A&B, COVID)  RVPGX2  CULTURE, BLOOD (ROUTINE X 2)  CULTURE, BLOOD (ROUTINE X 2)  LACTIC ACID, PLASMA  LACTIC ACID, PLASMA  PROCALCITONIN  MAGNESIUM  OSMOLALITY  OSMOLALITY, URINE  SODIUM, URINE, RANDOM    EKG EKG Interpretation Date/Time:  Friday February 13 2024 16:55:33 EST Ventricular Rate:  97 PR  Interval:  213 QRS Duration:  152 QT Interval:  387 QTC Calculation: 492 R Axis:   -78  Text Interpretation: Sinus or ectopic atrial rhythm Borderline prolonged PR interval Right bundle branch block Anterior infarct, age indeterminate COPY Confirmed by Meridee Score [phone]) on 02/13/2024 5:03:17 PM  Radiology MR BRAIN WO CONTRAST Result Date: 02/13/2024 CLINICAL DATA:  Initial evaluation for acute headache, neuro deficit, dizziness. EXAM: MRI HEAD WITHOUT CONTRAST TECHNIQUE: Multiplanar, multiecho pulse sequences of the brain and surrounding structures were obtained without intravenous contrast. COMPARISON:  CT from 09/13/2023 and MRI from 09/12/2023. FINDINGS: Brain: Mild age-related cerebral atrophy. Patchy T2/FLAIR hyperintensity involving the periventricular deep white matter both cerebral hemispheres, consistent with chronic small vessel ischemic disease, mild in nature. Susceptibility artifact with mild encephalomalacia and gliosis involving the posterior right frontoparietal region, consistent with prior hemorrhage seen at this location. Small focus of chronic hemosiderin staining at the left occipital lobe noted, stable. No evidence for acute or subacute infarct. No other acute or chronic intracranial blood products. No mass lesion, midline shift or mass effect. No hydrocephalus or extra-axial fluid collection. Pituitary gland and suprasellar region within normal limits. Vascular: Irregular flow void within the left V3 segment as it courses into the cranial vault, stable from prior. Major intracranial vascular flow voids are otherwise maintained. Skull and upper cervical spine: Craniocervical junction within normal limits. Bone marrow signal intensity heterogeneous without worrisome osseous lesion. No scalp soft tissue abnormality. 2.8 cm T1 hypointense lesion partially visualized within the left upper posterior neck (series 9, image 22), indeterminate. Sinuses/Orbits: Prior bilateral ocular lens  replacement. Paranasal sinuses are largely clear.  Trace bilateral mastoid effusions, of doubtful significance. Other: None. IMPRESSION: 1. No acute intracranial abnormality. 2. Sequelae of prior hemorrhage involving the posterior right frontoparietal region. 3. Underlying mild age-related cerebral atrophy with chronic small vessel ischemic disease. 4. 2.8 cm T1 hypointense lesion partially visualized within the left upper posterior neck, indeterminate. Correlation with physical exam recommended. Electronically Signed   By: Rise Mu M.D.   On: 02/13/2024 21:50   DG Chest Port 1 View Result Date: 02/13/2024 CLINICAL DATA:  Weakness and back pain at dialysis today. EXAM: PORTABLE CHEST 1 VIEW COMPARISON:  Cardiac CT 12/18/2023. Chest radiographs 02/15/2023 and 02/13/2023. FINDINGS: 1629 hours. Right IJ hemodialysis catheter projects to the mid right atrium. The heart size and mediastinal contours are stable. The lungs are clear. No pleural effusion or pneumothorax. The bones appear unremarkable. Telemetry leads overlie the chest. IMPRESSION: No evidence of acute cardiopulmonary process. Right IJ hemodialysis catheter in place. Electronically Signed   By: Carey Bullocks M.D.   On: 02/13/2024 16:52    Procedures Procedures    Medications Ordered in ED Medications  acetaminophen (TYLENOL) tablet 650 mg (has no administration in time range)    Or  acetaminophen (TYLENOL) suppository 650 mg (has no administration in time range)  ondansetron (ZOFRAN) tablet 4 mg (has no administration in time range)    Or  ondansetron (ZOFRAN) injection 4 mg (has no administration in time range)  amLODipine (NORVASC) tablet 5 mg (5 mg Oral Given 02/14/24 0809)  rosuvastatin (CRESTOR) tablet 40 mg (has no administration in time range)  calcitRIOL (ROCALTROL) capsule 0.25 mcg (has no administration in time range)  pantoprazole (PROTONIX) EC tablet 40 mg (40 mg Oral Given 02/14/24 0809)  sevelamer carbonate  (RENVELA) tablet 800 mg (800 mg Oral Given 02/14/24 0808)  folic acid (FOLVITE) tablet 0.5 mg (0.5 mg Oral Given 02/14/24 0808)  vancomycin variable dose per unstable renal function (pharmacist dosing) (has no administration in time range)  apixaban (ELIQUIS) tablet 2.5 mg (2.5 mg Oral Given 02/14/24 0809)  aspirin EC tablet 81 mg (81 mg Oral Given 02/14/24 0808)  sodium chloride flush (NS) 0.9 % injection 10-40 mL (10 mLs Intracatheter Given 02/14/24 0811)  sodium chloride flush (NS) 0.9 % injection 10-40 mL (has no administration in time range)  Chlorhexidine Gluconate Cloth 2 % PADS 6 each (6 each Topical Given 02/14/24 0811)  piperacillin-tazobactam (ZOSYN) IVPB 2.25 g (has no administration in time range)  LORazepam (ATIVAN) injection 1 mg (1 mg Intravenous Given 02/13/24 1956)  piperacillin-tazobactam (ZOSYN) IVPB 2.25 g (0 g Intravenous Stopped 02/14/24 0028)  vancomycin (VANCOREADY) IVPB 1500 mg/300 mL (0 mg Intravenous Stopped 02/14/24 0130)    ED Course/ Medical Decision Making/ A&P Clinical Course as of 02/13/24 1851  Fri Feb 13, 2024  1645 Chest x-ray interpreted by me as no acute infiltrate.  Awaiting radiology reading. [MB]  1850 Tried to walk patient but he was very unsteady when we got him up. [MB]    Clinical Course User Index [MB] Terrilee Files, MD                                 Medical Decision Making Amount and/or Complexity of Data Reviewed Labs: ordered. Radiology: ordered.  Risk Prescription drug management. Decision regarding hospitalization.   This patient complains of general weakness, fever; this involves an extensive number of treatment Options and is a complaint that carries with it a high risk  of complications and morbidity. The differential includes sepsis, Sirs, bacteremia, stroke, metabolic derangement  I ordered, reviewed and interpreted labs, which included CBC with elevated white count, low stable hemoglobin, chemistries consistent with CKD, urinalysis  without signs of infection, lactate normal, troponins elevated but flat, blood culture sent I ordered medication IV antibiotics and reviewed PMP when indicated. I ordered imaging studies which included chest x-ray and MRI brain and I independently    visualized and interpreted imaging which showed no acute findings Additional history obtained from patient's wife Previous records obtained and reviewed in epic including recent operative note I consulted [facility] hospitalist Dr. Thomes Dinning and discussed lab and imaging findings and discussed disposition.  Cardiac monitoring reviewed, normal sinus rhythm Social determinants considered, no significant barriers Critical Interventions: None  After the interventions stated above, I reevaluated the patient and found patient to be feeling a little bit better although still symptomatic when try to ambulate Admission and further testing considered, due to his SIRS criteria, risk for bacteremia with a central line and dialysis patient would benefit from admission to the hospital for surveillance of his blood cultures and antibiotics.  Patient is agreement with plan for admission.         Final Clinical Impression(s) / ED Diagnoses Final diagnoses:  Generalized weakness  SIRS (systemic inflammatory response syndrome) (HCC)  End stage renal disease on dialysis [facility])    Rx / DC Orders ED Discharge Orders     None         Terrilee Files, MD 02/14/24 [phone]

## 2024-02-13 NOTE — H&P (Addendum)
 History and Physical    Patient: Jacob Farmer UEA:[id number] DOB: 01-29-1946 DOA: 02/13/2024 DOS: the patient was seen and examined on 02/14/2024 PCP: Anabel Halon, MD  Patient coming from: Home  Chief Complaint:  Chief Complaint  Patient presents with   Weakness   HPI: FINNBAR Farmer is a 78 y.o. male with medical history significant of hypertension, hyperlipidemia, MI, ESRD on HD (MWF), paroxysmal atrial fibrillation not on anticoagulation due to prior history of GI bleed who presents to the emergency department due to back pain which occurred during dialysis today.  Patient was only able to have 1 hour session of dialysis and he also complained of generalized weakness and shaky legs and was asked to go to the ED for further evaluation, instead, patient went home and rested on his recliner, he initially felt better, but legs were still weak and needed assistance to get out of chair, so he decided to go to the ED for further evaluation and management.  ED Course:  In the emergency department, respiratory rate was 24, other vital signs were within normal range.  Workup in the ED showed WBC of 20.4 hemoglobin 9.7, hematocrit 29.7, MCV 92.7, platelets 167.  BMP 130, potassium 3.5, chloride 93, bicarb 27, glucose , BUN 35, creatinine 5.43, GFR 10.  Troponin 33 > 38.  Urinalysis was unimpressive for UTI, lactic acid was normal..  Influenza A, B, SARS coronavirus 2, RSV was negative. MRI brain without contrast showed no acute intracranial abnormality Chest x-ray showed no evidence of acute cardiopulmonary process Patient was empirically treated with IV Zosyn for presumed bacteremia Hospitalist was asked admit patient for further evaluation and management.  Review of Systems: Review of systems as noted in the HPI. All other systems reviewed and are negative.   Past Medical History:  Diagnosis Date   Acute CVA (cerebrovascular accident) (HCC) 09/11/2023   CAD (coronary artery disease)     Acute myocardial infarction treated with TPA in 1990; 1999-stents to circumflex and RCA; residual total occlusion of the left anterior descending; normal ejection fraction; stress nuclear in 2001-distal anteroseptal ischemia; normal LV function   CKD (chronic kidney disease) stage 4, GFR 15-29 ml/min (HCC)    First degree heart block    History of gastric ulcer    History of kidney stones    History of MI (myocardial infarction)    1990-  treated w/ TPA   Hyperlipidemia    Hypertension    Jaundice    OA (osteoarthritis)    Prediabetes    Prostate cancer (HCC)    Stage T1c , Gleason 3+4,  PSA 4.7,  vol 24cc--  scheduled for radiative seed implants   Psoriatic arthritis (HCC)    RA (rheumatoid arthritis) (HCC)    Dr. Beatrix Fetters   Sinus bradycardia    Wears dentures    Past Surgical History:  Procedure Laterality Date   AV FISTULA PLACEMENT Left 01/27/2024   Procedure: CREATION OF LEFT ARM ARTERIOVENOUS (AV) FISTULA;  Surgeon: Maeola Harman, MD;  Location[facility] OR;  Service: Vascular;  Laterality: Left;   BIOPSY  08/27/2019   Procedure: BIOPSY;  Surgeon: Malissa Hippo, MD;  [facility];  Service: Endoscopy;;  gastric   BIOPSY  04/03/2022   Procedure: BIOPSY;  Surgeon: Dolores Frame, MD;  [facility];  Service: Gastroenterology;;   BIOPSY  04/21/2023   Procedure: BIOPSY;  Surgeon: Lanelle Bal, DO;  [facility];  Service: Endoscopy;;  callus removal Left 09/13/2022   left foot   CARDIOVASCULAR STRESS TEST  2001  per Dr Dietrich Pates clinic note   distal anteroseptal ischemia,  normal LVF   COLONOSCOPY  last one 2006 (approx)   COLONOSCOPY WITH PROPOFOL N/A 04/03/2022   Procedure: COLONOSCOPY WITH PROPOFOL;  Surgeon: Dolores Frame, MD;  [facility]: [facility];  Service: Gastroenterology;  Laterality: N/A;  945   CORONARY ANGIOPLASTY WITH STENT PLACEMENT  1999   DES x2  to CFX and RCA/  residual total occlusion  LAD,  normal LVEF   ENTEROSCOPY N/A 04/24/2023   Procedure: ENTEROSCOPY;  Surgeon: Dolores Frame, MD;  [facility];  Service: Gastroenterology;  Laterality: N/A;   ESOPHAGOGASTRODUODENOSCOPY (EGD) WITH PROPOFOL N/A 08/27/2019   Procedure: ESOPHAGOGASTRODUODENOSCOPY (EGD) WITH PROPOFOL;  Surgeon: Malissa Hippo, MD;  [facility];  Service: Endoscopy;  Laterality: N/A;  10:10   ESOPHAGOGASTRODUODENOSCOPY (EGD) WITH PROPOFOL N/A 04/03/2022   Procedure: ESOPHAGOGASTRODUODENOSCOPY (EGD) WITH PROPOFOL;  Surgeon: Dolores Frame, MD;  [facility];  Service: Gastroenterology;  Laterality: N/A;   ESOPHAGOGASTRODUODENOSCOPY (EGD) WITH PROPOFOL N/A 02/16/2023   Procedure: ESOPHAGOGASTRODUODENOSCOPY (EGD) WITH PROPOFOL;  Surgeon: Dolores Frame, MD;  [facility];  Service: Gastroenterology;  Laterality: N/A;   ESOPHAGOGASTRODUODENOSCOPY (EGD) WITH PROPOFOL N/A 04/21/2023   Procedure: ESOPHAGOGASTRODUODENOSCOPY (EGD) WITH PROPOFOL;  Surgeon: Lanelle Bal, DO;  [facility];  Service: Endoscopy;  Laterality: N/A;   EYE SURGERY Bilateral    cataract   GIVENS CAPSULE STUDY N/A 04/22/2023   Procedure: GIVENS CAPSULE STUDY;  Surgeon: Dolores Frame, MD;  [facility];  Service: Gastroenterology;  Laterality: N/A;   HEMOSTASIS CLIP PLACEMENT  04/24/2023   Procedure: HEMOSTASIS CLIP PLACEMENT;  Surgeon: Dolores Frame, MD;  [facility];  Service: Gastroenterology;;   HOT HEMOSTASIS  04/03/2022   Procedure: HOT HEMOSTASIS (ARGON PLASMA COAGULATION/BICAP);  Surgeon: Marguerita Merles, Reuel Boom, MD;  [facility];  Service: Gastroenterology;;   HOT HEMOSTASIS  04/24/2023   Procedure: HOT HEMOSTASIS (ARGON PLASMA COAGULATION/BICAP);  Surgeon: Marguerita Merles, Reuel Boom, MD;  [facility];  Service: Gastroenterology;;   IR FLUORO GUIDE CV LINE RIGHT  05/30/2023   IR US  GUIDE VASC ACCESS RIGHT  05/30/2023   POLYPECTOMY  04/03/2022   Procedure: POLYPECTOMY;  Surgeon: Dolores Frame, MD;  [facility];  Service: Gastroenterology;;   POSTERIOR CERVICAL FUSION/FORAMINOTOMY N/A 05/26/2017   Procedure: RIGHT C4-5 FORAMINOTOMY WITH EXCISION OF HERNIATED DISC;  Surgeon: Kerrin Champagne, MD;  Location: [facility] OR;  Service: Orthopedics;  Laterality: N/A;   RADIOACTIVE SEED IMPLANT N/A 01/11/2016   Procedure: RADIOACTIVE SEED IMPLANT/BRACHYTHERAPY IMPLANT;  Surgeon: Marcine Matar, MD;  Location: [facility];  Service: Urology;  Laterality: N/A;   73  seeds implanted no seeds founds in bladder   SCLEROTHERAPY  04/24/2023   Procedure: SCLEROTHERAPY;  Surgeon: Marguerita Merles, Reuel Boom, MD;  [facility];  Service: Gastroenterology;;   SUBMUCOSAL TATTOO INJECTION  04/24/2023   Procedure: SUBMUCOSAL TATTOO INJECTION;  Surgeon: Dolores Frame, MD;  [facility];  Service: Gastroenterology;;    Social History:  reports that he quit smoking about 34 years ago. His smoking use included cigarettes. He started smoking about 64 years ago. He has a 30 pack-year smoking history. He has never been exposed to tobacco smoke. He has never used smokeless tobacco. He reports current alcohol use of about 3.0  standard drinks of alcohol per week. He reports that he does not use drugs.   No Known Allergies  Family History  Problem Relation Age of Onset   Heart attack Mother    Coronary artery disease Father    Ulcerative colitis Father    Ulcers Father    Breast cancer Sister    COPD Brother    Pancreatic cancer Brother    Healthy Sister    Diabetes Brother    Cirrhosis Son    Seizures Son      Prior to Admission medications   Medication Sig Start Date End Date Taking? Authorizing Provider  allopurinol (ZYLOPRIM) 100 MG tablet Take 50 mg by mouth every other day.   Yes [provider]  amLODipine (NORVASC)  5 MG tablet Take 5 mg by mouth in the morning.   Yes [provider]  apixaban (ELIQUIS) 2.5 MG TABS tablet Take 1 tablet (2.5 mg total) by mouth 2 (two) times daily. 01/09/24  Yes Nobie Putnam, MD  aspirin EC 81 MG tablet Take 81 mg by mouth in the morning. Swallow whole.   Yes [provider]  calcitRIOL (ROCALTROL) 0.25 MCG capsule Take 0.25 mcg by mouth every Monday, Wednesday, and Friday. At dialysis   Yes [provider]  Dupilumab (DUPIXENT) 300 MG/2ML SOAJ Inject 300 mg into the skin every 14 (fourteen) days. Starting at day 15 for maintenance. 01/08/24  Yes Elie Goody, MD  folic acid (FOLVITE) 400 MCG tablet Take 400 mcg by mouth in the morning.   Yes [provider]  leflunomide (ARAVA) 10 MG tablet Take 1 tablet (10 mg total) by mouth daily. 12/18/23  Yes Gearldine Bienenstock, PA-C  Leuprolide Acetate (ELIGARD [place]) Inject 1 Dose into the skin every 4 (four) months.   Yes [provider]  pantoprazole (PROTONIX) 40 MG tablet TAKE 1 TABLET TWICE DAILY 02/06/24  Yes Marguerita Merles, Reuel Boom, MD  RENVELA 800 MG tablet Take 800 mg by mouth 2 (two) times daily with a meal. 08/05/23  Yes [provider]  rosuvastatin (CRESTOR) 40 MG tablet Take 1 tablet (40 mg total) by mouth daily. Patient taking differently: Take 40 mg by mouth every evening. 07/03/23  Yes Strader, [place] M, PA-C  triamcinolone cream (KENALOG) 0.1 % Apply 1 Application topically daily. Patient taking differently: Apply 1 Application topically daily as needed (irritation). 02/17/23  Yes Cleora Fleet, MD    Physical Exam: BP 134/60   Pulse 79   Temp 98 F (36.7 C) (Oral)   Resp 18   Ht 5\' 10"  (1.778 m)   Wt 71.7 kg   SpO2 96%   BMI 22.67 kg/m   General: 78 y.o. year-old male well developed well nourished in no acute distress.  Alert and oriented x3. HEENT: NCAT, EOMI Neck: Supple, trachea medial Cardiovascular: Regular rate and rhythm with no rubs or  gallops.  No thyromegaly or JVD noted.  No lower extremity edema. 2/4 pulses in all 4 extremities. Respiratory: Noted dialysis catheter in right upper chest.   Abdomen: Soft, nontender nondistended with normal bowel sounds x4 quadrants. Muskuloskeletal: No cyanosis, clubbing or edema noted bilaterally Neuro: CN II-XII intact, sensation, reflexes intact Skin: No ulcerative lesions noted or rashes Psychiatry: Judgement and insight appear normal. Mood is appropriate for condition and setting          Labs on Admission:  Basic Metabolic Panel: Recent Labs  Lab 02/13/24 1637  NA 130*  K 3.5  CL 93*  CO2 27  GLUCOSE 99  BUN 35*  CREATININE 5.43*  CALCIUM 8.6*   Liver Function Tests: No results for input(s): "AST", "ALT", "ALKPHOS", "BILITOT", "PROT", "ALBUMIN" in the last 168 hours. No results for input(s): "LIPASE", "AMYLASE" in the last 168 hours. No results for input(s): "AMMONIA" in the last 168 hours. CBC: Recent Labs  Lab 02/13/24 1637  WBC 20.4*  NEUTROABS 17.4*  HGB 9.7*  HCT 29.3*  MCV 92.7  PLT 167   Cardiac Enzymes: No results for input(s): "CKTOTAL", "CKMB", "CKMBINDEX", "TROPONINI" in the last 168 hours.  BNP (last 3 results) No results for input(s): "BNP" in the last 8760 hours.  ProBNP (last 3 results) No results for input(s): "PROBNP" in the last 8760 hours.  CBG: No results for input(s): "GLUCAP" in the last 168 hours.  Radiological Exams on Admission: MR BRAIN WO CONTRAST Result Date: 02/13/2024 CLINICAL DATA:  Initial evaluation for acute headache, neuro deficit, dizziness. EXAM: MRI HEAD WITHOUT CONTRAST TECHNIQUE: Multiplanar, multiecho pulse sequences of the brain and surrounding structures were obtained without intravenous contrast. COMPARISON:  CT from 09/13/2023 and MRI from 09/12/2023. FINDINGS: Brain: Mild age-related cerebral atrophy. Patchy T2/FLAIR hyperintensity involving the periventricular deep white matter both cerebral hemispheres,  consistent with chronic small vessel ischemic disease, mild in nature. Susceptibility artifact with mild encephalomalacia and gliosis involving the posterior right frontoparietal region, consistent with prior hemorrhage seen at this location. Small focus of chronic hemosiderin staining at the left occipital lobe noted, stable. No evidence for acute or subacute infarct. No other acute or chronic intracranial blood products. No mass lesion, midline shift or mass effect. No hydrocephalus or extra-axial fluid collection. Pituitary gland and suprasellar region within normal limits. Vascular: Irregular flow void within the left V3 segment as it courses into the cranial vault, stable from prior. Major intracranial vascular flow voids are otherwise maintained. Skull and upper cervical spine: Craniocervical junction within normal limits. Bone marrow signal intensity heterogeneous without worrisome osseous lesion. No scalp soft tissue abnormality. 2.8 cm T1 hypointense lesion partially visualized within the left upper posterior neck (series 9, image 22), indeterminate. Sinuses/Orbits: Prior bilateral ocular lens replacement. Paranasal sinuses are largely clear. Trace bilateral mastoid effusions, of doubtful significance. Other: None. IMPRESSION: 1. No acute intracranial abnormality. 2. Sequelae of prior hemorrhage involving the posterior right frontoparietal region. 3. Underlying mild age-related cerebral atrophy with chronic small vessel ischemic disease. 4. 2.8 cm T1 hypointense lesion partially visualized within the left upper posterior neck, indeterminate. Correlation with physical exam recommended. Electronically Signed   By: Rise Mu M.D.   On: 02/13/2024 21:50   DG Chest Port 1 View Result Date: 02/13/2024 CLINICAL DATA:  Weakness and back pain at dialysis today. EXAM: PORTABLE CHEST 1 VIEW COMPARISON:  Cardiac CT 12/18/2023. Chest radiographs 02/15/2023 and 02/13/2023. FINDINGS: 1629 hours. Right IJ  hemodialysis catheter projects to the mid right atrium. The heart size and mediastinal contours are stable. The lungs are clear. No pleural effusion or pneumothorax. The bones appear unremarkable. Telemetry leads overlie the chest. IMPRESSION: No evidence of acute cardiopulmonary process. Right IJ hemodialysis catheter in place. Electronically Signed   By: Carey Bullocks M.D.   On: 02/13/2024 16:52    EKG: I independently viewed the EKG done and my findings are as followed: Sinus/ectopic atrial rhythm at a rate of 97 bpm with RBBB  Assessment/Plan Present on Admission:  Essential hypertension  Mixed hyperlipidemia  Paroxysmal atrial fibrillation (HCC)  Principal Problem:   SIRS (systemic inflammatory response syndrome) (HCC)  Active Problems:   Mixed hyperlipidemia   Essential hypertension   Paroxysmal atrial fibrillation (HCC)   Generalized weakness   ESRD on dialysis (HCC)   History of stroke   Hyponatremia   Elevated troponin   GERD (gastroesophageal reflux disease)  SIRS with suspicion for an infectious process Patient presents with leukocytosis and tachypnea He was empirically started on IV vancomycin and Zosyn with plan to de-escalate/discontinue based on blood culture and procalcitonin ( will be checked).  Hyponatremia Na 130, this will be corrected during dialysis Continue to monitor sodium with serial BMPs Urine osmolality, serum osmolality and urine sodium will be checked   ESRD on HD (MWF) Patient only had 1 hour session today due to back pain Continue Tylenol as needed Continue [facility] Ne[facility]rology will be consulted for maintenance dialysis  Generalized weakness Back pain Continue Tylenol as needed Continue fall precaution Continue PT/OT eval and treat  Elevated troponin possibly secondary to type II demand ischemia Troponin 33 > 38, patient denies any chest pain.  No EKG changes Continue to trend troponin  Essential hypertension Continue  amlodipine  Mixed hyperlipidemia Continue statin  GERD Continue Protonix  Paroxysmal atrial fibrillation Continue Eliquis.  No rate control medication noted on med rec  History of stroke Continue aspirin, statin  DVT prophylaxis: Eliquis  Code Status: Full code  Family Communication: Wife at bedside (all questions answered to satisfaction)  Consults: Nephrology  Severity of Illness: The appropriate patient status for this patient is INPATIENT. Inpatient status is judged to be reasonable and necessary in order to provide the required intensity of service to ensure the patient's safety. The patient's presenting symptoms, physical exam findings, and initial radiographic and laboratory data in the context of their chronic comorbidities is felt to place them at high risk for further clinical deterioration. Furthermore, it is not anticipated that the patient will be medically stable for discharge from the hospital within 2 midnights of admission.   * I certify that at the point of admission it is my clinical judgment that the patient will require inpatient hospital care spanning beyond 2 midnights from the point of admission due to high intensity of service, high risk for further deterioration and high frequency of surveillance required.*  Author: Frankey Shown, DO 02/14/2024 4:44 AM  For on call review [email or web address].

## 2024-02-13 NOTE — ED Notes (Signed)
 This tech ambulated the pt. He was steady on his feet and said that he felt stronger than he did when he arrived.

## 2024-02-14 ENCOUNTER — Inpatient Hospital Stay (HOSPITAL_COMMUNITY)

## 2024-02-14 ENCOUNTER — Other Ambulatory Visit: Payer: Self-pay

## 2024-02-14 ENCOUNTER — Encounter (HOSPITAL_COMMUNITY): Payer: Self-pay | Admitting: Internal Medicine

## 2024-02-14 DIAGNOSIS — E871 Hypo-osmolality and hyponatremia: Secondary | ICD-10-CM | POA: Diagnosis not present

## 2024-02-14 DIAGNOSIS — R6510 Systemic inflammatory response syndrome (SIRS) of non-infectious origin without acute organ dysfunction: Secondary | ICD-10-CM

## 2024-02-14 DIAGNOSIS — Z8673 Personal history of transient ischemic attack (TIA), and cerebral infarction without residual deficits: Secondary | ICD-10-CM

## 2024-02-14 DIAGNOSIS — N186 End stage renal disease: Secondary | ICD-10-CM | POA: Diagnosis not present

## 2024-02-14 DIAGNOSIS — K219 Gastro-esophageal reflux disease without esophagitis: Secondary | ICD-10-CM | POA: Insufficient documentation

## 2024-02-14 DIAGNOSIS — R7989 Other specified abnormal findings of blood chemistry: Secondary | ICD-10-CM | POA: Insufficient documentation

## 2024-02-14 DIAGNOSIS — Z992 Dependence on renal dialysis: Secondary | ICD-10-CM | POA: Diagnosis not present

## 2024-02-14 DIAGNOSIS — R531 Weakness: Secondary | ICD-10-CM | POA: Diagnosis not present

## 2024-02-14 LAB — COMPREHENSIVE METABOLIC PANEL
ALT: 16 U/L (ref 0–44)
AST: 29 U/L (ref 15–41)
Albumin: 2.9 g/dL — ABNORMAL LOW (ref 3.5–5.0)
Alkaline Phosphatase: 77 U/L (ref 38–126)
Anion gap: 16 — ABNORMAL HIGH (ref 5–15)
BUN: 49 mg/dL — ABNORMAL HIGH (ref 8–23)
CO2: 25 mmol/L (ref 22–32)
Calcium: 8.7 mg/dL — ABNORMAL LOW (ref 8.9–10.3)
Chloride: 91 mmol/L — ABNORMAL LOW (ref 98–111)
Creatinine, Ser: 6.53 mg/dL — ABNORMAL HIGH (ref 0.61–1.24)
GFR, Estimated: 8 mL/min — ABNORMAL LOW (ref >60)
Glucose, Bld: 100 mg/dL — ABNORMAL HIGH (ref 70–99)
Potassium: 3.9 mmol/L (ref 3.5–5.1)
Sodium: 132 mmol/L — ABNORMAL LOW (ref 135–145)
Total Bilirubin: 0.8 mg/dL (ref 0.0–1.2)
Total Protein: 6.1 g/dL — ABNORMAL LOW (ref 6.5–8.1)

## 2024-02-14 LAB — TROPONIN I (HIGH SENSITIVITY)
Troponin I (High Sensitivity): 53 ng/L — ABNORMAL HIGH (ref <18)
Troponin I (High Sensitivity): 54 ng/L — ABNORMAL HIGH (ref <18)

## 2024-02-14 LAB — CBC
HCT: 27.6 % — ABNORMAL LOW (ref 39.0–52.0)
Hemoglobin: 8.7 g/dL — ABNORMAL LOW (ref 13.0–17.0)
MCH: 29.6 pg (ref 26.0–34.0)
MCHC: 31.5 g/dL (ref 30.0–36.0)
MCV: 93.9 fL (ref 80.0–100.0)
Platelets: 150 10*3/uL (ref 150–400)
RBC: 2.94 MIL/uL — ABNORMAL LOW (ref 4.22–5.81)
RDW: 15.2 % (ref 11.5–15.5)
WBC: 20.7 10*3/uL — ABNORMAL HIGH (ref 4.0–10.5)
nRBC: 0 % (ref 0.0–0.2)

## 2024-02-14 LAB — PROCALCITONIN: Procalcitonin: 33.29 ng/mL

## 2024-02-14 LAB — OSMOLALITY: Osmolality: 294 mosm/kg (ref 275–295)

## 2024-02-14 LAB — MAGNESIUM: Magnesium: 2.1 mg/dL (ref 1.7–2.4)

## 2024-02-14 LAB — PHOSPHORUS: Phosphorus: 5 mg/dL — ABNORMAL HIGH (ref 2.5–4.6)

## 2024-02-14 MED ORDER — PIPERACILLIN-TAZOBACTAM IN DEX 2-0.25 GM/50ML IV SOLN
2.2500 g | Freq: Three times a day (TID) | INTRAVENOUS | Status: DC
  Filled 2024-02-14 (×5): qty 50

## 2024-02-14 MED ORDER — ONDANSETRON HCL 4 MG PO TABS: 4.0000 mg | ORAL_TABLET | Freq: Four times a day (QID) | ORAL | Status: DC | PRN

## 2024-02-14 MED ORDER — PANTOPRAZOLE SODIUM 40 MG PO TBEC
40.0000 mg | DELAYED_RELEASE_TABLET | Freq: Two times a day (BID) | ORAL | Status: DC
Start: 2024-02-14 — End: 2024-02-17
  Administered 2024-02-14 – 2024-02-17 (×7): 40 mg via ORAL
  Filled 2024-02-14 (×7): qty 1

## 2024-02-14 MED ORDER — VANCOMYCIN HCL 750 MG/150ML IV SOLN: 750.0000 mg | INTRAVENOUS | Status: DC

## 2024-02-14 MED ORDER — SODIUM CHLORIDE 0.9 % IV SOLN
2.2500 g | Freq: Three times a day (TID) | INTRAVENOUS | Status: DC
  Administered 2024-02-14 – 2024-02-16 (×6): 2.25 g via INTRAVENOUS
  Filled 2024-02-14: qty 10
  Filled 2024-02-14: qty 2.25
  Filled 2024-02-14 (×9): qty 10

## 2024-02-14 MED ORDER — PIPERACILLIN-TAZOBACTAM IN DEX 2-0.25 GM/50ML IV SOLN
2.2500 g | Freq: Three times a day (TID) | INTRAVENOUS | Status: DC
  Filled 2024-02-14 (×4): qty 50

## 2024-02-14 MED ORDER — FOLIC ACID 1 MG PO TABS
500.0000 ug | ORAL_TABLET | Freq: Every day | ORAL | Status: DC
  Administered 2024-02-14 – 2024-02-17 (×4): 0.5 mg via ORAL
  Filled 2024-02-14 (×4): qty 1

## 2024-02-14 MED ORDER — AMLODIPINE BESYLATE 5 MG PO TABS
5.0000 mg | ORAL_TABLET | Freq: Every day | ORAL | Status: DC
  Administered 2024-02-14 – 2024-02-17 (×4): 5 mg via ORAL
  Filled 2024-02-14 (×4): qty 1

## 2024-02-14 MED ORDER — ACETAMINOPHEN 650 MG RE SUPP: 650.0000 mg | Freq: Four times a day (QID) | RECTAL | Status: DC | PRN

## 2024-02-14 MED ORDER — APIXABAN 2.5 MG PO TABS
2.5000 mg | ORAL_TABLET | Freq: Two times a day (BID) | ORAL | Status: DC
  Administered 2024-02-14 – 2024-02-17 (×7): 2.5 mg via ORAL
  Filled 2024-02-14 (×7): qty 1

## 2024-02-14 MED ORDER — ONDANSETRON HCL 4 MG/2ML IJ SOLN: 4.0000 mg | Freq: Four times a day (QID) | INTRAMUSCULAR | Status: DC | PRN

## 2024-02-14 MED ORDER — VANCOMYCIN VARIABLE DOSE PER UNSTABLE RENAL FUNCTION (PHARMACIST DOSING): Status: DC

## 2024-02-14 MED ORDER — SODIUM CHLORIDE 0.9% FLUSH
10.0000 mL | Freq: Two times a day (BID) | INTRAVENOUS | Status: DC
  Administered 2024-02-14 – 2024-02-17 (×6): 10 mL

## 2024-02-14 MED ORDER — ROSUVASTATIN CALCIUM 20 MG PO TABS
40.0000 mg | ORAL_TABLET | Freq: Every day | ORAL | Status: DC
  Administered 2024-02-14 – 2024-02-16 (×3): 40 mg via ORAL
  Filled 2024-02-14 (×3): qty 2

## 2024-02-14 MED ORDER — SEVELAMER CARBONATE 800 MG PO TABS
800.0000 mg | ORAL_TABLET | Freq: Two times a day (BID) | ORAL | Status: DC
  Administered 2024-02-14 – 2024-02-17 (×7): 800 mg via ORAL
  Filled 2024-02-14 (×7): qty 1

## 2024-02-14 MED ORDER — SODIUM CHLORIDE 0.9% FLUSH: 10.0000 mL | INTRAVENOUS | Status: DC | PRN

## 2024-02-14 MED ORDER — ASPIRIN 81 MG PO TBEC
81.0000 mg | DELAYED_RELEASE_TABLET | Freq: Every day | ORAL | Status: DC
  Administered 2024-02-14 – 2024-02-17 (×4): 81 mg via ORAL
  Filled 2024-02-14 (×4): qty 1

## 2024-02-14 MED ORDER — CHLORHEXIDINE GLUCONATE CLOTH 2 % EX PADS
6.0000 | MEDICATED_PAD | Freq: Every day | CUTANEOUS | Status: DC
  Administered 2024-02-14 – 2024-02-17 (×4): 6 via TOPICAL

## 2024-02-14 MED ORDER — CALCITRIOL 0.25 MCG PO CAPS
0.2500 ug | ORAL_CAPSULE | ORAL | Status: DC
  Administered 2024-02-16: 0.25 ug via ORAL
  Filled 2024-02-14: qty 1

## 2024-02-14 MED ORDER — ACETAMINOPHEN 325 MG PO TABS: 650.0000 mg | ORAL_TABLET | Freq: Four times a day (QID) | ORAL | Status: DC | PRN

## 2024-02-14 NOTE — Progress Notes (Signed)
 Progress Note   Patient: Jacob Farmer WRU:[id number] DOB: 1946/01/08 DOA: 02/13/2024     1 DOS: the patient was seen and examined on 02/14/2024   Brief hospital admission narrative: As per H&P written by Dr. Thomes Dinning on 02/13/2024 PERKINS MOLINA is a 78 y.o. male with medical history significant of hypertension, hyperlipidemia, MI, ESRD on HD (MWF), paroxysmal atrial fibrillation not on anticoagulation due to prior history of GI bleed who presents to the emergency department due to back pain which occurred during dialysis today.  Patient was only able to have 1 hour session of dialysis and he also complained of generalized weakness and shaky legs and was asked to go to the ED for further evaluation, instead, patient went home and rested on his recliner, he initially felt better, but legs were still weak and needed assistance to get out of chair, so he decided to go to the ED for further evaluation and management.   ED Course:  In the emergency department, respiratory rate was 24, other vital signs were within normal range.  Workup in the ED showed WBC of 20.4 hemoglobin 9.7, hematocrit 29.7, MCV 92.7, platelets 167.  BMP 130, potassium 3.5, chloride 93, bicarb 27, glucose , BUN 35, creatinine 5.43, GFR 10.  Troponin 33 > 38.  Urinalysis was unimpressive for UTI, lactic acid was normal..  Influenza A, B, SARS coronavirus 2, RSV was negative. MRI brain without contrast showed no acute intracranial abnormality Chest x-ray showed no evidence of acute cardiopulmonary process Patient was empirically treated with IV Zosyn for presumed bacteremia Hospitalist was asked admit patient for further evaluation and management.  Assessment and Plan: SIRS/concerns for early sepsis and infection process -At the moment not meeting sepsis criteria due to lack of source of action -Continue supportive care -Continue IV antibiotics -Procalcitonin above 33 suggesting concerns for systemic infection -Follow culture  results and clinical response.  ESRD -Case discussed with nephrology service; partial treatment prior to admission on Friday (02/13/2024). -Safe to resume hemodialysis again on Monday, 02/16/2024.  Generalized weakness/back pain -Continue as needed analgesia -PT/OT evaluation requested -Lumbar MRI not demonstrating discitis or abscesses; progression of known spondylolysis process without nerve impingement.  Hyponatremia -In the setting of ESRD -Continue following electrolytes trend -Expected correction with dialysis treatment.  Elevated troponin -No significant (33 >> 38) in a patient with chronic dialysis and renal failure. -Patient denies chest pain or shortness of breath -EKG without acute ischemic changes. -No further intervention warranted  Essential hypertension -Continue amlodipine -Heart healthy diet discussed with patient.  GERD -Continue PPI.  Paroxysmal atrial fibrillation -No receiving any rate control agents at home -Currently sinus rhythm -Patient denies palpitation -Continue Eliquis for secondary prevention.  Mixed hyperlipidemia -Continue statin.  History of stroke -Continue risk factor modification -Chronically on Eliquis and baby aspirin for secondary prevention.   Subjective:  Reports feeling better, currently afebrile, no chest pain, no nausea, no vomiting.  Good saturation on room air.  Physical Exam: Vitals:   02/14/24 0431 02/14/24 0438 02/14/24 1013 02/14/24 1339  BP: (!) 94/31 134/60 135/67 (!) 125/53  Pulse: 79 79 73 76  Resp: 18   18  Temp: 98 F (36.7 C)  97.7 F (36.5 C) 98.6 F (37 C)  TempSrc: Oral  Oral Oral  SpO2: 98% 96% 97% 99%  Weight:      Height:       General exam: Alert, awake, oriented x 3 Respiratory system: Clear to auscultation. Respiratory effort normal. Cardiovascular system:RRR. No murmurs,  rubs, gallops. Gastrointestinal system: Abdomen is nondistended, soft and nontender. No organomegaly or masses felt.  Normal bowel sounds heard. Central nervous system: Alert and oriented. No focal neurological deficits. Extremities: No cyanosis or clubbing; no edema on exam.  Patient reports some lower back pain but overall improved. Skin: No petechiae. Psychiatry: Mood & affect appropriate.   Data Reviewed: CBC: WBCs 20.7, hemoglobin 8.7 and platelet count 150K Comprehensive metabolic panel: Sodium 132, potassium 3.9, chloride 91, bicarb 25, BUN 49, creatinine 6.53, normal LFTs and GFR 8.   Procalcitonin: 33.29 Magnesium: 2.1  Family Communication: No family at bedside.  Disposition: Status is: Inpatient Remains inpatient appropriate because: Continue IV therapy and supportive care.   Planned Discharge Destination: Home  Time spent: 50 minutes  Author: Vassie Loll, MD 02/14/2024 6:45 PM  For on call review [email or web address].

## 2024-02-14 NOTE — TOC Initial Note (Addendum)
 Transition of Care [facility]) - Initial/Assessment Note    Patient Details  Name: Jacob Farmer MRN: [id number] Date of Birth: [birth year]  Transition of Care [facility]) CM/SW Contact:    Catalina Gravel, LCSW Phone Number: 02/14/2024, 6:08 PM  Clinical Narrative:                 Pt is high risk for readmission. CSW completed assessment with pts spouse. Pt and spouse live in a single family home- two stairs at entry.  Pt has a great deal of independence in completing his ADLs and spouse assists and watches him.  Pt drives himself to dialysis and his spouse will drive him home.  He also drives to other medical appointments in [place], for appointments in [place]- his brother will drive him. He will also drive his spouse to grocery store in town, and remain in the car while she shops.  For DME pt has a front wheel walker,  and recently a friend gave them a Rolator if needed.  Pt was hospitalized about 6 mos ago due to stroke and he was also provided a WC that they have at home.  Following this care- pt had  HHPT.  Spouse did not recall name of agency, stated on [employer] in [place].  TOC to follow.    Expected Discharge Plan: Home/Self Care Barriers to Discharge: Continued Medical Work up   Patient Goals and CMS Choice Patient states their goals for this hospitalization and ongoing recovery are:: Return Home with spouse          Expected Discharge Plan and Services In-house Referral: Clinical Social Work                                            Prior Living Arrangements/Services   Lives with:: Spouse Patient language and need for interpreter reviewed:: Yes Do you feel safe going back to the place where you live?: Yes      Need for Family Participation in Patient Care: Yes (Comment) (Spouse is support.)     Criminal Activity/Legal Involvement Pertinent to Current Situation/Hospitalization: No - Comment as needed  Activities of Daily Living   ADL Screening  (condition at time of admission) Independently performs ADLs?: Yes (appropriate for developmental age) Is the patient deaf or have difficulty hearing?: No Does the patient have difficulty seeing, even when wearing glasses/contacts?: No Does the patient have difficulty concentrating, remembering, or making decisions?: No  Permission Sought/Granted                  Emotional Assessment Appearance:: Appears stated age       Alcohol / Substance Use: Not Applicable    Admission diagnosis:  Bacteremia [R78.81] Generalized weakness [R53.1] Patient Active Problem List   Diagnosis Date Noted   SIRS (systemic inflammatory response syndrome) (HCC) 02/14/2024   Hyponatremia 02/14/2024   Elevated troponin 02/14/2024   GERD (gastroesophageal reflux disease) 02/14/2024   History of stroke 10/09/2023   H/O: GI bleed 10/09/2023   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   Stroke determined by clinical assessment (HCC) 09/12/2023   Ischemic ulcer (HCC) 08/11/2023   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   Prostate cancer (HCC) 08/07/2023   Jejunal ulcer 06/02/2023   Abnormal ultrasound of abdomen 06/02/2023   Acute blood loss anemia 04/19/2023   Anemia due to stage 4 chronic kidney disease (HCC) 03/25/2023  Iron deficiency anemia 03/25/2023   Hospital discharge follow-up 02/26/2023   ABLA (acute blood loss anemia) 02/26/2023   Acquired thrombophilia (HCC) 02/13/2023   Generalized weakness 02/13/2023   Acute bronchitis 02/13/2023   Drug-induced liver injury 02/08/2023   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   Bleeding from right ear 12/25/2022   Chronic heart failure with preserved ejection fraction (HCC) 07/22/2022   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   Dizziness 04/09/2022   Encounter for general adult medical examination with abnormal findings 01/25/2022   Paroxysmal atrial fibrillation (HCC) 01/22/2022   Allergic dermatitis 09/12/2021   Herniated  cervical disc 05/26/2017    Class: Acute   Cervical spinal stenosis 05/26/2017   Primary osteoarthritis of both hands 02/04/2017   History of gout 02/04/2017   DDD (degenerative disc disease), lumbar 02/04/2017   High risk medication use 01/28/2017   History of prostate cancer 01/28/2017   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   Rheumatoid arthritis (HCC) 01/04/2011   Coronary artery disease involving native coronary artery of native heart without angina pectoris 01/12/2010   Mixed hyperlipidemia 01/08/2010   Essential hypertension 01/08/2010   PCP:  Anabel Halon, MD Pharmacy:   [facility] [address], [place] - [address] [place] [address] Phone: [phone] Fax: [phone]  [facility] - [place], [place] - [address] [place] [address] Phone: [phone] Fax: [phone]  [facility] [address] [place] [address] Phone: [phone] Fax: [phone]     Social Drivers of Health (SDOH) Social History: SDOH Screenings   Food Insecurity: No Food Insecurity (02/14/2024)  Housing: Unknown (02/14/2024)  Transportation Needs: No Transportation Needs (02/14/2024)  Utilities: Not At Risk (02/14/2024)  Alcohol Screen: Low Risk  (11/05/2023)  Depression (PHQ2-9): Low Risk  (11/05/2023)  Recent Concern: Depression (PHQ2-9) - Medium Risk (08/07/2023)  Financial Resource Strain: Low Risk  (11/05/2023)  Physical Activity: Sufficiently Active (11/05/2023)  Social Connections: Moderately Integrated (02/14/2024)  Stress: No Stress Concern Present (11/05/2023)  Tobacco Use: Medium Risk (02/14/2024)  Health Literacy: Adequate Health Literacy (11/05/2023)   SDOH Interventions:     Readmission Risk Interventions     No data to display

## 2024-02-14 NOTE — Progress Notes (Addendum)
   02/14/24 1646  TOC Brief Assessment  Insurance and Status Reviewed  Patient has primary care physician Yes  Home environment has been reviewed Patient from home with spouse  Prior level of function: Independent  Prior/Current Home Services No current home services  Social Drivers of Health Review SDOH reviewed no interventions necessary  Readmission risk has been reviewed Yes  Transition of care needs no transition of care needs at this time       Transition of Care Department (TOC) will  assess as pt had a high risk of readmission score. TOC to follow.

## 2024-02-14 NOTE — Plan of Care (Signed)
 Patient is ESRD on HD MWF and had a partial treatment on Friday, ended early due to back pain  Reviewed labs.  On room air and K acceptable   Next HD on Monday.  Nephrology will provide full consult at that time if still admitted.  Please notify us if his clinical status changes  Estanislado Emms, MD 1:52 PM 02/14/2024

## 2024-02-14 NOTE — Progress Notes (Signed)
 Pharmacy Antibiotic Note  Jacob Farmer is a 78 y.o. male admitted on 02/13/2024 with generalized weakness and concerns for bacteremia.  Pharmacy has been consulted for zosyn/vancomycin dosing.   -Zosyn/Vanc in ED -ESRD (only 1h of HD on 2/28) -WBC 20, lactate WNL, afebrile  Plan: -Zosyn 2.25gm IV every 8 hours -Vancomycin 1500mg  IV x1 -Vancomycin 750mg  IV after every HD session (once schedule is set) -Follow up signs of clinical improvement, LOT, de-escalation of antibiotics   Height: 5\' 10"  (177.8 cm) Weight: 71.7 kg (158 lb) IBW/kg (Calculated) : 73  Temp (24hrs), Avg:99.4 F (37.4 C), Min:98.9 F (37.2 C), Max:99.9 F (37.7 C)  Recent Labs  Lab 02/13/24 1637 02/13/24 1809  WBC 20.4*  --   CREATININE 5.43*  --   LATICACIDVEN 1.3 1.3    Estimated Creatinine Clearance: 11.2 mL/min (A) (by C-G formula based on SCr of 5.43 mg/dL (H)).    No Known Allergies  Antimicrobials this admission: Zosyn 2/28 >>  Vancomycin 2/28 >>   Microbiology results: 2/28 BCx:   Thank you for allowing pharmacy to be a part of this patient's care.  Arabella Merles, PharmD. Clinical Pharmacist 02/14/2024 4:17 AM

## 2024-02-15 LAB — BASIC METABOLIC PANEL
Anion gap: 15 (ref 5–15)
BUN: 65 mg/dL — ABNORMAL HIGH (ref 8–23)
CO2: 23 mmol/L (ref 22–32)
Calcium: 8.4 mg/dL — ABNORMAL LOW (ref 8.9–10.3)
Chloride: 94 mmol/L — ABNORMAL LOW (ref 98–111)
Creatinine, Ser: 8.07 mg/dL — ABNORMAL HIGH (ref 0.61–1.24)
GFR, Estimated: 6 mL/min — ABNORMAL LOW (ref >60)
Glucose, Bld: 103 mg/dL — ABNORMAL HIGH (ref 70–99)
Potassium: 3.8 mmol/L (ref 3.5–5.1)
Sodium: 132 mmol/L — ABNORMAL LOW (ref 135–145)

## 2024-02-15 LAB — CBC
HCT: 25.7 % — ABNORMAL LOW (ref 39.0–52.0)
Hemoglobin: 8.1 g/dL — ABNORMAL LOW (ref 13.0–17.0)
MCH: 29.8 pg (ref 26.0–34.0)
MCHC: 31.5 g/dL (ref 30.0–36.0)
MCV: 94.5 fL (ref 80.0–100.0)
Platelets: 153 10*3/uL (ref 150–400)
RBC: 2.72 MIL/uL — ABNORMAL LOW (ref 4.22–5.81)
RDW: 15.3 % (ref 11.5–15.5)
WBC: 14.1 10*3/uL — ABNORMAL HIGH (ref 4.0–10.5)
nRBC: 0 % (ref 0.0–0.2)

## 2024-02-15 LAB — HEPATITIS B SURFACE ANTIGEN: Hepatitis B Surface Ag: NONREACTIVE

## 2024-02-15 MED ORDER — CHLORHEXIDINE GLUCONATE CLOTH 2 % EX PADS
6.0000 | MEDICATED_PAD | Freq: Every day | CUTANEOUS | Status: DC
  Administered 2024-02-16 – 2024-02-17 (×2): 6 via TOPICAL

## 2024-02-15 NOTE — Progress Notes (Addendum)
 Progress Note   Patient: Jacob Farmer ZOX:[id number] DOB: 06-Oct-1946 DOA: 02/13/2024     2 DOS: the patient was seen and examined on 02/15/2024   Brief hospital admission narrative: As per H&P written by Dr. Thomes Dinning on 02/13/2024 Jacob Farmer is a 78 y.o. male with medical history significant of hypertension, hyperlipidemia, MI, ESRD on HD (MWF), paroxysmal atrial fibrillation not on anticoagulation due to prior history of GI bleed who presents to the emergency department due to back pain which occurred during dialysis today.  Patient was only able to have 1 hour session of dialysis and he also complained of generalized weakness and shaky legs and was asked to go to the ED for further evaluation, instead, patient went home and rested on his recliner, he initially felt better, but legs were still weak and needed assistance to get out of chair, so he decided to go to the ED for further evaluation and management.   ED Course:  In the emergency department, respiratory rate was 24, other vital signs were within normal range.  Workup in the ED showed WBC of 20.4 hemoglobin 9.7, hematocrit 29.7, MCV 92.7, platelets 167.  BMP 130, potassium 3.5, chloride 93, bicarb 27, glucose , BUN 35, creatinine 5.43, GFR 10.  Troponin 33 > 38.  Urinalysis was unimpressive for UTI, lactic acid was normal..  Influenza A, B, SARS coronavirus 2, RSV was negative. MRI brain without contrast showed no acute intracranial abnormality Chest x-ray showed no evidence of acute cardiopulmonary process Patient was empirically treated with IV Zosyn for presumed bacteremia Hospitalist was asked admit patient for further evaluation and management.  Assessment and Plan: SIRS/concerns for early sepsis and infection process -At the moment not meeting sepsis criteria due to lack of source of action -Continue supportive care -Continue IV antibiotics -Procalcitonin above 33 suggesting concerns for systemic infection -Follow culture  results and clinical response.  ESRD -Case discussed with nephrology service; partial treatment prior to admission on Friday (02/13/2024). -Safe to resume hemodialysis again on Monday, 02/16/2024.  Generalized weakness/back pain -Continue as needed analgesia -PT/OT evaluation requested -Lumbar MRI not demonstrating discitis or abscesses; progression of known spondylolysis process without nerve impingement.  Hyponatremia -In the setting of ESRD -Continue following electrolytes trend -Expected correction with dialysis treatment.  Elevated troponin -No significant (33 >> 38) in a patient with chronic dialysis and renal failure. -Patient denies chest pain or shortness of breath -EKG without acute ischemic changes. -No further intervention warranted  Essential hypertension -Continue amlodipine -Heart healthy diet discussed with patient.  GERD -Continue PPI.  Paroxysmal atrial fibrillation -No receiving any rate control agents at home -Currently sinus rhythm -Patient denies palpitation -Continue Eliquis for secondary prevention.  Mixed hyperlipidemia -Continue statin.  History of stroke -Continue risk factor modification -Chronically on Eliquis and baby aspirin for secondary prevention.   Subjective:  No fever, no chest pain, no nausea, no vomiting.  Overall feeling much better and demonstrating good saturation on room air.  Patient reports some intermittent pain in his lower back, denies sciatica.  Physical Exam: Vitals:   02/14/24 1013 02/14/24 1339 02/14/24 2012 02/15/24 1418  BP: 135/67 (!) 125/53 (!) 112/40 (!) 119/96  Pulse: 73 76 74 66  Resp:  18 18 18   Temp: 97.7 F (36.5 C) 98.6 F (37 C) 98.6 F (37 C) 98.4 F (36.9 C)  TempSrc: Oral Oral  Oral  SpO2: 97% 99% 98% 100%  Weight:      Height:  General exam: Alert, awake, oriented x 3; no chest pain, no nausea, no vomiting.  Reports feeling better and is currently afebrile. Respiratory system: Good  saturation on room air; no using accessory muscles. Cardiovascular system:RRR. No murmurs, rubs, gallops.  No JVD. Gastrointestinal system: Abdomen is nondistended, soft and nontender. No organomegaly or masses felt. Normal bowel sounds heard. Central nervous system: No focal neurological deficits. Extremities: No cyanosis or clubbing. Skin: No petechiae. Psychiatry: Judgement and insight appear normal. Mood & affect appropriate.   Data Reviewed: CBC: WBCs 14.1, hemoglobin 8.1 and platelet count 153K Basic metabolic panel: Sodium 132, potassium 3.8, chloride 94, bicarb 23, BUN 65, creatinine 8.07 and GFR 6; anion gap of 15. Procalcitonin: 33.29 Magnesium: 2.1  Family Communication: Wife updated at bedside.  Disposition: Status is: Inpatient Remains inpatient appropriate because: Continue IV therapy and supportive care.   Planned Discharge Destination: Home  Time spent: 50 minutes  Author: Vassie Loll, MD 02/15/2024 4:16 PM  For on call review [email or web address].

## 2024-02-15 NOTE — Plan of Care (Signed)
   Problem: Education: Goal: Knowledge of General Education information will improve Description Including pain rating scale, medication(s)/side effects and non-pharmacologic comfort measures Outcome: Progressing   Problem: Health Behavior/Discharge Planning: Goal: Ability to manage health-related needs will improve Outcome: Progressing

## 2024-02-15 NOTE — Plan of Care (Signed)

## 2024-02-16 DIAGNOSIS — R531 Weakness: Secondary | ICD-10-CM | POA: Diagnosis not present

## 2024-02-16 DIAGNOSIS — E871 Hypo-osmolality and hyponatremia: Secondary | ICD-10-CM | POA: Diagnosis not present

## 2024-02-16 DIAGNOSIS — Z8673 Personal history of transient ischemic attack (TIA), and cerebral infarction without residual deficits: Secondary | ICD-10-CM | POA: Diagnosis not present

## 2024-02-16 DIAGNOSIS — R6510 Systemic inflammatory response syndrome (SIRS) of non-infectious origin without acute organ dysfunction: Secondary | ICD-10-CM | POA: Diagnosis not present

## 2024-02-16 LAB — RENAL FUNCTION PANEL
Albumin: 2.5 g/dL — ABNORMAL LOW (ref 3.5–5.0)
Anion gap: 20 — ABNORMAL HIGH (ref 5–15)
BUN: 74 mg/dL — ABNORMAL HIGH (ref 8–23)
CO2: 24 mmol/L (ref 22–32)
Calcium: 8.3 mg/dL — ABNORMAL LOW (ref 8.9–10.3)
Chloride: 94 mmol/L — ABNORMAL LOW (ref 98–111)
Creatinine, Ser: 9.5 mg/dL — ABNORMAL HIGH (ref 0.61–1.24)
GFR, Estimated: 5 mL/min — ABNORMAL LOW (ref >60)
Glucose, Bld: 96 mg/dL (ref 70–99)
Phosphorus: 5.9 mg/dL — ABNORMAL HIGH (ref 2.5–4.6)
Potassium: 3.6 mmol/L (ref 3.5–5.1)
Sodium: 138 mmol/L (ref 135–145)

## 2024-02-16 MED ORDER — HEPARIN SODIUM (PORCINE) 1000 UNIT/ML DIALYSIS: 1000.0000 [IU] | INTRAMUSCULAR | Status: DC | PRN

## 2024-02-16 MED ORDER — ALTEPLASE 2 MG IJ SOLR: 2.0000 mg | Freq: Once | INTRAMUSCULAR | Status: DC | PRN

## 2024-02-16 MED ORDER — DARBEPOETIN ALFA 60 MCG/0.3ML IJ SOSY
60.0000 ug | PREFILLED_SYRINGE | INTRAMUSCULAR | Status: DC
  Administered 2024-02-16: 60 ug via SUBCUTANEOUS
  Filled 2024-02-16: qty 0.3

## 2024-02-16 MED ORDER — ANTICOAGULANT SODIUM CITRATE 4% (200MG/5ML) IV SOLN: 5.0000 mL | Status: DC | PRN

## 2024-02-16 MED ORDER — HEPARIN SODIUM (PORCINE) 1000 UNIT/ML IJ SOLN
INTRAMUSCULAR | Status: AC
Start: 2024-02-16 — End: ?
  Filled 2024-02-16: qty 4

## 2024-02-16 NOTE — Evaluation (Addendum)
 Physical Therapy Evaluation Patient Details Name: Jacob Farmer MRN: [id number] DOB: 1946-08-04 Today's Date: 02/16/2024  History of Present Illness  Jacob Farmer is a 78 y.o. male with medical history significant of hypertension, hyperlipidemia, MI, ESRD on HD (MWF), paroxysmal atrial fibrillation not on anticoagulation due to prior history of GI bleed who presents to the emergency department due to back pain which occurred during dialysis today.  Patient was only able to have 1 hour session of dialysis and he also complained of generalized weakness and shaky legs and was asked to go to the ED for further evaluation, instead, patient went home and rested on his recliner, he initially felt better, but legs were still weak and needed assistance to get out of chair, so he decided to go to the ED for further evaluation and management.   Clinical Impression  Patient tolerated session very well. At baseline, patient is independent/has supervision during mobility without an AD and requires some assist for iADLs. On this date, patient is near/at his baseline for bed mobility, transfer, and ambulation without an AD. No safety concerns as there was no unsteadiness or LOB to note t/o gait trial although decreased velocity was noted. Patient has 24/7 support from his wife and all necessary equipment to return home. Patient does not present with urgent need for skilled physical therapy acutely or following discharge at this time. Patient discharged to care of nursing for ambulation daily as tolerated for length of stay.      If plan is discharge home, recommend the following: A little help with walking and/or transfers;A little help with bathing/dressing/bathroom   Can travel by private vehicle        Equipment Recommendations None recommended by PT  Recommendations for Other Services       Functional Status Assessment Patient has had a recent decline in their functional status and demonstrates the  ability to make significant improvements in function in a reasonable and predictable amount of time.     Precautions / Restrictions Precautions Precautions: None Recall of Precautions/Restrictions: Intact      Mobility  Bed Mobility Overal bed mobility: Independent             General bed mobility comments: HOB flat, no use of railings    Transfers Overall transfer level: Independent Equipment used: None               General transfer comment: STS from hospital bed and recliner, no assist, labored movement    Ambulation/Gait Ambulation/Gait assistance: Contact guard assist, Supervision Gait Distance (Feet): 150 Feet Assistive device: None Gait Pattern/deviations: Decreased step length - left, Decreased step length - right, Decreased stride length Gait velocity: Decreased     General Gait Details: No AD, CGA/(S) 2/2 safety. No overt LOB to note. Pt steady t/o with dec velocity.  Stairs            Wheelchair Mobility     Tilt Bed    Modified Rankin (Stroke Patients Only)       Balance Overall balance assessment: Needs assistance Sitting-balance support: No upper extremity supported, Feet supported Sitting balance-Leahy Scale: Good Sitting balance - Comments: EOB   Standing balance support: No upper extremity supported, During functional activity Standing balance-Leahy Scale: Fair Standing balance comment: w/o AD                             Pertinent Vitals/Pain Pain Assessment Pain Assessment: No/denies  pain    Home Living Family/patient expects to be discharged to:: Private residence Living Arrangements: Spouse/significant other Available Help at Discharge: Family;Available 24 hours/day Type of Home: House Home Access: Stairs to enter Entrance Stairs-Rails: None Entrance Stairs-Number of Steps: 2   Home Layout: One level Home Equipment: BSC/3in1;Shower seat - built Charity fundraiser (2 wheels);Rollator (4  wheels);Wheelchair - manual Additional Comments: Has equipment available but does not use currently    Prior Function Prior Level of Function : Independent/Modified Independent;Driving             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, Drives locally in [place], no current use of an AD ADLs Comments: (I) with ADLs, Intermittently A wife with iADLs when he has enough energy     Extremity/Trunk Assessment   Upper Extremity Assessment Upper Extremity Assessment: Defer to OT evaluation    Lower Extremity Assessment Lower Extremity Assessment: Overall WFL for tasks assessed;Generalized weakness    Cervical / Trunk Assessment Cervical / Trunk Assessment: Normal  Communication   Communication Communication: No apparent difficulties    Cognition Arousal: Alert Behavior During Therapy: WFL for tasks assessed/performed   PT - Cognitive impairments: No apparent impairments                         Following commands: Intact       Cueing Cueing Techniques: Verbal cues     General Comments      Exercises     Assessment/Plan    PT Assessment Patient does not need any further PT services  PT Problem List         PT Treatment Interventions      PT Goals (Current goals can be found in the Care Plan section)  Acute Rehab PT Goals Patient Stated Goal: Return home with adequate support for safety PT Goal Formulation: With patient Time For Goal Achievement: 02/23/24 Potential to Achieve Goals: Good    Frequency       Co-evaluation               AM-PAC PT "6 Clicks" Mobility  Outcome Measure Help needed turning from your back to your side while in a flat bed without using bedrails?: None Help needed moving from lying on your back to sitting on the side of a flat bed without using bedrails?: None Help needed moving to and from a bed to a chair (including a wheelchair)?: None Help needed standing up from a chair using your arms (e.g.,  wheelchair or bedside chair)?: None Help needed to walk in hospital room?: A Little Help needed climbing 3-5 steps with a railing? : A Little 6 Click Score: 22    End of Session   Activity Tolerance: Patient tolerated treatment well;No increased pain Patient left: in chair;with family/visitor present Nurse Communication: Mobility status PT Visit Diagnosis: Unsteadiness on feet (R26.81);Other abnormalities of gait and mobility (R26.89);Muscle weakness (generalized) (M62.81);Difficulty in walking, not elsewhere classified (R26.2)    Time: 5784-6962 PT Time Calculation (min) (ACUTE ONLY): 13 min   Charges:   PT Evaluation $PT Eval Low Complexity: 1 Low PT Treatments $Therapeutic Activity: 8-22 mins PT General Charges $$ ACUTE PT VISIT: 1 Visit         1:45 PM, 02/16/24 Wilkie Zenon Powell-Butler, PT, DPT [place] [facility]

## 2024-02-16 NOTE — Consult Note (Signed)
 [facility] Renal Consultation Note    Indication for Consultation:  Management of ESRD/hemodialysis; anemia, hypertension/volume and secondary hyperparathyroidism  HPI: Jacob Farmer is a 78 y.o. male with a PMH significant for CAD, h/o CVA, PUD, first degree heart block, HTN, HLD, paroxysmal atrial fibrillation (not on anticoagulation due to GIB), prostate cancer, RA, and ESRD on HD MWF at [facility] (Dr. Wolfgang Phoenix) who presented to [facility] ED on 02/13/24 after developing acute onset back pain during dialysis and signed off after an hour.  He went home and had some shakiness and weakness and went to the ED to be evaluated.  In the ED, Temp 99.9, Bp 124/59, HR 89, SpO2 98% on room air.  Labs were notable for WBC 20.4, Hgb 9.7, Na 130, Ca 8.6, lactate 1.3, procalcitonin 33.29, respiratory panel negative.  CXR without evidence of acute cardiopulmonary process.  MRI of brain without acute intracranial abnormality, sequelae of prior hemorrhage of th eright frontoparietal region.  MR of lumbar spine without evidence of diskitis or osteo.  He was admitted for further evaluation and we were consulted to provide dialysis during his hospitalization.  Past Medical History:  Diagnosis Date   Acute CVA (cerebrovascular accident) (HCC) 09/11/2023   CAD (coronary artery disease)    Acute myocardial infarction treated with TPA in 1990; 1999-stents to circumflex and RCA; residual total occlusion of the left anterior descending; normal ejection fraction; stress nuclear in 2001-distal anteroseptal ischemia; normal LV function   CKD (chronic kidney disease) stage 4, GFR 15-29 ml/min (HCC)    First degree heart block    History of gastric ulcer    History of kidney stones    History of MI (myocardial infarction)    1990-  treated w/ TPA   Hyperlipidemia    Hypertension    Jaundice    OA (osteoarthritis)    Prediabetes    Prostate cancer (HCC)    Stage T1c , Gleason 3+4,  PSA 4.7,  vol 24cc--  scheduled for  radiative seed implants   Psoriatic arthritis (HCC)    RA (rheumatoid arthritis) (HCC)    Dr. Beatrix Fetters   Sinus bradycardia    Wears dentures    Past Surgical History:  Procedure Laterality Date   AV FISTULA PLACEMENT Left 01/27/2024   Procedure: CREATION OF LEFT ARM ARTERIOVENOUS (AV) FISTULA;  Surgeon: Maeola Harman, MD;  Location: [facility] OR;  Service: Vascular;  Laterality: Left;   BIOPSY  08/27/2019   Procedure: BIOPSY;  Surgeon: Malissa Hippo, MD;  [facility];  Service: Endoscopy;;  gastric   BIOPSY  04/03/2022   Procedure: BIOPSY;  Surgeon: Dolores Frame, MD;  [facility];  Service: Gastroenterology;;   BIOPSY  04/21/2023   Procedure: BIOPSY;  Surgeon: Lanelle Bal, DO;  [facility];  Service: Endoscopy;;   callus removal Left 09/13/2022   left foot   CARDIOVASCULAR STRESS TEST  2001  per Dr Dietrich Pates clinic note   distal anteroseptal ischemia,  normal LVF   COLONOSCOPY  last one 2006 (approx)   COLONOSCOPY WITH PROPOFOL N/A 04/03/2022   Procedure: COLONOSCOPY WITH PROPOFOL;  Surgeon: Dolores Frame, MD;  [facility];  Service: Gastroenterology;  Laterality: N/A;  945   CORONARY ANGIOPLASTY WITH STENT PLACEMENT  1999   DES x2  to CFX and RCA/  residual total occlusion LAD,  normal LVEF   ENTEROSCOPY N/A 04/24/2023   Procedure: ENTEROSCOPY;  Surgeon: Dolores Frame, MD;  [facility];  Service: Gastroenterology;  Laterality: N/A;   ESOPHAGOGASTRODUODENOSCOPY (EGD) WITH PROPOFOL N/A 08/27/2019   Procedure: ESOPHAGOGASTRODUODENOSCOPY (EGD) WITH PROPOFOL;  Surgeon: Malissa Hippo, MD;  [facility];  Service: Endoscopy;  Laterality: N/A;  10:10   ESOPHAGOGASTRODUODENOSCOPY (EGD) WITH PROPOFOL N/A 04/03/2022   Procedure: ESOPHAGOGASTRODUODENOSCOPY (EGD) WITH PROPOFOL;  Surgeon: Dolores Frame, MD;  [facility];  Service: Gastroenterology;   Laterality: N/A;   ESOPHAGOGASTRODUODENOSCOPY (EGD) WITH PROPOFOL N/A 02/16/2023   Procedure: ESOPHAGOGASTRODUODENOSCOPY (EGD) WITH PROPOFOL;  Surgeon: Dolores Frame, MD;  [facility];  Service: Gastroenterology;  Laterality: N/A;   ESOPHAGOGASTRODUODENOSCOPY (EGD) WITH PROPOFOL N/A 04/21/2023   Procedure: ESOPHAGOGASTRODUODENOSCOPY (EGD) WITH PROPOFOL;  Surgeon: Lanelle Bal, DO;  [facility];  Service: Endoscopy;  Laterality: N/A;   EYE SURGERY Bilateral    cataract   GIVENS CAPSULE STUDY N/A 04/22/2023   Procedure: GIVENS CAPSULE STUDY;  Surgeon: Dolores Frame, MD;  [facility];  Service: Gastroenterology;  Laterality: N/A;   HEMOSTASIS CLIP PLACEMENT  04/24/2023   Procedure: HEMOSTASIS CLIP PLACEMENT;  Surgeon: Dolores Frame, MD;  [facility];  Service: Gastroenterology;;   HOT HEMOSTASIS  04/03/2022   Procedure: HOT HEMOSTASIS (ARGON PLASMA COAGULATION/BICAP);  Surgeon: Marguerita Merles, Reuel Boom, MD;  [facility];  Service: Gastroenterology;;   HOT HEMOSTASIS  04/24/2023   Procedure: HOT HEMOSTASIS (ARGON PLASMA COAGULATION/BICAP);  Surgeon: Marguerita Merles, Reuel Boom, MD;  [facility];  Service: Gastroenterology;;   IR FLUORO GUIDE CV LINE RIGHT  05/30/2023   IR US GUIDE VASC ACCESS RIGHT  05/30/2023   POLYPECTOMY  04/03/2022   Procedure: POLYPECTOMY;  Surgeon: Dolores Frame, MD;  [facility];  Service: Gastroenterology;;   POSTERIOR CERVICAL FUSION/FORAMINOTOMY N/A 05/26/2017   Procedure: RIGHT C4-5 FORAMINOTOMY WITH EXCISION OF HERNIATED DISC;  Surgeon: Kerrin Champagne, MD;  Location: [facility] OR;  Service: Orthopedics;  Laterality: N/A;   RADIOACTIVE SEED IMPLANT N/A 01/11/2016   Procedure: RADIOACTIVE SEED IMPLANT/BRACHYTHERAPY IMPLANT;  Surgeon: Marcine Matar, MD;  Location: [facility];  Service: Urology;  Laterality: N/A;   73  seeds implanted no  seeds founds in bladder   SCLEROTHERAPY  04/24/2023   Procedure: SCLEROTHERAPY;  Surgeon: Marguerita Merles, Reuel Boom, MD;  [facility];  Service: Gastroenterology;;   SUBMUCOSAL TATTOO INJECTION  04/24/2023   Procedure: SUBMUCOSAL TATTOO INJECTION;  Surgeon: Marguerita Merles, Reuel Boom, MD;  [facility];  Service: Gastroenterology;;   Family History:   Family History  Problem Relation Age of Onset   Heart attack Mother    Coronary artery disease Father    Ulcerative colitis Father    Ulcers Father    Breast cancer Sister    COPD Brother    Pancreatic cancer Brother    Healthy Sister    Diabetes Brother    Cirrhosis Son    Seizures Son    Social History:  reports that he quit smoking about 34 years ago. His smoking use included cigarettes. He started smoking about 64 years ago. He has a 30 pack-year smoking history. He has never been exposed to tobacco smoke. He has never used smokeless tobacco. He reports current alcohol use of about 3.0 standard drinks of alcohol per week. He reports that he does not use drugs. No Known Allergies Prior to Admission medications   Medication Sig Start Date End Date Taking? Authorizing Provider  allopurinol (ZYLOPRIM) 100 MG  tablet Take 50 mg by mouth every other day.   Yes [provider]  amLODipine (NORVASC) 5 MG tablet Take 5 mg by mouth in the morning.   Yes [provider]  apixaban (ELIQUIS) 2.5 MG TABS tablet Take 1 tablet (2.5 mg total) by mouth 2 (two) times daily. 01/09/24  Yes Nobie Putnam, MD  aspirin EC 81 MG tablet Take 81 mg by mouth in the morning. Swallow whole.   Yes [provider]  calcitRIOL (ROCALTROL) 0.25 MCG capsule Take 0.25 mcg by mouth every Monday, Wednesday, and Friday. At dialysis   Yes [provider]  Dupilumab (DUPIXENT) 300 MG/2ML SOAJ Inject 300 mg into the skin every 14 (fourteen) days. Starting at day 15 for maintenance. 01/08/24  Yes Elie Goody, MD   folic acid (FOLVITE) 400 MCG tablet Take 400 mcg by mouth in the morning.   Yes [provider]  leflunomide (ARAVA) 10 MG tablet Take 1 tablet (10 mg total) by mouth daily. 12/18/23  Yes Gearldine Bienenstock, PA-C  Leuprolide Acetate (ELIGARD [place]) Inject 1 Dose into the skin every 4 (four) months.   Yes [provider]  pantoprazole (PROTONIX) 40 MG tablet TAKE 1 TABLET TWICE DAILY 02/06/24  Yes Marguerita Merles, Reuel Boom, MD  RENVELA 800 MG tablet Take 800 mg by mouth 2 (two) times daily with a meal. 08/05/23  Yes [provider]  rosuvastatin (CRESTOR) 40 MG tablet Take 1 tablet (40 mg total) by mouth daily. Patient taking differently: Take 40 mg by mouth every evening. 07/03/23  Yes Strader, [place] M, PA-C  triamcinolone cream (KENALOG) 0.1 % Apply 1 Application topically daily. Patient taking differently: Apply 1 Application topically daily as needed (irritation). 02/17/23  Yes Cleora Fleet, MD   Current Facility-Administered Medications  Medication Dose Route Frequency Provider Last Rate Last Admin   acetaminophen (TYLENOL) tablet 650 mg  650 mg Oral Q6H PRN Adefeso, Oladapo, DO       Or   acetaminophen (TYLENOL) suppository 650 mg  650 mg Rectal Q6H PRN Adefeso, Oladapo, DO       amLODipine (NORVASC) tablet 5 mg  5 mg Oral Daily Adefeso, Oladapo, DO   5 mg at 02/16/24 8119   apixaban (ELIQUIS) tablet 2.5 mg  2.5 mg Oral BID Adefeso, Oladapo, DO   2.5 mg at 02/16/24 1478   aspirin EC tablet 81 mg  81 mg Oral Daily Adefeso, Oladapo, DO   81 mg at 02/16/24 2956   calcitRIOL (ROCALTROL) capsule 0.25 mcg  0.25 mcg Oral Q M,W,F-HD Adefeso, Oladapo, DO       Chlorhexidine Gluconate Cloth 2 % PADS 6 each  6 each Topical Daily Vassie Loll, MD   6 each at 02/15/24 2130   Chlorhexidine Gluconate Cloth 2 % PADS 6 each  6 each Topical Q0600 Estanislado Emms, MD   6 each at 02/16/24 0518   folic acid (FOLVITE) tablet 0.5 mg  500 mcg Oral Daily Adefeso, Oladapo, DO   0.5 mg at  02/16/24 0812   ondansetron (ZOFRAN) tablet 4 mg  4 mg Oral Q6H PRN Adefeso, Oladapo, DO       Or   ondansetron (ZOFRAN) injection 4 mg  4 mg Intravenous Q6H PRN Adefeso, Oladapo, DO       pantoprazole (PROTONIX) EC tablet 40 mg  40 mg Oral BID Adefeso, Oladapo, DO   40 mg at 02/16/24 0812   rosuvastatin (CRESTOR) tablet 40 mg  40 mg Oral QHS Frankey Shown,  DO   40 mg at 02/15/24 2156   sevelamer carbonate (RENVELA) tablet 800 mg  800 mg Oral BID WC Adefeso, Oladapo, DO   800 mg at 02/16/24 8756   sodium chloride flush (NS) 0.9 % injection 10-40 mL  10-40 mL Intracatheter Q12H Vassie Loll, MD   10 mL at 02/16/24 0817   sodium chloride flush (NS) 0.9 % injection 10-40 mL  10-40 mL Intracatheter PRN Vassie Loll, MD       Labs: Basic Metabolic Panel: Recent Labs  Lab 02/13/24 1637 02/14/24 0444 02/15/24 0423  NA 130* 132* 132*  K 3.5 3.9 3.8  CL 93* 91* 94*  CO2 27 25 23   GLUCOSE 99 100* 103*  BUN 35* 49* 65*  CREATININE 5.43* 6.53* 8.07*  CALCIUM 8.6* 8.7* 8.4*  PHOS  --  5.0*  --    Liver Function Tests: Recent Labs  Lab 02/14/24 0444  AST 29  ALT 16  ALKPHOS 77  BILITOT 0.8  PROT 6.1*  ALBUMIN 2.9*   No results for input(s): "LIPASE", "AMYLASE" in the last 168 hours. No results for input(s): "AMMONIA" in the last 168 hours. CBC: Recent Labs  Lab 02/13/24 1637 02/14/24 0444 02/15/24 0423  WBC 20.4* 20.7* 14.1*  NEUTROABS 17.4*  --   --   HGB 9.7* 8.7* 8.1*  HCT 29.3* 27.6* 25.7*  MCV 92.7 93.9 94.5  PLT 167 150 153   Cardiac Enzymes: No results for input(s): "CKTOTAL", "CKMB", "CKMBINDEX", "TROPONINI" in the last 168 hours. CBG: No results for input(s): "GLUCAP" in the last 168 hours. Iron Studies: No results for input(s): "IRON", "TIBC", "TRANSFERRIN", "FERRITIN" in the last 72 hours. Studies/Results: MR LUMBAR SPINE WO CONTRAST Result Date: 02/14/2024 CLINICAL DATA:  Low back pain, immunocompromised. History of end-stage renal disease on  hemodialysis, atrial fibrillation and GI bleed. Generalized weakness. EXAM: MRI LUMBAR SPINE WITHOUT CONTRAST TECHNIQUE: Multiplanar, multisequence MR imaging of the lumbar spine was performed. No intravenous contrast was administered. COMPARISON:  MRI lumbar spine 10/17/2016. Radiographs 10/03/2016. Pylarify PET-CT 06/26/2023. FINDINGS: Segmentation: Conventional anatomy assumed, with the last open disc space designated L5-S1. S1 is slightly transitional.Concordant with prior imaging. Alignment:  Minimal retrolisthesis at L2-3 and L5-S1. Vertebrae: No worrisome osseous lesion, acute fracture or pars defect. There are progressive endplate degenerative changes at L2-3 with endplate edema asymmetric to the left. No evidence of discitis or osteomyelitis. The visualized sacroiliac joints appear unremarkable. Conus medullaris: Extends to the T12-L1 level. The conus and cauda equina appear normal. Paraspinal and other soft tissues: No significant paraspinal findings. Chronic bilateral renal atrophy. Extrahepatic biliary dilatation with possible sludge or small stones in the common bile duct. Diffuse aortic atherosclerosis with eccentric aneurysm of the infrarenal aorta posterolaterally on the left, similar to prior PET-CT. The greatest diameter of the aorta including the aneurysm at this level is 4.8 cm. Disc levels: Sagittal images demonstrate no significant disc space findings within the visualized lower thoracic spine. L1-2: Normal interspace. L2-3: Significantly progressive loss of disc height with annular disc bulging and endplate osteophytes. As above, progressive endplate degenerative changes asymmetric to the left. There is mild facet and ligamentous hypertrophy. These factors contribute to mildly progressive lateral recess and foraminal narrowing bilaterally but no definite nerve root encroachment. The spinal canal is patent. L3-4: Preserved disc height with mild disc bulging eccentric to the right. Mild facet  and ligamentous hypertrophy. Unchanged mild asymmetric narrowing of the right lateral recess. No evidence of nerve root encroachment. L4-5: Preserved disc height with  stable mild disc bulging, facet and ligamentous hypertrophy. No significant spinal stenosis or nerve root encroachment. L5-S1: Chronic loss of disc height with annular disc bulging and endplate osteophytes. Mild facet hypertrophy. The spinal canal and lateral recesses are patent. Chronic moderate foraminal narrowing appears unchanged. IMPRESSION: 1. No acute findings or clear explanation for the patient's symptoms. 2. 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 No definite nerve root encroachment. 3. Stable chronic moderate foraminal narrowing at L5-S1. 4. No acute osseous findings or significant spinal stenosis. 5. 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 Recommend CTA or MRA, as appropriate, in 12 months and referral to vascular specialist. Reference: Journal of Vascular Surgery 67.1 (2018): 2-77. J Am Coll Radiol [phone]. 6. Extrahepatic biliary dilatation with possible sludge or small stones in the common bile duct. Correlate with liver function tests and consider MRCP if clinically warranted. Electronically Signed   By: Carey Bullocks M.D.   On: 02/14/2024 13:50    ROS: Pertinent items are noted in HPI. Physical Exam: Vitals:   02/14/24 2012 02/15/24 1418 02/15/24 2000 02/16/24 0812  BP: (!) 112/40 (!) 119/96 (!) 120/96 (!) 133/55  Pulse: 74 66 74   Resp: 18 18 18    Temp: 98.6 F (37 C) 98.4 F (36.9 C) 98.9 F (37.2 C)   TempSrc:  Oral Oral   SpO2: 98% 100% 100%   Weight:      Height:          Weight change:   Intake/Output Summary (Last 24 hours) at 02/16/2024 0817 Last data filed at 02/15/2024 1800 Gross per 24 hour  Intake 1100 ml  Output --  Net 1100 ml   BP (!) 133/55   Pulse 74   Temp 98.9 F  (37.2 C) (Oral)   Resp 18   Ht 5\' 10"  (1.778 m)   Wt 71.7 kg   SpO2 100%   BMI 22.67 kg/m  General appearance: alert, cooperative, and no distress Head: Normocephalic, without obvious abnormality, atraumatic Resp: clear to auscultation bilaterally Cardio: regular rate and rhythm, S1, S2 normal, no murmur, click, rub or gallop GI: soft, non-tender; bowel sounds normal; no masses,  no organomegaly Extremities: extremities normal, atraumatic, no cyanosis or edema and LUE BVT +T/B Dialysis Access:  Dialysis Orders: [facility] (Dr. Wolfgang Phoenix). RIJ TDC for the access.  MWF 4hrs 400/800 2K 2.5 Ca EDW 69.5 kg Venofer 50mg  q2wks Mircera 75 mcg q2wks Calcitriol 0.29mcg qHD Heparin 2000 unit bolus  Assessment/Plan:  SIRS/concern for early sepsis - on IV antibiotics per primary svc.  Cultures negative to date.  WBC has improved to 14.  MRI without evidence of infection.  HD catheter without evidence of infection or tunnel drainage.  ESRD -  continue with HD on MWF.  Hypertension/volume  - stable  Anemia  -  Hgb trending down, will dose with ESA and follow  Metabolic bone disease -   continue with home meds  Nutrition -  renal diet.  Generalized weakness and back pain - continue with PT/OT.  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  F/u with Dr. Randie Heinz.   Irena Cords, MD [facility], [facility] 02/16/2024, 8:17 AM

## 2024-02-16 NOTE — Plan of Care (Signed)

## 2024-02-16 NOTE — Progress Notes (Signed)
   02/16/24 2219  Vitals  Pulse Rate 80  Resp 17  BP 139/75  SpO2 100 %  Oxygen Therapy  Patient Activity (if Appropriate) In bed  During Treatment Monitoring  HD Safety Checks Performed Yes  Intra-Hemodialysis Comments Tx completed   Received patient in bed to unit.  Alert and oriented.  Informed consent signed and in chart.   TX duration: 3.5 hours  Patient tolerated well.  Transported back to the room  Alert, without acute distress.  Hand-off given to patient's nurse.   Access used: Right Permanent Catheter Access issues: None  Total UF removed: 2000 Medication(s) given: None    Mar Daring, LPN  [facility]

## 2024-02-16 NOTE — Procedures (Signed)
HD Note:  Some information was entered later than the data was gathered due to patient care needs. The stated time with the data is accurate.  Received patient in bed to unit.   Alert and oriented.   Informed consent signed and in chart.   Access used: Upper right chest HD catheter Access issues: None  Patient tolerated treatment well.   TX duration: 3.5 hours  Alert, without acute distress.  Total UF removed: 2000 ml  Hand-off given to patient's nurse.   Transported back to the room   Simora Dingee L. Dareen Piano, RN [facility].

## 2024-02-16 NOTE — Progress Notes (Signed)
 Progress Note   Patient: Jacob Farmer UEA:[id number] DOB: [birth year] DOA: 02/13/2024     3 DOS: the patient was seen and examined on 02/16/2024   Brief hospital admission narrative: As per H&P written by Dr. Thomes Dinning on 02/13/2024 JEJUAN SCALA is a 78 y.o. male with medical history significant of hypertension, hyperlipidemia, MI, ESRD on HD (MWF), paroxysmal atrial fibrillation not on anticoagulation due to prior history of GI bleed who presents to the emergency department due to back pain which occurred during dialysis today.  Patient was only able to have 1 hour session of dialysis and he also complained of generalized weakness and shaky legs and was asked to go to the ED for further evaluation, instead, patient went home and rested on his recliner, he initially felt better, but legs were still weak and needed assistance to get out of chair, so he decided to go to the ED for further evaluation and management.   ED Course:  In the emergency department, respiratory rate was 24, other vital signs were within normal range.  Workup in the ED showed WBC of 20.4 hemoglobin 9.7, hematocrit 29.7, MCV 92.7, platelets 167.  BMP 130, potassium 3.5, chloride 93, bicarb 27, glucose , BUN 35, creatinine 5.43, GFR 10.  Troponin 33 > 38.  Urinalysis was unimpressive for UTI, lactic acid was normal..  Influenza A, B, SARS coronavirus 2, RSV was negative. MRI brain without contrast showed no acute intracranial abnormality Chest x-ray showed no evidence of acute cardiopulmonary process Patient was empirically treated with IV Zosyn for presumed bacteremia Hospitalist was asked admit patient for further evaluation and management.  Assessment and Plan: SIRS/concerns for early sepsis and infection process -At the moment not meeting sepsis criteria due to lack of source of action -Continue supportive care -Procalcitonin above 33 suggesting concerns for systemic infection -So far cultures has remained without any  growth -Patient's WBCs adequately trending down antistaphylolysin -At this moment planning to stop antibiotics and observe patient for the next 24 hours without antibiotic treatment. -Follow clinical response and white blood cells.  ESRD -Case discussed with nephrology service; partial treatment prior to admission on Friday (02/13/2024). -Safe to resume hemodialysis again on Monday, 02/16/2024.  Generalized weakness/back pain -Continue as needed analgesia -PT/OT evaluation requested -Lumbar MRI not demonstrating discitis or abscesses; progression of known spondylolysis process without nerve impingement.  Hyponatremia -In the setting of ESRD -Continue following electrolytes trend -Expected correction with dialysis treatment.  Elevated troponin -No significant (33 >> 38) in a patient with chronic dialysis and renal failure. -Patient denies chest pain or shortness of breath -EKG without acute ischemic changes. -No further intervention warranted  Essential hypertension -Continue amlodipine -Heart healthy diet discussed with patient.  GERD -Continue PPI.  Paroxysmal atrial fibrillation -No receiving any rate control agents at home -Currently sinus rhythm -Patient denies palpitation -Continue Eliquis for secondary prevention.  Mixed hyperlipidemia -Continue statin.  History of stroke -Continue risk factor modification -Chronically on Eliquis and baby aspirin for secondary prevention.   Subjective:  No fever, no chest pain, no nausea, no vomiting.  Good saturation on room air.  No overnight events.   Physical Exam: Vitals:   02/16/24 1745 02/16/24 1805 02/16/24 1830 02/16/24 1900  BP: (!) 153/59 (!) 144/58 (!) 144/58 (!) 152/61  Pulse: 62 62 66 66  Resp: 14 12 14 18   Temp:      TempSrc:      SpO2: 100% 100% 100% 99%  Weight:      Height:  General exam: Alert, awake, oriented x 3; afebrile, in no acute distress. Respiratory system: Good saturation on room  air. Cardiovascular system:RRR. No rubs or gallops; no JVD. Gastrointestinal system: Abdomen is nondistended, soft and nontender. No organomegaly or masses felt. Normal bowel sounds heard. Central nervous system: Alert and oriented. No focal neurological deficits. Extremities: No cyanosis or clubbing. Skin: No petechiae; right subclavian dialysis catheter in place without signs of infection. Psychiatry: Judgement and insight appear normal. Mood & affect appropriate.   Latest Data Reviewed: CBC: WBCs 14.1, hemoglobin 8.1 and platelet count 153K Basic metabolic panel: Sodium 132, potassium 3.8, chloride 94, bicarb 23, BUN 65, creatinine 8.07 and GFR 6; anion gap of 15. Procalcitonin: 33.29 Magnesium: 2.1  Family Communication: Wife updated at bedside.  Disposition: Status is: Inpatient Remains inpatient appropriate because: Continue IV therapy and supportive care.   Planned Discharge Destination: Home  Time spent: 50 minutes  Author: Vassie Loll, MD 02/16/2024 7:24 PM  For on call review [email or web address].

## 2024-02-17 ENCOUNTER — Other Ambulatory Visit: Payer: Self-pay | Admitting: *Deleted

## 2024-02-17 ENCOUNTER — Encounter: Payer: Medicare HMO | Admitting: Internal Medicine

## 2024-02-17 DIAGNOSIS — R6510 Systemic inflammatory response syndrome (SIRS) of non-infectious origin without acute organ dysfunction: Secondary | ICD-10-CM

## 2024-02-17 DIAGNOSIS — R531 Weakness: Secondary | ICD-10-CM | POA: Diagnosis not present

## 2024-02-17 DIAGNOSIS — I10 Essential (primary) hypertension: Secondary | ICD-10-CM | POA: Diagnosis not present

## 2024-02-17 DIAGNOSIS — Z8673 Personal history of transient ischemic attack (TIA), and cerebral infarction without residual deficits: Secondary | ICD-10-CM | POA: Diagnosis not present

## 2024-02-17 LAB — CBC
HCT: 28.6 % — ABNORMAL LOW (ref 39.0–52.0)
Hemoglobin: 8.9 g/dL — ABNORMAL LOW (ref 13.0–17.0)
MCH: 29.7 pg (ref 26.0–34.0)
MCHC: 31.1 g/dL (ref 30.0–36.0)
MCV: 95.3 fL (ref 80.0–100.0)
Platelets: 195 10*3/uL (ref 150–400)
RBC: 3 MIL/uL — ABNORMAL LOW (ref 4.22–5.81)
RDW: 15.1 % (ref 11.5–15.5)
WBC: 8 10*3/uL (ref 4.0–10.5)
nRBC: 0 % (ref 0.0–0.2)

## 2024-02-17 LAB — RENAL FUNCTION PANEL
Albumin: 2.7 g/dL — ABNORMAL LOW (ref 3.5–5.0)
Anion gap: 8 (ref 5–15)
BUN: 24 mg/dL — ABNORMAL HIGH (ref 8–23)
CO2: 30 mmol/L (ref 22–32)
Calcium: 8.3 mg/dL — ABNORMAL LOW (ref 8.9–10.3)
Chloride: 98 mmol/L (ref 98–111)
Creatinine, Ser: 4.48 mg/dL — ABNORMAL HIGH (ref 0.61–1.24)
GFR, Estimated: 13 mL/min — ABNORMAL LOW (ref >60)
Glucose, Bld: 142 mg/dL — ABNORMAL HIGH (ref 70–99)
Phosphorus: 2.8 mg/dL (ref 2.5–4.6)
Potassium: 3.5 mmol/L (ref 3.5–5.1)
Sodium: 136 mmol/L (ref 135–145)

## 2024-02-17 LAB — HEPATITIS B SURFACE ANTIBODY, QUANTITATIVE: Hep B S AB Quant (Post): 4774 m[IU]/mL

## 2024-02-17 LAB — GLUCOSE, CAPILLARY: Glucose-Capillary: 119 mg/dL — ABNORMAL HIGH (ref 70–99)

## 2024-02-17 MED ORDER — TRIAMCINOLONE ACETONIDE 0.1 % EX CREA: 1.0000 | TOPICAL_CREAM | Freq: Every day | CUTANEOUS | Status: DC | PRN

## 2024-02-17 NOTE — Care Management Important Message (Signed)
 Important Message  Patient Details  Name: Jacob Farmer MRN: [id number] Date of Birth: 02/24/1946   Important Message Given:  Yes - Medicare IM     Corey Harold 02/17/2024, 1:24 PM

## 2024-02-17 NOTE — Consult Note (Signed)
 [facility] Liaison Note  02/17/2024  CHAN SHEAHAN 11/09/1946 [id number]  Location: RN Hospital Liaison screened the patient remotely at [facility].  Insurance: [facility] HMO   Jacob Farmer is a 78 y.o. male who is a Primary Care Patient of Allena Katz, Earlie Lou, MD [facility].  The patient was screened for readmission hospitalization with noted extreme risk score for unplanned readmission risk with 3 IP in 6 months.  The patient was assessed for potential Care Management service needs for post hospital transition for care coordination. Review of patient's electronic medical record reveals patient was admitted with SIRS (Systemic inflammatory response syndrome). No therapy recommendation post hospital discharge today. Liaison spoke with the spouse who verified PCP as Dr. Allena Katz.  Spouse states she cancelled apt for today and rescheduled in May due to his hospitalization. Liaison requested the spouse to call PCP office and request a post hospital follow up that maybe a closed date then May. Also introduced VBCI care management services and offer a post-hospital prevention readmission follow up call (receptive). Due to pt's risk liaison will make a referral for VBCI care management services.   Plan: [facility] Liaison will continue to follow progress and disposition to asess for post hospital community care coordination/management needs.  Referral request for community care coordination: Liaison will make a referral for care management services for post hospital prevention readmission calls.   VBCI Care Management/Population Health does not replace or interfere with any arrangements made by the Inpatient Transition of Care team.   For questions contact:   Elliot Cousin, RN, BSN Hospital Liaison [place]   [facility], Population Health Office Hours MTWF  8:00 am-6:00 pm Direct Dial: [phone]  mobile [email or web address]

## 2024-02-17 NOTE — Discharge Summary (Signed)
 Physician Discharge Summary   Patient: Jacob Farmer MRN: [id number] DOB: [birth year]  Admit date:     02/13/2024  Discharge date: 02/17/24  Discharge Physician: Jacob Farmer   PCP: Jacob Halon, MD   Recommendations at discharge:  Repeat CBC to follow hemoglobin trend and WBCs stability. Reassess blood pressure and adjust antihypertensive treatment as required. Continue patient follow-up with vascular surgery as already arranged.  Discharge Diagnoses: Principal Problem:   SIRS (systemic inflammatory response syndrome) (HCC) Active Problems:   Mixed hyperlipidemia   Essential hypertension   Paroxysmal atrial fibrillation (HCC)   Generalized weakness   ESRD on dialysis (HCC)   History of stroke   Hyponatremia   Elevated troponin   GERD (gastroesophageal reflux disease)  Brief hospital admission narrative: As per H&P written by Dr. Thomes Dinning on 02/13/2024 Jacob Farmer is a 78 y.o. male with medical history significant of hypertension, hyperlipidemia, MI, ESRD on HD (MWF), paroxysmal atrial fibrillation not on anticoagulation due to prior history of GI bleed who presents to the emergency department due to back pain which occurred during dialysis today.  Patient was only able to have 1 hour session of dialysis and he also complained of generalized weakness and shaky legs and was asked to go to the ED for further evaluation, instead, patient went home and rested on his recliner, he initially felt better, but legs were still weak and needed assistance to get out of chair, so he decided to go to the ED for further evaluation and management.   ED Course:  In the emergency department, respiratory rate was 24, other vital signs were within normal range.  Workup in the ED showed WBC of 20.4 hemoglobin 9.7, hematocrit 29.7, MCV 92.7, platelets 167.  BMP 130, potassium 3.5, chloride 93, bicarb 27, glucose , BUN 35, creatinine 5.43, GFR 10.  Troponin 33 > 38.  Urinalysis was unimpressive for  UTI, lactic acid was normal..  Influenza A, B, SARS coronavirus 2, RSV was negative. MRI brain without contrast showed no acute intracranial abnormality Chest x-ray showed no evidence of acute cardiopulmonary process Patient was empirically treated with IV Zosyn for presumed bacteremia Hospitalist was asked admit patient for further evaluation and management.  Assessment and Plan: SIRS/concerns for early sepsis and infection process -Patient failed to meet sepsis criteria due to lack of source of infection -Continue supportive care and continue to maintain adequate hydration. -Procalcitonin above 33 suggesting concerns for systemic infection at time of admission -Cultures without any growth for 72 hours prior to discharge. -Patient's WBCs within normal limits at discharge -Patient received 3 days of IV antibiotics and then was watch off antibiotic for 24 hours prior to discharge. -At this moment planning to stop antibiotics and observe patient for the next 24 hours without antibiotic treatment. -Patient will follow-up with PCP in the next 7 days and will recommend repeat CBC to follow a stability.   ESRD -Case discussed with nephrology service; partial treatment prior to admission on Friday (02/13/2024). -Safe to resume hemodialysis again on Monday, 02/16/2024.   Generalized weakness/back pain -Continue as needed analgesia -PT/OT evaluation requested -Lumbar MRI not demonstrating discitis or abscesses; progression of known spondylolysis process without nerve impingement.   Hyponatremia -In the setting of ESRD -Continue following electrolytes trend -Expected correction with dialysis treatment.   Elevated troponin -No significant (33 >> 38) in a patient with chronic dialysis and renal failure. -Patient denies chest pain or shortness of breath -EKG without acute ischemic changes. -No further intervention warranted  Essential hypertension -Continue amlodipine -Heart healthy diet  discussed with patient.   GERD -Continue PPI.   Paroxysmal atrial fibrillation -No receiving any rate control agents at home -Currently sinus rhythm -Patient denies palpitation -Continue Eliquis for secondary prevention.   Mixed hyperlipidemia -Continue statin.   History of stroke -Continue risk factor modification -Chronically on Eliquis and baby aspirin for secondary prevention.    Consultants: Nephrology service. Procedures performed: See below for x-ray report. Disposition: Home Diet recommendation: Heart healthy/low-sodium diet.  DISCHARGE MEDICATION: Allergies as of 02/17/2024   No Known Allergies      Medication List     TAKE these medications    allopurinol 100 MG tablet Commonly known as: ZYLOPRIM Take 50 mg by mouth every other day.   amLODipine 5 MG tablet Commonly known as: NORVASC Take 5 mg by mouth in the morning.   apixaban 2.5 MG Tabs tablet Commonly known as: ELIQUIS Take 1 tablet (2.5 mg total) by mouth 2 (two) times daily.   aspirin EC 81 MG tablet Take 81 mg by mouth in the morning. Swallow whole.   calcitRIOL 0.25 MCG capsule Commonly known as: ROCALTROL Take 0.25 mcg by mouth every Monday, Wednesday, and Friday. At dialysis   Dupixent 300 MG/2ML Soaj Generic drug: Dupilumab Inject 300 mg into the skin every 14 (fourteen) days. Starting at day 15 for maintenance.   ELIGARD [place] Inject 1 Dose into the skin every 4 (four) months.   folic acid 400 MCG tablet Commonly known as: FOLVITE Take 400 mcg by mouth in the morning.   leflunomide 10 MG tablet Commonly known as: ARAVA Take 1 tablet (10 mg total) by mouth daily.   pantoprazole 40 MG tablet Commonly known as: PROTONIX TAKE 1 TABLET TWICE DAILY   Renvela 800 MG tablet Generic drug: sevelamer carbonate Take 800 mg by mouth 2 (two) times daily with a meal.   rosuvastatin 40 MG tablet Commonly known as: CRESTOR Take 1 tablet (40 mg total) by mouth daily. What changed: when  to take this   triamcinolone cream 0.1 % Commonly known as: KENALOG Apply 1 Application topically daily as needed (irritation).        Follow-up Information     Jacob Halon, MD. Schedule an appointment as soon as possible for a visit in 2 week(s).   Specialty: Internal Medicine Contact information: [address] [place] [address] [phone]                Discharge Exam: Ceasar Mons Weights   02/13/24 1622 02/16/24 1730  Weight: 71.7 kg 68.8 kg   General exam: Alert, awake, oriented x 3; afebrile, in no acute distress. Respiratory system: Good saturation on room air. Cardiovascular system:RRR. No rubs or gallops; no JVD. Gastrointestinal system: Abdomen is nondistended, soft and nontender. No organomegaly or masses felt. Normal bowel sounds heard. Central nervous system: Alert and oriented. No focal neurological deficits. Extremities: No cyanosis or clubbing. Skin: No petechiae; right subclavian dialysis catheter in place without signs of infection. Psychiatry: Judgement and insight appear normal. Mood & affect appropriate.   Condition at discharge: Stable and improved.  The results of significant diagnostics from this hospitalization (including imaging, microbiology, ancillary and laboratory) are listed below for reference.   Imaging Studies: MR LUMBAR SPINE WO CONTRAST Result Date: 02/14/2024 CLINICAL DATA:  Low back pain, immunocompromised. History of end-stage renal disease on hemodialysis, atrial fibrillation and GI bleed. Generalized weakness. EXAM: MRI LUMBAR SPINE WITHOUT CONTRAST TECHNIQUE: Multiplanar, multisequence MR imaging of  the lumbar spine was performed. No intravenous contrast was administered. COMPARISON:  MRI lumbar spine 10/17/2016. Radiographs 10/03/2016. Pylarify PET-CT 06/26/2023. FINDINGS: Segmentation: Conventional anatomy assumed, with the last open disc space designated L5-S1. S1 is slightly transitional.Concordant with prior imaging.  Alignment:  Minimal retrolisthesis at L2-3 and L5-S1. Vertebrae: No worrisome osseous lesion, acute fracture or pars defect. There are progressive endplate degenerative changes at L2-3 with endplate edema asymmetric to the left. No evidence of discitis or osteomyelitis. The visualized sacroiliac joints appear unremarkable. Conus medullaris: Extends to the T12-L1 level. The conus and cauda equina appear normal. Paraspinal and other soft tissues: No significant paraspinal findings. Chronic bilateral renal atrophy. Extrahepatic biliary dilatation with possible sludge or small stones in the common bile duct. Diffuse aortic atherosclerosis with eccentric aneurysm of the infrarenal aorta posterolaterally on the left, similar to prior PET-CT. The greatest diameter of the aorta including the aneurysm at this level is 4.8 cm. Disc levels: Sagittal images demonstrate no significant disc space findings within the visualized lower thoracic spine. L1-2: Normal interspace. L2-3: Significantly progressive loss of disc height with annular disc bulging and endplate osteophytes. As above, progressive endplate degenerative changes asymmetric to the left. There is mild facet and ligamentous hypertrophy. These factors contribute to mildly progressive lateral recess and foraminal narrowing bilaterally but no definite nerve root encroachment. The spinal canal is patent. L3-4: Preserved disc height with mild disc bulging eccentric to the right. Mild facet and ligamentous hypertrophy. Unchanged mild asymmetric narrowing of the right lateral recess. No evidence of nerve root encroachment. L4-5: Preserved disc height with stable mild disc bulging, facet and ligamentous hypertrophy. No significant spinal stenosis or nerve root encroachment. L5-S1: Chronic loss of disc height with annular disc bulging and endplate osteophytes. Mild facet hypertrophy. The spinal canal and lateral recesses are patent. Chronic moderate foraminal narrowing appears  unchanged. IMPRESSION: 1. No acute findings or clear explanation for the patient's symptoms. 2. 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 No definite nerve root encroachment. 3. Stable chronic moderate foraminal narrowing at L5-S1. 4. No acute osseous findings or significant spinal stenosis. 5. 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 Recommend CTA or MRA, as appropriate, in 12 months and referral to vascular specialist. Reference: Journal of Vascular Surgery 67.1 (2018): 2-77. J Am Coll Radiol [phone]. 6. Extrahepatic biliary dilatation with possible sludge or small stones in the common bile duct. Correlate with liver function tests and consider MRCP if clinically warranted. Electronically Signed   By: Carey Bullocks M.D.   On: 02/14/2024 13:50   MR BRAIN WO CONTRAST Result Date: 02/13/2024 CLINICAL DATA:  Initial evaluation for acute headache, neuro deficit, dizziness. EXAM: MRI HEAD WITHOUT CONTRAST TECHNIQUE: Multiplanar, multiecho pulse sequences of the brain and surrounding structures were obtained without intravenous contrast. COMPARISON:  CT from 09/13/2023 and MRI from 09/12/2023. FINDINGS: Brain: Mild age-related cerebral atrophy. Patchy T2/FLAIR hyperintensity involving the periventricular deep white matter both cerebral hemispheres, consistent with chronic small vessel ischemic disease, mild in nature. Susceptibility artifact with mild encephalomalacia and gliosis involving the posterior right frontoparietal region, consistent with prior hemorrhage seen at this location. Small focus of chronic hemosiderin staining at the left occipital lobe noted, stable. No evidence for acute or subacute infarct. No other acute or chronic intracranial blood products. No mass lesion, midline shift or mass effect. No hydrocephalus or extra-axial fluid collection. Pituitary gland and suprasellar region  within normal limits. Vascular: Irregular flow void  within the left V3 segment as it courses into the cranial vault, stable from prior. Major intracranial vascular flow voids are otherwise maintained. Skull and upper cervical spine: Craniocervical junction within normal limits. Bone marrow signal intensity heterogeneous without worrisome osseous lesion. No scalp soft tissue abnormality. 2.8 cm T1 hypointense lesion partially visualized within the left upper posterior neck (series 9, image 22), indeterminate. Sinuses/Orbits: Prior bilateral ocular lens replacement. Paranasal sinuses are largely clear. Trace bilateral mastoid effusions, of doubtful significance. Other: None. IMPRESSION: 1. No acute intracranial abnormality. 2. Sequelae of prior hemorrhage involving the posterior right frontoparietal region. 3. Underlying mild age-related cerebral atrophy with chronic small vessel ischemic disease. 4. 2.8 cm T1 hypointense lesion partially visualized within the left upper posterior neck, indeterminate. Correlation with physical exam recommended. Electronically Signed   By: Rise Mu M.D.   On: 02/13/2024 21:50   DG Chest Port 1 View Result Date: 02/13/2024 CLINICAL DATA:  Weakness and back pain at dialysis today. EXAM: PORTABLE CHEST 1 VIEW COMPARISON:  Cardiac CT 12/18/2023. Chest radiographs 02/15/2023 and 02/13/2023. FINDINGS: 1629 hours. Right IJ hemodialysis catheter projects to the mid right atrium. The heart size and mediastinal contours are stable. The lungs are clear. No pleural effusion or pneumothorax. The bones appear unremarkable. Telemetry leads overlie the chest. IMPRESSION: No evidence of acute cardiopulmonary process. Right IJ hemodialysis catheter in place. Electronically Signed   By: Carey Bullocks M.D.   On: 02/13/2024 16:52    Microbiology: Results for orders placed or performed during the hospital encounter of 02/13/24  Resp panel by RT-PCR (RSV, Flu A&B, Covid) Anterior Nasal  Swab     Status: None   Collection Time: 02/13/24  4:19 PM   Specimen: Anterior Nasal Swab  Result Value Ref Range Status   SARS Coronavirus 2 by RT PCR NEGATIVE NEGATIVE Final    Comment: (NOTE) SARS-CoV-2 target nucleic acids are NOT DETECTED.  The SARS-CoV-2 RNA is generally detectable in upper respiratory specimens during the acute phase of infection. The lowest concentration of SARS-CoV-2 viral copies this assay can detect is 138 copies/mL. A negative result does not preclude SARS-Cov-2 infection and should not be used as the sole basis for treatment or other patient management decisions. A negative result may occur with  improper specimen collection/handling, submission of specimen other than nasopharyngeal swab, presence of viral mutation(s) within the areas targeted by this assay, and inadequate number of viral copies(<138 copies/mL). A negative result must be combined with clinical observations, patient history, and epidemiological information. The expected result is Negative.  Fact Sheet for Patients:  BloggerCourse.com  Fact Sheet for Healthcare Providers:  SeriousBroker.it  This test is no t yet approved or cleared by the [place] FDA and  has been authorized for detection and/or diagnosis of SARS-CoV-2 by FDA under an Emergency Use Authorization (EUA). This EUA will remain  in effect (meaning this test can be used) for the duration of the COVID-19 declaration under Section 564(b)(1) of the Act, 21 U.S.C.section 360bbb-3(b)(1), unless the authorization is terminated  or revoked sooner.       Influenza A by PCR NEGATIVE NEGATIVE Final   Influenza B by PCR NEGATIVE NEGATIVE Final    Comment: (NOTE) The Xpert Xpress SARS-CoV-2/FLU/RSV plus assay is intended as an aid in the diagnosis of influenza from Nasopharyngeal swab specimens and should not be used as a sole basis for treatment. Nasal washings and aspirates  are unacceptable for Xpert Xpress SARS-CoV-2/FLU/RSV testing.  Fact Sheet for Patients: BloggerCourse.com  Fact Sheet for Healthcare Providers: SeriousBroker.it  This test is not yet approved or cleared by the [place] FDA and has been authorized for detection and/or diagnosis of SARS-CoV-2 by FDA under an Emergency Use Authorization (EUA). This EUA will remain in effect (meaning this test can be used) for the duration of the COVID-19 declaration under Section 564(b)(1) of the Act, 21 U.S.C. section 360bbb-3(b)(1), unless the authorization is terminated or revoked.     Resp Syncytial Virus by PCR NEGATIVE NEGATIVE Final    Comment: (NOTE) Fact Sheet for Patients: BloggerCourse.com  Fact Sheet for Healthcare Providers: SeriousBroker.it  This test is not yet approved or cleared by the [place] FDA and has been authorized for detection and/or diagnosis of SARS-CoV-2 by FDA under an Emergency Use Authorization (EUA). This EUA will remain in effect (meaning this test can be used) for the duration of the COVID-19 declaration under Section 564(b)(1) of the Act, 21 U.S.C. section 360bbb-3(b)(1), unless the authorization is terminated or revoked.  Performed at [facility], [address] [place], [place] [address]   Culture, blood (routine x 2)     Status: None (Preliminary result)   Collection Time: 02/13/24  5:32 PM   Specimen: BLOOD  Result Value Ref Range Status   Specimen Description BLOOD BLOOD RIGHT ARM  Final   Special Requests NONE  Final   Culture   Final    NO GROWTH 4 DAYS Performed at [facility], [address], [place], [place] [address]    Report Status PENDING  Incomplete  Culture, blood (routine x 2)     Status: None (Preliminary result)   Collection Time: 02/13/24  5:32 PM   Specimen: BLOOD  Result Value Ref Range Status   Specimen Description BLOOD  BLOOD RIGHT HAND  Final   Special Requests NONE  Final   Culture   Final    NO GROWTH 4 DAYS Performed at [facility], [address], [place], [place] [address]    Report Status PENDING  Incomplete    Labs: CBC: Recent Labs  Lab 02/13/24 1637 02/14/24 0444 02/15/24 0423 02/17/24 0259  WBC 20.4* 20.7* 14.1* 8.0  NEUTROABS 17.4*  --   --   --   HGB 9.7* 8.7* 8.1* 8.9*  HCT 29.3* 27.6* 25.7* 28.6*  MCV 92.7 93.9 94.5 95.3  PLT 167 150 153 195   Basic Metabolic Panel: Recent Labs  Lab 02/13/24 1637 02/14/24 0444 02/15/24 0423 02/16/24 0840 02/17/24 0259  NA 130* 132* 132* 138 136  K 3.5 3.9 3.8 3.6 3.5  CL 93* 91* 94* 94* 98  CO2 27 25 23 24 30   GLUCOSE 99 100* 103* 96 142*  BUN 35* 49* 65* 74* 24*  CREATININE 5.43* 6.53* 8.07* 9.50* 4.48*  CALCIUM 8.6* 8.7* 8.4* 8.3* 8.3*  MG  --  2.1  --   --   --   PHOS  --  5.0*  --  5.9* 2.8   Liver Function Tests: Recent Labs  Lab 02/14/24 0444 02/16/24 0840 02/17/24 0259  AST 29  --   --   ALT 16  --   --   ALKPHOS 77  --   --   BILITOT 0.8  --   --   PROT 6.1*  --   --   ALBUMIN 2.9* 2.5* 2.7*   CBG: Recent Labs  Lab 02/17/24 0724  GLUCAP 119*    Discharge time spent: greater than 30 minutes.  Signed: Vassie Loll, MD [facility] 02/17/2024

## 2024-02-17 NOTE — Plan of Care (Signed)

## 2024-02-17 NOTE — Consult Note (Signed)
 Jacob Farmer Progress Note   Subjective:   Tolerated HD with 2L UF yesterday. Slept poorly.  No fevers, feeling fine.  Wife bedside. Requesting d/c today.    Objective Vitals:   02/16/24 2200 02/16/24 2219 02/16/24 2226 02/17/24 0458  BP: 135/79 139/75 (!) 144/70 102/77  Pulse: 79 80 86 78  Resp: 16 17 (!) 23 16  Temp:    98.7 F (37.1 C)  TempSrc:    Oral  SpO2: 100% 100% 99% 97%  Weight:      Height:       Physical Exam General: lying flat in bed looking comfortable Heart:RRR Lungs: clear Abdomen: soft, thin Extremities: no edema, LUE new AVF incision c/d/I, +t/b Dialysis Access: RIJ [facility] c/d/i  Additional Objective Labs: Basic Metabolic Panel: Recent Labs  Lab 02/14/24 0444 02/15/24 0423 02/16/24 0840 02/17/24 0259  NA 132* 132* 138 136  K 3.9 3.8 3.6 3.5  CL 91* 94* 94* 98  CO2 25 23 24 30   GLUCOSE 100* 103* 96 142*  BUN 49* 65* 74* 24*  CREATININE 6.53* 8.07* 9.50* 4.48*  CALCIUM 8.7* 8.4* 8.3* 8.3*  PHOS 5.0*  --  5.9* 2.8   Liver Function Tests: Recent Labs  Lab 02/14/24 0444 02/16/24 0840 02/17/24 0259  AST 29  --   --   ALT 16  --   --   ALKPHOS 77  --   --   BILITOT 0.8  --   --   PROT 6.1*  --   --   ALBUMIN 2.9* 2.5* 2.7*   No results for input(s): "LIPASE", "AMYLASE" in the last 168 hours. CBC: Recent Labs  Lab 02/13/24 1637 02/14/24 0444 02/15/24 0423 02/17/24 0259  WBC 20.4* 20.7* 14.1* 8.0  NEUTROABS 17.4*  --   --   --   HGB 9.7* 8.7* 8.1* 8.9*  HCT 29.3* 27.6* 25.7* 28.6*  MCV 92.7 93.9 94.5 95.3  PLT 167 150 153 195   Blood Culture    Component Value Date/Time   SDES BLOOD BLOOD RIGHT ARM 02/13/2024 1732   SDES BLOOD BLOOD RIGHT HAND 02/13/2024 1732   SPECREQUEST NONE 02/13/2024 1732   SPECREQUEST NONE 02/13/2024 1732   CULT  02/13/2024 1732    NO GROWTH 4 DAYS Performed at [facility], [address], [place], [place] [address]    CULT  02/13/2024 1732    NO GROWTH 4 DAYS Performed at [facility], [address] [place], [place] [address]    REPTSTATUS PENDING 02/13/2024 1732   REPTSTATUS PENDING 02/13/2024 1732    Cardiac Enzymes: No results for input(s): "CKTOTAL", "CKMB", "CKMBINDEX", "TROPONINI" in the last 168 hours. CBG: Recent Labs  Lab 02/17/24 0724  GLUCAP 119*   Iron Studies: No results for input(s): "IRON", "TIBC", "TRANSFERRIN", "FERRITIN" in the last 72 hours. @lablastinr3 @ Studies/Results: No results found. Medications:   amLODipine  5 mg Oral Daily   apixaban  2.5 mg Oral BID   aspirin EC  81 mg Oral Daily   calcitRIOL  0.25 mcg Oral Q M,W,F-HD   Chlorhexidine Gluconate Cloth  6 each Topical Daily   Chlorhexidine Gluconate Cloth  6 each Topical Q0600   darbepoetin (ARANESP) injection - DIALYSIS  60 mcg Subcutaneous Q Mon-1800   folic acid  500 mcg Oral Daily   pantoprazole  40 mg Oral BID   rosuvastatin  40 mg Oral QHS   sevelamer carbonate  800 mg Oral BID WC   sodium chloride flush  10-40 mL  Intracatheter Q12H   Dialysis Orders: [facility] (Dr. Wolfgang Phoenix). RIJ [facility] for the access.  MWF 4hrs 400/800 2K 2.5 Ca EDW 69.5 kg Venofer 50mg  q2wks Mircera 75 mcg q2wks Calcitriol 0.50mcg qHD Heparin 2000 unit bolus   Assessment/Plan:  SIRS/concern for early sepsis - on IV antibiotics per primary svc.  Cultures negative to date.  WBC has improved to 8, he's afebrile w/o localizing infection symptoms.  MRI without evidence of infection.  HD catheter without evidence of infection or tunnel drainage.  ESRD -  continue with HD on MWF.  Next tomorrow if remains inpatient.  Hypertension/volume  - stable  Anemia  -  Hgb stable in 8s, ESA qmon  Metabolic bone disease -   continue with home meds  Nutrition -  renal diet.  Generalized weakness and back pain - continue with PT/OT.  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  Site does not appear infected.  F/u with Dr. Randie Heinz.  Dispo: suspect he can d/c today, up to primary.  Estill Bakes  MD 02/17/2024, 8:14 AM  [facility] Pager: [phone]

## 2024-02-18 ENCOUNTER — Telehealth: Payer: Self-pay | Admitting: *Deleted

## 2024-02-18 DIAGNOSIS — N2581 Secondary hyperparathyroidism of renal origin: Secondary | ICD-10-CM | POA: Diagnosis not present

## 2024-02-18 DIAGNOSIS — N186 End stage renal disease: Secondary | ICD-10-CM | POA: Diagnosis not present

## 2024-02-18 DIAGNOSIS — Z992 Dependence on renal dialysis: Secondary | ICD-10-CM | POA: Diagnosis not present

## 2024-02-18 LAB — CULTURE, BLOOD (ROUTINE X 2)
Culture: NO GROWTH
Culture: NO GROWTH

## 2024-02-18 NOTE — Transitions of Care (Post Inpatient/ED Visit) (Signed)
 02/18/2024  Name: Jacob Farmer MRN: [id number] DOB: 22-Mar-1946  Today's TOC FU Call Status: Today's TOC FU Call Status:: Successful TOC FU Call Completed TOC FU Call Complete Date: 02/18/24 Patient's Name and Date of Birth confirmed.  Transition Care Management Follow-up Telephone Call Date of Discharge: 02/17/24 [facility] Type of Discharge: Inpatient Admission Primary Inpatient Discharge Diagnosis:: systemic inflammatory response syndrome How have you been since you were released from the hospital?: Better (just feels tired) Any questions or concerns?: No  Items Reviewed: Did you receive and understand the discharge instructions provided?: Yes Medications obtained,verified, and reconciled?: Yes (Medications Reviewed) Any new allergies since your discharge?: No Dietary orders reviewed?: Yes Type of Diet Ordered:: heart healthy/low-sodium diet Do you have support at home?: Yes People in Home: spouse Name of Support/Comfort Primary Source: [name]  Medications Reviewed Today: Medications Reviewed Today     Reviewed by Luella Cook, RN (Case Manager) on 02/18/24 at [phone]  Med List Status: <None>   Medication Order Taking? Sig Documenting Provider Last Dose Status Informant  allopurinol (ZYLOPRIM) 100 MG tablet [id number] Yes Take 50 mg by mouth every other day. [provider] Taking Active Spouse/Significant Other  amLODipine (NORVASC) 5 MG tablet [id number] Yes Take 5 mg by mouth in the morning. [provider] Taking Active Spouse/Significant Other  apixaban (ELIQUIS) 2.5 MG TABS tablet [id number] Yes Take 1 tablet (2.5 mg total) by mouth 2 (two) times daily. Nobie Putnam, MD Taking Active Spouse/Significant Other  aspirin EC 81 MG tablet [id number] Yes Take 81 mg by mouth in the morning. Swallow whole. [provider] Taking Active Spouse/Significant Other  calcitRIOL (ROCALTROL) 0.25 MCG capsule [id number] Yes Take 0.25  mcg by mouth every Monday, Wednesday, and Friday. At dialysis [provider] Taking Active Spouse/Significant Other           Med Note Elesa Massed, Illene Labrador   Fri Feb 13, 2024  4:52 PM)    Dupilumab (DUPIXENT) 300 MG/2ML SOAJ [id number] Yes Inject 300 mg into the skin every 14 (fourteen) days. Starting at day 15 for maintenance. Elie Goody, MD Taking Active Spouse/Significant Other  folic acid (FOLVITE) 400 MCG tablet [id number] Yes Take 400 mcg by mouth in the morning. [provider] Taking Active Spouse/Significant Other  leflunomide (ARAVA) 10 MG tablet [id number] Yes Take 1 tablet (10 mg total) by mouth daily. Gearldine Bienenstock, PA-C Taking Active Spouse/Significant Other  Leuprolide Acetate [facility]) [id number] Yes Inject 1 Dose into the skin every 4 (four) months. [provider] Taking Active Spouse/Significant Other           Med Note Hetty Ely Jan 21, 2024  2:08 PM) Last received:01/20/24  pantoprazole (PROTONIX) 40 MG tablet [id number] Yes TAKE 1 TABLET TWICE DAILY Dolores Frame, MD Taking Active   RENVELA 800 MG tablet [id number] Yes Take 800 mg by mouth 2 (two) times daily with a meal. [provider] Taking Active Spouse/Significant Other  rosuvastatin (CRESTOR) 40 MG tablet [id number] Yes Take 1 tablet (40 mg total) by mouth daily.  Patient taking differently: Take 40 mg by mouth every evening.   Ellsworth Lennox, PA-C Taking Active Spouse/Significant Other  triamcinolone cream (KENALOG) 0.1 % [id number] Yes Apply 1 Application topically daily as needed (irritation). Vassie Loll, MD Taking Active             Home Care and Equipment/Supplies: Were Home Health Services Ordered?: NA Any new equipment or medical supplies  ordered?: NA  Functional Questionnaire: Do you need assistance with bathing/showering or dressing?: No Do you need assistance with meal preparation?: Yes Do you need assistance with eating?:  No Do you have difficulty maintaining continence: No Do you need assistance with getting out of bed/getting out of a chair/moving?: No Do you have difficulty managing or taking your medications?: No  Follow up appointments reviewed: PCP Follow-up appointment confirmed?: Yes Date of PCP follow-up appointment?: 03/02/24 Follow-up Provider: Dr Allena Katz [facility] Follow-up appointment confirmed?: Yes Date of Specialist follow-up appointment?: 03/11/24 Follow-Up Specialty Provider:: Vein and vascular specialist Do you need transportation to your follow-up appointment?: No Do you understand care options if your condition(s) worsen?: Yes-patient verbalized understanding  SDOH Interventions Today    Flowsheet Row Most Recent Value  SDOH Interventions   Food Insecurity Interventions Intervention Not Indicated  Housing Interventions Intervention Not Indicated  Transportation Interventions Intervention Not Indicated, Patient Resources (Friends/Family)  [wife carolyn and sometimes his brother]  Utilities Interventions Intervention Not Indicated      Interventions Today    Flowsheet Row Most Recent Value  Chronic Disease   Chronic disease during today's visit Other, Chronic Kidney Disease/End Stage Renal Disease (ESRD)  [systemic inflammatory response syndrome]  General Interventions   General Interventions Discussed/Reviewed General Interventions Discussed, General Interventions Reviewed, Doctor Visits  Doctor Visits Discussed/Reviewed Specialist, PCP, Doctor Visits Reviewed, Doctor Visits Discussed  PCP/Specialist Visits Compliance with follow-up visit  Nutrition Interventions   Nutrition Discussed/Reviewed Nutrition Discussed  Pharmacy Interventions   Pharmacy Dicussed/Reviewed Pharmacy Topics Discussed, Pharmacy Topics Reviewed      Declined TOC follow up calls  Gean Maidens BSN RN [facility] Health Care Management  Coordinator [email or web address] Direct Dial: [phone]  Fax: [phone] Website: [email or web address]

## 2024-02-20 ENCOUNTER — Other Ambulatory Visit: Payer: Self-pay | Admitting: Physician Assistant

## 2024-02-20 ENCOUNTER — Other Ambulatory Visit: Payer: Self-pay | Admitting: Internal Medicine

## 2024-02-20 ENCOUNTER — Other Ambulatory Visit (INDEPENDENT_AMBULATORY_CARE_PROVIDER_SITE_OTHER): Payer: Self-pay | Admitting: Gastroenterology

## 2024-02-20 DIAGNOSIS — N2581 Secondary hyperparathyroidism of renal origin: Secondary | ICD-10-CM | POA: Diagnosis not present

## 2024-02-20 DIAGNOSIS — N186 End stage renal disease: Secondary | ICD-10-CM | POA: Diagnosis not present

## 2024-02-20 DIAGNOSIS — M0600 Rheumatoid arthritis without rheumatoid factor, unspecified site: Secondary | ICD-10-CM

## 2024-02-20 DIAGNOSIS — Z992 Dependence on renal dialysis: Secondary | ICD-10-CM | POA: Diagnosis not present

## 2024-02-23 DIAGNOSIS — N2581 Secondary hyperparathyroidism of renal origin: Secondary | ICD-10-CM | POA: Diagnosis not present

## 2024-02-23 DIAGNOSIS — N186 End stage renal disease: Secondary | ICD-10-CM | POA: Diagnosis not present

## 2024-02-23 DIAGNOSIS — Z992 Dependence on renal dialysis: Secondary | ICD-10-CM | POA: Diagnosis not present

## 2024-02-23 NOTE — Telephone Encounter (Signed)
 Called patient because med list and ov note has he takes pantoprazole. Pt states he is not taking omeprazole and he is taking pantoprazole. Will cancel rx

## 2024-02-25 DIAGNOSIS — N2581 Secondary hyperparathyroidism of renal origin: Secondary | ICD-10-CM | POA: Diagnosis not present

## 2024-02-25 DIAGNOSIS — Z992 Dependence on renal dialysis: Secondary | ICD-10-CM | POA: Diagnosis not present

## 2024-02-25 DIAGNOSIS — N186 End stage renal disease: Secondary | ICD-10-CM | POA: Diagnosis not present

## 2024-02-27 DIAGNOSIS — N2581 Secondary hyperparathyroidism of renal origin: Secondary | ICD-10-CM | POA: Diagnosis not present

## 2024-02-27 DIAGNOSIS — N186 End stage renal disease: Secondary | ICD-10-CM | POA: Diagnosis not present

## 2024-02-27 DIAGNOSIS — Z992 Dependence on renal dialysis: Secondary | ICD-10-CM | POA: Diagnosis not present

## 2024-03-01 DIAGNOSIS — N186 End stage renal disease: Secondary | ICD-10-CM | POA: Diagnosis not present

## 2024-03-01 DIAGNOSIS — Z992 Dependence on renal dialysis: Secondary | ICD-10-CM | POA: Diagnosis not present

## 2024-03-01 DIAGNOSIS — N2581 Secondary hyperparathyroidism of renal origin: Secondary | ICD-10-CM | POA: Diagnosis not present

## 2024-03-02 ENCOUNTER — Ambulatory Visit: Payer: Self-pay | Admitting: Internal Medicine

## 2024-03-02 ENCOUNTER — Encounter: Payer: Self-pay | Admitting: Internal Medicine

## 2024-03-02 VITALS — BP 115/61 | HR 66 | Ht 70.0 in | Wt 155.8 lb

## 2024-03-02 DIAGNOSIS — E119 Type 2 diabetes mellitus without complications: Secondary | ICD-10-CM

## 2024-03-02 DIAGNOSIS — N184 Chronic kidney disease, stage 4 (severe): Secondary | ICD-10-CM | POA: Diagnosis not present

## 2024-03-02 DIAGNOSIS — R6510 Systemic inflammatory response syndrome (SIRS) of non-infectious origin without acute organ dysfunction: Secondary | ICD-10-CM | POA: Diagnosis not present

## 2024-03-02 DIAGNOSIS — R531 Weakness: Secondary | ICD-10-CM

## 2024-03-02 DIAGNOSIS — Z09 Encounter for follow-up examination after completed treatment for conditions other than malignant neoplasm: Secondary | ICD-10-CM | POA: Diagnosis not present

## 2024-03-02 DIAGNOSIS — E1122 Type 2 diabetes mellitus with diabetic chronic kidney disease: Secondary | ICD-10-CM

## 2024-03-02 NOTE — Patient Instructions (Signed)
 Please continue to take medications as prescribed.  Please continue to follow low salt diet and ambulate as tolerated.  Please bring dialysis center blood test reports in the next visit.

## 2024-03-02 NOTE — Assessment & Plan Note (Addendum)
 Now resolved Had hypotension and tachycardia during HD and/or post HD disequilibrium syndrome Was given IV antibiotics during recent hospitalization, blood cultures were negative for infection WBC counts had normalized upon discharge Advised to bring blood test reports from HD center

## 2024-03-02 NOTE — Progress Notes (Addendum)
 Established Patient Office Visit  Subjective:  Patient ID: Jacob Farmer, male    DOB: [birth year]  Age: 78 y.o. MRN: [id number]  CC:  Chief Complaint  Patient presents with   Care Management    Follow up from hospital visit , d/c on 02/17/24    HPI CHRISTOHER DRUDGE is a 78 y.o. male with past medical history of CAD status post stent placement, HTN, HLD, RA, ESRD on HD, prostate ca. s/p radiotherapy, gout, cervical spinal stenosis and GERD who presents for follow-up after recent hospitalization from 02/13/24-02/17/24.  Patient was only able to have 1 hour session of dialysis and he also complained of generalized weakness and shaky legs and was asked to go to the ED for further evaluation, instead, patient went home and rested on his recliner, he initially felt better, but legs were still weak and needed assistance to get out of chair, so he decided to go to the ED for further evaluation and management.  He had leukocytosis and elevated procalcitonin initially, was given empiric Zosyn  for SIRS.  Chest x-ray was negative for any acute process.  UA was negative for UTI.  He was afebrile.  Blood cultures were negative for infection.  Due to lack of source of infection, antibiotics were stopped and he was observed for 24 hours before discharging from the hospital.  Since being discharged, he denies any fever, chills, dyspnea or wheezing.  His WBC count had normalized on the day of discharge.  He has resumed HD without overt discomfort.  He has started taking amlodipine  in the evening on HD days.  Denies dizziness during HD now.    Past Medical History:  Diagnosis Date   Acute CVA (cerebrovascular accident) (HCC) 09/11/2023   CAD (coronary artery disease)    Acute myocardial infarction treated with TPA in 1990; 1999-stents to circumflex and RCA; residual total occlusion of the left anterior descending; normal ejection fraction; stress nuclear in 2001-distal anteroseptal ischemia; normal LV function    CKD (chronic kidney disease) stage 4, GFR 15-29 ml/min (HCC)    First degree heart block    History of gastric ulcer    History of kidney stones    History of MI (myocardial infarction)    1990-  treated w/ TPA   Hyperlipidemia    Hypertension    Jaundice    OA (osteoarthritis)    Prediabetes    Prostate cancer (HCC)    Stage T1c , Gleason 3+4,  PSA 4.7,  vol 24cc--  scheduled for radiative seed implants   Psoriatic arthritis (HCC)    RA (rheumatoid arthritis) (HCC)    Dr. Ronette   Sinus bradycardia    Wears dentures     Past Surgical History:  Procedure Laterality Date   AV FISTULA PLACEMENT Left 01/27/2024   Procedure: CREATION OF LEFT ARM ARTERIOVENOUS (AV) FISTULA;  Surgeon: Sheree Penne Bruckner, MD;  Location: [facility] OR;  Service: Vascular;  Laterality: Left;   BIOPSY  08/27/2019   Procedure: BIOPSY;  Surgeon: Golda Claudis PENNER, MD;  [facility];  Service: Endoscopy;;  gastric   BIOPSY  04/03/2022   Procedure: BIOPSY;  Surgeon: Eartha Angelia Sieving, MD;  [facility];  Service: Gastroenterology;;   BIOPSY  04/21/2023   Procedure: BIOPSY;  Surgeon: Cindie Carlin POUR, DO;  [facility];  Service: Endoscopy;;   callus removal Left 09/13/2022   left foot   CARDIOVASCULAR STRESS TEST  2001  per Dr Juventino clinic note  distal anteroseptal ischemia,  normal LVF   COLONOSCOPY  last one 2006 (approx)   COLONOSCOPY WITH PROPOFOL  N/A 04/03/2022   Procedure: COLONOSCOPY WITH PROPOFOL ;  Surgeon: Eartha Angelia Sieving, MD;  [facility];  Service: Gastroenterology;  Laterality: N/A;  945   CORONARY ANGIOPLASTY WITH STENT PLACEMENT  1999   DES x2  to CFX and RCA/  residual total occlusion LAD,  normal LVEF   ENTEROSCOPY N/A 04/24/2023   Procedure: ENTEROSCOPY;  Surgeon: Eartha Angelia Sieving, MD;  [facility];  Service: Gastroenterology;  Laterality: N/A;   ESOPHAGOGASTRODUODENOSCOPY (EGD) WITH PROPOFOL  N/A  08/27/2019   Procedure: ESOPHAGOGASTRODUODENOSCOPY (EGD) WITH PROPOFOL ;  Surgeon: Golda Claudis PENNER, MD;  [facility];  Service: Endoscopy;  Laterality: N/A;  10:10   ESOPHAGOGASTRODUODENOSCOPY (EGD) WITH PROPOFOL  N/A 04/03/2022   Procedure: ESOPHAGOGASTRODUODENOSCOPY (EGD) WITH PROPOFOL ;  Surgeon: Eartha Angelia Sieving, MD;  [facility];  Service: Gastroenterology;  Laterality: N/A;   ESOPHAGOGASTRODUODENOSCOPY (EGD) WITH PROPOFOL  N/A 02/16/2023   Procedure: ESOPHAGOGASTRODUODENOSCOPY (EGD) WITH PROPOFOL ;  Surgeon: Eartha Angelia Sieving, MD;  [facility];  Service: Gastroenterology;  Laterality: N/A;   ESOPHAGOGASTRODUODENOSCOPY (EGD) WITH PROPOFOL  N/A 04/21/2023   Procedure: ESOPHAGOGASTRODUODENOSCOPY (EGD) WITH PROPOFOL ;  Surgeon: Cindie Carlin POUR, DO;  [facility];  Service: Endoscopy;  Laterality: N/A;   EYE SURGERY Bilateral    cataract   GIVENS CAPSULE STUDY N/A 04/22/2023   Procedure: GIVENS CAPSULE STUDY;  Surgeon: Eartha Angelia Sieving, MD;  [facility];  Service: Gastroenterology;  Laterality: N/A;   HEMOSTASIS CLIP PLACEMENT  04/24/2023   Procedure: HEMOSTASIS CLIP PLACEMENT;  Surgeon: Eartha Angelia Sieving, MD;  [facility];  Service: Gastroenterology;;   HOT HEMOSTASIS  04/03/2022   Procedure: HOT HEMOSTASIS (ARGON PLASMA COAGULATION/BICAP);  Surgeon: Eartha Angelia, Sieving, MD;  [facility];  Service: Gastroenterology;;   HOT HEMOSTASIS  04/24/2023   Procedure: HOT HEMOSTASIS (ARGON PLASMA COAGULATION/BICAP);  Surgeon: Eartha Angelia, Sieving, MD;  [facility];  Service: Gastroenterology;;   IR FLUORO GUIDE CV LINE RIGHT  05/30/2023   IR US  GUIDE VASC ACCESS RIGHT  05/30/2023   POLYPECTOMY  04/03/2022   Procedure: POLYPECTOMY;  Surgeon: Eartha Angelia Sieving, MD;  [facility];  Service: Gastroenterology;;   POSTERIOR CERVICAL FUSION/FORAMINOTOMY N/A 05/26/2017    Procedure: RIGHT C4-5 FORAMINOTOMY WITH EXCISION OF HERNIATED DISC;  Surgeon: Lucilla Lynwood BRAVO, MD;  Location: [facility] OR;  Service: Orthopedics;  Laterality: N/A;   RADIOACTIVE SEED IMPLANT N/A 01/11/2016   Procedure: RADIOACTIVE SEED IMPLANT/BRACHYTHERAPY IMPLANT;  Surgeon: Garnette Shack, MD;  Location: [facility];  Service: Urology;  Laterality: N/A;   73  seeds implanted no seeds founds in bladder   SCLEROTHERAPY  04/24/2023   Procedure: SCLEROTHERAPY;  Surgeon: Eartha Angelia, Sieving, MD;  [facility];  Service: Gastroenterology;;   SUBMUCOSAL TATTOO INJECTION  04/24/2023   Procedure: SUBMUCOSAL TATTOO INJECTION;  Surgeon: Eartha Angelia, Sieving, MD;  [facility];  Service: Gastroenterology;;    Family History  Problem Relation Age of Onset   Heart attack Mother    Coronary artery disease Father    Ulcerative colitis Father    Ulcers Father    Breast cancer Sister    COPD Brother    Pancreatic cancer Brother    Healthy Sister    Diabetes Brother    Cirrhosis Son    Seizures Son     Social History  Socioeconomic History   Marital status: Married    Spouse name: Not on file   Number of children: 2   Years of education: Not on file   Highest education level: Not on file  Occupational History   Occupation: Retired Personnel officer  Tobacco Use   Smoking status: Former    Current packs/day: 0.00    Average packs/day: 1 pack/day for 30.0 years (30.0 ttl pk-yrs)    Types: Cigarettes    Start date: 05/22/1959    Quit date: 05/21/1989    Years since quitting: 34.8    Passive exposure: Never   Smokeless tobacco: Never  Vaping Use   Vaping status: Never Used  Substance and Sexual Activity   Alcohol  use: Yes    Alcohol /week: 3.0 standard drinks of alcohol     Types: 3 Standard drinks or equivalent per week    Comment: 1 glass of liquor, 4 days a week   Drug use: No   Sexual activity: Not on file  Other Topics Concern   Not on file   Social History Narrative   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      Right handed   Wears readers   Drinks 1 cup in mornings   Drinks ginger ale   Social Drivers of Health   Financial Resource Strain: Low Risk  (11/05/2023)   Overall Financial Resource Strain (CARDIA)    Difficulty of Paying Living Expenses: Not very hard  Food Insecurity: No Food Insecurity (02/18/2024)   Hunger Vital Sign    Worried About Running Out of Food in the Last Year: Never true    Ran Out of Food in the Last Year: Never true  Transportation Needs: No Transportation Needs (02/18/2024)   PRAPARE - Administrator, Civil Service (Medical): No    Lack of Transportation (Non-Medical): No  Physical Activity: Sufficiently Active (11/05/2023)   Exercise Vital Sign    Days of Exercise per Week: 5 days    Minutes of Exercise per Session: 30 min  Stress: No Stress Concern Present (11/05/2023)   [employer] of Occupational Health - Occupational Stress Questionnaire    Feeling of Stress : Not at all  Social Connections: Moderately Integrated (02/14/2024)   Social Connection and Isolation Panel [NHANES]    Frequency of Communication with Friends and Family: More than three times a week    Frequency of Social Gatherings with Friends and Family: Three times a week    Attends Religious Services: More than 4 times per year    Active Member of Clubs or Organizations: No    Attends Banker Meetings: Never    Marital Status: Married  Catering manager Violence: Not At Risk (02/18/2024)   Humiliation, Afraid, Rape, and Kick questionnaire    Fear of Current or Ex-Partner: No    Emotionally Abused: No    Physically Abused: No    Sexually Abused: No    Outpatient Medications Prior to Visit  Medication Sig Dispense Refill   allopurinol  (ZYLOPRIM ) 100 MG tablet Take 50 mg by mouth every other day.     amLODipine  (NORVASC ) 5 MG tablet TAKE 1 TABLET EVERY DAY 90 tablet 3   apixaban  (ELIQUIS )  2.5 MG TABS tablet Take 1 tablet (2.5 mg total) by mouth 2 (two) times daily. 60 tablet 11   aspirin  EC 81 MG tablet Take 81 mg by mouth in the morning. Swallow whole.     calcitRIOL  (ROCALTROL ) 0.25 MCG capsule Take 0.25 mcg by mouth every  Monday, Wednesday, and Friday. At dialysis     Dupilumab  (DUPIXENT ) 300 MG/2ML SOAJ Inject 300 mg into the skin every 14 (fourteen) days. Starting at day 15 for maintenance. 12 mL 3   folic acid  (FOLVITE ) 400 MCG tablet Take 400 mcg by mouth in the morning.     leflunomide  (ARAVA ) 10 MG tablet Take 1 tablet (10 mg total) by mouth daily. 90 tablet 0   Leuprolide  Acetate (ELIGARD  [place]) Inject 1 Dose into the skin every 4 (four) months.     pantoprazole  (PROTONIX ) 40 MG tablet TAKE 1 TABLET TWICE DAILY 180 tablet 3   RENVELA  800 MG tablet Take 800 mg by mouth 2 (two) times daily with a meal.     rosuvastatin  (CRESTOR ) 40 MG tablet Take 1 tablet (40 mg total) by mouth daily. (Patient taking differently: Take 40 mg by mouth every evening.) 90 tablet 3   triamcinolone  cream (KENALOG ) 0.1 % Apply 1 Application topically daily as needed (irritation).     No facility-administered medications prior to visit.    No Known Allergies  ROS Review of Systems  Constitutional:  Negative for chills and fever.  HENT:  Negative for congestion and sore throat.   Eyes:  Negative for pain and discharge.  Respiratory:  Negative for cough and shortness of breath.   Cardiovascular:  Negative for chest pain and palpitations.  Gastrointestinal:  Positive for constipation. Negative for diarrhea, nausea and vomiting.  Endocrine: Negative for polydipsia and polyuria.  Genitourinary:  Negative for dysuria and hematuria.  Musculoskeletal:  Positive for arthralgias and back pain. Negative for neck pain and neck stiffness.  Skin:  Positive for rash.  Neurological:  Negative for dizziness, weakness, numbness and headaches.  Psychiatric/Behavioral:  Negative for agitation and behavioral  problems.       Objective:    Physical Exam Vitals reviewed.  Constitutional:      General: He is not in acute distress.    Appearance: He is not diaphoretic.  HENT:     Head: Normocephalic and atraumatic.     Nose: Nose normal.     Mouth/Throat:     Mouth: Mucous membranes are moist.  Eyes:     General: No scleral icterus.    Extraocular Movements: Extraocular movements intact.  Cardiovascular:     Rate and Rhythm: Normal rate and regular rhythm.     Heart sounds: Normal heart sounds. No murmur heard. Pulmonary:     Breath sounds: Normal breath sounds. No wheezing or rales.  Musculoskeletal:     Cervical back: Neck supple. No tenderness.     Right lower leg: No edema.     Left lower leg: No edema.  Skin:    General: Skin is warm.     Findings: Rash (Diffuse eczematous and dry, scaly patches over LE) present.     Comments: Has soft tissue mass over left side of neck, about 2 cm in diameter, nontender Has HD catheter in place over right chest wall-C/D/I  Neurological:     General: No focal deficit present.     Mental Status: He is alert and oriented to person, place, and time.     Sensory: No sensory deficit.     Motor: Weakness (B/l LE - 4/5) present.  Psychiatric:        Mood and Affect: Mood normal.        Behavior: Behavior normal.    BP 115/61   Pulse 66   Ht 5' 10 (1.778 m)  Wt 155 lb 12.8 oz (70.7 kg)   SpO2 97%   BMI 22.35 kg/m  Wt Readings from Last 3 Encounters:  03/02/24 155 lb 12.8 oz (70.7 kg)  02/16/24 151 lb 10.8 oz (68.8 kg)  01/27/24 160 lb (72.6 kg)    Lab Results  Component Value Date   TSH 3.636 02/13/2023   Lab Results  Component Value Date   WBC 8.0 02/17/2024   HGB 8.9 (L) 02/17/2024   HCT 28.6 (L) 02/17/2024   MCV 95.3 02/17/2024   PLT 195 02/17/2024   Lab Results  Component Value Date   NA 136 02/17/2024   K 3.5 02/17/2024   CO2 30 02/17/2024   GLUCOSE 142 (H) 02/17/2024   BUN 24 (H) 02/17/2024   CREATININE 4.48 (H)  02/17/2024   BILITOT 0.8 02/14/2024   ALKPHOS 77 02/14/2024   AST 29 02/14/2024   ALT 16 02/14/2024   PROT 6.1 (L) 02/14/2024   ALBUMIN 2.7 (L) 02/17/2024   CALCIUM  8.3 (L) 02/17/2024   ANIONGAP 8 02/17/2024   EGFR 12 (L) 12/04/2023   Lab Results  Component Value Date   CHOL 123 09/12/2023   Lab Results  Component Value Date   HDL 64 09/12/2023   Lab Results  Component Value Date   LDLCALC 41 09/12/2023   Lab Results  Component Value Date   TRIG 92 09/12/2023   Lab Results  Component Value Date   CHOLHDL 1.9 09/12/2023   Lab Results  Component Value Date   HGBA1C 5.7 02/03/2024      Assessment & Plan:   Problem List Items Addressed This Visit       Endocrine   Type 2 diabetes mellitus with diabetic chronic kidney disease (HCC)   Lab Results  Component Value Date   HGBA1C 5.7 02/03/2024    Diet controlled Advised to follow diabetic diet On statin F/u CMP and lipid panel Diabetic eye exam: Advised to follow up with Ophthalmology for diabetic eye exam  Referred to Podiatry for toenail trimming      Relevant Orders   Ambulatory referral to Podiatry     Other   Generalized weakness - Primary (Chronic)   Likely due to hypotension during HD and/or post HD disequilibrium syndrome Was given IV antibiotics during recent hospitalization, blood cultures were negative for infection Overall feeling better now      Hospital discharge follow-up   Hospital chart reviewed, including discharge summary Medications reconciled and reviewed with the patient in detail      SIRS (systemic inflammatory response syndrome) (HCC)   Now resolved Had hypotension and tachycardia during HD and/or post HD disequilibrium syndrome Was given IV antibiotics during recent hospitalization, blood cultures were negative for infection WBC counts had normalized upon discharge Advised to bring blood test reports from HD center      Other Visit Diagnoses       Encounter for  diabetic foot exam [facility])       Relevant Orders   Ambulatory referral to Podiatry       No orders of the defined types were placed in this encounter.   Follow-up: Return if symptoms worsen or fail to improve.    Suzzane MARLA Blanch, MD

## 2024-03-02 NOTE — Assessment & Plan Note (Addendum)
 Lab Results  Component Value Date   HGBA1C 5.7 02/03/2024    Diet controlled Advised to follow diabetic diet On statin F/u CMP and lipid panel Diabetic eye exam: Advised to follow up with Ophthalmology for diabetic eye exam  Referred to Podiatry for toenail trimming

## 2024-03-02 NOTE — Assessment & Plan Note (Signed)
 Hospital chart reviewed, including discharge summary Medications reconciled and reviewed with the patient in detail

## 2024-03-02 NOTE — Assessment & Plan Note (Signed)
 Likely due to hypotension during HD and/or post HD disequilibrium syndrome Was given IV antibiotics during recent hospitalization, blood cultures were negative for infection Overall feeling better now

## 2024-03-03 ENCOUNTER — Encounter (HOSPITAL_COMMUNITY): Payer: Medicare HMO

## 2024-03-03 ENCOUNTER — Other Ambulatory Visit: Payer: Self-pay | Admitting: *Deleted

## 2024-03-03 DIAGNOSIS — N186 End stage renal disease: Secondary | ICD-10-CM

## 2024-03-03 DIAGNOSIS — Z992 Dependence on renal dialysis: Secondary | ICD-10-CM | POA: Diagnosis not present

## 2024-03-03 DIAGNOSIS — N2581 Secondary hyperparathyroidism of renal origin: Secondary | ICD-10-CM | POA: Diagnosis not present

## 2024-03-05 DIAGNOSIS — N186 End stage renal disease: Secondary | ICD-10-CM | POA: Diagnosis not present

## 2024-03-05 DIAGNOSIS — N2581 Secondary hyperparathyroidism of renal origin: Secondary | ICD-10-CM | POA: Diagnosis not present

## 2024-03-05 DIAGNOSIS — Z992 Dependence on renal dialysis: Secondary | ICD-10-CM | POA: Diagnosis not present

## 2024-03-08 DIAGNOSIS — Z992 Dependence on renal dialysis: Secondary | ICD-10-CM | POA: Diagnosis not present

## 2024-03-08 DIAGNOSIS — N186 End stage renal disease: Secondary | ICD-10-CM | POA: Diagnosis not present

## 2024-03-08 DIAGNOSIS — N2581 Secondary hyperparathyroidism of renal origin: Secondary | ICD-10-CM | POA: Diagnosis not present

## 2024-03-10 DIAGNOSIS — N186 End stage renal disease: Secondary | ICD-10-CM | POA: Diagnosis not present

## 2024-03-10 DIAGNOSIS — N2581 Secondary hyperparathyroidism of renal origin: Secondary | ICD-10-CM | POA: Diagnosis not present

## 2024-03-10 DIAGNOSIS — Z992 Dependence on renal dialysis: Secondary | ICD-10-CM | POA: Diagnosis not present

## 2024-03-11 ENCOUNTER — Ambulatory Visit (HOSPITAL_COMMUNITY)
Admission: RE | Admit: 2024-03-11 | Discharge: 2024-03-11 | Disposition: A | Discharge status: Home / Self Care | Payer: Medicare HMO | Source: Ambulatory Visit | Attending: Vascular Surgery | Admitting: Vascular Surgery

## 2024-03-11 ENCOUNTER — Ambulatory Visit (INDEPENDENT_AMBULATORY_CARE_PROVIDER_SITE_OTHER): Payer: Medicare HMO | Admitting: Physician Assistant

## 2024-03-11 VITALS — BP 150/81 | HR 65 | Temp 97.7°F | Ht 70.0 in | Wt 151.2 lb

## 2024-03-11 DIAGNOSIS — N186 End stage renal disease: Secondary | ICD-10-CM | POA: Diagnosis not present

## 2024-03-11 NOTE — Progress Notes (Signed)
 POST OPERATIVE OFFICE NOTE    CC:  F/u for surgery  HPI:  Jacob Farmer is a 78 y.o. male who is here for postop check.  He recently underwent creation of left brachiocephalic AV fistula on 01/27/2024 by Dr. Randie Heinz.  This was done for permanent dialysis access.  Pt returns today for follow up.  Pt states he has been doing well since surgery.  He denies any issues with his incision such as drainage, erythema, or dehiscence.  He denies any fevers or chills.  He denies any symptoms of steal in the left hand such as weakness, numbness, excessive coldness, or pain.  He is currently dialyzing via right IJ TDC.   No Known Allergies  Current Outpatient Medications  Medication Sig Dispense Refill   allopurinol (ZYLOPRIM) 100 MG tablet Take 50 mg by mouth every other day.     amLODipine (NORVASC) 5 MG tablet TAKE 1 TABLET EVERY DAY 90 tablet 3   apixaban (ELIQUIS) 2.5 MG TABS tablet Take 1 tablet (2.5 mg total) by mouth 2 (two) times daily. 60 tablet 11   aspirin EC 81 MG tablet Take 81 mg by mouth in the morning. Swallow whole.     calcitRIOL (ROCALTROL) 0.25 MCG capsule Take 0.25 mcg by mouth every Monday, Wednesday, and Friday. At dialysis     Dupilumab (DUPIXENT) 300 MG/2ML SOAJ Inject 300 mg into the skin every 14 (fourteen) days. Starting at day 15 for maintenance. 12 mL 3   folic acid (FOLVITE) 400 MCG tablet Take 400 mcg by mouth in the morning.     leflunomide (ARAVA) 10 MG tablet Take 1 tablet (10 mg total) by mouth daily. 90 tablet 0   Leuprolide Acetate (ELIGARD [place]) Inject 1 Dose into the skin every 4 (four) months.     pantoprazole (PROTONIX) 40 MG tablet TAKE 1 TABLET TWICE DAILY 180 tablet 3   RENVELA 800 MG tablet Take 800 mg by mouth 2 (two) times daily with a meal.     rosuvastatin (CRESTOR) 40 MG tablet Take 1 tablet (40 mg total) by mouth daily. (Patient taking differently: Take 40 mg by mouth every evening.) 90 tablet 3   triamcinolone cream (KENALOG) 0.1 % Apply 1 Application  topically daily as needed (irritation).     No current facility-administered medications for this visit.     ROS:  See HPI  Physical Exam:   Incision: Left arm incision well-healed without signs of infection or hematoma Extremities: 2+ left radial pulse.  Left brachiocephalic fistula with good thrill on exam.  There is a palpable decrease in diameter of the fistula around the distal upper arm Neuro: Intact motor and sensation of left upper extremity  Studies: Dialysis Duplex: (03/11/2024) +--------------------+----------+-----------------+--------+  AVF                PSV (cm/s)Flow Vol (mL/min)Comments  +--------------------+----------+-----------------+--------+  Native artery inflow   205           622                 +--------------------+----------+-----------------+--------+  AVF Anastomosis        507                               +--------------------+----------+-----------------+--------+     +------------+----------+-------------+----------+------------------------+   OUTFLOW VEINPSV (cm/s)Diameter (cm)Depth (cm)        Describe           +------------+----------+-------------+----------+------------------------+  Prox UA        191        0.65        0.30                              +------------+----------+-------------+----------+------------------------+   Mid UA      175 / 326     0.57        0.32   competing branch 0.18  cm  +------------+----------+-------------+----------+------------------------+   Dist UA        459        0.50        0.22                              +------------+----------+-------------+----------+------------------------+   AC Fossa    781 / 274     0.26        0.48      change in Diameter      +------------+----------+-------------+----------+------------------------+    Assessment/Plan:  This is a 78 y.o. male who is here for postop check  -The patient recently underwent creation  of left brachiocephalic AV fistula on 2/11 for permanent dialysis access -Duplex demonstrates a slowly maturing fistula with adequate flow volumes of 622 ml/min.  Most of the fistula is not yet large enough in diameter for access.  The fistula is fairly small in the distal upper arm as well -He denies any symptoms of steal in the left hand such as weakness, numbness, excessive coldness, or pain.  He has a 2+ left radial pulse -His left arm incision is well-healed without signs of infection.  His left brachiocephalic fistula has a good thrill and palpation.  It is still somewhat small and has a palpable decrease in diameter in the distal upper arm -I would like to give the patient more time to see if his fistula will mature more in size.  I have encouraged him to exercise his arm.  We can bring the patient back for repeat duplex in 4 weeks.  If his fistula still is not adequate in size, he may require fistulogram   Loel Dubonnet, PA-C [facility] [phone]   Clinic MD:  Karin Lieu

## 2024-03-12 ENCOUNTER — Other Ambulatory Visit: Payer: Self-pay | Admitting: *Deleted

## 2024-03-12 DIAGNOSIS — N186 End stage renal disease: Secondary | ICD-10-CM | POA: Diagnosis not present

## 2024-03-12 DIAGNOSIS — N2581 Secondary hyperparathyroidism of renal origin: Secondary | ICD-10-CM | POA: Diagnosis not present

## 2024-03-12 DIAGNOSIS — Z992 Dependence on renal dialysis: Secondary | ICD-10-CM | POA: Diagnosis not present

## 2024-03-15 DIAGNOSIS — Z992 Dependence on renal dialysis: Secondary | ICD-10-CM | POA: Diagnosis not present

## 2024-03-15 DIAGNOSIS — N2581 Secondary hyperparathyroidism of renal origin: Secondary | ICD-10-CM | POA: Diagnosis not present

## 2024-03-15 DIAGNOSIS — N186 End stage renal disease: Secondary | ICD-10-CM | POA: Diagnosis not present

## 2024-03-16 DIAGNOSIS — Z992 Dependence on renal dialysis: Secondary | ICD-10-CM | POA: Diagnosis not present

## 2024-03-16 DIAGNOSIS — N186 End stage renal disease: Secondary | ICD-10-CM | POA: Diagnosis not present

## 2024-03-17 DIAGNOSIS — N186 End stage renal disease: Secondary | ICD-10-CM | POA: Diagnosis not present

## 2024-03-17 DIAGNOSIS — Z992 Dependence on renal dialysis: Secondary | ICD-10-CM | POA: Diagnosis not present

## 2024-03-17 DIAGNOSIS — N2581 Secondary hyperparathyroidism of renal origin: Secondary | ICD-10-CM | POA: Diagnosis not present

## 2024-03-18 ENCOUNTER — Encounter: Payer: Self-pay | Admitting: Podiatry

## 2024-03-18 ENCOUNTER — Ambulatory Visit: Admitting: Podiatry

## 2024-03-18 DIAGNOSIS — N184 Chronic kidney disease, stage 4 (severe): Secondary | ICD-10-CM

## 2024-03-18 DIAGNOSIS — Z992 Dependence on renal dialysis: Secondary | ICD-10-CM

## 2024-03-18 DIAGNOSIS — M79674 Pain in right toe(s): Secondary | ICD-10-CM

## 2024-03-18 DIAGNOSIS — N186 End stage renal disease: Secondary | ICD-10-CM

## 2024-03-18 DIAGNOSIS — E1122 Type 2 diabetes mellitus with diabetic chronic kidney disease: Secondary | ICD-10-CM

## 2024-03-18 DIAGNOSIS — M79675 Pain in left toe(s): Secondary | ICD-10-CM

## 2024-03-18 DIAGNOSIS — B351 Tinea unguium: Secondary | ICD-10-CM | POA: Insufficient documentation

## 2024-03-18 NOTE — Progress Notes (Signed)
 This patient returns to my office for at risk foot care.  This patient requires this care by a professional since this patient will be at risk due to having diabetes and ESRD. This patient is unable to cut nails himself since the patient cannot reach his nails.These nails are painful walking and wearing shoes. He presents to the office with his wife. This patient presents for at risk foot care today.  General Appearance  Alert, conversant and in no acute stress.  Vascular  Dorsalis pedis and posterior tibial  pulses are palpable  bilaterally.  Capillary return is within normal limits  bilaterally. Temperature is within normal limits  bilaterally.  Neurologic  Senn-Weinstein monofilament wire test within normal limits  bilaterally. Muscle power within normal limits bilaterally.  Nails Thick disfigured discolored nails with subungual debris  from hallux to fifth toes bilaterally. No evidence of bacterial infection or drainage bilaterally.  Orthopedic  No limitations of motion  feet .  No crepitus or effusions noted.  No bony pathology or digital deformities noted.  Skin  normotropic skin with no porokeratosis noted bilaterally.  No signs of infections or ulcers noted.     Onychomycosis  Pain in right toes  Pain in left toes  Consent was obtained for treatment procedures.   Mechanical debridement of nails 1-5  bilaterally performed with a nail nipper.  Filed with dremel without incident.    Return office visit    3 months                  Told patient to return for periodic foot care and evaluation due to potential at risk complications.   Helane Gunther DPM

## 2024-03-19 DIAGNOSIS — Z992 Dependence on renal dialysis: Secondary | ICD-10-CM | POA: Diagnosis not present

## 2024-03-19 DIAGNOSIS — N186 End stage renal disease: Secondary | ICD-10-CM | POA: Diagnosis not present

## 2024-03-19 DIAGNOSIS — N2581 Secondary hyperparathyroidism of renal origin: Secondary | ICD-10-CM | POA: Diagnosis not present

## 2024-03-22 DIAGNOSIS — N186 End stage renal disease: Secondary | ICD-10-CM | POA: Diagnosis not present

## 2024-03-22 DIAGNOSIS — Z992 Dependence on renal dialysis: Secondary | ICD-10-CM | POA: Diagnosis not present

## 2024-03-22 DIAGNOSIS — N2581 Secondary hyperparathyroidism of renal origin: Secondary | ICD-10-CM | POA: Diagnosis not present

## 2024-03-24 DIAGNOSIS — Z992 Dependence on renal dialysis: Secondary | ICD-10-CM | POA: Diagnosis not present

## 2024-03-24 DIAGNOSIS — N2581 Secondary hyperparathyroidism of renal origin: Secondary | ICD-10-CM | POA: Diagnosis not present

## 2024-03-24 DIAGNOSIS — N186 End stage renal disease: Secondary | ICD-10-CM | POA: Diagnosis not present

## 2024-03-26 DIAGNOSIS — N186 End stage renal disease: Secondary | ICD-10-CM | POA: Diagnosis not present

## 2024-03-26 DIAGNOSIS — N2581 Secondary hyperparathyroidism of renal origin: Secondary | ICD-10-CM | POA: Diagnosis not present

## 2024-03-26 DIAGNOSIS — Z992 Dependence on renal dialysis: Secondary | ICD-10-CM | POA: Diagnosis not present

## 2024-03-29 DIAGNOSIS — Z992 Dependence on renal dialysis: Secondary | ICD-10-CM | POA: Diagnosis not present

## 2024-03-29 DIAGNOSIS — N2581 Secondary hyperparathyroidism of renal origin: Secondary | ICD-10-CM | POA: Diagnosis not present

## 2024-03-29 DIAGNOSIS — N186 End stage renal disease: Secondary | ICD-10-CM | POA: Diagnosis not present

## 2024-03-31 DIAGNOSIS — N186 End stage renal disease: Secondary | ICD-10-CM | POA: Diagnosis not present

## 2024-03-31 DIAGNOSIS — Z992 Dependence on renal dialysis: Secondary | ICD-10-CM | POA: Diagnosis not present

## 2024-03-31 DIAGNOSIS — N2581 Secondary hyperparathyroidism of renal origin: Secondary | ICD-10-CM | POA: Diagnosis not present

## 2024-04-03 DIAGNOSIS — N186 End stage renal disease: Secondary | ICD-10-CM | POA: Diagnosis not present

## 2024-04-03 DIAGNOSIS — Z992 Dependence on renal dialysis: Secondary | ICD-10-CM | POA: Diagnosis not present

## 2024-04-03 DIAGNOSIS — N2581 Secondary hyperparathyroidism of renal origin: Secondary | ICD-10-CM | POA: Diagnosis not present

## 2024-04-04 ENCOUNTER — Ambulatory Visit
Admission: EM | Admit: 2024-04-04 | Discharge: 2024-04-04 | Disposition: A | Discharge status: Home / Self Care | Attending: Family Medicine | Admitting: Family Medicine

## 2024-04-04 ENCOUNTER — Ambulatory Visit (INDEPENDENT_AMBULATORY_CARE_PROVIDER_SITE_OTHER)

## 2024-04-04 DIAGNOSIS — J22 Unspecified acute lower respiratory infection: Secondary | ICD-10-CM

## 2024-04-04 DIAGNOSIS — J42 Unspecified chronic bronchitis: Secondary | ICD-10-CM | POA: Diagnosis not present

## 2024-04-04 DIAGNOSIS — R051 Acute cough: Secondary | ICD-10-CM

## 2024-04-04 DIAGNOSIS — R918 Other nonspecific abnormal finding of lung field: Secondary | ICD-10-CM | POA: Diagnosis not present

## 2024-04-04 DIAGNOSIS — R0602 Shortness of breath: Secondary | ICD-10-CM

## 2024-04-04 DIAGNOSIS — R059 Cough, unspecified: Secondary | ICD-10-CM | POA: Diagnosis not present

## 2024-04-04 DIAGNOSIS — R062 Wheezing: Secondary | ICD-10-CM

## 2024-04-04 MED ORDER — ALBUTEROL SULFATE HFA 108 (90 BASE) MCG/ACT IN AERS: 2.0000 | INHALATION_SPRAY | RESPIRATORY_TRACT | 0 refills | Status: DC | PRN

## 2024-04-04 MED ORDER — DEXAMETHASONE SODIUM PHOSPHATE 10 MG/ML IJ SOLN
10.0000 mg | Freq: Once | INTRAMUSCULAR | Status: AC
  Administered 2024-04-04: 10 mg via INTRAMUSCULAR

## 2024-04-04 MED ORDER — IPRATROPIUM-ALBUTEROL 0.5-2.5 (3) MG/3ML IN SOLN
3.0000 mL | Freq: Once | RESPIRATORY_TRACT | Status: AC
  Administered 2024-04-04: 3 mL via RESPIRATORY_TRACT

## 2024-04-04 MED ORDER — AZITHROMYCIN 250 MG PO TABS: ORAL_TABLET | ORAL | 0 refills | Status: DC

## 2024-04-04 NOTE — ED Provider Notes (Signed)
 [facility]    CSN: [id number] Arrival date & time: 04/04/24  3086      History   Chief Complaint No chief complaint on file.   HPI Jacob Farmer is a 78 y.o. male.   Patient presenting today with 3-day history of hacking productive cough, wheezing, chest tightness, congestion, nausea.  Denies chest pain, abdominal pain, vomiting, diarrhea.  So far trying his family members Promethazine  DM with minimal relief.  No known history of chronic pulmonary disease but is a former smoker.  History of end-stage renal disease on dialysis, diabetes, hypertension, hyperlipidemia, rheumatoid arthritis, history of CVA.    Past Medical History:  Diagnosis Date   Acute CVA (cerebrovascular accident) (HCC) 09/11/2023   CAD (coronary artery disease)    Acute myocardial infarction treated with TPA in 1990; 1999-stents to circumflex and RCA; residual total occlusion of the left anterior descending; normal ejection fraction; stress nuclear in 2001-distal anteroseptal ischemia; normal LV function   CKD (chronic kidney disease) stage 4, GFR 15-29 ml/min (HCC)    First degree heart block    History of gastric ulcer    History of kidney stones    History of MI (myocardial infarction)    1990-  treated w/ TPA   Hyperlipidemia    Hypertension    Jaundice    OA (osteoarthritis)    Prediabetes    Prostate cancer (HCC)    Stage T1c , Gleason 3+4,  PSA 4.7,  vol 24cc--  scheduled for radiative seed implants   Psoriatic arthritis (HCC)    RA (rheumatoid arthritis) (HCC)    Dr. 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   Stroke determined by clinical assessment (HCC) 09/12/2023   Ischemic ulcer (HCC) 08/11/2023   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   Prostate cancer (HCC) 08/07/2023   Jejunal ulcer 06/02/2023   Abnormal ultrasound of abdomen 06/02/2023   Acute blood loss anemia 04/19/2023   Anemia due to stage 4 chronic kidney disease (HCC) 03/25/2023   Iron  deficiency anemia 03/25/2023   Hospital discharge follow-up 02/26/2023   ABLA (acute blood loss anemia) 02/26/2023   Acquired thrombophilia (HCC) 02/13/2023   Generalized weakness 02/13/2023   Acute bronchitis 02/13/2023   Drug-induced liver injury 02/08/2023   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   Bleeding from right ear 12/25/2022   Chronic heart failure with preserved ejection fraction (HCC) 07/22/2022   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   Dizziness 04/09/2022   Encounter for general adult medical examination with abnormal findings 01/25/2022   Paroxysmal atrial fibrillation (HCC) 01/22/2022   Allergic dermatitis 09/12/2021   Herniated cervical disc 05/26/2017    Class: Acute   Cervical spinal stenosis 05/26/2017   Primary osteoarthritis of both hands 02/04/2017   History of gout 02/04/2017   DDD (degenerative disc disease), lumbar 02/04/2017   High risk medication use 01/28/2017   History of prostate cancer 01/28/2017   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   Rheumatoid arthritis (HCC) 01/04/2011   Coronary artery disease involving native coronary artery of  native heart without angina pectoris 01/12/2010   Mixed hyperlipidemia 01/08/2010   Essential hypertension 01/08/2010    Past Surgical History:  Procedure Laterality Date   AV FISTULA PLACEMENT Left 01/27/2024   Procedure: CREATION OF LEFT ARM ARTERIOVENOUS (AV) FISTULA;  Surgeon: Adine Hoof, MD;  Location: [facility] OR;  Service: Vascular;  Laterality: Left;   BIOPSY   08/27/2019   Procedure: BIOPSY;  Surgeon: Ruby Corporal, MD;  [facility];  Service: Endoscopy;;  gastric   BIOPSY  04/03/2022   Procedure: BIOPSY;  Surgeon: Urban Garden, MD;  [facility]: [facility];  Service: Gastroenterology;;   BIOPSY  04/21/2023   Procedure: BIOPSY;  Surgeon: Vinetta Greening, DO;  [facility]: [facility];  Service: Endoscopy;;   callus removal Left 09/13/2022   left foot   CARDIOVASCULAR STRESS TEST  2001  per Dr Macdonald [place] clinic note   distal anteroseptal ischemia,  normal LVF   COLONOSCOPY  last one 2006 (approx)   COLONOSCOPY WITH PROPOFOL  N/A 04/03/2022   Procedure: COLONOSCOPY WITH PROPOFOL ;  Surgeon: Urban Garden, MD;  [facility]: [facility];  Service: Gastroenterology;  Laterality: N/A;  945   CORONARY ANGIOPLASTY WITH STENT PLACEMENT  1999   DES x2  to CFX and RCA/  residual total occlusion LAD,  normal LVEF   ENTEROSCOPY N/A 04/24/2023   Procedure: ENTEROSCOPY;  Surgeon: Urban Garden, MD;  [facility]: [facility];  Service: Gastroenterology;  Laterality: N/A;   ESOPHAGOGASTRODUODENOSCOPY (EGD) WITH PROPOFOL  N/A 08/27/2019   Procedure: ESOPHAGOGASTRODUODENOSCOPY (EGD) WITH PROPOFOL ;  Surgeon: Ruby Corporal, MD;  [facility];  Service: Endoscopy;  Laterality: N/A;  10:10   ESOPHAGOGASTRODUODENOSCOPY (EGD) WITH PROPOFOL  N/A 04/03/2022   Procedure: ESOPHAGOGASTRODUODENOSCOPY (EGD) WITH PROPOFOL ;  Surgeon: Urban Garden, MD;  [facility];  Service: Gastroenterology;  Laterality: N/A;   ESOPHAGOGASTRODUODENOSCOPY (EGD) WITH PROPOFOL  N/A 02/16/2023   Procedure: ESOPHAGOGASTRODUODENOSCOPY (EGD) WITH PROPOFOL ;  Surgeon: Urban Garden, MD;  [facility];  Service: Gastroenterology;  Laterality: N/A;   ESOPHAGOGASTRODUODENOSCOPY (EGD) WITH PROPOFOL  N/A 04/21/2023   Procedure: ESOPHAGOGASTRODUODENOSCOPY (EGD) WITH PROPOFOL ;  Surgeon: Vinetta Greening, DO;   [facility];  Service: Endoscopy;  Laterality: N/A;   EYE SURGERY Bilateral    cataract   GIVENS CAPSULE STUDY N/A 04/22/2023   Procedure: GIVENS CAPSULE STUDY;  Surgeon: Urban Garden, MD;  [facility];  Service: Gastroenterology;  Laterality: N/A;   HEMOSTASIS CLIP PLACEMENT  04/24/2023   Procedure: HEMOSTASIS CLIP PLACEMENT;  Surgeon: Urban Garden, MD;  [facility];  Service: Gastroenterology;;   HOT HEMOSTASIS  04/03/2022   Procedure: HOT HEMOSTASIS (ARGON PLASMA COAGULATION/BICAP);  Surgeon: Umberto Ganong, Bearl Limes, MD;  [facility];  Service: Gastroenterology;;   HOT HEMOSTASIS  04/24/2023   Procedure: HOT HEMOSTASIS (ARGON PLASMA COAGULATION/BICAP);  Surgeon: Umberto Ganong, Bearl Limes, MD;  [facility];  Service: Gastroenterology;;   IR FLUORO GUIDE CV LINE RIGHT  05/30/2023   IR US  GUIDE VASC ACCESS RIGHT  05/30/2023   POLYPECTOMY  04/03/2022   Procedure: POLYPECTOMY;  Surgeon: Urban Garden, MD;  [facility];  Service: Gastroenterology;;   POSTERIOR CERVICAL FUSION/FORAMINOTOMY N/A 05/26/2017   Procedure: RIGHT C4-5 FORAMINOTOMY WITH EXCISION OF HERNIATED DISC;  Surgeon: Alphonso Jean, MD;  Location: [facility] OR;  Service: Orthopedics;  Laterality: N/A;   RADIOACTIVE SEED IMPLANT N/A 01/11/2016   Procedure: RADIOACTIVE SEED IMPLANT/BRACHYTHERAPY IMPLANT;  Surgeon:  Trent Frizzle, MD;  Location: [facility];  Service: Urology;  Laterality: N/A;   73  seeds implanted no seeds founds in bladder   SCLEROTHERAPY  04/24/2023   Procedure: SCLEROTHERAPY;  Surgeon: Urban Garden, MD;  [facility];  Service: Gastroenterology;;   SUBMUCOSAL TATTOO INJECTION  04/24/2023   Procedure: SUBMUCOSAL TATTOO INJECTION;  Surgeon: Urban Garden, MD;  [facility];  Service: Gastroenterology;;       Home Medications    Prior to Admission  medications   Medication Sig Start Date End Date Taking? Authorizing Provider  albuterol  (VENTOLIN  HFA) 108 (90 Base) MCG/ACT inhaler Inhale 2 puffs into the lungs every 4 (four) hours as needed. 04/04/24  Yes Corbin Dess, PA-C  azithromycin  (ZITHROMAX ) 250 MG tablet Take first 2 tablets together, then 1 every day until finished. 04/04/24  Yes Corbin Dess, PA-C  allopurinol  (ZYLOPRIM ) 100 MG tablet Take 50 mg by mouth every other day.    [provider]  amLODipine  (NORVASC ) 5 MG tablet TAKE 1 TABLET EVERY DAY 02/20/24   Meldon Sport, MD  apixaban  (ELIQUIS ) 2.5 MG TABS tablet Take 1 tablet (2.5 mg total) by mouth 2 (two) times daily. 01/09/24   Ardeen Kohler, MD  aspirin  EC 81 MG tablet Take 81 mg by mouth in the morning. Swallow whole.    [provider]  calcitRIOL  (ROCALTROL ) 0.25 MCG capsule Take 0.25 mcg by mouth every Monday, Wednesday, and Friday. At dialysis    [provider]  Dupilumab  (DUPIXENT ) 300 MG/2ML SOAJ Inject 300 mg into the skin every 14 (fourteen) days. Starting at day 15 for maintenance. 01/08/24   Harris Liming, MD  folic acid  (FOLVITE ) 400 MCG tablet Take 400 mcg by mouth in the morning.    [provider]  leflunomide  (ARAVA ) 10 MG tablet Take 1 tablet (10 mg total) by mouth daily. 12/18/23   Romayne Clubs, PA-C  Leuprolide  Acetate (ELIGARD  [place]) Inject 1 Dose into the skin every 4 (four) months.    [provider]  pantoprazole  (PROTONIX ) 40 MG tablet TAKE 1 TABLET TWICE DAILY 02/06/24   Castaneda Mayorga, Bearl Limes, MD  RENVELA  800 MG tablet Take 800 mg by mouth 2 (two) times daily with a meal. 08/05/23   [provider]  rosuvastatin  (CRESTOR ) 40 MG tablet Take 1 tablet (40 mg total) by mouth daily. Patient taking differently: Take 40 mg by mouth every evening. 07/03/23   Strader, Dimple Francis, PA-C  triamcinolone  cream (KENALOG ) 0.1 % Apply 1 Application topically daily as needed (irritation). 02/17/24    Justina Oman, MD    Family History Family History  Problem Relation Age of Onset   Heart attack Mother    Coronary artery disease Father    Ulcerative colitis Father    Ulcers Father    Breast cancer Sister    COPD Brother    Pancreatic cancer Brother    Healthy Sister    Diabetes Brother    Cirrhosis Son    Seizures Son     Social History Social History   Tobacco Use   Smoking status: Former    Current packs/day: 0.00    Average packs/day: 1 pack/day for 30.0 years (30.0 ttl pk-yrs)    Types: Cigarettes    Start date: 05/22/1959    Quit date: 05/21/1989    Years since quitting: 34.8    Passive exposure: Never   Smokeless tobacco: Never  Vaping Use   Vaping  status: Never Used  Substance Use Topics   Alcohol  use: Yes    Alcohol /week: 3.0 standard drinks of alcohol     Types: 3 Standard drinks or equivalent per week    Comment: 1 glass of liquor, 4 days a week   Drug use: No     Allergies   Patient has no known allergies.   Review of Systems Review of Systems Per HPI  Physical Exam Triage Vital Signs ED Triage Vitals  Encounter Vitals Group     BP 04/04/24 0829 121/64     Systolic BP Percentile --      Diastolic BP Percentile --      Pulse Rate 04/04/24 0829 80     Resp 04/04/24 0829 18     Temp 04/04/24 0829 98.6 F (37 C)     Temp Source 04/04/24 0829 Oral     SpO2 04/04/24 0829 98 %     Weight --      Height --      Head Circumference --      Peak Flow --      Pain Score 04/04/24 0830 0     Pain Loc --      Pain Education --      Exclude from Growth Chart --    No data found.  Updated Vital Signs BP 121/64 (BP Location: Right Arm)   Pulse 80   Temp 98.6 F (37 C) (Oral)   Resp 18   SpO2 98%   Visual Acuity Right Eye Distance:   Left Eye Distance:   Bilateral Distance:    Right Eye Near:   Left Eye Near:    Bilateral Near:     Physical Exam Vitals and nursing note reviewed.  Constitutional:      Appearance: Normal appearance.   HENT:     Head: Atraumatic.     Nose: Rhinorrhea present.     Mouth/Throat:     Mouth: Mucous membranes are moist.     Pharynx: Posterior oropharyngeal erythema present.  Eyes:     Extraocular Movements: Extraocular movements intact.     Conjunctiva/sclera: Conjunctivae normal.  Cardiovascular:     Rate and Rhythm: Normal rate.  Pulmonary:     Effort: Pulmonary effort is normal.     Breath sounds: Wheezing and rales present.  Musculoskeletal:        General: Normal range of motion.     Cervical back: Normal range of motion and neck supple.  Skin:    General: Skin is warm and dry.  Neurological:     General: No focal deficit present.     Mental Status: He is oriented to person, place, and time.  Psychiatric:        Mood and Affect: Mood normal.        Thought Content: Thought content normal.        Judgment: Judgment normal.      UC Treatments / Results  Labs (all labs ordered are listed, but only abnormal results are displayed) Labs Reviewed - No data to display  EKG   Radiology DG Chest 2 View Result Date: 04/04/2024 CLINICAL DATA:  Cough and wheezing with shortness of breath. EXAM: CHEST - 2 VIEW COMPARISON:  04/12/2024 FINDINGS: There is a right chest wall dialysis catheter with tips at the superior cavoatrial junction. Heart size and mediastinal contours appear normal. Chronic bronchial wall thickening. No superimposed pleural effusion, interstitial edema or airspace disease. No acute osseous findings. IMPRESSION: Chronic bronchial wall  thickening. No acute findings. Electronically Signed   By: Kimberley Penman M.D.   On: 04/04/2024 09:21    Procedures Procedures (including critical care time)  Medications Ordered in UC Medications  ipratropium-albuterol  (DUONEB) 0.5-2.5 (3) MG/3ML nebulizer solution 3 mL (3 mLs Nebulization Given 04/04/24 0908)  dexamethasone  (DECADRON ) injection 10 mg (10 mg Intramuscular Given 04/04/24 0940)    Initial Impression / Assessment  and Plan / UC Course  I have reviewed the triage vital signs and the nursing notes.  Pertinent labs & imaging results that were available during my care of the patient were reviewed by me and considered in my medical decision making (see chart for details).     Vital signs within normal limits today, exam revealing significant wheezes and rales.  Chest x-ray negative for pneumonia, showing chronic bronchitis changes.  Some improvement after DuoNeb treatment in clinic, will treat with IM Decadron , Zithromax , albuterol  inhaler as needed and supportive home care.  Return for worsening symptoms.  Final Clinical Impressions(s) / UC Diagnoses   Final diagnoses:  Acute cough  Lower respiratory infection  Wheezing  SOB (shortness of breath)  Chronic bronchitis, unspecified chronic bronchitis type [facility])   Discharge Instructions   None    ED Prescriptions     Medication Sig Dispense Auth. Provider   albuterol  (VENTOLIN  HFA) 108 (90 Base) MCG/ACT inhaler Inhale 2 puffs into the lungs every 4 (four) hours as needed. 18 g Corbin Dess, PA-C   azithromycin  (ZITHROMAX ) 250 MG tablet Take first 2 tablets together, then 1 every day until finished. 6 tablet Corbin Dess, [place]      PDMP not reviewed this encounter.   Corbin Dess, [place] 04/04/24 1020

## 2024-04-04 NOTE — ED Triage Notes (Signed)
 Pt reports he has been coughing and had nausea x 3 days

## 2024-04-05 DIAGNOSIS — N186 End stage renal disease: Secondary | ICD-10-CM | POA: Diagnosis not present

## 2024-04-05 DIAGNOSIS — Z992 Dependence on renal dialysis: Secondary | ICD-10-CM | POA: Diagnosis not present

## 2024-04-05 DIAGNOSIS — N2581 Secondary hyperparathyroidism of renal origin: Secondary | ICD-10-CM | POA: Diagnosis not present

## 2024-04-06 ENCOUNTER — Ambulatory Visit: Admitting: Physician Assistant

## 2024-04-06 ENCOUNTER — Ambulatory Visit (HOSPITAL_COMMUNITY)
Admission: RE | Admit: 2024-04-06 | Discharge: 2024-04-06 | Disposition: A | Discharge status: Home / Self Care | Source: Ambulatory Visit | Attending: Vascular Surgery | Admitting: Vascular Surgery

## 2024-04-06 VITALS — BP 100/61 | HR 76 | Temp 97.9°F | Resp 18 | Ht 70.0 in | Wt 152.0 lb

## 2024-04-06 DIAGNOSIS — N186 End stage renal disease: Secondary | ICD-10-CM | POA: Diagnosis not present

## 2024-04-06 NOTE — H&P (View-Only) (Signed)
 POST OPERATIVE OFFICE NOTE    CC:  F/u for surgery  HPI:  Jacob Farmer is a 78 y.o. male who is here for repeat postop check.  He recently underwent creation of a left brachiocephalic AV fistula on 01/27/2024 by Dr. Vikki Graves.   At his first follow-up with our office, he was doing well without any issues.  His incision was well-healed and he had no symptoms of steal.  His fistula was slow to mature.  He returns today for follow-up.  He denies any issues.  He has been trying to exercise his left arm.  He dialyzes on MWF via right internal jugular TDC at [facility].   No Known Allergies  Current Outpatient Medications  Medication Sig Dispense Refill   albuterol  (VENTOLIN  HFA) 108 (90 Base) MCG/ACT inhaler Inhale 2 puffs into the lungs every 4 (four) hours as needed. 18 g 0   allopurinol  (ZYLOPRIM ) 100 MG tablet Take 50 mg by mouth every other day.     amLODipine  (NORVASC ) 5 MG tablet TAKE 1 TABLET EVERY DAY 90 tablet 3   apixaban  (ELIQUIS ) 2.5 MG TABS tablet Take 1 tablet (2.5 mg total) by mouth 2 (two) times daily. 60 tablet 11   aspirin  EC 81 MG tablet Take 81 mg by mouth in the morning. Swallow whole.     azithromycin  (ZITHROMAX ) 250 MG tablet Take first 2 tablets together, then 1 every day until finished. 6 tablet 0   calcitRIOL  (ROCALTROL ) 0.25 MCG capsule Take 0.25 mcg by mouth every Monday, Wednesday, and Friday. At dialysis     Dupilumab  (DUPIXENT ) 300 MG/2ML SOAJ Inject 300 mg into the skin every 14 (fourteen) days. Starting at day 15 for maintenance. 12 mL 3   folic acid  (FOLVITE ) 400 MCG tablet Take 400 mcg by mouth in the morning.     leflunomide  (ARAVA ) 10 MG tablet Take 1 tablet (10 mg total) by mouth daily. 90 tablet 0   Leuprolide  Acetate (ELIGARD  [place]) Inject 1 Dose into the skin every 4 (four) months.     pantoprazole  (PROTONIX ) 40 MG tablet TAKE 1 TABLET TWICE DAILY 180 tablet 3   RENVELA  800 MG tablet Take 800 mg by mouth 2 (two) times daily with a meal.      rosuvastatin  (CRESTOR ) 40 MG tablet Take 1 tablet (40 mg total) by mouth daily. (Patient taking differently: Take 40 mg by mouth every evening.) 90 tablet 3   triamcinolone  cream (KENALOG ) 0.1 % Apply 1 Application topically daily as needed (irritation).     No current facility-administered medications for this visit.     ROS:  See HPI  Physical Exam:   Incision: Left arm incision well-healed without signs of infection Extremities: 2+ left radial pulse.  Left brachiocephalic fistula with okay thrill and some pulsatility.  There is a palpable change in diameter of the fistula at the distal upper arm  Neuro: intact motor and sensation of the left hand  Studies: Dialysis Duplex (04/06/2024) --------------------+----------+-----------------+--------+  AVF                PSV (cm/s)Flow Vol (mL/min)Comments  +--------------------+----------+-----------------+--------+  Native artery inflow   105           544                 +--------------------+----------+-----------------+--------+     +------------+---------+-------------+---------+---------------------------  ---+  OUTFLOW VEIN   PSV   Diameter (cm)  Depth             Describe                           (  cm/s)                 (cm)                                    +------------+---------+-------------+---------+---------------------------  ---+  Shoulder      171       0.58       0.26                                    +------------+---------+-------------+---------+---------------------------  ---+  Prox UA        251       0.59       0.17                                    +------------+---------+-------------+---------+---------------------------  ---+  Mid UA         146       0.63       0.25                                    +------------+---------+-------------+---------+---------------------------  ---+  Dist UA        615       0.37       0.26                                     +------------+---------+-------------+---------+---------------------------  ---+  AC Fossa       152       0.79       0.20     competing branch  measuring                                                           0.2cm                +------------+---------+-------------+---------+---------------------------  ---+     Assessment/Plan:  This is a 78 y.o. male who is here for follow up  - The patient continues to do well after creation of left brachiocephalic fistula.  He denies any symptoms of steal in the left hand - Repeat ultrasound of his fistula demonstrates a nearly matured fistula with continued small diameters in the distal upper arm.  Suboptimal flow volume of 544 mL/min - On exam his fistula has a decent thrill in it, however it is somewhat pulsatile.  There is also a palpable change in diameter in the distal upper arm, due to immature size - I have explained to the patient that his fistula is not fully mature yet and does not have flow volumes that can support dialysis.  He would benefit from fistulogram with possible balloon angioplasty to assist in its maturity - He can continue dialysis at this time through his right IJ TDC.  We will plan for left upper arm fistulogram on a Tuesday or Thursday within the next couple of weeks.  He will have to hold his  Eliquis  prior to the procedure  Deneise Finlay, PA-C [facility] [phone]   Clinic MD:  [place] Job

## 2024-04-06 NOTE — Progress Notes (Signed)
 POST OPERATIVE OFFICE NOTE    CC:  F/u for surgery  HPI:  Jacob Farmer is a 78 y.o. male who is here for repeat postop check.  He recently underwent creation of a left brachiocephalic AV fistula on 01/27/2024 by Dr. Vikki Graves.   At his first follow-up with our office, he was doing well without any issues.  His incision was well-healed and he had no symptoms of steal.  His fistula was slow to mature.  He returns today for follow-up.  He denies any issues.  He has been trying to exercise his left arm.  He dialyzes on MWF via right internal jugular TDC at [facility].   No Known Allergies  Current Outpatient Medications  Medication Sig Dispense Refill   albuterol  (VENTOLIN  HFA) 108 (90 Base) MCG/ACT inhaler Inhale 2 puffs into the lungs every 4 (four) hours as needed. 18 g 0   allopurinol  (ZYLOPRIM ) 100 MG tablet Take 50 mg by mouth every other day.     amLODipine  (NORVASC ) 5 MG tablet TAKE 1 TABLET EVERY DAY 90 tablet 3   apixaban  (ELIQUIS ) 2.5 MG TABS tablet Take 1 tablet (2.5 mg total) by mouth 2 (two) times daily. 60 tablet 11   aspirin  EC 81 MG tablet Take 81 mg by mouth in the morning. Swallow whole.     azithromycin  (ZITHROMAX ) 250 MG tablet Take first 2 tablets together, then 1 every day until finished. 6 tablet 0   calcitRIOL  (ROCALTROL ) 0.25 MCG capsule Take 0.25 mcg by mouth every Monday, Wednesday, and Friday. At dialysis     Dupilumab  (DUPIXENT ) 300 MG/2ML SOAJ Inject 300 mg into the skin every 14 (fourteen) days. Starting at day 15 for maintenance. 12 mL 3   folic acid  (FOLVITE ) 400 MCG tablet Take 400 mcg by mouth in the morning.     leflunomide  (ARAVA ) 10 MG tablet Take 1 tablet (10 mg total) by mouth daily. 90 tablet 0   Leuprolide  Acetate (ELIGARD  [place]) Inject 1 Dose into the skin every 4 (four) months.     pantoprazole  (PROTONIX ) 40 MG tablet TAKE 1 TABLET TWICE DAILY 180 tablet 3   RENVELA  800 MG tablet Take 800 mg by mouth 2 (two) times daily with a meal.      rosuvastatin  (CRESTOR ) 40 MG tablet Take 1 tablet (40 mg total) by mouth daily. (Patient taking differently: Take 40 mg by mouth every evening.) 90 tablet 3   triamcinolone  cream (KENALOG ) 0.1 % Apply 1 Application topically daily as needed (irritation).     No current facility-administered medications for this visit.     ROS:  See HPI  Physical Exam:   Incision: Left arm incision well-healed without signs of infection Extremities: 2+ left radial pulse.  Left brachiocephalic fistula with okay thrill and some pulsatility.  There is a palpable change in diameter of the fistula at the distal upper arm  Neuro: intact motor and sensation of the left hand  Studies: Dialysis Duplex (04/06/2024) --------------------+----------+-----------------+--------+  AVF                PSV (cm/s)Flow Vol (mL/min)Comments  +--------------------+----------+-----------------+--------+  Native artery inflow   105           544                 +--------------------+----------+-----------------+--------+     +------------+---------+-------------+---------+---------------------------  ---+  OUTFLOW VEIN   PSV   Diameter (cm)  Depth             Describe                           (  cm/s)                 (cm)                                    +------------+---------+-------------+---------+---------------------------  ---+  Shoulder      171       0.58       0.26                                    +------------+---------+-------------+---------+---------------------------  ---+  Prox UA        251       0.59       0.17                                    +------------+---------+-------------+---------+---------------------------  ---+  Mid UA         146       0.63       0.25                                    +------------+---------+-------------+---------+---------------------------  ---+  Dist UA        615       0.37       0.26                                     +------------+---------+-------------+---------+---------------------------  ---+  AC Fossa       152       0.79       0.20     competing branch  measuring                                                           0.2cm                +------------+---------+-------------+---------+---------------------------  ---+     Assessment/Plan:  This is a 78 y.o. male who is here for follow up  - The patient continues to do well after creation of left brachiocephalic fistula.  He denies any symptoms of steal in the left hand - Repeat ultrasound of his fistula demonstrates a nearly matured fistula with continued small diameters in the distal upper arm.  Suboptimal flow volume of 544 mL/min - On exam his fistula has a decent thrill in it, however it is somewhat pulsatile.  There is also a palpable change in diameter in the distal upper arm, due to immature size - I have explained to the patient that his fistula is not fully mature yet and does not have flow volumes that can support dialysis.  He would benefit from fistulogram with possible balloon angioplasty to assist in its maturity - He can continue dialysis at this time through his right IJ TDC.  We will plan for left upper arm fistulogram on a Tuesday or Thursday within the next couple of weeks.  He will have to hold his  Eliquis  prior to the procedure  Deneise Finlay, PA-C [facility] [phone]   Clinic MD:  Fulton Job

## 2024-04-07 ENCOUNTER — Other Ambulatory Visit: Payer: Self-pay

## 2024-04-07 DIAGNOSIS — N186 End stage renal disease: Secondary | ICD-10-CM

## 2024-04-07 DIAGNOSIS — Z992 Dependence on renal dialysis: Secondary | ICD-10-CM | POA: Diagnosis not present

## 2024-04-07 DIAGNOSIS — N2581 Secondary hyperparathyroidism of renal origin: Secondary | ICD-10-CM | POA: Diagnosis not present

## 2024-04-07 NOTE — Progress Notes (Signed)
 Electrophysiology Office Note:   Date:  04/09/2024  ID:  Jacob Farmer, DOB 02-01-1946, MRN [id number]  Primary Cardiologist: Armida Lander, MD Electrophysiologist: Ardeen Kohler, MD      History of Present Illness:   Jacob Farmer is a 78 y.o. male with h/o coronary disease, paroxysmal atrial fibrillation, tachybradycardia syndrome, HFpEF, type 2 diabetes, essential hypertension with bouts of orthostatic hypotension, rheumatoid and osteoarthritis, end-stage renal disease, prostate cancer, GI bleeding and stroke who was initially seen for evaluation for Watchman device implant. Patient was hospitalized in May 2020 for with acute blood loss anemia secondary to GI bleeding which required transfusion of 2 units of blood.  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  Hemoglobin remained stable and he was discharged off Eliquis .  Patient had recurrence of symptomatic anemia and was found to have a hemoglobin of 4.9 in July 2024 and again required transfusion of blood, this time 4 units PRBCs.  He remained off Eliquis  after this visit.  He then presented in September 2024 to the ED with an acute stroke.  This was felt to be due to cardioembolic origin from his atrial fibrillation off oral anticoagulation.  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    Discussed the use of AI scribe software for clinical note transcription with the patient, who gave verbal consent to proceed.  History of Present Illness Patient continues to report doing relatively well since last clinic visit.  He had surgery for placement of AV fistula in his left upper extremity.  It has not fully matured, so he has not yet started using it and still has a tunneled dialysis catheter placed.  He is supposed to undergo ballooning of his fistula soon and is hoping that he will start  using it in the next month.  He is currently tolerating Eliquis . He feels weak and wobbly in his legs after dialysis sessions, affecting his alertness and energy levels. No falls have occurred, and he uses walls for support at home to maintain balance. He occasionally uses a wheelchair for longer distances when feeling unsteady. He is in his second week of bronchitis and has completed a course of azithromycin . He feels much better.  Review of systems complete and found to be negative unless listed in HPI.   EP Information / Studies Reviewed:    EKG is not ordered today. EKG from 02/13/24 reviewed which showed sinus rhythm with RBBB and first degree AV block.      Echo 09/12/23: 1. Left ventricular ejection fraction, by estimation, is 60 to 65%. The  left ventricle has normal function. The left ventricle demonstrates  regional wall motion abnormalities (see scoring diagram/findings for  description). Left ventricular diastolic  parameters are consistent with Grade I diastolic dysfunction (impaired  relaxation).   2. Right ventricular systolic function is normal. The right ventricular  size is normal. There is normal pulmonary artery systolic pressure.   3. The mitral valve is normal in structure. No evidence of mitral valve  regurgitation. No evidence of mitral stenosis.   4. The aortic valve is tricuspid. Aortic valve regurgitation is not  visualized. No aortic stenosis is present.   5. Aortic dilatation noted. There is mild dilatation of the aortic root,  measuring 41 mm.   6. The inferior vena cava is normal in size with greater than 50%  respiratory  variability, suggesting right atrial pressure of 3 mmHg.    Zio 12/19/23:  HR 33 - 146, average 72 bpm. 2 nonsustained VT (longest 6 beats) Atrial flutter and/or coarse atrial fibrillation detected. Longest episode was 11 minutes and 23 seconds. Occasional supraventricular ectopy, 2.5%. Rare ventricular ectopy. Nocturnal Mobitz 1 AV  block was seen. No symptom trigger episodes.  Risk Assessment/Calculations:   CHA2DS2-VASc Score = 8  The patient's score is based upon: CHF History: 1 HTN History: 1 Diabetes History: 1 Stroke History: 2 Vascular Disease History: 1 Age Score: 2 Gender Score: 0                  Physical Exam:   VS:  BP (!) 100/50   Pulse 71   Ht 5\' 10"  (1.778 m)   Wt 152 lb (68.9 kg)   SpO2 99%   BMI 21.81 kg/m    Wt Readings from Last 3 Encounters:  04/08/24 152 lb (68.9 kg)  04/06/24 152 lb (68.9 kg)  03/11/24 151 lb 3.2 oz (68.6 kg)     GEN: Well nourished, well developed in no acute distress NECK: No JVD CARDIAC: Normal rate and regular. RESPIRATORY:  Clear to auscultation without rales, wheezing or rhonchi  ABDOMEN: Soft, non-distended EXTREMITIES:  No edema; No deformity   ASSESSMENT AND PLAN:   I have seen Jacob Farmer in the office today who is being considered for a Watchman left atrial appendage closure device. I believe they will benefit from this procedure given their history of atrial fibrillation, CHA2DS2-VASc score of 8 and unadjusted risk of stroke/TIA/systemic embolism rate of 15.2% per year. Unfortunately, the patient is not felt to be a long term anticoagulation candidate secondary to GI bleeding and intracranial hemorrhage. The patient's chart has been reviewed and I feel that they would be a candidate for short term oral anticoagulation after Watchman implant.   It is my belief that after undergoing a LAA closure procedure, Jacob Farmer will not need long term anticoagulation which eliminates anticoagulation side effects and major bleeding risk.   Procedural risks for the Watchman implant have been reviewed with the patient including a 0.5% risk of stroke, <1% risk of perforation and <1% risk of device embolization. Other risks include bleeding, vascular damage, tamponade, worsening renal function, and death. The patient understands these risk and wishes to  proceed.     The published clinical data on the safety and effectiveness of WATCHMAN include but are not limited to the following: - Holmes DR, Evalene Hilda, Sick P et al. for the PROTECT AF Investigators. Percutaneous closure of the left atrial appendage versus warfarin therapy for prevention of stroke in patients with atrial fibrillation: a randomised non-inferiority trial. Lancet 2009; 374: 534-42. Evalene Hilda, Doshi SK, Deloria Fetch D et al. on behalf of the PROTECT AF Investigators. Percutaneous Left Atrial Appendage Closure for Stroke Prophylaxis in Patients With Atrial Fibrillation 2.3-Year Follow-up of the PROTECT AF (Watchman Left Atrial Appendage System for Embolic Protection in Patients With Atrial Fibrillation) Trial. Circulation 2013; 127:720-729. - Alli O, Doshi S,  Kar S, Reddy VY, Sievert H et al. Quality of Life Assessment in the Randomized PROTECT AF (Percutaneous Closure of the Left Atrial Appendage Versus Warfarin Therapy for Prevention of Stroke in Patients With Atrial Fibrillation) Trial of Patients at Risk for Stroke With Nonvalvular Atrial Fibrillation. J Am Coll Cardiol 2013; 61:1790-8. Bartholome Ligas DR, Mario Sicard, Price Melven Stable, Whisenant B, Sievert H, Doshi Rolm Clos  Prospective randomized evaluation of the Watchman left atrial appendage Device in patients with atrial fibrillation versus long-term warfarin therapy; the PREVAIL trial. Journal of the [employer] of Cardiology, Vol. 4, No. 1, 2014, 1-11. - Kar S, Doshi SK, Sadhu A, Horton R, Osorio J et al. Primary outcome evaluation of a next-generation left atrial appendage closure device: results from the PINNACLE FLX trial. Circulation 2021;143(18)1754-1762.   After today's visit with the patient which was dedicated solely for shared decision making visit regarding LAA closure device, the patient decided to proceed with the LAA appendage closure procedure scheduled to be done in the near future at [facility]. Prior to  the procedure, I would like to obtain a gated CT scan of the chest with contrast timed for PV/LA visualization.   HAS-BLED score 5 Hypertension Yes  Abnormal renal and liver function (Dialysis, transplant, Cr >2.26 mg/dL /Cirrhosis or Bilirubin >2x Normal or AST/ALT/AP >3x Normal) Yes  Stroke Yes  Bleeding Yes  Labile INR (Unstable/high INR) No  Elderly (>65) Yes  Drugs or alcohol  (>= 8 drinks/week, anti-plt or NSAID) No   CHA2DS2-VASc Score = 8  The patient's score is based upon: CHF History: 1 HTN History: 1 Diabetes History: 1 Stroke History: 2 Vascular Disease History: 1 Age Score: 2 Gender Score: 0       ASSESSMENT AND PLAN: #. Paroxysmal atrial fibrillation #. Secondary hypercoagulable state due to atrial fibrillation #. GI bleeding #. History of CVA #. ESRD on hemodialysis  Patient would likely benefit from Watchman procedure given that he had a stroke while off anticoagulation in the setting of paroxysmal atrial fibrillation.  Unfortunately, last stroke was complicated by hemorrhagic conversion.  He was initially taken off anticoagulation; however, in anticipation of a possible Watchman procedure he has been tolerating Eliquis  2.5 mg twice daily.  On a recent Zio monitor he did have short paroxysms of atrial fibrillation.  For this reason, I think we should proceed with Watchman implantation.  We had been waiting for implant based on his surgeries for AV fistula placement, trying to minimize interruptions in his anticoagulation regimen after implant.  Would also like to have his dialysis catheter removed prior to implant, in efforts to minimize infection risk related to procedure.  Patient believes that he is near maturation of his AV fistula and within the next month he is hoping to start using the fistula and have his tunneled dialysis catheter removed.  We will tentatively plan for watchman implant sometime in June.  We will have him return for a clinic visit with 1 of our  APPs a week or 2 prior to scheduled procedure.   Signed, Ardeen Kohler, MD

## 2024-04-08 ENCOUNTER — Ambulatory Visit: Payer: Medicare HMO | Attending: Cardiology | Admitting: Cardiology

## 2024-04-08 ENCOUNTER — Encounter: Payer: Self-pay | Admitting: Cardiology

## 2024-04-08 VITALS — BP 100/50 | HR 71 | Ht 70.0 in | Wt 152.0 lb

## 2024-04-08 DIAGNOSIS — Z8673 Personal history of transient ischemic attack (TIA), and cerebral infarction without residual deficits: Secondary | ICD-10-CM | POA: Diagnosis not present

## 2024-04-08 DIAGNOSIS — N186 End stage renal disease: Secondary | ICD-10-CM | POA: Diagnosis not present

## 2024-04-08 DIAGNOSIS — I480 Paroxysmal atrial fibrillation: Secondary | ICD-10-CM

## 2024-04-08 DIAGNOSIS — Z8719 Personal history of other diseases of the digestive system: Secondary | ICD-10-CM

## 2024-04-08 DIAGNOSIS — D6869 Other thrombophilia: Secondary | ICD-10-CM

## 2024-04-08 DIAGNOSIS — Z992 Dependence on renal dialysis: Secondary | ICD-10-CM

## 2024-04-08 NOTE — Patient Instructions (Signed)
 Medication Instructions:  Your physician recommends that you continue on your current medications as directed. Please refer to the Current Medication list given to you today.  *If you need a refill on your cardiac medications before your next appointment, please call your pharmacy*  Testing/Procedures: Watchman  Your physician has requested that you have Left atrial appendage (LAA) closure device implantation is a procedure to put a small device in the LAA of the heart. The LAA is a small sac in the wall of the heart's left upper chamber. Blood clots can form in this area. The device, Watchman closes the LAA to help prevent a blood clot and stroke.   Follow-Up: At [facility], you and your health needs are our priority.  As part of our continuing mission to provide you with exceptional heart care, our providers are all part of one team.  This team includes your primary Cardiologist (physician) and Advanced Practice Providers or APPs (Physician Assistants and Nurse Practitioners) who all work together to provide you with the care you need, when you need it.  Your next appointment:   You will be contacted by Nurse Navigator, Karsten Fells to schedule your pre-procedure visit and procedure date. If you have any questions she can be reached at [phone].        1st Floor: - Lobby - Registration  - Pharmacy  - Lab - Cafe  2nd Floor: - PV Lab - Diagnostic Testing (echo, CT, nuclear med)  3rd Floor: - Vacant  4th Floor: - TCTS (cardiothoracic surgery) - AFib Clinic - Structural Heart Clinic - Vascular Surgery  - Vascular Ultrasound  5th Floor: - [facility] (general and EP) - Clinical Pharmacy for coumadin, hypertension, lipid, weight-loss medications, and med management appointments    Valet parking services will be available as well.

## 2024-04-09 DIAGNOSIS — N2581 Secondary hyperparathyroidism of renal origin: Secondary | ICD-10-CM | POA: Diagnosis not present

## 2024-04-09 DIAGNOSIS — N186 End stage renal disease: Secondary | ICD-10-CM | POA: Diagnosis not present

## 2024-04-09 DIAGNOSIS — Z992 Dependence on renal dialysis: Secondary | ICD-10-CM | POA: Diagnosis not present

## 2024-04-12 DIAGNOSIS — Z992 Dependence on renal dialysis: Secondary | ICD-10-CM | POA: Diagnosis not present

## 2024-04-12 DIAGNOSIS — N186 End stage renal disease: Secondary | ICD-10-CM | POA: Diagnosis not present

## 2024-04-12 DIAGNOSIS — N2581 Secondary hyperparathyroidism of renal origin: Secondary | ICD-10-CM | POA: Diagnosis not present

## 2024-04-13 ENCOUNTER — Ambulatory Visit: Payer: Medicare HMO | Admitting: Urology

## 2024-04-14 ENCOUNTER — Other Ambulatory Visit: Payer: Self-pay

## 2024-04-14 ENCOUNTER — Telehealth: Payer: Self-pay

## 2024-04-14 DIAGNOSIS — N186 End stage renal disease: Secondary | ICD-10-CM | POA: Diagnosis not present

## 2024-04-14 DIAGNOSIS — I480 Paroxysmal atrial fibrillation: Secondary | ICD-10-CM

## 2024-04-14 DIAGNOSIS — Z8673 Personal history of transient ischemic attack (TIA), and cerebral infarction without residual deficits: Secondary | ICD-10-CM

## 2024-04-14 DIAGNOSIS — N2581 Secondary hyperparathyroidism of renal origin: Secondary | ICD-10-CM | POA: Diagnosis not present

## 2024-04-14 DIAGNOSIS — Z992 Dependence on renal dialysis: Secondary | ICD-10-CM | POA: Diagnosis not present

## 2024-04-14 DIAGNOSIS — Z8719 Personal history of other diseases of the digestive system: Secondary | ICD-10-CM

## 2024-04-14 DIAGNOSIS — K922 Gastrointestinal hemorrhage, unspecified: Secondary | ICD-10-CM

## 2024-04-14 NOTE — Telephone Encounter (Signed)
 The patient is scheduled for AV fistulagram on 5/6. He would like to proceed with LAAO June 19 if everything goes as planned. Scheduled the patient for visit with Michaelle Adolphus on 6/5 for check-up. If everything goes well, the patient will get EKG, labs, and instructions for procedure on 6/19. The patient's wife was grateful for call and agreed with plan.

## 2024-04-15 DIAGNOSIS — N186 End stage renal disease: Secondary | ICD-10-CM | POA: Diagnosis not present

## 2024-04-15 DIAGNOSIS — Z992 Dependence on renal dialysis: Secondary | ICD-10-CM | POA: Diagnosis not present

## 2024-04-15 NOTE — Telephone Encounter (Signed)
 Confirmed with Dr. Daneil Dunker there is no need for repeat CT prior to LAAO (as indicated in consult note).

## 2024-04-16 DIAGNOSIS — N2581 Secondary hyperparathyroidism of renal origin: Secondary | ICD-10-CM | POA: Diagnosis not present

## 2024-04-16 DIAGNOSIS — N186 End stage renal disease: Secondary | ICD-10-CM | POA: Diagnosis not present

## 2024-04-16 DIAGNOSIS — Z992 Dependence on renal dialysis: Secondary | ICD-10-CM | POA: Diagnosis not present

## 2024-04-19 DIAGNOSIS — N186 End stage renal disease: Secondary | ICD-10-CM | POA: Diagnosis not present

## 2024-04-19 DIAGNOSIS — N2581 Secondary hyperparathyroidism of renal origin: Secondary | ICD-10-CM | POA: Diagnosis not present

## 2024-04-19 DIAGNOSIS — Z992 Dependence on renal dialysis: Secondary | ICD-10-CM | POA: Diagnosis not present

## 2024-04-20 ENCOUNTER — Other Ambulatory Visit (HOSPITAL_COMMUNITY): Payer: Self-pay

## 2024-04-20 ENCOUNTER — Other Ambulatory Visit: Payer: Self-pay

## 2024-04-20 ENCOUNTER — Ambulatory Visit (HOSPITAL_COMMUNITY)
Admission: RE | Admit: 2024-04-20 | Discharge: 2024-04-20 | Disposition: A | Discharge status: Home / Self Care | Attending: Surgery | Admitting: Surgery

## 2024-04-20 ENCOUNTER — Encounter (HOSPITAL_COMMUNITY)
Admission: RE | Disposition: A | Discharge status: Home / Self Care | Payer: Self-pay | Source: Home / Self Care | Attending: Surgery

## 2024-04-20 ENCOUNTER — Encounter (HOSPITAL_COMMUNITY): Payer: Self-pay | Admitting: Surgery

## 2024-04-20 ENCOUNTER — Encounter: Admitting: Internal Medicine

## 2024-04-20 ENCOUNTER — Ambulatory Visit (HOSPITAL_BASED_OUTPATIENT_CLINIC_OR_DEPARTMENT_OTHER): Admitting: Certified Registered Nurse Anesthetist

## 2024-04-20 ENCOUNTER — Ambulatory Visit (HOSPITAL_COMMUNITY): Admitting: Certified Registered Nurse Anesthetist

## 2024-04-20 DIAGNOSIS — E1122 Type 2 diabetes mellitus with diabetic chronic kidney disease: Secondary | ICD-10-CM

## 2024-04-20 DIAGNOSIS — I120 Hypertensive chronic kidney disease with stage 5 chronic kidney disease or end stage renal disease: Secondary | ICD-10-CM | POA: Insufficient documentation

## 2024-04-20 DIAGNOSIS — I2510 Atherosclerotic heart disease of native coronary artery without angina pectoris: Secondary | ICD-10-CM | POA: Insufficient documentation

## 2024-04-20 DIAGNOSIS — X58XXXA Exposure to other specified factors, initial encounter: Secondary | ICD-10-CM | POA: Insufficient documentation

## 2024-04-20 DIAGNOSIS — Z7901 Long term (current) use of anticoagulants: Secondary | ICD-10-CM | POA: Insufficient documentation

## 2024-04-20 DIAGNOSIS — M1990 Unspecified osteoarthritis, unspecified site: Secondary | ICD-10-CM | POA: Diagnosis not present

## 2024-04-20 DIAGNOSIS — K279 Peptic ulcer, site unspecified, unspecified as acute or chronic, without hemorrhage or perforation: Secondary | ICD-10-CM | POA: Diagnosis not present

## 2024-04-20 DIAGNOSIS — N185 Chronic kidney disease, stage 5: Secondary | ICD-10-CM

## 2024-04-20 DIAGNOSIS — Z87891 Personal history of nicotine dependence: Secondary | ICD-10-CM | POA: Diagnosis not present

## 2024-04-20 DIAGNOSIS — Z7969 Long term (current) use of other immunomodulators and immunosuppressants: Secondary | ICD-10-CM | POA: Insufficient documentation

## 2024-04-20 DIAGNOSIS — I4891 Unspecified atrial fibrillation: Secondary | ICD-10-CM | POA: Insufficient documentation

## 2024-04-20 DIAGNOSIS — D631 Anemia in chronic kidney disease: Secondary | ICD-10-CM | POA: Insufficient documentation

## 2024-04-20 DIAGNOSIS — Z79899 Other long term (current) drug therapy: Secondary | ICD-10-CM | POA: Insufficient documentation

## 2024-04-20 DIAGNOSIS — Z992 Dependence on renal dialysis: Secondary | ICD-10-CM | POA: Diagnosis not present

## 2024-04-20 DIAGNOSIS — T82898A Other specified complication of vascular prosthetic devices, implants and grafts, initial encounter: Secondary | ICD-10-CM | POA: Diagnosis not present

## 2024-04-20 DIAGNOSIS — K219 Gastro-esophageal reflux disease without esophagitis: Secondary | ICD-10-CM | POA: Insufficient documentation

## 2024-04-20 DIAGNOSIS — N186 End stage renal disease: Secondary | ICD-10-CM | POA: Diagnosis not present

## 2024-04-20 HISTORY — PX: A/V FISTULAGRAM: CATH118298

## 2024-04-20 HISTORY — PX: LIGATION OF ARTERIOVENOUS  FISTULA: SHX5948

## 2024-04-20 LAB — POCT I-STAT, CHEM 8
BUN: 23 mg/dL (ref 8–23)
Calcium, Ion: 1.07 mmol/L — ABNORMAL LOW (ref 1.15–1.40)
Chloride: 95 mmol/L — ABNORMAL LOW (ref 98–111)
Creatinine, Ser: 4.3 mg/dL — ABNORMAL HIGH (ref 0.61–1.24)
Glucose, Bld: 112 mg/dL — ABNORMAL HIGH (ref 70–99)
HCT: 30 % — ABNORMAL LOW (ref 39.0–52.0)
Hemoglobin: 10.2 g/dL — ABNORMAL LOW (ref 13.0–17.0)
Potassium: 3.1 mmol/L — ABNORMAL LOW (ref 3.5–5.1)
Sodium: 138 mmol/L (ref 135–145)
TCO2: 30 mmol/L (ref 22–32)

## 2024-04-20 SURGERY — LIGATION OF ARTERIOVENOUS  FISTULA
Anesthesia: General | Laterality: Left

## 2024-04-20 SURGERY — A/V FISTULAGRAM
Anesthesia: LOCAL | Laterality: Left

## 2024-04-20 MED ORDER — PHENYLEPHRINE 80 MCG/ML (10ML) SYRINGE FOR IV PUSH (FOR BLOOD PRESSURE SUPPORT)
PREFILLED_SYRINGE | INTRAVENOUS | Status: DC | PRN
  Administered 2024-04-20: 80 ug via INTRAVENOUS

## 2024-04-20 MED ORDER — FENTANYL CITRATE (PF) 250 MCG/5ML IJ SOLN
INTRAMUSCULAR | Status: DC | PRN
  Administered 2024-04-20 (×2): 25 ug via INTRAVENOUS

## 2024-04-20 MED ORDER — FENTANYL CITRATE (PF) 250 MCG/5ML IJ SOLN
INTRAMUSCULAR | Status: AC
Start: 2024-04-20 — End: ?
  Filled 2024-04-20: qty 5

## 2024-04-20 MED ORDER — 0.9 % SODIUM CHLORIDE (POUR BTL) OPTIME
TOPICAL | Status: DC | PRN
  Administered 2024-04-20: 1000 mL

## 2024-04-20 MED ORDER — FENTANYL CITRATE (PF) 100 MCG/2ML IJ SOLN: 25.0000 ug | INTRAMUSCULAR | Status: DC | PRN

## 2024-04-20 MED ORDER — OXYCODONE HCL 5 MG PO TABS: 5.0000 mg | ORAL_TABLET | Freq: Once | ORAL | Status: DC | PRN

## 2024-04-20 MED ORDER — HEPARIN (PORCINE) IN NACL 1000-0.9 UT/500ML-% IV SOLN
INTRAVENOUS | Status: DC | PRN
  Administered 2024-04-20: 1000 mL

## 2024-04-20 MED ORDER — SODIUM CHLORIDE 0.9 % IV SOLN: INTRAVENOUS | Status: DC | PRN

## 2024-04-20 MED ORDER — LIDOCAINE HCL (PF) 1 % IJ SOLN
INTRAMUSCULAR | Status: AC
  Filled 2024-04-20: qty 30

## 2024-04-20 MED ORDER — FENTANYL CITRATE (PF) 100 MCG/2ML IJ SOLN
INTRAMUSCULAR | Status: DC | PRN
  Administered 2024-04-20: 25 ug via INTRAVENOUS

## 2024-04-20 MED ORDER — ONDANSETRON HCL 4 MG/2ML IJ SOLN
INTRAMUSCULAR | Status: DC | PRN
Start: 2024-04-20 — End: 2024-04-20
  Administered 2024-04-20: 4 mg via INTRAVENOUS

## 2024-04-20 MED ORDER — PROPOFOL 10 MG/ML IV BOLUS
INTRAVENOUS | Status: DC | PRN
  Administered 2024-04-20: 90 mg via INTRAVENOUS

## 2024-04-20 MED ORDER — CEFAZOLIN SODIUM-DEXTROSE 2-3 GM-%(50ML) IV SOLR
INTRAVENOUS | Status: DC | PRN
  Administered 2024-04-20: 2 g via INTRAVENOUS

## 2024-04-20 MED ORDER — FENTANYL CITRATE (PF) 100 MCG/2ML IJ SOLN
INTRAMUSCULAR | Status: AC
  Filled 2024-04-20: qty 2

## 2024-04-20 MED ORDER — MIDAZOLAM HCL 2 MG/2ML IJ SOLN
INTRAMUSCULAR | Status: AC
Start: 2024-04-20 — End: ?
  Filled 2024-04-20: qty 2

## 2024-04-20 MED ORDER — LIDOCAINE HCL (PF) 1 % IJ SOLN
INTRAMUSCULAR | Status: DC | PRN
  Administered 2024-04-20: 5 mL via INTRADERMAL

## 2024-04-20 MED ORDER — OXYCODONE HCL 5 MG/5ML PO SOLN: 5.0000 mg | Freq: Once | ORAL | Status: DC | PRN

## 2024-04-20 MED ORDER — SODIUM CHLORIDE 0.9% FLUSH: 3.0000 mL | INTRAVENOUS | Status: DC | PRN

## 2024-04-20 MED ORDER — HYDROCODONE-ACETAMINOPHEN 5-325 MG PO TABS
1.0000 | ORAL_TABLET | Freq: Four times a day (QID) | ORAL | 0 refills | Status: DC | PRN
  Filled 2024-04-20: qty 8, 2d supply, fill #0

## 2024-04-20 MED ORDER — MIDAZOLAM HCL 2 MG/2ML IJ SOLN
INTRAMUSCULAR | Status: DC | PRN
  Administered 2024-04-20: 1 mg via INTRAVENOUS

## 2024-04-20 MED ORDER — DEXAMETHASONE SODIUM PHOSPHATE 10 MG/ML IJ SOLN
INTRAMUSCULAR | Status: DC | PRN
  Administered 2024-04-20: 5 mg via INTRAVENOUS

## 2024-04-20 MED ORDER — ACETAMINOPHEN 10 MG/ML IV SOLN: 1000.0000 mg | Freq: Once | INTRAVENOUS | Status: DC | PRN

## 2024-04-20 MED ORDER — LIDOCAINE HCL (CARDIAC) PF 100 MG/5ML IV SOSY
PREFILLED_SYRINGE | INTRAVENOUS | Status: DC | PRN
  Administered 2024-04-20: 60 mg via INTRAVENOUS

## 2024-04-20 SURGICAL SUPPLY — 24 items
BAG COUNTER SPONGE SURGICOUNT (BAG) ×1 IMPLANT
CANISTER SUCT 3000ML PPV (MISCELLANEOUS) ×1 IMPLANT
CLIP TI MEDIUM 6 (CLIP) ×1 IMPLANT
CLIP TI WIDE RED SMALL 6 (CLIP) ×1 IMPLANT
DERMABOND ADVANCED .7 DNX12 (GAUZE/BANDAGES/DRESSINGS) ×1 IMPLANT
ELECTRODE REM PT RTRN 9FT ADLT (ELECTROSURGICAL) ×1 IMPLANT
GLOVE BIOGEL PI IND STRL 7.0 (GLOVE) ×1 IMPLANT
GOWN STRL REUS W/ TWL LRG LVL3 (GOWN DISPOSABLE) ×2 IMPLANT
GOWN STRL REUS W/ TWL XL LVL3 (GOWN DISPOSABLE) ×1 IMPLANT
KIT BASIN OR (CUSTOM PROCEDURE TRAY) ×1 IMPLANT
KIT TURNOVER KIT B (KITS) ×1 IMPLANT
NS IRRIG 1000ML POUR BTL (IV SOLUTION) ×1 IMPLANT
PACK CV ACCESS (CUSTOM PROCEDURE TRAY) ×1 IMPLANT
PAD ARMBOARD POSITIONER FOAM (MISCELLANEOUS) ×2 IMPLANT
POWDER SURGICEL 3.0 GRAM (HEMOSTASIS) IMPLANT
SHEATH PROBE COVER 6X72 (BAG) IMPLANT
SUT ETHILON 3 0 PS 1 (SUTURE) IMPLANT
SUT MNCRL AB 4-0 PS2 18 (SUTURE) ×1 IMPLANT
SUT PROLENE 6 0 BV (SUTURE) IMPLANT
SUT SILK 0 TIES 10X30 (SUTURE) ×1 IMPLANT
SUT VIC AB 3-0 SH 27X BRD (SUTURE) ×1 IMPLANT
TOWEL GREEN STERILE (TOWEL DISPOSABLE) ×1 IMPLANT
UNDERPAD 30X36 HEAVY ABSORB (UNDERPADS AND DIAPERS) ×1 IMPLANT
WATER STERILE IRR 1000ML POUR (IV SOLUTION) ×1 IMPLANT

## 2024-04-20 SURGICAL SUPPLY — 8 items
BAG SNAP BAND KOVER 36X36 (MISCELLANEOUS) ×1 IMPLANT
COVER DOME SNAP 22 D (MISCELLANEOUS) ×1 IMPLANT
KIT MICROPUNCTURE NIT STIFF (SHEATH) IMPLANT
SHEATH PROBE COVER 6X72 (BAG) ×1 IMPLANT
STATION PROTECTION PRESSURIZED (MISCELLANEOUS) ×1 IMPLANT
STOPCOCK MORSE 400PSI 3WAY (MISCELLANEOUS) ×1 IMPLANT
TRAY PV CATH (CUSTOM PROCEDURE TRAY) ×1 IMPLANT
TUBING CIL FLEX 10 FLL-RA (TUBING) ×1 IMPLANT

## 2024-04-20 NOTE — Interval H&P Note (Signed)
 History and Physical Interval Note:  04/20/2024 7:27 AM  Jacob Farmer  has presented today for surgery, with the diagnosis of left arm - instage renal.  The various methods of treatment have been discussed with the patient and family. After consideration of risks, benefits and other options for treatment, the patient has consented to  Procedure(s): A/V Fistulagram (N/A) as a surgical intervention.  The patient's history has been reviewed, patient examined, no change in status, stable for surgery.  I have reviewed the patient's chart and labs.  Questions were answered to the patient's satisfaction.     Gareld June

## 2024-04-20 NOTE — Anesthesia Preprocedure Evaluation (Addendum)
 Anesthesia Evaluation  Patient identified by MRN, date of birth, ID band Patient awake    Reviewed: Allergy  & Precautions, NPO status , Patient's Chart, lab work & pertinent test results  History of Anesthesia Complications Negative for: history of anesthetic complications  Airway Mallampati: III  TM Distance: >3 FB Neck ROM: Full    Dental  (+) Dental Advisory Given, Edentulous Upper, Edentulous Lower, Lower Dentures, Upper Dentures   Pulmonary neg shortness of breath, neg COPD, neg recent URI, former smoker   breath sounds clear to auscultation       Cardiovascular hypertension, Pt. on medications (-) angina + CAD, + Past MI and + Cardiac Stents  + dysrhythmias Atrial Fibrillation  Rhythm:Irregular   1. Left ventricular ejection fraction, by estimation, is 60 to 65%. The  left ventricle has normal function. The left ventricle demonstrates  regional wall motion abnormalities (see scoring diagram/findings for  description). Left ventricular diastolic  parameters are consistent with Grade I diastolic dysfunction (impaired  relaxation).   2. Right ventricular systolic function is normal. The right ventricular  size is normal. There is normal pulmonary artery systolic pressure.   3. The mitral valve is normal in structure. No evidence of mitral valve  regurgitation. No evidence of mitral stenosis.   4. The aortic valve is tricuspid. Aortic valve regurgitation is not  visualized. No aortic stenosis is present.   5. Aortic dilatation noted. There is mild dilatation of the aortic root,  measuring 41 mm.   6. The inferior vena cava is normal in size with greater than 50%  respiratory variability, suggesting right atrial pressure of 3 mmHg.     Neuro/Psych neg Seizures 9/24 cva CVA, No Residual Symptoms    GI/Hepatic Neg liver ROS, PUD,GERD  ,,  Endo/Other  diabetes  Lab Results      Component                Value                Date                      HGBA1C                   5.7                 02/03/2024             Renal/GU ESRF and DialysisRenal diseaseLab Results      Component                Value               Date                      NA                       138                 04/20/2024                K                        3.1 (L)             04/20/2024                CO2  30                  02/17/2024                GLUCOSE                  112 (H)             04/20/2024                BUN                      23                  04/20/2024                CREATININE               4.30 (H)            04/20/2024                CALCIUM                   8.3 (L)             02/17/2024                EGFR                     12 (L)              12/04/2023                GFRNONAA                 13 (L)              02/17/2024                Musculoskeletal  (+) Arthritis ,    Abdominal   Peds  Hematology  (+) Blood dyscrasia, anemia Lab Results      Component                Value               Date                      WBC                      8.0                 02/17/2024                HGB                      10.2 (L)            04/20/2024                HCT                      30.0 (L)            04/20/2024                MCV                      95.3  02/17/2024                PLT                      195                 02/17/2024              Anesthesia Other Findings   Reproductive/Obstetrics                             Anesthesia Physical Anesthesia Plan  ASA: 4  Anesthesia Plan: General   Post-op Pain Management:    Induction: Intravenous  PONV Risk Score and Plan: 2 and Ondansetron  and Dexamethasone   Airway Management Planned: LMA and Oral ETT  Additional Equipment: None  Intra-op Plan:   Post-operative Plan: Extubation in OR  Informed Consent: I have reviewed the patients History and Physical, chart,  labs and discussed the procedure including the risks, benefits and alternatives for the proposed anesthesia with the patient or authorized representative who has indicated his/her understanding and acceptance.     Dental advisory given  Plan Discussed with: CRNA  Anesthesia Plan Comments:         Anesthesia Quick Evaluation

## 2024-04-20 NOTE — Transfer of Care (Signed)
 Immediate Anesthesia Transfer of Care Note  Patient: LADAMION KEUSCH  Procedure(s) Performed: BRANCH LIGATION OF LEFT ARM ARTERIOVENOUS FISTULA (Left)  Patient Location: PACU  Anesthesia Type:General  Level of Consciousness: awake, alert , oriented, patient cooperative, and responds to stimulation  Airway & Oxygen Therapy: Patient Spontanous Breathing and Patient connected to face mask oxygen  Post-op Assessment: Report given to RN and Post -op Vital signs reviewed and stable  Post vital signs: Reviewed and stable  Last Vitals:  Vitals Value Taken Time  BP 169/69 04/20/24 1148  Temp    Pulse 56 04/20/24 1150  Resp 13 04/20/24 1150  SpO2 100 % 04/20/24 1150  Vitals shown include unfiled device data.  Last Pain:  Vitals:   04/20/24 0805  TempSrc:   PainSc: 0-No pain         Complications: No notable events documented.

## 2024-04-20 NOTE — H&P (Signed)
 Patient seen and examined.  No complaints. No changes to medication history or physical exam since last seen.  Reviewed Dr. Mick Alamin fistulagram After discussing the risks and benefits of Left arm AVF branch ligation, Jacob Farmer elected to proceed.   Philipp Brawn MD

## 2024-04-20 NOTE — Op Note (Signed)
    OPERATIVE NOTE  PROCEDURE:   Intraoperative left arm vein mapping Left AVF branch ligation  PRE-OPERATIVE DIAGNOSIS: ESRD  POST-OPERATIVE DIAGNOSIS: same as above   SURGEON: Philipp Brawn MD  ASSISTANT(S): Naida Austria, PA  Given the complexity of the case,  the assistant was necessary in order to expedient the procedure and safely perform the technical aspects of the operation.  The assistant provided traction and countertraction to assist with exposure of the AVF.  They also assisted with suture ligation of the venous branches.  ANESTHESIA: general  ESTIMATED BLOOD LOSS: minimal  FINDING(S): 2 large side branches identified and ligated Palpable thrill on completion  SPECIMEN(S):  none  INDICATIONS:   Jacob Farmer is a 78 y.o. male with ESRD. The patient is currently on dialysis via [facility].  He underwent fistulogram with Dr. 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  He was noted to have 2 competing branches with no evidence of flow-limiting stenosis.  Risk and benefits of branch ligation were reviewed, he expressed understanding and elected to proceed.  DESCRIPTION: The patient was brought to the operating room positioned supine on the operating table.  The left arm was prepped and draped in usual sterile fashion.  Timeout was performed and preoperative antibiotics were administered. I started by mapping out the left arm fistula and marking the 2 areas where there were large competing branches.  2 small transverse incisions were made overlying these areas.  Dissection was carried further distally until visualizing the fistula.  I then circumferentially dissected both branches and these were ligated and transected between silk ties.  The 2 incisions were then copiously irrigated, there was a palpable thrill in the fistula and the incisions were closed in layers with 3-0 Vicryl and 4-0 Monocryl for the skin.  COMPLICATIONS: none apparent  CONDITION:  stable  Philipp Brawn MD [facility] Phone Number: [phone] 04/20/2024 12:50 PM

## 2024-04-20 NOTE — Anesthesia Procedure Notes (Signed)
 Procedure Name: LMA Insertion Date/Time: 04/20/2024 11:05 AM  Performed by: Grier Leber, CRNAPre-anesthesia Checklist: Patient identified, Emergency Drugs available, Suction available and Patient being monitored Patient Re-evaluated:Patient Re-evaluated prior to induction Oxygen Delivery Method: Circle System Utilized Preoxygenation: Pre-oxygenation with 100% oxygen Induction Type: IV induction Ventilation: Mask ventilation without difficulty LMA: LMA inserted LMA Size: 4.0 Number of attempts: 1 Airway Equipment and Method: Bite block Placement Confirmation: positive ETCO2 Tube secured with: Tape Dental Injury: Teeth and Oropharynx as per pre-operative assessment

## 2024-04-20 NOTE — Op Note (Signed)
    Patient name: Jacob Farmer MRN: [id number] DOB: [birth year] Sex: male  04/20/2024 Pre-operative Diagnosis: None maturing left brachiocephalic fistula Post-operative diagnosis:  Same Surgeon:  Gareld June Procedure Performed:  1.  Ultrasound-guided access, left cephalic vein  2.  Fistulogram  3.  Conscious sedation, 17 minutes   Indications: This is a 78 year old gentleman with end-stage renal disease and a left brachiocephalic fistula that has not fully matured.  He comes today for further evaluation.  Procedure:  The patient was identified in the holding area and taken to room 8.  The patient was then placed supine on the table and prepped and draped in the usual sterile fashion.  A time out was called.  Conscious sedation was administered with the use of IV fentanyl  and Versed  under continuous physician and nurse monitoring.  Heart rate, blood pressure, and oxygen saturations were continuously monitored.  Total sedation time was 17 minutes ultrasound was used to evaluate the fistula.  The vein was patent and compressible.  A digital ultrasound image was acquired.  The fistula was then accessed under ultrasound guidance using a micropuncture needle.  An 018 wire was then asvanced without resistance and a micropuncture sheath was placed.  Contrast injections were then performed through the sheath.  Findings: No evidence of central venous stenosis.  The cephalic vein is widely patent throughout the upper arm.  There are 2 competing branches.  The arteriovenous anastomosis is widely patent     Impression:  #1  Widely patent left brachiocephalic fistula  #2  Competing branches that will require surgical ligation  V. Gareld June, M.D., [facility] [place] Office: [phone] Pager:  [phone]

## 2024-04-21 ENCOUNTER — Encounter (HOSPITAL_COMMUNITY): Payer: Self-pay | Admitting: Surgery

## 2024-04-21 ENCOUNTER — Telehealth: Payer: Self-pay

## 2024-04-21 DIAGNOSIS — N186 End stage renal disease: Secondary | ICD-10-CM | POA: Diagnosis not present

## 2024-04-21 DIAGNOSIS — N2581 Secondary hyperparathyroidism of renal origin: Secondary | ICD-10-CM | POA: Diagnosis not present

## 2024-04-21 DIAGNOSIS — Z992 Dependence on renal dialysis: Secondary | ICD-10-CM | POA: Diagnosis not present

## 2024-04-21 NOTE — Telephone Encounter (Signed)
 Jacob Farmer

## 2024-04-23 DIAGNOSIS — N186 End stage renal disease: Secondary | ICD-10-CM | POA: Diagnosis not present

## 2024-04-23 DIAGNOSIS — Z992 Dependence on renal dialysis: Secondary | ICD-10-CM | POA: Diagnosis not present

## 2024-04-23 DIAGNOSIS — N2581 Secondary hyperparathyroidism of renal origin: Secondary | ICD-10-CM | POA: Diagnosis not present

## 2024-04-23 NOTE — Anesthesia Postprocedure Evaluation (Signed)
 Anesthesia Post Note  Patient: Jacob Farmer  Procedure(s) Performed: BRANCH LIGATION OF LEFT ARM ARTERIOVENOUS FISTULA (Left)     Patient location during evaluation: PACU Anesthesia Type: General Level of consciousness: awake and alert Pain management: pain level controlled Vital Signs Assessment: post-procedure vital signs reviewed and stable Respiratory status: spontaneous breathing, nonlabored ventilation and respiratory function stable Cardiovascular status: blood pressure returned to baseline and stable Postop Assessment: no apparent nausea or vomiting Anesthetic complications: no   No notable events documented.  Last Vitals:  Vitals:   04/20/24 1215 04/20/24 1230  BP: (!) 159/69 (!) 162/64  Pulse: 68 65  Resp: 12 13  Temp:  36.6 C  SpO2: 94% 98%    Last Pain:  Vitals:   04/20/24 1215  TempSrc:   PainSc: 0-No pain                 [place]

## 2024-04-26 ENCOUNTER — Other Ambulatory Visit: Payer: Self-pay | Admitting: Physician Assistant

## 2024-04-26 DIAGNOSIS — Z992 Dependence on renal dialysis: Secondary | ICD-10-CM | POA: Diagnosis not present

## 2024-04-26 DIAGNOSIS — M0600 Rheumatoid arthritis without rheumatoid factor, unspecified site: Secondary | ICD-10-CM

## 2024-04-26 DIAGNOSIS — N186 End stage renal disease: Secondary | ICD-10-CM | POA: Diagnosis not present

## 2024-04-26 DIAGNOSIS — N2581 Secondary hyperparathyroidism of renal origin: Secondary | ICD-10-CM | POA: Diagnosis not present

## 2024-04-26 NOTE — Telephone Encounter (Signed)
 Last Fill: 12/18/2023  Labs: CBC- 02/17/2024           RBC 3.00           Hemoglobin 8.9           HCT 28.6 CMP-02/14/2024 Sodium 132 Chloride 91 Glucose 100 BUN 49 Creatinine, Ser 6.53 Calcium  8.7 Total Protein 6.1 Albumin 2.9 GFR Estimated 8 Anion gap 16  Next Visit: 05/18/2024  Last Visit: 12/18/2023  DX: Seronegative rheumatoid arthritis   Current Dose per office note 12/18/2023: Arava  10 mg 1 tablet by mouth daily   allopurinol  100 mg 1 tablet by mouth every other day   Spoke with patient and confirmed he is taking 1/2 every other day of Allopurinol    Okay to refill Arava   and Allopurinol ?

## 2024-04-28 DIAGNOSIS — Z992 Dependence on renal dialysis: Secondary | ICD-10-CM | POA: Diagnosis not present

## 2024-04-28 DIAGNOSIS — N186 End stage renal disease: Secondary | ICD-10-CM | POA: Diagnosis not present

## 2024-04-28 DIAGNOSIS — N2581 Secondary hyperparathyroidism of renal origin: Secondary | ICD-10-CM | POA: Diagnosis not present

## 2024-04-30 DIAGNOSIS — Z992 Dependence on renal dialysis: Secondary | ICD-10-CM | POA: Diagnosis not present

## 2024-04-30 DIAGNOSIS — N2581 Secondary hyperparathyroidism of renal origin: Secondary | ICD-10-CM | POA: Diagnosis not present

## 2024-04-30 DIAGNOSIS — N186 End stage renal disease: Secondary | ICD-10-CM | POA: Diagnosis not present

## 2024-05-03 DIAGNOSIS — N186 End stage renal disease: Secondary | ICD-10-CM | POA: Diagnosis not present

## 2024-05-03 DIAGNOSIS — N2581 Secondary hyperparathyroidism of renal origin: Secondary | ICD-10-CM | POA: Diagnosis not present

## 2024-05-03 DIAGNOSIS — Z992 Dependence on renal dialysis: Secondary | ICD-10-CM | POA: Diagnosis not present

## 2024-05-04 NOTE — Progress Notes (Signed)
 Office Visit Note  Patient: Jacob Farmer             Date of Birth: 02-26-1946           MRN: [id number]             PCP: Meldon Sport, MD Referring: Meldon Sport, MD Visit Date: 05/18/2024 Occupation: @GUAROCC @  Subjective:  Cough   History of Present Illness: Jacob Farmer is a 78 y.o. male with history of seronegative rheumatoid arthritis and osteoarthritis.  Patient remains on  Arava  10 mg 1 tablet by mouth daily.  He is tolerating Arava  without any side effects and has not had any recent gaps in therapy.  He denies any signs or symptoms of a rheumatoid arthritis flare.  He is not experiencing joint pain at this time.  He denies any morning stiffness, nocturnal pain, or difficulty performing ADLs.  He denies any signs or symptoms of a gout flare.  He remains on allopurinol  50 mg every other day.  Patient reports that in April he was evaluated at urgent care for a cough and congestion.  He was diagnosed with bronchitis and was treated with a Z-Pak.  Patient reports that he had temporary relief but after about 2 weeks he had a recurrence of a persistent cough.  He has been experiencing wheezing and shortness of breath and difficulty sleeping at night due to the dry cough.  He is planning to follow-up with his PCP today for further evaluation.  Patient states that he has not been holding Arava .  He continues to go to dialysis Monday, Wednesday, and Friday every week.    Activities of Daily Living:  Patient reports morning stiffness for 0 minutes.   Patient Denies nocturnal pain.  Difficulty dressing/grooming: Denies Difficulty climbing stairs: Denies Difficulty getting out of chair: Denies Difficulty using hands for taps, buttons, cutlery, and/or writing: Reports  Review of Systems  Constitutional:  Positive for fatigue.  HENT:  Negative for mouth sores and mouth dryness.   Eyes:  Negative for dryness.  Respiratory:  Positive for cough, shortness of breath and wheezing.    Cardiovascular:  Negative for chest pain and palpitations.  Gastrointestinal:  Positive for diarrhea. Negative for blood in stool and constipation.  Endocrine: Negative for increased urination.  Genitourinary:  Negative for involuntary urination.  Musculoskeletal:  Positive for myalgias, muscle weakness and myalgias. Negative for joint pain, joint pain, joint swelling, morning stiffness and muscle tenderness.  Skin:  Negative for color change, rash, hair loss and sensitivity to sunlight.  Allergic/Immunologic: Negative for susceptible to infections.  Neurological:  Positive for light-headedness. Negative for dizziness and headaches.  Hematological:  Negative for swollen glands.  Psychiatric/Behavioral:  Positive for sleep disturbance. Negative for depressed mood. The patient is not nervous/anxious.     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   Stroke determined by clinical assessment (HCC) 09/12/2023   Ischemic ulcer (HCC) 08/11/2023   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   Prostate cancer (HCC) 08/07/2023   Jejunal ulcer 06/02/2023   Abnormal ultrasound of abdomen 06/02/2023   Acute blood loss anemia 04/19/2023  Anemia due to stage 4 chronic kidney disease (HCC) 03/25/2023   Iron  deficiency anemia 03/25/2023   Hospital discharge follow-up 02/26/2023   ABLA (acute blood loss anemia) 02/26/2023   Acquired thrombophilia (HCC) 02/13/2023   Generalized weakness 02/13/2023   Acute bronchitis 02/13/2023   Drug-induced liver injury 02/08/2023   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   Bleeding from right ear  12/25/2022   Chronic heart failure with preserved ejection fraction (HCC) 07/22/2022   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   Dizziness 04/09/2022   Encounter for general adult medical examination with abnormal findings 01/25/2022   Paroxysmal atrial fibrillation (HCC) 01/22/2022   Allergic dermatitis 09/12/2021   Herniated cervical disc 05/26/2017    Class: Acute   Cervical spinal stenosis 05/26/2017   Primary osteoarthritis of both hands 02/04/2017   History of gout 02/04/2017   DDD (degenerative disc disease), lumbar 02/04/2017   High risk medication use 01/28/2017   History of prostate cancer 01/28/2017   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   Rheumatoid arthritis (HCC) 01/04/2011   Coronary artery disease involving native coronary artery of native heart without angina pectoris 01/12/2010   Mixed hyperlipidemia 01/08/2010   Essential hypertension 01/08/2010    Past Medical History:  Diagnosis Date   Acute CVA (cerebrovascular accident) (HCC) 09/11/2023   CAD (coronary artery disease)    Acute myocardial infarction treated with TPA in 1990; 1999-stents to circumflex and RCA; residual total occlusion of the left anterior descending; normal ejection fraction; stress nuclear in 2001-distal anteroseptal ischemia; normal LV function   CKD (chronic kidney disease) stage 4, GFR 15-29 ml/min (HCC)    First degree heart block    History of gastric ulcer    History of kidney stones    History of MI (myocardial infarction)    1990-  treated w/ TPA   Hyperlipidemia    Hypertension    Jaundice    OA (osteoarthritis)    Prediabetes    Prostate cancer (HCC)    Stage T1c , Gleason 3+4,  PSA 4.7,  vol 24cc--  scheduled for radiative seed implants   Psoriatic arthritis (HCC)    RA (rheumatoid arthritis) (HCC)    Dr. Myla Artist   Sinus bradycardia    Wears dentures     Family History  Problem Relation Age of Onset   Heart attack  Mother    Coronary artery disease Father    Ulcerative colitis Father    Ulcers Father    Breast cancer Sister    ALS Sister    COPD Brother    Pancreatic cancer Brother    Diabetes Brother    Cirrhosis Son    Seizures Son    Past Surgical History:  Procedure Laterality Date   A/V FISTULAGRAM Left 04/20/2024   Procedure: A/V Fistulagram;  Surgeon: Margherita Shell, MD;  Location: [facility];  Service: Cardiovascular;  Laterality: Left;   AV FISTULA PLACEMENT Left 01/27/2024   Procedure: CREATION OF LEFT ARM ARTERIOVENOUS (AV) FISTULA;  Surgeon: Adine Hoof, MD;  Location: [facility];  Service: Vascular;  Laterality: Left;   BIOPSY  08/27/2019   Procedure: BIOPSY;  Surgeon: Ruby Corporal, MD;  [facility];  Service: Endoscopy;;  gastric   BIOPSY  04/03/2022   Procedure: BIOPSY;  Surgeon: Urban Garden, MD;  [facility];  Service: Gastroenterology;;   BIOPSY  04/21/2023   Procedure: BIOPSY;  Surgeon: Vinetta Greening, DO;  [facility];  Service: Endoscopy;;   callus removal Left 09/13/2022   left foot   CARDIOVASCULAR STRESS TEST  2001  per Dr Macdonald [place] clinic note   distal anteroseptal ischemia,  normal LVF   COLONOSCOPY  last one 2006 (approx)   COLONOSCOPY WITH PROPOFOL  N/A 04/03/2022   Procedure: COLONOSCOPY WITH PROPOFOL ;  Surgeon: Urban Garden, MD;  [facility];  Service: Gastroenterology;  Laterality: N/A;  945   CORONARY ANGIOPLASTY WITH STENT PLACEMENT  1999   DES x2  to CFX and RCA/  residual total occlusion LAD,  normal LVEF   ENTEROSCOPY N/A 04/24/2023   Procedure: ENTEROSCOPY;  Surgeon: Urban Garden, MD;  [facility];  Service: Gastroenterology;  Laterality: N/A;   ESOPHAGOGASTRODUODENOSCOPY (EGD) WITH PROPOFOL  N/A 08/27/2019   Procedure: ESOPHAGOGASTRODUODENOSCOPY (EGD) WITH PROPOFOL ;  Surgeon: Ruby Corporal, MD;  [facility];  Service:  Endoscopy;  Laterality: N/A;  10:10   ESOPHAGOGASTRODUODENOSCOPY (EGD) WITH PROPOFOL  N/A 04/03/2022   Procedure: ESOPHAGOGASTRODUODENOSCOPY (EGD) WITH PROPOFOL ;  Surgeon: Urban Garden, MD;  [facility];  Service: Gastroenterology;  Laterality: N/A;   ESOPHAGOGASTRODUODENOSCOPY (EGD) WITH PROPOFOL  N/A 02/16/2023   Procedure: ESOPHAGOGASTRODUODENOSCOPY (EGD) WITH PROPOFOL ;  Surgeon: Urban Garden, MD;  [facility];  Service: Gastroenterology;  Laterality: N/A;   ESOPHAGOGASTRODUODENOSCOPY (EGD) WITH PROPOFOL  N/A 04/21/2023   Procedure: ESOPHAGOGASTRODUODENOSCOPY (EGD) WITH PROPOFOL ;  Surgeon: Vinetta Greening, DO;  [facility];  Service: Endoscopy;  Laterality: N/A;   EYE SURGERY Bilateral    cataract   GIVENS CAPSULE STUDY N/A 04/22/2023   Procedure: GIVENS CAPSULE STUDY;  Surgeon: Urban Garden, MD;  [facility];  Service: Gastroenterology;  Laterality: N/A;   HEMOSTASIS CLIP PLACEMENT  04/24/2023   Procedure: HEMOSTASIS CLIP PLACEMENT;  Surgeon: Urban Garden, MD;  [facility];  Service: Gastroenterology;;   HOT HEMOSTASIS  04/03/2022   Procedure: HOT HEMOSTASIS (ARGON PLASMA COAGULATION/BICAP);  Surgeon: Umberto Ganong, Bearl Limes, MD;  [facility];  Service: Gastroenterology;;   HOT HEMOSTASIS  04/24/2023   Procedure: HOT HEMOSTASIS (ARGON PLASMA COAGULATION/BICAP);  Surgeon: Umberto Ganong, Bearl Limes, MD;  [facility];  Service: Gastroenterology;;   IR FLUORO GUIDE CV LINE RIGHT  05/30/2023   IR US  GUIDE VASC ACCESS RIGHT  05/30/2023   LIGATION OF ARTERIOVENOUS  FISTULA Left 04/20/2024   Procedure: BRANCH LIGATION OF LEFT ARM ARTERIOVENOUS FISTULA;  Surgeon: Philipp Brawn, MD;  Location: [facility] OR;  Service: Vascular;  Laterality: Left;  BRANCH LIGATION LEFT FISTULA  ARM   POLYPECTOMY  04/03/2022   Procedure: POLYPECTOMY;  Surgeon: Urban Garden, MD;  [facility];  Service: Gastroenterology;;   POSTERIOR CERVICAL FUSION/FORAMINOTOMY N/A 05/26/2017   Procedure: RIGHT C4-5 FORAMINOTOMY WITH EXCISION OF HERNIATED DISC;  Surgeon: Alphonso Jean, MD;  Location: [facility];  Service: Orthopedics;  Laterality: N/A;   RADIOACTIVE SEED IMPLANT N/A 01/11/2016   Procedure: RADIOACTIVE SEED IMPLANT/BRACHYTHERAPY IMPLANT;  Surgeon: Trent Frizzle, MD;  Location: [facility];  Service: Urology;  Laterality: N/A;   73  seeds implanted no seeds founds in bladder   SCLEROTHERAPY  04/24/2023   Procedure: SCLEROTHERAPY;  Surgeon: Umberto Ganong, Bearl Limes, MD;  [facility];  Service: Gastroenterology;;   SUBMUCOSAL TATTOO INJECTION  04/24/2023   Procedure: SUBMUCOSAL TATTOO INJECTION;  Surgeon: Urban Garden, MD;  [facility];  Service: Gastroenterology;;   Social History  Social History Narrative   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      Right handed   Wears readers   Drinks 1 cup in mornings   Drinks ginger ale   Immunization History  Administered Date(s) Administered   Fluad Quad(high Dose 65+) 09/10/2022, 09/14/2023   Hepb-cpg 06/11/2023, 07/02/2023, 08/06/2023, 10/01/2023   Influenza, High Dose Seasonal PF 09/12/2017, 08/28/2019, 08/28/2019   Influenza-Unspecified 08/08/2020, 08/24/2021, 08/24/2021   PFIZER(Purple Top)SARS-COV-2 Vaccination 01/22/2020, 02/12/2020, 08/15/2020, 01/19/2021, 09/14/2023   PNEUMOCOCCAL CONJUGATE-20 01/28/2022   Pfizer(Comirnaty)Fall Seasonal Vaccine 12 years and older 09/07/2023   Pneumococcal Polysaccharide-23 08/16/2009   Td 10/26/2021   Tdap 09/28/2021   Zoster Recombinant(Shingrix) 07/29/2019, 09/28/2021     Objective: Vital Signs: BP 116/61 (BP Location: Right Arm, Patient Position: Sitting, Cuff Size: Normal)   Pulse 76   Resp 17   Ht 5\' 10"  (1.778 m)   Wt 149 lb 6.4 oz (67.8 kg)   SpO2 97%   BMI 21.44 kg/m    Physical Exam Vitals and nursing  note reviewed.  Constitutional:      Appearance: He is well-developed.  HENT:     Head: Normocephalic and atraumatic.  Eyes:     Conjunctiva/sclera: Conjunctivae normal.     Pupils: Pupils are equal, round, and reactive to light.  Cardiovascular:     Rate and Rhythm: Normal rate and regular rhythm.     Heart sounds: Normal heart sounds.  Pulmonary:     Effort: Pulmonary effort is normal.     Breath sounds: Wheezing and rales present.  Abdominal:     General: Bowel sounds are normal.     Palpations: Abdomen is soft.  Musculoskeletal:     Cervical back: Normal range of motion and neck supple.  Skin:    General: Skin is warm and dry.     Capillary Refill: Capillary refill takes less than 2 seconds.  Neurological:     Mental Status: He is alert and oriented to person, place, and time.  Psychiatric:        Behavior: Behavior normal.      Musculoskeletal Exam: C-spine has limited ROM with lateral rotation.  Shoulder joints have good ROM.  Limited extension of the left elbow.  Right elbow has good ROM. Olecranon bursitis of the right elbow noted.  Wrist joints, MCPs, PIPs, and DIPs good ROM with no synovitis.  Complete fist formation bilaterally.  Knee joints have good ROM with no warmth or effusion.  Ankle joints have good ROM with no tenderness or joint swelling.   CDAI Exam: CDAI Score: -- Patient Global: --; Provider Global: -- Swollen: --; Tender: -- Joint Exam 05/18/2024   No joint exam has been documented for this visit   There is currently no information documented on the homunculus. Go to the Rheumatology activity and complete the homunculus joint exam.  Investigation: No additional findings.  Imaging: PERIPHERAL VASCULAR CATHETERIZATION Result Date: 04/20/2024 Images from the original result were not included. Patient name: KAIREN HALLINAN MRN: [id number] DOB: 1946/01/03 Sex: male 04/20/2024 Pre-operative Diagnosis: None maturing left brachiocephalic fistula Post-operative  diagnosis:  Same Surgeon:  Gareld June Procedure Performed:  1.  Ultrasound-guided access, left cephalic vein  2.  Fistulogram  3.  Conscious sedation, 17 minutes Indications: This is a 78 year old gentleman with end-stage renal disease and a left brachiocephalic fistula that has not fully matured.  He comes today for further evaluation. Procedure:  The patient was identified in the holding area and taken to room 8.  The patient was then placed supine on the table and prepped and draped in the usual sterile fashion.  A time out was called.  Conscious sedation was administered with the use of IV fentanyl  and Versed  under continuous physician and nurse monitoring.  Heart rate, blood pressure, and oxygen saturations were continuously monitored.  Total sedation time was 17 minutes ultrasound was used to evaluate the fistula.  The vein was patent and compressible.  A digital ultrasound image was acquired.  The fistula was then accessed under ultrasound guidance using a micropuncture needle.  An 018 wire was then asvanced without resistance and a micropuncture sheath was placed.  Contrast injections were then performed through the sheath. Findings: No evidence of central venous stenosis.  The cephalic vein is widely patent throughout the upper arm.  There are 2 competing branches.  The arteriovenous anastomosis is widely patent  Impression:  #1  Widely patent left brachiocephalic fistula  #2  Competing branches that will require surgical ligation V. Gareld June, M.D., FACS [facility] Office: [phone] Pager:  [phone]    Recent Labs: Lab Results  Component Value Date   WBC 5.4 05/11/2024   HGB 10.3 (L) 05/11/2024   PLT 235 05/11/2024   NA 135 05/11/2024   K 3.2 (L) 05/11/2024   CL 91 (L) 05/11/2024   CO2 26 05/11/2024   GLUCOSE 138 (H) 05/11/2024   BUN 24 05/11/2024   CREATININE 4.87 (H) 05/11/2024   BILITOT 0.2 05/11/2024   ALKPHOS 135 (H) 05/11/2024   AST 30  05/11/2024   ALT 14 05/11/2024   PROT 6.5 05/11/2024   ALBUMIN 3.6 (L) 05/11/2024   CALCIUM  8.4 (L) 05/11/2024   GFRAA 27 (L) 06/14/2021    Speciality Comments: No specialty comments available.  Procedures:  No procedures performed Allergies: Patient has no known allergies.    Assessment / Plan:     Visit Diagnoses: Seronegative rheumatoid arthritis (HCC): He has no joint tenderness or synovitis on examination today.  He has not had any signs or symptoms of a rheumatoid arthritis flare.  He has not been experiencing any morning stiffness, nocturnal pain, or difficulty performing ADLs.  He remains on Areva 10 mg 1 tablet by mouth daily.  He is tolerating Arava  without any side effects and has not had any recent gaps in therapy.  Patient is currently experiencing a cough, wheezing, and intermittent shortness of breath.  He will be evaluated by his PCP today.  If his cough is felt to be due to an infectious etiology he was advised to hold Arava  until the infection has completely cleared.  He voiced understanding. Discussed the importance of always holding arava  if he develops signs or symptoms of an infection and to resume once infection has completely cleared.  He will notify us  if he develops any signs or symptoms of a flare.  He will follow-up in the office in 5 months or sooner if needed.  High risk medication use - Arava  10 mg 1 tablet by mouth daily.   CBC and CMP updated on 05/11/24.  On dialysis.   Discussed the importance of holding arava  if he develops signs or symptoms of an infection and to resume once the infection has completely cleared.   Stiffness of both knees: No warmth or effusion noted today.   Primary osteoarthritis of both hands: He is having DIP thickening consistent with osteoarthritis of both hands.  No synovitis was noted on examination today.  Degeneration of intervertebral  disc of lumbar region without discogenic back pain or lower extremity pain - Previously  followed by Dr. Richardo Chandler.facet joint injections on 09/09/2022. radiofrequency ablation in the lumbar spine on 10/15/2022 performed by Dr. Daisey Dryer.  Updated MRI of the lumbar spine on 02/14/2024: No acute findings or clear explanation for the patient's symptoms.  Progressive spondylosis at L2-L3 with mild progressive lateral recess and foraminal narrowing bilaterally.  Stable chronic moderate foraminal narrowing at L5-S1. Patient continues to have difficulty standing for prolonged peers of time due to the discomfort in his lower back.  Idiopathic chronic gout of multiple sites without tophus - He has not had any signs or symptoms of a gout flare.  He has clinically been doing well taking allopurinol  50 mg every other day-cleared to continue per Dr. Carrolyn Clan.  Uric acid was 3.1 on 12/04/2023.  Subacute cough: Cough since mid-April. Chest x-ray from 04/04/2024 revealed chronic bronchial wall thickening.  No acute findings noted.  He was treated with a Z-Pak for bronchitis but has had a recurrence of a dry cough, wheezing, and SOB.  Patient has not been holding Abreva during this time.  He is following up with his PCP today for further evaluation.  Wheezing and rales were noted in bilateral lungs today.  Patient was advised to hold Arava  if the cough is felt to be due to an infectious etiology.  ESRD (end stage renal disease) (HCC) - Followed closely by nephrology-by Dr. Carrolyn Clan.  Patient is receiving dialysis on Monday, Wednesday, and Fridays.  Other medical conditions are listed as follows:   History of hypercholesterolemia: Lipid panel updated on 09/12/23.   History of hypertension: BP was 116/61 today in the office.    History of gastroesophageal reflux (GERD)  History of coronary artery disease  History of prostate cancer  History of renal insufficiency syndrome  History of CVA (cerebrovascular accident) - 09/11/2023--presented to ED with left-sided numbness and weakness--given TNK.  Currently taking  aspirin  81 mg daily.  Completed home therapy.  Orders: No orders of the defined types were placed in this encounter.  No orders of the defined types were placed in this encounter.   Follow-Up Instructions: Return in about 5 months (around 10/18/2024) for Rheumatoid arthritis, Osteoarthritis.   Romayne Clubs, PA-C  Note - This record has been created using Dragon software.  Chart creation errors have been sought, but may not always  have been located. Such creation errors do not reflect on  the standard of medical care.

## 2024-05-05 DIAGNOSIS — Z992 Dependence on renal dialysis: Secondary | ICD-10-CM | POA: Diagnosis not present

## 2024-05-05 DIAGNOSIS — N186 End stage renal disease: Secondary | ICD-10-CM | POA: Diagnosis not present

## 2024-05-05 DIAGNOSIS — N2581 Secondary hyperparathyroidism of renal origin: Secondary | ICD-10-CM | POA: Diagnosis not present

## 2024-05-07 ENCOUNTER — Ambulatory Visit: Payer: Self-pay

## 2024-05-07 ENCOUNTER — Telehealth: Payer: Self-pay | Admitting: Rheumatology

## 2024-05-07 ENCOUNTER — Other Ambulatory Visit: Payer: Self-pay | Admitting: *Deleted

## 2024-05-07 DIAGNOSIS — Z992 Dependence on renal dialysis: Secondary | ICD-10-CM | POA: Diagnosis not present

## 2024-05-07 DIAGNOSIS — Z79899 Other long term (current) drug therapy: Secondary | ICD-10-CM

## 2024-05-07 DIAGNOSIS — N186 End stage renal disease: Secondary | ICD-10-CM | POA: Diagnosis not present

## 2024-05-07 DIAGNOSIS — N2581 Secondary hyperparathyroidism of renal origin: Secondary | ICD-10-CM | POA: Diagnosis not present

## 2024-05-07 NOTE — Telephone Encounter (Signed)
 Lab Orders released for patient.

## 2024-05-07 NOTE — Telephone Encounter (Signed)
 If the patient did not go to urgent care as directed, pls schedule acute visit with laura

## 2024-05-07 NOTE — Telephone Encounter (Signed)
  Chief Complaint: sore on heel Symptoms: nickel sized sore on foot Frequency: x 2 weeks Pertinent Negatives: Patient denies streaking, fever, purulent drainage Disposition: [] ED /[x] Urgent Care (no appt availability in office) / [] Appointment(In office/virtual)/ []  [facility]/ [] Home Care/ [] Refused Recommended Disposition /[] [facility]/ []  Follow-up with PCP Additional Notes: no PCP appointment available, recommended UC. Dialysis patient with sore on foot Copied from CRM [id number]. Topic: Clinical - Red Word Triage >> May 07, 2024  9:48 AM Oddis Bench wrote: Red Word that prompted transfer to Nurse Triage: Patient wife is on the line patient has large sore on his foot and it is hurting and has been bleeding and the wound is deep. The patient is on dialiysis Reason for Disposition  Looks like a boil or deep ulcer  Answer Assessment - Initial Assessment Questions 1. APPEARANCE of SORES: "What do the sores look like?"     Round, looks like calous, left heel 2. NUMBER: "How many sores are there?"     one 3. SIZE: "How big is the largest sore?"     Nickel, getting larger 5. ONSET: "When did the sores begin?"     2 weeks ago 6. TENDER: "Does it hurt when you touch it?"  (Scale 1-10; or mild, moderate, severe)      sore  8. OTHER SYMPTOMS: "Do you have any other symptoms?" (e.g., fever, new weakness)     Dialysis patient  Protocols used: Sores-A-AH

## 2024-05-07 NOTE — Telephone Encounter (Signed)
 Patient contacted the office requesting lab orders to be be release to [facility] on [address] in [place]  Patient plans to have labs on Tuesday, 05/11/24.

## 2024-05-10 DIAGNOSIS — N186 End stage renal disease: Secondary | ICD-10-CM | POA: Diagnosis not present

## 2024-05-10 DIAGNOSIS — N2581 Secondary hyperparathyroidism of renal origin: Secondary | ICD-10-CM | POA: Diagnosis not present

## 2024-05-10 DIAGNOSIS — Z992 Dependence on renal dialysis: Secondary | ICD-10-CM | POA: Diagnosis not present

## 2024-05-11 DIAGNOSIS — Z79899 Other long term (current) drug therapy: Secondary | ICD-10-CM | POA: Diagnosis not present

## 2024-05-11 NOTE — Telephone Encounter (Signed)
 Called patient left a voicemail to call office to schedule an appointment

## 2024-05-11 NOTE — Telephone Encounter (Signed)
 Scheduled for  06.03.2025 if any cancellations for Thursday, 05.29 call patient

## 2024-05-12 ENCOUNTER — Ambulatory Visit: Payer: Self-pay | Admitting: Rheumatology

## 2024-05-12 ENCOUNTER — Other Ambulatory Visit (HOSPITAL_BASED_OUTPATIENT_CLINIC_OR_DEPARTMENT_OTHER): Payer: Self-pay

## 2024-05-12 DIAGNOSIS — Z992 Dependence on renal dialysis: Secondary | ICD-10-CM | POA: Diagnosis not present

## 2024-05-12 DIAGNOSIS — N2581 Secondary hyperparathyroidism of renal origin: Secondary | ICD-10-CM | POA: Diagnosis not present

## 2024-05-12 DIAGNOSIS — N186 End stage renal disease: Secondary | ICD-10-CM | POA: Diagnosis not present

## 2024-05-12 LAB — CMP14+EGFR
ALT: 14 IU/L (ref 0–44)
AST: 30 IU/L (ref 0–40)
Albumin: 3.6 g/dL — ABNORMAL LOW (ref 3.8–4.8)
Alkaline Phosphatase: 135 IU/L — ABNORMAL HIGH (ref 44–121)
BUN/Creatinine Ratio: 5 — ABNORMAL LOW (ref 10–24)
BUN: 24 mg/dL (ref 8–27)
Bilirubin Total: 0.2 mg/dL (ref 0.0–1.2)
CO2: 26 mmol/L (ref 20–29)
Calcium: 8.4 mg/dL — ABNORMAL LOW (ref 8.6–10.2)
Chloride: 91 mmol/L — ABNORMAL LOW (ref 96–106)
Creatinine, Ser: 4.87 mg/dL — ABNORMAL HIGH (ref 0.76–1.27)
Globulin, Total: 2.9 g/dL (ref 1.5–4.5)
Glucose: 138 mg/dL — ABNORMAL HIGH (ref 70–99)
Potassium: 3.2 mmol/L — ABNORMAL LOW (ref 3.5–5.2)
Sodium: 135 mmol/L (ref 134–144)
Total Protein: 6.5 g/dL (ref 6.0–8.5)
eGFR: 11 mL/min/{1.73_m2} — ABNORMAL LOW (ref >59)

## 2024-05-12 LAB — CBC WITH DIFFERENTIAL/PLATELET
Basophils Absolute: 0 10*3/uL (ref 0.0–0.2)
Basos: 1 %
EOS (ABSOLUTE): 0.2 10*3/uL (ref 0.0–0.4)
Eos: 4 %
Hematocrit: 32.9 % — ABNORMAL LOW (ref 37.5–51.0)
Hemoglobin: 10.3 g/dL — ABNORMAL LOW (ref 13.0–17.7)
Immature Grans (Abs): 0 10*3/uL (ref 0.0–0.1)
Immature Granulocytes: 0 %
Lymphocytes Absolute: 1.2 10*3/uL (ref 0.7–3.1)
Lymphs: 22 %
MCH: 29.3 pg (ref 26.6–33.0)
MCHC: 31.3 g/dL — ABNORMAL LOW (ref 31.5–35.7)
MCV: 94 fL (ref 79–97)
Monocytes Absolute: 0.7 10*3/uL (ref 0.1–0.9)
Monocytes: 12 %
Neutrophils Absolute: 3.3 10*3/uL (ref 1.4–7.0)
Neutrophils: 61 %
Platelets: 235 10*3/uL (ref 150–450)
RBC: 3.52 x10E6/uL — ABNORMAL LOW (ref 4.14–5.80)
RDW: 14.2 % (ref 11.6–15.4)
WBC: 5.4 10*3/uL (ref 3.4–10.8)

## 2024-05-12 NOTE — Progress Notes (Signed)
 Glucose is elevated at 138, creatinine is elevated at 4.87 with GFR low at 11.  Hemoglobin is low at 10.3 due to kidney disease.  Please forward results to his PCP and nephrologist.

## 2024-05-14 DIAGNOSIS — N2581 Secondary hyperparathyroidism of renal origin: Secondary | ICD-10-CM | POA: Diagnosis not present

## 2024-05-14 DIAGNOSIS — N186 End stage renal disease: Secondary | ICD-10-CM | POA: Diagnosis not present

## 2024-05-14 DIAGNOSIS — Z992 Dependence on renal dialysis: Secondary | ICD-10-CM | POA: Diagnosis not present

## 2024-05-16 DIAGNOSIS — Z992 Dependence on renal dialysis: Secondary | ICD-10-CM | POA: Diagnosis not present

## 2024-05-16 DIAGNOSIS — N186 End stage renal disease: Secondary | ICD-10-CM | POA: Diagnosis not present

## 2024-05-17 ENCOUNTER — Ambulatory Visit: Attending: Surgery | Admitting: Physician Assistant

## 2024-05-17 ENCOUNTER — Encounter: Payer: Self-pay | Admitting: Physician Assistant

## 2024-05-17 VITALS — BP 169/66 | HR 68 | Temp 98.0°F | Ht 70.0 in | Wt 153.8 lb

## 2024-05-17 DIAGNOSIS — N2581 Secondary hyperparathyroidism of renal origin: Secondary | ICD-10-CM | POA: Diagnosis not present

## 2024-05-17 DIAGNOSIS — Z992 Dependence on renal dialysis: Secondary | ICD-10-CM | POA: Diagnosis not present

## 2024-05-17 DIAGNOSIS — N186 End stage renal disease: Secondary | ICD-10-CM | POA: Diagnosis not present

## 2024-05-17 NOTE — Progress Notes (Signed)
 POST OPERATIVE OFFICE NOTE    CC:  F/u for surgery  HPI:  This is a 78 y.o. male who is s/p left AVF branch ligation on 04/20/2024 by Dr. Susi Eric.  He originally underwent left BC AVF creation on 01/27/2024 by Dr. Vikki Graves.    He has a TDC that was placed by interventional radiology on 05/30/2023.  Pt states he does not have pain/numbness in the left hand.    The pt is on dialysis M/W/F at [facility] location.   No Known Allergies  Current Outpatient Medications  Medication Sig Dispense Refill   albuterol  (VENTOLIN  HFA) 108 (90 Base) MCG/ACT inhaler Inhale 2 puffs into the lungs every 4 (four) hours as needed. 18 g 0   allopurinol  (ZYLOPRIM ) 100 MG tablet TAKE 1/2 TABLET EVERY OTHER DAY 22 tablet 3   amLODipine  (NORVASC ) 5 MG tablet TAKE 1 TABLET EVERY DAY 90 tablet 3   apixaban  (ELIQUIS ) 2.5 MG TABS tablet Take 1 tablet (2.5 mg total) by mouth 2 (two) times daily. 60 tablet 11   aspirin  EC 81 MG tablet Take 81 mg by mouth in the morning. Swallow whole.     calcitRIOL  (ROCALTROL ) 0.25 MCG capsule Take 0.25 mcg by mouth every Monday, Wednesday, and Friday. At dialysis     Dupilumab  (DUPIXENT ) 300 MG/2ML SOAJ Inject 300 mg into the skin every 14 (fourteen) days. Starting at day 15 for maintenance. 12 mL 3   folic acid  (FOLVITE ) 400 MCG tablet Take 400 mcg by mouth in the morning.     HYDROcodone -acetaminophen  (NORCO/VICODIN) 5-325 MG tablet Take 1 tablet by mouth every 6 (six) hours as needed for moderate pain (pain score 4-6). 8 tablet 0   leflunomide  (ARAVA ) 10 MG tablet TAKE 1 TABLET EVERY DAY 90 tablet 0   Leuprolide  Acetate (ELIGARD  Crystal) Inject 1 Dose into the skin every 4 (four) months.     pantoprazole  (PROTONIX ) 40 MG tablet TAKE 1 TABLET TWICE DAILY 180 tablet 3   RENVELA  800 MG tablet Take 800 mg by mouth 2 (two) times daily with a meal.     rosuvastatin  (CRESTOR ) 40 MG tablet Take 1 tablet (40 mg total) by mouth daily. (Patient taking differently: Take 40 mg by mouth every  evening.) 90 tablet 3   triamcinolone  cream (KENALOG ) 0.1 % Apply 1 Application topically daily as needed (irritation). (Patient taking differently: Apply 1 Application topically 3 (three) times a week.)     No current facility-administered medications for this visit.     ROS:  See HPI  Physical Exam:  Today's Vitals   05/17/24 0814  BP: (!) 169/66  Pulse: 68  Temp: 98 F (36.7 C)  TempSrc: Temporal  SpO2: 98%  Weight: 153 lb 12.8 oz (69.8 kg)  Height: 5\' 10"  (1.778 m)  PainSc: 0-No pain   Body mass index is 22.07 kg/m.   Incision:  all incisions are well healed Extremities:   There is a palpable left radial pulse.   Motor and sensory are in tact.   There is a thrill/bruit present.  Access is  easily palpable    Assessment/Plan:  This is a 78 y.o. male who is s/p:  left AVF branch ligation on 04/20/2024 by Dr. Susi Eric.  He originally underwent left BC AVF creation on 01/27/2024 by Dr. Vikki Graves.    -the pt does not have evidence of steal.  -his incisions are well healed.  His fistula has an excellent thrill.   -pt's access can be used now. -if pt  has tunneled catheter, this can be removed at the discretion of the dialysis center once the pt's access has been successfully cannulated to their satisfaction.  -discussed with pt that access does not last forever and will need intervention or even new access at some point.  -the pt will follow up as needed.    Maryanna Smart, [facility] [phone]  Clinic MD:  Susi Eric on call MD

## 2024-05-18 ENCOUNTER — Other Ambulatory Visit: Payer: Medicare HMO

## 2024-05-18 ENCOUNTER — Encounter: Payer: Self-pay | Admitting: Physician Assistant

## 2024-05-18 ENCOUNTER — Ambulatory Visit: Payer: Medicare HMO | Attending: Physician Assistant | Admitting: Physician Assistant

## 2024-05-18 ENCOUNTER — Ambulatory Visit

## 2024-05-18 VITALS — BP 116/61 | HR 76 | Resp 17 | Ht 70.0 in | Wt 149.4 lb

## 2024-05-18 VITALS — BP 104/57 | HR 76 | Ht 70.0 in | Wt 155.0 lb

## 2024-05-18 DIAGNOSIS — M1A09X0 Idiopathic chronic gout, multiple sites, without tophus (tophi): Secondary | ICD-10-CM

## 2024-05-18 DIAGNOSIS — Z8639 Personal history of other endocrine, nutritional and metabolic disease: Secondary | ICD-10-CM | POA: Diagnosis not present

## 2024-05-18 DIAGNOSIS — M19042 Primary osteoarthritis, left hand: Secondary | ICD-10-CM

## 2024-05-18 DIAGNOSIS — Z8673 Personal history of transient ischemic attack (TIA), and cerebral infarction without residual deficits: Secondary | ICD-10-CM

## 2024-05-18 DIAGNOSIS — M51369 Other intervertebral disc degeneration, lumbar region without mention of lumbar back pain or lower extremity pain: Secondary | ICD-10-CM

## 2024-05-18 DIAGNOSIS — M19041 Primary osteoarthritis, right hand: Secondary | ICD-10-CM

## 2024-05-18 DIAGNOSIS — N186 End stage renal disease: Secondary | ICD-10-CM | POA: Diagnosis not present

## 2024-05-18 DIAGNOSIS — M25661 Stiffness of right knee, not elsewhere classified: Secondary | ICD-10-CM

## 2024-05-18 DIAGNOSIS — R052 Subacute cough: Secondary | ICD-10-CM | POA: Diagnosis not present

## 2024-05-18 DIAGNOSIS — M0600 Rheumatoid arthritis without rheumatoid factor, unspecified site: Secondary | ICD-10-CM | POA: Diagnosis not present

## 2024-05-18 DIAGNOSIS — L97511 Non-pressure chronic ulcer of other part of right foot limited to breakdown of skin: Secondary | ICD-10-CM

## 2024-05-18 DIAGNOSIS — Z87448 Personal history of other diseases of urinary system: Secondary | ICD-10-CM

## 2024-05-18 DIAGNOSIS — M25662 Stiffness of left knee, not elsewhere classified: Secondary | ICD-10-CM

## 2024-05-18 DIAGNOSIS — Z79899 Other long term (current) drug therapy: Secondary | ICD-10-CM

## 2024-05-18 DIAGNOSIS — Z8719 Personal history of other diseases of the digestive system: Secondary | ICD-10-CM

## 2024-05-18 DIAGNOSIS — Z8546 Personal history of malignant neoplasm of prostate: Secondary | ICD-10-CM

## 2024-05-18 DIAGNOSIS — Z8679 Personal history of other diseases of the circulatory system: Secondary | ICD-10-CM

## 2024-05-18 MED ORDER — ALBUTEROL SULFATE HFA 108 (90 BASE) MCG/ACT IN AERS: 2.0000 | INHALATION_SPRAY | RESPIRATORY_TRACT | 0 refills | Status: DC | PRN

## 2024-05-18 MED ORDER — PROMETHAZINE-DM 6.25-15 MG/5ML PO SYRP
5.0000 mL | ORAL_SOLUTION | Freq: Four times a day (QID) | ORAL | 0 refills | Status: DC | PRN
Start: 2024-05-18 — End: 2024-06-11

## 2024-05-18 MED ORDER — DOXYCYCLINE HYCLATE 100 MG PO TABS: 100.0000 mg | ORAL_TABLET | Freq: Two times a day (BID) | ORAL | 0 refills | Status: AC

## 2024-05-18 NOTE — Progress Notes (Unsigned)
   Established Patient Office Visit  Subjective   Patient ID: Jacob Farmer, male    DOB: [birth year]  Age: 78 y.o. MRN: [id number]  Chief Complaint  Patient presents with   Medical Management of Chronic Issues    Pt here for "Wound on foot, and congestion."    HPI  {History (Optional):23778}  ROS    Objective:     BP (!) 104/57   Pulse 76   Ht 5\' 10"  (1.778 m)   Wt 155 lb (70.3 kg)   SpO2 93%   BMI 22.24 kg/m  {Vitals History (Optional):23777}  Physical Exam   No results found for any visits on 05/18/24.  {Labs (Optional):23779}  The ASCVD Risk score (Arnett DK, et al., 2019) failed to calculate for the following reasons:   Risk score cannot be calculated because patient has a medical history suggesting prior/existing ASCVD    Assessment & Plan:   Problem List Items Addressed This Visit   None   No follow-ups on file.    Alison Irvine, FNP

## 2024-05-18 NOTE — Patient Instructions (Signed)
 Standing Labs We placed an order today for your standing lab work.   Please have your standing labs drawn in August and every 3 months   Please have your labs drawn 2 weeks prior to your appointment so that the provider can discuss your lab results at your appointment, if possible.  Please note that you may see your imaging and lab results in MyChart before we have reviewed them. We will contact you once all results are reviewed. Please allow our office up to 72 hours to thoroughly review all of the results before contacting the office for clarification of your results.  WALK-IN LAB HOURS  Monday through Thursday from 8:00 am -12:30 pm and 1:00 pm-4:00 pm and Friday from 8:00 am-12:00 pm.  Patients with office visits requiring labs will be seen before walk-in labs.  You may encounter longer than normal wait times. Please allow additional time. Wait times may be shorter on  Monday and Thursday afternoons.  We do not book appointments for walk-in labs. We appreciate your patience and understanding with our staff.   Labs are drawn by [facility]. Please bring your co-pay at the time of your lab draw.  You may receive a bill from [facility] for your lab work.  Please note if you are on Hydroxychloroquine  and and an order has been placed for a Hydroxychloroquine  level,  you will need to have it drawn 4 hours or more after your last dose.  If you wish to have your labs drawn at another location, please call the office 24 hours in advance so we can fax the orders.  The office is located at [address], [address], [place], [place] [address]   If you have any questions regarding directions or hours of operation,  please call [phone].   As a reminder, please drink plenty of water prior to coming for your lab work. Thanks!

## 2024-05-19 DIAGNOSIS — Z992 Dependence on renal dialysis: Secondary | ICD-10-CM | POA: Diagnosis not present

## 2024-05-19 DIAGNOSIS — N2581 Secondary hyperparathyroidism of renal origin: Secondary | ICD-10-CM | POA: Diagnosis not present

## 2024-05-19 DIAGNOSIS — N186 End stage renal disease: Secondary | ICD-10-CM | POA: Diagnosis not present

## 2024-05-20 ENCOUNTER — Telehealth: Payer: Self-pay

## 2024-05-20 ENCOUNTER — Other Ambulatory Visit: Payer: Medicare HMO

## 2024-05-20 ENCOUNTER — Encounter: Payer: Self-pay | Admitting: Student

## 2024-05-20 ENCOUNTER — Ambulatory Visit: Attending: Student | Admitting: Student

## 2024-05-20 VITALS — BP 130/52 | HR 75 | Ht 70.0 in | Wt 150.0 lb

## 2024-05-20 DIAGNOSIS — K922 Gastrointestinal hemorrhage, unspecified: Secondary | ICD-10-CM

## 2024-05-20 DIAGNOSIS — R052 Subacute cough: Secondary | ICD-10-CM | POA: Insufficient documentation

## 2024-05-20 DIAGNOSIS — I480 Paroxysmal atrial fibrillation: Secondary | ICD-10-CM

## 2024-05-20 DIAGNOSIS — Z992 Dependence on renal dialysis: Secondary | ICD-10-CM

## 2024-05-20 DIAGNOSIS — I10 Essential (primary) hypertension: Secondary | ICD-10-CM

## 2024-05-20 DIAGNOSIS — D6869 Other thrombophilia: Secondary | ICD-10-CM

## 2024-05-20 DIAGNOSIS — Z8719 Personal history of other diseases of the digestive system: Secondary | ICD-10-CM

## 2024-05-20 DIAGNOSIS — N186 End stage renal disease: Secondary | ICD-10-CM

## 2024-05-20 DIAGNOSIS — Z8673 Personal history of transient ischemic attack (TIA), and cerebral infarction without residual deficits: Secondary | ICD-10-CM

## 2024-05-20 NOTE — Progress Notes (Signed)
  Electrophysiology Office Note:   Date:  05/20/2024  ID:  Jacob Farmer, DOB [birth year], MRN [id number]  Primary Cardiologist: Armida Lander, MD Electrophysiologist: Ardeen Kohler, MD      History of Present Illness:   Jacob Farmer is a 78 y.o. male with h/o h/o coronary disease, paroxysmal atrial fibrillation, tachybradycardia syndrome, HFpEF, type 2 diabetes, essential hypertension with bouts of orthostatic hypotension, rheumatoid and osteoarthritis, end-stage renal disease, prostate cancer, GI bleeding and stroke seen today for routine electrophysiology followup.   Since last being seen in our clinic the patient reports doing OK. Getting over a URI, finished ABx Monday. Fistula revised and working OK this week. Tentatively planning removal of HD cath pending ability to use fistula. He understands that LAAO will be delayed if HD cath is not removed. Otherwise, no new complaints.   Review of systems complete and found to be negative unless listed in HPI.   EP Information / Studies Reviewed:    EKG is ordered today. Personal review as below.  EKG Interpretation Date/Time:  Thursday May 20 2024 10:22:33 EDT Ventricular Rate:  75 PR Interval:  200 QRS Duration:  154 QT Interval:  462 QTC Calculation: 515 R Axis:   -74  Text Interpretation: Sinus rhythm with Premature atrial complexes Right bundle branch block Left anterior fascicular block Bifascicular block Anteroseptal infarct , age undetermined When compared with ECG of 13-Feb-2024 16:55, PREVIOUS ECG IS PRESENT Confirmed by Pilar Bridge [phone]) on 05/20/2024 10:32:23 AM    Arrhythmia/Device History No specialty comments available.   Physical Exam:   VS:  BP (!) 130/52   Pulse 75   Ht 5\' 10"  (1.778 m)   Wt 150 lb (68 kg)   SpO2 92%   BMI 21.52 kg/m    Wt Readings from Last 3 Encounters:  05/20/24 150 lb (68 kg)  05/18/24 155 lb (70.3 kg)  05/18/24 149 lb 6.4 oz (67.8 kg)     GEN: No acute distress NECK: No JVD;  No carotid bruits CARDIAC: Regular rate and rhythm with occasional ectopy, no murmurs, rubs, gallops RESPIRATORY:  Clear to auscultation without rales, wheezing or rhonchi  ABDOMEN: Soft, non-tender, non-distended EXTREMITIES:  No edema; No deformity   ASSESSMENT AND PLAN:    Paroxysmal AF Secondary hypercoagulable state due to AF NSR today.  Continue eliquis  2.5 mg BID for CHA2DS2/VASc of 8 Hopeful to proceed with LAAO with Watchman once HD cath is removed.  Reviewed instructions and letter today.  Pt will have TEE same day given ESRD.   H/o GI bleeding H/o CVA Pt will benefit from stroke protection and being able to come off OAC with Watchman  ESRD Hopeful that HD cath can be removed prior to 6/19; If not will need to delay Watchman.   Follow up with Dr. Daneil Dunker as usual post procedure  Signed, Tylene Galla, PA-C

## 2024-05-20 NOTE — Telephone Encounter (Signed)
 Spoke with the patient at length. His fistula was not working correctly on Monday, but Wednesday it worked fine. They have not removed his catheter yet because they want to give his fistula a couple more sessions to ensure it's functioning correctly prior to removal. Will call the patient on Monday afternoon after his dialysis session for an update. He was told if it works correctly, it will be removed next week. He was grateful for call and agreed with plan.   MWF dialysis, 1000-1400. Will call late PM 6/9.

## 2024-05-20 NOTE — Assessment & Plan Note (Signed)
 Add oral doxycycline  and cough medication.  Albuterol  refilled for as needed relief of wheezing.  Recommend taking Mucinex  during the day and increasing water  intake.  Recommend repeating chest x-ray if symptoms are not improving.

## 2024-05-20 NOTE — Patient Instructions (Signed)
 Medication Instructions:  No med changes today   *If you need a refill on your cardiac medications before your next appointment, please call your pharmacy*  Lab Work: BMET and CBC today If you have labs (blood work) drawn today and your tests are completely normal, you will receive your results only by: MyChart Message (if you have MyChart) OR A paper copy in the mail If you have any lab test that is abnormal or we need to change your treatment, we will call you to review the results.  Testing/Procedures: You are tentatively scheduled for [facility] 6/19. See Letter for instructions.   Follow-Up: At [facility], you and your health needs are our priority.  As part of our continuing mission to provide you with exceptional heart care, our providers are all part of one team.  This team includes your primary Cardiologist (physician) and Advanced Practice Providers or APPs (Physician Assistants and Nurse Practitioners) who all work together to provide you with the care you need, when you need it.  Your next appointment:   To follow Watchman Procedure  Provider:   You may see Ardeen Kohler, MD or one of the following Advanced Practice Providers on your designated Care Team:   Mertha Abrahams, Kennard Pea "Jonelle Neri" [place], PA-C Suzann Riddle, NP Creighton Doffing, NP    We recommend signing up for the patient portal called "MyChart".  Sign up information is provided on this After Visit Summary.  MyChart is used to connect with patients for Virtual Visits (Telemedicine).  Patients are able to view lab/test results, encounter notes, upcoming appointments, etc.  Non-urgent messages can be sent to your provider as well.   To learn more about what you can do with MyChart, go to [email or web address].

## 2024-05-21 ENCOUNTER — Ambulatory Visit: Payer: Self-pay | Admitting: *Deleted

## 2024-05-21 DIAGNOSIS — Z992 Dependence on renal dialysis: Secondary | ICD-10-CM | POA: Diagnosis not present

## 2024-05-21 DIAGNOSIS — N186 End stage renal disease: Secondary | ICD-10-CM | POA: Diagnosis not present

## 2024-05-21 DIAGNOSIS — N2581 Secondary hyperparathyroidism of renal origin: Secondary | ICD-10-CM | POA: Diagnosis not present

## 2024-05-21 LAB — LAB REPORT - SCANNED
Calcium: 8.9
PTH: 151

## 2024-05-24 DIAGNOSIS — N2581 Secondary hyperparathyroidism of renal origin: Secondary | ICD-10-CM | POA: Diagnosis not present

## 2024-05-24 DIAGNOSIS — Z992 Dependence on renal dialysis: Secondary | ICD-10-CM | POA: Diagnosis not present

## 2024-05-24 DIAGNOSIS — N186 End stage renal disease: Secondary | ICD-10-CM | POA: Diagnosis not present

## 2024-05-24 LAB — BASIC METABOLIC PANEL WITH GFR
BUN/Creatinine Ratio: 4 — ABNORMAL LOW (ref 10–24)
BUN: 21 mg/dL (ref 8–27)
CO2: 27 mmol/L (ref 20–29)
Calcium: 8.8 mg/dL (ref 8.6–10.2)
Chloride: 91 mmol/L — ABNORMAL LOW (ref 96–106)
Creatinine, Ser: 4.78 mg/dL — ABNORMAL HIGH (ref 0.76–1.27)
Glucose: 103 mg/dL — ABNORMAL HIGH (ref 70–99)
Potassium: 3.3 mmol/L — ABNORMAL LOW (ref 3.5–5.2)
Sodium: 137 mmol/L (ref 134–144)
eGFR: 12 mL/min/{1.73_m2} — ABNORMAL LOW (ref >59)

## 2024-05-24 LAB — CBC
Hematocrit: 33 % — ABNORMAL LOW (ref 37.5–51.0)
Hemoglobin: 10.4 g/dL — ABNORMAL LOW (ref 13.0–17.7)
MCH: 29.4 pg (ref 26.6–33.0)
MCHC: 31.5 g/dL (ref 31.5–35.7)
MCV: 93 fL (ref 79–97)
Platelets: 251 10*3/uL (ref 150–450)
RBC: 3.54 x10E6/uL — ABNORMAL LOW (ref 4.14–5.80)
RDW: 14.1 % (ref 11.6–15.4)
WBC: 6.5 10*3/uL (ref 3.4–10.8)

## 2024-05-24 NOTE — Telephone Encounter (Signed)
 The patient reported his catheter was not removed today and he is not sure when they will take it out. It was agreed he would have one more session on Wednesday to see if there is a plan in place for removal prior to postponing to July. Will call him Wednesday PM.

## 2024-05-25 ENCOUNTER — Ambulatory Visit: Payer: Medicare HMO | Admitting: Urology

## 2024-05-26 DIAGNOSIS — N2581 Secondary hyperparathyroidism of renal origin: Secondary | ICD-10-CM | POA: Diagnosis not present

## 2024-05-26 DIAGNOSIS — N186 End stage renal disease: Secondary | ICD-10-CM | POA: Diagnosis not present

## 2024-05-26 DIAGNOSIS — Z992 Dependence on renal dialysis: Secondary | ICD-10-CM | POA: Diagnosis not present

## 2024-05-26 NOTE — Telephone Encounter (Signed)
 The patient reports he still has his dialysis catheter and his kidney doctor told him it would be a week or until it is removed. Jacob Farmer opted to postpone LAAO to 7/3 for planning. Will call Jacob Farmer next week for an update on catheter. He was grateful for follow-up.

## 2024-05-27 ENCOUNTER — Other Ambulatory Visit: Payer: Medicare HMO

## 2024-05-27 ENCOUNTER — Other Ambulatory Visit: Payer: Self-pay | Admitting: Internal Medicine

## 2024-05-27 ENCOUNTER — Other Ambulatory Visit (HOSPITAL_COMMUNITY): Payer: Self-pay | Admitting: Nephrology

## 2024-05-27 DIAGNOSIS — C61 Malignant neoplasm of prostate: Secondary | ICD-10-CM

## 2024-05-27 DIAGNOSIS — N186 End stage renal disease: Secondary | ICD-10-CM

## 2024-05-27 DIAGNOSIS — I10 Essential (primary) hypertension: Secondary | ICD-10-CM

## 2024-05-28 DIAGNOSIS — N186 End stage renal disease: Secondary | ICD-10-CM | POA: Diagnosis not present

## 2024-05-28 DIAGNOSIS — Z992 Dependence on renal dialysis: Secondary | ICD-10-CM | POA: Diagnosis not present

## 2024-05-28 DIAGNOSIS — N2581 Secondary hyperparathyroidism of renal origin: Secondary | ICD-10-CM | POA: Diagnosis not present

## 2024-05-28 LAB — TESTOSTERONE: Testosterone: 9 ng/dL — ABNORMAL LOW (ref 264–916)

## 2024-05-28 LAB — PSA: Prostate Specific Ag, Serum: <0.1 ng/mL (ref 0.0–4.0)

## 2024-05-31 DIAGNOSIS — N2581 Secondary hyperparathyroidism of renal origin: Secondary | ICD-10-CM | POA: Diagnosis not present

## 2024-05-31 DIAGNOSIS — Z992 Dependence on renal dialysis: Secondary | ICD-10-CM | POA: Diagnosis not present

## 2024-05-31 DIAGNOSIS — N186 End stage renal disease: Secondary | ICD-10-CM | POA: Diagnosis not present

## 2024-05-31 NOTE — Progress Notes (Signed)
 History of Present Illness:   4.23.2024: referred by Dr Lydia Sams for PSA elevation.  PSA in Oct 2022--0.69 PSA 2.16.2024--7.49  He does have a h/o PCA--  He underwent ultrasound and biopsy of his prostate on 9.27.2016. Prostatic volume 17.6 cc. PSA 4.7. PSA density 0.27.  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   He underwent brachytherapy on 1.26.2017.   Last seen in 2020 for PCa  followup. PSA @ that time 0.20.  He has stage IV CKD.  Followed by Dr. Carrolyn Clan.  7.12.2024: PSMA PET scan-- 1. Radiotracer avid left external iliac, left periaortic, and left pelvic lymph nodes compatible with nodal disease involvement. 2. No evidence of local recurrence in the prostate bed. 3. No evidence of radiotracer avid metastatic disease in the chest. 4. Trace bilateral pleural effusions. 5. 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 Recommend follow-up ultrasound every 12 months and vascular consultation. 6.  Aortic Atherosclerosis (ICD10-I70.0).  9.10.2024: Started on LT ADT w/ leuprolide  30 mg.  11.5.2024: PSA down to 1.7 from 26 which was last checked in April of this year.  Testosterone  level 7.  Occasional hot flashes.  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   1.28.2025: PSA 0.1, T level <3.  No real voiding issues except for nocturia x 3-4.  No blood in his urine.  He does not get regular hot flashes.  He is complaining of a sore area on his right buttock that drained this morning.  6.17.2025:   Past Medical History:  Diagnosis Date   Acute CVA (cerebrovascular accident) (HCC) 09/11/2023   CAD (coronary artery disease)    Acute myocardial infarction treated with TPA in 1990; 1999-stents to circumflex and RCA; residual total occlusion of the left anterior descending; normal ejection fraction; stress nuclear in 2001-distal anteroseptal ischemia; normal LV function   CKD (chronic kidney  disease) stage 4, GFR 15-29 ml/min (HCC)    First degree heart block    History of gastric ulcer    History of kidney stones    History of MI (myocardial infarction)    1990-  treated w/ TPA   Hyperlipidemia    Hypertension    Jaundice    OA (osteoarthritis)    Prediabetes    Prostate cancer (HCC)    Stage T1c , Gleason 3+4,  PSA 4.7,  vol 24cc--  scheduled for radiative seed implants   Psoriatic arthritis (HCC)    RA (rheumatoid arthritis) (HCC)    Dr. Myla Artist   Sinus bradycardia    Wears dentures     Past Surgical History:  Procedure Laterality Date   A/V FISTULAGRAM Left 04/20/2024   Procedure: A/V Fistulagram;  Surgeon: Margherita Shell, MD;  Location: [facility];  Service: Cardiovascular;  Laterality: Left;   AV FISTULA PLACEMENT Left 01/27/2024   Procedure: CREATION OF LEFT ARM ARTERIOVENOUS (AV) FISTULA;  Surgeon: Adine Hoof, MD;  Location: [facility] OR;  Service: Vascular;  Laterality: Left;   BIOPSY  08/27/2019   Procedure: BIOPSY;  Surgeon: Ruby Corporal, MD;  [facility];  Service: Endoscopy;;  gastric   BIOPSY  04/03/2022   Procedure: BIOPSY;  Surgeon: Urban Garden, MD;  [facility];  Service: Gastroenterology;;   BIOPSY  04/21/2023   Procedure: BIOPSY;  Surgeon: Vinetta Greening, DO;  [facility];  Service: Endoscopy;;   callus removal Left 09/13/2022  left foot   CARDIOVASCULAR STRESS TEST  2001  per Dr Macdonald [place] clinic note   distal anteroseptal ischemia,  normal LVF   COLONOSCOPY  last one 2006 (approx)   COLONOSCOPY WITH PROPOFOL  N/A 04/03/2022   Procedure: COLONOSCOPY WITH PROPOFOL ;  Surgeon: Urban Garden, MD;  [facility]: [facility];  Service: Gastroenterology;  Laterality: N/A;  945   CORONARY ANGIOPLASTY WITH STENT PLACEMENT  1999   DES x2  to CFX and RCA/  residual total occlusion LAD,  normal LVEF   ENTEROSCOPY N/A 04/24/2023   Procedure: ENTEROSCOPY;  Surgeon: Urban Garden, MD;  [facility];  Service: Gastroenterology;  Laterality: N/A;   ESOPHAGOGASTRODUODENOSCOPY (EGD) WITH PROPOFOL  N/A 08/27/2019   Procedure: ESOPHAGOGASTRODUODENOSCOPY (EGD) WITH PROPOFOL ;  Surgeon: Ruby Corporal, MD;  [facility];  Service: Endoscopy;  Laterality: N/A;  10:10   ESOPHAGOGASTRODUODENOSCOPY (EGD) WITH PROPOFOL  N/A 04/03/2022   Procedure: ESOPHAGOGASTRODUODENOSCOPY (EGD) WITH PROPOFOL ;  Surgeon: Urban Garden, MD;  [facility];  Service: Gastroenterology;  Laterality: N/A;   ESOPHAGOGASTRODUODENOSCOPY (EGD) WITH PROPOFOL  N/A 02/16/2023   Procedure: ESOPHAGOGASTRODUODENOSCOPY (EGD) WITH PROPOFOL ;  Surgeon: Urban Garden, MD;  [facility];  Service: Gastroenterology;  Laterality: N/A;   ESOPHAGOGASTRODUODENOSCOPY (EGD) WITH PROPOFOL  N/A 04/21/2023   Procedure: ESOPHAGOGASTRODUODENOSCOPY (EGD) WITH PROPOFOL ;  Surgeon: Vinetta Greening, DO;  [facility];  Service: Endoscopy;  Laterality: N/A;   EYE SURGERY Bilateral    cataract   GIVENS CAPSULE STUDY N/A 04/22/2023   Procedure: GIVENS CAPSULE STUDY;  Surgeon: Urban Garden, MD;  [facility];  Service: Gastroenterology;  Laterality: N/A;   HEMOSTASIS CLIP PLACEMENT  04/24/2023   Procedure: HEMOSTASIS CLIP PLACEMENT;  Surgeon: Urban Garden, MD;  [facility];  Service: Gastroenterology;;   HOT HEMOSTASIS  04/03/2022   Procedure: HOT HEMOSTASIS (ARGON PLASMA COAGULATION/BICAP);  Surgeon: Umberto Ganong, Bearl Limes, MD;  [facility];  Service: Gastroenterology;;   HOT HEMOSTASIS  04/24/2023   Procedure: HOT HEMOSTASIS (ARGON PLASMA COAGULATION/BICAP);  Surgeon: Umberto Ganong, Bearl Limes, MD;  [facility];  Service: Gastroenterology;;   IR FLUORO GUIDE CV LINE RIGHT  05/30/2023   IR US  GUIDE VASC ACCESS RIGHT  05/30/2023   LIGATION OF ARTERIOVENOUS  FISTULA Left 04/20/2024    Procedure: BRANCH LIGATION OF LEFT ARM ARTERIOVENOUS FISTULA;  Surgeon: Philipp Brawn, MD;  Location: [facility] OR;  Service: Vascular;  Laterality: Left;  BRANCH LIGATION LEFT FISTULA  ARM   POLYPECTOMY  04/03/2022   Procedure: POLYPECTOMY;  Surgeon: Urban Garden, MD;  [facility];  Service: Gastroenterology;;   POSTERIOR CERVICAL FUSION/FORAMINOTOMY N/A 05/26/2017   Procedure: RIGHT C4-5 FORAMINOTOMY WITH EXCISION OF HERNIATED DISC;  Surgeon: Alphonso Jean, MD;  Location: [facility] OR;  Service: Orthopedics;  Laterality: N/A;   RADIOACTIVE SEED IMPLANT N/A 01/11/2016   Procedure: RADIOACTIVE SEED IMPLANT/BRACHYTHERAPY IMPLANT;  Surgeon: Trent Frizzle, MD;  Location: [facility];  Service: Urology;  Laterality: N/A;   73  seeds implanted no seeds founds in bladder   SCLEROTHERAPY  04/24/2023   Procedure: SCLEROTHERAPY;  Surgeon: Umberto Ganong, Bearl Limes, MD;  [facility];  Service: Gastroenterology;;   SUBMUCOSAL TATTOO INJECTION  04/24/2023   Procedure: SUBMUCOSAL TATTOO INJECTION;  Surgeon: Urban Garden, MD;  [facility];  Service: Gastroenterology;;    Home Medications:  Allergies as of 06/01/2024   No Known Allergies  Medication List        Accurate as of May 31, 2024  6:12 AM. If you have any questions, ask your nurse or doctor.          albuterol  108 (90 Base) MCG/ACT inhaler Commonly known as: VENTOLIN  HFA Inhale 2 puffs into the lungs every 4 (four) hours as needed.   allopurinol  100 MG tablet Commonly known as: ZYLOPRIM  TAKE 1/2 TABLET EVERY OTHER DAY   amLODipine  5 MG tablet Commonly known as: NORVASC  TAKE 1 TABLET EVERY DAY   apixaban  2.5 MG Tabs tablet Commonly known as: ELIQUIS  Take 1 tablet (2.5 mg total) by mouth 2 (two) times daily.   aspirin  EC 81 MG tablet Take 81 mg by mouth in the morning. Swallow whole.   calcitRIOL  0.25 MCG capsule Commonly known as: ROCALTROL  Take 0.25  mcg by mouth every Monday, Wednesday, and Friday. At dialysis   Dupixent  300 MG/2ML So[place] Generic drug: Dupilumab  Inject 300 mg into the skin every 14 (fourteen) days. Starting at day 15 for maintenance.   ELIGARD  [place] Inject 1 Dose into the skin every 4 (four) months.   folic acid  400 MCG tablet Commonly known as: FOLVITE  Take 400 mcg by mouth in the morning.   leflunomide  10 MG tablet Commonly known as: ARAVA  TAKE 1 TABLET EVERY DAY   pantoprazole  40 MG tablet Commonly known as: PROTONIX  TAKE 1 TABLET TWICE DAILY   promethazine -dextromethorphan  6.25-15 MG/5ML syrup Commonly known as: PROMETHAZINE -DM Take 5 mLs by mouth 4 (four) times daily as needed for cough.   Renvela  800 MG tablet Generic drug: sevelamer  carbonate Take 800 mg by mouth 2 (two) times daily with a meal.   rosuvastatin  40 MG tablet Commonly known as: CRESTOR  Take 1 tablet (40 mg total) by mouth daily. What changed: when to take this   triamcinolone  cream 0.1 % Commonly known as: KENALOG  Apply 1 Application topically daily as needed (irritation). What changed:  when to take this reasons to take this        Allergies: No Known Allergies  Family History  Problem Relation Age of Onset   Heart attack Mother    Coronary artery disease Father    Ulcerative colitis Father    Ulcers Father    Breast cancer Sister    ALS Sister    COPD Brother    Pancreatic cancer Brother    Diabetes Brother    Cirrhosis Son    Seizures Son     Social History:  reports that he quit smoking about 35 years ago. His smoking use included cigarettes. He started smoking about 65 years ago. He has a 30 pack-year smoking history. He has never been exposed to tobacco smoke. He has never used smokeless tobacco. He reports current alcohol  use of about 3.0 standard drinks of alcohol  per week. He reports that he does not use drugs.  ROS: A complete review of systems was performed.  All systems are negative except for pertinent  findings as noted.  Physical Exam:  Vital signs in last 24 hours: There were no vitals taken for this visit. Constitutional:  Alert and oriented, No acute distress Cardiovascular: Regular rate  Respiratory: Normal respiratory effort GU: Indurated 2 cm area on his right buttock with small draining sinus, most likely abscess.  Minimal fluctuance. 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  Now on LT ADT which he tolerates well.  Excellent PSA response thus far.    Right buttock abscess  Plan:  22-month leuprolide  administered today  I gave him a weeks worth of doxycycline   I would recommend follow-up of aneurysmal dilatation of abdominal aorta with the patient's vascular surgeon

## 2024-06-01 ENCOUNTER — Ambulatory Visit (HOSPITAL_COMMUNITY)
Admission: RE | Admit: 2024-06-01 | Discharge: 2024-06-01 | Disposition: A | Discharge status: Home / Self Care | Source: Ambulatory Visit | Attending: Nephrology | Admitting: Nephrology

## 2024-06-01 ENCOUNTER — Ambulatory Visit: Payer: Medicare HMO | Admitting: Urology

## 2024-06-01 VITALS — BP 115/61 | HR 84

## 2024-06-01 DIAGNOSIS — N186 End stage renal disease: Secondary | ICD-10-CM | POA: Insufficient documentation

## 2024-06-01 DIAGNOSIS — Z4901 Encounter for fitting and adjustment of extracorporeal dialysis catheter: Secondary | ICD-10-CM | POA: Insufficient documentation

## 2024-06-01 DIAGNOSIS — R9721 Rising PSA following treatment for malignant neoplasm of prostate: Secondary | ICD-10-CM

## 2024-06-01 DIAGNOSIS — Z79899 Other long term (current) drug therapy: Secondary | ICD-10-CM | POA: Diagnosis not present

## 2024-06-01 DIAGNOSIS — C61 Malignant neoplasm of prostate: Secondary | ICD-10-CM

## 2024-06-01 HISTORY — PX: IR REMOVAL TUN CV CATH W/O FL: IMG2289

## 2024-06-01 MED ORDER — LIDOCAINE HCL 1 % IJ SOLN
INTRAMUSCULAR | Status: AC
  Filled 2024-06-01: qty 20

## 2024-06-01 MED ORDER — LIDOCAINE HCL 1 % IJ SOLN
10.0000 mL | Freq: Once | INTRAMUSCULAR | Status: AC
  Administered 2024-06-01: 10 mL via INTRADERMAL

## 2024-06-02 DIAGNOSIS — N186 End stage renal disease: Secondary | ICD-10-CM | POA: Diagnosis not present

## 2024-06-02 DIAGNOSIS — N2581 Secondary hyperparathyroidism of renal origin: Secondary | ICD-10-CM | POA: Diagnosis not present

## 2024-06-02 DIAGNOSIS — Z992 Dependence on renal dialysis: Secondary | ICD-10-CM | POA: Diagnosis not present

## 2024-06-03 NOTE — Telephone Encounter (Signed)
 Confirmed with the patient's wife that the dialysis catheter was removed 06/01/2024 so he may proceed with LAAO on 06/17/2024. Will mail updated instructions and call next week to review together.

## 2024-06-04 DIAGNOSIS — N2581 Secondary hyperparathyroidism of renal origin: Secondary | ICD-10-CM | POA: Diagnosis not present

## 2024-06-04 DIAGNOSIS — Z992 Dependence on renal dialysis: Secondary | ICD-10-CM | POA: Diagnosis not present

## 2024-06-04 DIAGNOSIS — N186 End stage renal disease: Secondary | ICD-10-CM | POA: Diagnosis not present

## 2024-06-04 NOTE — Addendum Note (Signed)
 Addended by: Sharnell Knight A on: 06/04/2024 10:02 AM   Modules accepted: Orders

## 2024-06-04 NOTE — Telephone Encounter (Addendum)
 Spoke with Administrator, Civil Service at [facility]. Requested labs (BMET, CBC) to be drawn during a dialysis session - order for BMET and CBC sent to fax: [phone]  Please draw during 6/25 session if possible.

## 2024-06-07 DIAGNOSIS — Z992 Dependence on renal dialysis: Secondary | ICD-10-CM | POA: Diagnosis not present

## 2024-06-07 DIAGNOSIS — N186 End stage renal disease: Secondary | ICD-10-CM | POA: Diagnosis not present

## 2024-06-07 DIAGNOSIS — N2581 Secondary hyperparathyroidism of renal origin: Secondary | ICD-10-CM | POA: Diagnosis not present

## 2024-06-07 NOTE — Telephone Encounter (Signed)
 Received fax back from, [facility] stating labs would be drawn 06/16/2024. Called and spoke with RN on duty and reiterated these labs are to be drawn for procedure on 06/17/2024 and labs will not be resulted in time. Requested the labs are drawn either 6/25 or 6/27 and faxed to [phone].  Orders sent again.

## 2024-06-07 NOTE — Telephone Encounter (Signed)
 Leita with [facility] calling back to speak with you.

## 2024-06-07 NOTE — Telephone Encounter (Signed)
 Leita stated that due to billing issues, [facility] would not be able to draw labs. Will have the patient go to [facility] next week.

## 2024-06-09 DIAGNOSIS — N2581 Secondary hyperparathyroidism of renal origin: Secondary | ICD-10-CM | POA: Diagnosis not present

## 2024-06-09 DIAGNOSIS — Z992 Dependence on renal dialysis: Secondary | ICD-10-CM | POA: Diagnosis not present

## 2024-06-09 DIAGNOSIS — N186 End stage renal disease: Secondary | ICD-10-CM | POA: Diagnosis not present

## 2024-06-11 ENCOUNTER — Telehealth: Payer: Self-pay

## 2024-06-11 DIAGNOSIS — N186 End stage renal disease: Secondary | ICD-10-CM | POA: Diagnosis not present

## 2024-06-11 DIAGNOSIS — N2581 Secondary hyperparathyroidism of renal origin: Secondary | ICD-10-CM | POA: Diagnosis not present

## 2024-06-11 DIAGNOSIS — Z992 Dependence on renal dialysis: Secondary | ICD-10-CM | POA: Diagnosis not present

## 2024-06-11 NOTE — Telephone Encounter (Signed)
 Pre-Watchman call attempted 2 times today.  Will try again Monday.

## 2024-06-14 DIAGNOSIS — Z992 Dependence on renal dialysis: Secondary | ICD-10-CM | POA: Diagnosis not present

## 2024-06-14 DIAGNOSIS — N2581 Secondary hyperparathyroidism of renal origin: Secondary | ICD-10-CM | POA: Diagnosis not present

## 2024-06-14 DIAGNOSIS — N186 End stage renal disease: Secondary | ICD-10-CM | POA: Diagnosis not present

## 2024-06-14 NOTE — Telephone Encounter (Signed)
 Confirmed procedure date of July 3rd. Confirmed arrival time of 5:30 AM for procedure time at 7:30 AM. Reviewed pre-procedure instructions with patient. The patient understands to call if questions/concerns arise prior to procedure.

## 2024-06-15 ENCOUNTER — Ambulatory Visit: Admitting: Internal Medicine

## 2024-06-15 ENCOUNTER — Encounter: Payer: Self-pay | Admitting: Internal Medicine

## 2024-06-15 VITALS — BP 128/68 | HR 67 | Ht 70.0 in | Wt 152.0 lb

## 2024-06-15 DIAGNOSIS — I480 Paroxysmal atrial fibrillation: Secondary | ICD-10-CM | POA: Diagnosis not present

## 2024-06-15 DIAGNOSIS — E1122 Type 2 diabetes mellitus with diabetic chronic kidney disease: Secondary | ICD-10-CM | POA: Diagnosis not present

## 2024-06-15 DIAGNOSIS — M0600 Rheumatoid arthritis without rheumatoid factor, unspecified site: Secondary | ICD-10-CM | POA: Diagnosis not present

## 2024-06-15 DIAGNOSIS — L97512 Non-pressure chronic ulcer of other part of right foot with fat layer exposed: Secondary | ICD-10-CM | POA: Diagnosis not present

## 2024-06-15 DIAGNOSIS — Z0001 Encounter for general adult medical examination with abnormal findings: Secondary | ICD-10-CM | POA: Diagnosis not present

## 2024-06-15 DIAGNOSIS — I10 Essential (primary) hypertension: Secondary | ICD-10-CM | POA: Diagnosis not present

## 2024-06-15 DIAGNOSIS — I2510 Atherosclerotic heart disease of native coronary artery without angina pectoris: Secondary | ICD-10-CM | POA: Diagnosis not present

## 2024-06-15 DIAGNOSIS — C61 Malignant neoplasm of prostate: Secondary | ICD-10-CM | POA: Diagnosis not present

## 2024-06-15 DIAGNOSIS — N184 Chronic kidney disease, stage 4 (severe): Secondary | ICD-10-CM

## 2024-06-15 DIAGNOSIS — N186 End stage renal disease: Secondary | ICD-10-CM | POA: Diagnosis not present

## 2024-06-15 DIAGNOSIS — Z992 Dependence on renal dialysis: Secondary | ICD-10-CM | POA: Diagnosis not present

## 2024-06-15 MED ORDER — MUPIROCIN 2 % EX OINT: 1.0000 | TOPICAL_OINTMENT | Freq: Two times a day (BID) | CUTANEOUS | 0 refills | Status: DC

## 2024-06-15 NOTE — Assessment & Plan Note (Signed)
On Arava Followed by Rheumatology 

## 2024-06-15 NOTE — Progress Notes (Signed)
 Established Patient Office Visit  Subjective:  Patient ID: Jacob Farmer, male    DOB: 1946-03-17  Age: 78 y.o. MRN: [id number]  CC:  Chief Complaint  Patient presents with   Annual Exam    CPE, 5 month f/u. Needs blood work done for procedure coming up.     HPI Jacob Farmer is a 78 y.o. male with past medical history of CAD status post stent placement, HTN, HLD, RA, ESRD on HD, prostate ca. s/p radiotherapy, gout, cervical spinal stenosis and GERD who presents for annual physical.  CAD s/p stent placement and HTN: BP is well-controlled today.  He has had low BP on HD days. He reports dizziness due to hypotension.  Takes amlodipine  5 mg non-HD days. Patient denies headache, dizziness, chest pain, dyspnea or palpitations.  He follows up with cardiology.  He is on aspirin  and statin.  He was diagnosed with A Fib, but had recurrent GI bleeding with Eliquis . He is planned to get Watchman device. Denies any signs of bleeding currently.   RA: He is on Arava  for it. He goes to rheumatology clinic.  He also takes allopurinol  for history of gout, denies any recent flareups.   ESRD on HD: He gets HD on MWF now. He follows up with nephrology for it.  Denies any dysuria or hematuria currently.   He has a history of allergic dermatitis, for which he takes Dupixent  and sees Dr. Livingston.   Prostate cancer: He had urology evaluation and PET scan for rising PSA.  He is getting leuprolide  injections.  He currently denies any dysuria or hematuria.  He reports having an ulcer on the right foot for the last 1 month.  He has been applying Neosporin over the area.  Denies any fever or chills currently.  Denies any puslike discharge, but has noticed oozing of blood on almost daily basis.  Past Medical History:  Diagnosis Date   Acute CVA (cerebrovascular accident) (HCC) 09/11/2023   CAD (coronary artery disease)    Acute myocardial infarction treated with TPA in 1990; 1999-stents to circumflex and RCA;  residual total occlusion of the left anterior descending; normal ejection fraction; stress nuclear in 2001-distal anteroseptal ischemia; normal LV function   CKD (chronic kidney disease) stage 4, GFR 15-29 ml/min (HCC)    First degree heart block    History of gastric ulcer    History of kidney stones    History of MI (myocardial infarction)    1990-  treated w/ TPA   Hyperlipidemia    Hypertension    Jaundice    OA (osteoarthritis)    Prediabetes    Prostate cancer (HCC)    Stage T1c , Gleason 3+4,  PSA 4.7,  vol 24cc--  scheduled for radiative seed implants   Psoriatic arthritis (HCC)    RA (rheumatoid arthritis) (HCC)    Dr. Ronette   Sinus bradycardia    Wears dentures     Past Surgical History:  Procedure Laterality Date   A/V FISTULAGRAM Left 04/20/2024   Procedure: A/V Fistulagram;  Surgeon: Serene Gaile ORN, MD;  Location: [facility];  Service: Cardiovascular;  Laterality: Left;   AV FISTULA PLACEMENT Left 01/27/2024   Procedure: CREATION OF LEFT ARM ARTERIOVENOUS (AV) FISTULA;  Surgeon: Sheree Penne Bruckner, MD;  Location: [facility] OR;  Service: Vascular;  Laterality: Left;   BIOPSY  08/27/2019   Procedure: BIOPSY;  Surgeon: Golda Claudis PENNER, MD;  [facility]: [facility];  Service: Endoscopy;;  gastric  BIOPSY  04/03/2022   Procedure: BIOPSY;  Surgeon: Eartha Angelia Sieving, MD;  [facility]: [facility];  Service: Gastroenterology;;   BIOPSY  04/21/2023   Procedure: BIOPSY;  Surgeon: Cindie Carlin POUR, DO;  [facility];  Service: Endoscopy;;   callus removal Left 09/13/2022   left foot   CARDIOVASCULAR STRESS TEST  2001  per Dr Juventino clinic note   distal anteroseptal ischemia,  normal LVF   COLONOSCOPY  last one 2006 (approx)   COLONOSCOPY WITH PROPOFOL  N/A 04/03/2022   Procedure: COLONOSCOPY WITH PROPOFOL ;  Surgeon: Eartha Angelia Sieving, MD;  [facility];  Service: Gastroenterology;  Laterality: N/A;  945   CORONARY  ANGIOPLASTY WITH STENT PLACEMENT  1999   DES x2  to CFX and RCA/  residual total occlusion LAD,  normal LVEF   ENTEROSCOPY N/A 04/24/2023   Procedure: ENTEROSCOPY;  Surgeon: Eartha Angelia Sieving, MD;  [facility];  Service: Gastroenterology;  Laterality: N/A;   ESOPHAGOGASTRODUODENOSCOPY (EGD) WITH PROPOFOL  N/A 08/27/2019   Procedure: ESOPHAGOGASTRODUODENOSCOPY (EGD) WITH PROPOFOL ;  Surgeon: Golda Claudis PENNER, MD;  [facility];  Service: Endoscopy;  Laterality: N/A;  10:10   ESOPHAGOGASTRODUODENOSCOPY (EGD) WITH PROPOFOL  N/A 04/03/2022   Procedure: ESOPHAGOGASTRODUODENOSCOPY (EGD) WITH PROPOFOL ;  Surgeon: Eartha Angelia Sieving, MD;  [facility];  Service: Gastroenterology;  Laterality: N/A;   ESOPHAGOGASTRODUODENOSCOPY (EGD) WITH PROPOFOL  N/A 02/16/2023   Procedure: ESOPHAGOGASTRODUODENOSCOPY (EGD) WITH PROPOFOL ;  Surgeon: Eartha Angelia Sieving, MD;  [facility];  Service: Gastroenterology;  Laterality: N/A;   ESOPHAGOGASTRODUODENOSCOPY (EGD) WITH PROPOFOL  N/A 04/21/2023   Procedure: ESOPHAGOGASTRODUODENOSCOPY (EGD) WITH PROPOFOL ;  Surgeon: Cindie Carlin POUR, DO;  [facility];  Service: Endoscopy;  Laterality: N/A;   EYE SURGERY Bilateral    cataract   GIVENS CAPSULE STUDY N/A 04/22/2023   Procedure: GIVENS CAPSULE STUDY;  Surgeon: Eartha Angelia Sieving, MD;  [facility];  Service: Gastroenterology;  Laterality: N/A;   HEMOSTASIS CLIP PLACEMENT  04/24/2023   Procedure: HEMOSTASIS CLIP PLACEMENT;  Surgeon: Eartha Angelia Sieving, MD;  [facility];  Service: Gastroenterology;;   HOT HEMOSTASIS  04/03/2022   Procedure: HOT HEMOSTASIS (ARGON PLASMA COAGULATION/BICAP);  Surgeon: Eartha Angelia, Sieving, MD;  [facility];  Service: Gastroenterology;;   HOT HEMOSTASIS  04/24/2023   Procedure: HOT HEMOSTASIS (ARGON PLASMA COAGULATION/BICAP);  Surgeon: Eartha Angelia, Sieving, MD;  [facility];  Service: Gastroenterology;;   IR FLUORO GUIDE CV LINE RIGHT  05/30/2023   IR REMOVAL TUN CV CATH W/O FL  06/01/2024   IR US  GUIDE VASC ACCESS RIGHT  05/30/2023   LIGATION OF ARTERIOVENOUS  FISTULA Left 04/20/2024   Procedure: BRANCH LIGATION OF LEFT ARM ARTERIOVENOUS FISTULA;  Surgeon: Pearline Norman RAMAN, MD;  Location: [facility];  Service: Vascular;  Laterality: Left;  BRANCH LIGATION LEFT FISTULA  ARM   POLYPECTOMY  04/03/2022   Procedure: POLYPECTOMY;  Surgeon: Eartha Angelia Sieving, MD;  [facility];  Service: Gastroenterology;;   POSTERIOR CERVICAL FUSION/FORAMINOTOMY N/A 05/26/2017   Procedure: RIGHT C4-5 FORAMINOTOMY WITH EXCISION OF HERNIATED DISC;  Surgeon: Lucilla Lynwood BRAVO, MD;  Location: [facility];  Service: Orthopedics;  Laterality: N/A;   RADIOACTIVE SEED IMPLANT N/A 01/11/2016   Procedure: RADIOACTIVE SEED IMPLANT/BRACHYTHERAPY IMPLANT;  Surgeon: Garnette Shack, MD;  Location: [facility];  Service: Urology;  Laterality: N/A;   73  seeds implanted no seeds founds in bladder   SCLEROTHERAPY  04/24/2023  Procedure: SCLEROTHERAPY;  Surgeon: Eartha Flavors, Toribio, MD;  [facility];  Service: Gastroenterology;;   SUBMUCOSAL TATTOO INJECTION  04/24/2023   Procedure: SUBMUCOSAL TATTOO INJECTION;  Surgeon: Eartha Flavors, Toribio, MD;  [facility];  Service: Gastroenterology;;    Family History  Problem Relation Age of Onset   Heart attack Mother    Coronary artery disease Father    Ulcerative colitis Father    Ulcers Father    Breast cancer Sister    ALS Sister    COPD Brother    Pancreatic cancer Brother    Diabetes Brother    Cirrhosis Son    Seizures Son     Social History   Socioeconomic History   Marital status: Married    Spouse name: Not on file   Number of children: 2   Years of education: Not on file   Highest education level: Not on file  Occupational History   Occupation: Retired Personnel officer  Tobacco Use    Smoking status: Former    Current packs/day: 0.00    Average packs/day: 1 pack/day for 30.0 years (30.0 ttl pk-yrs)    Types: Cigarettes    Start date: 05/22/1959    Quit date: 05/21/1989    Years since quitting: 35.0    Passive exposure: Never   Smokeless tobacco: Never  Vaping Use   Vaping status: Never Used  Substance and Sexual Activity   Alcohol  use: Yes    Alcohol /week: 3.0 standard drinks of alcohol     Types: 3 Standard drinks or equivalent per week    Comment: 1 glass of liquor, 3 days a week   Drug use: No   Sexual activity: Not on file  Other Topics Concern   Not on file  Social History Narrative   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      Right handed   Wears readers   Drinks 1 cup in mornings   Drinks ginger ale   Social Drivers of Health   Financial Resource Strain: Low Risk  (11/05/2023)   Overall Financial Resource Strain (CARDIA)    Difficulty of Paying Living Expenses: Not very hard  Food Insecurity: No Food Insecurity (02/18/2024)   Hunger Vital Sign    Worried About Running Out of Food in the Last Year: Never true    Ran Out of Food in the Last Year: Never true  Transportation Needs: No Transportation Needs (02/18/2024)   PRAPARE - Administrator, Civil Service (Medical): No    Lack of Transportation (Non-Medical): No  Physical Activity: Sufficiently Active (11/05/2023)   Exercise Vital Sign    Days of Exercise per Week: 5 days    Minutes of Exercise per Session: 30 min  Stress: No Stress Concern Present (11/05/2023)   [employer] of Occupational Health - Occupational Stress Questionnaire    Feeling of Stress : Not at all  Social Connections: Moderately Integrated (02/14/2024)   Social Connection and Isolation Panel    Frequency of Communication with Friends and Family: More than three times a week    Frequency of Social Gatherings with Friends and Family: Three times a week    Attends Religious Services: More than 4 times per year     Active Member of Clubs or Organizations: No    Attends Banker Meetings: Never    Marital Status: Married  Catering manager Violence: Not At Risk (02/18/2024)   Humiliation, Afraid, Rape, and Kick questionnaire    Fear of Current  or Ex-Partner: No    Emotionally Abused: No    Physically Abused: No    Sexually Abused: No    Outpatient Medications Prior to Visit  Medication Sig Dispense Refill   allopurinol  (ZYLOPRIM ) 100 MG tablet TAKE 1/2 TABLET EVERY OTHER DAY 22 tablet 3   amLODipine  (NORVASC ) 5 MG tablet TAKE 1 TABLET EVERY DAY 90 tablet 3   apixaban  (ELIQUIS ) 2.5 MG TABS tablet Take 1 tablet (2.5 mg total) by mouth 2 (two) times daily. 60 tablet 11   aspirin  EC 81 MG tablet Take 81 mg by mouth in the morning. Swallow whole.     calcitRIOL  (ROCALTROL ) 0.25 MCG capsule Take 0.25 mcg by mouth every Monday, Wednesday, and Friday. At dialysis     Dupilumab  (DUPIXENT ) 300 MG/2ML SOAJ Inject 300 mg into the skin every 14 (fourteen) days. Starting at day 15 for maintenance. 12 mL 3   folic acid  (FOLVITE ) 400 MCG tablet Take 400 mcg by mouth in the morning.     leflunomide  (ARAVA ) 10 MG tablet TAKE 1 TABLET EVERY DAY 90 tablet 0   Leuprolide  Acetate (ELIGARD  [place]) Inject 1 Dose into the skin every 4 (four) months.     pantoprazole  (PROTONIX ) 40 MG tablet TAKE 1 TABLET TWICE DAILY 180 tablet 3   RENVELA  800 MG tablet Take 800 mg by mouth 2 (two) times daily with a meal.     rosuvastatin  (CRESTOR ) 40 MG tablet Take 1 tablet (40 mg total) by mouth daily. (Patient taking differently: Take 40 mg by mouth every evening.) 90 tablet 3   triamcinolone  cream (KENALOG ) 0.1 % Apply 1 Application topically daily as needed (irritation). (Patient taking differently: Apply 1 Application topically as needed.)     No facility-administered medications prior to visit.    No Known Allergies  ROS Review of Systems  Constitutional:  Negative for chills and fever.  HENT:  Negative for congestion  and sore throat.   Eyes:  Negative for pain and discharge.  Respiratory:  Negative for cough and shortness of breath.   Cardiovascular:  Negative for chest pain and palpitations.  Gastrointestinal:  Positive for constipation. Negative for diarrhea, nausea and vomiting.  Endocrine: Negative for polydipsia and polyuria.  Genitourinary:  Negative for dysuria and hematuria.  Musculoskeletal:  Positive for arthralgias and back pain. Negative for neck pain and neck stiffness.  Skin:  Positive for rash.       Ulcer on right foot  Neurological:  Negative for dizziness, weakness, numbness and headaches.  Psychiatric/Behavioral:  Negative for agitation and behavioral problems.       Objective:    Physical Exam Vitals reviewed.  Constitutional:      General: He is not in acute distress.    Appearance: He is not diaphoretic.  HENT:     Head: Normocephalic and atraumatic.     Nose: Nose normal.     Mouth/Throat:     Mouth: Mucous membranes are moist.   Eyes:     General: No scleral icterus.    Extraocular Movements: Extraocular movements intact.    Cardiovascular:     Rate and Rhythm: Normal rate and regular rhythm.     Heart sounds: Normal heart sounds. No murmur heard. Pulmonary:     Breath sounds: Normal breath sounds. No wheezing or rales.  Abdominal:     Palpations: Abdomen is soft.     Tenderness: There is no abdominal tenderness.   Musculoskeletal:     Cervical back: Neck supple. No tenderness.  Right lower leg: No edema.     Left lower leg: No edema.  Feet:     Comments: Right heel ulcer, about 1.5 cm in diameter with mild oozing of blood from ulcer base  Skin:    General: Skin is warm.     Findings: Rash (Diffuse eczematous and dry, scaly patches over LE) present.     Comments: Has soft tissue mass over left side of neck, about 2 cm in diameter, nontender Has AV fistula in left UE   Neurological:     General: No focal deficit present.     Mental Status: He is  alert and oriented to person, place, and time.     Sensory: No sensory deficit.     Motor: Weakness (B/l LE - 4/5) present.   Psychiatric:        Mood and Affect: Mood normal.        Behavior: Behavior normal.     BP 128/68 (BP Location: Left Arm)   Pulse 67   Ht 5' 10 (1.778 m)   Wt 152 lb (68.9 kg)   SpO2 99%   BMI 21.81 kg/m  Wt Readings from Last 3 Encounters:  06/15/24 152 lb (68.9 kg)  05/20/24 150 lb (68 kg)  05/18/24 155 lb (70.3 kg)    Lab Results  Component Value Date   TSH 3.636 02/13/2023   Lab Results  Component Value Date   WBC 6.5 05/20/2024   HGB 10.4 (L) 05/20/2024   HCT 33.0 (L) 05/20/2024   MCV 93 05/20/2024   PLT 251 05/20/2024   Lab Results  Component Value Date   NA 137 05/20/2024   K 3.3 (L) 05/20/2024   CO2 27 05/20/2024   GLUCOSE 103 (H) 05/20/2024   BUN 21 05/20/2024   CREATININE 4.78 (H) 05/20/2024   BILITOT 0.2 05/11/2024   ALKPHOS 135 (H) 05/11/2024   AST 30 05/11/2024   ALT 14 05/11/2024   PROT 6.5 05/11/2024   ALBUMIN 3.6 (L) 05/11/2024   CALCIUM  8.8 05/20/2024   ANIONGAP 8 02/17/2024   EGFR 12 (L) 05/20/2024   Lab Results  Component Value Date   CHOL 123 09/12/2023   Lab Results  Component Value Date   HDL 64 09/12/2023   Lab Results  Component Value Date   LDLCALC 41 09/12/2023   Lab Results  Component Value Date   TRIG 92 09/12/2023   Lab Results  Component Value Date   CHOLHDL 1.9 09/12/2023   Lab Results  Component Value Date   HGBA1C 5.7 02/03/2024      Assessment & Plan:   Problem List Items Addressed This Visit       Cardiovascular and Mediastinum   Essential hypertension   BP Readings from Last 1 Encounters:  06/15/24 128/68   Well-controlled with Amlodipine  5 mg once daily, was on 10 mg dose till recently [facility] hydralazine  as he had hypotension on HD days Counseled for compliance with the medications Advised DASH diet and moderate exercise/walking as tolerated      Relevant Orders    TSH   Coronary artery disease involving native coronary artery of native heart without angina pectoris   S/p stent placement in 1999 On aspirin  and statin Followed by Cardiology      Relevant Orders   Lipid Profile   Paroxysmal atrial fibrillation (HCC)   In sinus rhythm currently, rate controlled On Eliquis , had to stop in the past due to recurrent GI bleeding Not on rate-controlling agent due  to bradycardia On Aspirin  now due to recent CVA Follow-up with cardiology for Watchman device - on Eliquis  currently      Relevant Orders   TSH     Endocrine   Type 2 diabetes mellitus with diabetic chronic kidney disease (HCC)   Lab Results  Component Value Date   HGBA1C 5.7 02/03/2024    Diet controlled Advised to follow diabetic diet On statin F/u CMP and lipid panel Diabetic eye exam: Advised to follow up with Ophthalmology for diabetic eye exam  Followed by Podiatry for toenail trimming      Relevant Orders   CMP14+EGFR     Musculoskeletal and Integument   Rheumatoid arthritis (HCC)   On Arava  Followed by Rheumatology        Genitourinary   ESRD on dialysis (HCC)   Gets HD on MWF through HD catheter Sees vascular surgery for AV fistula      Prostate cancer [facility])   S/p radiotherapy Had rising PSA Had urology evaluation-had PET scan, has radiotracer activity in iliac and pelvic lymph nodes Gets leuprolide  injections      Relevant Orders   CBC with Differential/Platelet     Other   Encounter for general adult medical examination with abnormal findings - Primary   Physical exam as documented. Counseling done  re healthy lifestyle involving commitment to 150 minutes exercise per week, heart healthy diet, and attaining healthy weight.The importance of adequate sleep also discussed. Immunization and cancer screening needs are specifically addressed at this visit.      Ulcer of right foot with fat layer exposed (HCC)   Advised to apply mupirocin  ointment  for bacterial PPx Zinc oxide for wound healing Advised to keep area covered with a gauze piece Advised to discuss with podiatry If non-healing after 3 weeks, may need Vascular studies and wound care referral      Relevant Medications   mupirocin  ointment (BACTROBAN ) 2 %    Meds ordered this encounter  Medications   mupirocin  ointment (BACTROBAN ) 2 %    Sig: Apply 1 Application topically 2 (two) times daily.    Dispense:  22 g    Refill:  0    Follow-up: Return in about 6 months (around 12/16/2024) for HTN and DM.    Suzzane MARLA Blanch, MD

## 2024-06-15 NOTE — Assessment & Plan Note (Signed)
 Physical exam as documented. Counseling done  re healthy lifestyle involving commitment to 150 minutes exercise per week, heart healthy diet, and attaining healthy weight.The importance of adequate sleep also discussed. Immunization and cancer screening needs are specifically addressed at this visit.

## 2024-06-15 NOTE — Assessment & Plan Note (Signed)
 Advised to apply mupirocin  ointment for bacterial PPx Zinc oxide for wound healing Advised to keep area covered with a gauze piece Advised to discuss with podiatry If non-healing after 3 weeks, may need Vascular studies and wound care referral

## 2024-06-15 NOTE — Patient Instructions (Addendum)
 Please apply Zinc oxide cream for local wound care. Please apply Mupirocin  over edges for infection prevention. Please keep area covered with gauze.  Please continue to take medications as prescribed.  Please continue to follow low salt diet and ambulate as tolerated.

## 2024-06-15 NOTE — Assessment & Plan Note (Signed)
S/p stent placement in 1999 On aspirin and statin Followed by Cardiology 

## 2024-06-15 NOTE — Assessment & Plan Note (Signed)
 BP Readings from Last 1 Encounters:  06/15/24 128/68   Well-controlled with Amlodipine  5 mg once daily, was on 10 mg dose till recently DCed hydralazine  as he had hypotension on HD days Counseled for compliance with the medications Advised DASH diet and moderate exercise/walking as tolerated

## 2024-06-15 NOTE — Assessment & Plan Note (Addendum)
 Gets HD on MWF through HD catheter Sees vascular surgery for AV fistula

## 2024-06-15 NOTE — Assessment & Plan Note (Signed)
 In sinus rhythm currently, rate controlled On Eliquis , had to stop in the past due to recurrent GI bleeding Not on rate-controlling agent due to bradycardia On Aspirin  now due to recent CVA Follow-up with cardiology for Watchman device - on Eliquis  currently

## 2024-06-15 NOTE — Assessment & Plan Note (Addendum)
 S/p radiotherapy Had rising PSA Had urology evaluation-had PET scan, has radiotracer activity in iliac and pelvic lymph nodes Gets leuprolide  injections

## 2024-06-15 NOTE — Assessment & Plan Note (Signed)
 Lab Results  Component Value Date   HGBA1C 5.7 02/03/2024    Diet controlled Advised to follow diabetic diet On statin F/u CMP and lipid panel Diabetic eye exam: Advised to follow up with Ophthalmology for diabetic eye exam  Followed by Podiatry for toenail trimming

## 2024-06-16 ENCOUNTER — Ambulatory Visit: Payer: Self-pay | Admitting: Internal Medicine

## 2024-06-16 DIAGNOSIS — N2581 Secondary hyperparathyroidism of renal origin: Secondary | ICD-10-CM | POA: Diagnosis not present

## 2024-06-16 DIAGNOSIS — Z992 Dependence on renal dialysis: Secondary | ICD-10-CM | POA: Diagnosis not present

## 2024-06-16 DIAGNOSIS — N186 End stage renal disease: Secondary | ICD-10-CM | POA: Diagnosis not present

## 2024-06-16 LAB — CBC WITH DIFFERENTIAL/PLATELET
Basophils Absolute: 0 10*3/uL (ref 0.0–0.2)
Basos: 1 %
EOS (ABSOLUTE): 0.4 10*3/uL (ref 0.0–0.4)
Eos: 7 %
Hematocrit: 32.5 % — ABNORMAL LOW (ref 37.5–51.0)
Hemoglobin: 10.1 g/dL — ABNORMAL LOW (ref 13.0–17.7)
Immature Grans (Abs): 0 10*3/uL (ref 0.0–0.1)
Immature Granulocytes: 0 %
Lymphocytes Absolute: 1.1 10*3/uL (ref 0.7–3.1)
Lymphs: 19 %
MCH: 28.3 pg (ref 26.6–33.0)
MCHC: 31.1 g/dL — ABNORMAL LOW (ref 31.5–35.7)
MCV: 91 fL (ref 79–97)
Monocytes Absolute: 0.7 10*3/uL (ref 0.1–0.9)
Monocytes: 12 %
Neutrophils Absolute: 3.4 10*3/uL (ref 1.4–7.0)
Neutrophils: 61 %
Platelets: 211 10*3/uL (ref 150–450)
RBC: 3.57 x10E6/uL — ABNORMAL LOW (ref 4.14–5.80)
RDW: 13.6 % (ref 11.6–15.4)
WBC: 5.5 10*3/uL (ref 3.4–10.8)

## 2024-06-16 LAB — CMP14+EGFR
ALT: 9 IU/L (ref 0–44)
AST: 14 IU/L (ref 0–40)
Albumin: 3.4 g/dL — ABNORMAL LOW (ref 3.8–4.8)
Alkaline Phosphatase: 132 IU/L — ABNORMAL HIGH (ref 44–121)
BUN/Creatinine Ratio: 5 — ABNORMAL LOW (ref 10–24)
BUN: 21 mg/dL (ref 8–27)
Bilirubin Total: 0.3 mg/dL (ref 0.0–1.2)
CO2: 27 mmol/L (ref 20–29)
Calcium: 8.9 mg/dL (ref 8.6–10.2)
Chloride: 94 mmol/L — ABNORMAL LOW (ref 96–106)
Creatinine, Ser: 4.2 mg/dL — ABNORMAL HIGH (ref 0.76–1.27)
Globulin, Total: 3.3 g/dL (ref 1.5–4.5)
Glucose: 120 mg/dL — ABNORMAL HIGH (ref 70–99)
Potassium: 3.3 mmol/L — ABNORMAL LOW (ref 3.5–5.2)
Sodium: 137 mmol/L (ref 134–144)
Total Protein: 6.7 g/dL (ref 6.0–8.5)
eGFR: 14 mL/min/{1.73_m2} — ABNORMAL LOW (ref >59)

## 2024-06-16 LAB — TSH: TSH: 3.32 u[IU]/mL (ref 0.450–4.500)

## 2024-06-16 LAB — LIPID PANEL
Chol/HDL Ratio: 2.3 ratio (ref 0.0–5.0)
Cholesterol, Total: 127 mg/dL (ref 100–199)
HDL: 56 mg/dL (ref >39)
LDL Chol Calc (NIH): 52 mg/dL (ref 0–99)
Triglycerides: 102 mg/dL (ref 0–149)
VLDL Cholesterol Cal: 19 mg/dL (ref 5–40)

## 2024-06-17 ENCOUNTER — Inpatient Hospital Stay (HOSPITAL_COMMUNITY)
Admission: RE | Disposition: A | Discharge status: Home / Self Care | Payer: Self-pay | Source: Home / Self Care | Attending: Cardiology

## 2024-06-17 ENCOUNTER — Encounter (HOSPITAL_COMMUNITY): Payer: Self-pay | Admitting: Cardiology

## 2024-06-17 ENCOUNTER — Other Ambulatory Visit: Payer: Self-pay

## 2024-06-17 ENCOUNTER — Inpatient Hospital Stay (HOSPITAL_COMMUNITY)

## 2024-06-17 ENCOUNTER — Encounter (HOSPITAL_COMMUNITY): Payer: Self-pay | Admitting: Anesthesiology

## 2024-06-17 ENCOUNTER — Ambulatory Visit: Admitting: Podiatry

## 2024-06-17 ENCOUNTER — Inpatient Hospital Stay (HOSPITAL_COMMUNITY)
Admission: RE | Admit: 2024-06-17 | Discharge: 2024-06-17 | DRG: 273 | Disposition: A | Discharge status: Home / Self Care | Attending: Cardiology | Admitting: Cardiology

## 2024-06-17 ENCOUNTER — Inpatient Hospital Stay (HOSPITAL_COMMUNITY): Payer: Self-pay | Admitting: Anesthesiology

## 2024-06-17 DIAGNOSIS — I5032 Chronic diastolic (congestive) heart failure: Secondary | ICD-10-CM | POA: Diagnosis present

## 2024-06-17 DIAGNOSIS — Z8673 Personal history of transient ischemic attack (TIA), and cerebral infarction without residual deficits: Secondary | ICD-10-CM

## 2024-06-17 DIAGNOSIS — D6869 Other thrombophilia: Secondary | ICD-10-CM | POA: Diagnosis present

## 2024-06-17 DIAGNOSIS — I252 Old myocardial infarction: Secondary | ICD-10-CM | POA: Diagnosis not present

## 2024-06-17 DIAGNOSIS — Z95818 Presence of other cardiac implants and grafts: Principal | ICD-10-CM | POA: Diagnosis present

## 2024-06-17 DIAGNOSIS — I440 Atrioventricular block, first degree: Secondary | ICD-10-CM | POA: Diagnosis present

## 2024-06-17 DIAGNOSIS — E1122 Type 2 diabetes mellitus with diabetic chronic kidney disease: Secondary | ICD-10-CM | POA: Diagnosis present

## 2024-06-17 DIAGNOSIS — Z87891 Personal history of nicotine dependence: Secondary | ICD-10-CM

## 2024-06-17 DIAGNOSIS — I480 Paroxysmal atrial fibrillation: Secondary | ICD-10-CM

## 2024-06-17 DIAGNOSIS — M069 Rheumatoid arthritis, unspecified: Secondary | ICD-10-CM | POA: Diagnosis present

## 2024-06-17 DIAGNOSIS — Z006 Encounter for examination for normal comparison and control in clinical research program: Secondary | ICD-10-CM

## 2024-06-17 DIAGNOSIS — Z7902 Long term (current) use of antithrombotics/antiplatelets: Secondary | ICD-10-CM | POA: Diagnosis not present

## 2024-06-17 DIAGNOSIS — C61 Malignant neoplasm of prostate: Secondary | ICD-10-CM | POA: Diagnosis present

## 2024-06-17 DIAGNOSIS — Z992 Dependence on renal dialysis: Secondary | ICD-10-CM | POA: Diagnosis not present

## 2024-06-17 DIAGNOSIS — Z7901 Long term (current) use of anticoagulants: Secondary | ICD-10-CM | POA: Diagnosis not present

## 2024-06-17 DIAGNOSIS — K7460 Unspecified cirrhosis of liver: Secondary | ICD-10-CM | POA: Diagnosis present

## 2024-06-17 DIAGNOSIS — I2510 Atherosclerotic heart disease of native coronary artery without angina pectoris: Secondary | ICD-10-CM | POA: Diagnosis present

## 2024-06-17 DIAGNOSIS — L4050 Arthropathic psoriasis, unspecified: Secondary | ICD-10-CM | POA: Diagnosis present

## 2024-06-17 DIAGNOSIS — E785 Hyperlipidemia, unspecified: Secondary | ICD-10-CM | POA: Diagnosis present

## 2024-06-17 DIAGNOSIS — I495 Sick sinus syndrome: Secondary | ICD-10-CM | POA: Diagnosis present

## 2024-06-17 DIAGNOSIS — Z8719 Personal history of other diseases of the digestive system: Secondary | ICD-10-CM

## 2024-06-17 DIAGNOSIS — N186 End stage renal disease: Secondary | ICD-10-CM | POA: Diagnosis present

## 2024-06-17 DIAGNOSIS — N184 Chronic kidney disease, stage 4 (severe): Secondary | ICD-10-CM

## 2024-06-17 DIAGNOSIS — I132 Hypertensive heart and chronic kidney disease with heart failure and with stage 5 chronic kidney disease, or end stage renal disease: Secondary | ICD-10-CM | POA: Diagnosis present

## 2024-06-17 DIAGNOSIS — I130 Hypertensive heart and chronic kidney disease with heart failure and stage 1 through stage 4 chronic kidney disease, or unspecified chronic kidney disease: Secondary | ICD-10-CM | POA: Diagnosis not present

## 2024-06-17 DIAGNOSIS — K922 Gastrointestinal hemorrhage, unspecified: Secondary | ICD-10-CM

## 2024-06-17 HISTORY — PX: TRANSESOPHAGEAL ECHOCARDIOGRAM (CATH LAB): EP1270

## 2024-06-17 HISTORY — PX: LEFT ATRIAL APPENDAGE OCCLUSION: EP1229

## 2024-06-17 LAB — CBC
HCT: 35.1 % — ABNORMAL LOW (ref 39.0–52.0)
Hemoglobin: 10.8 g/dL — ABNORMAL LOW (ref 13.0–17.0)
MCH: 29.5 pg (ref 26.0–34.0)
MCHC: 30.8 g/dL (ref 30.0–36.0)
MCV: 95.9 fL (ref 80.0–100.0)
Platelets: 193 10*3/uL (ref 150–400)
RBC: 3.66 MIL/uL — ABNORMAL LOW (ref 4.22–5.81)
RDW: 15.4 % (ref 11.5–15.5)
WBC: 6.9 10*3/uL (ref 4.0–10.5)
nRBC: 0 % (ref 0.0–0.2)

## 2024-06-17 LAB — TYPE AND SCREEN
ABO/RH(D): O POS
Antibody Screen: NEGATIVE

## 2024-06-17 LAB — BASIC METABOLIC PANEL WITH GFR
Anion gap: 11 (ref 5–15)
BUN: 13 mg/dL (ref 8–23)
CO2: 29 mmol/L (ref 22–32)
Calcium: 8.7 mg/dL — ABNORMAL LOW (ref 8.9–10.3)
Chloride: 92 mmol/L — ABNORMAL LOW (ref 98–111)
Creatinine, Ser: 3.45 mg/dL — ABNORMAL HIGH (ref 0.61–1.24)
GFR, Estimated: 17 mL/min — ABNORMAL LOW (ref >60)
Glucose, Bld: 98 mg/dL (ref 70–99)
Potassium: 3.2 mmol/L — ABNORMAL LOW (ref 3.5–5.1)
Sodium: 132 mmol/L — ABNORMAL LOW (ref 135–145)

## 2024-06-17 LAB — SURGICAL PCR SCREEN
MRSA, PCR: NEGATIVE
Staphylococcus aureus: NEGATIVE

## 2024-06-17 LAB — POCT ACTIVATED CLOTTING TIME: Activated Clotting Time: 279 s

## 2024-06-17 LAB — ECHO TEE

## 2024-06-17 MED ORDER — SODIUM CHLORIDE 0.9 % IV SOLN: INTRAVENOUS | Status: DC | PRN

## 2024-06-17 MED ORDER — SODIUM CHLORIDE 0.9% FLUSH: 3.0000 mL | Freq: Two times a day (BID) | INTRAVENOUS | Status: DC

## 2024-06-17 MED ORDER — APIXABAN 5 MG PO TABS
ORAL_TABLET | ORAL | Status: AC
  Filled 2024-06-17: qty 1

## 2024-06-17 MED ORDER — PROPOFOL 10 MG/ML IV BOLUS
INTRAVENOUS | Status: DC | PRN
  Administered 2024-06-17: 80 mg via INTRAVENOUS

## 2024-06-17 MED ORDER — IOHEXOL 350 MG/ML SOLN
INTRAVENOUS | Status: DC | PRN
  Administered 2024-06-17: 10 mL

## 2024-06-17 MED ORDER — PHENYLEPHRINE 80 MCG/ML (10ML) SYRINGE FOR IV PUSH (FOR BLOOD PRESSURE SUPPORT)
PREFILLED_SYRINGE | INTRAVENOUS | Status: DC | PRN
  Administered 2024-06-17 (×2): 80 ug via INTRAVENOUS

## 2024-06-17 MED ORDER — CHLORHEXIDINE GLUCONATE 0.12 % MT SOLN
OROMUCOSAL | Status: AC
  Administered 2024-06-17: 15 mL
  Filled 2024-06-17: qty 15

## 2024-06-17 MED ORDER — FENTANYL CITRATE (PF) 250 MCG/5ML IJ SOLN
INTRAMUSCULAR | Status: DC | PRN
  Administered 2024-06-17: 100 ug via INTRAVENOUS

## 2024-06-17 MED ORDER — ROCURONIUM BROMIDE 10 MG/ML (PF) SYRINGE
PREFILLED_SYRINGE | INTRAVENOUS | Status: DC | PRN
  Administered 2024-06-17: 50 mg via INTRAVENOUS

## 2024-06-17 MED ORDER — LACTATED RINGERS IV SOLN: INTRAVENOUS | Status: DC

## 2024-06-17 MED ORDER — SUGAMMADEX SODIUM 200 MG/2ML IV SOLN
INTRAVENOUS | Status: DC | PRN
  Administered 2024-06-17: 200 mg via INTRAVENOUS

## 2024-06-17 MED ORDER — DEXAMETHASONE SODIUM PHOSPHATE 10 MG/ML IJ SOLN
INTRAMUSCULAR | Status: DC | PRN
  Administered 2024-06-17: 10 mg via INTRAVENOUS

## 2024-06-17 MED ORDER — ACETAMINOPHEN 325 MG PO TABS: 650.0000 mg | ORAL_TABLET | ORAL | Status: DC | PRN

## 2024-06-17 MED ORDER — FENTANYL CITRATE (PF) 100 MCG/2ML IJ SOLN
INTRAMUSCULAR | Status: AC
Start: 2024-06-17 — End: 2024-06-17
  Filled 2024-06-17: qty 2

## 2024-06-17 MED ORDER — PROTAMINE SULFATE 10 MG/ML IV SOLN
INTRAVENOUS | Status: DC | PRN
  Administered 2024-06-17: 35 mg via INTRAVENOUS

## 2024-06-17 MED ORDER — ONDANSETRON HCL 4 MG/2ML IJ SOLN: 4.0000 mg | Freq: Four times a day (QID) | INTRAMUSCULAR | Status: DC | PRN

## 2024-06-17 MED ORDER — CEFAZOLIN SODIUM-DEXTROSE 2-4 GM/100ML-% IV SOLN
2.0000 g | INTRAVENOUS | Status: AC
  Administered 2024-06-17: 2 g via INTRAVENOUS
  Filled 2024-06-17: qty 100

## 2024-06-17 MED ORDER — HEPARIN SODIUM (PORCINE) 1000 UNIT/ML IJ SOLN
INTRAMUSCULAR | Status: DC | PRN
  Administered 2024-06-17: 11000 [IU] via INTRAVENOUS
  Administered 2024-06-17: 1000 [IU] via INTRAVENOUS

## 2024-06-17 MED ORDER — APIXABAN 2.5 MG PO TABS
2.5000 mg | ORAL_TABLET | Freq: Once | ORAL | Status: AC
  Administered 2024-06-17: 2.5 mg via ORAL
  Filled 2024-06-17 (×2): qty 1

## 2024-06-17 MED ORDER — AMLODIPINE BESYLATE 5 MG PO TABS
ORAL_TABLET | ORAL | Status: AC
  Filled 2024-06-17: qty 1

## 2024-06-17 MED ORDER — SODIUM CHLORIDE 0.9 % IV SOLN: 250.0000 mL | INTRAVENOUS | Status: DC | PRN

## 2024-06-17 MED ORDER — ONDANSETRON HCL 4 MG/2ML IJ SOLN
INTRAMUSCULAR | Status: DC | PRN
  Administered 2024-06-17: 4 mg via INTRAVENOUS

## 2024-06-17 MED ORDER — CHLORHEXIDINE GLUCONATE 4 % EX SOLN: Freq: Once | CUTANEOUS | Status: DC

## 2024-06-17 MED ORDER — LIDOCAINE 2% (20 MG/ML) 5 ML SYRINGE
INTRAMUSCULAR | Status: DC | PRN
  Administered 2024-06-17: 40 mg via INTRAVENOUS

## 2024-06-17 MED ORDER — SODIUM CHLORIDE 0.9% FLUSH: 3.0000 mL | INTRAVENOUS | Status: DC | PRN

## 2024-06-17 MED ORDER — SODIUM CHLORIDE 0.9 % IV SOLN: INTRAVENOUS | Status: DC

## 2024-06-17 MED ORDER — AMLODIPINE BESYLATE 5 MG PO TABS
2.5000 mg | ORAL_TABLET | Freq: Once | ORAL | Status: AC
  Administered 2024-06-17: 2.5 mg via ORAL

## 2024-06-17 NOTE — Discharge Summary (Addendum)
 Electrophysiology Discharge Summary   Patient ID: Jacob Farmer,  MRN: [id number], DOB/AGE: [birth year] 78 y.o.  Admit date: 06/17/2024 Discharge date: 06/17/2024  Primary Care Physician: Tobie Suzzane POUR, MD  Primary Cardiologist: Alvan Carrier, MD  Electrophysiologist: Fonda Kitty, MD     Primary Discharge Diagnosis:  Paroxysmal Atrial Fibrillation Poor candidacy for long term anticoagulation due to h/o intracranial hemorrhage & GIB   Secondary Discharge Diagnosis:  CAD Tachy-Brady Syndrome  HFpEF  HTN DM II  RA  ESRD on HD   Procedures This Admission:  Transeptal Puncture Intra-procedural TEE which showed no LAA thrombus Left atrial appendage occlusive device placement on 06/17/24 by Dr. Kitty.    This study demonstrated: 1.Successful implantation of a 27mm WATCHMAN Flx Pro left atrial appendage occlusive device    2. TEE demonstrating no LAA thrombus 3. No early apparent complications.    Post Implant Anticoagulation Strategy: Continue Eliquis  2.5mg  twice daily for 45 days then transition to DAPT with aspirin  81mg  and Plavix 75mg  daily. Would continue DAPT indefinitely, if tolerated, due to extent of aortic atheromatous disease. Repeat imaging to reassess Watchman device in 60 days.      Brief HPI: Jacob Farmer is a 78 y.o. male with a history of Paroxysmal Atrial Fibrillation who was referred to Electrophysiology in the outpatient setting for evaluation for Watchman Device in setting of need for [facility] with hx of GIB and ICH. He was hospitalized in May 2020 for with acute blood loss anemia secondary to GI bleeding which required transfusion of 2 units of blood. 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 Hemoglobin remained stable and he was discharged off Eliquis . Patient had recurrence of symptomatic anemia and was found to have a hemoglobin of 4.9 in July 2024 and again required transfusion of blood, this time 4  units PRBCs. He remained off Eliquis  after this visit. He then presented in September 2024 to the ED with an acute stroke. This was felt to be due to cardioembolic origin from his atrial fibrillation off oral anticoagulation. 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 He then required surgery for placement of AV fistula in his left upper extremity that required time for maturity. His tunneled HD catheter was ultimately removed.    Hospital Course:  The patient was admitted and underwent left atrial appendage occlusive device placement as above.  The patient was monitored in the post procedure setting and has done very well with no concerns. Given this, he/she is being considered for same day discharge later today. Groin site has been stable without evidence of hematoma or bleeding. Wound care and restrictions were reviewed with the patient.   The patient has been scheduled for post procedure follow up with EP APP in approximately 6 weeks. They will restart Eliquis  this evening and continue for 45 days then stop. At that time he will transition to Plavix 75mg  daily to complete 6 months of therapy. They will require dental SBE for 6 month post op and should refrain from dental work or cleanings for the first 45 days post implant. SBE to be RXd at follow up.   A repeat CT scan will be performed in approximately 60 days to ensure proper seal of the device.    Physical Exam: Vitals:   06/17/24 1300 06/17/24 1330 06/17/24 1400 06/17/24 1438  BP: (!) 160/72 ROLLEN)  152/62 (!) 150/72 (!) 165/85  Pulse: 71 73 73 75  Resp: 11 12 11 20   Temp:    97.7 F (36.5 C)  TempSrc:    Oral  SpO2: 100% 100% 99% 100%  Weight:      Height:        GEN: pleasant, well nourished elderly male, well developed in no acute distress NECK: No JVD; No carotid bruits CARDIAC: Regular rate and rhythm, no murmurs, rubs,  gallops RESPIRATORY:  Clear to auscultation without rales, wheezing or rhonchi  ABDOMEN: Soft, non-tender, non-distended EXTREMITIES:  No edema; No deformity. Groin site Stable     Discharge Medications:  Allergies as of 06/17/2024   No Known Allergies      Medication List     TAKE these medications    allopurinol  100 MG tablet Commonly known as: ZYLOPRIM  TAKE 1/2 TABLET EVERY OTHER DAY   amLODipine  5 MG tablet Commonly known as: NORVASC  TAKE 1 TABLET EVERY DAY   apixaban  2.5 MG Tabs tablet Commonly known as: ELIQUIS  Take 1 tablet (2.5 mg total) by mouth 2 (two) times daily.   aspirin  EC 81 MG tablet Take 81 mg by mouth in the morning. Swallow whole.   calcitRIOL  0.25 MCG capsule Commonly known as: ROCALTROL  Take 0.25 mcg by mouth every Monday, Wednesday, and Friday. At dialysis   Dupixent  300 MG/2ML Soaj Generic drug: Dupilumab  Inject 300 mg into the skin every 14 (fourteen) days. Starting at day 15 for maintenance.   ELIGARD  [place] Inject 1 Dose into the skin every 4 (four) months.   folic acid  400 MCG tablet Commonly known as: FOLVITE  Take 400 mcg by mouth in the morning.   leflunomide  10 MG tablet Commonly known as: ARAVA  TAKE 1 TABLET EVERY DAY   mupirocin  ointment 2 % Commonly known as: BACTROBAN  Apply 1 Application topically 2 (two) times daily.   pantoprazole  40 MG tablet Commonly known as: PROTONIX  TAKE 1 TABLET TWICE DAILY   Renvela  800 MG tablet Generic drug: sevelamer  carbonate Take 800 mg by mouth 2 (two) times daily with a meal.   rosuvastatin  40 MG tablet Commonly known as: CRESTOR  Take 1 tablet (40 mg total) by mouth daily. What changed: when to take this   triamcinolone  cream 0.1 % Commonly known as: KENALOG  Apply 1 Application topically daily as needed (irritation). What changed:  when to take this reasons to take this        Disposition:  Home with usual follow up as in AVS  Signed, Daphne Barrack, NP-C, AGACNP-BC [facility] - Electrophysiology  06/17/2024, 3:56 PM  I have seen, examined the patient, and reviewed the above assessment and plan.    Hospital Course:  Patient presented for planned Watchman implant. Underwent successful implant of a 27mm Watchman Flx Pro device. He underwent usual post procedure monitoring with no immediate complications and was discharged.  General: Well developed, in no acute distress.  Neck: No JVD.  Cardiac: Normal rate, regular rhythm.  Resp: Normal work of breathing.  Ext: No edema.  Groins: Soft bilaterally without bleeding or hematoma Neuro: No gross focal deficits.  Psych: Normal affect.   Plan:  Continue Eliquis  2.5mg  BID for 45 days then transition to DAPT with aspirin  81mg  and Plavix 75mg  daily. 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 Repeat imaging to assess Watchman device in 60 days.   Duration of Discharge Encounter: 45 minutes  Fonda Kitty, MD 06/19/2024 8:52 PM

## 2024-06-17 NOTE — Progress Notes (Signed)
 Printed and reviewed AVS with patient and family  all questions answered.  Floor to finish diischarge

## 2024-06-17 NOTE — H&P (Addendum)
 Electrophysiology Office Note:   Date:  04/09/2024  ID:  Jacob Farmer, DOB Apr 26, 1946, MRN [id number]   Primary Cardiologist: Alvan Carrier, MD Electrophysiologist: Fonda Kitty, MD       History of Present Illness:   Jacob Farmer is a 78 y.o. male with h/o coronary disease, paroxysmal atrial fibrillation, tachybradycardia syndrome, HFpEF, type 2 diabetes, essential hypertension with bouts of orthostatic hypotension, rheumatoid and osteoarthritis, end-stage renal disease, prostate cancer, GI bleeding and stroke who was initially seen for evaluation for Watchman device implant. Patient was hospitalized in May 2020 for with acute blood loss anemia secondary to GI bleeding which required transfusion of 2 units of blood.  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  Hemoglobin remained stable and he was discharged off Eliquis .  Patient had recurrence of symptomatic anemia and was found to have a hemoglobin of 4.9 in July 2024 and again required transfusion of blood, this time 4 units PRBCs.  He remained off Eliquis  after this visit.  He then presented in September 2024 to the ED with an acute stroke.  This was felt to be due to cardioembolic origin from his atrial fibrillation off oral anticoagulation.  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     Discussed the use of AI scribe software for clinical note transcription with the patient, who gave verbal consent to proceed.   History of Present Illness Patient continues to report doing relatively well since last clinic visit.  He had surgery for placement of AV fistula in his left upper extremity.  It has not fully matured, so he has not yet started using it and still has a tunneled dialysis catheter placed.  He is supposed to undergo ballooning of his fistula soon and is hoping that he will  start using it in the next month.  He is currently tolerating Eliquis . He feels weak and wobbly in his legs after dialysis sessions, affecting his alertness and energy levels. No falls have occurred, and he uses walls for support at home to maintain balance. He occasionally uses a wheelchair for longer distances when feeling unsteady. He is in his second week of bronchitis and has completed a course of azithromycin . He feels much better.   Interval: Patient presents today for scheduled Watchman implant. His dialysis fistula has matured. Tunneled dialysis line has been removed. Doing well. No new or acute complaints today.   Review of systems complete and found to be negative unless listed in HPI.    EP Information / Studies Reviewed:     EKG is not ordered today. EKG from 02/13/24 reviewed which showed sinus rhythm with RBBB and first degree AV block.        Echo 09/12/23: 1. Left ventricular ejection fraction, by estimation, is 60 to 65%. The  left ventricle has normal function. The left ventricle demonstrates  regional wall motion abnormalities (see scoring diagram/findings for  description). Left ventricular diastolic  parameters are consistent with Grade I diastolic dysfunction (impaired  relaxation).   2. Right ventricular systolic function is normal. The right ventricular  size is normal. There is normal pulmonary artery systolic pressure.   3. The mitral valve is normal in structure. No evidence of mitral valve  regurgitation. No evidence of mitral stenosis.   4. The aortic valve is tricuspid. Aortic valve regurgitation is not  visualized. No  aortic stenosis is present.   5. Aortic dilatation noted. There is mild dilatation of the aortic root,  measuring 41 mm.   6. The inferior vena cava is normal in size with greater than 50%  respiratory variability, suggesting right atrial pressure of 3 mmHg.      Zio 12/19/23:  HR 33 - 146, average 72 bpm. 2 nonsustained VT (longest 6  beats) Atrial flutter and/or coarse atrial fibrillation detected. Longest episode was 11 minutes and 23 seconds. Occasional supraventricular ectopy, 2.5%. Rare ventricular ectopy. Nocturnal Mobitz 1 AV block was seen. No symptom trigger episodes.   Risk Assessment/Calculations:   CHA2DS2-VASc Score = 8  The patient's score is based upon: CHF History: 1 HTN History: 1 Diabetes History: 1 Stroke History: 2 Vascular Disease History: 1 Age Score: 2 Gender Score: 0                     Physical Exam:    Today's Vitals   06/17/24 0547 06/17/24 0609  BP: (!) 170/78   Pulse: 70   Resp: 17   Temp: 98.2 F (36.8 C)   TempSrc: Oral   SpO2: 99%   Weight: 68.9 kg   Height: 5' 10 (1.778 m)   PainSc:  0-No pain   Body mass index is 21.81 kg/m.   GEN: Well nourished, well developed in no acute distress NECK: No JVD CARDIAC: Normal rate and regular. RESPIRATORY:  Clear to auscultation without rales, wheezing or rhonchi  ABDOMEN: Soft, non-distended EXTREMITIES:  No edema; No deformity    ASSESSMENT AND PLAN:   I have seen Jacob Farmer in the office today who is being considered for a Watchman left atrial appendage closure device. I believe they will benefit from this procedure given their history of atrial fibrillation, CHA2DS2-VASc score of 8 and unadjusted risk of stroke/TIA/systemic embolism rate of 15.2% per year. Unfortunately, the patient is not felt to be a long term anticoagulation candidate secondary to GI bleeding and intracranial hemorrhage. The patient's chart has been reviewed and I feel that they would be a candidate for short term oral anticoagulation after Watchman implant.    It is my belief that after undergoing a LAA closure procedure, Jacob Farmer will not need long term anticoagulation which eliminates anticoagulation side effects and major bleeding risk.    Procedural risks for the Watchman implant have been reviewed with the patient including a 0.5%  risk of stroke, <1% risk of perforation and <1% risk of device embolization. Other risks include bleeding, vascular damage, tamponade, worsening renal function, and death. The patient understands these risk and wishes to proceed.       The published clinical data on the safety and effectiveness of WATCHMAN include but are not limited to the following: - Holmes DR, Jess BEARD, Sick P et al. for the PROTECT AF Investigators. Percutaneous closure of the left atrial appendage versus warfarin therapy for prevention of stroke in patients with atrial fibrillation: a randomised non-inferiority trial. Lancet 2009; 374: 534-42. GLENWOOD Jess BEARD, Doshi SK, Jonita VEAR Satchel D et al. on behalf of the PROTECT AF Investigators. Percutaneous Left Atrial Appendage Closure for Stroke Prophylaxis in Patients With Atrial Fibrillation 2.3-Year Follow-up of the PROTECT AF (Watchman Left Atrial Appendage System for Embolic Protection in Patients With Atrial Fibrillation) Trial. Circulation 2013; 127:720-729. - Alli O, Doshi S,  Kar S, Reddy VY, Sievert H et al. Quality of Life Assessment in the Randomized PROTECT AF (Percutaneous Closure of the  Left Atrial Appendage Versus Warfarin Therapy for Prevention of Stroke in Patients With Atrial Fibrillation) Trial of Patients at Risk for Stroke With Nonvalvular Atrial Fibrillation. J Am Coll Cardiol 2013; 61:1790-8. GLENWOOD Satchel DR, Archer RAMAN, Price M, Whisenant B, Sievert H, Doshi S, Huber K, Reddy V. Prospective randomized evaluation of the Watchman left atrial appendage Device in patients with atrial fibrillation versus long-term warfarin therapy; the PREVAIL trial. Journal of the [employer] of Cardiology, Vol. 4, No. 1, 2014, 1-11. - Kar S, Doshi SK, Sadhu A, Horton R, Osorio J et al. Primary outcome evaluation of a next-generation left atrial appendage closure device: results from the PINNACLE FLX trial. Circulation 2021;143(18)1754-1762.    After today's visit with the patient which was  dedicated solely for shared decision making visit regarding LAA closure device, the patient decided to proceed with the LAA appendage closure procedure scheduled to be done in the near future at [facility]. Prior to the procedure, I would like to obtain a gated CT scan of the chest with contrast timed for PV/LA visualization.    HAS-BLED score 5 Hypertension Yes  Abnormal renal and liver function (Dialysis, transplant, Cr >2.26 mg/dL /Cirrhosis or Bilirubin >2x Normal or AST/ALT/AP >3x Normal) Yes  Stroke Yes  Bleeding Yes  Labile INR (Unstable/high INR) No  Elderly (>65) Yes  Drugs or alcohol  (>= 8 drinks/week, anti-plt or NSAID) No    CHA2DS2-VASc Score = 8  The patient's score is based upon: CHF History: 1 - NYHA class II HTN History: 1 Diabetes History: 1 Stroke History: 2 Vascular Disease History: 1 Age Score: 2 Gender Score: 0         ASSESSMENT AND PLAN: #. Paroxysmal atrial fibrillation #. Secondary hypercoagulable state due to atrial fibrillation #. GI bleeding #. History of CVA #. ESRD on hemodialysis   -Plan for Watchman implant today. Continue Eliquis  2.5mg  BID for 45 days then transition to DAPT - aspirin  81mg  and Plavix 75mg  daily. Repeat imaging at 60 days.     Signed, Fonda Kitty, MD

## 2024-06-17 NOTE — Discharge Instructions (Signed)
 WATCHMANT Procedure, Care After  Procedure MD: Dr. Alene Mires Clinical Coordinator: Karsten Fells, RN  This sheet gives you information about how to care for yourself after your procedure. Your health care provider may also give you more specific instructions. If you have problems or questions, contact your health care provider.  What can I expect after the procedure? After the procedure, it is common to have: Bruising around your puncture site. Tenderness around your puncture site. Tiredness (fatigue).  Medication instructions It is very important to continue to take your blood thinner as directed by your doctor after the Watchman procedure. Call your procedure doctor's office with question or concerns. If you are on Coumadin (warfarin), you will have your INR checked the week after your procedure, with a goal INR of 2.0 - 3.0. Please follow your medication instructions on your discharge summary. Only take the medications listed on your discharge paperwork.  Follow up You will be seen in 6 weeks after your procedure You will have a repeat CT scan or Echocardiogram approximately 8 weeks after your procedure mark to check your device You will follow up the MD/APP who performed your procedure 6 months after your procedure The Watchman Clinical Coordinator will check in with you from time to time, including 1 and 2 years after your procedure.  NO DENTAL CLEANINGS FOR 45 days. After that, you will require antibiotics for dental procedures the first 6 months.   Follow these instructions at home: Puncture site care  Follow instructions from your health care provider about how to take care of your puncture site. Make sure you: If present, leave stitches (sutures), skin glue, or adhesive strips in place.  If a large square bandage is present, this may be removed 24 hours after surgery.  Check your puncture site every day for signs of infection. Check for: Redness, swelling, or pain. Fluid  or blood. If your puncture site starts to bleed, lie down on your back, apply firm pressure to the area, and contact your health care provider. Warmth. Pus or a bad smell. Driving Do not drive yourself home if you received sedation Do not drive for at least 4 days after your procedure or however long your health care provider recommends. (Do not resume driving if you have previously been instructed not to drive for other health reasons.) Do not spend greater than 1 hour at a time in a car for the first 3 days. Stop and take a break with a 5 minute walk at least every hour.  Do not drive or use heavy machinery while taking prescription pain medicine.  Activity Avoid activities that take a lot of effort, including exercise, for at least 7 days after your procedure. For the first 3 days, avoid sitting for longer than one hour at a time.  Avoid alcoholic beverages, signing paperwork, or participating in legal proceedings for 24 hours after receiving sedation Do not lift anything that is heavier than 10 lb (4.5 kg) for one week.  No sexual activity for 1 week.  Return to your normal activities as told by your health care provider. Ask your health care provider what activities are safe for you. General instructions Take over-the-counter and prescription medicines only as told by your health care provider. Do not use any products that contain nicotine or tobacco, such as cigarettes and e-cigarettes. If you need help quitting, ask your health care provider. You may shower after 24 hours, but Do not take baths, swim, or use a hot tub for  1 week.  Do not drink alcohol for 24 hours after your procedure. Keep all follow-up visits as told by your health care provider. This is important. Dental Work: You will require antibiotics prior to any dental work, including cleanings, for 6 months after your Watchman implantation to help protect you from infection. After 6 months, antibiotics are no longer  required. Contact a health care provider if: You have redness, mild swelling, or pain around your puncture site. You have soreness in your throat or at your puncture site that does not improve after several days You have fluid or blood coming from your puncture site that stops after applying firm pressure to the area. Your puncture site feels warm to the touch. You have pus or a bad smell coming from your puncture site. You have a fever. You have chest pain or discomfort that spreads to your neck, jaw, or arm. You are sweating a lot. You feel nauseous. You have a fast or irregular heartbeat. You have shortness of breath. You are dizzy or light-headed and feel the need to lie down. You have pain or numbness in the arm or leg closest to your puncture site. Get help right away if: Your puncture site suddenly swells. Your puncture site is bleeding and the bleeding does not stop after applying firm pressure to the area. These symptoms may represent a serious problem that is an emergency. Do not wait to see if the symptoms will go away. Get medical help right away. Call your local emergency services (911 in the [place] Do not drive yourself to the hospital. Summary After the procedure, it is normal to have bruising and tenderness at the puncture site in your groin, neck, or forearm. Check your puncture site every day for signs of infection. Get help right away if your puncture site is bleeding and the bleeding does not stop after applying firm pressure to the area. This is a medical emergency.  This information is not intended to replace advice given to you by your health care provider. Make sure you discuss any questions you have with your health care provider.

## 2024-06-17 NOTE — Anesthesia Preprocedure Evaluation (Addendum)
 Anesthesia Evaluation  Patient identified by MRN, date of birth, ID band Patient awake    Reviewed: Allergy  & Precautions, NPO status , Patient's Chart, lab work & pertinent test results  History of Anesthesia Complications Negative for: history of anesthetic complications  Airway Mallampati: II  TM Distance: >3 FB Neck ROM: Full    Dental no notable dental hx. (+) Edentulous Upper, Edentulous Lower, Lower Dentures, Upper Dentures   Pulmonary neg shortness of breath, neg COPD, neg recent URI, former smoker   Pulmonary exam normal breath sounds clear to auscultation       Cardiovascular hypertension, Pt. on medications (-) angina + CAD, + Past MI and + Cardiac Stents  Normal cardiovascular exam+ dysrhythmias Atrial Fibrillation  Rhythm:Regular Rate:Normal  Echo 08/2023  1. Left ventricular ejection fraction, by estimation, is 60 to 65%. The left ventricle has normal function. The left ventricle demonstrates regional wall motion abnormalities (see scoring diagram/findings for description). Left ventricular diastolic parameters are consistent with Grade I diastolic dysfunction (impaired relaxation).   2. Right ventricular systolic function is normal. The right ventricular size is normal. There is normal pulmonary artery systolic pressure.   3. The mitral valve is normal in structure. No evidence of mitral valve regurgitation. No evidence of mitral stenosis.   4. The aortic valve is tricuspid. Aortic valve regurgitation is not visualized. No aortic stenosis is present.   5. Aortic dilatation noted. There is mild dilatation of the aortic root, measuring 41 mm.   6. The inferior vena cava is normal in size with greater than 50% respiratory variability, suggesting right atrial pressure of 3 mmHg.     Neuro/Psych neg Seizures 9/24 cva CVA, No Residual Symptoms    GI/Hepatic Neg liver ROS, PUD,GERD  ,,  Endo/Other  diabetes  Lab Results       Component                Value               Date                      HGBA1C                   5.7                 02/03/2024             Renal/GU ESRF and DialysisRenal diseaseLab Results      Component                Value               Date                      NA                       138                 04/20/2024                K                        3.1 (L)             04/20/2024                CO2  30                  02/17/2024                GLUCOSE                  112 (H)             04/20/2024                BUN                      23                  04/20/2024                CREATININE               4.30 (H)            04/20/2024                CALCIUM                   8.3 (L)             02/17/2024                EGFR                     12 (L)              12/04/2023                GFRNONAA                 13 (L)              02/17/2024                Musculoskeletal  (+) Arthritis ,    Abdominal   Peds  Hematology  (+) Blood dyscrasia, anemia Lab Results      Component                Value               Date                      WBC                      8.0                 02/17/2024                HGB                      10.2 (L)            04/20/2024                HCT                      30.0 (L)            04/20/2024                MCV                      95.3  02/17/2024                PLT                      195                 02/17/2024              Anesthesia Other Findings   Reproductive/Obstetrics                              Anesthesia Physical Anesthesia Plan  ASA: 4  Anesthesia Plan: General   Post-op Pain Management: Minimal or no pain anticipated   Induction: Intravenous  PONV Risk Score and Plan: 2 and Ondansetron , Dexamethasone  and Treatment may vary due to age or medical condition  Airway Management Planned: Oral ETT  Additional Equipment:   Intra-op Plan:    Post-operative Plan: Extubation in OR  Informed Consent: I have reviewed the patients History and Physical, chart, labs and discussed the procedure including the risks, benefits and alternatives for the proposed anesthesia with the patient or authorized representative who has indicated his/her understanding and acceptance.     Dental advisory given  Plan Discussed with: CRNA  Anesthesia Plan Comments:          Anesthesia Quick Evaluation

## 2024-06-17 NOTE — Progress Notes (Signed)
 Dr. Kennyth made aware of high BP, given ranges. No orders.

## 2024-06-17 NOTE — Anesthesia Procedure Notes (Signed)
 Procedure Name: Intubation Date/Time: 06/17/2024 8:12 AM  Performed by: Arvell Edsel HERO, CRNAPre-anesthesia Checklist: Patient identified, Emergency Drugs available, Suction available, Patient being monitored and Timeout performed Patient Re-evaluated:Patient Re-evaluated prior to induction Oxygen Delivery Method: Circle system utilized Preoxygenation: Pre-oxygenation with 100% oxygen Induction Type: IV induction Ventilation: Mask ventilation without difficulty Laryngoscope Size: Mac and 3 Grade View: Grade I Tube type: Oral Tube size: 7.0 mm Number of attempts: 1 Airway Equipment and Method: Stylet and Patient positioned with wedge pillow Placement Confirmation: positive ETCO2, breath sounds checked- equal and bilateral, CO2 detector and ETT inserted through vocal cords under direct vision Secured at: 22 cm Tube secured with: Tape

## 2024-06-17 NOTE — Transfer of Care (Signed)
 Immediate Anesthesia Transfer of Care Note  Patient: Jacob Farmer  Procedure(s) Performed: LEFT ATRIAL APPENDAGE OCCLUSION TRANSESOPHAGEAL ECHOCARDIOGRAM  Patient Location: PACU  Anesthesia Type:General  Level of Consciousness: awake, alert , oriented, and patient cooperative  Airway & Oxygen Therapy: Patient Spontanous Breathing and Patient connected to face mask oxygen  Post-op Assessment: Report given to RN, Post -op Vital signs reviewed and stable, Patient moving all extremities, and Patient moving all extremities X 4  Post vital signs: Reviewed and stable  Last Vitals:  Vitals Value Taken Time  BP 193/80 06/17/24 10:00  Temp 36.3 C 06/17/24 09:59  Pulse 69 06/17/24 10:03  Resp 12 06/17/24 10:03  SpO2 99 % 06/17/24 10:03  Vitals shown include unfiled device data.  Last Pain:  Vitals:   06/17/24 0959  TempSrc: Temporal  PainSc: 0-No pain         Complications: There were no known notable events for this encounter.

## 2024-06-18 DIAGNOSIS — N2581 Secondary hyperparathyroidism of renal origin: Secondary | ICD-10-CM | POA: Diagnosis not present

## 2024-06-18 DIAGNOSIS — N186 End stage renal disease: Secondary | ICD-10-CM | POA: Diagnosis not present

## 2024-06-18 DIAGNOSIS — Z992 Dependence on renal dialysis: Secondary | ICD-10-CM | POA: Diagnosis not present

## 2024-06-19 ENCOUNTER — Encounter (HOSPITAL_COMMUNITY): Payer: Self-pay | Admitting: Cardiology

## 2024-06-20 NOTE — Anesthesia Postprocedure Evaluation (Signed)
 Anesthesia Post Note  Patient: Jacob Farmer  Procedure(s) Performed: LEFT ATRIAL APPENDAGE OCCLUSION TRANSESOPHAGEAL ECHOCARDIOGRAM     Patient location during evaluation: PACU Anesthesia Type: General Level of consciousness: sedated and patient cooperative Pain management: pain level controlled Vital Signs Assessment: post-procedure vital signs reviewed and stable Respiratory status: spontaneous breathing Cardiovascular status: stable Anesthetic complications: no   There were no known notable events for this encounter.  Last Vitals:  Vitals:   06/17/24 1438 06/17/24 1609  BP: (!) 165/85 (!) 163/73  Pulse: 75 75  Resp: 20 18  Temp: 36.5 C (!) 36.3 C  SpO2: 100% 100%    Last Pain:  Vitals:   06/17/24 1730  TempSrc:   PainSc: 0-No pain                 Norleen Pope

## 2024-06-21 DIAGNOSIS — N2581 Secondary hyperparathyroidism of renal origin: Secondary | ICD-10-CM | POA: Diagnosis not present

## 2024-06-21 DIAGNOSIS — Z992 Dependence on renal dialysis: Secondary | ICD-10-CM | POA: Diagnosis not present

## 2024-06-21 DIAGNOSIS — N186 End stage renal disease: Secondary | ICD-10-CM | POA: Diagnosis not present

## 2024-06-22 ENCOUNTER — Telehealth: Payer: Self-pay

## 2024-06-22 ENCOUNTER — Other Ambulatory Visit: Payer: Medicare HMO

## 2024-06-22 DIAGNOSIS — I480 Paroxysmal atrial fibrillation: Secondary | ICD-10-CM

## 2024-06-22 DIAGNOSIS — Z8719 Personal history of other diseases of the digestive system: Secondary | ICD-10-CM

## 2024-06-22 NOTE — Telephone Encounter (Signed)
 Attempted TOC call. Left message to call back.

## 2024-06-23 DIAGNOSIS — N186 End stage renal disease: Secondary | ICD-10-CM | POA: Diagnosis not present

## 2024-06-23 DIAGNOSIS — N2581 Secondary hyperparathyroidism of renal origin: Secondary | ICD-10-CM | POA: Diagnosis not present

## 2024-06-23 DIAGNOSIS — Z992 Dependence on renal dialysis: Secondary | ICD-10-CM | POA: Diagnosis not present

## 2024-06-23 NOTE — Addendum Note (Signed)
 Addended by: DELORES TINNIE ORN on: 06/23/2024 01:55 PM   Modules accepted: Orders

## 2024-06-23 NOTE — Telephone Encounter (Signed)
  [facility]   Watchman Team  Contacted the patient regarding discharge from [facility] on 06/17/2024  The patient understands to follow up with Jodie Passey PA-C on 8/19 at 11:20 AM (Apt was rescheduled from 8/18 d/t HD MWF)  The patient understands discharge instructions? Yes  The patient understands medications and regimen? Yes   The patient reports groin site looks okay, no issues.   The patient understands to call with any questions or concerns prior to scheduled visit.   CT scheduled on 9/18. Per pt's wife , Dr Kennyth told them at discharge that he would see them in 3 months. Appointment scheduled with Dr Kennyth on 10/07/2024.

## 2024-06-24 MED FILL — Fentanyl Citrate Preservative Free (PF) Inj 100 MCG/2ML: INTRAMUSCULAR | Qty: 2 | Status: AC

## 2024-06-25 DIAGNOSIS — Z992 Dependence on renal dialysis: Secondary | ICD-10-CM | POA: Diagnosis not present

## 2024-06-25 DIAGNOSIS — N186 End stage renal disease: Secondary | ICD-10-CM | POA: Diagnosis not present

## 2024-06-25 DIAGNOSIS — N2581 Secondary hyperparathyroidism of renal origin: Secondary | ICD-10-CM | POA: Diagnosis not present

## 2024-06-26 NOTE — Progress Notes (Unsigned)
 Cardiology Office Note    Date:  06/29/2024  ID:  ELISHA COOKSEY, DOB Apr 25, 1946, MRN [id number] Cardiologist: Alvan Carrier, MD   EP: Dr. Kennyth  History of Present Illness:    Jacob Farmer is a 78 y.o. male with past medical history of CAD (s/p stents in 1999 to LCx and RCA with CTO of LAD), paroxysmal atrial fibrillation, tachy-brady syndrome, chronic HFpEF, orthostatic hypotension, HTN, HLD, Type 2 DM, RA, OA, history of GIB (known AVMs and duodenal ulcers), history of CVA (occurring in 08/2023), history of prostate cancer and ESRD who presents to the office today for 4-month follow-up and hospital follow-up.  He was examined by myself in 12/2023 and reported overall feeling well at that time and denied any recent chest pain or palpitations. BP was elevated but improved on recheck and he was also orthostatic but denied any associated symptoms. He was continued on ASA 81 mg daily, Crestor  40 mg daily and Amlodipine  5 mg daily. He had been off anticoagulation given prior GI bleeding requiring transfusion and given that he did previously suffer a CVA, he was being followed by Dr. Kennyth for consideration of Watchman device placement.  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 He ultimately underwent implantation of a 27 mm Watchman Flx Pro left atrial appendage occlusive device on 06/17/2024 by Dr. Kennyth with no immediate complications noted. Was recommended to be on Eliquis  for 45 days and then stop. At that time, would transition to Plavix 75 mg daily to complete 6 months of therapy. Was noted that he would require dental SBE for 6 months.  In talking with the patient and his wife today, he reports no complications regarding his recent Watchman device. Says he initially had some bleeding along his groin sites while in the hospital but no recurrence and no swelling noted. He denies any recent exertional chest pain or  persistent palpitations. No specific orthopnea, PND or pitting edema. He still produces urine and says they have been able to reduce the amount of fluid removed during dialysis. Says that his dizziness and episodes of hypotension have improved with this and he also holds Amlodipine  until the evening. He is currently on Eliquis  and ASA 81 mg daily with no reports of active bleeding.   Studies Reviewed:   EKG: EKG is not ordered today.  TEE: 06/2024 IMPRESSIONS     1. There is no left ventricular thrombus. Left ventricular ejection  fraction, by estimation, is 50 to 55%. The left ventricle has low normal  function. The left ventricle demonstrates regional wall motion  abnormalities (see scoring diagram/findings for  description).   2. Right ventricular systolic function is normal. The right ventricular  size is normal.   3. At the end of the procedure there is a well-seated 27 mm Watchman FLX  device in the left atrial appendage, with adequate compression and no  peri-device leak. No left atrial/left atrial appendage thrombus was  detected.   4. The mitral valve is normal in structure. No evidence of mitral valve  regurgitation. No evidence of mitral stenosis.   5. The aortic valve is normal in structure. Aortic valve regurgitation is  not visualized. No aortic stenosis is present.   6. Severe thick atheromatous plaque (up to 10 mm thickness), with  multiple ulcerations and multiple hypermobile thrombi (up to 20 mm in  length) present in the descending aorta, but also in the distal aortic  arch. The ascending aort up to the innominate  origin is relatively free of plaque and no mobile debris is seen. The  proximal aortic arch is not seen. Aortic dilatation noted. There is mild  dilatation of the aortic root, measuring 42 mm. There is Severe, mobile  (Grade V) plaque involving the aortic  arch and descending aorta.   7. The inferior vena cava is normal in size with greater than 50%   respiratory variability, suggesting right atrial pressure of 3 mmHg.   8. After the procedure there is a small iatrogenic secundum atrial septal  defect with left to right flow.   9. 3D performed of the LAA and demonstrates showing hermetic sealing of  the appendage ostium.   Risk Assessment/Calculations:    CHA2DS2-VASc Score = 8   This indicates a 10.8% annual risk of stroke. The patient's score is based upon: CHF History: 1 HTN History: 1 Diabetes History: 1 Stroke History: 2 Vascular Disease History: 1 Age Score: 2 Gender Score: 0   Physical Exam:   VS:  BP 138/68   Pulse 66   Ht 5' 10 (1.778 m)   Wt 152 lb (68.9 kg)   SpO2 98%   BMI 21.81 kg/m    Wt Readings from Last 3 Encounters:  06/29/24 152 lb (68.9 kg)  06/17/24 152 lb (68.9 kg)  06/15/24 152 lb (68.9 kg)     GEN: Well nourished, well developed male appearing in no acute distress NECK: No JVD; No carotid bruits CARDIAC: RRR, no murmurs, rubs, gallops RESPIRATORY:  Clear to auscultation without rales, wheezing or rhonchi  ABDOMEN: Appears non-distended. No obvious abdominal masses. EXTREMITIES: No clubbing or cyanosis. No pitting edema.  Distal pedal pulses are 2+ bilaterally.   Assessment and Plan:   1. Coronary artery disease involving native coronary artery of native heart without angina pectoris - He previously underwent stenting to the LCx and RCA in 1999 and was noted to have a CTO of the LAD. He remains active in doing routine chores around his home when the weather permits and denies any recent anginal symptoms with this. Continue ASA 81 mg daily and Crestor  40 mg daily.  2. Paroxysmal atrial fibrillation/ History of Watchman Device Placement - He was previously having some palpitations but interestingly enough reports episodes have decreased since undergoing Watchman device placement and we reviewed this is likely unrelated. He is no longer on AV nodal blocking agents given baseline  bradycardia. - He has been on Eliquis  2.5 mg twice daily and ASA 81 mg daily since undergoing Watchman device placement. The plan is for him to continue both for 45 days and then transition to ASA 81 mg daily and Plavix 75 mg daily. He has follow-up with EP scheduled in 07/2024 and follow-up Cardiac CT in 08/2024.  3. Essential hypertension - BP was initially recorded at 156/75, rechecked and improved to 138/68. He reports this has overall been well-controlled when checked at dialysis and he is unaware of any recurrent episodes of hypotension. Continue Amlodipine  5 mg in the evening.  4. Hyperlipidemia LDL goal <70 - FLP earlier this month showed total cholesterol 127, triglycerides 102, HDL 56 and LDL 52. Continue Crestor  40 mg daily.  5. History of CVA (cerebrovascular accident) - This occurred in the setting of him being off anticoagulation in the past due to a GI bleed and he did receive TNK but developed an ICH. Remains on ASA 81 mg daily and Crestor  40 mg daily.  6. ESRD (  end stage renal disease) (HCC) - On dialysis (Monday/Wednesday/Friday schedule). Followed by Dr. Rachele.    Signed, Laymon CHRISTELLA Qua, PA-C

## 2024-06-28 DIAGNOSIS — N2581 Secondary hyperparathyroidism of renal origin: Secondary | ICD-10-CM | POA: Diagnosis not present

## 2024-06-28 DIAGNOSIS — Z992 Dependence on renal dialysis: Secondary | ICD-10-CM | POA: Diagnosis not present

## 2024-06-28 DIAGNOSIS — N186 End stage renal disease: Secondary | ICD-10-CM | POA: Diagnosis not present

## 2024-06-29 ENCOUNTER — Encounter: Payer: Self-pay | Admitting: Student

## 2024-06-29 ENCOUNTER — Ambulatory Visit: Attending: Student | Admitting: Student

## 2024-06-29 ENCOUNTER — Ambulatory Visit (INDEPENDENT_AMBULATORY_CARE_PROVIDER_SITE_OTHER): Admitting: Podiatry

## 2024-06-29 ENCOUNTER — Encounter: Payer: Self-pay | Admitting: Podiatry

## 2024-06-29 VITALS — BP 138/68 | HR 66 | Ht 70.0 in | Wt 152.0 lb

## 2024-06-29 DIAGNOSIS — I10 Essential (primary) hypertension: Secondary | ICD-10-CM

## 2024-06-29 DIAGNOSIS — M79674 Pain in right toe(s): Secondary | ICD-10-CM | POA: Diagnosis not present

## 2024-06-29 DIAGNOSIS — I480 Paroxysmal atrial fibrillation: Secondary | ICD-10-CM | POA: Diagnosis not present

## 2024-06-29 DIAGNOSIS — M79675 Pain in left toe(s): Secondary | ICD-10-CM

## 2024-06-29 DIAGNOSIS — E1122 Type 2 diabetes mellitus with diabetic chronic kidney disease: Secondary | ICD-10-CM | POA: Diagnosis not present

## 2024-06-29 DIAGNOSIS — L97512 Non-pressure chronic ulcer of other part of right foot with fat layer exposed: Secondary | ICD-10-CM

## 2024-06-29 DIAGNOSIS — I2510 Atherosclerotic heart disease of native coronary artery without angina pectoris: Secondary | ICD-10-CM | POA: Diagnosis not present

## 2024-06-29 DIAGNOSIS — Z992 Dependence on renal dialysis: Secondary | ICD-10-CM

## 2024-06-29 DIAGNOSIS — N186 End stage renal disease: Secondary | ICD-10-CM | POA: Diagnosis not present

## 2024-06-29 DIAGNOSIS — B351 Tinea unguium: Secondary | ICD-10-CM | POA: Diagnosis not present

## 2024-06-29 DIAGNOSIS — E785 Hyperlipidemia, unspecified: Secondary | ICD-10-CM | POA: Diagnosis not present

## 2024-06-29 DIAGNOSIS — Z8673 Personal history of transient ischemic attack (TIA), and cerebral infarction without residual deficits: Secondary | ICD-10-CM | POA: Diagnosis not present

## 2024-06-29 DIAGNOSIS — N184 Chronic kidney disease, stage 4 (severe): Secondary | ICD-10-CM | POA: Diagnosis not present

## 2024-06-29 NOTE — Patient Instructions (Signed)
 Medication Instructions:  Your physician recommends that you continue on your current medications as directed. Please refer to the Current Medication list given to you today.  *If you need a refill on your cardiac medications before your next appointment, please call your pharmacy*  Lab Work: NONE   If you have labs (blood work) drawn today and your tests are completely normal, you will receive your results only by: MyChart Message (if you have MyChart) OR A paper copy in the mail If you have any lab test that is abnormal or we need to change your treatment, we will call you to review the results.  Testing/Procedures: NONE   Follow-Up: At [facility], you and your health needs are our priority.  As part of our continuing mission to provide you with exceptional heart care, our providers are all part of one team.  This team includes your primary Cardiologist (physician) and Advanced Practice Providers or APPs (Physician Assistants and Nurse Practitioners) who all work together to provide you with the care you need, when you need it.  Your next appointment:   6 month(s)  Provider:   You may see Dina Rich, MD or one of the following Advanced Practice Providers on your designated Care Team:   Randall An, PA-C  Scotesia Piqua, [place] Jacolyn Reedy, [place]     We recommend signing up for the patient portal called "MyChart".  Sign up information is provided on this After Visit Summary.  MyChart is used to connect with patients for Virtual Visits (Telemedicine).  Patients are able to view lab/test results, encounter notes, upcoming appointments, etc.  Non-urgent messages can be sent to your provider as well.   To learn more about what you can do with MyChart, go to [email or web address].   Other Instructions Thank you for choosing [facility]!

## 2024-06-29 NOTE — Progress Notes (Signed)
 This patient returns to my office for at risk foot care.  This patient requires this care by a professional since this patient will be at risk due to having diabetes and ESRD. This patient is unable to cut nails himself since the patient cannot reach his nails.These nails are painful walking and wearing shoes. He presents to the office with his wife. This patient presents for at risk foot care today.  He also says he he has developed a skin lesion on the inside of his right heel.  She says he was seen by his doctor who diagnosed his skin lesion as an ulcer.  He was given topical medicine for his ulcer.  He presents for evaluation and treatment of this skin lesion.  Patient has not been seen in 14 months.  General Appearance  Alert, conversant and in no acute stress.  Vascular  Dorsalis pedis and posterior tibial  pulses are palpable  bilaterally.  Capillary return is within normal limits  bilaterally. Temperature is within normal limits  bilaterally.  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 Muscle power within normal limits bilaterally.  Nails Thick disfigured discolored nails with subungual debris  from hallux to fifth toes bilaterally. No evidence of bacterial infection or drainage bilaterally.  Orthopedic  No limitations of motion  feet .  No crepitus or effusions noted.  No bony pathology or digital deformities noted.  Skin  normotropic skin with no porokeratosis noted bilaterally.  No signs of infections or ulcers noted.   Quarter sized ulcer right heel.  Onychomycosis  Pain in right toes  Pain in left toes  Ulcer right heel.  Consent was obtained for treatment procedures.   Mechanical debridement of nails 1-5  bilaterally performed with a nail nipper.  Filed with dremel without incident.  I asked Dr.  Silva to render a second opinion on treatment of ulcer.  The patient says he needed to leave and did not have time to see Dr.  Silva.  He  was then bandaged with betadine/DSD.  Patient to be seen by Dr.  Silva in 2 weeks.     Cordella Bold DPM    Return office visit    3 months                  Told patient to return for periodic foot care and evaluation due to potential at risk complications.   Cordella Bold DPM

## 2024-06-30 DIAGNOSIS — N186 End stage renal disease: Secondary | ICD-10-CM | POA: Diagnosis not present

## 2024-06-30 DIAGNOSIS — Z992 Dependence on renal dialysis: Secondary | ICD-10-CM | POA: Diagnosis not present

## 2024-06-30 DIAGNOSIS — N2581 Secondary hyperparathyroidism of renal origin: Secondary | ICD-10-CM | POA: Diagnosis not present

## 2024-07-02 DIAGNOSIS — N2581 Secondary hyperparathyroidism of renal origin: Secondary | ICD-10-CM | POA: Diagnosis not present

## 2024-07-02 DIAGNOSIS — N186 End stage renal disease: Secondary | ICD-10-CM | POA: Diagnosis not present

## 2024-07-02 DIAGNOSIS — Z992 Dependence on renal dialysis: Secondary | ICD-10-CM | POA: Diagnosis not present

## 2024-07-05 ENCOUNTER — Ambulatory Visit: Admitting: Podiatry

## 2024-07-05 DIAGNOSIS — N186 End stage renal disease: Secondary | ICD-10-CM | POA: Diagnosis not present

## 2024-07-05 DIAGNOSIS — N2581 Secondary hyperparathyroidism of renal origin: Secondary | ICD-10-CM | POA: Diagnosis not present

## 2024-07-05 DIAGNOSIS — Z992 Dependence on renal dialysis: Secondary | ICD-10-CM | POA: Diagnosis not present

## 2024-07-07 DIAGNOSIS — N2581 Secondary hyperparathyroidism of renal origin: Secondary | ICD-10-CM | POA: Diagnosis not present

## 2024-07-07 DIAGNOSIS — N186 End stage renal disease: Secondary | ICD-10-CM | POA: Diagnosis not present

## 2024-07-07 DIAGNOSIS — Z992 Dependence on renal dialysis: Secondary | ICD-10-CM | POA: Diagnosis not present

## 2024-07-09 DIAGNOSIS — N186 End stage renal disease: Secondary | ICD-10-CM | POA: Diagnosis not present

## 2024-07-09 DIAGNOSIS — Z992 Dependence on renal dialysis: Secondary | ICD-10-CM | POA: Diagnosis not present

## 2024-07-09 DIAGNOSIS — N2581 Secondary hyperparathyroidism of renal origin: Secondary | ICD-10-CM | POA: Diagnosis not present

## 2024-07-12 DIAGNOSIS — Z992 Dependence on renal dialysis: Secondary | ICD-10-CM | POA: Diagnosis not present

## 2024-07-12 DIAGNOSIS — N2581 Secondary hyperparathyroidism of renal origin: Secondary | ICD-10-CM | POA: Diagnosis not present

## 2024-07-12 DIAGNOSIS — N186 End stage renal disease: Secondary | ICD-10-CM | POA: Diagnosis not present

## 2024-07-14 DIAGNOSIS — N2581 Secondary hyperparathyroidism of renal origin: Secondary | ICD-10-CM | POA: Diagnosis not present

## 2024-07-14 DIAGNOSIS — N186 End stage renal disease: Secondary | ICD-10-CM | POA: Diagnosis not present

## 2024-07-14 DIAGNOSIS — Z992 Dependence on renal dialysis: Secondary | ICD-10-CM | POA: Diagnosis not present

## 2024-07-14 NOTE — Progress Notes (Unsigned)
 No chief complaint on file.   HISTORY OF PRESENT ILLNESS:  07/14/24 ALL:  Jacob Farmer is a 78 y.o. 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 He was seen by Jacob Farmer 12/2023 and doing well. Left sided weakness had improved. He was taking aspirin  for atrial fib d/t hx of GI bleed and being considered for watchman device. End stage renal disease on dialysis.   Since,   He is now taking Eliquis  2.5mg  BID. Asa? BP is  on amlodipine  5mg  daily. He continues rosuvastatin  40mg  daily.    HISTORY (copied from Jacob Farmer previous note)  HPI: Jacob Farmer is a 78 year old pleasant Caucasian male seen today for office follow-up visit following hospital admission for stroke in September 2024.  He has past medical history of hypertension, hyperlipidemia, myocardial infarction, former smoking, end-stage renal disease on hemodialysis and paroxysmal atrial fibrillation not on anticoagulation due to prior history of GI bleed.  He presented on 09/12/2023 with sudden onset of left-sided weakness, numbness and facial droop.  He was at home and noticed sudden onset of symptoms in the left.  He called out to his brother and was brought to [facility] emergency room where noncontrast CT scan of the head was negative for acute abnormality and he was given IV TNK.  CT angiogram of the head and neck was negative for large vessel occlusion.  He was observed in the ICU and blood pressure was tightly controlled.  MRI scan showed a small area of parenchymal hemorrhage within the right postcentral gyrus with mild surrounding edema was due to hemorrhagic transformation.  There is a small focus of acute ischemia in the right parietal white matter as well.  Follow-up CT scan showed stable appearance of the 2.4 x 1.7 cm hemorrhagic infarct in the right posterior frontal vertex.  2D echo showed ejection fraction of 6065% with grade 1  diastolic dysfunction.  LDL cholesterol 41 mg percent.  Hemoglobin A1c was 5.3.  Patient was started only on aspirin  due to intracerebral hemorrhage for his A-fib and recommend outpatient follow-up with cardiology and consideration for Watchman device.  Patient has met Jacob Farmer electrophysiologist and plans to undergo Watchman device soon.  He states is done well since discharge left-sided weakness has improved.  He still has some diminished fine motor skills.  Tolerating aspirin  well with minor bruising and no bleeding.  He has had no recurrent stroke or TIA symptoms.    REVIEW OF SYSTEMS: Out of a complete 14 system review of symptoms, the patient complains only of the following symptoms, and all other reviewed systems are negative.   ALLERGIES: No Known Allergies   HOME MEDICATIONS: Outpatient Medications Prior to Visit  Medication Sig Dispense Refill   allopurinol  (ZYLOPRIM ) 100 MG tablet TAKE 1/2 TABLET EVERY OTHER DAY 22 tablet 3   amLODipine  (NORVASC ) 5 MG tablet TAKE 1 TABLET EVERY DAY 90 tablet 3   apixaban  (ELIQUIS ) 2.5 MG TABS tablet Take 1 tablet (2.5 mg total) by mouth 2 (two) times daily. 60 tablet 11   aspirin  EC 81 MG tablet Take 81 mg by mouth in the morning. Swallow whole.     calcitRIOL  (ROCALTROL ) 0.25 MCG capsule Take 0.25 mcg by mouth every Monday, Wednesday, and Friday. At dialysis     Dupilumab  (DUPIXENT ) 300 MG/2ML SOAJ Inject 300 mg into the skin every 14 (fourteen) days. Starting at day 15 for maintenance. 12 mL  3   folic acid  (FOLVITE ) 400 MCG tablet Take 400 mcg by mouth in the morning.     leflunomide  (ARAVA ) 10 MG tablet TAKE 1 TABLET EVERY DAY 90 tablet 0   Leuprolide  Acetate (ELIGARD  [place]) Inject 1 Dose into the skin every 4 (four) months.     mupirocin  ointment (BACTROBAN ) 2 % Apply 1 Application topically 2 (two) times daily. 22 g 0   pantoprazole  (PROTONIX ) 40 MG tablet TAKE 1 TABLET TWICE DAILY 180 tablet 3   RENVELA  800 MG tablet Take 800 mg by mouth 2  (two) times daily with a meal.     rosuvastatin  (CRESTOR ) 40 MG tablet Take 1 tablet (40 mg total) by mouth daily. 90 tablet 3   triamcinolone  cream (KENALOG ) 0.1 % Apply 1 Application topically daily as needed (irritation).     No facility-administered medications prior to visit.     PAST MEDICAL HISTORY: Past Medical History:  Diagnosis Date   Acute CVA (cerebrovascular accident) (HCC) 09/11/2023   CAD (coronary artery disease)    Acute myocardial infarction treated with TPA in 1990; 1999-stents to circumflex and RCA; residual total occlusion of the left anterior descending; normal ejection fraction; stress nuclear in 2001-distal anteroseptal ischemia; normal LV function   CKD (chronic kidney disease) stage 4, GFR 15-29 ml/min (HCC)    First degree heart block    History of gastric ulcer    History of kidney stones    History of MI (myocardial infarction)    1990-  treated w/ TPA   Hyperlipidemia    Hypertension    Jaundice    OA (osteoarthritis)    Prediabetes    Presence of Watchman left atrial appendage closure device 06/17/2024   27 mm Watchman FLX Pro, Jacob. Kennyth   Prostate cancer [facility])    Stage T1c , Gleason 3+4,  PSA 4.7,  vol 24cc--  scheduled for radiative seed implants   Psoriatic arthritis (HCC)    RA (rheumatoid arthritis) (HCC)    Jacob. Ronette   Sinus bradycardia    Wears dentures      PAST SURGICAL HISTORY: Past Surgical History:  Procedure Laterality Date   A/V FISTULAGRAM Left 04/20/2024   Procedure: A/V Fistulagram;  Surgeon: Serene Gaile ORN, MD;  Location: [facility];  Service: Cardiovascular;  Laterality: Left;   AV FISTULA PLACEMENT Left 01/27/2024   Procedure: CREATION OF LEFT ARM ARTERIOVENOUS (AV) FISTULA;  Surgeon: Sheree Penne Bruckner, MD;  Location: [facility] OR;  Service: Vascular;  Laterality: Left;   BIOPSY  08/27/2019   Procedure: BIOPSY;  Surgeon: Golda Claudis PENNER, MD;  [facility];  Service: Endoscopy;;  gastric   BIOPSY   04/03/2022   Procedure: BIOPSY;  Surgeon: Eartha Angelia Sieving, MD;  [facility];  Service: Gastroenterology;;   BIOPSY  04/21/2023   Procedure: BIOPSY;  Surgeon: Cindie Carlin POUR, DO;  [facility];  Service: Endoscopy;;   callus removal Left 09/13/2022   left foot   CARDIOVASCULAR STRESS TEST  2001  per Jacob Juventino clinic note   distal anteroseptal ischemia,  normal LVF   COLONOSCOPY  last one 2006 (approx)   COLONOSCOPY WITH PROPOFOL  N/A 04/03/2022   Procedure: COLONOSCOPY WITH PROPOFOL ;  Surgeon: Eartha Angelia Sieving, MD;  [facility];  Service: Gastroenterology;  Laterality: N/A;  945   CORONARY ANGIOPLASTY WITH STENT PLACEMENT  1999   DES x2  to CFX and RCA/  residual total occlusion LAD,  normal LVEF  ENTEROSCOPY N/A 04/24/2023   Procedure: ENTEROSCOPY;  Surgeon: Eartha Angelia Sieving, MD;  [facility];  Service: Gastroenterology;  Laterality: N/A;   ESOPHAGOGASTRODUODENOSCOPY (EGD) WITH PROPOFOL  N/A 08/27/2019   Procedure: ESOPHAGOGASTRODUODENOSCOPY (EGD) WITH PROPOFOL ;  Surgeon: Golda Claudis PENNER, MD;  [facility];  Service: Endoscopy;  Laterality: N/A;  10:10   ESOPHAGOGASTRODUODENOSCOPY (EGD) WITH PROPOFOL  N/A 04/03/2022   Procedure: ESOPHAGOGASTRODUODENOSCOPY (EGD) WITH PROPOFOL ;  Surgeon: Eartha Angelia Sieving, MD;  [facility];  Service: Gastroenterology;  Laterality: N/A;   ESOPHAGOGASTRODUODENOSCOPY (EGD) WITH PROPOFOL  N/A 02/16/2023   Procedure: ESOPHAGOGASTRODUODENOSCOPY (EGD) WITH PROPOFOL ;  Surgeon: Eartha Angelia Sieving, MD;  [facility];  Service: Gastroenterology;  Laterality: N/A;   ESOPHAGOGASTRODUODENOSCOPY (EGD) WITH PROPOFOL  N/A 04/21/2023   Procedure: ESOPHAGOGASTRODUODENOSCOPY (EGD) WITH PROPOFOL ;  Surgeon: Cindie Carlin POUR, DO;  [facility];  Service: Endoscopy;  Laterality: N/A;   EYE SURGERY Bilateral    cataract   GIVENS CAPSULE STUDY N/A 04/22/2023    Procedure: GIVENS CAPSULE STUDY;  Surgeon: Eartha Angelia Sieving, MD;  [facility];  Service: Gastroenterology;  Laterality: N/A;   HEMOSTASIS CLIP PLACEMENT  04/24/2023   Procedure: HEMOSTASIS CLIP PLACEMENT;  Surgeon: Eartha Angelia Sieving, MD;  [facility];  Service: Gastroenterology;;   HOT HEMOSTASIS  04/03/2022   Procedure: HOT HEMOSTASIS (ARGON PLASMA COAGULATION/BICAP);  Surgeon: Eartha Angelia, Sieving, MD;  [facility];  Service: Gastroenterology;;   HOT HEMOSTASIS  04/24/2023   Procedure: HOT HEMOSTASIS (ARGON PLASMA COAGULATION/BICAP);  Surgeon: Eartha Angelia, Sieving, MD;  [facility];  Service: Gastroenterology;;   IR FLUORO GUIDE CV LINE RIGHT  05/30/2023   IR REMOVAL TUN CV CATH W/O FL  06/01/2024   IR US  GUIDE VASC ACCESS RIGHT  05/30/2023   LEFT ATRIAL APPENDAGE OCCLUSION N/A 06/17/2024   Procedure: LEFT ATRIAL APPENDAGE OCCLUSION;  Surgeon: Kennyth Chew, MD;  Location: [facility] INVASIVE CV LAB;  Service: Cardiovascular;  Laterality: N/A;   LIGATION OF ARTERIOVENOUS  FISTULA Left 04/20/2024   Procedure: BRANCH LIGATION OF LEFT ARM ARTERIOVENOUS FISTULA;  Surgeon: Pearline Norman RAMAN, MD;  Location[facility] OR;  Service: Vascular;  Laterality: Left;  BRANCH LIGATION LEFT FISTULA  ARM   POLYPECTOMY  04/03/2022   Procedure: POLYPECTOMY;  Surgeon: Eartha Angelia Sieving, MD;  [facility];  Service: Gastroenterology;;   POSTERIOR CERVICAL FUSION/FORAMINOTOMY N/A 05/26/2017   Procedure: RIGHT C4-5 FORAMINOTOMY WITH EXCISION OF HERNIATED DISC;  Surgeon: Lucilla Lynwood BRAVO, MD;  Location: [facility] OR;  Service: Orthopedics;  Laterality: N/A;   RADIOACTIVE SEED IMPLANT N/A 01/11/2016   Procedure: RADIOACTIVE SEED IMPLANT/BRACHYTHERAPY IMPLANT;  Surgeon: Garnette Shack, MD;  Location[facility];  Service: Urology;  Laterality: N/A;   73  seeds implanted no seeds founds in bladder   SCLEROTHERAPY  04/24/2023   Procedure:  SCLEROTHERAPY;  Surgeon: Eartha Angelia, Sieving, MD;  [facility];  Service: Gastroenterology;;   SUBMUCOSAL TATTOO INJECTION  04/24/2023   Procedure: SUBMUCOSAL TATTOO INJECTION;  Surgeon: Eartha Angelia, Sieving, MD;  [facility];  Service: Gastroenterology;;   TRANSESOPHAGEAL ECHOCARDIOGRAM (CATH LAB) N/A 06/17/2024   Procedure: TRANSESOPHAGEAL ECHOCARDIOGRAM;  Surgeon: Kennyth Chew, MD;  Location: [facility] INVASIVE CV LAB;  Service: Cardiovascular;  Laterality: N/A;     FAMILY HISTORY: Family History  Problem Relation Age of Onset   Heart attack Mother    Coronary artery disease Father    Ulcerative colitis Father    Ulcers Father  Breast cancer Sister    ALS Sister    COPD Brother    Pancreatic cancer Brother    Diabetes Brother    Cirrhosis Son    Seizures Son      SOCIAL HISTORY: Social History   Socioeconomic History   Marital status: Married    Spouse name: Not on file   Number of children: 2   Years of education: Not on file   Highest education level: Not on file  Occupational History   Occupation: Retired Personnel officer  Tobacco Use   Smoking status: Former    Current packs/day: 0.00    Average packs/day: 1 pack/day for 30.0 years (30.0 ttl pk-yrs)    Types: Cigarettes    Start date: 05/22/1959    Quit date: 05/21/1989    Years since quitting: 35.1    Passive exposure: Never   Smokeless tobacco: Never  Vaping Use   Vaping status: Never Used  Substance and Sexual Activity   Alcohol  use: Yes    Alcohol /week: 3.0 standard drinks of alcohol     Types: 3 Standard drinks or equivalent per week    Comment: 1 glass of liquor, 3 days a week   Drug use: No   Sexual activity: Not on file  Other Topics Concern   Not on file  Social History Narrative   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      Right handed   Wears readers   Drinks 1 cup in mornings   Drinks ginger ale   Social Drivers of Health   Financial Resource Strain: Low Risk   (11/05/2023)   Overall Financial Resource Strain (CARDIA)    Difficulty of Paying Living Expenses: Not very hard  Food Insecurity: No Food Insecurity (02/18/2024)   Hunger Vital Sign    Worried About Running Out of Food in the Last Year: Never true    Ran Out of Food in the Last Year: Never true  Transportation Needs: No Transportation Needs (02/18/2024)   PRAPARE - Administrator, Civil Service (Medical): No    Lack of Transportation (Non-Medical): No  Physical Activity: Sufficiently Active (11/05/2023)   Exercise Vital Sign    Days of Exercise per Week: 5 days    Minutes of Exercise per Session: 30 min  Stress: No Stress Concern Present (11/05/2023)   [employer] of Occupational Health - Occupational Stress Questionnaire    Feeling of Stress : Not at all  Social Connections: Moderately Integrated (02/14/2024)   Social Connection and Isolation Panel    Frequency of Communication with Friends and Family: More than three times a week    Frequency of Social Gatherings with Friends and Family: Three times a week    Attends Religious Services: More than 4 times per year    Active Member of Clubs or Organizations: No    Attends Banker Meetings: Never    Marital Status: Married  Catering manager Violence: Not At Risk (02/18/2024)   Humiliation, Afraid, Rape, and Kick questionnaire    Fear of Current or Ex-Partner: No    Emotionally Abused: No    Physically Abused: No    Sexually Abused: No     PHYSICAL EXAM  There were no vitals filed for this visit. There is no height or weight on file to calculate BMI.  Generalized: Well developed, in no acute distress  Cardiology: normal rate and rhythm, no murmur auscultated  Respiratory: clear to auscultation bilaterally    Neurological examination  Mentation: Alert  oriented to time, place, history taking. Follows all commands speech and language fluent Cranial nerve II-XII: Pupils were equal round reactive to  light. Extraocular movements were full, visual field were full on confrontational test. Facial sensation and strength were normal. Uvula tongue midline. Head turning and shoulder shrug  were normal and symmetric. Motor: The motor testing reveals 5 over 5 strength of all 4 extremities. Good symmetric motor tone is noted throughout.  Sensory: Sensory testing is intact to soft touch on all 4 extremities. No evidence of extinction is noted.  Coordination: Cerebellar testing reveals good finger-nose-finger and heel-to-shin bilaterally.  Gait and station: Gait is normal. Tandem gait is normal. Romberg is negative. No drift is seen.  Reflexes: Deep tendon reflexes are symmetric and normal bilaterally.    DIAGNOSTIC DATA (LABS, IMAGING, TESTING) - I reviewed patient records, labs, notes, testing and imaging myself where available.  Lab Results  Component Value Date   WBC 6.9 06/17/2024   HGB 10.8 (L) 06/17/2024   HCT 35.1 (L) 06/17/2024   MCV 95.9 06/17/2024   PLT 193 06/17/2024      Component Value Date/Time   NA 132 (L) 06/17/2024 0549   NA 137 06/15/2024 1346   K 3.2 (L) 06/17/2024 0549   CL 92 (L) 06/17/2024 0549   CO2 29 06/17/2024 0549   GLUCOSE 98 06/17/2024 0549   BUN 13 06/17/2024 0549   BUN 21 06/15/2024 1346   CREATININE 3.45 (H) 06/17/2024 0549   CREATININE 4.66 (H) 03/28/2023 1340   CALCIUM  8.7 (L) 06/17/2024 0549   CALCIUM  8.9 05/21/2024 1120   PROT 6.7 06/15/2024 1346   ALBUMIN 3.4 (L) 06/15/2024 1346   AST 14 06/15/2024 1346   ALT 9 06/15/2024 1346   ALKPHOS 132 (H) 06/15/2024 1346   BILITOT 0.3 06/15/2024 1346   GFRNONAA 17 (L) 06/17/2024 0549   GFRNONAA 23 (L) 06/14/2021 1322   GFRAA 27 (L) 06/14/2021 1322   Lab Results  Component Value Date   CHOL 127 06/15/2024   HDL 56 06/15/2024   LDLCALC 52 06/15/2024   TRIG 102 06/15/2024   CHOLHDL 2.3 06/15/2024   Lab Results  Component Value Date   HGBA1C 5.7 02/03/2024   No results found for: VITAMINB12 Lab  Results  Component Value Date   TSH 3.320 06/15/2024       09/14/2021   11:42 AM  MMSE - Mini Mental State Exam  Not completed: Unable to complete         No data to display           ASSESSMENT AND PLAN  78 y.o. year old male  has a past medical history of Acute CVA (cerebrovascular accident) (HCC) (09/11/2023), CAD (coronary artery disease), CKD (chronic kidney disease) stage 4, GFR 15-29 ml/min (HCC), First degree heart block, History of gastric ulcer, History of kidney stones, History of MI (myocardial infarction), Hyperlipidemia, Hypertension, Jaundice, OA (osteoarthritis), Prediabetes, Presence of Watchman left atrial appendage closure device (06/17/2024), Prostate cancer (HCC), Psoriatic arthritis (HCC), RA (rheumatoid arthritis) (HCC), Sinus bradycardia, and Wears dentures. here with    Right-sided nontraumatic intracerebral hemorrhage, unspecified cerebral location [facility])  [name] ***.  Healthy lifestyle habits encouraged. *** will follow up with PCP as directed. *** will return to see me in ***, sooner if needed. *** verbalizes understanding and agreement with this plan.   No orders of the defined types were placed in this encounter.    No orders of the defined types were placed in  this encounter.    Greig Forbes, MSN, FNP-C 07/14/2024, 2:17 PM  [facility] [address] [place], [place] [address] [phone]

## 2024-07-14 NOTE — Patient Instructions (Signed)
 Below is our plan:  Goals:  1) Maintain strict control of hypertension with blood pressure goal below 130/90 2) Maintain good control of diabetes with hemoglobin A1c goal below 7%  3) Maintain good control of lipids with LDL cholesterol goal below 70 mg/dL.  4) Eat a healthy diet with plenty of whole grains, cereals, fruits and vegetables, exercise regularly and maintain ideal body weight   Resources: [email or web address]  Please make sure you are staying well hydrated. I recommend 50-60 ounces daily. Well balanced diet and regular exercise encouraged. Consistent sleep schedule with 6-8 hours recommended.   Please continue follow up with care team as directed.   Follow up with *** in ***  You may receive a survey regarding today's visit. I encourage you to leave honest feed back as I do use this information to improve patient care. Thank you for seeing me today!

## 2024-07-15 ENCOUNTER — Encounter: Payer: Self-pay | Admitting: Family Medicine

## 2024-07-15 ENCOUNTER — Ambulatory Visit: Payer: Medicare HMO | Admitting: Family Medicine

## 2024-07-15 VITALS — BP 130/78 | HR 51 | Ht 70.0 in | Wt 151.6 lb

## 2024-07-15 DIAGNOSIS — I69354 Hemiplegia and hemiparesis following cerebral infarction affecting left non-dominant side: Secondary | ICD-10-CM

## 2024-07-15 DIAGNOSIS — I482 Chronic atrial fibrillation, unspecified: Secondary | ICD-10-CM

## 2024-07-15 DIAGNOSIS — I619 Nontraumatic intracerebral hemorrhage, unspecified: Secondary | ICD-10-CM

## 2024-07-16 DIAGNOSIS — Z992 Dependence on renal dialysis: Secondary | ICD-10-CM | POA: Diagnosis not present

## 2024-07-16 DIAGNOSIS — N186 End stage renal disease: Secondary | ICD-10-CM | POA: Diagnosis not present

## 2024-07-19 ENCOUNTER — Ambulatory Visit (HOSPITAL_COMMUNITY)
Admission: RE | Admit: 2024-07-19 | Discharge: 2024-07-19 | Disposition: A | Discharge status: Home / Self Care | Attending: Vascular Surgery | Admitting: Vascular Surgery

## 2024-07-19 ENCOUNTER — Encounter (HOSPITAL_COMMUNITY): Payer: Self-pay | Admitting: Vascular Surgery

## 2024-07-19 ENCOUNTER — Encounter (HOSPITAL_COMMUNITY)
Admission: RE | Disposition: A | Discharge status: Home / Self Care | Payer: Self-pay | Source: Home / Self Care | Attending: Vascular Surgery

## 2024-07-19 ENCOUNTER — Other Ambulatory Visit: Payer: Self-pay

## 2024-07-19 DIAGNOSIS — Y832 Surgical operation with anastomosis, bypass or graft as the cause of abnormal reaction of the patient, or of later complication, without mention of misadventure at the time of the procedure: Secondary | ICD-10-CM | POA: Insufficient documentation

## 2024-07-19 DIAGNOSIS — Z992 Dependence on renal dialysis: Secondary | ICD-10-CM | POA: Diagnosis not present

## 2024-07-19 DIAGNOSIS — I120 Hypertensive chronic kidney disease with stage 5 chronic kidney disease or end stage renal disease: Secondary | ICD-10-CM | POA: Diagnosis not present

## 2024-07-19 DIAGNOSIS — N186 End stage renal disease: Secondary | ICD-10-CM | POA: Diagnosis not present

## 2024-07-19 DIAGNOSIS — N2581 Secondary hyperparathyroidism of renal origin: Secondary | ICD-10-CM | POA: Diagnosis not present

## 2024-07-19 DIAGNOSIS — Z87891 Personal history of nicotine dependence: Secondary | ICD-10-CM | POA: Insufficient documentation

## 2024-07-19 DIAGNOSIS — T82858A Stenosis of vascular prosthetic devices, implants and grafts, initial encounter: Secondary | ICD-10-CM | POA: Diagnosis not present

## 2024-07-19 HISTORY — PX: VENOUS ANGIOPLASTY: CATH118376

## 2024-07-19 HISTORY — PX: A/V SHUNT INTERVENTION: CATH118220

## 2024-07-19 SURGERY — A/V SHUNT INTERVENTION
Anesthesia: LOCAL | Site: Arm Upper | Laterality: Left

## 2024-07-19 MED ORDER — IODIXANOL 320 MG/ML IV SOLN
INTRAVENOUS | Status: DC | PRN
  Administered 2024-07-19: 25 mL via INTRAVENOUS

## 2024-07-19 MED ORDER — LIDOCAINE HCL (PF) 1 % IJ SOLN
INTRAMUSCULAR | Status: DC | PRN
  Administered 2024-07-19: 2 mL via SUBCUTANEOUS

## 2024-07-19 MED ORDER — LIDOCAINE HCL (PF) 1 % IJ SOLN
INTRAMUSCULAR | Status: AC
  Filled 2024-07-19: qty 30

## 2024-07-19 MED ORDER — HEPARIN (PORCINE) IN NACL 1000-0.9 UT/500ML-% IV SOLN
INTRAVENOUS | Status: DC | PRN
  Administered 2024-07-19: 500 mL

## 2024-07-19 SURGICAL SUPPLY — 9 items
BALLOON MUSTANG 8.0X40 75 (BALLOONS) IMPLANT
KIT ENCORE 26 ADVANTAGE (KITS) IMPLANT
KIT MICROPUNCTURE NIT STIFF (SHEATH) IMPLANT
SHEATH PINNACLE R/O II 6F 4CM (SHEATH) IMPLANT
SHEATH PROBE COVER 6X72 (BAG) IMPLANT
STOPCOCK MORSE 400PSI 3WAY (MISCELLANEOUS) IMPLANT
TRAY PV CATH (CUSTOM PROCEDURE TRAY) ×2 IMPLANT
TUBING CIL FLEX 10 FLL-RA (TUBING) IMPLANT
WIRE BENTSON .035X145CM (WIRE) IMPLANT

## 2024-07-19 NOTE — H&P (Signed)
 [facility] [place]  ASSESSMENT / PLAN: 78 y.o. male with ESRD dialyzing through left arm brachiocephalic. The fistula is not performing well at dialysis. Plan fistulagram today to evaluate.  CHIEF COMPLAINT: poorly functioning fistula  HISTORY OF PRESENT ILLNESS: MINORU CHAP is a 78 y.o. 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 The fistula is not working well at dialysis. The patient has no complaints today other than his fistula.  Past Medical History:  Diagnosis Date   Acute CVA (cerebrovascular accident) (HCC) 09/11/2023   CAD (coronary artery disease)    Acute myocardial infarction treated with TPA in 1990; 1999-stents to circumflex and RCA; residual total occlusion of the left anterior descending; normal ejection fraction; stress nuclear in 2001-distal anteroseptal ischemia; normal LV function   CKD (chronic kidney disease) stage 4, GFR 15-29 ml/min (HCC)    First degree heart block    History of gastric ulcer    History of kidney stones    History of MI (myocardial infarction)    1990-  treated w/ TPA   Hyperlipidemia    Hypertension    Jaundice    OA (osteoarthritis)    Prediabetes    Presence of Watchman left atrial appendage closure device 06/17/2024   27 mm Watchman FLX Pro, Dr. Kennyth   Prostate cancer [facility])    Stage T1c , Gleason 3+4,  PSA 4.7,  vol 24cc--  scheduled for radiative seed implants   Psoriatic arthritis (HCC)    RA (rheumatoid arthritis) (HCC)    Dr. Ronette   Sinus bradycardia    Wears dentures     Past Surgical History:  Procedure Laterality Date   A/V FISTULAGRAM Left 04/20/2024   Procedure: A/V Fistulagram;  Surgeon: Serene Gaile ORN, MD;  Location: [facility] INVASIVE CV LAB;  Service: Cardiovascular;  Laterality: Left;   AV FISTULA PLACEMENT Left 01/27/2024   Procedure: CREATION OF LEFT ARM ARTERIOVENOUS (AV) FISTULA;  Surgeon: Sheree Penne Bruckner, MD;  Location: [facility] OR;  Service: Vascular;  Laterality: Left;   BIOPSY   08/27/2019   Procedure: BIOPSY;  Surgeon: Golda Claudis PENNER, MD;  [facility];  Service: Endoscopy;;  gastric   BIOPSY  04/03/2022   Procedure: BIOPSY;  Surgeon: Eartha Angelia Sieving, MD;  [facility];  Service: Gastroenterology;;   BIOPSY  04/21/2023   Procedure: BIOPSY;  Surgeon: Cindie Carlin POUR, DO;  [facility];  Service: Endoscopy;;   callus removal Left 09/13/2022   left foot   CARDIOVASCULAR STRESS TEST  2001  per Dr Juventino clinic note   distal anteroseptal ischemia,  normal LVF   COLONOSCOPY  last one 2006 (approx)   COLONOSCOPY WITH PROPOFOL  N/A 04/03/2022   Procedure: COLONOSCOPY WITH PROPOFOL ;  Surgeon: Eartha Angelia Sieving, MD;  [facility];  Service: Gastroenterology;  Laterality: N/A;  945   CORONARY ANGIOPLASTY WITH STENT PLACEMENT  1999   DES x2  to CFX and RCA/  residual total occlusion LAD,  normal LVEF   ENTEROSCOPY N/A 04/24/2023   Procedure: ENTEROSCOPY;  Surgeon: Eartha Angelia Sieving, MD;  [facility];  Service: Gastroenterology;  Laterality: N/A;   ESOPHAGOGASTRODUODENOSCOPY (EGD) WITH PROPOFOL  N/A 08/27/2019   Procedure: ESOPHAGOGASTRODUODENOSCOPY (EGD) WITH PROPOFOL ;  Surgeon: Golda Claudis PENNER, MD;  [facility];  Service: Endoscopy;  Laterality: N/A;  10:10   ESOPHAGOGASTRODUODENOSCOPY (EGD) WITH PROPOFOL  N/A 04/03/2022   Procedure: ESOPHAGOGASTRODUODENOSCOPY (EGD) WITH PROPOFOL ;  Surgeon: Eartha Angelia Sieving, MD;  [facility];  Service: Gastroenterology;  Laterality: N/A;   ESOPHAGOGASTRODUODENOSCOPY (EGD) WITH PROPOFOL  N/A 02/16/2023   Procedure: ESOPHAGOGASTRODUODENOSCOPY (EGD) WITH PROPOFOL ;  Surgeon: Eartha Angelia Sieving, MD;  [facility];  Service: Gastroenterology;  Laterality: N/A;   ESOPHAGOGASTRODUODENOSCOPY (EGD) WITH PROPOFOL  N/A 04/21/2023   Procedure: ESOPHAGOGASTRODUODENOSCOPY (EGD) WITH PROPOFOL ;  Surgeon: Cindie Carlin POUR, DO;   [facility];  Service: Endoscopy;  Laterality: N/A;   EYE SURGERY Bilateral    cataract   GIVENS CAPSULE STUDY N/A 04/22/2023   Procedure: GIVENS CAPSULE STUDY;  Surgeon: Eartha Angelia Sieving, MD;  [facility];  Service: Gastroenterology;  Laterality: N/A;   HEMOSTASIS CLIP PLACEMENT  04/24/2023   Procedure: HEMOSTASIS CLIP PLACEMENT;  Surgeon: Eartha Angelia Sieving, MD;  [facility];  Service: Gastroenterology;;   HOT HEMOSTASIS  04/03/2022   Procedure: HOT HEMOSTASIS (ARGON PLASMA COAGULATION/BICAP);  Surgeon: Eartha Angelia, Sieving, MD;  [facility];  Service: Gastroenterology;;   HOT HEMOSTASIS  04/24/2023   Procedure: HOT HEMOSTASIS (ARGON PLASMA COAGULATION/BICAP);  Surgeon: Eartha Angelia, Sieving, MD;  [facility];  Service: Gastroenterology;;   IR FLUORO GUIDE CV LINE RIGHT  05/30/2023   IR REMOVAL TUN CV CATH W/O FL  06/01/2024   IR US  GUIDE VASC ACCESS RIGHT  05/30/2023   LEFT ATRIAL APPENDAGE OCCLUSION N/A 06/17/2024   Procedure: LEFT ATRIAL APPENDAGE OCCLUSION;  Surgeon: Kennyth Chew, MD;  Location: [facility] INVASIVE CV LAB;  Service: Cardiovascular;  Laterality: N/A;   LIGATION OF ARTERIOVENOUS  FISTULA Left 04/20/2024   Procedure: BRANCH LIGATION OF LEFT ARM ARTERIOVENOUS FISTULA;  Surgeon: Pearline Norman RAMAN, MD;  Location: [facility] OR;  Service: Vascular;  Laterality: Left;  BRANCH LIGATION LEFT FISTULA  ARM   POLYPECTOMY  04/03/2022   Procedure: POLYPECTOMY;  Surgeon: Eartha Angelia Sieving, MD;  [facility];  Service: Gastroenterology;;   POSTERIOR CERVICAL FUSION/FORAMINOTOMY N/A 05/26/2017   Procedure: RIGHT C4-5 FORAMINOTOMY WITH EXCISION OF HERNIATED DISC;  Surgeon: Lucilla Lynwood BRAVO, MD;  Location: [facility] OR;  Service: Orthopedics;  Laterality: N/A;   RADIOACTIVE SEED IMPLANT N/A 01/11/2016   Procedure: RADIOACTIVE SEED IMPLANT/BRACHYTHERAPY IMPLANT;  Surgeon: Garnette Shack, MD;  Location: [facility];  Service: Urology;  Laterality: N/A;   73  seeds implanted no seeds founds in bladder   SCLEROTHERAPY  04/24/2023   Procedure: SCLEROTHERAPY;  Surgeon: Eartha Angelia, Sieving, MD;  [facility];  Service: Gastroenterology;;   SUBMUCOSAL TATTOO INJECTION  04/24/2023   Procedure: SUBMUCOSAL TATTOO INJECTION;  Surgeon: Eartha Angelia, Sieving, MD;  [facility];  Service: Gastroenterology;;   TRANSESOPHAGEAL ECHOCARDIOGRAM (CATH LAB) N/A 06/17/2024   Procedure: TRANSESOPHAGEAL ECHOCARDIOGRAM;  Surgeon: Kennyth Chew, MD;  Location: [facility] INVASIVE CV LAB;  Service: Cardiovascular;  Laterality: N/A;    Family History  Problem Relation Age of Onset   Heart attack Mother    Coronary artery disease Father    Ulcerative colitis Father    Ulcers Father    Breast cancer Sister    ALS Sister    COPD Brother    Pancreatic cancer Brother    Diabetes Brother    Cirrhosis Son    Seizures Son     Social History   Socioeconomic History   Marital status: Married    Spouse name: Not on file   Number of children: 2   Years of education: Not on file   Highest education level: Not on file  Occupational  History   Occupation: Retired Personnel officer  Tobacco Use   Smoking status: Former    Current packs/day: 0.00    Average packs/day: 1 pack/day for 30.0 years (30.0 ttl pk-yrs)    Types: Cigarettes    Start date: 05/22/1959    Quit date: 05/21/1989    Years since quitting: 35.1    Passive exposure: Never   Smokeless tobacco: Never  Vaping Use   Vaping status: Never Used  Substance and Sexual Activity   Alcohol  use: Yes    Alcohol /week: 3.0 standard drinks of alcohol     Types: 3 Standard drinks or equivalent per week    Comment: 1 glass of liquor, 3 days a week   Drug use: No   Sexual activity: Not on file  Other Topics Concern   Not on file  Social History Narrative   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      Right handed   Wears readers   Drinks 1 cup in  mornings   Drinks ginger ale   Social Drivers of Health   Financial Resource Strain: Low Risk  (11/05/2023)   Overall Financial Resource Strain (CARDIA)    Difficulty of Paying Living Expenses: Not very hard  Food Insecurity: No Food Insecurity (02/18/2024)   Hunger Vital Sign    Worried About Running Out of Food in the Last Year: Never true    Ran Out of Food in the Last Year: Never true  Transportation Needs: No Transportation Needs (02/18/2024)   PRAPARE - Administrator, Civil Service (Medical): No    Lack of Transportation (Non-Medical): No  Physical Activity: Sufficiently Active (11/05/2023)   Exercise Vital Sign    Days of Exercise per Week: 5 days    Minutes of Exercise per Session: 30 min  Stress: No Stress Concern Present (11/05/2023)   [employer] of Occupational Health - Occupational Stress Questionnaire    Feeling of Stress : Not at all  Social Connections: Moderately Integrated (02/14/2024)   Social Connection and Isolation Panel    Frequency of Communication with Friends and Family: More than three times a week    Frequency of Social Gatherings with Friends and Family: Three times a week    Attends Religious Services: More than 4 times per year    Active Member of Clubs or Organizations: No    Attends Banker Meetings: Never    Marital Status: Married  Catering manager Violence: Not At Risk (02/18/2024)   Humiliation, Afraid, Rape, and Kick questionnaire    Fear of Current or Ex-Partner: No    Emotionally Abused: No    Physically Abused: No    Sexually Abused: No    No Known Allergies  No current facility-administered medications for this encounter.    PHYSICAL EXAM Vitals:   07/19/24 0732 07/19/24 0734  BP:  (!) 169/71  Pulse: 68 69  SpO2: 100% 99%   Elderly man in no distress Regular rate and rhythm Unlabored breathing Fistula with strong thrill near elbow  PERTINENT LABORATORY AND RADIOLOGIC DATA  Most recent CBC     Latest Ref Rng & Units 06/17/2024    6:28 AM 06/15/2024    1:46 PM 05/20/2024   11:11 AM  CBC  WBC 4.0 - 10.5 K/uL 6.9  5.5  6.5   Hemoglobin 13.0 - 17.0 g/dL 89.1  89.8  89.5   Hematocrit 39.0 - 52.0 % 35.1  32.5  33.0   Platelets 150 - 400 K/uL 193  211  251      Most recent CMP    Latest Ref Rng & Units 06/17/2024    5:49 AM 06/15/2024    1:46 PM 05/21/2024   11:20 AM  CMP  Glucose 70 - 99 mg/dL 98  879    BUN 8 - 23 mg/dL 13  21    Creatinine 9.38 - 1.24 mg/dL 6.54  5.79    Sodium 864 - 145 mmol/L 132  137    Potassium 3.5 - 5.1 mmol/L 3.2  3.3    Chloride 98 - 111 mmol/L 92  94    CO2 22 - 32 mmol/L 29  27    Calcium  8.9 - 10.3 mg/dL 8.7  8.9  8.9      Total Protein 6.0 - 8.5 g/dL  6.7    Total Bilirubin 0.0 - 1.2 mg/dL  0.3    Alkaline Phos 44 - 121 IU/L  132    AST 0 - 40 IU/L  14    ALT 0 - 44 IU/L  9       This result is from an external source.    Renal function CrCl cannot be calculated (Patient's most recent lab result is older than the maximum 21 days allowed.).  Hemoglobin A1C (no units)  Date Value  02/03/2024 5.7    LDL Cholesterol (Calc)  Date Value Ref Range Status  01/31/2023 42 mg/dL (calc) Final    Comment:    Reference range: <100 . Desirable range <100 mg/dL for primary prevention;   <70 mg/dL for patients with CHD or diabetic patients  with > or = 2 CHD risk factors. [facility] LDL-C is now calculated using the Martin-Hopkins  calculation, which is a validated novel method providing  better accuracy than the Friedewald equation in the  estimation of LDL-C.  Gladis APPLETHWAITE et al. SANDREA. 7986;689(80): 2061-2068  (http://education.[employer].com/faq/FAQ164)    LDL Chol Calc (NIH)  Date Value Ref Range Status  06/15/2024 52 0 - 99 mg/dL Final    Debby SAILOR. Magda, MD FACS [facility] Phone Number: [phone] 07/19/2024 8:30 AM   Total time spent on preparing this encounter including chart review, data  review, collecting history, examining the patient, and coordinating care: 30 min  Portions of this report may have been transcribed using voice recognition software.  Every effort has been made to ensure accuracy; however, inadvertent computerized transcription errors may still be present.

## 2024-07-19 NOTE — Op Note (Signed)
 DATE OF SERVICE: 07/19/2024  PATIENT:  Jacob Farmer  78 y.o. male  PRE-OPERATIVE DIAGNOSIS:  end-stage renal disease; infiltrated AVF  POST-OPERATIVE DIAGNOSIS:  Same  PROCEDURE:   1) Ultrasound guided left arm access (CPT [phone]) 2) fistulagram with peripheral angioplasty (CPT [phone]) 3) established outpatient evaluation and management - level 3 (CPT 99213)  SURGEON:  Debby SAILOR. Magda, MD  ASSISTANT: none  ANESTHESIA:   local and IV sedation  ESTIMATED BLOOD LOSS: min  LOCAL MEDICATIONS USED:  LIDOCAINE    COUNTS: confirmed correct.  PATIENT DISPOSITION:  PACU - hemodynamically stable.   Delay start of Pharmacological VTE agent (>24hrs) due to surgical blood loss or risk of bleeding: no  INDICATION FOR PROCEDURE: Jacob Farmer is a 78 y.o. male with ESRD on HD via left arm BC AVF. The patient suffered an infiltration on Friday 07/16/24, and had to abort his dialysis. After careful discussion of risks, benefits, and alternatives the patient was offered fistulagram. The patient understood and wished to proceed.  OPERATIVE FINDINGS:  Left Upper Extremity Central venous: no stenosis Subclavian vein: no stenosis Cephalic Arch: no stenosis Fistula: 60% peripheral fistula stenosis; no other significant stenosis Anastomosis: no stenosis  DESCRIPTION OF PROCEDURE: After identification of the patient in the pre-operative holding area, the patient was transferred to the operating room. The patient was positioned supine on the operating room table.  The left upper extremity was prepped and draped in standard fashion. A surgical pause was performed confirming correct patient, procedure, and operative location.  The left upper extremity was anesthetized with subcutaneous injection of 1% lidocaine  over the area of planned access. Using ultrasound guidance, the left arm dialysis access was accessed with micropuncture technique.  Fistulogram was performed in stations with the micro sheath.  See  above for details.  The decision was made to intervene. Angioplasty was performed of the peripheral fistula with a 8x30mm Mustang balloon. Resolution of stenosis was noted. Improved thrill was noted in the fistula.  All endovascular equipment was removed.  A figure-of-eight stitch was applied to the exit site with good hemostasis.  Sterile bandage was applied.  Upon completion of the case instrument and sharps counts were confirmed correct. The patient was transferred to the PACU in good condition. I was present for all portions of the procedure.  PLAN: OK to use fistula between marked areas. Use small needles. Fistula remains amenable to percutaneous intervention.   Debby SAILOR. Magda, MD [facility] Phone Number: [phone] 07/19/2024 9:22 AM

## 2024-07-20 ENCOUNTER — Ambulatory Visit: Admitting: Pulmonary Disease

## 2024-07-21 ENCOUNTER — Other Ambulatory Visit: Payer: Self-pay | Admitting: Student

## 2024-07-21 DIAGNOSIS — N2581 Secondary hyperparathyroidism of renal origin: Secondary | ICD-10-CM | POA: Diagnosis not present

## 2024-07-21 DIAGNOSIS — N186 End stage renal disease: Secondary | ICD-10-CM | POA: Diagnosis not present

## 2024-07-21 DIAGNOSIS — Z992 Dependence on renal dialysis: Secondary | ICD-10-CM | POA: Diagnosis not present

## 2024-07-22 ENCOUNTER — Ambulatory Visit (INDEPENDENT_AMBULATORY_CARE_PROVIDER_SITE_OTHER): Admitting: Podiatry

## 2024-07-22 VITALS — Ht 70.0 in | Wt 151.6 lb

## 2024-07-22 DIAGNOSIS — L97512 Non-pressure chronic ulcer of other part of right foot with fat layer exposed: Secondary | ICD-10-CM | POA: Diagnosis not present

## 2024-07-22 MED ORDER — GENTAMICIN SULFATE 0.1 % EX OINT
1.0000 | TOPICAL_OINTMENT | Freq: Every day | CUTANEOUS | 0 refills | Status: DC
Start: 2024-07-22 — End: 2024-08-12

## 2024-07-22 NOTE — Patient Instructions (Signed)
 Call to schedule your vascular testing:   [facility] Jeralene Mom. [facility] [address] [place],  [place]  [address]   Main: [phone]

## 2024-07-23 DIAGNOSIS — Z992 Dependence on renal dialysis: Secondary | ICD-10-CM | POA: Diagnosis not present

## 2024-07-23 DIAGNOSIS — N186 End stage renal disease: Secondary | ICD-10-CM | POA: Diagnosis not present

## 2024-07-23 DIAGNOSIS — N2581 Secondary hyperparathyroidism of renal origin: Secondary | ICD-10-CM | POA: Diagnosis not present

## 2024-07-25 NOTE — Progress Notes (Signed)
  Subjective:  Patient ID: Jacob Farmer, male    DOB: June 14, 1946,  MRN: [id number]  Chief Complaint  Patient presents with   Foot Ulcer    Rm 20 Patient is here right foot ulcer ( located by the heel). Ulcer is open, dry edges and no drainage. Patient states no pain or discomfort.    78 y.o. male presents with the above complaint. History confirmed with patient.   Objective:  Physical Exam: Foot is relatively warm pulses are nonpalpable he has a full-thickness ulcer on the plantar medial heel measuring 0.7 x 0.4 x 0.3 cm.     Assessment:   1. Ulcer of right foot with fat layer exposed (HCC)      Plan:  Patient was evaluated and treated and all questions answered.  Ulcer right heel -We discussed the etiology and factors that are a part of the wound healing process.  We also discussed the risk of infection both soft tissue and osteomyelitis from open ulceration.  Discussed the risk of limb loss if this happens or worsens. -Debridement as below. -Dressed with antibiotic ointment, DSD. -Continue home dressing changes daily with bandaid-type dressing and gentamicin  ointment  -Vascular testing recommended noninvasive testing, order placed for ABI and they will call to schedule -HgbA1c: Nondiabetic -Last antibiotics:    Procedure: Excisional Debridement of Wound Rationale: Removal of non-viable soft tissue from the wound to promote healing.  Anesthesia: none Post-Debridement Wound Measurements: Noted above Type of Debridement: Sharp Excisional Tissue Removed: Non-viable soft tissue Depth of Debridement: subcutaneous tissue. Technique: Sharp excisional debridement to bleeding, viable wound base.  Dressing: Dry, sterile, compression dressing. Disposition: Patient tolerated procedure well.       Return in about 3 weeks (around 08/12/2024) for wound care.

## 2024-07-26 DIAGNOSIS — Z992 Dependence on renal dialysis: Secondary | ICD-10-CM | POA: Diagnosis not present

## 2024-07-26 DIAGNOSIS — N2581 Secondary hyperparathyroidism of renal origin: Secondary | ICD-10-CM | POA: Diagnosis not present

## 2024-07-26 DIAGNOSIS — N186 End stage renal disease: Secondary | ICD-10-CM | POA: Diagnosis not present

## 2024-07-28 ENCOUNTER — Telehealth: Payer: Self-pay | Admitting: Rheumatology

## 2024-07-28 DIAGNOSIS — N186 End stage renal disease: Secondary | ICD-10-CM | POA: Diagnosis not present

## 2024-07-28 DIAGNOSIS — Z992 Dependence on renal dialysis: Secondary | ICD-10-CM | POA: Diagnosis not present

## 2024-07-28 DIAGNOSIS — N2581 Secondary hyperparathyroidism of renal origin: Secondary | ICD-10-CM | POA: Diagnosis not present

## 2024-07-28 NOTE — Telephone Encounter (Signed)
 Patient had labs in July 2025. Reached out to patient's wife and advised patient does not need labs until October 2025.

## 2024-07-28 NOTE — Telephone Encounter (Signed)
 Patient contacted the office requesting lab orders to be be release to [facility] in [place].   Patient plans to have labs on 07/29/24.

## 2024-07-30 DIAGNOSIS — N186 End stage renal disease: Secondary | ICD-10-CM | POA: Diagnosis not present

## 2024-07-30 DIAGNOSIS — N2581 Secondary hyperparathyroidism of renal origin: Secondary | ICD-10-CM | POA: Diagnosis not present

## 2024-07-30 DIAGNOSIS — Z992 Dependence on renal dialysis: Secondary | ICD-10-CM | POA: Diagnosis not present

## 2024-08-02 ENCOUNTER — Ambulatory Visit: Admitting: Student

## 2024-08-02 DIAGNOSIS — Z992 Dependence on renal dialysis: Secondary | ICD-10-CM | POA: Diagnosis not present

## 2024-08-02 DIAGNOSIS — N2581 Secondary hyperparathyroidism of renal origin: Secondary | ICD-10-CM | POA: Diagnosis not present

## 2024-08-02 DIAGNOSIS — N186 End stage renal disease: Secondary | ICD-10-CM | POA: Diagnosis not present

## 2024-08-03 ENCOUNTER — Ambulatory Visit
Admission: RE | Admit: 2024-08-03 | Discharge: 2024-08-03 | Disposition: A | Discharge status: Home / Self Care | Source: Ambulatory Visit | Attending: Podiatry | Admitting: Podiatry

## 2024-08-03 ENCOUNTER — Ambulatory Visit: Payer: Self-pay | Admitting: Podiatry

## 2024-08-03 ENCOUNTER — Encounter: Payer: Self-pay | Admitting: Student

## 2024-08-03 ENCOUNTER — Ambulatory Visit (INDEPENDENT_AMBULATORY_CARE_PROVIDER_SITE_OTHER): Payer: Self-pay | Admitting: Student

## 2024-08-03 VITALS — BP 120/60 | HR 74 | Ht 70.0 in | Wt 149.0 lb

## 2024-08-03 DIAGNOSIS — L97512 Non-pressure chronic ulcer of other part of right foot with fat layer exposed: Secondary | ICD-10-CM | POA: Insufficient documentation

## 2024-08-03 DIAGNOSIS — I2510 Atherosclerotic heart disease of native coronary artery without angina pectoris: Secondary | ICD-10-CM | POA: Diagnosis not present

## 2024-08-03 DIAGNOSIS — I480 Paroxysmal atrial fibrillation: Secondary | ICD-10-CM

## 2024-08-03 DIAGNOSIS — Z95818 Presence of other cardiac implants and grafts: Secondary | ICD-10-CM

## 2024-08-03 MED ORDER — CLOPIDOGREL BISULFATE 75 MG PO TABS: 75.0000 mg | ORAL_TABLET | Freq: Every day | ORAL | 3 refills | Status: DC

## 2024-08-03 NOTE — Progress Notes (Signed)
  Electrophysiology Office Note:   Date:  08/03/2024  ID:  Jacob Farmer, DOB 06-12-1946, MRN [id number]  Primary Cardiologist: Alvan Carrier, MD Electrophysiologist: Fonda Kitty, MD      History of Present Illness:   Jacob Farmer is a 78 y.o. male with h/o coronary disease, paroxysmal atrial fibrillation, tachybradycardia syndrome, HFpEF, type 2 diabetes, essential hypertension with bouts of orthostatic hypotension, rheumatoid and osteoarthritis, end-stage renal disease, prostate cancer, GI bleeding and stroke seen today for routine electrophysiology follow-up s/p Watchman implantation.  Since last being seen in our clinic the patient reports doing OK. He states the Friday after his Watchman he had SECONDs worth of weakness in his left arm. Otherwise, he has been doing OK.   Review of systems complete and found to be negative unless listed in HPI.   EP Information / Studies Reviewed:    EKG is not ordered today. EKG from 05/20/2024 reviewed which showed NSR at 75 with PACs       Arrhythmia/Device History No specialty comments available.   Physical Exam:   VS:  BP 120/60 (BP Location: Right Arm, Patient Position: Sitting, Cuff Size: Normal)   Pulse 74   Ht 5' 10 (1.778 m)   Wt 149 lb (67.6 kg)   BMI 21.38 kg/m    Wt Readings from Last 3 Encounters:  08/03/24 149 lb (67.6 kg)  07/22/24 151 lb 9.6 oz (68.8 kg)  07/15/24 151 lb 9.6 oz (68.8 kg)     GEN: No acute distress NECK: No JVD; No carotid bruits CARDIAC: Regular rate and rhythm, no murmurs, rubs, gallops RESPIRATORY:  Clear to auscultation without rales, wheezing or rhonchi  ABDOMEN: Soft, non-tender, non-distended EXTREMITIES:  No edema; No deformity   ASSESSMENT AND PLAN:    Atrial fibrillation Secondary hypercoagulable state due to AF S/p Watchman 06/17/2024 CT planned for 9/18. Labs today (known ESRD) Now > 45 days out transition from Eliquis  to Plavix  75 mg daily and 81 mg ASA to continue indefinitely in  setting of extensive aortic atheromatous disease. Pt has dentures; Understands he would need prophylactic antibiotics for dental procedures  H/o GI bleeding H/o CVA Stopping Eliquis  as above To continue Plavix  and ASA indefinitely for now.   ESRD Per Nephrology.    Follow up with EP Team in January for 6 month post op check.   Signed, Ozell Prentice Passey, PA-C

## 2024-08-03 NOTE — Patient Instructions (Signed)
 Medication Instructions:  1.Stop eliquis , take last dose this evening 2.Start plavix  tomorrow 08/04/24 *If you need a refill on your cardiac medications before your next appointment, please call your pharmacy*  Lab Work: None ordered If you have labs (blood work) drawn today and your tests are completely normal, you will receive your results only by: MyChart Message (if you have MyChart) OR A paper copy in the mail If you have any lab test that is abnormal or we need to change your treatment, we will call you to review the results.  Testing/Procedures: See letter  Follow-Up: At [facility], you and your health needs are our priority.  As part of our continuing mission to provide you with exceptional heart care, our providers are all part of one team.  This team includes your primary Cardiologist (physician) and Advanced Practice Providers or APPs (Physician Assistants and Nurse Practitioners) who all work together to provide you with the care you need, when you need it.  Your next appointment:   January 2026  Provider:   You may see Fonda Kitty, MD or one of the following Advanced Practice Providers on your designated Care Team:   Charlies Arthur, PA-C Michael Andy Tillery, PA-C Suzann Riddle, NP Daphne Barrack, NP

## 2024-08-04 DIAGNOSIS — Z992 Dependence on renal dialysis: Secondary | ICD-10-CM | POA: Diagnosis not present

## 2024-08-04 DIAGNOSIS — N2581 Secondary hyperparathyroidism of renal origin: Secondary | ICD-10-CM | POA: Diagnosis not present

## 2024-08-04 DIAGNOSIS — N186 End stage renal disease: Secondary | ICD-10-CM | POA: Diagnosis not present

## 2024-08-04 LAB — VAS US PAD ABI
Left ABI: 1.16
Right ABI: 0.86

## 2024-08-05 ENCOUNTER — Ambulatory Visit: Admitting: Internal Medicine

## 2024-08-06 DIAGNOSIS — N186 End stage renal disease: Secondary | ICD-10-CM | POA: Diagnosis not present

## 2024-08-06 DIAGNOSIS — Z992 Dependence on renal dialysis: Secondary | ICD-10-CM | POA: Diagnosis not present

## 2024-08-06 DIAGNOSIS — N2581 Secondary hyperparathyroidism of renal origin: Secondary | ICD-10-CM | POA: Diagnosis not present

## 2024-08-09 DIAGNOSIS — N2581 Secondary hyperparathyroidism of renal origin: Secondary | ICD-10-CM | POA: Diagnosis not present

## 2024-08-09 DIAGNOSIS — N186 End stage renal disease: Secondary | ICD-10-CM | POA: Diagnosis not present

## 2024-08-09 DIAGNOSIS — Z992 Dependence on renal dialysis: Secondary | ICD-10-CM | POA: Diagnosis not present

## 2024-08-11 DIAGNOSIS — Z992 Dependence on renal dialysis: Secondary | ICD-10-CM | POA: Diagnosis not present

## 2024-08-11 DIAGNOSIS — N186 End stage renal disease: Secondary | ICD-10-CM | POA: Diagnosis not present

## 2024-08-11 DIAGNOSIS — N2581 Secondary hyperparathyroidism of renal origin: Secondary | ICD-10-CM | POA: Diagnosis not present

## 2024-08-12 ENCOUNTER — Encounter (INDEPENDENT_AMBULATORY_CARE_PROVIDER_SITE_OTHER): Payer: Self-pay | Admitting: Gastroenterology

## 2024-08-12 ENCOUNTER — Ambulatory Visit (INDEPENDENT_AMBULATORY_CARE_PROVIDER_SITE_OTHER): Admitting: Gastroenterology

## 2024-08-12 VITALS — BP 158/73 | HR 66 | Temp 98.3°F | Ht 70.0 in | Wt 151.0 lb

## 2024-08-12 DIAGNOSIS — K289 Gastrojejunal ulcer, unspecified as acute or chronic, without hemorrhage or perforation: Secondary | ICD-10-CM | POA: Diagnosis not present

## 2024-08-12 NOTE — Patient Instructions (Signed)
 Decrease pantoprazole  to 40 mg every day Check CBC and iron  stores in 3 months

## 2024-08-12 NOTE — Progress Notes (Unsigned)
 Jacob Farmer, M.D. Gastroenterology & Hepatology [facility] Gastroenterology [address] [place], [place] [address]  Primary Care Physician: Tobie Suzzane POUR, MD [address] [place] [address]  I will communicate my assessment and recommendations to the referring MD via EMR.  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  The patient was last seen on 08/11/2019 for. At that time, the patient was continued on pantoprazole  40 mg every 12 hours.  Denies having any complaints. Still on pantoprazole  40 mg BID. The patient denies having any nausea, vomiting, fever, chills, hematochezia, melena, hematemesis, abdominal distention, abdominal pain, diarrhea, jaundice, pruritus or weight loss.  Patient had a Watchman placed in July 3rd 2025. He has stopped Eliquis  and was switched to Plavix  for now.  Most recent labs from 06/17/2024 showed a hemoglobin of 10.8, WBC 6.9, platelets 193, BMP with potassium 3.2, sodium 132, creatinine 3.45 and calcium  of 8.7.  Last EGD: 04/2023 normal esophagus with healed esophageal ulcer, presence of gastritis and erythematous duodenopathy. Biopsies from the stomach showed reactive gastropathy negative for H. pylori.    Capsule endoscopy next day which showed a few small AVMs in the small bowel with wisps of bright red blood but no active bleeding.    Enteroscopy on  04/24/2023 showed 1 oozing ulcer in the jejunum with very slow oozing. This was clipped x 2., There was another ulcer in the proximal jejunum measuring 10 mm in size, clipped for marking purposes. A single AVM without bleeding was found in the proximal jejunum which was ablated.    Last Colonoscopy: 03/2022  Two colonic angiodysplastic lesions. Treated with argon plasma coagulation (APC).  - Three 3 to 6 mm polyps in the ascending colon and in the cecum, removed with a cold snare. Resected and retrieved. - The distal rectum and anal verge are normal on retroflexion view No repeat colonoscopy given patient's age  Past Medical History: Past Medical History:  Diagnosis Date   Acute CVA (cerebrovascular accident) (HCC) 09/11/2023   CAD (coronary artery disease)    Acute myocardial infarction treated with TPA in 1990; 1999-stents to circumflex and RCA; residual total occlusion of the left anterior descending; normal ejection fraction; stress nuclear in 2001-distal anteroseptal ischemia; normal LV function   CKD (chronic kidney disease) stage 4, GFR 15-29 ml/min (HCC)    First degree heart block    History of gastric ulcer    History of kidney stones    History of MI (myocardial infarction)    1990-  treated w/ TPA   Hyperlipidemia    Hypertension    Jaundice    OA (osteoarthritis)    Prediabetes    Presence of Watchman left atrial appendage closure device 06/17/2024   27 mm Watchman FLX Pro, Dr. Kennyth   Prostate cancer [facility])    Stage T1c , Gleason 3+4,  PSA 4.7,  vol 24cc--  scheduled for radiative seed implants   Psoriatic arthritis (HCC)    RA (rheumatoid  arthritis) (HCC)    Dr. Ronette   Sinus bradycardia    Wears dentures     Past Surgical History: Past Surgical History:  Procedure Laterality Date   A/V FISTULAGRAM Left 04/20/2024   Procedure: A/V Fistulagram;  Surgeon: Serene Gaile ORN, MD;  [facility]: [facility] INV[facility] CV LAB;  Service: Cardiovascular;  Laterality: Left;   A/V SHUNT  INTERVENTION Left 07/19/2024   Procedure: A/V SHUNT INTERVENTION;  Surgeon: Magda Debby SAILOR, MD;  Location: [facility] PV [facility];  Service: Cardiovascular;  Laterality: Left;   AV FISTULA PLACEMENT Left 01/27/2024   Procedure: CREATION OF LEFT ARM ARTERIOVENOUS (AV) FISTULA;  Surgeon: Sheree Penne Bruckner, MD;  Location: [facility] OR;  Service: Vascular;  Laterality: Left;   BIOPSY  08/27/2019   Procedure: BIOPSY;  Surgeon: Golda Claudis PENNER, MD;  [facility]: [facility];  Service: Endoscopy;;  gastric   BIOPSY  04/03/2022   Procedure: BIOPSY;  Surgeon: Eartha Angelia Sieving, MD;  [facility]: [facility];  Service: Gastroenterology;;   BIOPSY  04/21/2023   Procedure: BIOPSY;  Surgeon: Cindie Carlin POUR, DO;  [facility];  Service: Endoscopy;;   callus removal Left 09/13/2022   left foot   CARDIOVASCULAR STRESS TEST  2001  per Dr Juventino clinic note   distal anteroseptal ischemia,  normal LVF   COLONOSCOPY  last one 2006 (approx)   COLONOSCOPY WITH PROPOFOL  N/A 04/03/2022   Procedure: COLONOSCOPY WITH PROPOFOL ;  Surgeon: Eartha Angelia Sieving, MD;  [facility]: [facility];  Service: Gastroenterology;  Laterality: N/A;  945   CORONARY ANGIOPLASTY WITH STENT PLACEMENT  1999   DES x2  to CFX and RCA/  residual total occlusion LAD,  normal LVEF   ENTEROSCOPY N/A 04/24/2023   Procedure: ENTEROSCOPY;  Surgeon: Eartha Angelia Sieving, MD;  [facility];  Service: Gastroenterology;  Laterality: N/A;   ESOPHAGOGASTRODUODENOSCOPY (EGD) WITH PROPOFOL  N/A 08/27/2019   Procedure: ESOPHAGOGASTRODUODENOSCOPY (EGD) WITH PROPOFOL ;  Surgeon: Golda Claudis PENNER, MD;  [facility];  Service: Endoscopy;  Laterality: N/A;  10:10   ESOPHAGOGASTRODUODENOSCOPY (EGD) WITH PROPOFOL  N/A 04/03/2022   Procedure: ESOPHAGOGASTRODUODENOSCOPY (EGD) WITH PROPOFOL ;  Surgeon: Eartha Angelia Sieving, MD;  [facility];  Service: Gastroenterology;  Laterality: N/A;    ESOPHAGOGASTRODUODENOSCOPY (EGD) WITH PROPOFOL  N/A 02/16/2023   Procedure: ESOPHAGOGASTRODUODENOSCOPY (EGD) WITH PROPOFOL ;  Surgeon: Eartha Angelia Sieving, MD;  [facility];  Service: Gastroenterology;  Laterality: N/A;   ESOPHAGOGASTRODUODENOSCOPY (EGD) WITH PROPOFOL  N/A 04/21/2023   Procedure: ESOPHAGOGASTRODUODENOSCOPY (EGD) WITH PROPOFOL ;  Surgeon: Cindie Carlin POUR, DO;  [facility];  Service: Endoscopy;  Laterality: N/A;   EYE SURGERY Bilateral    cataract   GIVENS CAPSULE STUDY N/A 04/22/2023   Procedure: GIVENS CAPSULE STUDY;  Surgeon: Eartha Angelia Sieving, MD;  [facility];  Service: Gastroenterology;  Laterality: N/A;   HEMOSTASIS CLIP PLACEMENT  04/24/2023   Procedure: HEMOSTASIS CLIP PLACEMENT;  Surgeon: Eartha Angelia Sieving, MD;  [facility];  Service: Gastroenterology;;   HOT HEMOSTASIS  04/03/2022   Procedure: HOT HEMOSTASIS (ARGON PLASMA COAGULATION/BICAP);  Surgeon: Eartha Angelia, Sieving, MD;  [facility];  Service: Gastroenterology;;   HOT HEMOSTASIS  04/24/2023   Procedure: HOT HEMOSTASIS (ARGON PLASMA COAGULATION/BICAP);  Surgeon: Eartha Angelia, Sieving, MD;  [facility];  Service: Gastroenterology;;   IR FLUORO GUIDE CV LINE RIGHT  05/30/2023   IR REMOVAL TUN CV CATH W/O FL  06/01/2024   IR US  GUIDE VASC ACCESS RIGHT  05/30/2023   LEFT ATRIAL APPENDAGE OCCLUSION N/A 06/17/2024   Procedure: LEFT ATRIAL APPENDAGE OCCLUSION;  Surgeon: Kennyth Chew, MD;  Location: [facility] INVASIVE CV LAB;  Service: Cardiovascular;  Laterality: N/A;   LIGATION OF ARTERIOVENOUS  FISTULA Left 04/20/2024   Procedure: BRANCH LIGATION OF LEFT ARM ARTERIOVENOUS FISTULA;  Surgeon: Pearline Norman RAMAN, MD;  Location: [facility] OR;  Service: Vascular;  Laterality: Left;  BRANCH LIGATION LEFT FISTULA  ARM   POLYPECTOMY  04/03/2022   Procedure: POLYPECTOMY;  Surgeon: Eartha Angelia Sieving, MD;  [facility];  Service:  Gastroenterology;;   POSTERIOR CERVICAL FUSION/FORAMINOTOMY N/A 05/26/2017   Procedure: RIGHT C4-5 FORAMINOTOMY WITH EXCISION OF HERNIATED DISC;  Surgeon: Lucilla Lynwood BRAVO, MD;  Location: [facility] OR;  Service: Orthopedics;  Laterality: N/A;   RADIOACTIVE SEED IMPLANT N/A 01/11/2016   Procedure: RADIOACTIVE SEED IMPLANT/BRACHYTHERAPY IMPLANT;  Surgeon: Garnette Shack, MD;  Location: [facility];  Service: Urology;  Laterality: N/A;   73  seeds implanted no seeds founds in bladder   SCLEROTHERAPY  04/24/2023   Procedure: SCLEROTHERAPY;  Surgeon: Eartha Angelia, Sieving, MD;  [facility];  Service: Gastroenterology;;   SUBMUCOSAL TATTOO INJECTION  04/24/2023   Procedure: SUBMUCOSAL TATTOO INJECTION;  Surgeon: Eartha Angelia, Sieving, MD;  [facility];  Service: Gastroenterology;;   TRANSESOPHAGEAL ECHOCARDIOGRAM (CATH LAB) N/A 06/17/2024   Procedure: TRANSESOPHAGEAL ECHOCARDIOGRAM;  Surgeon: Kennyth Chew, MD;  Location: [facility] INVASIVE CV LAB;  Service: Cardiovascular;  Laterality: N/A;   VENOUS ANGIOPLASTY  07/19/2024   Procedure: VENOUS ANGIOPLASTY;  Surgeon: Magda Debby SAILOR, MD;  Location: [facility];  Service: Cardiovascular;;  Cephalic Vein    Family History: Family History  Problem Relation Age of Onset   Heart attack Mother    Coronary artery disease Father    Ulcerative colitis Father    Ulcers Father    Breast cancer Sister    ALS Sister    COPD Brother    Pancreatic cancer Brother    Diabetes Brother    Cirrhosis Son    Seizures Son     Social History: Social History   Tobacco Use  Smoking Status Former   Current packs/day: 0.00   Average packs/day: 1 pack/day for 30.0 years (30.0 ttl pk-yrs)   Types: Cigarettes   Start date: 05/22/1959   Quit date: 05/21/1989   Years since quitting: 35.2   Passive exposure: Never  Smokeless Tobacco Never   Social History   Substance and Sexual Activity  Alcohol  Use Yes   Alcohol /week: 3.0 standard  drinks of alcohol    Types: 3 Standard drinks or equivalent per week   Comment: 1 glass of liquor, 3 days a week   Social History   Substance and Sexual Activity  Drug Use No    Allergies: No Known Allergies  Medications: Current Outpatient Medications  Medication Sig Dispense Refill   allopurinol  (ZYLOPRIM ) 100 MG tablet TAKE 1/2 TABLET EVERY OTHER DAY 22 tablet 3   amLODipine  (NORVASC ) 5 MG tablet TAKE 1 TABLET EVERY DAY 90 tablet 3   aspirin  EC 81 MG tablet Take 81 mg by mouth in the morning. Swallow whole.     calcitRIOL  (ROCALTROL ) 0.25 MCG capsule Take 0.25 mcg by mouth every Monday, Wednesday, and Friday. At dialysis     clopidogrel  (PLAVIX ) 75 MG tablet Take 1 tablet (75 mg total) by mouth daily. 90 tablet 3   Dupilumab  (DUPIXENT ) 300 MG/2ML SOAJ Inject 300 mg into the skin every 14 (fourteen) days.  Starting at day 15 for maintenance. 12 mL 3   folic acid  (FOLVITE ) 400 MCG tablet Take 400 mcg by mouth in the morning.     leflunomide  (ARAVA ) 10 MG tablet TAKE 1 TABLET EVERY DAY 90 tablet 0   Leuprolide  Acetate (ELIGARD  [place]) Inject 1 Dose into the skin every 4 (four) months.     mupirocin  ointment (BACTROBAN ) 2 % Apply 1 Application topically 2 (two) times daily. (Patient taking differently: Apply 1 Application topically 2 (two) times daily. As needed) 22 g 0   pantoprazole  (PROTONIX ) 40 MG tablet TAKE 1 TABLET TWICE DAILY 180 tablet 3   RENVELA  800 MG tablet Take 800 mg by mouth 2 (two) times daily with a meal.     rosuvastatin  (CRESTOR ) 40 MG tablet TAKE 1 TABLET EVERY DAY 90 tablet 1   triamcinolone  cream (KENALOG ) 0.1 % Apply 1 Application topically daily as needed (irritation).     No current facility-administered medications for this visit.    Review of Systems: GENERAL: negative for malaise, night sweats HEENT: No changes in hearing or vision, no nose bleeds or other nasal problems. NECK: Negative for lumps, goiter, pain and significant neck swelling RESPIRATORY:  Negative for cough, wheezing CARDIOVASCULAR: Negative for chest pain, leg swelling, palpitations, orthopnea GI: SEE HPI MUSCULOSKELETAL: Negative for joint pain or swelling, back pain, and muscle pain. SKIN: Negative for lesions, rash PSYCH: Negative for sleep disturbance, mood disorder and recent psychosocial stressors. HEMATOLOGY Negative for prolonged bleeding, bruising easily, and swollen nodes. ENDOCRINE: Negative for cold or heat intolerance, polyuria, polydipsia and goiter. NEURO: negative for tremor, gait imbalance, syncope and seizures. The remainder of the review of systems is noncontributory.   Physical Exam: BP (!) 158/73 (BP Location: Right Arm, Patient Position: Sitting, Cuff Size: Normal)   Pulse 66   Temp 98.3 F (36.8 C) (Temporal)   Ht 5' 10 (1.778 m)   Wt 151 lb (68.5 kg)   BMI 21.67 kg/m  GENERAL: The patient is AO x3, in no acute distress. HEENT: Head is normocephalic and atraumatic. EOMI are intact. Mouth is well hydrated and without lesions. NECK: Supple. No masses LUNGS: Clear to auscultation. No presence of rhonchi/wheezing/rales. Adequate chest expansion HEART: RRR, normal s1 and s2. ABDOMEN: Soft, nontender, no guarding, no peritoneal signs, and nondistended. BS +. No masses. EXTREMITIES: Without any cyanosis, clubbing, rash, lesions or edema. NEUROLOGIC: AOx3, no focal motor deficit. SKIN: no jaundice, no rashes  Imaging/Labs: as above  I personally reviewed and interpreted the available labs, imaging and endoscopic files.  Impression and Plan: TANIELA FELTUS is a 78 y.o. 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  Patient  has done relatively well after his endoscopic intervention where he was found to have some jejunal ulcers that were clipped.  He denies having any symptoms or complaints.  His hemoglobin has remained relatively stable and in an acceptable range for a patient has end-stage renal disease.  As he has not presented any signs of although or gastrointestinal bleeding, we discussed the possibility of decreasing his PPI dosage to once a day dosing, which he is open to try.  Will also check CBC and iron  stores in 3 months to make sure  there are stable.  -Decrease pantoprazole  to 40 mg every day -Check CBC and iron  stores in 3 months  All questions were answered.      Jacob Fortune, MD Gastroenterology and Hepatology [facility] Gastroenterology

## 2024-08-13 DIAGNOSIS — N2581 Secondary hyperparathyroidism of renal origin: Secondary | ICD-10-CM | POA: Diagnosis not present

## 2024-08-13 DIAGNOSIS — N186 End stage renal disease: Secondary | ICD-10-CM | POA: Diagnosis not present

## 2024-08-13 DIAGNOSIS — Z992 Dependence on renal dialysis: Secondary | ICD-10-CM | POA: Diagnosis not present

## 2024-08-16 DIAGNOSIS — N2581 Secondary hyperparathyroidism of renal origin: Secondary | ICD-10-CM | POA: Diagnosis not present

## 2024-08-16 DIAGNOSIS — N186 End stage renal disease: Secondary | ICD-10-CM | POA: Diagnosis not present

## 2024-08-16 DIAGNOSIS — Z992 Dependence on renal dialysis: Secondary | ICD-10-CM | POA: Diagnosis not present

## 2024-08-18 DIAGNOSIS — N2581 Secondary hyperparathyroidism of renal origin: Secondary | ICD-10-CM | POA: Diagnosis not present

## 2024-08-18 DIAGNOSIS — Z992 Dependence on renal dialysis: Secondary | ICD-10-CM | POA: Diagnosis not present

## 2024-08-18 DIAGNOSIS — N186 End stage renal disease: Secondary | ICD-10-CM | POA: Diagnosis not present

## 2024-08-19 ENCOUNTER — Ambulatory Visit (INDEPENDENT_AMBULATORY_CARE_PROVIDER_SITE_OTHER): Admitting: Podiatry

## 2024-08-19 VITALS — Ht 70.0 in | Wt 151.0 lb

## 2024-08-19 DIAGNOSIS — I739 Peripheral vascular disease, unspecified: Secondary | ICD-10-CM

## 2024-08-19 DIAGNOSIS — L97512 Non-pressure chronic ulcer of other part of right foot with fat layer exposed: Secondary | ICD-10-CM

## 2024-08-19 NOTE — Progress Notes (Signed)
  Subjective:  Patient ID: Jacob Farmer, male    DOB: [birth year],  MRN: [id number]  Chief Complaint  Patient presents with   Foot Ulcer    Rm 9 Patient is for circulation test results, ulcer on right heel, and left 4th toe nail discoloration.     78 y.o. male presents with the above complaint. History confirmed with patient.   Objective:  Physical Exam: Foot is relatively warm pulses are nonpalpable he has a full-thickness ulcer on the plantar medial heel measuring 0.4 x 0.4 x 0.3 cm.  Stable subtle hematoma of the left third toenail without loosening of nail  ABI 0.86 on the right with monophasic waveform and great toe pressure of 63 TBI of 0.38    Assessment:   No diagnosis found.    Plan:  Patient was evaluated and treated and all questions answered.  Ulcer right heel -We discussed the etiology and factors that are a part of the wound healing process.  We also discussed the risk of infection both soft tissue and osteomyelitis from open ulceration.  Discussed the risk of limb loss if this happens or worsens. -Debridement as below. -Dressed with antibiotic ointment, DSD. -Continue home dressing changes daily with bandaid-type dressing and gentamicin  ointment, has not been compliant with his and encourages compliance to maintain a moist wound healing environment -Vascular testing reviewed and his ABI is mildly depressed with likely infrapopliteal with small vessel disease.  His wound is healing well and we discussed that if this stagnates or worsens at any point I would recommend vascular consultation for angiography.  He agrees with this and would like to hold off on seeing them for now as long as healing okay.   Procedure: Excisional Debridement of Wound Rationale: Removal of non-viable soft tissue from the wound to promote healing.  Anesthesia: none Post-Debridement Wound Measurements: Noted above Type of Debridement: Sharp Excisional Tissue Removed: Non-viable soft  tissue Depth of Debridement: subcutaneous tissue. Technique: Sharp excisional debridement to bleeding, viable wound base.  Dressing: Dry, sterile, compression dressing. Disposition: Patient tolerated procedure well.       No follow-ups on file.

## 2024-08-20 DIAGNOSIS — Z992 Dependence on renal dialysis: Secondary | ICD-10-CM | POA: Diagnosis not present

## 2024-08-20 DIAGNOSIS — N186 End stage renal disease: Secondary | ICD-10-CM | POA: Diagnosis not present

## 2024-08-20 DIAGNOSIS — N2581 Secondary hyperparathyroidism of renal origin: Secondary | ICD-10-CM | POA: Diagnosis not present

## 2024-08-23 DIAGNOSIS — N186 End stage renal disease: Secondary | ICD-10-CM | POA: Diagnosis not present

## 2024-08-23 DIAGNOSIS — N2581 Secondary hyperparathyroidism of renal origin: Secondary | ICD-10-CM | POA: Diagnosis not present

## 2024-08-23 DIAGNOSIS — Z992 Dependence on renal dialysis: Secondary | ICD-10-CM | POA: Diagnosis not present

## 2024-08-25 DIAGNOSIS — N186 End stage renal disease: Secondary | ICD-10-CM | POA: Diagnosis not present

## 2024-08-25 DIAGNOSIS — Z992 Dependence on renal dialysis: Secondary | ICD-10-CM | POA: Diagnosis not present

## 2024-08-25 DIAGNOSIS — N2581 Secondary hyperparathyroidism of renal origin: Secondary | ICD-10-CM | POA: Diagnosis not present

## 2024-08-27 DIAGNOSIS — Z992 Dependence on renal dialysis: Secondary | ICD-10-CM | POA: Diagnosis not present

## 2024-08-27 DIAGNOSIS — N2581 Secondary hyperparathyroidism of renal origin: Secondary | ICD-10-CM | POA: Diagnosis not present

## 2024-08-27 DIAGNOSIS — N186 End stage renal disease: Secondary | ICD-10-CM | POA: Diagnosis not present

## 2024-08-30 DIAGNOSIS — N2581 Secondary hyperparathyroidism of renal origin: Secondary | ICD-10-CM | POA: Diagnosis not present

## 2024-08-30 DIAGNOSIS — N186 End stage renal disease: Secondary | ICD-10-CM | POA: Diagnosis not present

## 2024-08-30 DIAGNOSIS — Z992 Dependence on renal dialysis: Secondary | ICD-10-CM | POA: Diagnosis not present

## 2024-09-01 ENCOUNTER — Other Ambulatory Visit: Payer: Self-pay | Admitting: *Deleted

## 2024-09-01 DIAGNOSIS — Z992 Dependence on renal dialysis: Secondary | ICD-10-CM | POA: Diagnosis not present

## 2024-09-01 DIAGNOSIS — M0600 Rheumatoid arthritis without rheumatoid factor, unspecified site: Secondary | ICD-10-CM

## 2024-09-01 DIAGNOSIS — N186 End stage renal disease: Secondary | ICD-10-CM | POA: Diagnosis not present

## 2024-09-01 DIAGNOSIS — N2581 Secondary hyperparathyroidism of renal origin: Secondary | ICD-10-CM | POA: Diagnosis not present

## 2024-09-01 MED ORDER — LEFLUNOMIDE 10 MG PO TABS: 10.0000 mg | ORAL_TABLET | Freq: Every day | ORAL | 0 refills | Status: DC

## 2024-09-01 MED ORDER — ALLOPURINOL 100 MG PO TABS: 50.0000 mg | ORAL_TABLET | ORAL | 0 refills | Status: DC

## 2024-09-01 NOTE — Telephone Encounter (Signed)
 Patient's wife contacted the office to request refills on Arava  and Allopurinol  to be sent to [facility].   Last Fill: 04/26/2024  Labs: 06/17/2024 RBC 3.66, Hgb 10.8, Hct 35.1, Sodium 132, Potassium 3.2, Chloride 92, Creat 3.45, GFR 17, Calcium  8.7  Next Visit: 10/21/2024  Last Visit: 05/18/2024  DX: Idiopathic chronic gout of multiple sites without tophus, Seronegative rheumatoid arthritis   Current Dose per office note 05/18/2024: allopurinol  50 mg every other day-  Arava  10 mg 1 tablet by mouth daily.   Okay to refill Allopurinol  and Arava ?

## 2024-09-02 ENCOUNTER — Ambulatory Visit (HOSPITAL_COMMUNITY)
Admission: RE | Admit: 2024-09-02 | Discharge: 2024-09-02 | Disposition: A | Discharge status: Home / Self Care | Payer: Self-pay | Source: Ambulatory Visit | Attending: Internal Medicine | Admitting: Internal Medicine

## 2024-09-02 DIAGNOSIS — I517 Cardiomegaly: Secondary | ICD-10-CM | POA: Insufficient documentation

## 2024-09-02 DIAGNOSIS — Z8719 Personal history of other diseases of the digestive system: Secondary | ICD-10-CM | POA: Diagnosis not present

## 2024-09-02 DIAGNOSIS — I480 Paroxysmal atrial fibrillation: Secondary | ICD-10-CM | POA: Diagnosis not present

## 2024-09-02 LAB — POCT I-STAT CREATININE: Creatinine, Ser: 3.5 mg/dL — ABNORMAL HIGH (ref 0.61–1.24)

## 2024-09-02 MED ORDER — IOHEXOL 350 MG/ML SOLN
80.0000 mL | Freq: Once | INTRAVENOUS | Status: AC | PRN
Start: 2024-09-02 — End: 2024-09-02
  Administered 2024-09-02: 80 mL via INTRAVENOUS

## 2024-09-03 DIAGNOSIS — N2581 Secondary hyperparathyroidism of renal origin: Secondary | ICD-10-CM | POA: Diagnosis not present

## 2024-09-03 DIAGNOSIS — Z992 Dependence on renal dialysis: Secondary | ICD-10-CM | POA: Diagnosis not present

## 2024-09-03 DIAGNOSIS — N186 End stage renal disease: Secondary | ICD-10-CM | POA: Diagnosis not present

## 2024-09-12 ENCOUNTER — Ambulatory Visit: Payer: Self-pay | Admitting: Cardiology

## 2024-09-21 ENCOUNTER — Ambulatory Visit: Admitting: Podiatry

## 2024-09-29 ENCOUNTER — Encounter (INDEPENDENT_AMBULATORY_CARE_PROVIDER_SITE_OTHER): Payer: Self-pay | Admitting: Gastroenterology

## 2024-09-30 ENCOUNTER — Other Ambulatory Visit

## 2024-10-05 ENCOUNTER — Ambulatory Visit: Admitting: Podiatry

## 2024-10-05 ENCOUNTER — Ambulatory Visit: Admitting: Urology

## 2024-10-07 ENCOUNTER — Ambulatory Visit: Payer: Self-pay | Admitting: Cardiology

## 2024-10-08 ENCOUNTER — Telehealth: Payer: Self-pay

## 2024-10-08 NOTE — Telephone Encounter (Signed)
 Spoke with patient's wife attempting to schedule 6 month post watchman. She informed that patient is now on hospice. He decided to stop dialysis and initiated hospice.

## 2024-10-19 ENCOUNTER — Ambulatory Visit: Admitting: Physician Assistant

## 2024-10-21 ENCOUNTER — Ambulatory Visit: Admitting: Rheumatology

## 2024-11-15 DEATH — deceased

## 2024-11-16 ENCOUNTER — Ambulatory Visit: Payer: Medicare HMO

## 2024-12-21 ENCOUNTER — Ambulatory Visit: Admitting: Internal Medicine

## 2025-01-13 ENCOUNTER — Ambulatory Visit: Payer: Medicare HMO | Admitting: Dermatology
